# Patient Record
Sex: Male | Born: 1960 | Race: Black or African American | Hispanic: No | Marital: Single | Smoking: Never smoker
Health system: Southern US, Community
[De-identification: ages and names within clinical notes are randomized; demographics above are authoritative.]

## PROBLEM LIST (undated history)

## (undated) ENCOUNTER — Ambulatory Visit (HOSPITAL_COMMUNITY): Admission: EM | Payer: Medicare Other | Source: Home / Self Care

## (undated) DIAGNOSIS — K449 Diaphragmatic hernia without obstruction or gangrene: Secondary | ICD-10-CM

## (undated) DIAGNOSIS — I639 Cerebral infarction, unspecified: Secondary | ICD-10-CM

## (undated) DIAGNOSIS — E785 Hyperlipidemia, unspecified: Secondary | ICD-10-CM

## (undated) DIAGNOSIS — I10 Essential (primary) hypertension: Secondary | ICD-10-CM

## (undated) DIAGNOSIS — F32A Depression, unspecified: Secondary | ICD-10-CM

## (undated) DIAGNOSIS — K219 Gastro-esophageal reflux disease without esophagitis: Secondary | ICD-10-CM

## (undated) DIAGNOSIS — K5792 Diverticulitis of intestine, part unspecified, without perforation or abscess without bleeding: Secondary | ICD-10-CM

## (undated) DIAGNOSIS — C801 Malignant (primary) neoplasm, unspecified: Secondary | ICD-10-CM

## (undated) DIAGNOSIS — F329 Major depressive disorder, single episode, unspecified: Secondary | ICD-10-CM

## (undated) DIAGNOSIS — F319 Bipolar disorder, unspecified: Secondary | ICD-10-CM

## (undated) DIAGNOSIS — Z973 Presence of spectacles and contact lenses: Secondary | ICD-10-CM

## (undated) DIAGNOSIS — D649 Anemia, unspecified: Secondary | ICD-10-CM

## (undated) DIAGNOSIS — F840 Autistic disorder: Secondary | ICD-10-CM

## (undated) DIAGNOSIS — R569 Unspecified convulsions: Secondary | ICD-10-CM

## (undated) DIAGNOSIS — N183 Chronic kidney disease, stage 3 unspecified: Secondary | ICD-10-CM

## (undated) DIAGNOSIS — D49519 Neoplasm of unspecified behavior of unspecified kidney: Secondary | ICD-10-CM

## (undated) DIAGNOSIS — N529 Male erectile dysfunction, unspecified: Secondary | ICD-10-CM

## (undated) DIAGNOSIS — F419 Anxiety disorder, unspecified: Secondary | ICD-10-CM

## (undated) DIAGNOSIS — I509 Heart failure, unspecified: Secondary | ICD-10-CM

## (undated) DIAGNOSIS — I129 Hypertensive chronic kidney disease with stage 1 through stage 4 chronic kidney disease, or unspecified chronic kidney disease: Secondary | ICD-10-CM

## (undated) DIAGNOSIS — M1990 Unspecified osteoarthritis, unspecified site: Secondary | ICD-10-CM

## (undated) DIAGNOSIS — E079 Disorder of thyroid, unspecified: Secondary | ICD-10-CM

## (undated) DIAGNOSIS — T7840XA Allergy, unspecified, initial encounter: Secondary | ICD-10-CM

## (undated) DIAGNOSIS — G4730 Sleep apnea, unspecified: Secondary | ICD-10-CM

## (undated) HISTORY — DX: Unspecified convulsions: R56.9

## (undated) HISTORY — DX: Chronic kidney disease, stage 3 unspecified: N18.30

## (undated) HISTORY — DX: Hyperlipidemia, unspecified: E78.5

## (undated) HISTORY — DX: Unspecified osteoarthritis, unspecified site: M19.90

## (undated) HISTORY — PX: COLONOSCOPY: SHX174

## (undated) HISTORY — DX: Major depressive disorder, single episode, unspecified: F32.9

## (undated) HISTORY — DX: Gastro-esophageal reflux disease without esophagitis: K21.9

## (undated) HISTORY — DX: Cerebral infarction, unspecified: I63.9

## (undated) HISTORY — DX: Sleep apnea, unspecified: G47.30

## (undated) HISTORY — DX: Male erectile dysfunction, unspecified: N52.9

## (undated) HISTORY — DX: Anxiety disorder, unspecified: F41.9

## (undated) HISTORY — DX: Allergy, unspecified, initial encounter: T78.40XA

## (undated) HISTORY — DX: Depression, unspecified: F32.A

## (undated) HISTORY — DX: Presence of spectacles and contact lenses: Z97.3

## (undated) HISTORY — PX: KIDNEY SURGERY: SHX687

## (undated) HISTORY — DX: Diaphragmatic hernia without obstruction or gangrene: K44.9

## (undated) HISTORY — DX: Disorder of thyroid, unspecified: E07.9

## (undated) HISTORY — DX: Hypertensive chronic kidney disease with stage 1 through stage 4 chronic kidney disease, or unspecified chronic kidney disease: I12.9

---

## 2010-08-18 ENCOUNTER — Emergency Department (HOSPITAL_COMMUNITY): Admission: EM | Admit: 2010-08-18 | Discharge: 2010-08-18 | Payer: Self-pay | Admitting: Emergency Medicine

## 2011-02-17 LAB — URINALYSIS, ROUTINE W REFLEX MICROSCOPIC
Bilirubin Urine: NEGATIVE
Leukocytes, UA: NEGATIVE
Nitrite: NEGATIVE
Specific Gravity, Urine: 1.042 — ABNORMAL HIGH (ref 1.005–1.030)
pH: 5.5 (ref 5.0–8.0)

## 2011-02-17 LAB — URINE MICROSCOPIC-ADD ON

## 2011-02-17 LAB — POCT I-STAT, CHEM 8
BUN: 10 mg/dL (ref 6–23)
Creatinine, Ser: 0.9 mg/dL (ref 0.4–1.5)
Potassium: 3.7 mEq/L (ref 3.5–5.1)
Sodium: 143 mEq/L (ref 135–145)

## 2011-02-17 LAB — GLUCOSE, CAPILLARY: Glucose-Capillary: 437 mg/dL — ABNORMAL HIGH (ref 70–99)

## 2011-07-25 ENCOUNTER — Emergency Department (HOSPITAL_COMMUNITY): Payer: Self-pay

## 2011-07-25 ENCOUNTER — Inpatient Hospital Stay (HOSPITAL_COMMUNITY)
Admission: EM | Admit: 2011-07-25 | Discharge: 2011-08-06 | DRG: 391 | Disposition: A | Discharge status: Home / Self Care | Payer: Self-pay | Attending: Surgery | Admitting: Surgery

## 2011-07-25 DIAGNOSIS — N289 Disorder of kidney and ureter, unspecified: Secondary | ICD-10-CM | POA: Diagnosis present

## 2011-07-25 DIAGNOSIS — Z794 Long term (current) use of insulin: Secondary | ICD-10-CM

## 2011-07-25 DIAGNOSIS — K5732 Diverticulitis of large intestine without perforation or abscess without bleeding: Principal | ICD-10-CM | POA: Diagnosis present

## 2011-07-25 DIAGNOSIS — N2889 Other specified disorders of kidney and ureter: Secondary | ICD-10-CM

## 2011-07-25 DIAGNOSIS — R17 Unspecified jaundice: Secondary | ICD-10-CM | POA: Diagnosis present

## 2011-07-25 DIAGNOSIS — E041 Nontoxic single thyroid nodule: Secondary | ICD-10-CM | POA: Diagnosis present

## 2011-07-25 DIAGNOSIS — D649 Anemia, unspecified: Secondary | ICD-10-CM | POA: Diagnosis present

## 2011-07-25 DIAGNOSIS — R197 Diarrhea, unspecified: Secondary | ICD-10-CM | POA: Diagnosis present

## 2011-07-25 DIAGNOSIS — I10 Essential (primary) hypertension: Secondary | ICD-10-CM | POA: Diagnosis present

## 2011-07-25 DIAGNOSIS — R112 Nausea with vomiting, unspecified: Secondary | ICD-10-CM | POA: Diagnosis present

## 2011-07-25 DIAGNOSIS — E1310 Other specified diabetes mellitus with ketoacidosis without coma: Secondary | ICD-10-CM | POA: Diagnosis present

## 2011-07-25 DIAGNOSIS — E876 Hypokalemia: Secondary | ICD-10-CM | POA: Diagnosis not present

## 2011-07-25 HISTORY — DX: Essential (primary) hypertension: I10

## 2011-07-25 LAB — CBC
Hemoglobin: 14.3 g/dL (ref 13.0–17.0)
MCH: 31.3 pg (ref 26.0–34.0)
MCV: 86 fL (ref 78.0–100.0)
RBC: 4.57 MIL/uL (ref 4.22–5.81)

## 2011-07-25 LAB — DIFFERENTIAL
Lymphs Abs: 2.4 10*3/uL (ref 0.7–4.0)
Monocytes Relative: 7 % (ref 3–12)
Neutro Abs: 15.4 10*3/uL — ABNORMAL HIGH (ref 1.7–7.7)
Neutrophils Relative %: 80 % — ABNORMAL HIGH (ref 43–77)

## 2011-07-25 LAB — BASIC METABOLIC PANEL
CO2: 14 mEq/L — ABNORMAL LOW (ref 19–32)
Calcium: 10.4 mg/dL (ref 8.4–10.5)
Creatinine, Ser: 1.06 mg/dL (ref 0.50–1.35)
Glucose, Bld: 456 mg/dL — ABNORMAL HIGH (ref 70–99)
Sodium: 133 mEq/L — ABNORMAL LOW (ref 135–145)

## 2011-07-26 ENCOUNTER — Encounter (HOSPITAL_COMMUNITY): Payer: Self-pay | Admitting: Radiology

## 2011-07-26 DIAGNOSIS — E119 Type 2 diabetes mellitus without complications: Secondary | ICD-10-CM

## 2011-07-26 DIAGNOSIS — K5732 Diverticulitis of large intestine without perforation or abscess without bleeding: Secondary | ICD-10-CM

## 2011-07-26 LAB — GLUCOSE, CAPILLARY
Glucose-Capillary: 387 mg/dL — ABNORMAL HIGH (ref 70–99)
Glucose-Capillary: 316 mg/dL — ABNORMAL HIGH (ref 70–99)
Glucose-Capillary: 217 mg/dL — ABNORMAL HIGH (ref 70–99)
Glucose-Capillary: 196 mg/dL — ABNORMAL HIGH (ref 70–99)
Glucose-Capillary: 194 mg/dL — ABNORMAL HIGH (ref 70–99)
Glucose-Capillary: 160 mg/dL — ABNORMAL HIGH (ref 70–99)
Glucose-Capillary: 135 mg/dL — ABNORMAL HIGH (ref 70–99)

## 2011-07-26 LAB — LIPASE, BLOOD: Lipase: 19 U/L (ref 11–59)

## 2011-07-26 LAB — CBC
MCH: 29.2 pg (ref 26.0–34.0)
MCHC: 34 g/dL (ref 30.0–36.0)
MCV: 85.9 fL (ref 78.0–100.0)
Platelets: 218 10*3/uL (ref 150–400)
RDW: 11.6 % (ref 11.5–15.5)
WBC: 13.1 10*3/uL — ABNORMAL HIGH (ref 4.0–10.5)

## 2011-07-26 LAB — BASIC METABOLIC PANEL
CO2: 25 mEq/L (ref 19–32)
Calcium: 9.5 mg/dL (ref 8.4–10.5)
Chloride: 105 mEq/L (ref 96–112)
Glucose, Bld: 132 mg/dL — ABNORMAL HIGH (ref 70–99)
Sodium: 138 mEq/L (ref 135–145)

## 2011-07-26 LAB — URINE MICROSCOPIC-ADD ON

## 2011-07-26 LAB — COMPREHENSIVE METABOLIC PANEL
BUN: 13 mg/dL (ref 6–23)
CO2: 24 mEq/L (ref 19–32)
Calcium: 9.6 mg/dL (ref 8.4–10.5)
Creatinine, Ser: 0.79 mg/dL (ref 0.50–1.35)
GFR calc Af Amer: >60 mL/min (ref >60)
GFR calc non Af Amer: >60 mL/min (ref >60)
Glucose, Bld: 220 mg/dL — ABNORMAL HIGH (ref 70–99)
Total Bilirubin: 1.6 mg/dL — ABNORMAL HIGH (ref 0.3–1.2)

## 2011-07-26 LAB — URINALYSIS, ROUTINE W REFLEX MICROSCOPIC
Ketones, ur: >80 mg/dL — AB
Ketones, ur: >80 mg/dL — AB
Leukocytes, UA: NEGATIVE
Leukocytes, UA: NEGATIVE
Nitrite: NEGATIVE
Nitrite: NEGATIVE
Protein, ur: 30 mg/dL — AB
Protein, ur: 30 mg/dL — AB

## 2011-07-26 LAB — HEPATIC FUNCTION PANEL
ALT: 9 U/L (ref 0–53)
Albumin: 3.7 g/dL (ref 3.5–5.2)
Alkaline Phosphatase: 122 U/L — ABNORMAL HIGH (ref 39–117)
Total Bilirubin: 2 mg/dL — ABNORMAL HIGH (ref 0.3–1.2)

## 2011-07-26 LAB — DIFFERENTIAL
Eosinophils Absolute: 0.1 10*3/uL (ref 0.0–0.7)
Eosinophils Relative: 1 % (ref 0–5)
Lymphs Abs: 1.2 10*3/uL (ref 0.7–4.0)
Monocytes Absolute: 0.7 10*3/uL (ref 0.1–1.0)

## 2011-07-26 LAB — BILIRUBIN, FRACTIONATED(TOT/DIR/INDIR): Indirect Bilirubin: 1.4 mg/dL — ABNORMAL HIGH (ref 0.3–0.9)

## 2011-07-26 LAB — LACTATE DEHYDROGENASE: LDH: 249 U/L (ref 94–250)

## 2011-07-26 MED ORDER — IOHEXOL 300 MG/ML  SOLN
100.0000 mL | Freq: Once | INTRAMUSCULAR | Status: AC | PRN
  Administered 2011-07-26: 100 mL via INTRAVENOUS

## 2011-07-27 LAB — PHOSPHORUS: Phosphorus: 2.7 mg/dL (ref 2.3–4.6)

## 2011-07-27 LAB — HEMOGLOBIN A1C
Hgb A1c MFr Bld: 14.1 % — ABNORMAL HIGH (ref <5.7)
Mean Plasma Glucose: 358 mg/dL — ABNORMAL HIGH (ref <117)

## 2011-07-27 LAB — GLUCOSE, CAPILLARY
Glucose-Capillary: 186 mg/dL — ABNORMAL HIGH (ref 70–99)
Glucose-Capillary: 202 mg/dL — ABNORMAL HIGH (ref 70–99)
Glucose-Capillary: 207 mg/dL — ABNORMAL HIGH (ref 70–99)
Glucose-Capillary: 159 mg/dL — ABNORMAL HIGH (ref 70–99)

## 2011-07-27 LAB — CBC
MCV: 87.1 fL (ref 78.0–100.0)
Platelets: 201 10*3/uL (ref 150–400)
RBC: 3.94 MIL/uL — ABNORMAL LOW (ref 4.22–5.81)
RDW: 11.6 % (ref 11.5–15.5)
WBC: 15.2 10*3/uL — ABNORMAL HIGH (ref 4.0–10.5)

## 2011-07-27 LAB — BASIC METABOLIC PANEL WITH GFR
BUN: 8 mg/dL (ref 6–23)
CO2: 20 meq/L (ref 19–32)
Calcium: 9.2 mg/dL (ref 8.4–10.5)
Chloride: 105 meq/L (ref 96–112)
Creatinine, Ser: 0.66 mg/dL (ref 0.50–1.35)
GFR calc Af Amer: >60 mL/min
GFR calc non Af Amer: >60 mL/min
Glucose, Bld: 220 mg/dL — ABNORMAL HIGH (ref 70–99)
Potassium: 3.4 meq/L — ABNORMAL LOW (ref 3.5–5.1)
Sodium: 137 meq/L (ref 135–145)

## 2011-07-27 LAB — HAPTOGLOBIN: Haptoglobin: 418 mg/dL — ABNORMAL HIGH (ref 30–200)

## 2011-07-27 LAB — MAGNESIUM: Magnesium: 2.3 mg/dL (ref 1.5–2.5)

## 2011-07-28 LAB — GLUCOSE, CAPILLARY
Glucose-Capillary: 115 mg/dL — ABNORMAL HIGH (ref 70–99)
Glucose-Capillary: 223 mg/dL — ABNORMAL HIGH (ref 70–99)
Glucose-Capillary: 144 mg/dL — ABNORMAL HIGH (ref 70–99)

## 2011-07-28 LAB — CBC
HCT: 32.7 % — ABNORMAL LOW (ref 39.0–52.0)
Hemoglobin: 11.4 g/dL — ABNORMAL LOW (ref 13.0–17.0)
MCV: 86.7 fL (ref 78.0–100.0)
Platelets: 214 10*3/uL (ref 150–400)
RBC: 3.77 MIL/uL — ABNORMAL LOW (ref 4.22–5.81)
WBC: 9.7 10*3/uL (ref 4.0–10.5)

## 2011-07-28 LAB — RETICULOCYTES: Retic Count, Absolute: 30.2 10*3/uL (ref 19.0–186.0)

## 2011-07-28 LAB — HEPATIC FUNCTION PANEL
Alkaline Phosphatase: 100 U/L (ref 39–117)
Indirect Bilirubin: 0.6 mg/dL (ref 0.3–0.9)
Total Protein: 6.5 g/dL (ref 6.0–8.3)

## 2011-07-28 LAB — BASIC METABOLIC PANEL
CO2: 22 mEq/L (ref 19–32)
Calcium: 9.1 mg/dL (ref 8.4–10.5)
Creatinine, Ser: 0.6 mg/dL (ref 0.50–1.35)

## 2011-07-28 LAB — PROTIME-INR: Prothrombin Time: 17 seconds — ABNORMAL HIGH (ref 11.6–15.2)

## 2011-07-29 LAB — BASIC METABOLIC PANEL
Calcium: 8.8 mg/dL (ref 8.4–10.5)
Creatinine, Ser: 0.54 mg/dL (ref 0.50–1.35)
GFR calc Af Amer: >60 mL/min (ref >60)

## 2011-07-29 LAB — GLUCOSE, CAPILLARY
Glucose-Capillary: 102 mg/dL — ABNORMAL HIGH (ref 70–99)
Glucose-Capillary: 125 mg/dL — ABNORMAL HIGH (ref 70–99)
Glucose-Capillary: 132 mg/dL — ABNORMAL HIGH (ref 70–99)
Glucose-Capillary: 156 mg/dL — ABNORMAL HIGH (ref 70–99)

## 2011-07-29 LAB — CBC
MCH: 29.9 pg (ref 26.0–34.0)
MCHC: 34.9 g/dL (ref 30.0–36.0)
MCV: 85.6 fL (ref 78.0–100.0)
Platelets: 218 10*3/uL (ref 150–400)
RDW: 11.5 % (ref 11.5–15.5)
WBC: 8 10*3/uL (ref 4.0–10.5)

## 2011-07-30 LAB — GLUCOSE, CAPILLARY
Glucose-Capillary: 164 mg/dL — ABNORMAL HIGH (ref 70–99)
Glucose-Capillary: 115 mg/dL — ABNORMAL HIGH (ref 70–99)
Glucose-Capillary: 173 mg/dL — ABNORMAL HIGH (ref 70–99)

## 2011-07-30 LAB — BASIC METABOLIC PANEL
CO2: 24 mEq/L (ref 19–32)
Chloride: 103 mEq/L (ref 96–112)
Creatinine, Ser: 0.55 mg/dL (ref 0.50–1.35)
Sodium: 136 mEq/L (ref 135–145)

## 2011-07-31 LAB — GLUCOSE, CAPILLARY
Glucose-Capillary: 142 mg/dL — ABNORMAL HIGH (ref 70–99)
Glucose-Capillary: 172 mg/dL — ABNORMAL HIGH (ref 70–99)
Glucose-Capillary: 191 mg/dL — ABNORMAL HIGH (ref 70–99)
Glucose-Capillary: 209 mg/dL — ABNORMAL HIGH (ref 70–99)

## 2011-08-01 LAB — GLUCOSE, CAPILLARY
Glucose-Capillary: 104 mg/dL — ABNORMAL HIGH (ref 70–99)
Glucose-Capillary: 121 mg/dL — ABNORMAL HIGH (ref 70–99)
Glucose-Capillary: 136 mg/dL — ABNORMAL HIGH (ref 70–99)
Glucose-Capillary: 206 mg/dL — ABNORMAL HIGH (ref 70–99)
Glucose-Capillary: 244 mg/dL — ABNORMAL HIGH (ref 70–99)

## 2011-08-01 LAB — CBC
Hemoglobin: 11.3 g/dL — ABNORMAL LOW (ref 13.0–17.0)
MCH: 30.2 pg (ref 26.0–34.0)
MCHC: 35 g/dL (ref 30.0–36.0)
Platelets: 280 10*3/uL (ref 150–400)

## 2011-08-01 LAB — CULTURE, BLOOD (ROUTINE X 2): Culture  Setup Time: 201208211224

## 2011-08-02 ENCOUNTER — Inpatient Hospital Stay (HOSPITAL_COMMUNITY): Payer: Self-pay

## 2011-08-02 LAB — GLUCOSE, CAPILLARY
Glucose-Capillary: 187 mg/dL — ABNORMAL HIGH (ref 70–99)
Glucose-Capillary: 287 mg/dL — ABNORMAL HIGH (ref 70–99)

## 2011-08-02 MED ORDER — IOHEXOL 300 MG/ML  SOLN
80.0000 mL | Freq: Once | INTRAMUSCULAR | Status: AC | PRN
  Administered 2011-08-02: 80 mL via INTRAVENOUS

## 2011-08-03 ENCOUNTER — Inpatient Hospital Stay (HOSPITAL_COMMUNITY): Payer: Self-pay

## 2011-08-03 LAB — CBC
MCHC: 35.8 g/dL (ref 30.0–36.0)
Platelets: 388 10*3/uL (ref 150–400)
RDW: 11.3 % — ABNORMAL LOW (ref 11.5–15.5)
WBC: 6.4 10*3/uL (ref 4.0–10.5)

## 2011-08-03 LAB — BASIC METABOLIC PANEL
Chloride: 96 mEq/L (ref 96–112)
GFR calc Af Amer: >60 mL/min (ref >60)
GFR calc non Af Amer: >60 mL/min (ref >60)
Potassium: 3.8 mEq/L (ref 3.5–5.1)
Sodium: 134 mEq/L — ABNORMAL LOW (ref 135–145)

## 2011-08-03 LAB — GLUCOSE, CAPILLARY
Glucose-Capillary: 251 mg/dL — ABNORMAL HIGH (ref 70–99)
Glucose-Capillary: 169 mg/dL — ABNORMAL HIGH (ref 70–99)
Glucose-Capillary: 229 mg/dL — ABNORMAL HIGH (ref 70–99)
Glucose-Capillary: 275 mg/dL — ABNORMAL HIGH (ref 70–99)

## 2011-08-03 MED ORDER — GADOBENATE DIMEGLUMINE 529 MG/ML IV SOLN: 20.0000 mL | Freq: Once | INTRAVENOUS | Status: DC

## 2011-08-04 ENCOUNTER — Inpatient Hospital Stay (HOSPITAL_COMMUNITY): Payer: Self-pay

## 2011-08-04 LAB — APTT: aPTT: 38 seconds — ABNORMAL HIGH (ref 24–37)

## 2011-08-04 LAB — GLUCOSE, CAPILLARY
Glucose-Capillary: 218 mg/dL — ABNORMAL HIGH (ref 70–99)
Glucose-Capillary: 174 mg/dL — ABNORMAL HIGH (ref 70–99)
Glucose-Capillary: 220 mg/dL — ABNORMAL HIGH (ref 70–99)

## 2011-08-04 LAB — CREATININE, SERUM
GFR calc Af Amer: >60 mL/min (ref >60)
GFR calc non Af Amer: >60 mL/min (ref >60)

## 2011-08-04 MED ORDER — IOHEXOL 300 MG/ML  SOLN
80.0000 mL | Freq: Once | INTRAMUSCULAR | Status: AC | PRN
  Administered 2011-08-04: 80 mL via INTRAVENOUS

## 2011-08-05 ENCOUNTER — Inpatient Hospital Stay (HOSPITAL_COMMUNITY): Payer: Self-pay

## 2011-08-05 ENCOUNTER — Other Ambulatory Visit (HOSPITAL_COMMUNITY): Payer: Self-pay

## 2011-08-05 LAB — GLUCOSE, CAPILLARY
Glucose-Capillary: 269 mg/dL — ABNORMAL HIGH (ref 70–99)
Glucose-Capillary: 185 mg/dL — ABNORMAL HIGH (ref 70–99)
Glucose-Capillary: 159 mg/dL — ABNORMAL HIGH (ref 70–99)

## 2011-08-05 NOTE — Consult Note (Signed)
NAME:  Brandon [name], Brandon [name] NO.:  [id number]  MEDICAL RECORD NO.:  [id number]  LOCATION:  [id number]                         FACILITY:  [facility]  PHYSICIAN:  Sashay Felling I. Gaynelle [name], Brandon HolmesDATE OF BIRTH:  28-Jun-1961  DATE OF CONSULTATION:  08/03/2011 DATE OF DISCHARGE:                                CONSULTATION   TIME:  2130 hours.  SUBJECTIVE:  Brandon [name] is a 50 year old insulin-dependent diabetic, admitted to the hospital on July 26, 2011, with a ruptured diverticulitis, found to be a microperforation.  He has been managed conservatively, with Cipro, and Flagyl, and IV hydration and support. The patient has a history of a CT scan 1 year ago, as a stone protocol CT, complaining of back pain and urinary frequency.  No stones identified, but the patient was found to have bilateral renal abnormalities, and a pleural-based nodule.  No followup was obtained, and the patient now has CT scan on July 26, 2011, showing a left renal posterior 3.6 x 3.0 heterogeneous mass, which was originally described as "difficult to visualize" in 2011 CT.  On the right side, he has a 2.71 x 2.0 hypodense mass.  During the course, the patient's hospitalization, he underwent followup abdominal CT for his diverticulitis, and chest evaluation shows that the patient has a pleural-based nodule, measuring 10.4 x 4.2 mm on the right side, and was noted to be 9.2 x 4.6 in 2011.  However, repeat examination of the CT abdomen, chest films from 2011, show that the mass is unchanged at 10.4 x 4.2.  In addition, the patient had two 3-mm right-sided pleural-based nodules, which are also unchanged from the 2011 CT.  Followup CT on August 02, 2011, showed that the right renal mass was 2.8 x 2.5 mass in the right mid renal cortex, with arterial enhancement on followup MRI.  On the left side, he has a 13.2 x 2.7 cm medial left lower pole mass with arterial enhancement, and also a cystic component.  SOCIAL  HISTORY:  The patient moved to [place] from [place] in [place].  He is unemployed, has no physician.  He buys his insulin over the counter, and he uses p.r.n. amounts.  The patient lives at home with fiancee.  He has 3 children.  Tobacco is none.  Alcohol is occasional.  REVIEW OF SYSTEMS:  Significant for 50-pound weight loss in 8 months. Review of systems is negative for vision change, headache, loss of consciousness, breathing difficulty, chest pain, cough, gross hematuria, flank pain, skin changes, or neurologic abnormalities.  PAST MEDICAL HISTORY:  Significant for hypertension and diabetes.  MEDICATIONS:  Include Humulin 70/30, 35 units in the morning and 25 in the evening.  ALLERGIES:  None known.  FAMILY HISTORY:  Diabetes, brain aneurysm.  Mother died secondary to aspiration pneumonia.  PHYSICAL EXAMINATION:  GENERAL:  Well-developed African American male, in no acute distress. VITAL SIGNS:  Blood pressure 117/61. NECK:  Supple, nontender.  No nodes. CHEST:  Clear to P and A. ABDOMEN:  Soft, decreased bowel sounds without masses.  There is tenderness to suprapubic palpation. GENITOURINARY:  Normal male external genitalia. EXTREMITIES:  No cyanosis, no edema. PSYCHIATRIC:  Normal psychologic evaluation.  Laboratories show urinalysis  with 0-2 white blood cells, 0-2 red blood cells, and 30 mg of protein.  Laboratories show creatinine 0.74 with GFR greater than 60.  Hemoglobin 13.5.  Platelets 388,000.  ASSESSMENT:  Mr. Gribben is a 50 year old insulin-dependent diabetic, with bilateral renal masses.  The renal masses appeared to be enlarging. The chest nodules appeared to be pleural based, and are unchanged in size and location.  I discussed the case in depth with Radiology, and recommendations would be as follows: 1. Keep the patient in hospital, and proceed with chest CT in a.m.  If     there are no parenchymal nodules, then would proceed to bilateral      renal biopsies percutaneously for diagnosis.  Concern would be for     spreading cancer, or causing bleeding, but diagnosis is important,     because it would be rare to have bilateral synchronous renal     cancers, or spread of cancer from 1 kidney to the other, without     lymph nodes being present.  Therefore, will follow tomorrow with chest CT, and will discuss possible percutaneous biopsy with special procedures.  The patient prefers to stay in-house until biopsies are performed.     Brandon [name], M.D.     SIT/MEDQ  D:  08/03/2011  T:  08/03/2011  Job:  [id number]  Electronically Signed by Brandon [name] M.D. on 08/05/2011 03:12:57 PM

## 2011-08-05 NOTE — H&P (Signed)
NAME:  Brandon [name], Brandon [name] NO.:  [id number]  MEDICAL RECORD NO.:  [id number]  LOCATION:  [facility]                         FACILITY:  [facility]  PHYSICIAN:  Imogene Burn. Georgette Dover, M.D. DATE OF BIRTH:  07-23-1961  DATE OF ADMISSION:  07/25/2011 DATE OF DISCHARGE:                             HISTORY & PHYSICAL   PRIMARY CARE PHYSICIAN:  None.  Previously seen out of town.  CHIEF COMPLAINT:  Abdominal pain.  HISTORY OF PRESENT ILLNESS:  [name] is a 50 year old African American gentleman who began describing his history approximately 4 days ago with some significant fatigue.  He had been out in about at a local event and thought perhaps that he had just done too much activity and was fatigued, however, by the end of the first 48 hours of his onset of fatigue he started developing some fever and chills as well as some lower abdominal discomfort.  He also reports an onset of nausea but was able to tolerate some liquids and though he stated everything that he drank went right through him.  The patient had multiple bouts of diarrhea.  Denies any hematochezia.  He has an underlying history of diabetes and hypertension but not been checking his blood sugars, in fact he has been out of his medicine for some time due to not having an employment at the present time and therefore is unsure what his sugars have been doing recently.  He states that over the recent times, he has also lost about 50 pounds intentionally to aid in his general health and is no longer using any blood pressure medications that he was previously on.  He denies any prior history of intestinal disorders.  He denies any history of chronic constipation.  States his bowel movements are typically regular and soft.  He presented to the emergency department due to this complaint of discomfort and workup in the emergency department found multiple issues including significant uncontrolled diabetes, diabetic ketoacidosis,  and diverticulitis with a micro perforation.  We have been asked to evaluate the patient for his abdominal issues.  The Medical Team has been consulted to manage his diabetes.  PAST MEDICAL HISTORY: 1. Diabetes mellitus. 2. Hypertension. 3. Obesity.  SURGICAL HISTORY:  Negative.  FAMILY HISTORY:  The patient's father passed away from complications of diabetes as well as a brain aneurysm.  His mother passed away I believe from aspiration pneumonia, diabetes and cardiovascular disease is very prominent in his extended family.  SOCIAL HISTORY:  The patient is currently unemployed and was actually planning for a job interview yesterday when the onset of symptoms worsened.  He is engaged and his fiancee is at the bedside with him.  He does have 1 child with his fiancee.  He denies any tobacco or illicit drug use, has an occasional alcoholic beverage.  DRUG ALLERGIES:  No known drug or latex allergies.  MEDICATIONS:  Insulin which he has not been taking.  REVIEW OF SYSTEMS:  Please see history of present illness for pertinent findings, otherwise, complete 12 systems reviewed were negative.  PHYSICAL EXAMINATION:  VITAL SIGNS:  A temperature of 99.2, heart rate of 84, respiratory rate of 20, blood pressure 117/72, oxygen saturation  anywhere from 95-98% on room air. GENERAL:  The patient is a 50 year old obese African American gentleman who is in mild distress. ENT:  Unremarkable. NECK:  Supple without lymphadenopathy.  Trachea is midline.  No thyromegaly or masses. LUNGS:  Clear to auscultation.  No wheezes, rhonchi or rales.  Normal respiratory effort without use of accessory muscles. HEART:  Regular rate and rhythm.  No murmurs, gallops or rubs.  Carotids 2+ and brisk without bruit.  Peripheral pulses intact and symmetrical. ABDOMEN:  No surgical scars.  No obvious hernias.  No organomegaly.  The patient is tender in the left lower quadrant with no peritonitis.  No mass effect  is appreciated. RECTAL:  Deferred. GENITOURINARY:  The patient's testes are bilaterally descended.  No external lesions are seen. EXTREMITIES:  Good active range of motion in all extremities without crepitus or pain.  Muscle strength and tone without atrophy. SKIN:  Otherwise, warm and dry with good turgor.  No rashes, lesions, nodules or jaundice. NEUROLOGIC:  The patient is alert and oriented x3.  Appropriate affect.  DIAGNOSTICS:  CBC today shows a white blood cell count of 19.3, hemoglobin of 14.3, hematocrit of 39.3, platelet count of 248. Metabolic panel shows a sodium of 133, potassium of 4.3, chloride of 92, CO2 of 14, BUN of 13, creatinine of 1.0, glucose of 456.  LFTs show an elevated bilirubin of 2.0, alk phosphatase of 122, AST and ALT of 9, albumin of 3.7.  Lactic acid level normal at 1.0.  Urinalysis essentially negative for pyuria.  IMAGING:  CT scan today shows 2 diverticulitis along the proximal sigmoid colon which contained microperforation into the mesenteric side, small amount of free air noted within the mesentery and trace free fluid noted in the left lower quadrant but no distinct abscess formation. Incidentally, noted a complex multicystic mass noted in both kidneys reportedly increased in size since 2011.  IMPRESSION: 1. Acute sigmoid diverticulitis with micro perforation - contained. 2. Uncontrolled diabetes mellitus. 3. Diabetic ketoacidosis  PLAN:  The patient has already been initiated on IV fluids and insulin drip at present time by the Medical Service.  We will admit the patient for IV antibiotics and bowel rest and management of his diverticulitis. The patient understands that if he does not improve clinically with antibiotics and bowel rest with conservative management that surgical intervention including partial colectomy and colostomy is a distinct possibility.     Ascencion Dike, PA-C   ______________________________ Imogene Burn. Georgette Dover,  M.D.    KB/MEDQ  D:  07/26/2011  T:  07/26/2011  Job:  [id number]  Electronically Signed by Ascencion Dike  on 08/01/2011 12:02:49 PM Electronically Signed by Donnie Mesa M.D. on 08/05/2011 05:05:09 PM

## 2011-08-06 ENCOUNTER — Inpatient Hospital Stay (HOSPITAL_COMMUNITY): Payer: Self-pay

## 2011-08-06 LAB — GLUCOSE, CAPILLARY: Glucose-Capillary: 311 mg/dL — ABNORMAL HIGH (ref 70–99)

## 2011-08-09 LAB — GLUCOSE, CAPILLARY: Glucose-Capillary: 193 mg/dL — ABNORMAL HIGH (ref 70–99)

## 2011-08-10 ENCOUNTER — Ambulatory Visit
Admit: 2011-08-10 | Discharge: 2011-08-10 | Disposition: A | Discharge status: Home / Self Care | Payer: Self-pay | Attending: Urology | Admitting: Urology

## 2011-08-30 ENCOUNTER — Other Ambulatory Visit (HOSPITAL_COMMUNITY): Payer: Self-pay | Admitting: Interventional Radiology

## 2011-08-30 DIAGNOSIS — N2889 Other specified disorders of kidney and ureter: Secondary | ICD-10-CM

## 2011-09-01 NOTE — Discharge Summary (Signed)
  NAME:  ALYSSA, DEUTSCHMAN NO.:  [id number]  MEDICAL RECORD NO.:  [id number]  LOCATION:  [id number]                         FACILITY:  [facility]  PHYSICIAN:  Haywood Lasso, M.D.DATE OF BIRTH:  Nov 09, 1961  DATE OF ADMISSION:  07/25/2011 DATE OF DISCHARGE:  08/06/2011                              DISCHARGE SUMMARY   HISTORY OF PRESENT ILLNESS:  Mr. Linares is a 50 year old gentleman who developed several days of abdominal pain.  He has underlying history of diabetes, hypertension, and obesity.  He presented to the emergency department and workup showed a significantly elevated glucose and abnormal labs consistent with diabetic ketoacidosis.  However, he subsequently also found evidence of sigmoid diverticulitis with evidence of microperforation that is contained.  Decision was made to admit the patient for IV antibiotics and bowel rest with the plan for the Hospitalist Service to manage his diabetes and diabetic ketoacidosis.  [place] COURSE:  The patient was admitted on July 25, 2011.  He made dramatic improvement in the first couple of days of his diabetic ketoacidosis and diabetes management.  He was placed on IV antibiotics and actually waxed and waned for several days with regard to discomfort.  Since he did not completely improve on conservative management, a repeat CT scan was ordered where his diverticulitis was found to be stable, but he was also found to have evidence of a multicystic mass in the kidneys that was seen on the original CT, but now felt to be more suspicious for possible neoplasm.  An MRI was ordered and again found this lesion to be suspicious for renal cell carcinoma.  The Urology Service was consulted and recommended continued workup of this mass on an outpatient basis by means of an interventional radiology biopsy.  He gradually improved from his diverticulitis standpoint.  Subsequently, a CT of the chest was ordered as there  was also apparently found tiny pulmonary nodules.  These were nonspecific. Once the patient was tolerating regular diet and having good bowel function with minimal pain, he was felt to be appropriate for discharge home.  All followup plans were discussed with the patient.  DISCHARGE DIAGNOSES: 1. Sigmoid diverticulitis with microperforation - contained and     improving. 2. 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 3. Incidental finding of lung nodules of uncertain etiology. 4. Diabetes mellitus with an acutely resolved diabetic ketoacidosis.  DISCHARGE MEDICATIONS:  The patient will be given: 1. Cipro 500 mg twice daily. 2. Flagyl 500 mg 3 times daily. 3. Dilaudid tablets 2 mg q.4 h. as needed for pain. 4. The patient will also will resume his insulin 70/30 60 units twice     daily. 5. Lisinopril 20 mg daily.  He is given instructions to follow up with Dr. Georgette Dover as well as the [facility].     Ascencion Dike, PA-C   ______________________________ Haywood Lasso, M.D.    KB/MEDQ  D:  08/24/2011  T:  08/25/2011  Job:  [id number]  Electronically Signed by Ascencion Dike  on 08/31/2011 03:47:31 PM Electronically Signed by Neldon Mc M.D. on 09/01/2011 06:17:16 AM

## 2011-09-05 DIAGNOSIS — D49519 Neoplasm of unspecified behavior of unspecified kidney: Secondary | ICD-10-CM

## 2011-09-05 HISTORY — DX: Neoplasm of unspecified behavior of unspecified kidney: D49.519

## 2011-09-07 NOTE — Consult Note (Signed)
NAME:  Brandon [name], Brandon [name] NO.:  [id number]  MEDICAL RECORD NO.:  [id number]  LOCATION:  [facility]                         FACILITY:  [facility]  PHYSICIAN:  Ria Bush, MD         DATE OF BIRTH:  Mar 31, 1961  DATE OF CONSULTATION:  07/26/2011 DATE OF DISCHARGE:                                CONSULTATION   PRIMARY CARE DOCTOR:  Unavailable at this time.  CHIEF COMPLAINT:  Abdominal pain.  HISTORY OF PRESENT ILLNESS:  The patient is a 50 year old male with a history of diabetes on insulin who presents with left lower quadrant abdominal pain and is found to have a ruptured diverticulitis.  Three days ago, he reports he started feeling more fatigued and through Saturday and Sunday his fatigue had gotten progressively worse and developed in the left lower quadrant pain.  He also noted that the pain in his left lower quadrant would get worse when he pressed on his belly. He started feeling much more dehydrated and endorses that he was getting fevers, chills, and night sweats and nausea.  He was also having diarrhea.  He came to the [facility] Emergency Room where his vitals were 99.2, 129/69, pulse 111, 20, and 97% on room air.  Through his workup, he was found to have a leukocytosis up to 19.3 with neutrophil predominance, but no bandemia.  His chemistry panel appears to be consistent with DKA with an initial glucose of 456 and a bicarb of 14 with an anion gap of about 27, also with greater than 80 ketonuria and 1000 glucosuria.  He also has an indirect bilirubinemia.  Finally, they performed a CT scan of his abdomen, which showed acute diverticulitis along the proximal sigmoid colon with a perforation on the mesentery side of the colon and small amount of free air noted within the mesentery.  They also noted some complex mostly cystic masses in the kidneys as an incidental finding.  Surgery has been contacted and plan to evaluate him for potential surgery and Internal  Medicine consultation is requested to help deal with his hyperglycemia  Through the ED, he has been given 2 liters of IV fluids and has been started on IV insulin at a rate of 4.2 units per hour after subcutaneous insulin did not control his blood sugars.  He has been started on Zosyn antibiotics at present.  His blood sugars have continued to decline after IV insulin and his most recent fingerstick was 271.  REVIEW OF SYSTEMS:  As above, otherwise negative for vision changes, headache, loss of consciousness, HEENT problems, difficulty breathing, chest pain, cough, blood loss, rashes or other skin changes, or focal neurological deficits.  Review of systems is otherwise positive for dry heaving and polyuria.  PAST MEDICAL HISTORY: 1. Diabetes:  Diagnosed 8 years ago and "stress induced" after his     mother died.  He states that he was on metformin, but this caused a     lot of dry mouth and that he had to start insulin within 60-90     days. 2. Hypertension.  This has now been not an issue after he lost 50     pounds intentionally.  HOME MEDICATIONS:  Humulin 70/30 that he takes 35 in the morning and 25 every evening.  ALLERGIES:  No known drug allergies.  FAMILY HISTORY:  His father had diabetes and passed away from a brain aneurysm and also had bilateral BKAs.  His mother passed 8 years ago in 2004 of what they describe as a supranuclear palsy and aspiration pneumonia.  He has a sister who is healthy.  SOCIAL HISTORY:  He lives at home with his fiancee and has three children.  He is currently unemployed and this has made it difficult to get his fingerstick blood sugar readings and his insulin.  He is looking for job.  He is a never smoker and drinks occasional alcohol and denies any drugs.  PHYSICAL EXAMINATION:  VITAL SIGNS:  His blood pressure is 117/61 and has ranged 91-162 through the ED, pulse 89, 97%, most recent temperature is 99.2. GENERAL:  He is lying in the ED  hospital stretcher, but is easily awoken.  His fiancee is at the bedside.  He is pleasant and does not appear in distress.  He appears minimally ill, but not in extremis. HEENT:  His pupils are equal, round, and reactive to light.  His extraocular muscles are intact.  He has muddy sclerae and potential very trace amount of scleral icterus.  His mouth is moist and normal appearing.  There are no oropharyngeal lesions.  I do not note any sublingual icterus. NECK:  Supple.  There is no cervical lymphadenopathy. LUNGS:  Clear to auscultation bilaterally.  No wheezes, crackles, rales, or rhonchi. HEART:  Regular rate and rhythm with no murmurs or gallops appreciated. ABDOMEN:  Soft.  It is not rigid and is not distended.  He has no tenderness to palpation in his bilateral upper quadrants, nor any hepatosplenomegaly that I can appreciate.  He does have facial grimacing with palpation in his right lower quadrant and worse in his left lower quadrant.  He has positive bowel sounds. EXTREMITIES:  Warm, well-perfused without any cyanosis or clubbing.  His radial pulses are palpable.  There is no bilateral lower extremity edema noted. SKIN:  Warm with no diaphoresis and no appreciable rashes. NEUROLOGICAL:  Grossly nonfocal with cranial nerves II through XII are intact.  He is spontaneously moving all extremities.  He is alert, oriented, and totally conversant.  LABORATORY DATA:  Significant for blood capillary glucose most recently of 271.  His UA shows specific gravity that is concentrated at 1.038, he has greater than 1000 glucosuria and greater than 80 ketonuria.  He has trace hemoglobin in his urine and he has moderate bilirubin in his urine.  He has some proteinuria and urobilinogen; however, there are no signs of infection.  He had a lactate of 1.0.  His LFTs show a total bilirubin of 2.0 with an indirect fraction of 1.6.  Rest of his LFTs are unremarkable.  His lipase is normal.  His  chemistry panel shows a sodium of 133 with a potassium of 4.3.  His carbon dioxide is 14, glucose was 456.  BUN and creatinine are 13 and 1.06.  Calcium is 10.4.  His CBC is significant for white count of 19.3 with 80% neutrophils, otherwise fairly unremarkable, hematocrit is at 39.3, and platelet count of 248.  RADIOLOGY:  Shows acute diverticulitis in the proximal sigmoid with perforation on the mesenteric side of the colon and a small amount of free air within the mesentery.  He has a complex multicystic mass within both kidneys, the larger  one on the left measuring 3.6 x 3.0 cm, increased significantly from 2011.  They recommend MRI to exclude malignancy.  He has a stable 8-mm pleural-based nodule in the right lung base.  IMPRESSION:  This is a 50 year old male with a history of diabetes who presents with an acute perforated diverticulitis in his proximal sigmoid colon that is going to be managed surgically.  Internal Medicine is consulted to help with his DKA. 1. Diabetic ketoacidosis.  He has a pure anion gap acidosis with a     normal delta, hyperglycemia, ketonuria, and glucosuria.     Interestingly, he described himself as a type 2 diabetic, however,     his constellation of lab values would argue against this.  While     this is not quite as relevant to his more pressing problems at this     time, it was imparted to him and his fiancee that he may be insulin     dependent if he is now in DKA.  Regardless, we agree with the current management of IV fluids and he is now 2 L positive with normal saline with the plan to switch to D5 half- normal when his blood glucose goes below 250.  I would run D5 half normal at 150 per hour.  When we have evidence that he is out of ketoacidosis (normalized bicarb and UA showing no evidence of persistent ketonuria), I would start him up on subcutaneous insulin (see sliding scale insulin form) for least 1 hour overlapped with IV insulin and  the D5 half-normal running to ensure that the subcutaneous insulin becomes therapeutic.  After that, he can continue on subcutaneous insulin per the regimen.  1. Perforated diverticulitis.  As per Surgery, we do agree with the     plans for Zosyn.  We would also get a blood culture.  1. Indirect hyperbilirubinemia.  This would suggest hemolysis for     unclear reasons.  His hematocrit is at 39.3, but I do not know what     his baseline is, therefore I have asked for an LDH and haptoglobin     and repeat T. bili with fractionation with a.m. labs to assess     whether he is hemolyzing.  The patient will be followed by [facility].  Thank you for this consultation.  We appreciate the opportunity to be involved in the care of Mr. Martus.          ______________________________ Ria Bush, MD     EB/MEDQ  D:  07/26/2011  T:  07/26/2011  Job:  [id number]  Electronically Signed by Ria Bush MD on 09/07/2011 12:05:42 PM

## 2011-09-12 ENCOUNTER — Other Ambulatory Visit: Payer: Self-pay | Admitting: Occupational Medicine

## 2011-09-12 ENCOUNTER — Ambulatory Visit: Payer: Self-pay

## 2011-09-12 DIAGNOSIS — Z021 Encounter for pre-employment examination: Secondary | ICD-10-CM

## 2011-09-28 ENCOUNTER — Other Ambulatory Visit: Payer: Self-pay | Admitting: Interventional Radiology

## 2011-09-28 ENCOUNTER — Other Ambulatory Visit (HOSPITAL_COMMUNITY): Payer: Self-pay

## 2011-09-28 ENCOUNTER — Encounter (HOSPITAL_COMMUNITY): Payer: Self-pay

## 2011-09-28 LAB — CBC
HCT: 42.5 % (ref 39.0–52.0)
Hemoglobin: 14.7 g/dL (ref 13.0–17.0)
MCH: 30.6 pg (ref 26.0–34.0)
MCHC: 34.6 g/dL (ref 30.0–36.0)
MCV: 88.5 fL (ref 78.0–100.0)
RDW: 12 % (ref 11.5–15.5)

## 2011-09-28 LAB — BASIC METABOLIC PANEL
BUN: 13 mg/dL (ref 6–23)
Calcium: 10.2 mg/dL (ref 8.4–10.5)
Creatinine, Ser: 0.79 mg/dL (ref 0.50–1.35)
GFR calc non Af Amer: >90 mL/min (ref >90)
Glucose, Bld: 91 mg/dL (ref 70–99)
Potassium: 4.3 mEq/L (ref 3.5–5.1)

## 2011-09-28 LAB — APTT: aPTT: 36 seconds (ref 24–37)

## 2011-09-30 ENCOUNTER — Other Ambulatory Visit: Payer: Self-pay | Admitting: Interventional Radiology

## 2011-09-30 ENCOUNTER — Ambulatory Visit (HOSPITAL_COMMUNITY): Payer: Self-pay

## 2011-09-30 ENCOUNTER — Observation Stay (HOSPITAL_COMMUNITY)
Admission: RE | Admit: 2011-09-30 | Discharge: 2011-10-01 | Disposition: A | Discharge status: Home / Self Care | Payer: Self-pay | Source: Ambulatory Visit | Attending: Interventional Radiology | Admitting: Interventional Radiology

## 2011-09-30 ENCOUNTER — Ambulatory Visit (HOSPITAL_COMMUNITY)
Admission: RE | Admit: 2011-09-30 | Discharge: 2011-09-30 | Disposition: A | Discharge status: Home / Self Care | Payer: Self-pay | Source: Ambulatory Visit | Attending: Interventional Radiology | Admitting: Interventional Radiology

## 2011-09-30 DIAGNOSIS — Z79899 Other long term (current) drug therapy: Secondary | ICD-10-CM | POA: Insufficient documentation

## 2011-09-30 DIAGNOSIS — E119 Type 2 diabetes mellitus without complications: Secondary | ICD-10-CM | POA: Insufficient documentation

## 2011-09-30 DIAGNOSIS — C649 Malignant neoplasm of unspecified kidney, except renal pelvis: Principal | ICD-10-CM | POA: Insufficient documentation

## 2011-09-30 DIAGNOSIS — Z794 Long term (current) use of insulin: Secondary | ICD-10-CM | POA: Insufficient documentation

## 2011-09-30 DIAGNOSIS — Z01812 Encounter for preprocedural laboratory examination: Secondary | ICD-10-CM | POA: Insufficient documentation

## 2011-09-30 DIAGNOSIS — I10 Essential (primary) hypertension: Secondary | ICD-10-CM | POA: Insufficient documentation

## 2011-09-30 DIAGNOSIS — N2889 Other specified disorders of kidney and ureter: Secondary | ICD-10-CM

## 2011-09-30 LAB — GLUCOSE, CAPILLARY
Glucose-Capillary: 139 mg/dL — ABNORMAL HIGH (ref 70–99)
Glucose-Capillary: 138 mg/dL — ABNORMAL HIGH (ref 70–99)

## 2011-10-01 LAB — CBC
MCH: 29.4 pg (ref 26.0–34.0)
MCHC: 33.2 g/dL (ref 30.0–36.0)
MCV: 88.7 fL (ref 78.0–100.0)
Platelets: 200 10*3/uL (ref 150–400)
RBC: 4.32 MIL/uL (ref 4.22–5.81)

## 2011-10-01 LAB — BASIC METABOLIC PANEL
CO2: 27 mEq/L (ref 19–32)
Calcium: 9 mg/dL (ref 8.4–10.5)
Creatinine, Ser: 0.87 mg/dL (ref 0.50–1.35)
Glucose, Bld: 143 mg/dL — ABNORMAL HIGH (ref 70–99)

## 2011-10-01 LAB — GLUCOSE, CAPILLARY: Glucose-Capillary: 107 mg/dL — ABNORMAL HIGH (ref 70–99)

## 2011-10-02 LAB — CROSSMATCH
ABO/RH(D): B POS
Antibody Screen: NEGATIVE
Unit division: 0

## 2011-10-03 ENCOUNTER — Other Ambulatory Visit (HOSPITAL_COMMUNITY): Payer: Self-pay | Admitting: Interventional Radiology

## 2011-10-03 ENCOUNTER — Other Ambulatory Visit: Payer: Self-pay | Admitting: Interventional Radiology

## 2011-10-03 DIAGNOSIS — N2889 Other specified disorders of kidney and ureter: Secondary | ICD-10-CM

## 2011-11-03 ENCOUNTER — Telehealth: Payer: Self-pay | Admitting: Emergency Medicine

## 2011-11-03 NOTE — Telephone Encounter (Signed)
LMOVM TO REMIND PT TO HAVE LABS DRAWN TODAY OR TOMORROW AT [facility].

## 2011-11-07 ENCOUNTER — Other Ambulatory Visit: Payer: Self-pay | Admitting: Interventional Radiology

## 2011-11-07 LAB — BUN: BUN: 17 mg/dL (ref 6–23)

## 2011-11-08 ENCOUNTER — Ambulatory Visit
Admission: RE | Admit: 2011-11-08 | Discharge: 2011-11-08 | Disposition: A | Discharge status: Home / Self Care | Payer: No Typology Code available for payment source | Source: Ambulatory Visit | Attending: Interventional Radiology | Admitting: Interventional Radiology

## 2011-11-08 ENCOUNTER — Ambulatory Visit (HOSPITAL_COMMUNITY)
Admission: RE | Admit: 2011-11-08 | Discharge: 2011-11-08 | Disposition: A | Discharge status: Home / Self Care | Payer: Self-pay | Source: Ambulatory Visit | Attending: Interventional Radiology | Admitting: Interventional Radiology

## 2011-11-08 DIAGNOSIS — K5730 Diverticulosis of large intestine without perforation or abscess without bleeding: Secondary | ICD-10-CM | POA: Insufficient documentation

## 2011-11-08 DIAGNOSIS — M533 Sacrococcygeal disorders, not elsewhere classified: Secondary | ICD-10-CM | POA: Insufficient documentation

## 2011-11-08 DIAGNOSIS — R109 Unspecified abdominal pain: Secondary | ICD-10-CM | POA: Insufficient documentation

## 2011-11-08 DIAGNOSIS — N2889 Other specified disorders of kidney and ureter: Secondary | ICD-10-CM

## 2011-11-08 DIAGNOSIS — N289 Disorder of kidney and ureter, unspecified: Secondary | ICD-10-CM | POA: Insufficient documentation

## 2011-11-08 MED ORDER — IOHEXOL 300 MG/ML  SOLN
100.0000 mL | Freq: Once | INTRAMUSCULAR | Status: AC | PRN
  Administered 2011-11-08: 100 mL via INTRAVENOUS

## 2011-11-08 NOTE — Progress Notes (Signed)
Pt is 38mo post rt renal ablation. Denies fever, chills,sweats, appetite about the same.  No problems w/ urination or BM.  Some pain from diverticulitis.

## 2011-11-11 ENCOUNTER — Encounter (HOSPITAL_COMMUNITY): Payer: Self-pay | Admitting: Physical Medicine and Rehabilitation

## 2011-11-11 ENCOUNTER — Emergency Department (HOSPITAL_COMMUNITY)
Admission: EM | Admit: 2011-11-11 | Discharge: 2011-11-11 | Disposition: A | Discharge status: Home / Self Care | Payer: Self-pay | Attending: Emergency Medicine | Admitting: Emergency Medicine

## 2011-11-11 DIAGNOSIS — N289 Disorder of kidney and ureter, unspecified: Secondary | ICD-10-CM | POA: Insufficient documentation

## 2011-11-11 DIAGNOSIS — R109 Unspecified abdominal pain: Secondary | ICD-10-CM | POA: Insufficient documentation

## 2011-11-11 DIAGNOSIS — K5792 Diverticulitis of intestine, part unspecified, without perforation or abscess without bleeding: Secondary | ICD-10-CM

## 2011-11-11 DIAGNOSIS — E119 Type 2 diabetes mellitus without complications: Secondary | ICD-10-CM | POA: Insufficient documentation

## 2011-11-11 DIAGNOSIS — Z794 Long term (current) use of insulin: Secondary | ICD-10-CM | POA: Insufficient documentation

## 2011-11-11 DIAGNOSIS — K5732 Diverticulitis of large intestine without perforation or abscess without bleeding: Secondary | ICD-10-CM | POA: Insufficient documentation

## 2011-11-11 DIAGNOSIS — I10 Essential (primary) hypertension: Secondary | ICD-10-CM | POA: Insufficient documentation

## 2011-11-11 LAB — URINALYSIS, ROUTINE W REFLEX MICROSCOPIC
Bilirubin Urine: NEGATIVE
Hgb urine dipstick: NEGATIVE
Nitrite: NEGATIVE
Specific Gravity, Urine: 1.03 (ref 1.005–1.030)
pH: 6.5 (ref 5.0–8.0)

## 2011-11-11 LAB — DIFFERENTIAL
Eosinophils Absolute: 0.1 10*3/uL (ref 0.0–0.7)
Lymphocytes Relative: 62 % — ABNORMAL HIGH (ref 12–46)
Lymphs Abs: 3.4 10*3/uL (ref 0.7–4.0)
Monocytes Relative: 8 % (ref 3–12)
Neutro Abs: 1.5 10*3/uL — ABNORMAL LOW (ref 1.7–7.7)
Neutrophils Relative %: 27 % — ABNORMAL LOW (ref 43–77)

## 2011-11-11 LAB — BASIC METABOLIC PANEL
BUN: 19 mg/dL (ref 6–23)
CO2: 28 mEq/L (ref 19–32)
Chloride: 101 mEq/L (ref 96–112)
Glucose, Bld: 64 mg/dL — ABNORMAL LOW (ref 70–99)
Potassium: 3.4 mEq/L — ABNORMAL LOW (ref 3.5–5.1)
Sodium: 138 mEq/L (ref 135–145)

## 2011-11-11 LAB — CBC
Hemoglobin: 14 g/dL (ref 13.0–17.0)
MCH: 30.2 pg (ref 26.0–34.0)
RBC: 4.63 MIL/uL (ref 4.22–5.81)
WBC: 5.5 10*3/uL (ref 4.0–10.5)

## 2011-11-11 MED ORDER — OXYCODONE-ACETAMINOPHEN 5-325 MG PO TABS
1.0000 | ORAL_TABLET | Freq: Once | ORAL | Status: AC
  Administered 2011-11-11: 1 via ORAL
  Filled 2011-11-11: qty 1

## 2011-11-11 MED ORDER — CIPROFLOXACIN HCL 750 MG PO TABS: 750.0000 mg | ORAL_TABLET | Freq: Two times a day (BID) | ORAL | Status: AC

## 2011-11-11 MED ORDER — METRONIDAZOLE 500 MG PO TABS: 500.0000 mg | ORAL_TABLET | Freq: Four times a day (QID) | ORAL | Status: DC

## 2011-11-11 MED ORDER — OXYCODONE-ACETAMINOPHEN 5-325 MG PO TABS: 1.0000 | ORAL_TABLET | ORAL | Status: AC | PRN

## 2011-11-11 NOTE — ED Notes (Signed)
Pt presents to department for evaluation of L sided abdominal pain. Ongoing x3 days. Pt states "burning" sensation to abdomen. No nausea/vomiting. No fever. No diarrhea. 10/10 pain upon arrival. No urinary problems. Pt is conscious alert and oriented x4.

## 2011-11-11 NOTE — ED Provider Notes (Signed)
History     CSN: [id number] Arrival date & time: 11/11/2011  3:24 PM   First MD Initiated Contact with Patient 11/11/11 1800      Chief Complaint  Patient presents with  . Abdominal Pain    (Consider location/radiation/quality/duration/timing/severity/associated sxs/prior treatment) Patient is a 50 y.o. male presenting with abdominal pain. The history is provided by the patient.  Abdominal Pain The primary symptoms of the illness include abdominal pain. The primary symptoms of the illness do not include shortness of breath, nausea, vomiting or diarrhea. The current episode started more than 2 days ago.  Symptoms associated with the illness do not include back pain.   left-sided abdominal pain for last 3 days. Some burning. No nausea vomiting no diarrhea. He had a recent CAT scan for ablation of right renal mass. He also is a left renal mass will be ablated later. History of diverticulitis he states this feels like that. He states that Dr. Kathlene Cote stated is diverticulosis with some inflammation. He was not started on any antibiotics. He states his primary care doctor cannot get him in for a while. No fevers. He states he is hungry. No vomiting.  No dysuria.  Past Medical History  Diagnosis Date  . Hypertension   . Diabetes mellitus   . Bilateral renal masses     History reviewed. No pertinent past surgical history.  History reviewed. No pertinent family history.  History  Substance Use Topics  . Smoking status: Never Smoker   . Smokeless tobacco: Never Used  . Alcohol Use: Yes      Review of Systems  Constitutional: Negative for activity change and appetite change.  HENT: Negative for neck stiffness.   Eyes: Negative for pain.  Respiratory: Negative for chest tightness and shortness of breath.   Cardiovascular: Negative for chest pain and leg swelling.  Gastrointestinal: Positive for abdominal pain. Negative for nausea, vomiting and diarrhea.  Genitourinary: Negative  for flank pain.  Musculoskeletal: Negative for back pain.  Skin: Negative for rash.  Neurological: Negative for weakness, numbness and headaches.  Psychiatric/Behavioral: Negative for behavioral problems.    Allergies  Review of patient's allergies indicates no known allergies.  Home Medications   Current Outpatient Rx  Name Route Sig Dispense Refill  . INSULIN ISOPHANE & REGULAR (70-30) 100 UNIT/ML [place] SUSP Subcutaneous Inject 40 Units into the skin 2 (two) times daily with a meal.      . TELMISARTAN 40 MG PO TABS Oral Take 40 mg by mouth daily.      [name] CIPROFLOXACIN HCL 750 MG PO TABS Oral Take 1 tablet (750 mg total) by mouth 2 (two) times daily. 28 tablet 0  . METRONIDAZOLE 500 MG PO TABS Oral Take 1 tablet (500 mg total) by mouth 4 (four) times daily. 56 tablet 0  . OXYCODONE-ACETAMINOPHEN 5-325 MG PO TABS Oral Take 1 tablet by mouth every 4 (four) hours as needed for pain. 15 tablet 0    BP 152/92  Pulse 64  Temp(Src) 98.2 F (36.8 C) (Oral)  Resp 18  SpO2 98%  Physical Exam  Nursing note and vitals reviewed. Constitutional: He is oriented to person, place, and time. He appears well-developed and well-nourished.  HENT:  Head: Normocephalic and atraumatic.  Eyes: EOM are normal. Pupils are equal, round, and reactive to light.  Neck: Normal range of motion. Neck supple.  Cardiovascular: Normal rate, regular rhythm and normal heart sounds.   No murmur heard. Pulmonary/Chest: Effort normal and breath sounds normal.  Abdominal:  Soft. Bowel sounds are normal. He exhibits no distension and no mass. There is tenderness. There is no rebound and no guarding.       Mild left lower abdominal pain without rebound or guarding.  Musculoskeletal: Normal range of motion. He exhibits no edema.  Neurological: He is alert and oriented to person, place, and time. No cranial nerve deficit.  Skin: Skin is warm and dry.  Psychiatric: He has a normal mood and affect.    ED Course    Procedures (including critical care time)  Labs Reviewed  URINALYSIS, ROUTINE W REFLEX MICROSCOPIC - Abnormal; Notable for the following:    Ketones, ur 15 (*)    All other components within normal limits  DIFFERENTIAL - Abnormal; Notable for the following:    Neutrophils Relative 27 (*)    Neutro Abs 1.5 (*)    Lymphocytes Relative 62 (*)    All other components within normal limits  CBC  BASIC METABOLIC PANEL   No results found.   1. Diverticulitis       MDM  Abdominal pain with a history of diverticulitis. Recently had ablation of mass in right kidney. CT at that time did not show the whole colon do not include the pelvis. He states it feels like his previous diverticulitis. He still has a basic metabolic pending. He will likely be discharged home. He will be signed out.        Jasper Riling. Alvino Chapel, MD 11/11/11 [phone]

## 2011-11-11 NOTE — ED Provider Notes (Signed)
Brandon [name] S 8:00 PM patient discussed in sign out with Dr. Alvino Chapel, pt awaiting BMP result. If normal patient plans to be discharged with treatment for diverticulitis. Discharge papers instructions including prescriptions have been presented by Dr. Alvino Chapel.  8:30 PM BMP results are back in unremarkable for any significant findings. pt has normal renal function. Plan discharge at this time.   Results for orders placed during the hospital encounter of 11/11/11  URINALYSIS, ROUTINE W REFLEX MICROSCOPIC      Component Value Range   Color, Urine YELLOW  YELLOW    APPearance CLEAR  CLEAR    Specific Gravity, Urine 1.030  1.005 - 1.030    pH 6.5  5.0 - 8.0    Glucose, UA NEGATIVE  NEGATIVE (mg/dL)   Hgb urine dipstick NEGATIVE  NEGATIVE    Bilirubin Urine NEGATIVE  NEGATIVE    Ketones, ur 15 (*) NEGATIVE (mg/dL)   Protein, ur NEGATIVE  NEGATIVE (mg/dL)   Urobilinogen, UA 1.0  0.0 - 1.0 (mg/dL)   Nitrite NEGATIVE  NEGATIVE    Leukocytes, UA NEGATIVE  NEGATIVE   CBC      Component Value Range   WBC 5.5  4.0 - 10.5 (K/uL)   RBC 4.63  4.22 - 5.81 (MIL/uL)   Hemoglobin 14.0  13.0 - 17.0 (g/dL)   HCT 39.1  39.0 - 52.0 (%)   MCV 84.4  78.0 - 100.0 (fL)   MCH 30.2  26.0 - 34.0 (pg)   MCHC 35.8  30.0 - 36.0 (g/dL)   RDW 11.5  11.5 - 15.5 (%)   Platelets 216  150 - 400 (K/uL)  DIFFERENTIAL      Component Value Range   Neutrophils Relative 27 (*) 43 - 77 (%)   Neutro Abs 1.5 (*) 1.7 - 7.7 (K/uL)   Lymphocytes Relative 62 (*) 12 - 46 (%)   Lymphs Abs 3.4  0.7 - 4.0 (K/uL)   Monocytes Relative 8  3 - 12 (%)   Monocytes Absolute 0.5  0.1 - 1.0 (K/uL)   Eosinophils Relative 2  0 - 5 (%)   Eosinophils Absolute 0.1  0.0 - 0.7 (K/uL)   Basophils Relative 0  0 - 1 (%)   Basophils Absolute 0.0  0.0 - 0.1 (K/uL)  BASIC METABOLIC PANEL      Component Value Range   Sodium 138  135 - 145 (mEq/L)   Potassium 3.4 (*) 3.5 - 5.1 (mEq/L)   Chloride 101  96 - 112 (mEq/L)   CO2 28  19 - 32 (mEq/L)     Glucose, Bld 64 (*) 70 - 99 (mg/dL)   BUN 19  6 - 23 (mg/dL)   Creatinine, Ser 0.96  0.50 - 1.35 (mg/dL)   Calcium 9.2  8.4 - 10.5 (mg/dL)   GFR calc non Af Amer >90  >90 (mL/min)   GFR calc Af Amer >90  >90 (mL/min)     Brandon Lee, PA 11/11/11 2032

## 2011-11-14 NOTE — ED Provider Notes (Signed)
Medical screening examination/treatment/procedure(s) were conducted as a shared visit with non-physician practitioner(s) and myself.  I personally evaluated the patient during the encounter  Brandon [name]. Alvino Chapel, MD 11/14/11 [phone]

## 2011-11-15 ENCOUNTER — Other Ambulatory Visit (HOSPITAL_COMMUNITY): Payer: Self-pay | Admitting: Interventional Radiology

## 2011-11-15 DIAGNOSIS — N2889 Other specified disorders of kidney and ureter: Secondary | ICD-10-CM

## 2011-11-22 ENCOUNTER — Encounter (HOSPITAL_COMMUNITY): Payer: Self-pay | Admitting: Pharmacy Technician

## 2011-11-24 ENCOUNTER — Emergency Department (HOSPITAL_COMMUNITY)
Admission: EM | Admit: 2011-11-24 | Discharge: 2011-11-25 | Disposition: A | Discharge status: Home / Self Care | Payer: Self-pay | Attending: Emergency Medicine | Admitting: Emergency Medicine

## 2011-11-24 ENCOUNTER — Encounter (HOSPITAL_COMMUNITY): Payer: Self-pay | Admitting: Emergency Medicine

## 2011-11-24 DIAGNOSIS — J984 Other disorders of lung: Secondary | ICD-10-CM | POA: Insufficient documentation

## 2011-11-24 DIAGNOSIS — E119 Type 2 diabetes mellitus without complications: Secondary | ICD-10-CM | POA: Insufficient documentation

## 2011-11-24 DIAGNOSIS — G8929 Other chronic pain: Secondary | ICD-10-CM

## 2011-11-24 DIAGNOSIS — R110 Nausea: Secondary | ICD-10-CM | POA: Insufficient documentation

## 2011-11-24 DIAGNOSIS — Z794 Long term (current) use of insulin: Secondary | ICD-10-CM | POA: Insufficient documentation

## 2011-11-24 DIAGNOSIS — R911 Solitary pulmonary nodule: Secondary | ICD-10-CM

## 2011-11-24 DIAGNOSIS — I10 Essential (primary) hypertension: Secondary | ICD-10-CM | POA: Insufficient documentation

## 2011-11-24 DIAGNOSIS — Z9889 Other specified postprocedural states: Secondary | ICD-10-CM | POA: Insufficient documentation

## 2011-11-24 DIAGNOSIS — M549 Dorsalgia, unspecified: Secondary | ICD-10-CM | POA: Insufficient documentation

## 2011-11-24 DIAGNOSIS — R10812 Left upper quadrant abdominal tenderness: Secondary | ICD-10-CM | POA: Insufficient documentation

## 2011-11-24 DIAGNOSIS — R0602 Shortness of breath: Secondary | ICD-10-CM | POA: Insufficient documentation

## 2011-11-24 DIAGNOSIS — R1012 Left upper quadrant pain: Secondary | ICD-10-CM | POA: Insufficient documentation

## 2011-11-24 HISTORY — DX: Diverticulitis of intestine, part unspecified, without perforation or abscess without bleeding: K57.92

## 2011-11-24 HISTORY — DX: Neoplasm of unspecified behavior of unspecified kidney: D49.519

## 2011-11-24 LAB — CBC
HCT: 40.8 % (ref 39.0–52.0)
MCHC: 36.8 g/dL — ABNORMAL HIGH (ref 30.0–36.0)
MCV: 83.8 fL (ref 78.0–100.0)
Platelets: 247 10*3/uL (ref 150–400)
RDW: 11.6 % (ref 11.5–15.5)
WBC: 6 10*3/uL (ref 4.0–10.5)

## 2011-11-24 LAB — DIFFERENTIAL
Basophils Absolute: 0 10*3/uL (ref 0.0–0.1)
Basophils Relative: 1 % (ref 0–1)
Eosinophils Relative: 2 % (ref 0–5)
Lymphocytes Relative: 45 % (ref 12–46)
Monocytes Absolute: 0.5 10*3/uL (ref 0.1–1.0)
Neutro Abs: 2.7 10*3/uL (ref 1.7–7.7)

## 2011-11-24 NOTE — ED Notes (Signed)
Triage documentation entered by rn not nt.

## 2011-11-24 NOTE — ED Notes (Signed)
PT. LUQ PAIN AND LEFT BACK ONSET 2 WEEKS AGO , DENIES VOMITTING OR DIARRHEA ,  STATES HISTORY OF DIVERTICULITIS ,  DENIES FEVER OR CHILLS.

## 2011-11-25 ENCOUNTER — Emergency Department (HOSPITAL_COMMUNITY): Payer: Self-pay

## 2011-11-25 LAB — HEPATIC FUNCTION PANEL
ALT: 18 U/L (ref 0–53)
Alkaline Phosphatase: 86 U/L (ref 39–117)
Bilirubin, Direct: 0.1 mg/dL (ref 0.0–0.3)
Indirect Bilirubin: 0.4 mg/dL (ref 0.3–0.9)
Total Bilirubin: 0.5 mg/dL (ref 0.3–1.2)

## 2011-11-25 LAB — COMPREHENSIVE METABOLIC PANEL
ALT: 18 U/L (ref 0–53)
Alkaline Phosphatase: 88 U/L (ref 39–117)
BUN: 13 mg/dL (ref 6–23)
Chloride: 99 mEq/L (ref 96–112)
GFR calc Af Amer: >90 mL/min (ref >90)
Glucose, Bld: 302 mg/dL — ABNORMAL HIGH (ref 70–99)
Potassium: 3.3 mEq/L — ABNORMAL LOW (ref 3.5–5.1)
Sodium: 134 mEq/L — ABNORMAL LOW (ref 135–145)
Total Bilirubin: 0.5 mg/dL (ref 0.3–1.2)
Total Protein: 7.7 g/dL (ref 6.0–8.3)

## 2011-11-25 LAB — URINALYSIS, ROUTINE W REFLEX MICROSCOPIC
Bilirubin Urine: NEGATIVE
Glucose, UA: >1000 mg/dL — AB
Hgb urine dipstick: NEGATIVE
Specific Gravity, Urine: 1.031 — ABNORMAL HIGH (ref 1.005–1.030)
pH: 5.5 (ref 5.0–8.0)

## 2011-11-25 LAB — URINE MICROSCOPIC-ADD ON

## 2011-11-25 LAB — LIPASE, BLOOD: Lipase: 40 U/L (ref 11–59)

## 2011-11-25 MED ORDER — SODIUM CHLORIDE 0.9 % IV BOLUS (SEPSIS)
1000.0000 mL | Freq: Once | INTRAVENOUS | Status: AC
  Administered 2011-11-25: 1000 mL via INTRAVENOUS

## 2011-11-25 MED ORDER — HYDROMORPHONE HCL PF 1 MG/ML IJ SOLN
1.0000 mg | Freq: Once | INTRAMUSCULAR | Status: AC
  Administered 2011-11-25: 1 mg via INTRAVENOUS
  Filled 2011-11-25: qty 1

## 2011-11-25 MED ORDER — OXYCODONE-ACETAMINOPHEN 5-325 MG PO TABS: 1.0000 | ORAL_TABLET | Freq: Four times a day (QID) | ORAL | Status: DC | PRN

## 2011-11-25 MED ORDER — IOHEXOL 350 MG/ML SOLN: 100.0000 mL | Freq: Once | INTRAVENOUS | Status: DC | PRN

## 2011-11-25 MED ORDER — ONDANSETRON HCL 4 MG/2ML IJ SOLN
4.0000 mg | Freq: Once | INTRAMUSCULAR | Status: AC
  Administered 2011-11-25: 4 mg via INTRAVENOUS
  Filled 2011-11-25: qty 2

## 2011-11-25 NOTE — ED Notes (Signed)
The pt returned from c-t.  The iv in his rt forearm had infiltrated and the c-t tech had started another iv in his lt a-c.  He is still hurting 7/10

## 2011-11-25 NOTE — ED Provider Notes (Signed)
History     CSN: [id number]  Arrival date & time 11/24/11  2242   First MD Initiated Contact with Patient 11/25/11 0112      Chief Complaint  Patient presents with  . Abdominal Pain     HPI  Brandon [name] is a 50 y.o. male who presents to the Emergency Department complaining of persistent LUQ abd and side pains for the past 2wks. Hx provided by pt.  Pt has hx of bilateral renal masses with hx of rt kidney ablation and surgery.  Pt reports that he has been seen in ED 1-2 wks ago for similar complaints of left sided pains that are not improved with OTC pain meds at home.  Pt states he had a CT scan performed with no significant changes.  Pt denies any new symptoms.  Pain has remained the same.  Pain is sharp at times and radiates around to back.  Pt denies EtOH use.  Pt reports normal appetite.  Pt also has hx of HTN and DM.     Past Medical History  Diagnosis Date  . Hypertension   . Diabetes mellitus   . Bilateral renal masses   . Diverticulitis   . Kidney tumor     Past Surgical History  Procedure Date  . Kidney surgery     No family history on file.  History  Substance Use Topics  . Smoking status: Never Smoker   . Smokeless tobacco: Never Used  . Alcohol Use: Yes      Review of Systems  Constitutional: Negative for fever and chills.  Respiratory: Positive for shortness of breath.   Cardiovascular: Positive for chest pain.  Gastrointestinal: Positive for nausea and abdominal pain. Negative for vomiting, diarrhea and constipation.  All other systems reviewed and are negative.    Allergies  Review of patient's allergies indicates no known allergies.  Home Medications   Current Outpatient Rx  Name Route Sig Dispense Refill  . INSULIN ISOPHANE & REGULAR (70-30) 100 UNIT/ML [place] SUSP Subcutaneous Inject 40 Units into the skin 2 (two) times daily with a meal.      . TELMISARTAN 40 MG PO TABS Oral Take 40 mg by mouth every morning.       BP 158/98  Pulse  83  Temp(Src) 98.2 F (36.8 C) (Oral)  Resp 20  SpO2 98%  Physical Exam  Nursing note and vitals reviewed. Constitutional: He is oriented to person, place, and time. He appears well-developed and well-nourished. No distress.  HENT:  Head: Normocephalic.  Mouth/Throat: Oropharynx is clear and moist.  Cardiovascular: Normal rate and regular rhythm.   Pulmonary/Chest: Effort normal and breath sounds normal.  Abdominal: Soft. Normal appearance. There is tenderness in the left upper quadrant. There is CVA tenderness. There is no rebound, no guarding, no tenderness at McBurney's point and negative Murphy's sign.  Musculoskeletal: He exhibits no edema and no tenderness.  Neurological: He is alert and oriented to person, place, and time.  Skin: Skin is warm. No rash noted.  Psychiatric: He has a normal mood and affect. His behavior is normal.    ED Course  Procedures (including critical care time)  Labs Reviewed  URINALYSIS, ROUTINE W REFLEX MICROSCOPIC - Abnormal; Notable for the following:    Specific Gravity, Urine 1.031 (*)    Glucose, UA >1000 (*)    All other components within normal limits  COMPREHENSIVE METABOLIC PANEL - Abnormal; Notable for the following:    Sodium 134 (*)    Potassium  3.3 (*)    Glucose, Bld 302 (*)    All other components within normal limits  CBC - Abnormal; Notable for the following:    MCHC 36.8 (*)    All other components within normal limits  DIFFERENTIAL  URINE MICROSCOPIC-ADD ON  HEPATIC FUNCTION PANEL  LIPASE, BLOOD   Results for orders placed during the hospital encounter of 11/24/11  URINALYSIS, ROUTINE W REFLEX MICROSCOPIC      Component Value Range   Color, Urine YELLOW  YELLOW    APPearance CLEAR  CLEAR    Specific Gravity, Urine 1.031 (*) 1.005 - 1.030    pH 5.5  5.0 - 8.0    Glucose, UA >1000 (*) NEGATIVE (mg/dL)   Hgb urine dipstick NEGATIVE  NEGATIVE    Bilirubin Urine NEGATIVE  NEGATIVE    Ketones, ur NEGATIVE  NEGATIVE  (mg/dL)   Protein, ur NEGATIVE  NEGATIVE (mg/dL)   Urobilinogen, UA 1.0  0.0 - 1.0 (mg/dL)   Nitrite NEGATIVE  NEGATIVE    Leukocytes, UA NEGATIVE  NEGATIVE   COMPREHENSIVE METABOLIC PANEL      Component Value Range   Sodium 134 (*) 135 - 145 (mEq/L)   Potassium 3.3 (*) 3.5 - 5.1 (mEq/L)   Chloride 99  96 - 112 (mEq/L)   CO2 24  19 - 32 (mEq/L)   Glucose, Bld 302 (*) 70 - 99 (mg/dL)   BUN 13  6 - 23 (mg/dL)   Creatinine, Ser 0.80  0.50 - 1.35 (mg/dL)   Calcium 9.4  8.4 - 10.5 (mg/dL)   Total Protein 7.7  6.0 - 8.3 (g/dL)   Albumin 3.7  3.5 - 5.2 (g/dL)   AST 17  0 - 37 (U/L)   ALT 18  0 - 53 (U/L)   Alkaline Phosphatase 88  39 - 117 (U/L)   Total Bilirubin 0.5  0.3 - 1.2 (mg/dL)   GFR calc non Af Amer >90  >90 (mL/min)   GFR calc Af Amer >90  >90 (mL/min)  CBC      Component Value Range   WBC 6.0  4.0 - 10.5 (K/uL)   RBC 4.87  4.22 - 5.81 (MIL/uL)   Hemoglobin 15.0  13.0 - 17.0 (g/dL)   HCT 40.8  39.0 - 52.0 (%)   MCV 83.8  78.0 - 100.0 (fL)   MCH 30.8  26.0 - 34.0 (pg)   MCHC 36.8 (*) 30.0 - 36.0 (g/dL)   RDW 11.6  11.5 - 15.5 (%)   Platelets 247  150 - 400 (K/uL)  DIFFERENTIAL      Component Value Range   Neutrophils Relative 44  43 - 77 (%)   Neutro Abs 2.7  1.7 - 7.7 (K/uL)   Lymphocytes Relative 45  12 - 46 (%)   Lymphs Abs 2.7  0.7 - 4.0 (K/uL)   Monocytes Relative 8  3 - 12 (%)   Monocytes Absolute 0.5  0.1 - 1.0 (K/uL)   Eosinophils Relative 2  0 - 5 (%)   Eosinophils Absolute 0.1  0.0 - 0.7 (K/uL)   Basophils Relative 1  0 - 1 (%)   Basophils Absolute 0.0  0.0 - 0.1 (K/uL)  URINE MICROSCOPIC-ADD ON      Component Value Range   Squamous Epithelial / LPF RARE  RARE    WBC, UA 0-2  <3 (WBC/hpf)   RBC / HPF 0-2  <3 (RBC/hpf)  HEPATIC FUNCTION PANEL      Component Value Range  Total Protein 7.8  6.0 - 8.3 (g/dL)   Albumin 3.9  3.5 - 5.2 (g/dL)   AST 19  0 - 37 (U/L)   ALT 18  0 - 53 (U/L)   Alkaline Phosphatase 86  39 - 117 (U/L)   Total Bilirubin 0.5   0.3 - 1.2 (mg/dL)   Bilirubin, Direct 0.1  0.0 - 0.3 (mg/dL)   Indirect Bilirubin 0.4  0.3 - 0.9 (mg/dL)  LIPASE, BLOOD      Component Value Range   Lipase 40  11 - 59 (U/L)     Ct Angio Chest W/cm &/or Wo Cm  11/25/2011  *RADIOLOGY REPORT*  Clinical Data:  Left-sided chest pain  CT ANGIOGRAPHY CHEST WITH CONTRAST  Technique:  Multidetector CT imaging of the chest was performed using the standard protocol during bolus administration of intravenous contrast.  Multiplanar CT image reconstructions including MIPs were obtained to evaluate the vascular anatomy.  Contrast:  100 ml Omnipaque 315  Comparison:  08/04/2011  Findings:  No pulmonary arterial branch filling defect.  Normal heart size. Coronary artery calcification.  Aorta is of normal course and caliber.  No pleural or pericardial effusion.  No intrathoracic lymphadenopathy.  Minimal stranding of the anterior mediastinal fat is nonspecific.  Central airways are patent.  6 mm right upper lobe nodule (series 6 image 85) measured 2 mm previously right middle lobe subpleural nodule measures 7 mm, 5 mm previously.  There are several additional smaller nodules and appear without significant interval change.  No focal areas of consolidation.  No pneumothorax.  Multilevel degenerative changes of the imaged spine. No acute or aggressive appearing osseous lesion.  Limited images through the upper abdomen demonstrate ablation changes of the right kidney.  No acute abnormality.  Review of the MIP images confirms the above findings.  IMPRESSION: No pulmonary embolism.  A few small pulmonary nodules, some of which have increased slightly in size.  Metastatic disease is not excluded.  Original Report Authenticated By: Suanne Marker, M.D.     1. Abdominal pain   2. Chronic pain   3. Lung nodule      MDM  1:40 AM patient seen and evaluated. Patient in no acute distress.   Pt feeling better after pain meds.  Labs and CT unremarkable for acute  findings.  Pt states he is ready to return home.  Pt will follow up with his doctors.     Martie Lee, [place] 11/25/11 [phone]

## 2011-11-25 NOTE — ED Notes (Signed)
Pt very angry he will not tell me why hes here.  He stated Im tired of telling every dwn body why i came.  He wanted his iv in his hand not in his a-c where its needed for a c-t.Brandon [name]  He threw his clothes off and onto his wife sitting next to him.

## 2011-11-25 NOTE — ED Notes (Signed)
More pain med given

## 2011-11-25 NOTE — ED Notes (Signed)
Iv team called to start his ive.  His wife says there is usually a problem with his veins in his a-c

## 2011-11-26 NOTE — ED Provider Notes (Signed)
Medical screening examination/treatment/procedure(s) were performed by non-physician practitioner and as supervising physician I was immediately available for consultation/collaboration.  Carlisle Beers, MD 11/26/11 [phone]

## 2011-11-28 ENCOUNTER — Other Ambulatory Visit (HOSPITAL_COMMUNITY): Payer: Self-pay | Admitting: Physician Assistant

## 2011-12-01 ENCOUNTER — Encounter (HOSPITAL_COMMUNITY): Payer: Self-pay

## 2011-12-01 ENCOUNTER — Encounter (HOSPITAL_COMMUNITY)
Admission: RE | Admit: 2011-12-01 | Discharge: 2011-12-01 | Disposition: A | Discharge status: Home / Self Care | Payer: Self-pay | Source: Ambulatory Visit | Attending: Interventional Radiology | Admitting: Interventional Radiology

## 2011-12-01 LAB — CBC
Hemoglobin: 14.5 g/dL (ref 13.0–17.0)
MCHC: 34.6 g/dL (ref 30.0–36.0)
RBC: 4.91 MIL/uL (ref 4.22–5.81)
WBC: 5.1 10*3/uL (ref 4.0–10.5)

## 2011-12-01 LAB — BASIC METABOLIC PANEL
GFR calc Af Amer: >90 mL/min (ref >90)
GFR calc non Af Amer: >90 mL/min (ref >90)
Potassium: 4 mEq/L (ref 3.5–5.1)
Sodium: 139 mEq/L (ref 135–145)

## 2011-12-01 LAB — TYPE AND SCREEN: Antibody Screen: NEGATIVE

## 2011-12-01 LAB — PROTIME-INR
INR: 1.2 (ref 0.00–1.49)
Prothrombin Time: 15.5 seconds — ABNORMAL HIGH (ref 11.6–15.2)

## 2011-12-01 NOTE — Patient Instructions (Addendum)
[place]  12/01/2011   Your procedure is scheduled on:  12/02/11  Report to [facility] DEPT at 7:15 AM.  Call this number if you have problems the morning of procedure: [phone]   Remember:   Do not eat food:After Midnight.  May have clear liquids:until Midnight .  Clear liquids include soda, tea, black coffee, apple or grape juice, broth.  Take these medicines the morning of surgery with A SIP OF WATER: oxycodone if needed   Do not wear jewelry, make-up or nail polish.  Do not wear lotions, powders, or perfumes. You may wear deodorant.  Do not shave 48 hours prior to surgery.  Do not bring valuables to the hospital.  Contacts, dentures or bridgework may not be worn into surgery.  Leave suitcase in the car. After surgery it may be brought to your room.  For patients admitted to the hospital, checkout time is 11:00 AM the day of discharge.   Patients discharged the day of surgery will not be allowed to drive home.  Name and phone number of your driver:   Special Instructions: CHG Shower Use Special Wash: 1/2 bottle night before surgery and 1/2 bottle morning of surgery.             TAKE 1/2 DOSE OF INSULIN TONIGHT

## 2011-12-02 ENCOUNTER — Observation Stay (HOSPITAL_COMMUNITY)
Admission: RE | Admit: 2011-12-02 | Discharge: 2011-12-03 | Disposition: A | Discharge status: Home / Self Care | Payer: BC Managed Care – PPO | Source: Ambulatory Visit | Attending: Interventional Radiology | Admitting: Interventional Radiology

## 2011-12-02 ENCOUNTER — Encounter (HOSPITAL_COMMUNITY): Payer: Self-pay | Admitting: Anesthesiology

## 2011-12-02 ENCOUNTER — Encounter (HOSPITAL_COMMUNITY)
Admission: RE | Disposition: A | Discharge status: Home / Self Care | Payer: Self-pay | Source: Ambulatory Visit | Attending: Interventional Radiology

## 2011-12-02 ENCOUNTER — Ambulatory Visit (HOSPITAL_COMMUNITY)
Admission: RE | Admit: 2011-12-02 | Discharge: 2011-12-02 | Disposition: A | Discharge status: Home / Self Care | Payer: BC Managed Care – PPO | Source: Ambulatory Visit | Attending: Interventional Radiology | Admitting: Interventional Radiology

## 2011-12-02 ENCOUNTER — Ambulatory Visit (HOSPITAL_COMMUNITY): Payer: BC Managed Care – PPO | Admitting: Anesthesiology

## 2011-12-02 VITALS — BP 153/98 | HR 79 | Temp 98.7°F | Resp 18

## 2011-12-02 DIAGNOSIS — I10 Essential (primary) hypertension: Secondary | ICD-10-CM | POA: Insufficient documentation

## 2011-12-02 DIAGNOSIS — Z794 Long term (current) use of insulin: Secondary | ICD-10-CM | POA: Insufficient documentation

## 2011-12-02 DIAGNOSIS — R109 Unspecified abdominal pain: Secondary | ICD-10-CM | POA: Insufficient documentation

## 2011-12-02 DIAGNOSIS — D4959 Neoplasm of unspecified behavior of other genitourinary organ: Secondary | ICD-10-CM | POA: Insufficient documentation

## 2011-12-02 DIAGNOSIS — C649 Malignant neoplasm of unspecified kidney, except renal pelvis: Principal | ICD-10-CM | POA: Insufficient documentation

## 2011-12-02 DIAGNOSIS — E119 Type 2 diabetes mellitus without complications: Secondary | ICD-10-CM | POA: Insufficient documentation

## 2011-12-02 DIAGNOSIS — N2889 Other specified disorders of kidney and ureter: Secondary | ICD-10-CM

## 2011-12-02 DIAGNOSIS — Z79899 Other long term (current) drug therapy: Secondary | ICD-10-CM | POA: Insufficient documentation

## 2011-12-02 LAB — GLUCOSE, CAPILLARY
Glucose-Capillary: 159 mg/dL — ABNORMAL HIGH (ref 70–99)
Glucose-Capillary: 97 mg/dL (ref 70–99)

## 2011-12-02 SURGERY — RADIO FREQUENCY ABLATION
Anesthesia: General | Site: Pelvis | Laterality: Left | Wound class: Clean

## 2011-12-02 MED ORDER — PROMETHAZINE HCL 25 MG/ML IJ SOLN
6.2500 mg | INTRAMUSCULAR | Status: DC | PRN
  Filled 2011-12-02: qty 1

## 2011-12-02 MED ORDER — MIDAZOLAM HCL 5 MG/5ML IJ SOLN
INTRAMUSCULAR | Status: DC | PRN
  Administered 2011-12-02: 2 mg via INTRAVENOUS

## 2011-12-02 MED ORDER — HYDROMORPHONE HCL PF 1 MG/ML IJ SOLN
INTRAMUSCULAR | Status: AC
  Filled 2011-12-02: qty 1

## 2011-12-02 MED ORDER — ONDANSETRON HCL 4 MG/2ML IJ SOLN
INTRAMUSCULAR | Status: DC | PRN
  Administered 2011-12-02 (×2): 2 mg via INTRAVENOUS

## 2011-12-02 MED ORDER — NALOXONE HCL 0.4 MG/ML IJ SOLN: 0.4000 mg | INTRAMUSCULAR | Status: DC | PRN

## 2011-12-02 MED ORDER — DIPHENHYDRAMINE HCL 50 MG/ML IJ SOLN: 12.5000 mg | Freq: Four times a day (QID) | INTRAMUSCULAR | Status: DC | PRN

## 2011-12-02 MED ORDER — HYDROMORPHONE HCL PF 1 MG/ML IJ SOLN
0.2500 mg | INTRAMUSCULAR | Status: DC | PRN
  Administered 2011-12-02 (×2): 0.5 mg via INTRAVENOUS

## 2011-12-02 MED ORDER — SENNOSIDES-DOCUSATE SODIUM 8.6-50 MG PO TABS
1.0000 | ORAL_TABLET | Freq: Every day | ORAL | Status: DC | PRN
  Filled 2011-12-02: qty 1

## 2011-12-02 MED ORDER — LACTATED RINGERS IV SOLN
INTRAVENOUS | Status: DC
Start: 2011-12-02 — End: 2011-12-02

## 2011-12-02 MED ORDER — CISATRACURIUM BESYLATE 2 MG/ML IV SOLN
INTRAVENOUS | Status: DC | PRN
  Administered 2011-12-02: 5 mg via INTRAVENOUS
  Administered 2011-12-02: 2 mg via INTRAVENOUS
  Administered 2011-12-02 (×3): 1 mg via INTRAVENOUS
  Administered 2011-12-02: 3 mg via INTRAVENOUS
  Administered 2011-12-02: 1 mg via INTRAVENOUS
  Administered 2011-12-02 (×2): 2 mg via INTRAVENOUS
  Administered 2011-12-02: 10 mg via INTRAVENOUS
  Administered 2011-12-02: 2 mg via INTRAVENOUS

## 2011-12-02 MED ORDER — PROMETHAZINE HCL 25 MG/ML IJ SOLN: 12.5000 mg | INTRAMUSCULAR | Status: DC | PRN

## 2011-12-02 MED ORDER — CEFAZOLIN SODIUM-DEXTROSE 2-3 GM-% IV SOLR
2.0000 g | Freq: Once | INTRAVENOUS | Status: AC
  Administered 2011-12-02: 2 g via INTRAVENOUS
  Filled 2011-12-02: qty 50

## 2011-12-02 MED ORDER — PHENYLEPHRINE HCL 10 MG/ML IJ SOLN
INTRAMUSCULAR | Status: DC | PRN
  Administered 2011-12-02: 10 ug via INTRAVENOUS
  Administered 2011-12-02: 5 ug via INTRAVENOUS

## 2011-12-02 MED ORDER — FENTANYL CITRATE 0.05 MG/ML IJ SOLN
INTRAMUSCULAR | Status: DC | PRN
  Administered 2011-12-02: 50 ug via INTRAVENOUS
  Administered 2011-12-02: 100 ug via INTRAVENOUS
  Administered 2011-12-02: 50 ug via INTRAVENOUS
  Administered 2011-12-02 (×2): 25 ug via INTRAVENOUS

## 2011-12-02 MED ORDER — KETOROLAC TROMETHAMINE 30 MG/ML IJ SOLN
30.0000 mg | Freq: Four times a day (QID) | INTRAMUSCULAR | Status: DC
  Administered 2011-12-02 – 2011-12-03 (×3): 30 mg via INTRAVENOUS
  Filled 2011-12-02 (×7): qty 1

## 2011-12-02 MED ORDER — INSULIN ASPART 100 UNIT/ML ~~LOC~~ SOLN
0.0000 [IU] | Freq: Three times a day (TID) | SUBCUTANEOUS | Status: DC
  Administered 2011-12-03: 2 [IU] via SUBCUTANEOUS
  Filled 2011-12-02: qty 3

## 2011-12-02 MED ORDER — DOCUSATE SODIUM 100 MG PO CAPS
100.0000 mg | ORAL_CAPSULE | Freq: Two times a day (BID) | ORAL | Status: DC
  Administered 2011-12-02 (×2): 100 mg via ORAL
  Filled 2011-12-02 (×4): qty 1

## 2011-12-02 MED ORDER — PROPOFOL 10 MG/ML IV EMUL
INTRAVENOUS | Status: DC | PRN
  Administered 2011-12-02: 200 mg via INTRAVENOUS

## 2011-12-02 MED ORDER — HYDROMORPHONE 0.3 MG/ML IV SOLN
INTRAVENOUS | Status: DC
  Administered 2011-12-02: 0.3 mg via INTRAVENOUS
  Administered 2011-12-02: 18:00:00 via INTRAVENOUS
  Administered 2011-12-03: 0.6 mg via INTRAVENOUS
  Administered 2011-12-03: 0.9 mg via INTRAVENOUS
  Filled 2011-12-02: qty 25

## 2011-12-02 MED ORDER — HYDROMORPHONE HCL PF 2 MG/ML IJ SOLN: 1.0000 mg | Freq: Once | INTRAMUSCULAR | Status: DC

## 2011-12-02 MED ORDER — LACTATED RINGERS IV SOLN: INTRAVENOUS | Status: DC

## 2011-12-02 MED ORDER — SUCCINYLCHOLINE CHLORIDE 20 MG/ML IJ SOLN
INTRAMUSCULAR | Status: DC | PRN
  Administered 2011-12-02: 100 mg via INTRAVENOUS

## 2011-12-02 MED ORDER — CEFAZOLIN SODIUM-DEXTROSE 2-3 GM-% IV SOLR
INTRAVENOUS | Status: AC
  Filled 2011-12-02: qty 50

## 2011-12-02 MED ORDER — ONDANSETRON HCL 4 MG/2ML IJ SOLN
4.0000 mg | Freq: Four times a day (QID) | INTRAMUSCULAR | Status: DC
  Administered 2011-12-02 – 2011-12-03 (×2): 4 mg via INTRAVENOUS
  Filled 2011-12-02 (×2): qty 2

## 2011-12-02 MED ORDER — SODIUM CHLORIDE 0.45 % IV SOLN
INTRAVENOUS | Status: DC
  Administered 2011-12-02: 17:00:00 via INTRAVENOUS

## 2011-12-02 MED ORDER — PROMETHAZINE HCL 25 MG/ML IJ SOLN: 6.2500 mg | INTRAMUSCULAR | Status: DC | PRN

## 2011-12-02 MED ORDER — IOHEXOL 300 MG/ML  SOLN
100.0000 mL | Freq: Once | INTRAMUSCULAR | Status: AC | PRN
  Administered 2011-12-02: 100 mL via INTRAVENOUS

## 2011-12-02 MED ORDER — HYDROCODONE-ACETAMINOPHEN 5-325 MG PO TABS
1.0000 | ORAL_TABLET | ORAL | Status: DC | PRN
  Administered 2011-12-02 – 2011-12-03 (×2): 2 via ORAL
  Filled 2011-12-02 (×2): qty 2

## 2011-12-02 MED ORDER — HYDROMORPHONE HCL PF 1 MG/ML IJ SOLN
INTRAMUSCULAR | Status: DC | PRN
  Administered 2011-12-02 (×4): 0.5 mg via INTRAVENOUS

## 2011-12-02 MED ORDER — SODIUM CHLORIDE 0.9 % IJ SOLN: 9.0000 mL | INTRAMUSCULAR | Status: DC | PRN

## 2011-12-02 MED ORDER — DIPHENHYDRAMINE HCL 12.5 MG/5ML PO ELIX: 12.5000 mg | ORAL_SOLUTION | Freq: Four times a day (QID) | ORAL | Status: DC | PRN

## 2011-12-02 MED ORDER — LACTATED RINGERS IV SOLN
INTRAVENOUS | Status: DC
  Administered 2011-12-02: 08:00:00 via INTRAVENOUS

## 2011-12-02 MED ORDER — HYDROMORPHONE HCL PF 1 MG/ML IJ SOLN
INTRAMUSCULAR | Status: AC
  Administered 2011-12-02: 1 mg
  Filled 2011-12-02: qty 1

## 2011-12-02 MED ORDER — LACTATED RINGERS IV SOLN
INTRAVENOUS | Status: DC | PRN
  Administered 2011-12-02 (×2): via INTRAVENOUS

## 2011-12-02 MED ORDER — HYDROMORPHONE HCL PF 2 MG/ML IJ SOLN
0.2500 mg | INTRAMUSCULAR | Status: DC | PRN
  Filled 2011-12-02: qty 1

## 2011-12-02 MED ORDER — ONDANSETRON HCL 4 MG/2ML IJ SOLN: 4.0000 mg | Freq: Four times a day (QID) | INTRAMUSCULAR | Status: DC | PRN

## 2011-12-02 MED ORDER — EPHEDRINE SULFATE 50 MG/ML IJ SOLN
INTRAMUSCULAR | Status: DC | PRN
  Administered 2011-12-02: 2.5 mg via INTRAVENOUS
  Administered 2011-12-02 (×2): 5 mg via INTRAVENOUS
  Administered 2011-12-02: 2.5 mg via INTRAVENOUS
  Administered 2011-12-02: 10 mg via INTRAVENOUS
  Administered 2011-12-02: 5 mg via INTRAVENOUS

## 2011-12-02 MED ORDER — LIDOCAINE HCL (CARDIAC) 20 MG/ML IV SOLN
INTRAVENOUS | Status: DC | PRN
  Administered 2011-12-02: 30 mg via INTRAVENOUS

## 2011-12-02 NOTE — H&P (Signed)
Agree.  Pt seen and examined.  Left renal ablation with biopsy today.  Overnight observation.

## 2011-12-02 NOTE — Anesthesia Preprocedure Evaluation (Addendum)
Anesthesia Evaluation  Patient identified by MRN, date of birth, ID band Patient awake    Reviewed: Allergy & Precautions, H&P , NPO status , Patient's Chart, lab work & pertinent test results  Airway Mallampati: II TM Distance: >3 FB Neck ROM: full    Dental No notable dental hx. (+) Teeth Intact and Dental Advisory Given   Pulmonary neg pulmonary ROS,  clear to auscultation  Pulmonary exam normal       Cardiovascular Exercise Tolerance: Good hypertension, On Medications neg cardio ROS regular Normal    Neuro/Psych Negative Neurological ROS  Negative Psych ROS   GI/Hepatic negative GI ROS, Neg liver ROS,   Endo/Other  Negative Endocrine ROSDiabetes mellitus-, Well Controlled, Type 2, Insulin Dependent  Renal/GU negative Renal ROS  Genitourinary negative   Musculoskeletal   Abdominal   Peds  Hematology negative hematology ROS (+)   Anesthesia Other Findings   Reproductive/Obstetrics negative OB ROS                          Anesthesia Physical Anesthesia Plan  ASA: III  Anesthesia Plan: General   Post-op Pain Management:    Induction: Intravenous  Airway Management Planned: Oral ETT  Additional Equipment:   Intra-op Plan:   Post-operative Plan: Extubation in OR  Informed Consent: I have reviewed the patients History and Physical, chart, labs and discussed the procedure including the risks, benefits and alternatives for the proposed anesthesia with the patient or authorized representative who has indicated his/her understanding and acceptance.   Dental Advisory Given  Plan Discussed with: CRNA and Surgeon  Anesthesia Plan Comments:         Anesthesia Quick Evaluation

## 2011-12-02 NOTE — Transfer of Care (Signed)
Immediate Anesthesia Transfer of Care Note  Patient: Brandon [name]  Procedure(s) Performed:  RADIO FREQUENCY ABLATION - Left Renal Cryo Ablation  Patient Location: PACU  Anesthesia Type: General  Level of Consciousness: awake and alert   Airway & Oxygen Therapy: Patient Spontanous Breathing and Patient connected to face mask  Post-op Assessment: Report given to PACU RN and Post -op Vital signs reviewed and stable  Post vital signs: Reviewed and stable  Complications: No apparent anesthesia complications

## 2011-12-02 NOTE — Progress Notes (Signed)
Day of Surgery  Subjective: Moderate left flank pain.  Objective: Vital signs in last 24 hours: Temp:  [97.4 F (36.3 C)-98.7 F (37.1 C)] 97.7 F (36.5 C) (12/28 1606) Pulse Rate:  [70-91] 86  (12/28 1606) Resp:  [10-20] 18  (12/28 1606) BP: (124-153)/(67-98) 138/85 mmHg (12/28 1606) SpO2:  [97 %-100 %] 99 % (12/28 1606) Weight:  [251 lb 5 oz (113.995 kg)] 251 lb 5 oz (113.995 kg) (12/28 1330)    Intake/Output from previous day:   Intake/Output this shift: Total I/O In: 1700 [I.V.:1700] Out: 305 [Urine:300; Blood:5]  Exam per Dr. Kathlene Cote  Lab Results:   [facility] 12/01/11 1553  WBC 5.1  HGB 14.5  HCT 41.9  PLT 255   BMET  Basename 12/01/11 1553  NA 139  K 4.0  CL 99  CO2 32  GLUCOSE 93  BUN 14  CREATININE 0.92  CALCIUM 10.1   PT/INR  Basename 12/01/11 1553  LABPROT 15.5*  INR 1.20   ABG No results found for this basename: PHART:2,PCO2:2,PO2:2,HCO3:2 in the last 72 hours  Studies/Results: Ct Guide Tissue Ablation  12/02/2011  *RADIOLOGY REPORT*  Clinical Data:  Status post percutaneous cryoablation of biopsy proven right renal cell carcinoma on 09/30/2011.  The patient also has a tumor of the left kidney and now presents for biopsy and ablation.  CT-GUIDED CORE BIOPSY OF LEFT RENAL TUMOR. CT-GUIDED PERCUTANEOUS CRYOABLATION OF LEFT RENAL TUMOR.  Anesthesia:  General  Medications:  2 grams IV Ancef. As antibiotic prophylaxis, Ancef 2 gm was ordered pre-procedure and administered intravenously within one hour of incision.  Contrast:  75 ml Omnipaque-300 IV  Procedure:  The procedure, risks, benefits, and alternatives were explained to the patient.  Questions regarding the procedure were encouraged and answered.  The patient understands and consents to the procedure.  The patient was placed under general anesthesia.  Initial unenhanced CT was performed in a prone position to localize the left renal tumor.  The left translumbar region was prepped with Betadine in  a sterile fashion, and a sterile drape was applied covering the operative field.  A sterile gown and sterile gloves were used for the procedure.  IV contrast was administered to better localize margins of the tumor.  A 17 gauge trocar needle was advanced into the left renal tumor.  Core biopsy was performed with an 18 gauge automated core biopsy device.  A total of 2 core samples were submitted in formalin for pathologic analysis.  Under CT guidance, a total of four Galil Ice Rod Plus percutaneous cryoablation probes were advanced into the left renal tumor.  Probe positioning was confirmed by CT prior to cryoablation.  Cryoablation was performed through the four probes simultaneously. Initial 10 minute cycle of cryoablation was performed.  This was followed by a 8 minute thaw cycle.  A second 10 minute cycle of cryoablation was then performed.  During ablation, periodic CT imaging was performed to monitor ice ball formation and morphology. After active thaw, the cryoablation probes were removed.  Post-procedural CT was performed.  Complications: None  Findings:  Initial unenhanced CT was performed.  The posterior left renal tumor originating from the mid to lower kidney is poorly demarcated by unenhanced CT due to its endophytic nature.  IV contrast was therefore utilized which outlines the tumor well. Maximal tumor diameter is approximately 2.8 x 3.5 cm.  Based on tumor size and shape, it was elected to place four probes into the tumor in roughly a rectangular array.  Prior  to ablation, core biopsy revealed solid tissue.  During cryoablation, CT was performed demonstrating adequate ice ball formation encompassing the tumor.  IMPRESSION:  CT guided percutaneous core biopsy and cryoablation of left renal tumor.  The patient will be observed overnight.  Initial follow-up will be performed in approximately 4 weeks.  Original Report Authenticated By: Azzie Roup, M.D.   Ct Biopsy  12/02/2011  *RADIOLOGY  REPORT*  Clinical Data:  Status post percutaneous cryoablation of biopsy proven right renal cell carcinoma on 09/30/2011.  The patient also has a tumor of the left kidney and now presents for biopsy and ablation.  CT-GUIDED CORE BIOPSY OF LEFT RENAL TUMOR. CT-GUIDED PERCUTANEOUS CRYOABLATION OF LEFT RENAL TUMOR.  Anesthesia:  General  Medications:  2 grams IV Ancef. As antibiotic prophylaxis, Ancef 2 gm was ordered pre-procedure and administered intravenously within one hour of incision.  Contrast:  75 ml Omnipaque-300 IV  Procedure:  The procedure, risks, benefits, and alternatives were explained to the patient.  Questions regarding the procedure were encouraged and answered.  The patient understands and consents to the procedure.  The patient was placed under general anesthesia.  Initial unenhanced CT was performed in a prone position to localize the left renal tumor.  The left translumbar region was prepped with Betadine in a sterile fashion, and a sterile drape was applied covering the operative field.  A sterile gown and sterile gloves were used for the procedure.  IV contrast was administered to better localize margins of the tumor.  A 17 gauge trocar needle was advanced into the left renal tumor.  Core biopsy was performed with an 18 gauge automated core biopsy device.  A total of 2 core samples were submitted in formalin for pathologic analysis.  Under CT guidance, a total of four Galil Ice Rod Plus percutaneous cryoablation probes were advanced into the left renal tumor.  Probe positioning was confirmed by CT prior to cryoablation.  Cryoablation was performed through the four probes simultaneously. Initial 10 minute cycle of cryoablation was performed.  This was followed by a 8 minute thaw cycle.  A second 10 minute cycle of cryoablation was then performed.  During ablation, periodic CT imaging was performed to monitor ice ball formation and morphology. After active thaw, the cryoablation probes were  removed.  Post-procedural CT was performed.  Complications: None  Findings:  Initial unenhanced CT was performed.  The posterior left renal tumor originating from the mid to lower kidney is poorly demarcated by unenhanced CT due to its endophytic nature.  IV contrast was therefore utilized which outlines the tumor well. Maximal tumor diameter is approximately 2.8 x 3.5 cm.  Based on tumor size and shape, it was elected to place four probes into the tumor in roughly a rectangular array.  Prior to ablation, core biopsy revealed solid tissue.  During cryoablation, CT was performed demonstrating adequate ice ball formation encompassing the tumor.  IMPRESSION:  CT guided percutaneous core biopsy and cryoablation of left renal tumor.  The patient will be observed overnight.  Initial follow-up will be performed in approximately 4 weeks.  Original Report Authenticated By: Azzie Roup, M.D.    Anti-infectives: Anti-infectives    None      Assessment/Plan: s/p Procedure(s): RADIO FREQUENCY ABLATION Dilaudid PCA and Toradol. Probable discharge in AM  LOS: 0 days    Brandon [name] 12/02/2011

## 2011-12-02 NOTE — H&P (Signed)
Brandon [name] is an 50 y.o. male.   Chief Complaint: Bilateral renal carcinoma tumors HPI: Very pleasant 50 yo male with bilateral renal carcinoma that was picked up on a CT scan when the patient was being treated for diverticulitis. He was referred to [name] for possible cryo ablation of the lesions. [name] saw the patient in consultation on 08/11/11 and the patient subsequently underwent cryoablation of the right tumor on 09/30/11 which he tolerated well. He now returns for treatment of the left tumor as part of the staged treatment plan.  Past Medical History  Diagnosis Date  . Hypertension   . Diabetes mellitus   . Bilateral renal masses   . Diverticulitis   . Kidney tumor   Lung nodules by CT   Past Surgical History  Procedure Date  . Kidney surgery     No family history on file. Social History:  reports that he has never smoked. He has never used smokeless tobacco. He reports that he does not drink alcohol or use illicit drugs.  Allergies: No Known Allergies  Medications Prior to Admission  Medication Dose Route Frequency Provider Last Rate Last Dose  . ceFAZolin (ANCEF) IVPB 2 g/50 mL premix  2 g Intravenous Once Azzie Roup      . HYDROmorphone (DILAUDID) injection 0.3-0.5 mg  0.3-0.5 mg Intravenous Q5 min PRN Peyton Najjar, MD      . lactated ringers infusion   Intravenous Continuous [place] Breeze, PA 20 mL/hr at 12/02/11 0730    . lactated ringers infusion   Intravenous Continuous Peyton Najjar, MD      . promethazine (PHENERGAN) injection 6.25-12.5 mg  6.25-12.5 mg Intravenous Q15 min PRN Peyton Najjar, MD       Medications Prior to Admission  Medication Sig Dispense Refill  . insulin NPH-insulin regular (NOVOLIN 70/30) (70-30) 100 UNIT/ML injection Inject 40 Units into the skin 2 (two) times daily with a meal.       . oxyCODONE-acetaminophen (PERCOCET) 5-325 MG per tablet Take 1-2 tablets by mouth every 6 (six) hours as needed for pain.  20  tablet  0  . telmisartan (MICARDIS) 40 MG tablet Take 40 mg by mouth every morning.         Results for orders placed during the hospital encounter of 12/01/11 (from the past 48 hour(s))  APTT     Status: Normal   Collection Time   12/01/11  3:53 PM      Component Value Range Comment   aPTT 24  24 - 37 (seconds)   BASIC METABOLIC PANEL     Status: Normal   Collection Time   12/01/11  3:53 PM      Component Value Range Comment   Sodium 139  135 - 145 (mEq/L)    Potassium 4.0  3.5 - 5.1 (mEq/L)    Chloride 99  96 - 112 (mEq/L)    CO2 32  19 - 32 (mEq/L)    Glucose, Bld 93  70 - 99 (mg/dL)    BUN 14  6 - 23 (mg/dL)    Creatinine, Ser 0.92  0.50 - 1.35 (mg/dL)    Calcium 10.1  8.4 - 10.5 (mg/dL)    GFR calc non Af Amer >90  >90 (mL/min)    GFR calc Af Amer >90  >90 (mL/min)   CBC     Status: Normal   Collection Time   12/01/11  3:53 PM      Component  Value Range Comment   WBC 5.1  4.0 - 10.5 (K/uL)    RBC 4.91  4.22 - 5.81 (MIL/uL)    Hemoglobin 14.5  13.0 - 17.0 (g/dL)    HCT 41.9  39.0 - 52.0 (%)    MCV 85.3  78.0 - 100.0 (fL)    MCH 29.5  26.0 - 34.0 (pg)    MCHC 34.6  30.0 - 36.0 (g/dL)    RDW 11.7  11.5 - 15.5 (%)    Platelets 255  150 - 400 (K/uL)   PROTIME-INR     Status: Abnormal   Collection Time   12/01/11  3:53 PM      Component Value Range Comment   Prothrombin Time 15.5 (*) 11.6 - 15.2 (seconds)    INR 1.20  0.00 - 1.49    TYPE AND SCREEN     Status: Normal   Collection Time   12/01/11  3:53 PM      Component Value Range Comment   ABO/RH(D) B POS      Antibody Screen NEG      Sample Expiration 12/04/2011      No results found.  Review of Systems  Constitutional: Negative for fever, chills and weight loss.  Respiratory: Negative for cough, sputum production, shortness of breath and wheezing.   Cardiovascular: Negative for chest pain and leg swelling.  Genitourinary: Negative for hematuria.  Musculoskeletal:       Left flank pain    Endo/Heme/Allergies: Does not bruise/bleed easily.    Blood pressure 153/98, pulse 79, temperature 98.7 F (37.1 C), SpO2 97.00%. Physical Exam  Heent - unremarkable - airway 1 Heart - RRR Lungs - clear Abd - soft NT except left flank discomfort Exts - pulses intact - no edema  Assessment/Plan Left renal tumor cryoablation today by Dr. Kathlene Cote under general anesthesia as the second part of a staged procedure for bilateral renal carcinoma.  Jasslyn Finkel 12/02/2011, 8:05 AM

## 2011-12-02 NOTE — Anesthesia Postprocedure Evaluation (Signed)
  Anesthesia Post-op Note  Patient: Brandon [name]  Procedure(s) Performed:  RADIO FREQUENCY ABLATION - Left Renal Cryo Ablation  Patient Location: PACU  Anesthesia Type: General  Level of Consciousness: awake and alert   Airway and Oxygen Therapy: Patient Spontanous Breathing  Post-op Pain: mild  Post-op Assessment: Post-op Vital signs reviewed, Patient's Cardiovascular Status Stable, Respiratory Function Stable, Patent Airway and No signs of Nausea or vomiting  Post-op Vital Signs: stable  Complications: No apparent anesthesia complications

## 2011-12-02 NOTE — Procedures (Signed)
Procedure:  CT guided core biopsy and percutaneous cryoablation of left renal tumor Anesthesia:  General Contrast:  75 mL Omni 300 IV Findings:  Left renal tumor localized.  Core biopsy x 2 w/ 18 G device via 17 G needle.  Perc cryoablation via 4 separate Galil Ice Rod Plus probes.  Two 10 min freeze cycles separated by 8 min thaw.  Adequate ice ball formation. Plan:  PACU recovery, overnight observation.

## 2011-12-03 LAB — CBC
HCT: 37.4 % — ABNORMAL LOW (ref 39.0–52.0)
Hemoglobin: 12.9 g/dL — ABNORMAL LOW (ref 13.0–17.0)
MCHC: 34.5 g/dL (ref 30.0–36.0)
Platelets: 188 10*3/uL (ref 150–400)
RBC: 4.34 MIL/uL (ref 4.22–5.81)
RDW: 11.8 % (ref 11.5–15.5)

## 2011-12-03 LAB — GLUCOSE, CAPILLARY

## 2011-12-03 LAB — BASIC METABOLIC PANEL
CO2: 29 mEq/L (ref 19–32)
Chloride: 104 mEq/L (ref 96–112)
GFR calc non Af Amer: 79 mL/min — ABNORMAL LOW (ref >90)
Glucose, Bld: 186 mg/dL — ABNORMAL HIGH (ref 70–99)
Potassium: 4 mEq/L (ref 3.5–5.1)
Sodium: 140 mEq/L (ref 135–145)

## 2011-12-03 LAB — DIFFERENTIAL
Basophils Absolute: 0 10*3/uL (ref 0.0–0.1)
Basophils Relative: 0 % (ref 0–1)
Eosinophils Relative: 2 % (ref 0–5)
Lymphocytes Relative: 32 % (ref 12–46)
Monocytes Absolute: 0.7 10*3/uL (ref 0.1–1.0)
Neutro Abs: 3.3 10*3/uL (ref 1.7–7.7)

## 2011-12-03 NOTE — Discharge Summary (Signed)
Physician Discharge Summary  Patient ID: Brandon [name] MRN: [id number] DOB/AGE: September 04, 1961 50 y.o.  Admit date: 12/02/2011 Discharge date: 12/03/2011  Admission Diagnoses:Bilateral Renal Ca  Discharge Diagnoses: Bilateral renal Carcinoma   Discharged Condition: stable  Hospital Course: Pt has hx of Bilateral renal Ca. Right renal Ca Cryoablation performed [id number] without complication; Left renal Ca Cryoablation performed [id number] without complication. Overnight stay has been uneventful. Pt has had no N/V. Some Left flank pain but better this am. Urinating well; clear and yellow Eating and sleeping well. Ambulating without problem.  Consults: None  Significant Diagnostic Studies: Cryoablation L renal Ca  Treatments: Cryoablation L renal Ca  Discharge Exam: Blood pressure 116/74, pulse 77, temperature 98.8 F (37.1 C), temperature source Oral, resp. rate 18, height 6\' 1"  (1.854 m), weight 251 lb 5 oz (113.995 kg), SpO2 100.00%.   PE:  Heart: RRR w/o M Lungs: CTA Abd: +BS; NT; slight tender at Left flank; no swelling;no bleeding; bandaid clean and dry FROM Neuro intact Urine clear and yellow; good output (900cc this am) VSS: afeb; 116/74  Results for orders placed during the hospital encounter of 12/02/11  GLUCOSE, CAPILLARY      Component Value Range   Glucose-Capillary 124 (*) 70 - 99 (mg/dL)  GLUCOSE, CAPILLARY      Component Value Range   Glucose-Capillary 159 (*) 70 - 99 (mg/dL)  BASIC METABOLIC PANEL      Component Value Range   Sodium 140  135 - 145 (mEq/L)   Potassium 4.0  3.5 - 5.1 (mEq/L)   Chloride 104  96 - 112 (mEq/L)   CO2 29  19 - 32 (mEq/L)   Glucose, Bld 186 (*) 70 - 99 (mg/dL)   BUN 19  6 - 23 (mg/dL)   Creatinine, Ser 1.09  0.50 - 1.35 (mg/dL)   Calcium 8.9  8.4 - 10.5 (mg/dL)   GFR calc non Af Amer 79 (*) >90 (mL/min)   GFR calc Af Amer >90  >90 (mL/min)  CBC      Component Value Range   WBC 6.0  4.0 - 10.5 (K/uL)   RBC 4.34  4.22 - 5.81  (MIL/uL)   Hemoglobin 12.9 (*) 13.0 - 17.0 (g/dL)   HCT 37.4 (*) 39.0 - 52.0 (%)   MCV 86.2  78.0 - 100.0 (fL)   MCH 29.7  26.0 - 34.0 (pg)   MCHC 34.5  30.0 - 36.0 (g/dL)   RDW 11.8  11.5 - 15.5 (%)   Platelets 188  150 - 400 (K/uL)  DIFFERENTIAL      Component Value Range   Neutrophils Relative 55  43 - 77 (%)   Neutro Abs 3.3  1.7 - 7.7 (K/uL)   Lymphocytes Relative 32  12 - 46 (%)   Lymphs Abs 1.9  0.7 - 4.0 (K/uL)   Monocytes Relative 11  3 - 12 (%)   Monocytes Absolute 0.7  0.1 - 1.0 (K/uL)   Eosinophils Relative 2  0 - 5 (%)   Eosinophils Absolute 0.1  0.0 - 0.7 (K/uL)   Basophils Relative 0  0 - 1 (%)   Basophils Absolute 0.0  0.0 - 0.1 (K/uL)  GLUCOSE, CAPILLARY      Component Value Range   Glucose-Capillary 97  70 - 99 (mg/dL)     Disposition: pt being discharged to home today. Have discussed findings with Dr Anselm Pancoast. Pt will hear from [facility] for f/u appt and/or imaging Call if need anything Rx for Vicodin #20  given to pt at DC; 1-2 every 4-6 hrs as needed for pain  Discharge Orders    Future Orders Please Complete By Expires   Diet - low sodium heart healthy      Increase activity slowly      Discharge instructions      Comments:   [facility] will call pt with F/U visit and/or imaging   Driving Restrictions      Comments:   No driving x 3 days   Lifting restrictions      Comments:   No lifting over 10 lbs x 2 days   Remove dressing in 24 hours      Scheduling Instructions:   New bandaid every 24 hrs x 3 days   Call MD for:  temperature >100.4      Call MD for:  persistant nausea and vomiting      Call MD for:  severe uncontrolled pain      Call MD for:  redness, tenderness, or signs of infection (pain, swelling, redness, odor or green/yellow discharge around incision site)        Medication List  As of 12/03/2011  8:29 AM   CONTINUE taking these medications         insulin NPH-insulin regular (70-30) 100 UNIT/ML  injection   Commonly known as: NOVOLIN 70/30      telmisartan 40 MG tablet   Commonly known as: MICARDIS         STOP taking these medications         oxyCODONE-acetaminophen 5-325 MG per tablet             Signed: Kaylor Maiers A 12/03/2011, 8:29 AM

## 2011-12-04 NOTE — Discharge Summary (Signed)
Agree 

## 2011-12-05 ENCOUNTER — Other Ambulatory Visit: Payer: Self-pay | Admitting: Interventional Radiology

## 2011-12-05 ENCOUNTER — Other Ambulatory Visit (HOSPITAL_COMMUNITY): Payer: Self-pay | Admitting: Interventional Radiology

## 2011-12-05 DIAGNOSIS — N2889 Other specified disorders of kidney and ureter: Secondary | ICD-10-CM

## 2011-12-07 ENCOUNTER — Other Ambulatory Visit: Payer: Self-pay | Admitting: Neurology

## 2011-12-07 ENCOUNTER — Other Ambulatory Visit (HOSPITAL_COMMUNITY): Payer: Self-pay | Admitting: Interventional Radiology

## 2011-12-07 ENCOUNTER — Telehealth: Payer: Self-pay | Admitting: Emergency Medicine

## 2011-12-07 ENCOUNTER — Ambulatory Visit (HOSPITAL_COMMUNITY)
Admission: RE | Admit: 2011-12-07 | Discharge: 2011-12-07 | Disposition: A | Discharge status: Home / Self Care | Payer: Self-pay | Source: Ambulatory Visit | Attending: Interventional Radiology | Admitting: Interventional Radiology

## 2011-12-07 DIAGNOSIS — R109 Unspecified abdominal pain: Secondary | ICD-10-CM | POA: Insufficient documentation

## 2011-12-07 DIAGNOSIS — C649 Malignant neoplasm of unspecified kidney, except renal pelvis: Secondary | ICD-10-CM

## 2011-12-07 DIAGNOSIS — Z01812 Encounter for preprocedural laboratory examination: Secondary | ICD-10-CM | POA: Insufficient documentation

## 2011-12-07 DIAGNOSIS — R6883 Chills (without fever): Secondary | ICD-10-CM | POA: Insufficient documentation

## 2011-12-07 DIAGNOSIS — D4959 Neoplasm of unspecified behavior of other genitourinary organ: Secondary | ICD-10-CM | POA: Insufficient documentation

## 2011-12-07 LAB — URINALYSIS, ROUTINE W REFLEX MICROSCOPIC
Bilirubin Urine: NEGATIVE
Ketones, ur: NEGATIVE mg/dL
Nitrite: NEGATIVE
Protein, ur: NEGATIVE mg/dL
Urobilinogen, UA: 0.2 mg/dL (ref 0.0–1.0)

## 2011-12-07 LAB — CBC
HCT: 39.2 % (ref 39.0–52.0)
MCV: 83.6 fL (ref 78.0–100.0)
Platelets: 263 10*3/uL (ref 150–400)
RBC: 4.69 MIL/uL (ref 4.22–5.81)
WBC: 5.6 10*3/uL (ref 4.0–10.5)

## 2011-12-07 LAB — BASIC METABOLIC PANEL
CO2: 31 mEq/L (ref 19–32)
Calcium: 9.7 mg/dL (ref 8.4–10.5)
Glucose, Bld: 185 mg/dL — ABNORMAL HIGH (ref 70–99)
Potassium: 3.9 mEq/L (ref 3.5–5.1)
Sodium: 138 mEq/L (ref 135–145)

## 2011-12-07 LAB — DIFFERENTIAL
Eosinophils Relative: 2 % (ref 0–5)
Lymphocytes Relative: 48 % — ABNORMAL HIGH (ref 12–46)
Lymphs Abs: 2.7 10*3/uL (ref 0.7–4.0)

## 2011-12-07 MED ORDER — HYDROMORPHONE HCL 8 MG PO TABS
8.0000 mg | ORAL_TABLET | Freq: Once | ORAL | Status: DC
  Filled 2011-12-07: qty 1

## 2011-12-07 NOTE — Progress Notes (Signed)
  Subjective: This is a pleasant 51 yo male S/P Cryoablation of left renal tumor on Dec 02, 2011. The patient was discharged on Dec 29th in stable condition. The patient contacted [name] today stating he had severe left flank pain, chills, and possible fever. The patient was asked to come to the radiology department today at [facility] for further evaluation.BP  Objective: Vital signs in last 24 hours: BP 145/81  P 75  R 16  T 98.2  Sat 99% RA Heart - RRR Lungs - Clear Abd - Left flank pain to palpation. Wound OK.      Intake/Output from previous day:   Intake/Output this shift:     Lab Results:   Basename 12/07/11 1226  WBC 5.6  HGB 13.8  HCT 39.2  PLT 263   BMET  Basename 12/07/11 1226  NA 138  K 3.9  CL 100  CO2 31  GLUCOSE 185*  BUN 16  CREATININE 0.95  CALCIUM 9.7   PT/INR No results found for this basename: LABPROT:2,INR:2 in the last 72 hours ABG No results found for this basename: PHART:2,PCO2:2,PO2:2,HCO3:2 in the last 72 hours  Studies/Results: No results found.  Anti-infectives: Anti-infectives    None      Assessment/Plan: Dr. Kathlene Cote was contacted regarding patients status, exam, and labs. He recommended symptomatic treatment of pain. The patient received 8 mgs of Dilaudid in radiology and a Rx for Dilaudid 8 mg #30 No Refillsl to be taken 1/2 tab Q6 hours prn pain. He was told to contact us if his condition changed. The patient's fiance was with him and will drive him home. Both expressed understanding of the plan.  LOS: 0 days    Lyllie Cobbins 12/07/2011

## 2011-12-07 NOTE — Telephone Encounter (Signed)
PT DAUGHTER CALLED TO STATE THAT VICODIN IS NOT TOUCHING PAIN, IS AT 10 ON PAIN SCALE.  HE IS HOT TO TOUCH, THEY DONT HAVE A WAY TO TAKE TEMP.  PT HAS BEEN HAVING CHILLS SINCE D/C HOME.  PAIN FROM PROCEDURE SITE AND RADIATES TO ABD REGION.   PAGED [name] AT 9:55AM- DR GY SPOKE W/ DAVE RINEHULS, PA AND HE WILL CONTACT PT AND MAY BE SEEN AT THE HOSPITAL.-10:12AM

## 2011-12-07 NOTE — Progress Notes (Signed)
Agree with above 

## 2011-12-08 LAB — URINE CULTURE
Culture  Setup Time: 201301030111
Culture: NO GROWTH

## 2011-12-17 ENCOUNTER — Encounter (HOSPITAL_COMMUNITY): Payer: Self-pay | Admitting: *Deleted

## 2011-12-17 ENCOUNTER — Emergency Department (HOSPITAL_COMMUNITY)
Admission: EM | Admit: 2011-12-17 | Discharge: 2011-12-17 | Disposition: A | Discharge status: Home / Self Care | Payer: Self-pay | Attending: Emergency Medicine | Admitting: Emergency Medicine

## 2011-12-17 ENCOUNTER — Emergency Department (HOSPITAL_COMMUNITY): Payer: Self-pay

## 2011-12-17 DIAGNOSIS — I10 Essential (primary) hypertension: Secondary | ICD-10-CM | POA: Insufficient documentation

## 2011-12-17 DIAGNOSIS — R109 Unspecified abdominal pain: Secondary | ICD-10-CM | POA: Insufficient documentation

## 2011-12-17 DIAGNOSIS — K5732 Diverticulitis of large intestine without perforation or abscess without bleeding: Secondary | ICD-10-CM | POA: Insufficient documentation

## 2011-12-17 DIAGNOSIS — Z85528 Personal history of other malignant neoplasm of kidney: Secondary | ICD-10-CM | POA: Insufficient documentation

## 2011-12-17 DIAGNOSIS — E119 Type 2 diabetes mellitus without complications: Secondary | ICD-10-CM | POA: Insufficient documentation

## 2011-12-17 DIAGNOSIS — Z794 Long term (current) use of insulin: Secondary | ICD-10-CM | POA: Insufficient documentation

## 2011-12-17 DIAGNOSIS — G8918 Other acute postprocedural pain: Secondary | ICD-10-CM

## 2011-12-17 DIAGNOSIS — K5792 Diverticulitis of intestine, part unspecified, without perforation or abscess without bleeding: Secondary | ICD-10-CM

## 2011-12-17 LAB — COMPREHENSIVE METABOLIC PANEL
ALT: 14 U/L (ref 0–53)
AST: 16 U/L (ref 0–37)
Albumin: 3.8 g/dL (ref 3.5–5.2)
Alkaline Phosphatase: 92 U/L (ref 39–117)
BUN: 13 mg/dL (ref 6–23)
Chloride: 100 mEq/L (ref 96–112)
Potassium: 3.7 mEq/L (ref 3.5–5.1)
Sodium: 137 mEq/L (ref 135–145)
Total Bilirubin: 0.8 mg/dL (ref 0.3–1.2)

## 2011-12-17 LAB — LIPASE, BLOOD: Lipase: 15 U/L (ref 11–59)

## 2011-12-17 LAB — DIFFERENTIAL
Basophils Relative: 0 % (ref 0–1)
Eosinophils Absolute: 0.1 10*3/uL (ref 0.0–0.7)
Neutrophils Relative %: 48 % (ref 43–77)

## 2011-12-17 LAB — CBC
MCH: 29.6 pg (ref 26.0–34.0)
MCHC: 35.3 g/dL (ref 30.0–36.0)
Platelets: 277 10*3/uL (ref 150–400)

## 2011-12-17 LAB — URINALYSIS, ROUTINE W REFLEX MICROSCOPIC
Ketones, ur: NEGATIVE mg/dL
Leukocytes, UA: NEGATIVE
Protein, ur: NEGATIVE mg/dL
Urobilinogen, UA: 1 mg/dL (ref 0.0–1.0)

## 2011-12-17 LAB — URINE CULTURE

## 2011-12-17 MED ORDER — OXYCODONE-ACETAMINOPHEN 5-325 MG PO TABS
2.0000 | ORAL_TABLET | Freq: Once | ORAL | Status: AC
  Administered 2011-12-17: 2 via ORAL
  Filled 2011-12-17: qty 2

## 2011-12-17 MED ORDER — SODIUM CHLORIDE 0.9 % IV SOLN
INTRAVENOUS | Status: DC
  Administered 2011-12-17: 16:00:00 via INTRAVENOUS

## 2011-12-17 MED ORDER — HYDROMORPHONE HCL PF 1 MG/ML IJ SOLN
1.0000 mg | Freq: Once | INTRAMUSCULAR | Status: AC
  Administered 2011-12-17: 1 mg via INTRAVENOUS
  Filled 2011-12-17: qty 1

## 2011-12-17 MED ORDER — IOHEXOL 300 MG/ML  SOLN
100.0000 mL | Freq: Once | INTRAMUSCULAR | Status: AC | PRN
  Administered 2011-12-17: 100 mL via INTRAVENOUS

## 2011-12-17 MED ORDER — METRONIDAZOLE 500 MG PO TABS: 500.0000 mg | ORAL_TABLET | Freq: Two times a day (BID) | ORAL | Status: AC

## 2011-12-17 MED ORDER — OXYCODONE-ACETAMINOPHEN 5-325 MG PO TABS: ORAL_TABLET | ORAL | Status: AC

## 2011-12-17 MED ORDER — CIPROFLOXACIN HCL 500 MG PO TABS
500.0000 mg | ORAL_TABLET | Freq: Once | ORAL | Status: AC
  Administered 2011-12-17: 500 mg via ORAL
  Filled 2011-12-17: qty 1

## 2011-12-17 MED ORDER — METRONIDAZOLE 500 MG PO TABS
500.0000 mg | ORAL_TABLET | Freq: Once | ORAL | Status: AC
  Administered 2011-12-17: 500 mg via ORAL
  Filled 2011-12-17: qty 1

## 2011-12-17 MED ORDER — CIPROFLOXACIN HCL 500 MG PO TABS: 500.0000 mg | ORAL_TABLET | Freq: Two times a day (BID) | ORAL | Status: AC

## 2011-12-17 NOTE — ED Notes (Signed)
Report given inman, rn

## 2011-12-17 NOTE — ED Notes (Signed)
Patient transported to CT 

## 2011-12-17 NOTE — ED Notes (Signed)
Pt alert and oriented x4. Respirations even and unlabored, bilateral symmetrical rise and fall of chest. Skin warm and dry. In no acute distress. Denies needs.   

## 2011-12-17 NOTE — ED Notes (Signed)
CT notified that pt was done with oral contrast.

## 2011-12-17 NOTE — ED Notes (Signed)
Pt returned from CT °

## 2011-12-17 NOTE — ED Notes (Signed)
Pt resting, introduced myself and wrote my name on board.  Pt states that he has pain but states that he just had pain medication.  Will follow up.  VSS

## 2011-12-17 NOTE — ED Notes (Signed)
Per pt report 2 weeks ago had crioablasion in left kidney for kidney tumor, pain has been continuous since then shooting/pounding 10/10. Pain radiates to whole left side. Reports nausea, denies vomiting, dysuria, frequency. Pt reports urinating at least once every 6 hours, urine color clear to dark brown.

## 2011-12-17 NOTE — ED Provider Notes (Signed)
History     CSN: [id number]  Arrival date & time 12/17/11  1354   Chief Complaint  Patient presents with  . Flank Pain    left side    HPI Pt was seen at 1510.  Per pt, c/o gradual onset and persistence of constant acute flair of his chronic left sided flank and low back "pain" for the past several days.  Has been assoc with nausea and radiation into the left side of his abd.  Pt endorses hx of this same pain since s/p left kidney surgery approx 2 weeks ago (12/02/11).  States his pain worsened after he ran out of his pain meds several days ago.     Rads:  Dr. Kathlene Cote Past Medical History  Diagnosis Date  . Hypertension   . Diabetes mellitus   . Bilateral renal masses   . Diverticulitis   . Kidney tumor     Past Surgical History  Procedure Date  . Kidney surgery     History  Substance Use Topics  . Smoking status: Never Smoker   . Smokeless tobacco: Never Used  . Alcohol Use: No     Review of Systems ROS: Statement: All systems negative except as marked or noted in the HPI; Constitutional: Negative for fever and chills. ; ; Eyes: Negative for eye pain, redness and discharge. ; ; ENMT: Negative for ear pain, hoarseness, nasal congestion, sinus pressure and sore throat. ; ; Cardiovascular: Negative for chest pain, palpitations, diaphoresis, dyspnea and peripheral edema. ; ; Respiratory: Negative for cough, wheezing and stridor. ; ; Gastrointestinal: +nausea.  Negative for vomiting, diarrhea, abdominal pain, blood in stool, hematemesis, jaundice and rectal bleeding. . ; ; Genitourinary: +left sided flank/back pain. Negative for dysuria, and hematuria. ; ; Musculoskeletal: Negative for neck pain. Negative for swelling and trauma.; ; Skin: Negative for pruritus, rash, abrasions, blisters, bruising and skin lesion.; ; Neuro: Negative for headache, lightheadedness and neck stiffness. Negative for weakness, altered level of consciousness , altered mental status, extremity weakness,  paresthesias, involuntary movement, seizure and syncope.     Allergies  Review of patient's allergies indicates no known allergies.  Home Medications   Current Outpatient Rx  Name Route Sig Dispense Refill  . HYDROMORPHONE HCL 4 MG PO TABS Oral Take 4 mg by mouth every 4 (four) hours as needed. For pain.    . INSULIN ISOPHANE & REGULAR (70-30) 100 UNIT/ML [place] SUSP Subcutaneous Inject 40 Units into the skin 2 (two) times daily with a meal.     . TELMISARTAN 80 MG PO TABS Oral Take 80 mg by mouth daily.      BP 177/98  Temp(Src) 98.7 F (37.1 C) (Oral)  Resp 16  SpO2 100%  Physical Exam 1515: Physical examination:  Nursing notes reviewed; Vital signs and O2 SAT reviewed;  Constitutional: Well developed, Well nourished, Well hydrated, In no acute distress; Head:  Normocephalic, atraumatic; Eyes: EOMI, PERRL, No scleral icterus; ENMT: Mouth and pharynx normal, Mucous membranes moist; Neck: Supple, Full range of motion, No lymphadenopathy; Cardiovascular: Regular rate and rhythm, No murmur, rub, or gallop; Respiratory: Breath sounds clear & equal bilaterally, No rales, rhonchi, wheezes, or rub, Normal respiratory effort/excursion; Chest: Nontender, Movement normal; Abdomen: Soft, +mild left sided abd tenderness to palp, No rebound or guarding.  Nondistended, Normal bowel sounds; Genitourinary: No CVA tenderness; Spine:  No midline CS, TS, LS tenderness. +TTP left lumbar paraspinal muscles.  No rash, no ecchymosis.; Extremities: Pulses normal, No tenderness, No edema, No calf  edema or asymmetry.; Neuro: AA&Ox3, Major CN grossly intact. Speech clear. No gross focal motor or sensory deficits in extremities.; Skin: Color normal, Warm, Dry, no rash.    ED Course  Procedures    MDM  MDM Reviewed: nursing note, vitals and previous chart Reviewed previous: CT scan Interpretation: CT scan and labs   Results for orders placed during the hospital encounter of 12/17/11  CBC      Component Value  Range   WBC 5.1  4.0 - 10.5 (K/uL)   RBC 4.59  4.22 - 5.81 (MIL/uL)   Hemoglobin 13.6  13.0 - 17.0 (g/dL)   HCT 38.5 (*) 39.0 - 52.0 (%)   MCV 83.9  78.0 - 100.0 (fL)   MCH 29.6  26.0 - 34.0 (pg)   MCHC 35.3  30.0 - 36.0 (g/dL)   RDW 11.5  11.5 - 15.5 (%)   Platelets 277  150 - 400 (K/uL)  DIFFERENTIAL      Component Value Range   Neutrophils Relative 48  43 - 77 (%)   Neutro Abs 2.4  1.7 - 7.7 (K/uL)   Lymphocytes Relative 42  12 - 46 (%)   Lymphs Abs 2.1  0.7 - 4.0 (K/uL)   Monocytes Relative 8  3 - 12 (%)   Monocytes Absolute 0.4  0.1 - 1.0 (K/uL)   Eosinophils Relative 3  0 - 5 (%)   Eosinophils Absolute 0.1  0.0 - 0.7 (K/uL)   Basophils Relative 0  0 - 1 (%)   Basophils Absolute 0.0  0.0 - 0.1 (K/uL)  URINALYSIS, ROUTINE W REFLEX MICROSCOPIC      Component Value Range   Color, Urine YELLOW  YELLOW    APPearance CLEAR  CLEAR    Specific Gravity, Urine 1.013  1.005 - 1.030    pH 7.0  5.0 - 8.0    Glucose, UA NEGATIVE  NEGATIVE (mg/dL)   Hgb urine dipstick MODERATE (*) NEGATIVE    Bilirubin Urine NEGATIVE  NEGATIVE    Ketones, ur NEGATIVE  NEGATIVE (mg/dL)   Protein, ur NEGATIVE  NEGATIVE (mg/dL)   Urobilinogen, UA 1.0  0.0 - 1.0 (mg/dL)   Nitrite NEGATIVE  NEGATIVE    Leukocytes, UA NEGATIVE  NEGATIVE   COMPREHENSIVE METABOLIC PANEL      Component Value Range   Sodium 137  135 - 145 (mEq/L)   Potassium 3.7  3.5 - 5.1 (mEq/L)   Chloride 100  96 - 112 (mEq/L)   CO2 28  19 - 32 (mEq/L)   Glucose, Bld 133 (*) 70 - 99 (mg/dL)   BUN 13  6 - 23 (mg/dL)   Creatinine, Ser 0.91  0.50 - 1.35 (mg/dL)   Calcium 9.4  8.4 - 10.5 (mg/dL)   Total Protein 8.1  6.0 - 8.3 (g/dL)   Albumin 3.8  3.5 - 5.2 (g/dL)   AST 16  0 - 37 (U/L)   ALT 14  0 - 53 (U/L)   Alkaline Phosphatase 92  39 - 117 (U/L)   Total Bilirubin 0.8  0.3 - 1.2 (mg/dL)   GFR calc non Af Amer >90  >90 (mL/min)   GFR calc Af Amer >90  >90 (mL/min)  LIPASE, BLOOD      Component Value Range   Lipase 15  11 - 59  (U/L)  URINE MICROSCOPIC-ADD ON      Component Value Range   RBC / HPF 0-2  <3 (RBC/hpf)    Ct Abdomen Pelvis W Contrast 12/17/2011  *  RADIOLOGY REPORT*  Clinical Data: Pain.  Renal ablation 2 weeks ago.  History of kidney cancer.  CT ABDOMEN AND PELVIS WITH CONTRAST  Technique:  Multidetector CT imaging of the abdomen and pelvis was performed following the standard protocol during bolus administration of intravenous contrast.  Contrast: 117mL OMNIPAQUE IOHEXOL 300 MG/ML IV SOLN  Comparison: Ablation CT of 12/02/2011.  Pre procedure CT abdomen 11/08/2011.  Most recent pelvic CT of 08/02/2011.  Findings: Pleural-based right middle lobe nodule is unchanged on image 2 at 7 mm.  A left lower lobe 4 mm nodule is similar on image 1.  Mild cardiomegaly without pericardial or pleural effusion.  Normal liver, spleen, stomach, pancreas, gallbladder, biliary tract, adrenal glands.  Interpolar right renal low density structure measures 3.2 cm on image 27 of series 2 versus 3.3 cm at the same level on the prior exam (when remeasured).  This is heterogeneously low in density, consistent with treated carcinoma.  At the site of the lower pole left renal ablation is  an area of absent enhancement which measures 3.5 x 4.5 cm on image 32 of series 2.  There is mild surrounding perirenal edema.  No perirenal fluid collection. There is mild ill-defined fluid along the left psoas muscle on image 34.  The left renal vein is patent.  Adjacent bowel loops are unremarkable.  No collecting system complication identified. No retroperitoneal or retrocrural adenopathy.  There is mild edema surrounding the sigmoid colon, including on image 59 of series 2.  This is the site of numerous diverticula. Undescended distal transverse colon. Normal terminal ileum and appendix.  Normal small bowel without abdominal ascites.    No pelvic adenopathy.    Normal urinary bladder and prostate.  No significant free fluid.  Age advanced degenerative change  of the bilateral hips.  Sacroiliac joints are nearly completely fused, presumably degenerative.  IMPRESSION:  1.  Mild uncomplicated sigmoid diverticulitis. 2.  Status post bilateral renal ablations.  Expected recent ablated changes surrounding the lower pole left kidney.  No acute complication identified. 3.  Bilateral lung base nodules remain indeterminate. Recommend attention on follow-up.  Original Report Authenticated By: Areta Haber, M.D.     7:59 PM:   Pt states he does not want to stay in the ED any longer and wants to be discharged now.  Offered admission for his diverticulitis and for pain control, but does not want to stay.  States to myself, ED RN and ED Tech that he "just needs some rx for some pain meds" and "what are you going to give me?"  States he "has enough abx at home," but when further questioned regarding what the abx were, how many pills he had, who rx abx, when they were rx and for what condition, etc; pt told me he "doesn't need any of that, I just need some pain meds."  Encouraged to stay if he was in this great of pain, but emphatically told me did not want to stay and climbed off the stretcher to get his clothes to put them on.  WBC normal, no UTI, afebrile.  Dx testing d/w pt.  Questions answered.  Verb understanding.  Again, does not want to stay, requesting d/c "right now."  Agrees to f/u with is PMD and Surgeon/Rads this week.                [place], DO 12/19/11 1533

## 2011-12-30 ENCOUNTER — Encounter (HOSPITAL_COMMUNITY): Payer: Self-pay

## 2011-12-30 ENCOUNTER — Inpatient Hospital Stay (HOSPITAL_COMMUNITY)
Admission: EM | Admit: 2011-12-30 | Discharge: 2012-01-01 | DRG: 316 | Disposition: A | Discharge status: Home / Self Care | Payer: BC Managed Care – PPO | Attending: Internal Medicine | Admitting: Internal Medicine

## 2011-12-30 ENCOUNTER — Inpatient Hospital Stay (HOSPITAL_COMMUNITY): Payer: BC Managed Care – PPO

## 2011-12-30 ENCOUNTER — Emergency Department (HOSPITAL_COMMUNITY): Payer: BC Managed Care – PPO

## 2011-12-30 DIAGNOSIS — I10 Essential (primary) hypertension: Secondary | ICD-10-CM | POA: Diagnosis present

## 2011-12-30 DIAGNOSIS — N179 Acute kidney failure, unspecified: Principal | ICD-10-CM

## 2011-12-30 DIAGNOSIS — I129 Hypertensive chronic kidney disease with stage 1 through stage 4 chronic kidney disease, or unspecified chronic kidney disease: Secondary | ICD-10-CM

## 2011-12-30 DIAGNOSIS — C649 Malignant neoplasm of unspecified kidney, except renal pelvis: Secondary | ICD-10-CM

## 2011-12-30 DIAGNOSIS — K5792 Diverticulitis of intestine, part unspecified, without perforation or abscess without bleeding: Secondary | ICD-10-CM

## 2011-12-30 DIAGNOSIS — E119 Type 2 diabetes mellitus without complications: Secondary | ICD-10-CM

## 2011-12-30 DIAGNOSIS — R109 Unspecified abdominal pain: Secondary | ICD-10-CM

## 2011-12-30 DIAGNOSIS — R1012 Left upper quadrant pain: Secondary | ICD-10-CM | POA: Diagnosis present

## 2011-12-30 DIAGNOSIS — Z8719 Personal history of other diseases of the digestive system: Secondary | ICD-10-CM

## 2011-12-30 DIAGNOSIS — Z794 Long term (current) use of insulin: Secondary | ICD-10-CM

## 2011-12-30 DIAGNOSIS — N289 Disorder of kidney and ureter, unspecified: Secondary | ICD-10-CM

## 2011-12-30 LAB — URINALYSIS, ROUTINE W REFLEX MICROSCOPIC
Bilirubin Urine: NEGATIVE
Glucose, UA: NEGATIVE mg/dL
Ketones, ur: NEGATIVE mg/dL
Protein, ur: NEGATIVE mg/dL
pH: 6 (ref 5.0–8.0)

## 2011-12-30 LAB — COMPREHENSIVE METABOLIC PANEL
AST: 22 U/L (ref 0–37)
CO2: 24 mEq/L (ref 19–32)
Calcium: 9.8 mg/dL (ref 8.4–10.5)
Chloride: 101 mEq/L (ref 96–112)
Creatinine, Ser: 2.28 mg/dL — ABNORMAL HIGH (ref 0.50–1.35)
GFR calc Af Amer: 37 mL/min — ABNORMAL LOW (ref >90)
GFR calc non Af Amer: 32 mL/min — ABNORMAL LOW (ref >90)
Glucose, Bld: 110 mg/dL — ABNORMAL HIGH (ref 70–99)
Total Bilirubin: 1.2 mg/dL (ref 0.3–1.2)

## 2011-12-30 LAB — CBC
HCT: 42.2 % (ref 39.0–52.0)
Hemoglobin: 15.1 g/dL (ref 13.0–17.0)
MCH: 30.2 pg (ref 26.0–34.0)
MCHC: 35.8 g/dL (ref 30.0–36.0)
MCV: 84.4 fL (ref 78.0–100.0)
RBC: 5 MIL/uL (ref 4.22–5.81)

## 2011-12-30 LAB — GLUCOSE, CAPILLARY: Glucose-Capillary: 98 mg/dL (ref 70–99)

## 2011-12-30 MED ORDER — AMLODIPINE BESYLATE 5 MG PO TABS
5.0000 mg | ORAL_TABLET | Freq: Every day | ORAL | Status: DC
  Administered 2011-12-30 – 2012-01-01 (×3): 5 mg via ORAL
  Filled 2011-12-30 (×3): qty 1

## 2011-12-30 MED ORDER — HYDROMORPHONE HCL PF 1 MG/ML IJ SOLN
0.5000 mg | INTRAMUSCULAR | Status: DC | PRN
  Administered 2011-12-31 (×3): 0.5 mg via INTRAVENOUS
  Filled 2011-12-30 (×5): qty 1

## 2011-12-30 MED ORDER — SODIUM CHLORIDE 0.9 % IV SOLN
999.0000 mL | INTRAVENOUS | Status: DC
  Administered 2011-12-30: 999 mL via INTRAVENOUS
  Administered 2011-12-30: 250 mL via INTRAVENOUS
  Administered 2012-01-01: 999 mL via INTRAVENOUS

## 2011-12-30 MED ORDER — HYDROMORPHONE HCL PF 1 MG/ML IJ SOLN
1.0000 mg | INTRAMUSCULAR | Status: AC | PRN
  Administered 2011-12-30 (×3): 1 mg via INTRAVENOUS
  Filled 2011-12-30 (×3): qty 1

## 2011-12-30 MED ORDER — ONDANSETRON HCL 4 MG/2ML IJ SOLN: 4.0000 mg | Freq: Three times a day (TID) | INTRAMUSCULAR | Status: AC | PRN

## 2011-12-30 MED ORDER — HEPARIN SODIUM (PORCINE) 5000 UNIT/ML IJ SOLN
5000.0000 [IU] | Freq: Three times a day (TID) | INTRAMUSCULAR | Status: DC
  Filled 2011-12-30 (×9): qty 1

## 2011-12-30 MED ORDER — SODIUM CHLORIDE 0.9 % IV SOLN: INTRAVENOUS | Status: AC

## 2011-12-30 MED ORDER — ACETAMINOPHEN 650 MG RE SUPP: 650.0000 mg | Freq: Four times a day (QID) | RECTAL | Status: DC | PRN

## 2011-12-30 MED ORDER — ONDANSETRON HCL 4 MG/2ML IJ SOLN
4.0000 mg | Freq: Once | INTRAMUSCULAR | Status: AC
  Administered 2011-12-30: 4 mg via INTRAVENOUS
  Filled 2011-12-30: qty 2

## 2011-12-30 MED ORDER — SODIUM CHLORIDE 0.9 % IV BOLUS (SEPSIS): 1000.0000 mL | Freq: Once | INTRAVENOUS | Status: DC

## 2011-12-30 MED ORDER — HYDROCODONE-ACETAMINOPHEN 5-325 MG PO TABS
1.0000 | ORAL_TABLET | ORAL | Status: DC | PRN
  Administered 2011-12-31 – 2012-01-01 (×7): 2 via ORAL
  Filled 2011-12-30 (×7): qty 2

## 2011-12-30 MED ORDER — INSULIN ASPART PROT & ASPART (70-30 MIX) 100 UNIT/ML ~~LOC~~ SUSP
40.0000 [IU] | Freq: Two times a day (BID) | SUBCUTANEOUS | Status: DC
Start: 2011-12-30 — End: 2012-01-01
  Administered 2011-12-30 – 2012-01-01 (×4): 40 [IU] via SUBCUTANEOUS
  Filled 2011-12-30 (×2): qty 3

## 2011-12-30 MED ORDER — MORPHINE SULFATE 4 MG/ML IJ SOLN
4.0000 mg | Freq: Once | INTRAMUSCULAR | Status: DC
  Filled 2011-12-30: qty 1

## 2011-12-30 MED ORDER — MORPHINE SULFATE 4 MG/ML IJ SOLN
4.0000 mg | Freq: Once | INTRAMUSCULAR | Status: AC
  Administered 2011-12-30: 4 mg via INTRAVENOUS
  Filled 2011-12-30: qty 1

## 2011-12-30 MED ORDER — INSULIN NPH ISOPHANE & REGULAR (70-30) 100 UNIT/ML ~~LOC~~ SUSP: 40.0000 [IU] | Freq: Two times a day (BID) | SUBCUTANEOUS | Status: DC

## 2011-12-30 MED ORDER — ACETAMINOPHEN 325 MG PO TABS: 650.0000 mg | ORAL_TABLET | Freq: Four times a day (QID) | ORAL | Status: DC | PRN

## 2011-12-30 NOTE — ED Provider Notes (Signed)
History     CSN: [id number]  Arrival date & time 12/30/11  [id number]   First MD Initiated Contact with Patient 12/30/11 [phone]      Chief Complaint  Patient presents with  . Abdominal Pain  . Back Pain    (Consider location/radiation/quality/duration/timing/severity/associated sxs/prior treatment) Patient is a 51 y.o. male presenting with abdominal pain and back pain.  Abdominal Pain The primary symptoms of the illness include abdominal pain and nausea. The primary symptoms of the illness do not include fever, shortness of breath, vomiting, diarrhea or dysuria.  Additional symptoms associated with the illness include back pain. Symptoms associated with the illness do not include hematuria.  Back Pain  Associated symptoms include abdominal pain. Pertinent negatives include no chest pain, no fever, no headaches and no dysuria.   the patient is a 51 year old male, with a history of hypertension, diabetes, diverticulitis, and kidney cancer, who presents to the emergency department complaining of epigastric abdominal pain for several days.  This is accompanied by nausea.  He denies vomiting, diarrhea, fevers, chills, cough, shortness of breath, or chest pain.  He denies hematuria.  He states his pain medications work temporarily, but the pain persists after the medicine wears off.  He denies a history of abdominal surgery.  He does not drink alcohol.  He denies a history of peptic ulcer disease.  Past Medical History  Diagnosis Date  . Hypertension   . Diabetes mellitus   . Bilateral renal masses   . Diverticulitis   . Kidney tumor     Past Surgical History  Procedure Date  . Kidney surgery     Family History  Problem Relation Age of Onset  . Diabetes Father     History  Substance Use Topics  . Smoking status: Never Smoker   . Smokeless tobacco: Never Used  . Alcohol Use: No      Review of Systems  Constitutional: Negative for fever.  Respiratory: Negative for cough and  shortness of breath.   Cardiovascular: Negative for chest pain.  Gastrointestinal: Positive for nausea and abdominal pain. Negative for vomiting and diarrhea.  Genitourinary: Negative for dysuria and hematuria.  Musculoskeletal: Positive for back pain.  Neurological: Negative for headaches.  All other systems reviewed and are negative.    Allergies  Review of patient's allergies indicates no known allergies.  Home Medications   Current Outpatient Rx  Name Route Sig Dispense Refill  . HYDROMORPHONE HCL 4 MG PO TABS Oral Take 4 mg by mouth every 4 (four) hours as needed. For pain.    . INSULIN ISOPHANE & REGULAR (70-30) 100 UNIT/ML [place] SUSP Subcutaneous Inject 40 Units into the skin 2 (two) times daily with a meal.     . TELMISARTAN 80 MG PO TABS Oral Take 80 mg by mouth daily.      BP 150/98  Pulse 102  Temp(Src) 97.4 F (36.3 C) (Oral)  Resp 16  SpO2 100%  Physical Exam  Vitals reviewed. Constitutional: He is oriented to person, place, and time. He appears well-developed and well-nourished. No distress.  HENT:  Head: Normocephalic and atraumatic.  Eyes: EOM are normal. Pupils are equal, round, and reactive to light.  Neck: Normal range of motion. Neck supple.  Cardiovascular: Normal rate, regular rhythm and normal heart sounds.   No murmur heard. Pulmonary/Chest: Effort normal and breath sounds normal. No respiratory distress. He has no wheezes. He has no rales.  Abdominal: Soft. Bowel sounds are normal. He exhibits no distension and no  mass. There is tenderness. There is no rebound and no guarding.       Epigastric abdominal tenderness without peritoneal signs  Musculoskeletal: Normal range of motion. He exhibits no edema and no tenderness.  Neurological: He is alert and oriented to person, place, and time. No cranial nerve deficit.  Skin: Skin is warm and dry. He is not diaphoretic.  Psychiatric: He has a normal mood and affect. His behavior is normal.    ED Course    Procedures (including critical care time) 51 year old male, with a history of diverticulitis, and renal cancer.  She presents to the emergency department with abdominal pain, and nausea for several days.  He does not have an acute abdomen.  We will establish an IV and treat him with analgesics and antiemetics.  I will perform laboratory testing, and a CAT scan of his abdomen for further evaluation.   Labs Reviewed  CBC  URINALYSIS, ROUTINE W REFLEX MICROSCOPIC  COMPREHENSIVE METABOLIC PANEL  LIPASE, BLOOD   No results found.   No diagnosis found.  11:28 AM sxs resolved   12:52 PM Spoke with [facility].   They will admit for new renal insufficiency.   One should note that prior to the CT with contrast, pt did not have known renal insufficiency.  The contrast load did not cause this but certainly did not help.    MDM  Abdominal pain- no acute abdomen.  No signs of diverticulitis.  At this time.  No evidence of urinary tract infection, or ureteral stones. Renal insufficiency - significant decreased fx since 12/17/11 when cr. Was 0.91.  Will admit for ivf and further tx.         Elmer Picker, MD 12/30/11 1254

## 2011-12-30 NOTE — ED Notes (Signed)
Patient reports that he was seen in the ED 2 weeks ago for abdominal pain that radiates to his back. Patient reports that he has had tumors removed from his kidney and has a history of diverticulitis. Patient states that he last took pain medicine 2 days ago, but is now out of medicine. Patient states that the pain is worse.

## 2011-12-30 NOTE — ED Notes (Signed)
Report to [facility] pt awaiting transport

## 2011-12-30 NOTE — H&P (Signed)
PCP:   [facility]   Subspecialists involved  Chief Complaint:  51 yr old male with known h/o diverticulitis status post microperforation, renal cell carcinoma that is post ablation 10/12 and 12/28, moderately controlled diabetes mellitus on insulin with a past history of DKA, hypertension was seen recently at the Emergency room, and treated for a possible diverticulitis flare on 1/12 at the emergency room at [facility].  Was given Cipro and Metronidazole, was given about 2 weeks of it.  Claims he finished the course of medications.   A new medication did not improve his pain and he completed actually course medication that he had at home prior to this and then completed a 14 day course of medication despite some nausea but still had abdominal pain. Patient subsequently went this past Sunday, January 20 to [facility] emergency room had another CT scan because of intractable abdominal pain  Hasn't been able to eat and drink for about 1-2 days.  No diarrhoea, but has had abdominal pain in the lower abdomen in the rib cage and right below this.  Pain has concentrated on where he had recent surgery for removal of tumours of on the kidney (last time 12/28) , mainly over the Left kidney.  The pain is constant and not relieved by hydrocodone oxycodone a few seconds lungs rib cage is when he turns onto his left side he has increasing amounts of pain.  He has had 5-6 CT scans of the abdomen in the past couple of months for this pain but have not been able to figure out where it's coming from  Chem profile showed a BUN to creatinine ratio thirteen over 2.28, with no corresponding increase in his BUN.  His total protein on doing LFTs showed protein of 2.8.  Hemoglobin 15.1 CT abdomen done 12/30/2011 showed no urinary stones and the patient affects in both kidneys not well characterized unsteady without intravenous contrast. Interval improvement of the information sigmoid colon stable appearance of bilateral pulmonary  nodules and lung base  Review of Systems:  No fever, but chills are present.    Still passing urine.  Passing stool.  No blood in stool.  No cp or sob-had some tightness in the chest yesterday though.  Is diabetic and have been taking meds.  Has been checking sugars at home and claims that they have been 90-130's now  Past Medical History: Past Medical History  Diagnosis Date  . Hypertension   . Diabetes mellitus     DKA sept admission  . Diverticulitis     s/p micorperforation Sept 2012-managed conservatively by Gen surgery  . Kidney tumor     Renal cell CA    Past surgical history: Past Surgical History  Procedure Date  . Kidney surgery     last one 12/28, prior one was October 2012-Dr. Kathlene Cote did the surgery.    Medications: Prior to Admission medications   Medication Sig Start Date End Date Taking? Authorizing Provider  insulin NPH-insulin regular (NOVOLIN 70/30) (70-30) 100 UNIT/ML injection Inject 40 Units into the skin 2 (two) times daily with a meal.    Yes Historical Provider, MD  telmisartan (MICARDIS) 80 MG tablet Take 80 mg by mouth daily.   Yes Historical Provider, MD    Allergies:  No Known Allergies  Social History:  reports that he has never smoked. He has never used smokeless tobacco. He reports that he does not drink alcohol or use illicit drugs.  Family History: Family History  Problem Relation Age of Onset  . Diabetes Father     Physical Exam: Filed Vitals:   12/30/11 0740 12/30/11 0742 12/30/11 1301  BP: 150/98  142/95  Pulse: 102  99  Temp: 97.4 F (36.3 C)  98.7 F (37.1 C)  TempSrc: Oral  Oral  Resp: 16  20  SpO2: 100% 100% 100%    HEENT-Alert pleasant AAM, in some pain.  No pallor/ict.  Mild JVd noted at bedside.  Throat clear.  mallampati 3 CHEST-CTA b. No added sound.  No tvr/tvf.  Trachea central CARDS-tachycardic s1 s2 no m/r/g ABD-Tender over the L CVA area.  Mild LLQ pain, no rebound or gaurding.  BS  dimished SKIN-nad NEURO-grossly cn 2-12 intact  Labs on Admission:   [facility] 12/30/11 0838  NA 139  K 3.5  CL 101  CO2 24  GLUCOSE 110*  BUN 13  CREATININE 2.28*  CALCIUM 9.8  MG --  PHOS --    Basename 12/30/11 0838  AST 22  ALT 16  ALKPHOS 90  BILITOT 1.2  PROT 8.6*  ALBUMIN 4.3    Basename 12/30/11 0838  LIPASE 18  AMYLASE --    Basename 12/30/11 0838  WBC 6.9  NEUTROABS --  HGB 15.1  HCT 42.2  MCV 84.4  PLT 230   No results found for this basename: CKTOTAL:3,CKMB:3,CKMBINDEX:3,TROPONINI:3 in the last 72 hours No results found for this basename: TSH,T4TOTAL,FREET3,T3FREE,THYROIDAB in the last 72 hours No results found for this basename: VITAMINB12:2,FOLATE:2,FERRITIN:2,TIBC:2,IRON:2,RETICCTPCT:2 in the last 72 hours  Radiological Exams on Admission: Ct Abdomen Pelvis Wo Contrast  12/30/2011  *RADIOLOGY REPORT*  Clinical Data: Left upper quadrant pain radiates to the back. Nausea.  The patient is status post bilateral renal ablation for neoplasm  CT ABDOMEN AND PELVIS WITHOUT CONTRAST  Technique:  Multidetector CT imaging of the abdomen and pelvis was performed following the standard protocol without intravenous contrast.  Comparison: 12/17/2011  Findings:  Tiny left lower lobe pulmonary nodule is stable.  7 mm right middle lobe pulmonary nodule is also unchanged.  No focal abnormalities seen in the liver or spleen on this study performed without intravenous contrast material.  The stomach, duodenum, pancreas, gallbladder, and adrenal glands are unremarkable.  The kidneys are not well evaluated without intravenous contrast material.  Peripheral lesion in the interpolar region of the right kidney corresponds to the previously characterized ablation defect. This measures about 2.8 x 2.3 cm today compared to 3.2 cm previously.  The ablation defect seen previously in the posterior left kidney is not well seen on the study without intravenous contrast material.  There is  bilateral perinephric edema/stranding.  No hydronephrosis. No renal stones.  No evidence for hydroureter on either side.  No ureteral or bladder stones.  No abdominal aortic aneurysm.  No free fluid or lymphadenopathy in the abdomen.  Imaging through the pelvis shows no free intraperitoneal fluid.  No pelvic sidewall lymphadenopathy.  There is some subtle edema/inflammation associated with the proximal sigmoid colon, the region of diverticulitis characterized on the prior study.  This secondary change has improved in the interval.  The terminal ileum is normal.  The appendix is normal.  Bone windows reveal no worrisome lytic or sclerotic osseous lesions.  IMPRESSION: No urinary stones.  Ablation defects in both kidneys are not well characterized on this study performed without intravenous contrast material.  If there is concern for renal infection, repeat imaging after IV contrast administration is recommended.  Interval improvement and edema/inflammation associate with the sigmoid  colon.  No evidence for perforation or abscess at this time.  Stable appearance of the bilateral pulmonary nodules seen in the lung bases.  Original Report Authenticated By: ERIC A. MANSELL, M.D.   Ct Abdomen Pelvis W Contrast  12/17/2011  *RADIOLOGY REPORT*  Clinical Data: Pain.  Renal ablation 2 weeks ago.  History of kidney cancer.  CT ABDOMEN AND PELVIS WITH CONTRAST  Technique:  Multidetector CT imaging of the abdomen and pelvis was performed following the standard protocol during bolus administration of intravenous contrast.  Contrast: 140mL OMNIPAQUE IOHEXOL 300 MG/ML IV SOLN  Comparison: Ablation CT of 12/02/2011.  Pre procedure CT abdomen 11/08/2011.  Most recent pelvic CT of 08/02/2011.  Findings: Pleural-based right middle lobe nodule is unchanged on image 2 at 7 mm.  A left lower lobe 4 mm nodule is similar on image 1.  Mild cardiomegaly without pericardial or pleural effusion.  Normal liver, spleen, stomach, pancreas,  gallbladder, biliary tract, adrenal glands.  Interpolar right renal low density structure measures 3.2 cm on image 27 of series 2 versus 3.3 cm at the same level on the prior exam (when remeasured).  This is heterogeneously low in density, consistent with treated carcinoma.  At the site of the lower pole left renal ablation is  an area of absent enhancement which measures 3.5 x 4.5 cm on image 32 of series 2.  There is mild surrounding perirenal edema.  No perirenal fluid collection. There is mild ill-defined fluid along the left psoas muscle on image 34.  The left renal vein is patent.  Adjacent bowel loops are unremarkable.  No collecting system complication identified. No retroperitoneal or retrocrural adenopathy.  There is mild edema surrounding the sigmoid colon, including on image 59 of series 2.  This is the site of numerous diverticula. Undescended distal transverse colon. Normal terminal ileum and appendix.  Normal small bowel without abdominal ascites.    No pelvic adenopathy.    Normal urinary bladder and prostate.  No significant free fluid.  Age advanced degenerative change of the bilateral hips.  Sacroiliac joints are nearly completely fused, presumably degenerative.  IMPRESSION:  1.  Mild uncomplicated sigmoid diverticulitis. 2.  Status post bilateral renal ablations.  Expected recent ablated changes surrounding the lower pole left kidney.  No acute complication identified. 3.  Bilateral lung base nodules remain indeterminate. Recommend attention on follow-up.  Original Report Authenticated By: Areta Haber, M.D.   Ct Guide Tissue Ablation  12/02/2011  *RADIOLOGY REPORT*  Clinical Data:  Status post percutaneous cryoablation of biopsy proven right renal cell carcinoma on 09/30/2011.  The patient also has a tumor of the left kidney and now presents for biopsy and ablation.  CT-GUIDED CORE BIOPSY OF LEFT RENAL TUMOR. CT-GUIDED PERCUTANEOUS CRYOABLATION OF LEFT RENAL TUMOR.  Anesthesia:  General   Medications:  2 grams IV Ancef. As antibiotic prophylaxis, Ancef 2 gm was ordered pre-procedure and administered intravenously within one hour of incision.  Contrast:  75 ml Omnipaque-300 IV  Procedure:  The procedure, risks, benefits, and alternatives were explained to the patient.  Questions regarding the procedure were encouraged and answered.  The patient understands and consents to the procedure.  The patient was placed under general anesthesia.  Initial unenhanced CT was performed in a prone position to localize the left renal tumor.  The left translumbar region was prepped with Betadine in a sterile fashion, and a sterile drape was applied covering the operative field.  A sterile gown and sterile gloves were used  for the procedure.  IV contrast was administered to better localize margins of the tumor.  A 17 gauge trocar needle was advanced into the left renal tumor.  Core biopsy was performed with an 18 gauge automated core biopsy device.  A total of 2 core samples were submitted in formalin for pathologic analysis.  Under CT guidance, a total of four Galil Ice Rod Plus percutaneous cryoablation probes were advanced into the left renal tumor.  Probe positioning was confirmed by CT prior to cryoablation.  Cryoablation was performed through the four probes simultaneously. Initial 10 minute cycle of cryoablation was performed.  This was followed by a 8 minute thaw cycle.  A second 10 minute cycle of cryoablation was then performed.  During ablation, periodic CT imaging was performed to monitor ice ball formation and morphology. After active thaw, the cryoablation probes were removed.  Post-procedural CT was performed.  Complications: None  Findings:  Initial unenhanced CT was performed.  The posterior left renal tumor originating from the mid to lower kidney is poorly demarcated by unenhanced CT due to its endophytic nature.  IV contrast was therefore utilized which outlines the tumor well. Maximal tumor  diameter is approximately 2.8 x 3.5 cm.  Based on tumor size and shape, it was elected to place four probes into the tumor in roughly a rectangular array.  Prior to ablation, core biopsy revealed solid tissue.  During cryoablation, CT was performed demonstrating adequate ice ball formation encompassing the tumor.  IMPRESSION:  CT guided percutaneous core biopsy and cryoablation of left renal tumor.  The patient will be observed overnight.  Initial follow-up will be performed in approximately 4 weeks.  Original Report Authenticated By: Azzie Roup, M.D.   Ct Biopsy  12/02/2011  *RADIOLOGY REPORT*  Clinical Data:  Status post percutaneous cryoablation of biopsy proven right renal cell carcinoma on 09/30/2011.  The patient also has a tumor of the left kidney and now presents for biopsy and ablation.  CT-GUIDED CORE BIOPSY OF LEFT RENAL TUMOR. CT-GUIDED PERCUTANEOUS CRYOABLATION OF LEFT RENAL TUMOR.  Anesthesia:  General  Medications:  2 grams IV Ancef. As antibiotic prophylaxis, Ancef 2 gm was ordered pre-procedure and administered intravenously within one hour of incision.  Contrast:  75 ml Omnipaque-300 IV  Procedure:  The procedure, risks, benefits, and alternatives were explained to the patient.  Questions regarding the procedure were encouraged and answered.  The patient understands and consents to the procedure.  The patient was placed under general anesthesia.  Initial unenhanced CT was performed in a prone position to localize the left renal tumor.  The left translumbar region was prepped with Betadine in a sterile fashion, and a sterile drape was applied covering the operative field.  A sterile gown and sterile gloves were used for the procedure.  IV contrast was administered to better localize margins of the tumor.  A 17 gauge trocar needle was advanced into the left renal tumor.  Core biopsy was performed with an 18 gauge automated core biopsy device.  A total of 2 core samples were submitted in  formalin for pathologic analysis.  Under CT guidance, a total of four Galil Ice Rod Plus percutaneous cryoablation probes were advanced into the left renal tumor.  Probe positioning was confirmed by CT prior to cryoablation.  Cryoablation was performed through the four probes simultaneously. Initial 10 minute cycle of cryoablation was performed.  This was followed by a 8 minute thaw cycle.  A second 10 minute cycle of cryoablation was  then performed.  During ablation, periodic CT imaging was performed to monitor ice ball formation and morphology. After active thaw, the cryoablation probes were removed.  Post-procedural CT was performed.  Complications: None  Findings:  Initial unenhanced CT was performed.  The posterior left renal tumor originating from the mid to lower kidney is poorly demarcated by unenhanced CT due to its endophytic nature.  IV contrast was therefore utilized which outlines the tumor well. Maximal tumor diameter is approximately 2.8 x 3.5 cm.  Based on tumor size and shape, it was elected to place four probes into the tumor in roughly a rectangular array.  Prior to ablation, core biopsy revealed solid tissue.  During cryoablation, CT was performed demonstrating adequate ice ball formation encompassing the tumor.  IMPRESSION:  CT guided percutaneous core biopsy and cryoablation of left renal tumor.  The patient will be observed overnight.  Initial follow-up will be performed in approximately 4 weeks.  Original Report Authenticated By: Azzie Roup, M.D.    Assessment/Plan Patient Active Problem List  Diagnoses  . AKI (acute kidney injury)-we will aggressively hydrate him with IV fluids at 150 cc per hour. He does not appear to have dehydration or volume depletion based on his beer and creatinine ration and hence because of his elevated creatinine in isolation I will get a renal ultrasound as he may have some renal parenchymal disorder associated with his history of renal cell carcinoma  going on. Will alert also interventional radiology regarding his care as he was supposed to have an appointment Dr. Kathlene Cote  to coordinate his renal cell carcinoma care-it appears that he has had this pain since 12/07/2011, and presented to be seen for this in the hospital   . Renal cell cancer-see above   . Diverticulitis-history of-has recently completed a prior dose of antibiotics with what appears to Cipro and Flagyl   . DM-with prior h/o DKA-we will continue his home dosage of 44 units 7030 insulin and keep him on regular diet   . HTN (hypertension)-moderately well controlled patient will hold on his own losartan given the fact that he may have some invasion of his renal arteries with this cancer that he has and this may be causing the acute elevation of creatinine.  We'll place him on Norvasc for the time he   . Abdominal pain-see above    About one hour spent admitting this patient   [facility] 12/30/2011, 1:56 PM

## 2011-12-30 NOTE — ED Notes (Signed)
Pt presenting to ed with c/o abdominal pain that's radiating into his back pt states he was seen here x 3 weeks ago for the same and the pain has never gone away. Pt states some nausea no vomiting at this time. Pt is alert and oriented at this time. Pt states pain is 10/10. Pt oob ambulating to restroom without difficulty

## 2011-12-31 LAB — COMPREHENSIVE METABOLIC PANEL
Alkaline Phosphatase: 65 U/L (ref 39–117)
BUN: 16 mg/dL (ref 6–23)
GFR calc Af Amer: 41 mL/min — ABNORMAL LOW (ref >90)
GFR calc non Af Amer: 36 mL/min — ABNORMAL LOW (ref >90)
Glucose, Bld: 124 mg/dL — ABNORMAL HIGH (ref 70–99)
Potassium: 3.7 mEq/L (ref 3.5–5.1)
Total Bilirubin: 0.6 mg/dL (ref 0.3–1.2)
Total Protein: 6.8 g/dL (ref 6.0–8.3)

## 2011-12-31 LAB — CBC
MCH: 29.6 pg (ref 26.0–34.0)
MCHC: 34.8 g/dL (ref 30.0–36.0)
Platelets: 224 10*3/uL (ref 150–400)
RBC: 4.29 MIL/uL (ref 4.22–5.81)
RDW: 12.4 % (ref 11.5–15.5)

## 2011-12-31 LAB — PROTIME-INR: Prothrombin Time: 17.6 seconds — ABNORMAL HIGH (ref 11.6–15.2)

## 2011-12-31 MED ORDER — GABAPENTIN 100 MG PO CAPS
200.0000 mg | ORAL_CAPSULE | Freq: Three times a day (TID) | ORAL | Status: DC
  Administered 2011-12-31 – 2012-01-01 (×3): 200 mg via ORAL
  Filled 2011-12-31 (×5): qty 2

## 2011-12-31 MED ORDER — HYDROMORPHONE HCL PF 1 MG/ML IJ SOLN
1.0000 mg | INTRAMUSCULAR | Status: DC | PRN
  Administered 2011-12-31 – 2012-01-01 (×6): 1 mg via INTRAVENOUS
  Filled 2011-12-31 (×6): qty 1

## 2011-12-31 MED ORDER — HYDROMORPHONE HCL PF 1 MG/ML IJ SOLN
0.5000 mg | Freq: Once | INTRAMUSCULAR | Status: AC
  Administered 2011-12-31: 0.5 mg via INTRAVENOUS

## 2011-12-31 NOTE — Progress Notes (Signed)
Pt asleep in room. Did not awaken with staff walking around in the room.

## 2011-12-31 NOTE — Progress Notes (Signed)
Spoke with Dr. Dillard Essex at length re pts behavior and c/o pain.  New order to increase Dilaudid per pt request to control pain.  States to give first dose now.

## 2011-12-31 NOTE — Progress Notes (Signed)
Subjective: Patient still having episodes of crampy, sometimes burning ? Neuropathic pain extending from left flank to LUQ region; denies nausea,vomiting, hematuria,dysuria, diarrhea, significant dyspnea or lower extremity radiculopathy.  Will occasionally have splinting lower left chest/diaphragmatic discomfort with deep inspiration. Minimal po intake last several days secondary to pain. Patient states this was exacerbated after recent left renal cell ca ablation.  Objective: Vital signs in last 24 hours: Temp:  [97.6 F (36.4 C)-98.8 F (37.1 C)] 97.9 F (36.6 C) (01/26 0544) Pulse Rate:  [80-99] 80  (01/26 0544) Resp:  [16-20] 18  (01/26 0544) BP: (120-177)/(75-113) 120/81 mmHg (01/26 0544) SpO2:  [97 %-100 %] 97 % (01/26 0544) Weight:  [240 lb (108.863 kg)] 240 lb (108.863 kg) (01/25 1651) Last BM Date: 12/30/11  Intake/Output from previous day: 01/25 0701 - 01/26 0700 In: 1500 [I.V.:1500] Out: -  Intake/Output this shift:    Patient awake ,alert; chest -CTA bilat., heart RRR, abd.- soft, positive BS, tender LUQ to left flank region, puncture site left flank clean and dry, no hematoma  Lab Results:   [facility] 12/31/11 0559 12/30/11 0838  WBC 5.2 6.9  HGB 12.7* 15.1  HCT 36.5* 42.2  PLT 224 230   BMET  Basename 12/31/11 0559 12/30/11 0838  NA 142 139  K 3.7 3.5  CL 107 101  CO2 27 24  GLUCOSE 124* 110*  BUN 16 13  CREATININE 2.09* 2.28*  CALCIUM 8.8 9.8   PT/INR  Basename 12/31/11 0559  LABPROT 17.6*  INR 1.42   ABG No results found for this basename: PHART:2,PCO2:2,PO2:2,HCO3:2 in the last 72 hours  Studies/Results: Ct Abdomen Pelvis Wo Contrast  12/30/2011  *RADIOLOGY REPORT*  Clinical Data: Left upper quadrant pain radiates to the back. Nausea.  The patient is status post bilateral renal ablation for neoplasm  CT ABDOMEN AND PELVIS WITHOUT CONTRAST  Technique:  Multidetector CT imaging of the abdomen and pelvis was performed following the standard  protocol without intravenous contrast.  Comparison: 12/17/2011  Findings:  Tiny left lower lobe pulmonary nodule is stable.  7 mm right middle lobe pulmonary nodule is also unchanged.  No focal abnormalities seen in the liver or spleen on this study performed without intravenous contrast material.  The stomach, duodenum, pancreas, gallbladder, and adrenal glands are unremarkable.  The kidneys are not well evaluated without intravenous contrast material.  Peripheral lesion in the interpolar region of the right kidney corresponds to the previously characterized ablation defect. This measures about 2.8 x 2.3 cm today compared to 3.2 cm previously.  The ablation defect seen previously in the posterior left kidney is not well seen on the study without intravenous contrast material.  There is bilateral perinephric edema/stranding.  No hydronephrosis. No renal stones.  No evidence for hydroureter on either side.  No ureteral or bladder stones.  No abdominal aortic aneurysm.  No free fluid or lymphadenopathy in the abdomen.  Imaging through the pelvis shows no free intraperitoneal fluid.  No pelvic sidewall lymphadenopathy.  There is some subtle edema/inflammation associated with the proximal sigmoid colon, the region of diverticulitis characterized on the prior study.  This secondary change has improved in the interval.  The terminal ileum is normal.  The appendix is normal.  Bone windows reveal no worrisome lytic or sclerotic osseous lesions.  IMPRESSION: No urinary stones.  Ablation defects in both kidneys are not well characterized on this study performed without intravenous contrast material.  If there is concern for renal infection, repeat imaging after IV contrast administration  is recommended.  Interval improvement and edema/inflammation associate with the sigmoid colon.  No evidence for perforation or abscess at this time.  Stable appearance of the bilateral pulmonary nodules seen in the lung bases.  Original  Report Authenticated By: ERIC A. MANSELL, M.D.   US Abdomen Complete  12/30/2011  *RADIOLOGY REPORT*  Clinical Data: Left-sided abdominal and back pain.  Status post bilateral renal ablations for renal neoplasm.  RENAL/URINARY TRACT ULTRASOUND COMPLETE  Comparison:  None.  Findings:  Right Kidney:  Measures 12.6 cm in length.  No evidence of hydronephrosis.  A mildly hyperechoic subcapsular mass is seen in the mid pole measuring 3.5 cm in maximum diameter.  Left Kidney:  Measures 11.8 cm length.  No evidence of hydronephrosis.  A mildly hyperechoic solid mass is seen in the lower pole of the left kidney measuring 5 cm in maximum diameter.  Bladder:  Unremarkable in appearance for degree of bladder distention.  IMPRESSION:  1.  Bilateral renal masses measuring 3.5 cm on the right and 5.0 cm on the left, consistent with the patient's known history of bilateral renal neoplasm. 2.  No evidence of hydronephrosis.  Original Report Authenticated By: Marlaine Hind, M.D.    Anti-infectives: Anti-infectives    None      Assessment/Plan: s/p bilateral renal cell ca cryoablations (right- 09/29/12; left - 12/01/12), now with renal insufficiency and ? Neuropathic pain secondary to recent left upper pole renal cell ca cryoablation.Recent imaging studies show no acute complications. Case d/w Dr. Kathlene Cote . Patient may benefit from trial of neurontin.[name] Recommend continued IV hydration and close monitoring of renal function. Creatinine currently improving, but if increase noted would get nephrology consult. Patient is scheduled for OP f/u with Dr. Kathlene Cote in February. Will monitor.   LOS: 1 day    ALLRED,D [facility] 12/31/2011

## 2011-12-31 NOTE — Progress Notes (Signed)
No further outbursts of anger this shift.  Each time I am in the room, very minimal conversation, pt withdrawn and very matter-of-fact tone of voice.  No further requests for pain med at this time.  Pt is ambulating in the room , requesting towels and washcloths to clean up with.  S.O remains at bedside.  No ADN.

## 2011-12-31 NOTE — Progress Notes (Signed)
Entered pt room to re eval pain med, pt resting with eyes closed on bench on right side.  When asked to rate his pain, pt mumbled , when asked to repeat, he sharply stated "THE SAME".  Dr. Dillard Essex paged x2.

## 2011-12-31 NOTE — Progress Notes (Signed)
P.A. Notified of pt's c/o pain this am, but was resting peacefully with eyes closed, face relaxed when in room earlier.

## 2011-12-31 NOTE — Progress Notes (Signed)
Subjective: States still with increased L.sided pain, going around his l. Lower ribcage /diaphragm area Objective: Vital signs in last 24 hours: Temp:  [97.5 F (36.4 C)-98.3 F (36.8 C)] 97.5 F (36.4 C) (01/26 2212) Pulse Rate:  [80-85] 85  (01/26 2212) Resp:  [18] 18  (01/26 2212) BP: (120-156)/(81-102) 156/97 mmHg (01/26 2212) SpO2:  [96 %-100 %] 100 % (01/26 2212) Last BM Date: 12/30/11 Intake/Output from previous day: 01/25 0701 - 01/26 0700 In: 1500 [I.V.:1500] Out: -  Intake/Output this shift:      General Appearance:    Alert, cooperative, no distress, obese  Lungs:     Clear to auscultation bilaterally, respirations unlabored   Heart:    Regular rate and rhythm, S1 and S2 normal, no murmur, rub   or gallop  Abdomen:     Soft , LUQ tenderness, also tender inl.flank area, no rebound , bowel sounds active all four quadrants,    no masses, no organomegaly  Extremities:   Extremities normal, atraumatic, no cyanosis or edema  Neurologic:   CNII-XII intact, normal strength, nonfocal       Weight change:   Intake/Output Summary (Last 24 hours) at 12/31/11 2357 Last data filed at 12/31/11 1900  Gross per 24 hour  Intake   1840 ml  Output      0 ml  Net   1840 ml    Lab Results:   Basename 12/31/11 0559 12/30/11 0838  NA 142 139  K 3.7 3.5  CL 107 101  CO2 27 24  GLUCOSE 124* 110*  BUN 16 13  CREATININE 2.09* 2.28*  CALCIUM 8.8 9.8    Basename 12/31/11 0559 12/30/11 0838  WBC 5.2 6.9  HGB 12.7* 15.1  HCT 36.5* 42.2  PLT 224 230  MCV 85.1 84.4   PT/INR  Basename 12/31/11 0559  LABPROT 17.6*  INR 1.42   ABG No results found for this basename: PHART:2,PCO2:2,PO2:2,HCO3:2 in the last 72 hours  Micro Results: No results found for this or any previous visit (from the past 240 hour(s)). Studies/Results: Ct Abdomen Pelvis Wo Contrast  12/30/2011  *RADIOLOGY REPORT*  Clinical Data: Left upper quadrant pain radiates to the back. Nausea.  The patient  is status post bilateral renal ablation for neoplasm  CT ABDOMEN AND PELVIS WITHOUT CONTRAST  Technique:  Multidetector CT imaging of the abdomen and pelvis was performed following the standard protocol without intravenous contrast.  Comparison: 12/17/2011  Findings:  Tiny left lower lobe pulmonary nodule is stable.  7 mm right middle lobe pulmonary nodule is also unchanged.  No focal abnormalities seen in the liver or spleen on this study performed without intravenous contrast material.  The stomach, duodenum, pancreas, gallbladder, and adrenal glands are unremarkable.  The kidneys are not well evaluated without intravenous contrast material.  Peripheral lesion in the interpolar region of the right kidney corresponds to the previously characterized ablation defect. This measures about 2.8 x 2.3 cm today compared to 3.2 cm previously.  The ablation defect seen previously in the posterior left kidney is not well seen on the study without intravenous contrast material.  There is bilateral perinephric edema/stranding.  No hydronephrosis. No renal stones.  No evidence for hydroureter on either side.  No ureteral or bladder stones.  No abdominal aortic aneurysm.  No free fluid or lymphadenopathy in the abdomen.  Imaging through the pelvis shows no free intraperitoneal fluid.  No pelvic sidewall lymphadenopathy.  There is some subtle edema/inflammation associated with the proximal sigmoid  colon, the region of diverticulitis characterized on the prior study.  This secondary change has improved in the interval.  The terminal ileum is normal.  The appendix is normal.  Bone windows reveal no worrisome lytic or sclerotic osseous lesions.  IMPRESSION: No urinary stones.  Ablation defects in both kidneys are not well characterized on this study performed without intravenous contrast material.  If there is concern for renal infection, repeat imaging after IV contrast administration is recommended.  Interval improvement and  edema/inflammation associate with the sigmoid colon.  No evidence for perforation or abscess at this time.  Stable appearance of the bilateral pulmonary nodules seen in the lung bases.  Original Report Authenticated By: ERIC A. MANSELL, M.D.   US Abdomen Complete  12/30/2011  *RADIOLOGY REPORT*  Clinical Data: Left-sided abdominal and back pain.  Status post bilateral renal ablations for renal neoplasm.  RENAL/URINARY TRACT ULTRASOUND COMPLETE  Comparison:  None.  Findings:  Right Kidney:  Measures 12.6 cm in length.  No evidence of hydronephrosis.  A mildly hyperechoic subcapsular mass is seen in the mid pole measuring 3.5 cm in maximum diameter.  Left Kidney:  Measures 11.8 cm length.  No evidence of hydronephrosis.  A mildly hyperechoic solid mass is seen in the lower pole of the left kidney measuring 5 cm in maximum diameter.  Bladder:  Unremarkable in appearance for degree of bladder distention.  IMPRESSION:  1.  Bilateral renal masses measuring 3.5 cm on the right and 5.0 cm on the left, consistent with the patient's known history of bilateral renal neoplasm. 2.  No evidence of hydronephrosis.  Original Report Authenticated By: Marlaine Hind, M.D.   Medications:  Scheduled Meds:   . sodium chloride   Intravenous STAT  . amLODipine  5 mg Oral Daily  . gabapentin  200 mg Oral TID  . heparin  5,000 Units Subcutaneous Q8H  .  HYDROmorphone (DILAUDID) injection  0.5 mg Intravenous Once  . insulin aspart protamine-insulin aspart  40 Units Subcutaneous BID WC  . morphine  4 mg Intravenous Once  . sodium chloride  1,000 mL Intravenous Once   Continuous Infusions:   . sodium chloride 999 mL (12/31/11 0700)   PRN Meds:.acetaminophen, acetaminophen, HYDROcodone-acetaminophen, HYDROmorphone (DILAUDID) injection, HYDROmorphone, ondansetron (ZOFRAN) IV, DISCONTD: HYDROmorphone Assessment/Plan: Patient Active Hospital Problem List: Abdominal pain-LUQ/L. flank (12/30/2011) -?neuropathic pain, pe  rIR -Started on neurontin, continue narcotics for pain management -renal US with no hydro, CT abd unrevealing  AKI (acute kidney injury) (12/30/2011)   -improving with IVF,holding ARB, continue hydration - follow and recheck Renal cell cancer/s/p bil. Renal cell ca cryoablations (12/30/2011)  -per [name], ?neuropathin pain - started on neurontin, continue analgesics for pain control Diverticulitis-history of (12/30/2011) - s/p completion of abx course   DM-with prior h/o AKI (12/30/2011) - continue 70/30, follow HTN (hypertension) (12/30/2011) -on norvasc, follow, ARB on hold as above     LOS: 1 day   Georgianne Gritz C 12/31/2011, 11:57 PM

## 2011-12-31 NOTE — Progress Notes (Signed)
Pt requested pain medication.  Sitting at window seat, agitated, but cooperative until vicodin and dilaudid administered.  Then pt began yelling and belligerent, cursing at this RN that he was "not going to sit up in here in pain and a doctor needs to come in and see him".  When I left the room, the pt was yelling , cursing- [id number] and S!@t  and G!@[id number].  Able to be heard in the hallway clearly with his door shut and a few doors away.  When reentered room to attempt to calm, pt was standing, came toward staff physically with continued yelling and cursing, waving arms and not listening to anything being said.  He demanded I leave the room.  Before leaving, I explained to S.O. That the language was disturbing the other pts and people in the halls.  She said "He will be like this until he gets his medicine".  Stated he took Dilaudid 8mg  po at home PTA.  Also , stated he had no problem getting off the dilaudid on his prior admission, and he doesn't understand why his dose was decreased during the night.  Dr. Dillard Essex paged.

## 2011-12-31 NOTE — Progress Notes (Signed)
Pt was very dissatisfied that the 1mg  of dilaudid had been discontinued through the night. Pt states that 0.5mg  of dilaudid barely took the edge off and he is still in unbearable pain. Gave 2 vicodin with 0.5mg  of dilaudid to see if that would help. Pt is very agitated and upset, he noted some new swelling on the left side of his abdomen. Abdomen very tender with palpation. Will continue to monitor pt.

## 2012-01-01 LAB — CBC
HCT: 35.7 % — ABNORMAL LOW (ref 39.0–52.0)
Hemoglobin: 12.1 g/dL — ABNORMAL LOW (ref 13.0–17.0)
MCV: 86.2 fL (ref 78.0–100.0)
RBC: 4.14 MIL/uL — ABNORMAL LOW (ref 4.22–5.81)
WBC: 4.7 10*3/uL (ref 4.0–10.5)

## 2012-01-01 LAB — BASIC METABOLIC PANEL
BUN: 15 mg/dL (ref 6–23)
CO2: 25 mEq/L (ref 19–32)
Chloride: 108 mEq/L (ref 96–112)
Creatinine, Ser: 1.38 mg/dL — ABNORMAL HIGH (ref 0.50–1.35)

## 2012-01-01 MED ORDER — AMLODIPINE BESYLATE 5 MG PO TABS: 5.0000 mg | ORAL_TABLET | Freq: Every day | ORAL | Status: DC

## 2012-01-01 MED ORDER — GABAPENTIN 100 MG PO CAPS: 200.0000 mg | ORAL_CAPSULE | Freq: Three times a day (TID) | ORAL | Status: DC

## 2012-01-01 MED ORDER — HYDROMORPHONE HCL 2 MG PO TABS: 2.0000 mg | ORAL_TABLET | ORAL | Status: AC | PRN

## 2012-01-01 NOTE — Progress Notes (Signed)
Pt states it is time for his medication. No ADN- face relaxed, sitting up in bed working on computer.  States pain level is at a 10 and never gets better than a 10. Has been refusing to wear monitor.  Explained policy to leave on tele for 48 hours per order and that is completed at 1700 today.  Became slightly hostile and said the story keeps changing, it was supposed to be 24 hours and it should have been removed yesterday.  Agreed to wear it today. Will ask Dr. Dillard Essex when rounds if able to remove.  ROM without guarding, able to reach over head and across body, twist in the bed without evidence of increased pain noted.  Meds given per order.  Will monitor.

## 2012-01-01 NOTE — Progress Notes (Signed)
Subjective: Patient without acute changes; renal function normalizing; still with intermittent episodes of neuropathic pain LUQ/left flank region usually heralded by preceeding chill per patient. Started on neurontin per primary sevice.  Objective: Vital signs in last 24 hours: Temp:  [97.5 F (36.4 C)-98.4 F (36.9 C)] 98.4 F (36.9 C) (01/27 0531) Pulse Rate:  [81-85] 81  (01/27 0531) Resp:  [18] 18  (01/27 0531) BP: (152-172)/(97-117) 158/98 mmHg (01/27 0536) SpO2:  [96 %-100 %] 99 % (01/27 0531) Weight:  [240 lb 1.3 oz (108.9 kg)] 240 lb 1.3 oz (108.9 kg) (01/27 0531) Last BM Date: 12/30/11  Intake/Output from previous day: 01/26 0701 - 01/27 0700 In: 3594.2 [P.O.:840; I.V.:2754.2] Out: -  Intake/Output this shift:    Patient awake/alert, chest- CTA bilat., heart RRR,  abd.- soft, obese, + BS, mildly tender LUQ/left flank region  Lab Results:   Basename 01/01/12 0530 12/31/11 0559  WBC 4.7 5.2  HGB 12.1* 12.7*  HCT 35.7* 36.5*  PLT 219 224   BMET  Basename 01/01/12 0530 12/31/11 0559  NA 141 142  K 3.9 3.7  CL 108 107  CO2 25 27  GLUCOSE 107* 124*  BUN 15 16  CREATININE 1.38* 2.09*  CALCIUM 8.9 8.8   PT/INR  Basename 12/31/11 0559  LABPROT 17.6*  INR 1.42   ABG No results found for this basename: PHART:2,PCO2:2,PO2:2,HCO3:2 in the last 72 hours  Studies/Results: Ct Abdomen Pelvis Wo Contrast  12/30/2011  *RADIOLOGY REPORT*  Clinical Data: Left upper quadrant pain radiates to the back. Nausea.  The patient is status post bilateral renal ablation for neoplasm  CT ABDOMEN AND PELVIS WITHOUT CONTRAST  Technique:  Multidetector CT imaging of the abdomen and pelvis was performed following the standard protocol without intravenous contrast.  Comparison: 12/17/2011  Findings:  Tiny left lower lobe pulmonary nodule is stable.  7 mm right middle lobe pulmonary nodule is also unchanged.  No focal abnormalities seen in the liver or spleen on this study performed  without intravenous contrast material.  The stomach, duodenum, pancreas, gallbladder, and adrenal glands are unremarkable.  The kidneys are not well evaluated without intravenous contrast material.  Peripheral lesion in the interpolar region of the right kidney corresponds to the previously characterized ablation defect. This measures about 2.8 x 2.3 cm today compared to 3.2 cm previously.  The ablation defect seen previously in the posterior left kidney is not well seen on the study without intravenous contrast material.  There is bilateral perinephric edema/stranding.  No hydronephrosis. No renal stones.  No evidence for hydroureter on either side.  No ureteral or bladder stones.  No abdominal aortic aneurysm.  No free fluid or lymphadenopathy in the abdomen.  Imaging through the pelvis shows no free intraperitoneal fluid.  No pelvic sidewall lymphadenopathy.  There is some subtle edema/inflammation associated with the proximal sigmoid colon, the region of diverticulitis characterized on the prior study.  This secondary change has improved in the interval.  The terminal ileum is normal.  The appendix is normal.  Bone windows reveal no worrisome lytic or sclerotic osseous lesions.  IMPRESSION: No urinary stones.  Ablation defects in both kidneys are not well characterized on this study performed without intravenous contrast material.  If there is concern for renal infection, repeat imaging after IV contrast administration is recommended.  Interval improvement and edema/inflammation associate with the sigmoid colon.  No evidence for perforation or abscess at this time.  Stable appearance of the bilateral pulmonary nodules seen in the lung bases.  Original Report Authenticated By: ERIC A. MANSELL, M.D.   US Abdomen Complete  12/30/2011  *RADIOLOGY REPORT*  Clinical Data: Left-sided abdominal and back pain.  Status post bilateral renal ablations for renal neoplasm.  RENAL/URINARY TRACT ULTRASOUND COMPLETE   Comparison:  None.  Findings:  Right Kidney:  Measures 12.6 cm in length.  No evidence of hydronephrosis.  A mildly hyperechoic subcapsular mass is seen in the mid pole measuring 3.5 cm in maximum diameter.  Left Kidney:  Measures 11.8 cm length.  No evidence of hydronephrosis.  A mildly hyperechoic solid mass is seen in the lower pole of the left kidney measuring 5 cm in maximum diameter.  Bladder:  Unremarkable in appearance for degree of bladder distention.  IMPRESSION:  1.  Bilateral renal masses measuring 3.5 cm on the right and 5.0 cm on the left, consistent with the patient's known history of bilateral renal neoplasm. 2.  No evidence of hydronephrosis.  Original Report Authenticated By: Marlaine Hind, M.D.    Anti-infectives: Anti-infectives    None      Assessment/Plan: s/p bilat renal cell ca cryoablations; now with ? Neuropathic pain LUQ/left flank region secondary to possible diaphragmatic irritation from cryo of left upper pole RCC. Continue with trial of neurontin, encourage adequate po intake, short term po dilaudid prn or consider short term toradol ( better anti-inflammatory control) as long as renal function remains normalized. Patient to f/u with Dr. Kathlene Cote on 01/10/12 at [facility].   LOS: 2 days    Sequan Auxier,D [facility] 01/01/2012

## 2012-01-01 NOTE — Progress Notes (Signed)
D/C instructions read to and by pt.  Verbalizes understanding of all instructions, and follow up.  Reviewed prescriptions with pt using teach back method.  No ADN.

## 2012-01-01 NOTE — Discharge Summary (Signed)
Discharge Note  Name: Brandon [name] MRN: [id number] DOB: [birth year] 51 y.o.  Date of Admission: 12/30/2011  7:33 AM Date of Discharge: 01/01/2012 Attending Physician: Sheila Oats, MD  Discharge Diagnosis: Principal Problem:  *Abdominal pain Active Problems:  AKI (acute kidney injury)  Renal cell cancer  Diverticulitis-history of  DM-with prior h/o AKI  HTN (hypertension)   Discharge Medications: Medication List  As of 01/01/2012  2:23 PM   STOP taking these medications         telmisartan 80 MG tablet         TAKE these medications         amLODipine 5 MG tablet   Commonly known as: NORVASC   Take 1 tablet (5 mg total) by mouth daily.      gabapentin 100 MG capsule   Commonly known as: NEURONTIN   Take 2 capsules (200 mg total) by mouth 3 (three) times daily.      HYDROmorphone 2 MG tablet   Commonly known as: DILAUDID   Take 1-2 tablets (2-4 mg total) by mouth every 4 (four) hours as needed for pain.      insulin NPH-insulin regular (70-30) 100 UNIT/ML injection   Commonly known as: NOVOLIN 70/30   Inject 40 Units into the skin 2 (two) times daily with a meal.            Disposition and follow-up:   Brandon [name] was discharged from [facility] in improved/stable condition.    Follow-up Appointments: Discharge Orders    Future Orders Please Complete By Expires   Diet Carb Modified      Increase activity slowly         Consultations:    Procedures Performed:  Ct Abdomen Pelvis Wo Contrast  12/30/2011  *RADIOLOGY REPORT*  Clinical Data: Left upper quadrant pain radiates to the back. Nausea.  The patient is status post bilateral renal ablation for neoplasm  CT ABDOMEN AND PELVIS WITHOUT CONTRAST  Technique:  Multidetector CT imaging of the abdomen and pelvis was performed following the standard protocol without intravenous contrast.  Comparison: 12/17/2011  Findings:  Tiny left lower lobe pulmonary nodule is stable.  7 mm right  middle lobe pulmonary nodule is also unchanged.  No focal abnormalities seen in the liver or spleen on this study performed without intravenous contrast material.  The stomach, duodenum, pancreas, gallbladder, and adrenal glands are unremarkable.  The kidneys are not well evaluated without intravenous contrast material.  Peripheral lesion in the interpolar region of the right kidney corresponds to the previously characterized ablation defect. This measures about 2.8 x 2.3 cm today compared to 3.2 cm previously.  The ablation defect seen previously in the posterior left kidney is not well seen on the study without intravenous contrast material.  There is bilateral perinephric edema/stranding.  No hydronephrosis. No renal stones.  No evidence for hydroureter on either side.  No ureteral or bladder stones.  No abdominal aortic aneurysm.  No free fluid or lymphadenopathy in the abdomen.  Imaging through the pelvis shows no free intraperitoneal fluid.  No pelvic sidewall lymphadenopathy.  There is some subtle edema/inflammation associated with the proximal sigmoid colon, the region of diverticulitis characterized on the prior study.  This secondary change has improved in the interval.  The terminal ileum is normal.  The appendix is normal.  Bone windows reveal no worrisome lytic or sclerotic osseous lesions.  IMPRESSION: No urinary stones.  Ablation defects in both kidneys are not well characterized  on this study performed without intravenous contrast material.  If there is concern for renal infection, repeat imaging after IV contrast administration is recommended.  Interval improvement and edema/inflammation associate with the sigmoid colon.  No evidence for perforation or abscess at this time.  Stable appearance of the bilateral pulmonary nodules seen in the lung bases.  Original Report Authenticated By: ERIC A. MANSELL, M.D.   US Abdomen Complete  12/30/2011  *RADIOLOGY REPORT*  Clinical Data: Left-sided abdominal  and back pain.  Status post bilateral renal ablations for renal neoplasm.  RENAL/URINARY TRACT ULTRASOUND COMPLETE  Comparison:  None.  Findings:  Right Kidney:  Measures 12.6 cm in length.  No evidence of hydronephrosis.  A mildly hyperechoic subcapsular mass is seen in the mid pole measuring 3.5 cm in maximum diameter.  Left Kidney:  Measures 11.8 cm length.  No evidence of hydronephrosis.  A mildly hyperechoic solid mass is seen in the lower pole of the left kidney measuring 5 cm in maximum diameter.  Bladder:  Unremarkable in appearance for degree of bladder distention.  IMPRESSION:  1.  Bilateral renal masses measuring 3.5 cm on the right and 5.0 cm on the left, consistent with the patient's known history of bilateral renal neoplasm. 2.  No evidence of hydronephrosis.  Original Report Authenticated By: Marlaine Hind, M.D.   Ct Abdomen Pelvis W Contrast  12/17/2011  *RADIOLOGY REPORT*  Clinical Data: Pain.  Renal ablation 2 weeks ago.  History of kidney cancer.  CT ABDOMEN AND PELVIS WITH CONTRAST  Technique:  Multidetector CT imaging of the abdomen and pelvis was performed following the standard protocol during bolus administration of intravenous contrast.  Contrast: 136mL OMNIPAQUE IOHEXOL 300 MG/ML IV SOLN  Comparison: Ablation CT of 12/02/2011.  Pre procedure CT abdomen 11/08/2011.  Most recent pelvic CT of 08/02/2011.  Findings: Pleural-based right middle lobe nodule is unchanged on image 2 at 7 mm.  A left lower lobe 4 mm nodule is similar on image 1.  Mild cardiomegaly without pericardial or pleural effusion.  Normal liver, spleen, stomach, pancreas, gallbladder, biliary tract, adrenal glands.  Interpolar right renal low density structure measures 3.2 cm on image 27 of series 2 versus 3.3 cm at the same level on the prior exam (when remeasured).  This is heterogeneously low in density, consistent with treated carcinoma.  At the site of the lower pole left renal ablation is  an area of absent  enhancement which measures 3.5 x 4.5 cm on image 32 of series 2.  There is mild surrounding perirenal edema.  No perirenal fluid collection. There is mild ill-defined fluid along the left psoas muscle on image 34.  The left renal vein is patent.  Adjacent bowel loops are unremarkable.  No collecting system complication identified. No retroperitoneal or retrocrural adenopathy.  There is mild edema surrounding the sigmoid colon, including on image 59 of series 2.  This is the site of numerous diverticula. Undescended distal transverse colon. Normal terminal ileum and appendix.  Normal small bowel without abdominal ascites.    No pelvic adenopathy.    Normal urinary bladder and prostate.  No significant free fluid.  Age advanced degenerative change of the bilateral hips.  Sacroiliac joints are nearly completely fused, presumably degenerative.  IMPRESSION:  1.  Mild uncomplicated sigmoid diverticulitis. 2.  Status post bilateral renal ablations.  Expected recent ablated changes surrounding the lower pole left kidney.  No acute complication identified. 3.  Bilateral lung base nodules remain indeterminate. Recommend attention on  follow-up.  Original Report Authenticated By: Areta Haber, M.D.     Admission HPI 51 yr old male with known h/o diverticulitis status post microperforation, renal cell carcinoma that is post ablation 10/12 and 12/28, moderately controlled diabetes mellitus on insulin with a past history of DKA, hypertension was seen recently at the Emergency room, and treated for a possible diverticulitis flare on 1/12 at the emergency room at [facility]. Was given Cipro and Metronidazole, was given about 2 weeks of it. Claims he finished the course of medications.  A new medication did not improve his pain and he completed actually course medication that he had at home prior to this and then completed a 14 day course of medication despite some nausea but still had abdominal pain. Patient subsequently went this  past Sunday, January 20 to [facility] emergency room had another CT scan because of intractable abdominal pain  Hasn't been able to eat and drink for about 1-2 days. No diarrhoea, but has had abdominal pain in the lower abdomen in the rib cage and right below this. Pain has concentrated on where he had recent surgery for removal of tumours of on the kidney (last time 12/28) , mainly over the Left kidney. The pain is constant and not relieved by hydrocodone oxycodone a few seconds lungs rib cage is when he turns onto his left side he has increasing amounts of pain. He has had 5-6 CT scans of the abdomen in the past couple of months for this pain but have not been able to figure out where it's coming from  Chem profile showed a BUN to creatinine ratio thirteen over 2.28, with no corresponding increase in his BUN. His total protein on doing LFTs showed protein of 2.8. Hemoglobin 15.1  CT abdomen done 12/30/2011 showed no urinary stones and the patient affects in both kidneys not well characterized unsteady without intravenous contrast. Interval improvement of the information sigmoid colon stable appearance of bilateral pulmonary nodules and lung base  Physical exam General Appearance:  Alert, cooperative, no distress, obese   Lungs:  Clear to auscultation bilaterally, respirations unlabored   Heart:  Regular rate and rhythm, S1 and S2 normal, no murmur, rub  or gallop   Abdomen:  Soft , LUQ tenderness, also tender inl.flank area, no rebound , bowel sounds active all four quadrants,  no masses, no organomegaly   Extremities:  Extremities normal, atraumatic, no cyanosis or edema   Neurologic:  CNII-XII intact, normal strength, nonfocal     Hospital Course by problem list: Principal Problem:  *Abdominal pain Active Problems:  AKI (acute kidney injury)  Renal cell cancer  Diverticulitis-history of  DM-with prior h/o AKI  HTN (hypertension)  Patient Active Hospital Problem List: Abdominal  pain-LUQ/L. flank (12/30/2011) -likely neuropathic septic secondary to a possible diaphragmatic irritation from cryo, per IR Upon admission the patient had a CT scan of his abdomen and pelvis which showed interval improvement in edema/inflammation associated with the cecum colon. No evidence of perforation abscess. Stable appearance of bilateral pulmonary nodules seen in the lung bases. A renal ultrasound was done and it showed bilateral renal masses measuring 3.5 cm on the right and 5 cm on the left consistent with patient's known history of bilateral renal neoplasm. No evidence of hydronephrosis. Patient was placed on him oral narcotics  as well as IV narcotics as needed for breakthrough pain. Interventional radiology was consulted and Dr. Kathlene Cote saw the patient and his impression was that this was likely neuropathic left  upper quadrant/ left flank pain secondary to possible diaphragmatic irritation from cryo- of left upper pole renal cell cancer and the. He recommended a trial of Neurontin. This was started and the patient and states that it made a difference and to his pain. I are also recommended a trial of an anti-inflammatory but given his renal function that is just now improved to 1.38,anti-inflammatories are being held for now especially since he's had a good response to the Neurontin. He is clinically improved, and will be discharged at this time to follow up with Dr. Kathlene Cote as scheduled on 01/10/2012 AKI (acute kidney injury) (12/30/2011)  his renal function improved, and IV fluids and off the ARB.his been instructed to hold the ARB, follow up with his primary care physician.  Renal cell cancer/s/p bil. Renal cell ca cryoablations (12/30/2011) As discussed above.  DM -He was maintained on 70/30 and is to continue upon discharge. HTN (hypertension) (12/30/2011)  His ARB was held in the hospital secondary to the acute renal failure, and he was placed on Norvasc for blood pressure control and his  to continue this upon discharge.       Discharge Vitals:  BP 158/98  Pulse 81  Temp(Src) 98.4 F (36.9 C) (Oral)  Resp 18  Ht 6\' 2"  (1.88 m)  Wt 108.9 kg (240 lb 1.3 oz)  BMI 30.82 kg/m2  SpO2 99%  Discharge Labs:  Results for orders placed during the hospital encounter of 12/30/11 (from the past 24 hour(s))  CBC     Status: Abnormal   Collection Time   01/01/12  5:30 AM      Component Value Range   WBC 4.7  4.0 - 10.5 (K/uL)   RBC 4.14 (*) 4.22 - 5.81 (MIL/uL)   Hemoglobin 12.1 (*) 13.0 - 17.0 (g/dL)   HCT 35.7 (*) 39.0 - 52.0 (%)   MCV 86.2  78.0 - 100.0 (fL)   MCH 29.2  26.0 - 34.0 (pg)   MCHC 33.9  30.0 - 36.0 (g/dL)   RDW 12.2  11.5 - 15.5 (%)   Platelets 219  150 - 400 (K/uL)  BASIC METABOLIC PANEL     Status: Abnormal   Collection Time   01/01/12  5:30 AM      Component Value Range   Sodium 141  135 - 145 (mEq/L)   Potassium 3.9  3.5 - 5.1 (mEq/L)   Chloride 108  96 - 112 (mEq/L)   CO2 25  19 - 32 (mEq/L)   Glucose, Bld 107 (*) 70 - 99 (mg/dL)   BUN 15  6 - 23 (mg/dL)   Creatinine, Ser 1.38 (*) 0.50 - 1.35 (mg/dL)   Calcium 8.9  8.4 - 10.5 (mg/dL)   GFR calc non Af Amer 59 (*) >90 (mL/min)   GFR calc Af Amer 68 (*) >90 (mL/min)    SignedSheila Oats 01/01/2012, 2:23 PM

## 2012-01-01 NOTE — Progress Notes (Signed)
Pt has been stating all shift that  There has been  No difference in the pain medicine.  He states the pain has been at a 9-10 all day without change.  States that the best the pain gets to is a 9.  Dr.Viyuoh in room ,pt now states the pain is at a 6.   Stated to doctor that since he took the second dose of neurontin that the pain has improved and he can tell the swelling has gone down (no swelling visualized by staff).  Currently stating his pain is a 6.

## 2012-01-01 NOTE — Progress Notes (Signed)
Pt and s.o requested to speak with Lennette Bihari PA.  Paged and notified Lennette Bihari- he returned the call to the room. Pt states his questions were answered satisfactorly.

## 2012-01-03 ENCOUNTER — Telehealth: Payer: Self-pay | Admitting: Radiology

## 2012-01-04 NOTE — Telephone Encounter (Signed)
Pt called on 01/04/2012 approx. 0900 stating that he needed to schedule f/u app't w/ [name] for 01/10/2012.  Pt related that he had been seen at [facility] on 12/30/2011 for continued pain.  CT obtained at that time.     Patient was told that appointments available for 01/10/2012 but that [name] would need to be contacted in reference to obtaining a CT with contrast prior to f/u app't (as he had just had a CT on 12/30/2011).  Pt became very angry, yelling and hung up.  Patient's fiancee, [name] called about 12:10, very angry that appointment had not been scheduled. She insisted that patient be seen immediately. An appointment was offered for either 01/04/2012 or 01/05/2012 with one of Dr Margaretmary Dys partners (Dr Darreld Mclean on vacation this week).   Explained to her that we merely wanted to confirm w/ [name] whether pt needed a CT w/ contrast prior to f/u (as patient had CT on 12/30/2011).  Also explained to her that and no one had refused to schedule an appointment.   She stated that she would check w/ patient and call back.  At approximately 2:40 pm, [name] called back to schedule appointment for 01/10/2012.  She stated that patient ONLY wanted to be seen by [name].  Appointment has been scheduled for f/u office visit for 01/10/2012

## 2012-01-09 ENCOUNTER — Encounter (HOSPITAL_COMMUNITY): Payer: Self-pay | Admitting: Emergency Medicine

## 2012-01-09 ENCOUNTER — Emergency Department (HOSPITAL_COMMUNITY)
Admission: EM | Admit: 2012-01-09 | Discharge: 2012-01-09 | Disposition: A | Discharge status: Home / Self Care | Payer: Self-pay | Attending: Emergency Medicine | Admitting: Emergency Medicine

## 2012-01-09 DIAGNOSIS — Z85528 Personal history of other malignant neoplasm of kidney: Secondary | ICD-10-CM | POA: Insufficient documentation

## 2012-01-09 DIAGNOSIS — E119 Type 2 diabetes mellitus without complications: Secondary | ICD-10-CM | POA: Insufficient documentation

## 2012-01-09 DIAGNOSIS — R10819 Abdominal tenderness, unspecified site: Secondary | ICD-10-CM | POA: Insufficient documentation

## 2012-01-09 DIAGNOSIS — Z794 Long term (current) use of insulin: Secondary | ICD-10-CM | POA: Insufficient documentation

## 2012-01-09 DIAGNOSIS — I10 Essential (primary) hypertension: Secondary | ICD-10-CM | POA: Insufficient documentation

## 2012-01-09 DIAGNOSIS — R109 Unspecified abdominal pain: Secondary | ICD-10-CM | POA: Insufficient documentation

## 2012-01-09 MED ORDER — HYDROMORPHONE HCL 4 MG PO TABS: 4.0000 mg | ORAL_TABLET | Freq: Four times a day (QID) | ORAL | Status: DC | PRN

## 2012-01-09 MED ORDER — HYDROMORPHONE HCL PF 2 MG/ML IJ SOLN
2.0000 mg | Freq: Once | INTRAMUSCULAR | Status: AC
  Administered 2012-01-09: 2 mg via INTRAMUSCULAR
  Filled 2012-01-09: qty 1

## 2012-01-09 NOTE — ED Notes (Signed)
Pt states he had surgery last one in December and has been having chronic pain since and his dr is out of town and he is out of his medication and cannot get them refilled til he returns and is having pain

## 2012-01-09 NOTE — ED Provider Notes (Signed)
History     CSN: [id number]  Arrival date & time 01/09/12  1911   First MD Initiated Contact with Patient 01/09/12 2216      Chief Complaint  Patient presents with  . Pain    (Consider location/radiation/quality/duration/timing/severity/associated sxs/prior treatment) Patient is a 51 y.o. male presenting with abdominal pain. The history is provided by the patient (Patient states that he has had abdominal pain ever since his surgery. Patient states his daughter has decided that the pain is from some nerves that were cut during surgery. He is out of his pain medicine he is to see his doctor on Thursday patient has had). No language interpreter was used.  Abdominal Pain The primary symptoms of the illness include abdominal pain. The primary symptoms of the illness do not include fever, fatigue or diarrhea. The current episode started more than 2 days ago. The onset of the illness was gradual. The problem has not changed since onset. The illness is associated with eating. The patient states that she believes she is currently not pregnant. The patient has not had a change in bowel habit. Symptoms associated with the illness do not include chills, anorexia, hematuria, frequency or back pain. Significant associated medical issues do not include PUD.    Past Medical History  Diagnosis Date  . Hypertension   . Diabetes mellitus     DKA sept admission  . Diverticulitis     s/p micorperforation Sept 2012-managed conservatively by Gen surgery  . Kidney tumor     Renal cell CA    Past Surgical History  Procedure Date  . Kidney surgery     last one 12/28, prior one was October 2012-Dr. Kathlene Cote did the surgery.    Family History  Problem Relation Age of Onset  . Diabetes Father     History  Substance Use Topics  . Smoking status: Never Smoker   . Smokeless tobacco: Never Used  . Alcohol Use: No      Review of Systems  Constitutional: Negative for fever, chills and fatigue.    HENT: Negative for congestion, sinus pressure and ear discharge.   Eyes: Negative for discharge.  Respiratory: Negative for cough.   Cardiovascular: Negative for chest pain.  Gastrointestinal: Positive for abdominal pain. Negative for diarrhea and anorexia.  Genitourinary: Negative for frequency and hematuria.  Musculoskeletal: Negative for back pain.  Skin: Negative for rash.  Neurological: Negative for seizures and headaches.  Hematological: Negative.   Psychiatric/Behavioral: Negative for hallucinations.    Allergies  Review of patient's allergies indicates no known allergies.  Home Medications   Current Outpatient Rx  Name Route Sig Dispense Refill  . HYDROMORPHONE HCL 2 MG PO TABS Oral Take 1-2 tablets (2-4 mg total) by mouth every 4 (four) hours as needed for pain. 60 tablet 0  . INSULIN ISOPHANE & REGULAR (70-30) 100 UNIT/ML [place] SUSP Subcutaneous Inject 40 Units into the skin 2 (two) times daily with a meal.     . TELMISARTAN 80 MG PO TABS Oral Take 80 mg by mouth daily.    [name] AMLODIPINE BESYLATE 5 MG PO TABS Oral Take 1 tablet (5 mg total) by mouth daily. 30 tablet 0  . GABAPENTIN 100 MG PO CAPS Oral Take 2 capsules (200 mg total) by mouth 3 (three) times daily. 180 capsule 0  . HYDROMORPHONE HCL 4 MG PO TABS Oral Take 1 tablet (4 mg total) by mouth every 6 (six) hours as needed for pain. 20 tablet 0  BP 168/93  Pulse 97  Temp(Src) 98.7 F (37.1 C) (Oral)  Resp 24  SpO2 100%  Physical Exam  Constitutional: He is oriented to person, place, and time. He appears well-developed.  HENT:  Head: Normocephalic and atraumatic.  Eyes: Conjunctivae and EOM are normal. No scleral icterus.  Neck: Neck supple. No thyromegaly present.  Cardiovascular: Normal rate and regular rhythm.  Exam reveals no gallop and no friction rub.   No murmur heard. Pulmonary/Chest: No stridor. He has no wheezes. He has no rales. He exhibits no tenderness.  Abdominal: He exhibits no distension.  There is tenderness. There is no rebound.  Musculoskeletal: Normal range of motion. He exhibits no edema.  Lymphadenopathy:    He has no cervical adenopathy.  Neurological: He is oriented to person, place, and time. Coordination normal.  Skin: No rash noted. No erythema.  Psychiatric: He has a normal mood and affect. His behavior is normal.    ED Course  Procedures (including critical care time)  Labs Reviewed - No data to display No results found.   1. Abdominal pain       MDM  Exacerbation of chronic abd pain        Maudry Diego, MD 01/09/12 2320

## 2012-01-09 NOTE — ED Notes (Signed)
Pt in the hall R/T no beds at present

## 2012-01-09 NOTE — ED Notes (Signed)
Pt is completely pain free he states

## 2012-01-10 ENCOUNTER — Ambulatory Visit
Admission: RE | Admit: 2012-01-10 | Discharge: 2012-01-10 | Disposition: A | Discharge status: Home / Self Care | Payer: Self-pay | Source: Ambulatory Visit | Attending: Interventional Radiology | Admitting: Interventional Radiology

## 2012-01-10 DIAGNOSIS — N2889 Other specified disorders of kidney and ureter: Secondary | ICD-10-CM

## 2012-01-10 NOTE — Progress Notes (Addendum)
Patient states the only time he has pain relief is if Dilaudid is administered IM or IV along with Vicodin PO, but never by the PO route alone.  Complains of being cold all the time when he usually is hot natured.  Complains of burning and sensitivity on skin from abdomen under rib cage around to lower back bilaterally.  Sometimes complains of stabbing pain.  States stabbing, unbearable pain is sometimes precipitated by a chill.  Patient states he has 'lost a bunch of weight" but doesn't know how much jkl

## 2012-01-18 ENCOUNTER — Telehealth: Payer: Self-pay | Admitting: Radiology

## 2012-01-18 MED ORDER — HYDROCODONE-ACETAMINOPHEN 5-500 MG PO TABS: 1.0000 | ORAL_TABLET | Freq: Four times a day (QID) | ORAL | Status: DC | PRN

## 2012-01-18 MED ORDER — HYDROMORPHONE HCL 4 MG PO TABS: 4.0000 mg | ORAL_TABLET | ORAL | Status: DC | PRN

## 2012-01-18 NOTE — Telephone Encounter (Signed)
Pt's fiancee, [name], called stating that patient is continuing to have pain & that he is out of Rx pain med.  Notified [name] re:  Refills.  Vicodin Rx called to [facility] on [address]

## 2012-01-19 ENCOUNTER — Emergency Department (HOSPITAL_COMMUNITY)
Admission: EM | Admit: 2012-01-19 | Discharge: 2012-01-20 | Disposition: A | Discharge status: Home / Self Care | Payer: Self-pay | Attending: Emergency Medicine | Admitting: Emergency Medicine

## 2012-01-19 DIAGNOSIS — I10 Essential (primary) hypertension: Secondary | ICD-10-CM | POA: Insufficient documentation

## 2012-01-19 DIAGNOSIS — Z85528 Personal history of other malignant neoplasm of kidney: Secondary | ICD-10-CM | POA: Insufficient documentation

## 2012-01-19 DIAGNOSIS — R109 Unspecified abdominal pain: Secondary | ICD-10-CM

## 2012-01-19 DIAGNOSIS — E119 Type 2 diabetes mellitus without complications: Secondary | ICD-10-CM | POA: Insufficient documentation

## 2012-01-19 DIAGNOSIS — M545 Low back pain, unspecified: Secondary | ICD-10-CM | POA: Insufficient documentation

## 2012-01-19 LAB — URINALYSIS, ROUTINE W REFLEX MICROSCOPIC
Bilirubin Urine: NEGATIVE
Glucose, UA: NEGATIVE mg/dL
Hgb urine dipstick: NEGATIVE
Ketones, ur: NEGATIVE mg/dL
Leukocytes, UA: NEGATIVE
Nitrite: NEGATIVE
Protein, ur: NEGATIVE mg/dL
Specific Gravity, Urine: 1.019 (ref 1.005–1.030)
Urobilinogen, UA: 1 mg/dL (ref 0.0–1.0)
pH: 7 (ref 5.0–8.0)

## 2012-01-19 MED ORDER — HYDROMORPHONE HCL PF 2 MG/ML IJ SOLN
2.0000 mg | Freq: Once | INTRAMUSCULAR | Status: AC
  Administered 2012-01-19: 2 mg via INTRAMUSCULAR
  Filled 2012-01-19: qty 1

## 2012-01-19 MED ORDER — DIAZEPAM 5 MG PO TABS
5.0000 mg | ORAL_TABLET | Freq: Once | ORAL | Status: AC
  Administered 2012-01-19: 5 mg via ORAL
  Filled 2012-01-19: qty 1

## 2012-01-19 MED ORDER — ONDANSETRON 8 MG PO TBDP
8.0000 mg | ORAL_TABLET | Freq: Once | ORAL | Status: AC
  Administered 2012-01-19: 8 mg via ORAL
  Filled 2012-01-19: qty 1

## 2012-01-19 NOTE — ED Notes (Addendum)
Pt had tumor removed from left kidney and pt has had chronic pain since this was done.  Is usually on dilaudid po but has been out x 4 days

## 2012-01-19 NOTE — ED Notes (Signed)
MD at bedside. 

## 2012-01-20 LAB — POCT I-STAT, CHEM 8
BUN: 10 mg/dL (ref 6–23)
Calcium, Ion: 1.24 mmol/L (ref 1.12–1.32)
Chloride: 105 mEq/L (ref 96–112)
Potassium: 3.3 mEq/L — ABNORMAL LOW (ref 3.5–5.1)

## 2012-01-20 MED ORDER — HYDROCODONE-ACETAMINOPHEN 5-325 MG PO TABS: 1.0000 | ORAL_TABLET | ORAL | Status: DC | PRN

## 2012-01-20 NOTE — ED Notes (Signed)
MD at bedside. 

## 2012-01-20 NOTE — Discharge Instructions (Signed)
Please read over the instructions below. We have treated you for your left flank pain tonight and you admit you're feeling much better. Your urine and blood work tonight were normal. There is no indication for infection. Take the prescribed medication for pain as directed. Call tomorrow to arrange follow up with Dr. Kathlene Cote as planned. Return for worsening symptoms.  Flank Pain Flank pain refers to pain that is located on the side of the body between the upper abdomen and the back. It can be caused by many things. CAUSES  Some of the more common causes of flank pain include:  Muscle strain.   Muscle spasms.   A disease of your spine (vertebral disk disease).   A lung infection (pneumonia).   Fluid around your lungs (pulmonary edema).   A kidney infection.   Kidney stones.   A very painful skin rash on only one side of your body (shingles).   Gallbladder disease.  DIAGNOSIS  Blood tests, urine tests, and X-rays may help your caregiver determine what is wrong. TREATMENT  The treatment of pain depends on the cause. Your caregiver will determine what treatment will work best for you. [place] care will depend on the cause of your pain.   Some medications may help relieve the pain. Take medication for relief of pain as directed by your caregiver.   Tell your caregiver about any changes in your pain.   Follow up with your caregiver.  SEEK IMMEDIATE MEDICAL CARE IF:   Your pain is not controlled with medication.   The pain increases.   You have abdominal pain.   You have shortness of breath.   You have persistent nausea or vomiting.   You have swelling in your abdomen.   You feel faint or pass out.   You have a temperature by mouth above 102 F (38.9 C), not controlled by medicine.  MAKE SURE YOU:   Understand these instructions.   Will watch your condition.   Will get help right away if you are not doing well or get worse.  Document  Released: 01/12/2006 Document Revised: 08/03/2011 Document Reviewed: 05/08/2010 [facility] Patient Information 2012 [place].

## 2012-01-23 NOTE — ED Provider Notes (Signed)
History     CSN: [id number]  Arrival date & time 01/19/12  1830   First MD Initiated Contact with Patient 01/19/12 2009      No chief complaint on file.   HPI: Patient is a 51 y.o. male presenting with flank pain.  Flank Pain This is a recurrent problem. The current episode started 1 to 4 weeks ago. The problem occurs intermittently. The problem has been unchanged. Associated symptoms include abdominal pain. Pertinent negatives include no chest pain, chills, fever, urinary symptoms or vomiting. The symptoms are aggravated by nothing. He has tried oral narcotics for the symptoms. The treatment provided no relief.  Pt reports persistent (L) flank pain since a (L) kidney needle biopsy in 11/2011. States was told by [name] may be possibe nerve damage. Pt also w/ hx of (L) renal cancerous tumor. States pain now wraps all the way around his lower abd.andlower back. States has been taking Dilaudid and Vicodin for pain but has recently run out of the Dilaudid and Vicodin alone not working.  Past Medical History  Diagnosis Date  . Hypertension   . Diabetes mellitus     DKA sept admission  . Diverticulitis     s/p micorperforation Sept 2012-managed conservatively by Gen surgery  . Kidney tumor     Renal cell CA    Past Surgical History  Procedure Date  . Kidney surgery     last one 12/28, prior one was October 2012-Dr. Kathlene Cote did the surgery.    Family History  Problem Relation Age of Onset  . Diabetes Father     History  Substance Use Topics  . Smoking status: Never Smoker   . Smokeless tobacco: Never Used  . Alcohol Use: No      Review of Systems  Constitutional: Negative.  Negative for fever and chills.  HENT: Negative.   Eyes: Negative.   Respiratory: Negative.   Cardiovascular: Negative.  Negative for chest pain.  Gastrointestinal: Negative.  Positive for abdominal pain. Negative for vomiting.  Genitourinary: Positive for flank pain.  Musculoskeletal:  Negative.   Skin: Negative.   Neurological: Negative.   Hematological: Negative.   Psychiatric/Behavioral: Negative.     Allergies  Neurontin  Home Medications   Current Outpatient Rx  Name Route Sig Dispense Refill  . GABAPENTIN 100 MG PO CAPS Oral Take 100-200 mg by mouth daily.    [name] HYDROCODONE-ACETAMINOPHEN 5-500 MG PO TABS Oral Take 2 tablets by mouth See admin instructions. Take 2 tablets every 4 to 6 hours as needed for pain.    . INSULIN ISOPHANE & REGULAR (70-30) 100 UNIT/ML [place] SUSP Subcutaneous Inject 40 Units into the skin 2 (two) times daily with a meal.     . TELMISARTAN 80 MG PO TABS Oral Take 80 mg by mouth daily.    [name] HYDROCODONE-ACETAMINOPHEN 5-325 MG PO TABS Oral Take 1 tablet by mouth every 4 (four) hours as needed for pain (1 to 2 Q4H PRN pain). 10 tablet 0    BP 156/96  Pulse 79  Temp(Src) 98.6 F (37 C) (Oral)  Resp 16  SpO2 97%  Physical Exam  Constitutional: He appears well-developed and well-nourished.  HENT:  Head: Normocephalic and atraumatic.  Eyes: Conjunctivae are normal.  Neck: Neck supple.  Cardiovascular: Normal rate and regular rhythm.   Pulmonary/Chest: Effort normal and breath sounds normal.  Abdominal: Soft. Bowel sounds are normal. There is no tenderness.  Genitourinary:       No CVA or (L) flank  TTP  Musculoskeletal: Normal range of motion.  Neurological: He is alert.  Skin: Skin is warm and dry.  Psychiatric: He has a normal mood and affect.    ED Course  Procedures  Pt reports pain much improved w/ medication. Will plan for d/c home w/ short course of meds for pain and encourage f/u w/ his dr as planned. Pt agreeable w/ plan. Labs Reviewed  POCT I-STAT, CHEM 8 - Abnormal; Notable for the following:    Potassium 3.3 (*)    Glucose, Bld 129 (*)    Hemoglobin 12.2 (*)    HCT 36.0 (*)    All other components within normal limits  URINALYSIS, ROUTINE W REFLEX MICROSCOPIC  LAB REPORT - SCANNED   No results found.   1.  Flank pain       MDM  HPI/PE and clinical findings c/w 1. Recurrent flank/abd pain        Jeryl Columbia, NP 01/23/12 [phone]

## 2012-01-24 ENCOUNTER — Emergency Department (HOSPITAL_COMMUNITY)
Admission: EM | Admit: 2012-01-24 | Discharge: 2012-01-24 | Disposition: A | Discharge status: Home / Self Care | Payer: Self-pay | Attending: Emergency Medicine | Admitting: Emergency Medicine

## 2012-01-24 ENCOUNTER — Telehealth: Payer: Self-pay | Admitting: Emergency Medicine

## 2012-01-24 ENCOUNTER — Encounter (HOSPITAL_COMMUNITY): Payer: Self-pay | Admitting: *Deleted

## 2012-01-24 DIAGNOSIS — I10 Essential (primary) hypertension: Secondary | ICD-10-CM | POA: Insufficient documentation

## 2012-01-24 DIAGNOSIS — R109 Unspecified abdominal pain: Secondary | ICD-10-CM | POA: Insufficient documentation

## 2012-01-24 DIAGNOSIS — G8929 Other chronic pain: Secondary | ICD-10-CM | POA: Insufficient documentation

## 2012-01-24 DIAGNOSIS — E119 Type 2 diabetes mellitus without complications: Secondary | ICD-10-CM | POA: Insufficient documentation

## 2012-01-24 MED ORDER — HYDROMORPHONE HCL 4 MG PO TABS: 4.0000 mg | ORAL_TABLET | ORAL | Status: AC | PRN

## 2012-01-24 MED ORDER — HYDROCODONE-ACETAMINOPHEN 5-500 MG PO TABS: 1.0000 | ORAL_TABLET | Freq: Four times a day (QID) | ORAL | Status: DC | PRN

## 2012-01-24 NOTE — ED Notes (Signed)
Pt states constant sever pain travels "all over my body in different areas at different times". Pt reports decreased appetite and fluid intake. Pt reports last saw Dr Deniece Portela last Tuesday.Pt states last pain med today at 1pm, dilaudid 4mg .

## 2012-01-24 NOTE — ED Notes (Signed)
Lauris Poag, MD at bedside.

## 2012-01-24 NOTE — Telephone Encounter (Signed)
DAUGHER- LISA CALLED TO SEE IF [name] WILL REFER PT TO PAIN MANAGEMENT.  PT IS IN A LOT OF PAIN AND HAS BEEN DOUBLING UP ON PAIN MEDS AND THEY ARE NOT HELPING.  01-25-12- Pain in abd sides, rib area, burning sensation (nerve pain), went to ER last night for the pain, per [name] daughter.  LMOVM FOR LISA TO MAKE HER AWARE THAT WE ARE REFERRING [place] TO [facility] TO DR MARK PHILLIPS AND THEY WILL CONTACT THEM DIRECTLY TO SET UP APPT.

## 2012-01-24 NOTE — ED Notes (Signed)
Pt states he had surgery in dec 2012 for removal of cancer tumor on his left kidney. Pt states they hit a nerve and now he stays in chronic pain. Pt states he does not think he has needs any scans but wants pain medication.

## 2012-01-24 NOTE — ED Provider Notes (Signed)
History     CSN: [id number]  Arrival date & time 01/24/12  1620   First MD Initiated Contact with Patient 01/24/12 1859      Chief Complaint  Patient presents with  . Flank Pain   patient presents with chronic bilateral flank pain that has been present over the past several months. He is currently on Dilaudid and Vicodin, which was prescribed by his interventional radiologist. He does have a history of renal cancer. His tumor. He states he suffered nerve damage from ablation. He denies any acute problems. He has had no hematuria. No fever. No dizziness or syncope. He just states that the pain medications are "not working." He has apparently been referred to a pain management specialist, however, he states he cannot get into next month. He has no other active issues at this time. Apparently no history of renal cell carcinoma, which she states is in remission. He has had recent CAT scans and ultrasounds. Patient states he has severe, chronic pain and is requesting refills for his prescriptions  (Consider location/radiation/quality/duration/timing/severity/associated sxs/prior treatment) HPI  Past Medical History  Diagnosis Date  . Hypertension   . Diabetes mellitus     DKA sept admission  . Diverticulitis     s/p micorperforation Sept 2012-managed conservatively by Gen surgery  . Kidney tumor     Renal cell CA    Past Surgical History  Procedure Date  . Kidney surgery     last one 12/28, prior one was October 2012-Dr. Kathlene Cote did the surgery.    Family History  Problem Relation Age of Onset  . Diabetes Father     History  Substance Use Topics  . Smoking status: Never Smoker   . Smokeless tobacco: Never Used  . Alcohol Use: No      Review of Systems  All other systems reviewed and are negative.    Allergies  Neurontin  Home Medications   Current Outpatient Rx  Name Route Sig Dispense Refill  . HYDROCODONE-ACETAMINOPHEN 5-500 MG PO TABS Oral Take 2 tablets by  mouth See admin instructions. Take 2 tablets every 4 to 6 hours as needed for pain.    [name] HYDROMORPHONE HCL 4 MG PO TABS Oral Take 4 mg by mouth every 4 (four) hours as needed. pain    . INSULIN ISOPHANE & REGULAR (70-30) 100 UNIT/ML [place] SUSP Subcutaneous Inject 40 Units into the skin 2 (two) times daily with a meal.     . TELMISARTAN 80 MG PO TABS Oral Take 80 mg by mouth daily.    [name] HYDROCODONE-ACETAMINOPHEN 5-500 MG PO TABS Oral Take 1-2 tablets by mouth every 6 (six) hours as needed for pain. 15 tablet 0  . HYDROMORPHONE HCL 4 MG PO TABS Oral Take 1 tablet (4 mg total) by mouth every 4 (four) hours as needed for pain. 30 tablet 0    BP 189/111  Pulse 99  Temp(Src) 98.6 F (37 C) (Oral)  Resp 18  Ht 6\' 2"  (1.88 m)  Wt 235 lb (106.595 kg)  BMI 30.17 kg/m2  SpO2 100%  Physical Exam  Nursing note and vitals reviewed. Constitutional: He is oriented to person, place, and time. He appears well-developed and well-nourished.  HENT:  Head: Normocephalic and atraumatic.  Eyes: Conjunctivae and EOM are normal. Pupils are equal, round, and reactive to light.  Neck: Neck supple.  Cardiovascular: Normal rate and regular rhythm.  Exam reveals no gallop and no friction rub.   No murmur heard. Pulmonary/Chest: Breath sounds  normal. He has no wheezes. He has no rales. He exhibits no tenderness.  Abdominal: Soft. Bowel sounds are normal. He exhibits no distension. There is no tenderness. There is no rebound and no guarding.  Musculoskeletal: Normal range of motion. He exhibits no edema and no tenderness.  Neurological: He is alert and oriented to person, place, and time. No cranial nerve deficit. Coordination normal.  Skin: Skin is warm and dry. No rash noted.  Psychiatric: He has a normal mood and affect.    ED Course  Procedures (including critical care time)  Labs Reviewed - No data to display No results found.   1. Chronic flank pain       MDM  Patient is seen and examined, initial  history and physical is completed. Evaluation initiated    Long discussion with the patient and his family and he was told that we do understand. He does have severe, chronic pain, however, the emergency department was not the proper place to treat chronic pain. I did tell him that I would refill a short dose of his prescriptions at this time. However, he will need to obtain all further narcotic prescriptions from a primary care doctor in a specialist, not the ER   No indication for lab studies or imaging at this time    Collier Salina A. Lauris Poag, MD 01/24/12 [place]

## 2012-01-24 NOTE — ED Provider Notes (Signed)
Medical screening examination/treatment/procedure(s) were performed by non-physician practitioner and as supervising physician I was immediately available for consultation/collaboration.   Maudry Diego, MD 01/24/12 1432

## 2012-01-24 NOTE — Discharge Instructions (Signed)
[place], [place] [address]?  [address]  Edit details  [phone]  Anesthesiologist, Pain Management [place]  [place], [place] [address]  Phone: [phone]    RESOURCE GUIDE  Dental Problems  Patients with Medicaid: [facility] [phone] [address] Phone:  [phone]                                                  Phone:  [phone]  If unable to pay or uninsured, contact:  [facility] or [facility]. to become qualified for the adult dental clinic.  Chronic Pain Problems Contact [facility]  [phone] Patients need to be referred by their primary care doctor.  Insufficient Money for Medicine Contact [employer]:  call "211" or [place] [phone].  No Primary Care Doctor Call [facility]  [phone] Other agencies that provide inexpensive medical care    [place]  [phone]    [facility]  [place]  [phone]    [facility]  [phone]    [facility]  [place]  [place]  [phone] [place]   [phone] (emergency services [phone])  Substance Abuse [facility]  [phone] [facility] [phone] [facility] [place] [phone] [facility] [phone] Residential & Outpatient Substance Abuse Program  [phone]  Abuse/Neglect [place] [phone] [place] [phone] (After Hours)  Emergency [place] [phone]  [place] at the [place] [phone] [place] [phone]  MRSA Hotline #:   331-178-6667    [facility] [place] Dept. [address]      [place] Phone:  [phone]  Phone:  [phone]                 Phone:  [place] Phone:  [place] [phone] (After Hours)        Chronic Pain Chronic pain can be defined as pain that is lasting, off and on, and lasts for 3 to 6 months or longer. Many things cause chronic pain, which can make it difficult to make a discrete diagnosis. There are many treatment options available for chronic pain. However, finding a treatment that works well for you may require trying various approaches until a suitable one is found. CAUSES  In some types of chronic medical conditions, the pain is caused by a normal pain response within the body. A normal pain response helps the body identify illness or injury and prevent further damage from being done. In these cases, the cause of the pain may be identified and treated, even if it may not be cured completely. Examples of chronic conditions which can cause chronic pain include:  Inflammation of the joints (arthritis).   Back pain or neck pain (including bulging or herniated disks).   Migraine headaches.   Cancer.  In some other types of chronic pain syndromes, the pain is caused by an abnormal pain response within the body. An abnormal pain response is present when there is no ongoing cause (or stimulus) for the pain, or when the cause of the pain is arising from the nerves or nervous system itself. Examples of conditions which can cause chronic pain due to an  abnormal pain response include:  Fibromyalgia.   Reflex sympathetic dystrophy (RSD).   Neuropathy (when the nerves themselves are damaged, and may cause pain).  DIAGNOSIS  Your caregiver will help diagnose your condition over time. In many cases, the initial focus will be on excluding conditions that could be causing the pain. Depending on your symptoms, your caregiver may order some tests to diagnose your condition. Some of these tests include:  Blood tests.   Computerized X-ray scans (CT scan).   Computerized magnetic scans (MRI).   X-rays.   Ultrasounds.   Nerve conduction studies.   Consultation with other physicians or specialists.  TREATMENT  There are many treatment options for people suffering from chronic pain. Finding a treatment that works well may take time.   You may be referred to a pain management specialist.   You may be put on medication to help with the pain. Unfortunately, some medications (such as opiate medications) may not be very effective in cases where chronic pain is due to abnormal pain responses. Finding the right medications can take some time.   Adjunctive therapies may be used to provide additional relief and improve a patient's quality of life. These therapies include:   Mindfulness meditation.   Acupuncture.   Biofeedback.   Cognitive-behavioral therapy.   In certain cases, surgical interventions may be attempted.  HOME CARE INSTRUCTIONS   Make sure you understand these instructions prior to discharge.   Ask any questions and share any further concerns you have with your caregiver prior to discharge.   Take all medications as directed by your caregiver.   Keep all follow-up appointments.  SEEK MEDICAL CARE IF:   Your pain gets worse.   You develop a new pain that was not present before.   You cannot tolerate any medications prescribed by your caregiver.   You develop new symptoms since your  last visit with your caregiver.    SEEK IMMEDIATE MEDICAL CARE IF:   You develop muscular weakness.   You have decreased sensation or numbness.   You lose control of bowel or bladder function.   Your pain suddenly gets much worse.   You have an oral temperature above 102 F (38.9 C), not controlled by medication.   You develop shaking chills, confusion, chest pain, or shortness of breath.  Document Released: 08/13/2002 Document Revised: 08/03/2011 Document Reviewed: 11/19/2008 [facility] Patient Information 2012 [place].

## 2012-01-24 NOTE — ED Notes (Signed)
Pt is upset pt states he does not want any scans done. Pt states he has a scan completed last tues. Pt states he is upset. Pt very snappy with staff.

## 2012-01-30 ENCOUNTER — Telehealth: Payer: Self-pay | Admitting: Emergency Medicine

## 2012-01-30 NOTE — Telephone Encounter (Signed)
ERROR

## 2012-01-31 ENCOUNTER — Telehealth: Payer: Self-pay | Admitting: Emergency Medicine

## 2012-01-31 NOTE — Telephone Encounter (Signed)
S/W PT. ABOUT A [facility] THEY WILL ACCEPT HIS ORANGE CARD INS.

## 2012-02-01 ENCOUNTER — Telehealth: Payer: Self-pay | Admitting: Emergency Medicine

## 2012-02-01 NOTE — Telephone Encounter (Signed)
S/W [employer], RN AT [facility] TO SCHEDULE PT TO BE SEEN IN REF. TO PAIN MANAGEMENT.  BOTH PAIN MANAGEMENT FACILITIES WE HAVE CONTACTED EITHER DO NO ACCEPT HIS INS. (ORANGE CARD) OR DO NOT TREAT THE AREA IN QUESTION.  THE PT. WILL BE SEEN 02-01-12 AT 1100AM BY DR Jeanella Cara.   FAXED RECORDS TO [facility], [place] FAX # [phone]

## 2012-02-02 ENCOUNTER — Encounter (HOSPITAL_COMMUNITY): Payer: Self-pay | Admitting: Emergency Medicine

## 2012-02-02 ENCOUNTER — Emergency Department (HOSPITAL_COMMUNITY)
Admission: EM | Admit: 2012-02-02 | Discharge: 2012-02-02 | Disposition: A | Discharge status: Home Health | Payer: Self-pay | Attending: Emergency Medicine | Admitting: Emergency Medicine

## 2012-02-02 ENCOUNTER — Other Ambulatory Visit: Payer: Self-pay

## 2012-02-02 DIAGNOSIS — R1013 Epigastric pain: Secondary | ICD-10-CM | POA: Insufficient documentation

## 2012-02-02 DIAGNOSIS — Z794 Long term (current) use of insulin: Secondary | ICD-10-CM | POA: Insufficient documentation

## 2012-02-02 DIAGNOSIS — E119 Type 2 diabetes mellitus without complications: Secondary | ICD-10-CM | POA: Insufficient documentation

## 2012-02-02 DIAGNOSIS — R079 Chest pain, unspecified: Secondary | ICD-10-CM | POA: Insufficient documentation

## 2012-02-02 DIAGNOSIS — I10 Essential (primary) hypertension: Secondary | ICD-10-CM | POA: Insufficient documentation

## 2012-02-02 LAB — CBC
HCT: 37.1 % — ABNORMAL LOW (ref 39.0–52.0)
MCH: 30.9 pg (ref 26.0–34.0)
MCV: 83.6 fL (ref 78.0–100.0)
RBC: 4.44 MIL/uL (ref 4.22–5.81)
RDW: 11.7 % (ref 11.5–15.5)
WBC: 4.6 10*3/uL (ref 4.0–10.5)

## 2012-02-02 LAB — BASIC METABOLIC PANEL
BUN: 9 mg/dL (ref 6–23)
CO2: 29 mEq/L (ref 19–32)
Calcium: 10 mg/dL (ref 8.4–10.5)
Chloride: 104 mEq/L (ref 96–112)
Creatinine, Ser: 0.92 mg/dL (ref 0.50–1.35)

## 2012-02-02 MED ORDER — HYDROMORPHONE HCL PF 2 MG/ML IJ SOLN
2.0000 mg | Freq: Once | INTRAMUSCULAR | Status: AC
  Administered 2012-02-02: 2 mg via INTRAMUSCULAR
  Filled 2012-02-02: qty 1

## 2012-02-02 NOTE — ED Notes (Signed)
Patient is AOx4 and comfortable with his discharge instructions.  He is being driven home by fiance'.

## 2012-02-02 NOTE — Discharge Instructions (Signed)

## 2012-02-02 NOTE — ED Notes (Addendum)
Pt st's he has been having chest pain x's 2 weeks.  Pain radiates from mid chest to epigastric area.  St's at times pain radiates into RUQ.  Pt st's is out of dilaudid.

## 2012-02-02 NOTE — ED Provider Notes (Signed)
History     CSN: [id number]  Arrival date & time 02/02/12  1945   First MD Initiated Contact with Patient 02/02/12 2306      Chief Complaint  Patient presents with  . Chest Pain     The history is provided by the patient and medical records.   the patient reports several weeks of constant epigastric discomfort and tightness without nausea or vomiting.  He reports she has a history of renal cell carcinoma status post ablation has had an ongoing chronic flank and abdominal pain since the procedure was performed in December of 2012.  He's currently being treated with Dilaudid and Vicodin at home for pain however he reports he was recently discharged from his 55 care and is no longer able to obtain Dilaudid for his pain.  He denies nausea vomiting.  He denies fevers or chills.  He denies shortness of breath or diaphoresis.  Nothing worsens the symptoms.  Nothing improves his symptoms.  Symptoms are constant.  He was seen in the ER by my partner 9 days ago at that time was informed that the ER doctor best place to treat chronic pain however he was given a short course of 4 mg Dilaudid tablets in order hold him over until he can followup with a new primary care Dr.  The patient reports he is now finished with these and he is back having pain again.  Past Medical History  Diagnosis Date  . Hypertension   . Diabetes mellitus     DKA sept admission  . Diverticulitis     s/p micorperforation Sept 2012-managed conservatively by Gen surgery  . Kidney tumor     Renal cell CA    Past Surgical History  Procedure Date  . Kidney surgery     last one 12/28, prior one was October 2012-Dr. Kathlene Cote did the surgery.    Family History  Problem Relation Age of Onset  . Diabetes Father     History  Substance Use Topics  . Smoking status: Never Smoker   . Smokeless tobacco: Never Used  . Alcohol Use: No      Review of Systems  Cardiovascular: Positive for chest pain.  All other  systems reviewed and are negative.    Allergies  Neurontin  Home Medications   Current Outpatient Rx  Name Route Sig Dispense Refill  . INSULIN ISOPHANE & REGULAR (70-30) 100 UNIT/ML [place] SUSP Subcutaneous Inject 40 Units into the skin 2 (two) times daily with a meal.     . TELMISARTAN 80 MG PO TABS Oral Take 80 mg by mouth daily.    [name] HYDROMORPHONE HCL 4 MG PO TABS Oral Take 4 mg by mouth every 4 (four) hours as needed. pain    . HYDROMORPHONE HCL 4 MG PO TABS Oral Take 1 tablet (4 mg total) by mouth every 4 (four) hours as needed for pain. 30 tablet 0    BP 162/91  Pulse 88  Temp(Src) 98.5 F (36.9 C) (Oral)  Resp 17  SpO2 98%  Physical Exam  Nursing note and vitals reviewed. Constitutional: He is oriented to person, place, and time. He appears well-developed and well-nourished.  HENT:  Head: Normocephalic and atraumatic.  Eyes: EOM are normal.  Neck: Normal range of motion.  Cardiovascular: Normal rate, regular rhythm, normal heart sounds and intact distal pulses.   Pulmonary/Chest: Effort normal and breath sounds normal. No respiratory distress.  Abdominal: Soft. He exhibits no distension. There is no tenderness.  Musculoskeletal:  Normal range of motion.  Neurological: He is alert and oriented to person, place, and time.  Skin: Skin is warm and dry.  Psychiatric: He has a normal mood and affect. Judgment normal.    ED Course  Procedures (including critical care time)   Date: 02/02/2012  Rate: 86  Rhythm: normal sinus rhythm  QRS Axis: normal  Intervals: normal  ST/T Wave abnormalities: nonspecific ST and T wave changes  Conduction Disutrbances: none  Narrative Interpretation:   Old EKG Reviewed: No significant changes noted     Labs Reviewed  CBC - Abnormal; Notable for the following:    HCT 37.1 (*)    MCHC 36.9 (*)    All other components within normal limits  BASIC METABOLIC PANEL - Abnormal; Notable for the following:    Potassium 3.4 (*)     Glucose, Bld 118 (*)    All other components within normal limits   No results found.   1. Abdominal pain       MDM  This appears to be more chronic abdominal pain.  Labs normal.  EKG normal.  The patient was recently prescribed Dilaudid by my partner 9 days ago.  He needs a chronic pain specialist to follow his discomfort.  Abdomen is benign on exam.  This is not ACS        Hoy Morn, MD 02/02/12 2329

## 2012-03-14 ENCOUNTER — Ambulatory Visit (INDEPENDENT_AMBULATORY_CARE_PROVIDER_SITE_OTHER): Payer: BC Managed Care – PPO | Admitting: Medical

## 2012-03-14 ENCOUNTER — Encounter: Payer: Self-pay | Admitting: Medical

## 2012-03-14 VITALS — BP 140/90 | HR 68 | Temp 98.1°F | Resp 16 | Ht 71.0 in | Wt 242.0 lb

## 2012-03-14 DIAGNOSIS — R109 Unspecified abdominal pain: Secondary | ICD-10-CM

## 2012-03-14 DIAGNOSIS — I10 Essential (primary) hypertension: Secondary | ICD-10-CM

## 2012-03-14 DIAGNOSIS — M549 Dorsalgia, unspecified: Secondary | ICD-10-CM

## 2012-03-14 DIAGNOSIS — C649 Malignant neoplasm of unspecified kidney, except renal pelvis: Secondary | ICD-10-CM

## 2012-03-14 MED ORDER — HYDROCODONE-ACETAMINOPHEN 5-500 MG PO TABS: 1.0000 | ORAL_TABLET | Freq: Three times a day (TID) | ORAL | Status: AC | PRN

## 2012-03-14 MED ORDER — DICLOFENAC SODIUM 75 MG PO TBEC: 75.0000 mg | DELAYED_RELEASE_TABLET | Freq: Two times a day (BID) | ORAL | Status: AC

## 2012-03-14 NOTE — Progress Notes (Signed)
Subjective:   HPI  Brandon [name] is a 51 y.o. male who presents as a new patient today with his wife.  He has formerly been followed by [facility] for medical care.   He is here to establish care.    His first c/o is chronic pain in his low back.  He was seen in the ED back in October 2012 for diverticulitis, but on CT scan of abdomen, he had renal masses later found to be bilat renal cell carcinoma.  These were ablated, one in October and one in December 2012.  Since the December ablation, he notes later on in the evening after surgery that he had pain in his low back and abdomen.  This pain has persisted without relief, interferes with sleep daily, and he can't get relief.  He was told that the ablation may have done damage to a nerve causing the pain.  He was initially prescribed Gabapentin but says this makes his very bloated in his abdomen, doesn't help the pain, and doesn't want to keep taking it.  He was prescribed Ultram 50mg  and this doesn't help and causes cramping in his abdomen.  He wants something different to help the pain.  He is already using Ibuprofen 800mg  2-3 times daily for the pain.    He was referred to pain clinic initially by [facility], and he was told that the pain specialist would help treat the pain, but he says referral was never done by [facility].  He ended up going back to the interventional radiologist and was referred to different pain clinic.  He went recently for the initial appt but the physician was out that day, and he wasn't seen by a provider although he had been schedule.  Thus, he is asking for something to relieve the pain as currently therapies aren't helping.  He does have an initial appt with oncology in May.  He was being followed primarily by interventional radiology for the renal cell treatment.    He know he also needs help with his diabetes and blood pressure treatment.   Last hgbA1c was over 10% few months ago.    No other c/o.  The  following portions of the patient's history were reviewed and updated as appropriate: allergies, current medications, past family history, past medical history, past social history, past surgical history and problem list.  Past Medical History  Diagnosis Date  . Hypertension   . Diabetes mellitus     DKA sept admission  . Diverticulitis     s/p micorperforation Sept 2012-managed conservatively by Gen surgery  . Kidney tumor 09/2011    Renal cell CA  . Wears glasses   . ED (erectile dysfunction)     No Active Allergies   Review of Systems Gen: no fever, chills, weight changes Skin: no rash Heent: negative Lungs: no sob, wheezing Heart: no chest pain, palpitations, edema GI: +intermittent pain, no NVD GU: negative Neuro: no headache, numbness, tingling Back: low back pain + ROS reviewed and was negative other than noted in HPI or above.    Objective:   Physical Exam  General appearance: alert, no distress, WD/WN, overweight black male Oral cavity: MMM, no lesions, few teeth in decay, rest of teeth in relatively good repair Neck: supple, no lymphadenopathy, no thyromegaly, no masses, non tender Heart: RRR, normal S1, S2, no murmurs Lungs: CTA bilaterally, no wheezes, rhonchi, or rales Abdomen: +bs, soft, tender upper abdomen throughout, non distended, no masses, no hepatomegaly, no splenomegaly Pulses:  2+ symmetric Back: tender right midline and paraspinal lumbar region, pain with ROM, flexion to about 70 degrees, extension limited by pain MSK: LE nontender, hip internal ROM reduced, otherwise LE unremarkable Neuro: normal DTRs and strength of LE   Assessment and Plan :     Encounter Diagnoses  Name Primary?  . Back pain Yes  . Abdominal pain   . Renal cell carcinoma   . Essential hypertension, benign   . Type II or unspecified type diabetes mellitus without mention of complication, uncontrolled    Back and abdomina pain - etiology unclear.  I will review EMR  record for CT and MR imaging.  Advise that although its ok to see the pain clinic, we need to find out exactly what is causing the pain as this isn't a longstanding problem but started after his surgery in 12/12.  Scripts today for Diclofenac and short term Lortab for pain  Renal cell carcinoma - he has appt with oncology for f/u next month  HTN - recheck in 63mo  DM type II - per his report, sounds like he is not at goal.   Will get records from [facility] and recheck in 40mo

## 2012-03-15 ENCOUNTER — Encounter: Payer: Self-pay | Admitting: Medical

## 2012-03-15 DIAGNOSIS — IMO0001 Reserved for inherently not codable concepts without codable children: Secondary | ICD-10-CM | POA: Insufficient documentation

## 2013-01-08 ENCOUNTER — Other Ambulatory Visit (HOSPITAL_COMMUNITY): Payer: Self-pay | Admitting: Interventional Radiology

## 2013-01-08 DIAGNOSIS — C649 Malignant neoplasm of unspecified kidney, except renal pelvis: Secondary | ICD-10-CM

## 2013-02-13 ENCOUNTER — Encounter: Payer: Self-pay | Admitting: Radiology

## 2013-03-28 ENCOUNTER — Telehealth: Payer: Self-pay | Admitting: Emergency Medicine

## 2013-03-28 NOTE — Telephone Encounter (Signed)
CALLED PT TO SET UP F/U APPT W/ [name].  PT WILL CALL us BACK WHEN HE RECEIVED HIS [employer] INS. CARD.

## 2013-04-24 ENCOUNTER — Telehealth: Payer: Self-pay | Admitting: Radiology

## 2013-04-24 NOTE — Telephone Encounter (Signed)
Left messages on both contact numbers requesting patient to call to schedule f/u appointment w/ [name].   Cletus Paris Riki Rusk, Therapist, sports

## 2013-05-09 ENCOUNTER — Other Ambulatory Visit: Payer: Self-pay | Admitting: Emergency Medicine

## 2013-05-09 DIAGNOSIS — C649 Malignant neoplasm of unspecified kidney, except renal pelvis: Secondary | ICD-10-CM

## 2013-05-22 ENCOUNTER — Ambulatory Visit
Admission: RE | Admit: 2013-05-22 | Discharge: 2013-05-22 | Disposition: A | Discharge status: Home / Self Care | Payer: BC Managed Care – PPO | Source: Ambulatory Visit | Attending: Interventional Radiology | Admitting: Interventional Radiology

## 2013-05-22 ENCOUNTER — Encounter (HOSPITAL_COMMUNITY): Payer: Self-pay

## 2013-05-22 ENCOUNTER — Ambulatory Visit (HOSPITAL_COMMUNITY)
Admission: RE | Admit: 2013-05-22 | Discharge: 2013-05-22 | Disposition: A | Discharge status: Home / Self Care | Payer: BC Managed Care – PPO | Source: Ambulatory Visit | Attending: Interventional Radiology | Admitting: Interventional Radiology

## 2013-05-22 VITALS — BP 144/83 | HR 77 | Temp 98.3°F | Resp 17 | Ht 73.5 in | Wt 280.0 lb

## 2013-05-22 DIAGNOSIS — R918 Other nonspecific abnormal finding of lung field: Secondary | ICD-10-CM | POA: Insufficient documentation

## 2013-05-22 DIAGNOSIS — C649 Malignant neoplasm of unspecified kidney, except renal pelvis: Secondary | ICD-10-CM

## 2013-05-22 DIAGNOSIS — IMO0002 Reserved for concepts with insufficient information to code with codable children: Secondary | ICD-10-CM | POA: Insufficient documentation

## 2013-05-22 HISTORY — DX: Malignant (primary) neoplasm, unspecified: C80.1

## 2013-05-22 LAB — CREATININE WITH EST GFR: GFR, Est Non African American: 86 mL/min

## 2013-05-22 MED ORDER — IOHEXOL 300 MG/ML  SOLN
100.0000 mL | Freq: Once | INTRAMUSCULAR | Status: AC | PRN
  Administered 2013-05-22: 100 mL via INTRAVENOUS

## 2013-05-22 NOTE — Progress Notes (Signed)
Patient ID: Brandon [name], male   DOB: 08-28-1961, 52 y.o.   MRN: [id number]  ESTABLISHED PATIENT OFFICE VISIT  Chief Complaint: Status post percutaneous cryoablation of bilateral renal carcinomas.  History: Brandon [name] has status post cryoablation of a right- sided biopsy proven renal cell carcinoma on 09/30/2011 and percutaneous cryoablation of a left-sided renal carcinoma on 12/02/2011. The first procedure was completely uncomplicated. The second procedure was complicated by significant postprocedural pain for which the patient was treated with long-term pain medication and eventually referred to Dr. Mohammed Kindle for pain management. The patient saw Dr. Primus Bravo approximately three times with the last visit about 6 months ago. He was placed on pain patches as well as Lyrica. The patient felt that Lyrica did help significantly. The patient lost his job and insurance approximately 6 months ago and is now out of medicine. He has recently obtained insurance through [employer]. He is planning to followup again with Dr. Primus Bravo. He states that he still does have left lateral abdominal pain radiating into the epigastric region which is constant. He denies any nausea or vomiting. He has had no urinary symptoms.  Review of Systems: No fever or chills. No hematuria or dysuria. No weakness.  Exam: Vital signs: Blood pressure 144/83, pulse 77, respirations 17, temperature 98.3, oxygen saturation 99% on room air. General: No acute distress. Abdomen: Soft and nontender to palpation. No flank tenderness is elicited.  Labs: BUN 18, creatinine 1.01 and estimated GFR greater than 89 ml/minute on 05/21/2013.  Imaging: Follow-up CT of the abdomen was performed with and without contrast earlier today and demonstrates excellent appearance post ablation of bilateral renal carcinomas with further retraction of ablation scar tissue bilaterally and no evidence of abnormal enhancement in either kidney to  suggest recurrent or residual carcinoma. There is no evidence of complication by imaging.  Assessment and Plan: Excellent appearance status post previous percutaneous cryoablation of bilateral renal carcinomas. The patient is now roughly 18 months post-treatment of the two lesions. I have recommended continued follow-up for at least 5 years post- treatment. I have recommended a CT scan in 12 to 18 months. In the meantime, the patient plans to follow up with Dr. Primus Bravo and also would like to obtain employment again.

## 2013-05-22 NOTE — Progress Notes (Signed)
Denies hematuria or any other problems w/ urination.  Continues to c/o Left flank pain.  Recently obtained insurance coverage & plans to return to pain management clinic.  Sheina Mcleish Riki Rusk, RN 05/22/2013 9:10 AM

## 2013-06-27 ENCOUNTER — Encounter: Payer: Self-pay | Admitting: Family Medicine

## 2013-06-27 ENCOUNTER — Ambulatory Visit (INDEPENDENT_AMBULATORY_CARE_PROVIDER_SITE_OTHER): Payer: BC Managed Care – PPO | Admitting: Family Medicine

## 2013-06-27 VITALS — BP 150/104 | HR 72 | Temp 98.4°F | Ht 71.25 in | Wt 286.0 lb

## 2013-06-27 DIAGNOSIS — J029 Acute pharyngitis, unspecified: Secondary | ICD-10-CM

## 2013-06-27 DIAGNOSIS — C649 Malignant neoplasm of unspecified kidney, except renal pelvis: Secondary | ICD-10-CM

## 2013-06-27 DIAGNOSIS — J019 Acute sinusitis, unspecified: Secondary | ICD-10-CM

## 2013-06-27 DIAGNOSIS — G894 Chronic pain syndrome: Secondary | ICD-10-CM

## 2013-06-27 DIAGNOSIS — I10 Essential (primary) hypertension: Secondary | ICD-10-CM

## 2013-06-27 MED ORDER — TELMISARTAN 80 MG PO TABS: 80.0000 mg | ORAL_TABLET | Freq: Every day | ORAL | Status: DC

## 2013-06-27 MED ORDER — PREGABALIN 75 MG PO CAPS: 75.0000 mg | ORAL_CAPSULE | Freq: Two times a day (BID) | ORAL | Status: DC

## 2013-06-27 MED ORDER — AMOXICILLIN 875 MG PO TABS: 875.0000 mg | ORAL_TABLET | Freq: Two times a day (BID) | ORAL | Status: DC

## 2013-06-27 NOTE — Patient Instructions (Addendum)
Guaifenesin as needed to loosen phlegm Avoid decongestants which can raise blood pressure Avoid ibuprofen and other anti-inflammatories due to your kidney issues.  Take the antibiotics twice daily for 10 days. Restart the micardis for your blood pressure  See [name] within 2-3 weeks for all your other chronic issues, and to recheck your blood pressure

## 2013-06-27 NOTE — Progress Notes (Signed)
Chief Complaint  Patient presents with  . Otalgia    left ear only x 1 week. Has noticed some drainage and his throat hurts. Had kidney cancer and had some nerve damage. Thinks h may have taken an OTC sinus medication that may have had ibuprofen in it, his left kidney is swollen and he is a lot of pain.    Started having some L ear plugging 6-7 days ago. He used some peroxide to try and clean it out, but it didn't help.  Then started having some head congestion and sore throat, which is worse at night.  Last night his throat was very raw, scratchy, and looked swollen in the mirror this morning.  Nasal stuffiness (no drainage), postnasal drainage, very thick which causes coughing spells.  Cough drops help some.  Phlegm is greenish yellow.  Ear feels clogged, slight L sided headache  No sick contacts.  He tried alka selzer sinus medication, and another OTC medication that had ibuprofen.  He states that pain doctor told him not to take tylenol, as it seems to have made his nerve pain worse.  No longer seeing pain management doctor due to insurance reasons.  Pain has been under better control with Lyrica, but he needs more, requesting samples.  Having some increased pain L flank since sick, wondering if related to NSAIDs he has taken.  He has chronic pain in L back since kidney surgery, felt to have nerve damage that recently flared up some.  HTN--he has been off meds, due to losing insurance when he lost his job.  He now has obamacare.  He previously took Micardis without side effects or problems.  He hasn't seen his PCP, [name], in over a year  Past Medical History  Diagnosis Date  . Hypertension   . Diabetes mellitus     DKA sept admission  . Diverticulitis     s/p micorperforation Sept 2012-managed conservatively by Gen surgery  . Kidney tumor 09/2011    Renal cell CA  . Wears glasses   . ED (erectile dysfunction)   . Bil Renal Ca dx'd 09/2011 & 11/2011    left and right; cryoablation bil    Past Surgical History  Procedure Laterality Date  . Kidney surgery      ablation of renal cell CA - 12/28, prior one was October 2012-Dr. Kathlene Cote   History   Social History  . Marital Status: Single    Spouse Name: N/A    Number of Children: N/A  . Years of Education: N/A   Occupational History  . Not on file.   Social History Main Topics  . Smoking status: Never Smoker   . Smokeless tobacco: Never Used  . Alcohol Use: No  . Drug Use: No  . Sexually Active: No   Other Topics Concern  . Not on file   Social History Narrative  . No narrative on file    Current outpatient prescriptions:insulin NPH-insulin regular (NOVOLIN 70/30) (70-30) 100 UNIT/ML injection, Inject 40 Units into the skin 2 (two) times daily with a meal. , Disp: , Rfl: ;  pregabalin (LYRICA) 75 MG capsule, Take 75 mg by mouth 2 (two) times daily., Disp: , Rfl:   No Known Allergies  ROS:  Denies fevers, chills, nausea, vomiting, diarrhea, skin rash, chest pain, dizziness, palpitations, skin rash.  See HPI.  +L flank pain.  No urinary complaints.   PHYSICAL EXAM: BP 150/104  Pulse 72  Temp(Src) 98.4 F (36.9 C) (Oral)  Ht  5' 11.25" (1.81 m)  Wt 286 lb (129.729 kg)  BMI 39.6 kg/m2 Well developed, pleasant male in no distress HEENT:  PERRL, conjunctiva clear.  TM's and EAC's--normal on right.  L had slight diffuse light reflex, but no erythema or other abnormality.  Nasal mucosa moderately edematous, +erythema.  White drainage.  No sinus tenderness.  OP with erythema posteriorly Neck: no lymphadenopathy Heart: regular rate and rhythm without murmur Lungs: clear bilaterally Back: no spine tenderness. No muscle spasm.  Mild tenderness over L CVA  ASSESSMENT/PLAN:  Renal cell carcinoma, unspecified laterality  Essential hypertension, benign - Plan: telmisartan (MICARDIS) 80 MG tablet  Acute sinusitis - Plan: amoxicillin (AMOXIL) 875 MG tablet  Acute pharyngitis - Plan: amoxicillin (AMOXIL) 875 MG  tablet  Chronic pain syndrome  Otalgia, left--no evidence of AOM.    HTN--off meds since he lost insurance.  Recent Cr okay.  Restart micardis.  Return in 2-3 weeks to f/u with [name] (who hasn't seen him in over a year)    Guaifenesin as needed to loosen phlegm Avoid decongestants which can raise blood pressure Avoid ibuprofen and other anti-inflammatories due to your kidney issues.

## 2013-07-15 ENCOUNTER — Encounter: Payer: BC Managed Care – PPO | Admitting: Medical

## 2013-07-17 ENCOUNTER — Ambulatory Visit (INDEPENDENT_AMBULATORY_CARE_PROVIDER_SITE_OTHER): Payer: BC Managed Care – PPO | Admitting: Medical

## 2013-07-17 ENCOUNTER — Encounter: Payer: Self-pay | Admitting: Medical

## 2013-07-17 VITALS — BP 132/90 | HR 78 | Temp 97.7°F | Resp 16 | Wt 283.0 lb

## 2013-07-17 DIAGNOSIS — I10 Essential (primary) hypertension: Secondary | ICD-10-CM

## 2013-07-17 DIAGNOSIS — Z9119 Patient's noncompliance with other medical treatment and regimen: Secondary | ICD-10-CM

## 2013-07-17 DIAGNOSIS — E1142 Type 2 diabetes mellitus with diabetic polyneuropathy: Secondary | ICD-10-CM

## 2013-07-17 DIAGNOSIS — L608 Other nail disorders: Secondary | ICD-10-CM

## 2013-07-17 DIAGNOSIS — E669 Obesity, unspecified: Secondary | ICD-10-CM

## 2013-07-17 DIAGNOSIS — L602 Onychogryphosis: Secondary | ICD-10-CM

## 2013-07-17 DIAGNOSIS — E1140 Type 2 diabetes mellitus with diabetic neuropathy, unspecified: Secondary | ICD-10-CM

## 2013-07-17 DIAGNOSIS — G8929 Other chronic pain: Secondary | ICD-10-CM

## 2013-07-17 DIAGNOSIS — E1149 Type 2 diabetes mellitus with other diabetic neurological complication: Secondary | ICD-10-CM

## 2013-07-17 DIAGNOSIS — B353 Tinea pedis: Secondary | ICD-10-CM

## 2013-07-17 LAB — CBC WITH DIFFERENTIAL/PLATELET
Basophils Absolute: 0 10*3/uL (ref 0.0–0.1)
Eosinophils Relative: 3 % (ref 0–5)
Lymphocytes Relative: 35 % (ref 12–46)
Neutro Abs: 3.4 10*3/uL (ref 1.7–7.7)
Neutrophils Relative %: 55 % (ref 43–77)
Platelets: 249 10*3/uL (ref 150–400)
RDW: 13.4 % (ref 11.5–15.5)
WBC: 6.1 10*3/uL (ref 4.0–10.5)

## 2013-07-17 LAB — POCT GLYCOSYLATED HEMOGLOBIN (HGB A1C): Hemoglobin A1C: 12.8

## 2013-07-17 NOTE — Addendum Note (Signed)
Addended by: Christin Bach L on: 07/17/2013 11:07 AM   Modules accepted: Orders

## 2013-07-17 NOTE — Progress Notes (Addendum)
Subjective:    Brandon [name] is a 52 y.o. male who presents for follow-up of Type 2 diabetes mellitus, HTN, routine f/u.  I last saw him 03/2012 as a new patient that day.     Medical team: Pain management - Dr. Cory Munch Surgical Oncology - Dr. Gerre Pebbles, just saw 5-6 weeks  Home blood sugar records.  Checking once daily, getting 198-230 fasting.  Current symptoms/problems include urinating alot and have been improving. Daily foot checks: yes   Any foot concerns: none Last eye exam:  2013, over a year ago.  Can't recall eye doctor's name. Saw diabetes education counselor this past year.   Diagnosed with diabetse 2009.   Medication compliance: Current diet: well balanced, trying to eat healthy, lot of vegetables, cut back on fried foods. Current exercise: walking about every day to every other day Known diabetic complications: nephropathy Cardiovascular risk factors: diabetes mellitus and hypertension  Getting his insulin OTC at [facility].  Using 40 units BID of 70/30.  In the past was on metformin originally, but this dehydrated him a lot.    Here for follow-up of hypertension.  male is exercising and is adherent to a low-salt diet.  Blood pressure is not well controlled at home.  Just started back on Micardis a month ago.  In the past has also been on HCTZ.  Cardiac symptoms: none. Patient denies: chest pain, dyspnea, fatigue and lower extremity edema. Cardiovascular risk factors: diabetes mellitus, hypertension, male gender, obesity (BMI >= 30 kg/m2) and sedentary lifestyle.   No prior hx/o medication for high cholesterol.   ED - has had issues with getting erections since his surgery in 2012 for renal cell carcinoma.   Currently not seeing any quality erections.   Unable to penetrate with intercourse.   energy level is ok, sex drive is ok, but can't maintain erections.   The following portions of the patient's history were reviewed and updated as appropriate: allergies,  current medications, past family history, past medical history, past social history, past surgical history and problem list.  ROS as in subjective above    Objective:     Filed Vitals:   07/17/13 0809  BP: 132/90  Pulse: 78  Temp: 97.7 F (36.5 C)  Resp: 16    General appearance: alert, no distress, WD/WN, obese AA male Neck: supple, no lymphadenopathy, no thyromegaly, no masses Heart: RRR, normal S1, S2, no murmurs Lungs: CTA bilaterally, no wheezes, rhonchi, or rales Abdomen: +bs, soft, non tender, non distended, no masses, no hepatomegaly, no splenomegaly Pulses: 2+ symmetric, upper and lower extremities, normal cap refill Ext: no edema Foot exam: see separate foot exam       Assessment:   Encounter Diagnoses  Name Primary?  . Type II or unspecified type diabetes mellitus without mention of complication, uncontrolled Yes  . Essential hypertension, benign   . Chronic pain   . Obesity, unspecified   . Diabetic neuropathy   . Hypertrophic toenail   . Tinea pedis   . Noncompliance      Plan:    We had a long talk today about his care.  unfortunately I last saw him 03/2012 as a new patient.  This is the first time he has been back in f/u for his chronic issues.  He did lose insurance this past year which contributed to his lack of follow up.  Nevertheless, his BP and diabetes are poorly controlled. He has peripheral neuropathy, ED, and has hx/o renal cell carcinoma.  His HgbA1C was 12.8% today.   We will check fasting labs.  I advised that once we review labs, we will call back with medication changes including modifications of his diabetes and hypertension medications.   discussed diet, exercise, getting eye doctor visit, getting flu shot, being compliant.   F/u pending labs. Handout given.

## 2013-07-17 NOTE — Patient Instructions (Addendum)
Thank you for giving me the opportunity to serve you today.    Your diagnosis today includes: Encounter Diagnoses  Name Primary?  . Type II or unspecified type diabetes mellitus without mention of complication, uncontrolled Yes  . Essential hypertension, benign   . Chronic pain   . Obesity, unspecified   . Diabetic neuropathy   . Hypertrophic toenail   . Tinea pedis   . Noncompliance     Specific recommendations today include:  Don't wait a whole year to come in for diabetes followup.  Your diabetes is poorly controlled, and to help manage your diabetes, you need routine follow up every 3 months until we have better control over your sugars  Check your sugars 2-3 times daily before meals  work on eating a healthy diet, continue walking for exercise, and be compliant with your medications  See eye doctor soon for yearly diabetic screening and vision exam  Don't stop your blood pressure medication.  Pending labs, I will probably add a medication onto your current Micardis for better blood pressure control  We are referring you to podiatry for diabetic foot care  Follow up with your pain management doctor   Pending labs, we can discuss other needed changes  Diabetes medications:    Follow up: in 1 month   I have included other useful information below for your review.   To help you manage your diabetes, I have included some basic information about diabetes, guidelines, and goals.  Type 2 Diabetes  Diabetes is a long-lasting (chronic) disease.  With diabetes, either the pancreas does not make enough of a hormone called insulin, or the body has trouble using the insulin that is made.  Over time, diabetes can damage the eyes, kidneys, and nerves causing retinopathy, nephropathy, and neuropathy.  Diabetes puts you at risk for heart disease and peripheral vascular disease which can lead to heart attack, stroke, foot ulcers, and amputations.    Our goal and hopefully your  goal is to manage your diabetes in such a way to slow the progression of the disease and do all we can to keep you healthy  Home Care:   Eat healthy, exercise regularly, limit alcohol, and don't smoke!  Check your blood sugar (glucose) once a day before breakfast, or as indicated by our discussion today.  Take your medications daily, don't run out of medications.  Learn about low blood sugar (hypoglycemia). Know how to treat it.  Wear a necklace or bracelet that says you have diabetes.  Check your feet every night for cuts, sores, blisters, and redness. Tell your medical provider if you have problems.  Maintain a normal body weight, or normal BMI - height to weight ratio of 20-25.  Ask me about this.  GET HELP RIGHT AWAY IF:  You have trouble keeping your blood sugar in target range.  You have problems with your medicines.  You are sick and not getting better after 24 hours.  You have a sore or wound that is not healing.  You have vision problems or changes.  You have a fever.  Diet: I can provide you with additional information on this. Here are some basics: For your body to process sugar properly and to help maintain a healthy weight, eat 4-5 times daily in small portions instead of 1-2 large meals.  Do's:   whole grains such as whole grain pasta, rice, whole grains breads and whole grain cereals.  Use small quantities such as 1/2 cup per serving  or 2 slices of bread per serving.    Eat 3-5 fruits daily  Eat beans at least once daily  Eat almonds in small quantities at least 3 days per week    If you eat meat, eat small portions (3-4oz) of lean meats such as chicken, fish, and [place].  Eat as much NON corn and NON potato vegetables as you like, particularly raw or steamed  Drink several large glasses of water daily  Cautions:  Limit red meat  Limit corn and potatoes  Limit sweets, cake, pie, candy  Limit beer and alcohol  Avoid fried food, fast food,  large portions  Avoid sugary drinks such as regular soda and sweet tea  Avoid diet soda, diet drinks   Exercise regularly since it has beneficial effects on the heart and blood sugars. Exercising at least three times per week can be as important as medication to a diabetic.  Find some form of exercise that you will enjoy doing regularly.  This can include walking, biking, kayaking, golfing, swimming, dance, aerobics, hiking, etc.  If you have joint problems, many local gyms have equipment to accommodate people with specific needs.    Vaccinations:  Diabetics are at increased risk for infection, and illnesses can take longer to resolve.  Current vaccine recommendations include yearly Influenza (flu) vaccine (recommended in October), Pneumococcal vaccine, Hepatitis B vaccine series, Tdap (tetanus, diptheria and pertussis) vaccine every 10 years, and other age appropriate vaccinations.     Office visits:  We recommend routine medical care to make sure we are addressing prevention and issues as they arise.  Typically this could mean twice yearly or up to quarterly depending upon your unique health situation.  Exams should include a yearly physical, a yearly foot exam, and other examination as appropriate.  You should see an eye doctor yearly to help screen for and prevent blood vessel complications in your eyes.  Labs: Diabetics should have blood work done at least twice yearly to monitor your Hemoglobin A1C (a three-month average of your blood sugars) and your cholesterol.  You should have your urine and blood checked yearly to screen for kidney damage.  This may include creatinine and micro-albumin levels.  Other labs as appropriate.    Blood pressure goals:  Goal blood pressure in diabetics should be 130/80 or less. Monitoring your blood pressure with a home blood pressure cuff of your own is an excellent idea.  If you are prescribed medication for blood pressure, take your medication every day, and  don't run out of medication.  Having high blood pressure can damage your heart, eyes, kidneys, and put you at risk for heart attack and stroke.  Tobacco use:  If you smoke, dip or chew, quitting will reduce your risk of heart attack, stroke, peripheral vascular disease, and many cancers.

## 2013-07-18 ENCOUNTER — Other Ambulatory Visit: Payer: Self-pay | Admitting: Medical

## 2013-07-18 DIAGNOSIS — I10 Essential (primary) hypertension: Secondary | ICD-10-CM

## 2013-07-18 LAB — COMPREHENSIVE METABOLIC PANEL
ALT: 14 U/L (ref 0–53)
Albumin: 4.7 g/dL (ref 3.5–5.2)
CO2: 29 mEq/L (ref 19–32)
Calcium: 9.8 mg/dL (ref 8.4–10.5)
Chloride: 98 mEq/L (ref 96–112)
Sodium: 137 mEq/L (ref 135–145)
Total Protein: 7.6 g/dL (ref 6.0–8.3)

## 2013-07-18 LAB — LIPID PANEL
HDL: 45 mg/dL (ref >39)
LDL Cholesterol: 115 mg/dL — ABNORMAL HIGH (ref 0–99)

## 2013-07-18 LAB — MICROALBUMIN / CREATININE URINE RATIO
Microalb Creat Ratio: 33.1 mg/g — ABNORMAL HIGH (ref 0.0–30.0)
Microalb, Ur: 5.04 mg/dL — ABNORMAL HIGH (ref 0.00–1.89)

## 2013-07-18 MED ORDER — TELMISARTAN 80 MG PO TABS: 80.0000 mg | ORAL_TABLET | Freq: Every day | ORAL | Status: DC

## 2013-07-18 MED ORDER — PREGABALIN 75 MG PO CAPS: 75.0000 mg | ORAL_CAPSULE | Freq: Two times a day (BID) | ORAL | Status: DC

## 2013-07-18 MED ORDER — METFORMIN HCL 500 MG PO TABS: 500.0000 mg | ORAL_TABLET | Freq: Two times a day (BID) | ORAL | Status: DC

## 2013-07-18 MED ORDER — ATORVASTATIN CALCIUM 40 MG PO TABS: 40.0000 mg | ORAL_TABLET | Freq: Every day | ORAL | Status: DC

## 2013-07-18 MED ORDER — INSULIN NPH ISOPHANE & REGULAR (70-30) 100 UNIT/ML ~~LOC~~ SUSP: 45.0000 [IU] | Freq: Two times a day (BID) | SUBCUTANEOUS | Status: DC

## 2013-08-26 ENCOUNTER — Encounter: Payer: Self-pay | Admitting: Medical

## 2013-08-26 ENCOUNTER — Ambulatory Visit (INDEPENDENT_AMBULATORY_CARE_PROVIDER_SITE_OTHER): Payer: BC Managed Care – PPO | Admitting: Medical

## 2013-08-26 VITALS — BP 150/88 | HR 92 | Temp 98.2°F | Resp 16 | Wt 294.0 lb

## 2013-08-26 DIAGNOSIS — E1149 Type 2 diabetes mellitus with other diabetic neurological complication: Secondary | ICD-10-CM

## 2013-08-26 DIAGNOSIS — M25473 Effusion, unspecified ankle: Secondary | ICD-10-CM

## 2013-08-26 DIAGNOSIS — M436 Torticollis: Secondary | ICD-10-CM

## 2013-08-26 DIAGNOSIS — E669 Obesity, unspecified: Secondary | ICD-10-CM

## 2013-08-26 DIAGNOSIS — E785 Hyperlipidemia, unspecified: Secondary | ICD-10-CM

## 2013-08-26 DIAGNOSIS — IMO0001 Reserved for inherently not codable concepts without codable children: Secondary | ICD-10-CM

## 2013-08-26 DIAGNOSIS — E1142 Type 2 diabetes mellitus with diabetic polyneuropathy: Secondary | ICD-10-CM

## 2013-08-26 DIAGNOSIS — I10 Essential (primary) hypertension: Secondary | ICD-10-CM

## 2013-08-26 DIAGNOSIS — E1140 Type 2 diabetes mellitus with diabetic neuropathy, unspecified: Secondary | ICD-10-CM

## 2013-08-26 DIAGNOSIS — N529 Male erectile dysfunction, unspecified: Secondary | ICD-10-CM

## 2013-08-26 DIAGNOSIS — R609 Edema, unspecified: Secondary | ICD-10-CM

## 2013-08-26 DIAGNOSIS — R809 Proteinuria, unspecified: Secondary | ICD-10-CM

## 2013-08-26 MED ORDER — SILDENAFIL CITRATE 50 MG PO TABS: 50.0000 mg | ORAL_TABLET | Freq: Every day | ORAL | Status: DC | PRN

## 2013-08-26 MED ORDER — CYCLOBENZAPRINE HCL 10 MG PO TABS: 10.0000 mg | ORAL_TABLET | Freq: Every evening | ORAL | Status: DC | PRN

## 2013-08-26 MED ORDER — TELMISARTAN-HCTZ 80-12.5 MG PO TABS: 1.0000 | ORAL_TABLET | Freq: Every day | ORAL | Status: DC

## 2013-08-26 NOTE — Progress Notes (Signed)
  Subjective:    Brandon [name] is a 52 y.o. male who presents for 85mo follow-up  Since last visit has started back Metformin, however, has had some nausea and loose stools.  Doing better with this though.  Using Novolin 45units BID.  He has made significant diet changes, cut out soda, cut out potatoes.  Fasting glucose now in the 150-200 range.    He is taking the Lipitor started last visit.    He has concerns about neck stiffness and decreased ROM, this is somewhat chronic intermittent for him.  Had prior xray in [place] showing arthritis of the neck. Wonders about referral to chiropractic.  ED - has had issues with getting erections since his surgery in 2012 for renal cell carcinoma.   Currently not seeing any quality erections.   Unable to penetrate with intercourse.   energy level is ok, sex drive is ok, but can't maintain erections.  Had tried cialis prior without much help.  The following portions of the patient's history were reviewed and updated as appropriate: allergies, current medications, past family history, past medical history, past social history, past surgical history and problem list.  ROS as in subjective above    Objective:     Filed Vitals:   08/26/13 1543  BP: 150/88  Pulse: 92  Temp: 98.2 F (36.8 C)  Resp: 16    General appearance: alert, no distress, WD/WN Neck: nontender, but generalized decreased ROM Msk: arms nontender, normal ROM  Assessment:   Encounter Diagnoses  Name Primary?  . Type II or unspecified type diabetes mellitus without mention of complication, uncontrolled Yes  . Microalbuminuria   . Diabetic neuropathy   . Essential hypertension, benign   . Ankle edema   . Erectile dysfunction   . Hyperlipidemia   . Obesity, unspecified   . Neck stiffness      Plan:   DM type II - improving glucose given recent diet changes and medication changes.  C/t Metformin 500mg  BID, c/t Novolin 70/30, 45 u BID.  Referral to dietician.  Recheck  7mo, fasting. Microalbuminuria on recent labs - c/t Micardis Diabetic neuropathy - c/t Lyrica.  He will see podiatry Thursday and discuss getting diabetic shoes HTN, edema - increase back to Micardis HCT. ED - begin trial of Viagra.  Discussed risks/benefits, proper use.  Hyperlipidemia - c/t Lipitor, recheck 87mo, fasting Obesity - c/t efforts with diet,exercise and weight loss Neck stiffness, decreased ROM - flexeril prn, heat prn, begin exercises as discussed.  Recheck 24mo

## 2013-08-26 NOTE — Patient Instructions (Signed)
Please follow up with your podiatrist regarding diabetic shoes.  Begin Micardis HCT which has the blood pressure and fluid pill combination.  Continue healthy diet changes, get plenty of water daily.  We will refer you to a dietician.  For the neck stiffness, begin Flexeril muscle relaxer at bedtime as needed.  Begin daily stretching to increase your neck range of motion . Recheck if this continues to be a problem.   Begin Viagra 50mg  samples, maximum of one per day for erections.    Sildenafil tablets (Viagra) What is this medicine? SILDENAFIL (sil DEN a fil) is used to treat erection problems in men. This medicine may be used for other purposes; ask your health care provider or pharmacist if you have questions. What should I tell my health care provider before I take this medicine? They need to know if you have any of these conditions: -bleeding disorders -eye or vision problems, including a rare inherited eye disease called retinitis pigmentosa -anatomical deformation of the penis, Peyronie's disease, or history of priapism (painful and prolonged erection) -heart disease, angina, a history of heart attack, irregular heart beats, or other heart problems -high or low blood pressure -history of blood diseases, like sickle cell anemia or leukemia -history of stomach bleeding -kidney disease -liver disease -stroke -an unusual or allergic reaction to sildenafil, other medicines, foods, dyes, or preservatives -pregnant or trying to get pregnant -breast-feeding How should I use this medicine? Take this medicine by mouth with a glass of water. Follow the directions on the prescription label. The dose is usually taken 1 hour before sexual activity. You should not take the dose more than once per day. Do not take your medicine more often than directed. Talk to your pediatrician regarding the use of this medicine in children. This medicine is not used in children for this  condition. Overdosage: If you think you have taken too much of this medicine contact a poison control center or emergency room at once. NOTE: This medicine is only for you. Do not share this medicine with others. What if I miss a dose? This does not apply. Do not take double or extra doses. What may interact with this medicine? Do not take this medicine with any of the following medications: -cisapride -methscopolamine nitrate -nitrates like amyl nitrite, isosorbide dinitrate, isosorbide mononitrate, nitroglycerin -nitroprusside -other medicines for erectile dysfunction like avanafil, tadalafil, vardenafil -other sildenafil products (Revatio)  This medicine may also interact with the following medications: -certain drugs for high blood pressure -certain drugs for the treatment of HIV infection or AIDS -certain drugs used for fungal or yeast infections, like fluconazole, itraconazole, ketoconazole, and voriconazole -cimetidine -erythromycin -rifampin This list may not describe all possible interactions. Give your health care provider a list of all the medicines, herbs, non-prescription drugs, or dietary supplements you use. Also tell them if you smoke, drink alcohol, or use illegal drugs. Some items may interact with your medicine. What should I watch for while using this medicine? If you notice any changes in your vision while taking this drug, call your doctor or health care professional as soon as possible. Stop using this medicine and call your health care provider right away if you have a loss of sight in one or both eyes. Contact you doctor or health care professional right away if the erection lasts longer than 4 hours or if it becomes painful. This may be a sign of serious problem and must be treated right away to prevent permanent damage. If you  experience symptoms of nausea, dizziness, chest pain or arm pain upon initiation of sexual activity after taking this medicine, you should  refrain from further activity and call your doctor or health care professional as soon as possible. Do not drink alcohol to excess (examples, 5 glasses of wine or 5 shots of whiskey) when taking this medicine. When taken in excess, alcohol can increase your chances of getting a headache or getting dizzy, increasing your heart rate or lowering your blood pressure. Using this medicine does not protect you or your partner against HIV infection (the virus that causes AIDS) or other sexually transmitted diseases. What side effects may I notice from receiving this medicine? Side effects that you should report to your doctor or health care professional as soon as possible: -allergic reactions like skin rash, itching or hives, swelling of the face, lips, or tongue -breathing problems -changes in hearing -changes in vision -chest pain -erection lasting more than 4 hours -fast, irregular heartbeat -seizures  Side effects that usually do not require medical attention (report to your doctor or health care professional if they continue or are bothersome): -back pain -dizziness -flushing -headache -indigestion -muscle aches -nausea -stuffy or runny nose This list may not describe all possible side effects. Call your doctor for medical advice about side effects. You may report side effects to FDA at 1-800-FDA-1088. Where should I keep my medicine? Keep out of reach of children. Store at room temperature between 15 and 30 degrees C (59 and 86 degrees F). Throw away any unused medicine after the expiration date. NOTE: This sheet is a summary. It may not cover all possible information. If you have questions about this medicine, talk to your doctor, pharmacist, or health care provider.  2012, [employer]/Gold Standard. (05/17/2011 1:17:06 PM)   Return in 2 months recheck fasting for cholesterol and diabetes.

## 2013-08-27 ENCOUNTER — Other Ambulatory Visit: Payer: Self-pay | Admitting: Family Medicine

## 2013-08-27 ENCOUNTER — Telehealth: Payer: Self-pay | Admitting: Family Medicine

## 2013-08-27 DIAGNOSIS — E669 Obesity, unspecified: Secondary | ICD-10-CM

## 2013-08-27 NOTE — Telephone Encounter (Signed)
Message copied by Colbie Danner L on Tue Aug 27, 2013 10:06 AM ------      Message from: Carlena Hurl      Created: Mon Aug 26, 2013  8:10 PM       Refer to dietician for diabetes, obesity, hyperlipidemia ------

## 2013-08-27 NOTE — Telephone Encounter (Signed)
Referral was sent over to [facility]. They will contact the patient with his appointment. CLS

## 2013-11-04 DIAGNOSIS — R569 Unspecified convulsions: Secondary | ICD-10-CM

## 2013-11-04 DIAGNOSIS — I639 Cerebral infarction, unspecified: Secondary | ICD-10-CM

## 2013-11-04 HISTORY — DX: Cerebral infarction, unspecified: I63.9

## 2013-11-04 HISTORY — DX: Unspecified convulsions: R56.9

## 2013-11-16 ENCOUNTER — Inpatient Hospital Stay (HOSPITAL_COMMUNITY)
Admission: EM | Admit: 2013-11-16 | Discharge: 2013-11-20 | DRG: 064 | Disposition: A | Discharge status: Home / Self Care | Payer: BC Managed Care – PPO | Attending: Internal Medicine | Admitting: Internal Medicine

## 2013-11-16 ENCOUNTER — Encounter (HOSPITAL_COMMUNITY): Payer: Self-pay | Admitting: Emergency Medicine

## 2013-11-16 ENCOUNTER — Emergency Department (HOSPITAL_COMMUNITY): Payer: BC Managed Care – PPO

## 2013-11-16 DIAGNOSIS — E1100 Type 2 diabetes mellitus with hyperosmolarity without nonketotic hyperglycemic-hyperosmolar coma (NKHHC): Secondary | ICD-10-CM | POA: Diagnosis present

## 2013-11-16 DIAGNOSIS — R29810 Facial weakness: Secondary | ICD-10-CM | POA: Diagnosis present

## 2013-11-16 DIAGNOSIS — Z6838 Body mass index (BMI) 38.0-38.9, adult: Secondary | ICD-10-CM

## 2013-11-16 DIAGNOSIS — Z79899 Other long term (current) drug therapy: Secondary | ICD-10-CM

## 2013-11-16 DIAGNOSIS — E119 Type 2 diabetes mellitus without complications: Secondary | ICD-10-CM

## 2013-11-16 DIAGNOSIS — I498 Other specified cardiac arrhythmias: Secondary | ICD-10-CM | POA: Diagnosis present

## 2013-11-16 DIAGNOSIS — Z23 Encounter for immunization: Secondary | ICD-10-CM

## 2013-11-16 DIAGNOSIS — R569 Unspecified convulsions: Secondary | ICD-10-CM | POA: Diagnosis present

## 2013-11-16 DIAGNOSIS — K5792 Diverticulitis of intestine, part unspecified, without perforation or abscess without bleeding: Secondary | ICD-10-CM

## 2013-11-16 DIAGNOSIS — G8190 Hemiplegia, unspecified affecting unspecified side: Secondary | ICD-10-CM | POA: Diagnosis present

## 2013-11-16 DIAGNOSIS — E785 Hyperlipidemia, unspecified: Secondary | ICD-10-CM | POA: Diagnosis present

## 2013-11-16 DIAGNOSIS — Z85528 Personal history of other malignant neoplasm of kidney: Secondary | ICD-10-CM

## 2013-11-16 DIAGNOSIS — N179 Acute kidney failure, unspecified: Secondary | ICD-10-CM

## 2013-11-16 DIAGNOSIS — G936 Cerebral edema: Secondary | ICD-10-CM | POA: Diagnosis present

## 2013-11-16 DIAGNOSIS — I10 Essential (primary) hypertension: Secondary | ICD-10-CM | POA: Diagnosis present

## 2013-11-16 DIAGNOSIS — I639 Cerebral infarction, unspecified: Secondary | ICD-10-CM | POA: Diagnosis present

## 2013-11-16 DIAGNOSIS — E1101 Type 2 diabetes mellitus with hyperosmolarity with coma: Secondary | ICD-10-CM

## 2013-11-16 DIAGNOSIS — Z794 Long term (current) use of insulin: Secondary | ICD-10-CM

## 2013-11-16 DIAGNOSIS — E669 Obesity, unspecified: Secondary | ICD-10-CM | POA: Diagnosis present

## 2013-11-16 DIAGNOSIS — G894 Chronic pain syndrome: Secondary | ICD-10-CM

## 2013-11-16 DIAGNOSIS — I6350 Cerebral infarction due to unspecified occlusion or stenosis of unspecified cerebral artery: Principal | ICD-10-CM

## 2013-11-16 LAB — COMPREHENSIVE METABOLIC PANEL
ALT: 31 U/L (ref 0–53)
Albumin: 4.6 g/dL (ref 3.5–5.2)
Alkaline Phosphatase: 172 U/L — ABNORMAL HIGH (ref 39–117)
Creatinine, Ser: 1.17 mg/dL (ref 0.50–1.35)
GFR calc non Af Amer: 71 mL/min — ABNORMAL LOW (ref >90)
Glucose, Bld: 733 mg/dL (ref 70–99)
Potassium: 4.5 mEq/L (ref 3.5–5.1)
Sodium: 129 mEq/L — ABNORMAL LOW (ref 135–145)
Total Protein: 8.6 g/dL — ABNORMAL HIGH (ref 6.0–8.3)

## 2013-11-16 LAB — GLUCOSE, CAPILLARY
Glucose-Capillary: >600 mg/dL (ref 70–99)
Glucose-Capillary: 531 mg/dL — ABNORMAL HIGH (ref 70–99)

## 2013-11-16 LAB — CBC WITH DIFFERENTIAL/PLATELET
Basophils Relative: 0 % (ref 0–1)
Eosinophils Absolute: 0.1 10*3/uL (ref 0.0–0.7)
Hemoglobin: 16 g/dL (ref 13.0–17.0)
Lymphocytes Relative: 25 % (ref 12–46)
MCH: 31.1 pg (ref 26.0–34.0)
MCHC: 36.9 g/dL — ABNORMAL HIGH (ref 30.0–36.0)
Neutrophils Relative %: 68 % (ref 43–77)
Platelets: 222 10*3/uL (ref 150–400)
WBC: 6.1 10*3/uL (ref 4.0–10.5)

## 2013-11-16 LAB — PROTIME-INR
INR: 1.2 (ref 0.00–1.49)
Prothrombin Time: 14.9 seconds (ref 11.6–15.2)

## 2013-11-16 LAB — APTT: aPTT: 34 seconds (ref 24–37)

## 2013-11-16 MED ORDER — SODIUM CHLORIDE 0.9 % IV SOLN
500.0000 mg | Freq: Two times a day (BID) | INTRAVENOUS | Status: DC
  Administered 2013-11-17 – 2013-11-18 (×3): 500 mg via INTRAVENOUS
  Filled 2013-11-16 (×4): qty 5

## 2013-11-16 MED ORDER — SODIUM CHLORIDE 0.9 % IV BOLUS (SEPSIS)
1000.0000 mL | Freq: Once | INTRAVENOUS | Status: AC
  Administered 2013-11-16: 1000 mL via INTRAVENOUS

## 2013-11-16 MED ORDER — LEVETIRACETAM 500 MG PO TABS
500.0000 mg | ORAL_TABLET | Freq: Two times a day (BID) | ORAL | Status: DC
  Filled 2013-11-16: qty 1

## 2013-11-16 MED ORDER — SODIUM CHLORIDE 0.9 % IV SOLN
INTRAVENOUS | Status: DC
  Administered 2013-11-16: via INTRAVENOUS

## 2013-11-16 MED ORDER — TELMISARTAN-HCTZ 80-12.5 MG PO TABS: 1.0000 | ORAL_TABLET | Freq: Every day | ORAL | Status: DC

## 2013-11-16 MED ORDER — INSULIN ASPART 100 UNIT/ML ~~LOC~~ SOLN
10.0000 [IU] | Freq: Once | SUBCUTANEOUS | Status: AC
  Administered 2013-11-16: 10 [IU] via INTRAVENOUS
  Filled 2013-11-16: qty 1

## 2013-11-16 MED ORDER — ASPIRIN 325 MG PO TABS
325.0000 mg | ORAL_TABLET | Freq: Every day | ORAL | Status: DC
  Administered 2013-11-17 – 2013-11-20 (×4): 325 mg via ORAL
  Filled 2013-11-16 (×4): qty 1

## 2013-11-16 MED ORDER — SODIUM CHLORIDE 0.9 % IV SOLN
INTRAVENOUS | Status: DC
  Administered 2013-11-17: 5.9 [IU]/h via INTRAVENOUS
  Administered 2013-11-17: 2.2 [IU]/h via INTRAVENOUS
  Administered 2013-11-17: 3.7 [IU]/h via INTRAVENOUS
  Administered 2013-11-17: 4.6 [IU]/h via INTRAVENOUS
  Administered 2013-11-17: 2.2 [IU]/h via INTRAVENOUS
  Filled 2013-11-16 (×2): qty 1

## 2013-11-16 MED ORDER — DEXTROSE 50 % IV SOLN: 25.0000 mL | INTRAVENOUS | Status: DC | PRN

## 2013-11-16 MED ORDER — SODIUM CHLORIDE 0.9 % IV SOLN
1000.0000 mg | Freq: Once | INTRAVENOUS | Status: AC
  Administered 2013-11-16: 1000 mg via INTRAVENOUS
  Filled 2013-11-16: qty 10

## 2013-11-16 MED ORDER — ATORVASTATIN CALCIUM 40 MG PO TABS
40.0000 mg | ORAL_TABLET | Freq: Every day | ORAL | Status: DC
  Administered 2013-11-17 – 2013-11-20 (×4): 40 mg via ORAL
  Filled 2013-11-16 (×4): qty 1

## 2013-11-16 MED ORDER — PREGABALIN 75 MG PO CAPS
75.0000 mg | ORAL_CAPSULE | Freq: Two times a day (BID) | ORAL | Status: DC
  Administered 2013-11-17 – 2013-11-18 (×4): 75 mg via ORAL
  Filled 2013-11-16: qty 3
  Filled 2013-11-16 (×2): qty 1
  Filled 2013-11-16 (×2): qty 3

## 2013-11-16 NOTE — ED Notes (Signed)
Neurologist at bedside. 

## 2013-11-16 NOTE — ED Notes (Signed)
Patient having seizure, EDP notified and at bedside, patient suctioned, NRB applied, head protected.  Seizure lasting approximately 1 minute, patient alert during postictal period.  Able to control secretions at this time.  Seizure precautions initiated, EDP to place orders

## 2013-11-16 NOTE — ED Notes (Signed)
[id number]. Notified RN

## 2013-11-16 NOTE — ED Notes (Signed)
EDP at bedside assessing patient, No Code stroke per EDP

## 2013-11-16 NOTE — ED Provider Notes (Signed)
Date: 11/16/2013  Rate: 105  Rhythm: sinus tachycardia  QRS Axis: normal  Intervals: normal  ST/T Wave abnormalities: normal  Conduction Disutrbances:none  Narrative Interpretation:   Old EKG Reviewed: unchanged    Orpah Greek, MD 11/16/13 2125

## 2013-11-16 NOTE — H&P (Signed)
[facility] History and Physical  Oza Ramnath [id number] DOB: 02/25/1961 DOA: 11/16/2013  Referring physician: EDP PCP: Wyatt Haste, MD   Chief Complaint: Seizures, Stroke   HPI: Brandon [name] is a 52 y.o. male who developed headache and difficulty with vision 3 days ago.  Wife noted increased confusion since that time and attributed it to his ongoing headache.  Has also had N/V.  Today wife noted mild facial droop and therefore made him come to ED.  Patient not aware of deficits.  While in ED patient noted to have L sided facial and arm twitching as well, wife reports similar episode last night.  Review of Systems: Systems reviewed.  As above, otherwise negative  Past Medical History  Diagnosis Date  . Hypertension   . Diabetes mellitus     DKA sept admission  . Diverticulitis     s/p micorperforation Sept 2012-managed conservatively by Gen surgery  . Kidney tumor 09/2011    Renal cell CA  . Wears glasses   . ED (erectile dysfunction)   . 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 Kidney surgery      ablation of renal cell CA - 12/28, prior one was October [place] History:  reports that he has never smoked. He has never used smokeless tobacco. He reports that he does not drink alcohol or use illicit drugs.  No Known Allergies  Family History  Problem Relation Age of Onset  . Diabetes Father      Prior to Admission medications   Medication Sig Start Date End Date Taking? Authorizing Provider  atorvastatin (LIPITOR) 40 MG tablet Take 40 mg by mouth daily.   Yes Historical Provider, MD  cyclobenzaprine (FLEXERIL) 10 MG tablet Take 10 mg by mouth at bedtime as needed for muscle spasms.   Yes Historical Provider, MD  insulin NPH-regular (NOVOLIN 70/30) (70-30) 100 UNIT/ML injection Inject 45 Units into the skin 2 (two) times daily with a meal.    Yes Historical Provider, MD  metFORMIN (GLUCOPHAGE) 500 MG tablet Take 500 mg by mouth 2 (two) times daily with a meal.   Yes Historical Provider, MD  naproxen sodium (ANAPROX) 220 MG tablet Take 660-880 mg by mouth 2 (two) times daily as needed (for headache).   Yes Historical Provider, MD  pregabalin (LYRICA) 75 MG capsule Take 75 mg by mouth 2 (two) times daily.   Yes Historical Provider, MD  sildenafil (VIAGRA) 50 MG tablet Take 50 mg by mouth daily as needed for erectile dysfunction.   Yes Historical Provider, MD  telmisartan-hydrochlorothiazide (MICARDIS HCT) 80-12.5 MG per tablet Take 1 tablet by mouth daily.   Yes Historical Provider, MD   Physical Exam: Filed Vitals:   11/16/13 2300  BP: 130/81  Pulse: 97  Temp:   Resp: 17    BP 130/81  Pulse 97  Temp(Src) 98.4 F (36.9 C) (Oral)  Resp 17  SpO2 98%  General Appearance:    Alert, oriented, no distress, appears stated age  Head:    Normocephalic, atraumatic  Eyes:    PERRL, EOMI, sclera non-icteric        Nose:   Nares without drainage or epistaxis. Mucosa, turbinates normal  Throat:   Moist mucous membranes. Oropharynx without erythema or exudate.  Neck:   Supple. No carotid bruits.  No thyromegaly.  No lymphadenopathy.   Back:  No CVA tenderness, no spinal tenderness  Lungs:     Clear to auscultation bilaterally, without wheezes, rhonchi or rales  Chest wall:    No tenderness to palpitation  Heart:    Regular rate and rhythm without murmurs, gallops, rubs  Abdomen:     Soft, non-tender, nondistended, normal bowel sounds, no organomegaly  Genitalia:    deferred  Rectal:    deferred  Extremities:   No clubbing, cyanosis or edema.  Pulses:   2+ and symmetric all extremities  Skin:   Skin color, texture, turgor normal, no rashes or lesions  Lymph nodes:   Cervical, supraclavicular, and axillary nodes normal  Neurologic:   4/5 strength in arm and leg on the L side, L facial droop very minor    Labs on Admission:   Basic Metabolic Panel:  Recent Labs Lab 11/16/13 2100  NA 129*  K 4.5  CL 88*  CO2 27  GLUCOSE 733*  BUN 24*  CREATININE 1.17  CALCIUM 9.3   Liver Function Tests:  Recent Labs Lab 11/16/13 2100  AST 30  ALT 31  ALKPHOS 172*  BILITOT 1.6*  PROT 8.6*  ALBUMIN 4.6   No results found for this basename: LIPASE, AMYLASE,  in the last 168 hours No results found for this basename: AMMONIA,  in the last 168 hours CBC:  Recent Labs Lab 11/16/13 2100  WBC 6.1  NEUTROABS 4.1  HGB 16.0  HCT 43.4  MCV 84.4  PLT 222   Cardiac Enzymes: No results found for this basename: CKTOTAL, CKMB, CKMBINDEX, TROPONINI,  in the last 168 hours  BNP (last 3 results) No results found for this basename: PROBNP,  in the last 8760 hours CBG:  Recent Labs Lab 11/16/13 2056 11/16/13 2100 11/16/13 2235  GLUCAP >600* >600* >600*    Radiological Exams on Admission: Ct Head Wo Contrast  11/16/2013   CLINICAL DATA:  Severe headache. Initially a code stroke but canceled. Stroke risk factors include diabetes and hypertension. History of renal cell cancer.  EXAM: CT HEAD WITHOUT CONTRAST  TECHNIQUE: Contiguous axial images were obtained from the base of the skull through the vertex without contrast.  COMPARISON:  None  FINDINGS: There is a moderately large area of cytotoxic edema affecting much of the right temporal lobe anteriorly and laterally within the distribution of the right middle cerebral artery. This is consistent with an infarction probably 48-72 hr of age. There is no hemorrhagic transformation. There is no midline shift or significant displacement of the uncus.  No definite thrombus is seen in the right MCA or its branches. There is vascular calcification of a moderate degree affecting the carotid siphons.  No other areas of cortical infarction are seen. The ventricles are normal in size and midline. There is no extra-axial fluid or mass lesion. Calvarium is intact. Clear sinuses and  mastoids.  IMPRESSION: Moderately large area of cytotoxic edema affecting the right temporal lobe consistent with an acute right MCA territory infarct without hemorrhage. Given the hypoattenuation and relatively well delineated area of infarction, this insult is likely 63-72 hr old.  Even though the code stroke was canceled, I personally communicated these findings to the ordering provider, who voiced understanding.   Electronically Signed   By: Rolla Flatten M.D.   On: 11/16/2013 21:23    EKG: Independently reviewed.  Assessment/Plan Principal Problem:   Acute ischemic stroke Active Problems:   Focal seizure   Stroke   Type 2 diabetes mellitus with hyperosmolar nonketotic hyperglycemia  1. Acute ischemic stroke - occurred 3 days ago, seen on CT scan, on stroke protocol for work up, see Dr. Cecil Cobbs note 2. Focal seizure - additionally patient had focal seizure while in ED, on keppra, also treating his high BGL which can contribute to this in the setting of temporal stroke. 3. HHS - insulin gtt per protocol, BMP q4h replace potassium PRN, will try to hold off on dextrose in maint IVF, but use if needed.    Code Status: Full  Family Communication: Wife at bedside Disposition Plan: Admit to SDU   Time spent: 70 min  Jaquarious Grey M. [facility] Pager [phone]  If 7AM-7PM, please contact the day team taking care of the patient [email or web address] Password [facility] 11/16/2013, 11:51 PM

## 2013-11-16 NOTE — ED Provider Notes (Signed)
CSN: [id number]     Arrival date & time 11/16/13  2030 History   First MD Initiated Contact with Patient 11/16/13 2046     Chief Complaint  Patient presents with  . Code Stroke   (Consider location/radiation/quality/duration/timing/severity/associated sxs/prior Treatment) HPI Brandon [name] is a 52 y.o. male who presents to the emergency department for concern of a possible stroke.  Patient reports that he has been having R sided headaches over the last week.  Slowly worsening in severity.  Then, today the wife noted that the patient was having some facial droop and drooling on the L side.  She thinks that she saw him normal approximately 10 hours prior but she is unsure.  Patient feels weak in LUE and a change in sensation in the L face and L arm.  No weakness or sensation changes in legs.  No problems speaking.  No other symptoms.  Past Medical History  Diagnosis Date  . Hypertension   . Diabetes mellitus     DKA sept admission  . Diverticulitis     s/p micorperforation Sept 2012-managed conservatively by Gen surgery  . Kidney tumor 09/2011    Renal cell CA  . Wears glasses   . ED (erectile dysfunction)   . 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 Kidney surgery      ablation of renal cell CA - 12/28, prior one was October [place]   Family History  Problem Relation Age of Onset  . Diabetes Father    History  Substance Use Topics  . Smoking status: Never Smoker   . Smokeless tobacco: Never Used  . Alcohol Use: No    Review of Systems  Constitutional: Negative for fever and chills.  HENT: Negative for congestion and sore throat.   Respiratory: Negative for cough.   Gastrointestinal: Negative for nausea, vomiting, abdominal pain, diarrhea and constipation.  Endocrine: Negative for polyuria.  Genitourinary: Negative for dysuria and hematuria.  Musculoskeletal: Negative for  neck pain.  Skin: Negative for rash.  Neurological: Positive for weakness, numbness and headaches. Negative for seizures.  Psychiatric/Behavioral: Negative.   All other systems reviewed and are negative.    Allergies  Review of patient's allergies indicates no known allergies.  Home Medications   Current Outpatient Rx  Name  Route  Sig  Dispense  Refill  . atorvastatin (LIPITOR) 40 MG tablet   Oral   Take 40 mg by mouth daily.         . cyclobenzaprine (FLEXERIL) 10 MG tablet   Oral   Take 10 mg by mouth at bedtime as needed for muscle spasms.         . insulin NPH-regular (NOVOLIN 70/30) (70-30) 100 UNIT/ML injection   Subcutaneous   Inject 45 Units into the skin 2 (two) times daily with a meal.         . metFORMIN (GLUCOPHAGE) 500 MG tablet   Oral   Take 500 mg by mouth 2 (two) times daily with a meal.         . naproxen sodium (ANAPROX) 220 MG tablet   Oral   Take 660-880 mg by mouth 2 (two) times daily as needed (for headache).         . pregabalin (LYRICA) 75 MG capsule   Oral   Take 75 mg by mouth 2 (two) times daily.         [name]  sildenafil (VIAGRA) 50 MG tablet   Oral   Take 50 mg by mouth daily as needed for erectile dysfunction.         [name] telmisartan-hydrochlorothiazide (MICARDIS HCT) 80-12.5 MG per tablet   Oral   Take 1 tablet by mouth daily.          BP 130/81  Pulse 97  Temp(Src) 98.4 F (36.9 C) (Oral)  Resp 17  SpO2 98% Physical Exam  Nursing note and vitals reviewed. Constitutional: He is oriented to person, place, and time. He appears well-developed and well-nourished. No distress.  HENT:  Head: Normocephalic and atraumatic.  Right Ear: External ear normal.  Left Ear: External ear normal.  Mouth/Throat: Oropharynx is clear and moist. No oropharyngeal exudate.  Eyes: Conjunctivae are normal. Pupils are equal, round, and reactive to light. Right eye exhibits no discharge.  Neck: Normal range of motion. Neck supple. No  tracheal deviation present.  Cardiovascular: Normal rate, regular rhythm and intact distal pulses.   Pulmonary/Chest: Effort normal. No respiratory distress. He has no wheezes. He has no rales.  Abdominal: Soft. He exhibits no distension. There is no tenderness. There is no rebound and no guarding.  Musculoskeletal: Normal range of motion.  Neurological: He is alert and oriented to person, place, and time. A cranial nerve deficit (L sided facial droop) and sensory deficit (L side of face and L arm subjective differences) is present. GCS eye subscore is 4. GCS verbal subscore is 5. GCS motor subscore is 6.  LUE drift.  Otherwise normal strength.  Skin: Skin is warm and dry. No rash noted. He is not diaphoretic.  Psychiatric: He has a normal mood and affect.    ED Course  Procedures (including critical care time) Labs Review Labs Reviewed  CBC WITH DIFFERENTIAL - Abnormal; Notable for the following:    MCHC 36.9 (*)    All other components within normal limits  COMPREHENSIVE METABOLIC PANEL - Abnormal; Notable for the following:    Sodium 129 (*)    Chloride 88 (*)    Glucose, Bld 733 (*)    BUN 24 (*)    Total Protein 8.6 (*)    Alkaline Phosphatase 172 (*)    Total Bilirubin 1.6 (*)    GFR calc non Af Amer 71 (*)    GFR calc Af Amer 82 (*)    All other components within normal limits  GLUCOSE, CAPILLARY - Abnormal; Notable for the following:    Glucose-Capillary >600 (*)    All other components within normal limits  GLUCOSE, CAPILLARY - Abnormal; Notable for the following:    Glucose-Capillary >600 (*)    All other components within normal limits  GLUCOSE, CAPILLARY - Abnormal; Notable for the following:    Glucose-Capillary >600 (*)    All other components within normal limits  APTT  PROTIME-INR  BASIC METABOLIC PANEL  BASIC METABOLIC PANEL  BASIC METABOLIC PANEL  BASIC METABOLIC PANEL   Imaging Review Ct Head Wo Contrast  11/16/2013   CLINICAL DATA:  Severe headache.  Initially a code stroke but canceled. Stroke risk factors include diabetes and hypertension. History of renal cell cancer.  EXAM: CT HEAD WITHOUT CONTRAST  TECHNIQUE: Contiguous axial images were obtained from the base of the skull through the vertex without contrast.  COMPARISON:  None  FINDINGS: There is a moderately large area of cytotoxic edema affecting much of the right temporal lobe anteriorly and laterally within the distribution of the right middle cerebral artery. This is  consistent with an infarction probably 48-72 hr of age. There is no hemorrhagic transformation. There is no midline shift or significant displacement of the uncus.  No definite thrombus is seen in the right MCA or its branches. There is vascular calcification of a moderate degree affecting the carotid siphons.  No other areas of cortical infarction are seen. The ventricles are normal in size and midline. There is no extra-axial fluid or mass lesion. Calvarium is intact. Clear sinuses and mastoids.  IMPRESSION: Moderately large area of cytotoxic edema affecting the right temporal lobe consistent with an acute right MCA territory infarct without hemorrhage. Given the hypoattenuation and relatively well delineated area of infarction, this insult is likely 68-72 hr old.  Even though the code stroke was canceled, I personally communicated these findings to the ordering provider, who voiced understanding.   Electronically Signed   By: Rolla Flatten M.D.   On: 11/16/2013 21:23    EKG Interpretation   None       MDM   1. Acute ischemic stroke   2. Focal seizure   3. Stroke   4. Type 2 diabetes mellitus with hyperosmolar nonketotic hyperglycemia    Candido Zukoski is a 52 y.o. male who presents to the emergency department for concern of a stroke.  Unclear exact onset, but patient definitely outside of code stroke window.  CT scan with R sided MCA stroke.  Correlating well with symptoms and exam.  Glucose noted to be >700.  Neurology  consulted and recommending hospitalist admission.  Patient with a few focal seizures in L arm and L face.  Keppra and Ativan given.  Hospitalist consulted for admission.  Patient protecting airway and handling secretions.  All questions answered.  Patient admitted.    Rogelia Mire, MD 11/17/13 [phone]

## 2013-11-16 NOTE — ED Notes (Signed)
Dr. Gardner at bedside 

## 2013-11-16 NOTE — Consult Note (Signed)
Neurology Consultation Reason for Consult: Stroke Referring Physician: Betsey Holiday, C  CC: Stroke  History is obtained from: Patient, wife  HPI: Brandon [name] is a 52 y.o. male with a history of diabetes, hypertension who presents with 3 days of headache and confusion. He reports noticing that he was having trouble seeing 3 days ago and reported headache that started around the same time. Since that time, his wife has noticed confusion, but attributed it to his ongoing headache. He also has had some nausea and vomiting. Today, his wife noticed that he had a mild left facial droop and therefore made him come in to the ED. He was not aware of his deficits, and after discussing with the wife, it is possible that she did not notice him on previous days.  In the ED he was noted to have left-sided facial and arm twitching. His wife reports that he had a similar episode last night.   LKW: 3 days ago tpa given?: no, outside of window    ROS: A 14 point ROS was performed and is negative except as noted in the HPI.  Past Medical History  Diagnosis Date  . Hypertension   . Diabetes mellitus     DKA sept admission  . Diverticulitis     s/p micorperforation Sept 2012-managed conservatively by Gen surgery  . Kidney tumor 09/2011    Renal cell CA  . Wears glasses   . ED (erectile dysfunction)   . 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 Immediate and remote memory are intact. No signs of aphasia. He has prominent signs of neglect. Cranial Nerves: II: Visual Fields are notable for left hemianopia. Pupils are equal, round, and reactive to light.  Discs are difficult to visualize. III,IV, VI: EOMI without ptosis or diploplia.  V: Facial sensation is decreased on left VII: Facial movement is notable for left facial droop VIII: hearing is intact to voice X: Uvula elevates symmetrically XI: Shoulder shrug is symmetric. XII: tongue is midline without atrophy or fasciculations.  Motor: Tone is normal. Bulk is normal. 5/5 strength was present on the right side, on the left he has 4/5 strength in the arm and leg. Sensory: Sensation is decreased on the left, though not clear that the patient is aware of this Deep Tendon Reflexes: 2+ and symmetric in the biceps and patellae.  Cerebellar: FNF  intact on right, mild difficulty on left commensurate with weakness Gait: Not assessed due to multiple medical monitors in ED setting.  I have reviewed labs in epic and the results pertinent to this consultation are: Glucose-733 Mild hyponatremia  I have reviewed the images obtained: CT head-inferior division MCA infarct on the right.  Impression: 52 year old male with nondominant temporal lobe infarct that likely occurred 3 days ago. A CT correlates this timeframe. He'll need to be admitted for stroke workup.  His focal seizures are likely related to a stroke, but could also be due to hyperosmolar state. Given the 2 episodes have occurred, I would favor starting antiepileptic therapy at this time.  Recommendations: 1. HgbA1c, fasting lipid panel 2. MRI, MRA  of the brain without contrast 3. Frequent neuro checks 4. Echocardiogram 5. Carotid dopplers 6. Prophylactic therapy-Antiplatelet med: Aspirin - dose 325mg  7. Risk factor modification 8. Telemetry monitoring 9. PT consult, OT consult,  Speech consult 10. EEG 11. Keppra 500 mg twice a day following 1 g load    Roland Rack, MD [facility] [phone]  If 7pm- 7am, please page neurology on call at [phone].

## 2013-11-16 NOTE — ED Notes (Signed)
EDP at bedside, RRN at bedside, CT called

## 2013-11-16 NOTE — ED Notes (Signed)
Dr. Leonides Schanz called regarding Keppra tablet, order changed, see MAR

## 2013-11-16 NOTE — ED Notes (Signed)
Patient presents with left facial droop, eye tearing, studdering, pounding in his head, left arm drift

## 2013-11-17 ENCOUNTER — Inpatient Hospital Stay (HOSPITAL_COMMUNITY): Payer: BC Managed Care – PPO

## 2013-11-17 LAB — BASIC METABOLIC PANEL
BUN: 18 mg/dL (ref 6–23)
BUN: 19 mg/dL (ref 6–23)
BUN: 22 mg/dL (ref 6–23)
CO2: 28 mEq/L (ref 19–32)
CO2: 26 mEq/L (ref 19–32)
Calcium: 8.9 mg/dL (ref 8.4–10.5)
Calcium: 8.8 mg/dL (ref 8.4–10.5)
Calcium: 8.8 mg/dL (ref 8.4–10.5)
Chloride: 99 mEq/L (ref 96–112)
Chloride: 103 mEq/L (ref 96–112)
Chloride: 104 mEq/L (ref 96–112)
Creatinine, Ser: 0.91 mg/dL (ref 0.50–1.35)
Creatinine, Ser: 0.93 mg/dL (ref 0.50–1.35)
Creatinine, Ser: 0.97 mg/dL (ref 0.50–1.35)
Creatinine, Ser: 1.06 mg/dL (ref 0.50–1.35)
GFR calc Af Amer: >90 mL/min (ref >90)
GFR calc Af Amer: >90 mL/min (ref >90)
GFR calc Af Amer: >90 mL/min (ref >90)
GFR calc non Af Amer: 80 mL/min — ABNORMAL LOW (ref >90)
GFR calc non Af Amer: >90 mL/min (ref >90)
GFR calc non Af Amer: >90 mL/min (ref >90)
GFR calc non Af Amer: >90 mL/min (ref >90)
Glucose, Bld: 138 mg/dL — ABNORMAL HIGH (ref 70–99)
Glucose, Bld: 151 mg/dL — ABNORMAL HIGH (ref 70–99)
Glucose, Bld: 561 mg/dL (ref 70–99)
Potassium: 4.1 mEq/L (ref 3.5–5.1)
Potassium: 4.3 mEq/L (ref 3.5–5.1)
Sodium: 137 mEq/L (ref 135–145)
Sodium: 140 mEq/L (ref 135–145)

## 2013-11-17 LAB — LIPID PANEL
HDL: 26 mg/dL — ABNORMAL LOW (ref >39)
Total CHOL/HDL Ratio: 5.3 RATIO
Triglycerides: 190 mg/dL — ABNORMAL HIGH (ref <150)

## 2013-11-17 LAB — GLUCOSE, CAPILLARY
Glucose-Capillary: 498 mg/dL — ABNORMAL HIGH (ref 70–99)
Glucose-Capillary: 395 mg/dL — ABNORMAL HIGH (ref 70–99)
Glucose-Capillary: 316 mg/dL — ABNORMAL HIGH (ref 70–99)
Glucose-Capillary: 298 mg/dL — ABNORMAL HIGH (ref 70–99)
Glucose-Capillary: 283 mg/dL — ABNORMAL HIGH (ref 70–99)
Glucose-Capillary: 150 mg/dL — ABNORMAL HIGH (ref 70–99)
Glucose-Capillary: 132 mg/dL — ABNORMAL HIGH (ref 70–99)
Glucose-Capillary: 163 mg/dL — ABNORMAL HIGH (ref 70–99)
Glucose-Capillary: 243 mg/dL — ABNORMAL HIGH (ref 70–99)

## 2013-11-17 LAB — HEMOGLOBIN A1C
Hgb A1c MFr Bld: 12.2 % — ABNORMAL HIGH (ref <5.7)
Mean Plasma Glucose: 303 mg/dL — ABNORMAL HIGH (ref <117)

## 2013-11-17 LAB — MRSA PCR SCREENING: MRSA by PCR: NEGATIVE

## 2013-11-17 MED ORDER — INFLUENZA VAC SPLIT QUAD 0.5 ML IM SUSP
0.5000 mL | INTRAMUSCULAR | Status: AC
  Administered 2013-11-18: 0.5 mL via INTRAMUSCULAR
  Filled 2013-11-17: qty 0.5

## 2013-11-17 MED ORDER — ACETAMINOPHEN 325 MG PO TABS
650.0000 mg | ORAL_TABLET | Freq: Four times a day (QID) | ORAL | Status: DC | PRN
  Administered 2013-11-17: 650 mg via ORAL
  Filled 2013-11-17 (×2): qty 2

## 2013-11-17 MED ORDER — HYDROCHLOROTHIAZIDE 12.5 MG PO CAPS
12.5000 mg | ORAL_CAPSULE | Freq: Every day | ORAL | Status: DC
  Administered 2013-11-17 – 2013-11-20 (×4): 12.5 mg via ORAL
  Filled 2013-11-17 (×4): qty 1

## 2013-11-17 MED ORDER — DEXTROSE-NACL 5-0.45 % IV SOLN
INTRAVENOUS | Status: DC
  Administered 2013-11-17: 07:00:00 via INTRAVENOUS

## 2013-11-17 MED ORDER — IRBESARTAN 300 MG PO TABS
300.0000 mg | ORAL_TABLET | Freq: Every day | ORAL | Status: DC
  Administered 2013-11-17 – 2013-11-20 (×4): 300 mg via ORAL
  Filled 2013-11-17 (×4): qty 1

## 2013-11-17 MED ORDER — MORPHINE SULFATE 2 MG/ML IJ SOLN
1.0000 mg | INTRAMUSCULAR | Status: DC | PRN
  Administered 2013-11-17 – 2013-11-18 (×4): 2 mg via INTRAVENOUS
  Filled 2013-11-17 (×4): qty 1

## 2013-11-17 MED ORDER — OXYCODONE HCL 5 MG PO TABS
5.0000 mg | ORAL_TABLET | ORAL | Status: DC | PRN
  Administered 2013-11-18 – 2013-11-19 (×2): 5 mg via ORAL
  Administered 2013-11-20 (×2): 10 mg via ORAL
  Filled 2013-11-17: qty 1
  Filled 2013-11-17 (×2): qty 2
  Filled 2013-11-17 (×2): qty 1

## 2013-11-17 MED ORDER — INSULIN ASPART 100 UNIT/ML ~~LOC~~ SOLN
0.0000 [IU] | SUBCUTANEOUS | Status: DC
  Administered 2013-11-17: 7 [IU] via SUBCUTANEOUS
  Administered 2013-11-17: 11 [IU] via SUBCUTANEOUS
  Administered 2013-11-17 – 2013-11-18 (×4): 4 [IU] via SUBCUTANEOUS

## 2013-11-17 NOTE — Evaluation (Signed)
Physical Therapy Evaluation Patient Details Name: Brandon [name] MRN: [id number] DOB: 1961-09-05 Today's Date: 11/17/2013 Time: [id number] PT Time Calculation (min): 36 min  PT Assessment / Plan / Recommendation History of Present Illness  Pt found to have Acute infarct right temporal parietal lobe with sparing of the basal.  Clinical Impression  Pt's motor deficits appear to have resolved. Pt presents with mild L sided inattention. Pt with cognitive concerns such as impulsivity and poor attn span however spouse reports this to be normal and reports "I think he has adult ADHD or is OCD or something." Per spouse pt is functioning at baseline from cognitive stand point. Acute PT to follow to address stair negotiation and higher level balance.    PT Assessment  Patient needs continued PT services    Follow Up Recommendations  Supervision/Assistance - 24 hour;No PT follow up    Does the patient have the potential to tolerate intense rehabilitation      Barriers to Discharge Decreased caregiver support      Equipment Recommendations  None recommended by PT    Recommendations for Other Services     Frequency Min 3X/week    Precautions / Restrictions Precautions Precautions: None Restrictions Weight Bearing Restrictions: No   Pertinent Vitals/Pain 8/10 headache      Mobility  Bed Mobility Bed Mobility: Supine to Sit Supine to Sit: 3: Mod assist;With rails;HOB flat Details for Bed Mobility Assistance: pt initiatlly tried to exit bed on R hand side requiring max directional cues. pt reaching for assistance but suspect pt could've performed on own Transfers Transfers: Sit to Stand;Stand to Sit Sit to Stand: 4: Min assist;With upper extremity assist;From bed Stand to Sit: 4: Min assist;With upper extremity assist;To bed Details for Transfer Assistance: minA for safely due to pt impulsivity Ambulation/Gait Ambulation/Gait Assistance: 4: Min guard Ambulation Distance (Feet): 150  Feet Assistive device: None Ambulation/Gait Assistance Details: pt with no episodes of LOB however with mild L sided inattention Gait Pattern: Step-through pattern Gait velocity: wfl Modified Rankin (Stroke Patients Only) Pre-Morbid Rankin Score: No symptoms Modified Rankin: No significant disability    Exercises     PT Diagnosis: Difficulty walking  PT Problem List: Decreased balance;Decreased mobility;Decreased safety awareness;Impaired sensation PT Treatment Interventions: Stair training;Gait training;Functional mobility training;Balance training;Neuromuscular re-education;Therapeutic activities;Therapeutic exercise     PT Goals(Current goals can be found in the care plan section) Acute Rehab PT Goals Patient Stated Goal: "I want to eat" PT Goal Formulation: With patient Time For Goal Achievement: 11/23/13 Potential to Achieve Goals: Good Additional Goals Additional Goal #1: Pt to score >19 on DGI to indicate decreased falls risk  Visit Information  Last PT Received On: 11/17/13 Assistance Needed: +1 History of Present Illness: Pt found to have Acute infarct right temporal parietal lobe with sparing of the basal.       Prior [place] expects to be discharged to:: Private residence Living Arrangements: Spouse/significant other Available Help at Discharge: Available PRN/intermittently;Family Type of Home: Apartment Home Access: Stairs to enter Technical brewer of Steps: 24 Entrance Stairs-Rails: Right Home Layout: One level Home Equipment: None Prior Function Level of Independence: Independent Comments: on disability Communication Communication: No difficulties Dominant Hand: Right    Cognition  Cognition Arousal/Alertness: Awake/alert Behavior During Therapy: Restless;Anxious;Impulsive Overall Cognitive Status: History of cognitive impairments - at baseline (pt spouse reports pt to have poor attn and scattered thoughts. Pt  easily distracted. Spouse reports this to be normal.   Extremity/Trunk Assessment Upper Extremity Assessment  Upper Extremity Assessment: Overall WFL for tasks assessed;LUE deficits/detail LUE Sensation: decreased light touch;decreased proprioception Lower Extremity Assessment Lower Extremity Assessment: Overall WFL for tasks assessed;LLE deficits/detail LLE Sensation: decreased light touch;decreased proprioception Cervical / Trunk Assessment Cervical / Trunk Assessment: Normal   Balance Balance Balance Assessed: Yes Static Standing Balance Static Standing - Balance Support: No upper extremity supported;During functional activity (standing to urinate) Static Standing - Level of Assistance: 5: Stand by assistance Static Standing - Comment/# of Minutes: 3 min  End of Session PT - End of Session Equipment Utilized During Treatment: Gait belt Activity Tolerance: Patient tolerated treatment well Patient left: with family/visitor present (sitting EOB with SLP) Nurse Communication: Mobility status  GP     Kingsley Callander 11/17/2013, 3:43 PM  Kittie Plater, PT, DPT Pager #: [phone] Office #: [phone]

## 2013-11-17 NOTE — Progress Notes (Signed)
Stroke Team Rounding Parin Bonam is a 52 y.o. male with a history of diabetes, hypertension who presents with 3 days of headache and confusion. He reports noticing that he was having trouble seeing 3 days ago and reported headache that started around the same time. Since that time, his wife has noticed confusion, but attributed it to his ongoing headache. He also has had some nausea and vomiting. Today, his wife noticed that he had a mild left facial droop and therefore made him come in to the ED. He was not aware of his deficits, and after discussing with the wife, it is possible that she did not notice him on previous days.  In the ED he was noted to have left-sided facial and arm twitching. His wife reports that he had a similar episode last night.  LKW: 3 days ago  tpa given?: no, outside of window   Subjective: Complains of headache and left hip pain. Resting in bed. Fiancee at bedside.    Objective: Vital signs in last 24 hours: Filed Vitals:   11/17/13 0330 11/17/13 0800 11/17/13 0900 11/17/13 1300  BP: 127/74  113/67 133/81  Pulse: 87  53   Temp: 98 F (36.7 C) 98.8 F (37.1 C)  98.7 F (37.1 C)  TempSrc: Tympanic   Oral  Resp: 22  11 18   Height:      Weight:      SpO2: 98%  100% 100%    Temp (24hrs), Avg:98.5 F (36.9 C), Min:98 F (36.7 C), Max:98.8 F (37.1 C)    Intake/Output from previous day: 12/13 0701 - 12/14 0700 In: 497.9 [I.V.:497.9] Out: 775 [Urine:775]  GENERAL EXAM: Patient is in no distress; well developed, nourished and groomed  CARDIOVASCULAR: Regular rate and rhythm, no murmurs, no carotid bruits  NEUROLOGIC: MENTAL STATUS: awake, alert, oriented to person, place and time, normal attention and concentration, language fluent, comprehension intact, naming intact, fund of knowledge appropriate CRANIAL NERVE: pupils equal and reactive to light, DECREASED LEFT VISUAL FIELD (INFERIOR), extraocular muscles intact, no nystagmus, facial sensation  and strength DECR ON LEFT SIDE. hearing intact, palate elevates symmetrically, uvula midline, shoulder shrug symmetric, tongue midline. MILD DYSARTRHIA.  MOTOR: normal bulk and tone, full strength in the BUE, RLE; LLE 4/5.  SENSORY: DECR ON LEFT SIDE COORDINATION: finger-nose-finger, fine finger movements normal ON RIGHT; SLOW ON LEFT.  REFLEXES: deep tendon reflexes present and symmetric GAIT/STATION: IN BED  Lab Results:  Recent Labs  11/16/13 2100  WBC 6.1  HGB 16.0  HCT 43.4  PLT 222    BMET  Recent Labs  11/17/13 0835 11/17/13 1213  NA 140 141  K 3.8 3.6  CL 103 104  CO2 26 27  GLUCOSE 151* 138*  BUN 19 18  CREATININE 0.93 0.91  CALCIUM 8.8 8.8    LIPIDS:    Component Value Date/Time   CHOL 138 11/17/2013 0405   TRIG 190* 11/17/2013 0405   HDL 26* 11/17/2013 0405   CHOLHDL 5.3 11/17/2013 0405   VLDL 38 11/17/2013 0405   LDLCALC 74 11/17/2013 0405    HEMOGLOBIN A1C: Lab Results  Component Value Date   HGBA1C 12.8 07/17/2013     Studies/Results:  I reviewed images myself and agree with interpretation. -VRP  Ct Head Wo Contrast  11/16/2013   CLINICAL DATA:  Severe headache. Initially a code stroke but canceled. Stroke risk factors include diabetes and hypertension. History of renal cell cancer.  EXAM: CT HEAD WITHOUT CONTRAST  TECHNIQUE: Contiguous axial images  were obtained from the base of the skull through the vertex without contrast.  COMPARISON:  None  FINDINGS: There is a moderately large area of cytotoxic edema affecting much of the right temporal lobe anteriorly and laterally within the distribution of the right middle cerebral artery. This is consistent with an infarction probably 48-72 hr of age. There is no hemorrhagic transformation. There is no midline shift or significant displacement of the uncus.  No definite thrombus is seen in the right MCA or its branches. There is vascular calcification of a moderate degree affecting the carotid siphons.   No other areas of cortical infarction are seen. The ventricles are normal in size and midline. There is no extra-axial fluid or mass lesion. Calvarium is intact. Clear sinuses and mastoids.  IMPRESSION: Moderately large area of cytotoxic edema affecting the right temporal lobe consistent with an acute right MCA territory infarct without hemorrhage. Given the hypoattenuation and relatively well delineated area of infarction, this insult is likely 3-72 hr old.  Even though the code stroke was canceled, I personally communicated these findings to the ordering provider, who voiced understanding.   Electronically Signed   By: Rolla Flatten M.D.   On: 11/16/2013 21:23   Mr Brain Wo Contrast  11/17/2013   CLINICAL DATA:  Stroke.  EXAM: MRI HEAD WITHOUT CONTRAST  MRA HEAD WITHOUT CONTRAST  TECHNIQUE: Multiplanar, multiecho pulse sequences of the brain and surrounding structures were obtained without intravenous contrast. Angiographic images of the head were obtained using MRA technique without contrast.  COMPARISON:  CT 11/16/2013  FINDINGS: MRI HEAD FINDINGS  Acute infarct right MCA territory. This involves the inferior and lateral temporal lobe extending into the right parietal lobe. This involves approximately 1/3 of the right MCA territory. There is sparing of the basal ganglia. No acute infarct on the left or in the posterior fossa. There is brain edema in the infarct with mild local mass effect. No midline shift.  Negative for hydrocephalus. Small subcortical white matter hyperintensities bilaterally compatible with minimal chronic microvascular ischemic change. Negative for intracranial hemorrhage or mass lesion.  MRA HEAD FINDINGS  Both vertebral arteries are patent to the basilar. Right posterior inferior cerebellar artery is patent. Left posterior inferior cerebellar artery not visualized. The basilar is patent. Superior cerebellar and posterior cerebral arteries are patent bilaterally. Fetal origin of the  right posterior cerebral artery with hypoplastic right P1 segment. Left posterior communicating artery is patent.  The internal carotid artery is patent bilaterally. Left middle cerebral artery widely patent. Both anterior cerebral arteries are patent.  Right M1 segment is patent. There is an early occluded branch of the right middle cerebral artery corresponding to the area of acute infarct. This shows abrupt occlusion proximally. The parietal branch of the right middle cerebral artery is patent.  IMPRESSION: Acute infarct right temporal parietal lobe with sparing of the basal ganglia.  Occlusion of the anterior branch of the right middle cerebral artery which may be due to an embolus.   Electronically Signed   By: Franchot Gallo M.D.   On: 11/17/2013 12:39   Mr Jodene Nam Head/brain Wo Cm  11/17/2013   CLINICAL DATA:  Stroke.  EXAM: MRI HEAD WITHOUT CONTRAST  MRA HEAD WITHOUT CONTRAST  TECHNIQUE: Multiplanar, multiecho pulse sequences of the brain and surrounding structures were obtained without intravenous contrast. Angiographic images of the head were obtained using MRA technique without contrast.  COMPARISON:  CT 11/16/2013  FINDINGS: MRI HEAD FINDINGS  Acute infarct right MCA territory. This  involves the inferior and lateral temporal lobe extending into the right parietal lobe. This involves approximately 1/3 of the right MCA territory. There is sparing of the basal ganglia. No acute infarct on the left or in the posterior fossa. There is brain edema in the infarct with mild local mass effect. No midline shift.  Negative for hydrocephalus. Small subcortical white matter hyperintensities bilaterally compatible with minimal chronic microvascular ischemic change. Negative for intracranial hemorrhage or mass lesion.  MRA HEAD FINDINGS  Both vertebral arteries are patent to the basilar. Right posterior inferior cerebellar artery is patent. Left posterior inferior cerebellar artery not visualized. The basilar is  patent. Superior cerebellar and posterior cerebral arteries are patent bilaterally. Fetal origin of the right posterior cerebral artery with hypoplastic right P1 segment. Left posterior communicating artery is patent.  The internal carotid artery is patent bilaterally. Left middle cerebral artery widely patent. Both anterior cerebral arteries are patent.  Right M1 segment is patent. There is an early occluded branch of the right middle cerebral artery corresponding to the area of acute infarct. This shows abrupt occlusion proximally. The parietal branch of the right middle cerebral artery is patent.  IMPRESSION: Acute infarct right temporal parietal lobe with sparing of the basal ganglia.  Occlusion of the anterior branch of the right middle cerebral artery which may be due to an embolus.   Electronically Signed   By: Franchot Gallo M.D.   On: 11/17/2013 12:39   11/17/13 Carotid u/s: Preliminary report: Bilateral: 1-39% ICA stenosis. Vertebral artery flow is antegrade.   TTE - result pending  Medications:  . aspirin  325 mg Oral Daily  . atorvastatin  40 mg Oral Daily  . irbesartan  300 mg Oral Daily   And  . hydrochlorothiazide  12.5 mg Oral Daily  . [START ON 11/18/2013] influenza vac split quadrivalent PF  0.5 mL Intramuscular Tomorrow-1000  . insulin aspart  0-20 Units Subcutaneous Q4H  . levETIRAcetam  500 mg Intravenous Q12H  . pregabalin  75 mg Oral BID   . sodium chloride 75 mL/hr at 11/17/13 0130     Assessment: 52 y.o. male with right MCA infarct (parietal and temporal), with resultant left visual, sensory and motor deficits on the left side. Uncontrolled risk factors, especially diabetes (A1c 12.8).   Stroke Risk Factors - diabetes mellitus, hyperlipidemia and hypertension   LOS: 1 day   Plan: - check TTE results - Risk factor modification (goal SBP < 180, goal LDL < 70, goal A1c < 6) - Permissive HTN x 24-48 hours post stroke, then gradually reduce -  Antiplatelet/Anticoagulation therapy: aspirin 325mg   - Maintain euvolemia, euglycemia, euthermia - Telemetry - Frequent neuro checks - PT consult, OT consult, Speech consult - continue levetiracetam for seizure control   Penni Bombard, MD [id number], [id number] PM Certified in Neurology, Neurophysiology and Neuroimaging [facility] - Stroke Team  Please refer to [email or web address] for on-call Stroke MD

## 2013-11-17 NOTE — Evaluation (Signed)
Speech Language Pathology Evaluation Patient Details Name: Brandon [name] MRN: [id number] DOB: [birth year] Today's Date: 11/17/2013 Time: 1545-1600 SLP Time Calculation (min): 15 min  Problem List:  Patient Active Problem List   Diagnosis Date Noted  . Acute ischemic stroke 11/16/2013  . Focal seizure 11/16/2013  . Stroke 11/16/2013  . Type 2 diabetes mellitus with hyperosmolar nonketotic hyperglycemia 11/16/2013  . Chronic pain syndrome 06/27/2013  . Renal cell carcinoma 03/15/2012  . Essential hypertension, benign 03/15/2012  . Type II or unspecified type diabetes mellitus without mention of complication, uncontrolled 03/15/2012  . AKI (acute kidney injury) 12/30/2011  . Renal cell cancer 12/30/2011  . Diverticulitis-history of 12/30/2011  . DM-with prior h/o AKI 12/30/2011  . HTN (hypertension) 12/30/2011  . Abdominal pain 12/30/2011   Past Medical History:  Past Medical History  Diagnosis Date  . Hypertension   . Diabetes mellitus     DKA sept admission  . Diverticulitis     s/p micorperforation Sept 2012-managed conservatively by Gen surgery  . Kidney tumor 09/2011    Renal cell CA  . Wears glasses   . ED (erectile dysfunction)   . 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 Kidney surgery      ablation of renal cell CA - 12/28, prior one was October 2012-Dr. Kathlene Cote   HPI:  Brandon [name] is a 52 y.o. male with a history of diabetes, hypertension who presents with 3 days of headache and confusion. He reports noticing that he was having trouble seeing 3 days ago and reported headache that started around the same time. Since that time, his wife has noticed confusion, but attributed it to his ongoing headache. He also has had some nausea and vomiting. Today, his wife noticed that he had a mild left facial droop and therefore made him come in to the ED. He was  not aware of his deficits, and after discussing with the wife, it is possible that she did not notice him on previous days. 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  Spouse present during evaluation and reports patient was independent with all ADL's and was responsible for cooking, shopping, medication and financial management.  Spouse reports patient "always impulsive with decreased attention span" prior to current episode.     Assessment / Plan / Recommendation Clinical Impression  Cognitive Linguistic Evaluation completed per Stroke Protocol but limited due to lack of cooperation of patient and reported pain.  Moderate cognitive deficits indicated in areas of emergent and anticipatory awareness of current physical deficits , problem solving, and sustained attention. Secondary behavior of impulsivty noted during completion of basic ADL's (observed brushing teeth and observed during transfer to bathroom).  Cognitive deficits present but spouse reports cognition is baseline.  No further Skilled ST warranted to address cognitive deficits in acute care setting as receiving necessary supervision and assist.  Recommend 24 hour supervision at next level of care to ensure safety with completion of complex ADL's.         Follow Up Recommendations  24  Hour supervision with complex ADL's         SLP Goals     SLP Evaluation Prior Functioning  Cognitive/Linguistic Baseline: Baseline deficits Baseline deficit details: Easily distracted, impulsive  Type of Home: Apartment  Lives With: Spouse Available Help at Discharge: Available PRN/intermittently Vocation: On  disability   Cognition  Overall Cognitive Status: Impaired/Different from baseline Arousal/Alertness: Awake/alert Orientation Level: Oriented X4 Attention: Sustained Sustained Attention: Impaired Sustained Attention Impairment: Functional basic;Functional complex Awareness: Impaired Awareness  Impairment: Emergent impairment;Anticipatory impairment Problem Solving: Impaired Problem Solving Impairment: Functional basic;Functional complex Executive Function: Self Monitoring Initiating: Impaired Initiating Impairment: Functional basic Self Monitoring: Impaired Self Monitoring Impairment: Functional basic Safety/Judgment: Impaired    Comprehension  Auditory Comprehension Overall Auditory Comprehension: Appears within functional limits for tasks assessed Visual Recognition/Discrimination Discrimination: Not tested Reading Comprehension Reading Status: Not tested    Expression Expression Primary Mode of Expression: Verbal Verbal Expression Overall Verbal Expression: Appears within functional limits for tasks assessed Written Expression Dominant Hand: Right Written Expression: Not tested   Oral / Motor Oral Motor/Sensory Function Overall Oral Motor/Sensory Function: Appears within functional limits for tasks assessed Motor Speech Overall Motor Speech: Appears within functional limits for tasks assessed   Brandon [name], [place] [facility] 11/17/2013, 4:09 PM

## 2013-11-17 NOTE — Progress Notes (Signed)
VASCULAR LAB PRELIMINARY  PRELIMINARY  PRELIMINARY  PRELIMINARY  Carotid duplex completed.    Preliminary report:  Bilateral:  1-39% ICA stenosis.  Vertebral artery flow is antegrade.     Tullio Chausse, [place], RVS 11/17/2013, 10:24 AM

## 2013-11-17 NOTE — Progress Notes (Addendum)
[facility] Progress Note [place] TEAM 1 - Stepdown/ICU TEAM   Irven Brockmann [id number] DOB: 06/27/1961 DOA: 11/16/2013 PCP: Wyatt Haste, MD  Admit HPI / Brief Narrative: 52 y.o. male who developed headache and difficulty with vision 3 days prior to his admit. His wife noted increased confusion and attributed it to his ongoing headache. He also had N/V. On the day of his admission his wife noted a mild facial droop and made him come to ED.   While in ED patient noted to have L sided facial and arm twitching.  His wife reported similar episodes the night prior to his presentation.   HPI/Subjective: The patient is alert oriented and conversant.  He asked me about obtaining disability from work.  He complains of severe bifrontal headache and tingling and numbness in his left arm and leg.  He denies chest pain or shortness of breath.  Assessment/Plan:  Acute R temporal/parietal  lobe CVA (inferior division R MCA)  Neurology is following - workup has been initiated - n.p.o. until evaluated by speech pathology  Newly diagnosed focal [place] to be due to above - continue antiseizure medication - EEG pending  HTN Blood pressure currently well controlled - avoid overcorrection in the acute setting  Severely uncontrolled DM CBG much improved - stop insulin gtt - follow CBG with sliding scale insulin  Hx of B renal cell CA S/p cyroablation   HLD Already on statin w/ LDL near goal   Code Status: FULL Family Communication:  Spoke with patient and wife at bedside Disposition Plan: SDU - anticipate transfer to neurology floor in a.m.  Consultants: Neurology   Procedures: Carotid dopplers - 12/14 - Bilateral: 1-39% ICA stenosis. Vertebral artery flow is antegrade  Antibiotics: none  DVT prophylaxis: SCDs  Objective: Blood pressure 113/67, pulse 53, temperature 98.8 F (37.1 C), temperature source Tympanic, resp. rate 11, height 6\' 1"  (1.854 m), weight 133  kg (293 lb 3.4 oz), SpO2 100.00%.  Intake/Output Summary (Last 24 hours) at 11/17/13 1109 Last data filed at 11/17/13 0500  Gross per 24 hour  Intake    300 ml  Output    775 ml  Net   -475 ml   Exam: General: No acute respiratory distress Lungs: Clear to auscultation bilaterally without wheezes or crackles Cardiovascular: Regular rate and rhythm without murmur gallop or rub normal S1 and S2 Abdomen: Obese, nontender, nondistended, soft, bowel sounds positive, no rebound, no ascites, no appreciable mass Extremities: No significant cyanosis, clubbing, or edema bilateral lower extremities  Data Reviewed: Basic Metabolic Panel:  Recent Labs Lab 11/16/13 2100 11/16/13 2343 11/17/13 0405 11/17/13 0835  NA 129* 133* 137 140  K 4.5 4.1 4.3 3.8  CL 88* 98 99 103  CO2 27 25 28 26   GLUCOSE 733* 561* 302* 151*  BUN 24* 22 19 19   CREATININE 1.17 1.06 0.97 0.93  CALCIUM 9.3 8.3* 8.9 8.8   Liver Function Tests:  Recent Labs Lab 11/16/13 2100  AST 30  ALT 31  ALKPHOS 172*  BILITOT 1.6*  PROT 8.6*  ALBUMIN 4.6   CBC:  Recent Labs Lab 11/16/13 2100  WBC 6.1  NEUTROABS 4.1  HGB 16.0  HCT 43.4  MCV 84.4  PLT 222   CBG:  Recent Labs Lab 11/17/13 0427 11/17/13 0500 11/17/13 0609 11/17/13 0704 11/17/13 0821  GLUCAP 298* 283* 151* 105* 150*    Recent Results (from the past 240 hour(s))  MRSA PCR SCREENING     Status: None  Collection Time    11/17/13 12:50 AM      Result Value Range Status   MRSA by PCR NEGATIVE  NEGATIVE Final   Comment:            The GeneXpert MRSA Assay (FDA     approved for NASAL specimens     only), is one component of a     comprehensive MRSA colonization     surveillance program. It is not     intended to diagnose MRSA     infection nor to guide or     monitor treatment for     MRSA infections.     Studies:  Recent x-ray studies have been reviewed in detail by the Attending Physician  Scheduled Meds:  Scheduled Meds: .  aspirin  325 mg Oral Daily  . atorvastatin  40 mg Oral Daily  . irbesartan  300 mg Oral Daily   And  . hydrochlorothiazide  12.5 mg Oral Daily  . [START ON 11/18/2013] influenza vac split quadrivalent PF  0.5 mL Intramuscular Tomorrow-1000  . levETIRAcetam  500 mg Intravenous Q12H  . pregabalin  75 mg Oral BID    Time spent on care of this patient: 35 mins   [facility] Office  [phone] Pager - Text Page per Shea Evans as per below:  On-Call/Text Page:      [email or web address]      password [email or web address]  If 7PM-7AM, please contact night-coverage [email or web address] Password [email or web address] 11/17/2013, 11:09 AM   LOS: 1 day

## 2013-11-17 NOTE — ED Notes (Signed)
Patient having seizure like behavior, patient suctioned, put on NRB, head protected.  EDP called and at bedside.  Seizure activity lasted less than 20 seconds, patient alert immediately afterwards.

## 2013-11-17 NOTE — Evaluation (Signed)
Clinical/Bedside Swallow Evaluation Patient Details  Name: Brandon [name] MRN: [id number] Date of Birth: 1961-01-12  Today's Date: 11/17/2013 Time: [id number] SLP Time Calculation (min): 25 min  Past Medical History:  Past Medical History  Diagnosis Date  . Hypertension   . Diabetes mellitus     DKA sept admission  . Diverticulitis     s/p micorperforation Sept 2012-managed conservatively by Gen surgery  . Kidney tumor 09/2011    Renal cell CA  . Wears glasses   . ED (erectile dysfunction)   . 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 Kidney surgery      ablation of renal cell CA - 12/28, prior one was October 2012-Dr. Kathlene Cote   HPI:  Brandon [name] is a 52 y.o. male with a history of diabetes, hypertension who presents with 3 days of headache and confusion. He reports noticing that he was having trouble seeing 3 days ago and reported headache that started around the same time. Since that time, his wife has noticed confusion, but attributed it to his ongoing headache. He also has had some nausea and vomiting. Today, his wife noticed that he had a mild left facial droop and therefore made him come in to the ED. He was not aware of his deficits, and after discussing with the wife, it is possible that she did not notice him on previous days. 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 BSE ordered per Stroke Protocol.       Assessment / Plan / Recommendation Clinical Impression  BSE completed following PT evaluation.  No outward clinical s/s of aspiration noted with PO trials but trials limited due to lack of cooperation of patient.   Recommend to proceed with regular consistency and thin liquids with full supervision initially due to noted decreased safety awareness.  Oropharyngeal swallow appears functional but recommend continued ST  in acute care setting for diet tolerance due to current episode of stroke and seizure activity to ensure safety.      Aspiration Risk  Mild    Diet Recommendation Regular;Thin liquid   Liquid Administration via: Cup;Straw Medication Administration: Whole meds with liquid Supervision: Patient able to self feed;Full supervision/cueing for compensatory strategies Compensations: Slow rate;Small sips/bites Postural Changes and/or Swallow Maneuvers: Seated upright 90 degrees;Upright 30-60 min after meal    Other  Recommendations Oral Care Recommendations: Oral care Q4 per protocol Other Recommendations: Clarify dietary restrictions   Follow Up Recommendations   (TBD)    Frequency and Duration min 1 x/week  1 week       SLP Swallow Goals  See Care Plans for goals    Swallow Study Prior Functional Status  Type of Home: Apartment Available Help at Discharge: Available PRN/intermittently;Family    General Date of Onset: 11/16/13 HPI: Brandon [name] is a 52 y.o. male with a history of diabetes, hypertension who presents with 3 days of headache and confusion. He reports noticing that he was having trouble seeing 3 days ago and reported headache that started around the same time. Since that time, his wife has noticed confusion, but attributed it to his ongoing headache. He also has had some nausea and vomiting. Today, his wife noticed that he had a mild left facial droop and therefore made him come in to the ED. He was not aware of his deficits, and after  discussing with the wife, it is possible that she did not notice him on previous days.   Type of Study: Bedside swallow evaluation Previous Swallow Assessment: N/A Diet Prior to this Study: NPO Temperature Spikes Noted: No Respiratory Status: Room air History of Recent Intubation: No Behavior/Cognition: Alert;Agitated;Impulsive;Requires cueing;Distractible Oral Cavity - Dentition: Adequate natural dentition Self-Feeding Abilities: Able  to feed self Patient Positioning:  (EOB) Baseline Vocal Quality: Clear Volitional Cough: Strong Volitional Swallow: Able to elicit    Oral/Motor/Sensory Function Overall Oral Motor/Sensory Function: Appears within functional limits for tasks assessed   Ice Chips Ice chips: Not tested   Thin Liquid Thin Liquid: Within functional limits Presentation: Cup;Straw    Nectar Thick Nectar Thick Liquid: Not tested   Honey Thick Honey Thick Liquid: Not tested   Puree Puree: Within functional limits Presentation: Self Fed;Spoon   Solid   GO    Solid: Within functional limits Presentation: Self Fed Other Comments: limited due to patient declining further trials       Brandon [name], [place] [facility] 11/17/2013,3:48 PM

## 2013-11-18 ENCOUNTER — Inpatient Hospital Stay (HOSPITAL_COMMUNITY): Payer: BC Managed Care – PPO

## 2013-11-18 DIAGNOSIS — I517 Cardiomegaly: Secondary | ICD-10-CM

## 2013-11-18 LAB — GLUCOSE, CAPILLARY: Glucose-Capillary: >600 mg/dL (ref 70–99)

## 2013-11-18 MED ORDER — DEXAMETHASONE 2 MG PO TABS
2.0000 mg | ORAL_TABLET | Freq: Three times a day (TID) | ORAL | Status: AC
  Administered 2013-11-18 – 2013-11-19 (×3): 2 mg via ORAL
  Filled 2013-11-18 (×6): qty 1

## 2013-11-18 MED ORDER — LEVETIRACETAM 500 MG PO TABS
500.0000 mg | ORAL_TABLET | Freq: Two times a day (BID) | ORAL | Status: DC
  Administered 2013-11-18 – 2013-11-20 (×4): 500 mg via ORAL
  Filled 2013-11-18 (×7): qty 1

## 2013-11-18 MED ORDER — INSULIN ASPART 100 UNIT/ML ~~LOC~~ SOLN
0.0000 [IU] | Freq: Three times a day (TID) | SUBCUTANEOUS | Status: DC
  Administered 2013-11-18: 11 [IU] via SUBCUTANEOUS
  Administered 2013-11-18: 20 [IU] via SUBCUTANEOUS
  Administered 2013-11-19: 15 [IU] via SUBCUTANEOUS
  Administered 2013-11-19 (×2): 7 [IU] via SUBCUTANEOUS
  Administered 2013-11-20: 3 [IU] via SUBCUTANEOUS

## 2013-11-18 MED ORDER — INSULIN ASPART PROT & ASPART (70-30 MIX) 100 UNIT/ML ~~LOC~~ SUSP
45.0000 [IU] | Freq: Two times a day (BID) | SUBCUTANEOUS | Status: DC
  Filled 2013-11-18: qty 10

## 2013-11-18 MED ORDER — INSULIN ASPART 100 UNIT/ML ~~LOC~~ SOLN
5.0000 [IU] | Freq: Once | SUBCUTANEOUS | Status: AC
  Administered 2013-11-18: 5 [IU] via SUBCUTANEOUS

## 2013-11-18 MED ORDER — INSULIN ASPART 100 UNIT/ML ~~LOC~~ SOLN
8.0000 [IU] | Freq: Three times a day (TID) | SUBCUTANEOUS | Status: DC
  Administered 2013-11-19: 8 [IU] via SUBCUTANEOUS

## 2013-11-18 MED ORDER — SODIUM CHLORIDE 0.9 % IV SOLN: INTRAVENOUS | Status: DC

## 2013-11-18 MED ORDER — INSULIN ASPART PROT & ASPART (70-30 MIX) 100 UNIT/ML ~~LOC~~ SUSP
50.0000 [IU] | Freq: Two times a day (BID) | SUBCUTANEOUS | Status: DC
  Administered 2013-11-19: 50 [IU] via SUBCUTANEOUS
  Filled 2013-11-18: qty 10

## 2013-11-18 NOTE — Progress Notes (Signed)
TEE scheduled with Dr. Stanford Breed tomorrow at [place] [facility]

## 2013-11-18 NOTE — Procedures (Signed)
ELECTROENCEPHALOGRAM REPORT   Patient: Brandon [name]       Room #: [id number] EEG No. ID: [id number] Age: 52 y.o.        Sex: male Referring Physician: Thereasa Solo Report Date:  11/18/2013        Interpreting Physician: Anthony Sar  History: Brandon [name] is an 52 y.o. male admitted following 3 days of headache and confusion. Left focal motor facial twitching was reported. MRI showed right MCA territory acute stroke.  Indications for study:  Rule out focal seizure disorder  Technique: This is an 18 channel routine scalp EEG performed at the bedside with bipolar and monopolar montages arranged in accordance to the international 10/20 system of electrode placement.   Description: This EEG recording was performed during sleep, for the most part. Background activity consisted of mixed irregular delta and theta activity diffusely and symmetrically. Normal vertex waves, sleep spindles and arousal responses were recorded during stage II of sleep and were symmetrical. During brief period of wakefulness diffuse low amplitude 20-25 Hz beta activity was recorded. Photic stimulation was not performed. Hyperventilation was not performed. No epileptiform discharges were recorded. There was no abnormal slowing of cerebral activity.  Interpretation: This is a normal EEG recording with no signs of focal seizure disorder demonstrated.   Rush Farmer M.D. [facility] [phone]

## 2013-11-18 NOTE — Evaluation (Signed)
Occupational Therapy Evaluation Patient Details Name: Brandon [name] MRN: [id number] DOB: [birth year] Today's Date: 11/18/2013 Time: [id number] OT Time Calculation (min): 28 min  OT Assessment / Plan / Recommendation History of present illness Pt found to have Acute infarct right temporal parietal lobe with sparing of the basal.   Clinical Impression   Pt admitted with above.  He presents to OT with the below listed deficits.  He demonstrates a dense Lt. Homonomous hemianopsia.  He will benefit from OT to maximize safety and independence with BADLs.  Recommend OPOT and 24 hour supervision at discharge.     OT Assessment  Patient needs continued OT Services    Follow Up Recommendations  Outpatient OT;Supervision/Assistance - 24 hour    Barriers to Discharge      Equipment Recommendations  Tub/shower seat    Recommendations for Other Services    Frequency  Min 3X/week    Precautions / Restrictions Precautions Precautions: None   Pertinent Vitals/Pain     ADL  Eating/Feeding: Set up Where Assessed - Eating/Feeding: Chair Grooming: Wash/dry hands;Wash/dry face;Brushing hair;Minimal assistance Where Assessed - Grooming: Supported standing Upper Body Bathing: Minimal assistance Where Assessed - Upper Body Bathing: Unsupported sitting Lower Body Bathing: Minimal assistance Where Assessed - Lower Body Bathing: Unsupported sit to stand Upper Body Dressing: Minimal assistance Where Assessed - Upper Body Dressing: Unsupported sitting Lower Body Dressing: Moderate assistance Where Assessed - Lower Body Dressing: Unsupported sit to stand Toilet Transfer: Minimal assistance Toilet Transfer Method: Sit to stand;Stand pivot Toilet Transfer Equipment: Comfort height toilet Toileting - Clothing Manipulation and Hygiene: Minimal assistance Where Assessed - Toileting Clothing Manipulation and Hygiene: Standing Transfers/Ambulation Related to ADLs: min guard assist    OT Diagnosis:  Generalized weakness;Disturbance of vision;Cognitive deficits  OT Problem List: Impaired balance (sitting and/or standing);Impaired vision/perception;Decreased coordination;Decreased cognition;Decreased safety awareness OT Treatment Interventions: Self-care/ADL training;DME and/or AE instruction;Therapeutic activities;Visual/perceptual remediation/compensation;Patient/family education;Balance training   OT Goals(Current goals can be found in the care plan section) Acute Rehab OT Goals Patient Stated Goal: To get his vision back  OT Goal Formulation: With patient/family Time For Goal Achievement: 12/02/13 Potential to Achieve Goals: Good ADL Goals Pt Will Perform Grooming: with supervision;standing Pt Will Perform Upper Body Bathing: with supervision;sitting Pt Will Perform Lower Body Bathing: with supervision;sit to/from stand Pt Will Perform Upper Body Dressing: with supervision;sitting Pt Will Perform Lower Body Dressing: with supervision;sit to/from stand Pt Will Transfer to Toilet: with supervision;ambulating;regular height toilet;grab bars Pt Will Perform Toileting - Clothing Manipulation and hygiene: with supervision;sit to/from stand Pt Will Perform Tub/Shower Transfer: with min guard assist;ambulating;shower seat Additional ADL Goal #1: Pt will locate items on Lt. with min cues consistently   Visit Information  Last OT Received On: 11/18/13 Assistance Needed: +1 History of Present Illness: Pt found to have Acute infarct right temporal parietal lobe with sparing of the basal.       Prior Aneta expects to be discharged to:: Private residence Living Arrangements: Spouse/significant other Available Help at Discharge: Available PRN/intermittently;Family Type of Home: Apartment Home Access: Stairs to enter [employer] of Steps: 24 Entrance Stairs-Rails: Right Home Layout: One level Home Equipment: None Additional Comments:  Wife works weekends.   Prior Function Level of Independence: Independent Comments: Pt has not worked since having kidney surgeries, but is not on disability.  He did drive PTA Communication Communication: No difficulties Dominant Hand: Right         Vision/Perception Vision - History Baseline Vision:  No visual deficits Patient Visual Report: Peripheral vision impairment;Central vision impairment Vision - Assessment Eye Alignment: Within Functional Limits Vision Assessment: Vision tested Ocular Range of Motion: Within Functional Limits Tracking/Visual Pursuits: Able to track stimulus in all quads without difficulty Visual Fields: Left homonymous hemianopsia Additional Comments: Unable to accurately asses if pt with macular involvement or if he has macular sparing.  Explained in detail to pt and wife extent of pt's visual deficits and impact on function.  Unable to work on compensation activities with him as EEG arrived. Perception Perception: Impaired Inattention/Neglect: Does not attend to left visual field (consistently )   Cognition  Cognition Arousal/Alertness: Awake/alert Behavior During Therapy: Restless;Anxious;Impulsive Overall Cognitive Status: Impaired/Different from baseline Area of Impairment: Attention General Comments: wife reports that pt with poor attention PTA and that he is at baseline, but unable to determine at this time, if he has deficits with higher level cognitive functions.  Pt self distracts easily requiring mod verbal cues to stay on task.  Pt. very childlike with his behaviors    Extremity/Trunk Assessment Upper Extremity Assessment Upper Extremity Assessment: RUE deficits/detail RUE Coordination: decreased fine motor LUE Sensation: decreased proprioception;decreased light touch Lower Extremity Assessment Lower Extremity Assessment: Defer to PT evaluation Cervical / Trunk Assessment Cervical / Trunk Assessment: Normal     Mobility Bed  Mobility Bed Mobility: Not assessed Transfers Transfers: Sit to Stand;Stand to Sit Sit to Stand: 4: Min assist;With upper extremity assist;From chair/3-in-1 Stand to Sit: 4: Min assist;With upper extremity assist;To chair/3-in-1 Details for Transfer Assistance: min A for safety      Exercise     Balance     End of Session OT - End of Session Activity Tolerance: Patient tolerated treatment well Patient left: in chair;with call bell/phone within reach;with family/visitor present Nurse Communication: Mobility status  GO     Alexus Galka M 11/18/2013, 4:37 PM

## 2013-11-18 NOTE — Progress Notes (Signed)
EEG completed; results pending.    

## 2013-11-18 NOTE — Progress Notes (Signed)
[facility] Progress Note Brandon [name] TEAM 1 - Stepdown/ICU TEAM   Brandon [name] [id number] DOB: 1961/09/15 DOA: 11/16/2013 PCP: Wyatt Haste, MD  Admit HPI / Brief Narrative: 52 y.o. male who developed headache and difficulty with vision 3 days prior to his admit. His wife noted increased confusion and attributed it to his ongoing headache. He also had N/V. On the day of his admission his wife noted a mild facial droop and made him come to ED.   While in ED patient noted to have L sided facial and arm twitching.  His wife reported similar episodes the night prior to his presentation.   HPI/Subjective: Alert but seems depressed. Wanting to know when we feel he can return to work. Still with HA, and reported pain in L arm and leg.    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)  -Neurology is following  - workup has been initiated  -SLP has ok'd diet -cont PT/OT -TEE planned for 12/16 at 10 am  Severe headache -likely due to recent stroke -trial low dose Decadron  Newly diagnosed focal Sz - Felt to be due to above  - continue antiseizure medication as per Neuro  - EEG pending  HTN -Blood pressure currently well controlled  - avoid overcorrection in the acute setting  Severely uncontrolled DM -CBG not yet at goal  - follow CBG with sliding scale insulin - adjust scheduled insulin dosing  - pt was not checking CBG regularly at home  Hx of B renal cell CA -S/p cryoablation   HLD -Already on statin w/ LDL near goal   Code Status: FULL Family Communication:  Spoke with patient and wife at bedside Disposition Plan:  transfer to neurology floor   Consultants: Neurology   Procedures: Carotid dopplers - 12/14 - Bilateral: 1-39% ICA stenosis. Vertebral artery flow is antegrade  Antibiotics: none  DVT prophylaxis: SCDs  Objective: Blood pressure 119/66, pulse 58, temperature 97.4 F (36.3 C), temperature source  Oral, resp. rate 18, height 6\' 1"  (1.854 m), weight 293 lb 3.4 oz (133 kg), SpO2 100.00%.  Intake/Output Summary (Last 24 hours) at 11/18/13 1306 Last data filed at 11/18/13 0600  Gross per 24 hour  Intake   2005 ml  Output    550 ml  Net   1455 ml   Exam: General: No acute respiratory distress Lungs: Clear to auscultation bilaterally without wheezes or crackles Cardiovascular: Regular rate and rhythm without murmur gallop or rub normal S1 and S2 Abdomen: Obese, nontender, nondistended, soft, bowel sounds positive, no rebound, no ascites, no appreciable mass Extremities: No significant cyanosis, clubbing, or edema bilateral lower extremities Neurological: Strength decreased on left side  Data Reviewed: Basic Metabolic Panel:  Recent Labs Lab 11/16/13 2100 11/16/13 2343 11/17/13 0405 11/17/13 0835 11/17/13 1213  NA 129* 133* 137 140 141  K 4.5 4.1 4.3 3.8 3.6  CL 88* 98 99 103 104  CO2 27 25 28 26 27   GLUCOSE 733* 561* 302* 151* 138*  BUN 24* 22 19 19 18   CREATININE 1.17 1.06 0.97 0.93 0.91  CALCIUM 9.3 8.3* 8.9 8.8 8.8   Liver Function Tests:  Recent Labs Lab 11/16/13 2100  AST 30  ALT 31  ALKPHOS 172*  BILITOT 1.6*  PROT 8.6*  ALBUMIN 4.6   CBC:  Recent Labs Lab 11/16/13 2100  WBC 6.1  NEUTROABS 4.1  HGB 16.0  HCT 43.4  MCV 84.4  PLT 222   CBG:  Recent Labs Lab  11/17/13 2005 11/17/13 2338 11/18/13 0350 11/18/13 0741 11/18/13 1113  GLUCAP 243* 298* 171* 174* 285*    Recent Results (from the past 240 hour(s))  MRSA PCR SCREENING     Status: None   Collection Time    11/17/13 12:50 AM      Result Value Range Status   MRSA by PCR NEGATIVE  NEGATIVE Final   Comment:            The GeneXpert MRSA Assay (FDA     approved for NASAL specimens     only), is one component of a     comprehensive MRSA colonization     surveillance program. It is not     intended to diagnose MRSA     infection nor to guide or     monitor treatment for     MRSA  infections.     Studies:  Recent x-ray studies have been reviewed in detail by the Attending Physician  Scheduled Meds:  Scheduled Meds: . aspirin  325 mg Oral Daily  . atorvastatin  40 mg Oral Daily  . irbesartan  300 mg Oral Daily   And  . hydrochlorothiazide  12.5 mg Oral Daily  . insulin aspart  0-20 Units Subcutaneous TID WC  . insulin aspart protamine- aspart  45 Units Subcutaneous BID WC  . levETIRAcetam  500 mg Oral Q12H  . pregabalin  75 mg Oral BID    Time spent on care of this patient: 35 mins   ELLIS,ALLISON L. ANP  [facility] Office  [phone] Pager - Text Page per Shea Evans as per below:  On-Call/Text Page:      [email or web address]      password [email or web address]  If 7PM-7AM, please contact night-coverage [email or web address] Password [email or web address] 11/18/2013, 1:06 PM   LOS: 2 days    I have personally examined this patient and reviewed the entire database. I have reviewed the above note, made any necessary editorial changes, and agree with its content.  Cherene Altes, MD [facility]

## 2013-11-18 NOTE — Progress Notes (Addendum)
Attempted to talk with patient about his diabetes.  Patient would not open his eyes, but answered a few questions.  Sees Dr. Glade Lloyd as PCP; last saw him about a month ago. HgbA1C is 12.2%.  Takes 70/30 mixed insulin 45 units twice a day at home.  Patient states that he checks blood sugars at home, but wife states that he does not.  Was unable to talk further with patient due to physician coming in to talk with patient.  Seemed very sleepy and with eyes closed when speaking quietly.  Will continue to follow while in hospital.   Harvel Ricks RN BSN CDE

## 2013-11-18 NOTE — Progress Notes (Signed)
Patient with frequent episodes nonsustained bradycardia 43-50's, is asleep and asymptomatic. MD made aware. Will continue to monitor.

## 2013-11-18 NOTE — Progress Notes (Signed)
Stroke Team Progress Note  HISTORY Brandon [name] is a 52 y.o. male with a history of diabetes, hypertension who presents with 3 days of headache and confusion. He reports noticing that he was having trouble seeing 3 days ago and reported headache that started around the same time. Since that time, his wife has noticed confusion, but attributed it to his ongoing headache. He also has had some nausea and vomiting. Today (11/16/2013), his wife noticed that he had a mild left facial droop and therefore made him come in to the ED. He was not aware of his deficits, and after discussing with the wife, it is possible that she did not notice him on previous days. In the ED he was noted to have left-sided facial and arm twitching. His wife reports that he had a similar episode last night.  Patient was not a TPA candidate secondary to delay in arrival. He was admitted to the ICU for further evaluation and treatment.  SUBJECTIVE His fiance is at the bedside.  Overall he feels his condition is unchanged. Diabetes Coordinator, RN and NT also at bedside. Pt states he cannot open his eyes due to the bright lights. States he has a long-term migraine - the first one he has ever had.  OBJECTIVE Most recent Vital Signs: Filed Vitals:   11/17/13 2007 11/17/13 2353 11/18/13 0352 11/18/13 0736  BP: 106/47 150/95 109/74 127/84  Pulse: 95 74 58 55  Temp: 98 F (36.7 C) 98 F (36.7 C) 98.5 F (36.9 C) 97.5 F (36.4 C)  TempSrc: Oral Oral Axillary Axillary  Resp: 20  18 14   Height:      Weight:      SpO2:  99% 93% 99%   CBG (last 3)   Recent Labs  11/17/13 2338 11/18/13 0350 11/18/13 0741  GLUCAP 298* 171* 174*    IV Fluid Intake:   . sodium chloride 50 mL/hr at 11/18/13 0853    MEDICATIONS  . aspirin  325 mg Oral Daily  . atorvastatin  40 mg Oral Daily  . irbesartan  300 mg Oral Daily   And  . hydrochlorothiazide  12.5 mg Oral Daily  . influenza vac split quadrivalent PF  0.5 mL Intramuscular  Tomorrow-1000  . insulin aspart  0-20 Units Subcutaneous TID WC  . insulin aspart protamine- aspart  45 Units Subcutaneous BID WC  . levETIRAcetam  500 mg Oral Q12H  . pregabalin  75 mg Oral BID   PRN:  acetaminophen, dextrose, morphine injection, oxyCODONE  Diet:  Carb Control thin liquids Activity:  Ambulated with assistance DVT Prophylaxis:  SCDs   CLINICALLY SIGNIFICANT STUDIES Basic Metabolic Panel:   Recent Labs Lab 11/17/13 0835 11/17/13 1213  NA 140 141  K 3.8 3.6  CL 103 104  CO2 26 27  GLUCOSE 151* 138*  BUN 19 18  CREATININE 0.93 0.91  CALCIUM 8.8 8.8   Liver Function Tests:   Recent Labs Lab 11/16/13 2100  AST 30  ALT 31  ALKPHOS 172*  BILITOT 1.6*  PROT 8.6*  ALBUMIN 4.6   CBC:   Recent Labs Lab 11/16/13 2100  WBC 6.1  NEUTROABS 4.1  HGB 16.0  HCT 43.4  MCV 84.4  PLT 222   Coagulation:   Recent Labs Lab 11/16/13 2100  LABPROT 14.9  INR 1.20   Cardiac Enzymes: No results found for this basename: CKTOTAL, CKMB, CKMBINDEX, TROPONINI,  in the last 168 hours Urinalysis: No results found for this basename: COLORURINE, APPERANCEUR, [place], [place],  [place], HGBUR, BILIRUBINUR, KETONESUR, PROTEINUR, UROBILINOGEN, NITRITE, LEUKOCYTESUR,  in the last 168 hours Lipid Panel    Component Value Date/Time   CHOL 138 11/17/2013 0405   TRIG 190* 11/17/2013 0405   HDL 26* 11/17/2013 0405   CHOLHDL 5.3 11/17/2013 0405   VLDL 38 11/17/2013 0405   LDLCALC 74 11/17/2013 0405   HgbA1C  Lab Results  Component Value Date   HGBA1C 12.2* 11/17/2013    Urine Drug Screen:   No results found for this basename: labopia,  cocainscrnur,  labbenz,  amphetmu,  thcu,  labbarb    Alcohol Level: No results found for this basename: ETH,  in the last 168 hours   CT of the brain  11/16/2013    Moderately large area of cytotoxic edema affecting the right temporal lobe consistent with an acute right MCA territory infarct without hemorrhage. Given the  hypoattenuation and relatively well delineated area of infarction, this insult is likely 59-72 hr old.    MRI of the brain  11/17/2013  Acute infarct right temporal parietal lobe with sparing of the basal ganglia.    MRA of the brain  11/17/2013     Occlusion of the anterior branch of the right middle cerebral artery which may be due to an embolus.     2D Echocardiogram    Carotid Doppler    CXR    EKG  sinus tachycardia.   Therapy Recommendations supervision, no PT needs  GENERAL EXAM:  Patient is in no distress; well developed, nourished and groomed  CARDIOVASCULAR:  Regular rate and rhythm, no murmurs, no carotid bruits   NEUROLOGIC:  MENTAL STATUS: awake, alert, oriented to person, place and time, normal attention and concentration, language fluent, comprehension intact, naming intact, fund of knowledge appropriate  CRANIAL NERVE: pupils equal and reactive to light, DECREASED LEFT VISUAL FIELD (INFERIOR), extraocular muscles intact, no nystagmus, facial sensation and strength DECR ON LEFT SIDE. hearing intact, palate elevates symmetrically, uvula midline, shoulder shrug symmetric, tongue midline. MILD DYSARTRHIA.  MOTOR: normal bulk and tone, full strength in the BUE, RLE; LLE 4/5.  SENSORY: DECR ON LEFT SIDE  COORDINATION: finger-nose-finger, fine finger movements normal ON RIGHT; SLOW ON LEFT.  REFLEXES: deep tendon reflexes present and symmetric  GAIT/STATION: IN BED   ASSESSMENT [name] is a 52 y.o. male presenting with headache and confusion.  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 Infarct felt to be embolic secondary to unknown etiology.  On no antithrombotics prior to admission. Now on aspirin 325 mg orally every day for secondary stroke prevention. Patient with resultant left field cut, left hemisensory deficits and left hemiparesis, focal seizuer. Work up underway.   Focal seizure, new onset, on keppra 500 q 12, EEG pending, seizure  secondary to stroke given location and onset  Non-sustained bradycardia 43-50s while asleep, asx Hypertension  Diabetes, HgbA1c 12.2, goal < 7.0 Hyperlipidemia, LDL 74, on lipitor 40 mg daily PTA, now on lipitor 40, goal LDL < 100 (< 70 for diabetics) Obesity, Body mass index is 38.69 kg/(m^2).  Hx B renal cell cancer  Hospital day # 2  TREATMENT/PLAN  Continue aspirin 325 mg orally every day for secondary stroke prevention.  Continue keppra, at long-term risk for current seizure  F/u EEG, carotid doppler, 2D echo TEE to look for embolic source. Arranged with [facility] for tomorrow.  If positive for PFO (patent foramen ovale), check bilateral lower extremity venous dopplers to rule out DVT as possible  source of stroke. (I made pt NPO after midnight.) Given age, check ANA, ESR and antiphospholipid panel. Due to multiple uncontrolled risk factors and lack of family history to support dx, will not check complete hypercoagulable panel Avoid driving at discharge until vision improves  Burnetta Sabin, MSN, RN, ANVP-BC, ANP-BC, [facility] Pager: [phone] 11/18/2013 10:24 AM  I have personally obtained a history, examined the patient, evaluated imaging results, and formulated the assessment and plan of care. I agree with the above. Antony Contras, MD

## 2013-11-18 NOTE — Progress Notes (Signed)
  Echocardiogram 2D Echocardiogram has been performed.  Basilia Jumbo 11/18/2013, 9:38 AM

## 2013-11-18 NOTE — Clinical Documentation Improvement (Signed)
THIS DOCUMENT IS NOT A PERMANENT PART OF THE MEDICAL RECORD  Please update your documentation with the medical record to reflect your response to this query. If you need help knowing how to do this please call [phone].  11/18/13   Dear Ivin Booty,  In a better effort to capture your patient's severity of illness, reflect appropriate length of stay and utilization of resources, a review of the patient medical record has revealed the following indicators.   Based on your clinical judgment, please clarify and document in a progress note and/or discharge summary the clinical condition associated with the following supporting information: In responding to this query please exercise your independent judgment.  The fact that a query is asked, does not imply that any particular answer is desired or expected.   Hello Ivin Booty!  Abnormal findings (laboratory, x-ray, MRI/CT scans, and other diagnostic results) are not coded and reported unless the physician indicates their clinical significance. The medical record reflects the following clinical findings. If possible, please help by clarifying the diagnostic and/or clinical significance of these abnormal findings. Thank you!  11/16/13 MRI Brain: Findings - There is brain edema in the infarct with mild local mass effect. No midline shift.  11/16/13 CT Head: Impression - Moderately large area of cytotoxic edema affecting the right temporal lobe consistent with an acute right MCA territory infarct without hemorrhage.   Possible Clinical Conditions?  - Brain edema  - Cytotoxic edema  - Other condition (please document in the progress notes and/or discharge summary)    Reviewed: additional documentation in the medical record 11/19/2013  Thank You,  [place] Documentation Specialist: [place]

## 2013-11-18 NOTE — Progress Notes (Signed)
Called for report RN in progression with MD on floor.

## 2013-11-18 NOTE — Progress Notes (Signed)
PT Cancellation Note  Patient Details Name: Brandon [name] MRN: [id number] DOB: 1961/04/03   Cancelled Treatment:    Reason Eval/Treat Not Completed: Other (comment) (Refused this am due to HA and just had Morphine.)   [name],Brandon Birky 11/18/2013, 10:15 AM [facility] [phone] (pager)

## 2013-11-19 ENCOUNTER — Encounter (HOSPITAL_COMMUNITY)
Admission: EM | Disposition: A | Discharge status: Home / Self Care | Payer: Self-pay | Source: Home / Self Care | Attending: Internal Medicine

## 2013-11-19 ENCOUNTER — Encounter (HOSPITAL_COMMUNITY): Payer: Self-pay | Admitting: Cardiology

## 2013-11-19 DIAGNOSIS — I6789 Other cerebrovascular disease: Secondary | ICD-10-CM

## 2013-11-19 DIAGNOSIS — G894 Chronic pain syndrome: Secondary | ICD-10-CM

## 2013-11-19 HISTORY — PX: TEE WITHOUT CARDIOVERSION: SHX5443

## 2013-11-19 LAB — CBC
HCT: 37.7 % — ABNORMAL LOW (ref 39.0–52.0)
Hemoglobin: 13.2 g/dL (ref 13.0–17.0)
MCH: 30.1 pg (ref 26.0–34.0)
MCHC: 35 g/dL (ref 30.0–36.0)
MCV: 86.1 fL (ref 78.0–100.0)
RDW: 11.6 % (ref 11.5–15.5)

## 2013-11-19 LAB — BASIC METABOLIC PANEL
BUN: 16 mg/dL (ref 6–23)
CO2: 24 mEq/L (ref 19–32)
Chloride: 100 mEq/L (ref 96–112)
Glucose, Bld: 258 mg/dL — ABNORMAL HIGH (ref 70–99)
Potassium: 4 mEq/L (ref 3.5–5.1)

## 2013-11-19 LAB — ANA: Anti Nuclear Antibody(ANA): NEGATIVE

## 2013-11-19 LAB — GLUCOSE, CAPILLARY
Glucose-Capillary: 304 mg/dL — ABNORMAL HIGH (ref 70–99)
Glucose-Capillary: 229 mg/dL — ABNORMAL HIGH (ref 70–99)
Glucose-Capillary: 252 mg/dL — ABNORMAL HIGH (ref 70–99)

## 2013-11-19 SURGERY — ECHOCARDIOGRAM, TRANSESOPHAGEAL
Anesthesia: Moderate Sedation

## 2013-11-19 MED ORDER — INSULIN ASPART PROT & ASPART (70-30 MIX) 100 UNIT/ML ~~LOC~~ SUSP
55.0000 [IU] | Freq: Two times a day (BID) | SUBCUTANEOUS | Status: DC
  Administered 2013-11-19 – 2013-11-20 (×2): 55 [IU] via SUBCUTANEOUS
  Filled 2013-11-19: qty 10

## 2013-11-19 MED ORDER — FENTANYL CITRATE 0.05 MG/ML IJ SOLN
INTRAMUSCULAR | Status: AC
  Filled 2013-11-19: qty 2

## 2013-11-19 MED ORDER — MIDAZOLAM HCL 5 MG/ML IJ SOLN
INTRAMUSCULAR | Status: AC
  Filled 2013-11-19: qty 2

## 2013-11-19 MED ORDER — BUTAMBEN-TETRACAINE-BENZOCAINE 2-2-14 % EX AERO
INHALATION_SPRAY | CUTANEOUS | Status: DC | PRN
  Administered 2013-11-19: 2 via TOPICAL

## 2013-11-19 MED ORDER — FENTANYL CITRATE 0.05 MG/ML IJ SOLN
INTRAMUSCULAR | Status: DC | PRN
  Administered 2013-11-19 (×2): 25 ug via INTRAVENOUS

## 2013-11-19 MED ORDER — SODIUM CHLORIDE 0.9 % IV SOLN: INTRAVENOUS | Status: DC

## 2013-11-19 MED ORDER — HYDROMORPHONE HCL PF 1 MG/ML IJ SOLN
0.5000 mg | INTRAMUSCULAR | Status: DC | PRN
  Administered 2013-11-19 – 2013-11-20 (×4): 0.5 mg via INTRAVENOUS
  Filled 2013-11-19 (×4): qty 1

## 2013-11-19 MED ORDER — PREGABALIN 50 MG PO CAPS
100.0000 mg | ORAL_CAPSULE | Freq: Two times a day (BID) | ORAL | Status: DC
  Administered 2013-11-19 – 2013-11-20 (×2): 100 mg via ORAL
  Filled 2013-11-19 (×2): qty 2

## 2013-11-19 MED ORDER — MIDAZOLAM HCL 10 MG/2ML IJ SOLN
INTRAMUSCULAR | Status: DC | PRN
  Administered 2013-11-19 (×2): 2 mg via INTRAVENOUS

## 2013-11-19 NOTE — Interval H&P Note (Signed)
History and Physical Interval Note:  11/19/2013 10:10 AM  Brandon [name]  has presented today for surgery, with the diagnosis of STROKE  The various methods of treatment have been discussed with the patient and family. After consideration of risks, benefits and other options for treatment, the patient has consented to  Procedure(s): TRANSESOPHAGEAL ECHOCARDIOGRAM (TEE) (N/A) as a surgical intervention .  The patient's history has been reviewed, patient examined, no change in status, stable for surgery.  I have reviewed the patient's chart and labs.  Questions were answered to the patient's satisfaction.     Kirk Ruths

## 2013-11-19 NOTE — Progress Notes (Signed)
Nurse gave TEE handout to patient and family and answered the questions. Asked patient to give consent, he refused to sign. I explained to him again and he said ' I know what they are going to do on me, my fiancee will give the consent for me' and he turned his head away from me. Consent signed by his fiancee is on the chart.

## 2013-11-19 NOTE — Progress Notes (Signed)
PT Cancellation Note  Patient Details Name: Brandon [name] MRN: [id number] DOB: [birth year]   Cancelled Treatment:    Reason Eval/Treat Not Completed: Patient at procedure or test/unavailable. Patient going for TEE at this time. Will follow up later today as able.    Jacqualyn Posey 11/19/2013, 9:02 AM

## 2013-11-19 NOTE — Progress Notes (Signed)
[facility] Progress Note    Brandon [name] [id number] DOB: Jul 29, 1961 DOA: 11/16/2013 PCP: Wyatt Haste, MD  Admit HPI / Brief Narrative: 52 y.o. male who developed headache and difficulty with vision 3 days prior to his admit. His wife noted increased confusion and attributed it to his ongoing headache. He also had N/V. On the day of his admission his wife noted a mild facial droop and made him come to ED.   While in ED patient noted to have L sided facial and arm twitching.  His wife reported similar episodes the night prior to his presentation.   HPI/Subjective: Confused after TEE    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)  -Neurology is following - ok to d/c  - workup has been initiated  -SLP has ok'd diet -await recent PT/OT -TEE ok  Severe headache -likely due to recent stroke -trial low dose Decadron  Newly diagnosed focal Sz - Felt to be due to above  - continue antiseizure medication as per Neuro  - EEG pending  HTN -Blood pressure currently well controlled  - avoid overcorrection in the acute setting  Severely uncontrolled DM -CBG not yet at goal  - follow CBG with sliding scale insulin - adjust scheduled insulin dosing  - pt was not checking CBG regularly at home  Hx of B renal cell CA -S/p cryoablation   HLD -Already on statin w/ LDL near goal   Left shoulder and hip pain -family states this has been worked up and is "nerve damage"  Code Status: FULL Family Communication:  Spoke with patient and wife at bedside Disposition Plan:  D/c in AM   Consultants: Neurology   Procedures: Carotid dopplers - 12/14 - Bilateral: 1-39% ICA stenosis. Vertebral artery flow is antegrade  Antibiotics: none  DVT prophylaxis: SCDs  Objective: Blood pressure 116/56, pulse 58, temperature 98.4 F (36.9 C), temperature source Oral, resp. rate 20, height 6\' 1"  (1.854 m), weight 133 kg (293 lb 3.4 oz),  SpO2 100.00%.  Intake/Output Summary (Last 24 hours) at 11/19/13 1210 Last data filed at 11/18/13 1600  Gross per 24 hour  Intake    750 ml  Output      0 ml  Net    750 ml   Exam: General: No acute respiratory distress, poor eye contact, angry Lungs: Clear to auscultation bilaterally without wheezes or crackles Cardiovascular: Regular rate and rhythm without murmur gallop or rub normal S1 and S2 Abdomen: Obese, nontender, nondistended, soft, bowel sounds positive, no rebound, no ascites, no appreciable mass Extremities: No significant cyanosis, clubbing, or edema bilateral lower extremities Neurological: Strength decreased on left side  Data Reviewed: Basic Metabolic Panel:  Recent Labs Lab 11/16/13 2343 11/17/13 0405 11/17/13 0835 11/17/13 1213 11/19/13 0351  NA 133* 137 140 141 134*  K 4.1 4.3 3.8 3.6 4.0  CL 98 99 103 104 100  CO2 25 28 26 27 24   GLUCOSE 561* 302* 151* 138* 258*  BUN 22 19 19 18 16   CREATININE 1.06 0.97 0.93 0.91 0.76  CALCIUM 8.3* 8.9 8.8 8.8 8.9   Liver Function Tests:  Recent Labs Lab 11/16/13 2100  AST 30  ALT 31  ALKPHOS 172*  BILITOT 1.6*  PROT 8.6*  ALBUMIN 4.6   CBC:  Recent Labs Lab 11/16/13 2100 11/19/13 0351  WBC 6.1 6.0  NEUTROABS 4.1  --   HGB 16.0 13.2  HCT 43.4 37.7*  MCV 84.4 86.1  PLT 222 206  CBG:  Recent Labs Lab 11/18/13 1113 11/18/13 1631 11/18/13 2126 11/19/13 0720 11/19/13 1143  GLUCAP 285* 381* 313* 304* 214*    Recent Results (from the past 240 hour(s))  MRSA PCR SCREENING     Status: None   Collection Time    11/17/13 12:50 AM      Result Value Range Status   MRSA by PCR NEGATIVE  NEGATIVE Final   Comment:            The GeneXpert MRSA Assay (FDA     approved for NASAL specimens     only), is one component of a     comprehensive MRSA colonization     surveillance program. It is not     intended to diagnose MRSA     infection nor to guide or     monitor treatment for     MRSA  infections.       Scheduled Meds:  Scheduled Meds: . aspirin  325 mg Oral Daily  . atorvastatin  40 mg Oral Daily  . irbesartan  300 mg Oral Daily   And  . hydrochlorothiazide  12.5 mg Oral Daily  . insulin aspart  0-20 Units Subcutaneous TID WC  . insulin aspart  8 Units Subcutaneous TID WC  . insulin aspart protamine- aspart  50 Units Subcutaneous BID WC  . levETIRAcetam  500 mg Oral Q12H  . pregabalin  75 mg Oral BID    Time spent on care of this patient: 35 mins   Eulogio Bear DO  [facility] [phone]   On-Call/Text Page:      [email or web address]      password [email or web address]  If 7PM-7AM, please contact night-coverage [email or web address] Password [email or web address] 11/19/2013, 12:10 PM   LOS: 3 days

## 2013-11-19 NOTE — Progress Notes (Addendum)
Stroke Team Progress Note  HISTORY Brandon [name] is a 52 y.o. male with a history of diabetes, hypertension who presents with 3 days of headache and confusion. He reports noticing that he was having trouble seeing 3 days ago and reported headache that started around the same time. Since that time, his wife has noticed confusion, but attributed it to his ongoing headache. He also has had some nausea and vomiting. Today (11/16/2013), his wife noticed that he had a mild left facial droop and therefore made him come in to the ED. He was not aware of his deficits, and after discussing with the wife, it is possible that she did not notice him on previous days. In the ED he was noted to have left-sided facial and arm twitching. His wife reports that he had a similar episode last night.  Patient was not a TPA candidate secondary to delay in arrival. He was admitted to the ICU for further evaluation and treatment.  SUBJECTIVE His wife is at the bedside. Patient just back from TEE. Acting a little goofy - likely related to sedation effect.  OBJECTIVE Most recent Vital Signs: Filed Vitals:   11/19/13 1045 11/19/13 1050 11/19/13 1055 11/19/13 1100  BP: 122/59 161/75  116/56  Pulse: 72 62 71 58  Temp:      TempSrc:      Resp: 23 20 14 20   Height:      Weight:      SpO2: 100% 100% 100% 100%   CBG (last 3)   Recent Labs  11/18/13 1631 11/18/13 2126 11/19/13 0720  GLUCAP 381* 313* 304*    IV Fluid Intake:   . sodium chloride 50 mL/hr at 11/18/13 0853  . sodium chloride      MEDICATIONS  . aspirin  325 mg Oral Daily  . atorvastatin  40 mg Oral Daily  . irbesartan  300 mg Oral Daily   And  . hydrochlorothiazide  12.5 mg Oral Daily  . insulin aspart  0-20 Units Subcutaneous TID WC  . insulin aspart  8 Units Subcutaneous TID WC  . insulin aspart protamine- aspart  50 Units Subcutaneous BID WC  . levETIRAcetam  500 mg Oral Q12H  . pregabalin  75 mg Oral BID   PRN:  acetaminophen,  dextrose, morphine injection, oxyCODONE  Diet:  NPO  Activity:  Ambulated with assistance DVT Prophylaxis:  SCDs   CLINICALLY SIGNIFICANT STUDIES Basic Metabolic Panel:   Recent Labs Lab 11/17/13 1213 11/19/13 0351  NA 141 134*  K 3.6 4.0  CL 104 100  CO2 27 24  GLUCOSE 138* 258*  BUN 18 16  CREATININE 0.91 0.76  CALCIUM 8.8 8.9   Liver Function Tests:   Recent Labs Lab 11/16/13 2100  AST 30  ALT 31  ALKPHOS 172*  BILITOT 1.6*  PROT 8.6*  ALBUMIN 4.6   CBC:   Recent Labs Lab 11/16/13 2100 11/19/13 0351  WBC 6.1 6.0  NEUTROABS 4.1  --   HGB 16.0 13.2  HCT 43.4 37.7*  MCV 84.4 86.1  PLT 222 206   Coagulation:   Recent Labs Lab 11/16/13 2100  LABPROT 14.9  INR 1.20   Cardiac Enzymes: No results found for this basename: CKTOTAL, CKMB, CKMBINDEX, TROPONINI,  in the last 168 hours Urinalysis: No results found for this basename: COLORURINE, APPERANCEUR, [place], [place], GLUCOSEU, HGBUR, BILIRUBINUR, KETONESUR, PROTEINUR, UROBILINOGEN, NITRITE, LEUKOCYTESUR,  in the last 168 hours Lipid Panel    Component Value Date/Time   CHOL 138 11/17/2013  0405   TRIG 190* 11/17/2013 0405   HDL 26* 11/17/2013 0405   CHOLHDL 5.3 11/17/2013 0405   VLDL 38 11/17/2013 0405   LDLCALC 74 11/17/2013 0405   HgbA1C  Lab Results  Component Value Date   HGBA1C 12.2* 11/17/2013    Urine Drug Screen:   No results found for this basename: labopia,  cocainscrnur,  labbenz,  amphetmu,  thcu,  labbarb    Alcohol Level: No results found for this basename: ETH,  in the last 168 hours  ANA, ESR normal  CT of the brain  11/16/2013    Moderately large area of cytotoxic edema affecting the right temporal lobe consistent with an acute right MCA territory infarct without hemorrhage. Given the hypoattenuation and relatively well delineated area of infarction, this insult is likely 84-72 hr old.    MRI of the brain  11/17/2013  Acute infarct right temporal parietal lobe with sparing  of the basal ganglia.    MRA of the brain  11/17/2013     Occlusion of the anterior branch of the right middle cerebral artery which may be due to an embolus.     2D Echocardiogram  EF 65-70% with no source of embolus.   Carotid Doppler  No evidence of hemodynamically significant internal carotid artery stenosis. Vertebral artery flow is antegrade.   TEE normal LV function, LVH, mild atherosclerosis descending aorta; negative saline microcavitation study.  EEG normal EEG recording with no signs of focal seizure disorder demonstrated.  EKG  sinus tachycardia.   Therapy Recommendations supervision, no PT , OP OT needs  GENERAL EXAM:  Patient is in no distress; well developed, nourished and groomed  CARDIOVASCULAR:  Regular rate and rhythm, no murmurs, no carotid bruits   NEUROLOGIC:  MENTAL STATUS: awake, alert, oriented to person, but not place and time, diminished attention and concentration, language fluent, speech tangential at times and easy distractibility.  CRANIAL NERVE: pupils equal and reactive to light, DECREASED LEFT VISUAL FIELD (INFERIOR), extraocular muscles intact, no nystagmus, facial sensation and strength DECR ON LEFT SIDE. hearing intact, palate elevates symmetrically, uvula midline, shoulder shrug symmetric, tongue midline. MILD DYSARTRHIA.  MOTOR: normal bulk and tone, full strength in the BUE, RLE; LLE 4/5.  SENSORY: DECR ON LEFT SIDE  COORDINATION: finger-nose-finger, fine finger movements normal ON RIGHT; SLOW ON LEFT.  REFLEXES: deep tendon reflexes present and symmetric  GAIT/STATION: IN BED   ASSESSMENT [name] is a 52 y.o. male presenting with headache and confusion.  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 Infarct felt to be embolic secondary to unknown etiology.  On no antithrombotics prior to admission. Now on aspirin 325 mg orally every day for secondary stroke prevention. Patient with  resultant left field cut, left hemisensory deficits and left hemiparesis, focal seizure. Work up completed.   Focal seizure, new onset, on keppra 500 q 12, EEG pending, seizure secondary to stroke given location and onset  Non-sustained bradycardia 43-50s while asleep, asx Hypertension  Diabetes, HgbA1c 12.2, goal < 7.0 Hyperlipidemia, LDL 74, on lipitor 40 mg daily PTA, now on lipitor 40, goal LDL < 100 (< 70 for diabetics) Obesity, Body mass index is 38.69 kg/(m^2).  Hx B renal cell cancer Patient complains of left side pain  Hospital day # 3  TREATMENT/PLAN  Continue aspirin 325 mg orally every day for secondary stroke prevention.  Continue keppra, at long-term risk for current seizure F/u antiphospholipid panel results Ok to resume diet  once sedation resolved Avoid driving at discharge until vision improves Do not recommend return to work at this time. PT to re-eval as wife concerned with taking him home as she works during the day. Previously, they recommended: OP or HH OT, no PT needs. Supervision recommended by therapy; wife works No further stroke workup indicated. Patient has a 10-15% risk of having another stroke over the next year, the highest risk is within 2 weeks of the most recent stroke/TIA (risk of having a stroke following a stroke or TIA is the same). Ongoing risk factor control by Primary Care Physician Stroke Service will sign off. Please call should any needs arise. Follow up with Dr. Leonie Man, [facility], in 2 months.   Burnetta Sabin, MSN, RN, ANVP-BC, ANP-BC, [facility] Pager: [phone] 11/19/2013 11:36 AM  I have personally obtained a history, examined the patient, evaluated imaging results, and formulated the assessment and plan of care. I agree with the above. Antony Contras, MD

## 2013-11-19 NOTE — Progress Notes (Signed)
Occupational Therapy Note Pt requires reinforcement for safety and visual scanning.  Recommend Neuropsych at discharge.   11/19/13 1736  OT Visit Information  Last OT Received On 11/19/13  Assistance Needed +1  History of Present Illness Pt found to have Acute infarct right temporal parietal lobe with sparing of the basal.  OT Time Calculation  OT Start Time 1609  OT Stop Time 1643  OT Time Calculation (min) 34 min  Precautions  Precautions Fall  ADL  Lower Body Dressing Min guard  Where Assessed - Lower Body Dressing Unsupported sit to stand  ADL Comments Pt instructed in use of reacher for home use and strong recommendation to use at home.  Pt previously indicating that he could not don/doff socks and has struggled for some time with this task; however, when pt asked to perform did it without difficulty.  Pt repetitively asking about driving.  Long discussion with pt and fiancee' re: recommendation for no driving and safety issues surrounding this activity.  Reiterated Lt to Rt scan strategies, and need for someone to walk on his Lt when in community environments until his speed of scanning is adequate for safe community mobility.  Pt resistant to this info, but verbalizes agreement when risk of injury pointed out.   Cognition  Arousal/Alertness Awake/alert  Behavior During Therapy Restless;Anxious;Impulsive  Overall Cognitive Status Impaired/Different from baseline  Area of Impairment Attention  Current Attention Level Selective  General Comments Per wife report pt is at baseline cognitively   OT - End of Session  Activity Tolerance Patient tolerated treatment well  Patient left in bed;with call bell/phone within reach;with family/visitor present  Nurse Communication Mobility status  OT Assessment/Plan  OT Plan Discharge plan remains appropriate  OT Frequency Min 3X/week  Follow Up Recommendations Outpatient OT;Supervision/Assistance - 24 hour  OT Equipment Tub/shower seat  OT  Goal Progression  Progress towards OT goals Progressing toward goals  Acute Rehab OT Goals  OT Goal Formulation With patient/family  Time For Goal Achievement 12/02/13  Potential to Achieve Goals Good  ADL Goals  Pt Will Perform Grooming with supervision;standing  Pt Will Perform Upper Body Bathing with supervision;sitting  Pt Will Perform Lower Body Bathing with supervision;sit to/from stand  Pt Will Perform Upper Body Dressing with supervision;sitting  Pt Will Perform Lower Body Dressing with supervision;sit to/from stand  Pt Will Transfer to Toilet with supervision;ambulating;regular height toilet;grab bars  Pt Will Perform Toileting - Clothing Manipulation and hygiene with supervision;sit to/from stand  Pt Will Perform Tub/Shower Transfer with min guard assist;ambulating;shower seat  Additional ADL Goal #1 Pt will locate items on Lt. with min cues consistently   OT General Charges  $OT Visit 1 Procedure  OT Treatments  $Self Care/Home Management  23-37 mins  [facility], OTR/L [phone]

## 2013-11-19 NOTE — CV Procedure (Signed)
See full TEE report in [facility]; normal LV function, LVH, mild atherosclerosis descending aorta; negative saline microcavitation study. Kirk Ruths

## 2013-11-19 NOTE — Progress Notes (Signed)
  Echocardiogram Echocardiogram Transesophageal has been performed.  Basilia Jumbo 11/19/2013, 10:47 AM

## 2013-11-19 NOTE — ED Provider Notes (Signed)
I saw and evaluated the patient, reviewed the resident's note and I agree with the findings and plan.  EKG Interpretation    Date/Time:  Saturday November 16 2013 20:44:32 EST Ventricular Rate:  105 PR Interval:  192 QRS Duration: 81 QT Interval:  340 QTC Calculation: 449 R Axis:   29 Text Interpretation:  Sinus tachycardia Anteroseptal infarct, old Baseline wander in lead(s) V6 ED PHYSICIAN INTERPRETATION AVAILABLE IN [facility] Confirmed by TEST, RECORD ([id number]) on 11/18/2013 7:08:23 AM            Patient presented with left-sided facial droop with left hand numbness and tingling. Timing is unclear. He is clearly outside the window for thrombolytics, however. Workup does show evidence of acute ischemic right-sided stroke. Patient admitted for further management.  Orpah Greek, MD 11/19/13 [phone]

## 2013-11-19 NOTE — H&P (View-Only) (Signed)
[facility] Progress Note [name]   Brandon [name] [id number] DOB: 05-17-1961 DOA: 11/16/2013 PCP: Brandon Haste, MD  Admit HPI / Brief Narrative: 52 y.o. male who developed headache and difficulty with vision 3 days prior to his admit. His wife noted increased confusion and attributed it to his ongoing headache. He also had N/V. On the day of his admission his wife noted a mild facial droop and made him come to ED.   While in ED patient noted to have L sided facial and arm twitching.  His wife reported similar episodes the night prior to his presentation.   HPI/Subjective: Alert but seems depressed. Wanting to know when we feel he can return to work. Still with HA, and reported pain in L arm and leg.    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)  -Neurology is following  - workup has been initiated  -SLP has ok'd diet -cont PT/OT -TEE planned for 12/16 at 10 am  Severe headache -likely due to recent stroke -trial low dose Decadron  Newly diagnosed focal Sz - Felt to be due to above  - continue antiseizure medication as per Neuro  - EEG pending  HTN -Blood pressure currently well controlled  - avoid overcorrection in the acute setting  Severely uncontrolled DM -CBG not yet at goal  - follow CBG with sliding scale insulin - adjust scheduled insulin dosing  - pt was not checking CBG regularly at home  Hx of B renal cell CA -S/p cryoablation   HLD -Already on statin w/ LDL near goal   Code Status: FULL Family Communication:  Spoke with patient and wife at bedside Disposition Plan:  transfer to neurology floor   Consultants: Neurology   Procedures: Carotid dopplers - 12/14 - Bilateral: 1-39% ICA stenosis. Vertebral artery flow is antegrade  Antibiotics: none  DVT prophylaxis: SCDs  Objective: Blood pressure 119/66, pulse 58, temperature 97.4 F (36.3 C), temperature source  Oral, resp. rate 18, height 6\' 1"  (1.854 m), weight 293 lb 3.4 oz (133 kg), SpO2 100.00%.  Intake/Output Summary (Last 24 hours) at 11/18/13 1306 Last data filed at 11/18/13 0600  Gross per 24 hour  Intake   2005 ml  Output    550 ml  Net   1455 ml   Exam: General: No acute respiratory distress Lungs: Clear to auscultation bilaterally without wheezes or crackles Cardiovascular: Regular rate and rhythm without murmur gallop or rub normal S1 and S2 Abdomen: Obese, nontender, nondistended, soft, bowel sounds positive, no rebound, no ascites, no appreciable mass Extremities: No significant cyanosis, clubbing, or edema bilateral lower extremities Neurological: Strength decreased on left side  Data Reviewed: Basic Metabolic Panel:  Recent Labs Lab 11/16/13 2100 11/16/13 2343 11/17/13 0405 11/17/13 0835 11/17/13 1213  NA 129* 133* 137 140 141  K 4.5 4.1 4.3 3.8 3.6  CL 88* 98 99 103 104  CO2 27 25 28 26 27   GLUCOSE 733* 561* 302* 151* 138*  BUN 24* 22 19 19 18   CREATININE 1.17 1.06 0.97 0.93 0.91  CALCIUM 9.3 8.3* 8.9 8.8 8.8   Liver Function Tests:  Recent Labs Lab 11/16/13 2100  AST 30  ALT 31  ALKPHOS 172*  BILITOT 1.6*  PROT 8.6*  ALBUMIN 4.6   CBC:  Recent Labs Lab 11/16/13 2100  WBC 6.1  NEUTROABS 4.1  HGB 16.0  HCT 43.4  MCV 84.4  PLT 222   CBG:  Recent Labs Lab  11/17/13 2005 11/17/13 2338 11/18/13 0350 11/18/13 0741 11/18/13 1113  GLUCAP 243* 298* 171* 174* 285*    Recent Results (from the past 240 hour(s))  MRSA PCR SCREENING     Status: None   Collection Time    11/17/13 12:50 AM      Result Value Range Status   MRSA by PCR NEGATIVE  NEGATIVE Final   Comment:            The GeneXpert MRSA Assay (FDA     approved for NASAL specimens     only), is one component of a     comprehensive MRSA colonization     surveillance program. It is not     intended to diagnose MRSA     infection nor to guide or     monitor treatment for     MRSA  infections.     Studies:  Recent x-ray studies have been reviewed in detail by the Attending Physician  Scheduled Meds:  Scheduled Meds: . aspirin  325 mg Oral Daily  . atorvastatin  40 mg Oral Daily  . irbesartan  300 mg Oral Daily   And  . hydrochlorothiazide  12.5 mg Oral Daily  . insulin aspart  0-20 Units Subcutaneous TID WC  . insulin aspart protamine- aspart  45 Units Subcutaneous BID WC  . levETIRAcetam  500 mg Oral Q12H  . pregabalin  75 mg Oral BID    Time spent on care of this patient: 35 mins   [name],Brandon L. ANP  [facility] Office  [phone] Pager - Text Page per Brandon [name] as per below:  On-Call/Text Page:      [name]      password [email or web address]  If 7PM-7AM, please contact night-coverage [email or web address] Password [email or web address] 11/18/2013, 1:06 PM   LOS: 2 days    I have personally examined this patient and reviewed the entire database. I have reviewed the above note, made any necessary editorial changes, and agree with its content.  Brandon Altes, MD [facility]

## 2013-11-19 NOTE — Progress Notes (Signed)
PT Cancellation Note  Patient Details Name: Brandon [name] MRN: [id number] DOB: 1961/01/15   Cancelled Treatment:    Reason Eval/Treat Not Completed: Pain limiting ability to participate. Attempted to see patient again this afternoon however patient refusing stating his pain was not under control and he needed to speak with the MD. Patient agreeable to work with therapy first thing in the morning to attempt stairs.    Jacqualyn Posey 11/19/2013, 1:25 PM

## 2013-11-19 NOTE — Progress Notes (Signed)
Speech Language Pathology Treatment: Dysphagia  Patient Details Name: Brandon [name] MRN: [id number] DOB: [birth year] Today's Date: 11/19/2013 Time: [id number] SLP Time Calculation (min): 10 min  Assessment / Plan / Recommendation Clinical Impression  Skilled treatment session focused on addressing dysphagia goals.  SLP facilitated session with skilled observation during consumption of regular textures and thi liquids via straw.  Patient demonstrated efficient mastication and timely swallows with consecutive straws sips with no observed s/s of aspiration.  As a result, no further skilled SLP services are warranted at this time.    Of note, during wrap up education and discharge discussion SLP mentioned that no throat clears were observed, and following patient began exhibiting throat clears.  However, when SLP brushed off response patient stopped.  Taking into consideration other things that occurred across therapy sessions the team may want to consider outpatient follow-up therapy with Neuro Psych.     HPI HPI: Brandon [name] is a 52 y.o. male with a history of diabetes, hypertension who presents with 3 days of headache and confusion. He reports noticing that he was having trouble seeing 3 days ago and reported headache that started around the same time. Since that time, his wife has noticed confusion, but attributed it to his ongoing headache. He also has had some nausea and vomiting. Today, his wife noticed that he had a mild left facial droop and therefore made him come in to the ED. He was not aware of his deficits, and after discussing with the wife, it is possible that she did not notice him on previous days. 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    Pertinent Vitals none  SLP Plan  All goals met;Discharge SLP treatment due to (comment)    Recommendations Diet recommendations: Regular;Thin liquid Liquids provided via: Cup;Straw Medication  Administration: Whole meds with liquid Supervision: Patient able to self feed;Intermittent supervision to cue for compensatory strategies Compensations: Slow rate;Small sips/bites Postural Changes and/or Swallow Maneuvers: Seated upright 90 degrees;Upright 30-60 min after meal              Oral Care Recommendations: Oral care BID Follow up Recommendations: Other (comment) (NeuroPsych-outpatient ) Plan: All goals met;Discharge SLP treatment due to (comment)    GO    Gunnar Fusi, M.A., CCC-SLP [phone]  [place] 11/19/2013, 4:22 PM

## 2013-11-19 NOTE — Progress Notes (Signed)
Occupational Therapy Treatment Patient Details Name: Matas Laurenzi MRN: [id number] DOB: 09-14-1961 Today's Date: 11/19/2013 Time: [id number] OT Time Calculation (min): 59 min  OT Assessment / Plan / Recommendation  History of present illness Pt found to have Acute infarct right temporal parietal lobe with sparing of the basal.   OT comments  Pt requiring min guard to mod A for functional mobility.  He is able to functionally scan a familiar environment with an organized array of items, but requires mod A in an unstructured environment.  Pt and fiancee' provided with info re: scanning and safety.  At end of session pt demonstrated increased rigidity and shaking of Lt. UE requiring mod A to move to EOB, but movement difficulties immediately disappeared once pt moved into seated position.   Celesta Gentile' reports pt has attention seeking behaviors and she is concerned that he has psychiatric/"mental" issues and would like him evaluated for such.  Recommendation made for neuropsyche testing.   Based on performance today, pt is at risk for falling, but unsure if pt wasn't exaggerating symptoms.   Follow Up Recommendations  Outpatient OT;Supervision/Assistance - 24 hour    Barriers to Discharge       Equipment Recommendations  Tub/shower seat    Recommendations for Other Services    Frequency Min 3X/week   Progress towards OT Goals Progress towards OT goals: Progressing toward goals  Plan Discharge plan remains appropriate    Precautions / Restrictions Precautions Precautions: Fall   Pertinent Vitals/Pain     ADL  Grooming: Teeth care;Wash/dry hands;Wash/dry face;Min guard Where Assessed - Grooming: Unsupported standing Transfers/Ambulation Related to ADLs: min A ADL Comments: Pt ambulated to BR with short shuffling gait and hips slightly flexed.  Pt ambualted into and out of bathroom and around room with this gait pattern and no LOB.  Pt's fiancee' reports pt walks like this when he is sick,  and pt reports he is walking like this because he can't see. When exiting room, pt utilized tactile input to locate door handle.  Discussed scanning strategies with pt and worked on scanning Lt to [place] when ambulating in hallway.  Therapist walked on his Lt side for increased safety.  As pt continued, he was noted to lean forward and to Lt. requiring min A at times.  Discussed need to have family walk to his Lt when out in community.  Upon returning to the room, pt insisted on attempting to retrieve object from floor, but was unable to perform task safely and therapist had to intervene for him to discontinue attempts and prevent fall.  Pt then began with tremors/shaking of Lt UE with increased rigidity of trunk and LEs and difficulty advancing LEs to move to EOB.  Therapist assisted pt (mod A) to move to EOB.  Pt then required physical assistance to sit.  As soon as he sat, he began talking jovially moving UEs and LEs without difficulty.   As therapist was leaving room, his fiancee' slipped me a note stating that pt has attention seeking behaviors, that it is often difficult to determine what is real, and that she feels he has mental disabilities and needs to be evaluated for such.  Note was shown to RN, who copied note to place in shadow chart, and phone call made to Burnetta Sabin, NP re: content of note.   At this time recommend neurophych evaluation.     OT Diagnosis:    OT Problem List:   OT Treatment Interventions:     OT  Goals(current goals can now be found in the care plan section) Acute Rehab OT Goals Patient Stated Goal: To get his vision back  OT Goal Formulation: With patient/family Time For Goal Achievement: 12/02/13 Potential to Achieve Goals: Good ADL Goals Pt Will Perform Grooming: with supervision;standing Pt Will Perform Upper Body Bathing: with supervision;sitting Pt Will Perform Lower Body Bathing: with supervision;sit to/from stand Pt Will Perform Upper Body Dressing: with  supervision;sitting Pt Will Perform Lower Body Dressing: with supervision;sit to/from stand Pt Will Transfer to Toilet: with supervision;ambulating;regular height toilet;grab bars Pt Will Perform Toileting - Clothing Manipulation and hygiene: with supervision;sit to/from stand Pt Will Perform Tub/Shower Transfer: with min guard assist;ambulating;shower seat Additional ADL Goal #1: Pt will locate items on Lt. with min cues consistently   Visit Information  Last OT Received On: 11/19/13 Assistance Needed: +1 History of Present Illness: Pt found to have Acute infarct right temporal parietal lobe with sparing of the basal.    Subjective Data      Prior Functioning       Cognition  Cognition Arousal/Alertness: Awake/alert Behavior During Therapy: Restless;Anxious;Impulsive Overall Cognitive Status: Impaired/Different from baseline Area of Impairment: Attention Current Attention Level: Selective General Comments: Per wife report pt is at baseline cognitively     Mobility  Bed Mobility Bed Mobility: Not assessed Transfers Transfers: Sit to Stand;Stand to Sit Sit to Stand: 4: Min guard;With upper extremity assist;4: Min assist Stand to Sit: 4: Min assist;With upper extremity assist;To bed Details for Transfer Assistance: Pt min guard assist initially - somewhat unsteady.  When returning to sit EOB, pt appeared to not be able to move, and required physical assist to lower onto bed.  Once seated, pt threw up his arms an started swinging his legs EOB without difficulty     Exercises      Balance Balance Balance Assessed: Yes Dynamic Standing Balance Dynamic Standing - Balance Support: No upper extremity supported;Right upper extremity supported Dynamic Standing - Level of Assistance: 5: Stand by assistance;3: Mod assist Dynamic Standing - Balance Activities: Other (comment) (grooming at sink; attempting to retrieve item from floor) Dynamic Standing - Comments: Pt with no LOB while  performing grooming activity.  Pt. insistant on attempting to retrieve item from floor.  With rt. UE support, pt reached forward, but was unable to properly position self and counter balance his weight correctly.  Therapist had to provide physical intervention for pt to stop attempting the activity as pt was insistant that he could reach to the floor.  Firm conversation with pt and fiancee' re: fall risk, and that he was not safe to attempt such activity.  Discussed use of reacher for home, but pt somewhat resistant to the recommendation.    End of Session OT - End of Session Activity Tolerance: Patient tolerated treatment well Patient left: in chair;with call bell/phone within reach;with family/visitor present Nurse Communication: Mobility status;Other (comment) (see ADL comments)  GO     Jestina Stephani, Ellard Artis M 11/19/2013, 5:30 PM

## 2013-11-20 ENCOUNTER — Encounter (HOSPITAL_COMMUNITY): Payer: Self-pay | Admitting: Cardiology

## 2013-11-20 DIAGNOSIS — I10 Essential (primary) hypertension: Secondary | ICD-10-CM

## 2013-11-20 DIAGNOSIS — E119 Type 2 diabetes mellitus without complications: Secondary | ICD-10-CM

## 2013-11-20 LAB — ANTIPHOSPHOLIPID SYNDROME EVAL, BLD
Anticardiolipin IgG: 17 GPL U/mL (ref <23)
Anticardiolipin IgM: 7 MPL U/mL — ABNORMAL LOW (ref <11)
DRVVT: 34.4 secs (ref <42.9)
Lupus Anticoagulant: NOT DETECTED
PTT Lupus Anticoagulant: 36.2 secs (ref 28.0–43.0)
Phosphatydalserine, IgA: 6 U/mL (ref <20)
Phosphatydalserine, IgG: 6 U/mL (ref <16)

## 2013-11-20 LAB — GLUCOSE, CAPILLARY: Glucose-Capillary: 150 mg/dL — ABNORMAL HIGH (ref 70–99)

## 2013-11-20 MED ORDER — OXYCODONE HCL 5 MG PO TABS: 5.0000 mg | ORAL_TABLET | ORAL | Status: DC | PRN

## 2013-11-20 MED ORDER — LEVETIRACETAM 500 MG PO TABS: 500.0000 mg | ORAL_TABLET | Freq: Two times a day (BID) | ORAL | Status: DC

## 2013-11-20 MED ORDER — INSULIN ASPART PROT & ASPART (70-30 MIX) 100 UNIT/ML ~~LOC~~ SUSP: 55.0000 [IU] | Freq: Two times a day (BID) | SUBCUTANEOUS | Status: DC

## 2013-11-20 MED ORDER — PREGABALIN 100 MG PO CAPS: 100.0000 mg | ORAL_CAPSULE | Freq: Two times a day (BID) | ORAL | Status: DC

## 2013-11-20 MED ORDER — ASPIRIN 325 MG PO TABS: 325.0000 mg | ORAL_TABLET | Freq: Every day | ORAL | Status: DC

## 2013-11-20 NOTE — Progress Notes (Signed)
Talked to patient about DCP; patient is agreeable to go to Outpatient OT for rehab/ clinical information faxed; Apt made with Neuro [facility] as MD requested - for 12/19/2013 at 1:30pm; Aneta Mins [id number]

## 2013-11-20 NOTE — Progress Notes (Signed)
OT Cancellation Note  Patient Details Name: Brandon [name] MRN: [id number] DOB: May 27, 1961   Cancelled Treatment:    Reason Eval/Treat Not Completed: Patient declined, no reason specified  Walker Kehr [id number] 11/20/2013, 1:51 PM

## 2013-11-20 NOTE — Discharge Summary (Signed)
Physician Discharge Summary  Brandon [name] [id number] DOB: Dec 04, 1961 DOA: 11/16/2013  PCP: Wyatt Haste, MD  Admit date: 11/16/2013 Discharge date: 11/20/2013  Time spent: 35 minutes  Recommendations for Outpatient Follow-up:  1. Outpatient OT 2. Neuro-psych consult 3. Outpatient diabetes meds titration- strict trial of DIABETIC DIET   Discharge Diagnoses:  Principal Problem:   Acute ischemic stroke Active Problems:   Focal seizure   Stroke   Type 2 diabetes mellitus with hyperosmolar nonketotic hyperglycemia   Discharge Condition: improved  Diet recommendation: cardiac/diabetic  Filed Weights   11/17/13 0045  Weight: 133 kg (293 lb 3.4 oz)    History of present illness:  Brandon [name] is a 52 y.o. male who developed headache and difficulty with vision 3 days ago. Wife noted increased confusion since that time and attributed it to his ongoing headache. Has also had N/V. Today wife noted mild facial droop and therefore made him come to ED. Patient not aware of deficits.  While in ED patient noted to have L sided facial and arm twitching as well, wife reports similar episode last night.      Hospital Course:  Acute R temporal/parietal lobe CVA (inferior division R MCA)  -Neurology is following - ok to d/c  - workup has been initiated  -SLP has ok'd diet  -PT signed off- 24 hour supervision; OT- outpatient OT -TEE ok    headache  -resolved  Newly diagnosed focal Sz  - Felt to be due to above  - continue antiseizure medication as per Neuro   HTN  -Blood pressure currently well controlled  - avoid overcorrection in the acute setting   Severely uncontrolled DM  -CBG not yet at goal  - check CBG at home and bring to PCP for adjustments  Hx of B renal cell CA  -S/p cryoablation   HLD  -Already on statin w/ LDL near goal   Left shoulder and hip pain  -family states this has been worked up and is "nerve damage" -titrate up lyrica  ?psych  issues- emotionally labile- girlfriend ? Bipolar -referral to outpatient neuro psych for evaluation   Procedures:  TEE  EEG  Consultations:  Neuro  cards  Discharge Exam: Filed Vitals:   11/20/13 1156  BP: 154/93  Pulse: 62  Temp: 98 F (36.7 C)  Resp: 20    General: A+Ox3, NAD Cardiovascular: rrr Respiratory: clear anterior  Discharge Instructions      Discharge Orders   Future Orders Complete By Expires   Diet - low sodium heart healthy  As directed    Diet Carb Modified  As directed    Discharge instructions  As directed    Comments:     No driving 24 hour supervision by family/friends Check BS at home and bring to PCP for adjustments   Increase activity slowly  As directed        Medication List    STOP taking these medications       cyclobenzaprine 10 MG tablet  Commonly known as:  FLEXERIL     insulin NPH-regular (70-30) 100 UNIT/ML injection  Commonly known as:  NOVOLIN 70/30  Replaced by:  insulin aspart protamine- aspart (70-30) 100 UNIT/ML injection     naproxen sodium 220 MG tablet  Commonly known as:  ANAPROX     sildenafil 50 MG tablet  Commonly known as:  VIAGRA      TAKE these medications       aspirin 325 MG tablet  Take 1 tablet (  325 mg total) by mouth daily.     atorvastatin 40 MG tablet  Commonly known as:  LIPITOR  Take 40 mg by mouth daily.     insulin aspart protamine- aspart (70-30) 100 UNIT/ML injection  Commonly known as:  NOVOLOG MIX 70/30  Inject 0.55 mLs (55 Units total) into the skin 2 (two) times daily with a meal.     levETIRAcetam 500 MG tablet  Commonly known as:  KEPPRA  Take 1 tablet (500 mg total) by mouth every 12 (twelve) hours.     metFORMIN 500 MG tablet  Commonly known as:  GLUCOPHAGE  Take 500 mg by mouth 2 (two) times daily with a meal.     oxyCODONE 5 MG immediate release tablet  Commonly known as:  Oxy IR/ROXICODONE  Take 1-2 tablets (5-10 mg total) by mouth every 4 (four) hours as  needed for moderate pain.     pregabalin 100 MG capsule  Commonly known as:  LYRICA  Take 1 capsule (100 mg total) by mouth 2 (two) times daily.     telmisartan-hydrochlorothiazide 80-12.5 MG per tablet  Commonly known as:  MICARDIS HCT  Take 1 tablet by mouth daily.       No Known Allergies Follow-up Information   Follow up with Forbes Cellar, MD. Schedule an appointment as soon as possible for a visit in 2 months. (stroke clinic)    Specialties:  Neurology, Radiology   Contact information:   [address] [address] [phone]       Follow up with Wyatt Haste, MD In 1 week.   Specialty:  Family Medicine   Contact information:   [address] [place] [address] [phone]       Follow up On 12/19/2013. (at 1:30pm; please try to keep your apt or call to rechedule)    Contact information:   Neuro [facility] [address] [place], [place] [address]  [phone]       The results of significant diagnostics from this hospitalization (including imaging, microbiology, ancillary and laboratory) are listed below for reference.    Significant Diagnostic Studies: Ct Head Wo Contrast  11/16/2013   CLINICAL DATA:  Severe headache. Initially a code stroke but canceled. Stroke risk factors include diabetes and hypertension. History of renal cell cancer.  EXAM: CT HEAD WITHOUT CONTRAST  TECHNIQUE: Contiguous axial images were obtained from the base of the skull through the vertex without contrast.  COMPARISON:  None  FINDINGS: There is a moderately large area of cytotoxic edema affecting much of the right temporal lobe anteriorly and laterally within the distribution of the right middle cerebral artery. This is consistent with an infarction probably 48-72 hr of age. There is no hemorrhagic transformation. There is no midline shift or significant displacement of the uncus.  No definite thrombus is seen in the right MCA or  its branches. There is vascular calcification of a moderate degree affecting the carotid siphons.  No other areas of cortical infarction are seen. The ventricles are normal in size and midline. There is no extra-axial fluid or mass lesion. Calvarium is intact. Clear sinuses and mastoids.  IMPRESSION: Moderately large area of cytotoxic edema affecting the right temporal lobe consistent with an acute right MCA territory infarct without hemorrhage. Given the hypoattenuation and relatively well delineated area of infarction, this insult is likely 26-72 hr old.  Even though the code stroke was canceled, I personally communicated these findings to the ordering provider, who  voiced understanding.   Electronically Signed   By: Rolla Flatten M.D.   On: 11/16/2013 21:23   Mr Brain Wo Contrast  11/17/2013   CLINICAL DATA:  Stroke.  EXAM: MRI HEAD WITHOUT CONTRAST  MRA HEAD WITHOUT CONTRAST  TECHNIQUE: Multiplanar, multiecho pulse sequences of the brain and surrounding structures were obtained without intravenous contrast. Angiographic images of the head were obtained using MRA technique without contrast.  COMPARISON:  CT 11/16/2013  FINDINGS: MRI HEAD FINDINGS  Acute infarct right MCA territory. This involves the inferior and lateral temporal lobe extending into the right parietal lobe. This involves approximately 1/3 of the right MCA territory. There is sparing of the basal ganglia. No acute infarct on the left or in the posterior fossa. There is brain edema in the infarct with mild local mass effect. No midline shift.  Negative for hydrocephalus. Small subcortical white matter hyperintensities bilaterally compatible with minimal chronic microvascular ischemic change. Negative for intracranial hemorrhage or mass lesion.  MRA HEAD FINDINGS  Both vertebral arteries are patent to the basilar. Right posterior inferior cerebellar artery is patent. Left posterior inferior cerebellar artery not visualized. The basilar is patent.  Superior cerebellar and posterior cerebral arteries are patent bilaterally. Fetal origin of the right posterior cerebral artery with hypoplastic right P1 segment. Left posterior communicating artery is patent.  The internal carotid artery is patent bilaterally. Left middle cerebral artery widely patent. Both anterior cerebral arteries are patent.  Right M1 segment is patent. There is an early occluded branch of the right middle cerebral artery corresponding to the area of acute infarct. This shows abrupt occlusion proximally. The parietal branch of the right middle cerebral artery is patent.  IMPRESSION: Acute infarct right temporal parietal lobe with sparing of the basal ganglia.  Occlusion of the anterior branch of the right middle cerebral artery which may be due to an embolus.   Electronically Signed   By: Franchot Gallo M.D.   On: 11/17/2013 12:39   Mr Jodene Nam Head/brain Wo Cm  11/17/2013   CLINICAL DATA:  Stroke.  EXAM: MRI HEAD WITHOUT CONTRAST  MRA HEAD WITHOUT CONTRAST  TECHNIQUE: Multiplanar, multiecho pulse sequences of the brain and surrounding structures were obtained without intravenous contrast. Angiographic images of the head were obtained using MRA technique without contrast.  COMPARISON:  CT 11/16/2013  FINDINGS: MRI HEAD FINDINGS  Acute infarct right MCA territory. This involves the inferior and lateral temporal lobe extending into the right parietal lobe. This involves approximately 1/3 of the right MCA territory. There is sparing of the basal ganglia. No acute infarct on the left or in the posterior fossa. There is brain edema in the infarct with mild local mass effect. No midline shift.  Negative for hydrocephalus. Small subcortical white matter hyperintensities bilaterally compatible with minimal chronic microvascular ischemic change. Negative for intracranial hemorrhage or mass lesion.  MRA HEAD FINDINGS  Both vertebral arteries are patent to the basilar. Right posterior inferior cerebellar  artery is patent. Left posterior inferior cerebellar artery not visualized. The basilar is patent. Superior cerebellar and posterior cerebral arteries are patent bilaterally. Fetal origin of the right posterior cerebral artery with hypoplastic right P1 segment. Left posterior communicating artery is patent.  The internal carotid artery is patent bilaterally. Left middle cerebral artery widely patent. Both anterior cerebral arteries are patent.  Right M1 segment is patent. There is an early occluded branch of the right middle cerebral artery corresponding to the area of acute infarct. This shows abrupt occlusion proximally. The  parietal branch of the right middle cerebral artery is patent.  IMPRESSION: Acute infarct right temporal parietal lobe with sparing of the basal ganglia.  Occlusion of the anterior branch of the right middle cerebral artery which may be due to an embolus.   Electronically Signed   By: Franchot Gallo M.D.   On: 11/17/2013 12:39    Microbiology: Recent Results (from the past 240 hour(s))  MRSA PCR SCREENING     Status: None   Collection Time    11/17/13 12:50 AM      Result Value Range Status   MRSA by PCR NEGATIVE  NEGATIVE Final   Comment:            The GeneXpert MRSA Assay (FDA     approved for NASAL specimens     only), is one component of a     comprehensive MRSA colonization     surveillance program. It is not     intended to diagnose MRSA     infection nor to guide or     monitor treatment for     MRSA infections.     Labs: Basic Metabolic Panel:  Recent Labs Lab 11/16/13 2343 11/17/13 0405 11/17/13 0835 11/17/13 1213 11/19/13 0351  NA 133* 137 140 141 134*  K 4.1 4.3 3.8 3.6 4.0  CL 98 99 103 104 100  CO2 25 28 26 27 24   GLUCOSE 561* 302* 151* 138* 258*  BUN 22 19 19 18 16   CREATININE 1.06 0.97 0.93 0.91 0.76  CALCIUM 8.3* 8.9 8.8 8.8 8.9   Liver Function Tests:  Recent Labs Lab 11/16/13 2100  AST 30  ALT 31  ALKPHOS 172*  BILITOT 1.6*   PROT 8.6*  ALBUMIN 4.6   No results found for this basename: LIPASE, AMYLASE,  in the last 168 hours No results found for this basename: AMMONIA,  in the last 168 hours CBC:  Recent Labs Lab 11/16/13 2100 11/19/13 0351  WBC 6.1 6.0  NEUTROABS 4.1  --   HGB 16.0 13.2  HCT 43.4 37.7*  MCV 84.4 86.1  PLT 222 206   Cardiac Enzymes: No results found for this basename: CKTOTAL, CKMB, CKMBINDEX, TROPONINI,  in the last 168 hours BNP: BNP (last 3 results) No results found for this basename: PROBNP,  in the last 8760 hours CBG:  Recent Labs Lab 11/19/13 0720 11/19/13 1143 11/19/13 1608 11/19/13 2123 11/20/13 0643  GLUCAP 304* 214* 229* 252* 150*       Signed:  Brynlie Daza  [facility] 11/20/2013, 1:08 PM

## 2013-11-20 NOTE — Progress Notes (Signed)
Discharge instructions given. Pt verbalized understanding

## 2013-11-20 NOTE — Progress Notes (Signed)
Physical Therapy Treatment Patient Details Name: Brandon [name] MRN: [id number] DOB: 1961-09-29 Today's Date: 11/20/2013 Time: [id number] PT Time Calculation (min): 18 min  PT Assessment / Plan / Recommendation  History of Present Illness Pt found to have Acute infarct right temporal parietal lobe with sparing of the basal.   PT Comments   Patient agreeable though irritated. Able to comoplete stair training.   Follow Up Recommendations  Supervision/Assistance - 24 hour;No PT follow up     Does the patient have the potential to tolerate intense rehabilitation     Barriers to Discharge        Equipment Recommendations  None recommended by PT    Recommendations for Other Services    Frequency Min 3X/week   Progress towards PT Goals Progress towards PT goals: Progressing toward goals  Plan      Precautions / Restrictions Precautions Precautions: Fall   Pertinent Vitals/Pain Shoulder pain. premedicated     Mobility  Bed Mobility Supine to Sit: 6: Modified independent (Device/Increase time) Transfers Sit to Stand: 5: Supervision Stand to Sit: 5: Supervision Ambulation/Gait Ambulation/Gait Assistance: 5: Supervision Ambulation Distance (Feet): 400 Feet Assistive device: None Gait Pattern: Step-through pattern Stairs: Yes Stairs Assistance: 5: Supervision Stair Management Technique: Two rails Number of Stairs: 10    Exercises     PT Diagnosis:    PT Problem List:   PT Treatment Interventions:     PT Goals (current goals can now be found in the care plan section)    Visit Information  Last PT Received On: 11/20/13 Assistance Needed: +1 History of Present Illness: Pt found to have Acute infarct right temporal parietal lobe with sparing of the basal.    Subjective Data      Cognition  Cognition Arousal/Alertness: Awake/alert Behavior During Therapy: Restless;Anxious;Impulsive Overall Cognitive Status: Impaired/Different from baseline Area of Impairment:  Attention General Comments: Per wife report pt is at baseline cognitively     Balance     End of Session PT - End of Session Equipment Utilized During Treatment: Gait belt Activity Tolerance: Patient tolerated treatment well Nurse Communication: Mobility status   GP     Jacqualyn Posey 11/20/2013, 10:40 AM  11/20/2013 Jacqualyn Posey PTA [phone] pager [phone] office

## 2013-12-02 ENCOUNTER — Encounter: Payer: Self-pay | Admitting: Medical

## 2013-12-02 ENCOUNTER — Ambulatory Visit (INDEPENDENT_AMBULATORY_CARE_PROVIDER_SITE_OTHER): Payer: BC Managed Care – PPO | Admitting: Medical

## 2013-12-02 VITALS — BP 128/90 | HR 82 | Temp 98.1°F | Resp 16 | Wt 285.0 lb

## 2013-12-02 DIAGNOSIS — I6350 Cerebral infarction due to unspecified occlusion or stenosis of unspecified cerebral artery: Secondary | ICD-10-CM

## 2013-12-02 DIAGNOSIS — I10 Essential (primary) hypertension: Secondary | ICD-10-CM

## 2013-12-02 DIAGNOSIS — M79602 Pain in left arm: Secondary | ICD-10-CM

## 2013-12-02 DIAGNOSIS — I639 Cerebral infarction, unspecified: Secondary | ICD-10-CM

## 2013-12-02 DIAGNOSIS — E1165 Type 2 diabetes mellitus with hyperglycemia: Secondary | ICD-10-CM

## 2013-12-02 DIAGNOSIS — E785 Hyperlipidemia, unspecified: Secondary | ICD-10-CM

## 2013-12-02 DIAGNOSIS — M79609 Pain in unspecified limb: Secondary | ICD-10-CM

## 2013-12-02 DIAGNOSIS — M79605 Pain in left leg: Secondary | ICD-10-CM

## 2013-12-02 DIAGNOSIS — R569 Unspecified convulsions: Secondary | ICD-10-CM

## 2013-12-02 MED ORDER — HYDROCODONE-ACETAMINOPHEN 7.5-325 MG PO TABS: 1.0000 | ORAL_TABLET | Freq: Two times a day (BID) | ORAL | Status: DC

## 2013-12-02 NOTE — Progress Notes (Signed)
Subjective:  Brandon [name] is a 52 y.o. male who presents for hospital f/u, accompanied by wife.   Was recently admitted for stroke, seizure and uncontrolled diabetes.    He is complaining about quite moderate pain in left shoulder and left hip.  Denies fall or injury. Doesn't recall pains prior to the stroke, these pains have all started since his stroke.  Both he and wife report that his speech is slower than it was prior to the stroke, has trouble coming up with words and thoughts at times, easily frustrated that he is less mobile and less clear with his thought processes at this time.  Since the stroke he has some weakness and some numb feeling of his left arm and leg, has lost vision to some extent in both eyes.  He has not started outpatient therapy yet. He has not worked in close to a years due to complications or problems related to prior kidney cancer.  But the stroke has set him back even further.   Using Novolog 70/30, 55 units BID.  Metformin 500mg  BID.  Having loose stool though.  Checking glucose 158, mid 100s, 174 the highest, pre meal.  Checking glucose BID.    No other c/o.  The following portions of the patient's history were reviewed and updated as appropriate: allergies, current medications, past family history, past medical history, past social history, past surgical history and problem list.  ROS Otherwise as in subjective above  Past Medical History  Diagnosis Date  . Hypertension   . Diabetes mellitus     DKA sept admission  . Diverticulitis     s/p micorperforation Sept 2012-managed conservatively by Gen surgery  . Kidney tumor 09/2011    Renal cell CA  . Wears glasses   . ED (erectile dysfunction)   . 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 Acute ischemic stroke 11/2013  . Focal seizure 11/2013    due to ischemic stroke    Objective: Physical Exam  BP 128/90   Pulse 82  Temp(Src) 98.1 F (36.7 C) (Oral)  Resp 16  Wt 285 lb (129.275 kg)   General appearance: alert, no distress, WD/WN Neck: supple, no lymphadenopathy, no thyromegaly, no masses, no bruits Heart: RRR, normal S1, S2, no murmurs Lungs: CTA bilaterally, no wheezes, rhonchi, or rales Pulses: 2+ radial pulses, 2+ pedal pulses, normal cap refill Ext: no edema Neuro: left lower face with slight droop, pupils 34mm bilat, PERRLA, has trouble following EOM, dull sensation throughout left arm and leg, weaker throughout left side 4/5, speech is slow and has trouble getting words at times, but other times answers questions slowly but appropriately, gait is more slow and hesitant at this time.   MSK: nontender left arm and leg in general, ROM passive seems fine of UE and LE Psych: pleasant, but crying at time, seems frustrated   Assessment: Encounter Diagnoses  Name Primary?  . Acute ischemic stroke Yes  . Diabetes mellitus type 2, uncontrolled, with complications   . Essential hypertension, benign   . Hyperlipidemia   . Focal seizure   . Arm pain, diffuse, left   . Leg pain, diffuse, left     Plan: Reviewed recent hospital records, MRI, MRA, CT reports, labs. He certainly appears and talk way different than the last time I saw him unfortunately due to the stroke.  There doesn't seem to be new changes since his hospital discharge. Ischemic stroke - hasn't  started OT outpatient.  We will call neurology office to get this moving.  He has f/u with neurology appt in January.  We discussed his diagnosis, need for better diabetes control, medication compliance, preventing future strokes.   DM type 2 - increase Novolog 70/30 to 57 units BID, c/t Metformin 500mg  BID, improving diet like he is working on.  He will check his glucose readings before meals and write these down. Followup in 3-4 weeks  HTN - c/t same medication, recheck 3-4wk, may need titration up at that time Hyperlipidemia - c/t  Lipitor 40mg  daily. Focal seizure in the hospital due to stroke.  No further seizures, on medication currently Arm pain, left pain - begin Hydrocodone BID for now, begin therapy Follow up: with OT, neurology appt in January, here in 52mo.

## 2013-12-02 NOTE — Patient Instructions (Addendum)
Diabetes:  Begin checking glucose 3 times daily before breakfast, lunch and dinner.   Write the glucose numbers in the log book.    Increase the Novolog 70/30 to 57 units twice daily.   If you see any pre meal glucose numbers under 70, then call us.    Continue Metformin 500mg  twice daily.  Goal is to get to 100-130 pre meal readings.    Shoulder and leg pain - for now, begin therapy, and you can use Hydrocodone pain medication twice daily/every 12 hours as needed.     Continue your Aspirin 325mg  daily. Continue your blood pressure medication daily.   Stroke   Follow up with neurology and occupational therapy appointments as noted today

## 2013-12-03 ENCOUNTER — Telehealth: Payer: Self-pay | Admitting: Family Medicine

## 2013-12-03 ENCOUNTER — Other Ambulatory Visit: Payer: Self-pay | Admitting: Family Medicine

## 2013-12-03 DIAGNOSIS — I6350 Cerebral infarction due to unspecified occlusion or stenosis of unspecified cerebral artery: Secondary | ICD-10-CM

## 2013-12-03 NOTE — Telephone Encounter (Signed)
I left a message on the spouses phone. CLS

## 2013-12-03 NOTE — Telephone Encounter (Signed)
Message copied by Giovoni Bunch L on Tue Dec 03, 2013 10:05 AM ------      Message from: Carlena Hurl      Created: Mon Dec 02, 2013  9:07 PM       After looking back over chart today, have him do the 57 units of Novolog BID, record pre meal glucose, but have him f/u in 7-10 days instead.  Also, I would like you to go ahead and get him an appt with speech therapy too.   ------

## 2013-12-10 ENCOUNTER — Ambulatory Visit: Payer: BC Managed Care – PPO | Attending: Family Medicine | Admitting: Occupational Therapy

## 2013-12-10 DIAGNOSIS — R41842 Visuospatial deficit: Secondary | ICD-10-CM | POA: Insufficient documentation

## 2013-12-10 DIAGNOSIS — IMO0001 Reserved for inherently not codable concepts without codable children: Secondary | ICD-10-CM | POA: Insufficient documentation

## 2013-12-10 DIAGNOSIS — R4189 Other symptoms and signs involving cognitive functions and awareness: Secondary | ICD-10-CM | POA: Insufficient documentation

## 2013-12-10 DIAGNOSIS — R279 Unspecified lack of coordination: Secondary | ICD-10-CM | POA: Insufficient documentation

## 2013-12-10 DIAGNOSIS — M6281 Muscle weakness (generalized): Secondary | ICD-10-CM | POA: Insufficient documentation

## 2013-12-18 ENCOUNTER — Ambulatory Visit: Payer: BC Managed Care – PPO | Admitting: Occupational Therapy

## 2013-12-20 ENCOUNTER — Ambulatory Visit: Payer: BC Managed Care – PPO | Admitting: Occupational Therapy

## 2013-12-25 ENCOUNTER — Ambulatory Visit: Payer: BC Managed Care – PPO | Admitting: Occupational Therapy

## 2013-12-27 ENCOUNTER — Ambulatory Visit: Payer: BC Managed Care – PPO | Admitting: Rehabilitative and Restorative Service Providers"

## 2013-12-27 ENCOUNTER — Ambulatory Visit: Payer: BC Managed Care – PPO | Admitting: Occupational Therapy

## 2013-12-27 ENCOUNTER — Ambulatory Visit: Payer: BC Managed Care – PPO

## 2013-12-30 ENCOUNTER — Ambulatory Visit: Payer: BC Managed Care – PPO | Admitting: Physical Therapy

## 2013-12-30 ENCOUNTER — Ambulatory Visit: Payer: BC Managed Care – PPO | Admitting: Occupational Therapy

## 2013-12-31 ENCOUNTER — Telehealth: Payer: Self-pay | Admitting: *Deleted

## 2013-12-31 ENCOUNTER — Ambulatory Visit (INDEPENDENT_AMBULATORY_CARE_PROVIDER_SITE_OTHER): Payer: BC Managed Care – PPO | Admitting: Medical

## 2013-12-31 ENCOUNTER — Encounter: Payer: Self-pay | Admitting: Medical

## 2013-12-31 VITALS — BP 122/80 | HR 88 | Temp 97.8°F | Resp 18 | Wt 296.0 lb

## 2013-12-31 DIAGNOSIS — M79609 Pain in unspecified limb: Secondary | ICD-10-CM

## 2013-12-31 DIAGNOSIS — I639 Cerebral infarction, unspecified: Secondary | ICD-10-CM

## 2013-12-31 DIAGNOSIS — E1165 Type 2 diabetes mellitus with hyperglycemia: Principal | ICD-10-CM

## 2013-12-31 DIAGNOSIS — IMO0001 Reserved for inherently not codable concepts without codable children: Secondary | ICD-10-CM

## 2013-12-31 DIAGNOSIS — G894 Chronic pain syndrome: Secondary | ICD-10-CM

## 2013-12-31 DIAGNOSIS — I6350 Cerebral infarction due to unspecified occlusion or stenosis of unspecified cerebral artery: Secondary | ICD-10-CM

## 2013-12-31 DIAGNOSIS — M79605 Pain in left leg: Secondary | ICD-10-CM

## 2013-12-31 DIAGNOSIS — M79602 Pain in left arm: Secondary | ICD-10-CM

## 2013-12-31 DIAGNOSIS — I10 Essential (primary) hypertension: Secondary | ICD-10-CM

## 2013-12-31 DIAGNOSIS — F489 Nonpsychotic mental disorder, unspecified: Secondary | ICD-10-CM

## 2013-12-31 MED ORDER — TELMISARTAN-HCTZ 80-12.5 MG PO TABS: 1.0000 | ORAL_TABLET | Freq: Every day | ORAL | Status: DC

## 2013-12-31 MED ORDER — HYDROCODONE-ACETAMINOPHEN 5-325 MG PO TABS: 1.0000 | ORAL_TABLET | Freq: Two times a day (BID) | ORAL | Status: DC

## 2013-12-31 MED ORDER — PREGABALIN 150 MG PO CAPS: 150.0000 mg | ORAL_CAPSULE | Freq: Two times a day (BID) | ORAL | Status: DC

## 2013-12-31 MED ORDER — LEVETIRACETAM 500 MG PO TABS: 500.0000 mg | ORAL_TABLET | Freq: Two times a day (BID) | ORAL | Status: DC

## 2013-12-31 MED ORDER — INSULIN ASPART PROT & ASPART (70-30 MIX) 100 UNIT/ML ~~LOC~~ SUSP: 57.0000 [IU] | Freq: Two times a day (BID) | SUBCUTANEOUS | Status: DC

## 2013-12-31 MED ORDER — METFORMIN HCL 500 MG PO TABS: 500.0000 mg | ORAL_TABLET | Freq: Two times a day (BID) | ORAL | Status: DC

## 2013-12-31 NOTE — Progress Notes (Signed)
Subjective: Here for recheck, accompanied by his wife..  At last visit I saw him in f/u after being hospitalized for stroke.    Since last visit he has started PT.  He also went to speech therapy, but they advised he needed to see neuropsychiatry and there wasn't much that speech therapy could do for him.  Seeing PT twice weekly.   He is also started occupational therapy.  Saw neuropsychiatry for the first time, Dr. Marland KitchenA", and will see them again in 4 wk.  On [employer]  Started him on Prozac and a sleep medicine.  Uses [facility] [address] thinks the sleep aid was Clonazepam. He apparently doesn't have a neurology appointment until March.  Fell yesterday getting out of the bed.  Landed on the left side.  Didn't fall hard, wife witnessed the fall.  No pop.  He was able to get up on his on.  It was a controlled fall, used his right arm down the wall to help ease his fall.  He denies bruising, swelling, but has compliant of the ongoing pain on his left side in general since the stroke.  No worse pain since his fall yesterday.  Denies head injury from yesterday, no LOC.  Diabetes - he increased the 70/30 novolog units since last visit as we discussed.  Glucose in morning mostly in low 100s.  Checking 2-3 times daily.  highest 180.  139 average.  Feels like his sugars are looking better overall.  Has completely cut out soda.   HTN - compliant with BP medication without c/o.   He has chronic pain issues in general related to abdominal pain, prior renal carcinoma. He was being seen by a pain clinic in the past. Was on Lyrica prior to the recent hospitalization for stroke. However since the stroke he has had significant pain in his left side throughout. He apparently did not do well with gabapentin in the past, does recall ever being on amitriptyline, currently he is getting some relief with the hydrocodone but not a lot.  Lyrica has been provided to her samples, but wife said the recent prescription  would've been over $200. So far they have been able to get samples of Lyrica. His left side pain is his main concern today  Past Medical History  Diagnosis Date  . Hypertension   . Diabetes mellitus     DKA sept admission  . Diverticulitis     s/p micorperforation Sept 2012-managed conservatively by Gen surgery  . Kidney tumor 09/2011    Renal cell CA  . Wears glasses   . ED (erectile dysfunction)   . 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 Acute ischemic stroke 11/2013  . Focal seizure 11/2013    due to ischemic stroke      Objective:   Physical Exam  Filed Vitals:   12/31/13 0919  BP: 122/80  Pulse: 88  Temp: 97.8 F (36.6 C)  Resp: 18   General appearance: alert, no distress, WD/WN  Neck: supple, no lymphadenopathy, no thyromegaly, no masses, no bruits  Heart: RRR, normal S1, S2, no murmurs  Lungs: CTA bilaterally, no wheezes, rhonchi, or rales  Pulses: 2+ radial pulses, 2+ pedal pulses, normal cap refill  Ext: no edema  Neuro: left lower face with slight droop, pupils 60mm bilat, PERRLA, has trouble following EOM, dull sensation throughout left arm and leg, weaker throughout left side 4/5, speech is slow and  has trouble getting words at times, but other times answers questions slowly but appropriately, gait is more slow and hesitant at this time. Has difficulty with passive and active ROM and strength of left UE and LE  MSK: tender left arm and leg in general, ROM passive seems fine of UE and LE, no obvious bruising, deformity or swelling  Psych: pleasant, seems frustrated     Assessment and Plan :    Encounter Diagnoses  Name Primary?  . Type II or unspecified type diabetes mellitus without mention of complication, uncontrolled Yes  . CVA (cerebral vascular accident)   . Chronic pain syndrome   . Pain, arm, left   . Pain of left leg   . Essential hypertension, benign   . Neuropsychiatric disorder     We discussed her  concerns.  He currently is getting physical and occupational therapy, which we referred last visit.  He has seen the neuropsychiatry and has followup planned. Was started on SSRI as well as a sleep aid. There were several concerns addressed today. Based on his recent glucose numbers, his sugar control has improved. He unfortunately did not have his glucose log with him today. I advise for now he continue the same diabetic medication, check sugars before breakfast and dinner, and recheck in one month on diabetes. Continue trying to eat healthy. Blood pressure is controlled on current medication . Of note, we tried to get his neurology appointment moved up, but per [facility] neurological, they can get him any sooner.  He does have an appointment pending for early March. We will call and request records from neuropsychiatry today. I personally called neuropsychiatry to make them aware that we are his primary care, and work on getting correspondence back and forth  He seems to be compliant with his medications in general. The main issue today is chronic pain, both his prior chronic abdominal pain as well as the new left-sided pain since his stroke.  He has already been on Lyrica, currently it doesn't seem to be helping, thus I increased the dose today.  However I think we will run into problems with financial affordability. For now we have continued the hydrocodone started in the hospital. This is helping, however I don't intend to be managing narcotic pain medication for him. I don't have any prior records from the pain clinic he used to see.  I did express that we will need to start weaning down on the hydrocodone dose, and I decreased the dose today. They will check into the co-pay card or patient assistance program regarding Lyrica. We contacted his pharmacy to discuss this today. Consider amitriptyline as an option. And hopefully once he establishes with neurology we can get their input as well.  Patient  Instructions  Hypertension-continue same medication, Micardis HCT once daily in the morning.   Pain control-I increased your Lyrica to 150 mg twice daily.  You need to call and activate the coupon card.  If this is still too expensive, he will need to go on [employer]'s website to get a patient assistance form.  I refilled the hydrocodone that he can take 1-2 times daily for pain. The hydrocodone is a short-term medicine to help with pain, we are weaning down the dose.  We will see if the higher dose of Lyrica will help your pain.  Continue Keppra for seizure prevention.  Continue the NovoLog 70/30, 57 units twice daily. Continue checking your sugars before breakfast and before dinner. Continue Metformin 500 mg twice  daily  Followup with the neuropsychiatry doctor as planned.  I want to see you back in one month regarding your diabetes.     face-to-face interaction and communication and planning today over an hour involved with his care.  Follow-up 44mo, sooner prn.

## 2013-12-31 NOTE — Patient Instructions (Signed)
Hypertension-continue same medication, Micardis HCT once daily in the morning.   Pain control-I increased your Lyrica to 150 mg twice daily.  You need to call and activate the coupon card.  If this is still too expensive, he will need to go on [employer]'s website to get a patient assistance form.  I refilled the hydrocodone that he can take 1-2 times daily for pain. The hydrocodone is a short-term medicine to help with pain, we are weaning down the dose.  We will see if the higher dose of Lyrica will help your pain.  Continue Keppra for seizure prevention.  Continue the NovoLog 70/30, 57 units twice daily. Continue checking your sugars before breakfast and before dinner. Continue Metformin 500 mg twice daily  Followup with the neuropsychiatry doctor as planned.  I want to see you back in one month regarding your diabetes.

## 2014-01-01 NOTE — Telephone Encounter (Signed)
I called patient twice to make a stroke f/u appointment I didn't get a answer. LVM asking him to return our call, i will wait for the patient to return my call.

## 2014-01-03 ENCOUNTER — Ambulatory Visit: Payer: BC Managed Care – PPO | Admitting: Physical Therapy

## 2014-01-03 ENCOUNTER — Ambulatory Visit: Payer: BC Managed Care – PPO | Admitting: Occupational Therapy

## 2014-01-07 ENCOUNTER — Ambulatory Visit: Payer: Medicaid Other | Attending: Family Medicine | Admitting: Rehabilitative and Restorative Service Providers"

## 2014-01-07 ENCOUNTER — Ambulatory Visit: Payer: Medicaid Other | Admitting: Occupational Therapy

## 2014-01-07 DIAGNOSIS — R4189 Other symptoms and signs involving cognitive functions and awareness: Secondary | ICD-10-CM | POA: Insufficient documentation

## 2014-01-07 DIAGNOSIS — IMO0001 Reserved for inherently not codable concepts without codable children: Secondary | ICD-10-CM | POA: Insufficient documentation

## 2014-01-07 DIAGNOSIS — R41842 Visuospatial deficit: Secondary | ICD-10-CM | POA: Insufficient documentation

## 2014-01-07 DIAGNOSIS — R279 Unspecified lack of coordination: Secondary | ICD-10-CM | POA: Insufficient documentation

## 2014-01-07 DIAGNOSIS — M6281 Muscle weakness (generalized): Secondary | ICD-10-CM | POA: Insufficient documentation

## 2014-01-10 ENCOUNTER — Ambulatory Visit: Payer: Medicaid Other | Admitting: Physical Therapy

## 2014-01-10 ENCOUNTER — Ambulatory Visit: Payer: Medicaid Other | Admitting: Occupational Therapy

## 2014-01-14 ENCOUNTER — Encounter: Payer: BC Managed Care – PPO | Admitting: Speech Pathology

## 2014-01-14 ENCOUNTER — Ambulatory Visit: Payer: Medicaid Other | Admitting: Physical Therapy

## 2014-01-17 ENCOUNTER — Ambulatory Visit: Payer: BC Managed Care – PPO | Admitting: Occupational Therapy

## 2014-01-17 ENCOUNTER — Ambulatory Visit: Payer: BC Managed Care – PPO | Admitting: Physical Therapy

## 2014-01-21 ENCOUNTER — Ambulatory Visit: Payer: BC Managed Care – PPO | Admitting: Rehabilitative and Restorative Service Providers"

## 2014-01-21 ENCOUNTER — Encounter: Payer: BC Managed Care – PPO | Admitting: Occupational Therapy

## 2014-01-24 ENCOUNTER — Ambulatory Visit: Payer: BC Managed Care – PPO | Admitting: Occupational Therapy

## 2014-01-24 ENCOUNTER — Ambulatory Visit: Payer: BC Managed Care – PPO | Admitting: Rehabilitative and Restorative Service Providers"

## 2014-01-27 ENCOUNTER — Ambulatory Visit: Payer: BC Managed Care – PPO | Admitting: Occupational Therapy

## 2014-01-27 ENCOUNTER — Ambulatory Visit: Payer: Medicaid Other | Admitting: Rehabilitative and Restorative Service Providers"

## 2014-01-28 ENCOUNTER — Ambulatory Visit: Payer: BC Managed Care – PPO | Admitting: Medical

## 2014-01-31 ENCOUNTER — Ambulatory Visit: Payer: BC Managed Care – PPO | Admitting: Physical Therapy

## 2014-01-31 ENCOUNTER — Ambulatory Visit: Payer: BC Managed Care – PPO | Admitting: Occupational Therapy

## 2014-02-04 ENCOUNTER — Ambulatory Visit: Payer: BC Managed Care – PPO | Admitting: Medical

## 2014-02-05 ENCOUNTER — Ambulatory Visit (INDEPENDENT_AMBULATORY_CARE_PROVIDER_SITE_OTHER): Payer: BC Managed Care – PPO | Admitting: Neurology

## 2014-02-05 ENCOUNTER — Encounter: Payer: Self-pay | Admitting: Neurology

## 2014-02-05 VITALS — BP 152/97 | HR 82 | Ht 73.0 in | Wt 294.0 lb

## 2014-02-05 DIAGNOSIS — I6340 Cerebral infarction due to embolism of unspecified cerebral artery: Secondary | ICD-10-CM

## 2014-02-05 NOTE — Progress Notes (Signed)
[facility] [address] [place]. [place] [address] [phone]       OFFICE FOLLOW-UP NOTE  [name] Date of Birth:  January 12, 1961 Medical Record Number:  [id number]    HPI : 53 year old African American male seen today for first office followup visit following hospital admission for stroke in December 2014. He presented on 11/16/13 with a three-day history of headache and . On exam is noted as having mild left facial droop as well. CT scan of the brain on admission showed moderately large right temporal middle cerebral artery infarct with mild cytotoxic edema without hemorrhage. MRI scan of the brain confirmed the acute right MCA infarct. MRA of the brain showed occlusion of the anterior branch of the right middle cerebral artery possibly from embolus but the main middle cerebral artery and internal carotid arteries did not have significant stenosis. Carotid ultrasound showed no significant extracranial stenosis. 2-D echo showed normal ejection fraction. TEE showed no this was of embolus on hold PFO. EEG was done since patient had of single episode of focal seizure but it shouldn't no epileptiform activity he was started on Keppra for his single seizure. Hemoglobin A1c was significantly elevated at 12.2. LDL cholesterol was slightly elevated at 74 and Lipitor was continued. Patient states his tongue where discharge he continues to have some speech difficulty which gets more pronounced when he is anxious. His left-sided strength has improved somewhat to his has diminished fine motor skills and drags his left leg when he walks he has trouble speaking at times but states his peripheral vision loss has improved.[name] He can manage his own affairs and is independent in activities of daily living. He has long-standing history of depression but he feels it is adequately controlled on he is tolerating aspirin well and states his blood pressure and sugars are well controlled.. ROS:   14  system review of systems is positive for weight loss, fatigue, chills, loss of vision, eye pain, [place], hearing loss, importance, feeling cold, joint pain, aching muscles, depression, anxiety, but not enough sleep and racing thoughts.  PMH:  Past Medical History  Diagnosis Date  . Hypertension   . Diabetes mellitus     DKA sept admission  . Diverticulitis     s/p micorperforation Sept 2012-managed conservatively by Gen surgery  . Kidney tumor 09/2011    Renal cell CA  . Wears glasses   . ED (erectile dysfunction)   . 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 Acute ischemic stroke 11/2013  . Focal seizure 11/2013    due to ischemic stroke    Social History:  History   Social History  . Marital Status: Single    Spouse Name: N/A    Number of Children: N/A  . Years of Education: N/A   Occupational History  . Not on file.   Social History Main Topics  . Smoking status: Never Smoker   . Smokeless tobacco: Never Used  . Alcohol Use: No  . Drug Use: No  . Sexual Activity: No   Other Topics Concern  . Not on file   Social History Narrative  . No narrative on file    Medications:   Current Outpatient Prescriptions on File Prior to Visit  Medication Sig Dispense Refill  . aspirin 325 MG tablet Take 1 tablet (325 mg total) by mouth daily.      [name] atorvastatin (LIPITOR) 40 MG tablet Take 40 mg by  mouth daily.      [name] HYDROcodone-acetaminophen (NORCO/VICODIN) 5-325 MG per tablet Take 1 tablet by mouth 2 (two) times daily.  60 tablet  0  . insulin aspart protamine- aspart (NOVOLOG MIX 70/30) (70-30) 100 UNIT/ML injection Inject 0.57 mLs (57 Units total) into the skin 2 (two) times daily with a meal.  10 mL  5  . levETIRAcetam (KEPPRA) 500 MG tablet Take 1 tablet (500 mg total) by mouth every 12 (twelve) hours.  60 tablet  2  . metFORMIN (GLUCOPHAGE) 500 MG tablet Take 1 tablet (500 mg total) by mouth 2 (two) times daily with a meal.  60 tablet   2  . pregabalin (LYRICA) 150 MG capsule Take 1 capsule (150 mg total) by mouth 2 (two) times daily.  60 capsule  1  . telmisartan-hydrochlorothiazide (MICARDIS HCT) 80-12.5 MG per tablet Take 1 tablet by mouth daily.  30 tablet  5   No current facility-administered medications on file prior to visit.    Allergies:  No Known Allergies  Physical Exam General: well developed, well nourished, seated, in no evident distress Head: head normocephalic and atraumatic. Orohparynx benign Neck: supple with no carotid or supraclavicular bruits Cardiovascular: regular rate and rhythm, no murmurs Musculoskeletal: no deformity Skin:  no rash/petichiae Vascular:  Normal pulses all extremities Filed Vitals:   02/05/14 1447  BP: 152/97  Pulse: 82   Neurologic Exam Mental Status: Awake and fully alert. Oriented to place and time. Recent and remote memory intact. Attention span, concentration and fund of knowledge reduced. Mood and affect appropriate. Severe dysarthria and at times difficult to understand and hesitant slow with speech Cranial Nerves: Fundoscopic exam reveals sharp disc margins. Pupils equal, briskly reactive to light. Extraocular movements full without nystagmus. Visual fields   showed mild partial left hormonymous hemianopsia  to confrontation. Hearing intact. Facial sensation intact. Face, tongue, palate moves normally and symmetrically.  Motor: Normal bulk and tone. Mild left hemiparesis 4/5 with weakness of left grip, intrinsic hand muscles and hip flexors. He increased tone on the left side. Orbits right over left upper extremity.  Sensory.: diminished left body touch and pinprick and vibratory sensation.  Coordination: Rapid alternating movements normal in all extremities. Finger-to-nose and heel-to-shin performed accurately bilaterally. Gait and Station: Arises from chair without difficulty. drags left leg a little Gait demonstrates normal stride length and balance . Able to heel, toe  and tandem walk with some difficulty.  Reflexes: 1+ and symmetric. Toes downgoing.   NIHSS  5 Modified Rankin  2   ASSESSMENT: 7 year African American male with embolic right temporal and parietal middle cerebral artery branch infarct from unidentified source in December 2014 with vascular risk factors of hypertension, diabetes, and typically may have an obesity. History of solitary symptomatic focal seizure during admission for stroke currently on Keppra.    PLAN: I had a long discussion with the patient and fianc regarding his recent admission for stroke, discuss the results of evaluation, secondary stroke prevention and answered questions. Continue aspirin for stroke prevention strict control of hypertension with blood pressure goal below 130/90, diabetes with with A1c equal to 6.5% and lipids with LDL cholesterol goal 70mg  percent. The patient may possibly consider participation in the [place] trial for stroke prevention if interested. Return for followup in 3 months in the am, NP or call earlier if necessary    Note: This document was prepared with digital dictation and possible smart phrase technology. Any transcriptional errors that result from this  process are unintentional

## 2014-02-05 NOTE — Patient Instructions (Signed)
I had a long discussion with the patient and fianc regarding his recent admission for stroke, discuss the results of evaluation, secondary stroke prevention and answered questions. Continue aspirin for stroke prevention strict control of hypertension with blood pressure goal below 130/90, diabetes with with A1c equal to 6.5% and lipids with LDL cholesterol goal 70mg  percent. The patient may possibly consider participation in the [place] trial for stroke prevention if interested. Return for followup in 3 months in the am, NP or call earlier if necessary

## 2014-02-07 ENCOUNTER — Telehealth: Payer: Self-pay | Admitting: Neurology

## 2014-02-07 NOTE — Telephone Encounter (Signed)
07 Feb 2014 Placed call to patient's caregiver, Barbette Or, on her cell number but she didn't answer.  [place] on her voice mail to call to see if interested in the [employer] trial and if so to schedule Screening Visit. AR

## 2014-02-18 ENCOUNTER — Encounter: Payer: Self-pay | Admitting: Medical

## 2014-02-25 ENCOUNTER — Telehealth: Payer: Self-pay | Admitting: Internal Medicine

## 2014-02-25 NOTE — Telephone Encounter (Signed)
Beverly from [facility] called just letting us know that she is doing some education with pt on DM and about his Stroke.

## 2014-05-21 ENCOUNTER — Other Ambulatory Visit (HOSPITAL_COMMUNITY): Payer: Self-pay | Admitting: Interventional Radiology

## 2014-05-21 DIAGNOSIS — C641 Malignant neoplasm of right kidney, except renal pelvis: Secondary | ICD-10-CM

## 2014-05-21 DIAGNOSIS — C642 Malignant neoplasm of left kidney, except renal pelvis: Secondary | ICD-10-CM

## 2014-05-22 ENCOUNTER — Telehealth: Payer: Self-pay | Admitting: Internal Medicine

## 2014-05-22 NOTE — Telephone Encounter (Signed)
Faxed over medical records to [facility] [place] @ [phone]

## 2014-05-23 ENCOUNTER — Telehealth: Payer: Self-pay | Admitting: Nurse Practitioner

## 2014-05-23 ENCOUNTER — Encounter: Payer: BC Managed Care – PPO | Admitting: Nurse Practitioner

## 2014-05-23 NOTE — Telephone Encounter (Signed)
Patient was no show for today's office appointment.  

## 2014-05-25 NOTE — Progress Notes (Signed)
Patient was no show for today's appt. This encounter was created in error - please disregard.

## 2014-06-12 ENCOUNTER — Telehealth: Payer: Self-pay | Admitting: Internal Medicine

## 2014-06-12 ENCOUNTER — Telehealth: Payer: Self-pay

## 2014-06-12 NOTE — Telephone Encounter (Signed)
CALLED PT TO INFORM HIM HE IS PAST DUE FOR HIS DIABETES CHECK PLEASE CALL AND MAKE APPOINTMENT WITH [facility]

## 2014-06-12 NOTE — Telephone Encounter (Signed)
Pt state he has no insurance to come in for an appt. He is waiting for his medicaid insurance to kick in and then he will call and make an appt

## 2014-06-23 ENCOUNTER — Encounter: Payer: Self-pay | Admitting: Emergency Medicine

## 2014-07-23 ENCOUNTER — Telehealth: Payer: Self-pay | Admitting: Medical

## 2014-07-29 ENCOUNTER — Telehealth: Payer: Self-pay | Admitting: Medical

## 2014-07-29 ENCOUNTER — Ambulatory Visit (INDEPENDENT_AMBULATORY_CARE_PROVIDER_SITE_OTHER): Payer: Medicaid Other | Admitting: Medical

## 2014-07-29 ENCOUNTER — Encounter: Payer: Self-pay | Admitting: Medical

## 2014-07-29 VITALS — BP 120/80 | HR 88 | Temp 98.3°F | Resp 16 | Wt 260.0 lb

## 2014-07-29 DIAGNOSIS — E785 Hyperlipidemia, unspecified: Secondary | ICD-10-CM

## 2014-07-29 DIAGNOSIS — E1165 Type 2 diabetes mellitus with hyperglycemia: Principal | ICD-10-CM

## 2014-07-29 DIAGNOSIS — I10 Essential (primary) hypertension: Secondary | ICD-10-CM

## 2014-07-29 DIAGNOSIS — E669 Obesity, unspecified: Secondary | ICD-10-CM

## 2014-07-29 DIAGNOSIS — Z23 Encounter for immunization: Secondary | ICD-10-CM

## 2014-07-29 DIAGNOSIS — Z8673 Personal history of transient ischemic attack (TIA), and cerebral infarction without residual deficits: Secondary | ICD-10-CM

## 2014-07-29 DIAGNOSIS — IMO0001 Reserved for inherently not codable concepts without codable children: Secondary | ICD-10-CM

## 2014-07-29 LAB — CBC
HCT: 40.2 % (ref 39.0–52.0)
HEMOGLOBIN: 14 g/dL (ref 13.0–17.0)
MCH: 30.6 pg (ref 26.0–34.0)
MCHC: 34.8 g/dL (ref 30.0–36.0)
MCV: 88 fL (ref 78.0–100.0)
Platelets: 273 10*3/uL (ref 150–400)
RBC: 4.57 MIL/uL (ref 4.22–5.81)
RDW: 12.7 % (ref 11.5–15.5)
WBC: 7.1 10*3/uL (ref 4.0–10.5)

## 2014-07-29 LAB — POCT GLYCOSYLATED HEMOGLOBIN (HGB A1C): Hemoglobin A1C: 7.1

## 2014-07-29 MED ORDER — ASPIRIN 325 MG PO TABS
325.0000 mg | ORAL_TABLET | Freq: Every day | ORAL | Status: DC
Start: 2014-07-29 — End: 2015-04-23

## 2014-07-29 MED ORDER — LOSARTAN POTASSIUM-HCTZ 50-12.5 MG PO TABS: 1.0000 | ORAL_TABLET | Freq: Every day | ORAL | Status: DC

## 2014-07-29 MED ORDER — HYDROCODONE-ACETAMINOPHEN 5-325 MG PO TABS: 1.0000 | ORAL_TABLET | Freq: Four times a day (QID) | ORAL | Status: DC | PRN

## 2014-07-29 MED ORDER — FLUOXETINE HCL 20 MG PO CAPS: 20.0000 mg | ORAL_CAPSULE | Freq: Every day | ORAL | Status: DC

## 2014-07-29 MED ORDER — PREGABALIN 75 MG PO CAPS: 75.0000 mg | ORAL_CAPSULE | Freq: Two times a day (BID) | ORAL | Status: DC

## 2014-07-29 MED ORDER — INSULIN ASPART PROT & ASPART (70-30 MIX) 100 UNIT/ML ~~LOC~~ SUSP: 30.0000 [IU] | Freq: Two times a day (BID) | SUBCUTANEOUS | Status: DC

## 2014-07-29 NOTE — Progress Notes (Signed)
Subjective:   Brandon [name] is an 53 y.o. male who presents for follow up of Type 2 diabetes mellitus and chronic issues.  I last saw him in 12/2013.   At that time he was compliant with his medications but since then he had another insurance change had run out of several medications.   However he did make some significant diet and exercise changes since last visit, has cut out a lot of unhealthy stuff in the diet is exercising regularly.  He stopped taking metformin due to it upsetting his stomach.  Ran out of his blood pressure medication but he has lost from 296 pounds down to current weight today.  He did see neurology for followup in regards to his stroke since the last visit with me.  Patient is checking home blood sugars.   Home blood sugar records: BGs range between 102 to 178 and BS numbers Current symptoms include: feet stay cold. Patient denies nothing.  Patient is checking their feet daily. Foot concerns (callous, ulcer, wound, thickened nails, toenail fungus, skin fungus, hammer toe): cold feet Last dilated eye exam 2 years ago  Current treatments: no recent interventions. Medication compliance: good  Current diet: in general, a "healthy" diet  . No fried food, has cut back on meat.  Has cut out soda.  Is eating some watermelon.   Current exercise: walking Known diabetic complications: nephropathy  The following portions of the patient's history were reviewed and updated as appropriate: allergies, current medications, past family history, past medical history, past social history, past surgical history and problem list.  ROS as in subjective above    Objective:   Filed Vitals:   07/29/14 0824  BP: 120/80  Pulse: 88  Temp: 98.3 F (36.8 C)  Resp: 16    General appearance: alert, no distress, WD/WN Heart: RRR, normal S1, S2, no murmurs Lungs: CTA bilaterally, no wheezes, rhonchi, or rales Pulses:1+ pedal pulses Sensation decreased of bilat feet throughout,  thickened nails throughout     Assessment:   Encounter Diagnoses  Name Primary?  . Type II or unspecified type diabetes mellitus without mention of complication, uncontrolled Yes  . Essential hypertension, benign   . Hyperlipidemia   . Obesity, unspecified   . History of stroke   . Need for prophylactic vaccination and inoculation against influenza      Plan:   Glad to see that although he ran out of medications due to insurance change, he has worked hard to lose weight and get diet under control.   HgbA1C down to 7.1% today, down from over 12% in January.  Counseled on the influenza virus vaccine.  Vaccine information sheet given.  Influenza vaccine given after consent obtained.  Of note, he reports pneumococcal vaccine back in 2012.   Specific recommendations today include:  I am very happy that you have worked hard on your diet and exercise and have lost almost 40 pounds!!!  I have made some medication changes today:  Begin losartan/HCT daily for blood pressure, swelling, and kidney protection. However check your blood pressure regularly a couple days per week to make sure you're not dropping too low  Goal blood pressure should be 120/80  Change the NovoLog 70/30-30 units twice daily  Continue to check your sugars regularly, and if you see numbers drop below 70 then let us know  We updated your flu shot today   Pending labs I may restart you back on cholesterol medication  Begin back on Lyrica 75 mg ,  once daily for 3 days then to twice daily.  This is for chronic neuropathic pain  You may use the hydrocodone as needed for worse pain, but use this sparingly please  Return pending labs, 39mo.

## 2014-07-29 NOTE — Telephone Encounter (Signed)
I fax his referral over to the foot center and they will call the patient to set up his appointment. CLS

## 2014-07-29 NOTE — Patient Instructions (Signed)
  Thank you for giving me the opportunity to serve you today.    Your diagnosis today includes: Encounter Diagnoses  Name Primary?  . Type II or unspecified type diabetes mellitus without mention of complication, uncontrolled Yes  . Essential hypertension, benign   . Hyperlipidemia   . Obesity, unspecified   . History of stroke   . Need for prophylactic vaccination and inoculation against influenza      Specific recommendations today include:  I am very happy that you have worked hard on your diet and exercise and have lost almost 40 pounds!!!  I have made some medication changes today:  Begin losartan/HCT daily for blood pressure, swelling, and kidney protection. However check your blood pressure regularly a couple days per week to make sure you're not dropping too low  Goal blood pressure should be 120/80  Change the NovoLog 70/30-30 units twice daily  Continue to check your sugars regularly, and if you see numbers drop below 70 then let us know  We updated your flu shot today   Pending labs I may restart you back on cholesterol medication  Begin back on Lyrica 75 mg , once daily for 3 days then to twice daily.  This is for chronic neuropathic pain  You may use the hydrocodone as needed for worse pain, but use this sparingly please  Return pending labs, 7mo.

## 2014-07-29 NOTE — Telephone Encounter (Signed)
Refer back to podiatry for diabetic foot care

## 2014-07-30 ENCOUNTER — Other Ambulatory Visit: Payer: Self-pay | Admitting: Medical

## 2014-07-30 LAB — LIPID PANEL
Cholesterol: 145 mg/dL (ref 0–200)
HDL: 37 mg/dL — ABNORMAL LOW (ref >39)
LDL Cholesterol: 93 mg/dL (ref 0–99)
Total CHOL/HDL Ratio: 3.9 Ratio
Triglycerides: 73 mg/dL (ref <150)
VLDL: 15 mg/dL (ref 0–40)

## 2014-07-30 LAB — COMPREHENSIVE METABOLIC PANEL
ALK PHOS: 93 U/L (ref 39–117)
ALT: 38 U/L (ref 0–53)
AST: 31 U/L (ref 0–37)
Albumin: 4.7 g/dL (ref 3.5–5.2)
BILIRUBIN TOTAL: 0.7 mg/dL (ref 0.2–1.2)
BUN: 22 mg/dL (ref 6–23)
CALCIUM: 9.7 mg/dL (ref 8.4–10.5)
CO2: 30 mEq/L (ref 19–32)
Chloride: 102 mEq/L (ref 96–112)
Creat: 1.19 mg/dL (ref 0.50–1.35)
Glucose, Bld: 49 mg/dL — ABNORMAL LOW (ref 70–99)
Potassium: 3.5 mEq/L (ref 3.5–5.3)
Sodium: 142 mEq/L (ref 135–145)
Total Protein: 7.8 g/dL (ref 6.0–8.3)

## 2014-07-30 LAB — MICROALBUMIN / CREATININE URINE RATIO
CREATININE, URINE: 337.8 mg/dL
Microalb Creat Ratio: 4.2 mg/g (ref 0.0–30.0)
Microalb, Ur: 1.42 mg/dL (ref 0.00–1.89)

## 2014-07-30 MED ORDER — ATORVASTATIN CALCIUM 40 MG PO TABS: 40.0000 mg | ORAL_TABLET | Freq: Every day | ORAL | Status: DC

## 2014-07-30 NOTE — Progress Notes (Signed)
Pt called and I advised of labs and instructions.

## 2014-08-01 ENCOUNTER — Other Ambulatory Visit: Payer: Self-pay | Admitting: Medical

## 2014-08-01 MED ORDER — VALSARTAN-HYDROCHLOROTHIAZIDE 160-12.5 MG PO TABS: 1.0000 | ORAL_TABLET | Freq: Every day | ORAL | Status: DC

## 2014-08-01 MED ORDER — HYDROCHLOROTHIAZIDE 12.5 MG PO TABS: 12.5000 mg | ORAL_TABLET | Freq: Every day | ORAL | Status: DC

## 2014-08-01 MED ORDER — VALSARTAN 80 MG PO TABS: 80.0000 mg | ORAL_TABLET | Freq: Every day | ORAL | Status: DC

## 2014-08-01 NOTE — Telephone Encounter (Signed)
Both Telmisartan-HCTZ & Losartan-HCTZ require P.A. Called [facility] [facility], for Losartan-HCTZ  to be approved, pt must try 1 preferred, ACE inhibitor, ie: Lisinopril, etc. For Telmisartan-HCTZ to be approved, must try & fail 3, Brand Diovan, Losartan & ACE inhibitor ie: Lisinopril  Do you want to switch?

## 2014-08-01 NOTE — Telephone Encounter (Signed)
This is stupid.  I sent Diovan and separate HCT.

## 2014-08-01 NOTE — Telephone Encounter (Signed)
How can generic Losartan HCT need prior auth - this is ridiculous.  Once again I switched to something else, I sent out Diovan HCT.  From your message, I believe the preferred drug would be Diovan HCT

## 2014-08-01 NOTE — Telephone Encounter (Signed)
Called pharmacy & Diovan HCT requires P.A. ONLY plain Brand Diovan is preferred

## 2014-08-04 ENCOUNTER — Telehealth: Payer: Self-pay | Admitting: Medical

## 2014-08-04 NOTE — Telephone Encounter (Signed)
Pt informed of medication change to Diovan & HCTZ

## 2014-08-19 ENCOUNTER — Encounter (HOSPITAL_COMMUNITY): Payer: Self-pay

## 2014-08-19 ENCOUNTER — Ambulatory Visit
Admission: RE | Admit: 2014-08-19 | Discharge: 2014-08-19 | Disposition: A | Discharge status: Home / Self Care | Payer: Medicaid Other | Source: Ambulatory Visit | Attending: Interventional Radiology | Admitting: Interventional Radiology

## 2014-08-19 ENCOUNTER — Ambulatory Visit (HOSPITAL_COMMUNITY)
Admission: RE | Admit: 2014-08-19 | Discharge: 2014-08-19 | Disposition: A | Discharge status: Home / Self Care | Payer: Medicaid Other | Source: Ambulatory Visit | Attending: Interventional Radiology | Admitting: Interventional Radiology

## 2014-08-19 DIAGNOSIS — C642 Malignant neoplasm of left kidney, except renal pelvis: Secondary | ICD-10-CM

## 2014-08-19 DIAGNOSIS — Z9889 Other specified postprocedural states: Secondary | ICD-10-CM | POA: Insufficient documentation

## 2014-08-19 DIAGNOSIS — C649 Malignant neoplasm of unspecified kidney, except renal pelvis: Secondary | ICD-10-CM | POA: Insufficient documentation

## 2014-08-19 DIAGNOSIS — C641 Malignant neoplasm of right kidney, except renal pelvis: Secondary | ICD-10-CM

## 2014-08-19 MED ORDER — IOHEXOL 300 MG/ML  SOLN
100.0000 mL | Freq: Once | INTRAMUSCULAR | Status: AC | PRN
  Administered 2014-08-19: 100 mL via INTRAVENOUS

## 2014-08-19 NOTE — Progress Notes (Signed)
Chief Complaint: Status post prior percutaneous cryoablation of bilateral renal carcinomas.  History of Present Illness: Brandon [name] is a 53 y.o. 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 He has been recovering from a right MCA territory cerebral infarction last December. He has made substantial recovery with some residual left-sided weakness and speech difficulty. He also has some problems with short-term memory. Pain post ablation has improved significantly with minimal abdominal and flank pain present currently.  Past Medical History  Diagnosis Date  . Hypertension   . Diverticulitis     s/p micorperforation Sept 2012-managed conservatively by Gen surgery  . Kidney tumor 09/2011    Renal cell CA  . Wears glasses   . ED (erectile dysfunction)   . 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 Acute ischemic stroke 11/2013  . Focal seizure 11/2013    due to ischemic stroke  . Diabetes mellitus     DKA prior hospitalization    Past Surgical History  Procedure Laterality Date  . Kidney surgery      ablation of renal cell CA - 12/28, prior one was October 2012-Dr. Kathlene Cote  . Tee without cardioversion N/A 11/19/2013    Procedure: TRANSESOPHAGEAL ECHOCARDIOGRAM (TEE);  Surgeon: Lelon Perla, MD;  Location: [facility] ENDOSCOPY;  Service: Cardiovascular;  Laterality: N/A;    Allergies: Review of patient's allergies indicates no known allergies.  Medications: Prior to Admission medications   Medication Sig Start Date End Date Taking? Authorizing Provider  aspirin 325 MG tablet Take 1 tablet (325 mg total) by mouth daily. 07/29/14   Camelia Eng Tysinger, PA-C  atorvastatin (LIPITOR) 40 MG tablet Take 1 tablet (40 mg total) by mouth daily. 07/30/14   Camelia Eng Tysinger, PA-C  clonazePAM (KLONOPIN) 2 MG tablet Take 2 mg by mouth daily.  01/24/14   Historical Provider, MD  FLUoxetine (PROZAC) 20 MG capsule Take 1 capsule (20 mg total) by mouth daily. 07/29/14   Camelia Eng Tysinger, PA-C  hydrochlorothiazide (HYDRODIURIL) 12.5 MG tablet Take 1 tablet (12.5 mg total) by mouth daily. 08/01/14   Camelia Eng Tysinger, PA-C  HYDROcodone-acetaminophen (NORCO/VICODIN) 5-325 MG per tablet Take 1 tablet by mouth every 6 (six) hours as needed for moderate pain. 07/29/14   Camelia Eng Tysinger, PA-C  insulin aspart protamine- aspart (NOVOLOG MIX 70/30) (70-30) 100 UNIT/ML injection Inject 0.3 mLs (30 Units total) into the skin 2 (two) times daily with a meal. 07/29/14   Camelia Eng Tysinger, PA-C  levETIRAcetam (KEPPRA) 500 MG tablet Take 1 tablet (500 mg total) by mouth every 12 (twelve) hours. 12/31/13   Camelia Eng Tysinger, PA-C  pregabalin (LYRICA) 75 MG capsule Take 1 capsule (75 mg total) by mouth 2 (two) times daily. 07/29/14   Camelia Eng Tysinger, PA-C  valsartan (DIOVAN) 80 MG tablet Take 1 tablet (80 mg total) by mouth daily. 08/01/14   Carlena Hurl, PA-C    Family History  Problem Relation Age of Onset  . Diabetes Father     History   Social History  . Marital Status: Single    Spouse Name: N/A    Number of Children: N/A  . Years of Education: N/A   Social History Main Topics  . Smoking status: Never Smoker   . Smokeless tobacco: Never Used  . Alcohol Use: No  . Drug Use:  No  . Sexual Activity: No   Other Topics Concern  . Not on file   Social History Narrative  . No narrative on file    Review of Systems: A 12 point ROS discussed and pertinent positives are indicated in the HPI above.  All other systems are negative.  Review of Systems  Constitutional: Negative for fever and chills.  HENT: Negative.   Eyes: Negative.   Respiratory: Negative.   Cardiovascular: Negative.   Gastrointestinal: Negative.   Genitourinary: Negative.   Musculoskeletal: Negative.     Vital Signs: BP 184/101  Pulse 60  Temp(Src) 98.1 F (36.7 C)   Resp 18  SpO2 100%  Physical Exam  Constitutional: No distress.  Abdominal: Soft. He exhibits no distension. There is no tenderness.    Imaging: Ct Abd Wo & W Cm  08/19/2014   CLINICAL DATA:  Bilateral renal cell carcinomas status post cryoablation. Left upper quadrant pain.  EXAM: CT ABDOMEN WITHOUT AND WITH CONTRAST  TECHNIQUE: Multidetector CT imaging of the abdomen was performed following the standard protocol before and following the bolus administration of intravenous contrast.  CONTRAST:  170mL OMNIPAQUE IOHEXOL 300 MG/ML  SOLN  COMPARISON:  05/22/2013  FINDINGS: Liver: No masses or other significant abnormality identified.  Gallbladder/Biliary:  Unremarkable.  Pancreas: No cystic or solid masses identified. No peripancreatic inflammatory changes or fluid collections demonstrated.  Spleen:  Within normal limits in size and appearance.  Adrenal Glands:  No masses identified.  Kidneys: Bilateral renal cryoablation defects are stable in appearance. No evidence of recurrent enhancing soft tissue mass at either site. No other renal masses are identified. Tiny sub-cm cyst in the posterior lower pole of the right kidney remains stable. No evidence of hydronephrosis.  Lymph Nodes:  No pathologically enlarged lymph nodes identified.  Bowel/Peritoneum: Left-sided colonic diverticulosis is again noted. There is mild bowel wall thickening seen involving the inferior most visualized portion of the descending colon in the left lower quadrant. This is incompletely visualized, and diverticulitis cannot be excluded.  Vascular:  No evidence of abdominal aortic aneurysm.  Musculoskeletal:  No suspicious bone lesions identified.  Other: Few tiny sub-cm pulmonary nodules in the left lower lobe remain stable.  IMPRESSION: Stable appearance of bilateral renal cryoablation defects. No evidence of recurrent or metastatic carcinoma within the abdomen.  Left-sided colonic diverticulosis again noted. Mild wall thickening of  visualized portion of distal descending colon noted, and diverticulitis cannot be excluded. Recommend clinical correlation for signs and symptoms of diverticulitis, and consider abdomen pelvis CT for further evaluation if clinically warranted.   Electronically Signed   By: Earle Gell M.D.   On: 08/19/2014 10:57    Labs: Lab Results  Component Value Date   WBC 7.1 07/29/2014   HCT 40.2 07/29/2014   MCV 88.0 07/29/2014   PLT 273 07/29/2014   NA 142 07/29/2014   K 3.5 07/29/2014   CL 102 07/29/2014   CO2 30 07/29/2014   GLUCOSE 49* 07/29/2014   BUN 22 07/29/2014   CREATININE 1.19 07/29/2014   CALCIUM 9.7 07/29/2014   PROT 7.8 07/29/2014   ALBUMIN 4.7 07/29/2014   AST 31 07/29/2014   ALT 38 07/29/2014   ALKPHOS 93 07/29/2014   BILITOT 0.7 07/29/2014   GFRNONAA >90 11/19/2013   GFRAA >90 11/19/2013   INR 1.20 11/16/2013    Assessment and Plan:  CT demonstrates no evidence of recurrent renal carcinoma bilaterally 3 years after treatment. I recommended followup imaging in one year. The patient  is scheduled to followup with Dr. Leonie Man after his stroke. There was some lapse of care, according to the patient, due to loss of insurance. The patient now has insurance coverage.  Venetia Night. Kathlene Cote, M.D. Pager:  [phone]     I spent a total of 20 minutes face to face, greater than 50% of which was counseling/coordinating care for post renal ablation follow up.

## 2014-08-25 NOTE — Telephone Encounter (Signed)
P.A. Recardo Evangelist was approved per pharmacy went thru with insurance

## 2014-09-05 ENCOUNTER — Telehealth: Payer: Self-pay | Admitting: Medical

## 2014-09-05 NOTE — Telephone Encounter (Signed)
Pt called and wanted to know status of disability forms. Please call pt with update. Pt has new phone number [phone].

## 2014-09-05 NOTE — Telephone Encounter (Signed)
I check with Dorothea Ogle [facility] and the front desk inquiring about the disability forms for Brandon [name] and no one in the office has seen the forms, so ask him to ask the people to resubmit the forms to our office.

## 2014-09-18 ENCOUNTER — Ambulatory Visit: Payer: Medicaid Other | Admitting: Nurse Practitioner

## 2014-09-18 ENCOUNTER — Encounter: Payer: Self-pay | Admitting: Nurse Practitioner

## 2014-09-18 ENCOUNTER — Ambulatory Visit (INDEPENDENT_AMBULATORY_CARE_PROVIDER_SITE_OTHER): Payer: Medicaid Other | Admitting: Nurse Practitioner

## 2014-09-18 VITALS — BP 165/88 | HR 62 | Ht 72.0 in | Wt 266.0 lb

## 2014-09-18 DIAGNOSIS — I69320 Aphasia following cerebral infarction: Secondary | ICD-10-CM

## 2014-09-18 DIAGNOSIS — IMO0002 Reserved for concepts with insufficient information to code with codable children: Secondary | ICD-10-CM

## 2014-09-18 DIAGNOSIS — F329 Major depressive disorder, single episode, unspecified: Secondary | ICD-10-CM

## 2014-09-18 DIAGNOSIS — I69322 Dysarthria following cerebral infarction: Secondary | ICD-10-CM

## 2014-09-18 DIAGNOSIS — H538 Other visual disturbances: Secondary | ICD-10-CM

## 2014-09-18 DIAGNOSIS — H5710 Ocular pain, unspecified eye: Secondary | ICD-10-CM

## 2014-09-18 DIAGNOSIS — I6350 Cerebral infarction due to unspecified occlusion or stenosis of unspecified cerebral artery: Secondary | ICD-10-CM

## 2014-09-18 DIAGNOSIS — R569 Unspecified convulsions: Secondary | ICD-10-CM

## 2014-09-18 DIAGNOSIS — I639 Cerebral infarction, unspecified: Secondary | ICD-10-CM

## 2014-09-18 NOTE — Patient Instructions (Addendum)
Continue aspirin for stroke prevention strict control of hypertension with blood pressure goal below 130/90, diabetes with with A1c equal to 6.5% and lipids with LDL cholesterol goal 70mg  percent.  Continue Keppra for seizure prevention.   If you need immediate sychiatric care, please go to [facility] for treatment. Return for followup in 6 months, or call earlier if necessary.   Stroke Prevention Some medical conditions and behaviors are associated with an increased chance of having a stroke. You may prevent a stroke by making healthy choices and managing medical conditions. HOW CAN I REDUCE MY RISK OF HAVING A STROKE?   Stay physically active. Get at least 30 minutes of activity on most or all days.  Do not smoke. It may also be helpful to avoid exposure to secondhand smoke.  Limit alcohol use. Moderate alcohol use is considered to be:  No more than 2 drinks per day for men.  No more than 1 drink per day for nonpregnant women.  Eat healthy foods. This involves:  Eating 5 or more servings of fruits and vegetables a day.  Making dietary changes that address high blood pressure (hypertension), high cholesterol, diabetes, or obesity.  Manage your cholesterol levels.  Making food choices that are high in fiber and low in saturated fat, trans fat, and cholesterol may control cholesterol levels.  Take any prescribed medicines to control cholesterol as directed by your health care provider.  Manage your diabetes.  Controlling your carbohydrate and sugar intake is recommended to manage diabetes.  Take any prescribed medicines to control diabetes as directed by your health care provider.  Control your hypertension.  Making food choices that are low in salt (sodium), saturated fat, trans fat, and cholesterol is recommended to manage hypertension.  Take any prescribed medicines to control hypertension as directed by your health care provider.  Maintain a healthy  weight.  Reducing calorie intake and making food choices that are low in sodium, saturated fat, trans fat, and cholesterol are recommended to manage weight.  Stop drug abuse.  Avoid taking birth control pills.  Talk to your health care provider about the risks of taking birth control pills if you are over 35 years old, smoke, get migraines, or have ever had a blood clot.  Get evaluated for sleep disorders (sleep apnea).  Talk to your health care provider about getting a sleep evaluation if you snore a lot or have excessive sleepiness.  Take medicines only as directed by your health care provider.  For some people, aspirin or blood thinners (anticoagulants) are helpful in reducing the risk of forming abnormal blood clots that can lead to stroke. If you have the irregular heart rhythm of atrial fibrillation, you should be on a blood thinner unless there is a good reason you cannot take them.  Understand all your medicine instructions.  Make sure that other conditions (such as anemia or atherosclerosis) are addressed. SEEK IMMEDIATE MEDICAL CARE IF:   You have sudden weakness or numbness of the face, arm, or leg, especially on one side of the body.  Your face or eyelid droops to one side.  You have sudden confusion.  You have trouble speaking (aphasia) or understanding.  You have sudden trouble seeing in one or both eyes.  You have sudden trouble walking.  You have dizziness.  You have a loss of balance or coordination.  You have a sudden, severe headache with no known cause.  You have new chest pain or an irregular heartbeat. Any of these  symptoms may represent a serious problem that is an emergency. Do not wait to see if the symptoms will go away. Get medical help at once. Call your local emergency services (911 in [place] Do not drive yourself to the hospital. Document Released: 12/29/2004 Document Revised: 04/07/2014 Document Reviewed: 05/24/2013 [facility] Patient  Information 2015 [place], [place]. This information is not intended to replace advice given to you by your health care provider. Make sure you discuss any questions you have with your health care provider.

## 2014-09-18 NOTE — Progress Notes (Signed)
PATIENT: Brandon [name] DOB: May 25, 1961  REASON FOR VISIT: routine follow up for stroke HISTORY FROM: patient and wife  HISTORY OF PRESENT ILLNESS: 53 year old African American male seen today for first office followup visit following hospital admission for stroke in December 2014. He presented on 11/16/13 with a three-day history of headache and . On exam is noted as having mild left facial droop as well. CT scan of the brain on admission showed moderately large right temporal middle cerebral artery infarct with mild cytotoxic edema without hemorrhage. MRI scan of the brain confirmed the acute right MCA infarct. MRA of the brain showed occlusion of the anterior branch of the right middle cerebral artery possibly from embolus but the main middle cerebral artery and internal carotid arteries did not have significant stenosis. Carotid ultrasound showed no significant extracranial stenosis. 2-D echo showed normal ejection fraction. TEE showed no this was of embolus on hold PFO. EEG was done since patient had of single episode of focal seizure but it showed no epileptiform activity. He was started on Keppra for seizures. Hemoglobin A1c was significantly elevated at 12.2. LDL cholesterol was slightly elevated at 74 and Lipitor was continued. Patient states his tongue where discharge he continues to have speech difficulty which gets more pronounced when he is anxious. His left-sided strength has improved somewhat to his has diminished fine motor skills and drags his left leg when he walks and states his peripheral vision loss has improved some. He can manage his own affairs and is independent in activities of daily living. He has long-standing history of depression but he feels it is adequately controlled on he is tolerating aspirin well and states his blood pressure and sugars are better controlled.  Update 09/18/14 (LL): Mr. Mickel comes back for 2nd stroke office visit. He is having significantly more  difficulty since last visit with his speech, depression, headaches, and sleep. He states that he is not able to work and his disability has not yet been approved. He states his vision is increasingly blurred and he has trouble focusing. He is taking much better control of his diet and diabetes and has lost weight, almost 30 lbs. His most recent Hgb A1c is 7.1, down from 12.2. He is tolerating aspirin well with no signs of significant bleeding or bruising. He thinks his blood pressure is controlled, although it is elevated in the office today at 165/88.  He has not been able to see his psychiatrist due to owing a balance, but hopes that now that he has Medicaid that he will be able to work out a payment plan. He endorses suicidal thoughts at times and feelings of worthlessness now that he cannot work and function as he did before, but no plan to complete suicide.   REVIEW OF SYSTEMS: Full 14 system review of systems performed and notable only for: chills, blurred vision, eye pain, eye itching, light sensitivity, hearing loss, shortness of breath, insomnia, snoring, neck stiffness, joint pain, aching muscles, memory loss, headache, speech difficulty, facial drooping, agitation, depression, anxiety, suicidal thoughts, hyperactivity  ALLERGIES: No Known Allergies  HOME MEDICATIONS: Outpatient Prescriptions Prior to Visit  Medication Sig Dispense Refill  . aspirin 325 MG tablet Take 1 tablet (325 mg total) by mouth daily.  30 tablet  11  . atorvastatin (LIPITOR) 40 MG tablet Take 1 tablet (40 mg total) by mouth daily.  90 tablet  1  . FLUoxetine (PROZAC) 20 MG capsule Take 1 capsule (20 mg total) by mouth daily.  30 capsule  5  . hydrochlorothiazide (HYDRODIURIL) 12.5 MG tablet Take 1 tablet (12.5 mg total) by mouth daily.  90 tablet  1  . HYDROcodone-acetaminophen (NORCO/VICODIN) 5-325 MG per tablet Take 1 tablet by mouth every 6 (six) hours as needed for moderate pain.  45 tablet  0  . insulin aspart  protamine- aspart (NOVOLOG MIX 70/30) (70-30) 100 UNIT/ML injection Inject 0.3 mLs (30 Units total) into the skin 2 (two) times daily with a meal.  10 mL  5  . levETIRAcetam (KEPPRA) 500 MG tablet Take 1 tablet (500 mg total) by mouth every 12 (twelve) hours.  60 tablet  2  . pregabalin (LYRICA) 75 MG capsule Take 1 capsule (75 mg total) by mouth 2 (two) times daily.  60 capsule  2  . clonazePAM (KLONOPIN) 2 MG tablet Take 2 mg by mouth daily.      . valsartan (DIOVAN) 80 MG tablet Take 1 tablet (80 mg total) by mouth daily.  90 tablet  1   No facility-administered medications prior to visit.    PHYSICAL EXAM Filed Vitals:   09/18/14 0830  BP: 165/88  Pulse: 62  Height: 6' (1.829 m)  Weight: 266 lb (120.657 kg)   Body mass index is 36.07 kg/(m^2).  Physical Exam  General: well developed, well nourished, seated, AA male Head: head normocephalic and atraumatic. Orohparynx benign  Neck: supple with no carotid or supraclavicular bruits  Cardiovascular: regular rate and rhythm, no murmurs   Neurologic Exam  Mental Status: Awake and fully alert. Oriented to place and time. Recent and remote memory intact. Attention span, concentration and fund of knowledge reduced.  Cognitive slowing. Mood and affect are depressed. Tearful at times. Severe dysarthria, difficult to understand. Hesitant apraxic speech.  Cranial Nerves: Fundoscopic exam not done. Pupils equal, briskly reactive to light. Saccadic breakdown of smooth pursuit.. Visual fields showed mild partial left hormonymous hemianopsia to confrontation. Hearing is diminished on the left. Facial sensation decreased on the left. Face, tongue, palate movements are decreased on the left.  Motor: Left hemiparesis 3/5 with proximal weakness of left upper extremity left grip, intrinsic hand muscles and hip flexors. Has increased tone on the left side. Orbits right over left upper extremity.  Sensory: diminished left body touch and pinprick and  vibratory sensation.  Coordination: Rapid alternating movements are diminished in the left extremities. Finger-to-nose and heel-to-shin show apraxia and dysmetria on the left.  Gait and Station: Arises from chair with mild difficulty. Drags left leg. Gait demonstrates normal stride length and impaired balance. Unable to heel, toe and tandem walk without difficulty.  Reflexes: 1+ on right, 3+ on left.. Toes downgoing.  NIHSS 5  Modified Rankin 3  DIAGNOSTIC DATA (LABS, IMAGING, TESTING) - I reviewed patient records, labs, notes, testing and imaging myself where available.  Lab Results  Component Value Date   WBC 7.1 07/29/2014   HGB 14.0 07/29/2014   HCT 40.2 07/29/2014   MCV 88.0 07/29/2014   PLT 273 07/29/2014      Component Value Date/Time   NA 142 07/29/2014 0910   K 3.5 07/29/2014 0910   CL 102 07/29/2014 0910   CO2 30 07/29/2014 0910   GLUCOSE 49* 07/29/2014 0910   BUN 22 07/29/2014 0910   CREATININE 1.19 07/29/2014 0910   CREATININE 0.76 11/19/2013 0351   CALCIUM 9.7 07/29/2014 0910   PROT 7.8 07/29/2014 0910   ALBUMIN 4.7 07/29/2014 0910   AST 31 07/29/2014 0910   ALT 38 07/29/2014 0910  ALKPHOS 93 07/29/2014 0910   BILITOT 0.7 07/29/2014 0910   GFRNONAA >90 11/19/2013 0351   GFRNONAA 86 05/21/2013 1045   GFRAA >90 11/19/2013 0351   GFRAA >89 05/21/2013 1045   Lab Results  Component Value Date   CHOL 145 07/29/2014   HDL 37* 07/29/2014   LDLCALC 93 07/29/2014   TRIG 73 07/29/2014   CHOLHDL 3.9 07/29/2014   Lab Results  Component Value Date   HGBA1C 7.1 07/29/2014    ASSESSMENT: 71 year African American male with embolic right temporal and parietal middle cerebral artery branch infarct from unidentified source in December 2014 with vascular risk factors of hypertension, diabetes, and obesity.  History of symptomatic focal seizures during admission for stroke currently on Keppra. Residual deficits of severe dysarthria, speech apraxia, cognitive slowing and left sided weakness. He  will be permanently disabled from work.  PLAN:  I had a long discussion with the patient and wife regarding his stroke, discuss his strengths and abilities, secondary stroke prevention and answered questions. Continue aspirin for stroke prevention strict control of hypertension with blood pressure goal below 130/90, diabetes with with A1c equal to 6.5% and lipids with LDL cholesterol goal 70mg  percent.  Continue Levitiracetam for seizure prevention. Recommended that he continue to work on the exercises that were taught from Physical, Speech and Occupational Therapies and continue to seek treatment for his depression. Advised that if there is an immediate need for Psychiatric treatment , wife should call 911 or take him to [facility] ER.  Referral to Dr. Gevena Cotton, Neurophthalmology for vision evaluation post-stroke. Advised to ask for hearing test from his PCP, and referral if deficits are found. Return for followup in 6 months, or call earlier if necessary.   Most of my 45 minute visit today was spent in counseling and coordination of care, and reviewing test results.  Orders Placed This Encounter  Procedures  . Ambulatory referral to Ophthalmology   Rudi Rummage Jonell Krontz, MSN, FNP-BC, A/GNP-C 09/19/2014, 4:57 PM [facility] [address], [place], [place] [address] [phone]  Note: This document was prepared with digital dictation and possible smart phrase technology. Any transcriptional errors that result from this process are unintentional.

## 2014-09-19 ENCOUNTER — Telehealth: Payer: Self-pay | Admitting: Medical

## 2014-09-21 ENCOUNTER — Encounter: Payer: Self-pay | Admitting: Nurse Practitioner

## 2014-09-21 DIAGNOSIS — F339 Major depressive disorder, recurrent, unspecified: Secondary | ICD-10-CM | POA: Insufficient documentation

## 2014-09-22 NOTE — Telephone Encounter (Signed)
pls handle referral.   Have him come in for f/u.  Recent neurology visit notes concerns about depression and decreased mood as well as need for hearing eval.  Make him an appt.

## 2014-09-22 NOTE — Telephone Encounter (Signed)
I called and left a message on the voicemail at Dr. Sunday Corn office [phone] [address] [place], [place] [address] [facility]

## 2014-09-23 ENCOUNTER — Telehealth: Payer: Self-pay | Admitting: Medical

## 2014-09-23 ENCOUNTER — Other Ambulatory Visit: Payer: Self-pay | Admitting: Family Medicine

## 2014-09-23 ENCOUNTER — Other Ambulatory Visit: Payer: Self-pay | Admitting: Medical

## 2014-09-23 DIAGNOSIS — E119 Type 2 diabetes mellitus without complications: Secondary | ICD-10-CM

## 2014-09-23 DIAGNOSIS — Z0100 Encounter for examination of eyes and vision without abnormal findings: Principal | ICD-10-CM

## 2014-09-23 MED ORDER — HYDROCODONE-ACETAMINOPHEN 5-325 MG PO TABS: 1.0000 | ORAL_TABLET | Freq: Four times a day (QID) | ORAL | Status: DC | PRN

## 2014-09-23 NOTE — Telephone Encounter (Signed)
Medication ready.

## 2014-09-23 NOTE — Telephone Encounter (Signed)
Pt needs refill on hydrocodone. Call [phone] when ready.

## 2014-09-23 NOTE — Telephone Encounter (Signed)
I left another message at Dr. Sunday Corn office about setting up an appointment

## 2014-09-23 NOTE — Telephone Encounter (Signed)
Advised pt of same. 

## 2014-09-23 NOTE — Telephone Encounter (Signed)
Patient has an appointment here on 09/24/14 @ 945 am Patient has an appointment at [facility] on 10/21/14 @ 215pm

## 2014-09-24 ENCOUNTER — Encounter: Payer: Self-pay | Admitting: Medical

## 2014-09-24 ENCOUNTER — Ambulatory Visit (INDEPENDENT_AMBULATORY_CARE_PROVIDER_SITE_OTHER): Payer: Medicaid Other | Admitting: Medical

## 2014-09-24 ENCOUNTER — Telehealth: Payer: Self-pay | Admitting: Medical

## 2014-09-24 VITALS — BP 130/88 | HR 60 | Temp 98.0°F | Resp 16 | Wt 252.0 lb

## 2014-09-24 DIAGNOSIS — H9193 Unspecified hearing loss, bilateral: Secondary | ICD-10-CM

## 2014-09-24 DIAGNOSIS — H6123 Impacted cerumen, bilateral: Secondary | ICD-10-CM

## 2014-09-24 DIAGNOSIS — F32A Depression, unspecified: Secondary | ICD-10-CM

## 2014-09-24 DIAGNOSIS — F329 Major depressive disorder, single episode, unspecified: Secondary | ICD-10-CM

## 2014-09-24 MED ORDER — FLUOXETINE HCL 40 MG PO CAPS: 40.0000 mg | ORAL_CAPSULE | Freq: Every day | ORAL | Status: DC

## 2014-09-24 NOTE — Progress Notes (Signed)
I agree with the above plan 

## 2014-09-24 NOTE — Telephone Encounter (Signed)
1) refer or get info to wife and him regarding [facility] here locally 2) call [facility] to see if they have contact info for a stroke support group which he is really interrested in  Let me know what you come up with

## 2014-09-24 NOTE — Progress Notes (Signed)
Subjective: Here at my request as f/u from neurology visit where there was request for [place] to perform hearing screen and to address his depression and suicidal thoughts.   Since the stroke, very frustrated, problems with word processing in his mind, speech changes, limitations in cognition.  Has feelings of worthlessness.   Has children here locally.  Wife is his caregiver and biggest support.  He would liek to be involved in a support group.  No other new issues.  Objective: Filed Vitals:   09/24/14 0958  BP: 130/88  Pulse: 60  Temp: 98 F (36.7 C)  Resp: 16    General appearance: alert, no distress, WD/WN Psych: stuttering at times, slowed speech, takes some time to get a word out or gets frustrated coming up with words HEENT: normocephalic, sclerae anicteric, bilat canals with impacted cerumen, nares patent, no discharge or erythema, pharynx normal Oral cavity: MMM, no lesions Neck: supple, no lymphadenopathy, no thyromegaly, no masses    Assessment: Encounter Diagnoses  Name Primary?  . Depression Yes  . Decreased hearing of both ears   . Impacted cerumen of both ears    Plan: Depression - increase to Prozac 40mg  daily.  Will check into support group, counseling, adult enrichment center  Decreased hearing - after removing, no change in hearing screen.  In 2 wk if not really seeing any change in hearing, referral to audiology/ENT.  Discussed findings.  Discussed risk/benefits of procedure and patient agrees to procedure. Successfully used warm water lavage to remove impacted cerumen from bilat ear canal. Patient tolerated procedure well. Advised they avoid using any cotton swabs or other devices to clean the ear canals.  Use basic hygiene as discussed.  Follow up prn.

## 2014-09-25 NOTE — Telephone Encounter (Signed)
Left message at [facility] to call back with info regarding stroke support group.

## 2014-09-25 NOTE — Telephone Encounter (Signed)
LM to [place] for [place], and I have contacted [facility], hoping they will call back with support group information.

## 2014-09-29 ENCOUNTER — Telehealth: Payer: Self-pay | Admitting: Nurse Practitioner

## 2014-09-29 NOTE — Telephone Encounter (Signed)
Arnell Asal from the [place] will come by the office tomorrow to bring me some information to mail to the patient. [phone] [place],  [phone]

## 2014-09-29 NOTE — Telephone Encounter (Signed)
I left a message with the nurse inquiring/ requesting information about a stroke support group information from there office. They said they would give me a call back with in 24 hours. [phone]

## 2014-09-29 NOTE — Telephone Encounter (Signed)
Andria Frames with [facility] @ [phone], calling requesting information for Stroke Support group.  Please call and advise.

## 2014-09-29 NOTE — Telephone Encounter (Signed)
I also called and left another message at the [place] [phone] for there office to call me back.

## 2014-09-30 NOTE — Telephone Encounter (Signed)
I mailed Brandon [name] a packet of information about the adult day progam.

## 2014-10-03 NOTE — Telephone Encounter (Signed)
Called and left Vm message with Chandra,more clarification on requested information for stroke support group.

## 2014-10-04 ENCOUNTER — Telehealth: Payer: Self-pay | Admitting: Internal Medicine

## 2014-10-04 NOTE — Telephone Encounter (Signed)
Faxed over medical records to [facility] @ [phone] on 09/09/14

## 2014-10-13 NOTE — Telephone Encounter (Signed)
Brandon [name] said that physician at [facility] would like to know if there is a stroke support group or any  information to give to patient about stroke support

## 2014-10-13 NOTE — Telephone Encounter (Signed)
Stroke Warriors: Achieving Wellness After Stroke Date/Time: Second Thursday of every month, except December. Location: [place] or [place] at [phone] for more information.  Stroke [place]  Date/time: Third Sunday of each month, 3 p.m. (except June, July and August) Location: The [place]. [facility], Inpatient Rehabilitation Dayroom For more information, contact Gillian Shields at [phone]  [place] support and education to stroke survivors and their families. When: The fourth Tuesday of each month from noon until 1:30 p.m., March through December. Info: Reservations are appreciated but not required. For reservations or more information, call [phone].

## 2014-10-14 NOTE — Telephone Encounter (Signed)
Called Andria Frames and left VM that I will fax over the stroke support information  Per Ms Lam's note

## 2014-10-21 ENCOUNTER — Encounter: Payer: Self-pay | Admitting: Neurology

## 2014-10-27 ENCOUNTER — Other Ambulatory Visit: Payer: Self-pay | Admitting: Medical

## 2014-10-27 ENCOUNTER — Telehealth: Payer: Self-pay | Admitting: Medical

## 2014-10-27 MED ORDER — HYDROCODONE-ACETAMINOPHEN 5-325 MG PO TABS: 1.0000 | ORAL_TABLET | Freq: Four times a day (QID) | ORAL | Status: DC | PRN

## 2014-10-27 NOTE — Telephone Encounter (Signed)
Pt informed

## 2014-10-27 NOTE — Telephone Encounter (Signed)
Pt needs a refill on hydrocodone. Call when ready @ [phone] and he will get someone to bring him or pick it up. Needs it by tomorrow

## 2014-10-27 NOTE — Telephone Encounter (Signed)
rx ready 

## 2014-12-12 ENCOUNTER — Institutional Professional Consult (permissible substitution): Payer: Medicaid Other | Admitting: Medical

## 2014-12-13 ENCOUNTER — Emergency Department (HOSPITAL_COMMUNITY): Payer: Medicaid Other

## 2014-12-13 ENCOUNTER — Emergency Department (HOSPITAL_COMMUNITY)
Admission: EM | Admit: 2014-12-13 | Discharge: 2014-12-15 | Disposition: A | Discharge status: Other Acute Inpatient Hospital | Payer: Medicaid Other | Attending: Emergency Medicine | Admitting: Emergency Medicine

## 2014-12-13 ENCOUNTER — Encounter (HOSPITAL_COMMUNITY): Payer: Self-pay | Admitting: *Deleted

## 2014-12-13 DIAGNOSIS — G40909 Epilepsy, unspecified, not intractable, without status epilepticus: Secondary | ICD-10-CM | POA: Insufficient documentation

## 2014-12-13 DIAGNOSIS — R0789 Other chest pain: Secondary | ICD-10-CM | POA: Insufficient documentation

## 2014-12-13 DIAGNOSIS — R531 Weakness: Secondary | ICD-10-CM | POA: Diagnosis not present

## 2014-12-13 DIAGNOSIS — E119 Type 2 diabetes mellitus without complications: Secondary | ICD-10-CM | POA: Insufficient documentation

## 2014-12-13 DIAGNOSIS — R52 Pain, unspecified: Secondary | ICD-10-CM

## 2014-12-13 DIAGNOSIS — R110 Nausea: Secondary | ICD-10-CM | POA: Insufficient documentation

## 2014-12-13 DIAGNOSIS — M545 Low back pain: Secondary | ICD-10-CM | POA: Diagnosis present

## 2014-12-13 DIAGNOSIS — Z7982 Long term (current) use of aspirin: Secondary | ICD-10-CM | POA: Insufficient documentation

## 2014-12-13 DIAGNOSIS — Z794 Long term (current) use of insulin: Secondary | ICD-10-CM | POA: Diagnosis not present

## 2014-12-13 DIAGNOSIS — Z8673 Personal history of transient ischemic attack (TIA), and cerebral infarction without residual deficits: Secondary | ICD-10-CM | POA: Insufficient documentation

## 2014-12-13 DIAGNOSIS — R5383 Other fatigue: Secondary | ICD-10-CM | POA: Insufficient documentation

## 2014-12-13 DIAGNOSIS — R4182 Altered mental status, unspecified: Secondary | ICD-10-CM | POA: Insufficient documentation

## 2014-12-13 DIAGNOSIS — Z8719 Personal history of other diseases of the digestive system: Secondary | ICD-10-CM | POA: Diagnosis not present

## 2014-12-13 DIAGNOSIS — Z87448 Personal history of other diseases of urinary system: Secondary | ICD-10-CM | POA: Diagnosis not present

## 2014-12-13 DIAGNOSIS — I10 Essential (primary) hypertension: Secondary | ICD-10-CM | POA: Insufficient documentation

## 2014-12-13 DIAGNOSIS — Z79899 Other long term (current) drug therapy: Secondary | ICD-10-CM | POA: Insufficient documentation

## 2014-12-13 DIAGNOSIS — D3000 Benign neoplasm of unspecified kidney: Secondary | ICD-10-CM | POA: Diagnosis not present

## 2014-12-13 DIAGNOSIS — R471 Dysarthria and anarthria: Secondary | ICD-10-CM | POA: Insufficient documentation

## 2014-12-13 DIAGNOSIS — R32 Unspecified urinary incontinence: Secondary | ICD-10-CM

## 2014-12-13 DIAGNOSIS — R109 Unspecified abdominal pain: Secondary | ICD-10-CM | POA: Insufficient documentation

## 2014-12-13 DIAGNOSIS — W19XXXA Unspecified fall, initial encounter: Secondary | ICD-10-CM

## 2014-12-13 DIAGNOSIS — R0781 Pleurodynia: Secondary | ICD-10-CM

## 2014-12-13 LAB — CBC WITH DIFFERENTIAL/PLATELET
Basophils Absolute: 0 10*3/uL (ref 0.0–0.1)
Basophils Relative: 1 % (ref 0–1)
EOS PCT: 1 % (ref 0–5)
Eosinophils Absolute: 0.1 10*3/uL (ref 0.0–0.7)
HEMATOCRIT: 33.2 % — AB (ref 39.0–52.0)
Hemoglobin: 11.4 g/dL — ABNORMAL LOW (ref 13.0–17.0)
Lymphocytes Relative: 37 % (ref 12–46)
Lymphs Abs: 2.1 10*3/uL (ref 0.7–4.0)
MCH: 29.8 pg (ref 26.0–34.0)
MCHC: 34.3 g/dL (ref 30.0–36.0)
MCV: 86.9 fL (ref 78.0–100.0)
Monocytes Absolute: 0.5 10*3/uL (ref 0.1–1.0)
Monocytes Relative: 9 % (ref 3–12)
Neutro Abs: 3 10*3/uL (ref 1.7–7.7)
Neutrophils Relative %: 52 % (ref 43–77)
Platelets: 234 10*3/uL (ref 150–400)
RBC: 3.82 MIL/uL — AB (ref 4.22–5.81)
RDW: 11.9 % (ref 11.5–15.5)
WBC: 5.6 10*3/uL (ref 4.0–10.5)

## 2014-12-13 LAB — COMPREHENSIVE METABOLIC PANEL
ALBUMIN: 3.9 g/dL (ref 3.5–5.2)
ALK PHOS: 92 U/L (ref 39–117)
ALT: 26 U/L (ref 0–53)
AST: 43 U/L — ABNORMAL HIGH (ref 0–37)
Anion gap: 6 (ref 5–15)
BUN: 13 mg/dL (ref 6–23)
CALCIUM: 9.2 mg/dL (ref 8.4–10.5)
CO2: 25 mmol/L (ref 19–32)
Chloride: 106 mEq/L (ref 96–112)
Creatinine, Ser: 0.95 mg/dL (ref 0.50–1.35)
GFR calc Af Amer: >90 mL/min (ref >90)
GFR calc non Af Amer: >90 mL/min (ref >90)
Glucose, Bld: 78 mg/dL (ref 70–99)
POTASSIUM: 5 mmol/L (ref 3.5–5.1)
SODIUM: 137 mmol/L (ref 135–145)
Total Bilirubin: 1.4 mg/dL — ABNORMAL HIGH (ref 0.3–1.2)
Total Protein: 6.6 g/dL (ref 6.0–8.3)

## 2014-12-13 LAB — URINALYSIS, ROUTINE W REFLEX MICROSCOPIC
BILIRUBIN URINE: NEGATIVE
GLUCOSE, UA: 250 mg/dL — AB
Hgb urine dipstick: NEGATIVE
KETONES UR: NEGATIVE mg/dL
Leukocytes, UA: NEGATIVE
NITRITE: NEGATIVE
PH: 5.5 (ref 5.0–8.0)
Protein, ur: NEGATIVE mg/dL
Specific Gravity, Urine: 1.024 (ref 1.005–1.030)
Urobilinogen, UA: 1 mg/dL (ref 0.0–1.0)

## 2014-12-13 LAB — POC OCCULT BLOOD, ED: FECAL OCCULT BLD: NEGATIVE

## 2014-12-13 LAB — LIPASE, BLOOD: Lipase: 30 U/L (ref 11–59)

## 2014-12-13 LAB — D-DIMER, QUANTITATIVE (NOT AT ARMC): D DIMER QUANT: 1.07 ug{FEU}/mL — AB (ref 0.00–0.48)

## 2014-12-13 LAB — CBG MONITORING, ED
GLUCOSE-CAPILLARY: 72 mg/dL (ref 70–99)
GLUCOSE-CAPILLARY: 210 mg/dL — AB (ref 70–99)

## 2014-12-13 LAB — TROPONIN I

## 2014-12-13 MED ORDER — ASPIRIN 325 MG PO TABS
325.0000 mg | ORAL_TABLET | Freq: Every day | ORAL | Status: DC
  Administered 2014-12-14: 325 mg via ORAL
  Filled 2014-12-13 (×2): qty 1

## 2014-12-13 MED ORDER — MORPHINE SULFATE 4 MG/ML IJ SOLN
4.0000 mg | Freq: Once | INTRAMUSCULAR | Status: AC
  Administered 2014-12-13: 4 mg via INTRAVENOUS
  Filled 2014-12-13: qty 1

## 2014-12-13 MED ORDER — PREGABALIN 75 MG PO CAPS
75.0000 mg | ORAL_CAPSULE | Freq: Two times a day (BID) | ORAL | Status: DC
  Administered 2014-12-13 – 2014-12-14 (×4): 75 mg via ORAL
  Filled 2014-12-13 (×4): qty 1

## 2014-12-13 MED ORDER — LEVETIRACETAM 500 MG PO TABS
500.0000 mg | ORAL_TABLET | Freq: Two times a day (BID) | ORAL | Status: DC
  Administered 2014-12-13 – 2014-12-14 (×3): 500 mg via ORAL
  Filled 2014-12-13 (×5): qty 1

## 2014-12-13 MED ORDER — FLUOXETINE HCL 20 MG PO CAPS
40.0000 mg | ORAL_CAPSULE | Freq: Every day | ORAL | Status: DC
  Administered 2014-12-13 – 2014-12-14 (×2): 40 mg via ORAL
  Filled 2014-12-13 (×3): qty 2

## 2014-12-13 MED ORDER — GADOBENATE DIMEGLUMINE 529 MG/ML IV SOLN
20.0000 mL | Freq: Once | INTRAVENOUS | Status: AC | PRN
  Administered 2014-12-13: 20 mL via INTRAVENOUS

## 2014-12-13 MED ORDER — HYDROCODONE-ACETAMINOPHEN 5-325 MG PO TABS: 2.0000 | ORAL_TABLET | ORAL | Status: DC | PRN

## 2014-12-13 MED ORDER — SODIUM CHLORIDE 0.9 % IV BOLUS (SEPSIS)
1000.0000 mL | Freq: Once | INTRAVENOUS | Status: AC
  Administered 2014-12-13: 1000 mL via INTRAVENOUS

## 2014-12-13 MED ORDER — HYDROCHLOROTHIAZIDE 25 MG PO TABS
12.5000 mg | ORAL_TABLET | Freq: Every day | ORAL | Status: DC
  Administered 2014-12-14: 12.5 mg via ORAL
  Filled 2014-12-13 (×2): qty 1

## 2014-12-13 MED ORDER — INSULIN ASPART 100 UNIT/ML ~~LOC~~ SOLN
0.0000 [IU] | Freq: Three times a day (TID) | SUBCUTANEOUS | Status: DC
Start: 2014-12-13 — End: 2014-12-15
  Administered 2014-12-14: 1 [IU] via SUBCUTANEOUS
  Administered 2014-12-14: 3 [IU] via SUBCUTANEOUS
  Filled 2014-12-13 (×2): qty 1

## 2014-12-13 MED ORDER — IOHEXOL 350 MG/ML SOLN
100.0000 mL | Freq: Once | INTRAVENOUS | Status: AC | PRN
  Administered 2014-12-13: 100 mL via INTRAVENOUS

## 2014-12-13 MED ORDER — HYDROMORPHONE HCL 1 MG/ML IJ SOLN
1.0000 mg | Freq: Once | INTRAMUSCULAR | Status: AC
  Administered 2014-12-13: 1 mg via INTRAVENOUS
  Filled 2014-12-13: qty 1

## 2014-12-13 MED ORDER — HYDROCODONE-ACETAMINOPHEN 5-325 MG PO TABS
1.0000 | ORAL_TABLET | Freq: Four times a day (QID) | ORAL | Status: DC | PRN
  Administered 2014-12-14 (×2): 1 via ORAL
  Filled 2014-12-13 (×2): qty 1

## 2014-12-13 MED ORDER — INSULIN ASPART PROT & ASPART (70-30 MIX) 100 UNIT/ML ~~LOC~~ SUSP
30.0000 [IU] | Freq: Two times a day (BID) | SUBCUTANEOUS | Status: DC
  Administered 2014-12-14 (×2): 30 [IU] via SUBCUTANEOUS
  Filled 2014-12-13: qty 10

## 2014-12-13 MED ORDER — ONDANSETRON HCL 4 MG/2ML IJ SOLN
4.0000 mg | Freq: Once | INTRAMUSCULAR | Status: AC
  Administered 2014-12-13: 4 mg via INTRAVENOUS
  Filled 2014-12-13: qty 2

## 2014-12-13 MED ORDER — ATORVASTATIN CALCIUM 40 MG PO TABS
40.0000 mg | ORAL_TABLET | Freq: Every day | ORAL | Status: DC
  Administered 2014-12-13 – 2014-12-14 (×2): 40 mg via ORAL
  Filled 2014-12-13 (×3): qty 1

## 2014-12-13 MED ORDER — ONDANSETRON HCL 4 MG PO TABS: 4.0000 mg | ORAL_TABLET | Freq: Three times a day (TID) | ORAL | Status: DC | PRN

## 2014-12-13 MED ORDER — IOHEXOL 300 MG/ML  SOLN: 25.0000 mL | Freq: Once | INTRAMUSCULAR | Status: AC | PRN

## 2014-12-13 NOTE — ED Notes (Addendum)
Brandon [name], staffing office aware of need for sitter.

## 2014-12-13 NOTE — ED Notes (Signed)
Patient still doing TTS assessment.

## 2014-12-13 NOTE — ED Notes (Signed)
Pt reports having hx of renal cancer and had surgery two years ago. Has nerve pain since the surgery but it has gotten worse recently. Pain is to bilateral ribs and back, radiates around to abd. Reports nausea and recently incontinent of bowel.

## 2014-12-13 NOTE — ED Notes (Signed)
[name] AND PT'S Brandon [name], Brandon [name] - [phone] Brandon [name] ISSUES AND IF D/C'D TO HOME PLEASE.

## 2014-12-13 NOTE — ED Notes (Signed)
Jane, Staffing, advised will be able to provide sitter at 1900 and will try to provide one prior. Evern Core, Charge RN, aware.

## 2014-12-13 NOTE — Discharge Instructions (Signed)
Back Pain, Adult Follow up with your doctor and Dr. Arnoldo Morale from neurosurgery. Return to the ED if you develop new or worsening symptoms. Low back pain is very common. About 1 in 5 people have back pain.The cause of low back pain is rarely dangerous. The pain often gets better over time.About half of people with a sudden onset of back pain feel better in just 2 weeks. About 8 in 10 people feel better by 6 weeks.  CAUSES Some common causes of back pain include:  Strain of the muscles or ligaments supporting the spine.  Wear and tear (degeneration) of the spinal discs.  Arthritis.  Direct injury to the back. DIAGNOSIS Most of the time, the direct cause of low back pain is not known.However, back pain can be treated effectively even when the exact cause of the pain is unknown.Answering your caregiver's questions about your overall health and symptoms is one of the most accurate ways to make sure the cause of your pain is not dangerous. If your caregiver needs more information, he or she may order lab work or imaging tests (X-rays or MRIs).However, even if imaging tests show changes in your back, this usually does not require surgery. HOME CARE INSTRUCTIONS For many people, back pain returns.Since low back pain is rarely dangerous, it is often a condition that people can learn to [facility] their own.   Remain active. It is stressful on the back to sit or stand in one place. Do not sit, drive, or stand in one place for more than 30 minutes at a time. Take short walks on level surfaces as soon as pain allows.Try to increase the length of time you walk each day.  Do not stay in bed.Resting more than 1 or 2 days can delay your recovery.  Do not avoid exercise or work.Your body is made to move.It is not dangerous to be active, even though your back may hurt.Your back will likely heal faster if you return to being active before your pain is gone.  Pay attention to your body when you bend  and lift. Many people have less discomfortwhen lifting if they bend their knees, keep the load close to their bodies,and avoid twisting. Often, the most comfortable positions are those that put less stress on your recovering back.  Find a comfortable position to sleep. Use a firm mattress and lie on your side with your knees slightly bent. If you lie on your back, put a pillow under your knees.  Only take over-the-counter or prescription medicines as directed by your caregiver. Over-the-counter medicines to reduce pain and inflammation are often the most helpful.Your caregiver may prescribe muscle relaxant drugs.These medicines help dull your pain so you can more quickly return to your normal activities and healthy exercise.  Put ice on the injured area.  Put ice in a plastic bag.  Place a towel between your skin and the bag.  Leave the ice on for 15-20 minutes, 03-04 times a day for the first 2 to 3 days. After that, ice and heat may be alternated to reduce pain and spasms.  Ask your caregiver about trying back exercises and gentle massage. This may be of some benefit.  Avoid feeling anxious or stressed.Stress increases muscle tension and can worsen back pain.It is important to recognize when you are anxious or stressed and learn ways to manage it.Exercise is a great option. SEEK MEDICAL CARE IF:  You have pain that is not relieved with rest or medicine.  You have  pain that does not improve in 1 week.  You have new symptoms.  You are generally not feeling well. SEEK IMMEDIATE MEDICAL CARE IF:   You have pain that radiates from your back into your legs.  You develop new bowel or bladder control problems.  You have unusual weakness or numbness in your arms or legs.  You develop nausea or vomiting.  You develop abdominal pain.  You feel faint. Document Released: 11/21/2005 Document Revised: 05/22/2012 Document Reviewed: 03/25/2014 [facility] Patient Information 2015  [place], [place]. This information is not intended to replace advice given to you by your health care provider. Make sure you discuss any questions you have with your health care provider.

## 2014-12-13 NOTE — ED Notes (Signed)
Finished with TTS assessment; changing into wine scrubs now; security called for wanding.

## 2014-12-13 NOTE — ED Notes (Signed)
Patient in MRI at this time. 

## 2014-12-13 NOTE — ED Notes (Signed)
Patient transported to CT 

## 2014-12-13 NOTE — ED Notes (Signed)
Ordered pt diet tray - CHO mod

## 2014-12-13 NOTE — ED Notes (Signed)
Patient returned from MRI.

## 2014-12-13 NOTE — ED Notes (Signed)
Patient transported to X-ray 

## 2014-12-13 NOTE — ED Notes (Signed)
Still doing the TTS assessment.

## 2014-12-13 NOTE — ED Notes (Signed)
Patient transported to x-ray at this time.   

## 2014-12-13 NOTE — BH Assessment (Addendum)
Tele Assessment Note   Brandon [name] is an 54 y.o. male who came in due to chronic pain issues related to surgeries to remove cancer from his kidneys. He also had a stroke December 2014 which has created left side weakness. He states that he is currently feelings depressed but does not endorse SI at the moment. He states that he has had SI thoughts in the past and sometimes feels like he "should have just died" when he had cancer but does not present with a plan. He states that he wants to get better and needs help. Pt presented with pressured and rapid speech with flights of ideas. His fiance was contacted for collateral information and she states that he has been exhibiting bizarre behavior in the past week and she is concerned. She states that she found him last night after midnight yelling in the parking lot saying "I'm tired of these people investigating me" he had a pair of clippers in his hand (sharp metal toenail clippers that look like scissors). The fiance stated that if someone was around he might have tried to hurt them. She states he seemed confused at the time but has been saying that there are people "watching him" specifically the police and fed and they "don't like him" for the past week. She states that he whispers in the house "shhhh they're hear you". He has never acted like this before but she does state that he has a history of highs and lows where he crashes after being very hyperactive and states this has been going on since she's known him.  After evaluation it is recommended pt be placed inpatient by Catalina Pizza, NP   Axis I: 296.54 Bipolar 1 disorder, current episode depressed with psychotic features,  294.8 Other specified mental disorder dut to another medical condition Axis II: Deferred Axis III:  Past Medical History  Diagnosis Date  . Hypertension   . Diverticulitis     s/p micorperforation Sept 2012-managed conservatively by Gen surgery  . Kidney tumor 09/2011     Renal cell CA  . Wears glasses   . ED (erectile dysfunction)   . 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 Acute ischemic stroke 11/2013  . Focal seizure 11/2013    due to ischemic stroke  . Diabetes mellitus     DKA prior hospitalization   Axis IV: economic problems, problems related to social environment, problems with access to health care services and problems with primary support group Axis V: 21-30 behavior considerably influenced by delusions or hallucinations OR serious impairment in judgment, communication OR inability to function in almost all areas  Past Medical History:  Past Medical History  Diagnosis Date  . Hypertension   . Diverticulitis     s/p micorperforation Sept 2012-managed conservatively by Gen surgery  . Kidney tumor 09/2011    Renal cell CA  . Wears glasses   . ED (erectile dysfunction)   . 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 Acute ischemic stroke 11/2013  . Focal seizure 11/2013    due to ischemic stroke  . Diabetes mellitus     DKA prior hospitalization    Past Surgical History  Procedure Laterality Date  . Kidney surgery      ablation of renal cell CA - 12/28, prior one was October 2012-Dr. Kathlene Cote  . Tee without cardioversion N/A 11/19/2013  Procedure: TRANSESOPHAGEAL ECHOCARDIOGRAM (TEE);  Surgeon: Lelon Perla, MD;  [facility] ENDOSCOPY;  Service: Cardiovascular;  Laterality: N/A;    Family History:  Family History  Problem Relation Age of Onset  . Diabetes Father     Social History:  reports that he has never smoked. He has never used smokeless tobacco. He reports that he does not drink alcohol or use illicit drugs.  Additional Social History:  Alcohol / Drug Use History of alcohol / drug use?: Yes Substance #1 Name of Substance 1: Occasional Alcohol use denies drug use   CIWA: CIWA-Ar BP: 133/75 mmHg Pulse Rate: 60 COWS:    PATIENT STRENGTHS:  (choose at least two) Communication skills Motivation for treatment/growth  Allergies: No Known Allergies  Home Medications:  (Not in a hospital admission)  OB/GYN Status:  No LMP for male patient.  General Assessment Data Location of Assessment: [facility] ED ACT Assessment: Yes Is this a Tele or Face-to-Face Assessment?: Tele Assessment Is this an Initial Assessment or a Re-assessment for this encounter?: Initial Assessment Living Arrangements: Spouse/significant other Can pt return to current living arrangement?: Yes Admission Status: Voluntary Is patient capable of signing voluntary admission?: Yes Transfer from: Home Referral Source: Self/Family/Friend     [place] Living Arrangements: Spouse/significant other Name of Psychiatrist:  (None) Name of Therapist: None  Education Status Is patient currently in school?: No  Risk to self with the past 6 months Suicidal Ideation: No (No SI today, but has had recently) Suicidal Intent: No Is patient at risk for suicide?: No Suicidal Plan?: No Access to Means: No What has been your use of drugs/alcohol within the last 12 months?:  (Denies drug and alcohol use) Previous Attempts/Gestures: No How many times?: 0 Other Self Harm Risks: Chronic pain  Triggers for Past Attempts: None known Intentional Self Injurious Behavior: None Family Suicide History: Unknown Recent stressful life event(s):  (chronic pain, hx of cancer, erectile disfunction) Persecutory voices/beliefs?: No Depression: Yes Depression Symptoms: Despondent, Loss of interest in usual pleasures, Feeling worthless/self pity, Feeling angry/irritable Substance abuse history and/or treatment for substance abuse?: No Suicide prevention information given to non-admitted patients: Not applicable  Risk to Others within the past 6 months Homicidal Ideation: No Thoughts of Harm to Others: No Current Homicidal Intent: No Current Homicidal Plan: No Access to  Homicidal Means: No Identified Victim:  (no) History of harm to others?: No Assessment of Violence: None Noted Violent Behavior Description:  (No) Does patient have access to weapons?: No Criminal Charges Pending?: No Does patient have a court date: No  Psychosis Hallucinations: None noted Delusions: Persecutory (thinks the feds and police are "out to get him")  Mental Status Report Appear/Hygiene: Bizarre, Disheveled Eye Contact: Fair Motor Activity: Restlessness Speech: Rapid, Pressured Level of Consciousness: Alert, Restless Mood: Labile Affect: Depressed Anxiety Level: Moderate Thought Processes: Tangential, Flight of Ideas Judgement: Partial Orientation: Person, Place, Time Obsessive Compulsive Thoughts/Behaviors: Moderate  Cognitive Functioning Concentration: Decreased Memory: Recent Impaired, Remote Impaired IQ: Average Insight: Poor Impulse Control: Poor Appetite: Poor Weight Loss:  (40lbs) Weight Gain:  (0) Sleep: Decreased Total Hours of Sleep:  (4) Vegetative Symptoms: Staying in bed  ADLScreening [facility] Assessment Services) Patient's cognitive ability adequate to safely complete daily activities?: Yes Patient able to express need for assistance with ADLs?: Yes Independently performs ADLs?: Yes (appropriate for developmental age)  Prior Inpatient Therapy Prior Inpatient Therapy: No  Prior Outpatient Therapy Prior Outpatient Therapy: No  ADL Screening (condition at time of admission) Patient's  cognitive ability adequate to safely complete daily activities?: Yes Is the patient deaf or have difficulty hearing?: No Does the patient have difficulty seeing, even when wearing glasses/contacts?: No Does the patient have difficulty concentrating, remembering, or making decisions?: Yes Patient able to express need for assistance with ADLs?: Yes Does the patient have difficulty dressing or bathing?: Yes Independently performs ADLs?: Yes (appropriate for  developmental age) Communication: Independent Dressing (OT): Independent Grooming: Independent Feeding: Independent Bathing: Independent Toileting: Independent In/Out Bed: Independent Walks in Home: Independent Does the patient have difficulty walking or climbing stairs?: Yes Weakness of Legs: Left Weakness of Arms/Hands: Left  Home Assistive Devices/Equipment Home Assistive Devices/Equipment: None (has chronic pain difficulty getting around but denies using device)    Abuse/Neglect Assessment (Assessment to be complete while patient is alone) Physical Abuse: Denies Verbal Abuse: Denies Sexual Abuse: Denies Exploitation of patient/patient's resources: Denies Self-Neglect: Denies Values / Beliefs Cultural Requests During Hospitalization: None Spiritual Requests During Hospitalization: None Consults Spiritual Care Consult Needed: No Social Work Consult Needed: No Regulatory affairs officer (For Healthcare) Does patient have an advance directive?: No Would patient like information on creating an advanced directive?: No - patient declined information    Additional Information 1:1 In Past 12 Months?: No CIRT Risk: No Elopement Risk: No Does patient have medical clearance?: Yes     Disposition:  Disposition Initial Assessment Completed for this Encounter: Yes Disposition of Patient: Inpatient treatment program Type of inpatient treatment program: Adult  Monserrate Blaschke 12/13/2014 5:41 PM

## 2014-12-13 NOTE — ED Notes (Signed)
Patient returned from X-ray 

## 2014-12-13 NOTE — ED Notes (Signed)
Pt resting with eyes closed. No needs expressed at this time

## 2014-12-13 NOTE — ED Notes (Signed)
PER KRISTEN, [facility], AFTER SHE SPOKE W/PT'S SPOUSE, IS NOW RECOMMENDING FOR PT STAY - ADVISED SHE WILL CONTACT EDP. DR Wyvonnia Dusky THEN ADVISED RN PT IS NOT GOING HOME AT THIS TIME.

## 2014-12-13 NOTE — ED Notes (Signed)
Remains in radiology.

## 2014-12-13 NOTE — BH Assessment (Addendum)
Brandon [name], [facility], confirms adult unit is currently at capacity. Contacted the following facilities for placement:  BED AVAILABLE, FAXED CLINICAL INFORMATION: [facility], per [facility], per [facility], per [employer]  AT CAPACITY: [facility], per [facility], per [facility], per [facility], per [facility], per [facility], per [facility], per [facility], per [facility], per [facility], per [facility] per [facility], per [facility], per [facility], per [facility]  PT DECLINED: [place], [place], [facility] Triage Specialist [phone]

## 2014-12-13 NOTE — ED Provider Notes (Signed)
CSN: [id number]     Arrival date & time 12/13/14  [id number] History   First MD Initiated Contact with Patient 12/13/14 0932     Chief Complaint  Patient presents with  . Pain     (Consider location/radiation/quality/duration/timing/severity/associated sxs/prior Treatment) HPI Comments: Patient complains of pain across his back, bilateral ribs, abdomen and worsening over the past several days. He has a history of renal cell carcinoma status post resection 2 years ago and states this pain is similar to "nerve pain" that he's had after the surgery. He had seen pain management doctors initially but no longer sees him since he had a stroke. He reports this feels like "nerve pain" because he had nerve damage from the surgery. The pain is across his mid to low back radiating around his abdomen and rib cage. He denies any chest pain, shortness of breath, cough or fever. Denies any pain radiating down his legs. States the past 3 days he is woken up and having incontinence of bladder and bowel in his sleep. He has no trouble with incontinence during the day. No previous history of back problems. He is not taking any pain medications at home. Denies any difficult to walking or falls.  The history is provided by a relative.    Past Medical History  Diagnosis Date  . Hypertension   . Diverticulitis     s/p micorperforation Sept 2012-managed conservatively by Gen surgery  . Kidney tumor 09/2011    Renal cell CA  . Wears glasses   . ED (erectile dysfunction)   . 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 Acute ischemic stroke 11/2013  . Focal seizure 11/2013    due to ischemic stroke  . Diabetes mellitus     DKA prior hospitalization   Past Surgical History  Procedure Laterality Date  . Kidney surgery      ablation of renal cell CA - 12/28, prior one was October 2012-Dr. Kathlene Cote  . Tee without cardioversion N/A 11/19/2013    Procedure: TRANSESOPHAGEAL ECHOCARDIOGRAM  (TEE);  Surgeon: Lelon Perla, MD;  Location: [facility] ENDOSCOPY;  Service: Cardiovascular;  Laterality: N/A;   Family History  Problem Relation Age of Onset  . Diabetes Father    History  Substance Use Topics  . Smoking status: Never Smoker   . Smokeless tobacco: Never Used  . Alcohol Use: No    Review of Systems  Constitutional: Positive for fatigue. Negative for fever and activity change.  HENT: Negative for congestion.   Eyes: Negative for visual disturbance.  Respiratory: Negative for cough, chest tightness and shortness of breath.   Cardiovascular: Negative for chest pain and palpitations.  Gastrointestinal: Positive for abdominal pain. Negative for nausea and vomiting.  Genitourinary: Negative for dysuria and testicular pain.  Musculoskeletal: Positive for myalgias, back pain and arthralgias.  Skin: Negative for rash.  Neurological: Positive for weakness. Negative for dizziness, light-headedness, numbness and headaches.  A complete 10 system review of systems was obtained and all systems are negative except as noted in the HPI and PMH.      Allergies  Review of patient's allergies indicates no known allergies.  Home Medications   Prior to Admission medications   Medication Sig Start Date End Date Taking? Authorizing Provider  aspirin 325 MG tablet Take 1 tablet (325 mg total) by mouth daily. 07/29/14  Yes Camelia Eng Tysinger, PA-C  atorvastatin (LIPITOR) 40 MG tablet Take 1 tablet (40 mg total)  by mouth daily. 07/30/14  Yes Camelia Eng Tysinger, PA-C  FLUoxetine (PROZAC) 40 MG capsule Take 1 capsule (40 mg total) by mouth daily. 09/24/14  Yes Camelia Eng Tysinger, PA-C  hydrochlorothiazide (HYDRODIURIL) 12.5 MG tablet Take 1 tablet (12.5 mg total) by mouth daily. 08/01/14  Yes Camelia Eng Tysinger, PA-C  HYDROcodone-acetaminophen (NORCO/VICODIN) 5-325 MG per tablet Take 1 tablet by mouth every 6 (six) hours as needed for moderate pain. 10/27/14  Yes Camelia Eng Tysinger, PA-C  insulin aspart  protamine- aspart (NOVOLOG MIX 70/30) (70-30) 100 UNIT/ML injection Inject 0.3 mLs (30 Units total) into the skin 2 (two) times daily with a meal. 07/29/14  Yes Camelia Eng Tysinger, PA-C  levETIRAcetam (KEPPRA) 500 MG tablet Take 1 tablet (500 mg total) by mouth every 12 (twelve) hours. 12/31/13  Yes Camelia Eng Tysinger, PA-C  pregabalin (LYRICA) 75 MG capsule Take 1 capsule (75 mg total) by mouth 2 (two) times daily. 07/29/14  Yes David S Tysinger, PA-C   BP 128/76 mmHg  Pulse 51  Temp(Src) 98.3 F (36.8 C) (Oral)  Resp 13  SpO2 96% Physical Exam  Constitutional: He is oriented to person, place, and time. He appears well-developed and well-nourished. No distress.  Slight dysarthria and L facial droop  HENT:  Head: Normocephalic and atraumatic.  Mouth/Throat: Oropharynx is clear and moist. No oropharyngeal exudate.  Eyes: Conjunctivae and EOM are normal. Pupils are equal, round, and reactive to light.  Neck: Normal range of motion. Neck supple.  No meningismus.  Cardiovascular: Normal rate, regular rhythm, normal heart sounds and intact distal pulses.   No murmur heard. Pulmonary/Chest: Effort normal and breath sounds normal. No respiratory distress. He exhibits tenderness.  TTP lateral ribs.  Abdominal: Soft. There is no tenderness. There is no rebound and no guarding.  Genitourinary:  Normal rectal tone  Musculoskeletal: Normal range of motion. He exhibits tenderness. He exhibits no edema.  Paraspinal thoracic and lumbar tenderness. No midline tenderness 4/5 strength in bilateral lower extremities. Ankle plantar and dorsiflexion intact. Great toe extension intact bilaterally. +2 DP and PT pulses.   Neurological: He is alert and oriented to person, place, and time. A cranial nerve deficit is present. He exhibits normal muscle tone. Coordination abnormal.  L facial droop.  Dysarthria. L sided weakness 4/5, 5/5 strength on R. Ataxic gait.  All baseline findings per patient and fiancee.   Skin: Skin is warm.  Psychiatric: He has a normal mood and affect. His behavior is normal.  Nursing note and vitals reviewed.   ED Course  Procedures (including critical care time) Labs Review Labs Reviewed  CBC WITH DIFFERENTIAL - Abnormal; Notable for the following:    RBC 3.82 (*)    Hemoglobin 11.4 (*)    HCT 33.2 (*)    All other components within normal limits  COMPREHENSIVE METABOLIC PANEL - Abnormal; Notable for the following:    AST 43 (*)    Total Bilirubin 1.4 (*)    All other components within normal limits  URINALYSIS, ROUTINE W REFLEX MICROSCOPIC - Abnormal; Notable for the following:    Glucose, UA 250 (*)    All other components within normal limits  D-DIMER, QUANTITATIVE - Abnormal; Notable for the following:    D-Dimer, Quant 1.07 (*)    All other components within normal limits  LIPASE, BLOOD  TROPONIN I  POC OCCULT BLOOD, ED    Imaging Review Dg Chest 2 View  12/13/2014   CLINICAL DATA:  Bilateral flank pain, radiating from anterior  to posterior. Initial encounter.  EXAM: CHEST  2 VIEW  COMPARISON:  Chest radiograph performed 09/12/2011, and CTA of the chest performed 11/25/2011  FINDINGS: The lungs are well-aerated. Mild retrocardiac opacity may reflect atelectasis or mild pneumonia. There is no evidence of pleural effusion or pneumothorax.  The heart is normal in size; the mediastinal contour is within normal limits. No acute osseous abnormalities are seen.  IMPRESSION: Mild retrocardiac opacity may reflect atelectasis or mild pneumonia.   Electronically Signed   By: Garald Balding M.D.   On: 12/13/2014 11:12   Ct Head Wo Contrast  12/13/2014   CLINICAL DATA:  altered mental status, diabetes  EXAM: CT HEAD WITHOUT CONTRAST  TECHNIQUE: Contiguous axial images were obtained from the base of the skull through the vertex without intravenous contrast.  COMPARISON:  11/17/2013 and earlier studies  FINDINGS: Atherosclerotic and physiologic intracranial calcifications.  Progressive encephalomalacia in the right temporal lobe infarct. Progressive ex vacuo dilatation of the temporal horn right lateral ventricle. Negative for acute intracranial hemorrhage, midline shift, mass, or mass effect. Acute infarct may be inapparent on non contrast CT. Scattered calcifications in the tentorium. Bone windows reveal no acute abnormality.  IMPRESSION: 1. Negative for bleed or other acute intracranial process. 2. Progressive encephalomalacia in the old right temporal infarct.   Electronically Signed   By: Arne Cleveland M.D.   On: 12/13/2014 13:08   Ct Angio Chest Pe W/cm &/or Wo Cm  12/13/2014   CLINICAL DATA:  Left rib pain, shortness of breath, nausea and vomiting for the last 4 days.Hx: DM  EXAM: CT ANGIOGRAPHY CHEST WITH CONTRAST  TECHNIQUE: Multidetector CT imaging of the chest was performed using the standard protocol during bolus administration of intravenous contrast. Multiplanar CT image reconstructions and MIPs were obtained to evaluate the vascular anatomy.  CONTRAST:  16mL OMNIPAQUE IOHEXOL 350 MG/ML SOLN  COMPARISON:  11/25/2011  FINDINGS: 19 mm right thyroid nodule.  No pneumothorax.  The SVC is patent. RV/LV ratio slightly greater than 1. Dilated central pulmonary arteries. Satisfactory opacification of pulmonary arteries noted, and there is no evidence of pulmonary emboli. Patent superior and inferior pulmonary veins bilaterally. Patchy coronary calcifications. Adequate contrast opacification of the thoracic aorta with no evidence of dissection, aneurysm, or stenosis. There is bovine variant brachiocephalic arch anatomy without proximal stenosis.  No pleural or pericardial effusion. No hilar or mediastinal adenopathy. Dependent atelectasis in both lower lobes. 5 mm anterior left upper lobe nodule image 36/406 and smaller nodules in the lingula on image 59 and left lower lobe on images 69 and 71 are stable since studies back dating back to 08/04/2011. No new pulmonary nodule.  Bridging osteophytes across multiple contiguous levels in the lower cervical, mid and lower thoracic spine. Sternum intact. No new lytic or sclerotic osseous lesions. Visualized portions of upper abdomen unremarkable.  Review of the MIP images confirms the above findings.  IMPRESSION: 1. Negative for acute PE or thoracic aortic dissection. 2. Dilated central pulmonary arteries suggesting pulmonary hypertension. 3. No acute  osseous lesion. 4. Stable small bilateral pulmonary nodules since 2012, presumably benign.   Electronically Signed   By: Arne Cleveland M.D.   On: 12/13/2014 13:24   Ct Abdomen Pelvis W Contrast  12/13/2014   CLINICAL DATA:  Diffuse abdominal pain for the last 4 days. Nausea and vomiting Hx: Diverticulitis, Renal Cancer, DM  EXAM: CT ABDOMEN AND PELVIS WITH CONTRAST  TECHNIQUE: Multidetector CT imaging of the abdomen and pelvis was performed using the standard protocol  following bolus administration of intravenous contrast.  CONTRAST:  164mL OMNIPAQUE IOHEXOL 350 MG/ML SOLN  COMPARISON:  08/19/2014  FINDINGS: Minimal dependent atelectasis in the visualized lung bases. Two tiny subpleural nodules in the lateral left lower lobe are stable. Patchy coronary calcifications. Unremarkable liver, gallbladder, spleen, adrenal glands, pancreas, portal vein.  24 mm cryoablation defect in the lower pole left kidney stable. 22 mm cryoablation defect in the upper pole right kidney, stable. No new renal lesion. No hydronephrosis. No adenopathy.  Stomach, small bowel, and colon are nondilated. Normal appendix. Scattered distal descending and sigmoid diverticula without significant adjacent inflammatory/edematous change. Urinary bladder physiologically distended. No ascites. No free air. Bridging osteophytes across multiple segments of the visualized lower thoracic and upper lumbar spine. Exuberant anterior osteophytes in the lower lumbar spine.  IMPRESSION: 1. No acute abdominal process. 2. Stable bilateral  renal cryoablation defects. No new mass or adenopathy. 3. Stable descending and sigmoid diverticulosis.   Electronically Signed   By: Arne Cleveland M.D.   On: 12/13/2014 13:15     EKG Interpretation   Date/Time:  Saturday December 13 2014 10:02:04 EST Ventricular Rate:  58 PR Interval:  204 QRS Duration: 90 QT Interval:  398 QTC Calculation: 391 R Axis:   65 Text Interpretation:  Sinus rhythm Borderline prolonged PR interval Low  voltage, precordial leads Probable anteroseptal infarct, old T wave  flattening laterally Confirmed by Wyvonnia Dusky  MD, Cailyn Houdek [phone]) on 12/13/2014  10:06:03 AM      MDM   Final diagnoses:  Pain  Rib pain  Incontinence  Altered mental status   patient with history of renal cancer remotely presenting with ongoing "nerve pain" in his back and bilateral ribs associated with nausea and bowel and bladder incontinence with sleeping. Ongoing L sided deficits from previous stroke, unchanged.  UA negative. Hemoglobin 11.4 from 14. Hemoccult negative. Normal rectal tone. Labs otherwise unremarkable. Troponin negative. D-dimer positive.  CT negative for PE. There is no intra-abdominal process on CT scan. No new masses or suspicious lesions.  MRI results reviewed with Dr.Jenkins of neurosurgery. No evidence of cord compression or cauda equina. He agrees there is no pathology to explain patient's incontinence. He feels conservative therapy with pain medication is stable. Patient is able to ambulate.  Patient's fianc confided that she is concerned about patient's behavior over the past week. He's been having bizarre paranoid thoughts, anger outbursts, was standing in the parking lot yesterday with a nail clipper  threatening to use it as a weapon.  TTS consulted and patient meets criteria for inpatient treatment.  Holding orders placed.    Ezequiel Essex, MD 12/13/14 1902

## 2014-12-13 NOTE — ED Notes (Signed)
LEFT MESSAGE FOR Brandon [name] - [phone] AS REQUESTED BY DR Wyvonnia Dusky ASKING HIM TO CALL BACK AND RETURN TO ED.

## 2014-12-13 NOTE — ED Notes (Signed)
TTS assessment occuring at this time.

## 2014-12-14 LAB — CBG MONITORING, ED
GLUCOSE-CAPILLARY: 172 mg/dL — AB (ref 70–99)
GLUCOSE-CAPILLARY: 82 mg/dL (ref 70–99)
Glucose-Capillary: 202 mg/dL — ABNORMAL HIGH (ref 70–99)
Glucose-Capillary: 52 mg/dL — ABNORMAL LOW (ref 70–99)
Glucose-Capillary: 146 mg/dL — ABNORMAL HIGH (ref 70–99)

## 2014-12-14 NOTE — ED Notes (Signed)
Pt requesting a sandwich, states he feel like his sugar is getting low. Sandwich and drink provided

## 2014-12-14 NOTE — ED Notes (Signed)
Family at bedside. 

## 2014-12-14 NOTE — ED Notes (Signed)
Pt. Given oj x 2, peanut butter and crackers.

## 2014-12-14 NOTE — BHH Counselor (Signed)
  Accepted by [facility] [phone] Accepting Dr. Karie Georges pending IVC paperwork.    Bedelia Person, M.S., LPCA, [facility] Licensed Professional Counselor Associate  Triage Specialist  [facility]  Therapeutic Triage Services Phone: [phone] Fax: [phone]

## 2014-12-14 NOTE — ED Notes (Signed)
Meal tray ordered 

## 2014-12-14 NOTE — Progress Notes (Signed)
CSW continued placement efforts for patient:  Referral Sent For Placement Consideration: [facility]- Museum/gallery conservator.  Brandon [name] (still to be reviewed)  At [facility]   No Answer: [facility] Posey Boyer, [place] Disposition Social Worker [phone]

## 2014-12-14 NOTE — BH Assessment (Signed)
Received call from [facility] stating Pt has been declined due to medical acuity.  Orpah Greek Rosana Hoes, [facility] Triage Specialist [phone]

## 2014-12-14 NOTE — ED Notes (Signed)
CBG 172; reports feeling better after eating; Pt resting at this time

## 2014-12-15 ENCOUNTER — Telehealth: Payer: Self-pay | Admitting: Medical

## 2014-12-15 ENCOUNTER — Institutional Professional Consult (permissible substitution): Payer: Medicaid Other | Admitting: Medical

## 2014-12-15 LAB — CBG MONITORING, ED
Glucose-Capillary: 35 mg/dL — CL (ref 70–99)
Glucose-Capillary: 69 mg/dL — ABNORMAL LOW (ref 70–99)

## 2014-12-15 NOTE — Progress Notes (Signed)
Per Amy at [facility], [facility] has been accepted by Dr. Kenton Kingfisher into room 920.  Number to call report: [phone], then select option 3. CSW notified ED nurse, [place].   Peri Maris, [place] 12/15/2014 7:47 AM

## 2014-12-15 NOTE — ED Notes (Signed)
Pt. Was given orange juice, peanut butter and graham crackers

## 2014-12-15 NOTE — ED Notes (Signed)
Patient went with [facility] Department without further incident.

## 2014-12-15 NOTE — BHH Counselor (Signed)
Writer called and spoke w/ pt's RN [place]. [place] reports pt was never served findings and custody order by [employer] last night. She says she spoke w/ [place] Magistrate's office who have the faxed IVC paperwork. [place] says she also called [employer] Dept re: pt transport. Waiting for [employer] to served papers so served IVC can be faxed to [place].  Arnold Long, [place] Assessment Counselor

## 2014-12-15 NOTE — Telephone Encounter (Signed)
Pt's wife called to cancel his appt today since he is in the hospital and she wanted to ensure that [name] is following what is going on with the patient while he is in the hospital through hospital notes. She states the hospital is doing a psychiatric eval on the patient.

## 2014-12-15 NOTE — ED Notes (Signed)
On assuming care of patient, this RN contacted [facility] and [place] office to assure that patient had been served IVC paperwork prior to being transferred to [place] made aware. Pt resting in bed.

## 2014-12-23 ENCOUNTER — Encounter: Payer: Self-pay | Admitting: Medical

## 2014-12-23 ENCOUNTER — Ambulatory Visit (INDEPENDENT_AMBULATORY_CARE_PROVIDER_SITE_OTHER): Payer: Medicaid Other | Admitting: Medical

## 2014-12-23 VITALS — BP 118/80 | HR 72 | Temp 97.7°F | Resp 14 | Wt 164.0 lb

## 2014-12-23 DIAGNOSIS — F23 Brief psychotic disorder: Secondary | ICD-10-CM

## 2014-12-23 DIAGNOSIS — I10 Essential (primary) hypertension: Secondary | ICD-10-CM

## 2014-12-23 DIAGNOSIS — Z8673 Personal history of transient ischemic attack (TIA), and cerebral infarction without residual deficits: Secondary | ICD-10-CM

## 2014-12-23 DIAGNOSIS — IMO0002 Reserved for concepts with insufficient information to code with codable children: Secondary | ICD-10-CM

## 2014-12-23 DIAGNOSIS — G8929 Other chronic pain: Secondary | ICD-10-CM

## 2014-12-23 DIAGNOSIS — E1165 Type 2 diabetes mellitus with hyperglycemia: Secondary | ICD-10-CM

## 2014-12-23 DIAGNOSIS — F329 Major depressive disorder, single episode, unspecified: Secondary | ICD-10-CM

## 2014-12-23 DIAGNOSIS — F32A Depression, unspecified: Secondary | ICD-10-CM

## 2014-12-23 LAB — POCT GLYCOSYLATED HEMOGLOBIN (HGB A1C): HEMOGLOBIN A1C: 8.7

## 2014-12-23 MED ORDER — HYDROCODONE-ACETAMINOPHEN 5-325 MG PO TABS: 1.0000 | ORAL_TABLET | Freq: Four times a day (QID) | ORAL | Status: DC | PRN

## 2014-12-23 NOTE — Progress Notes (Signed)
Subjective: Here for hospital f/u.  Here accompanied by his girlfriend [name] byrd.  He went to the [facility] ED recently, and after psychiatry eval, was transferred to [facility] in [place] x 3 days for inpatient stay due to not acting himself, was found out in the street yelling, hadn't slept for days, and threatening to cut somebody with fingernail clippers.   He says he was quickly sent to step down area and feels like he never really needed to be there.  He says he felt depressed and frustrated due to chronic pain, being out of the Hydrocodone and that is what led to him expressing his pain out in the parking lot.   He denies losing his mind.  He says he had no medication change in the inpatient stay.  Was advised to f/u with [facility] Psychiatry in [place] to establish care.   He has no paperwork from the inpatient stay.   No medications added at that visit.    He has seen Dr. Gerald Stabs? In the past for pain management, but after the stroke, they wouldn't see him anymore.   Is taking Lyrica per my orders but that helps only marginally for the chronic pain.    Has seen neuropsychiatry in the past, but the visits seemed unusual as the doctor didn't understand why he was referred there and there were some payment and insurance issue there too.    diabetes - still using 30 units BID novolog 70/30 but lately getting low readings including several in the 50s.  regarding prior medications, didn't tolerate metformin due to loose stools.  He is not sure which BP medication he is currently taking.   Not sleeping well.   Past Medical History  Diagnosis Date  . Hypertension   . Diverticulitis     s/p micorperforation Sept 2012-managed conservatively by Gen surgery  . Kidney tumor 09/2011    Renal cell CA  . Wears glasses   . ED (erectile dysfunction)   . 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 Acute ischemic stroke 11/2013  . Focal seizure 11/2013     due to ischemic stroke  . Diabetes mellitus     DKA prior hospitalization   ROS as in subjective  Objective BP 118/80 mmHg  Pulse 72  Temp(Src) 97.7 F (36.5 C) (Oral)  Resp 14  Wt 164 lb (74.39 kg)  Gen: wd, wn, nad Psych: pleasant, actually speech is clear today than last few visits, seems to be answering questions appropriately   Assessment: Encounter Diagnoses  Name Primary?  . Depression Yes  . Chronic pain   . History of stroke   . Brief psychotic disorder   . Essential hypertension   . Diabetes type 2, uncontrolled    Plan: Depression - c/t Prozac 40mg  daily, we will call back after speaking with neurology to get some guidance.  He has seen neuropsychiatry prior, but there some miscommunications issues and insurance issues.  So he hasn't been back.  Will request records from recent psychiatry inpatient stay  Chronic pain - referral back to neurology  Hx/o stroke, seizures - c/t on Keppra, advised he needs f/u with neurology.    Brief psychotic episode - based on [place] records, he has what appeared to be psychotic episode, but I don't have psychiatry records from his recent inpatient stay.  He attributes the event to depression and being in so much pain, out of pain medications.  He will need to establish with psychiatry at [facility]  HTN - chart today shows HCTZ, but should be on Losartan HCT. Restart Losartan HCT, stop plain HCTZ.  DM type 2 - recent hypoglycemia.  Cut down to 20 units Novolog 70/30 BID, recheck in 3-4 weeks.  Consider other options at next visit.  Goal to avoid hypoglycemia  F/u pending call back.

## 2014-12-24 ENCOUNTER — Other Ambulatory Visit: Payer: Self-pay | Admitting: Medical

## 2014-12-24 ENCOUNTER — Telehealth: Payer: Self-pay | Admitting: Medical

## 2014-12-24 MED ORDER — VALSARTAN-HYDROCHLOROTHIAZIDE 160-12.5 MG PO TABS: 1.0000 | ORAL_TABLET | Freq: Every day | ORAL | Status: DC

## 2014-12-24 NOTE — Telephone Encounter (Signed)
After reviewing chart, here are my recommendations: 1 - Depression - c/t Prozac 40mg  daily, we have requested records from recent psychiatric hospitalization, and f/u with [facility] Psychiatry [place].  Make sure girlfriend has him appt scheduled 2 - Chronic pain, s/p stroke, make f/u appt with neurology for recheck on chronic pain s/p stroke, stroke and medication f/u 3 - he should in fact be on Losartan HCT, and I resent this.  So stop plain Hydrochlorothiazide for BP 4 - Cut down to 20 units Novolog 70/30 BID, recheck in 3-4 weeks. 5 - if he doesn't have a case manager involved, I think it would be wise to make referral to case management

## 2014-12-25 ENCOUNTER — Telehealth: Payer: Self-pay | Admitting: Medical

## 2014-12-25 ENCOUNTER — Other Ambulatory Visit: Payer: Self-pay | Admitting: Medical

## 2014-12-25 MED ORDER — FLUOXETINE HCL 40 MG PO CAPS: 40.0000 mg | ORAL_CAPSULE | Freq: Every day | ORAL | Status: DC

## 2014-12-25 MED ORDER — ATORVASTATIN CALCIUM 40 MG PO TABS: 40.0000 mg | ORAL_TABLET | Freq: Every day | ORAL | Status: DC

## 2014-12-25 MED ORDER — PREGABALIN 75 MG PO CAPS: 75.0000 mg | ORAL_CAPSULE | Freq: Two times a day (BID) | ORAL | Status: DC

## 2014-12-25 MED ORDER — INSULIN ASPART PROT & ASPART (70-30 MIX) 100 UNIT/ML ~~LOC~~ SUSP: SUBCUTANEOUS | Status: DC

## 2014-12-25 NOTE — Telephone Encounter (Signed)
Patient needs you to send more Prozac to [facility]. He only has a couple left and he has been having to take 2 a day, which he said you okayed. Could you increase his dose? He is planning on going to [facility] and says it is a walk in clinic so he does not have an appointment. He has a case Freight forwarder for disability. He is wondering why we are making an appointment with neurologist now but says he will go if we make it. He is clear on BP med change and Novolg change.

## 2014-12-25 NOTE — Progress Notes (Signed)
Faxed info to [facility] for them to schedule appointment for patient.

## 2014-12-26 ENCOUNTER — Other Ambulatory Visit: Payer: Self-pay | Admitting: Medical

## 2014-12-26 MED ORDER — FLUOXETINE HCL 40 MG PO CAPS: 40.0000 mg | ORAL_CAPSULE | Freq: Every day | ORAL | Status: DC

## 2014-12-26 NOTE — Telephone Encounter (Signed)
Per the reply, I am not talking about disability person, but a medical case manager that helps coordinate his care and f/u.   Please make case manager referral.  Neurology referral given the stroke history, medications regarding stroke, chronic pain related to stroke.

## 2015-01-06 NOTE — Telephone Encounter (Signed)
Patient already has an appointment with Dr. Leonie Man on 03/19/15 at [facility].

## 2015-01-09 NOTE — Telephone Encounter (Signed)
Refaxed prior authorization for 2nd request

## 2015-01-12 NOTE — Telephone Encounter (Signed)
I not sure where else we can refer this patient to for case worker.

## 2015-01-12 NOTE — Telephone Encounter (Signed)
See if [place] or [facility] [place] has any ideas

## 2015-01-12 NOTE — Telephone Encounter (Signed)
See if Beverlee Nims has any thoughts

## 2015-01-12 NOTE — Telephone Encounter (Signed)
I ask them both and they had no recommendations either

## 2015-01-13 ENCOUNTER — Other Ambulatory Visit: Payer: Self-pay | Admitting: Medical

## 2015-01-13 ENCOUNTER — Telehealth: Payer: Self-pay | Admitting: Medical

## 2015-01-13 ENCOUNTER — Telehealth: Payer: Self-pay | Admitting: Neurology

## 2015-01-13 MED ORDER — HYDROCODONE-ACETAMINOPHEN 5-325 MG PO TABS: 1.0000 | ORAL_TABLET | Freq: Four times a day (QID) | ORAL | Status: DC | PRN

## 2015-01-13 NOTE — Telephone Encounter (Signed)
Call and ask what other BP meds he has used prior?  insurance is denying current BP medication

## 2015-01-13 NOTE — Telephone Encounter (Signed)
Andria Frames from [place] is calling about mutual patient Brandon [name].  Patient sees PA Dorothea Ogle @[phone]. She states he is completely out of Rx hydrocodone 5-325 mg. Could doctor Leonie Man prescribe a refill for him. Please call PA. Thanks!

## 2015-01-13 NOTE — Telephone Encounter (Signed)
I called over to [facility] to see if the patient's appointment could be moved up and the lady Hassan Rowan check there system and there was not anymore earlier appointments. I left a message for the nurse of Dr. Leonie Man office to see if they could refill his pain medication until his next appointment.

## 2015-01-13 NOTE — Telephone Encounter (Signed)
I informed the patient's girlfriend of the message from [place] PA in references to the refills on the patient.

## 2015-01-13 NOTE — Telephone Encounter (Signed)
Beverlee Nims, do you have any recommendations on getting this patient set up with some kind of social worker. [facility] can't take him because he has medicaid and they don't except medicaid.

## 2015-01-13 NOTE — Telephone Encounter (Signed)
I would recommend [facility],  please start with [facility] and Education Social Worker [phone] or cell [phone]

## 2015-01-13 NOTE — Telephone Encounter (Signed)
This was part of the reason why I referred back to neurology was to help eval and decide treatment for his pain long term related to prior stroke.

## 2015-01-13 NOTE — Telephone Encounter (Signed)
Rx was given

## 2015-01-13 NOTE — Telephone Encounter (Signed)
Ins denied P.A. Pt must try Ace inhibitor first Lisinopril, Ramipril, Enalapril, Captopril then if fails must use brand Diovan HCT.  Do you want to switch?

## 2015-01-13 NOTE — Telephone Encounter (Signed)
I informed the patient's girlfriend that Dorothea Ogle PA will refill the pain medication for another 30 days but they will have to check with Dr. Leonie Man for future pain medication refills.

## 2015-01-13 NOTE — Telephone Encounter (Signed)
Pt's girlfriend called and pt needs refill on pain meds. Please call [phone] when ready.

## 2015-01-14 NOTE — Telephone Encounter (Signed)
Called and left Andria Frames a message, to return the call to me, ext number was also given.  PA Dorothea Ogle refilled medication on 01/13/15.

## 2015-01-14 NOTE — Telephone Encounter (Signed)
LMOM FOR THE GIRLFRIEND TO CALL BACK

## 2015-01-14 NOTE — Telephone Encounter (Signed)
Brandon [name] returned my call states that he is having pain on the side that he had the stroke on. PA Dorothea Ogle did refill Hydrocodone medication but she feels that patient will be run out quickly. Patient has an appointment with Dr Leonie Man in April will try to move this appointment up.

## 2015-01-14 NOTE — Telephone Encounter (Signed)
Outreach and education social worker [phone] and I left message for some one to call back about this patient

## 2015-01-15 ENCOUNTER — Other Ambulatory Visit: Payer: Self-pay | Admitting: Medical

## 2015-01-15 MED ORDER — TELMISARTAN-HCTZ 80-12.5 MG PO TABS: 1.0000 | ORAL_TABLET | Freq: Every day | ORAL | Status: DC

## 2015-01-15 NOTE — Telephone Encounter (Signed)
I sent micardis HCT as alternate if they won't cover valsartan HCT

## 2015-01-15 NOTE — Telephone Encounter (Signed)
Ok I agree with plan

## 2015-01-15 NOTE — Telephone Encounter (Signed)
Has used Micardis HCT in the past.

## 2015-01-19 ENCOUNTER — Other Ambulatory Visit: Payer: Self-pay | Admitting: Medical

## 2015-01-19 MED ORDER — CAPTOPRIL-HYDROCHLOROTHIAZIDE 25-15 MG PO TABS: 1.0000 | ORAL_TABLET | Freq: Every day | ORAL | Status: DC

## 2015-01-19 NOTE — Telephone Encounter (Signed)
Recv'd fax from pharmacy Micardis (telmisartan-HCTZ) also requires P.A..   See previous note, Pt must try Ace inhibitor first Lisinopril, Ramipril, Enalapril, or Captopril.  Can you switch to one of these medications?

## 2015-01-19 NOTE — Telephone Encounter (Signed)
[facility] went thru ins for a $3 co pay but out of stock til tomorrow.  Left message for pt

## 2015-01-19 NOTE — Telephone Encounter (Signed)
LMOM FOR THE OUTREACH AND SOCIAL WORK TO CALL BACK.

## 2015-01-19 NOTE — Telephone Encounter (Signed)
Lets try captopril HCT.  meds sent, let him know.  This is in place of Valsartan HCT or Diovan HCT

## 2015-01-27 NOTE — Telephone Encounter (Signed)
Brandon [name], I have tried to call this person that Beverlee Nims recommended but no one will return my phone call. I'm not sure what else to do or whom else to call

## 2015-01-27 NOTE — Telephone Encounter (Signed)
If he doesn't qualify for [facility] case worker, then make a home health referral for evaluation of home environment for needs.

## 2015-01-28 NOTE — Telephone Encounter (Signed)
I fax over a referral to [facility] fax # [phone]

## 2015-01-29 NOTE — Telephone Encounter (Signed)
Evelene Croon @ cell [phone] called regarding this patient. Please call her regarding specific issues with patient. She said she will refer him to one of her case manager who works with Medicaid. She is with [facility].

## 2015-01-30 NOTE — Telephone Encounter (Signed)
I spoke Catalina Lunger at [employer] care and she ask me to fax over the patients information and she will get the patient connected with a case worker who handles medicaid patients I fax over the patient information to fax # [phone]

## 2015-03-20 ENCOUNTER — Ambulatory Visit: Payer: Medicaid Other | Admitting: Nurse Practitioner

## 2015-03-20 ENCOUNTER — Encounter: Payer: Self-pay | Admitting: Neurology

## 2015-03-20 ENCOUNTER — Ambulatory Visit (INDEPENDENT_AMBULATORY_CARE_PROVIDER_SITE_OTHER): Payer: Medicaid Other | Admitting: Neurology

## 2015-03-20 VITALS — BP 127/83 | HR 59 | Resp 16 | Ht 72.0 in | Wt 265.0 lb

## 2015-03-20 DIAGNOSIS — G441 Vascular headache, not elsewhere classified: Secondary | ICD-10-CM | POA: Diagnosis not present

## 2015-03-20 DIAGNOSIS — R200 Anesthesia of skin: Secondary | ICD-10-CM | POA: Diagnosis not present

## 2015-03-20 MED ORDER — SUMATRIPTAN SUCCINATE 100 MG PO TABS: 100.0000 mg | ORAL_TABLET | Freq: Once | ORAL | Status: DC | PRN

## 2015-03-20 MED ORDER — TOPIRAMATE 25 MG PO TABS: 25.0000 mg | ORAL_TABLET | Freq: Two times a day (BID) | ORAL | Status: DC

## 2015-03-20 NOTE — Progress Notes (Signed)
[facility] [address] [place]. [place] [address] [phone]       OFFICE CONSULT NOTE  [name] Date of Birth:  1961-08-19 Medical Record Number:  [id number]   Referring MD:  Chana Bode PA-C Reason for Referral:  Headache and numbness  HPI: 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 He has residual mild left face and hand weakness from that. But he has history of significant underlying psychiatric problems including depression as well as suicidal ideation and was hospitalized in [place] for a few days and currently is followed as an outpatient in [place]. He isn't accompanied today by his fiance who stated that he has been having the last year and a half left hemicranial headaches as well as some intermittent paresthesias affecting both hands and left leg. He describes the headache as being moderate to severe throbbing in nature not accompanied by nausea light or sound sensitivity. The headache is 9/10 in severity. He has been reluctant to take over-the-counter pain pills because he of history of kidney cancer and not wanting to hurt his kidney. He has been on long-term narcotics due to chronic back pain but he has run out of the medicine recently but when he was taking at it did help his headaches. He denies any double vision, vertigo, focal extremity weakness and numbness accompanying his headaches. He denies any prior history of migraine headaches or family history of the same. He does complain of chronic neck pain as well as restriction of neck movements. He denies true radicular pain. He describes intermittent numbness involving his arms as well as left high which has no relationship with his headaches. He has not tried any medication for these. He has had mild speech difficulties, facial weakness as well as left hand weakness from his prior stroke  which is unchanged. He has not had any recent brain or spine imaging studies done. He remains on aspirin for stroke prevention as well as Lipitor and is tolerating both medications well. He was last seen in the office on 09/18/14 for his stroke and appears stable from that standpoint. He states his blood pressure and sugar both have been under good control  ROS:   14 system review of systems is positive for headache, memory loss, speech difficulty, tremor, insomnia, snoring, itching, rash, hyperactive, suicidal thoughts, hallucinations, anxiety nervousness, depression, confusion, behavioral problem and agitation  PMH:  Past Medical History  Diagnosis Date  . Hypertension   . Diverticulitis     s/p micorperforation Sept 2012-managed conservatively by Gen surgery  . Kidney tumor 09/2011    Renal cell CA  . Wears glasses   . ED (erectile dysfunction)   . 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 Acute ischemic stroke 11/2013  . Focal seizure 11/2013    due to ischemic stroke  . Diabetes mellitus     DKA prior hospitalization  . Depression     BH Adm in [place] Depression    Social History:  History   Social History  . Marital Status: Single    Spouse Name: N/A  . Number of Children: 2  . Years of Education: college   Occupational History  .     Social History Main Topics  . Smoking status: Never Smoker   . Smokeless tobacco: Never Used  . Alcohol Use: No  .  Drug Use: No  . Sexual Activity: No   Other Topics Concern  . Not on file   Social History Narrative   Patient lives at home with his family.   Education. Two years of college.   Not working.   Right handed.   Caffeine one soda daily.    Medications:   Current Outpatient Prescriptions on File Prior to Visit  Medication Sig Dispense Refill  . aspirin 325 MG tablet Take 1 tablet (325 mg total) by mouth daily. 30 tablet 11  . atorvastatin (LIPITOR) 40 MG tablet Take 1 tablet  (40 mg total) by mouth daily. 90 tablet 1  . captopril-hydrochlorothiazide (CAPOZIDE) 25-15 MG per tablet Take 1 tablet by mouth daily. 30 tablet 2  . FLUoxetine (PROZAC) 40 MG capsule Take 1 capsule (40 mg total) by mouth daily. 30 capsule 0  . insulin aspart protamine- aspart (NOVOLOG MIX 70/30) (70-30) 100 UNIT/ML injection 20 units BID 10 mL 2  . levETIRAcetam (KEPPRA) 500 MG tablet Take 1 tablet (500 mg total) by mouth every 12 (twelve) hours. (Patient taking differently: Take 500 mg by mouth daily. ) 60 tablet 2  . HYDROcodone-acetaminophen (NORCO/VICODIN) 5-325 MG per tablet Take 1 tablet by mouth every 6 (six) hours as needed for moderate pain. (Patient not taking: Reported on 03/20/2015) 30 tablet 0  . pregabalin (LYRICA) 75 MG capsule Take 1 capsule (75 mg total) by mouth 2 (two) times daily. (Patient not taking: Reported on 03/20/2015) 60 capsule 2   No current facility-administered medications on file prior to visit.    Allergies:  No Known Allergies  Physical Exam General: Mildly obese middle-aged African-American male, seated, in no evident distress Head: head normocephalic and atraumatic.   Neck: supple with no carotid or supraclavicular bruits Cardiovascular: regular rate and rhythm, no murmurs Musculoskeletal: no deformity Skin:  no rash/petichiae Vascular:  Normal pulses all extremities  Neurologic Exam Mental Status: Awake and fully alert. Oriented to place and time. Recent and remote memory intact. Attention span, concentration and fund of knowledge appropriate. Mood and affect appropriate. Speech is hesitant and at times pressured and slurred but can be understood. Cranial Nerves: Fundoscopic exam reveals sharp disc margins. Pupils equal, briskly reactive to light. Extraocular movements full without nystagmus. Visual fields full to confrontation. Hearing intact. Facial sensation intact. Mild left lower facial asymmetry and weakness., tongue, palate moves normally and  symmetrically.  Motor: Normal bulk and tone. Normal strength in all tested extremity muscles except mild left grip weakness with poor effort and diminished fine finger movements on the left. Orbits right over left upper extremity.. Intermittent mild action tremor of her left upper extremity which is fine tone is increased in the left leg with mild 4/5 weakness proximally and mild left foot drop Sensory.: Diminished to touch , pinprick , position and vibratory sensation on the left hemibody including forehead, splitting the midline. Splits the forehead for vibratory sensation.  Coordination: Rapid alternating movements normal in all extremities. Finger-to-nose and heel-to-shin performed accurately bilaterally. Gait and Station: Arises from chair without difficulty. Stance is normal. Gait demonstrates normal stride length and balance . Able to heel, toe and tandem walk without difficulty.  Reflexes: 2+ and asymmetric and brisker on the left. Toes downgoing.   NIHSS  3 Modified Rankin 2   ASSESSMENT: 25 year African-American male with remote history of right middle cerebral artery infarct 2 years ago of cryptogenic etiology with new complaints of left temporal headaches as well as bilateral upper extremity and  left lower extremity paresthesias of unclear etiology. Need to rule out structural brain lesions as well as compressive cervical spine disease    PLAN: I had a long discussion with the patient and his fiance about his new complaints of headache and numbness. I personally reviewed the available imaging studies and prior notes and electronic medical records. His headaches sound like migraine headaches and will treat with Imitrex for symptomatic relief and Topamax for migraine prophylaxis. Numbness in his hands and leg may be post stroke residue though compressive cervical cord problem from degenerative disease is another possibility which needs to be looked for. Check MRI scan of cervical spine,  brain. Continue aspirin for stroke prevention with strict control of hypertension with blood pressure goal below 130/90 and lipids with LDL cholesterol goal below 70 mg percent. Continue follow-up with his primary physician for his chronic back pain and psychiatric issues. Return for follow-up in 2 months or call earlier if necessary.   Note: This document was prepared with digital dictation and possible smart phrase technology. Any transcriptional errors that result from this process are unintentional.

## 2015-03-20 NOTE — Patient Instructions (Signed)
I had a long discussion with the patient and his fiance about his new complaints of headache and numbness. I personally reviewed the available imaging studies and prior notes and electronic medical records. His headaches sound like migraine headaches and will treat with Imitrex for symptomatic relief and Topamax for migraine prophylaxis. Numbness in his hands and leg may be post stroke residue though compressive cervical cord problem from degenerative disease is another possibility which needs to be looked for. Check MRI scan of cervical spine, brain. Continue aspirin for stroke prevention with strict control of hypertension with blood pressure goal below 130/90 and lipids with LDL cholesterol goal below 70 mg percent. Continue follow-up with his primary physician for his chronic back pain and psychiatric issues. Return for follow-up in 2 months or call earlier if necessary.

## 2015-04-21 DIAGNOSIS — Z0289 Encounter for other administrative examinations: Secondary | ICD-10-CM

## 2015-04-23 ENCOUNTER — Ambulatory Visit (INDEPENDENT_AMBULATORY_CARE_PROVIDER_SITE_OTHER): Payer: Medicaid Other | Admitting: Medical

## 2015-04-23 ENCOUNTER — Other Ambulatory Visit: Payer: Self-pay | Admitting: Medical

## 2015-04-23 ENCOUNTER — Encounter: Payer: Self-pay | Admitting: Medical

## 2015-04-23 ENCOUNTER — Telehealth: Payer: Self-pay | Admitting: Medical

## 2015-04-23 VITALS — BP 130/78 | HR 80 | Temp 98.0°F | Resp 16 | Wt 261.0 lb

## 2015-04-23 DIAGNOSIS — M549 Dorsalgia, unspecified: Secondary | ICD-10-CM | POA: Diagnosis not present

## 2015-04-23 DIAGNOSIS — I10 Essential (primary) hypertension: Secondary | ICD-10-CM

## 2015-04-23 DIAGNOSIS — R479 Unspecified speech disturbances: Secondary | ICD-10-CM

## 2015-04-23 DIAGNOSIS — Z8673 Personal history of transient ischemic attack (TIA), and cerebral infarction without residual deficits: Secondary | ICD-10-CM

## 2015-04-23 DIAGNOSIS — G8929 Other chronic pain: Secondary | ICD-10-CM | POA: Diagnosis not present

## 2015-04-23 DIAGNOSIS — E785 Hyperlipidemia, unspecified: Secondary | ICD-10-CM | POA: Diagnosis not present

## 2015-04-23 DIAGNOSIS — Z85528 Personal history of other malignant neoplasm of kidney: Secondary | ICD-10-CM

## 2015-04-23 DIAGNOSIS — E1165 Type 2 diabetes mellitus with hyperglycemia: Secondary | ICD-10-CM | POA: Diagnosis not present

## 2015-04-23 DIAGNOSIS — R937 Abnormal findings on diagnostic imaging of other parts of musculoskeletal system: Secondary | ICD-10-CM | POA: Diagnosis not present

## 2015-04-23 DIAGNOSIS — IMO0002 Reserved for concepts with insufficient information to code with codable children: Secondary | ICD-10-CM

## 2015-04-23 DIAGNOSIS — R4702 Dysphasia: Secondary | ICD-10-CM

## 2015-04-23 DIAGNOSIS — F32A Depression, unspecified: Secondary | ICD-10-CM

## 2015-04-23 DIAGNOSIS — F329 Major depressive disorder, single episode, unspecified: Secondary | ICD-10-CM

## 2015-04-23 LAB — COMPREHENSIVE METABOLIC PANEL
ALT: 23 U/L (ref 0–53)
AST: 21 U/L (ref 0–37)
Albumin: 4.5 g/dL (ref 3.5–5.2)
Alkaline Phosphatase: 105 U/L (ref 39–117)
BUN: 17 mg/dL (ref 6–23)
CALCIUM: 9.9 mg/dL (ref 8.4–10.5)
CO2: 28 meq/L (ref 19–32)
Chloride: 100 mEq/L (ref 96–112)
Creat: 1.05 mg/dL (ref 0.50–1.35)
Glucose, Bld: 75 mg/dL (ref 70–99)
POTASSIUM: 3.5 meq/L (ref 3.5–5.3)
Sodium: 138 mEq/L (ref 135–145)
Total Bilirubin: 0.7 mg/dL (ref 0.2–1.2)
Total Protein: 7.8 g/dL (ref 6.0–8.3)

## 2015-04-23 LAB — CBC
HEMATOCRIT: 39.6 % (ref 39.0–52.0)
Hemoglobin: 13.7 g/dL (ref 13.0–17.0)
MCH: 29.8 pg (ref 26.0–34.0)
MCHC: 34.6 g/dL (ref 30.0–36.0)
MCV: 86.1 fL (ref 78.0–100.0)
MPV: 11.4 fL (ref 8.6–12.4)
PLATELETS: 231 10*3/uL (ref 150–400)
RBC: 4.6 MIL/uL (ref 4.22–5.81)
RDW: 12.5 % (ref 11.5–15.5)
WBC: 4.6 10*3/uL (ref 4.0–10.5)

## 2015-04-23 LAB — POCT URINALYSIS DIPSTICK
Bilirubin, UA: NEGATIVE
Ketones, UA: NEGATIVE
Leukocytes, UA: NEGATIVE
Nitrite, UA: NEGATIVE
Protein, UA: NEGATIVE
Spec Grav, UA: 1.02
Urobilinogen, UA: NEGATIVE
pH, UA: 5.5

## 2015-04-23 LAB — LIPID PANEL
Cholesterol: 107 mg/dL (ref 0–200)
HDL: 38 mg/dL — ABNORMAL LOW (ref >=40)
LDL CALC: 58 mg/dL (ref 0–99)
Total CHOL/HDL Ratio: 2.8 Ratio
Triglycerides: 54 mg/dL (ref <150)
VLDL: 11 mg/dL (ref 0–40)

## 2015-04-23 LAB — POCT GLYCOSYLATED HEMOGLOBIN (HGB A1C): Hemoglobin A1C: 12.2

## 2015-04-23 MED ORDER — INSULIN ASPART PROT & ASPART (70-30 MIX) 100 UNIT/ML ~~LOC~~ SUSP: SUBCUTANEOUS | Status: DC

## 2015-04-23 MED ORDER — CAPTOPRIL-HYDROCHLOROTHIAZIDE 25-15 MG PO TABS: 1.0000 | ORAL_TABLET | Freq: Every day | ORAL | Status: DC

## 2015-04-23 MED ORDER — ATORVASTATIN CALCIUM 40 MG PO TABS: 40.0000 mg | ORAL_TABLET | Freq: Every day | ORAL | Status: DC

## 2015-04-23 MED ORDER — DULOXETINE HCL 30 MG PO CPEP: 30.0000 mg | ORAL_CAPSULE | Freq: Every day | ORAL | Status: DC

## 2015-04-23 MED ORDER — ASPIRIN 325 MG PO TABS: 325.0000 mg | ORAL_TABLET | Freq: Every day | ORAL | Status: DC

## 2015-04-23 NOTE — Telephone Encounter (Signed)
1. Refer to endocrinology for uncontrolled diabets.  In the meantime, have him increase to Novolog 70/30, 30 units BID . Have him check glucose and log the numbers before meals TID  2. Begin Cymbalta 30mg  for depression and pain  Counseling services  [facility] Juliette Mangle, psychotherapist [place], [place] [address]  Or   [facility] [address], [place], [place] [address] [phone]  See other message about referral for back pain.  See if we can hunt down prior records on renal cell cancer.  Pull paper chart

## 2015-04-23 NOTE — Progress Notes (Signed)
Subjective: Here for routine f/u and med check  Medical Team:  Sees radiologist yearly for surveillance of prior renal cell cancer  Dr. Antony Contras, neurology  TYSINGER, DAVID [facility], PA-C here for primary care  Here for diabetes f/u.   Needs glucometer as he has been using girlfriend.   Seeing 130s.  Highest is 173.  Lowest has been was 84.  Using Novology mix 25 BID.    Hypertension - Taking Captopril HCT for BP.  Checking BPs at home and they look fine.  Hyperlipidemia - Taking Lipitor 40mg  daily along with 325mg  aspirin daily.  Depressed Mood - not seeing any specialist currently.    Hx/o renal cancer - Sees Dr. Andria Frames with [facility] regularly for surveillance.    Hx/o stroke - saw neurology recently, put on Topamax.  Has chronic low back and abdominal pain, stays in pain around abdomen and low back.   Neurologist told him to f/u here.    Past Medical History  Diagnosis Date  . Hypertension   . Diverticulitis     s/p micorperforation Sept 2012-managed conservatively by Gen surgery  . Kidney tumor 09/2011    Renal cell CA  . Wears glasses   . ED (erectile dysfunction)   . 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 Acute ischemic stroke 11/2013  . Focal seizure 11/2013    due to ischemic stroke  . Diabetes mellitus     DKA prior hospitalization  . Depression     BH Adm in [place] Depression    Objective: BP 130/78 mmHg  Pulse 80  Temp(Src) 98 F (36.7 C) (Oral)  Resp 16  Wt 261 lb (118.389 kg)  General appearence: alert, no distress, WD/WN Neck: supple, no lymphadenopathy, no thyromegaly, no masses Heart: RRR, normal S1, S2, no murmurs Lungs: CTA bilaterally, no wheezes, rhonchi, or rales Pulses: 2+ symmetric, upper and lower extremities, normal cap refill Psych: crying at times, speech stuttered, slow    Assessment: Encounter Diagnoses  Name Primary?  . Diabetes type 2, uncontrolled Yes  .  Essential hypertension   . Chronic back pain   . Abnormal MRI, thoracic spine   . Abnormal MRI, lumbar spine   . History of stroke   . Speech defect   . History of renal carcinoma   . Depression   . Hyperlipidemia    Plan: Diabetes type 2 - labs today, HgbA1C 12.2% today, increase to 30 units BID novolog 70/30 and referral to endocrinology  [facility]N - c/t Captopril HCT  Chronic back pain, abnormal T and L spine MRI - reviewed MRIs, will review records and call back with specialist recommendations  Hx/o stroke, Speech defect -reviewed recent neurology consult notes from April 2016  Hx/o renal cell carcinoma - will request prior records  Depression - begin Cymbalta 30mg  daily for depression and pain,  Referral to counseling.  Hyperlipidemia - c/t Lipitor, ASA daily  Brandon [name] was seen today for diabetes.  Diagnoses and all orders for this visit:  Diabetes type 2, uncontrolled Orders: -     Microalbumin / creatinine urine ratio -     HM DIABETES EYE EXAM -     Comprehensive metabolic panel -     CBC -     Lipid panel -     Urinalysis Dipstick -     HgB A1c  Essential hypertension Orders: -     Microalbumin / creatinine urine ratio -  HM DIABETES EYE EXAM -     Comprehensive metabolic panel -     CBC -     Lipid panel -     Urinalysis Dipstick -     HgB A1c  Chronic back pain  Abnormal MRI, thoracic spine  Abnormal MRI, lumbar spine  History of stroke  Speech defect  History of renal carcinoma  Depression  Hyperlipidemia  Other orders -     insulin aspart protamine- aspart (NOVOLOG MIX 70/30) (70-30) 100 UNIT/ML injection; 30 units BID -     captopril-hydrochlorothiazide (CAPOZIDE) 25-15 MG per tablet; Take 1 tablet by mouth daily. -     atorvastatin (LIPITOR) 40 MG tablet; Take 1 tablet (40 mg total) by mouth daily. -     aspirin 325 MG tablet; Take 1 tablet (325 mg total) by mouth daily. -     DULoxetine (CYMBALTA) 30 MG capsule; Take 1 capsule (30 mg  total) by mouth daily.  F/u pending call back

## 2015-04-24 LAB — MICROALBUMIN / CREATININE URINE RATIO
Creatinine, Urine: 81.5 mg/dL
MICROALB UR: 0.4 mg/dL (ref <2.0)
MICROALB/CREAT RATIO: 4.9 mg/g (ref 0.0–30.0)

## 2015-04-24 NOTE — Telephone Encounter (Signed)
In addition to below, refer to counseling  F/u here in 46mo

## 2015-04-26 ENCOUNTER — Other Ambulatory Visit: Payer: Self-pay | Admitting: Medical

## 2015-04-27 NOTE — Telephone Encounter (Signed)
Patients significant other is aware of the message from [facility] PA and she is also aware of the referral. Referral was fax over to [facility] and they will contact the patient for his appointment. They do except Medicaid insurance

## 2015-05-15 ENCOUNTER — Encounter: Payer: Self-pay | Admitting: Family Medicine

## 2015-05-15 ENCOUNTER — Other Ambulatory Visit: Payer: Self-pay | Admitting: Family Medicine

## 2015-05-15 ENCOUNTER — Telehealth: Payer: Self-pay

## 2015-05-15 MED ORDER — LEVETIRACETAM 500 MG PO TABS: 500.0000 mg | ORAL_TABLET | Freq: Two times a day (BID) | ORAL | Status: DC

## 2015-05-15 MED ORDER — LORAZEPAM 0.5 MG PO TABS: 0.5000 mg | ORAL_TABLET | Freq: Four times a day (QID) | ORAL | Status: DC | PRN

## 2015-05-15 MED ORDER — PREGABALIN 100 MG PO CAPS: 100.0000 mg | ORAL_CAPSULE | Freq: Three times a day (TID) | ORAL | Status: DC

## 2015-05-15 MED ORDER — LIDOCAINE 5 % EX PTCH: 1.0000 | MEDICATED_PATCH | CUTANEOUS | Status: DC

## 2015-05-15 MED ORDER — HYDROMORPHONE HCL 2 MG PO TABS: 2.0000 mg | ORAL_TABLET | Freq: Four times a day (QID) | ORAL | Status: DC | PRN

## 2015-05-15 MED ORDER — DULOXETINE HCL 30 MG PO CPEP: 30.0000 mg | ORAL_CAPSULE | Freq: Every day | ORAL | Status: DC

## 2015-05-15 MED ORDER — INSULIN ASPART PROT & ASPART (70-30 MIX) 100 UNIT/ML ~~LOC~~ SUSP: SUBCUTANEOUS | Status: DC

## 2015-05-15 MED ORDER — METHOCARBAMOL 500 MG PO TABS: 500.0000 mg | ORAL_TABLET | Freq: Three times a day (TID) | ORAL | Status: DC | PRN

## 2015-05-15 NOTE — Telephone Encounter (Signed)
Dr.Lalonde the pharmacist called and stated patient was on novolog 70/30 mix and humulin 70/30 30 units at 8 am and 30 units at 5 pm that is the RX that they need please advise

## 2015-05-15 NOTE — Progress Notes (Signed)
He was recently [place]. The record is in [place]. The discharge summary was reviewed. I rewrote the prescriptions that were given to him. He is scheduled for a follow-up visit here in the next several days. He will also need follow-up with neurosurgery concerning his cervical fractures.

## 2015-05-18 ENCOUNTER — Ambulatory Visit (INDEPENDENT_AMBULATORY_CARE_PROVIDER_SITE_OTHER): Payer: Medicaid Other | Admitting: Medical

## 2015-05-18 ENCOUNTER — Encounter: Payer: Self-pay | Admitting: Medical

## 2015-05-18 ENCOUNTER — Telehealth: Payer: Self-pay | Admitting: Medical

## 2015-05-18 VITALS — BP 92/60 | HR 74 | Resp 15 | Wt 253.0 lb

## 2015-05-18 DIAGNOSIS — E1165 Type 2 diabetes mellitus with hyperglycemia: Secondary | ICD-10-CM

## 2015-05-18 DIAGNOSIS — IMO0002 Reserved for concepts with insufficient information to code with codable children: Secondary | ICD-10-CM

## 2015-05-18 DIAGNOSIS — Z8673 Personal history of transient ischemic attack (TIA), and cerebral infarction without residual deficits: Secondary | ICD-10-CM

## 2015-05-18 DIAGNOSIS — M79632 Pain in left forearm: Secondary | ICD-10-CM

## 2015-05-18 DIAGNOSIS — Z85528 Personal history of other malignant neoplasm of kidney: Secondary | ICD-10-CM | POA: Diagnosis not present

## 2015-05-18 DIAGNOSIS — M542 Cervicalgia: Secondary | ICD-10-CM | POA: Diagnosis not present

## 2015-05-18 DIAGNOSIS — S129XXA Fracture of neck, unspecified, initial encounter: Secondary | ICD-10-CM

## 2015-05-18 DIAGNOSIS — F329 Major depressive disorder, single episode, unspecified: Secondary | ICD-10-CM | POA: Diagnosis not present

## 2015-05-18 DIAGNOSIS — F32A Depression, unspecified: Secondary | ICD-10-CM

## 2015-05-18 MED ORDER — DULOXETINE HCL 60 MG PO CPEP: 60.0000 mg | ORAL_CAPSULE | Freq: Every day | ORAL | Status: DC

## 2015-05-18 MED ORDER — INSULIN ASPART PROT & ASPART (70-30 MIX) 100 UNIT/ML ~~LOC~~ SUSP: SUBCUTANEOUS | Status: DC
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
## 2015-05-18 NOTE — Telephone Encounter (Signed)
Refer to endocrinology for uncontrolled diabetes  Refer to psychiatry for consult on depression

## 2015-05-18 NOTE — Telephone Encounter (Signed)
Refer also to neurosurgery for cervical vertebral fracture

## 2015-05-19 NOTE — Telephone Encounter (Signed)
I fax over his information to [facility] and I am awaiting his appointment. There office will call the patient to set up the appointment

## 2015-05-19 NOTE — Telephone Encounter (Signed)
I put orders in EPIC for [facility] endocrinology and they will contact the patient by phone for his appointment.

## 2015-05-20 NOTE — Telephone Encounter (Signed)
Patient has an appointment with [facility] on 06/11/15.  I called [facility] and they do accept Medicaid,. I registered the patient but they ask me to have the patient call an make the appointment. I spoke with [name] his significant other an gave her all the information to call and make the patients appointment. [facility] [address] [place], [place]

## 2015-05-21 ENCOUNTER — Telehealth: Payer: Self-pay | Admitting: Medical

## 2015-05-21 NOTE — Telephone Encounter (Signed)
Please find out who monitors his surveillance for history of renal cancer?  I need the last visit notes as we don't have them  Please copy and return FMLA form.  Of note, wife needs to complete the section II about the care she will provide for her husband.

## 2015-05-22 ENCOUNTER — Telehealth: Payer: Self-pay | Admitting: Family Medicine

## 2015-05-22 NOTE — Telephone Encounter (Signed)
I spoke with [name] and she said he was being followed by Dr. Kathlene Cote. This is a radiologists. She said the last thing he had done was 08/2014 notes are in EPIC. Per Lisa's request FMLA forms will be mailed to her.

## 2015-05-22 NOTE — Telephone Encounter (Signed)
pls print a note if you see it as I dont

## 2015-05-22 NOTE — Telephone Encounter (Signed)
Per [name] due to P.A. Lyrica, will continue pt on Cymbalta & P.A. Lidocaine not needed

## 2015-05-22 NOTE — Telephone Encounter (Signed)
I spoke with the people at [facility] radiology where Dr. Kathlene Cote is a radiologists at and they said that Dr. Kathlene Cote just takes yearly Ct Abdomen/pelvis w and wo contrast on this patient to keep watch on the renal cancer but he doesn;'t see a office doctor for this.Dorothea Ogle PA-C is aware of this message verbally

## 2015-06-04 DIAGNOSIS — Z0289 Encounter for other administrative examinations: Secondary | ICD-10-CM

## 2015-06-11 ENCOUNTER — Ambulatory Visit: Payer: Medicaid Other | Admitting: Neurology

## 2015-06-11 LAB — HM DIABETES EYE EXAM

## 2015-06-16 ENCOUNTER — Encounter: Payer: Self-pay | Admitting: Internal Medicine

## 2015-07-07 ENCOUNTER — Other Ambulatory Visit: Payer: Self-pay | Admitting: Medical

## 2015-07-13 ENCOUNTER — Encounter (INDEPENDENT_AMBULATORY_CARE_PROVIDER_SITE_OTHER): Payer: Medicaid Other | Admitting: Neurology

## 2015-07-13 ENCOUNTER — Ambulatory Visit
Admission: RE | Admit: 2015-07-13 | Discharge: 2015-07-13 | Disposition: A | Discharge status: Home / Self Care | Payer: Medicaid Other | Source: Ambulatory Visit | Attending: Neurology | Admitting: Neurology

## 2015-07-13 ENCOUNTER — Other Ambulatory Visit: Payer: Medicaid Other

## 2015-07-13 ENCOUNTER — Inpatient Hospital Stay: Admission: RE | Admit: 2015-07-13 | Payer: Medicaid Other | Source: Ambulatory Visit

## 2015-07-13 DIAGNOSIS — R200 Anesthesia of skin: Secondary | ICD-10-CM | POA: Diagnosis not present

## 2015-07-13 DIAGNOSIS — G441 Vascular headache, not elsewhere classified: Secondary | ICD-10-CM

## 2015-07-13 MED ORDER — GADOBENATE DIMEGLUMINE 529 MG/ML IV SOLN
20.0000 mL | Freq: Once | INTRAVENOUS | Status: AC | PRN
  Administered 2015-07-13: 20 mL via INTRAVENOUS

## 2015-07-14 ENCOUNTER — Telehealth: Payer: Self-pay

## 2015-07-14 ENCOUNTER — Institutional Professional Consult (permissible substitution): Payer: Medicaid Other | Admitting: Medical

## 2015-07-14 NOTE — Telephone Encounter (Signed)
I called patient and left message that I looked back in his prior records from 2014 which did confirm that his his stroke was on the right side of the brain  indeed. I asked the patient to call me back if he still had questions

## 2015-07-15 NOTE — Telephone Encounter (Signed)
I called the patient and left a message on his voicemail yesterday explaining the results

## 2015-07-17 ENCOUNTER — Encounter: Payer: Self-pay | Admitting: Medical

## 2015-07-20 DIAGNOSIS — Z0271 Encounter for disability determination: Secondary | ICD-10-CM

## 2015-07-22 ENCOUNTER — Encounter: Payer: Self-pay | Admitting: Neurology

## 2015-07-22 ENCOUNTER — Ambulatory Visit (INDEPENDENT_AMBULATORY_CARE_PROVIDER_SITE_OTHER): Payer: Medicaid Other | Admitting: Neurology

## 2015-07-22 VITALS — BP 131/88 | HR 80 | Ht 72.0 in | Wt 269.2 lb

## 2015-07-22 DIAGNOSIS — R569 Unspecified convulsions: Secondary | ICD-10-CM | POA: Diagnosis not present

## 2015-07-22 NOTE — Patient Instructions (Signed)
I had a long d/w patient about his remote stroke, risk for recurrent stroke/TIAs, personally independently reviewed imaging studies and stroke evaluation results and answered questions.Continue aspirin 325 mg orally every day  for secondary stroke prevention and maintain strict control of hypertension with blood pressure goal below 130/90, diabetes with hemoglobin A1c goal below 6.5% and lipids with LDL cholesterol goal below 100 mg/dL. I also advised the patient to eat a healthy diet with plenty of whole grains, cereals, fruits and vegetables, exercise regularly and maintain ideal body weight. He was counseled to be compliant with his Keppra and to take it twice daily and not once daily as he was doing for his seizures. Greater than 50% of time during this 25 minute visit was spent on counseling and coordination of care. Followup in the future with Ward Givens, NP in 6 months or call earlier if necessary
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
## 2015-07-24 NOTE — Telephone Encounter (Signed)
Patient was seen in the office on July 22 2015. See progress notes.

## 2015-07-29 ENCOUNTER — Telehealth: Payer: Self-pay | Admitting: Medical

## 2015-07-29 NOTE — Telephone Encounter (Signed)
Please set up home health eval for him.   He has hx/o stroke, ataxia, problems with ADLs.

## 2015-07-29 NOTE — Telephone Encounter (Signed)
[facility]axed over [facility] to [facility] @ [phone] to get an evaluation for pt needs

## 2015-07-30 NOTE — Telephone Encounter (Signed)
Brandon [name] from Home health care called stating that due to staffing that they are not doing any [name] visits at this time. I have sent another referral to [facility] @ [phone]. Phone # [phone]

## 2015-08-04 ENCOUNTER — Ambulatory Visit: Payer: Medicaid Other | Admitting: Neurology

## 2015-08-06 ENCOUNTER — Telehealth: Payer: Self-pay

## 2015-08-07 ENCOUNTER — Ambulatory Visit: Payer: Self-pay | Admitting: Medical

## 2015-08-12 NOTE — Telephone Encounter (Signed)
error 

## 2015-09-11 ENCOUNTER — Other Ambulatory Visit: Payer: Self-pay | Admitting: Medical

## 2015-09-11 NOTE — Telephone Encounter (Signed)
Is this okay to refill? 

## 2015-09-29 ENCOUNTER — Encounter: Payer: Self-pay | Admitting: Radiology

## 2015-09-29 ENCOUNTER — Other Ambulatory Visit: Payer: Self-pay | Admitting: Radiology

## 2015-09-29 ENCOUNTER — Other Ambulatory Visit (HOSPITAL_COMMUNITY): Payer: Self-pay | Admitting: Interventional Radiology

## 2015-09-29 DIAGNOSIS — C641 Malignant neoplasm of right kidney, except renal pelvis: Secondary | ICD-10-CM

## 2015-09-29 DIAGNOSIS — N2889 Other specified disorders of kidney and ureter: Secondary | ICD-10-CM

## 2015-09-29 DIAGNOSIS — C642 Malignant neoplasm of left kidney, except renal pelvis: Secondary | ICD-10-CM

## 2015-10-19 ENCOUNTER — Ambulatory Visit (INDEPENDENT_AMBULATORY_CARE_PROVIDER_SITE_OTHER): Payer: Medicaid Other | Admitting: Medical

## 2015-10-19 ENCOUNTER — Encounter: Payer: Self-pay | Admitting: Medical

## 2015-10-19 VITALS — BP 140/84 | HR 92 | Wt 274.0 lb

## 2015-10-19 DIAGNOSIS — E118 Type 2 diabetes mellitus with unspecified complications: Secondary | ICD-10-CM

## 2015-10-19 DIAGNOSIS — Z794 Long term (current) use of insulin: Secondary | ICD-10-CM

## 2015-10-19 DIAGNOSIS — I639 Cerebral infarction, unspecified: Secondary | ICD-10-CM

## 2015-10-19 DIAGNOSIS — N452 Orchitis: Secondary | ICD-10-CM | POA: Diagnosis not present

## 2015-10-19 DIAGNOSIS — Z8673 Personal history of transient ischemic attack (TIA), and cerebral infarction without residual deficits: Secondary | ICD-10-CM | POA: Diagnosis not present

## 2015-10-19 DIAGNOSIS — I10 Essential (primary) hypertension: Secondary | ICD-10-CM | POA: Diagnosis not present

## 2015-10-19 DIAGNOSIS — T148 Other injury of unspecified body region: Secondary | ICD-10-CM

## 2015-10-19 DIAGNOSIS — F0631 Mood disorder due to known physiological condition with depressive features: Secondary | ICD-10-CM

## 2015-10-19 DIAGNOSIS — E785 Hyperlipidemia, unspecified: Secondary | ICD-10-CM

## 2015-10-19 DIAGNOSIS — I69322 Dysarthria following cerebral infarction: Secondary | ICD-10-CM

## 2015-10-19 DIAGNOSIS — Z23 Encounter for immunization: Secondary | ICD-10-CM | POA: Diagnosis not present

## 2015-10-19 DIAGNOSIS — IMO0002 Reserved for concepts with insufficient information to code with codable children: Secondary | ICD-10-CM

## 2015-10-19 DIAGNOSIS — T148XXA Other injury of unspecified body region, initial encounter: Secondary | ICD-10-CM

## 2015-10-19 DIAGNOSIS — G894 Chronic pain syndrome: Secondary | ICD-10-CM

## 2015-10-19 LAB — POCT URINALYSIS DIPSTICK
BILIRUBIN UA: NEGATIVE
GLUCOSE UA: 2
Ketones, UA: NEGATIVE
Leukocytes, UA: NEGATIVE
Nitrite, UA: NEGATIVE
SPEC GRAV UA: 1.025
UROBILINOGEN UA: 0.2
pH, UA: 6

## 2015-10-19 LAB — COMPREHENSIVE METABOLIC PANEL
ALBUMIN: 4.1 g/dL (ref 3.6–5.1)
ALT: 26 U/L (ref 9–46)
AST: 22 U/L (ref 10–35)
Alkaline Phosphatase: 108 U/L (ref 40–115)
BUN: 16 mg/dL (ref 7–25)
CALCIUM: 9.3 mg/dL (ref 8.6–10.3)
CHLORIDE: 98 mmol/L (ref 98–110)
CO2: 27 mmol/L (ref 20–31)
Creat: 0.96 mg/dL (ref 0.70–1.33)
Glucose, Bld: 292 mg/dL — ABNORMAL HIGH (ref 65–99)
Potassium: 3.6 mmol/L (ref 3.5–5.3)
Sodium: 136 mmol/L (ref 135–146)
Total Bilirubin: 1.1 mg/dL (ref 0.2–1.2)
Total Protein: 7.2 g/dL (ref 6.1–8.1)

## 2015-10-19 LAB — LIPID PANEL
Cholesterol: 118 mg/dL — ABNORMAL LOW (ref 125–200)
HDL: 43 mg/dL (ref >=40)
LDL Cholesterol: 53 mg/dL (ref <130)
TRIGLYCERIDES: 112 mg/dL (ref <150)
Total CHOL/HDL Ratio: 2.7 Ratio (ref <=5.0)
VLDL: 22 mg/dL (ref <30)

## 2015-10-19 MED ORDER — CAPTOPRIL-HYDROCHLOROTHIAZIDE 25-15 MG PO TABS: 1.0000 | ORAL_TABLET | Freq: Every day | ORAL | Status: DC

## 2015-10-19 MED ORDER — CIPROFLOXACIN HCL 500 MG PO TABS: 500.0000 mg | ORAL_TABLET | Freq: Two times a day (BID) | ORAL | Status: DC

## 2015-10-19 MED ORDER — INSULIN NPH ISOPHANE & REGULAR (70-30) 100 UNIT/ML ~~LOC~~ SUSP: SUBCUTANEOUS | Status: DC

## 2015-10-19 MED ORDER — DULOXETINE HCL 60 MG PO CPEP: 60.0000 mg | ORAL_CAPSULE | Freq: Two times a day (BID) | ORAL | Status: DC

## 2015-10-19 MED ORDER — METHOCARBAMOL 500 MG PO TABS: 500.0000 mg | ORAL_TABLET | Freq: Three times a day (TID) | ORAL | Status: DC | PRN

## 2015-10-19 MED ORDER — TRAMADOL HCL 50 MG PO TABS: 50.0000 mg | ORAL_TABLET | Freq: Three times a day (TID) | ORAL | Status: DC | PRN
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
## 2015-10-19 NOTE — Addendum Note (Signed)
Addended by: Billie Lade on: 10/19/2015 03:44 PM   Modules accepted: Orders

## 2015-10-20 ENCOUNTER — Other Ambulatory Visit: Payer: Self-pay | Admitting: Medical

## 2015-10-20 ENCOUNTER — Telehealth: Payer: Self-pay

## 2015-10-20 DIAGNOSIS — T148XXA Other injury of unspecified body region, initial encounter: Secondary | ICD-10-CM

## 2015-10-20 LAB — HEMOGLOBIN A1C
Hgb A1c MFr Bld: 10.3 % — ABNORMAL HIGH (ref <5.7)
Mean Plasma Glucose: 249 mg/dL — ABNORMAL HIGH (ref <117)

## 2015-10-20 LAB — CBC
HCT: 38 % — ABNORMAL LOW (ref 39.0–52.0)
Hemoglobin: 13.3 g/dL (ref 13.0–17.0)
MCH: 30.5 pg (ref 26.0–34.0)
MCHC: 35 g/dL (ref 30.0–36.0)
MCV: 87.2 fL (ref 78.0–100.0)
MPV: 11.3 fL (ref 8.6–12.4)
PLATELETS: 229 10*3/uL (ref 150–400)
RBC: 4.36 MIL/uL (ref 4.22–5.81)
RDW: 12.9 % (ref 11.5–15.5)
WBC: 6 10*3/uL (ref 4.0–10.5)

## 2015-10-20 MED ORDER — INSULIN NPH ISOPHANE & REGULAR (70-30) 100 UNIT/ML ~~LOC~~ SUSP: SUBCUTANEOUS | Status: DC

## 2015-10-20 MED ORDER — CANAGLIFLOZIN-METFORMIN HCL 50-1000 MG PO TABS: 1.0000 | ORAL_TABLET | Freq: Two times a day (BID) | ORAL | Status: DC

## 2015-10-20 NOTE — Telephone Encounter (Signed)
Referred to [facility]

## 2015-10-21 ENCOUNTER — Telehealth: Payer: Self-pay | Admitting: Medical

## 2015-10-21 LAB — URINE CULTURE
COLONY COUNT: NO GROWTH
ORGANISM ID, BACTERIA: NO GROWTH

## 2015-10-21 NOTE — Telephone Encounter (Signed)
Recv'd fax from [facility] regarding Invokamet, states insurance coverage is terminated, Left message for pt

## 2015-10-22 ENCOUNTER — Encounter: Payer: Self-pay | Admitting: Family Medicine

## 2015-10-22 ENCOUNTER — Telehealth: Payer: Self-pay | Admitting: Family Medicine

## 2015-10-22 NOTE — Telephone Encounter (Signed)
Pt called requesting letter stating he has been under our care since he had stroke and cancer.  He also wants the letter to state we have referred him for neck pain.  Letter done.

## 2015-11-09 ENCOUNTER — Telehealth: Payer: Self-pay | Admitting: *Deleted
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
## 2015-11-11 ENCOUNTER — Other Ambulatory Visit: Payer: Medicaid Other

## 2015-11-11 ENCOUNTER — Other Ambulatory Visit (HOSPITAL_COMMUNITY): Payer: Medicaid Other

## 2015-11-23 ENCOUNTER — Other Ambulatory Visit: Payer: Medicaid Other

## 2015-12-01 ENCOUNTER — Telehealth: Payer: Self-pay

## 2015-12-01 NOTE — Telephone Encounter (Signed)
Pt has appt with [facility] on 12/02/15 at 8:30 am. Left vm for pt about appt incase he was not aware

## 2015-12-24 ENCOUNTER — Other Ambulatory Visit: Payer: Medicaid Other

## 2015-12-24 ENCOUNTER — Ambulatory Visit (HOSPITAL_COMMUNITY): Admission: RE | Admit: 2015-12-24 | Payer: Medicaid Other | Source: Ambulatory Visit

## 2015-12-28 ENCOUNTER — Ambulatory Visit (HOSPITAL_COMMUNITY): Admission: RE | Admit: 2015-12-28 | Payer: Medicaid Other | Source: Ambulatory Visit

## 2015-12-31 ENCOUNTER — Telehealth: Payer: Self-pay | Admitting: Medical

## 2015-12-31 NOTE — Telephone Encounter (Signed)
Received medical records request signed by the pt. Records to be sent to [facility] [place], [place] Fax number [phone]. Records sent

## 2016-01-01 ENCOUNTER — Encounter (HOSPITAL_COMMUNITY): Payer: Self-pay

## 2016-01-01 ENCOUNTER — Ambulatory Visit (HOSPITAL_COMMUNITY)
Admission: RE | Admit: 2016-01-01 | Discharge: 2016-01-01 | Disposition: A | Discharge status: Home / Self Care | Payer: Medicaid Other | Source: Ambulatory Visit | Attending: Interventional Radiology | Admitting: Interventional Radiology

## 2016-01-01 DIAGNOSIS — N2889 Other specified disorders of kidney and ureter: Secondary | ICD-10-CM | POA: Insufficient documentation

## 2016-01-01 DIAGNOSIS — Z0279 Encounter for issue of other medical certificate: Secondary | ICD-10-CM

## 2016-01-01 DIAGNOSIS — R911 Solitary pulmonary nodule: Secondary | ICD-10-CM | POA: Diagnosis not present

## 2016-01-01 LAB — POCT I-STAT CREATININE: CREATININE: 0.9 mg/dL (ref 0.61–1.24)

## 2016-01-01 MED ORDER — IOHEXOL 350 MG/ML SOLN
100.0000 mL | Freq: Once | INTRAVENOUS | Status: AC | PRN
  Administered 2016-01-01: 100 mL via INTRAVENOUS

## 2016-01-05 ENCOUNTER — Telehealth: Payer: Self-pay | Admitting: Medical

## 2016-01-05 ENCOUNTER — Ambulatory Visit (INDEPENDENT_AMBULATORY_CARE_PROVIDER_SITE_OTHER): Payer: Medicaid Other | Admitting: Medical

## 2016-01-05 ENCOUNTER — Encounter: Payer: Self-pay | Admitting: Medical

## 2016-01-05 VITALS — BP 168/110 | HR 81 | Wt 280.0 lb

## 2016-01-05 DIAGNOSIS — I639 Cerebral infarction, unspecified: Secondary | ICD-10-CM

## 2016-01-05 DIAGNOSIS — I69322 Dysarthria following cerebral infarction: Secondary | ICD-10-CM | POA: Diagnosis not present

## 2016-01-05 DIAGNOSIS — F0631 Mood disorder due to known physiological condition with depressive features: Secondary | ICD-10-CM | POA: Diagnosis not present

## 2016-01-05 DIAGNOSIS — Z794 Long term (current) use of insulin: Secondary | ICD-10-CM

## 2016-01-05 DIAGNOSIS — Z8669 Personal history of other diseases of the nervous system and sense organs: Secondary | ICD-10-CM

## 2016-01-05 DIAGNOSIS — E118 Type 2 diabetes mellitus with unspecified complications: Secondary | ICD-10-CM | POA: Diagnosis not present

## 2016-01-05 DIAGNOSIS — Z87898 Personal history of other specified conditions: Secondary | ICD-10-CM

## 2016-01-05 DIAGNOSIS — G894 Chronic pain syndrome: Secondary | ICD-10-CM

## 2016-01-05 DIAGNOSIS — IMO0002 Reserved for concepts with insufficient information to code with codable children: Secondary | ICD-10-CM

## 2016-01-05 DIAGNOSIS — I10 Essential (primary) hypertension: Secondary | ICD-10-CM

## 2016-01-05 DIAGNOSIS — Z9119 Patient's noncompliance with other medical treatment and regimen: Secondary | ICD-10-CM

## 2016-01-05 DIAGNOSIS — Z91199 Patient's noncompliance with other medical treatment and regimen due to unspecified reason: Secondary | ICD-10-CM

## 2016-01-05 DIAGNOSIS — E1110 Type 2 diabetes mellitus with ketoacidosis without coma: Secondary | ICD-10-CM | POA: Insufficient documentation

## 2016-01-05 DIAGNOSIS — Z8673 Personal history of transient ischemic attack (TIA), and cerebral infarction without residual deficits: Secondary | ICD-10-CM | POA: Diagnosis not present

## 2016-01-05 MED ORDER — CAPTOPRIL-HYDROCHLOROTHIAZIDE 50-15 MG PO TABS: 1.0000 | ORAL_TABLET | Freq: Every day | ORAL | Status: DC

## 2016-01-05 MED ORDER — LEVETIRACETAM 500 MG PO TABS: 500.0000 mg | ORAL_TABLET | Freq: Two times a day (BID) | ORAL | Status: DC

## 2016-01-05 MED ORDER — DULOXETINE HCL 60 MG PO CPEP: 60.0000 mg | ORAL_CAPSULE | Freq: Two times a day (BID) | ORAL | Status: DC

## 2016-01-05 MED ORDER — ATORVASTATIN CALCIUM 40 MG PO TABS: 40.0000 mg | ORAL_TABLET | Freq: Every day | ORAL | Status: DC

## 2016-01-05 MED ORDER — CANAGLIFLOZIN-METFORMIN HCL 50-1000 MG PO TABS: 1.0000 | ORAL_TABLET | Freq: Two times a day (BID) | ORAL | Status: DC

## 2016-01-05 MED ORDER — INSULIN ASPART PROT & ASPART (70-30 MIX) 100 UNIT/ML PEN: 45.0000 [IU] | PEN_INJECTOR | Freq: Two times a day (BID) | SUBCUTANEOUS | Status: DC
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
## 2016-01-05 NOTE — Telephone Encounter (Signed)
Call him and verify with him and wife too recommendations today.   although we called last time, he said he never recalled [place] telling him to start Invokamet from last visit although we documented otherwise  Please call about the following:  Change to Novolog 70/30 pen insulin, 45 units BID, increased from 40 units BID BEGIN Invokamet tablet BID for diabetes I increased his Capozide BP medication dose, but this is once daily C/t aspirin and Lipitor daily C/t Cymbalta RESTART Keppra BID for seizure prevention  Establish with psychiatry ASAP!  F/u here 71mo fasting.

## 2016-01-05 NOTE — Telephone Encounter (Signed)
LMTCB but pt does not have a wife and his "significant other" is not on his HIPAA

## 2016-01-05 NOTE — Telephone Encounter (Signed)
Ok, then just go over the details with him,have him write them down

## 2016-01-05 NOTE — Telephone Encounter (Signed)
Received from attorney Grayling Congress Zwerling to have records previously sent notarized for custodian of records certification. I faxed to [phone] and original mailed to [address] [place] [address].

## 2016-01-05 NOTE — Patient Instructions (Signed)
You can call to schedule your appointment with the psychiatrist. A few offices are listed below for you to call.   [facility]  [place], [place], [place] [address]  Phone: [phone]  [place] [address] [place], [place] [address]  Phone: [place], [place], [place], [place], [place] [address] Phone: [phone]

## 2016-01-06 NOTE — Telephone Encounter (Signed)
He has a print out with two of those options on it and will call back if he has anymore problems

## 2016-01-06 NOTE — Telephone Encounter (Signed)
LMTCB

## 2016-01-06 NOTE — Telephone Encounter (Signed)
Pt is aware and stated [facility] and they do not accept medicaid will call the others later on. Pt also stated his invokamet was not ready yet he would pick it up tomorrow

## 2016-01-06 NOTE — Telephone Encounter (Signed)
[facility] down town should be able to take him  [facility] is an option  [facility] another option

## 2016-01-08 ENCOUNTER — Encounter (HOSPITAL_COMMUNITY): Payer: Self-pay | Admitting: Emergency Medicine

## 2016-01-08 ENCOUNTER — Emergency Department (HOSPITAL_COMMUNITY): Payer: Medicaid Other

## 2016-01-08 ENCOUNTER — Emergency Department (HOSPITAL_COMMUNITY)
Admission: EM | Admit: 2016-01-08 | Discharge: 2016-01-08 | Disposition: A | Discharge status: Home / Self Care | Payer: Medicaid Other | Attending: Emergency Medicine | Admitting: Emergency Medicine

## 2016-01-08 DIAGNOSIS — Z79899 Other long term (current) drug therapy: Secondary | ICD-10-CM | POA: Insufficient documentation

## 2016-01-08 DIAGNOSIS — R531 Weakness: Secondary | ICD-10-CM | POA: Diagnosis present

## 2016-01-08 DIAGNOSIS — F419 Anxiety disorder, unspecified: Secondary | ICD-10-CM | POA: Insufficient documentation

## 2016-01-08 DIAGNOSIS — Z794 Long term (current) use of insulin: Secondary | ICD-10-CM | POA: Diagnosis not present

## 2016-01-08 DIAGNOSIS — M6289 Other specified disorders of muscle: Secondary | ICD-10-CM

## 2016-01-08 DIAGNOSIS — Z8673 Personal history of transient ischemic attack (TIA), and cerebral infarction without residual deficits: Secondary | ICD-10-CM | POA: Insufficient documentation

## 2016-01-08 DIAGNOSIS — Z7982 Long term (current) use of aspirin: Secondary | ICD-10-CM | POA: Insufficient documentation

## 2016-01-08 DIAGNOSIS — R4701 Aphasia: Secondary | ICD-10-CM | POA: Insufficient documentation

## 2016-01-08 DIAGNOSIS — I639 Cerebral infarction, unspecified: Secondary | ICD-10-CM

## 2016-01-08 DIAGNOSIS — I10 Essential (primary) hypertension: Secondary | ICD-10-CM | POA: Diagnosis not present

## 2016-01-08 DIAGNOSIS — G43109 Migraine with aura, not intractable, without status migrainosus: Secondary | ICD-10-CM | POA: Diagnosis not present

## 2016-01-08 LAB — I-STAT TROPONIN, ED: TROPONIN I, POC: 0.01 ng/mL (ref 0.00–0.08)

## 2016-01-08 LAB — URINALYSIS, ROUTINE W REFLEX MICROSCOPIC
BILIRUBIN URINE: NEGATIVE
Glucose, UA: NEGATIVE mg/dL
Ketones, ur: NEGATIVE mg/dL
LEUKOCYTES UA: NEGATIVE
NITRITE: NEGATIVE
Protein, ur: NEGATIVE mg/dL
SPECIFIC GRAVITY, URINE: 1.022 (ref 1.005–1.030)
pH: 5.5 (ref 5.0–8.0)

## 2016-01-08 LAB — ETHANOL

## 2016-01-08 LAB — CBC
HCT: 39.5 % (ref 39.0–52.0)
Hemoglobin: 13.9 g/dL (ref 13.0–17.0)
MCH: 30.7 pg (ref 26.0–34.0)
MCHC: 35.2 g/dL (ref 30.0–36.0)
MCV: 87.2 fL (ref 78.0–100.0)
PLATELETS: 223 10*3/uL (ref 150–400)
RBC: 4.53 MIL/uL (ref 4.22–5.81)
RDW: 11.7 % (ref 11.5–15.5)
WBC: 5.7 10*3/uL (ref 4.0–10.5)

## 2016-01-08 LAB — DIFFERENTIAL
BASOS ABS: 0 10*3/uL (ref 0.0–0.1)
BASOS PCT: 0 %
EOS ABS: 0.1 10*3/uL (ref 0.0–0.7)
Eosinophils Relative: 1 %
Lymphocytes Relative: 42 %
Lymphs Abs: 2.4 10*3/uL (ref 0.7–4.0)
MONO ABS: 0.4 10*3/uL (ref 0.1–1.0)
Monocytes Relative: 7 %
NEUTROS ABS: 2.8 10*3/uL (ref 1.7–7.7)
Neutrophils Relative %: 50 %

## 2016-01-08 LAB — I-STAT CHEM 8, ED
BUN: 20 mg/dL (ref 6–20)
Calcium, Ion: 1.22 mmol/L (ref 1.12–1.23)
Chloride: 103 mmol/L (ref 101–111)
Creatinine, Ser: 0.9 mg/dL (ref 0.61–1.24)
Glucose, Bld: 57 mg/dL — ABNORMAL LOW (ref 65–99)
HCT: 43 % (ref 39.0–52.0)
Hemoglobin: 14.6 g/dL (ref 13.0–17.0)
Potassium: 3.7 mmol/L (ref 3.5–5.1)
Sodium: 143 mmol/L (ref 135–145)
TCO2: 26 mmol/L (ref 0–100)

## 2016-01-08 LAB — COMPREHENSIVE METABOLIC PANEL
ALT: 22 U/L (ref 17–63)
ANION GAP: 11 (ref 5–15)
AST: 23 U/L (ref 15–41)
Albumin: 4 g/dL (ref 3.5–5.0)
Alkaline Phosphatase: 117 U/L (ref 38–126)
BUN: 18 mg/dL (ref 6–20)
CALCIUM: 9.6 mg/dL (ref 8.9–10.3)
CO2: 26 mmol/L (ref 22–32)
CREATININE: 0.99 mg/dL (ref 0.61–1.24)
Chloride: 105 mmol/L (ref 101–111)
Glucose, Bld: 59 mg/dL — ABNORMAL LOW (ref 65–99)
Potassium: 3.6 mmol/L (ref 3.5–5.1)
SODIUM: 142 mmol/L (ref 135–145)
TOTAL PROTEIN: 7.4 g/dL (ref 6.5–8.1)
Total Bilirubin: 0.5 mg/dL (ref 0.3–1.2)

## 2016-01-08 LAB — URINE MICROSCOPIC-ADD ON

## 2016-01-08 LAB — APTT: APTT: 32 s (ref 24–37)

## 2016-01-08 LAB — RAPID URINE DRUG SCREEN, HOSP PERFORMED
AMPHETAMINES: NOT DETECTED
Barbiturates: NOT DETECTED
Benzodiazepines: NOT DETECTED
Cocaine: NOT DETECTED
OPIATES: NOT DETECTED
Tetrahydrocannabinol: NOT DETECTED

## 2016-01-08 LAB — CBG MONITORING, ED: Glucose-Capillary: 79 mg/dL (ref 65–99)

## 2016-01-08 LAB — PROTIME-INR
INR: 1.17 (ref 0.00–1.49)
Prothrombin Time: 15.1 s (ref 11.6–15.2)

## 2016-01-08 LAB — I-STAT CREATININE, ED: CREATININE: 0.9 mg/dL (ref 0.61–1.24)

## 2016-01-08 MED ORDER — IOHEXOL 350 MG/ML SOLN
50.0000 mL | Freq: Once | INTRAVENOUS | Status: AC | PRN
  Administered 2016-01-08: 50 mL via INTRAVENOUS

## 2016-01-08 MED ORDER — METOCLOPRAMIDE HCL 5 MG/ML IJ SOLN
10.0000 mg | Freq: Once | INTRAMUSCULAR | Status: AC
  Administered 2016-01-08: 10 mg via INTRAVENOUS
  Filled 2016-01-08: qty 2

## 2016-01-08 MED ORDER — DIPHENHYDRAMINE HCL 50 MG/ML IJ SOLN
12.5000 mg | Freq: Once | INTRAMUSCULAR | Status: AC
  Administered 2016-01-08: 12.5 mg via INTRAVENOUS
  Filled 2016-01-08: qty 1

## 2016-01-08 NOTE — ED Provider Notes (Signed)
Handoff received from Dr. Darl Householder at 1700 with plan for f/u MR and reassess after migraine cocktail.   Results:  BP 134/86 mmHg  Pulse 79  Temp(Src) 98.1 F (36.7 C)  Resp 10  SpO2 100%  Results for orders placed or performed during the hospital encounter of 01/08/16  Ethanol  Result Value Ref Range   Alcohol, Ethyl (B) <5 <5 mg/dL  Protime-INR  Result Value Ref Range   Prothrombin Time 15.1 11.6 - 15.2 seconds   INR 1.17 0.00 - 1.49  APTT  Result Value Ref Range   aPTT 32 24 - 37 seconds  CBC  Result Value Ref Range   WBC 5.7 4.0 - 10.5 K/uL   RBC 4.53 4.22 - 5.81 MIL/uL   Hemoglobin 13.9 13.0 - 17.0 g/dL   HCT 39.5 39.0 - 52.0 %   MCV 87.2 78.0 - 100.0 fL   MCH 30.7 26.0 - 34.0 pg   MCHC 35.2 30.0 - 36.0 g/dL   RDW 11.7 11.5 - 15.5 %   Platelets 223 150 - 400 K/uL  Differential  Result Value Ref Range   Neutrophils Relative % 50 %   Neutro Abs 2.8 1.7 - 7.7 K/uL   Lymphocytes Relative 42 %   Lymphs Abs 2.4 0.7 - 4.0 K/uL   Monocytes Relative 7 %   Monocytes Absolute 0.4 0.1 - 1.0 K/uL   Eosinophils Relative 1 %   Eosinophils Absolute 0.1 0.0 - 0.7 K/uL   Basophils Relative 0 %   Basophils Absolute 0.0 0.0 - 0.1 K/uL  Comprehensive metabolic panel  Result Value Ref Range   Sodium 142 135 - 145 mmol/L   Potassium 3.6 3.5 - 5.1 mmol/L   Chloride 105 101 - 111 mmol/L   CO2 26 22 - 32 mmol/L   Glucose, Bld 59 (L) 65 - 99 mg/dL   BUN 18 6 - 20 mg/dL   Creatinine, Ser 0.99 0.61 - 1.24 mg/dL   Calcium 9.6 8.9 - 10.3 mg/dL   Total Protein 7.4 6.5 - 8.1 g/dL   Albumin 4.0 3.5 - 5.0 g/dL   AST 23 15 - 41 U/L   ALT 22 17 - 63 U/L   Alkaline Phosphatase 117 38 - 126 U/L   Total Bilirubin 0.5 0.3 - 1.2 mg/dL   GFR calc non Af Amer >60 >60 mL/min   GFR calc Af Amer >60 >60 mL/min   Anion gap 11 5 - 15  Urine rapid drug screen (hosp performed)not at [facility]  Result Value Ref Range   Opiates NONE DETECTED NONE DETECTED   Cocaine NONE DETECTED NONE DETECTED    Benzodiazepines NONE DETECTED NONE DETECTED   Amphetamines NONE DETECTED NONE DETECTED   Tetrahydrocannabinol NONE DETECTED NONE DETECTED   Barbiturates NONE DETECTED NONE DETECTED  Urinalysis, Routine w reflex microscopic (not at [facility])  Result Value Ref Range   Color, Urine YELLOW YELLOW   APPearance CLEAR CLEAR   Specific Gravity, Urine 1.022 1.005 - 1.030   pH 5.5 5.0 - 8.0   Glucose, UA NEGATIVE NEGATIVE mg/dL   Hgb urine dipstick SMALL (A) NEGATIVE   Bilirubin Urine NEGATIVE NEGATIVE   Ketones, ur NEGATIVE NEGATIVE mg/dL   Protein, ur NEGATIVE NEGATIVE mg/dL   Nitrite NEGATIVE NEGATIVE   Leukocytes, UA NEGATIVE NEGATIVE  Urine microscopic-add on  Result Value Ref Range   Squamous Epithelial / LPF 0-5 (A) NONE SEEN   WBC, UA 0-5 0 - 5 WBC/hpf   RBC / HPF 0-5 0 -  5 RBC/hpf   Bacteria, UA RARE (A) NONE SEEN  I-Stat Chem 8, ED  (not at [facility], [facility])  Result Value Ref Range   Sodium 143 135 - 145 mmol/L   Potassium 3.7 3.5 - 5.1 mmol/L   Chloride 103 101 - 111 mmol/L   BUN 20 6 - 20 mg/dL   Creatinine, Ser 0.90 0.61 - 1.24 mg/dL   Glucose, Bld 57 (L) 65 - 99 mg/dL   Calcium, Ion 1.22 1.12 - 1.23 mmol/L   TCO2 26 0 - 100 mmol/L   Hemoglobin 14.6 13.0 - 17.0 g/dL   HCT 43.0 39.0 - 52.0 %  I-stat troponin, ED (not at [facility], [facility])  Result Value Ref Range   Troponin i, poc 0.01 0.00 - 0.08 ng/mL   Comment 3          I-stat Creatinine, ED  Result Value Ref Range   Creatinine, Ser 0.90 0.61 - 1.24 mg/dL  CBG monitoring, ED  Result Value Ref Range   Glucose-Capillary 79 65 - 99 mg/dL    Ct Head Wo Contrast  01/08/2016  CLINICAL DATA:  Code stroke EXAM: CT HEAD WITHOUT CONTRAST TECHNIQUE: Contiguous axial images were obtained from the base of the skull through the vertex without contrast. COMPARISON:  12/13/2014 FINDINGS: Encephalomalacia in the right parietal and temporal lobes with evidence of cortical hyperdense calcification from a remote right MCA territory infarct. No definite  acute intracranial hemorrhage, mass lesion, new infarction, midline shift, herniation, hydrocephalus, or extra-axial fluid collection. Normal gray-white matter differentiation. Cisterns are patent. No cerebellar abnormality. Orbits are symmetric. No acute osseous finding. Mastoids and sinuses remain clear. IMPRESSION: Remote right parietal temporal infarct with encephalomalacia. No acute finding or interval change by noncontrast CT. Electronically Signed   By: Jerilynn Mages.  Shick M.D.   On: 01/08/2016 12:41   Mr Brain Wo Contrast  01/08/2016  CLINICAL DATA:  55 year old male with worsening left side weakness for 1 week with headaches and a new tremor. Previous right brain stroke. Initial encounter. EXAM: MRI HEAD WITHOUT CONTRAST TECHNIQUE: Multiplanar, multiecho pulse sequences of the brain and surrounding structures were obtained without intravenous contrast. COMPARISON:  Head CT without contrast 1230 hours today. Brain MRI 07/13/2015, 11/17/2013. FINDINGS: Major intracranial vascular flow voids are stable and within normal limits. No restricted diffusion to suggest acute infarction. No midline shift, mass effect, evidence of mass lesion, ventriculomegaly, extra-axial collection or acute intracranial hemorrhage. Cervicomedullary junction and pituitary are within normal limits. Negative visualized cervical spine. Chronic encephalomalacia posterior right MCA territory with laminar necrosis. The right temporal lobe and parietal lobe primarily are affected. Ex vacuo enlargement of the right atrium and temporal horn. Scattered mostly subcortical small cerebral white matter foci of nonspecific T2 and FLAIR hyperintensity elsewhere appear stable since 2014. No other cortical encephalomalacia. Deep gray matter nuclei and cerebellum within normal limits. There is Wallerian degeneration evident in the right pons. Visible internal auditory structures appear normal. Chronic right peach wrists apex air cell fluid likely  postinflammatory. Mastoids are clear. Paranasal sinuses are clear. Stable and negative orbit and scalp soft tissues. Normal bone marrow signal. IMPRESSION: 1.  No acute intracranial abnormality. 2. 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 Comparatively mild nonspecific cerebral white matter signal changes elsewhere are stable since 2014. Electronically Signed   By: Genevie Ann M.D.   On: 01/08/2016 16:43    Radiology and laboratory examinations were reviewed by me and used in medical decision making if performed.   Diagnoses  that have been ruled out:  None  Diagnoses that are still under consideration:  None  Final diagnoses:  Complicated migraine    MDM:  MR negative, Pt feeling better after migraine cocktail. Informed of results and discussed f/u. Plan to follow up with PCP as needed and return precautions discussed for worsening or new concerning symptoms.    Leo Grosser, MD 01/09/16 [phone]

## 2016-01-08 NOTE — ED Notes (Signed)
Pt here from home with unknown lsn. Pt presents with slurred speech and stuttering with weakness on left side and right facial droop. Pt reports ha.

## 2016-01-08 NOTE — Consult Note (Signed)
Neurology Consultation Reason for Consult: Difficulty speaking Referring Physician: Darl Householder, D  CC: Difficulty speaking  History is obtained from: Patient  HPI: Brandon [name] is a 55 y.o. male who was last in his normal state of health prior to falling back asleep around 7:30 AM. He then presented to the emergency room and a code stroke was called. He was complaining of left-sided weakness, and difficulty speaking. On evaluation, his NIH stroke scale was 6. He was also of the IV TPA window and he did not meet criteria for IA therapy. He is complaining of a severe left-sided headache as well as phonophobia.   LKW: 7:30 AM tpa given?: no, outside of window    ROS: A 14 point ROS was performed and is negative except as noted in the HPI.  Past Medical History  Diagnosis Date  . Hypertension   . Diverticulitis     s/p micorperforation Sept 2012-managed conservatively by Gen surgery  . Kidney tumor 09/2011    Renal cell CA  . Wears glasses   . ED (erectile dysfunction)   . 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 Acute ischemic stroke ([place]) 11/2013  . Focal seizure ([place]) 11/2013    due to ischemic stroke  . Diabetes mellitus     DKA prior hospitalization  . Depression     BH Adm in [place] Depression     Family History  Problem Relation Age of Onset  . Diabetes Father      Social History:  reports that he has never smoked. He has never used smokeless tobacco. He reports that he does not drink alcohol or use illicit drugs.   Exam: Current vital signs: BP 124/80 mmHg  Pulse 65  Temp(Src) 98.1 F (36.7 C)  Resp 18  SpO2 99% Vital signs in last 24 hours: Temp:  [98.1 F (36.7 C)] 98.1 F (36.7 C) (02/03 1314) Pulse Rate:  [56-79] 65 (02/03 1715) Resp:  [10-21] 18 (02/03 1715) BP: (124-173)/(80-117) 124/80 mmHg (02/03 1715) SpO2:  [98 %-100 %] 99 % (02/03 1715)   Physical Exam  Constitutional: Appears well-developed and  well-nourished.  Psych: Affect appropriate to situation Eyes: No scleral injection HENT: No OP obstrucion Head: Normocephalic.  Cardiovascular: Normal rate and regular rhythm.  Respiratory: Effort normal and breath sounds normal to anterior ascultation GI: Soft.  No distension. There is no tenderness.  Skin: WDI  Neuro: Mental Status: Patient is awake, alert, oriented to person, place, month, year, and situation. He does not have any clear aphasia, but does have severe dysarthria. Character of the dysarthria is very unusual, and is coached in ways of making the sounds (e.g. he would not put the tongue at the top of his mouth for T-sound) he then became able to perform this. Cranial Nerves: II: Visual Fields are full. Pupils are equal, round, and reactive to light.   III,IV, VI: EOMI without ptosis or diploplia.  V: Facial sensation is symmetric to temperature VII: Facial movement is symmetric, but he does not move his mouth in a normal fashion VIII: hearing is intact to voice X: Uvula elevates symmetrically XI: Shoulder shrug is symmetric. XII: tongue is midline without atrophy or fasciculations.  Motor: Tone is normal. Bulk is normal. 5/5 strength was present on the right side, on the left he had give way weakness with inconsistent exam. He is markedly tremulous bilaterally Sensory: Sensation is diminished on the left Deep Tendon Reflexes: 2+  and symmetric in the biceps and patellae.  Cerebellar: No clear ataxia   I have reviewed labs in epic and the results pertinent to this consultation are: CMP-unremarkable  I have reviewed the images obtained: CT head-old infarct  Impression: 55 year old male with unusual exam and headache. Possibilities include complex migraine, though psychogenic manifestations are difficult to exclude given the abnormal character of his deficits. At this time, I would favor getting imaging, and treating as Migraine if imaging is  negative.  Recommendations: 1) MRI brain 2) if negative, then I would pursue treatment as complicated migraine.   Roland Rack, MD [facility] [phone]  If 7pm- 7am, please page neurology on call as listed in [place].

## 2016-01-08 NOTE — ED Provider Notes (Signed)
CSN: [id number]     Arrival date & time 01/08/16  1207 History   First MD Initiated Contact with Patient 01/08/16 1209     Chief Complaint  Patient presents with  . Aphasia  . Extremity Weakness    @EDPCLEARED @ (Consider location/radiation/quality/duration/timing/severity/associated sxs/prior Treatment) The history is provided by the patient and the spouse.  Brandon [name] is a 55 y.o. male hx of HTN, diverticulitis, stroke in 2014 with baseline aphasia, here presenting with slurred speech, left-sided weakness. Patient woke up around 6:30 AM. His wife saw him at that time and he appeared normal. He has baseline slurred speech and baseline left-sided weakness that is unchanged. He has been more anxious and stressed out since then. He called ambulance he had trouble speaking. EMS arrived and noticed that he has slurred speech as well as left-sided weakness. They called his son, who states that his speech is not baseline.   Level V caveat- condition of patient.   Past Medical History  Diagnosis Date  . Hypertension   . Diverticulitis     s/p micorperforation Sept 2012-managed conservatively by Gen surgery  . Kidney tumor 09/2011    Renal cell CA  . Wears glasses   . ED (erectile dysfunction)   . 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 Acute ischemic stroke ([place]) 11/2013  . Focal seizure ([place]) 11/2013    due to ischemic stroke  . Diabetes mellitus     DKA prior hospitalization  . Depression     BH Adm in [facility] Depression   Past Surgical History  Procedure Laterality Date  . Kidney surgery      ablation of renal cell CA - 12/28, prior one was October 2012-Dr. Kathlene Cote  . Tee without cardioversion N/A 11/19/2013    Procedure: TRANSESOPHAGEAL ECHOCARDIOGRAM (TEE);  Surgeon: Lelon Perla, MD;  Location: [facility] ENDOSCOPY;  Service: Cardiovascular;  Laterality: N/A;   Family History  Problem Relation Age of Onset  . Diabetes Father     Social History  Substance Use Topics  . Smoking status: Never Smoker   . Smokeless tobacco: Never Used  . Alcohol Use: No    Review of Systems  Musculoskeletal: Positive for extremity weakness.  Neurological: Positive for speech difficulty.  All other systems reviewed and are negative.     Allergies  Review of patient's allergies indicates no known allergies.  Home Medications   Prior to Admission medications   Medication Sig Start Date End Date Taking? Authorizing Provider  aspirin 325 MG tablet Take 1 tablet (325 mg total) by mouth daily. 04/23/15  Yes Camelia Eng Tysinger, PA-C  atorvastatin (LIPITOR) 40 MG tablet Take 1 tablet (40 mg total) by mouth daily. 01/05/16  Yes Camelia Eng Tysinger, PA-C  captopril-hydrochlorothiazide (CAPOZIDE) 50-15 MG tablet Take 1 tablet by mouth daily. 01/05/16  Yes Camelia Eng Tysinger, PA-C  DULoxetine (CYMBALTA) 60 MG capsule Take 1 capsule (60 mg total) by mouth 2 (two) times daily. 01/05/16  Yes Camelia Eng Tysinger, PA-C  insulin aspart protamine - aspart (NOVOLOG MIX 70/30 FLEXPEN) (70-30) 100 UNIT/ML FlexPen Inject 0.45 mLs (45 Units total) into the skin 2 (two) times daily with a meal. 01/05/16  Yes Camelia Eng Tysinger, PA-C  levETIRAcetam (KEPPRA) 500 MG tablet Take 1 tablet (500 mg total) by mouth 2 (two) times daily. 01/05/16  Yes Camelia Eng Tysinger, PA-C  Canagliflozin-Metformin HCl (INVOKAMET) 50-1000 MG TABS Take 1 tablet by mouth 2 (two)  times daily. 01/05/16   Camelia Eng Tysinger, PA-C   BP 167/104 mmHg  Pulse 79  Temp(Src) 98.1 F (36.7 C)  Resp 19  SpO2 100% Physical Exam  Constitutional:  Chronically ill   HENT:  Head: Normocephalic.  Mouth/Throat: Oropharynx is clear and moist.  Eyes: Conjunctivae are normal. Pupils are equal, round, and reactive to light.  Neck: Normal range of motion. Neck supple.  Cardiovascular: Normal rate, regular rhythm and normal heart sounds.   Pulmonary/Chest: Effort normal and breath sounds normal. No respiratory  distress. He has no wheezes. He has no rales.  Abdominal: Soft. Bowel sounds are normal. He exhibits no distension. There is no tenderness. There is no rebound.  Musculoskeletal: Normal range of motion. He exhibits no edema or tenderness.  Neurological:  Slurred speech. R facial droop. L arm and leg strength 4/5.   Skin: Skin is warm and dry.  Psychiatric:  Anxious   Nursing note and vitals reviewed.   ED Course  Procedures (including critical care time) Labs Review Labs Reviewed  COMPREHENSIVE METABOLIC PANEL - Abnormal; Notable for the following:    Glucose, Bld 59 (*)    All other components within normal limits  URINALYSIS, ROUTINE W REFLEX MICROSCOPIC (NOT AT [place]) - Abnormal; Notable for the following:    Hgb urine dipstick SMALL (*)    All other components within normal limits  URINE MICROSCOPIC-ADD ON - Abnormal; Notable for the following:    Squamous Epithelial / LPF 0-5 (*)    Bacteria, UA RARE (*)    All other components within normal limits  I-STAT CHEM 8, ED - Abnormal; Notable for the following:    Glucose, Bld 57 (*)    All other components within normal limits  ETHANOL  PROTIME-INR  APTT  CBC  DIFFERENTIAL  URINE RAPID DRUG SCREEN, HOSP PERFORMED  I-STAT TROPOININ, ED  I-STAT CREATININE, ED  CBG MONITORING, ED    Imaging Review Ct Head Wo Contrast  01/08/2016  CLINICAL DATA:  Code stroke EXAM: CT HEAD WITHOUT CONTRAST TECHNIQUE: Contiguous axial images were obtained from the base of the skull through the vertex without contrast. COMPARISON:  12/13/2014 FINDINGS: Encephalomalacia in the right parietal and temporal lobes with evidence of cortical hyperdense calcification from a remote right MCA territory infarct. No definite acute intracranial hemorrhage, mass lesion, new infarction, midline shift, herniation, hydrocephalus, or extra-axial fluid collection. Normal gray-white matter differentiation. Cisterns are patent. No cerebellar abnormality. Orbits are  symmetric. No acute osseous finding. Mastoids and sinuses remain clear. IMPRESSION: Remote right parietal temporal infarct with encephalomalacia. No acute finding or interval change by noncontrast CT. Electronically Signed   By: Jerilynn Mages.  Shick M.D.   On: 01/08/2016 12:41   I have personally reviewed and evaluated these images and lab results as part of my medical decision-making.   EKG Interpretation   Date/Time:  Friday January 08 2016 12:17:43 EST Ventricular Rate:  80 PR Interval:  204 QRS Duration: 101 QT Interval:  376 QTC Calculation: 434 R Axis:   46 Text Interpretation:  Sinus rhythm Borderline prolonged PR interval  Anteroseptal infarct, old Borderline T abnormalities, inferior leads No  significant change since last tracing Confirmed by Farran Amsden  MD, Gwyndolyn Guilford ([id number])  on 01/08/2016 12:23:36 PM      MDM   Final diagnoses:  Complicated migraine   Brandon [name] is a 55 y.o. male here with slurred speech, l sided weakness. He is just outside of the 4 hr window (last normal 6:30 am) but  within the extended window. Has significant deficits. Code stroke activated. Dr. Leonel Ramsay to see patient.   1 pm Patient's CT unremarkable. He recommend MRI brain. Deficits improving. Thinks maybe psych. If MRI neg, doesn't need further stroke workup.   4:35 PM MRI pending. Likely complex migraine vs stroke. If MRI neg, Dr. Leonel Ramsay wants to give migraine cocktail and discharge. Signed out to Dr. Laneta Simmers in the ED to follow up MRI and reassess.    Wandra Arthurs, MD 01/08/16 [phone]

## 2016-01-08 NOTE — Code Documentation (Signed)
55yo male arriving to [facility] via [facility] at 1207.  Patient with h/o stroke.  Patient went to sleep at 0730 this morning and woke up with left sided weakness.  Code stroke called on arrival.  Patient to CT.  Stroke team to the bedside.  NIHSS 7, see documentation for details and code stroke times.  Patient with bilateral leg weakness and slow, stuttering speech.  Patient also reports decreased sensation on the left side.  Patient is outside the window for treatment with tPA.  Patient with inconsistent exam.  Patient to go to MRI.  Bedside handoff with ED RN Melissa.

## 2016-01-08 NOTE — Discharge Instructions (Signed)
Take your medicines as prescribed.   See your doctor and psychiatrist.   Take tylenol, motrin for headaches.   Return to ER if you have worse slurred speech, weakness.

## 2016-02-18 ENCOUNTER — Ambulatory Visit
Admission: RE | Admit: 2016-02-18 | Discharge: 2016-02-18 | Disposition: A | Discharge status: Home / Self Care | Payer: Medicaid Other | Source: Ambulatory Visit | Attending: Interventional Radiology | Admitting: Interventional Radiology

## 2016-02-18 DIAGNOSIS — N2889 Other specified disorders of kidney and ureter: Secondary | ICD-10-CM

## 2016-02-18 NOTE — Progress Notes (Signed)
Pt reports that he occasionally discomfort of Left flank which he feels is related to Cryoablation of Left Renal Mass.  States that this feels "a little like a pinching sensation or a minor twinge".  Resolves without pain meds.  Patient also states that he has been experiencing problems completely emptying his bladder.  He also describes occasional symptoms of stress incontinence.  He is concerned that he may need to wear Depends.  He says that it has been "a long time since he had an appointment with Dr. Carolan Clines.  Rapheal Masso Riki Rusk, RN 02/18/2016 1:31 PM

## 2016-02-18 NOTE — Progress Notes (Signed)
Chief Complaint: Status post percutaneous cryoablation of bilateral renal carcinomas.  History of Present Illness: Brandon [name] is a 55 y.o. 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 Since prior clinic evaluation in September, 2015, the patient did have an interval CT of the abdomen and pelvis through the emergency department at [facility] on 12/13/2014. This was felt adequate in determining stability of bilateral ablation defects and further abdominal imaging was not performed last year. He returns for follow-up and is now just over 4 years post ablation of bilateral renal carcinomas.  He complains of some occasional minimal discomfort in the left flank region which resolves on its own and is dramatically improved compared to the initial postprocedural pain he experienced on that side after ablation. He has some problems currently completely emptying his bladder and describes occasional stress incontinence.  Past Medical History  Diagnosis Date  . Hypertension   . Diverticulitis     s/p micorperforation Sept 2012-managed conservatively by Gen surgery  . Kidney tumor 09/2011    Renal cell CA  . Wears glasses   . ED (erectile dysfunction)   . 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 Acute ischemic stroke ([place]) 11/2013  . Focal seizure ([place]) 11/2013    due to ischemic stroke  . Diabetes mellitus     DKA prior hospitalization  . Depression     BH Adm in [facility] Depression    Past Surgical History  Procedure Laterality Date  . Kidney surgery      ablation of renal cell CA - 12/28, prior one was October 2012-Dr. Kathlene Cote  . Tee without cardioversion N/A 11/19/2013    Procedure: TRANSESOPHAGEAL ECHOCARDIOGRAM (TEE);  Surgeon: Lelon Perla, MD;  Location: [facility] ENDOSCOPY;  Service: Cardiovascular;  Laterality: N/A;     Allergies: Review of patient's allergies indicates no known allergies.  Medications: Prior to Admission medications   Medication Sig Start Date End Date Taking? Authorizing Provider  aspirin 325 MG tablet Take 1 tablet (325 mg total) by mouth daily. 04/23/15  Yes Camelia Eng Tysinger, PA-C  atorvastatin (LIPITOR) 40 MG tablet Take 1 tablet (40 mg total) by mouth daily. 01/05/16  Yes Camelia Eng Tysinger, PA-C  captopril-hydrochlorothiazide (CAPOZIDE) 50-15 MG tablet Take 1 tablet by mouth daily. 01/05/16  Yes Camelia Eng Tysinger, PA-C  DULoxetine (CYMBALTA) 60 MG capsule Take 1 capsule (60 mg total) by mouth 2 (two) times daily. 01/05/16  Yes Camelia Eng Tysinger, PA-C  insulin aspart protamine - aspart (NOVOLOG MIX 70/30 FLEXPEN) (70-30) 100 UNIT/ML FlexPen Inject 0.45 mLs (45 Units total) into the skin 2 (two) times daily with a meal. Patient taking differently: Inject 40 Units into the skin 2 (two) times daily with a meal.  01/05/16  Yes Camelia Eng Tysinger, PA-C  levETIRAcetam (KEPPRA) 500 MG tablet Take 1 tablet (500 mg total) by mouth 2 (two) times daily. 01/05/16  Yes Camelia Eng Tysinger, PA-C  Canagliflozin-Metformin HCl (INVOKAMET) 50-1000 MG TABS Take 1 tablet by mouth 2 (two) times daily. Patient not taking: Reported on 02/18/2016 01/05/16   Carlena Hurl, PA-C     Family History  Problem Relation Age of Onset  . Diabetes Father     Social History   Social History  . Marital Status: Single    Spouse Name: N/A  . Number  of Children: 2  . Years of Education: college   Occupational History  .     Social History Main Topics  . Smoking status: Never Smoker   . Smokeless tobacco: Never Used  . Alcohol Use: No  . Drug Use: No  . Sexual Activity: No   Other Topics Concern  . Not on file   Social History Narrative   Patient lives at home with his family.   Education. Two years of college.   Not working.   Right handed.   Caffeine one soda daily.    Review of Systems: A 12 point ROS  discussed and pertinent positives are indicated in the HPI above.  All other systems are negative.  Review of Systems  Constitutional: Negative.   Respiratory: Negative.   Cardiovascular: Negative.   Gastrointestinal: Negative.   Genitourinary: Negative.   Musculoskeletal: Negative.   Neurological:       Stable left sided weakness and speech difficulty following prior right MCA territory stroke.    Vital Signs: BP 144/87 mmHg  Pulse 70  Temp(Src) 97.6 F (36.4 C) (Oral)  Resp 14  Ht 6' 1.5" (1.867 m)  Wt 255 lb (115.667 kg)  BMI 33.18 kg/m2  SpO2 100%  Physical Exam  Constitutional: He is oriented to person, place, and time. He appears well-developed and well-nourished. No distress.  Abdominal: Soft. He exhibits no distension and no mass. There is no tenderness. There is no rebound and no guarding.  Neurological: He is alert and oriented to person, place, and time.  Skin: He is not diaphoretic.    Labs:  CBC:  Recent Labs  04/23/15 0001 10/19/15 0001 01/08/16 1230 01/08/16 1233  WBC 4.6 6.0  --  5.7  HGB 13.7 13.3 14.6 13.9  HCT 39.6 38.0* 43.0 39.5  PLT 231 229  --  223    COAGS:  Recent Labs  01/08/16 1233  INR 1.17  APTT 32    BMP:  Recent Labs  04/23/15 0001 10/19/15 0001 01/01/16 1054 01/08/16 1230 01/08/16 1231 01/08/16 1233  NA 138 136  --  143  --  142  K 3.5 3.6  --  3.7  --  3.6  CL 100 98  --  103  --  105  CO2 28 27  --   --   --  26  GLUCOSE 75 292*  --  57*  --  59*  BUN 17 16  --  20  --  18  CALCIUM 9.9 9.3  --   --   --  9.6  CREATININE 1.05 0.96 0.90 0.90 0.90 0.99  GFRNONAA  --   --   --   --   --  >60  GFRAA  --   --   --   --   --  >60    LIVER FUNCTION TESTS:  Recent Labs  04/23/15 0001 10/19/15 0001 01/08/16 1233  BILITOT 0.7 1.1 0.5  AST '21 22 23  ' ALT '23 26 22  ' ALKPHOS 105 108 117  PROT 7.8 7.2 7.4  ALBUMIN 4.5 4.1 4.0    Assessment and Plan:  I met with Brandon [name] and reviewed a follow-up CT of  the abdomen performed on 01/01/2016. This demonstrates stable ablation defects of both kidneys with no evidence of recurrent enhancing tumor. No new renal masses are identified. Renal function is stable and normal.  I recommended that we image the ablation sites on a routine basis one more time next year.  This will provide 5 year stability post ablation of bilateral renal carcinomas. If stable next year, routine imaging of the kidneys would not be necessary unless there were any new symptoms. The patient is agreeable.  I recommended that Brandon [name] go back to see Dr. Gaynelle Arabian given his more recent urinary complaints. We will call [facility] to get him an appointment.  Electronically SignedAletta Edouard T 02/18/2016, 4:48 PM   I spent a total of 15 Minutes in face to face in clinical consultation, greater than 50% of which was counseling/coordinating care post cryoablation of bilateral renal carcinomas.

## 2016-02-23 ENCOUNTER — Telehealth: Payer: Self-pay | Admitting: Family Medicine
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
## 2016-02-24 NOTE — Telephone Encounter (Signed)
Beverlee Nims   I think it is interesting that they have a 45 min video of the entire event, which would be interesting to see.    I would be willing to write a letter or written deposition on his behalf.  I am not in a position to travel up there.     Let me know what Risk Management says.  [name]

## 2016-02-29 ENCOUNTER — Emergency Department (HOSPITAL_COMMUNITY)
Admission: EM | Admit: 2016-02-29 | Discharge: 2016-03-01 | Disposition: A | Discharge status: Other Acute Inpatient Hospital | Payer: Medicaid Other | Attending: Emergency Medicine | Admitting: Emergency Medicine

## 2016-02-29 ENCOUNTER — Encounter (HOSPITAL_COMMUNITY): Payer: Self-pay | Admitting: Emergency Medicine

## 2016-02-29 DIAGNOSIS — R45851 Suicidal ideations: Secondary | ICD-10-CM

## 2016-02-29 DIAGNOSIS — I10 Essential (primary) hypertension: Secondary | ICD-10-CM | POA: Insufficient documentation

## 2016-02-29 DIAGNOSIS — Z8673 Personal history of transient ischemic attack (TIA), and cerebral infarction without residual deficits: Secondary | ICD-10-CM | POA: Insufficient documentation

## 2016-02-29 DIAGNOSIS — Z85528 Personal history of other malignant neoplasm of kidney: Secondary | ICD-10-CM | POA: Insufficient documentation

## 2016-02-29 DIAGNOSIS — F209 Schizophrenia, unspecified: Secondary | ICD-10-CM

## 2016-02-29 DIAGNOSIS — F329 Major depressive disorder, single episode, unspecified: Secondary | ICD-10-CM | POA: Insufficient documentation

## 2016-02-29 DIAGNOSIS — R358 Other polyuria: Secondary | ICD-10-CM | POA: Insufficient documentation

## 2016-02-29 DIAGNOSIS — Z8719 Personal history of other diseases of the digestive system: Secondary | ICD-10-CM | POA: Insufficient documentation

## 2016-02-29 DIAGNOSIS — R631 Polydipsia: Secondary | ICD-10-CM | POA: Insufficient documentation

## 2016-02-29 DIAGNOSIS — Z9114 Patient's other noncompliance with medication regimen: Secondary | ICD-10-CM | POA: Insufficient documentation

## 2016-02-29 DIAGNOSIS — F29 Unspecified psychosis not due to a substance or known physiological condition: Secondary | ICD-10-CM | POA: Insufficient documentation

## 2016-02-29 DIAGNOSIS — Z91148 Patient's other noncompliance with medication regimen for other reason: Secondary | ICD-10-CM

## 2016-02-29 DIAGNOSIS — E119 Type 2 diabetes mellitus without complications: Secondary | ICD-10-CM | POA: Insufficient documentation

## 2016-02-29 DIAGNOSIS — Z87438 Personal history of other diseases of male genital organs: Secondary | ICD-10-CM | POA: Insufficient documentation

## 2016-02-29 LAB — CBC
HCT: 41.2 % (ref 39.0–52.0)
Hemoglobin: 14.8 g/dL (ref 13.0–17.0)
MCH: 30.7 pg (ref 26.0–34.0)
MCHC: 35.9 g/dL (ref 30.0–36.0)
MCV: 85.5 fL (ref 78.0–100.0)
PLATELETS: 245 10*3/uL (ref 150–400)
RBC: 4.82 MIL/uL (ref 4.22–5.81)
RDW: 11.6 % (ref 11.5–15.5)
WBC: 6.4 10*3/uL (ref 4.0–10.5)

## 2016-02-29 LAB — RAPID URINE DRUG SCREEN, HOSP PERFORMED
Amphetamines: NOT DETECTED
Barbiturates: NOT DETECTED
Benzodiazepines: NOT DETECTED
COCAINE: NOT DETECTED
OPIATES: NOT DETECTED
Tetrahydrocannabinol: NOT DETECTED

## 2016-02-29 LAB — COMPREHENSIVE METABOLIC PANEL
ALT: 21 U/L (ref 17–63)
AST: 17 U/L (ref 15–41)
Albumin: 4.7 g/dL (ref 3.5–5.0)
Alkaline Phosphatase: 147 U/L — ABNORMAL HIGH (ref 38–126)
Anion gap: 13 (ref 5–15)
BUN: 13 mg/dL (ref 6–20)
CHLORIDE: 95 mmol/L — AB (ref 101–111)
CO2: 27 mmol/L (ref 22–32)
CREATININE: 1.4 mg/dL — AB (ref 0.61–1.24)
Calcium: 10.3 mg/dL (ref 8.9–10.3)
GFR calc non Af Amer: 56 mL/min — ABNORMAL LOW (ref >60)
Glucose, Bld: 579 mg/dL (ref 65–99)
POTASSIUM: 5.3 mmol/L — AB (ref 3.5–5.1)
SODIUM: 135 mmol/L (ref 135–145)
Total Bilirubin: 1.6 mg/dL — ABNORMAL HIGH (ref 0.3–1.2)
Total Protein: 8.7 g/dL — ABNORMAL HIGH (ref 6.5–8.1)

## 2016-02-29 LAB — ETHANOL: Alcohol, Ethyl (B): <5 mg/dL (ref <5)

## 2016-02-29 LAB — CBG MONITORING, ED
GLUCOSE-CAPILLARY: 458 mg/dL — AB (ref 65–99)
GLUCOSE-CAPILLARY: 353 mg/dL — AB (ref 65–99)

## 2016-02-29 LAB — ACETAMINOPHEN LEVEL

## 2016-02-29 LAB — SALICYLATE LEVEL

## 2016-02-29 MED ORDER — CAPTOPRIL-HYDROCHLOROTHIAZIDE 50-15 MG PO TABS: 1.0000 | ORAL_TABLET | Freq: Every day | ORAL | Status: DC

## 2016-02-29 MED ORDER — CAPTOPRIL 50 MG PO TABS
50.0000 mg | ORAL_TABLET | Freq: Every day | ORAL | Status: DC
  Administered 2016-02-29 – 2016-03-01 (×2): 50 mg via ORAL
  Filled 2016-02-29 (×2): qty 1

## 2016-02-29 MED ORDER — NICOTINE 21 MG/24HR TD PT24: 21.0000 mg | MEDICATED_PATCH | Freq: Every day | TRANSDERMAL | Status: DC | PRN

## 2016-02-29 MED ORDER — INSULIN ASPART PROT & ASPART (70-30 MIX) 100 UNIT/ML ~~LOC~~ SUSP
45.0000 [IU] | Freq: Two times a day (BID) | SUBCUTANEOUS | Status: DC
  Administered 2016-02-29 – 2016-03-01 (×2): 45 [IU] via SUBCUTANEOUS
  Filled 2016-02-29: qty 10

## 2016-02-29 MED ORDER — LORAZEPAM 1 MG PO TABS: 1.0000 mg | ORAL_TABLET | Freq: Three times a day (TID) | ORAL | Status: DC | PRN

## 2016-02-29 MED ORDER — ATORVASTATIN CALCIUM 40 MG PO TABS
40.0000 mg | ORAL_TABLET | Freq: Every day | ORAL | Status: DC
  Administered 2016-02-29 – 2016-03-01 (×2): 40 mg via ORAL
  Filled 2016-02-29 (×2): qty 1

## 2016-02-29 MED ORDER — ONDANSETRON HCL 4 MG PO TABS
4.0000 mg | ORAL_TABLET | Freq: Three times a day (TID) | ORAL | Status: DC | PRN
Start: 2016-02-29 — End: 2016-03-01

## 2016-02-29 MED ORDER — LEVETIRACETAM 500 MG PO TABS
500.0000 mg | ORAL_TABLET | Freq: Two times a day (BID) | ORAL | Status: DC
  Administered 2016-02-29 – 2016-03-01 (×2): 500 mg via ORAL
  Filled 2016-02-29 (×2): qty 1

## 2016-02-29 MED ORDER — CANAGLIFLOZIN 100 MG PO TABS
100.0000 mg | ORAL_TABLET | Freq: Every day | ORAL | Status: DC
  Administered 2016-03-01: 100 mg via ORAL
  Filled 2016-02-29: qty 1

## 2016-02-29 MED ORDER — METFORMIN HCL 500 MG PO TABS
1000.0000 mg | ORAL_TABLET | Freq: Two times a day (BID) | ORAL | Status: DC
  Filled 2016-02-29: qty 2

## 2016-02-29 MED ORDER — ASPIRIN 325 MG PO TABS
325.0000 mg | ORAL_TABLET | Freq: Every day | ORAL | Status: DC
  Administered 2016-02-29 – 2016-03-01 (×2): 325 mg via ORAL
  Filled 2016-02-29 (×2): qty 1

## 2016-02-29 MED ORDER — SODIUM CHLORIDE 0.9 % IV BOLUS (SEPSIS)
1000.0000 mL | Freq: Once | INTRAVENOUS | Status: AC
  Administered 2016-02-29: 1000 mL via INTRAVENOUS

## 2016-02-29 MED ORDER — DULOXETINE HCL 60 MG PO CPEP
60.0000 mg | ORAL_CAPSULE | Freq: Every day | ORAL | Status: DC
  Administered 2016-03-01: 60 mg via ORAL
  Filled 2016-02-29: qty 1

## 2016-02-29 MED ORDER — CANAGLIFLOZIN-METFORMIN HCL 50-1000 MG PO TABS: 1.0000 | ORAL_TABLET | Freq: Two times a day (BID) | ORAL | Status: DC

## 2016-02-29 MED ORDER — ALUM & MAG HYDROXIDE-SIMETH 200-200-20 MG/5ML PO SUSP: 30.0000 mL | ORAL | Status: DC | PRN

## 2016-02-29 MED ORDER — HYDROCHLOROTHIAZIDE 10 MG/ML ORAL SUSPENSION
15.0000 mg | Freq: Every day | ORAL | Status: DC
  Administered 2016-02-29 – 2016-03-01 (×2): 15 mg via ORAL
  Filled 2016-02-29 (×2): qty 1.88

## 2016-02-29 MED ORDER — INSULIN ASPART PROT & ASPART (70-30 MIX) 100 UNIT/ML PEN
45.0000 [IU] | PEN_INJECTOR | Freq: Two times a day (BID) | SUBCUTANEOUS | Status: DC
  Filled 2016-02-29: qty 3

## 2016-02-29 MED ORDER — ACETAMINOPHEN 325 MG PO TABS: 650.0000 mg | ORAL_TABLET | ORAL | Status: DC | PRN

## 2016-02-29 MED ORDER — INSULIN ASPART 100 UNIT/ML ~~LOC~~ SOLN
10.0000 [IU] | Freq: Once | SUBCUTANEOUS | Status: AC
  Administered 2016-02-29: 10 [IU] via SUBCUTANEOUS
  Filled 2016-02-29: qty 1
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
## 2016-02-29 NOTE — ED Notes (Signed)
TTS machine at bedside. 

## 2016-02-29 NOTE — ED Provider Notes (Signed)
CSN: [id number]     Arrival date & time 02/29/16  1253 History   First MD Initiated Contact with Patient 02/29/16 1633     Chief Complaint  Patient presents with  . Medical Clearance  . Suicidal     (Consider location/radiation/quality/duration/timing/severity/associated sxs/prior Treatment) HPI Comments: Patient with history of stroke, diabetes, on oral medications and insulin -- presents under IVC for worsening suicidal ideation, psychosis (auditory hallucinations). He states that 'I cannot do anything right and sometimes I feel like I would be better off not being here'. Patient has not been on any medications for 1 month stating they cost too much. States polydipsia and polyuria. No abdominal pain, vomiting. The onset of this condition was acute. The course is constant. Aggravating factors: none. Alleviating factors: none.    The history is provided by the patient and medical records.    Past Medical History  Diagnosis Date  . Hypertension   . Diverticulitis     s/p micorperforation Sept 2012-managed conservatively by Gen surgery  . Kidney tumor 09/2011    Renal cell CA  . Wears glasses   . ED (erectile dysfunction)   . 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 Acute ischemic stroke ([place]) 11/2013  . Focal seizure ([place]) 11/2013    due to ischemic stroke  . Diabetes mellitus     DKA prior hospitalization  . Depression     BH Adm in [facility] Depression   Past Surgical History  Procedure Laterality Date  . Kidney surgery      ablation of renal cell CA - 12/28, prior one was October 2012-Dr. Kathlene Cote  . Tee without cardioversion N/A 11/19/2013    Procedure: TRANSESOPHAGEAL ECHOCARDIOGRAM (TEE);  Surgeon: Lelon Perla, MD;  Location: [facility] ENDOSCOPY;  Service: Cardiovascular;  Laterality: N/A;   Family History  Problem Relation Age of Onset  . Diabetes Father    Social History  Substance Use Topics  . Smoking status: Never Smoker   .  Smokeless tobacco: Never Used  . Alcohol Use: No    Review of Systems  Constitutional: Negative for fever.  HENT: Negative for rhinorrhea and sore throat.   Eyes: Negative for redness.  Respiratory: Negative for cough.   Cardiovascular: Negative for chest pain.  Gastrointestinal: Negative for nausea, vomiting, abdominal pain and diarrhea.  Endocrine: Positive for polydipsia and polyuria.  Genitourinary: Negative for dysuria.  Musculoskeletal: Negative for myalgias.  Skin: Negative for rash.  Neurological: Negative for headaches.  Psychiatric/Behavioral: Positive for suicidal ideas and behavioral problems.      Allergies  Review of patient's allergies indicates no known allergies.  Home Medications   Prior to Admission medications   Medication Sig Start Date End Date Taking? Authorizing Provider  aspirin 325 MG tablet Take 1 tablet (325 mg total) by mouth daily. Patient not taking: Reported on 02/29/2016 04/23/15   Camelia Eng Tysinger, PA-C  atorvastatin (LIPITOR) 40 MG tablet Take 1 tablet (40 mg total) by mouth daily. Patient not taking: Reported on 02/29/2016 01/05/16   Camelia Eng Tysinger, PA-C  Canagliflozin-Metformin HCl (INVOKAMET) 50-1000 MG TABS Take 1 tablet by mouth 2 (two) times daily. Patient not taking: Reported on 02/18/2016 01/05/16   Camelia Eng Tysinger, PA-C  captopril-hydrochlorothiazide (CAPOZIDE) 50-15 MG tablet Take 1 tablet by mouth daily. Patient not taking: Reported on 02/29/2016 01/05/16   Camelia Eng Tysinger, PA-C  DULoxetine (CYMBALTA) 60 MG capsule Take 1 capsule (60 mg total) by  mouth 2 (two) times daily. Patient not taking: Reported on 02/29/2016 01/05/16   Camelia Eng Tysinger, PA-C  feeding supplement (BOOST / RESOURCE BREEZE) LIQD Take 1 Container by mouth daily. Reported on 02/29/2016    Historical Provider, MD  insulin aspart protamine - aspart (NOVOLOG MIX 70/30 FLEXPEN) (70-30) 100 UNIT/ML FlexPen Inject 0.45 mLs (45 Units total) into the skin 2 (two) times daily with  a meal. Patient not taking: Reported on 02/29/2016 01/05/16   Camelia Eng Tysinger, PA-C  levETIRAcetam (KEPPRA) 500 MG tablet Take 1 tablet (500 mg total) by mouth 2 (two) times daily. Patient not taking: Reported on 02/29/2016 01/05/16   Camelia Eng Tysinger, PA-C   BP 157/94 mmHg  Pulse 87  Temp(Src) 98 F (36.7 C) (Oral)  Resp 14  SpO2 100%   Physical Exam  Constitutional: He appears well-developed and well-nourished.  HENT:  Head: Normocephalic and atraumatic.  Dry oral mucosa.  Eyes: Conjunctivae are normal. Right eye exhibits no discharge. Left eye exhibits no discharge.  Neck: Normal range of motion. Neck supple.  Cardiovascular: Normal rate, regular rhythm and normal heart sounds.   Pulmonary/Chest: Effort normal and breath sounds normal.  Abdominal: Soft. There is no tenderness.  Neurological: He is alert.  Skin: Skin is warm and dry.  Psychiatric: His affect is labile. He exhibits a depressed mood. He expresses suicidal ideation. He expresses no homicidal ideation.  Nursing note and vitals reviewed.   ED Course  Procedures (including critical care time) Labs Review Labs Reviewed  COMPREHENSIVE METABOLIC PANEL - Abnormal; Notable for the following:    Potassium 5.3 (*)    Chloride 95 (*)    Glucose, Bld 579 (*)    Creatinine, Ser 1.40 (*)    Total Protein 8.7 (*)    Alkaline Phosphatase 147 (*)    Total Bilirubin 1.6 (*)    GFR calc non Af Amer 56 (*)    All other components within normal limits  ACETAMINOPHEN LEVEL - Abnormal; Notable for the following:    Acetaminophen (Tylenol), Serum <10 (*)    All other components within normal limits  CBG MONITORING, ED - Abnormal; Notable for the following:    Glucose-Capillary 458 (*)    All other components within normal limits  CBG MONITORING, ED - Abnormal; Notable for the following:    Glucose-Capillary 353 (*)    All other components within normal limits  CBG MONITORING, ED - Abnormal; Notable for the following:     Glucose-Capillary 293 (*)    All other components within normal limits  ETHANOL  SALICYLATE LEVEL  CBC  URINE RAPID DRUG SCREEN, HOSP PERFORMED    Imaging Review No results found. I have personally reviewed and evaluated these images and lab results as part of my medical decision-making.   EKG Interpretation None       4:37 PM Patient seen and examined. Work-up initiated. Medications ordered. Will need TTS consult. IVC paperwork is not presently available.   Vital signs reviewed and are as follows: BP 157/94 mmHg  Pulse 87  Temp(Src) 98 F (36.7 C) (Oral)  Resp 14  SpO2 100%  7:04 PM Blood sugar improved to 300's. Will restart his insulin/oral antihyperglycemic regimen. He is otherwise medically cleared at this time.   8:33 PM TTS consult has been performed. Awaiting placement decision.   1:33 AM Patient meets inpatient criteria -- currently awaiting placement. No beds currently at [facility].   MDM   Final diagnoses:  Suicidal ideation  Schizophrenia, unspecified  type [facility])  Noncompliance with medication regimen   Pending placement. Hyperglycemia treated and improved -- no DKA.    [name], PA-C 03/01/16 0133  Daleen Bo, MD 03/01/16 [phone]

## 2016-02-29 NOTE — ED Notes (Signed)
Alerted MP of critical lab

## 2016-02-29 NOTE — ED Notes (Signed)
Pt was wand and staffing was called

## 2016-02-29 NOTE — ED Notes (Signed)
Pt was
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
## 2016-02-29 NOTE — ED Notes (Signed)
Patient with [employer]. Staffing office called for sitter. Staffing office states that no sitter at this time, but would look for sitter.

## 2016-02-29 NOTE — Progress Notes (Addendum)
Patriciaann Clan, PA-C, reccommended that patient needs psychiatric inpatient treatment. No beds at [facility] so patient has been referred out.  Patient has been referred to: [facility] per Michele Mcalpine, accepting referrals  [facility] - per Tamela Oddi, send referral. [facility] - accepting referrals, per intake. [facility] - accepting referrals.  Left voicemail for [facility] inquiring about bed status.  At capacity: [facility] - per Jimmy Picket - per Antony Madura, male bed only. [place] [facility] [place] - per Fraser Din, call back tomorrow, d/c's in the am. [facility] - per Russella Dar - per [facility]  CSW will continue to seek placement.  Verlon Setting, [place] Disposition staff 02/29/2016 10:35 PM

## 2016-02-29 NOTE — ED Notes (Signed)
This RN contacted staffing and they will try to send a sitter at 11pm.

## 2016-03-01 ENCOUNTER — Inpatient Hospital Stay (HOSPITAL_COMMUNITY)
Admission: AD | Admit: 2016-03-01 | Discharge: 2016-03-05 | DRG: 885 | Disposition: A | Discharge status: Home / Self Care | Payer: Medicaid Other | Attending: Psychiatry | Admitting: Psychiatry

## 2016-03-01 ENCOUNTER — Encounter (HOSPITAL_COMMUNITY): Payer: Self-pay

## 2016-03-01 DIAGNOSIS — Z8673 Personal history of transient ischemic attack (TIA), and cerebral infarction without residual deficits: Secondary | ICD-10-CM | POA: Diagnosis not present

## 2016-03-01 DIAGNOSIS — N179 Acute kidney failure, unspecified: Secondary | ICD-10-CM | POA: Diagnosis present

## 2016-03-01 DIAGNOSIS — Z85528 Personal history of other malignant neoplasm of kidney: Secondary | ICD-10-CM

## 2016-03-01 DIAGNOSIS — F329 Major depressive disorder, single episode, unspecified: Secondary | ICD-10-CM | POA: Diagnosis present

## 2016-03-01 DIAGNOSIS — R739 Hyperglycemia, unspecified: Secondary | ICD-10-CM

## 2016-03-01 DIAGNOSIS — E1165 Type 2 diabetes mellitus with hyperglycemia: Secondary | ICD-10-CM | POA: Diagnosis present

## 2016-03-01 DIAGNOSIS — F332 Major depressive disorder, recurrent severe without psychotic features: Principal | ICD-10-CM | POA: Diagnosis present

## 2016-03-01 DIAGNOSIS — R45851 Suicidal ideations: Secondary | ICD-10-CM | POA: Diagnosis present

## 2016-03-01 DIAGNOSIS — I10 Essential (primary) hypertension: Secondary | ICD-10-CM | POA: Diagnosis present

## 2016-03-01 DIAGNOSIS — IMO0001 Reserved for inherently not codable concepts without codable children: Secondary | ICD-10-CM | POA: Diagnosis present

## 2016-03-01 LAB — GLUCOSE, CAPILLARY: Glucose-Capillary: 177 mg/dL — ABNORMAL HIGH (ref 65–99)

## 2016-03-01 LAB — CBG MONITORING, ED
GLUCOSE-CAPILLARY: 293 mg/dL — AB (ref 65–99)
Glucose-Capillary: 167 mg/dL — ABNORMAL HIGH (ref 65–99)
Glucose-Capillary: 315 mg/dL — ABNORMAL HIGH (ref 65–99)

## 2016-03-01 MED ORDER — INSULIN ASPART 100 UNIT/ML ~~LOC~~ SOLN
0.0000 [IU] | Freq: Three times a day (TID) | SUBCUTANEOUS | Status: DC
  Administered 2016-03-02: 3 [IU] via SUBCUTANEOUS
  Administered 2016-03-02: 15 [IU] via SUBCUTANEOUS
  Administered 2016-03-03: 11 [IU] via SUBCUTANEOUS
  Administered 2016-03-03: 3 [IU] via SUBCUTANEOUS
  Administered 2016-03-03 – 2016-03-04 (×2): 8 [IU] via SUBCUTANEOUS
  Administered 2016-03-04 (×2): 15 [IU] via SUBCUTANEOUS

## 2016-03-01 MED ORDER — INSULIN ASPART 100 UNIT/ML ~~LOC~~ SOLN
4.0000 [IU] | Freq: Three times a day (TID) | SUBCUTANEOUS | Status: DC
  Administered 2016-03-02 – 2016-03-04 (×7): 4 [IU] via SUBCUTANEOUS

## 2016-03-01 MED ORDER — ACETAMINOPHEN 325 MG PO TABS
650.0000 mg | ORAL_TABLET | Freq: Four times a day (QID) | ORAL | Status: DC | PRN
Start: 2016-03-01 — End: 2016-03-05

## 2016-03-01 MED ORDER — ALUM & MAG HYDROXIDE-SIMETH 200-200-20 MG/5ML PO SUSP: 30.0000 mL | ORAL | Status: DC | PRN

## 2016-03-01 MED ORDER — INSULIN ASPART 100 UNIT/ML ~~LOC~~ SOLN
0.0000 [IU] | Freq: Every day | SUBCUTANEOUS | Status: DC
  Administered 2016-03-02 – 2016-03-03 (×2): 4 [IU] via SUBCUTANEOUS
  Administered 2016-03-04: 2 [IU] via SUBCUTANEOUS

## 2016-03-01 MED ORDER — MAGNESIUM HYDROXIDE 400 MG/5ML PO SUSP: 30.0000 mL | Freq: Every day | ORAL | Status: DC | PRN

## 2016-03-01 MED ORDER — TRAZODONE HCL 50 MG PO TABS
50.0000 mg | ORAL_TABLET | Freq: Every evening | ORAL | Status: DC | PRN
  Administered 2016-03-01 – 2016-03-03 (×3): 50 mg via ORAL
  Filled 2016-03-01 (×3): qty 1

## 2016-03-01 MED ORDER — ENSURE ENLIVE PO LIQD
237.0000 mL | Freq: Two times a day (BID) | ORAL | Status: DC
  Administered 2016-03-01: 237 mL via ORAL

## 2016-03-01 NOTE — ED Provider Notes (Signed)
Patient is evaluated for EMTALA. Patient is alert and nontoxic. He reports he developed a headache within the past several hours. It has throbbing quality and is in his forehead and the back of the head. No associated symptoms. Patient is alert and oriented. His movements are coordinated and symmetric. No focal neurologic deficit. Lungs are regular and clear. Patient's vital signs are stable. He has no hypertension or tachycardia. No fever. At this time findings are most consistent with tension headache. Patient will be given Tylenol. Patient is cleared for transport for ongoing psychiatric management.  Charlesetta Shanks, MD 03/01/16 [phone]

## 2016-03-01 NOTE — ED Notes (Signed)
Meal ordered

## 2016-03-01 NOTE — Progress Notes (Signed)
Adult Psychoeducational Group Note  Date:  03/01/2016 Time:  8:25 PM  Group Topic/Focus:  Wrap-Up Group:   The focus of this group is to help patients review their daily goal of treatment and discuss progress on daily workbooks.  Participation Level:  Did Not Attend  Pt was in bed during wrap-up group.    Brandon [name] 03/01/2016, 9:45 PM

## 2016-03-01 NOTE — ED Notes (Signed)
Pt given breakfast tray
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
## 2016-03-01 NOTE — Tx Team (Signed)
Initial Interdisciplinary Treatment Plan   PATIENT STRESSORS: Financial difficulties Health problems Housing Concerns   PATIENT STRENGTHS: Ability for insight Communication skills Motivation for treatment/growth Supportive family/friends   PROBLEM LIST: Problem List/Patient Goals Date to be addressed Date deferred Reason deferred Estimated date of resolution  "I am here to get help, I am really glad to be here" 03/01/2016       "I hear these people, I won't say they aren't real because we have conversations" 03/01/2016     "Mrs. Felton Clinton and my son may have to leave because of her work" 03/01/2016     "I want to give back something one day, I would like to work, I just can't" 03/01/2016                                    DISCHARGE CRITERIA:  Adequate post-discharge living arrangements Improved stabilization in mood, thinking, and/or behavior Motivation to continue treatment in a less acute level of care Safe-care adequate arrangements made Verbal commitment to aftercare and medication compliance  PRELIMINARY DISCHARGE PLAN: Attend aftercare/continuing care group Placement in alternative living arrangements  PATIENT/FAMIILY INVOLVEMENT: This treatment plan has been presented to and reviewed with the patient, Brandon [name] . The patient and family have been given the opportunity to ask questions and make suggestions.  Elenore Rota 03/01/2016, 4:35 PM

## 2016-03-01 NOTE — ED Notes (Signed)
Patient was given a ginger ale, coffee, and snack, and A regular diet ordered for lunch.

## 2016-03-02 ENCOUNTER — Encounter (HOSPITAL_COMMUNITY): Payer: Self-pay | Admitting: Psychiatry

## 2016-03-02 LAB — GLUCOSE, CAPILLARY
GLUCOSE-CAPILLARY: 99 mg/dL (ref 65–99)
GLUCOSE-CAPILLARY: 191 mg/dL — AB (ref 65–99)
GLUCOSE-CAPILLARY: 416 mg/dL — AB (ref 65–99)
GLUCOSE-CAPILLARY: 311 mg/dL — AB (ref 65–99)

## 2016-03-02 LAB — BASIC METABOLIC PANEL
ANION GAP: 9 (ref 5–15)
BUN: 19 mg/dL (ref 6–20)
CO2: 29 mmol/L (ref 22–32)
Calcium: 9.5 mg/dL (ref 8.9–10.3)
Chloride: 96 mmol/L — ABNORMAL LOW (ref 101–111)
Creatinine, Ser: 1.59 mg/dL — ABNORMAL HIGH (ref 0.61–1.24)
GFR, EST AFRICAN AMERICAN: 56 mL/min — AB (ref >60)
GFR, EST NON AFRICAN AMERICAN: 48 mL/min — AB (ref >60)
Glucose, Bld: 484 mg/dL — ABNORMAL HIGH (ref 65–99)
POTASSIUM: 4 mmol/L (ref 3.5–5.1)
SODIUM: 134 mmol/L — AB (ref 135–145)

## 2016-03-02 LAB — TSH: TSH: 1.339 u[IU]/mL (ref 0.350–4.500)

## 2016-03-02 MED ORDER — DULOXETINE HCL 60 MG PO CPEP
60.0000 mg | ORAL_CAPSULE | Freq: Every day | ORAL | Status: DC
  Administered 2016-03-02 – 2016-03-05 (×4): 60 mg via ORAL
  Filled 2016-03-02 (×5): qty 1

## 2016-03-02 MED ORDER — LEVETIRACETAM 500 MG PO TABS
500.0000 mg | ORAL_TABLET | Freq: Two times a day (BID) | ORAL | Status: DC
  Administered 2016-03-02 – 2016-03-05 (×7): 500 mg via ORAL
  Filled 2016-03-02 (×9): qty 1
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
## 2016-03-02 NOTE — Progress Notes (Signed)
Patient ID: Brandon [name], male   DOB: 12/20/1960, 55 y.o.   MRN: [id number] Patient's CBG was 416, MD Cobos notified. No new orders at this time.

## 2016-03-02 NOTE — Progress Notes (Signed)
Recreation Therapy Notes  Date: 03.29.2017 Time: 9:30am Location: [address] Group Room   Group Topic: Stress Management  Goal Area(s) Addresses:  Patient will actively participate in stress management techniques presented during session.   Behavioral Response: Did not attend.  Siniya Lichty L Brecken Walth, LRT/CTRS        Ayrton Mcvay L 03/02/2016 9:54 AM 

## 2016-03-02 NOTE — [facility] Group Notes (Signed)
[place] LCSW Group Therapy 03/02/2016  1:15 PM   Type of Therapy: Group Therapy  Participation Level: Did Not Attend. Patient invited to participate but declined.   Tilden Fossa, MSW, [employer] Clinical Social Worker [facility] [phone]
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
## 2016-03-02 NOTE — Progress Notes (Signed)
NUTRITION ASSESSMENT  Pt identified as at risk on the Malnutrition Screen Tool  INTERVENTION: 1. Educated patient on the importance of nutrition and encouraged intake of food and beverages. 2. Discussed weight goals. 3. Supplements: Continue Ensure Enlive BID   NUTRITION DIAGNOSIS: Unintentional weight loss related to sub-optimal intake as evidenced by pt report.   Goal: Pt to meet >/= 90% of their estimated nutrition needs.  Monitor:  PO intake  Assessment:  Pt admitted for SI and auditory hallucinations. Pt did not attend group last night. Per review, pt has lost 30 lbs (11% body weight) in the past 2 months which is significant for time frame. Ensure Jeanne Ivan has already been ordered BID; continue the same. If pt does not like this supplement please switch to Boost Breeze BID.  55 y.o. male  Height: Ht Readings from Last 1 Encounters:  03/01/16 6' 1.5" (1.867 m)    Weight: Wt Readings from Last 1 Encounters:  03/01/16 250 lb (113.399 kg)    Weight Hx: Wt Readings from Last 10 Encounters:  03/01/16 250 lb (113.399 kg)  02/18/16 255 lb (115.667 kg)  01/05/16 280 lb (127.007 kg)  10/19/15 274 lb (124.286 kg)  07/22/15 269 lb 3.2 oz (122.108 kg)  05/18/15 253 lb (114.76 kg)  04/23/15 261 lb (118.389 kg)  03/20/15 265 lb (120.203 kg)  12/23/14 164 lb (74.39 kg)  09/24/14 252 lb (114.306 kg)    BMI:  Body mass index is 32.53 kg/(m^2). Pt meets criteria for obesity based on current BMI.  Estimated Nutritional Needs: Kcal: 25-30 kcal/kg Protein: > 1 gram protein/kg Fluid: 1 ml/kcal  Diet Order: Diet Carb Modified Fluid consistency:: Thin; Room service appropriate?: Yes Pt is also offered choice of unit snacks mid-morning and mid-afternoon.  Pt is eating as desired.   Lab results and medications reviewed.     Brandon [name], RD, LDN Inpatient Clinical Dietitian Pager # [phone] After hours/weekend pager # [phone]
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
## 2016-03-02 NOTE — Progress Notes (Signed)
Patient ID: Brandon [name], male   DOB: 1960/12/17, 55 y.o.   MRN: [id number]  DAR: Pt. Denies SI/HI and A/V Hallucinations. He reports his sleep is fair, energy level is poor, energy level is hyper, and concentration is "fucking great." He rates depression, anxiety, and hopelessness 0/10. Patient does not report any pain or discomfort at this time. Support and encouragement provided to the patient. Scheduled medications administered to patient per physician's orders. Patient is receptive and cooperative. He is seen in the milieu and is interacting with his peers. He does slur his words and does stutters at times but is able to express his questions or concerns. Q15 minute checks are maintained for safety.

## 2016-03-02 NOTE — Tx Team (Signed)
Interdisciplinary Treatment Plan Update (Adult) Date: 03/02/2016    Time Reviewed: 9:30 AM  Progress in Treatment: Attending groups: Continuing to assess, patient new to milieu Participating in groups: Continuing to assess, patient new to milieu Taking medication as prescribed: Yes Tolerating medication: Yes Family/Significant other contact made: No, CSW assessing for appropriate contacts Patient understands diagnosis: Yes Discussing patient identified problems/goals with staff: Yes Medical problems stabilized or resolved: Yes Denies suicidal/homicidal ideation: Yes Issues/concerns per patient self-inventory: Yes Other:  New problem(s) identified: N/A  Discharge Plan or Barriers: CSW continuing to assess, patient new to milieu. Patient has no income, limited social supports, and is worried about being homeless at discharge.   Reason for Continuation of Hospitalization:  Depression Anxiety Medication Stabilization   Comments: N/A  Estimated length of stay: 3-5 days    Patient is a 55 year old male with a diagnosis of Major Depressive Disorder. Pt presented to the hospital with suicidal ideations and auditory and visual hallucinations. Pt reports primary trigger(s) for admission were financial, housing, and social. Patient will benefit from crisis stabilization, medication evaluation, group therapy and psycho education in addition to case management for discharge planning. At discharge, it is recommended that Pt remain compliant with established discharge plan and continued treatment.   Review of initial/current patient goals per problem list:  1. Goal(s): Patient will participate in aftercare plan   Met: No   Target date: 3-5 days post admission date   As evidenced by: Patient will participate within aftercare plan AEB aftercare provider and housing plan at discharge being identified.  3/29: Goal not met: CSW assessing for appropriate referrals for pt and will have  follow up secured prior to d/c.    2. Goal (s): Patient will exhibit decreased depressive symptoms and suicidal ideations.   Met: No   Target date: 3-5 days post admission date   As evidenced by: Patient will utilize self rating of depression at 3 or below and demonstrate decreased signs of depression or be deemed stable for discharge by MD.  3/29: Goal not met: Pt presents with flat affect and depressed mood.  Pt admitted with depression rating of 10.  Pt to show decreased sign of depression and a rating of 3 or less before d/c.     5. Goal(s): Patient will demonstrate decreased signs of psychosis  * Met: No  * Target date: 3-5 days post admission date  * As evidenced by: Patient will demonstrate decreased frequency of AVH or return to baseline function   3/29: Goal not met: Pt to take medication as prescribed to decrease psychosis to baseline.     Attendees: Patient:    Family:    Physician: Dr. Parke Poisson; Dr. Sabra Heck 03/02/2016 9:30 AM  Nursing: Eulogio Bear, Elesa Massed, Harland German, RN 03/02/2016 9:30 AM  Clinical Social Worker: Tilden Fossa, LCSW 03/02/2016 9:30 AM  Other: Peri Maris, LCSWA; [place], LCSW  03/02/2016 9:30 AM  Other:  03/02/2016 9:30 AM  Other:  03/02/2016 9:30 AM  Other: May Malachi Carl NP 03/02/2016 9:30 AM  Other:    Other:           Scribe for Treatment Team:  Tilden Fossa, [place]
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
## 2016-03-02 NOTE — Progress Notes (Signed)
Patient ID: Brandon [name], male   DOB: 07-Jul-1961, 55 y.o.   MRN: [id number]  Patient has now signed voluntary paperwork. MD Cobos aware. Patient would also like MD to call his "son's mother" at [phone] to discuss his care.

## 2016-03-02 NOTE — Plan of Care (Signed)
Problem: Alteration in thought process Goal: STG-Patient is able to discuss thoughts with staff Outcome: Progressing Brandon [name] is able to come to staff and discuss issues but does need prompting at times

## 2016-03-03 DIAGNOSIS — R739 Hyperglycemia, unspecified: Secondary | ICD-10-CM

## 2016-03-03 LAB — GLUCOSE, CAPILLARY
GLUCOSE-CAPILLARY: 314 mg/dL — AB (ref 65–99)
GLUCOSE-CAPILLARY: 254 mg/dL — AB (ref 65–99)
Glucose-Capillary: 154 mg/dL — ABNORMAL HIGH (ref 65–99)
Glucose-Capillary: 302 mg/dL — ABNORMAL HIGH (ref 65–99)

## 2016-03-03 MED ORDER — INSULIN DETEMIR 100 UNIT/ML ~~LOC~~ SOLN: 10.0000 [IU] | Freq: Two times a day (BID) | SUBCUTANEOUS | Status: DC

## 2016-03-03 MED ORDER — INSULIN DETEMIR 100 UNIT/ML ~~LOC~~ SOLN
15.0000 [IU] | Freq: Two times a day (BID) | SUBCUTANEOUS | Status: DC
  Administered 2016-03-03: 15 [IU] via SUBCUTANEOUS

## 2016-03-03 NOTE — Progress Notes (Signed)
[facility] MD Progress Note  03/03/2016 4:31 PM TOWNES FUHS  MRN:  [id number] Subjective:   Patient states that he is feeling " a little better today". At this time denies medication side effects. Objective : I have discussed case with treatment team and have met with patient . Patient remains depressed, but states he is feeling better, and reports feeling " safe" on the unit. He is very polite, pleasant and repeatedly states he thinks that being inpatient and feeling cared for by staff  is helping him feel better . He reports decreased anxiety. Reports frequent day time memories of traumatic event, but does not endorse nightmares and states he slept well last night  Denies any suicidal ideations at this time, contracts for safety on the unit .  Patient more visible on unit today, and as above, states he is feeling more comfortable and safer on unit today. No disruptive or agitated behaviors. Labs remarkable for Creatinine elevation to 1.59   Principal Problem: MDD (major depressive disorder), recurrent episode, severe ([place]) Diagnosis:   Patient Active Problem List   Diagnosis Date Noted  . MDD (major depressive disorder), recurrent episode, severe ([place]) [F33.2] 03/01/2016  . History of seizure [Z86.69] 01/05/2016  . History of stroke [Z86.73] 01/05/2016  . Essential hypertension [I10] 01/05/2016  . Type 2 diabetes mellitus with complication, with long-term current use of insulin ([place]) [E11.8, Z79.4] 01/05/2016  . Numbness [R20.0] 03/20/2015  . Other vascular headache [G44.1] 03/20/2015  . Depression due to stroke [facility]) [I63.9, F06.31] 09/21/2014  . 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 Acute ischemic stroke ([place]) [I63.9] 11/16/2013  . Chronic pain syndrome [G89.4] 06/27/2013  . Renal cell carcinoma ([place]) [C64.9] 03/15/2012  . Essential hypertension, benign [I10] 03/15/2012  . Type II or unspecified type diabetes mellitus without mention of complication, uncontrolled [E11.65]  03/15/2012  . Renal cell cancer ([place]) [C64.9] 12/30/2011  . Diverticulitis-history of [K57.92] 12/30/2011  . Abdominal pain [789.0] 12/30/2011   Total Time spent with patient: 20 minutes     Past Medical History:  Past Medical History  Diagnosis Date  . Hypertension   . Diverticulitis     s/p micorperforation Sept 2012-managed conservatively by Gen surgery  . Kidney tumor 09/2011    Renal cell CA  . Wears glasses   . ED (erectile dysfunction)   . 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 Acute ischemic stroke ([place]) 11/2013  . Focal seizure ([place]) 11/2013    due to ischemic stroke  . Diabetes mellitus     DKA prior hospitalization  . Depression     BH Adm in [facility] Depression    Past Surgical History  Procedure Laterality Date  . Kidney surgery      ablation of renal cell CA - 12/28, prior one was October 2012-Dr. Kathlene Cote  . Tee without cardioversion N/A 11/19/2013    Procedure: TRANSESOPHAGEAL ECHOCARDIOGRAM (TEE);  Surgeon: Lelon Perla, MD;  Location: [facility] ENDOSCOPY;  Service: Cardiovascular;  Laterality: N/A;   Family History:  Family History  Problem Relation Age of Onset  . Diabetes Father     Social History:  History  Alcohol Use No     History  Drug Use No    Social History   Social History  . Marital Status: Single    Spouse Name: N/A  . Number of Children: 2  . Years of Education: college   Occupational History  .  Social History Main Topics  . Smoking status: Never Smoker   . Smokeless tobacco: Never Used  . Alcohol Use: No  . Drug Use: No  . Sexual Activity: No   Other Topics Concern  . None   Social History Narrative   Patient lives at home with his family.   Education. Two years of college.   Not working.   Right handed.   Caffeine one soda daily.   Additional Social History:   Sleep: Good  Appetite:  Good  Current Medications: Current Facility-Administered Medications   Medication Dose Route Frequency Provider Last Rate Last Dose  . acetaminophen (TYLENOL) tablet 650 mg  650 mg Oral Q6H PRN Thermon Leyland, NP      . alum & mag hydroxide-simeth (MAALOX/MYLANTA) 200-200-20 MG/5ML suspension 30 mL  30 mL Oral Q4H PRN Thermon Leyland, NP      . DULoxetine (CYMBALTA) DR capsule 60 mg  60 mg Oral Daily Craige Cotta, MD   60 mg at 03/03/16 0839  . insulin aspart (novoLOG) injection 0-15 Units  0-15 Units Subcutaneous TID WC Thermon Leyland, NP   8 Units at 03/03/16 1202  . insulin aspart (novoLOG) injection 0-5 Units  0-5 Units Subcutaneous QHS Thermon Leyland, NP   4 Units at 03/02/16 2109  . insulin aspart (novoLOG) injection 4 Units  4 Units Subcutaneous TID WC Thermon Leyland, NP   4 Units at 03/03/16 [phone]  . levETIRAcetam (KEPPRA) tablet 500 mg  500 mg Oral BID Craige Cotta, MD   500 mg at 03/03/16 0839  . magnesium hydroxide (MILK OF MAGNESIA) suspension 30 mL  30 mL Oral Daily PRN Thermon Leyland, NP      . traZODone (DESYREL) tablet 50 mg  50 mg Oral QHS PRN Thermon Leyland, NP   50 mg at 03/02/16 2139    Lab Results:  Results for orders placed or performed during the hospital encounter of 03/01/16 (from the past 48 hour(s))  Glucose, capillary     Status: Abnormal   Collection Time: 03/01/16  8:55 PM  Result Value Ref Range   Glucose-Capillary 177 (H) 65 - 99 mg/dL  Glucose, capillary     Status: None   Collection Time: 03/02/16  6:16 AM  Result Value Ref Range   Glucose-Capillary 99 65 - 99 mg/dL  Glucose, capillary     Status: Abnormal   Collection Time: 03/02/16 11:43 AM  Result Value Ref Range   Glucose-Capillary 191 (H) 65 - 99 mg/dL  Glucose, capillary     Status: Abnormal   Collection Time: 03/02/16  4:56 PM  Result Value Ref Range   Glucose-Capillary 416 (H) 65 - 99 mg/dL  Basic metabolic panel     Status: Abnormal   Collection Time: 03/02/16  6:15 PM  Result Value Ref Range   Sodium 134 (L) 135 - 145 mmol/L   Potassium 4.0 3.5 - 5.1 mmol/L    Chloride 96 (L) 101 - 111 mmol/L   CO2 29 22 - 32 mmol/L   Glucose, Bld 484 (H) 65 - 99 mg/dL   BUN 19 6 - 20 mg/dL   Creatinine, Ser 7.19 (H) 0.61 - 1.24 mg/dL   Calcium 9.5 8.9 - 59.7 mg/dL   GFR calc non Af Amer 48 (L) >60 mL/min   GFR calc Af Amer 56 (L) >60 mL/min    Comment: (NOTE) The eGFR has been calculated using the CKD EPI equation. This calculation has not  been validated in all clinical situations. eGFR's persistently <60 mL/min signify possible Chronic Kidney Disease.    Anion gap 9 5 - 15    Comment: Performed at [facility]  TSH     Status: None   Collection Time: 03/02/16  6:15 PM  Result Value Ref Range   TSH 1.339 0.350 - 4.500 uIU/mL    Comment: Performed at [facility]  Glucose, capillary     Status: Abnormal   Collection Time: 03/02/16  8:23 PM  Result Value Ref Range   Glucose-Capillary 311 (H) 65 - 99 mg/dL  Glucose, capillary     Status: Abnormal   Collection Time: 03/03/16  6:21 AM  Result Value Ref Range   Glucose-Capillary 314 (H) 65 - 99 mg/dL  Glucose, capillary     Status: Abnormal   Collection Time: 03/03/16 11:44 AM  Result Value Ref Range   Glucose-Capillary 254 (H) 65 - 99 mg/dL    Blood Alcohol level:  Lab Results  Component Value Date   ETH <5 02/29/2016   ETH <5 01/08/2016    Physical Findings: AIMS: Facial and Oral Movements Muscles of Facial Expression: None, normal Lips and Perioral Area: None, normal Jaw: None, normal Tongue: None, normal,Extremity Movements Upper (arms, wrists, hands, fingers): None, normal Lower (legs, knees, ankles, toes): None, normal, Trunk Movements Neck, shoulders, hips: None, normal, Overall Severity Severity of abnormal movements (highest score from questions above): None, normal Incapacitation due to abnormal movements: None, normal Patient's awareness of abnormal movements (rate only patient's report): No Awareness, Dental Status Current problems with  teeth and/or dentures?: No Does patient usually wear dentures?: No  CIWA:    COWS:     Musculoskeletal: Strength & Muscle Tone: neurologic sequelae of CVA  Gait & Station: walks slowly  Patient leans: N/A  Psychiatric Specialty Exam: ROS denies headache, denies chest pain, denies shortness of breath, no vomiting   Blood pressure 107/74, pulse 95, temperature 98.6 F (37 C), temperature source Oral, resp. rate 16, height 6' 1.5" (1.867 m), weight 250 lb (113.399 kg), SpO2 99 %.Body mass index is 32.53 kg/(m^2).  General Appearance: Fairly Groomed  Engineer, water::  Good  Speech:  Normal Rate- clearer today, less dysarthric, at times stutters briefly   Volume:  Normal  Mood:  less depressed than on admission  Affect:  constricted, anxious, but to a lesser degree than on admission  Thought Process:  Linear  Orientation:  Other:  fully alert and attentive   Thought Content:  denies hallucinations , no delusions   Suicidal Thoughts:  No at this time denies any suicidal ideations, and denies any self injurious ideations   Homicidal Thoughts:  No denies any homicidal or violent ideations   Memory:  recent and remote grossly intact   Judgement:  Fair  Insight:  Fair  Psychomotor Activity:  Normal, patient has occasional facial spasms, tremors, particularly when anxious , states that this is a consequence of his CVA, head trauma, and states it is not new   Concentration:  Good  Recall:  Good  Fund of Knowledge:Good  Language: Good  Akathisia:  Negative  Handed:  Right  AIMS (if indicated):     Assets:  Desire for Improvement Resilience  ADL's:  Intact  Cognition: WNL  Sleep:  Number of Hours: 6.5  Assessment - patient remains depressed, but  presenting with partially improved mood and range of affect, and less anxiety. Today denies suicidal ideations. At this time tolerating  Cymbalta trial well . Creatinine serum level has increased , currently 1.59 Treatment Plan Summary: Daily  contact with patient to assess and evaluate symptoms and progress in treatment, Medication management, Plan inpatient admission and medications as below  Continue to encourage group, milieu participation to work on coping skills and symptom reduction Continue Cymbalta 60 mgrs QDAY  for depression, anxiety, PTSD  Continue Trazodone 50 mgrs QHS PRN for insomnia as needed Continue Keppra 500 mgrs BID for history of seizures  Requested Hospitalist Consult regarding DM management and Creatinine level increase  Juwann Sherk, Felicita Gage, MD 03/03/2016, 4:31 PM

## 2016-03-03 NOTE — Progress Notes (Signed)
Patient ID: Brandon [name], male   DOB: March 03, 1961, 55 y.o.   MRN: [id number]  Pt currently presents with a flat affect and depressed behavior. Per self inventory, pt rates depression at a 7, hopelessness 0 and anxiety 2-3. Pt's daily goal is to "I got this" and they intend to do so by "I no what to do." Pt reports fair sleep, a fair appetite and low energy. Pt reports being upset by another pt this morning in the breakfast line.   Pt provided with medications per providers orders. Pt's labs and vitals were monitored throughout the day. Pt supported emotionally and encouraged to express concerns and questions. Pt educated on medications. Pt encouraged to interact with other pts on the unit. Pt given insulin sliding scale today, pt refuses to attend lunch or eat. Pt verbally agrees to notify writer if symptoms of hypoglycemia occur, carb snack by bed in case.   Pt's safety ensured with 15 minute and environmental checks. Pt currently denies SI/HI and A/V hallucinations. Pt verbally agrees to seek staff if SI/HI or A/VH occurs and to consult with staff before acting on these thoughts. Will continue POC.

## 2016-03-03 NOTE — NC FL2 (Signed)
[place] LEVEL OF CARE SCREENING TOOL     IDENTIFICATION  Patient Name: Brandon [name] Birthdate: 06-05-1961 Sex: male Admission Date (Current Location): 03/01/2016  [place] and [place] Number:  Kathleen Argue [id number] [place] and Address:   [facility] [address] [place] [address])      Provider Number: [id number]  Attending Physician Name and Address:  Jenne Campus, MD  Relative Name and Phone Number:       Current Level of Care: Hospital Recommended Level of Care: [place], [place] Prior Approval Number:    Date Approved/Denied:   PASRR Number:    Discharge Plan:      Current Diagnoses: Patient Active Problem List   Diagnosis Date Noted  . MDD (major depressive disorder), recurrent episode, severe ([place]) 03/01/2016  . History of seizure 01/05/2016  . History of stroke 01/05/2016  . Essential hypertension 01/05/2016  . Type 2 diabetes mellitus with complication, with long-term current use of insulin ([place]) 01/05/2016  . Numbness 03/20/2015  . Other vascular headache 03/20/2015  . Depression due to stroke ([place]) 09/21/2014  . 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 Acute ischemic stroke ([place]) 11/16/2013  . Chronic pain syndrome 06/27/2013  . Renal cell carcinoma ([place]) 03/15/2012  . Essential hypertension, benign 03/15/2012  . Type II or unspecified type diabetes mellitus without mention of complication, uncontrolled 03/15/2012  . Renal cell cancer ([place]) 12/30/2011  . Diverticulitis-history of 12/30/2011  . Abdominal pain 12/30/2011    Orientation RESPIRATION BLADDER Height & Weight     Self, Time, Situation, Place  Normal Continent Weight: 250 lb (113.399 kg) Height:  6' 1.5" (186.7 cm)  BEHAVIORAL SYMPTOMS/MOOD NEUROLOGICAL BOWEL NUTRITION STATUS    Convulsions/Seizures Continent Diet  AMBULATORY STATUS COMMUNICATION OF NEEDS Skin   Independent Verbally Other (Comment) (cracked skin on foot)                       Personal Care Assistance Level of Assistance              Functional Limitations Info  Speech     Speech Info: Impaired    SPECIAL CARE FACTORS FREQUENCY                       Contractures Contractures Info: Not present    Additional Factors Info  Psychotropic, Insulin Sliding Scale, Allergies   Allergies Info: No known allergies  Psychotropic Info: Cymbalta, Trazodone Insulin Sliding Scale Info: insulin aspart (novoLOG) injection        Current Medications (03/03/2016):  This is the current hospital active medication list Current Facility-Administered Medications  Medication Dose Route Frequency Provider Last Rate Last Dose  . acetaminophen (TYLENOL) tablet 650 mg  650 mg Oral Q6H PRN Niel Hummer, NP      . alum & mag hydroxide-simeth (MAALOX/MYLANTA) 200-200-20 MG/5ML suspension 30 mL  30 mL Oral Q4H PRN Niel Hummer, NP      . DULoxetine (CYMBALTA) DR capsule 60 mg  60 mg Oral Daily Jenne Campus, MD   60 mg at 03/03/16 0839  . insulin aspart (novoLOG) injection 0-15 Units  0-15 Units Subcutaneous TID WC Niel Hummer, NP   8 Units at 03/03/16 1202  . insulin aspart (novoLOG) injection 0-5 Units  0-5 Units Subcutaneous QHS Niel Hummer, NP   4 Units at 03/02/16 2109  . insulin aspart (novoLOG) injection 4 Units  4 Units Subcutaneous TID  WC Niel Hummer, NP   4 Units at 03/03/16 [phone]  . levETIRAcetam (KEPPRA) tablet 500 mg  500 mg Oral BID Jenne Campus, MD   500 mg at 03/03/16 0839  . magnesium hydroxide (MILK OF MAGNESIA) suspension 30 mL  30 mL Oral Daily PRN Niel Hummer, NP      . traZODone (DESYREL) tablet 50 mg  50 mg Oral QHS PRN Niel Hummer, NP   50 mg at 03/02/16 2139     Discharge Medications: Please see discharge summary for a list of discharge medications.  Relevant Imaging Results:  Relevant Lab Results:   Additional Information    Ed Mandich, Casimiro Needle, LCSW

## 2016-03-03 NOTE — BHH Group Notes (Signed)
[place] LCSW Group Therapy 03/03/2016  1:15 PM   Type of Therapy: Group Therapy  Participation Level: Did Not Attend. Patient invited to participate but declined.   Tilden Fossa, MSW, [place] Clinical Social Worker [facility] [phone]

## 2016-03-03 NOTE — Progress Notes (Addendum)
Patient ID: Brandon [name], male   DOB: 1961/02/12, 56 y.o.   MRN: [id number]   Adult Psychoeducational Group Note  Date:  03/03/2016 Time:  09:00am  Group Topic/Focus:  Making Healthy Choices:   The focus of this group is to help patients identify negative/unhealthy choices they were using prior to admission and identify positive/healthier coping strategies to replace them upon discharge.  Participation Level:  Did Not Attend  Participation Quality: n/a  Affect: n/a  Cognitive: n/a  Insight: n/a  Engagement in Group: n/a  Modes of Intervention:  Discussion, Education and Support  Additional Comments:    Elenore Rota 03/03/2016, 9:47 AM

## 2016-03-03 NOTE — Progress Notes (Signed)
Pt reports he is having a good evening.  He is observed in the dayroom getting coffee and interacting with his peers.  He denies SI/HI/AVH.  His speech is slurred and difficulty at times to understand d/t effects of a stroke in 2016.  He is pleasant and cooperative with Probation officer and appreciative of staff help.  Writer discussed his available medication for the evening and pt voices understanding.  Writer discussed his CBG levels and reminded pt of healthy food choices.  Support and encouragement offered.  Discharge plans are in process.  Safety maintained with q15 minute checks.

## 2016-03-03 NOTE — Consult Note (Signed)
[facility] Medical Consultation  Brandon [name] [id number] DOB: 27-Dec-1960 DOA: 03/01/2016 PCP: [name], PA-C   Requesting physician: Dr. Parke Poisson Date of consultation: 3.30.2017 Reason for consultation: [place].  Impression/Recommendations Principal Problem:   MDD (major depressive disorder), recurrent episode, severe ([place]) Active Problems:   AKI (acute kidney injury) ([place])   Hyperglycemia His baseline creatinine before coming into the hospital last month was less than 1. Now 1.5 on admission. His acute kidney injury might be due to his high blood glucose, which is causing the state of prerenal azotemia. We'll start him on long-acting insulin, I have encouraged him to drink water. We'll check CBGs before meals and at bedtime and continue sliding scale insulin. We'll recheck a basic metabolic panel daily. He is not on any nephrotoxic drugs, has no rashes or joint pain.   Chief Complaint: AKI  HPI:  55 year old male with past medical history of diabetes mellitus, history of stroke and seizure disorder came into behavioral health for major depressive disorder, on admission he had a mild increase in creatinine so we were consulted as his creatinine did not improve. He relates he has been having polyuria and polydipsia, no rashes or joint pain. No new medications.   Review of Systems:  Constitutional:  No weight loss, night sweats, Fevers, chills, fatigue.  HEENT:  No headaches, Difficulty swallowing,Tooth/dental problems,Sore throat,  No sneezing, itching, ear ache, nasal congestion, post nasal drip,  Cardio-vascular:  No chest pain, Orthopnea, PND, swelling in lower extremities, anasarca, dizziness, palpitations  GI:  No heartburn, indigestion, abdominal pain, nausea, vomiting, diarrhea, change in bowel habits, loss of appetite  Resp:  No shortness of breath with exertion or at rest. No excess mucus, no productive cough, No non-productive cough, No  coughing up of blood.No change in color of mucus.No wheezing.No chest wall deformity  Skin:  no rash or lesions.  GU:  no dysuria, change in color of urine, no urgency or frequency. No flank pain.  Musculoskeletal:  No joint pain or swelling. No decreased range of motion. No back pain.  Psych:  No change in mood or affect. No depression or anxiety. No memory loss.   Past Medical History  Diagnosis Date  . Hypertension   . Diverticulitis     s/p micorperforation Sept 2012-managed conservatively by Gen surgery  . Kidney tumor 09/2011    Renal cell CA  . Wears glasses   . ED (erectile dysfunction)   . 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 Acute ischemic stroke ([place]) 11/2013  . Focal seizure ([place]) 11/2013    due to ischemic stroke  . Diabetes mellitus     DKA prior hospitalization  . Depression     BH Adm in [facility] Depression   Past Surgical History  Procedure Laterality Date  . Kidney surgery      ablation of renal cell CA - 12/28, prior one was October 2012-Dr. Kathlene Cote  . Tee without cardioversion N/A 11/19/2013    Procedure: TRANSESOPHAGEAL ECHOCARDIOGRAM (TEE);  Surgeon: Lelon Perla, MD;  Location: [facility] ENDOSCOPY;  Service: Cardiovascular;  Laterality: N/A;   Social History:  reports that he has never smoked. He has never used smokeless tobacco. He reports that he does not drink alcohol or use illicit drugs.  No Known Allergies Family History  Problem Relation Age of Onset  . Diabetes Father     Prior to Admission medications   Medication Sig Start Date  End Date Taking? Authorizing Provider  aspirin 325 MG tablet Take 1 tablet (325 mg total) by mouth daily. Patient not taking: Reported on 02/29/2016 04/23/15   Camelia Eng Tysinger, PA-C  atorvastatin (LIPITOR) 40 MG tablet Take 1 tablet (40 mg total) by mouth daily. Patient not taking: Reported on 02/29/2016 01/05/16   Camelia Eng Tysinger, PA-C  Canagliflozin-Metformin HCl (INVOKAMET)  50-1000 MG TABS Take 1 tablet by mouth 2 (two) times daily. Patient not taking: Reported on 02/18/2016 01/05/16   Camelia Eng Tysinger, PA-C  DULoxetine (CYMBALTA) 60 MG capsule Take 1 capsule (60 mg total) by mouth 2 (two) times daily. Patient not taking: Reported on 02/29/2016 01/05/16   Camelia Eng Tysinger, PA-C  levETIRAcetam (KEPPRA) 500 MG tablet Take 1 tablet (500 mg total) by mouth 2 (two) times daily. Patient not taking: Reported on 02/29/2016 01/05/16   Carlena Hurl, PA-C   Physical Exam: Blood pressure 107/74, pulse 95, temperature 98.6 F (37 C), temperature source Oral, resp. rate 16, height 6' 1.5" (1.867 m), weight 113.399 kg (250 lb), SpO2 99 %. Filed Vitals:   03/02/16 0611 03/02/16 0612  BP: 125/78 107/74  Pulse: 78 95  Temp: 98.6 F (37 C)   Resp: 16      General:  He is awake alert oriented 3  Eyes: Anicteric no pallor  ENT: Moist mucous membranes  Neck: Trachea is midline  Cardiovascular: Regular rate and rhythm appreciated murmurs  Respiratory: Good air movement and clear to auscultation  Abdomen: Bowel sounds nontender nondistended and soft  Skin: Darkening of his skin in the facial area laterally to the eyes bilaterally  Musculoskeletal: Intact  Psychiatric: Appropriate  Neurologic: Nonfocal  Labs on Admission:  Basic Metabolic Panel:  Recent Labs Lab 02/29/16 1320 03/02/16 1815  NA 135 134*  K 5.3* 4.0  CL 95* 96*  CO2 27 29  GLUCOSE 579* 484*  BUN 13 19  CREATININE 1.40* 1.59*  CALCIUM 10.3 9.5   Liver Function Tests:  Recent Labs Lab 02/29/16 1320  AST 17  ALT 21  ALKPHOS 147*  BILITOT 1.6*  PROT 8.7*  ALBUMIN 4.7   No results for input(s): LIPASE, AMYLASE in the last 168 hours. No results for input(s): AMMONIA in the last 168 hours. CBC:  Recent Labs Lab 02/29/16 1320  WBC 6.4  HGB 14.8  HCT 41.2  MCV 85.5  PLT 245   Cardiac Enzymes: No results for input(s): CKTOTAL, CKMB, CKMBINDEX, TROPONINI in the last 168  hours. BNP: Invalid input(s): POCBNP CBG:  Recent Labs Lab 03/02/16 1143 03/02/16 1656 03/02/16 2023 03/03/16 0621 03/03/16 1144  GLUCAP 191* 416* 311* 314* 254*    Radiological Exams on Admission: No results found.  EKG: Independently reviewed. none  Time spent: 85 min  Charlynne Cousins [facility] Pager [phone]  If 7PM-7AM, please contact night-coverage [email or web address] Password [facility] 03/03/2016, 4:56 PM

## 2016-03-04 DIAGNOSIS — E1165 Type 2 diabetes mellitus with hyperglycemia: Secondary | ICD-10-CM

## 2016-03-04 DIAGNOSIS — F332 Major depressive disorder, recurrent severe without psychotic features: Principal | ICD-10-CM

## 2016-03-04 DIAGNOSIS — N179 Acute kidney failure, unspecified: Secondary | ICD-10-CM

## 2016-03-04 LAB — BASIC METABOLIC PANEL
ANION GAP: 8 (ref 5–15)
BUN: 17 mg/dL (ref 6–20)
CALCIUM: 9.4 mg/dL (ref 8.9–10.3)
CO2: 29 mmol/L (ref 22–32)
Chloride: 101 mmol/L (ref 101–111)
Creatinine, Ser: 1.07 mg/dL (ref 0.61–1.24)
Glucose, Bld: 302 mg/dL — ABNORMAL HIGH (ref 65–99)
POTASSIUM: 4.6 mmol/L (ref 3.5–5.1)
Sodium: 138 mmol/L (ref 135–145)

## 2016-03-04 LAB — GLUCOSE, CAPILLARY
GLUCOSE-CAPILLARY: 383 mg/dL — AB (ref 65–99)
GLUCOSE-CAPILLARY: 241 mg/dL — AB (ref 65–99)
Glucose-Capillary: 271 mg/dL — ABNORMAL HIGH (ref 65–99)
Glucose-Capillary: 485 mg/dL — ABNORMAL HIGH (ref 65–99)

## 2016-03-04 MED ORDER — INSULIN DETEMIR 100 UNIT/ML ~~LOC~~ SOLN: 17.0000 [IU] | Freq: Two times a day (BID) | SUBCUTANEOUS | Status: DC

## 2016-03-04 MED ORDER — INSULIN DETEMIR 100 UNIT/ML ~~LOC~~ SOLN
22.0000 [IU] | Freq: Two times a day (BID) | SUBCUTANEOUS | Status: AC
  Administered 2016-03-04 (×2): 22 [IU] via SUBCUTANEOUS

## 2016-03-04 MED ORDER — INSULIN ASPART PROT & ASPART (70-30 MIX) 100 UNIT/ML ~~LOC~~ SUSP: 30.0000 [IU] | Freq: Two times a day (BID) | SUBCUTANEOUS | Status: DC
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
## 2016-03-04 NOTE — Progress Notes (Signed)
Patient ID: SEGIO CARTER, male   DOB: 06/18/1961, 55 y.o.   MRN: [id number]   Pt currently presents with labile affect and behavior. Per self inventory, pt rates depression, hopelessness and anxiety at a 0. Pt's daily goal is to "stay positive, not talk to myself and keep preaching out for help" and they intend to do so by "keep driving no matter what, keep going." Pt reports "thankful all of them, there so awesome, thank you all." Pt reports good sleep, a good appetite, high energy and good concentration.   Pt provided with medications per providers orders. Pt's labs and vitals were monitored throughout the day. Pt supported emotionally and encouraged to express concerns and questions. Pt educated on medications and carb modified diet. Continuing to monitor pts CBG levels, high level noted this pm. MD and NP notified, pt reports outside doctor to "change the long acting" tomorrow.   Pt's safety ensured with 15 minute and environmental checks. Pt currently denies SI/HI and A/V hallucinations. Pt verbally agrees to seek staff if SI/HI or A/VH occurs and to consult with staff before acting on these thoughts. Pt reports in reference to his discharge that "meaning working hard to make it, I thank, got 2 days left going to work hard, ok." Will continue POC.

## 2016-03-04 NOTE — Progress Notes (Signed)
Recreation Therapy Notes  Date: 03.31.2017 Time: 9:30am Location: [address] Group Room   Group Topic: Stress Management  Goal Area(s) Addresses:  Patient will actively participate in stress management techniques presented during session.   Behavioral Response: Did not attend.   Laureen Ochs Christropher Gintz, LRT/CTRS        Sheniece Ruggles L 03/04/2016 2:05 PM

## 2016-03-04 NOTE — Progress Notes (Signed)
Adult Psychoeducational Group Note  Date:  03/04/2016 Time:  8:53 PM  Group Topic/Focus:  Wrap-Up Group:   The focus of this group is to help patients review their daily goal of treatment and discuss progress on daily workbooks.  Participation Level:  Did Not Attend  Participation Quality:  Did not attend  Affect:  Did not attend  Cognitive:  Did not attend  Insight: None  Engagement in Group:  Did not attend  Modes of Intervention:  Did not attend  Additional Comments:  Patient did not attend wrap up group tonight.   Millicent Blazejewski L Laryn Venning 03/04/2016, 8:53 PM

## 2016-03-04 NOTE — Progress Notes (Signed)
Patient ID: Brandon [name], male   DOB: [birth year], 55 y.o.   MRN: [id number] D: Client visible on unit, seen on the phone, interacting with peers and staff. Client reports "I giving words of encouragement" Client stutters, makes gestures with hands as he speaks, voice sometimes loud, but non threatening. A: Writer encouraged client to continue being positive, report concerns to staff. Staff will monitor q76min for safety. R: client is safe on the unit.

## 2016-03-04 NOTE — BHH Group Notes (Signed)
[facility] LCSW Group Therapy 03/04/2016 1:15pm  Type of Therapy: Group Therapy- Feelings Around Relapse and Recovery  Participation Level: Active   Participation Quality:  Appropriate  Affect:  Appropriate  Cognitive: Alert and Oriented   Insight:  Developing   Engagement in Therapy: Developing/Improving and Engaged   Modes of Intervention: Clarification, Confrontation, Discussion, Education, Exploration, Limit-setting, Orientation, Problem-solving, Rapport Building, Art therapist, Socialization and Support  Summary of Progress/Problems: The topic for today was feelings about relapse. The group discussed what relapse prevention is to them and identified triggers that they are on the path to relapse. Members also processed their feeling towards relapse and were able to relate to common experiences. Group also discussed coping skills that can be used for relapse prevention.  Pt participated actively; could be monopolizing at times, however was easily redirected. He emphasized the importance of having a support group that has a positive and encouraging attitude in relapse prevention. He also encouraged peers and related to their stories.   Therapeutic Modalities:   Cognitive Behavioral Therapy Solution-Focused Therapy Assertiveness Training Relapse Prevention Therapy    Norman Clay [id number] 03/04/2016 5:16 PM

## 2016-03-04 NOTE — BHH Group Notes (Signed)
[facility] LCSW Aftercare Discharge Planning Group Note  03/04/2016 8:45 AM  Participation Quality: Alert, Appropriate and Oriented  Mood/Affect: Appropriate  Depression Rating: 0  Anxiety Rating: 2-3  Thoughts of Suicide: Pt denies SI/HI  Will you contract for safety? Yes  Current AVH: Pt denies  Plan for Discharge/Comments: Pt attended discharge planning group and actively participated in group. CSW discussed suicide prevention education with the group and encouraged them to discuss discharge planning and any relevant barriers. Pt focused on "the state giving his money." He describes May as a hard month for him and that he is anxious about how he will handle that time.  Transportation Means: Pt reports access to transportation  Supports: No supports mentioned at this time  Peri Maris, [place] 03/04/2016 9:23 AM

## 2016-03-04 NOTE — Progress Notes (Signed)
Patient Demographics  Brandon [name], is a 55 y.o. male, DOB - November 02, 1961, [id number]  Admit date - 03/01/2016   Admitting Physician Jenne Campus, MD  Outpatient Primary MD for the patient is TYSINGER, DAVID SHANE, PA-C  LOS - 3   No chief complaint on file.      Subjective:   Brandon [name] today has, No headache, No chest pain, No abdominal pain - No Nausea, No Cough - SOB.   Assessment & Plan    Principal Problem:   Uncontrolled diabetes mellitus type 2 without complications ([place]) Active Problems:   MDD (major depressive disorder), recurrent episode, severe ([place])   AKI (acute kidney injury) ([place])   Hyperglycemia   Severe episode of recurrent major depressive disorder, without psychotic features ([place])  Acute renal failure - most likely prerenal azotemia in the Setting of decreased by mouth uncontrolled diabetes mellitus, currently resolved after patient encouraged to increase his fluid intake, creatinine is 1.07 this a.m..  Diabetes mellitus, type II, uncontrolled - Patient home medication list indicates he was on Invokanamet patient denies that, reports he was on Novolin 70/3040 units subcutaneous twice a day, reports he doesn't use Lantus anymore as it is not covered by his insurance. - CBG uncontrolled, increase his Levemir dose today, I will transition him to Novolin 70/30 tomorrow a.m., to continue with insulin sliding-scale coverage so would know which dose to discharge and on when he is ready. - Check hemoglobin A1c  Recurrent major depressive disorder without psychotic feature - Managed by psychiatry    We will continue to follow tomorrow  Medications  Scheduled Meds: . DULoxetine  60 mg Oral Daily  . insulin aspart  0-15 Units Subcutaneous TID WC  . insulin aspart  0-5 Units Subcutaneous QHS  . insulin aspart  4 Units Subcutaneous TID WC  . insulin detemir  22  Units Subcutaneous BID  . levETIRAcetam  500 mg Oral BID   Continuous Infusions:  PRN Meds:.acetaminophen, alum & mag hydroxide-simeth, magnesium hydroxide, traZODone    Lab Results  Component Value Date   PLT 245 02/29/2016    Antibiotics    Anti-infectives    None          Objective:   Filed Vitals:   03/02/16 0611 03/02/16 0612 03/04/16 0700 03/04/16 0901  BP: 125/78 107/74 152/94 138/87  Pulse: 78 95 68 87  Temp: 98.6 F (37 C)  98.7 F (37.1 C)   TempSrc: Oral  Oral   Resp: 16  18   Height:      Weight:      SpO2:        Wt Readings from Last 3 Encounters:  03/01/16 113.399 kg (250 lb)  02/18/16 115.667 kg (255 lb)  01/05/16 127.007 kg (280 lb)    No intake or output data in the 24 hours ending 03/04/16 1529   Physical Exam  Awake Alert, Oriented X 3, No new F.N deficits, Normal affect [place].AT,PERRAL Supple Neck,No JVD, No cervical lymphadenopathy appriciated.  Symmetrical Chest wall movement, Good air movement bilaterally, CTAB RRR,No Gallops,Rubs or new Murmurs, No Parasternal Heave +ve B.Sounds, Abd Soft, No tenderness, No organomegaly appriciated, No rebound - guarding or rigidity. No Cyanosis, Clubbing or edema, No new Rash or  bruise     Data Review   Micro Results No results found for this or any previous visit (from the past 240 hour(s)).  Radiology Reports No results found.   CBC  Recent Labs Lab 02/29/16 1320  WBC 6.4  HGB 14.8  HCT 41.2  PLT 245  MCV 85.5  MCH 30.7  MCHC 35.9  RDW 11.6    Chemistries   Recent Labs Lab 02/29/16 1320 03/02/16 1815 03/04/16 0630  NA 135 134* 138  K 5.3* 4.0 4.6  CL 95* 96* 101  CO2 27 29 29   GLUCOSE 579* 484* 302*  BUN 13 19 17   CREATININE 1.40* 1.59* 1.07  CALCIUM 10.3 9.5 9.4  AST 17  --   --   ALT 21  --   --   ALKPHOS 147*  --   --   BILITOT 1.6*  --   --     ------------------------------------------------------------------------------------------------------------------ estimated creatinine clearance is 107.4 mL/min (by C-G formula based on Cr of 1.07). ------------------------------------------------------------------------------------------------------------------ No results for input(s): HGBA1C in the last 72 hours. ------------------------------------------------------------------------------------------------------------------ No results for input(s): CHOL, HDL, LDLCALC, TRIG, CHOLHDL, LDLDIRECT in the last 72 hours. ------------------------------------------------------------------------------------------------------------------  Recent Labs  03/02/16 1815  TSH 1.339   ------------------------------------------------------------------------------------------------------------------ No results for input(s): VITAMINB12, FOLATE, FERRITIN, TIBC, IRON, RETICCTPCT in the last 72 hours.  Coagulation profile No results for input(s): INR, PROTIME in the last 168 hours.  No results for input(s): DDIMER in the last 72 hours.  Cardiac Enzymes No results for input(s): CKMB, TROPONINI, MYOGLOBIN in the last 168 hours.  Invalid input(s): CK ------------------------------------------------------------------------------------------------------------------ Invalid input(s): POCBNP     Time Spent in minutes   30 minutes   Brandon [name] M.D on 03/04/2016 at 3:29 PM  Between 7am to 7pm - Pager - [phone]  After 7pm go to [email or web address] - password [facility]  [facility]   Office  [phone]

## 2016-03-05 LAB — GLUCOSE, CAPILLARY: GLUCOSE-CAPILLARY: 74 mg/dL (ref 65–99)

## 2016-03-05 MED ORDER — DULOXETINE HCL 60 MG PO CPEP: 60.0000 mg | ORAL_CAPSULE | Freq: Every day | ORAL | Status: DC

## 2016-03-05 MED ORDER — LEVETIRACETAM 500 MG PO TABS: 500.0000 mg | ORAL_TABLET | Freq: Two times a day (BID) | ORAL | Status: DC

## 2016-03-05 MED ORDER — TRAZODONE HCL 50 MG PO TABS: 50.0000 mg | ORAL_TABLET | Freq: Every evening | ORAL | Status: DC | PRN

## 2016-03-05 MED ORDER — INSULIN ASPART PROT & ASPART (70-30 MIX) 100 UNIT/ML ~~LOC~~ SUSP
28.0000 [IU] | Freq: Two times a day (BID) | SUBCUTANEOUS | Status: DC
  Administered 2016-03-05: 28 [IU] via SUBCUTANEOUS

## 2016-03-05 NOTE — Progress Notes (Signed)
  [facility] Adult Case Management Discharge Plan :  Will you be returning to the same living situation after discharge:  Yes,  patient refuses to share where he will live At discharge, do you have transportation home?: Yes,  to be picked up Do you have the ability to pay for your medications: No.  Release of information consent forms completed and in the chart;  Patient's signature needed at discharge.  Patient to Follow up at: Follow-up Information    Follow up with Patient refuses hospital's offer to set up aftercare, states he will do this himself..      Next level of care provider has access to [place] and Suicide Prevention discussed: No.  Patient refuses to participate in Suicide Prevention Education and refuses to sign consent for CSW to talk to anyone in his life.  Have you used any form of tobacco in the last 30 days? (Cigarettes, Smokeless Tobacco, Cigars, and/or Pipes): No  Has patient been referred to the Quitline?: Patient refused referral  He did not say he is a nonsmoker, simply refused referral.  Patient has been referred for addiction treatment: Pt. refused referral  Lysle Dingwall 03/05/2016, 12:28 PM

## 2016-03-05 NOTE — BHH Suicide Risk Assessment (Signed)
[place] INPATIENT:  Family/Significant Other Suicide Prevention Education  Suicide Prevention Education:  Patient Refusal for Family/Significant Other Suicide Prevention Education: The patient Brandon [name] has refused to provide written consent for family/significant other to be provided Family/Significant Other Suicide Prevention Education during admission and/or prior to discharge.  Patient refuses to go over the Suicide Prevention Brochure with CSW also.   Physician notified.  Lysle Dingwall 03/05/2016, 12:27 PM
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
## 2016-03-05 NOTE — BHH Suicide Risk Assessment (Signed)
[facility] Discharge Suicide Risk Assessment   Principal Problem: Severe episode of recurrent major depressive disorder, without psychotic features [facility]) Discharge Diagnoses:  Patient Active Problem List   Diagnosis Date Noted  . AKI (acute kidney injury) ([place]) [N17.9] 03/03/2016  . Severe episode of recurrent major depressive disorder, without psychotic features ([place]) [F33.2]   . History of seizure [Z86.69] 01/05/2016  . History of stroke [Z86.73] 01/05/2016  . Essential hypertension [I10] 01/05/2016  . Type 2 diabetes mellitus with complication, with long-term current use of insulin ([place]) [E11.8, Z79.4] 01/05/2016  . Numbness [R20.0] 03/20/2015  . Other vascular headache [G44.1] 03/20/2015  . 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 Acute ischemic stroke ([place]) [I63.9] 11/16/2013  . Chronic pain syndrome [G89.4] 06/27/2013  . Renal cell carcinoma ([place]) [C64.9] 03/15/2012  . Essential hypertension, benign [I10] 03/15/2012  . Uncontrolled diabetes mellitus type 2 without complications ([place]) [id number] 03/15/2012  . Renal cell cancer ([place]) [C64.9] 12/30/2011  . Diverticulitis-history of [K57.92] 12/30/2011  . Abdominal pain [789.0] 12/30/2011    Total Time spent with patient: 30 minutes  Musculoskeletal: Strength & Muscle Tone: within normal limits Gait & Station: normal Patient leans: N/A  Psychiatric Specialty Exam: Review of Systems  Psychiatric/Behavioral: Negative for depression and hallucinations.  All other systems reviewed and are negative.   Blood pressure 138/87, pulse 87, temperature 98.7 F (37.1 C), temperature source Oral, resp. rate 18, height 6' 1.5" (1.867 m), weight 113.399 kg (250 lb), SpO2 99 %.Body mass index is 32.53 kg/(m^2).  General Appearance: Casual  Eye Contact::  Fair  Speech:  Normal Rate409  Volume:  Normal  Mood:  Angry but states he is going to let it go  Affect:  Congruent   Thought Process:  Goal Directed  Orientation:  Full (Time, Place, and Person)  Thought Content:  WDL  Suicidal Thoughts:  No  Homicidal Thoughts:  No  Memory:  Immediate;   Fair Recent;   Fair Remote;   Fair  Judgement:  Fair  Insight:  Fair  Psychomotor Activity:  Normal  Concentration:  Fair  Recall:  [employer] of Knowledge:Fair  Language: Fair  Akathisia:  No  Handed:  Right  AIMS (if indicated):     Assets:  Desire for Improvement  Sleep:  Number of Hours: 6.25  Cognition: WNL  ADL's:  Intact   Mental Status Per Nursing Assessment::   On Admission:     Demographic Factors:  Male  Loss Factors: NA  Historical Factors: Impulsivity  Risk Reduction Factors:   Positive social support  Continued Clinical Symptoms:  Previous Psychiatric Diagnoses and Treatments Medical Diagnoses and Treatments/Surgeries  Cognitive Features That Contribute To Risk:  Thought constriction (tunnel vision)    Suicide Risk:  Minimal: No identifiable suicidal ideation.  Patients presenting with no risk factors but with morbid ruminations; may be classified as minimal risk based on the severity of the depressive symptoms    Plan Of Care/Follow-up recommendations: Patient requested discharge. NP Ludger Nutting and Chrys Racer RN discussed patient with Probation officer and his request to be discharged today .Writer discussed plan with patient , pending  ALF as well as continued care. Pt however states he has enough support outside of here and this facility is making him feel worse and wants to be out of here to feel better and does not want anything to do with it. Pt denies SI/HI/AH/VH - reports he is angry at someone here , but is  going to let it go. Activity:  no restrictions Diet:  carb modified Tests:  as per out patient recommendations Other:  follow up with after care  Caroll Cunnington, MD 03/05/2016, 10:36 AM

## 2016-03-05 NOTE — BHH Group Notes (Signed)
[place] LCSW Group Therapy  03/05/2016 / 10 AM  Type of Therapy:  Group Therapy  Participation Level:  Did Not Attend; refused although encouraged to attend  Summary of Progress/Problems: The main focus of today's process group was for the patient to identify ways in which they have in the past sabotaged their own recovery. Motivational Interviewing was utilized to ask the group members what they get out of their self sabotaging behavior(s), and what reasons they may have for wanting to change. The Stages of Change were explained using a handout, and patients identified where they currently are with regard to stages of change.   Sheilah Pigeon, LCSW

## 2016-03-05 NOTE — Progress Notes (Signed)
Patient refused CBG stating, "I'm being discharged."

## 2016-03-05 NOTE — Progress Notes (Signed)
Discharge note:  Patient discharged per MD order.  Patient oppositional and angry.  Refused to sign his discharge instructions. He was demanding to be discharged today.  Refused to take his prescriptions.  Patient refused to set up any aftercare with his Education officer, museum.  When discharge was ready, patient grabbed his bags off his bed and was acting aggressively.  He refused to answer any questions.  He did deny SI/HI/AVH this morning at medication pass.  Patient refused CBG and did not eat lunch.  Called security for assistance.  Patient received all belongings out of his locker.  He refused to sign for his belongings.  Patient was escorted to lobby with staff and security.  Patient is homeless.

## 2016-03-05 NOTE — Progress Notes (Signed)
D: Patient irritable.  He feels we are "not doing anything for me here."  He is not attending groups, nor going to the cafeteria at times.  He is agitated stating, "just discharge me then."  Patient met with provider and will be discharged this afternoon.  He denies SI/HI/AVH. Patient is homeless; will refer to social work for resources in the community. A: Continue to monitor medication management and MD orders.  Safety checks completed every 15 minutes per protocol.  Offer support and encouragement as needed. R: Patient has minimal interaction with staff and peers.  He is resistant to treatment.  Has poor insight.

## 2016-03-31 ENCOUNTER — Telehealth: Payer: Self-pay | Admitting: Medical

## 2016-03-31 NOTE — Telephone Encounter (Signed)
Pt called requesting that Brandon [name] write new scripts for all his meds because he said he has been without meds for a while and he has been having issues with Medicaid saying that he has another insurance. Pt said he went Social Security office to get Medicaid issue corrected and now need all new scripts in order to get meds now. Called pharmacy since pt appear to have active scripts & pharmacy confirmed that pt does have active scripts and not need for new scripts so they ran med through their system and they said it is still denying by Medicaid as if pt has another insurance that is primary. Pharmacy said it takes 7 -10 days for that issue to correct after pt's go to [employer]   We will need to explain this to the patient. Also pt was begging for help so that he could get his diabetes med, Cymbalta, and seizure med. Do we have any samples that we can offer?

## 2016-04-01 NOTE — Telephone Encounter (Signed)
So I am still a little confused, what do you need me to do for now?  If he has active refills, and if I don't need to send refills, I have no way to speed up his Insurance issue.    If we have samples of any of his medications, he is welcome to this, but let me know what he needs. Some of his medications are generic and we wouldn't sample.

## 2016-04-04 NOTE — Telephone Encounter (Signed)
Called pt to advise that we did not have any samples to give him and that he should check with the pharmacy and social services about the Medicaid issue so he can get his meds since there are active scripts at the pharmacy.

## 2016-04-21 ENCOUNTER — Emergency Department (HOSPITAL_COMMUNITY): Payer: Medicaid Other

## 2016-04-21 ENCOUNTER — Encounter (HOSPITAL_COMMUNITY): Payer: Self-pay | Admitting: Emergency Medicine

## 2016-04-21 ENCOUNTER — Inpatient Hospital Stay (HOSPITAL_COMMUNITY)
Admission: EM | Admit: 2016-04-21 | Discharge: 2016-04-24 | DRG: 638 | Disposition: A | Discharge status: Home / Self Care | Payer: Medicaid Other | Attending: Internal Medicine | Admitting: Internal Medicine

## 2016-04-21 ENCOUNTER — Ambulatory Visit (INDEPENDENT_AMBULATORY_CARE_PROVIDER_SITE_OTHER): Payer: Medicaid Other | Admitting: Medical

## 2016-04-21 ENCOUNTER — Encounter: Payer: Self-pay | Admitting: Medical

## 2016-04-21 VITALS — BP 130/90 | HR 84 | Temp 97.9°F | Wt 258.0 lb

## 2016-04-21 DIAGNOSIS — S91302A Unspecified open wound, left foot, initial encounter: Secondary | ICD-10-CM | POA: Diagnosis not present

## 2016-04-21 DIAGNOSIS — E1165 Type 2 diabetes mellitus with hyperglycemia: Secondary | ICD-10-CM

## 2016-04-21 DIAGNOSIS — E118 Type 2 diabetes mellitus with unspecified complications: Secondary | ICD-10-CM

## 2016-04-21 DIAGNOSIS — R509 Fever, unspecified: Secondary | ICD-10-CM

## 2016-04-21 DIAGNOSIS — E11621 Type 2 diabetes mellitus with foot ulcer: Secondary | ICD-10-CM | POA: Diagnosis present

## 2016-04-21 DIAGNOSIS — F0631 Mood disorder due to known physiological condition with depressive features: Secondary | ICD-10-CM

## 2016-04-21 DIAGNOSIS — L97529 Non-pressure chronic ulcer of other part of left foot with unspecified severity: Secondary | ICD-10-CM | POA: Diagnosis present

## 2016-04-21 DIAGNOSIS — Z8673 Personal history of transient ischemic attack (TIA), and cerebral infarction without residual deficits: Secondary | ICD-10-CM

## 2016-04-21 DIAGNOSIS — E08621 Diabetes mellitus due to underlying condition with foot ulcer: Secondary | ICD-10-CM | POA: Diagnosis not present

## 2016-04-21 DIAGNOSIS — F332 Major depressive disorder, recurrent severe without psychotic features: Secondary | ICD-10-CM | POA: Diagnosis present

## 2016-04-21 DIAGNOSIS — Z794 Long term (current) use of insulin: Secondary | ICD-10-CM

## 2016-04-21 DIAGNOSIS — E1159 Type 2 diabetes mellitus with other circulatory complications: Secondary | ICD-10-CM

## 2016-04-21 DIAGNOSIS — L0390 Cellulitis, unspecified: Secondary | ICD-10-CM | POA: Diagnosis present

## 2016-04-21 DIAGNOSIS — I639 Cerebral infarction, unspecified: Secondary | ICD-10-CM | POA: Diagnosis not present

## 2016-04-21 DIAGNOSIS — E1110 Type 2 diabetes mellitus with ketoacidosis without coma: Secondary | ICD-10-CM

## 2016-04-21 DIAGNOSIS — L02612 Cutaneous abscess of left foot: Secondary | ICD-10-CM | POA: Diagnosis present

## 2016-04-21 DIAGNOSIS — M7989 Other specified soft tissue disorders: Secondary | ICD-10-CM

## 2016-04-21 DIAGNOSIS — R200 Anesthesia of skin: Secondary | ICD-10-CM | POA: Diagnosis not present

## 2016-04-21 DIAGNOSIS — F329 Major depressive disorder, single episode, unspecified: Secondary | ICD-10-CM

## 2016-04-21 DIAGNOSIS — Z79899 Other long term (current) drug therapy: Secondary | ICD-10-CM

## 2016-04-21 DIAGNOSIS — IMO0002 Reserved for concepts with insufficient information to code with codable children: Secondary | ICD-10-CM

## 2016-04-21 DIAGNOSIS — D649 Anemia, unspecified: Secondary | ICD-10-CM | POA: Diagnosis present

## 2016-04-21 DIAGNOSIS — Z833 Family history of diabetes mellitus: Secondary | ICD-10-CM | POA: Diagnosis not present

## 2016-04-21 DIAGNOSIS — E1169 Type 2 diabetes mellitus with other specified complication: Secondary | ICD-10-CM | POA: Diagnosis not present

## 2016-04-21 DIAGNOSIS — F32A Depression, unspecified: Secondary | ICD-10-CM

## 2016-04-21 DIAGNOSIS — Z85528 Personal history of other malignant neoplasm of kidney: Secondary | ICD-10-CM

## 2016-04-21 DIAGNOSIS — L089 Local infection of the skin and subcutaneous tissue, unspecified: Secondary | ICD-10-CM

## 2016-04-21 DIAGNOSIS — E11628 Type 2 diabetes mellitus with other skin complications: Secondary | ICD-10-CM

## 2016-04-21 DIAGNOSIS — I10 Essential (primary) hypertension: Secondary | ICD-10-CM | POA: Diagnosis present

## 2016-04-21 DIAGNOSIS — Z7982 Long term (current) use of aspirin: Secondary | ICD-10-CM | POA: Diagnosis not present

## 2016-04-21 LAB — CBC
HEMATOCRIT: 36.6 % — AB (ref 39.0–52.0)
HEMOGLOBIN: 12.3 g/dL — AB (ref 13.0–17.0)
MCH: 30 pg (ref 26.0–34.0)
MCHC: 33.6 g/dL (ref 30.0–36.0)
MCV: 89.3 fL (ref 78.0–100.0)
Platelets: 198 10*3/uL (ref 150–400)
RBC: 4.1 MIL/uL — AB (ref 4.22–5.81)
RDW: 12.2 % (ref 11.5–15.5)
WBC: 6.3 10*3/uL (ref 4.0–10.5)

## 2016-04-21 LAB — BASIC METABOLIC PANEL
ANION GAP: 7 (ref 5–15)
BUN: 9 mg/dL (ref 6–20)
CALCIUM: 9.2 mg/dL (ref 8.9–10.3)
CO2: 29 mmol/L (ref 22–32)
Chloride: 102 mmol/L (ref 101–111)
Creatinine, Ser: 0.78 mg/dL (ref 0.61–1.24)
GFR calc non Af Amer: >60 mL/min (ref >60)
GLUCOSE: 377 mg/dL — AB (ref 65–99)
POTASSIUM: 4.3 mmol/L (ref 3.5–5.1)
SODIUM: 138 mmol/L (ref 135–145)

## 2016-04-21 LAB — URINALYSIS, ROUTINE W REFLEX MICROSCOPIC
BILIRUBIN URINE: NEGATIVE
Glucose, UA: >1000 mg/dL — AB
Ketones, ur: NEGATIVE mg/dL
LEUKOCYTES UA: NEGATIVE
NITRITE: NEGATIVE
PH: 6.5 (ref 5.0–8.0)
Protein, ur: NEGATIVE mg/dL
SPECIFIC GRAVITY, URINE: 1.031 — AB (ref 1.005–1.030)

## 2016-04-21 LAB — CBG MONITORING, ED
GLUCOSE-CAPILLARY: 339 mg/dL — AB (ref 65–99)
GLUCOSE-CAPILLARY: 391 mg/dL — AB (ref 65–99)

## 2016-04-21 LAB — URINE MICROSCOPIC-ADD ON

## 2016-04-21 MED ORDER — INSULIN ASPART 100 UNIT/ML ~~LOC~~ SOLN
0.0000 [IU] | Freq: Three times a day (TID) | SUBCUTANEOUS | Status: DC
  Administered 2016-04-22: 11 [IU] via SUBCUTANEOUS
  Administered 2016-04-22: 2 [IU] via SUBCUTANEOUS
  Administered 2016-04-22: 5 [IU] via SUBCUTANEOUS
  Administered 2016-04-23: 3 [IU] via SUBCUTANEOUS
  Administered 2016-04-23: 2 [IU] via SUBCUTANEOUS
  Administered 2016-04-23 – 2016-04-24 (×2): 5 [IU] via SUBCUTANEOUS
  Filled 2016-04-21: qty 1

## 2016-04-21 MED ORDER — VANCOMYCIN HCL IN DEXTROSE 1-5 GM/200ML-% IV SOLN
1000.0000 mg | Freq: Once | INTRAVENOUS | Status: AC
  Administered 2016-04-21: 1000 mg via INTRAVENOUS
  Filled 2016-04-21: qty 200

## 2016-04-21 MED ORDER — ONDANSETRON HCL 4 MG/2ML IJ SOLN
4.0000 mg | Freq: Four times a day (QID) | INTRAMUSCULAR | Status: DC | PRN
Start: 2016-04-21 — End: 2016-04-24
  Administered 2016-04-22 – 2016-04-23 (×2): 4 mg via INTRAVENOUS
  Filled 2016-04-21 (×2): qty 2

## 2016-04-21 MED ORDER — ONDANSETRON HCL 4 MG/2ML IJ SOLN
4.0000 mg | Freq: Once | INTRAMUSCULAR | Status: AC
  Administered 2016-04-21: 4 mg via INTRAVENOUS
  Filled 2016-04-21: qty 2

## 2016-04-21 MED ORDER — VANCOMYCIN HCL IN DEXTROSE 1-5 GM/200ML-% IV SOLN
1000.0000 mg | Freq: Two times a day (BID) | INTRAVENOUS | Status: DC
  Administered 2016-04-22 – 2016-04-23 (×3): 1000 mg via INTRAVENOUS
  Filled 2016-04-21 (×4): qty 200

## 2016-04-21 MED ORDER — PIPERACILLIN-TAZOBACTAM 3.375 G IVPB
3.3750 g | Freq: Three times a day (TID) | INTRAVENOUS | Status: DC
  Administered 2016-04-22 – 2016-04-23 (×5): 3.375 g via INTRAVENOUS
  Filled 2016-04-21 (×6): qty 50

## 2016-04-21 MED ORDER — SODIUM CHLORIDE 0.9 % IV SOLN
INTRAVENOUS | Status: DC
  Administered 2016-04-22 (×2): via INTRAVENOUS

## 2016-04-21 MED ORDER — HYDROMORPHONE HCL 1 MG/ML IJ SOLN
1.0000 mg | Freq: Once | INTRAMUSCULAR | Status: AC
  Administered 2016-04-21: 1 mg via INTRAVENOUS
  Filled 2016-04-21: qty 1

## 2016-04-21 MED ORDER — PIPERACILLIN-TAZOBACTAM 3.375 G IVPB 30 MIN
3.3750 g | Freq: Once | INTRAVENOUS | Status: AC
  Administered 2016-04-21: 3.375 g via INTRAVENOUS
  Filled 2016-04-21: qty 50

## 2016-04-21 MED ORDER — SODIUM CHLORIDE 0.9 % IV BOLUS (SEPSIS)
1000.0000 mL | Freq: Once | INTRAVENOUS | Status: AC
  Administered 2016-04-21: 1000 mL via INTRAVENOUS

## 2016-04-21 MED ORDER — KETOROLAC TROMETHAMINE 30 MG/ML IJ SOLN
30.0000 mg | Freq: Four times a day (QID) | INTRAMUSCULAR | Status: DC | PRN
  Administered 2016-04-22 – 2016-04-23 (×2): 30 mg via INTRAVENOUS
  Filled 2016-04-21 (×2): qty 1

## 2016-04-21 MED ORDER — PANTOPRAZOLE SODIUM 40 MG PO TBEC
40.0000 mg | DELAYED_RELEASE_TABLET | Freq: Every day | ORAL | Status: DC
  Administered 2016-04-22 – 2016-04-24 (×4): 40 mg via ORAL
  Filled 2016-04-21 (×4): qty 1

## 2016-04-21 MED ORDER — HYDROMORPHONE HCL 1 MG/ML IJ SOLN
1.0000 mg | INTRAMUSCULAR | Status: DC | PRN
  Administered 2016-04-22: 1 mg via INTRAVENOUS
  Filled 2016-04-21: qty 1

## 2016-04-21 MED ORDER — ENOXAPARIN SODIUM 40 MG/0.4ML ~~LOC~~ SOLN: 40.0000 mg | SUBCUTANEOUS | Status: DC

## 2016-04-21 MED ORDER — VANCOMYCIN HCL IN DEXTROSE 1-5 GM/200ML-% IV SOLN
1000.0000 mg | Freq: Once | INTRAVENOUS | Status: AC
  Administered 2016-04-21: 1000 mg via INTRAVENOUS

## 2016-04-21 NOTE — Progress Notes (Signed)
Subjective: Chief Complaint  Patient presents with  . sore on foot    lt foot up past his ankle. started taking some lyrica he found. said that it throbs and is really sore. lost his meter and strips. has not been able to check recently. has been out of medication because of insurance problem. someone gave him lantus that he is using   Here with girlfriend/spouse.  Mr. Deignan is an uncontrolled insulin dependent diabetic.  He had a recent psychiatric hospitalization due to PTSD and depression issues.   Since last visit there was an insurance issue where he couldn't get insulin or Invokamet.  Thus, he was out of medication for a period of time, weeks/month or more.   In the last few weeks just using Novolog 70/30, 40 units BID.  He never was able to get the Invokamet apparently.     Today he is here for left foot wound and leg and foot swelling.   He notes spot/sore on bottom of left foot x several days, but leg and foot has been sore and swelling the last week or so.  Swelling and soreness in leg is getting more tender in left leg.  Foot hurts too.   Had fever few nights ago, chills.     He notes that he lost his glucometer.  Past Medical History  Diagnosis Date  . Hypertension   . Diverticulitis     s/p micorperforation Sept 2012-managed conservatively by Gen surgery  . Kidney tumor 09/2011    Renal cell CA  . Wears glasses   . ED (erectile dysfunction)   . 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 Acute ischemic stroke ([place]) 11/2013  . Focal seizure ([place]) 11/2013    due to ischemic stroke  . Diabetes mellitus     DKA prior hospitalization  . Depression     BH Adm in [place] Depression   ROS as in subjective   Objective: BP 130/90 mmHg  Pulse 84  Wt 258 lb (117.028 kg)  Gen: wn/wd, nad Left leg and foot below knee warm to touch Tender throughout left foot and leg Foot swollen in general, left lower leg mildly swollen compared to  right Left volar food beneath great toe at MTP with 1.4 cm ulcer, but there is underlying darker coloration suggestive of blood and possible pus, quite tender, can't bear weight fully on the foot Right leg and foot unremarkable except thickened brownish toenails throughout suggestive of onychomycosis    Assessment: Encounter Diagnoses  Name Primary?  [name] Uncontrolled type 2 diabetes mellitus with other circulatory complication, with long-term current use of insulin ([place]) Yes  . Wound, open, foot with complication, left, initial encounter   . Foot ulcer, left, with unspecified severity ([place])   . Leg swelling   . Fever, unspecified   . 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 Numbness   . Depression       Plan: Concern for foot wound pain and swelling, possible osteomyelitis.  Patient also been examined by Dr. Redmond School supervising physician.  Given findings, will send to the ED to rule out osteomyelitis.   At minimum he will likely need IV antibiotics.     Of notes, given his psychiatric history, problems with insurance getting medication, uncontrolled diabetes for years now, will refer to endocrinology.  His diabetes has been hard to control for a variety of reasons.   Gave new script for glucometer.  Gave script for cane at his request.  His spouse will take him to the ED.

## 2016-04-21 NOTE — Progress Notes (Signed)
[facility] consulted for medication assistance and home health services.  Patient listed as having Medicaid insurance.  Patient's prescriptions are three dollars or less under Medicaid.  Per chart review patient has not been able to obtain Invokamet or his insulin due to cost.  Per chart review patient is on Lantus insulin.  Cost of Invokamet is over five hundred dollars without insurance, however this medication may not be on the Medicaid preferred list and may be the reason why patient is unable to obtain this medication.  Patient may be able to have home health RN and SW if needed on discharge.  Patient does not have qualifying diagnosis for Medicaid to approve PT or OT.  CM consult placed.  No further EDCM needs at this time.

## 2016-04-21 NOTE — Progress Notes (Signed)
Pharmacy Antibiotic Follow-up Note  Brandon [name] is a 55 y.o. year-old male admitted on 04/21/2016.  The patient is currently on day 1 of Vancomycin & Zosyn for diabetic foot infection.  Assessment/Plan: Vancomycin 2gm load, then 1gm  IV every 12 hours.  Goal trough 15-20 mcg/mL. Zosyn 3.375g IV q8h (4 hour infusion).  Temp (24hrs), Avg:97.9 F (36.6 C), Min:97.7 F (36.5 C), Max:98.2 F (36.8 C)   Recent Labs Lab 04/21/16 1341  WBC 6.3    Recent Labs Lab 04/21/16 1341  CREATININE 0.78   Estimated Creatinine Clearance: 144.2 mL/min (by C-G formula based on Cr of 0.78).    No Known Allergies  Antimicrobials this admission: 5/18 Zosyn >>  5/18 Vancomycin >>   Levels/dose changes this admission:  Microbiology results: None ordered  Thank you for allowing pharmacy to be a part of this patient's care.  Minda Ditto PharmD Pager [phone] 04/21/2016, 10:07 PM

## 2016-04-21 NOTE — ED Notes (Signed)
Pt sent by PCP for sore on the bottom of his foot. L foot appears swollen. Also reports throbbing pain going up his L calf from foot. Hx of DM.

## 2016-04-21 NOTE — ED Notes (Signed)
PA at bedside.

## 2016-04-21 NOTE — ED Provider Notes (Signed)
CSN: [id number]     Arrival date & time 04/21/16  1205 History   First MD Initiated Contact with Patient 04/21/16 1747     Chief Complaint  Patient presents with  . Foot Ulcer     (Consider location/radiation/quality/duration/timing/severity/associated sxs/prior Treatment) HPI   The patient is a 55 year old male with poorly controlled insulin-dependent diabetes, hypertension, depression, history of renal cell carcinoma in remission, presents to emergency Department with left foot swelling, redness and pain which has severely worsened over the past 3 days. Is currently radiating up his foot and leg with pain extending up to the back of his thigh. Patient is rated as severe, described as throbbing and sharp.  She denies fevers but states he has been having hot and cold chills for several days and has been nauseous as well.  The patient reports that he went to his PCP today with the same complaints and was sent to the ER for evaluation. He denies chest pain, shortness of breath, headache.   No other acute sx.  Past Medical History  Diagnosis Date  . Hypertension   . Diverticulitis     s/p micorperforation Sept 2012-managed conservatively by Gen surgery  . Kidney tumor 09/2011    Renal cell CA  . Wears glasses   . ED (erectile dysfunction)   . 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 Acute ischemic stroke ([place]) 11/2013  . Focal seizure ([place]) 11/2013    due to ischemic stroke  . Diabetes mellitus     DKA prior hospitalization  . Depression     BH Adm in [facility] Depression   Past Surgical History  Procedure Laterality Date  . Kidney surgery      ablation of renal cell CA - 12/28, prior one was October 2012-Dr. Kathlene Cote  . Tee without cardioversion N/A 11/19/2013    Procedure: TRANSESOPHAGEAL ECHOCARDIOGRAM (TEE);  Surgeon: Lelon Perla, MD;  Location: [facility] ENDOSCOPY;  Service: Cardiovascular;  Laterality: N/A;   Family History  Problem  Relation Age of Onset  . Diabetes Father    Social History  Substance Use Topics  . Smoking status: Never Smoker   . Smokeless tobacco: Never Used  . Alcohol Use: No    Review of Systems  All other systems reviewed and are negative.     Allergies  Review of patient's allergies indicates no known allergies.  Home Medications   Prior to Admission medications   Medication Sig Start Date End Date Taking? Authorizing Provider  DULoxetine (CYMBALTA) 60 MG capsule Take 1 capsule (60 mg total) by mouth daily. 03/05/16  Yes Derrill Center, NP  insulin glargine (LANTUS) 100 UNIT/ML injection Inject 40 Units into the skin 2 (two) times daily.   Yes Historical Provider, MD  levETIRAcetam (KEPPRA) 500 MG tablet Take 1 tablet (500 mg total) by mouth 2 (two) times daily. 03/05/16  Yes Derrill Center, NP  pregabalin (LYRICA) 50 MG capsule Take 100 mg by mouth once.   Yes Historical Provider, MD  aspirin 325 MG tablet Take 1 tablet (325 mg total) by mouth daily. Patient not taking: Reported on 04/21/2016 04/23/15   Camelia Eng Tysinger, PA-C  atorvastatin (LIPITOR) 40 MG tablet Take 1 tablet (40 mg total) by mouth daily. Patient not taking: Reported on 02/29/2016 01/05/16   Camelia Eng Tysinger, PA-C  Canagliflozin-Metformin HCl (INVOKAMET) 50-1000 MG TABS Take 1 tablet by mouth 2 (two) times daily. Patient not taking: Reported on  02/18/2016 01/05/16   Camelia Eng Tysinger, PA-C  levETIRAcetam (KEPPRA) 500 MG tablet Take 1 tablet (500 mg total) by mouth 2 (two) times daily. Patient not taking: Reported on 02/29/2016 01/05/16   Camelia Eng Tysinger, PA-C  traZODone (DESYREL) 50 MG tablet Take 1 tablet (50 mg total) by mouth at bedtime as needed for sleep. Patient not taking: Reported on 04/21/2016 03/05/16   Derrill Center, NP   BP 150/92 mmHg  Pulse 91  Temp(Src) 97.7 F (36.5 C) (Oral)  Resp 29  SpO2 98% Physical Exam  Constitutional: He is oriented to person, place, and time. He appears well-developed and  well-nourished. No distress.  Chronically ill-appearing male, appears older than stated age, appears very uncomfortable but nontoxic, NAD  HENT:  Head: Normocephalic and atraumatic.  Nose: Nose normal.  MMM  Eyes: Conjunctivae and EOM are normal. Pupils are equal, round, and reactive to light. Right eye exhibits no discharge. Left eye exhibits no discharge. No scleral icterus.  Neck: Normal range of motion. No JVD present. No tracheal deviation present. No thyromegaly present.  Cardiovascular: Normal rate, regular rhythm and normal heart sounds.  Exam reveals no gallop and no friction rub.   No murmur heard. Left lower extremity edema with erythema, nonpitting 1+ lower extremity pulses  Pulmonary/Chest: Effort normal and breath sounds normal. No respiratory distress. He has no wheezes.  Abdominal: Soft. Bowel sounds are normal. He exhibits no distension and no mass. There is no tenderness. There is no rebound and no guarding.  Musculoskeletal: Normal range of motion. He exhibits edema and tenderness.       Left ankle: He exhibits swelling. He exhibits normal range of motion, no ecchymosis, no deformity and no laceration.       Left foot: There is tenderness, bony tenderness and swelling. There is normal range of motion, no crepitus, no deformity and no laceration. Decreased capillary refill: unable to assess with thickened toenails.       Feet:  Lymphadenopathy:    He has no cervical adenopathy.  Neurological: He is alert and oriented to person, place, and time. He has normal reflexes. No cranial nerve deficit. He exhibits normal muscle tone. Coordination normal.  Skin: Skin is warm and dry. No rash noted. He is not diaphoretic. No erythema. No pallor.  Psychiatric: He has a normal mood and affect. His behavior is normal. Judgment and thought content normal.  Nursing note and vitals reviewed.          ED Course  Procedures (including critical care time)  INCISION AND  DRAINAGE Performed by: Delsa Grana Consent: Verbal consent obtained. Risks and benefits: risks, benefits and alternatives were discussed Time out performed prior to procedure Type: abscess Body area: left plantar foot over 1st MTP area Anesthesia: none Incision was made with a scalpel. Local anesthetic: none Anesthetic total: 0 mL Complexity: complex Blunt dissection to break up loculations Drainage: purulent Drainage amount: copious Packing material:n/a Patient tolerance: Patient tolerated the procedure well with no immediate complications.   Labs Review Labs Reviewed  BASIC METABOLIC PANEL - Abnormal; Notable for the following:    Glucose, Bld 377 (*)    All other components within normal limits  CBC - Abnormal; Notable for the following:    RBC 4.10 (*)    Hemoglobin 12.3 (*)    HCT 36.6 (*)    All other components within normal limits  URINALYSIS, ROUTINE W REFLEX MICROSCOPIC (NOT AT [facility]) - Abnormal; Notable for the following:  Specific Gravity, Urine 1.031 (*)    Glucose, UA >1000 (*)    Hgb urine dipstick TRACE (*)    All other components within normal limits  URINE MICROSCOPIC-ADD ON - Abnormal; Notable for the following:    Squamous Epithelial / LPF 0-5 (*)    Bacteria, UA RARE (*)    All other components within normal limits  CBG MONITORING, ED - Abnormal; Notable for the following:    Glucose-Capillary 339 (*)    All other components within normal limits  CBG MONITORING, ED - Abnormal; Notable for the following:    Glucose-Capillary 391 (*)    All other components within normal limits    Imaging Review Dg Foot Complete Left  04/21/2016  CLINICAL DATA:  55 year old male with left foot pain and swelling EXAM: LEFT FOOT - COMPLETE 3+ VIEW COMPARISON:  None. FINDINGS: There is a focal area of cortical irregularity and step-off at the base of the fifth metatarsal. This may be related to an old healed fracture. An acute fracture is not excluded. Correlation with  clinical exam and evaluation for point tenderness over the lateral aspect of the foot recommended. No other acute fracture identified. The bones are osteopenic there is bone spurring at the calcaneonavicular joint. There are degenerative changes of the head of the first metatarsal with multiple small cyst formation. There is mild soft tissue swelling over the medial aspect of the first MTP joint. No radiopaque foreign object or gas identified. There is diffuse soft tissue swelling of the mid and forefoot. IMPRESSION: Age indeterminate fracture of the base of the fifth metatarsal. Clinical correlation is recommended. Diffuse soft tissue swelling of the foot.  No soft tissue gas. Degenerative changes of the head of the first metatarsal as well as calcaneonavicular joint. Electronically Signed   By: Anner Crete M.D.   On: 04/21/2016 18:52   I have personally reviewed and evaluated these images and lab results as part of my medical decision-making.   EKG Interpretation None      MDM   Pt with poorly controlled diabetes, presents with left foot infection. Infection appears spread to foot with edema and erythema, pt has pain radiating up to his knee.  Patient is afebrile, presents hypertensive. Does complain of nausea, hot and cold chills. Basic labs and imaging obtained.  Labs pertinent for hyperglycemia, anemia, no leukocytosis.  Feel patient is high risk given his poorly controlled diabetes and given the extent of cellulitis. X-rays pertinent for diffuse soft tissue swelling, no visualized gas or osteomyelitis.  Fluctuant area of foot ulcer/abscess opened with aseptic technique.  Large amount of purulent drainage. Patient tolerated.  Case was discussed with attending EDP, Dr. Johnney Killian, who agrees pt will need admission for cellulitis and significant risk for worsening infection.  Patient was given IV antibiotics, thank and Zosyn, in the ER. He was given multiple doses of Dilaudid. He remained  hypertensive, currently 166/110, he does not believe he is currently on antihypertensives.  [facility] hospitalist was consulted for admission, I spoke with Dr. Olevia Bowens who will admit to [facility] for further workup and treatment of diabetic foot infection.     Final diagnoses:  Diabetic foot infection ([place])      Delsa Grana, PA-C 04/21/16 2151  Charlesetta Shanks, MD 04/21/16 2322

## 2016-04-21 NOTE — H&P (Signed)
History and Physical    Brandon [name] [id number] DOB: 05-May-1961 DOA: 04/21/2016  PCP: [name], PA-C   Patient coming from: Home (Referred by PCP to ED).  Chief Complaint: Left foot sore and swollen.  HPI: Brandon [name] is a 55 y.o. male with medical history significant of up retention, diverticulosis, renal cell CA, stroke, diabetes mellitus, depression comes to the emergency department due to above complaint.  Per patient, a few weeks ago he noticed a small lesion on his foot. He is states that last week, he manipulated it causing temporary bleeding. He leading candidate significant symptoms until a few days later, when he started noticing increased erythema, edema, calor and tenderness of the area associated with subjective fever, chills and night sweats. He is states that he has had 2-3 episodes of emesis for the past 3 days with 2-3 episodes of mild loose stools. He also complains of blurred vision, polyuria, polydipsia. He went to see his primary care provider today, who suggested for him to come to the emergency department. He denies chest pain, palpitations, dizziness, diaphoresis, PND orthopnea.    ED Course: The patient received analgesics, antiemetics, IV fluids and IV antibiotics. He underwent I&D of the lesions. He is states that he feels better.  Review of Systems: As per HPI otherwise 10 point review of systems negative.  Past Medical History  Diagnosis Date  . Hypertension   . Diverticulitis     s/p micorperforation Sept 2012-managed conservatively by Gen surgery  . Kidney tumor 09/2011    Renal cell CA  . Wears glasses   . ED (erectile dysfunction)   . 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 Acute ischemic stroke ([place]) 11/2013  . Focal seizure ([place]) 11/2013    due to ischemic stroke  . Diabetes mellitus     DKA prior hospitalization  . Depression     BH Adm in [facility] Depression    Past Surgical  History  Procedure Laterality Date  . Kidney surgery      ablation of renal cell CA - 12/28, prior one was October 2012-Dr. Kathlene Cote  . Tee without cardioversion N/A 11/19/2013    Procedure: TRANSESOPHAGEAL ECHOCARDIOGRAM (TEE);  Surgeon: Lelon Perla, MD;  Location: [facility] ENDOSCOPY;  Service: Cardiovascular;  Laterality: N/A;     reports that he has never smoked. He has never used smokeless tobacco. He reports that he does not drink alcohol or use illicit drugs.  No Known Allergies  Family History  Problem Relation Age of Onset  . Diabetes Father     Prior to Admission medications   Medication Sig Start Date End Date Taking? Authorizing Provider  DULoxetine (CYMBALTA) 60 MG capsule Take 1 capsule (60 mg total) by mouth daily. 03/05/16  Yes Derrill Center, NP  insulin glargine (LANTUS) 100 UNIT/ML injection Inject 40 Units into the skin 2 (two) times daily.   Yes Historical Provider, MD  levETIRAcetam (KEPPRA) 500 MG tablet Take 1 tablet (500 mg total) by mouth 2 (two) times daily. 03/05/16  Yes Derrill Center, NP  pregabalin (LYRICA) 50 MG capsule Take 100 mg by mouth once.   Yes Historical Provider, MD  aspirin 325 MG tablet Take 1 tablet (325 mg total) by mouth daily. Patient not taking: Reported on 04/21/2016 04/23/15   Camelia Eng Tysinger, PA-C  atorvastatin (LIPITOR) 40 MG tablet Take 1 tablet (40 mg total) by mouth daily. Patient not  taking: Reported on 02/29/2016 01/05/16   Camelia Eng Tysinger, PA-C  Canagliflozin-Metformin HCl (INVOKAMET) 50-1000 MG TABS Take 1 tablet by mouth 2 (two) times daily. Patient not taking: Reported on 02/18/2016 01/05/16   Camelia Eng Tysinger, PA-C  levETIRAcetam (KEPPRA) 500 MG tablet Take 1 tablet (500 mg total) by mouth 2 (two) times daily. Patient not taking: Reported on 02/29/2016 01/05/16   Camelia Eng Tysinger, PA-C  traZODone (DESYREL) 50 MG tablet Take 1 tablet (50 mg total) by mouth at bedtime as needed for sleep. Patient not taking: Reported on 04/21/2016 03/05/16    Derrill Center, NP    Physical Exam: Filed Vitals:   04/21/16 1905 04/21/16 1930 04/21/16 2000 04/21/16 2147  BP: 164/120 157/101 166/110 150/92  Pulse: 78 70 85 91  Temp:      TempSrc:      Resp: 16   29  SpO2: 100% 99% 98% 98%      Constitutional: NAD, calm, comfortable Filed Vitals:   04/21/16 1905 04/21/16 1930 04/21/16 2000 04/21/16 2147  BP: 164/120 157/101 166/110 150/92  Pulse: 78 70 85 91  Temp:      TempSrc:      Resp: 16   29  SpO2: 100% 99% 98% 98%   Eyes: PERRL, lids and conjunctivae normal ENMT: Mucous membranes are dry. Posterior pharynx clear of any exudate or lesions. Dentition in poor state of repair.  Neck: normal, supple, no masses, no thyromegaly Respiratory: clear to auscultation bilaterally, no wheezing, no crackles. Normal respiratory effort. No accessory muscle use.  Cardiovascular: Regular rate and rhythm, no murmurs / rubs / gallops. No extremity edema. 2+ pedal pulses. No carotid bruits.  Abdomen: no tenderness, no masses palpated. No hepatosplenomegaly. Bowel sounds positive.  Musculoskeletal:  left foot tender ulcer with abscess in left plantar area with surrounding edema and erythema. no clubbing / cyanosis. No joint deformity upper and lower extremities. Good ROM, no contractures. Normal muscle tone.  Skin:  Neurologic:  Positive dysarthria. CN 2-12 grossly intact. Sensation intact, DTR normal. Strength 5/5 on Right, 4.5/5 on Left.  Psychiatric: Normal judgment and insight. Alert and oriented x  4. Normal mood.        Labs on Admission: I have personally reviewed following labs and imaging studies  CBC:  Recent Labs Lab 04/21/16 1341  WBC 6.3  HGB 12.3*  HCT 36.6*  MCV 89.3  PLT 99991111   Basic Metabolic Panel:  Recent Labs Lab 04/21/16 1341  NA 138  K 4.3  CL 102  CO2 29  GLUCOSE 377*  BUN 9  CREATININE 0.78  CALCIUM 9.2   GFR: Estimated Creatinine Clearance: 144.2 mL/min (by C-G formula based on Cr of  0.78).  CBG:  Recent Labs Lab 04/21/16 1229 04/21/16 1513  GLUCAP 339* 391*   Urine analysis:    Component Value Date/Time   COLORURINE YELLOW 04/21/2016 [place] 04/21/2016 1603   LABSPEC 1.031* 04/21/2016 1603   PHURINE 6.5 04/21/2016 1603   GLUCOSEU >1000* 04/21/2016 1603   HGBUR TRACE* 04/21/2016 1603   BILIRUBINUR NEGATIVE 04/21/2016 1603   BILIRUBINUR n 10/19/2015 1423   [place] 04/21/2016 1603   PROTEINUR NEGATIVE 04/21/2016 1603   PROTEINUR + 10/19/2015 1423   UROBILINOGEN 0.2 10/19/2015 1423   UROBILINOGEN 1.0 12/13/2014 1029   NITRITE NEGATIVE 04/21/2016 1603   NITRITE n 10/19/2015 1423   LEUKOCYTESUR NEGATIVE 04/21/2016 1603     Radiological Exams on Admission: Dg Foot Complete Left  04/21/2016  CLINICAL DATA:  55 year old male with left foot pain and swelling EXAM: LEFT FOOT - COMPLETE 3+ VIEW COMPARISON:  None. FINDINGS: There is a focal area of cortical irregularity and step-off at the base of the fifth metatarsal. This may be related to an old healed fracture. An acute fracture is not excluded. Correlation with clinical exam and evaluation for point tenderness over the lateral aspect of the foot recommended. No other acute fracture identified. The bones are osteopenic there is bone spurring at the calcaneonavicular joint. There are degenerative changes of the head of the first metatarsal with multiple small cyst formation. There is mild soft tissue swelling over the medial aspect of the first MTP joint. No radiopaque foreign object or gas identified. There is diffuse soft tissue swelling of the mid and forefoot. IMPRESSION: Age indeterminate fracture of the base of the fifth metatarsal. Clinical correlation is recommended. Diffuse soft tissue swelling of the foot.  No soft tissue gas. Degenerative changes of the head of the first metatarsal as well as calcaneonavicular joint. Electronically Signed   By: Anner Crete M.D.   On:  04/21/2016 18:52     Assessment/Plan Principal Problem:   Diabetic ulcer of left foot associated with secondary diabetes mellitus ([place]) Admit to MedSurg/inpatient. Continue IV fluids. Continue analgesics as needed. Continue antiemetics as needed. Continue IV antibiotics. Follow-up wound culture and sensitivity. Vascular studies of left lower extremity arterial circulation in a.m. Check MRI of the foot to rule out osteomyelitis.  Active Problems:   Type 2 diabetes mellitus with complication, with long-term current use of insulin (HCC) Carbohydrate modified diet. Continue Lantus 40 units SQ twice a day. CBG monitoring with regular insulin sliding scale while in the hospital.     Essential hypertension, benign Per patient, his antihypertensives were held by his doctor. He has been having consistently elevated numbers, but this may be due to pain. Start low-dose ACE inhibitor. Monitor blood pressure periodically.    History of stroke Continue aspirin 325 mg by mouth daily. Continue risk factors modifications.      Severe episode of recurrent major depressive disorder, without psychotic features (HCC) Continue Cymbalta 60 mg by mouth daily. Continue trazodone at bedtime as needed. Follow-up with psychiatry as needed.   DVT prophylaxis: Lovenox SQ. Code Status: Full code. Family Communication: His spouse/SO [name] was present in the room Disposition Plan: Admit for workup and IV antibiotic therapy for a few days. Consults called:  Admission status: MedSurg/inpatient   Reubin Milan MD [facility] Pager [phone].  If 7PM-7AM, please contact night-coverage [email or web address] Password [email or web address]  04/21/2016, 10:02 PM

## 2016-04-22 ENCOUNTER — Inpatient Hospital Stay (HOSPITAL_COMMUNITY): Payer: Medicaid Other

## 2016-04-22 DIAGNOSIS — L089 Local infection of the skin and subcutaneous tissue, unspecified: Secondary | ICD-10-CM

## 2016-04-22 DIAGNOSIS — L97529 Non-pressure chronic ulcer of other part of left foot with unspecified severity: Secondary | ICD-10-CM

## 2016-04-22 DIAGNOSIS — E118 Type 2 diabetes mellitus with unspecified complications: Secondary | ICD-10-CM

## 2016-04-22 DIAGNOSIS — E08621 Diabetes mellitus due to underlying condition with foot ulcer: Secondary | ICD-10-CM

## 2016-04-22 DIAGNOSIS — F332 Major depressive disorder, recurrent severe without psychotic features: Secondary | ICD-10-CM

## 2016-04-22 DIAGNOSIS — E1169 Type 2 diabetes mellitus with other specified complication: Secondary | ICD-10-CM

## 2016-04-22 DIAGNOSIS — Z794 Long term (current) use of insulin: Secondary | ICD-10-CM

## 2016-04-22 DIAGNOSIS — I10 Essential (primary) hypertension: Secondary | ICD-10-CM

## 2016-04-22 LAB — GLUCOSE, CAPILLARY
GLUCOSE-CAPILLARY: 186 mg/dL — AB (ref 65–99)
Glucose-Capillary: 312 mg/dL — ABNORMAL HIGH (ref 65–99)
Glucose-Capillary: 268 mg/dL — ABNORMAL HIGH (ref 65–99)
Glucose-Capillary: 135 mg/dL — ABNORMAL HIGH (ref 65–99)

## 2016-04-22 LAB — CBC WITH DIFFERENTIAL/PLATELET
BASOS ABS: 0 10*3/uL (ref 0.0–0.1)
BASOS PCT: 0 %
Eosinophils Absolute: 0.1 10*3/uL (ref 0.0–0.7)
Eosinophils Relative: 2 %
HEMATOCRIT: 35.6 % — AB (ref 39.0–52.0)
HEMOGLOBIN: 12.6 g/dL — AB (ref 13.0–17.0)
LYMPHS PCT: 24 %
Lymphs Abs: 1.5 10*3/uL (ref 0.7–4.0)
MCH: 30.2 pg (ref 26.0–34.0)
MCHC: 35.4 g/dL (ref 30.0–36.0)
MCV: 85.4 fL (ref 78.0–100.0)
MONOS PCT: 6 %
Monocytes Absolute: 0.4 10*3/uL (ref 0.1–1.0)
NEUTROS ABS: 4.3 10*3/uL (ref 1.7–7.7)
NEUTROS PCT: 68 %
Platelets: 189 10*3/uL (ref 150–400)
RBC: 4.17 MIL/uL — ABNORMAL LOW (ref 4.22–5.81)
RDW: 11.9 % (ref 11.5–15.5)
WBC: 6.3 10*3/uL (ref 4.0–10.5)

## 2016-04-22 LAB — COMPREHENSIVE METABOLIC PANEL
ALBUMIN: 3.8 g/dL (ref 3.5–5.0)
ALK PHOS: 99 U/L (ref 38–126)
ALT: 12 U/L — ABNORMAL LOW (ref 17–63)
AST: 15 U/L (ref 15–41)
Anion gap: 8 (ref 5–15)
BILIRUBIN TOTAL: 1.3 mg/dL — AB (ref 0.3–1.2)
BUN: 7 mg/dL (ref 6–20)
CALCIUM: 9.1 mg/dL (ref 8.9–10.3)
CO2: 26 mmol/L (ref 22–32)
CREATININE: 0.78 mg/dL (ref 0.61–1.24)
Chloride: 100 mmol/L — ABNORMAL LOW (ref 101–111)
GFR calc Af Amer: >60 mL/min (ref >60)
GLUCOSE: 310 mg/dL — AB (ref 65–99)
Potassium: 3.5 mmol/L (ref 3.5–5.1)
Sodium: 134 mmol/L — ABNORMAL LOW (ref 135–145)
TOTAL PROTEIN: 7.6 g/dL (ref 6.5–8.1)

## 2016-04-22 LAB — MAGNESIUM: Magnesium: 2 mg/dL (ref 1.7–2.4)

## 2016-04-22 LAB — SEDIMENTATION RATE: SED RATE: 45 mm/h — AB (ref 0–16)

## 2016-04-22 LAB — C-REACTIVE PROTEIN: CRP: 6.7 mg/dL — AB (ref <1.0)

## 2016-04-22 MED ORDER — LEVETIRACETAM 500 MG PO TABS
500.0000 mg | ORAL_TABLET | Freq: Two times a day (BID) | ORAL | Status: DC
  Administered 2016-04-22 – 2016-04-24 (×4): 500 mg via ORAL
  Filled 2016-04-22 (×7): qty 1

## 2016-04-22 MED ORDER — POTASSIUM CHLORIDE CRYS ER 20 MEQ PO TBCR
40.0000 meq | EXTENDED_RELEASE_TABLET | Freq: Once | ORAL | Status: AC
  Administered 2016-04-22: 40 meq via ORAL
  Filled 2016-04-22: qty 2

## 2016-04-22 MED ORDER — ATORVASTATIN CALCIUM 20 MG PO TABS
40.0000 mg | ORAL_TABLET | Freq: Every day | ORAL | Status: DC
  Administered 2016-04-22 – 2016-04-23 (×2): 40 mg via ORAL
  Filled 2016-04-22 (×2): qty 2

## 2016-04-22 MED ORDER — MUPIROCIN 2 % EX OINT
TOPICAL_OINTMENT | CUTANEOUS | Status: AC
  Administered 2016-04-22: 20:00:00
  Filled 2016-04-22: qty 22

## 2016-04-22 MED ORDER — MUPIROCIN CALCIUM 2 % EX CREA
TOPICAL_CREAM | Freq: Every day | CUTANEOUS | Status: DC
  Administered 2016-04-23 – 2016-04-24 (×2): via TOPICAL
  Filled 2016-04-22: qty 15

## 2016-04-22 MED ORDER — PREGABALIN 100 MG PO CAPS
100.0000 mg | ORAL_CAPSULE | Freq: Every day | ORAL | Status: DC
Start: 2016-04-22 — End: 2016-04-24
  Administered 2016-04-22 – 2016-04-24 (×3): 100 mg via ORAL
  Filled 2016-04-22 (×3): qty 1

## 2016-04-22 MED ORDER — DULOXETINE HCL 30 MG PO CPEP
60.0000 mg | ORAL_CAPSULE | Freq: Every day | ORAL | Status: DC
  Administered 2016-04-22 – 2016-04-24 (×3): 60 mg via ORAL
  Filled 2016-04-22 (×3): qty 2

## 2016-04-22 MED ORDER — GLUCERNA SHAKE PO LIQD
237.0000 mL | Freq: Three times a day (TID) | ORAL | Status: DC
  Administered 2016-04-22 – 2016-04-24 (×5): 237 mL via ORAL
  Filled 2016-04-22 (×9): qty 237

## 2016-04-22 MED ORDER — ENSURE ENLIVE PO LIQD
237.0000 mL | Freq: Two times a day (BID) | ORAL | Status: DC
  Administered 2016-04-22: 237 mL via ORAL

## 2016-04-22 MED ORDER — HYDRALAZINE HCL 20 MG/ML IJ SOLN
10.0000 mg | Freq: Four times a day (QID) | INTRAMUSCULAR | Status: DC | PRN
  Administered 2016-04-22: 10 mg via INTRAVENOUS
  Filled 2016-04-22: qty 1

## 2016-04-22 MED ORDER — PRO-STAT SUGAR FREE PO LIQD
30.0000 mL | Freq: Two times a day (BID) | ORAL | Status: DC
  Administered 2016-04-23 – 2016-04-24 (×3): 30 mL via ORAL
  Filled 2016-04-22 (×6): qty 30

## 2016-04-22 MED ORDER — INSULIN GLARGINE 100 UNIT/ML ~~LOC~~ SOLN
40.0000 [IU] | Freq: Every day | SUBCUTANEOUS | Status: DC
  Administered 2016-04-22: 40 [IU] via SUBCUTANEOUS
  Filled 2016-04-22 (×2): qty 0.4

## 2016-04-22 MED ORDER — ENOXAPARIN SODIUM 60 MG/0.6ML ~~LOC~~ SOLN
60.0000 mg | Freq: Every day | SUBCUTANEOUS | Status: DC
  Administered 2016-04-22 – 2016-04-24 (×3): 60 mg via SUBCUTANEOUS
  Filled 2016-04-22 (×4): qty 0.6

## 2016-04-22 MED ORDER — GADOBENATE DIMEGLUMINE 529 MG/ML IV SOLN
20.0000 mL | Freq: Once | INTRAVENOUS | Status: AC | PRN
  Administered 2016-04-22: 20 mL via INTRAVENOUS

## 2016-04-22 MED ORDER — ASPIRIN 325 MG PO TABS
325.0000 mg | ORAL_TABLET | Freq: Every day | ORAL | Status: DC
  Administered 2016-04-22 – 2016-04-24 (×3): 325 mg via ORAL
  Filled 2016-04-22 (×3): qty 1

## 2016-04-22 NOTE — Progress Notes (Signed)
PROGRESS NOTE    Brandon [name]  [id number] DOB: [birth year] DOA: 04/21/2016 PCP: [name], PA-C    Assessment & Plan:   Principal Problem:   Diabetic ulcer of left foot associated with secondary diabetes mellitus ([place]) Active Problems:   Essential hypertension, benign   History of stroke   Type 2 diabetes mellitus with complication, with long-term current use of insulin (HCC)   Severe episode of recurrent major depressive disorder, without psychotic features ([place])  #1 left foot diabetic ulcer with secondary diabetes mellitus Plain films of the left foot consistent with a soft tissue swelling. Sedimentation rate elevated. Patient with some clinical improvement. MRI of the left foot pending. If MRI is positive for osteomyelitis will need to consult with orthopedics. Continue empiric IV antibiotics. Pain management. Vascular studies of lower extremity arterial circulation pending. Follow.  #2 type 2 diabetes Hemoglobin A1c pending. Patient stated had run out of his 70/30 a few weeks ago. CBG was 312 this morning. Resume home regimen of Lantus 40 units daily. Sliding scale insulin. Diabetic coordinator.  #3 hypertension Start Norvasc.  #4 history of CVA Stable. Continue aspirin for secondary stroke prevention. Risk factor modification.  #5 history of major depressive disorder Continue Cymbalta and trazodone. Outpatient follow-up with psychiatry.   DVT prophylaxis: Lovenox Code Status: Full Family Communication: Updated patient. No family at bedside. Disposition Plan: Home when medically stable hopefully in 1-2 days.   Consultants:   None  Procedures:   Plain films of the left foot 04/21/2016  MRI of the left foot pending  Antimicrobials:   IV vancomycin 04/21/2016  IV Zosyn 04/21/2016   Subjective: Patient states his left foot ulcer is less painful today and feels better. Patient stated had been out of his insulin for several weeks and somewhat  he purchased him some Lantus which he just started.  Objective: Filed Vitals:   04/21/16 2147 04/21/16 2230 04/21/16 2355 04/22/16 0539  BP: 150/92 142/108 175/101 157/96  Pulse: 91 78 79 82  Temp:   97.7 F (36.5 C) 97.9 F (36.6 C)  TempSrc:   Oral Oral  Resp: 29  18 18   Height:   6\' 3"  (1.905 m)   SpO2: 98% 100% 100% 100%    Intake/Output Summary (Last 24 hours) at 04/22/16 1159 Last data filed at 04/22/16 0816  Gross per 24 hour  Intake    240 ml  Output   2000 ml  Net  -1760 ml   Filed Weights    Examination:  General exam: Appears calm and comfortable  Respiratory system: Clear to auscultation. Respiratory effort normal. Cardiovascular system: S1 & S2 heard, RRR. No JVD, murmurs, rubs, gallops or clicks. No pedal edema. Gastrointestinal system: Abdomen is nondistended, soft and nontender. No organomegaly or masses felt. Normal bowel sounds heard. Central nervous system: Alert and oriented. No focal neurological deficits. Extremities: Left foot planter region with a ulcer with less erythema and less edema. Less tender to palpation. Skin: No rashes, lesions or ulcers Psychiatry: Judgement and insight appear normal. Mood & affect appropriate.     Data Reviewed: I have personally reviewed following labs and imaging studies  CBC:  Recent Labs Lab 04/21/16 1341 04/22/16 0355  WBC 6.3 6.3  NEUTROABS  --  4.3  HGB 12.3* 12.6*  HCT 36.6* 35.6*  MCV 89.3 85.4  PLT 198 99991111   Basic Metabolic Panel:  Recent Labs Lab 04/21/16 1341 04/22/16 0355  NA 138 134*  K 4.3 3.5  CL 102  100*  CO2 29 26  GLUCOSE 377* 310*  BUN 9 7  CREATININE 0.78 0.78  CALCIUM 9.2 9.1  MG  --  2.0   GFR: Estimated Creatinine Clearance: 147.3 mL/min (by C-G formula based on Cr of 0.78). Liver Function Tests:  Recent Labs Lab 04/22/16 0355  AST 15  ALT 12*  ALKPHOS 99  BILITOT 1.3*  PROT 7.6  ALBUMIN 3.8   No results for input(s): LIPASE, AMYLASE in the last 168  hours. No results for input(s): AMMONIA in the last 168 hours. Coagulation Profile: No results for input(s): INR, PROTIME in the last 168 hours. Cardiac Enzymes: No results for input(s): CKTOTAL, CKMB, CKMBINDEX, TROPONINI in the last 168 hours. BNP (last 3 results) No results for input(s): PROBNP in the last 8760 hours. HbA1C: No results for input(s): HGBA1C in the last 72 hours. CBG:  Recent Labs Lab 04/21/16 1229 04/21/16 1513 04/22/16 0815  GLUCAP 339* 391* 312*   Lipid Profile: No results for input(s): CHOL, HDL, LDLCALC, TRIG, CHOLHDL, LDLDIRECT in the last 72 hours. Thyroid Function Tests: No results for input(s): TSH, T4TOTAL, FREET4, T3FREE, THYROIDAB in the last 72 hours. Anemia Panel: No results for input(s): VITAMINB12, FOLATE, FERRITIN, TIBC, IRON, RETICCTPCT in the last 72 hours. Sepsis Labs: No results for input(s): PROCALCITON, LATICACIDVEN in the last 168 hours.  No results found for this or any previous visit (from the past 240 hour(s)).       Radiology Studies: Dg Foot Complete Left  04/21/2016  CLINICAL DATA:  55 year old male with left foot pain and swelling EXAM: LEFT FOOT - COMPLETE 3+ VIEW COMPARISON:  None. FINDINGS: There is a focal area of cortical irregularity and step-off at the base of the fifth metatarsal. This may be related to an old healed fracture. An acute fracture is not excluded. Correlation with clinical exam and evaluation for point tenderness over the lateral aspect of the foot recommended. No other acute fracture identified. The bones are osteopenic there is bone spurring at the calcaneonavicular joint. There are degenerative changes of the head of the first metatarsal with multiple small cyst formation. There is mild soft tissue swelling over the medial aspect of the first MTP joint. No radiopaque foreign object or gas identified. There is diffuse soft tissue swelling of the mid and forefoot. IMPRESSION: Age indeterminate fracture of the  base of the fifth metatarsal. Clinical correlation is recommended. Diffuse soft tissue swelling of the foot.  No soft tissue gas. Degenerative changes of the head of the first metatarsal as well as calcaneonavicular joint. Electronically Signed   By: Anner Crete M.D.   On: 04/21/2016 18:52        Scheduled Meds: . aspirin  325 mg Oral Daily  . atorvastatin  40 mg Oral q1800  . DULoxetine  60 mg Oral Daily  . enoxaparin (LOVENOX) injection  60 mg Subcutaneous Daily  . feeding supplement (ENSURE ENLIVE)  237 mL Oral BID BM  . insulin aspart  0-15 Units Subcutaneous TID WC  . insulin glargine  40 Units Subcutaneous Daily  . levETIRAcetam  500 mg Oral BID  . pantoprazole  40 mg Oral Daily  . piperacillin-tazobactam (ZOSYN)  IV  3.375 g Intravenous Q8H  . pregabalin  100 mg Oral Daily  . vancomycin  1,000 mg Intravenous Q12H   Continuous Infusions: . sodium chloride 50 mL/hr at 04/22/16 0112     LOS: 1 day    Time spent: 29 minutes    Ersel Wadleigh, MD [facility] Pager [phone]  If 7PM-7AM, please contact night-coverage [email or web address] Password [email or web address] 04/22/2016, 11:59 AM

## 2016-04-22 NOTE — Consult Note (Signed)
WOC wound consult note Reason for Consult:Neuropathic ulcer to left plantar foot, near first metatarsal.  Lanced in ED and is much improved.  Wound type:Infectious, neuropathic ulcer Pressure Ulcer POA: Yes Measurement:2 cm x 2 cm indurated lesion, lanced in the ED with calloused periwound, extending 1 cm .  Wound [id number], not visible.  Drainage (amount, consistency, odor) No purulence noted today. Pink tinged liquid on dressing. No odor.  Periwound:Calloused Dressing procedure/placement/frequency:Cleanse left plantar foot wound with NS and pat gently dry.  Apply Bactroban to wound.  Cover with dry dressing daily.   Will not follow at this time.  Please re-consult if needed.  Domenic Moras RN BSN [place] Pager [phone]

## 2016-04-22 NOTE — Progress Notes (Signed)
Rx Brief Lovenox note  Wt=117 kg. CrCl=144 ml/min and BMI=33  Rx adjusted Lovenox to 60mg  daily in pt with BMI>30  Thanks Dorrene German 04/22/2016 12:06 AM

## 2016-04-22 NOTE — Progress Notes (Signed)
VASCULAR LAB PRELIMINARY  ARTERIAL  ABI completed:    RIGHT    LEFT    PRESSURE WAVEFORM  PRESSURE WAVEFORM  BRACHIAL 164 Triphasic  BRACHIAL 161 Triphasic   DP 203 Triphasic  DP 200 Triphasic   AT   AT    PT 191 Triphasic  PT 185 Triphasic   PER   PER    GREAT TOE 186 wnl GREAT TOE 169 wnl    RIGHT LEFT  ABI 1.24 1.22  TBI 1.13 1.03     Amen Staszak, RVT 04/22/2016, 2:40 PM

## 2016-04-22 NOTE — Progress Notes (Signed)
Inpatient Diabetes Program Recommendations  AACE/ADA: New Consensus Statement on Inpatient Glycemic Control (2015)  Target Ranges:  Prepandial:   less than 140 mg/dL      Peak postprandial:   less than 180 mg/dL (1-2 hours)      Critically ill patients:  140 - 180 mg/dL   Review of Glycemic Control  Diabetes history: DM2 Outpatient Diabetes medications: No insulin x 3 weeks - previously on 70/30 40 units bid Current orders for Inpatient glycemic control: Lantus 40 units QD, Novolog moderate tidwc  Results for MANDELL, PANGBORN (MRN [id number]) as of 04/22/2016 09:59  Ref. Range 04/22/2016 03:55  Sodium Latest Ref Range: 135-145 mmol/L 134 (L)  Potassium Latest Ref Range: 3.5-5.1 mmol/L 3.5  Chloride Latest Ref Range: 101-111 mmol/L 100 (L)  CO2 Latest Ref Range: 22-32 mmol/L 26  BUN Latest Ref Range: 6-20 mg/dL 7  Creatinine Latest Ref Range: 0.61-1.24 mg/dL 0.78  Calcium Latest Ref Range: 8.9-10.3 mg/dL 9.1  EGFR (Non-African Amer.) Latest Ref Range: >60 mL/min >60  EGFR (African American) Latest Ref Range: >60 mL/min >60  Glucose Latest Ref Range: 65-99 mg/dL 310 (H)  Anion gap Latest Ref Range: 5-15  8  Uncontrolled blood sugars - needs tighter control for healing.  Inpatient Diabetes Program Recommendations:    Increase Lantus to 45 units if FBS on 5/20 is > 180 mg/dL Add HS correction Add Novolog 4 units tidwc for meal coverage insulin   Spoke with pt about his diabetes control. States he was previously on 70/30 40 units bid. Insurance (Medicaid) was dropped and he was unable to get insulin. Friend gave him some Lantus in the past week. Has prescription for meter and supplies. Prefers to be on 70/30 insulin rather than Lantus + 3 shots/day of rapid-acting insulin. Case manager to talk with pt. Recommend transitioning to 70/30 prior to discharge.  Will also need prescription for Novolin R for correction.  Will follow. Thank you. Lorenda Peck, RD, LDN, CDE Inpatient  Diabetes Coordinator [phone]

## 2016-04-22 NOTE — Progress Notes (Signed)
Initial Nutrition Assessment  DOCUMENTATION CODES:   Obesity unspecified  INTERVENTION:  -Glucerna Shake po TID, each supplement provides 220 kcal and 10 grams of protein -Pro-Stat 1mL BID, each supplement provides 100 calories and 15g protein  NUTRITION DIAGNOSIS:   Increased nutrient needs related to wound healing as evidenced by estimated needs.  GOAL:   Patient will meet greater than or equal to 90% of their needs  MONITOR:   PO intake, I & O's, Labs, Supplement acceptance, Weight trends  REASON FOR ASSESSMENT:   Consult Wound healing  ASSESSMENT:   The patient is a 55 year old male with poorly controlled insulin-dependent diabetes, hypertension, depression, history of renal cell carcinoma in remission, presents to emergency Department with left foot swelling, redness and pain which has severely worsened over the past 3 days. Is currently radiating up his foot and leg with pain extending up to the back of his thigh  Brandon [name] presents with a diabetic foot wound r/t poorly controlled IDDM.   Most recent A1C -> 10.3 -> 10/2015 Of course controlling diabetes would be best option to heal/prevent wounds. Pt does offer some issues with access to medication. Patient has also been previously admitted to [facility] for psychiatric concerns. Demonstrated a 30#/11% severe wt loss over 2 months at the time.  Weight is stable now over the past two months. No concerns for appetite or PO intake.  Pt liked Ensure in the past -> will provide Glucerna r/t CBGs [id number]). Pro-Stat BID will also provide adequate protein to help maintain a net positive nitrogen balance and encourage wound healing.  Labs and medications reviewed.  Nutrition-Focused physical exam completed. Findings are no fat depletion, no muscle depletion, and no edema.   Diet Order:  Diet heart healthy/carb modified Room service appropriate?: Yes; Fluid consistency:: Thin  Skin:  Wound (see comment) (Diabetic foot ulcer  to L Foot)  Last BM:  04/21/2016  Height:   Ht Readings from Last 1 Encounters:  04/21/16 6\' 3"  (1.905 m)    Weight:   Wt Readings from Last 1 Encounters:  04/21/16 258 lb (117.028 kg)    Ideal Body Weight:  89.09 kg  BMI:  There is no weight on file to calculate BMI.  Estimated Nutritional Needs:   Kcal:  2000-2400 calories  Protein:  95-105 grams  Fluid:  >/= 2L  EDUCATION NEEDS:   No education needs identified at this time  Brandon [name]. Brandon Farrugia, MS, RD LDN Inpatient Clinical Dietitian Pager [phone]

## 2016-04-22 NOTE — Progress Notes (Signed)
CSW consulted to assist with home meds. CSW is unable to assist with this request. Please review RNCM notes from 04/21/16. CSW signing off.  Werner Lean LCSW [phone]

## 2016-04-23 DIAGNOSIS — Z8673 Personal history of transient ischemic attack (TIA), and cerebral infarction without residual deficits: Secondary | ICD-10-CM

## 2016-04-23 LAB — COMPREHENSIVE METABOLIC PANEL
ALBUMIN: 4 g/dL (ref 3.5–5.0)
ALT: 14 U/L — AB (ref 17–63)
ANION GAP: 9 (ref 5–15)
AST: 20 U/L (ref 15–41)
Alkaline Phosphatase: 94 U/L (ref 38–126)
BUN: 11 mg/dL (ref 6–20)
CHLORIDE: 104 mmol/L (ref 101–111)
CO2: 26 mmol/L (ref 22–32)
CREATININE: 1.14 mg/dL (ref 0.61–1.24)
Calcium: 9.5 mg/dL (ref 8.9–10.3)
GFR calc non Af Amer: >60 mL/min (ref >60)
Glucose, Bld: 160 mg/dL — ABNORMAL HIGH (ref 65–99)
Potassium: 4.7 mmol/L (ref 3.5–5.1)
SODIUM: 139 mmol/L (ref 135–145)
Total Bilirubin: 1.5 mg/dL — ABNORMAL HIGH (ref 0.3–1.2)
Total Protein: 8 g/dL (ref 6.5–8.1)

## 2016-04-23 LAB — GLUCOSE, CAPILLARY
GLUCOSE-CAPILLARY: 144 mg/dL — AB (ref 65–99)
GLUCOSE-CAPILLARY: 236 mg/dL — AB (ref 65–99)
Glucose-Capillary: 207 mg/dL — ABNORMAL HIGH (ref 65–99)
Glucose-Capillary: 200 mg/dL — ABNORMAL HIGH (ref 65–99)

## 2016-04-23 LAB — CBC WITH DIFFERENTIAL/PLATELET
BASOS ABS: 0 10*3/uL (ref 0.0–0.1)
Basophils Relative: 0 %
EOS PCT: 2 %
Eosinophils Absolute: 0.1 10*3/uL (ref 0.0–0.7)
HCT: 40.2 % (ref 39.0–52.0)
Hemoglobin: 13.6 g/dL (ref 13.0–17.0)
LYMPHS ABS: 1.7 10*3/uL (ref 0.7–4.0)
LYMPHS PCT: 34 %
MCH: 30.1 pg (ref 26.0–34.0)
MCHC: 33.8 g/dL (ref 30.0–36.0)
MCV: 88.9 fL (ref 78.0–100.0)
MONO ABS: 0.4 10*3/uL (ref 0.1–1.0)
MONOS PCT: 8 %
NEUTROS ABS: 2.9 10*3/uL (ref 1.7–7.7)
Neutrophils Relative %: 56 %
PLATELETS: 218 10*3/uL (ref 150–400)
RBC: 4.52 MIL/uL (ref 4.22–5.81)
RDW: 12.3 % (ref 11.5–15.5)
WBC: 5.1 10*3/uL (ref 4.0–10.5)

## 2016-04-23 LAB — HEMOGLOBIN A1C
Hgb A1c MFr Bld: 15.6 % — ABNORMAL HIGH (ref 4.8–5.6)
MEAN PLASMA GLUCOSE: 401 mg/dL

## 2016-04-23 MED ORDER — INSULIN GLARGINE 100 UNIT/ML ~~LOC~~ SOLN
45.0000 [IU] | Freq: Every day | SUBCUTANEOUS | Status: DC
  Administered 2016-04-23 – 2016-04-24 (×2): 45 [IU] via SUBCUTANEOUS
  Filled 2016-04-23 (×2): qty 0.45

## 2016-04-23 MED ORDER — INSULIN ASPART 100 UNIT/ML ~~LOC~~ SOLN
4.0000 [IU] | Freq: Three times a day (TID) | SUBCUTANEOUS | Status: DC
Start: 2016-04-23 — End: 2016-04-24
  Administered 2016-04-23 – 2016-04-24 (×2): 4 [IU] via SUBCUTANEOUS

## 2016-04-23 MED ORDER — DOXYCYCLINE HYCLATE 100 MG PO TABS
100.0000 mg | ORAL_TABLET | Freq: Two times a day (BID) | ORAL | Status: DC
Start: 2016-04-23 — End: 2016-04-24
  Administered 2016-04-23 – 2016-04-24 (×2): 100 mg via ORAL
  Filled 2016-04-23 (×3): qty 1

## 2016-04-23 MED ORDER — AMOXICILLIN-POT CLAVULANATE 875-125 MG PO TABS
1.0000 | ORAL_TABLET | Freq: Two times a day (BID) | ORAL | Status: DC
  Administered 2016-04-23 – 2016-04-24 (×2): 1 via ORAL
  Filled 2016-04-23 (×3): qty 1

## 2016-04-23 MED ORDER — OXYCODONE-ACETAMINOPHEN 5-325 MG PO TABS
1.0000 | ORAL_TABLET | ORAL | Status: DC | PRN
  Administered 2016-04-23: 1 via ORAL
  Administered 2016-04-23 – 2016-04-24 (×3): 2 via ORAL
  Filled 2016-04-23 (×3): qty 2
  Filled 2016-04-23: qty 1

## 2016-04-23 NOTE — Progress Notes (Addendum)
Pharmacy Antibiotic Follow-up Note  SOK DOLTON is a 55 y.o. year-old male admitted on 04/21/2016.  The patient is currently on day 1 of Vancomycin & Zosyn for diabetic foot infection. Purulent drainage in ED noted. No osteomyelitis evident on MRI.  Also, no abscess/fluid collection seen.   Today, 04/23/2016 Day #2 antibiotics  MRI neg for osteomyelitis, abscess  Renal: bump in SCr  No culture data  No fevers  No leukocytosis  Assessment/Plan:  Vancomycin 1gm IV q12h  Check vancomycin trough tonight if to continue  ? Need to continue   Zosyn 3.375gm IV q8h  No risk factors for pseudomonas  Temp (24hrs), Avg:97.9 F (36.6 C), Min:97.6 F (36.4 C), Max:98.2 F (36.8 C)   Recent Labs Lab 04/21/16 1341 04/22/16 0355 04/23/16 0442  WBC 6.3 6.3 5.1     Recent Labs Lab 04/21/16 1341 04/22/16 0355 04/23/16 0442  CREATININE 0.78 0.78 1.14   Estimated Creatinine Clearance: 103.3 mL/min (by C-G formula based on Cr of 1.14).    No Known Allergies  Antimicrobials this admission: 5/18 Zosyn >>  5/18 Vancomycin >>   Levels/dose changes this admission: 5/20 2130 VT = mcg/ml on 1gm q12h (prior to 4th dose)  Microbiology results: None ordered  Thank you for allowing pharmacy to be a part of this patient's care.  Doreene Eland, PharmD, BCPS.   Pager: [phone] 04/23/2016 12:46 PM

## 2016-04-23 NOTE — Progress Notes (Signed)
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 Sedimentation rate elevated. Patient with some clinical improvement. MRI of the left foot negative for osteomyelitis. Change IV vancomycin and IV Zosyn to oral Augmentin and doxycycline and treat for total of 7-10 days. Pain management. Follow.  #2 poorly controlled type 2 diabetes Hemoglobin A1c 15.6. Patient stated had run out of his 70/30 a few weeks ago. Increase Lantus to 45 units daily. Sliding scale insulin. Add NovoLog 4 units 3 times daily with meals for meal coverage.   #3 hypertension Improved. Continue Norvasc.   #4 history of CVA Stable. Continue aspirin for secondary stroke prevention. Risk factor modification.  #5 history of major depressive disorder Continue Cymbalta and trazodone. Outpatient follow-up with psychiatry.   DVT prophylaxis: Lovenox Code Status: Full Family Communication: Updated patient. No family at bedside. Disposition Plan: Home when medically stable hopefully in 1-2 days.   Consultants:   None  Procedures:   Plain films of the left foot 04/21/2016  MRI of the left foot 04/22/2016  ABI/TBI 04/22/2016  Antimicrobials:   IV vancomycin 04/21/2016>>>> 04/23/2016  IV Zosyn 04/21/2016>>>>> 04/23/2016  Oral Augmentin 04/23/2016  Oral doxycycline 04/23/2016   Subjective: Patient states his left foot ulcer pain improved. No  chest pain. No shortness of breath.   Objective: Filed Vitals:   04/22/16 2145 04/22/16 2323 04/23/16 0512 04/23/16 1410  BP: 120/85  142/98 129/79  Pulse: 57  70 77  Temp: 97.6 F (36.4 C)  98.2 F (36.8 C) 98.4 F (36.9 C)  TempSrc: Oral  Oral Oral  Resp: 16  18 16   Height:  6\' 3"  (1.905 m)    Weight:  117.028 kg (258 lb)    SpO2: 99%  100% 100%    Intake/Output Summary (Last 24 hours) at 04/23/16 1458 Last data filed at 04/23/16 1411  Gross per 24 hour  Intake   1560 ml  Output      0 ml  Net   1560 ml   Filed Weights   04/22/16 2323  Weight: 117.028 kg (258 lb)    Examination:  General exam: Appears calm and comfortable  Respiratory system: Clear to auscultation. Respiratory effort normal. Cardiovascular system: S1 & S2 heard, RRR. No JVD, murmurs, rubs, gallops or clicks. No pedal edema. Gastrointestinal system: Abdomen is nondistended, soft and nontender. No organomegaly or masses felt. Normal bowel sounds heard. Central nervous system: Alert and oriented. No focal neurological deficits. Extremities: Left foot planter region with a ulcer with less erythema and less edema. Less tender to palpation. Skin: No rashes, lesions or ulcers Psychiatry: Judgement and insight appear normal. Mood & affect appropriate.     Data Reviewed: I have personally reviewed following labs and imaging studies  CBC:  Recent Labs Lab 04/21/16 1341 04/22/16 0355 04/23/16 0442  WBC 6.3 6.3 5.1  NEUTROABS  --  4.3 2.9  HGB 12.3* 12.6* 13.6  HCT 36.6* 35.6* 40.2  MCV 89.3  85.4 88.9  PLT 198 189 [id number]   Basic Metabolic Panel:  Recent Labs Lab 04/21/16 1341 04/22/16 0355 04/23/16 0442  NA 138 134* 139  K 4.3 3.5 4.7  CL 102 100* 104  CO2 29 26 26   GLUCOSE 377* 310* 160*  BUN 9 7 11   CREATININE 0.78 0.78 1.14  CALCIUM 9.2 9.1 9.5  MG  --  2.0  --    GFR: Estimated Creatinine Clearance: 103.3 mL/min (by C-G formula based on Cr of 1.14). Liver Function Tests:  Recent  Labs Lab 04/22/16 0355 04/23/16 0442  AST 15 20  ALT 12* 14*  ALKPHOS 99 94  BILITOT 1.3* 1.5*  PROT 7.6 8.0  ALBUMIN 3.8 4.0   No results for input(s): LIPASE, AMYLASE in the last 168 hours. No results for input(s): AMMONIA in the last 168 hours. Coagulation Profile: No results for input(s): INR, PROTIME in the last 168 hours. Cardiac Enzymes: No results for input(s): CKTOTAL, CKMB, CKMBINDEX, TROPONINI in the last 168 hours. BNP (last 3 results) No results for input(s): PROBNP in the last 8760 hours. HbA1C:  Recent Labs  04/22/16 0355  HGBA1C 15.6*   CBG:  Recent Labs Lab 04/22/16 1229 04/22/16 1631 04/22/16 2151 04/23/16 0723 04/23/16 1143  GLUCAP 268* 135* 186* 207* 144*   Lipid Profile: No results for input(s): CHOL, HDL, LDLCALC, TRIG, CHOLHDL, LDLDIRECT in the last 72 hours. Thyroid Function Tests: No results for input(s): TSH, T4TOTAL, FREET4, T3FREE, THYROIDAB in the last 72 hours. Anemia Panel: No results for input(s): VITAMINB12, FOLATE, FERRITIN, TIBC, IRON, RETICCTPCT in the last 72 hours. Sepsis Labs: No results for input(s): PROCALCITON, LATICACIDVEN in the last 168 hours.  No results found for this or any previous visit (from the past 240 hour(s)).       Radiology Studies: Mr Foot Left W Wo Contrast  04/22/2016  CLINICAL DATA:  The patient is a 55 year old male with poorly controlled insulin-dependent diabetes, hypertension, depression, history of renal cell carcinoma in remission, presents to Emergency Department with left foot swelling, redness and pa.*comment was truncated*^73mL MULTIHANCE GADOBENATE DIMEGLUMINE 529 MG/ML IV SOLN EXAM: MRI OF THE LEFT FOREFOOT WITHOUT AND WITH CONTRAST TECHNIQUE: Multiplanar, multisequence MR imaging was performed both before and after administration of intravenous contrast. CONTRAST:  30mL MULTIHANCE GADOBENATE DIMEGLUMINE 529 MG/ML IV SOLN COMPARISON:  None. FINDINGS: Bones/Joint/Cartilage No marrow signal  abnormality. No fracture or dislocation. No joint effusion. Hallux valgus of the right foot. Otherwise normal alignment. Mild osteoarthritis of the first MTP joint. Collaterals Collateral ligaments are intact. Tendons Intact flexor and extensor compartment tendons. Muscles Normal. Soft tissue No fluid collection or hematoma. No soft tissue mass. Generalized soft tissue edema along the dorsal aspect of the foot which may be reactive versus secondary to mild cellulitis. IMPRESSION: 1. No evidence of osteomyelitis of the left forefoot. 2. Generalized soft tissue edema along the dorsal aspect of the foot which may be reactive versus secondary to mild cellulitis. Electronically Signed   By: Kathreen Devoid   On: 04/22/2016 16:28   Dg Foot Complete Left  04/21/2016  CLINICAL DATA:  55 year old male with left foot pain and swelling EXAM: LEFT FOOT - COMPLETE 3+ VIEW COMPARISON:  None. FINDINGS: There is a focal area of cortical irregularity and step-off at the base of the fifth metatarsal. This may be related to an old healed fracture. An acute fracture is not excluded. Correlation with clinical exam and evaluation for point tenderness over the lateral aspect of the foot recommended.  No other acute fracture identified. The bones are osteopenic there is bone spurring at the calcaneonavicular joint. There are degenerative changes of the head of the first metatarsal with multiple small cyst formation. There is mild soft tissue swelling over the medial aspect of the first MTP joint. No radiopaque foreign object or gas identified. There is diffuse soft tissue swelling of the mid and forefoot. IMPRESSION: Age indeterminate fracture of the base of the fifth metatarsal. Clinical correlation is recommended. Diffuse soft tissue swelling of the foot.  No soft tissue gas. Degenerative changes of the head of the first metatarsal as well as calcaneonavicular joint. Electronically Signed   By: Anner Crete M.D.   On: 04/21/2016 18:52         Scheduled Meds: . aspirin  325 mg Oral Daily  . atorvastatin  40 mg Oral q1800  . DULoxetine  60 mg Oral Daily  . enoxaparin (LOVENOX) injection  60 mg Subcutaneous Daily  . feeding supplement (GLUCERNA SHAKE)  237 mL Oral TID BM  . feeding supplement (PRO-STAT SUGAR FREE 64)  30 mL Oral BID  . insulin aspart  0-15 Units Subcutaneous TID WC  . insulin aspart  4 Units Subcutaneous TID WC  . insulin glargine  45 Units Subcutaneous Daily  . levETIRAcetam  500 mg Oral BID  . mupirocin cream   Topical Daily  . pantoprazole  40 mg Oral Daily  . piperacillin-tazobactam (ZOSYN)  IV  3.375 g Intravenous Q8H  . pregabalin  100 mg Oral Daily  . vancomycin  1,000 mg Intravenous Q12H   Continuous Infusions:     LOS: 2 days    Time spent: 32 minutes    THOMPSON,DANIEL, MD [facility] Pager [phone]  If 7PM-7AM, please contact night-coverage [email or web address] Password [email or web address] 04/23/2016, 2:58 PM

## 2016-04-24 LAB — GLUCOSE, CAPILLARY
GLUCOSE-CAPILLARY: 106 mg/dL — AB (ref 65–99)
Glucose-Capillary: 215 mg/dL — ABNORMAL HIGH (ref 65–99)

## 2016-04-24 LAB — CBC WITH DIFFERENTIAL/PLATELET
BASOS ABS: 0 10*3/uL (ref 0.0–0.1)
Basophils Relative: 1 %
Eosinophils Absolute: 0.1 10*3/uL (ref 0.0–0.7)
Eosinophils Relative: 3 %
HEMATOCRIT: 33.6 % — AB (ref 39.0–52.0)
Hemoglobin: 11.8 g/dL — ABNORMAL LOW (ref 13.0–17.0)
LYMPHS PCT: 54 %
Lymphs Abs: 2.4 10*3/uL (ref 0.7–4.0)
MCH: 30.3 pg (ref 26.0–34.0)
MCHC: 35.1 g/dL (ref 30.0–36.0)
MCV: 86.2 fL (ref 78.0–100.0)
Monocytes Absolute: 0.4 10*3/uL (ref 0.1–1.0)
Monocytes Relative: 8 %
NEUTROS ABS: 1.5 10*3/uL — AB (ref 1.7–7.7)
Neutrophils Relative %: 34 %
Platelets: 209 10*3/uL (ref 150–400)
RBC: 3.9 MIL/uL — AB (ref 4.22–5.81)
RDW: 12.1 % (ref 11.5–15.5)
WBC: 4.3 10*3/uL (ref 4.0–10.5)

## 2016-04-24 LAB — COMPREHENSIVE METABOLIC PANEL
ALK PHOS: 74 U/L (ref 38–126)
ALT: 12 U/L — AB (ref 17–63)
AST: 12 U/L — AB (ref 15–41)
Albumin: 3.4 g/dL — ABNORMAL LOW (ref 3.5–5.0)
Anion gap: 8 (ref 5–15)
BILIRUBIN TOTAL: 1 mg/dL (ref 0.3–1.2)
BUN: 18 mg/dL (ref 6–20)
CO2: 27 mmol/L (ref 22–32)
CREATININE: 1.44 mg/dL — AB (ref 0.61–1.24)
Calcium: 8.8 mg/dL — ABNORMAL LOW (ref 8.9–10.3)
Chloride: 104 mmol/L (ref 101–111)
GFR calc Af Amer: >60 mL/min (ref >60)
GFR, EST NON AFRICAN AMERICAN: 54 mL/min — AB (ref >60)
Glucose, Bld: 75 mg/dL (ref 65–99)
Potassium: 3.6 mmol/L (ref 3.5–5.1)
Sodium: 139 mmol/L (ref 135–145)
TOTAL PROTEIN: 7 g/dL (ref 6.5–8.1)

## 2016-04-24 MED ORDER — "INSULIN SYRINGE 31G X 5/16"" 0.3 ML MISC": 4.0000 [IU] | Freq: Three times a day (TID) | Status: DC

## 2016-04-24 MED ORDER — PRO-STAT SUGAR FREE PO LIQD: 30.0000 mL | Freq: Two times a day (BID) | ORAL | Status: DC

## 2016-04-24 MED ORDER — ATORVASTATIN CALCIUM 40 MG PO TABS: 40.0000 mg | ORAL_TABLET | Freq: Every day | ORAL | Status: DC

## 2016-04-24 MED ORDER — INSULIN GLARGINE 100 UNIT/ML ~~LOC~~ SOLN: 45.0000 [IU] | Freq: Every day | SUBCUTANEOUS | Status: DC

## 2016-04-24 MED ORDER — MUPIROCIN CALCIUM 2 % EX CREA: TOPICAL_CREAM | Freq: Every day | CUTANEOUS | Status: DC

## 2016-04-24 MED ORDER — BLOOD GLUCOSE METER KIT: PACK | Status: DC

## 2016-04-24 MED ORDER — GLUCERNA SHAKE PO LIQD: 237.0000 mL | Freq: Three times a day (TID) | ORAL | Status: DC

## 2016-04-24 MED ORDER — DULOXETINE HCL 60 MG PO CPEP: 60.0000 mg | ORAL_CAPSULE | Freq: Every day | ORAL | Status: DC

## 2016-04-24 MED ORDER — OXYCODONE-ACETAMINOPHEN 5-325 MG PO TABS: 1.0000 | ORAL_TABLET | ORAL | Status: DC | PRN

## 2016-04-24 MED ORDER — INSULIN ASPART 100 UNIT/ML ~~LOC~~ SOLN: 4.0000 [IU] | Freq: Three times a day (TID) | SUBCUTANEOUS | Status: DC

## 2016-04-24 MED ORDER — "INSULIN SYRINGE 31G X 5/16"" 0.5 ML MISC": 45.0000 [IU] | Freq: Every day | Status: DC

## 2016-04-24 MED ORDER — LEVETIRACETAM 500 MG PO TABS: 500.0000 mg | ORAL_TABLET | Freq: Two times a day (BID) | ORAL | Status: DC

## 2016-04-24 MED ORDER — DOXYCYCLINE HYCLATE 100 MG PO TABS: 100.0000 mg | ORAL_TABLET | Freq: Two times a day (BID) | ORAL | Status: DC

## 2016-04-24 MED ORDER — PREGABALIN 100 MG PO CAPS: 100.0000 mg | ORAL_CAPSULE | Freq: Every day | ORAL | Status: DC

## 2016-04-24 MED ORDER — AMOXICILLIN-POT CLAVULANATE 875-125 MG PO TABS: 1.0000 | ORAL_TABLET | Freq: Two times a day (BID) | ORAL | Status: AC

## 2016-04-24 NOTE — Progress Notes (Signed)
Assessment unchanged. Pt and fiance verbalized understanding of dc instructions through teach back regarding follow up care and when to call the doctor. Dressing change done to lt foot and fiance verbalized understanding of how to dress post dc. Dressing supplies provided. Multiple scripts provided by MD and thoroughly reviewed with pt and fiance. Discharged via wc to front entrance accompanied by fiance and NT.

## 2016-04-24 NOTE — Discharge Summary (Signed)
Physician Discharge Summary  Brandon [name] GMW:[id number] DOB: 04-27-1961 DOA: 04/21/2016  PCP: [name], PA-C  Admit date: 04/21/2016 Discharge date: 04/24/2016  Time spent: 65 minutes  Recommendations for Outpatient Follow-up:  1. Follow-up with [name], PA-C in 1-2 weeks. On follow-up patient's diabetes will need to be reassessed as well as patient's diabetic foot ulcer.   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  Filed Weights   04/22/16 2323  Weight: 117.028 kg (258 lb)    History of present illness:  Per Dr Gunnar Fusi is a 55 y.o. male with medical history significant of up retention, diverticulosis, renal cell CA, stroke, diabetes mellitus, depression comes to the emergency department due to sore left foot and swelling.   Per patient, a few weeks ago he noticed a small lesion on his foot. He stated that last week, he manipulated it causing temporary bleeding. He did not have significant symptoms until a few days later, when he started noticing increased erythema, edema, calor and tenderness of the area associated with subjective fever, chills and night sweats. He stated that he has had 2-3 episodes of emesis for the past 3 days with 2-3 episodes of mild loose stools. He also complained of blurred vision, polyuria, polydipsia. He went to see his primary care provider today, who suggested for him to come to the emergency department. He denied chest pain, palpitations, dizziness, diaphoresis, PND orthopnea.    ED Course: The  patient received analgesics, antiemetics, IV fluids and IV antibiotics. He underwent I&D of the lesions. He stated that he felt better.  Hospital Course:  #1 left foot diabetic ulcer with secondary diabetes mellitus Plain films of the left foot consistent with a soft tissue swelling. Sedimentation rate elevated. Patient was admitted and placed empirically on IV vancomycin and IV Zosyn. Patient was monitored. On presentation to the ED it was noted per physician the patient had a focal abscess on the plantar aspect of his foot. Patient subsequently underwent an opening of the foot abscess with large amount of purulent drainage is noted. ED note. Wound cultures were not obtained. Patient was admitted improved clinically on a daily basis. MRI of the left foot negative for osteomyelitis. Patient was subsequently transitioned from IV vancomycin and IV Zosyn to oral Augmentin and oral doxycycline. Patient be discharged home on 5 more days of oral antibiotics to complete a one-week course of antibiotic treatment. Patient wanted to follow-up with PCP as outpatient.   #2 poorly controlled type 2 diabetes Hemoglobin A1c 15.6. Patient stated had run out of his 70/30 a few weeks ago. Patient stated somebody at purchase Lantus for him. Patient was maintained on Lantus during the hospitalization and dose adjusted to 45 units daily. Patient was also maintained on a sliding scale insulin as well as meal coverage NovoLog. Patient's CBGs improved during the hospitalization. Outpatient follow-up.  #3 hypertension Improved. Blood pressure remained stable outpatient follow-up.  #4 history of CVA Stable. Continued on aspirin for secondary stroke prevention. Risk factor modification.  #5 history of major depressive disorder Continued on home regimen of Cymbalta and trazodone. Outpatient  follow-up with psychiatry.  Procedures:  Plain films of the left foot 04/21/2016  MRI of the left foot 04/22/2016  ABI/TBI  04/22/2016  Consultations:  None  Discharge Exam: Filed Vitals:   04/23/16 2120 04/24/16 0550  BP: 115/77 122/61  Pulse: 58 63  Temp: 97.3 F (36.3 C) 97.5 F (36.4 C)  Resp: 18 18    General: NAD Cardiovascular: RRR Respiratory: CTAB  Discharge Instructions   Discharge Instructions    Diet Carb Modified    Complete by:  As directed      Discharge instructions    Complete by:  As directed   Follow up with TYSINGER, DAVID SHANE, PA-C in 1-2 weeks     Increase activity slowly    Complete by:  As directed           Current Discharge Medication List    START taking these medications   Details  Amino Acids-Protein Hydrolys (FEEDING SUPPLEMENT, PRO-STAT SUGAR FREE 64,) LIQD Take 30 mLs by mouth 2 (two) times daily. Qty: 900 mL, Refills: 0    amoxicillin-clavulanate (AUGMENTIN) 875-125 MG tablet Take 1 tablet by mouth every 12 (twelve) hours. Take for 5 days then stop. Qty: 10 tablet, Refills: 0    blood glucose meter kit and supplies Dispense based on patient and insurance preference. Use up to four times daily as directed. (FOR ICD-9 250.00, 250.01). Qty: 1 each, Refills: 0    doxycycline (VIBRA-TABS) 100 MG tablet Take 1 tablet (100 mg total) by mouth every 12 (twelve) hours. Take for 5 days then stop. Qty: 10 tablet, Refills: 0    feeding supplement, GLUCERNA SHAKE, (GLUCERNA SHAKE) LIQD Take 237 mLs by mouth 3 (three) times daily between meals. Refills: 0    insulin aspart (NOVOLOG) 100 UNIT/ML injection Inject 4 Units into the skin 3 (three) times daily with meals. Qty: 10 mL, Refills: 0    Insulin Syringe-Needle U-100 (INSULIN SYRINGE .3CC/31GX5/16") 31G X 5/16" 0.3 ML MISC 4 Units by Does not apply route 3 (three) times daily. Qty: 100 each, Refills: 0    Insulin Syringe-Needle U-100 (INSULIN SYRINGE .5CC/31GX5/16") 31G X 5/16" 0.5 ML MISC 45 Units by Does not apply route daily. Qty: 100 each, Refills: 0    mupirocin cream (BACTROBAN) 2 % Apply  topically daily. Qty: 15 g, Refills: 0    oxyCODONE-acetaminophen (PERCOCET/ROXICET) 5-325 MG tablet Take 1-2 tablets by mouth every 4 (four) hours as needed for moderate pain or severe pain. Qty: 20 tablet, Refills: 0      CONTINUE these medications which have CHANGED   Details  atorvastatin (LIPITOR) 40 MG tablet Take 1 tablet (40 mg total) by mouth daily. Qty: 30 tablet, Refills: 0    DULoxetine (CYMBALTA) 60 MG capsule Take 1 capsule (60 mg total) by mouth daily. Qty: 30 capsule, Refills: 0    insulin glargine (LANTUS) 100 UNIT/ML injection Inject 0.45 mLs (45 Units total) into the skin daily. Qty: 10 mL, Refills: 0    levETIRAcetam (KEPPRA) 500 MG tablet Take 1 tablet (500 mg total) by mouth 2 (two) times daily. Qty: 60 tablet, Refills: 0    pregabalin (LYRICA) 100 MG capsule Take 1 capsule (100 mg total) by mouth daily. Qty: 30 capsule, Refills: 0      CONTINUE these medications which have NOT CHANGED   Details  aspirin 325 MG tablet Take 1 tablet (325 mg total) by mouth daily. Qty: 30 tablet, Refills: 11      STOP taking these medications  Canagliflozin-Metformin HCl (INVOKAMET) 50-1000 MG TABS      traZODone (DESYREL) 50 MG tablet        No Known Allergies Follow-up Information    Follow up with [name], PA-C. Schedule an appointment as soon as possible for a visit in 1 week.   Specialty:  Family Medicine   Why:  f/u in 1-2 weeks.   Contact information:   [address] [place] [address] [phone]        The results of significant diagnostics from this hospitalization (including imaging, microbiology, ancillary and laboratory) are listed below for reference.    Significant Diagnostic Studies: Mr Foot Left W Wo Contrast  04/22/2016  CLINICAL DATA:  The patient is a 56 year old male with poorly controlled insulin-dependent diabetes, hypertension, depression, history of renal cell carcinoma in remission, presents to Emergency  Department with left foot swelling, redness and pa.*comment was truncated*^8m MULTIHANCE GADOBENATE DIMEGLUMINE 529 MG/ML IV SOLN EXAM: MRI OF THE LEFT FOREFOOT WITHOUT AND WITH CONTRAST TECHNIQUE: Multiplanar, multisequence MR imaging was performed both before and after administration of intravenous contrast. CONTRAST:  239mMULTIHANCE GADOBENATE DIMEGLUMINE 529 MG/ML IV SOLN COMPARISON:  None. FINDINGS: Bones/Joint/Cartilage No marrow signal abnormality. No fracture or dislocation. No joint effusion. Hallux valgus of the right foot. Otherwise normal alignment. Mild osteoarthritis of the first MTP joint. Collaterals Collateral ligaments are intact. Tendons Intact flexor and extensor compartment tendons. Muscles Normal. Soft tissue No fluid collection or hematoma. No soft tissue mass. Generalized soft tissue edema along the dorsal aspect of the foot which may be reactive versus secondary to mild cellulitis. IMPRESSION: 1. No evidence of osteomyelitis of the left forefoot. 2. Generalized soft tissue edema along the dorsal aspect of the foot which may be reactive versus secondary to mild cellulitis. Electronically Signed   By: HeKathreen Devoid On: 04/22/2016 16:28   Dg Foot Complete Left  04/21/2016  CLINICAL DATA:  [age]ear old male with left foot pain and swelling EXAM: LEFT FOOT - COMPLETE 3+ VIEW COMPARISON:  None. FINDINGS: There is a focal area of cortical irregularity and step-off at the base of the fifth metatarsal. This may be related to an old healed fracture. An acute fracture is not excluded. Correlation with clinical exam and evaluation for point tenderness over the lateral aspect of the foot recommended. No other acute fracture identified. The bones are osteopenic there is bone spurring at the calcaneonavicular joint. There are degenerative changes of the head of the first metatarsal with multiple small cyst formation. There is mild soft tissue swelling over the medial aspect of the first MTP joint. No  radiopaque foreign object or gas identified. There is diffuse soft tissue swelling of the mid and forefoot. IMPRESSION: Age indeterminate fracture of the base of the fifth metatarsal. Clinical correlation is recommended. Diffuse soft tissue swelling of the foot.  No soft tissue gas. Degenerative changes of the head of the first metatarsal as well as calcaneonavicular joint. Electronically Signed   By: ArAnner Crete.D.   On: 04/21/2016 18:52    Microbiology: No results found for this or any previous visit (from the past 240 hour(s)).   Labs: Basic Metabolic Panel:  Recent Labs Lab 04/21/16 1341 04/22/16 0355 04/23/16 0442 04/24/16 0431  NA 138 134* 139 139  K 4.3 3.5 4.7 3.6  CL 102 100* 104 104  CO2 '29 26 26 27  ' GLUCOSE 377* 310* 160* 75  BUN '9 7 11 18  ' CREATININE 0.78 0.78 1.14 1.44*  CALCIUM 9.2 9.1 9.5 8.8*  MG  --  2.0  --   --    Liver Function Tests:  Recent Labs Lab 04/22/16 0355 04/23/16 0442 04/24/16 0431  AST 15 20 12*  ALT 12* 14* 12*  ALKPHOS 99 94 74  BILITOT 1.3* 1.5* 1.0  PROT 7.6 8.0 7.0  ALBUMIN 3.8 4.0 3.4*   No results for input(s): LIPASE, AMYLASE in the last 168 hours. No results for input(s): AMMONIA in the last 168 hours. CBC:  Recent Labs Lab 04/21/16 1341 04/22/16 0355 04/23/16 0442 04/24/16 0431  WBC 6.3 6.3 5.1 4.3  NEUTROABS  --  4.3 2.9 1.5*  HGB 12.3* 12.6* 13.6 11.8*  HCT 36.6* 35.6* 40.2 33.6*  MCV 89.3 85.4 88.9 86.2  PLT 198 189 218 209   Cardiac Enzymes: No results for input(s): CKTOTAL, CKMB, CKMBINDEX, TROPONINI in the last 168 hours. BNP: BNP (last 3 results) No results for input(s): BNP in the last 8760 hours.  ProBNP (last 3 results) No results for input(s): PROBNP in the last 8760 hours.  CBG:  Recent Labs Lab 04/23/16 1143 04/23/16 1711 04/23/16 2002 04/24/16 0800 04/24/16 1224  GLUCAP 144* 200* 236* 106* 215*       Signed:  Jessicca Stitzer MD.  [facility] 04/24/2016, 1:13  PM

## 2016-04-24 NOTE — Evaluation (Signed)
Physical Therapy Evaluation Patient Details Name: Brandon [name] MRN: [id number] DOB: 10-20-1961 Today's Date: 04/24/2016   History of Present Illness  Brandon [name] is a 55 y.o. male adm with L foot swelling, diabetic ulcer; medical history significant of up retention, diverticulosis, renal cell CA, stroke, diabetes mellitus  Clinical Impression  Pt admitted with above diagnosis. Pt currently with functional limitations due to the deficits listed below (see PT Problem List).   Pt will benefit from skilled PT to increase their independence and safety with mobility to allow discharge to the venue listed below.  Pt very pleasant and motivated to work with PT; will continue to follow for gait and balance training     Follow Up Recommendations No PT follow up    Equipment Recommendations  Kasandra Knudsen (if insurance will pay for it per pt)    Recommendations for Other Services       Precautions / Restrictions Precautions Precautions: Fall Restrictions Weight Bearing Restrictions: No      Mobility  Bed Mobility               General bed mobility comments: NT--on EOB  Transfers Overall transfer level: Needs assistance Equipment used: None Transfers: Sit to/from Stand Sit to Stand: Supervision         General transfer comment: cues for safety and hand placement  Ambulation/Gait Ambulation/Gait assistance: Min guard;Min assist Ambulation Distance (Feet): 140 Feet Assistive device: Straight cane Gait Pattern/deviations: Step-through pattern;Drifts right/left;Decreased weight shift to left     General Gait Details: cues for sequencing with cane, pt with gait hesitation, delayed balance reactions, LOB x3 during gait with min/guard to recover  Stairs            Wheelchair Mobility    Modified Rankin (Stroke Patients Only)       Balance Overall balance assessment: History of Falls;Needs assistance   Sitting balance-Leahy Scale: Good       Standing  balance-Leahy Scale: Fair               High level balance activites: Turns;Direction changes;Head turns High Level Balance Comments: LOB x 3 during gait with min/guard to recover, delayed reactions             Pertinent Vitals/Pain Pain Assessment: 0-10 Pain Score: 2  Pain Location: L foot with incr activity Pain Descriptors / Indicators: Aching Pain Intervention(s): Limited activity within patient's tolerance;Monitored during session    Home Living Family/patient expects to be discharged to:: Private residence   Available Help at Discharge: Friend(s);Available PRN/intermittently Type of Home: Apartment Home Access: Stairs to enter Entrance Stairs-Rails: Right Entrance Stairs-Number of Steps: 2 flights Home Layout: One level Home Equipment: None      Prior Function Level of Independence: Independent               Hand Dominance        Extremity/Trunk Assessment   Upper Extremity Assessment: Overall WFL for tasks assessed           Lower Extremity Assessment: LLE deficits/detail   LLE Deficits / Details: AAROM pf/df grossly WFL; knee and hip grossly 3+ to 4/5;  (pt reports residual weakness from CVA)     Communication   Communication: No difficulties  Cognition Arousal/Alertness: Awake/alert Behavior During Therapy: WFL for tasks assessed/performed Overall Cognitive Status: Within Functional Limits for tasks assessed                      General Comments  Exercises        Assessment/Plan    PT Assessment Patient needs continued PT services  PT Diagnosis Difficulty walking   PT Problem List Decreased balance;Decreased knowledge of use of DME;Decreased coordination  PT Treatment Interventions DME instruction;Gait training;Functional mobility training;Therapeutic activities;Therapeutic exercise;Stair training;Patient/family education;Balance training   PT Goals (Current goals can be found in the Care Plan section) Acute  Rehab PT Goals Patient Stated Goal: get stronger PT Goal Formulation: With patient Time For Goal Achievement: 05/01/16 Potential to Achieve Goals: Good    Frequency Min 3X/week   Barriers to discharge        Co-evaluation               End of Session Equipment Utilized During Treatment: Gait belt Activity Tolerance: Patient tolerated treatment well Patient left: with call bell/phone within reach;with family/visitor present (EOB as he was on PT arrival) Nurse Communication: Mobility status         Time: [id number] PT Time Calculation (min) (ACUTE ONLY): 20 min   Charges:   PT Evaluation $PT Eval Low Complexity: 1 Procedure     PT G Codes:        Khalia Gong 04-30-16, 10:31 AM

## 2016-04-24 NOTE — Care Management (Signed)
CM spoke with patient's nurse, Nevin Bloodgood. Merry Proud at [facility] will deliver a cane to patient's room. CM spoke with patient and informed him a can will be delivered to his room. Presenter, broadcasting BSN CCM

## 2016-04-25 ENCOUNTER — Telehealth: Payer: Self-pay | Admitting: Medical

## 2016-04-25 NOTE — Telephone Encounter (Signed)
Left message for Brandon [name] to call back. Need to speak to her concerning Brandon [name]'s recent hospitalization.

## 2016-04-25 NOTE — Telephone Encounter (Signed)
Tried to call pt again

## 2016-04-26 NOTE — Telephone Encounter (Signed)
Left another message for pt to call. Pt needs a hospital follow up appt.

## 2016-05-04 ENCOUNTER — Telehealth: Payer: Self-pay | Admitting: Medical

## 2016-05-04 NOTE — Telephone Encounter (Signed)
[facility] Endocrin[facility] called and state that they are not currentely taking any new medicaid patients at this time.so they are not able to take him.

## 2016-05-04 NOTE — Telephone Encounter (Signed)
Sent to [facility]

## 2016-05-09 ENCOUNTER — Encounter: Payer: Self-pay | Admitting: Medical

## 2016-05-09 ENCOUNTER — Ambulatory Visit (INDEPENDENT_AMBULATORY_CARE_PROVIDER_SITE_OTHER): Payer: Medicaid Other | Admitting: Medical

## 2016-05-09 VITALS — BP 140/92 | HR 83 | Wt 269.0 lb

## 2016-05-09 DIAGNOSIS — Z794 Long term (current) use of insulin: Secondary | ICD-10-CM | POA: Diagnosis not present

## 2016-05-09 DIAGNOSIS — I639 Cerebral infarction, unspecified: Secondary | ICD-10-CM

## 2016-05-09 DIAGNOSIS — IMO0001 Reserved for inherently not codable concepts without codable children: Secondary | ICD-10-CM

## 2016-05-09 DIAGNOSIS — L97529 Non-pressure chronic ulcer of other part of left foot with unspecified severity: Secondary | ICD-10-CM | POA: Diagnosis not present

## 2016-05-09 DIAGNOSIS — E08621 Diabetes mellitus due to underlying condition with foot ulcer: Secondary | ICD-10-CM

## 2016-05-09 DIAGNOSIS — F0631 Mood disorder due to known physiological condition with depressive features: Secondary | ICD-10-CM

## 2016-05-09 DIAGNOSIS — E1165 Type 2 diabetes mellitus with hyperglycemia: Secondary | ICD-10-CM | POA: Diagnosis not present

## 2016-05-09 DIAGNOSIS — G894 Chronic pain syndrome: Secondary | ICD-10-CM

## 2016-05-09 DIAGNOSIS — I69322 Dysarthria following cerebral infarction: Secondary | ICD-10-CM | POA: Diagnosis not present

## 2016-05-09 DIAGNOSIS — Z8673 Personal history of transient ischemic attack (TIA), and cerebral infarction without residual deficits: Secondary | ICD-10-CM

## 2016-05-09 DIAGNOSIS — R200 Anesthesia of skin: Secondary | ICD-10-CM

## 2016-05-09 DIAGNOSIS — IMO0002 Reserved for concepts with insufficient information to code with codable children: Secondary | ICD-10-CM

## 2016-05-09 NOTE — Patient Instructions (Signed)
Recommendations:  Continue Lantus 45 units at bedtime   Use the following sliding scale for meal time insulin  Stop Novolog 70/30 mix insulin for now  We are referring you to pain management  Begin using cane and post op shoe  Make an appointment with a psychiatrist  Follow up here in 1 week   Correction Insulin/Sliding Scale Your caregiver has decided you need insulin at home. You have been given a correctional scale (sliding scale) in case you need extra insulin when your blood sugar is too high (hyperglycemia). The following instructions will assist you in how to use that correctional scale.  WHAT IS A CORRECTIONAL SCALE (SLIDING SCALE)?  When you check your blood sugar, sometimes it will be higher than your caregiver wants it to be. You may need an extra dose of insulin to bring your blood sugar to your desired level (also known as your goal, target level, or normal level.) The correctional scale is prescribed by your caregiver based on your specific needs.   ______________________________________________________________________  INSULIN SLIDING SCALE   Use the chart below to determine the amount of your Novolog Insulin that you will use to control your meal time blood sugar.  If your glucose before meal is less than 60, drink 4 oz of orange juice or if able, eat a piece of candy and do not use the meal time dose of insulin  If your glucose before meal is 60 -100, don't use the meal time insulin for this meal If your glucose before meal is 101-150, use  10  units of Insulin  If your glucose before meal is 151-200, use  13  units of Insulin  If your glucose before meal is 201-250, use  16  units of Insulin If your glucose before meal is 251-300, use  19  units of Insulin If your glucose before meal is 301-350, use  22  units of Insulin If your glucose before meal is 351-400, use  25  units of Insulin If your glucose before meal is 451-500, use  28  units of Insulin If  your glucose before meal is >500, use 30 units of Insulin and call doctor immediately  ________________________________________________________________________    WHY IS IT IMPORTANT TO KEEP YOUR BLOOD SUGAR LEVELS AT YOUR DESIRED LEVEL?  It helps to prevent long-term complications of diabetes, such as eye disease, kidney failure, and other serious complications. WHAT TYPE OF INSULIN WILL YOU USE?  To help bring down blood sugars that are too high, your caregiver has prescribed a short-acting or a rapid-acting insulin. An example of a short-acting insulin would be Regular.  WHAT DO I NEED TO DO?   Check your blood sugar with your home blood glucose meter as recommended by your caregiver.  Using your correctional scale, find the range your blood sugar lies in.  Look for the units of insulin that matches the blood sugar range. Give yourself the dose of correctional insulin your caregiver has prescribed. Always make sure you are using the right type of insulin.  Prior to the injection make sure you have food available that you can eat in the next 15 to 30 minutes.  If your correctional insulin is rapid acting, start eating your meal within 15 minutes after you have given yourself the insulin injection. If you wait longer than 15 minutes to eat, your blood sugar might get too low.  If your correctional insulin is short acting (Regular), start eating your meal within 30 minutes after  you have given yourself the insulin injection. If you wait longer than 30 minutes to eat, your blood sugar might get too low. Symptoms of low blood sugar (hypoglycemia) may include feeling shaky or weak, sweating a lot, not thinking straight, difficulty seeing, agitation, or crankiness. Check your blood sugar immediately and treat your results as directed by your caregiver.  Keep a log of your blood sugar results with the time you took the test and the amount of insulin that you injected. This information will  help your caregiver manage your medications.  Note on your log anything that may affect your blood sugars such as:  Changes in normal exercise or activity.  Changes in your normal schedule, such as staying up late, going on vacation, changing your diet, or holidays.  New medications. This includes all medications. Some medications, even those that do not require a prescription, may cause high blood sugars.  Illness or stress.  Changes in when you actually took your medication.  Changes in your meals, such as skipping a meal, a late meal, or dining out.  Eating things that may affect blood glucose, such as snacks, larger meal portions than normal, or drinks with sugar.  Ask your caregiver any questions you have.    Psychiatry and [place] [phone] [facility], [place], [place], [place] [address]  [facility] Address: [address], [place], [place] [address] Phone: [phone]   Dr. Launa Flight [phone]   Dr. Sheralyn Boatman, psychiatry [phone]  [email or web address] [address] [place], [place], [place] [address]   [facility] [phone] office [email or web address] [address] [place], [place] [address]   Dr. Chucky May, psychiatry [phone] [place], [place], [place] [address]

## 2016-05-09 NOTE — Progress Notes (Signed)
Subjective: Chief Complaint  Patient presents with  . Follow-up    looking better. staying off foot when he can.    Here for f/u.  Accompanied by wife/significant other.  I saw him 04/21/16 for diabetic foot wound and ulcer.  I sent him to the ED that day.  He was discharged on 04/24/16, was on antibiotic for 5 additional days after d/c after being on IV antibiotics.  Had I&D in the ED on 04/21/16.   He does feel improved, no foot swelling or redness now.  Still has some localized pain on bottom of left foot but improved.  Has chunk of skin hanging out on foot he has questions about.  Using mupirocin ointment topical. He has finished the antibiotic  Using lyrica nad oxycodone for pain  He has questions about psychiatry vs psychology consult  Diabetes - he had run out of Novolog 70/30 prior to 04/21/16.  In the hospital his sugars were managed with Lantus 45 u and sliding scale Novolog which he has at home currently.  He notes using 43 u Lantus over the last 5 days and using Novolog 40 units once to twice daily but he doesn't seem to clear on this.   He reports not having 70/30 at this time . Not checking glucose but he has script to go get glucometer and testing supplies and cane today from pharmacy.   He does note having some low sugar readings readings with the insulin changes.  Past Medical History  Diagnosis Date  . Hypertension   . Diverticulitis     s/p micorperforation Sept 2012-managed conservatively by Gen surgery  . Kidney tumor 09/2011    Renal cell CA  . Wears glasses   . ED (erectile dysfunction)   . 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 Acute ischemic stroke ([place]) 11/2013  . Focal seizure ([place]) 11/2013    due to ischemic stroke  . Diabetes mellitus     DKA prior hospitalization  . Depression     BH Adm in [place] Depression   ROS as in subjective    Objective: BP 140/92 mmHg  Pulse 83  Wt 269 lb (122.018 kg)  Gen: wd, wn,  nad Left volar foot with a 1.5 cm round ulcer with pink granulation tissue under MTP, there is a 4cm fleshy dead tissue handing off the volar area under MTP close to the other ulcer Tender in same area as the ulcer otherwise much less swelling of foot compared to last visit.   No warmth, erythema. pulses 1+ left foot   Assessment: Encounter Diagnoses  Name Primary?  . 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 Uncontrolled type 2 diabetes mellitus without complication, with long-term current use of insulin ([place])   . Numbness   . Chronic pain syndrome   . Depression due to stroke ([place])   . Dysarthria, post-stroke   . History of stroke       Plan: Diabetic foot ulcer - reviewed hospital d/c summary.   Begin post op shoe on left, script given.  Use the mupirocin ointment TID.  I clipped off the dead skin on the volar left foot with sterile scissors after cleaning with betadine.  Discussed wound care, and f/u 1 wk  Diabetes -  Continue Lantus 45 units at bedtime   Gave a new sliding scale for meal time insulin Novolog.  This was changed  from Novolog mix with this hospitalization.  Stop Novolog 70/30 mix insulin for now  He does have upcoming endocrinology appt that we set up  He will go and get glucometer and supplies today to start monitoring sugars  Chronic pain - in the past given issues with uncontrolled pain, hospitalizations partly related to pain, he will c/t Lyrica, Oxycodone prn, and referral to pain clinic  Depression - similar to pain, given chronic history, psychiatric hospitalizations, advised he establish with outpatient psychiatry despite the fact that he feels ok for now.   Gave list of resources to establish with psychiatry and counseling  F/u  1 wk.

## 2016-05-16 ENCOUNTER — Encounter: Payer: Self-pay | Admitting: Medical

## 2016-05-16 ENCOUNTER — Ambulatory Visit (INDEPENDENT_AMBULATORY_CARE_PROVIDER_SITE_OTHER): Payer: Medicaid Other | Admitting: Medical

## 2016-05-16 VITALS — BP 106/80 | HR 73 | Wt 266.0 lb

## 2016-05-16 DIAGNOSIS — I10 Essential (primary) hypertension: Secondary | ICD-10-CM

## 2016-05-16 DIAGNOSIS — F332 Major depressive disorder, recurrent severe without psychotic features: Secondary | ICD-10-CM

## 2016-05-16 DIAGNOSIS — G894 Chronic pain syndrome: Secondary | ICD-10-CM

## 2016-05-16 DIAGNOSIS — E08621 Diabetes mellitus due to underlying condition with foot ulcer: Secondary | ICD-10-CM | POA: Diagnosis not present

## 2016-05-16 DIAGNOSIS — L97529 Non-pressure chronic ulcer of other part of left foot with unspecified severity: Secondary | ICD-10-CM | POA: Diagnosis not present

## 2016-05-16 DIAGNOSIS — L609 Nail disorder, unspecified: Secondary | ICD-10-CM | POA: Diagnosis not present

## 2016-05-16 DIAGNOSIS — E118 Type 2 diabetes mellitus with unspecified complications: Secondary | ICD-10-CM

## 2016-05-16 DIAGNOSIS — Z794 Long term (current) use of insulin: Secondary | ICD-10-CM | POA: Diagnosis not present

## 2016-05-16 DIAGNOSIS — L608 Other nail disorders: Secondary | ICD-10-CM

## 2016-05-16 MED ORDER — INSULIN ASPART 100 UNIT/ML ~~LOC~~ SOLN: 15.0000 [IU] | Freq: Three times a day (TID) | SUBCUTANEOUS | Status: DC

## 2016-05-16 MED ORDER — DULOXETINE HCL 60 MG PO CPEP: 60.0000 mg | ORAL_CAPSULE | Freq: Every day | ORAL | Status: DC

## 2016-05-16 MED ORDER — INSULIN GLARGINE 100 UNIT/ML ~~LOC~~ SOLN: 45.0000 [IU] | Freq: Every day | SUBCUTANEOUS | Status: DC

## 2016-05-16 MED ORDER — GLUCERNA HUNGER SMART SHAKE PO LIQD: 1.0000 | Freq: Two times a day (BID) | ORAL | Status: DC

## 2016-05-16 MED ORDER — LISINOPRIL 10 MG PO TABS: 10.0000 mg | ORAL_TABLET | Freq: Every day | ORAL | Status: DC

## 2016-05-16 MED ORDER — PREGABALIN 100 MG PO CAPS: 100.0000 mg | ORAL_CAPSULE | Freq: Every day | ORAL | Status: DC

## 2016-05-16 NOTE — Addendum Note (Signed)
Addended by: Billie Lade on: 05/16/2016 12:04 PM   Modules accepted: Orders

## 2016-05-16 NOTE — Progress Notes (Signed)
Subjective: Chief Complaint  Patient presents with  . follow up    looking better, and pt wants to know if he can be put on a different fluid pill because of his kidneys.    Here for f/u.  I saw him 05/09/16.    He has been using the mupirocin for the foot ulcer.  Improving.  Wasn't able to get the post op shoe.   Using glucometer, glucose running 90s most of the time.   Using Lantus 45 u QHS, using sliding scale Novolog.  Numbers looking a lot better.  Since last visit hasn't made appt with psychiatrist yet.  [facility]'t [facility] insurance.  hasn't heard back from [place] about pain clinic.   Using the lyrica.    No other new c/o.  Past Medical History  Diagnosis Date  . Hypertension   . Diverticulitis     s/p micorperforation Sept 2012-managed conservatively by Gen surgery  . Kidney tumor 09/2011    Renal cell CA  . Wears glasses   . ED (erectile dysfunction)   . 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 Acute ischemic stroke ([place]) 11/2013  . Focal seizure ([place]) 11/2013    due to ischemic stroke  . Diabetes mellitus     DKA prior hospitalization  . Depression     BH Adm in [place] Depression   ROS as in subjective   Objective: BP 106/80 mmHg  Pulse 73  Wt 266 lb (120.657 kg)  BP Readings from Last 3 Encounters:  05/16/16 106/80  05/09/16 140/92  04/24/16 122/61   Wt Readings from Last 3 Encounters:  05/16/16 266 lb (120.657 kg)  05/09/16 269 lb (122.018 kg)  04/22/16 258 lb (117.028 kg)   Gen: wd, wn, nad Left volar foot with a 1 cm round ulcer with pink granulation tissue under MTP, there is a 1cm fleshy dead tissue handing off the volar area under MTP close to the other ulcer Minimal tenderness in same area as the ulcer otherwise much less swelling of foot compared to last visit. No warmth, erythema. pulses 1+ left foot Thickened toenails throughout    Assessment: Encounter Diagnoses  Name Primary?  . Diabetic  ulcer of left foot associated with secondary diabetes mellitus ([place]) Yes  . Severe episode of recurrent major depressive disorder, without psychotic features ([place])   . Type 2 diabetes mellitus with complication, with long-term current use of insulin ([place])   . Essential hypertension   . Chronic pain syndrome   . Toenail deformity      Plan: Diabetic foot ulcer -c.t using mupirocin ointment TID, get donut pad OTC to take pressure off the wound, referral to podiatry  Diabetes - c/t lantus 45 u QHS, c/t sliding scale Novolog meal time insulin.  He is pending endocrinology appt  chronic pain - c/t Lyrica, oxycodone prn, and we have made referral to pain clinic.      Depression - increase Cymbalta to 60mg  BID.  Advised he make an appt with physiatry this week.    hypertension - restart Lisinopril for renal protection and hypertension.  Used sterile razor to trim off remaining dead skin chunk from overlying the ulcer area from last visit  F/u in 6 wk diabetes check.  Arnulfo was seen today for follow up.  Diagnoses and all orders for this visit:  Diabetic ulcer of left foot associated with secondary diabetes mellitus ([place]) -  Ambulatory referral to Podiatry  Severe episode of recurrent major depressive disorder, without psychotic features ([place])  Type 2 diabetes mellitus with complication, with long-term current use of insulin ([facility])  Essential hypertension  Chronic pain syndrome  Toenail deformity  Other orders -     DULoxetine (CYMBALTA) 60 MG capsule; Take 1 capsule (60 mg total) by mouth daily. -     lisinopril (PRINIVIL,ZESTRIL) 10 MG tablet; Take 1 tablet (10 mg total) by mouth daily. -     Nutritional Supplements (GLUCERNA HUNGER SMART SHAKE) LIQD; Take 1 Bottle by mouth 2 (two) times daily. As a meal replacement -     insulin glargine (LANTUS) 100 UNIT/ML injection; Inject 0.45 mLs (45 Units total) into the skin daily. -     insulin aspart (NOVOLOG) 100 UNIT/ML  injection; Inject 15 Units into the skin 3 (three) times daily with meals. -     pregabalin (LYRICA) 100 MG capsule; Take 1 capsule (100 mg total) by mouth daily.

## 2016-05-16 NOTE — Patient Instructions (Signed)
Call to make appt with psychiatry Dr. Casimiro Needle Address: [address], [place], [place] [address] Phone: [phone]

## 2016-05-23 ENCOUNTER — Telehealth: Payer: Self-pay

## 2016-05-23 MED ORDER — GLUCERNA HUNGER SMART SHAKE PO LIQD: 1.0000 | Freq: Two times a day (BID) | ORAL | Status: DC

## 2016-05-23 NOTE — Telephone Encounter (Signed)
Pt called stating the pharmacy had none of his medications sent over on the 12th. Spoke to pharmacy they had it all except nutritional shakes and lyrica. Lyrica looks to have been printed and given to pt. Called pt and he is aware and will call if he can not find rx or has any more trouble

## 2016-05-24 ENCOUNTER — Other Ambulatory Visit: Payer: Self-pay | Admitting: Medical

## 2016-05-24 ENCOUNTER — Telehealth: Payer: Self-pay | Admitting: Medical

## 2016-05-24 MED ORDER — PEN NEEDLES 32G X 4 MM MISC: 1.0000 | Freq: Every day | Status: DC

## 2016-05-24 MED ORDER — INSULIN GLARGINE 100 UNIT/ML SOLOSTAR PEN: 45.0000 [IU] | PEN_INJECTOR | Freq: Every day | SUBCUTANEOUS | Status: DC

## 2016-05-24 MED ORDER — INSULIN ASPART 100 UNIT/ML FLEXPEN: 15.0000 [IU] | PEN_INJECTOR | Freq: Three times a day (TID) | SUBCUTANEOUS | Status: DC

## 2016-05-24 NOTE — Telephone Encounter (Signed)
pls look into this.  We already referred to endocrinology so this should already be set.  Regarding therapist, just establish care.  i don't have a specific recommendation.  If he has 2-3 offices he has in mind, let me know and I can maybe help him narrow it down

## 2016-05-24 NOTE — Telephone Encounter (Signed)
meds send as flex pens but call in needles for both pens

## 2016-05-24 NOTE — Telephone Encounter (Signed)
Pt called stating that he spoke to Diabetic office and they want to know which diabetic doctor that Brandon [name] wants pt to see also pt wants to know which therapist Brandon [name] would prefer that he makes an appt with so he can go ahead an make that appt as well for mental well being.

## 2016-05-24 NOTE — Telephone Encounter (Signed)
Sent electronically 

## 2016-05-24 NOTE — Telephone Encounter (Signed)
Pt's friend, [name], called stating that pt went to pick up his two insulin scripts and they were vials but pt prefers pens. He would like for his two insulin scripts to be changed to pens.

## 2016-05-25 ENCOUNTER — Telehealth: Payer: Self-pay | Admitting: Medical

## 2016-05-25 NOTE — Telephone Encounter (Signed)
Point him towards [facility] for counseling, or [facility]

## 2016-05-25 NOTE — Telephone Encounter (Signed)
Appt is 06/30/16 with Endocrinology, looking like it is [facility] per pt giving phone number [phone] for where his appt is. Asked for list of therapists again because pt lost it

## 2016-05-25 NOTE — Telephone Encounter (Signed)
Called [facility] [employer] [employer]. Lyrica t# [phone], was approved til 05/20/17 PA# [id number], left message for pt

## 2016-05-25 NOTE — Telephone Encounter (Signed)
Pt is aware of both offices and their phone numbers

## 2016-05-25 NOTE — Telephone Encounter (Signed)
Pt was calling because he wanted to know which Endocrinologist at the practice he should see. Told pt it was up to him and he will call me back with time and date of appt with Endocrinology

## 2016-05-31 ENCOUNTER — Telehealth: Payer: Self-pay | Admitting: Family Medicine

## 2016-05-31 NOTE — Telephone Encounter (Signed)
error 

## 2016-06-02 ENCOUNTER — Telehealth: Payer: Self-pay

## 2016-06-02 NOTE — Telephone Encounter (Signed)
Pt called to let us know his Endocrinology 01/20/17 Dr Buddy Duty and is on waiting list, pain center will call in July to make appt, and [facility] he is calling today to make appt. Has not called a therapist yet, said that [facility] called and asked if he would be ok doing a followup with them and he said okay.

## 2016-06-04 ENCOUNTER — Telehealth: Payer: Self-pay | Admitting: Medical

## 2016-06-06 NOTE — Telephone Encounter (Signed)
Called Medicaid t# [phone] & P.A. Was approved til 06/01/17 PA# [id number], left message for pt

## 2016-06-08 ENCOUNTER — Telehealth: Payer: Self-pay

## 2016-06-08 NOTE — Telephone Encounter (Signed)
Pt stated that he will call them and now has endo appt with [facility] on 06/13/16 @ 1pm

## 2016-06-08 NOTE — Telephone Encounter (Signed)
[facility] [place]? [facility] if not other options.

## 2016-06-08 NOTE — Telephone Encounter (Signed)
Rcieved a fax from [facility] that they have been unable to contact pt, I called pt and he was just confused. Pt is calling back and if they can get him in sooner than Dr.Kerr's office then he will cancel appt with Dr. Buddy Duty. Pt is also going to call [facility] and set up that appt as well.   Brandon [name]: pt stated that he called the phychiatrists you asked him to and none take medicaid. Any other options for him because he wants to stay away from [facility]

## 2016-07-25 ENCOUNTER — Emergency Department (HOSPITAL_COMMUNITY)
Admission: EM | Admit: 2016-07-25 | Discharge: 2016-07-26 | Disposition: A | Discharge status: Home / Self Care | Payer: Medicaid Other | Source: Home / Self Care | Attending: Emergency Medicine | Admitting: Emergency Medicine

## 2016-07-25 ENCOUNTER — Encounter (HOSPITAL_COMMUNITY): Payer: Self-pay | Admitting: Emergency Medicine

## 2016-07-25 ENCOUNTER — Emergency Department (HOSPITAL_COMMUNITY): Payer: Medicaid Other

## 2016-07-25 DIAGNOSIS — Z79899 Other long term (current) drug therapy: Secondary | ICD-10-CM

## 2016-07-25 DIAGNOSIS — Z8673 Personal history of transient ischemic attack (TIA), and cerebral infarction without residual deficits: Secondary | ICD-10-CM | POA: Diagnosis not present

## 2016-07-25 DIAGNOSIS — E11649 Type 2 diabetes mellitus with hypoglycemia without coma: Secondary | ICD-10-CM | POA: Diagnosis not present

## 2016-07-25 DIAGNOSIS — Z8553 Personal history of malignant neoplasm of renal pelvis: Secondary | ICD-10-CM | POA: Diagnosis not present

## 2016-07-25 DIAGNOSIS — Z7982 Long term (current) use of aspirin: Secondary | ICD-10-CM

## 2016-07-25 DIAGNOSIS — I10 Essential (primary) hypertension: Secondary | ICD-10-CM

## 2016-07-25 DIAGNOSIS — Z85828 Personal history of other malignant neoplasm of skin: Secondary | ICD-10-CM

## 2016-07-25 DIAGNOSIS — E11621 Type 2 diabetes mellitus with foot ulcer: Secondary | ICD-10-CM | POA: Diagnosis not present

## 2016-07-25 DIAGNOSIS — N433 Hydrocele, unspecified: Secondary | ICD-10-CM | POA: Insufficient documentation

## 2016-07-25 DIAGNOSIS — E119 Type 2 diabetes mellitus without complications: Secondary | ICD-10-CM

## 2016-07-25 DIAGNOSIS — M7989 Other specified soft tissue disorders: Secondary | ICD-10-CM | POA: Diagnosis not present

## 2016-07-25 DIAGNOSIS — T68XXXA Hypothermia, initial encounter: Secondary | ICD-10-CM | POA: Diagnosis not present

## 2016-07-25 DIAGNOSIS — Z794 Long term (current) use of insulin: Secondary | ICD-10-CM | POA: Insufficient documentation

## 2016-07-25 DIAGNOSIS — L97529 Non-pressure chronic ulcer of other part of left foot with unspecified severity: Secondary | ICD-10-CM | POA: Diagnosis not present

## 2016-07-25 DIAGNOSIS — E162 Hypoglycemia, unspecified: Secondary | ICD-10-CM | POA: Diagnosis present

## 2016-07-25 DIAGNOSIS — X31XXXA Exposure to excessive natural cold, initial encounter: Secondary | ICD-10-CM | POA: Diagnosis not present

## 2016-07-25 DIAGNOSIS — N50812 Left testicular pain: Secondary | ICD-10-CM

## 2016-07-25 LAB — BASIC METABOLIC PANEL
Anion gap: 5 (ref 5–15)
BUN: 31 mg/dL — ABNORMAL HIGH (ref 6–20)
CO2: 26 mmol/L (ref 22–32)
Calcium: 9.4 mg/dL (ref 8.9–10.3)
Chloride: 110 mmol/L (ref 101–111)
Creatinine, Ser: 1.23 mg/dL (ref 0.61–1.24)
GFR calc Af Amer: >60 mL/min (ref >60)
GFR calc non Af Amer: >60 mL/min (ref >60)
Glucose, Bld: 89 mg/dL (ref 65–99)
Potassium: 4.1 mmol/L (ref 3.5–5.1)
Sodium: 141 mmol/L (ref 135–145)

## 2016-07-25 LAB — CBC WITH DIFFERENTIAL/PLATELET
Basophils Absolute: 0 10*3/uL (ref 0.0–0.1)
Basophils Relative: 0 %
Eosinophils Absolute: 0.1 10*3/uL (ref 0.0–0.7)
Eosinophils Relative: 2 %
HCT: 36.2 % — ABNORMAL LOW (ref 39.0–52.0)
Hemoglobin: 12.3 g/dL — ABNORMAL LOW (ref 13.0–17.0)
Lymphocytes Relative: 55 %
Lymphs Abs: 2.3 10*3/uL (ref 0.7–4.0)
MCH: 29.9 pg (ref 26.0–34.0)
MCHC: 34 g/dL (ref 30.0–36.0)
MCV: 88.1 fL (ref 78.0–100.0)
Monocytes Absolute: 0.4 10*3/uL (ref 0.1–1.0)
Monocytes Relative: 10 %
Neutro Abs: 1.4 10*3/uL — ABNORMAL LOW (ref 1.7–7.7)
Neutrophils Relative %: 33 %
Platelets: 223 10*3/uL (ref 150–400)
RBC: 4.11 MIL/uL — ABNORMAL LOW (ref 4.22–5.81)
RDW: 11.7 % (ref 11.5–15.5)
WBC: 4.2 10*3/uL (ref 4.0–10.5)

## 2016-07-25 LAB — TROPONIN I: Troponin I: <0.03 ng/mL (ref <0.03)

## 2016-07-25 LAB — D-DIMER, QUANTITATIVE (NOT AT ARMC): D-Dimer, Quant: 0.32 ug/mL-FEU (ref 0.00–0.50)

## 2016-07-25 MED ORDER — MORPHINE SULFATE (PF) 4 MG/ML IV SOLN
4.0000 mg | Freq: Once | INTRAVENOUS | Status: AC
  Administered 2016-07-25: 4 mg via INTRAVENOUS
  Filled 2016-07-25: qty 1

## 2016-07-25 MED ORDER — OXYCODONE-ACETAMINOPHEN 5-325 MG PO TABS
2.0000 | ORAL_TABLET | Freq: Once | ORAL | Status: AC
  Administered 2016-07-25: 2 via ORAL
  Filled 2016-07-25: qty 2

## 2016-07-25 MED ORDER — ENOXAPARIN SODIUM 120 MG/0.8ML ~~LOC~~ SOLN
120.0000 mg | Freq: Once | SUBCUTANEOUS | Status: AC
  Administered 2016-07-25: 120 mg via SUBCUTANEOUS
  Filled 2016-07-25: qty 0.8

## 2016-07-25 MED ORDER — OXYCODONE-ACETAMINOPHEN 5-325 MG PO TABS: 1.0000 | ORAL_TABLET | Freq: Four times a day (QID) | ORAL | 0 refills | Status: DC | PRN

## 2016-07-25 NOTE — ED Provider Notes (Signed)
2315 - Patient care assumed from Armstead Peaks, PA-C at shift change with scrotal US pending. US findings reviewed with evidence of small b/l hydroceles. Patient advised to use briefs and a jock strap for scrotal elevation. Will refer to Urology. Patient also instructed to return for vascular ultrasound for DVT study. He has been tx with Lovenox. D dimer is reassuring. No indication for further emergent work up. Patient discharged in satisfactory condition with no unaddressed concerns.  Vitals:   07/25/16 2200 07/25/16 2230 07/25/16 2300 07/25/16 2330  BP: 155/76 159/96 150/95 151/97  Pulse: 73 66 (!) 59 65  Resp: 15 18 15 17   Temp:      TempSrc:      SpO2: 100% 100% 100% 99%      Antonietta Breach, PA-C 07/25/16 2356    Courteney Lyn Mackuen, MD 07/26/16 1615

## 2016-07-25 NOTE — ED Provider Notes (Signed)
[facility] DEPT Provider Note   CSN: [id number] Arrival date & time: 07/25/16  1518     History   Chief Complaint Chief Complaint  Patient presents with  . Leg Pain    HPI Brandon [name] is a 55 y.o. male with history of stroke, diabetes, renal cell cancer, schizophrenia who presents with a one-week history of left leg in the left testicle pain. Patient has medial left leg pain, worse around the knee. The pain is worse with movement. His knee gave out on him last night. Patient took a trip to [place] 2 weeks ago and rode in the car 10 hours one way. Patient reports his symptoms began following this trip. Patient reports that he has had increased shortness of breath and wheezing since the trip as well. Patient was scratched by a dog around 1-2 weeks ago to his left shin. The wound has been well healing since. Patient has not taken any medications for his pain. He states that he is not allowed to take any over-the-counter pain medications including ibuprofen or Tylenol because of his history of renal cell cancer. Denies recent trauma. Patient denies any chest pain, abdominal pain, nausea, vomiting, dysuria. Patient and girlfriend report on the hallucinations, however patient denies any SI or HI. Patient has been taking his antipsychotics as prescribed and his primary care provider is aware of this.  HPI  Past Medical History:  Diagnosis Date  . Acute ischemic stroke ([place]) 11/2013  . 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 Depression    BH Adm in [place] Depression  . Diabetes mellitus    DKA prior hospitalization  . Diverticulitis    s/p micorperforation Sept 2012-managed conservatively by Gen surgery  . ED (erectile dysfunction)   . Focal seizure ([place]) 11/2013   due to ischemic stroke  . Hypertension   . Kidney tumor 09/2011   Renal cell CA  . Wears glasses     Patient Active Problem List   Diagnosis Date Noted  . Diabetic ulcer  of left foot associated with secondary diabetes mellitus ([place]) 04/21/2016  . AKI (acute kidney injury) ([place]) 03/03/2016  . Severe episode of recurrent major depressive disorder, without psychotic features ([place])   . History of seizure 01/05/2016  . History of stroke 01/05/2016  . Essential hypertension 01/05/2016  . Type 2 diabetes mellitus with complication, with long-term current use of insulin ([place]) 01/05/2016  . Numbness 03/20/2015  . Other vascular headache 03/20/2015  . 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 Acute ischemic stroke ([place]) 11/16/2013  . Chronic pain syndrome 06/27/2013  . Renal cell carcinoma ([place]) 03/15/2012  . Essential hypertension, benign 03/15/2012  . Uncontrolled diabetes mellitus type 2 without complications ([place]) 81/27/5170  . Renal cell cancer ([place]) 12/30/2011  . Diverticulitis-history of 12/30/2011  . Abdominal pain 12/30/2011    Past Surgical History:  Procedure Laterality Date  . KIDNEY SURGERY     ablation of renal cell CA - 12/28, prior one was October 2012-Dr. Kathlene Cote  . TEE WITHOUT CARDIOVERSION N/A 11/19/2013   Procedure: TRANSESOPHAGEAL ECHOCARDIOGRAM (TEE);  Surgeon: Lelon Perla, MD;  Location: [facility] ENDOSCOPY;  Service: Cardiovascular;  Laterality: N/A;       Home Medications    Prior to Admission medications   Medication Sig Start Date End Date Taking? Authorizing Provider  Amino Acids-Protein Hydrolys (FEEDING SUPPLEMENT, PRO-STAT SUGAR FREE 64,) LIQD Take 30 mLs by  mouth 2 (two) times daily. 04/24/16  Yes Eugenie Filler, MD  aspirin 325 MG tablet Take 325 mg by mouth daily.   Yes Historical Provider, MD  atorvastatin (LIPITOR) 40 MG tablet Take 1 tablet (40 mg total) by mouth daily. 04/24/16  Yes Eugenie Filler, MD  DULoxetine (CYMBALTA) 60 MG capsule Take 1 capsule (60 mg total) by mouth daily. 05/16/16  Yes Camelia Eng Tysinger, PA-C  feeding supplement, GLUCERNA SHAKE, (GLUCERNA SHAKE)  LIQD Take 237 mLs by mouth 3 (three) times daily between meals. 04/24/16  Yes Eugenie Filler, MD  insulin aspart (NOVOLOG FLEXPEN) 100 UNIT/ML FlexPen Inject 15 Units into the skin 3 (three) times daily with meals. Patient taking differently: Inject 15 Units into the skin 3 (three) times daily with meals. Per sliding scale 05/24/16  Yes Camelia Eng Tysinger, PA-C  Insulin Glargine (LANTUS) 100 UNIT/ML Solostar Pen Inject 45 Units into the skin daily at 10 pm. Patient taking differently: Inject 45 Units into the skin 2 (two) times daily.  05/24/16  Yes Camelia Eng Tysinger, PA-C  levETIRAcetam (KEPPRA) 500 MG tablet Take 1 tablet (500 mg total) by mouth 2 (two) times daily. Patient taking differently: Take 500 mg by mouth daily.  04/24/16  Yes Eugenie Filler, MD  lisinopril (PRINIVIL,ZESTRIL) 10 MG tablet Take 1 tablet (10 mg total) by mouth daily. 05/16/16  Yes Camelia Eng Tysinger, PA-C  mupirocin cream (BACTROBAN) 2 % Apply topically daily. 04/24/16  Yes Eugenie Filler, MD  pregabalin (LYRICA) 100 MG capsule Take 1 capsule (100 mg total) by mouth daily. 05/16/16  Yes Camelia Eng Tysinger, PA-C  blood glucose meter kit and supplies Dispense based on patient and insurance preference. Use up to four times daily as directed. (FOR ICD-9 250.00, 250.01). 04/24/16   Eugenie Filler, MD  Insulin Pen Needle (PEN NEEDLES) 32G X 4 MM MISC 1 each by Does not apply route daily. Use for insulin pens 05/24/16   Camelia Eng Tysinger, PA-C  Insulin Syringe-Needle U-100 (INSULIN SYRINGE .3CC/31GX5/16") 31G X 5/16" 0.3 ML MISC 4 Units by Does not apply route 3 (three) times daily. 04/24/16   Eugenie Filler, MD  Nutritional Supplements (GLUCERNA HUNGER SMART SHAKE) LIQD Take 1 Bottle by mouth 2 (two) times daily. As a meal replacement Patient not taking: Reported on 07/25/2016 05/23/16   Camelia Eng Tysinger, PA-C  oxyCODONE-acetaminophen (PERCOCET/ROXICET) 5-325 MG tablet Take 1-2 tablets by mouth every 4 (four) hours as needed for  moderate pain or severe pain. Patient not taking: Reported on 05/09/2016 04/24/16   Eugenie Filler, MD    Family History Family History  Problem Relation Age of Onset  . Diabetes Father     Social History Social History  Substance Use Topics  . Smoking status: Never Smoker  . Smokeless tobacco: Never Used  . Alcohol use No     Allergies   Review of patient's allergies indicates no known allergies.   Review of Systems Review of Systems  Constitutional: Negative for chills and fever.  HENT: Negative for facial swelling and sore throat.   Respiratory: Positive for shortness of breath.   Cardiovascular: Positive for leg swelling. Negative for chest pain.  Gastrointestinal: Negative for abdominal pain, nausea and vomiting.  Genitourinary: Positive for testicular pain (left). Negative for dysuria.  Musculoskeletal: Negative for back pain.  Skin: Negative for rash and wound.  Neurological: Negative for headaches.  Psychiatric/Behavioral: The patient is not nervous/anxious.      Physical Exam Updated  Vital Signs BP (!) 150/107   Pulse 65   Temp 98.7 F (37.1 C) (Oral)   Resp 16   SpO2 100%   Physical Exam  Constitutional: He appears well-developed and well-nourished. No distress.  HENT:  Head: Normocephalic and atraumatic.  Mouth/Throat: Oropharynx is clear and moist. No oropharyngeal exudate.  Eyes: Conjunctivae are normal. Pupils are equal, round, and reactive to light. Right eye exhibits no discharge. Left eye exhibits no discharge. No scleral icterus.  Neck: Normal range of motion. Neck supple. No thyromegaly present.  Cardiovascular: Normal rate, regular rhythm, normal heart sounds and intact distal pulses.  Exam reveals no gallop and no friction rub.   No murmur heard. Pulmonary/Chest: Effort normal and breath sounds normal. No stridor. No respiratory distress. He has no wheezes. He has no rales.  Abdominal: Soft. Bowel sounds are normal. He exhibits no  distension. There is tenderness in the left lower quadrant. There is no rebound and no guarding.    Genitourinary: Cremasteric reflex is present. Right testis shows no mass, no swelling and no tenderness. Left testis shows tenderness. Left testis shows no mass and no swelling.  Musculoskeletal: He exhibits no edema.  Full range of motion left knee joint; diffuse warmth to entire left lower extremity; medial tenderness from groin to knee; left calf tenderness to palpation; 5/5 strength to bilateral lower extremities; DP pulses intact  Lymphadenopathy:    He has no cervical adenopathy.  Neurological: He is alert. Coordination normal.  Skin: Skin is warm and dry. No rash noted. He is not diaphoretic. No pallor.  Well-healing 3 cm laceration to left anterior shin; no drainage, localized warmth, or erythema  Psychiatric: He has a normal mood and affect.  Nursing note and vitals reviewed.    ED Treatments / Results  Labs (all labs ordered are listed, but only abnormal results are displayed) Labs Reviewed  BASIC METABOLIC PANEL - Abnormal; Notable for the following:       Result Value   BUN 31 (*)    All other components within normal limits  CBC WITH DIFFERENTIAL/PLATELET - Abnormal; Notable for the following:    RBC 4.11 (*)    Hemoglobin 12.3 (*)    HCT 36.2 (*)    Neutro Abs 1.4 (*)    All other components within normal limits  D-DIMER, QUANTITATIVE (NOT AT [facility])  TROPONIN I    EKG  EKG Interpretation  Date/Time:  Monday July 25 2016 19:29:14 EDT Ventricular Rate:  63 PR Interval:    QRS Duration: 97 QT Interval:  390 QTC Calculation: 400 R Axis:   26 Text Interpretation:  Sinus rhythm Borderline prolonged PR interval Anterior infarct, old No significant change was found Confirmed by Florina Ou  MD, Jenny Reichmann ([id number]) on 07/25/2016 7:32:18 PM       Radiology Dg Chest 2 View  Result Date: 07/25/2016 CLINICAL DATA:  Shortness of breath, cough with yellow sputum, wheezing for 1  week. Leg swelling. EXAM: CHEST  2 VIEW COMPARISON:  None. FINDINGS: Heart size and mediastinal contours are within normal limits. Lungs are clear. Lung volumes are normal. No evidence of pneumonia. No pleural effusion or pneumothorax seen. Ankylosis noted throughout the thoracic spine. No acute or suspicious osseous finding. IMPRESSION: 1. No evidence of acute cardiopulmonary abnormality. No evidence of pneumonia or pulmonary edema. 2. 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 Electronically Signed   By: Franki Cabot M.D.   On: 07/25/2016 19:31    Procedures Procedures (including critical care  time)  Medications Ordered in ED Medications  enoxaparin (LOVENOX) injection 120 mg (not administered)  morphine 4 MG/ML injection 4 mg (4 mg Intravenous Given 07/25/16 2009)     Initial Impression / Assessment and Plan / ED Course  I have reviewed the triage vital signs and the nursing notes.  Pertinent labs & imaging results that were available during my care of the patient were reviewed by me and considered in my medical decision making (see chart for details).  Clinical Course    CBC shows stable anemia, hemoglobin 12.3. BMP shows BUN 31. D-Dimer 0.32. Troponin <0.03. CXR shows no acute cardio pulmonary abnormality. EKG shows NSR, borderline prolonged PR interval. Ultrasound of scrotum pending. Venous ultrasound unavailable at this time. Will have patient follow up tomorrow morning for venous ultrasound of the left lower extremity. Lovenox 120 mg given in ED. Denies recent trauma indicating imaging. At shift change, patient care transferred to [facility], PA-C for continued evaluation, follow up of  Scrotal US and determination of disposition. Discharge pending following scrotal ultrasound with ambulatory referral to venous ultrasound lower extremity at [facility] tomorrow morning to rule out DVT. Patient has an appointment with his primary care provider at 3 PM tomorrow. Patient  understands and agrees with plan. I discussed patient with Dr. Florina Ou who guided patient's management and agrees with plan.    Final Clinical Impressions(s) / ED Diagnoses   Final diagnoses:  None    New Prescriptions New Prescriptions   No medications on file     Frederica Kuster, Hershal Coria 07/25/16 2117    Frederica Kuster, PA-C 07/25/16 2133    Shanon Rosser, MD 07/25/16 2330

## 2016-07-25 NOTE — ED Triage Notes (Addendum)
Patient presents for left leg and testicle pain x1 week. Reports dog jumped on leg, small wound noted to left shin, no signs of infection on wound, denies urinary symptoms. Knee is reddened and swollen.

## 2016-07-25 NOTE — Discharge Instructions (Signed)
Return at [facility] for an ultrasound of your leg. Follow up with your primary care doctor. You may see a urologist for evaluation of your hydroceles. You would likely benefit from using a jock strap and briefs instead of boxers for scrotal elevation.

## 2016-07-26 ENCOUNTER — Emergency Department (HOSPITAL_COMMUNITY)
Admission: EM | Admit: 2016-07-26 | Discharge: 2016-07-26 | Disposition: A | Discharge status: Home / Self Care | Payer: Medicaid Other | Attending: Emergency Medicine | Admitting: Emergency Medicine

## 2016-07-26 ENCOUNTER — Emergency Department (HOSPITAL_BASED_OUTPATIENT_CLINIC_OR_DEPARTMENT_OTHER): Payer: Medicaid Other

## 2016-07-26 ENCOUNTER — Ambulatory Visit: Payer: Medicaid Other | Admitting: Family Medicine

## 2016-07-26 ENCOUNTER — Encounter (HOSPITAL_COMMUNITY): Payer: Self-pay | Admitting: Emergency Medicine

## 2016-07-26 ENCOUNTER — Ambulatory Visit (HOSPITAL_COMMUNITY): Admission: RE | Admit: 2016-07-26 | Payer: Medicaid Other | Source: Ambulatory Visit

## 2016-07-26 DIAGNOSIS — Z8553 Personal history of malignant neoplasm of renal pelvis: Secondary | ICD-10-CM | POA: Insufficient documentation

## 2016-07-26 DIAGNOSIS — E162 Hypoglycemia, unspecified: Secondary | ICD-10-CM

## 2016-07-26 DIAGNOSIS — I10 Essential (primary) hypertension: Secondary | ICD-10-CM | POA: Insufficient documentation

## 2016-07-26 DIAGNOSIS — M79609 Pain in unspecified limb: Secondary | ICD-10-CM

## 2016-07-26 DIAGNOSIS — M7989 Other specified soft tissue disorders: Secondary | ICD-10-CM

## 2016-07-26 DIAGNOSIS — Z794 Long term (current) use of insulin: Secondary | ICD-10-CM | POA: Insufficient documentation

## 2016-07-26 DIAGNOSIS — L97529 Non-pressure chronic ulcer of other part of left foot with unspecified severity: Secondary | ICD-10-CM | POA: Insufficient documentation

## 2016-07-26 DIAGNOSIS — Z8673 Personal history of transient ischemic attack (TIA), and cerebral infarction without residual deficits: Secondary | ICD-10-CM | POA: Insufficient documentation

## 2016-07-26 DIAGNOSIS — X31XXXA Exposure to excessive natural cold, initial encounter: Secondary | ICD-10-CM | POA: Insufficient documentation

## 2016-07-26 DIAGNOSIS — Z7982 Long term (current) use of aspirin: Secondary | ICD-10-CM | POA: Insufficient documentation

## 2016-07-26 DIAGNOSIS — E11649 Type 2 diabetes mellitus with hypoglycemia without coma: Secondary | ICD-10-CM | POA: Insufficient documentation

## 2016-07-26 DIAGNOSIS — T68XXXA Hypothermia, initial encounter: Secondary | ICD-10-CM

## 2016-07-26 DIAGNOSIS — E11621 Type 2 diabetes mellitus with foot ulcer: Secondary | ICD-10-CM | POA: Insufficient documentation

## 2016-07-26 DIAGNOSIS — M79605 Pain in left leg: Secondary | ICD-10-CM

## 2016-07-26 LAB — CBC WITH DIFFERENTIAL/PLATELET
BASOS ABS: 0 10*3/uL (ref 0.0–0.1)
BASOS PCT: 1 %
Eosinophils Absolute: 0 10*3/uL (ref 0.0–0.7)
Eosinophils Relative: 1 %
HEMATOCRIT: 39.5 % (ref 39.0–52.0)
HEMOGLOBIN: 13.1 g/dL (ref 13.0–17.0)
LYMPHS PCT: 23 %
Lymphs Abs: 0.8 10*3/uL (ref 0.7–4.0)
MCH: 30.1 pg (ref 26.0–34.0)
MCHC: 33.2 g/dL (ref 30.0–36.0)
MCV: 90.8 fL (ref 78.0–100.0)
MONO ABS: 0.2 10*3/uL (ref 0.1–1.0)
MONOS PCT: 6 %
NEUTROS ABS: 2.5 10*3/uL (ref 1.7–7.7)
NEUTROS PCT: 69 %
Platelets: 201 10*3/uL (ref 150–400)
RBC: 4.35 MIL/uL (ref 4.22–5.81)
RDW: 11.6 % (ref 11.5–15.5)
WBC: 3.6 10*3/uL — ABNORMAL LOW (ref 4.0–10.5)

## 2016-07-26 LAB — BASIC METABOLIC PANEL
ANION GAP: 7 (ref 5–15)
BUN: 27 mg/dL — ABNORMAL HIGH (ref 6–20)
CO2: 24 mmol/L (ref 22–32)
Calcium: 9.3 mg/dL (ref 8.9–10.3)
Chloride: 109 mmol/L (ref 101–111)
Creatinine, Ser: 1.28 mg/dL — ABNORMAL HIGH (ref 0.61–1.24)
GLUCOSE: 102 mg/dL — AB (ref 65–99)
POTASSIUM: 3.4 mmol/L — AB (ref 3.5–5.1)
Sodium: 140 mmol/L (ref 135–145)

## 2016-07-26 LAB — CBG MONITORING, ED
GLUCOSE-CAPILLARY: 188 mg/dL — AB (ref 65–99)
GLUCOSE-CAPILLARY: 147 mg/dL — AB (ref 65–99)
Glucose-Capillary: 58 mg/dL — ABNORMAL LOW (ref 65–99)
Glucose-Capillary: 49 mg/dL — ABNORMAL LOW (ref 65–99)

## 2016-07-26 MED ORDER — GLUCAGON HCL RDNA (DIAGNOSTIC) 1 MG IJ SOLR
INTRAMUSCULAR | Status: AC
  Administered 2016-07-26: 09:00:00
  Filled 2016-07-26: qty 1

## 2016-07-26 MED ORDER — DEXTROSE 50 % IV SOLN
INTRAVENOUS | Status: AC
  Administered 2016-07-26: 09:00:00
  Filled 2016-07-26: qty 50

## 2016-07-26 MED ORDER — DEXTROSE 50 % IV SOLN: 1.0000 | Freq: Once | INTRAVENOUS | Status: DC

## 2016-07-26 MED ORDER — HYDROCODONE-ACETAMINOPHEN 5-325 MG PO TABS
1.0000 | ORAL_TABLET | Freq: Once | ORAL | Status: AC
  Administered 2016-07-26: 1 via ORAL
  Filled 2016-07-26: qty 1

## 2016-07-26 MED ORDER — HYDROCODONE-ACETAMINOPHEN 5-325 MG PO TABS: 1.0000 | ORAL_TABLET | ORAL | 0 refills | Status: DC | PRN

## 2016-07-26 NOTE — ED Notes (Signed)
Dr . Johnney Killian aware

## 2016-07-26 NOTE — ED Notes (Signed)
Lunch tray ordered 

## 2016-07-26 NOTE — ED Notes (Signed)
Spoke with Sharyn Lull at [facility], to have his doppler done at the bedside.  They are able to do bedside.

## 2016-07-26 NOTE — ED Notes (Signed)
Patient told wife he took his insulin last pm before going to bed however wasn't able to eat this am. Presently patient is alert wife at bedside.

## 2016-07-26 NOTE — ED Triage Notes (Signed)
Pt in from home via [facility] after CBG of 12 and unresponsiveness. Per EMS, pt stated he was scheduled for a procedure to r/o blood clots today, did not eat p MN and woke up lethargic. When EMS arrived, pt was unresponsive, foaming at mouth. Was given 1mg  Glucagon IM. CBG on arrival 17. Alert, anxious and tearful. Pt has hx of DM, CVA in 19' with L sided deficits.

## 2016-07-26 NOTE — ED Notes (Signed)
Pt. Getting oob and getting dressed , gait steady

## 2016-07-26 NOTE — ED Provider Notes (Addendum)
[facility] DEPT Provider Note   CSN: [id number] Arrival date & time: 07/26/16  8315     History   Chief Complaint Chief Complaint  Patient presents with  . Hypoglycemia  . Loss of Consciousness    HPI Brandon [name] is a 55 y.o. male.  HPI A she was found unresponsive in the morning. He had gone to bed and his wife went to bed earlier than he did. He was clammy and diaphoretic and she was unable to get a reading on the glucometer. She believes that he took his insulin in the evening even though he was told not to eat anything after after his discharge from [place] yesterday evening. He was scheduled to get lower extremity ultrasounds this morning at [facility]. Patient had been having problems with pain and swelling of the left lower extremity. Past Medical History:  Diagnosis Date  . Acute ischemic stroke ([place]) 11/2013  . 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 Depression    BH Adm in [place] Depression  . Diabetes mellitus    DKA prior hospitalization  . Diverticulitis    s/p micorperforation Sept 2012-managed conservatively by Gen surgery  . ED (erectile dysfunction)   . Focal seizure ([place]) 11/2013   due to ischemic stroke  . Hypertension   . Kidney tumor 09/2011   Renal cell CA  . Wears glasses     Patient Active Problem List   Diagnosis Date Noted  . Diabetic ulcer of left foot associated with secondary diabetes mellitus ([place]) 04/21/2016  . AKI (acute kidney injury) ([place]) 03/03/2016  . Severe episode of recurrent major depressive disorder, without psychotic features ([place])   . History of seizure 01/05/2016  . History of stroke 01/05/2016  . Essential hypertension 01/05/2016  . Type 2 diabetes mellitus with complication, with long-term current use of insulin ([place]) 01/05/2016  . Numbness 03/20/2015  . Other vascular headache 03/20/2015  . 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  Acute ischemic stroke ([place]) 11/16/2013  . Chronic pain syndrome 06/27/2013  . Renal cell carcinoma ([place]) 03/15/2012  . Essential hypertension, benign 03/15/2012  . Uncontrolled diabetes mellitus type 2 without complications ([place]) 17/61/6073  . Renal cell cancer ([place]) 12/30/2011  . Diverticulitis-history of 12/30/2011  . Abdominal pain 12/30/2011    Past Surgical History:  Procedure Laterality Date  . KIDNEY SURGERY     ablation of renal cell CA - 12/28, prior one was October 2012-Dr. Kathlene Cote  . TEE WITHOUT CARDIOVERSION N/A 11/19/2013   Procedure: TRANSESOPHAGEAL ECHOCARDIOGRAM (TEE);  Surgeon: Lelon Perla, MD;  Location: [facility] ENDOSCOPY;  Service: Cardiovascular;  Laterality: N/A;       Home Medications    Prior to Admission medications   Medication Sig Start Date End Date Taking? Authorizing Provider  Amino Acids-Protein Hydrolys (FEEDING SUPPLEMENT, PRO-STAT SUGAR FREE 64,) LIQD Take 30 mLs by mouth 2 (two) times daily. 04/24/16   Eugenie Filler, MD  aspirin 325 MG tablet Take 325 mg by mouth daily.    Historical Provider, MD  atorvastatin (LIPITOR) 40 MG tablet Take 1 tablet (40 mg total) by mouth daily. 04/24/16   Eugenie Filler, MD  blood glucose meter kit and supplies Dispense based on patient and insurance preference. Use up to four times daily as directed. (FOR ICD-9 250.00, 250.01). 04/24/16   Eugenie Filler, MD  DULoxetine (CYMBALTA) 60 MG capsule Take 1 capsule (60  mg total) by mouth daily. 05/16/16   David S Tysinger, PA-C  feeding supplement, GLUCERNA SHAKE, (GLUCERNA SHAKE) LIQD Take 237 mLs by mouth 3 (three) times daily between meals. 04/24/16   Daniel V Thompson, MD  HYDROcodone-acetaminophen (NORCO/VICODIN) 5-325 MG tablet Take 1-2 tablets by mouth every 4 (four) hours as needed for moderate pain or severe pain. 07/26/16    , MD  insulin aspart (NOVOLOG FLEXPEN) 100 UNIT/ML FlexPen Inject 15 Units into the skin 3 (three) times daily with  meals. Patient taking differently: Inject 15 Units into the skin 3 (three) times daily with meals. Per sliding scale 05/24/16   David S Tysinger, PA-C  Insulin Glargine (LANTUS) 100 UNIT/ML Solostar Pen Inject 45 Units into the skin daily at 10 pm. Patient taking differently: Inject 45 Units into the skin 2 (two) times daily.  05/24/16   David S Tysinger, PA-C  Insulin Pen Needle (PEN NEEDLES) 32G X 4 MM MISC 1 each by Does not apply route daily. Use for insulin pens 05/24/16   David S Tysinger, PA-C  Insulin Syringe-Needle U-100 (INSULIN SYRINGE .3CC/31GX5/16") 31G X 5/16" 0.3 ML MISC 4 Units by Does not apply route 3 (three) times daily. 04/24/16   Daniel V Thompson, MD  levETIRAcetam (KEPPRA) 500 MG tablet Take 1 tablet (500 mg total) by mouth 2 (two) times daily. Patient taking differently: Take 500 mg by mouth daily.  04/24/16   Daniel V Thompson, MD  lisinopril (PRINIVIL,ZESTRIL) 10 MG tablet Take 1 tablet (10 mg total) by mouth daily. 05/16/16   David S Tysinger, PA-C  mupirocin cream (BACTROBAN) 2 % Apply topically daily. 04/24/16   Daniel V Thompson, MD  Nutritional Supplements (GLUCERNA HUNGER SMART SHAKE) LIQD Take 1 Bottle by mouth 2 (two) times daily. As a meal replacement Patient not taking: Reported on 07/25/2016 05/23/16   David S Tysinger, PA-C  oxyCODONE-acetaminophen (PERCOCET/ROXICET) 5-325 MG tablet Take 1-2 tablets by mouth every 6 (six) hours as needed for severe pain. 07/25/16   Kelly Humes, PA-C  pregabalin (LYRICA) 100 MG capsule Take 1 capsule (100 mg total) by mouth daily. 05/16/16   David S Tysinger, PA-C    Family History Family History  Problem Relation Age of Onset  . Diabetes Father     Social History Social History  Substance Use Topics  . Smoking status: Never Smoker  . Smokeless tobacco: Never Used  . Alcohol use No     Allergies   Review of patient's allergies indicates no known allergies.   Review of Systems Review of Systems 10 Systems reviewed and are  negative for acute change except as noted in the HPI.   Physical Exam Updated Vital Signs BP 145/92   Pulse 79   Temp 98.7 F (37.1 C) (Oral)   Resp 22   Ht 6' (1.829 m)   Wt 270 lb (122.5 kg) Comment: was just weighted at MD office yest.   SpO2 100%   BMI 36.62 kg/m   Physical Exam  Constitutional:  On arrival the patient is clammy and diaphoretic. Mental status is slightly confused but responding with simple responses.  HENT:  Head: Normocephalic.  Mouth/Throat: Oropharynx is clear and moist.  Eyes: EOM are normal. Pupils are equal, round, and reactive to light.  Cardiovascular: Normal rate, regular rhythm, normal heart sounds and intact distal pulses.   Pulmonary/Chest: Effort normal and breath sounds normal.  Abdominal: Soft. He exhibits no distension. There is no tenderness.  Musculoskeletal:  Left lower extremity has 2+ edema   around the ankle and lower leg. No erythema. Right lower extremity has 1+ edema. No erythema.  Neurological:  On arrival the patient is confused and somnolent. Mental status improved to be awake and answering questions. Patient has a baseline speech impediment. Content however was appropriate. He can follow commands to perform grip strength bilaterally and lower extremity motions.  Skin:  Skin is cool and diaphoretic  Psychiatric:  Patient is anxious.     ED Treatments / Results  Labs (all labs ordered are listed, but only abnormal results are displayed) Labs Reviewed  BASIC METABOLIC PANEL - Abnormal; Notable for the following:       Result Value   Potassium 3.4 (*)    Glucose, Bld 102 (*)    BUN 27 (*)    Creatinine, Ser 1.28 (*)    All other components within normal limits  CBC WITH DIFFERENTIAL/PLATELET - Abnormal; Notable for the following:    WBC 3.6 (*)    All other components within normal limits  CBG MONITORING, ED - Abnormal; Notable for the following:    Glucose-Capillary 58 (*)    All other components within normal limits    CBG MONITORING, ED - Abnormal; Notable for the following:    Glucose-Capillary 49 (*)    All other components within normal limits  CBG MONITORING, ED - Abnormal; Notable for the following:    Glucose-Capillary 188 (*)    All other components within normal limits  CBG MONITORING, ED - Abnormal; Notable for the following:    Glucose-Capillary 147 (*)    All other components within normal limits    EKG  EKG Interpretation None       Radiology Dg Chest 2 View  Result Date: 07/25/2016 CLINICAL DATA:  Shortness of breath, cough with yellow sputum, wheezing for 1 week. Leg swelling. EXAM: CHEST  2 VIEW COMPARISON:  None. FINDINGS: Heart size and mediastinal contours are within normal limits. Lungs are clear. Lung volumes are normal. No evidence of pneumonia. No pleural effusion or pneumothorax seen. Ankylosis noted throughout the thoracic spine. No acute or suspicious osseous finding. IMPRESSION: 1. No evidence of acute cardiopulmonary abnormality. No evidence of pneumonia or pulmonary edema. 2. 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 Electronically Signed   By: Stan  Maynard M.D.   On: 07/25/2016 19:31   Us Scrotum  Result Date: 07/25/2016 CLINICAL DATA:  Left testicular/scrotal pain.  Symptoms for 1 week. EXAM: SCROTAL ULTRASOUND DOPPLER ULTRASOUND OF THE TESTICLES TECHNIQUE: Complete ultrasound examination of the testicles, epididymis, and other scrotal structures was performed. Color and spectral Doppler ultrasound were also utilized to evaluate blood flow to the testicles. COMPARISON:  None. FINDINGS: Right testicle Measurements: 4.5 x 2.7 x 3.0 cm. No mass or microlithiasis visualized. Normal blood flow. Left testicle Measurements: 4.4 x 2.0 x 2.8 cm. No mass or microlithiasis visualized. Normal blood flow. Right epididymis:  Normal in size and appearance. Left epididymis:  Normal in size and appearance. Hydrocele:  Small bilateral. Varicocele:  None visualized. Pulsed  Doppler interrogation of both testes demonstrates normal low resistance arterial and venous waveforms bilaterally. IMPRESSION: Small bilateral hydroceles. Otherwise normal sonographic appearance of the scrotum and testes. Electronically Signed   By: Melanie  Ehinger M.D.   On: 07/25/2016 22:58   Us Art/ven Flow Abd Pelv Doppler  Result Date: 07/25/2016 CLINICAL DATA:  Left testicular/scrotal pain.  Symptoms for 1 week. EXAM: SCROTAL ULTRASOUND DOPPLER ULTRASOUND OF THE TESTICLES TECHNIQUE: Complete ultrasound examination of the testicles, epididymis, and   other scrotal structures was performed. Color and spectral Doppler ultrasound were also utilized to evaluate blood flow to the testicles. COMPARISON:  None. FINDINGS: Right testicle Measurements: 4.5 x 2.7 x 3.0 cm. No mass or microlithiasis visualized. Normal blood flow. Left testicle Measurements: 4.4 x 2.0 x 2.8 cm. No mass or microlithiasis visualized. Normal blood flow. Right epididymis:  Normal in size and appearance. Left epididymis:  Normal in size and appearance. Hydrocele:  Small bilateral. Varicocele:  None visualized. Pulsed Doppler interrogation of both testes demonstrates normal low resistance arterial and venous waveforms bilaterally. IMPRESSION: Small bilateral hydroceles. Otherwise normal sonographic appearance of the scrotum and testes. Electronically Signed   By: Melanie  Ehinger M.D.   On: 07/25/2016 22:58    Procedures Procedures (including critical care time)  CRITICAL CARE Performed by: ,    Total critical care time: 45 minutes  Critical care time was exclusive of separately billable procedures and treating other patients.  Critical care was necessary to treat or prevent imminent or life-threatening deterioration.  Critical care was time spent personally by me on the following activities: development of treatment plan with patient and/or surrogate as well as nursing, discussions with consultants, evaluation of  patient's response to treatment, examination of patient, obtaining history from patient or surrogate, ordering and performing treatments and interventions, ordering and review of laboratory studies, ordering and review of radiographic studies, pulse oximetry and re-evaluation of patient's condition. Angiocath insertion Performed by: ,   Consent: Verbal consent obtained. Risks and benefits: risks, benefits and alternatives were discussed Time out: Immediately prior to procedure a "time out" was called to verify the correct patient, procedure, equipment, support staff and site/side marked as required.  Preparation: Patient was prepped and draped in the usual sterile fashion.  Vein Location: Left antecubital  Ultrasound Guided  Gauge: 22  Normal blood return and flush without difficulty Patient tolerance: Patient tolerated the procedure well with no immediate complications.   Medications Ordered in ED Medications  dextrose 50 % solution 50 mL (not administered)  HYDROcodone-acetaminophen (NORCO/VICODIN) 5-325 MG per tablet 1 tablet (not administered)  glucagon (human recombinant) (GLUCAGEN) 1 MG injection (  Given 07/26/16 0846)  dextrose 50 % solution (  Given 07/26/16 0859)     Initial Impression / Assessment and Plan / ED Course  I have reviewed the triage vital signs and the nursing notes.  Pertinent labs & imaging results that were available during my care of the patient were reviewed by me and considered in my medical decision making (see chart for details).  Clinical Course     Final Clinical Impressions(s) / ED Diagnoses   Final diagnoses:  Hypoglycemia  Hypothermia, initial encounter  Pain and swelling of lower extremity, left  Patient was profoundly hypoglycemic. Glucagon was administered on route by EMS due to inability to establish peripheral IV. By the time he arrives emergency department he was becoming more arousable. A however remained in the 50s.  Ultrasound-guided peripheral IV established and D50 administered. Patient was also very hypo-thermic. Over the course of observation and treatment in the emergency department symptoms have resolved. Are no signs of sepsis or other complications. This appears due to the patient taking insulin yesterday without eating. Ultrasounds were performed as ordered from yesterday and are negative for DVT. Patient is given Vicodin for lower extremity pain. There is no sign of cellulitis. He is to follow-up with his family physician for ongoing evaluation.  New Prescriptions New Prescriptions   HYDROCODONE-ACETAMINOPHEN (NORCO/VICODIN) 5-325 MG TABLET      Take 1-2 tablets by mouth every 4 (four) hours as needed for moderate pain or severe pain.     Charlesetta Shanks, MD 07/26/16 1644    Charlesetta Shanks, MD 07/26/16 [phone]

## 2016-07-26 NOTE — Progress Notes (Signed)
RT responded to ED Trauma C for possible unresponsive pt. Pt in from EMS on non-rebreather with spo2 100%. Pt in no distress, no increased WOB, RR within normal limit. Pt taken off NRB per ED MD and placed on 5L [place]. RT will wean as tolerated and continue to monitor.

## 2016-07-26 NOTE — ED Notes (Signed)
Reported to Dr. Johnney Killian that pt. Is having leg pain after having his venous doppler done

## 2016-07-26 NOTE — ED Notes (Signed)
Pt. Has the warming blanket on.

## 2016-07-26 NOTE — ED Notes (Signed)
Report given to [facility], patient remains alert oriented  Wife at bedside . Bear hugger remains on patient.

## 2016-07-26 NOTE — Progress Notes (Signed)
Preliminary results by tech - Venous Duplex Lower Ext. Completed. Negative for deep and superficial vein thrombosis in both legs.  Aqua Denslow, BS, RDMS, RVT  

## 2016-07-26 NOTE — ED Notes (Signed)
Pt. And his fiancee is sleeping at the bedside.

## 2016-07-26 NOTE — ED Notes (Signed)
Vascular lab called aware patient isn't stable enough to go to vascular lab now.

## 2016-07-26 NOTE — ED Notes (Signed)
Patient remains  Alert oriented wife at bedside. Feeding patient breakfast. Bld sugar. [place]

## 2016-07-26 NOTE — Progress Notes (Signed)
Pt remains on room air in no distress. Rt continue to monitor.

## 2016-08-01 ENCOUNTER — Encounter: Payer: Self-pay | Admitting: Medical

## 2016-08-01 ENCOUNTER — Ambulatory Visit (INDEPENDENT_AMBULATORY_CARE_PROVIDER_SITE_OTHER): Payer: Medicaid Other | Admitting: Medical

## 2016-08-01 VITALS — BP 124/84 | HR 68 | Wt 272.0 lb

## 2016-08-01 DIAGNOSIS — E162 Hypoglycemia, unspecified: Secondary | ICD-10-CM | POA: Diagnosis not present

## 2016-08-01 DIAGNOSIS — Z8673 Personal history of transient ischemic attack (TIA), and cerebral infarction without residual deficits: Secondary | ICD-10-CM

## 2016-08-01 DIAGNOSIS — S8992XD Unspecified injury of left lower leg, subsequent encounter: Secondary | ICD-10-CM

## 2016-08-01 DIAGNOSIS — M6289 Other specified disorders of muscle: Secondary | ICD-10-CM | POA: Diagnosis not present

## 2016-08-01 DIAGNOSIS — R531 Weakness: Secondary | ICD-10-CM

## 2016-08-01 DIAGNOSIS — M25562 Pain in left knee: Secondary | ICD-10-CM

## 2016-08-01 NOTE — Progress Notes (Signed)
Subjective: Chief Complaint  Patient presents with  . Leg Pain    left leg pain, started a few weeks ago and is getting worse. hard to walk and to move around. can not extend his leg. was walking down stairs and his knee "gave" and he fell down 4 steps.    Here for left leg pain.  Started a few days ago, hard to walk on the leg, knee gives way, can't extend the leg.   [place] coming down stairs about 2 weeks ago at home.  Fell down exterior steps, concrete.  [place] off 2nd step after knee gave way.  Ended up falling leftward onto buttocks.  Felt like knee bent backwards.   Used rail to pull himself up.  had pain and swelling that began a day or so later.   Saw endocrinology a week ago, and they sent him to the ED due to knee swelling and leg swelling, worried about DVT.    Currently has pain in left knee medially. Can't extend, has difficulty getting out of chair.  Knee still swells, still lots of pain.     He recently was taken by EMS to the ED with blood sugar under 20.  His spouse/partner thinks he took insulin that evening without eating.   He was treated by EMS and in the ED and subsequently released.   He is seeing endocrinology now, has f/u soon.  He denies low readings since the recent ED visit, but has had 2 low readings in last week or 2 since cutting out soda and sweets.   Past Medical History:  Diagnosis Date  . Acute ischemic stroke ([place]) 11/2013  . 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 Depression    BH Adm in [place] Depression  . Diabetes mellitus    DKA prior hospitalization  . Diverticulitis    s/p micorperforation Sept 2012-managed conservatively by Gen surgery  . ED (erectile dysfunction)   . Focal seizure ([place]) 11/2013   due to ischemic stroke  . Hypertension   . Kidney tumor 09/2011   Renal cell CA  . Wears glasses    ROS as in subjective   Objective: BP 124/84   Pulse 68   Wt 272 lb (123.4 kg)   BMI 36.89 kg/m   Gen: wd,  wn, nad No erythema or bruising No obvious knee swelling Tender medial left knee joint line, tender along left vastus medialis, there seems to be some mild laxity of left knee with anterior drawer, can't fully extend left knee.   Exam is somewhat limited due to pain.  Otherwise non tender.  bilat external hip ROM reduced.  Ankles non tender Pulses 2+ LE Left knee and leg strength reduced compared to right but still 4-5 /5.     Assessment: Encounter Diagnoses  Name Primary?  . Knee pain, acute, left Yes  . Knee injury, left, subsequent encounter   . Left-sided weakness   . History of stroke   . Hypoglycemia      Plan: Possibly has strain vs tear, but no obvious swelling today.  Begin ASO brace (script given), ice, rest, elevation, support with pillow during sleep.  F/u in 7-10 days.   If not improving, will refer to ortho if worse exam suggesting tear.    In general has left sided weakness, s/p stroke, chronic weakness.  Consider PT  Discuss his recent hypoglycemia, advised risks of hypoglycemia, avoidance of hypoglycemia. advised careful  monitoring and let endocrinology or [place] know in the next few days if running under 80 glucose.  Advised he not use his normal dose of insulin if fasting.   He has f/u planned with endocrinology.

## 2016-08-15 ENCOUNTER — Encounter: Payer: Self-pay | Admitting: Medical

## 2016-08-15 ENCOUNTER — Ambulatory Visit (INDEPENDENT_AMBULATORY_CARE_PROVIDER_SITE_OTHER): Payer: Medicaid Other | Admitting: Medical

## 2016-08-15 VITALS — BP 140/80 | HR 98 | Resp 16 | Ht 72.0 in | Wt 270.0 lb

## 2016-08-15 DIAGNOSIS — E118 Type 2 diabetes mellitus with unspecified complications: Secondary | ICD-10-CM | POA: Diagnosis not present

## 2016-08-15 DIAGNOSIS — Z8673 Personal history of transient ischemic attack (TIA), and cerebral infarction without residual deficits: Secondary | ICD-10-CM | POA: Diagnosis not present

## 2016-08-15 DIAGNOSIS — G894 Chronic pain syndrome: Secondary | ICD-10-CM | POA: Diagnosis not present

## 2016-08-15 DIAGNOSIS — M2392 Unspecified internal derangement of left knee: Secondary | ICD-10-CM | POA: Diagnosis not present

## 2016-08-15 DIAGNOSIS — M25562 Pain in left knee: Secondary | ICD-10-CM | POA: Diagnosis not present

## 2016-08-15 DIAGNOSIS — M6289 Other specified disorders of muscle: Secondary | ICD-10-CM

## 2016-08-15 DIAGNOSIS — Z23 Encounter for immunization: Secondary | ICD-10-CM

## 2016-08-15 DIAGNOSIS — Z1211 Encounter for screening for malignant neoplasm of colon: Secondary | ICD-10-CM | POA: Diagnosis not present

## 2016-08-15 DIAGNOSIS — W19XXXD Unspecified fall, subsequent encounter: Secondary | ICD-10-CM | POA: Diagnosis not present

## 2016-08-15 DIAGNOSIS — M238X2 Other internal derangements of left knee: Secondary | ICD-10-CM

## 2016-08-15 DIAGNOSIS — Z794 Long term (current) use of insulin: Secondary | ICD-10-CM | POA: Diagnosis not present

## 2016-08-15 DIAGNOSIS — Z7189 Other specified counseling: Secondary | ICD-10-CM | POA: Diagnosis not present

## 2016-08-15 DIAGNOSIS — R531 Weakness: Secondary | ICD-10-CM

## 2016-08-15 DIAGNOSIS — Z7185 Encounter for immunization safety counseling: Secondary | ICD-10-CM

## 2016-08-15 MED ORDER — PREGABALIN 75 MG PO CAPS: 75.0000 mg | ORAL_CAPSULE | Freq: Two times a day (BID) | ORAL | 0 refills | Status: DC

## 2016-08-15 MED ORDER — DULOXETINE HCL 60 MG PO CPEP: 60.0000 mg | ORAL_CAPSULE | Freq: Two times a day (BID) | ORAL | 2 refills | Status: DC

## 2016-08-15 MED ORDER — HYDROCODONE-ACETAMINOPHEN 5-325 MG PO TABS: 1.0000 | ORAL_TABLET | ORAL | 0 refills | Status: DC | PRN

## 2016-08-15 NOTE — Patient Instructions (Signed)

## 2016-08-15 NOTE — Progress Notes (Signed)
Subjective: Chief Complaint  Patient presents with  . Follow-up    fell 3 weeks ago- and seen in E.D. for swelling and pain. Discuss decrease keppra- 500mg  gives him a headache. refill vicodin. refill lyrica   Here for f/u.  At last visit 08/01/16 he had injured his left knee.  Since last visit he didn't get ASO brace as he couldn't find his insurance card.  So he hasn't used ASO brace, is using some ice though.  Still having a lot of pain in left knee.  Since last visit he fell again.  Since last visit he couldn't get his left foot up to step and fall.  Uses cane.  Has both single prong and 4 prong cane that he uses, but sometimes forgets to use the cane.    Sees endocrinology again in the next month.  Since last visit hasn't seen any more low readings.   Getting 119-133 sugars in the morning.     History per last visit: Here for left leg pain.  Started a few days ago, hard to walk on the leg, knee gives way, can't extend the leg.   [place] coming down stairs about 2 weeks ago at home.  Fell down exterior steps, concrete.  [place] off 2nd step after knee gave way.  Ended up falling leftward onto buttocks.  Felt like knee bent backwards.   Used rail to pull himself up.  had pain and swelling that began a day or so later.   Saw endocrinology a week ago, and they sent him to the ED due to knee swelling and leg swelling, worried about DVT.    Currently has pain in left knee medially. Can't extend, has difficulty getting out of chair.  Knee still swells, still lots of pain.     He recently was taken by EMS to the ED with blood sugar under 20.  His spouse/partner thinks he took insulin that evening without eating.   He was treated by EMS and in the ED and subsequently released.   He is seeing endocrinology now, has f/u soon.  He denies low readings since the recent ED visit, but has had 2 low readings in last week or 2 since cutting out soda and sweets.   Past Medical History:  Diagnosis Date  . Acute  ischemic stroke ([place]) 11/2013  . 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 Depression    BH Adm in [place] Depression  . Diabetes mellitus    DKA prior hospitalization  . Diverticulitis    s/p micorperforation Sept 2012-managed conservatively by Gen surgery  . ED (erectile dysfunction)   . Focal seizure ([place]) 11/2013   due to ischemic stroke  . Hypertension   . Kidney tumor 09/2011   Renal cell CA  . Wears glasses    Past Surgical History:  Procedure Laterality Date  . KIDNEY SURGERY     ablation of renal cell CA - 12/28, prior one was October 2012-Dr. Kathlene Cote  . TEE WITHOUT CARDIOVERSION N/A 11/19/2013   Procedure: TRANSESOPHAGEAL ECHOCARDIOGRAM (TEE);  Surgeon: Lelon Perla, MD;  Location: [facility] ENDOSCOPY;  Service: Cardiovascular;  Laterality: N/A;   ROS as in subjective   Objective: BP 140/80   Pulse 98   Resp 16   Ht 6' (1.829 m)   Wt 270 lb (122.5 kg)   SpO2 97%   BMI 36.62 kg/m   Gen: wd, wn, nad No erythema or bruising No  obvious knee swelling Tender medial left knee joint line, tender along left vastus medialis, there seems to be some mild laxity of left knee with anterior drawer, can't fully extend left knee.   Exam is somewhat limited due to pain.  Otherwise non tender.  bilat external hip ROM reduced.  Ankles non tender Pulses 2+ LE Left knee and leg strength reduced compared to right but still 4-5 /5.    Assessment: Encounter Diagnoses  Name Primary?  . Left knee pain Yes  . Knee joint laxity, left   . Fall, subsequent encounter   . Left-sided weakness   . Type 2 diabetes mellitus with complication, with long-term current use of insulin ([place])   . Chronic pain syndrome   . History of stroke   . Special screening for malignant neoplasms, colon   . Vaccine counseling   . Need for Tdap vaccination   . Need for prophylactic vaccination and inoculation against influenza   . Need for prophylactic vaccination against  Streptococcus pneumoniae (pneumococcus)      Plan: Left knee pain, laxity, injury/fall - refer to orthopedist  Diabetes - sugars running more consistent, c/t insulin, f/u with endocrinology as planned soon  chronic pain, hx/o stroke - changed Lyrica from 100mg  daily to 75mg  BID, c/t Cymbalta  Referral to GI for first colonoscopy  C/t cane and avoid falls.  Discussed fall prevention  Counseled on vaccines, recommended updated Tdap, pneumococcal vaccine, flu vaccine.  Counseled on the Tdap (tetanus, diptheria, and acellular pertussis) vaccine.  Vaccine information sheet given. Tdap vaccine given after consent obtained.  Counseled on the influenza virus vaccine.  Vaccine information sheet given.  Influenza vaccine given after consent obtained.  Counseled on the pneumococcal vaccine.  Vaccine information sheet given.  Pneumococcal vaccine PPSV23 given after consent obtained.   F/u pending referrals

## 2016-08-15 NOTE — Addendum Note (Signed)
Addended by: Arley Phenix L on: 08/15/2016 03:04 PM   Modules accepted: Orders

## 2016-08-16 ENCOUNTER — Encounter: Payer: Self-pay | Admitting: Gastroenterology

## 2016-08-19 ENCOUNTER — Encounter: Payer: Self-pay | Admitting: Internal Medicine

## 2016-08-20 ENCOUNTER — Telehealth: Payer: Self-pay | Admitting: Medical

## 2016-08-20 NOTE — Telephone Encounter (Signed)
Received another P.A. Recardo Evangelist

## 2016-08-29 ENCOUNTER — Ambulatory Visit (AMBULATORY_SURGERY_CENTER): Payer: Self-pay

## 2016-08-29 VITALS — Ht 72.0 in | Wt 270.0 lb

## 2016-08-29 DIAGNOSIS — Z1211 Encounter for screening for malignant neoplasm of colon: Secondary | ICD-10-CM

## 2016-08-29 MED ORDER — SUPREP BOWEL PREP KIT 17.5-3.13-1.6 GM/177ML PO SOLN: 1.0000 | Freq: Once | ORAL | 0 refills | Status: AC

## 2016-08-29 NOTE — Progress Notes (Signed)
No allergies to eggs or soy No past problems with anesthesia No diet meds No home oxygen  Last seizure about one year ago Kidney cancer 2004

## 2016-08-29 NOTE — Telephone Encounter (Signed)
Called pharmacy & went thru for $3

## 2016-08-29 NOTE — Telephone Encounter (Signed)
Called [facility] [phone] due to dosage change, P.A. Approved PA# [id number] for year til 08/24/2017, called pt & informed

## 2016-09-12 ENCOUNTER — Encounter: Payer: Self-pay | Admitting: Gastroenterology

## 2016-09-12 ENCOUNTER — Ambulatory Visit (AMBULATORY_SURGERY_CENTER): Payer: Medicaid Other | Admitting: Gastroenterology

## 2016-09-12 ENCOUNTER — Telehealth: Payer: Self-pay

## 2016-09-12 VITALS — BP 158/100 | HR 62 | Temp 97.1°F | Resp 9 | Ht 72.0 in | Wt 270.0 lb

## 2016-09-12 DIAGNOSIS — Z1211 Encounter for screening for malignant neoplasm of colon: Secondary | ICD-10-CM | POA: Diagnosis not present

## 2016-09-12 LAB — GLUCOSE, CAPILLARY
GLUCOSE-CAPILLARY: 341 mg/dL — AB (ref 65–99)
GLUCOSE-CAPILLARY: 303 mg/dL — AB (ref 65–99)

## 2016-09-12 MED ORDER — SODIUM CHLORIDE 0.9 % IV SOLN: 500.0000 mL | INTRAVENOUS | Status: DC

## 2016-09-12 NOTE — Progress Notes (Signed)
Report given to PACU RN, vss 

## 2016-09-12 NOTE — Telephone Encounter (Signed)
Thank you, will wait on their call.

## 2016-09-12 NOTE — Telephone Encounter (Signed)
Brandon [name],  We did not reschedule a colonoscopy in the recovery room.  The patient and his wife are going to discuss whether to do a colonoscopy or cologuard when they get home.  They will call back with their decision.  Thanks, [employer]

## 2016-09-12 NOTE — Progress Notes (Signed)
Pt. Cbg=341 and he stated that he feels great when asked what his normal range is he stated that he has gotten as high as 500. No s/s of hyperglycemia noted at this time made doctor and crna aware that pt. Had drank and of elevated cbg,and how high he runs at time. Procedure will be done later in am and will continue to moinitor cbg will not treat at this.

## 2016-09-12 NOTE — Patient Instructions (Signed)
Discharge instructions given. Handouts on diverticulosis and hemorrhoids. Resume previous medications. Family will call back after discussing whether to repeat colonoscopy. YOU HAD AN ENDOSCOPIC PROCEDURE TODAY AT [facility]:   Refer to the procedure report that was given to you for any specific questions about what was found during the examination.  If the procedure report does not answer your questions, please call your gastroenterologist to clarify.  If you requested that your care partner not be given the details of your procedure findings, then the procedure report has been included in a sealed envelope for you to review at your convenience later.  YOU SHOULD EXPECT: Some feelings of bloating in the abdomen. Passage of more gas than usual.  Walking can help get rid of the air that was put into your GI tract during the procedure and reduce the bloating. If you had a lower endoscopy (such as a colonoscopy or flexible sigmoidoscopy) you may notice spotting of blood in your stool or on the toilet paper. If you underwent a bowel prep for your procedure, you may not have a normal bowel movement for a few days.  Please Note:  You might notice some irritation and congestion in your nose or some drainage.  This is from the oxygen used during your procedure.  There is no need for concern and it should clear up in a day or so.  SYMPTOMS TO REPORT IMMEDIATELY:   Following lower endoscopy (colonoscopy or flexible sigmoidoscopy):  Excessive amounts of blood in the stool  Significant tenderness or worsening of abdominal pains  Swelling of the abdomen that is new, acute  Fever of 100F or higher   For urgent or emergent issues, a gastroenterologist can be reached at any hour by calling [phone].   DIET:  We do recommend a small meal at first, but then you may proceed to your regular diet.  Drink plenty of fluids but you should avoid alcoholic beverages for 24 hours.  ACTIVITY:   You should plan to take it easy for the rest of today and you should NOT DRIVE or use heavy machinery until tomorrow (because of the sedation medicines used during the test).    FOLLOW UP: Our staff will call the number listed on your records the next business day following your procedure to check on you and address any questions or concerns that you may have regarding the information given to you following your procedure. If we do not reach you, we will leave a message.  However, if you are feeling well and you are not experiencing any problems, there is no need to return our call.  We will assume that you have returned to your regular daily activities without incident.  If any biopsies were taken you will be contacted by phone or by letter within the next 1-3 weeks.  Please call us at [phone] if you have not heard about the biopsies in 3 weeks.    SIGNATURES/CONFIDENTIALITY: You and/or your care partner have signed paperwork which will be entered into your electronic medical record.  These signatures attest to the fact that that the information above on your After Visit Summary has been reviewed and is understood.  Full responsibility of the confidentiality of this discharge information lies with you and/or your care-partner.

## 2016-09-12 NOTE — Op Note (Signed)
[facility] Patient Name: Brandon [name] Procedure Date: 09/12/2016 9:51 AM MRN: [id number] Endoscopist: Remo Lipps P. Havery Moros , MD Age: 55 Referring MD:  Date of Birth: 1961-08-25 Gender: Male Account #: [id number] Procedure:                Colonoscopy Indications:              Screening for malignant neoplasm in the colon Medicines:                Monitored Anesthesia Care Procedure:                Pre-Anesthesia Assessment:                           - Prior to the procedure, a History and Physical                            was performed, and patient medications and                            allergies were reviewed. The patient's tolerance of                            previous anesthesia was also reviewed. The risks                            and benefits of the procedure and the sedation                            options and risks were discussed with the patient.                            All questions were answered, and informed consent                            was obtained. Prior Anticoagulants: The patient has                            taken no previous anticoagulant or antiplatelet                            agents. ASA Grade Assessment: III - A patient with                            severe systemic disease. After reviewing the risks                            and benefits, the patient was deemed in                            satisfactory condition to undergo the procedure.                           After obtaining informed consent, the colonoscope  was passed under direct vision. Throughout the                            procedure, the patient's blood pressure, pulse, and                            oxygen saturations were monitored continuously. The                            Model CF-HQ190L [id number]) scope was introduced                            through the anus and advanced to the the cecum,   identified by the ileocecal valve. The colonoscopy                            was performed without difficulty. The patient                            tolerated the procedure well. The quality of the                            bowel preparation was inadequate. The ileocecal                            valve and the rectum were photographed. Scope In: 10:04:59 AM Scope Out: 10:17:05 AM Scope Withdrawal Time: 0 hours 6 minutes 11 seconds  Total Procedure Duration: 0 hours 12 minutes 6 seconds  Findings:                 The perianal and digital rectal examinations were                            normal.                           A large amount of semi-solid stool was found in the                            entire colon, interfering with visualization. Time                            was taken to attempt to lavage the colon and clear                            the solid portions however it continually clogged                            the endoscope. The cecum, right colon, dependant                            portions, and left colon could not be well  visualized for an adequate screening exam.                           Many medium-mouthed diverticula were found in the                            transverse colon and left colon.                           Non-bleeding internal hemorrhoids were found during                            retroflexion.                           the mucosa that was visualized, no polyps were                            appreciated. Complications:            No immediate complications. Estimated blood loss:                            None. Estimated Blood Loss:     Estimated blood loss: none. Impression:               - Preparation of the colon was inadequate for                            screening purposes.                           - Stool in the entire examined colon.                           - Diverticulosis in the transverse colon and  in the                            left colon.                           - Non-bleeding internal hemorrhoids. Recommendation:           - Patient has a contact number available for                            emergencies. The signs and symptoms of potential                            delayed complications were discussed with the                            patient. Return to normal activities tomorrow.                            Written discharge instructions were provided to the  patient.                           - Resume previous diet.                           - Continue present medications.                           - Repeat colonoscopy for screening purposes using 2                            day prep, or consider other screening options such                            as FIT or Cologuard testing Remo Lipps P. Brandon Ricco, MD 09/12/2016 10:23:05 AM This report has been signed electronically.

## 2016-09-12 NOTE — Progress Notes (Signed)
Note sent to Delphina Cahill RN that we did not reschedule a colonoscopy in the recovery room.  The patient and his wife are going to discuss whether to do a colonoscopy or cologuard when they get home.  They will call back with their decision.

## 2016-09-13 ENCOUNTER — Telehealth: Payer: Self-pay

## 2016-09-13 NOTE — Telephone Encounter (Signed)
  Follow up Call-  Call back number 09/12/2016  Post procedure Call Back phone  # [phone]  Permission to leave phone message Yes  Some recent data might be hidden    Patient was called for follow up after his procedure on 09/12/2016. No answer at the number given for follow up phone call. A message was left on the answering machine.

## 2016-09-13 NOTE — Telephone Encounter (Signed)
  Follow up Call-  Call back number 09/12/2016  Post procedure Call Back phone  # [phone]  Permission to leave phone message Yes  Some recent data might be hidden    Patient was called for follow up after his procedure on 09/12/2016. No answer at the number given for follow up phone call. A message was left on the answering machine. This was the second attempt to contact the patient.

## 2016-09-14 ENCOUNTER — Other Ambulatory Visit: Payer: Self-pay

## 2016-10-11 ENCOUNTER — Telehealth: Payer: Self-pay | Admitting: Family Medicine

## 2016-10-11 ENCOUNTER — Encounter: Payer: Self-pay | Admitting: Family Medicine

## 2016-10-11 NOTE — Telephone Encounter (Signed)
Our patient called and asked for a letter from [place] along with copy of medical records as his attorney has a new paralegal and states she cannot find any info from [place].  Pt will pick up on Friday.

## 2016-10-17 ENCOUNTER — Emergency Department (HOSPITAL_COMMUNITY)
Admission: EM | Admit: 2016-10-17 | Discharge: 2016-10-18 | Disposition: A | Discharge status: Home / Self Care | Payer: Medicaid Other | Attending: Emergency Medicine | Admitting: Emergency Medicine

## 2016-10-17 ENCOUNTER — Emergency Department (HOSPITAL_COMMUNITY): Payer: Medicaid Other

## 2016-10-17 ENCOUNTER — Encounter (HOSPITAL_COMMUNITY): Payer: Self-pay | Admitting: Emergency Medicine

## 2016-10-17 DIAGNOSIS — R51 Headache: Secondary | ICD-10-CM | POA: Insufficient documentation

## 2016-10-17 DIAGNOSIS — R4701 Aphasia: Secondary | ICD-10-CM | POA: Diagnosis not present

## 2016-10-17 DIAGNOSIS — Z79899 Other long term (current) drug therapy: Secondary | ICD-10-CM | POA: Insufficient documentation

## 2016-10-17 DIAGNOSIS — E119 Type 2 diabetes mellitus without complications: Secondary | ICD-10-CM | POA: Diagnosis not present

## 2016-10-17 DIAGNOSIS — Z7982 Long term (current) use of aspirin: Secondary | ICD-10-CM | POA: Insufficient documentation

## 2016-10-17 DIAGNOSIS — Z85528 Personal history of other malignant neoplasm of kidney: Secondary | ICD-10-CM | POA: Insufficient documentation

## 2016-10-17 DIAGNOSIS — I10 Essential (primary) hypertension: Secondary | ICD-10-CM | POA: Insufficient documentation

## 2016-10-17 DIAGNOSIS — Z794 Long term (current) use of insulin: Secondary | ICD-10-CM | POA: Insufficient documentation

## 2016-10-17 DIAGNOSIS — R519 Headache, unspecified: Secondary | ICD-10-CM

## 2016-10-17 LAB — COMPREHENSIVE METABOLIC PANEL
ALBUMIN: 3.9 g/dL (ref 3.5–5.0)
ALK PHOS: 90 U/L (ref 38–126)
ALT: 20 U/L (ref 17–63)
ANION GAP: 9 (ref 5–15)
AST: 25 U/L (ref 15–41)
BILIRUBIN TOTAL: 0.6 mg/dL (ref 0.3–1.2)
BUN: 17 mg/dL (ref 6–20)
CALCIUM: 9.1 mg/dL (ref 8.9–10.3)
CO2: 26 mmol/L (ref 22–32)
Chloride: 104 mmol/L (ref 101–111)
Creatinine, Ser: 1.17 mg/dL (ref 0.61–1.24)
GFR calc Af Amer: >60 mL/min (ref >60)
GFR calc non Af Amer: >60 mL/min (ref >60)
GLUCOSE: 239 mg/dL — AB (ref 65–99)
Potassium: 3.7 mmol/L (ref 3.5–5.1)
Sodium: 139 mmol/L (ref 135–145)
TOTAL PROTEIN: 6.8 g/dL (ref 6.5–8.1)

## 2016-10-17 LAB — CBC
HCT: 37.6 % — ABNORMAL LOW (ref 39.0–52.0)
HEMOGLOBIN: 13.2 g/dL (ref 13.0–17.0)
MCH: 30.6 pg (ref 26.0–34.0)
MCHC: 35.1 g/dL (ref 30.0–36.0)
MCV: 87 fL (ref 78.0–100.0)
PLATELETS: 230 10*3/uL (ref 150–400)
RBC: 4.32 MIL/uL (ref 4.22–5.81)
RDW: 11.8 % (ref 11.5–15.5)
WBC: 5.1 10*3/uL (ref 4.0–10.5)

## 2016-10-17 LAB — LIPASE, BLOOD: Lipase: 44 U/L (ref 11–51)

## 2016-10-17 MED ORDER — HYDROCODONE-ACETAMINOPHEN 5-325 MG PO TABS: 2.0000 | ORAL_TABLET | ORAL | 0 refills | Status: DC | PRN

## 2016-10-17 NOTE — ED Provider Notes (Signed)
[facility]-EMERGENCY DEPT Provider Note   CSN: [id number] Arrival date & time: 10/17/16  1620     History   Chief Complaint Chief Complaint  Patient presents with  . Abdominal Pain  . Headache    HPI Brandon [name] is a 55 y.o. male with past medical history of stroke, bilateral renal cancer, hypertension, and diabetes presents with 2 day history of worsening speech, facial drooping, and left sided headache originating in the back of his head and radiating to his face. He characterizes his headache as throbbing, with blurry vision and double vision on the left side. Patient also complains of mid back tightness that radiates to his abdomen bilaterally. He states he feels back tightness on deep inspiration only. Patient admits to shortness of breath, nausea, decreased urination, photophobia, and decreased appetite. Patient denies chest pain and vomiting.     Past Medical History:  Diagnosis Date  . Acute ischemic stroke (HCC) 11/2013  . 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 Depression    BH Adm in [place] Depression  . Diabetes mellitus    DKA prior hospitalization  . Diverticulitis    s/p micorperforation Sept 2012-managed conservatively by Gen surgery  . ED (erectile dysfunction)   . Focal seizure (HCC) 11/2013   due to ischemic stroke  . Hypertension   . Kidney tumor 09/2011   Renal cell CA  . Wears glasses     Patient Active Problem List   Diagnosis Date Noted  . Knee joint laxity 08/15/2016  . Fall 08/15/2016  . Special screening for malignant neoplasms, colon 08/15/2016  . Vaccine counseling 08/15/2016  . Left-sided weakness 08/01/2016  . Knee injury 08/01/2016  . Left knee pain 08/01/2016  . Diabetic ulcer of left foot associated with secondary diabetes mellitus (HCC) 04/21/2016  . AKI (acute kidney injury) (HCC) 03/03/2016  . Severe episode of recurrent major depressive disorder, without psychotic features (HCC)   .  History of seizure 01/05/2016  . History of stroke 01/05/2016  . Essential hypertension 01/05/2016  . Type 2 diabetes mellitus with complication, with long-term current use of insulin (HCC) 01/05/2016  . Numbness 03/20/2015  . Other vascular headache 03/20/2015  . 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 Acute ischemic stroke (HCC) 11/16/2013  . Chronic pain syndrome 06/27/2013  . Renal cell carcinoma (HCC) 03/15/2012  . Essential hypertension, benign 03/15/2012  . Uncontrolled diabetes mellitus type 2 without complications (HCC) 03/15/2012  . Renal cell cancer (HCC) 12/30/2011  . Diverticulitis-history of 12/30/2011  . Abdominal pain 12/30/2011    Past Surgical History:  Procedure Laterality Date  . KIDNEY SURGERY     ablation of renal cell CA - 12/28, prior one was October 2012-Dr. Yamagata  . TEE WITHOUT CARDIOVERSION N/A 11/19/2013   Procedure: TRANSESOPHAGEAL ECHOCARDIOGRAM (TEE);  Surgeon: Brian S Crenshaw, MD;  Location: [facility];  Service: Cardiovascular;  Laterality: N/A;       Home Medications    Prior to Admission medications   Medication Sig Start Date End Date Taking? Authorizing Provider  aspirin 325 MG tablet Take 325 mg by mouth daily.   Yes Historical Provider, MD  atorvastatin (LIPITOR) 40 MG tablet Take 1 tablet (40 mg total) by mouth daily. 04/24/16  Yes Daniel V Thompson, MD  DULoxetine (CYMBALTA) 60 MG capsule Take 1 capsule (60 mg total) by mouth 2 (two) times daily. 08/15/16  Yes David S Tysinger, PA-C    Insulin Glargine (LANTUS) 100 UNIT/ML Solostar Pen Inject 45 Units into the skin daily at 10 pm. Patient taking differently: Inject 40-45 Units into the skin 2 (two) times daily.  05/24/16  Yes Camelia Eng Tysinger, PA-C  levETIRAcetam (KEPPRA) 500 MG tablet Take 1 tablet (500 mg total) by mouth 2 (two) times daily. Patient taking differently: Take 250 mg by mouth daily.  04/24/16  Yes Eugenie Filler, MD    lisinopril (PRINIVIL,ZESTRIL) 10 MG tablet Take 1 tablet (10 mg total) by mouth daily. 05/16/16  Yes Camelia Eng Tysinger, PA-C  pregabalin (LYRICA) 75 MG capsule Take 1 capsule (75 mg total) by mouth 2 (two) times daily. 08/15/16  Yes Camelia Eng Tysinger, PA-C  Amino Acids-Protein Hydrolys (FEEDING SUPPLEMENT, PRO-STAT SUGAR FREE 64,) LIQD Take 30 mLs by mouth 2 (two) times daily. Patient not taking: Reported on 08/29/2016 04/24/16   Eugenie Filler, MD  blood glucose meter kit and supplies Dispense based on patient and insurance preference. Use up to four times daily as directed. (FOR ICD-9 250.00, 250.01). 04/24/16   Eugenie Filler, MD  feeding supplement, GLUCERNA SHAKE, (GLUCERNA SHAKE) LIQD Take 237 mLs by mouth 3 (three) times daily between meals. Patient not taking: Reported on 10/17/2016 04/24/16   Eugenie Filler, MD  HYDROcodone-acetaminophen (NORCO/VICODIN) 5-325 MG tablet Take 2 tablets by mouth every 4 (four) hours as needed. 10/17/16   Keanon Bevins Manuel [place], [place]  insulin aspart (NOVOLOG FLEXPEN) 100 UNIT/ML FlexPen Inject 15 Units into the skin 3 (three) times daily with meals. Patient not taking: Reported on 10/17/2016 05/24/16   Camelia Eng Tysinger, PA-C  Insulin Pen Needle (PEN NEEDLES) 32G X 4 MM MISC 1 each by Does not apply route daily. Use for insulin pens 05/24/16   Camelia Eng Tysinger, PA-C  Nutritional Supplements (GLUCERNA HUNGER SMART SHAKE) LIQD Take 1 Bottle by mouth 2 (two) times daily. As a meal replacement Patient not taking: Reported on 10/17/2016 05/23/16   Carlena Hurl, PA-C    Family History Family History  Problem Relation Age of Onset  . Diabetes Father   . Colon cancer Neg Hx     Social History Social History  Substance Use Topics  . Smoking status: Never Smoker  . Smokeless tobacco: Never Used  . Alcohol use Yes     Comment: one weekly     Allergies   Patient has no known allergies.   Review of Systems Review of Systems  Constitutional: Positive  for appetite change.  Eyes: Negative for photophobia.  Cardiovascular: Negative for chest pain.  Genitourinary: Positive for decreased urine volume.  Neurological: Positive for facial asymmetry and speech difficulty.  All other systems reviewed and are negative.    Physical Exam Updated Vital Signs BP (!) 158/103 (BP Location: Right Arm)   Pulse 75   Temp 97.7 F (36.5 C) (Oral)   Resp 22   Ht 6' 0.5" (1.842 m)   Wt 117.9 kg   SpO2 100%   BMI 34.78 kg/m   Physical Exam  Constitutional: He is oriented to person, place, and time.  HENT:  Head:    Eyes: Conjunctivae and EOM are normal. Pupils are equal, round, and reactive to light.  Neck: Normal range of motion. Neck supple. No tracheal deviation present.  Cardiovascular: Normal rate and normal heart sounds.   No murmur heard. Pulmonary/Chest: Effort normal and breath sounds normal.  Abdominal: Soft. Bowel sounds are normal. He exhibits no distension and no mass. There is no tenderness.  There is no rebound and no guarding.  Musculoskeletal: Normal range of motion.  Neurological: He is alert and oriented to person, place, and time. He has normal strength. He displays no atrophy. A cranial nerve deficit (Prior stroke in 2014) and sensory deficit (Prior stroke in 2014) is present. He displays a negative Romberg sign. He displays no seizure activity. Coordination (Prior stroke in 2014) and gait (Prior stroke in 2014) abnormal. GCS eye subscore is 4. GCS verbal subscore is 5. GCS motor subscore is 6. He displays no Babinski's sign on the right side. He displays no Babinski's sign on the left side.  Skin: Skin is warm. Capillary refill takes less than 2 seconds.  Psychiatric: He has a normal mood and affect.  Nursing note and vitals reviewed.    ED Treatments / Results  Labs (all labs ordered are listed, but only abnormal results are displayed) Labs Reviewed  COMPREHENSIVE METABOLIC PANEL - Abnormal; Notable for the following:         Result Value   Glucose, Bld 239 (*)    All other components within normal limits  CBC - Abnormal; Notable for the following:    HCT 37.6 (*)    All other components within normal limits  LIPASE, BLOOD    EKG  EKG Interpretation None       Radiology Dg Chest 2 View  Result Date: 10/17/2016 CLINICAL DATA:  Acute onset of left-sided headaches and slurred speech. Initial encounter. EXAM: CHEST  2 VIEW COMPARISON:  Chest radiograph performed 07/25/2016 FINDINGS: The lungs are well-aerated. Mild bibasilar atelectasis is noted. There is no evidence of pleural effusion or pneumothorax. The heart is normal in size; the mediastinal contour is within normal limits. No acute osseous abnormalities are seen. IMPRESSION: Mild bibasilar atelectasis noted.  Lungs otherwise clear. Electronically Signed   By: Garald Balding M.D.   On: 10/17/2016 20:23   Mr Brain Wo Contrast  Result Date: 10/17/2016 CLINICAL DATA:  Initial evaluation for acute speech difficulty, facial asymmetry EXAM: MRI HEAD WITHOUT CONTRAST TECHNIQUE: Multiplanar, multiecho pulse sequences of the brain and surrounding structures were obtained without intravenous contrast. COMPARISON:  Comparison a prior MRI from 01/08/2016. FINDINGS: Brain: Cerebral volume within normal limits for patient age. Scattered subcentimeter T2/FLAIR hyperintensities noted within the periventricular and deep white matter both cerebral hemispheres, most like related to chronic microvascular ischemic disease, mild for age. Extensive encephalomalacia of within the right temporoparietal region compatible with remote right MCA territory infarct. Associated laminar necrosis noted. Associated wallerian degeneration noted as well. No abnormal foci of restricted diffusion to suggest acute or subacute ischemia. Gray-white matter differentiation otherwise maintained. No evidence for acute intracranial hemorrhage. No mass lesion, midline shift, or mass effect. Ex vacuo  dilatation of the right lateral ventricle related to the chronic right MCA territory infarct. No hydrocephalus. No extra-axial fluid collection. Major dural sinuses are grossly patent. Pituitary gland and suprasellar region within normal limits. Vascular: The major intracranial vascular flow voids are maintained. Skull and upper cervical spine: Craniocervical junction normal. Visualized upper cervical spine within normal limits. Bone marrow signal intensity normal. No scalp soft tissue abnormality. Sinuses/Orbits: Globes and orbital soft tissues within normal limits. Paranasal sinuses and mastoid air cells are clear. Inner ear structures grossly normal. The IMPRESSION: 1. No acute intracranial process identified. 2. 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 3. Mild chronic microvascular ischemic changes. Electronically Signed   By: Jeannine Boga M.D.   On: 10/17/2016 21:47  Procedures Procedures (including critical care time)  Medications Ordered in ED Medications - No data to display   Initial Impression / Assessment and Plan / ED Course  I have reviewed the triage vital signs and the nursing notes.  Pertinent labs & imaging results that were available during my care of the patient were reviewed by me and considered in my medical decision making (see chart for details).  Clinical Course   Patient is a 55-year-old male with past medical history of stroke, bilateral renal cancer, hypertension, and diabetes presents with 2 day history of worsening speech, facial drooping, and left sided headache originating in the back of his head and radiating to his face.  Patient also complains of mid back tightness that radiates to his abdomen bilaterally. He states he feels this tightness on deep inspiration only.  CMP, lipase, and CBC are otherwise normal. Brain MRI was negative for acute stroke at this time. Patient's left-sided weakness, left-sided facial droop, and slurred  speech most likely from his previous stroke in 2014. Suspicion for acute stroke is low at this time. Chest x-ray shows mild atelectasis with otherwise normal findings, no pleural effusion, no pneumothorax. This may be causing his back tightness on deep inspiration. Patient admits to not taking his blood pressure medication today and may be the cause of his current high blood pressure. Dr. Lockwood also saw patient and involved with patient care. Patient informed of results and discussed follow-up with his primary care physician and to schedule an appointment with a neurologist. Return precautions discussed for new or worsening symptoms.   Final Clinical Impressions(s) / ED Diagnoses   Final diagnoses:  Nonintractable headache, unspecified chronicity pattern, unspecified headache type  Aphasia    New Prescriptions Discharge Medication List as of 10/18/2016 12:02 AM        Manuel , PA 10/18/16 0050    Shawndale Lockwood, MD 10/21/16 1753  

## 2016-10-17 NOTE — ED Notes (Signed)
Patient transported to MRI 

## 2016-10-17 NOTE — ED Triage Notes (Signed)
Pt sts abd pain in kidney area "when I breath out of my mouth" and pain in left side of head x 3 days; pt sts hx of stroke in past

## 2016-10-17 NOTE — ED Notes (Signed)
Patient transported to X-ray 

## 2016-10-17 NOTE — ED Notes (Signed)
Patient transported back from X-ray 

## 2016-10-21 ENCOUNTER — Ambulatory Visit (INDEPENDENT_AMBULATORY_CARE_PROVIDER_SITE_OTHER): Payer: Medicaid Other | Admitting: Medical

## 2016-10-21 VITALS — BP 150/108 | HR 79 | Wt 281.8 lb

## 2016-10-21 DIAGNOSIS — R109 Unspecified abdominal pain: Secondary | ICD-10-CM

## 2016-10-21 DIAGNOSIS — N50819 Testicular pain, unspecified: Secondary | ICD-10-CM | POA: Diagnosis not present

## 2016-10-21 DIAGNOSIS — M546 Pain in thoracic spine: Secondary | ICD-10-CM | POA: Diagnosis not present

## 2016-10-21 DIAGNOSIS — Z794 Long term (current) use of insulin: Secondary | ICD-10-CM

## 2016-10-21 DIAGNOSIS — I10 Essential (primary) hypertension: Secondary | ICD-10-CM | POA: Diagnosis not present

## 2016-10-21 DIAGNOSIS — E118 Type 2 diabetes mellitus with unspecified complications: Secondary | ICD-10-CM

## 2016-10-21 DIAGNOSIS — R339 Retention of urine, unspecified: Secondary | ICD-10-CM

## 2016-10-21 DIAGNOSIS — Z598 Other problems related to housing and economic circumstances: Secondary | ICD-10-CM

## 2016-10-21 DIAGNOSIS — Z599 Problem related to housing and economic circumstances, unspecified: Secondary | ICD-10-CM

## 2016-10-21 DIAGNOSIS — Z8673 Personal history of transient ischemic attack (TIA), and cerebral infarction without residual deficits: Secondary | ICD-10-CM | POA: Diagnosis not present

## 2016-10-21 LAB — POCT URINALYSIS DIPSTICK
BILIRUBIN UA: NEGATIVE
KETONES UA: NEGATIVE
Leukocytes, UA: NEGATIVE
Nitrite, UA: NEGATIVE
Protein, UA: NEGATIVE
Urobilinogen, UA: NEGATIVE
pH, UA: 6

## 2016-10-21 LAB — PSA: PSA: 0.3 ng/mL (ref <=4.0)

## 2016-10-21 MED ORDER — CIPROFLOXACIN HCL 500 MG PO TABS: 500.0000 mg | ORAL_TABLET | Freq: Two times a day (BID) | ORAL | 0 refills | Status: DC

## 2016-10-21 NOTE — Progress Notes (Signed)
Subjective: Chief Complaint  Patient presents with  . ed follow up    ed follow up ,   Here for f/u from hospital.  Martin Majestic to the emergency dept 10/17/16.  He has a hx/o stroke, bilat renal cancer, HTN, diabetes, who was seen for back tightness, headaches left side, worsening speech, facial drooping, abdominal pain.   ED physician felt that his symptoms of facial droop and speech change was related to prior stroke.   He wasn't being compliant with his BP medications.  Was advised to f/u with PCP and neurology.    He notes he went for abdominal pain, headache, he thought he was having a stroke and heart attack.   Currently has no chest pain or abdominal pain.  He does note only urinating 2 times daily the last several days, left testicular tenderness  Saw endocrinology 3-4 weeks ago.  He note recent sugars have been looking good.    Of note, his sister passed away this morning from cancer, was on Chemotherapy.  Had breast cancer.  She also had hx/o remote gun shot wound in breast.  Baley's mother had breast cancer and aunt and grandmother all had breast cancer.  He is still pending disability determination through [employer], contact person Benjamine Mola   Past Medical History:  Diagnosis Date  . Acute ischemic stroke ([place]) 11/2013  . 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 Depression    BH Adm in [place] Depression  . Diabetes mellitus    DKA prior hospitalization  . Diverticulitis    s/p micorperforation Sept 2012-managed conservatively by Gen surgery  . ED (erectile dysfunction)   . Focal seizure ([place]) 11/2013   due to ischemic stroke  . Hypertension   . Kidney tumor 09/2011   Renal cell CA  . Wears glasses    Current Outpatient Prescriptions on File Prior to Visit  Medication Sig Dispense Refill  . aspirin 325 MG tablet Take 325 mg by mouth daily.    [name] atorvastatin (LIPITOR) 40 MG tablet Take 1 tablet (40 mg total) by mouth daily. 30  tablet 0  . blood glucose meter kit and supplies Dispense based on patient and insurance preference. Use up to four times daily as directed. (FOR ICD-9 250.00, 250.01). 1 each 0  . DULoxetine (CYMBALTA) 60 MG capsule Take 1 capsule (60 mg total) by mouth 2 (two) times daily. 60 capsule 2  . HYDROcodone-acetaminophen (NORCO/VICODIN) 5-325 MG tablet Take 2 tablets by mouth every 4 (four) hours as needed. 12 tablet 0  . Insulin Glargine (LANTUS) 100 UNIT/ML Solostar Pen Inject 45 Units into the skin daily at 10 pm. (Patient taking differently: Inject 40-45 Units into the skin 2 (two) times daily. ) 15 mL 3  . Insulin Pen Needle (PEN NEEDLES) 32G X 4 MM MISC 1 each by Does not apply route daily. Use for insulin pens 200 each 2  . levETIRAcetam (KEPPRA) 500 MG tablet Take 1 tablet (500 mg total) by mouth 2 (two) times daily. (Patient taking differently: Take 250 mg by mouth daily. ) 60 tablet 0  . lisinopril (PRINIVIL,ZESTRIL) 10 MG tablet Take 1 tablet (10 mg total) by mouth daily. 90 tablet 3  . pregabalin (LYRICA) 75 MG capsule Take 1 capsule (75 mg total) by mouth 2 (two) times daily. 60 capsule 0  . insulin aspart (NOVOLOG FLEXPEN) 100 UNIT/ML FlexPen Inject 15 Units into the skin 3 (three) times daily with meals. (  Patient not taking: Reported on 10/21/2016) 15 mL 3  . Nutritional Supplements (GLUCERNA HUNGER SMART SHAKE) LIQD Take 1 Bottle by mouth 2 (two) times daily. As a meal replacement 60 Bottle 2   Current Facility-Administered Medications on File Prior to Visit  Medication Dose Route Frequency Provider Last Rate Last Dose  . 0.9 %  sodium chloride infusion  500 mL Intravenous Continuous Manus Gunning, MD       Past Surgical History:  Procedure Laterality Date  . KIDNEY SURGERY     ablation of renal cell CA - 12/28, prior one was October 2012-Dr. Kathlene Cote  . TEE WITHOUT CARDIOVERSION N/A 11/19/2013   Procedure: TRANSESOPHAGEAL ECHOCARDIOGRAM (TEE);  Surgeon: Lelon Perla, MD;   Location: [facility] ENDOSCOPY;  Service: Cardiovascular;  Laterality: N/A;   Family History  Problem Relation Age of Onset  . Diabetes Father   . Colon cancer Neg Hx      ROS as in subjective   Objective: BP (!) 150/108   Pulse 79   Wt 281 lb 12.8 oz (127.8 kg)   SpO2 97%   BMI 37.69 kg/m   Wt Readings from Last 3 Encounters:  10/21/16 281 lb 12.8 oz (127.8 kg)  10/17/16 260 lb (117.9 kg)  09/12/16 270 lb (122.5 kg)   BP Readings from Last 3 Encounters:  10/21/16 (!) 150/108  10/17/16 (!) 158/103  09/12/16 (!) 158/100    General appearance: alert, no distress, WD/WN HEENT: normocephalic, sclerae anicteric, PERRLA, EOMi, nares patent, no discharge or erythema, pharynx normal Oral cavity: MMM Neck: supple, no lymphadenopathy, no thyromegaly, no masses Heart: RRR, normal S1, S2, no murmurs Lungs: CTA bilaterally, no wheezes, rhonchi, or rales Mild chest wall tenderness right posterior mid back Abdomen: +bs, soft, non tender, non distended, no masses, no hepatomegaly, no splenomegaly Musculoskeletal: arms and leg non tender, no swelling, no obvious deformity Extremities: no edema, no cyanosis, no clubbing Pulses: 2+ symmetric, upper and lower extremities, normal cap refill 4-5/5 strength left arm and leg compared to right, stuttering, otherwise unremarkable exam Psychiatric: normal affect, behavior normal, pleasant  GU: mild left testicular tenders, no swelling, no penile tenderness, no hernia.    Declines DRE   MRI brain 10/17/16 reviewed IMPRESSION: 1. No acute intracranial process identified. 2. 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 3. Mild chronic microvascular ischemic changes.  CXR 10/17/16: IMPRESSION: Mild bibasilar atelectasis noted.  Lungs otherwise clear.  Recent CMET 10/17/16 showing normal other than 239 glucose, CBC unremarkable, lipase normal.    Assessment: Encounter Diagnoses  Name Primary?  . Essential  hypertension, benign Yes  . Type 2 diabetes mellitus with complication, with long-term current use of insulin ([place])   . Urinary retention   . Testicular discomfort   . Acute right-sided thoracic back pain   . Abdominal pain, unspecified abdominal location   . Financial difficulty   . History of stroke      Plan: HTN - discussed not running out of medication.  He is still c/o financial difficulties, amazingly he is still not approved for disability, this is in process.  C/t same medication, f/u 2wk  Diabetes - he apparently was not able to get bydureon due to insurance denial.   We gave him Tanzeum 30 we had to at least get him by in the meantime.  Gave 5 sample pens to last 5 weeks.  We actually did tried to do one injection today in the office but the pen malfunctioned, plunger wouldn't depress  and not of the medication liquid deposited in the chamber for injection.   Thus, he will begin first infection tonight.  Reviewed 09/26/16 consult with [facility] endocrinology in [facility].    Was put on Bydureon, was advised to decreased Lantus to 35 BID.  Was advised f/u 10 weeks.    Begin Cipro for possible urinary tract infection vs prostatitis.    Harless was seen today for ed follow up.  Diagnoses and all orders for this visit:  Essential hypertension, benign  Type 2 diabetes mellitus with complication, with long-term current use of insulin ([facility])  Urinary retention -     POCT urinalysis dipstick -     PSA  Testicular discomfort -     POCT urinalysis dipstick -     PSA  Acute right-sided thoracic back pain  Abdominal pain, unspecified abdominal location -     POCT urinalysis dipstick  Financial difficulty  History of stroke  Other orders -     ciprofloxacin (CIPRO) 500 MG tablet; Take 1 tablet (500 mg total) by mouth 2 (two) times daily.

## 2016-10-21 NOTE — Patient Instructions (Addendum)
Recommendations  Since Bydureon wasn't covers by insurance, we started you on Tanzeum weekly injection today  Tanzeum is very similar to Bydureon  I gave you enough samples to last the next 5 weeks of Tanzeum  Don't run out of your blood pressure medications!!! (lisinopril 10mg  daily)  I also gave you some Lyrica samples today  Continue all of your medications as usual  Begin Cipro antibiotic for possible genitourinary infection  We will call about lab results   Lets see you back in 2 weeks

## 2016-10-23 LAB — URINE CULTURE: Organism ID, Bacteria: NO GROWTH

## 2016-11-04 ENCOUNTER — Encounter: Payer: Self-pay | Admitting: Medical

## 2016-11-04 ENCOUNTER — Telehealth: Payer: Self-pay | Admitting: Medical

## 2016-11-04 ENCOUNTER — Ambulatory Visit: Payer: Medicaid Other | Admitting: Medical

## 2016-11-04 ENCOUNTER — Ambulatory Visit (INDEPENDENT_AMBULATORY_CARE_PROVIDER_SITE_OTHER): Payer: Medicaid Other | Admitting: Medical

## 2016-11-04 ENCOUNTER — Other Ambulatory Visit: Payer: Self-pay

## 2016-11-04 VITALS — BP 138/90 | HR 62 | Wt 276.2 lb

## 2016-11-04 DIAGNOSIS — I6930 Unspecified sequelae of cerebral infarction: Secondary | ICD-10-CM | POA: Diagnosis not present

## 2016-11-04 DIAGNOSIS — K5900 Constipation, unspecified: Secondary | ICD-10-CM | POA: Diagnosis not present

## 2016-11-04 DIAGNOSIS — E1310 Other specified diabetes mellitus with ketoacidosis without coma: Secondary | ICD-10-CM | POA: Diagnosis not present

## 2016-11-04 DIAGNOSIS — Z8673 Personal history of transient ischemic attack (TIA), and cerebral infarction without residual deficits: Secondary | ICD-10-CM | POA: Diagnosis not present

## 2016-11-04 DIAGNOSIS — E119 Type 2 diabetes mellitus without complications: Secondary | ICD-10-CM

## 2016-11-04 DIAGNOSIS — G894 Chronic pain syndrome: Secondary | ICD-10-CM

## 2016-11-04 DIAGNOSIS — R269 Unspecified abnormalities of gait and mobility: Secondary | ICD-10-CM

## 2016-11-04 DIAGNOSIS — IMO0001 Reserved for inherently not codable concepts without codable children: Secondary | ICD-10-CM

## 2016-11-04 DIAGNOSIS — R519 Headache, unspecified: Secondary | ICD-10-CM

## 2016-11-04 DIAGNOSIS — Z794 Long term (current) use of insulin: Secondary | ICD-10-CM

## 2016-11-04 DIAGNOSIS — R109 Unspecified abdominal pain: Secondary | ICD-10-CM | POA: Diagnosis not present

## 2016-11-04 DIAGNOSIS — G441 Vascular headache, not elsewhere classified: Secondary | ICD-10-CM

## 2016-11-04 DIAGNOSIS — R531 Weakness: Secondary | ICD-10-CM

## 2016-11-04 DIAGNOSIS — I10 Essential (primary) hypertension: Secondary | ICD-10-CM

## 2016-11-04 DIAGNOSIS — R3129 Other microscopic hematuria: Secondary | ICD-10-CM

## 2016-11-04 DIAGNOSIS — R51 Headache: Secondary | ICD-10-CM | POA: Diagnosis not present

## 2016-11-04 LAB — POCT URINALYSIS DIPSTICK
Bilirubin, UA: NEGATIVE
Blood, UA: NEGATIVE
Ketones, UA: NEGATIVE
LEUKOCYTES UA: NEGATIVE
NITRITE UA: NEGATIVE
Spec Grav, UA: >=1.03
UROBILINOGEN UA: NEGATIVE
pH, UA: 5.5

## 2016-11-04 MED ORDER — PREGABALIN 100 MG PO CAPS: 100.0000 mg | ORAL_CAPSULE | Freq: Two times a day (BID) | ORAL | 2 refills | Status: DC

## 2016-11-04 MED ORDER — LISINOPRIL 10 MG PO TABS: 10.0000 mg | ORAL_TABLET | Freq: Every day | ORAL | 3 refills | Status: DC

## 2016-11-04 MED ORDER — DULOXETINE HCL 60 MG PO CPEP: 60.0000 mg | ORAL_CAPSULE | Freq: Two times a day (BID) | ORAL | 3 refills | Status: DC

## 2016-11-04 MED ORDER — INSULIN GLARGINE 100 UNIT/ML SOLOSTAR PEN: 45.0000 [IU] | PEN_INJECTOR | Freq: Every day | SUBCUTANEOUS | 3 refills | Status: DC

## 2016-11-04 MED ORDER — POLYETHYLENE GLYCOL 3350 17 GM/SCOOP PO POWD: ORAL | 2 refills | Status: DC

## 2016-11-04 MED ORDER — LISINOPRIL 20 MG PO TABS: 20.0000 mg | ORAL_TABLET | Freq: Every day | ORAL | 3 refills | Status: DC

## 2016-11-04 MED ORDER — ATORVASTATIN CALCIUM 40 MG PO TABS
40.0000 mg | ORAL_TABLET | Freq: Every day | ORAL | 3 refills | Status: DC
Start: 2016-11-04 — End: 2018-02-12

## 2016-11-04 MED ORDER — LEVETIRACETAM 500 MG PO TABS: ORAL_TABLET | ORAL | 3 refills | Status: DC

## 2016-11-04 NOTE — Telephone Encounter (Signed)
Please refer back to Dr. Leonie Man at [facility] for stroke, chronic pain and headache f/u

## 2016-11-04 NOTE — Telephone Encounter (Signed)
Order entered for referral. They will call pt to schedule appt. Brandon [name]

## 2016-11-04 NOTE — Progress Notes (Signed)
Subjective: Chief Complaint  Patient presents with  . follow up    follow up dm    Here with girlfriend who provides his transportation.     I saw him 2 weeks ago at which time his endocrinologists tried to prescribe bydureon but this wasn't approved.  He still does the insulin, but we gave tanzeium samples last visit since bydureon wasn't approved awaiting prior auth.  Every one of the 4 samples of Tanzeium malfunctions apparently.   His endocrinologist is aware of his predicament and he has f/u in 12/2016 with endocrinology.    He is compliant with BP medication  He is compliance with aspirin and statin  He notes his pain is not controlled despite lyrica increased to 63m BID in 08/15/16.  Still taking cymbalta BID  He is having some bowel issues, no BM in 3 days.   He saw GI recently , had colonoscopy but this will have to be repeated.   He needs all his medications change to [facility] so they will deliver them.  Working with home health for [facility]  Needs new dentist recommendations  Here for f/u on microscopic hematuria noted last time.  Past Medical History:  Diagnosis Date  . [place]  . 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 [place]  . Diabetes [place]    DKA prior hospitalization  . Diverticulitis    s/p micorperforation Sept 2012-managed conservatively by Gen surgery  . ED (erectile dysfunction)   . [place]   due to ischemic stroke  . Hypertension   . Kidney tumor 09/2011   Renal cell CA  . Wears glasses    Current Outpatient Prescriptions on File Prior to Visit  Medication Sig Dispense Refill  . aspirin 325 MG tablet Take 325 mg by mouth daily.    . blood glucose meter kit and supplies Dispense based on patient and insurance preference. Use up to four times daily as directed. (FOR ICD-9 250.00, 250.01). 1 each 0  .  Insulin Pen Needle (PEN NEEDLES) 32G X 4 MM MISC 1 each by Does not apply route daily. Use for insulin pens 200 each 2  . pregabalin (LYRICA) 75 MG capsule Take 1 capsule (75 mg total) by mouth 2 (two) times daily. 60 capsule 0  . HYDROcodone-acetaminophen (NORCO/VICODIN) 5-325 MG tablet Take 2 tablets by mouth every 4 (four) hours as needed. (Patient not taking: Reported on 11/04/2016) 12 tablet 0  . insulin aspart (NOVOLOG FLEXPEN) 100 UNIT/ML FlexPen Inject 15 Units into the skin 3 (three) times daily with meals. (Patient not taking: Reported on 11/04/2016) 15 mL 3  . Nutritional Supplements (GLUCERNA HUNGER SMART SHAKE) LIQD Take 1 Bottle by mouth 2 (two) times daily. As a meal replacement (Patient not taking: Reported on 11/04/2016) 60 Bottle 2   Current Facility-Administered Medications on File Prior to Visit  Medication Dose Route Frequency Provider Last Rate Last Dose  . 0.9 %  sodium chloride infusion  500 mL Intravenous Continuous SManus Gunning MD       ROS as in subjective   Objective: BP 138/90   Pulse 62   Wt 276 lb 3.2 oz (125.3 kg)   SpO2 98%   BMI 36.94 kg/m   BP Readings from Last 3 Encounters:  11/04/16 138/90  10/21/16 (!) 150/108  10/17/16 (!) 158/103   Wt Readings from  Last 3 Encounters:  11/04/16 276 lb 3.2 oz (125.3 kg)  10/21/16 281 lb 12.8 oz (127.8 kg)  10/17/16 260 lb (117.9 kg)   General appearance: alert, no distress, WD/WN HEENT: normocephalic, sclerae anicteric, PERRLA, EOMi, nares patent, no discharge or erythema, pharynx normal Oral cavity: MMM Neck: supple, no lymphadenopathy, no thyromegaly, no masses Heart: RRR, normal S1, S2, no murmurs Lungs: CTA bilaterally, no wheezes, rhonchi, or rales Abdomen: +bs, soft, non tender, non distended, no masses, no hepatomegaly, no splenomegaly Musculoskeletal: arms and leg non tender, no swelling, no obvious deformity Extremities: no edema, no cyanosis, no clubbing Pulses: 2+ symmetric, upper and  lower extremities, normal cap refill 4-5/5 strength left arm and leg compared to right, stuttering, otherwise unremarkable exam Psychiatric: normal affect, behavior normal, pleasant    Assessment: Encounter Diagnoses  Name Primary?  . Other vascular headache Yes  . Frequent headaches   . Essential hypertension   . Abdominal pain, unspecified abdominal location   . Constipation, unspecified constipation type   . History of stroke   . IDDM (insulin dependent diabetes mellitus) ([place])   . Chronic pain syndrome   . Late effect of stroke   . Uncontrolled type 2 diabetes mellitus with ketoacidosis without coma, unspecified long term insulin use status ([place])   . Microscopic hematuria   . Gait disturbance   . Left-sided weakness     Plan: Headaches- refer back to neurology for f/u on this and hx/o stroke, hx/o head injury HTN - increase to Lisinopril 65m daily, c/t statin, c/t aspirin daily abdominal pain, constipation - had poor prep on recent colonoscopy and has some recent constipation.   F/u with GI.  Begin miralax 1 cap full daily IDDM - f/u in January with endocrinology as planned, c/t insulin as per endocrinology instructions Chronic pain - increase Lyrica to 1027mBID, c/t cymbalta 6029m capsul BID Microscopic hematuria - resolved Gait disturbance, chronic weakness - f/u  With neurology.  Wrote letter on his behalf that he can't attend court out of state given his physical limitations.  [name] seen today for follow up.  Diagnoses and all orders for this visit:  Other vascular headache  Frequent headaches  Essential hypertension  Abdominal pain, unspecified abdominal location  Constipation, unspecified constipation type  History of stroke  IDDM (insulin dependent diabetes mellitus) ([facility])  Chronic pain syndrome  Late effect of stroke  Uncontrolled type 2 diabetes mellitus with ketoacidosis without coma, unspecified long term insulin use status ([facility]) -      Urinalysis Dipstick  Microscopic hematuria  Gait disturbance  Left-sided weakness  Other orders -     atorvastatin (LIPITOR) 40 MG tablet; Take 1 tablet (40 mg total) by mouth daily. -     DULoxetine (CYMBALTA) 60 MG capsule; Take 1 capsule (60 mg total) by mouth 2 (two) times daily. -     Discontinue: lisinopril (PRINIVIL,ZESTRIL) 10 MG tablet; Take 1 tablet (10 mg total) by mouth daily. -     levETIRAcetam (KEPPRA) 500 MG tablet; 1/2 tablet po BID -     pregabalin (LYRICA) 100 MG capsule; Take 1 capsule (100 mg total) by mouth 2 (two) times daily. -     polyethylene glycol powder (GLYCOLAX/MIRALAX) powder; 1 cap full daily -     lisinopril (PRINIVIL,ZESTRIL) 20 MG tablet; Take 1 tablet (20 mg total) by mouth daily.

## 2016-11-17 ENCOUNTER — Encounter: Payer: Self-pay | Admitting: Gastroenterology

## 2016-11-17 ENCOUNTER — Telehealth: Payer: Self-pay

## 2016-11-17 DIAGNOSIS — M25562 Pain in left knee: Principal | ICD-10-CM

## 2016-11-17 DIAGNOSIS — G894 Chronic pain syndrome: Secondary | ICD-10-CM

## 2016-11-17 DIAGNOSIS — G8929 Other chronic pain: Secondary | ICD-10-CM

## 2016-11-17 NOTE — Telephone Encounter (Signed)
Brandon [name]- Dr. Primus Bravo office called back- they did see pt in the past- Is it ok for [place] to initiate this referral if Dr. Primus Bravo gives the okay for pt to return under his care.

## 2016-11-17 NOTE — Telephone Encounter (Signed)
Pt stopped by the office today with concerns of 2 referrals. States he needed a referral to Dr. Leonie Man- this has been placed prior by [name], and called [facility] to schedule this for pt on 12/23/2015 @ 10am.   He states that he saw a nerve specialist for pain management, Dr. Mohammed Kindle and he would like to go back to him for his pain. Awaiting callback from his office to get more information on his pain management and referral process.  Office # [phone]. [place]. [name]

## 2016-11-18 NOTE — Telephone Encounter (Signed)
That is fine, refer to pain management

## 2016-11-21 NOTE — Telephone Encounter (Signed)
Attempted call to Dr. Primus Bravo office. Mailbox is full. Will attempt referral later.

## 2016-11-24 NOTE — Telephone Encounter (Signed)
LM for Dr. Ethel Rana office to callback. Brandon [name]

## 2016-11-30 NOTE — Telephone Encounter (Signed)
LM for Dr. Ethel Rana office to [facility]. Brandon [name]

## 2016-12-01 NOTE — Telephone Encounter (Signed)
Dr Primus Bravo office called and will not see this patient.

## 2016-12-02 NOTE — Telephone Encounter (Signed)
Refer to pain management specialist?

## 2016-12-02 NOTE — Telephone Encounter (Signed)
Send to Dr. Mirna Mires, pain management

## 2016-12-06 NOTE — Telephone Encounter (Signed)
Information faxed to Dr. Mirna Mires office. Brandon [name]

## 2016-12-07 NOTE — Telephone Encounter (Signed)
Pt aware we are awaiting CB from Dr. Earnie Larsson office

## 2016-12-15 ENCOUNTER — Ambulatory Visit: Payer: Medicaid Other | Admitting: Medical

## 2016-12-22 ENCOUNTER — Emergency Department (HOSPITAL_COMMUNITY)
Admission: EM | Admit: 2016-12-22 | Discharge: 2016-12-22 | Disposition: A | Discharge status: Home / Self Care | Payer: Medicaid Other | Attending: Emergency Medicine | Admitting: Emergency Medicine

## 2016-12-22 ENCOUNTER — Institutional Professional Consult (permissible substitution): Payer: Medicaid Other | Admitting: Neurology

## 2016-12-22 ENCOUNTER — Encounter (HOSPITAL_COMMUNITY): Payer: Self-pay

## 2016-12-22 DIAGNOSIS — E119 Type 2 diabetes mellitus without complications: Secondary | ICD-10-CM | POA: Insufficient documentation

## 2016-12-22 DIAGNOSIS — Z794 Long term (current) use of insulin: Secondary | ICD-10-CM | POA: Diagnosis not present

## 2016-12-22 DIAGNOSIS — R519 Headache, unspecified: Secondary | ICD-10-CM

## 2016-12-22 DIAGNOSIS — Z79899 Other long term (current) drug therapy: Secondary | ICD-10-CM | POA: Diagnosis not present

## 2016-12-22 DIAGNOSIS — R51 Headache: Secondary | ICD-10-CM | POA: Insufficient documentation

## 2016-12-22 DIAGNOSIS — Z8673 Personal history of transient ischemic attack (TIA), and cerebral infarction without residual deficits: Secondary | ICD-10-CM | POA: Diagnosis not present

## 2016-12-22 DIAGNOSIS — I10 Essential (primary) hypertension: Secondary | ICD-10-CM | POA: Diagnosis not present

## 2016-12-22 DIAGNOSIS — Z85528 Personal history of other malignant neoplasm of kidney: Secondary | ICD-10-CM | POA: Insufficient documentation

## 2016-12-22 DIAGNOSIS — Z7982 Long term (current) use of aspirin: Secondary | ICD-10-CM | POA: Insufficient documentation

## 2016-12-22 MED ORDER — KETOROLAC TROMETHAMINE 15 MG/ML IJ SOLN
15.0000 mg | Freq: Once | INTRAMUSCULAR | Status: AC
  Administered 2016-12-22: 15 mg via INTRAVENOUS
  Filled 2016-12-22: qty 1

## 2016-12-22 MED ORDER — PROCHLORPERAZINE EDISYLATE 5 MG/ML IJ SOLN
10.0000 mg | Freq: Once | INTRAMUSCULAR | Status: AC
  Administered 2016-12-22: 10 mg via INTRAVENOUS
  Filled 2016-12-22: qty 2

## 2016-12-22 NOTE — Discharge Instructions (Signed)
Follow up with neurology as planned.

## 2016-12-22 NOTE — ED Notes (Signed)
ED Provider at bedside. 

## 2016-12-22 NOTE — ED Triage Notes (Signed)
Pt reports he has had migraine X2 weeks. He reports intermittent nausea as well. Pt reports hx of stroke. Pt denies any new extremity weakness.

## 2016-12-22 NOTE — ED Notes (Signed)
Spoke to EDP in regards to patient's symptoms. Pt is not currently exhibiting any acute stroke symptoms only headache. No new orders at this time.

## 2016-12-22 NOTE — ED Provider Notes (Signed)
[facility] DEPT Provider Note   CSN: [id number] Arrival date & time: 12/22/16  1436     History   Chief Complaint Chief Complaint  Patient presents with  . Migraine    HPI Brandon [name] is a 56 y.o. male.  HPI patient presents with headache over the last 2 weeks. It is throbbing on the back of his head. It is like previous headaches that he has had. States it also feels like his previous stroke. Occasional nausea. No photophobia. Has some chronic left-sided weakness that is unchanged. No vision changes. States he was due to see neurology today for the headaches but they had to cancel because of the weather. No trauma to his head but had reportedly had some falls where he did not hit his head. Past Medical History:  Diagnosis Date  . Acute ischemic stroke ([place]) 11/2013  . 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 Depression    BH Adm in [place] Depression  . Diabetes mellitus    DKA prior hospitalization  . Diverticulitis    s/p micorperforation Sept 2012-managed conservatively by Gen surgery  . ED (erectile dysfunction)   . Focal seizure ([place]) 11/2013   due to ischemic stroke  . Hypertension   . Kidney tumor 09/2011   Renal cell CA  . Wears glasses     Patient Active Problem List   Diagnosis Date Noted  . Late effect of stroke 11/04/2016  . Constipation 11/04/2016  . Microscopic hematuria 11/04/2016  . Gait disturbance 11/04/2016  . Financial difficulty 10/21/2016  . Acute right-sided thoracic back pain 10/21/2016  . Testicular discomfort 10/21/2016  . Urinary retention 10/21/2016  . Knee joint laxity 08/15/2016  . Fall 08/15/2016  . Special screening for malignant neoplasms, colon 08/15/2016  . Vaccine counseling 08/15/2016  . Left-sided weakness 08/01/2016  . Knee injury 08/01/2016  . Left knee pain 08/01/2016  . Diabetic ulcer of left foot associated with secondary diabetes mellitus ([place]) 04/21/2016  . AKI (acute  kidney injury) ([place]) 03/03/2016  . Severe episode of recurrent major depressive disorder, without psychotic features ([place])   . History of seizure 01/05/2016  . History of stroke 01/05/2016  . Essential hypertension 01/05/2016  . Uncontrolled type 2 diabetes mellitus with ketoacidosis without coma ([place]) 01/05/2016  . Numbness 03/20/2015  . Other vascular headache 03/20/2015  . 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 Acute ischemic stroke ([place]) 11/16/2013  . Chronic pain syndrome 06/27/2013  . Renal cell carcinoma ([place]) 03/15/2012  . Essential hypertension, benign 03/15/2012  . Uncontrolled diabetes mellitus type 2 without complications ([place]) 60/73/7106  . Renal cell cancer ([place]) 12/30/2011  . Diverticulitis-history of 12/30/2011  . Abdominal pain 12/30/2011    Past Surgical History:  Procedure Laterality Date  . KIDNEY SURGERY     ablation of renal cell CA - 12/28, prior one was October 2012-Dr. Kathlene Cote  . TEE WITHOUT CARDIOVERSION N/A 11/19/2013   Procedure: TRANSESOPHAGEAL ECHOCARDIOGRAM (TEE);  Surgeon: Lelon Perla, MD;  Location: [facility] ENDOSCOPY;  Service: Cardiovascular;  Laterality: N/A;       Home Medications    Prior to Admission medications   Medication Sig Start Date End Date Taking? Authorizing Provider  aspirin 325 MG tablet Take 325 mg by mouth every other day.    Yes Historical Provider, MD  atorvastatin (LIPITOR) 40 MG tablet Take 1 tablet (40 mg total) by mouth daily. 11/04/16  Yes  Camelia Eng Tysinger, PA-C  blood glucose meter kit and supplies Dispense based on patient and insurance preference. Use up to four times daily as directed. (FOR ICD-9 250.00, 250.01). 04/24/16  Yes Eugenie Filler, MD  DULoxetine (CYMBALTA) 60 MG capsule Take 1 capsule (60 mg total) by mouth 2 (two) times daily. 11/04/16  Yes Camelia Eng Tysinger, PA-C  Insulin Glargine (LANTUS) 100 UNIT/ML Solostar Pen Inject 45 Units into the skin daily at 10  pm. Patient taking differently: Inject 45 Units into the skin 2 (two) times daily.  11/04/16  Yes Camelia Eng Tysinger, PA-C  Insulin Pen Needle (PEN NEEDLES) 32G X 4 MM MISC 1 each by Does not apply route daily. Use for insulin pens 05/24/16  Yes Camelia Eng Tysinger, PA-C  levETIRAcetam (KEPPRA) 500 MG tablet 1/2 tablet po BID Patient taking differently: Take 250 mg by mouth daily.  11/04/16  Yes Camelia Eng Tysinger, PA-C  lisinopril (PRINIVIL,ZESTRIL) 20 MG tablet Take 1 tablet (20 mg total) by mouth daily. 11/04/16  Yes Camelia Eng Tysinger, PA-C  polyethylene glycol powder (GLYCOLAX/MIRALAX) powder 1 cap full daily Patient taking differently: Take 17 g by mouth daily.  11/04/16  Yes Camelia Eng Tysinger, PA-C  pregabalin (LYRICA) 100 MG capsule Take 1 capsule (100 mg total) by mouth 2 (two) times daily. Patient taking differently: Take 100 mg by mouth See admin instructions. One to two times a day 11/04/16  Yes Camelia Eng Tysinger, PA-C  HYDROcodone-acetaminophen (NORCO/VICODIN) 5-325 MG tablet Take 2 tablets by mouth every 4 (four) hours as needed. Patient not taking: Reported on 11/04/2016 10/17/16   [facility] [place], [place]  insulin aspart (NOVOLOG FLEXPEN) 100 UNIT/ML FlexPen Inject 15 Units into the skin 3 (three) times daily with meals. Patient not taking: Reported on 12/22/2016 05/24/16   Camelia Eng Tysinger, PA-C  Nutritional Supplements (GLUCERNA HUNGER SMART SHAKE) LIQD Take 1 Bottle by mouth 2 (two) times daily. As a meal replacement Patient not taking: Reported on 11/04/2016 05/23/16   Camelia Eng Tysinger, PA-C  pregabalin (LYRICA) 75 MG capsule Take 1 capsule (75 mg total) by mouth 2 (two) times daily. Patient not taking: Reported on 12/22/2016 08/15/16   Carlena Hurl, PA-C    Family History Family History  Problem Relation Age of Onset  . Diabetes Father   . Colon cancer Neg Hx     Social History Social History  Substance Use Topics  . Smoking status: Never Smoker  . Smokeless tobacco: Never Used  .  Alcohol use Yes     Comment: one weekly     Allergies   Morphine   Review of Systems Review of Systems  Constitutional: Negative for appetite change.  HENT: Negative for congestion.   Eyes: Negative for redness.  Respiratory: Negative for shortness of breath.   Genitourinary: Negative for flank pain.  Musculoskeletal: Negative for back pain.  Neurological: Positive for weakness and headaches.  Hematological: Negative for adenopathy.  Psychiatric/Behavioral: Negative for confusion.     Physical Exam Updated Vital Signs BP 159/100 (BP Location: Right Arm)   Pulse 78   Temp 97.9 F (36.6 C) (Oral)   Resp 16   SpO2 99%   Physical Exam  Constitutional: He is oriented to person, place, and time. He appears well-developed and well-nourished.  HENT:  Head: Atraumatic.  Eyes: Pupils are equal, round, and reactive to light.  Neck: Neck supple.  Cardiovascular: Normal rate.   Pulmonary/Chest: Effort normal.  Abdominal: Soft.  Neurological: He is alert and  oriented to person, place, and time.  Patient has a stutter and some chronic left-sided weakness.  Skin: Skin is warm.     ED Treatments / Results  Labs (all labs ordered are listed, but only abnormal results are displayed) Labs Reviewed - No data to display  EKG  EKG Interpretation None       Radiology No results found.  Procedures Procedures (including critical care time)  Medications Ordered in ED Medications  prochlorperazine (COMPAZINE) injection 10 mg (10 mg Intravenous Given 12/22/16 1856)  ketorolac (TORADOL) 15 MG/ML injection 15 mg (15 mg Intravenous Given 12/22/16 1856)     Initial Impression / Assessment and Plan / ED Course  I have reviewed the triage vital signs and the nursing notes.  Pertinent labs & imaging results that were available during my care of the patient were reviewed by me and considered in my medical decision making (see chart for details).     Patient with acute on  chronic headache. Become a with hypertension that resolved as headache was treated. Feels much better. Will discharge home. Has follow-up with neurology on Monday. baseline l neuro exam.  Final Clinical Impressions(s) / ED Diagnoses   Final diagnoses:  Nonintractable headache, unspecified chronicity pattern, unspecified headache type    New Prescriptions New Prescriptions   No medications on file     Davonna Belling, MD 12/22/16 2049

## 2016-12-26 NOTE — Telephone Encounter (Signed)
Office closed today- they will reopen 12/29/2015 [facility]

## 2016-12-29 ENCOUNTER — Other Ambulatory Visit: Payer: Self-pay | Admitting: Medical

## 2016-12-29 ENCOUNTER — Encounter: Payer: Self-pay | Admitting: Medical

## 2016-12-29 ENCOUNTER — Ambulatory Visit (INDEPENDENT_AMBULATORY_CARE_PROVIDER_SITE_OTHER): Payer: Medicaid Other | Admitting: Medical

## 2016-12-29 VITALS — BP 122/90 | HR 80 | Resp 18 | Ht 72.0 in | Wt 281.0 lb

## 2016-12-29 DIAGNOSIS — G479 Sleep disorder, unspecified: Secondary | ICD-10-CM | POA: Diagnosis not present

## 2016-12-29 DIAGNOSIS — G894 Chronic pain syndrome: Secondary | ICD-10-CM | POA: Diagnosis not present

## 2016-12-29 DIAGNOSIS — I639 Cerebral infarction, unspecified: Secondary | ICD-10-CM

## 2016-12-29 DIAGNOSIS — I10 Essential (primary) hypertension: Secondary | ICD-10-CM

## 2016-12-29 DIAGNOSIS — G441 Vascular headache, not elsewhere classified: Secondary | ICD-10-CM

## 2016-12-29 DIAGNOSIS — IMO0002 Reserved for concepts with insufficient information to code with codable children: Secondary | ICD-10-CM

## 2016-12-29 DIAGNOSIS — G4489 Other headache syndrome: Secondary | ICD-10-CM

## 2016-12-29 DIAGNOSIS — Z8673 Personal history of transient ischemic attack (TIA), and cerebral infarction without residual deficits: Secondary | ICD-10-CM

## 2016-12-29 DIAGNOSIS — H539 Unspecified visual disturbance: Secondary | ICD-10-CM

## 2016-12-29 DIAGNOSIS — R251 Tremor, unspecified: Secondary | ICD-10-CM | POA: Diagnosis not present

## 2016-12-29 DIAGNOSIS — R29898 Other symptoms and signs involving the musculoskeletal system: Secondary | ICD-10-CM

## 2016-12-29 DIAGNOSIS — R269 Unspecified abnormalities of gait and mobility: Secondary | ICD-10-CM

## 2016-12-29 DIAGNOSIS — F39 Unspecified mood [affective] disorder: Secondary | ICD-10-CM

## 2016-12-29 DIAGNOSIS — R4586 Emotional lability: Secondary | ICD-10-CM

## 2016-12-29 DIAGNOSIS — R531 Weakness: Secondary | ICD-10-CM | POA: Diagnosis not present

## 2016-12-29 DIAGNOSIS — F0631 Mood disorder due to known physiological condition with depressive features: Secondary | ICD-10-CM

## 2016-12-29 LAB — LIPID PANEL
CHOLESTEROL: 156 mg/dL (ref <200)
HDL: 39 mg/dL — ABNORMAL LOW (ref >40)
LDL Cholesterol: 92 mg/dL (ref <100)
TRIGLYCERIDES: 123 mg/dL (ref <150)
Total CHOL/HDL Ratio: 4 Ratio (ref <5.0)
VLDL: 25 mg/dL (ref <30)

## 2016-12-29 LAB — TSH: TSH: 0.41 m[IU]/L (ref 0.40–4.50)

## 2016-12-29 NOTE — Progress Notes (Signed)
Subjective: Chief Complaint  Patient presents with  . Advice Only    to discuss going on bp meds and hospital follow up.    Medical Team: Endocrinology - [place], [facility] - Dr. Thomasene Ripple, DAVID [name], PA-C here for PCP Sees eye doctor, dentist, podiatry,  Dr. Havery Moros, GI Waiting on appt from pain clinic, Dr. Mirna Mires  Here with spouse. Here for emergency dept f/u and other issues.  He reports head been hurting for a few weeks, recurrent headaches.  Always seems to be in pain.   Went to the hospital last week for headache, posterior throbbing pain, like when he had stroke prior.  Was given injection of Compazine and Toradol.  Getting headache every 1-2 days, stay there for hours.   Mostly left side and posterior.  sometimes temple hurts on left.  Having some vision changes. Both eyes worse.  Last eye doctor over a year, has one scheduled 02/03/17.  Feeling numb in right hand , bilat feet.   Limited neck ROM.   Neck pain.  Was suppose to see neurology 12/22/16, but appt was canceled due to snow/weather issues.  Neurology hasn't contacted him back  Regarding pain, depression: Taking Cymbalta 53m BID. Taking Keppa 5021m 1/2 tablet BID.  The brown keppra made him feel funny, but the white keppra capsules worked better.   Taking Lyrica 10030mID  BP - Taking Lisinopril 82m51mily  Diabetes - Sugars running ok.   Had recent high of 163.     Past Medical History:  Diagnosis Date  . Acute ischemic stroke (HCC)[place]/2014  . 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 [place]  . Diabetes mellitus    DKA prior hospitalization  . Diverticulitis    s/p micorperforation Sept 2012-managed conservatively by Gen surgery  . ED (erectile dysfunction)   . Focal seizure (HCC)[place]/2014   due to ischemic stroke  . Hypertension   . Kidney tumor 09/2011   Renal cell CA  . Wears glasses    Current Outpatient  Prescriptions on File Prior to Visit  Medication Sig Dispense Refill  . aspirin 325 MG tablet Take 325 mg by mouth every other day.     . atMarland Kitchenrvastatin (LIPITOR) 40 MG tablet Take 1 tablet (40 mg total) by mouth daily. 90 tablet 3  . blood glucose meter kit and supplies Dispense based on patient and insurance preference. Use up to four times daily as directed. (FOR ICD-9 250.00, 250.01). 1 each 0  . DULoxetine (CYMBALTA) 60 MG capsule Take 1 capsule (60 mg total) by mouth 2 (two) times daily. 180 capsule 3  . Insulin Glargine (LANTUS) 100 UNIT/ML Solostar Pen Inject 45 Units into the skin daily at 10 pm. (Patient taking differently: Inject 45 Units into the skin 2 (two) times daily. ) 15 mL 3  . Insulin Pen Needle (PEN NEEDLES) 32G X 4 MM MISC 1 each by Does not apply route daily. Use for insulin pens 200 each 2  . levETIRAcetam (KEPPRA) 500 MG tablet 1/2 tablet po BID (Patient taking differently: Take 250 mg by mouth daily. ) 60 tablet 3  . lisinopril (PRINIVIL,ZESTRIL) 20 MG tablet Take 1 tablet (20 mg total) by mouth daily. 90 tablet 3  . polyethylene glycol powder (GLYCOLAX/MIRALAX) powder 1 cap full daily (Patient taking differently: Take 17 g by mouth daily. ) 3350 g 2  . pregabalin (LYRICA) 100  MG capsule Take 1 capsule (100 mg total) by mouth 2 (two) times daily. (Patient taking differently: Take 100 mg by mouth See admin instructions. One to two times a day) 60 capsule 2  . insulin aspart (NOVOLOG FLEXPEN) 100 UNIT/ML FlexPen Inject 15 Units into the skin 3 (three) times daily with meals. (Patient not taking: Reported on 12/22/2016) 15 mL 3  . Nutritional Supplements (GLUCERNA HUNGER SMART SHAKE) LIQD Take 1 Bottle by mouth 2 (two) times daily. As a meal replacement (Patient not taking: Reported on 11/04/2016) 60 Bottle 2   Current Facility-Administered Medications on File Prior to Visit  Medication Dose Route Frequency Provider Last Rate Last Dose  . 0.9 %  sodium chloride infusion  500 mL  Intravenous Continuous Manus Gunning, MD       Past Surgical History:  Procedure Laterality Date  . KIDNEY SURGERY     ablation of renal cell CA - 12/28, prior one was October 2012-Dr. Kathlene Cote  . TEE WITHOUT CARDIOVERSION N/A 11/19/2013   Procedure: TRANSESOPHAGEAL ECHOCARDIOGRAM (TEE);  Surgeon: Lelon Perla, MD;  Location: [facility] ENDOSCOPY;  Service: Cardiovascular;  Laterality: N/A;    ROS as in subjective   Objective: BP 122/90 (BP Location: Right Arm, Patient Position: Sitting, Cuff Size: Normal)   Pulse 80   Resp 18   Ht 6' (1.829 m)   Wt 281 lb (127.5 kg)   BMI 38.11 kg/m   BP Readings from Last 3 Encounters:  12/29/16 122/90  12/22/16 154/96  11/04/16 138/90   Wt Readings from Last 3 Encounters:  12/29/16 281 lb (127.5 kg)  11/04/16 276 lb 3.2 oz (125.3 kg)  10/21/16 281 lb 12.8 oz (127.8 kg)   General appearence: alert, no distress, WD/WN, pleasant Neck: quite limited ROM in all directions, somewhat tender in general to neck, no lymphadenopathy, no thyromegaly, no masses Heart: RRR, normal S1, S2, no murmurs Lungs: CTA bilaterally, no wheezes, rhonchi, or rales Pulses: 2+ symmetric, upper and lower extremities, normal cap refill Neuro: stuttering some, slow to form words at times, CN 2-12 intact otherwise, unable to do heel to toe, over points some with finger to nose, left arm generally weaker than right, but there seems to be 4-5/5 strength of UE and LE, noticeable different of left side, no appreciated DTRs    Assessment: Encounter Diagnoses  Name Primary?  . Other vascular headache Yes  . Essential hypertension   . 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 Left-sided weakness   . History of stroke   . Gait disturbance   . Headache syndrome   . Tremor   . Vision changes   . Mood disturbance ([place])   . Chronic pain syndrome   . Sleep disturbance   . Decreased ROM of neck     Plan: Headaches, hx/o stroke, neck pain and limited neck ROM - will  get him back in with neurology.  Reviewed the recent ED visit notes. Decreased neck ROM - consider xray, PT Depression - advised he establish with [facility], c/t cymbalta Still working on pain clinic referral BP - c/t same medication Advised he interview other attorneys as his current attorney for disability doesn't seem to be helping him   Brandon [name] was seen today for advice only.  Diagnoses and all orders for this visit:  Other vascular headache -     Lipid panel -     Sedimentation rate -     TSH -     Vitamin B12  Essential hypertension -     Lipid panel -     Sedimentation rate -     TSH -     Vitamin B12  Depression due to stroke (HCC) -     Lipid panel -     Sedimentation rate -     TSH -     Vitamin B12  Left-sided weakness -     Lipid panel -     Sedimentation rate -     TSH -     Vitamin B12  History of stroke -     Lipid panel -     Sedimentation rate -     TSH -     Vitamin B12  Gait disturbance -     Lipid panel -     Sedimentation rate -     TSH -     Vitamin B12  Headache syndrome -     Lipid panel -     Sedimentation rate -     TSH -     Vitamin B12  Tremor -     Lipid panel -     Sedimentation rate -     TSH -     Vitamin B12  Vision changes -     Lipid panel -     Sedimentation rate -     TSH -     Vitamin B12  Mood disturbance (HCC) -     Lipid panel -     Sedimentation rate -     TSH -     Vitamin B12  Chronic pain syndrome -     Lipid panel -     Sedimentation rate -     TSH -     Vitamin B12  Sleep disturbance  Decreased ROM of neck

## 2016-12-29 NOTE — Telephone Encounter (Signed)
Office closed. Will call 12/30/2016

## 2016-12-30 LAB — SEDIMENTATION RATE: Sed Rate: 18 mm/hr (ref 0–20)

## 2016-12-30 LAB — VITAMIN B12: VITAMIN B 12: 365 pg/mL (ref 200–1100)

## 2016-12-30 NOTE — Telephone Encounter (Signed)
LM for Brandon [name] to [facility]

## 2017-01-04 ENCOUNTER — Other Ambulatory Visit: Payer: Self-pay | Admitting: Medical

## 2017-01-04 LAB — T4, FREE: Free T4: 1.1 ng/dL (ref 0.8–1.8)

## 2017-01-05 NOTE — Telephone Encounter (Signed)
Pt needs to call Dr. Earnie Larsson office back. Brandon [name]

## 2017-01-09 ENCOUNTER — Telehealth: Payer: Self-pay | Admitting: Medical

## 2017-01-09 ENCOUNTER — Ambulatory Visit (AMBULATORY_SURGERY_CENTER): Payer: Self-pay | Admitting: *Deleted

## 2017-01-09 VITALS — Ht 72.0 in | Wt 280.0 lb

## 2017-01-09 DIAGNOSIS — Z1211 Encounter for screening for malignant neoplasm of colon: Secondary | ICD-10-CM

## 2017-01-09 NOTE — Telephone Encounter (Signed)
Pt has been scheduled with [facility] pain [facility]ults on April the 9th at 1.30  They called to let us know of the referral

## 2017-01-09 NOTE — Telephone Encounter (Signed)
Make sure patient is aware

## 2017-01-09 NOTE — Progress Notes (Signed)
No egg or soy allergy  No anesthesia or intubation problems per pt  No diet medications taken  Registered in EMMI  No home oxygen used or hx of sleep apnea  2 day Miralax/Miralax prep instructions given

## 2017-01-10 ENCOUNTER — Telehealth: Payer: Self-pay | Admitting: Medical

## 2017-01-10 NOTE — Telephone Encounter (Signed)
Pt called requesting referral to psychologist for his anxiety attacks. His court case is being reviewed and starting and pt is having increased anxiety attacks.

## 2017-01-10 NOTE — Telephone Encounter (Signed)
We gave contact info last visit for him to call, preferably [facility] is where I recommend

## 2017-01-11 NOTE — Telephone Encounter (Signed)
[facility] to have them give  The patient a call to set up an appt. Sent office to them .

## 2017-01-12 ENCOUNTER — Telehealth: Payer: Self-pay | Admitting: Medical

## 2017-01-12 NOTE — Telephone Encounter (Signed)
Pt states that the office that we referred him to for physical therapy and [facility] where we referred him will not see him because he has Medicaid.

## 2017-01-13 ENCOUNTER — Other Ambulatory Visit: Payer: Self-pay

## 2017-01-13 DIAGNOSIS — R29898 Other symptoms and signs involving the musculoskeletal system: Secondary | ICD-10-CM

## 2017-01-13 DIAGNOSIS — Z8673 Personal history of transient ischemic attack (TIA), and cerebral infarction without residual deficits: Secondary | ICD-10-CM

## 2017-01-13 NOTE — Telephone Encounter (Signed)
Called and l/m with [facility] to see if  They will  Take patient insurance, will sent p/t  Referral to [facility].

## 2017-01-17 ENCOUNTER — Other Ambulatory Visit: Payer: Self-pay

## 2017-01-17 DIAGNOSIS — F332 Major depressive disorder, recurrent severe without psychotic features: Secondary | ICD-10-CM

## 2017-01-17 NOTE — Telephone Encounter (Signed)
Sent referral to behavioral

## 2017-01-23 ENCOUNTER — Ambulatory Visit (AMBULATORY_SURGERY_CENTER): Payer: Medicaid Other | Admitting: Gastroenterology

## 2017-01-23 ENCOUNTER — Encounter: Payer: Self-pay | Admitting: Gastroenterology

## 2017-01-23 VITALS — BP 133/83 | HR 77 | Temp 96.6°F | Resp 20 | Ht 72.0 in | Wt 280.0 lb

## 2017-01-23 DIAGNOSIS — Z1212 Encounter for screening for malignant neoplasm of rectum: Secondary | ICD-10-CM | POA: Diagnosis not present

## 2017-01-23 DIAGNOSIS — Z1211 Encounter for screening for malignant neoplasm of colon: Secondary | ICD-10-CM

## 2017-01-23 MED ORDER — SODIUM CHLORIDE 0.9 % IV SOLN: 500.0000 mL | INTRAVENOUS | Status: DC

## 2017-01-23 MED ORDER — INSULIN REGULAR HUMAN 100 UNIT/ML IJ SOLN
10.0000 [IU] | Freq: Once | INTRAMUSCULAR | Status: AC
  Administered 2017-01-23: 10 [IU] via SUBCUTANEOUS

## 2017-01-23 NOTE — Patient Instructions (Signed)
YOU HAD AN ENDOSCOPIC PROCEDURE TODAY AT [facility]:   Refer to the procedure report that was given to you for any specific questions about what was found during the examination.  If the procedure report does not answer your questions, please call your gastroenterologist to clarify.  If you requested that your care partner not be given the details of your procedure findings, then the procedure report has been included in a sealed envelope for you to review at your convenience later.  YOU SHOULD EXPECT: Some feelings of bloating in the abdomen. Passage of more gas than usual.  Walking can help get rid of the air that was put into your GI tract during the procedure and reduce the bloating. If you had a lower endoscopy (such as a colonoscopy or flexible sigmoidoscopy) you may notice spotting of blood in your stool or on the toilet paper. If you underwent a bowel prep for your procedure, you may not have a normal bowel movement for a few days.  Please Note:  You might notice some irritation and congestion in your nose or some drainage.  This is from the oxygen used during your procedure.  There is no need for concern and it should clear up in a day or so.  SYMPTOMS TO REPORT IMMEDIATELY:   Following lower endoscopy (colonoscopy or flexible sigmoidoscopy):  Excessive amounts of blood in the stool  Significant tenderness or worsening of abdominal pains  Swelling of the abdomen that is new, acute  Fever of 100F or higher   For urgent or emergent issues, a gastroenterologist can be reached at any hour by calling [phone].   DIET:  We do recommend a small meal at first, but then you may proceed to your regular diet.  Drink plenty of fluids but you should avoid alcoholic beverages for 24 hours.  ACTIVITY:  You should plan to take it easy for the rest of today and you should NOT DRIVE or use heavy machinery until tomorrow (because of the sedation medicines used during the test).     FOLLOW UP: Our staff will call the number listed on your records the next business day following your procedure to check on you and address any questions or concerns that you may have regarding the information given to you following your procedure. If we do not reach you, we will leave a message.  However, if you are feeling well and you are not experiencing any problems, there is no need to return our call.  We will assume that you have returned to your regular daily activities without incident.  If any biopsies were taken you will be contacted by phone or by letter within the next 1-3 weeks.  Please call us at [phone] if you have not heard about the biopsies in 3 weeks.    SIGNATURES/CONFIDENTIALITY: You and/or your care partner have signed paperwork which will be entered into your electronic medical record.  These signatures attest to the fact that that the information above on your After Visit Summary has been reviewed and is understood.  Full responsibility of the confidentiality of this discharge information lies with you and/or your care-partner.  3RD FLOOR STAFF WILL CALL YOU TO SET UP THE COLOGARD TEST.

## 2017-01-23 NOTE — Progress Notes (Signed)
Pt's blood sugar was 531 on admission.  Pt c/o "I don't feel well, I feel dehyrated". Pt said he takes Lantus 40 units BID.  He took Lantus yesterday am 40 units. I reported to Dr. Havery Moros, he gave a vocal order for pt to have Novolin R 10 units now sq.  Recheck glucose in 30 minutes.maw  Repeat and read order back to MD by MAW, RN. Corky Sing  I also notified Barrie Folk, CRNA. maw

## 2017-01-23 NOTE — Op Note (Signed)
[place] Patient Name: Brandon [name] Procedure Date: 01/23/2017 11:14 AM MRN: [id number] Endoscopist: Remo Lipps P. Osha Errico MD, MD Age: 56 Referring MD:  Date of Birth: 05-25-1961 Gender: Male Account #: [id number] Procedure:                Colonoscopy Indications:              Screening for colorectal malignant neoplasm, last                            exam in October limited by poor prep. 2 day prep                            recommended versus stool testing. Medicines:                Monitored Anesthesia Care Procedure:                Pre-Anesthesia Assessment:                           - Prior to the procedure, a History and Physical                            was performed, and patient medications and                            allergies were reviewed. The patient's tolerance of                            previous anesthesia was also reviewed. The risks                            and benefits of the procedure and the sedation                            options and risks were discussed with the patient.                            All questions were answered, and informed consent                            was obtained. Prior Anticoagulants: The patient has                            taken aspirin, last dose was 1 day prior to                            procedure. ASA Grade Assessment: III - A patient                            with severe systemic disease. After reviewing the                            risks and benefits, the patient was deemed in  satisfactory condition to undergo the procedure.                           After obtaining informed consent, the colonoscope                            was passed under direct vision. Throughout the                            procedure, the patient's blood pressure, pulse, and                            oxygen saturations were monitored continuously. The                            Model CF-HQ190L  [id number]) scope was introduced                            through the anus with the intention of advancing to                            the cecum. The scope was advanced to the sigmoid                            colon before the procedure was aborted. Medications                            were given. The colonoscopy was performed without                            difficulty. The patient tolerated the procedure                            well. The quality of the bowel preparation was                            poor. The rectum was photographed. Scope In: Scope Out: Findings:                 The perianal and digital rectal examinations were                            normal.                           A large amount of solid stool was found in the                            rectum, in the recto-sigmoid colon and in the                            sigmoid colon, precluding visualization. The scope  could not be advanced safely and the procedure was                            aborted. Complications:            No immediate complications. Estimated blood loss:                            None. Estimated Blood Loss:     Estimated blood loss: none. Impression:               - Preparation of the colon was poor.                           - Stool in the rectum, in the recto-sigmoid colon                            and in the sigmoid colon, procedure aborted. Recommendation:           - Patient has a contact number available for                            emergencies. The signs and symptoms of potential                            delayed complications were discussed with the                            patient. Return to normal activities tomorrow.                            Written discharge instructions were provided to the                            patient.                           - Resume previous diet.                           - Continue present medications.                            - Options for future colon cancer screening include                            FIT or Cologuard testing (if no symptoms, as he has                            no anemia or FH of CRC) versus 2 day prep for                            colonoscopy (does not appear patient had 2 day prep                            for  this exam). Will discuss options with patient. Remo Lipps P. Quandra Fedorchak MD, MD 01/23/2017 11:32:50 AM This report has been signed electronically.

## 2017-01-23 NOTE — Progress Notes (Signed)
Pt's states no medical or surgical changes since previsit or office visit. Maw Pt stated last seizure was 2016.  He is taking KEPPRA.  MAW  Per Dr. Havery Moros, administer 1 liter of NS 0.9% prior to procedure.  IV wide open drip. maw  Pt has no history of heart failure.  Lucina Mellow, RN reported pt was on a two day prep.  He drank his prep on Saturday night,  reported he drank all of the miralax Sunday night.  Last results were clear green colored liquid.  maw

## 2017-01-23 NOTE — Progress Notes (Signed)
09:19 Insulin - Novolon R 10 units administered sq in right arm.  Salome Arnt, RN watched me draw up insulin and checked dosage before administered.  Will recheck glucose in 30 minutes.  maw

## 2017-01-23 NOTE — Progress Notes (Signed)
Rechecked glucose 518 by Eddie Candle, RN   Reported to Dr. Havery Moros & Barrie Folk, CRNA orders given @09 :53 saline continue wide open flow.  Will recheck pt's glucose before taken back to procedure room.the patient said his headache was gone.  "I feel better with the fluids going in".  maw

## 2017-01-23 NOTE — Progress Notes (Signed)
Checked patient's CBS and it was 508 at 11:15 am after a liter of fluids per Dr. Doyne Keel request.

## 2017-01-24 ENCOUNTER — Telehealth: Payer: Self-pay | Admitting: *Deleted

## 2017-01-24 ENCOUNTER — Telehealth: Payer: Self-pay

## 2017-01-24 NOTE — Telephone Encounter (Signed)
  Follow up Call-  Call back number 01/23/2017 09/12/2016  Post procedure Call Back phone  # [phone] cell [phone]  Permission to leave phone message Yes Yes  Some recent data might be hidden    No voicemail set up to lave a message

## 2017-01-24 NOTE — Telephone Encounter (Signed)
Unable to leave message person is not excepting calls at this time.

## 2017-01-26 ENCOUNTER — Telehealth: Payer: Self-pay

## 2017-01-26 NOTE — Telephone Encounter (Signed)
Faxed Cologuard order to [employer] at [phone]. Patient aware that he will be receiving this in the mail. Got a verbal okay from patient to sign form.

## 2017-01-27 ENCOUNTER — Encounter: Payer: Self-pay | Admitting: Internal Medicine

## 2017-02-02 ENCOUNTER — Encounter: Payer: Self-pay | Admitting: Neurology

## 2017-02-02 ENCOUNTER — Ambulatory Visit (INDEPENDENT_AMBULATORY_CARE_PROVIDER_SITE_OTHER): Payer: Medicaid Other | Admitting: Neurology

## 2017-02-02 VITALS — BP 139/88 | HR 83 | Wt 269.0 lb

## 2017-02-02 DIAGNOSIS — R51 Headache: Secondary | ICD-10-CM | POA: Diagnosis not present

## 2017-02-02 DIAGNOSIS — G4733 Obstructive sleep apnea (adult) (pediatric): Secondary | ICD-10-CM | POA: Diagnosis not present

## 2017-02-02 DIAGNOSIS — R519 Headache, unspecified: Secondary | ICD-10-CM

## 2017-02-02 MED ORDER — TOPIRAMATE 50 MG PO TABS
50.0000 mg | ORAL_TABLET | Freq: Two times a day (BID) | ORAL | 2 refills | Status: DC
Start: 2017-02-02 — End: 2017-08-17

## 2017-02-02 NOTE — Patient Instructions (Addendum)
I had a long discussion with the patient and his wife regarding his almost daily headaches which appear to be mixed tension headaches with vascular features. I recommend trial of Topamax 50 mg daily for 1 week increase if tolerated to twice daily. I have discussed possible side effects with the patient and advised him to call me. Discontinue Cymbalta as patient is also taking Lyrica for neuropathic pain in Topamax made help instead. Continue Keppra in the current dose for seizures. Continue aspirin for stroke prevention and maintain strict control of lipids with LDL cholesterol goal below 70 mg percent, diabetes with hemoglobin A1c goal below 6.5 and blood pressure goal below 130/90. Check polysomnogram for sleep apnea. Return for follow-up in 3 months with Meghan my nurse practitioner call earlier if necessary
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
## 2017-02-08 ENCOUNTER — Ambulatory Visit (INDEPENDENT_AMBULATORY_CARE_PROVIDER_SITE_OTHER): Payer: Medicaid Other | Admitting: Neurology

## 2017-02-08 ENCOUNTER — Encounter: Payer: Self-pay | Admitting: Neurology

## 2017-02-08 VITALS — BP 150/78 | HR 88 | Resp 20 | Ht 72.0 in | Wt 248.0 lb

## 2017-02-08 DIAGNOSIS — R351 Nocturia: Secondary | ICD-10-CM | POA: Diagnosis not present

## 2017-02-08 DIAGNOSIS — R0683 Snoring: Secondary | ICD-10-CM

## 2017-02-08 DIAGNOSIS — Z9189 Other specified personal risk factors, not elsewhere classified: Secondary | ICD-10-CM | POA: Diagnosis not present

## 2017-02-08 DIAGNOSIS — R51 Headache: Secondary | ICD-10-CM | POA: Diagnosis not present

## 2017-02-08 DIAGNOSIS — Z789 Other specified health status: Secondary | ICD-10-CM

## 2017-02-08 DIAGNOSIS — R519 Headache, unspecified: Secondary | ICD-10-CM

## 2017-02-08 DIAGNOSIS — Z8673 Personal history of transient ischemic attack (TIA), and cerebral infarction without residual deficits: Secondary | ICD-10-CM

## 2017-02-08 DIAGNOSIS — E669 Obesity, unspecified: Secondary | ICD-10-CM

## 2017-02-08 NOTE — Patient Instructions (Signed)

## 2017-02-08 NOTE — Progress Notes (Signed)
Subjective:    Patient ID: Brandon [name] is a 56 y.o. male.  HPI     Star Age, MD, PhD [facility] [address], [address] P.O. [place], [place] [address]  Dear Mamie Nick,   I saw your patient, Brandon [name], upon your kind request in my clinic today for initial consultation of his sleep disorder, in particular, concern for underlying obstructive sleep apnea. The patient is unaccompanied today. As you know, Brandon [name] is a 56 year old right-handed gentleman with an underlying medical history of ischemic stroke in December 2014 of the right MCA territory with mild residual left-sided weakness, anxiety, depression, diabetes, diverticulitis, ED, history of focal seizure, hyperlipidemia, hypertension, renal cell cancer, thyroid disease, and obesity, who reports snoring and excessive daytime somnolence as well as witnessed apneic pauses while asleep per family he lives with. I reviewed your office note from 02/02/2017. His Epworth sleepiness score is 6 out of 24, fatigue score is 45 out of 63. He reports difficulty sleeping, sometimes he goes days without adequate sleep, he does not always keep a schedule though. He tries to be in bed between 10 and 11 and is often awake by 4:05 AM. He has nocturia several times per average night, averaging about 5 times per night, has occasional morning headaches, reports drinking 2 cups of coffee per week on average but 5 sodas per day on average. He has 2 grown children. He drinks alcohol occasionally, about twice per month. Used to smoke marijuana when in high school. He has woken up with a sense of gasping for air. He has lost weight.   His Past Medical History Is Significant For: Past Medical History:  Diagnosis Date  . Acute ischemic stroke ([place]) 11/2013  . Anxiety   . Arthritis   . 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 Depression    BH Adm in [place] Depression  . Diabetes  mellitus    DKA prior hospitalization  . Diverticulitis    s/p micorperforation Sept 2012-managed conservatively by Gen surgery  . ED (erectile dysfunction)   . Focal seizure ([place]) 11/2013   due to ischemic stroke  . Hyperlipidemia   . Hypertension   . Kidney tumor 09/2011   Renal cell CA  . Seizures ([place])    none since 2016, taking Keppra - maw  . Thyroid disease    "weak thyroid" per MD  . Wears glasses     His Past Surgical History Is Significant For: Past Surgical History:  Procedure Laterality Date  . COLONOSCOPY    . KIDNEY SURGERY     ablation of renal cell CA - 12/28, prior one was October 2012-Dr. Kathlene Cote  . TEE WITHOUT CARDIOVERSION N/A 11/19/2013   Procedure: TRANSESOPHAGEAL ECHOCARDIOGRAM (TEE);  Surgeon: Lelon Perla, MD;  Location: [facility] ENDOSCOPY;  Service: Cardiovascular;  Laterality: N/A;    His Family History Is Significant For: Family History  Problem Relation Age of Onset  . Diabetes Father   . Prostate cancer Other   . Stomach cancer Other   . Colon cancer Neg Hx   . Esophageal cancer Neg Hx   . Rectal cancer Neg Hx     His Social History Is Significant For: Social History   Social History  . Marital status: Single    Spouse name: N/A  . Number of children: 2  . Years of education: college   Occupational History  .  Unemployed   Social  History Main Topics  . Smoking status: Never Smoker  . Smokeless tobacco: Never Used  . Alcohol use 1.2 oz/week    2 Cans of beer per week     Comment: one weekly  . Drug use: No  . Sexual activity: No   Other Topics Concern  . None   Social History Narrative   Patient lives at home with his family.   Education. Two years of college.   Not working.   Right handed.   Caffeine one soda daily. Drinks coffee     His Allergies Are:  Allergies  Allergen Reactions  . Morphine Itching  :   His Current Medications Are:  Outpatient Encounter Prescriptions as of 02/08/2017  Medication Sig  .  aspirin 325 MG tablet Take 325 mg by mouth every other day.   [name] aspirin 325 MG tablet Take by mouth.  [name] atorvastatin (LIPITOR) 40 MG tablet Take 1 tablet (40 mg total) by mouth daily.  . blood glucose meter kit and supplies Dispense based on patient and insurance preference. Use up to four times daily as directed. (FOR ICD-9 250.00, 250.01).  . Insulin Glargine (LANTUS) 100 UNIT/ML Solostar Pen Inject 45 Units into the skin daily at 10 pm. (Patient taking differently: Inject 45 Units into the skin 2 (two) times daily. )  . Insulin Pen Needle (PEN NEEDLES) 32G X 4 MM MISC 1 each by Does not apply route daily. Use for insulin pens  . levETIRAcetam (KEPPRA) 500 MG tablet 1/2 tablet po BID (Patient taking differently: Take 250 mg by mouth daily. )  . lisinopril (PRINIVIL,ZESTRIL) 20 MG tablet Take 1 tablet (20 mg total) by mouth daily.  . polyethylene glycol powder (GLYCOLAX/MIRALAX) powder 1 cap full daily (Patient taking differently: Take 17 g by mouth daily. )  . pregabalin (LYRICA) 100 MG capsule Take 1 capsule (100 mg total) by mouth 2 (two) times daily. (Patient taking differently: Take 100 mg by mouth See admin instructions. One to two times a day)  . topiramate (TOPAMAX) 50 MG tablet Take 1 tablet (50 mg total) by mouth 2 (two) times daily.   No facility-administered encounter medications on file as of 02/08/2017.   :  Review of Systems:  Out of a complete 14 point review of systems, all are reviewed and negative with the exception of these symptoms as listed below: Review of Systems  Neurological:       Patient has trouble falling and staying asleep, snores, wakes up choking or short of breath, wakes up feeling tired, daytime fatigue, denies taking naps.  Irregular sleep pattern, states he may not sleep for a long period of time but other times will sleep for 3 days.   Epworth Sleepiness Scale 0= would never doze 1= slight chance of dozing 2= moderate chance of dozing 3= high chance of  dozing  Sitting and reading:0 Watching TV:1 Sitting inactive in a public place (ex. Theater or meeting):2 As a passenger in a car for an hour without a break:3 Lying down to rest in the afternoon:0 Sitting and talking to someone:0 Sitting quietly after lunch (no alcohol):0 In a car, while stopped in traffic:0 Total:6  Objective:  Neurologic Exam  Physical Exam Physical Examination:   Vitals:   02/08/17 1605  BP: (!) 150/78  Pulse: 88  Resp: 20    General Examination: The patient is a very pleasant 56 y.o. male in no acute distress. He appears well-developed and well-nourished and adequately groomed.   HEENT: Normocephalic,  atraumatic, pupils are equal, round and reactive to light and accommodation. Extraocular tracking is good without limitation to gaze excursion or nystagmus noted. Normal smooth pursuit is noted. Hearing is grossly intact. Face is symmetric with normal facial animation and normal facial sensation. Speech is clear but has intermittent stutter, tendency to exchange Ts for Ks. There is no lip, neck/head, jaw or voice tremor. Neck is supple with full range of passive and active motion. There are no carotid bruits on auscultation. Oropharynx exam reveals: moderate mouth dryness, adequate dental hygiene and marked airway crowding, due to large tongue, tonsils 2+, large uvula. Mallampati is class III. Tongue protrudes centrally and palate elevates symmetrically. Neck size is 17 3/8 inches. He has a Mild overbite.   Chest: Clear to auscultation without wheezing, rhonchi or crackles noted.  Heart: S1+S2+0, regular and normal without murmurs, rubs or gallops noted.   Abdomen: Soft, non-tender and non-distended with normal bowel sounds appreciated on auscultation.  Extremities: There is trace pitting edema in the distal lower extremities bilaterally. Pedal pulses are intact.  Skin: Warm and dry without trophic changes noted.  Musculoskeletal: exam reveals no obvious  joint deformities, tenderness or joint swelling or erythema.   Neurologically:  Mental status: The patient is awake, alert and oriented in all 4 spheres. His immediate and remote memory, attention, language skills and fund of knowledge are appropriate. There is no evidence of aphasia, agnosia, apraxia or anomia. Speech is clear with normal prosody and enunciation. Thought process is linear. Mood is normal and affect is normal.  Cranial nerves II - XII are as described above under HEENT exam. In addition: shoulder shrug is normal with equal shoulder height noted. Motor exam: Normal bulk, strength and tone is noted. There is no drift, tremor or rebound. Romberg is negative. Reflexes are 1+ throughout. Fine motor skills and coordination: intact on R, deliberate on L.  Cerebellar testing: No dysmetria or intention tremor on finger to nose testing. Heel to shin is unremarkable on R and difficult on the L, appears embellished.   Sensory exam: intact to light touch in the upper and lower extremities.  Gait, station and balance: He stands with difficulty. No veering to one side is noted. No leaning to one side is noted. Posture is age-appropriate and stance is narrow based. Gait shows widebased and awkward sliding gait, no limp per se, cannot do tandem walk and makes flapping movements with LUE while attempting tandem, later noted to have mild limp.   Assessment and Plan:   In summary, Brandon [name] is a very pleasant 57 y.o.-year old male with an underlying medical history of ischemic stroke in December 2014 of the right MCA territory with mild residual left-sided weakness, anxiety, depression, diabetes, diverticulitis, ED, history of focal seizure, hyperlipidemia, hypertension, renal cell cancer, thyroid disease, and obesity, whose history and physical exam concerning for obstructive sleep apnea (OSA). I had a long chat with the patient about my findings and the diagnosis of OSA, its prognosis and  treatment options. We talked about medical treatments, surgical interventions and non-pharmacological approaches. I explained in particular the risks and ramifications of untreated moderate to severe OSA, especially with respect to developing cardiovascular disease down the Road, including congestive heart failure, difficult to treat hypertension, cardiac arrhythmias, or stroke. Even type 2 diabetes has, in part, been linked to untreated OSA. Symptoms of untreated OSA include daytime sleepiness, memory problems, mood irritability and mood disorder such as depression and anxiety, lack of energy, as well as  recurrent headaches, especially morning headaches. We talked about trying to maintain a healthy lifestyle in general, as well as the importance of weight control. I encouraged the patient to eat healthy, exercise daily and keep well hydrated, to keep a scheduled bedtime and wake time routine, to not skip any meals and eat healthy snacks in between meals. I advised the patient not to drive when feeling sleepy. I recommended the following at this time: sleep study with potential positive airway pressure titration. (We will score hypopneas at 4%).   I explained the sleep test procedure to the patient and also outlined possible surgical and non-surgical treatment options of OSA, including the use of a custom-made dental device (which would require a referral to a specialist dentist or oral surgeon), upper airway surgical options, such as pillar implants, radiofrequency surgery, tongue base surgery, and UPPP (which would involve a referral to an ENT surgeon). Rarely, jaw surgery such as mandibular advancement may be considered.  I also explained the CPAP treatment option to the patient, who indicated that he would be willing to try CPAP if the need arises. I explained the importance of being compliant with PAP treatment, not only for insurance purposes but primarily to improve His symptoms, and for the patient's  long term health benefit, including to reduce His cardiovascular risks. I answered all hi questions today and the patient was in agreement. I would like to see him back after the sleep study is completed and encouraged him to call with any interim questions, concerns, problems or updates.   Thank you very much for allowing me to participate in the care of this nice patient. If I can be of any further assistance to you please do not hesitate to talk to me.  Sincerely,   Star Age, MD, PhD

## 2017-02-13 ENCOUNTER — Ambulatory Visit: Payer: Medicaid Other | Admitting: Medical

## 2017-02-15 ENCOUNTER — Telehealth: Payer: Self-pay

## 2017-02-15 NOTE — Telephone Encounter (Signed)
Send/call them out and order in Epic

## 2017-02-15 NOTE — Telephone Encounter (Signed)
Pt needs refills of Miralax and Lyrica to [facility] in [place].

## 2017-02-16 ENCOUNTER — Other Ambulatory Visit: Payer: Self-pay

## 2017-02-16 MED ORDER — POLYETHYLENE GLYCOL 3350 17 GM/SCOOP PO POWD: ORAL | 2 refills | Status: DC

## 2017-02-16 NOTE — Telephone Encounter (Signed)
Sent to [facility]

## 2017-02-20 ENCOUNTER — Telehealth: Payer: Self-pay | Admitting: Family Medicine

## 2017-02-20 DIAGNOSIS — Z0279 Encounter for issue of other medical certificate: Secondary | ICD-10-CM

## 2017-02-20 NOTE — Telephone Encounter (Signed)
[facility] pharm req refill for Lyrica and has questions for Polyethylene Glycol

## 2017-02-20 NOTE — Telephone Encounter (Signed)
Sent refills to pharmacy

## 2017-02-22 ENCOUNTER — Other Ambulatory Visit (HOSPITAL_COMMUNITY): Payer: Self-pay | Admitting: Interventional Radiology

## 2017-02-22 DIAGNOSIS — N2889 Other specified disorders of kidney and ureter: Secondary | ICD-10-CM

## 2017-02-23 ENCOUNTER — Ambulatory Visit (INDEPENDENT_AMBULATORY_CARE_PROVIDER_SITE_OTHER): Payer: Medicaid Other | Admitting: Neurology

## 2017-02-23 ENCOUNTER — Other Ambulatory Visit: Payer: Self-pay

## 2017-02-23 ENCOUNTER — Encounter: Payer: Self-pay | Admitting: Gastroenterology

## 2017-02-23 DIAGNOSIS — G4733 Obstructive sleep apnea (adult) (pediatric): Secondary | ICD-10-CM | POA: Diagnosis not present

## 2017-02-23 DIAGNOSIS — G4761 Periodic limb movement disorder: Secondary | ICD-10-CM

## 2017-02-23 DIAGNOSIS — G4720 Circadian rhythm sleep disorder, unspecified type: Secondary | ICD-10-CM

## 2017-02-23 LAB — COLOGUARD: Cologuard: NEGATIVE

## 2017-02-27 NOTE — Progress Notes (Signed)
Patient referred by Dr. Leonie Man, seen by me on 02/08/17, diagnostic PSG on 02/23/17.    Please call and notify the patient that the recent sleep study did confirm the diagnosis of significant obstructive sleep apnea and that I recommend treatment for this in the form of CPAP. This will require a repeat sleep study for proper titration and mask fitting. Please explain to patient and arrange for a CPAP titration study. I have placed an order in the chart. Thanks, and please route to [facility] for scheduling next sleep study.  Star Age, MD, PhD [facility])

## 2017-02-27 NOTE — Addendum Note (Signed)
Addended by: Star Age on: 02/27/2017 08:43 AM   Modules accepted: Orders

## 2017-02-27 NOTE — Procedures (Signed)
PATIENT'S NAME:  Brandon [name], Brandon [name] DOB:      1961/03/15      MR#:    [id number]     DATE OF RECORDING: 02/23/2017 REFERRING M.D.:  Chana Bode, PA Study Performed:   Baseline Polysomnogram HISTORY: 56 year old man with a history of R MCE ischemic stroke in December 2014, anxiety, depression, diabetes, diverticulitis, ED, history of focal seizure, hyperlipidemia, hypertension, renal cell cancer, thyroid disease, and obesity, who reports snoring and excessive daytime somnolence as well as witnessed apneic pauses while asleep per family he lives with. The patient endorsed the Epworth Sleepiness Scale at 6 points.   The patient's weight 248 pounds with a height of 72 (inches), resulting in a BMI of 33.4 kg/m2. The patient's neck circumference measured 17.4 inches.  CURRENT MEDICATIONS: Aspirin, Lipitor, Lantus, Keppra, Zestril, Lyrica, Topomax, Miralax   PROCEDURE:  This is a multichannel digital polysomnogram utilizing the Somnostar 11.2 system.  Electrodes and sensors were applied and monitored per AASM Specifications.   EEG, EOG, Chin and Limb EMG, were sampled at 200 Hz.  ECG, Snore and Nasal Pressure, Thermal Airflow, Respiratory Effort, CPAP Flow and Pressure, Oximetry was sampled at 50 Hz. Digital video and audio were recorded.      BASELINE STUDY  Lights Out was at 00:05 and Lights On at 06:34.  Total recording time (TRT) was 389.5 minutes, with a total sleep time (TST) of  364.5 minutes.   The patient's sleep latency was 17 minutes.  REM was absent. The sleep efficiency was 93.6 %.     SLEEP ARCHITECTURE: WASO (Wake after sleep onset) was 23.5 minutes with mild sleep fragmentation noted.  There were 11.5 minutes in Stage N1, 351.5 minutes Stage N2, 1.5 minutes Stage N3 and 0 minutes in Stage REM.  The percentage of Stage N1 was 3.2%, Stage N2 was 96.4%, which was markedly increased, Stage N3 was near absent at .4% and Stage R (REM sleep) was absent.   The arousals were noted as: 74 were  spontaneous, 2 were associated with PLMs, 99 were associated with respiratory events. Audio and video analysis did not show any abnormal or unusual movements, behaviors, phonations or vocalizations.   The patient took no bathroom breaks. Moderate to, at times, loud snoring was noted.  The EKG was in keeping with normal sinus rhythm (NSR).   RESPIRATORY ANALYSIS:  There were a total of 178 respiratory events:  102 obstructive apneas, 0 central apneas and 1 mixed apneas with a total of 103 apneas and an apnea index (AI) of 17. /hour. There were 75 hypopneas with a hypopnea index of 12.3 /hour. The patient also had 0 respiratory event related arousals (RERAs).      The total APNEA/HYPOPNEA INDEX (AHI) was 29.3/hour and the total RESPIRATORY DISTURBANCE INDEX was 29.3 /hour.  0 events occurred in REM sleep and 151 events in NREM. The REM AHI was n/a, versus a non-REM AHI of 29.3. The patient spent 364.5 minutes of total sleep time in the supine position and 0 minutes in non-supine.. The supine AHI was 29.3 versus a non-supine AHI of n/a.  OXYGEN SATURATION & C02:  The Wake baseline 02 saturation was 95%, with the lowest being 79%. Time spent below 89% saturation equaled 4 minutes.  PERIODIC LIMB MOVEMENTS: The patient had a total of 100 Periodic Limb Movements.  The Periodic Limb Movement (PLM) index was 16.5 and the PLM Arousal index was .3/hour. Post-study, the patient indicated that sleep was the same as usual.   IMPRESSION:  1. Obstructive Sleep Apnea(OSA) 2. Periodic Limb Movement Disorder (PLMD) 3. Dysfunctions associated with sleep stages or arousal from sleep  RECOMMENDATIONS: 1. This overnight polysomnogram demonstrates overall moderate/near-severe obstructive sleep apnea. Please note, the absence of REM sleep during this study likely underestimates the AHI and O2 nadir. Given the underlying medical history, especially stroke at a young age, treatment with positive airway pressure (in the form  of CPAP) is recommended and is achieved during a full night CPAP titration study for proper treatment settings and mask fitting. Generally speaking, other treatment options for OSA may include: avoidance of the supine sleep position, weight loss, an oral appliance (aka dental device, custom made by a specialized dentist usually), or upper airway or jaw surgery (not usually first line treatments).  2. Please note that untreated obstructive sleep apnea carries additional perioperative morbidity. Patients with significant obstructive sleep apnea should receive perioperative PAP therapy and the surgeons and particularly the anesthesiologist should be informed of the diagnosis and the severity of the sleep disordered breathing. 3. Mild PLMs (periodic limb movements of sleep) were noted during study without significant arousals; clinical correlation is recommended.  4. This study shows sleep fragmentation and abnormal sleep stage percentages; these are nonspecific findings and per se do not signify an intrinsic sleep disorder or a cause for the patient's sleep-related symptoms. Causes include (but are not limited to) the first night effect of the sleep study, circadian rhythm disturbances, medication effect or an underlying mood disorder or medical problem.  5. The patient should be cautioned not to drive, work at heights, or operate dangerous or heavy equipment when tired or sleepy. Review and reiteration of good sleep hygiene measures should be pursued with any patient. 6. The patient will be seen in follow-up by Dr. Rexene Alberts at [facility] for discussion of the test results and further management strategies. The referring provider will be notified of the test results.  I certify that I have reviewed the entire raw data recording prior to the issuance of this report in accordance with the Standards of Accreditation of the American Academy of Sleep Medicine (AASM)  Star Age, MD, PhD Diplomat, American Board of  Psychiatry and Neurology (Neurology and Sleep Medicine)

## 2017-02-28 ENCOUNTER — Encounter: Payer: Self-pay | Admitting: Radiology

## 2017-02-28 ENCOUNTER — Other Ambulatory Visit: Payer: Self-pay | Admitting: Radiology

## 2017-02-28 DIAGNOSIS — N2889 Other specified disorders of kidney and ureter: Secondary | ICD-10-CM

## 2017-03-02 ENCOUNTER — Telehealth: Payer: Self-pay

## 2017-03-02 ENCOUNTER — Telehealth: Payer: Self-pay | Admitting: Neurology

## 2017-03-02 NOTE — Telephone Encounter (Signed)
Rn call patient back about having headaches for 3 days. Pt stated he is taking the topamax one tablet twice a day. He states the medication is helping and the blood pressure has been good. Pt stated he can feel the headache coming on now. RN stated a message will be sent to Dr. Leonie Man.

## 2017-03-02 NOTE — Telephone Encounter (Signed)
Rn call patient to tell him about increasing his topamax. Rn stated per Dr. Leonie Man note below to take 50 mg in the am and 100 mg at night. Pt verbalized understanding.

## 2017-03-02 NOTE — Telephone Encounter (Signed)
-----   Message from Star Age, MD sent at 02/27/2017  8:43 AM EDT ----- Patient referred by Dr. Leonie Man, seen by me on 02/08/17, diagnostic PSG on 02/23/17.    Please call and notify the patient that the recent sleep study did confirm the diagnosis of significant obstructive sleep apnea and that I recommend treatment for this in the form of CPAP. This will require a repeat sleep study for proper titration and mask fitting. Please explain to patient and arrange for a CPAP titration study. I have placed an order in the chart. Thanks, and please route to [facility] for scheduling next sleep study.  Star Age, MD, PhD [facility])

## 2017-03-02 NOTE — Telephone Encounter (Signed)
Increase topamax to 50 mg in am and 100 mg at night

## 2017-03-02 NOTE — Telephone Encounter (Signed)
I spoke to patient and he is aware of results and recommendations. He is willing to proceed with titration study. I will send copy of report to PCP.  

## 2017-03-02 NOTE — Telephone Encounter (Signed)
Pt called says the HA's have gotten worse over the past 3 days. All of the head hurts but more so on the left side. He has checked BP it was 130/88.

## 2017-03-09 ENCOUNTER — Ambulatory Visit: Payer: Medicaid Other | Admitting: Physical Therapy

## 2017-03-13 ENCOUNTER — Ambulatory Visit: Payer: Medicaid Other | Attending: Medical

## 2017-03-13 ENCOUNTER — Encounter: Payer: Self-pay | Admitting: Physical Therapy

## 2017-03-13 DIAGNOSIS — M542 Cervicalgia: Secondary | ICD-10-CM | POA: Diagnosis present

## 2017-03-13 DIAGNOSIS — M436 Torticollis: Secondary | ICD-10-CM | POA: Diagnosis present

## 2017-03-13 DIAGNOSIS — R252 Cramp and spasm: Secondary | ICD-10-CM | POA: Diagnosis present

## 2017-03-13 NOTE — Therapy (Signed)
[place], [place], [address] Phone: [phone]   Fax:  [phone]  Physical Therapy Evaluation/ Discharge  Patient Details  Name: Brandon [name] MRN: [id number] Date of Birth: 08/12/1961 Referring Provider: Chana Bode PA  Encounter Date: 03/13/2017      PT End of Session - 03/13/17 1448    Visit Number 1   Number of Visits 1   Authorization Type Medicaid   PT Start Time 7616   PT Stop Time 1355   PT Time Calculation (min) 40 min   Activity Tolerance Patient tolerated treatment well   Behavior During Therapy [facility] for tasks assessed/performed      Past Medical History:  Diagnosis Date  . Acute ischemic stroke ([place]) 11/2013  . Anxiety   . Arthritis   . 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 Depression    BH Adm in [place] Depression  . Diabetes mellitus    DKA prior hospitalization  . Diverticulitis    s/p micorperforation Sept 2012-managed conservatively by Gen surgery  . ED (erectile dysfunction)   . Focal seizure ([place]) 11/2013   due to ischemic stroke  . Hyperlipidemia   . Hypertension   . Kidney tumor 09/2011   Renal cell CA  . Seizures ([place])    none since 2016, taking Keppra - maw  . Thyroid disease    "weak thyroid" per MD  . Wears glasses     Past Surgical History:  Procedure Laterality Date  . COLONOSCOPY    . KIDNEY SURGERY     ablation of renal cell CA - 12/28, prior one was October 2012-Dr. Kathlene Cote  . TEE WITHOUT CARDIOVERSION N/A 11/19/2013   Procedure: TRANSESOPHAGEAL ECHOCARDIOGRAM (TEE);  Surgeon: Lelon Perla, MD;  Location: [facility] ENDOSCOPY;  Service: Cardiovascular;  Laterality: N/A;    There were no vitals filed for this visit.       Subjective Assessment - 03/13/17 1330    Subjective Chronic neck pain from neck fracture  with C4/5 fracture.     2 years ago.   Prior to this he had neck pain  with CVA.  Pain worse  since incident.    How long can you sit comfortably? NA   How long can you stand comfortably? no limited by neck   How long can you walk comfortably? not limited  by neck   Patient Stated Goals Decr pain   Currently in Pain? Yes   Pain Score 6    Pain Location Neck  lower cervical worse   Pain Orientation Right;Left;Posterior   Pain Descriptors / Indicators Aching   Pain Type Chronic pain   Pain Onset More than a month ago   Pain Frequency Constant   Aggravating Factors  acvtivity   Multiple Pain Sites --  He reports other areas of pain but not addressed            [facility] PT Assessment - 03/13/17 0001      Assessment   Medical Diagnosis Cervical stiffness   Referring Provider Chana Bode PA   Onset Date/Surgical Date --  5-10 years ago   Next MD Visit 03/16/17   Prior Therapy no     Precautions   Precautions None     Restrictions   Weight Bearing Restrictions No     Balance Screen   Has the patient fallen in the past 6 months Yes   How many times?  2  Lt knee gave way   Has the patient had a decrease in activity level because of a fear of falling?  Yes  Lt knee pain and CVA   Is the patient reluctant to leave their home because of a fear of falling?  No     Prior Function   Level of Independence Independent     Cognition   Overall Cognitive Status Within Functional Limits for tasks assessed     Posture/Postural Control   Posture Comments forward head, increased throacic flexion     ROM / Strength   AROM / PROM / Strength AROM;Strength     AROM   Overall AROM Comments thoracic rotation R 10 degrees LT 5 .   He is unable or reluctant to move any direction   AROM Assessment Site Cervical   Cervical Flexion 5   Cervical Extension 5   Cervical - Right Side Bend 5   Cervical - Left Side Bend 0   Cervical - Right Rotation 5   Cervical - Left Rotation 5     Strength   Overall Strength Comments UE WNL but with some LT flank pain     Palpation    Palpation comment tender paraspinals and traps     Ambulation/Gait   Gait Comments wide BOS decr stride length                           PT Education - 03/13/17 1447    Education provided Yes   Education Details Posture and ROM exercises   Person(s) Educated Patient   Methods Explanation;Demonstration;Tactile cues;Verbal cues;Handout   Comprehension Returned demonstration;Verbalized understanding                    Plan - 03/13/17 1449    Clinical Impression Statement Brandon [name] presents for  low comlexity eval with neck pain and stiffness with spasm and abnormal posture affecting function.    PT Frequency One time visit   PT Treatment/Interventions Patient/family education;Therapeutic exercise   PT Next Visit Plan no follow up due to pt deciding not to come self pay as [place] not pay for treatment with his diagnosis   PT Home Exercise Plan Psoture and ROM       Patient will benefit from skilled therapeutic intervention in order to improve the following deficits and impairments:  Decreased activity tolerance, Decreased range of motion, Decreased strength, Postural dysfunction, Pain, Increased muscle spasms  Visit Diagnosis: Cervicalgia - Plan: PT plan of care cert/re-cert  Stiffness of neck - Plan: PT plan of care cert/re-cert  Cramp and spasm - Plan: PT plan of care cert/re-cert     Problem List Patient Active Problem List   Diagnosis Date Noted  . Decreased ROM of neck 12/30/2016  . Sleep disturbance 12/29/2016  . Mood disturbance ([place]) 12/29/2016  . Vision changes 12/29/2016  . Tremor 12/29/2016  . Late effect of stroke 11/04/2016  . Constipation 11/04/2016  . Microscopic hematuria 11/04/2016  . Gait disturbance 11/04/2016  . Financial difficulty 10/21/2016  . Acute right-sided thoracic back pain 10/21/2016  . Testicular discomfort 10/21/2016  . Urinary retention 10/21/2016  . Knee joint laxity 08/15/2016  . Fall  08/15/2016  . Special screening for malignant neoplasms, colon 08/15/2016  . Vaccine counseling 08/15/2016  . Left-sided weakness 08/01/2016  . Knee injury 08/01/2016  . Left knee pain 08/01/2016  . Diabetic ulcer of left foot associated with secondary diabetes  mellitus ([place]) 04/21/2016  . AKI (acute kidney injury) ([place]) 03/03/2016  . Severe episode of recurrent major depressive disorder, without psychotic features ([place])   . History of seizure 01/05/2016  . History of stroke 01/05/2016  . Essential hypertension 01/05/2016  . Numbness 03/20/2015  . Headache syndrome 03/20/2015  . 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 Acute ischemic stroke ([place]) 11/16/2013  . Chronic pain syndrome 06/27/2013  . Renal cell carcinoma ([place]) 03/15/2012  . Essential hypertension, benign 03/15/2012  . Renal cell cancer ([place]) 12/30/2011  . Diverticulitis-history of 12/30/2011  . Abdominal pain 12/30/2011    Darrel Hoover  PT 03/13/2017, 2:53 PM  [facility] [address] [place], [place], [address] Phone: [phone]   Fax:  [phone]  Name: Brandon [name] MRN: [id number] Date of Birth: [birth year] PHYSICAL THERAPY DISCHARGE SUMMARY  Visits from Start of Care: Eval only  Current functional level related to goals / functional outcomes: See above   Remaining deficits: See above   Education / Equipment: HEP Plan: Patient agrees to discharge.  Patient goals were not met. Patient is being discharged due to financial reasons.  ?????

## 2017-03-13 NOTE — Patient Instructions (Signed)
Issued from cabinet cervical ROM and cross postural syndrome and thoracic rotation stretches RT/LT 3-5 reps multiple times per day with looking to use eye movements to facilitate movement

## 2017-03-16 ENCOUNTER — Encounter: Payer: Self-pay | Admitting: Medical

## 2017-03-16 ENCOUNTER — Telehealth: Payer: Self-pay | Admitting: Medical

## 2017-03-16 ENCOUNTER — Ambulatory Visit (INDEPENDENT_AMBULATORY_CARE_PROVIDER_SITE_OTHER): Payer: Medicaid Other | Admitting: Medical

## 2017-03-16 VITALS — BP 130/92 | HR 78 | Wt 281.4 lb

## 2017-03-16 DIAGNOSIS — G8929 Other chronic pain: Secondary | ICD-10-CM

## 2017-03-16 DIAGNOSIS — R112 Nausea with vomiting, unspecified: Secondary | ICD-10-CM | POA: Diagnosis not present

## 2017-03-16 DIAGNOSIS — F339 Major depressive disorder, recurrent, unspecified: Secondary | ICD-10-CM

## 2017-03-16 DIAGNOSIS — E119 Type 2 diabetes mellitus without complications: Secondary | ICD-10-CM | POA: Diagnosis not present

## 2017-03-16 DIAGNOSIS — M25562 Pain in left knee: Principal | ICD-10-CM

## 2017-03-16 DIAGNOSIS — Z794 Long term (current) use of insulin: Secondary | ICD-10-CM | POA: Diagnosis not present

## 2017-03-16 DIAGNOSIS — IMO0001 Reserved for inherently not codable concepts without codable children: Secondary | ICD-10-CM

## 2017-03-16 DIAGNOSIS — R519 Headache, unspecified: Secondary | ICD-10-CM

## 2017-03-16 DIAGNOSIS — R51 Headache: Secondary | ICD-10-CM

## 2017-03-16 MED ORDER — ONDANSETRON HCL 4 MG PO TABS: 4.0000 mg | ORAL_TABLET | Freq: Three times a day (TID) | ORAL | 0 refills | Status: DC | PRN

## 2017-03-16 NOTE — Progress Notes (Signed)
Subjective: Chief Complaint  Patient presents with  . headaches , vomitting    headaches, vomitting, knee pain swelling    Here alone today.   He reports vomiting. He notes intermittent for about a month.  Not having appetite.   Mainly seems to be here about mood.  Been more down and frustrated of late.  He notes when he had the nervous breakdown after having the incident up in [place], after seeing [facility], been a down hill battle since.  Says he needs pain medication but doesn't ask for it.  Knows it doesn't help all the mental pains.  He reports knee pain.  His girlfriend doesn't really want to be with him.  He can't survive without her given his disability case hasn't come through.   Has trouble getting psychiatry help as nobody takes medicaid.  He liked [facility] prior but they don't take his insurance.  Last time he talked to [place] and Stage manager, is working with Benjamine Mola.  Has disability hearing coming up soon.   Since last visit saw Dr. Leonie Man neurology, put on headache medication Topamax, had sleep study.      He is seeing endocrinology, last few days having sugars below 130.   Had recent adjustments in insulin.    Goes back to see Dr. Kendrick Ranch on May 1 regarding hx/o cancer f/u.  Wants referral back to ortho.  Been having ongoing problems with pain and swelling in left knee.  Using a reinforced knee brace.   No other aggravating or relieving factors. No other complaint.   Past Medical History:  Diagnosis Date  . Acute ischemic stroke ([place]) 11/2013  . Anxiety   . Arthritis   . 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 Depression    BH Adm in [place] Depression  . Diabetes mellitus    DKA prior hospitalization  . Diverticulitis    s/p micorperforation Sept 2012-managed conservatively by Gen surgery  . ED (erectile dysfunction)   . Focal seizure ([place]) 11/2013   due to ischemic stroke  . Hyperlipidemia   . Hypertension   . Kidney  tumor 09/2011   Renal cell CA  . Seizures ([place])    none since 2016, taking Keppra - maw  . Thyroid disease    "weak thyroid" per MD  . Wears glasses    Current Outpatient Prescriptions on File Prior to Visit  Medication Sig Dispense Refill  . aspirin 325 MG tablet Take by mouth.    [name] atorvastatin (LIPITOR) 40 MG tablet Take 1 tablet (40 mg total) by mouth daily. 90 tablet 3  . blood glucose meter kit and supplies Dispense based on patient and insurance preference. Use up to four times daily as directed. (FOR ICD-9 250.00, 250.01). 1 each 0  . Insulin Glargine (LANTUS) 100 UNIT/ML Solostar Pen Inject 45 Units into the skin daily at 10 pm. (Patient taking differently: Inject 45 Units into the skin 2 (two) times daily. ) 15 mL 3  . Insulin Pen Needle (PEN NEEDLES) 32G X 4 MM MISC 1 each by Does not apply route daily. Use for insulin pens 200 each 2  . levETIRAcetam (KEPPRA) 500 MG tablet 1/2 tablet po BID (Patient taking differently: Take 250 mg by mouth daily. ) 60 tablet 3  . lisinopril (PRINIVIL,ZESTRIL) 20 MG tablet Take 1 tablet (20 mg total) by mouth daily. 90 tablet 3  . polyethylene glycol powder (GLYCOLAX/MIRALAX) powder 1 cap full daily 3350  g 2  . topiramate (TOPAMAX) 50 MG tablet Take 1 tablet (50 mg total) by mouth 2 (two) times daily. 60 tablet 2   No current facility-administered medications on file prior to visit.    ROS as in subjective   Objective: BP (!) 130/92   Pulse 78   Wt 281 lb 6.4 oz (127.6 kg)   SpO2 97%   BMI 38.16 kg/m   Wt Readings from Last 3 Encounters:  03/16/17 281 lb 6.4 oz (127.6 kg)  02/08/17 248 lb (112.5 kg)  02/02/17 269 lb (122 kg)   General appearance: alert, no distress, WD/WN, seems down today Oral cavity: MMM, no lesions Neck: supple, no lymphadenopathy, no thyromegaly, no masses Heart: RRR, normal S1, S2, no murmurs Lungs: CTA bilaterally, no wheezes, rhonchi, or rales Abdomen: +bs, soft, non tender, non distended, no masses, no  hepatomegaly, no splenomegaly Pulses: 1+ symmetric, upper and lower extremities, normal cap refill Psych: down, sad, answers questions approprietly though, poor eye contact      Assessment: Encounter Diagnoses  Name Primary?  . Nausea and vomiting, intractability of vomiting not specified, unspecified vomiting type Yes  . Chronic pain of left knee   . Depression, recurrent ([place])   . Other chronic pain   . Insulin dependent diabetes mellitus ([place])   . Chronic nonintractable headache, unspecified headache type   . Morbid obesity ([place])      Plan: Nausea - likely multifactorial including depressed mood, recent tighter sugar control.    Can use Zofran, but if symptoms persist, can recheck labs or consider GI consult  Chronic left knee pain and swelling - refer back to ortho  Depression - neurology recently stopped Cymbalta.   As per last visit, advised he establish with counseling and psychiatry.    chronic pain - we have already made referral at prior visit.  Stop Lyrica given his weight gain and not seeing benefit with the medication.   C/t Topamax per neurology  Diabetes - followed by endocrinology  Obesity - need to work on diet, weight loss, exercise

## 2017-03-16 NOTE — Patient Instructions (Signed)
Counseling [facility] [address], [place], [place] [address] [phone]   [place] Clarene Reamer, therapist [phone] [address] [place], [place] [address]   Vic Ripper, therapist [phone] [address], [place], [place] [address]

## 2017-03-16 NOTE — Telephone Encounter (Signed)
Refer him back to ortho about left knee pain and swelling.   Not sure who he saw prior  Please help check to see when/where we referred to pain management?

## 2017-03-17 ENCOUNTER — Other Ambulatory Visit: Payer: Self-pay

## 2017-03-17 DIAGNOSIS — M238X2 Other internal derangements of left knee: Secondary | ICD-10-CM

## 2017-03-17 DIAGNOSIS — S8992XD Unspecified injury of left lower leg, subsequent encounter: Secondary | ICD-10-CM

## 2017-03-17 NOTE — Telephone Encounter (Signed)
Sent referral to othro. , Dr.Plummer is his pain dr.

## 2017-03-20 NOTE — Telephone Encounter (Signed)
Has he been seeing Dr. Mirna Mires?  I have no office notes, and he didn't mention he had seen anybody when we discussed his pain.

## 2017-03-20 NOTE — Telephone Encounter (Signed)
Make sure he is aware and writes this down

## 2017-03-20 NOTE — Telephone Encounter (Signed)
Pt has not been seen at their office yet, his appt  Is on 04/03/2017

## 2017-03-20 NOTE — Telephone Encounter (Signed)
Pt is aware of this.  °

## 2017-03-22 ENCOUNTER — Ambulatory Visit (INDEPENDENT_AMBULATORY_CARE_PROVIDER_SITE_OTHER): Payer: Medicaid Other | Admitting: Neurology

## 2017-03-22 DIAGNOSIS — G4733 Obstructive sleep apnea (adult) (pediatric): Secondary | ICD-10-CM | POA: Diagnosis not present

## 2017-03-23 ENCOUNTER — Telehealth: Payer: Self-pay

## 2017-03-23 NOTE — Procedures (Signed)
PATIENT'S NAME:  Brandon [name], Brandon [name] DOB:      03-22-1961      MR#:    [id number]     DATE OF RECORDING: 03/22/2017 REFERRING M.D.:  Chana Bode, PA Study Performed:   CPAP  Titration HISTORY: 56 year old man with a history of R MCE ischemic stroke in December 2014, anxiety, depression, diabetes, diverticulitis, ED, history of focal seizure, hyperlipidemia, hypertension, renal cell cancer, thyroid disease, and obesity, who presents for CPAP titration. He had a baseline sleep study on 02/08/17 which showed a total AHI of 29.3/hour, REM sleep was absent. Baseline 02 saturation was 95%, with the lowest being 79%.   The patient's weight 248 pounds with a height of 72 (inches), resulting in a BMI of 33.4 kg/m2. The patient's neck circumference measured 17.4 inches.  CURRENT MEDICATIONS: Aspirin, Lipitor, Lantus, Keppra, Zestril, Lyrica, Topomax, Miralax   PROCEDURE:  This is a multichannel digital polysomnogram utilizing the SomnoStar 11.2 system.  Electrodes and sensors were applied and monitored per AASM Specifications.   EEG, EOG, Chin and Limb EMG, were sampled at 200 Hz.  ECG, Snore and Nasal Pressure, Thermal Airflow, Respiratory Effort, CPAP Flow and Pressure, Oximetry was sampled at 50 Hz. Digital video and audio were recorded.      The patient was fitted with a ResMed Airtouch F20 large FFM. CPAP was initiated at 7 cmH20 with heated humidity per AASM standards and pressure was advanced to 9cmH20 because of hypopneas, apneas and desaturations.  At a PAP pressure of 9 cmH20, there was a reduction of the AHI to 0 with non-supine REM sleep achieved and O2 nadir of 94%.     Lights Out was at 22:51 and Lights On at 05:03. Total recording time (TRT) was 372.5 minutes, with a total sleep time (TST) of 325.5 minutes. The patient's sleep latency was 1 minutes. REM latency was 46.5 minutes, which is mildly reduced.  The sleep efficiency was 87.4 %.    SLEEP ARCHITECTURE: WASO (Wake after sleep onset)  was 46  minutes.  There were 14 minutes in Stage N1, 197 minutes Stage N2, 39.5 minutes Stage N3 and 75 minutes in Stage REM.  The percentage of Stage N1 was 4.3%, Stage N2 was 60.5%, which is mildly increased, Stage N3 was 12.1% and Stage R (REM sleep) was 23.%, which is normal.   The arousals were noted as: 49 were spontaneous, 4 were associated with PLMs, 7 were associated with respiratory events.  Audio and video analysis did not show any abnormal or unusual movements, behaviors, phonations or vocalizations.  The patient took 1 bathroom break. The EKG was in keeping with normal sinus rhythm (NSR).  RESPIRATORY ANALYSIS:  There was a total of 10 respiratory events: 4 obstructive apneas, 0 central apneas and 0 mixed apneas with a total of 4 apneas and an apnea index (AI) of .7 /hour. There were 6 hypopneas with a hypopnea index of 1.1/hour. The patient also had 0 respiratory event related arousals (RERAs).      The total APNEA/HYPOPNEA INDEX  (AHI) was 1.8 /hour and the total RESPIRATORY DISTURBANCE INDEX was 1.8 .hour  0 events occurred in REM sleep and 10 events in NREM. The REM AHI was 0 /hour versus a non-REM AHI of 2.4 /hour.  The patient spent 0 minutes of total sleep time in the supine position and 326 minutes in non-supine. The supine AHI was 0.0, versus a non-supine AHI of 1.8.  OXYGEN SATURATION & C02:  The baseline 02 saturation was 97%,  with the lowest being 89%. Time spent below 89% saturation equaled 0 minutes.  PERIODIC LIMB MOVEMENTS: The patient had a total of 48 Periodic Limb Movements. The Periodic Limb Movement (PLM) index was 8.8 and the PLM Arousal index was .7 /hour.  Post-study, the patient indicated that sleep was better than usual (felt "great").   DIAGNOSIS 1. Obstructive Sleep Apnea   PLANS/RECOMMENDATIONS:  1. This study demonstrates resolution of the patient's obstructive sleep apnea with CPAP therapy. I will, therefore, start the patient on home CPAP treatment at a  pressure of 9 cm via large FFM with heated humidity. The patient should be reminded to be fully compliant with PAP therapy to improve sleep related symptoms and decrease long term cardiovascular risks. The patient should be reminded, that it may take up to 3 months to get fully used to using PAP with all planned sleep. The earlier full compliance is achieved, the better long term compliance tends to be. Please note that untreated obstructive sleep apnea carries additional perioperative morbidity. Patients with significant obstructive sleep apnea should receive perioperative PAP therapy and the surgeons and particularly the anesthesiologist should be informed of the diagnosis and the severity of the sleep disordered breathing. 2. The patient should be cautioned not to drive, work at heights, or operate dangerous or heavy equipment when tired or sleepy. Review and reiteration of good sleep hygiene measures should be pursued with any patient. 3. The patient will be seen in follow-up by Dr. Rexene Alberts at [facility] for discussion of the test results and further management strategies. The referring provider will be notified of the test results.   I certify that I have reviewed the entire raw data recording prior to the issuance of this report in accordance with the Standards of Accreditation of the American Academy of Sleep Medicine (AASM)      Star Age, MD, PhD Diplomat, American Board of Psychiatry and Neurology (Neurology and Sleep Medicine)

## 2017-03-23 NOTE — Progress Notes (Signed)
Patient referred by Dr. Leonie Man, seen by me on 02/08/17, diagnostic PSG on 02/23/17, CPAP study on 03/22/17.  Please call and inform patient that I have entered an order for treatment with positive airway pressure (PAP) treatment of obstructive sleep apnea (OSA). He did well during the latest sleep study with CPAP. We will, therefore, arrange for a machine for home use through a DME (durable medical equipment) company of His choice; and I will see the patient back in follow-up in about 10 weeks. Please also explain to the patient that I will be looking out for compliance data, which can be downloaded from the machine (stored on an SD card, that is inserted in the machine) or via remote access through a modem, that is built into the machine. At the time of the followup appointment we will discuss sleep study results and how it is going with PAP treatment at home. Please advise patient to bring His machine at the time of the first FU visit, even though this is cumbersome. Bringing the machine for every visit after that will likely not be needed, but often helps for the first visit to troubleshoot if needed. Please re-enforce the importance of compliance with treatment and the need for [place] to monitor compliance data - often an insurance requirement and actually good feedback for the patient as far as how they are doing.  Also remind patient, that any interim PAP machine or mask issues should be first addressed with the DME company, as they can often help better with technical and mask fit issues. Please ask if patient has a preference regarding DME company.  Please also make sure, the patient has a follow-up appointment with me in about 10 weeks from the setup date, thanks.  Once you have spoken to the patient - and faxed/routed report to PCP and referring MD (if other than PCP), you can close this encounter, thanks,   Star Age, MD, PhD [facility] ([place])

## 2017-03-23 NOTE — Telephone Encounter (Signed)
-----   Message from Star Age, MD sent at 03/23/2017  7:59 AM EDT ----- Patient referred by Dr. Leonie Man, seen by me on 02/08/17, diagnostic PSG on 02/23/17, CPAP study on 03/22/17.  Please call and inform patient that I have entered an order for treatment with positive airway pressure (PAP) treatment of obstructive sleep apnea (OSA). He did well during the latest sleep study with CPAP. We will, therefore, arrange for a machine for home use through a DME (durable medical equipment) company of His choice; and I will see the patient back in follow-up in about 10 weeks. Please also explain to the patient that I will be looking out for compliance data, which can be downloaded from the machine (stored on an SD card, that is inserted in the machine) or via remote access through a modem, that is built into the machine. At the time of the followup appointment we will discuss sleep study results and how it is going with PAP treatment at home. Please advise patient to bring His machine at the time of the first FU visit, even though this is cumbersome. Bringing the machine for every visit after that will likely not be needed, but often helps for the first visit to troubleshoot if needed. Please re-enforce the importance of compliance with treatment and the need for [place] to monitor compliance data - often an insurance requirement and actually good feedback for the patient as far as how they are doing.  Also remind patient, that any interim PAP machine or mask issues should be first addressed with the DME company, as they can often help better with technical and mask fit issues. Please ask if patient has a preference regarding DME company.  Please also make sure, the patient has a follow-up appointment with me in about 10 weeks from the setup date, thanks.  Once you have spoken to the patient - and faxed/routed report to PCP and referring MD (if other than PCP), you can close this encounter, thanks,   Star Age, MD,  PhD [facility] ([place])

## 2017-03-23 NOTE — Addendum Note (Signed)
Addended by: Star Age on: 03/23/2017 07:59 AM   Modules accepted: Orders

## 2017-03-23 NOTE — Telephone Encounter (Signed)
I spoke to patient and he is aware of results and recommendations. He is willing to start treatment. I will send orders to [facility]. I will send report to PCP.

## 2017-03-28 ENCOUNTER — Encounter: Payer: Self-pay | Admitting: Internal Medicine

## 2017-03-31 ENCOUNTER — Ambulatory Visit (INDEPENDENT_AMBULATORY_CARE_PROVIDER_SITE_OTHER): Payer: Medicaid Other

## 2017-03-31 ENCOUNTER — Ambulatory Visit (INDEPENDENT_AMBULATORY_CARE_PROVIDER_SITE_OTHER): Payer: Medicaid Other | Admitting: Orthopaedic Surgery

## 2017-03-31 DIAGNOSIS — G8929 Other chronic pain: Secondary | ICD-10-CM

## 2017-03-31 DIAGNOSIS — M25562 Pain in left knee: Secondary | ICD-10-CM

## 2017-03-31 MED ORDER — METHYLPREDNISOLONE ACETATE 40 MG/ML IJ SUSP
80.0000 mg | INTRAMUSCULAR | Status: AC | PRN
  Administered 2017-03-31: 80 mg

## 2017-03-31 MED ORDER — LIDOCAINE HCL 1 % IJ SOLN
5.0000 mL | INTRAMUSCULAR | Status: AC | PRN
  Administered 2017-03-31: 5 mL

## 2017-03-31 MED ORDER — BUPIVACAINE HCL 0.5 % IJ SOLN
3.0000 mL | INTRAMUSCULAR | Status: AC | PRN
  Administered 2017-03-31: 3 mL via INTRA_ARTICULAR

## 2017-03-31 NOTE — Progress Notes (Signed)
Office Visit Note   Patient: Brandon [name]           Date of Birth: 07-17-1961           MRN: [id number] Visit Date: 03/31/2017              Requested by: Brandon Hurl, PA-C [address] [place],  [id number] PCP: Brandon Oxford, PA-C   Assessment & Plan: Visit Diagnoses:  1. Chronic pain of left knee   Moderate osteoarthritis left knee,Mild osteoarthritis both hips. I believe based on his subjective complaints and exam that the problem is most likely related to his left knee  Plan: Cortisone injection medial compartment left knee, monitor over the next several weeks.  Follow-Up Instructions: No Follow-up on file.   Orders:  Orders Placed This Encounter  Procedures  . XR KNEE 3 VIEW LEFT  . XR Pelvis 1-2 Views   No orders of the defined types were placed in this encounter.     Procedures: Large Joint Inj Date/Time: 03/31/2017 10:18 AM Performed by: Brandon [name] Authorized by: Brandon [name]   Consent Given by:  Patient Timeout: prior to procedure the correct patient, procedure, and site was verified   Indications:  Pain and joint swelling Location:  Knee Site:  L knee Prep: patient was prepped and draped in usual sterile fashion   Needle Size:  25 G Needle Length:  1.5 inches Approach:  Anteromedial Ultrasound Guidance: No   Fluoroscopic Guidance: No   Arthrogram: No   Medications:  5 mL lidocaine 1 %; 80 mg methylPREDNISolone acetate 40 MG/ML; 3 mL bupivacaine 0.5 % Aspiration Attempted: No   Patient tolerance:  Patient tolerated the procedure well with no immediate complications     Clinical Data: No additional findings.   Subjective: Chief Complaint  Patient presents with  . Left Knee - Pain, Edema, Weakness    Brandon [name] is a 37 y o that presents with left knee painx 6 months. He has multiple issues that contribute to his falling. He had a stroke in  2013 and affected his left side and speech, weakness.  Mr.  [name] is 56 years old, we by family member and is here for evaluation of left knee pain. He's been experiencing pain for approximately 6 months that was insidious in onset without history of injury or trauma. He's had several falls which may be related to his knee but also due to the fact that his left lower extremity is weak as a result of a stroke in 2013. He is diabetic. He does locate the left knee pain along the medial compartment. He is experiencing achiness soreness and sensation of his knee giving way. He also was had some thigh and groin pain.  HPI  Review of Systems   Objective: Vital Signs: There were no vitals taken for this visit.  Physical Exam  Ortho Exam left knee without effusion. Moderate discomfort diffusely along the medial compartment. Some patellar crepitation. No pain laterally. Slight increased varus with weightbearing. Somewhat of a spastic  gait probably related to his prior stroke and left lower extremity weakness . Mild pain with internal/external rotation of his left hip. Not on the right.  Imaging: Xr Knee 3 View Left  Result Date: 03/31/2017 Films of the left knee retained and 3 projection standing. There is evidence of tricompartmental degenerative changes tickly and the medial compartment and patellofemoral joint there are peripheral osteophytes and subchondral sclerosis. The medial joint line is  narrowed but still open. There is approximately 1 of varus. No ectopic calcification. All consistent with moderate osteoarthritis left knee    PMFS History: Patient Active Problem List   Diagnosis Date Noted  . Other chronic pain 03/16/2017  . Nausea and vomiting 03/16/2017  . Chronic nonintractable headache 03/16/2017  . Morbid obesity ([place]) 03/16/2017  . Decreased ROM of neck 12/30/2016  . Sleep disturbance 12/29/2016  . Mood disturbance ([place]) 12/29/2016  . Vision changes 12/29/2016  . Tremor 12/29/2016  . Late effect of stroke 11/04/2016  .  Constipation 11/04/2016  . Microscopic hematuria 11/04/2016  . Gait disturbance 11/04/2016  . Financial difficulty 10/21/2016  . Acute right-sided thoracic back pain 10/21/2016  . Testicular discomfort 10/21/2016  . Urinary retention 10/21/2016  . Knee joint laxity 08/15/2016  . Fall 08/15/2016  . Special screening for malignant neoplasms, colon 08/15/2016  . Vaccine counseling 08/15/2016  . Left-sided weakness 08/01/2016  . Knee injury 08/01/2016  . Left knee pain 08/01/2016  . Diabetic ulcer of left foot associated with secondary diabetes mellitus ([place]) 04/21/2016  . AKI (acute kidney injury) ([place]) 03/03/2016  . Severe episode of recurrent major depressive disorder, without psychotic features ([place])   . History of seizure 01/05/2016  . History of stroke 01/05/2016  . Essential hypertension 01/05/2016  . Numbness 03/20/2015  . Headache syndrome 03/20/2015  . Depression, recurrent ([place]) 09/21/2014  . 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 Acute ischemic stroke ([place]) 11/16/2013  . Chronic pain syndrome 06/27/2013  . Renal cell carcinoma ([place]) 03/15/2012  . Essential hypertension, benign 03/15/2012  . Renal cell cancer ([place]) 12/30/2011  . Diverticulitis-history of 12/30/2011  . Insulin dependent diabetes mellitus ([place]) 12/30/2011  . Abdominal pain 12/30/2011   Past Medical History:  Diagnosis Date  . Acute ischemic stroke ([place]) 11/2013  . Anxiety   . Arthritis   . 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 Depression    BH Adm in [place] Depression  . Diabetes mellitus    DKA prior hospitalization  . Diverticulitis    s/p micorperforation Sept 2012-managed conservatively by Gen surgery  . ED (erectile dysfunction)   . Focal seizure ([place]) 11/2013   due to ischemic stroke  . Hyperlipidemia   . Hypertension   . Kidney tumor 09/2011   Renal cell CA  . Seizures ([place])    none since 2016, taking Keppra - maw  . Thyroid disease    "weak  thyroid" per MD  . Wears glasses     Family History  Problem Relation Age of Onset  . Diabetes Father   . Prostate cancer Other   . Stomach cancer Other   . Colon cancer Neg Hx   . Esophageal cancer Neg Hx   . Rectal cancer Neg Hx     Past Surgical History:  Procedure Laterality Date  . COLONOSCOPY    . KIDNEY SURGERY     ablation of renal cell CA - 12/28, prior one was October 2012-Dr. Kathlene Cote  . TEE WITHOUT CARDIOVERSION N/A 11/19/2013   Procedure: TRANSESOPHAGEAL ECHOCARDIOGRAM (TEE);  Surgeon: Lelon Perla, MD;  Location: [facility] ENDOSCOPY;  Service: Cardiovascular;  Laterality: N/A;   Social History   Occupational History  .  Unemployed   Social History Main Topics  . Smoking status: Never Smoker  . Smokeless tobacco: Never Used  . Alcohol use 1.2 oz/week    2 Cans of beer per week     Comment: one  weekly  . Drug use: No  . Sexual activity: No     Brandon Balding, MD   Note - This record has been created using [employer].  Chart creation errors have been sought, but may not always  have been located. Such creation errors do not reflect on  the standard of medical care.

## 2017-04-04 ENCOUNTER — Ambulatory Visit
Admission: RE | Admit: 2017-04-04 | Discharge: 2017-04-04 | Disposition: A | Discharge status: Home / Self Care | Payer: Medicaid Other | Source: Ambulatory Visit | Attending: Interventional Radiology | Admitting: Interventional Radiology

## 2017-04-04 ENCOUNTER — Encounter (HOSPITAL_COMMUNITY): Payer: Self-pay

## 2017-04-04 ENCOUNTER — Ambulatory Visit (HOSPITAL_COMMUNITY)
Admission: RE | Admit: 2017-04-04 | Discharge: 2017-04-04 | Disposition: A | Discharge status: Home / Self Care | Payer: Medicaid Other | Source: Ambulatory Visit | Attending: Interventional Radiology | Admitting: Interventional Radiology

## 2017-04-04 ENCOUNTER — Telehealth: Payer: Self-pay

## 2017-04-04 DIAGNOSIS — Z09 Encounter for follow-up examination after completed treatment for conditions other than malignant neoplasm: Secondary | ICD-10-CM | POA: Diagnosis not present

## 2017-04-04 DIAGNOSIS — K449 Diaphragmatic hernia without obstruction or gangrene: Secondary | ICD-10-CM | POA: Diagnosis not present

## 2017-04-04 DIAGNOSIS — Z9889 Other specified postprocedural states: Secondary | ICD-10-CM | POA: Insufficient documentation

## 2017-04-04 DIAGNOSIS — Z87448 Personal history of other diseases of urinary system: Secondary | ICD-10-CM | POA: Insufficient documentation

## 2017-04-04 DIAGNOSIS — N2889 Other specified disorders of kidney and ureter: Secondary | ICD-10-CM | POA: Diagnosis present

## 2017-04-04 DIAGNOSIS — K5730 Diverticulosis of large intestine without perforation or abscess without bleeding: Secondary | ICD-10-CM | POA: Insufficient documentation

## 2017-04-04 HISTORY — PX: IR RADIOLOGIST EVAL & MGMT: IMG5224

## 2017-04-04 LAB — POCT I-STAT CREATININE: Creatinine, Ser: 1.3 mg/dL — ABNORMAL HIGH (ref 0.61–1.24)

## 2017-04-04 MED ORDER — IOPAMIDOL (ISOVUE-300) INJECTION 61%
INTRAVENOUS | Status: AC
  Filled 2017-04-04: qty 100

## 2017-04-04 MED ORDER — IOPAMIDOL (ISOVUE-300) INJECTION 61%
100.0000 mL | Freq: Once | INTRAVENOUS | Status: AC | PRN
  Administered 2017-04-04: 100 mL via INTRAVENOUS

## 2017-04-04 NOTE — Telephone Encounter (Signed)
Fax request for refill of Lantus rcvd in office. Please send to [facility]. Brandon [name]

## 2017-04-04 NOTE — Progress Notes (Signed)
Referring Physician(s): Dr Arlyn Leak  Chief Complaint: The patient is seen in follow up today s/p  Status post percutaneous cryoablation of bilateral renal carcinomas. Right: 10/26/20112 and Left: 12/02/2011  History of present illness:  Has followed with [name] since 2012 procedures. Doing well. Denies pain; fever; chills. Has had no further issues after rather severe prolonged pain post left cryoablation 11/2011. Now only complaint is persistent and worsening LLQ abd pain accompanied with vomiting post prandially. Vomits within 15 minutes of eating almost anything. Light; mild diet does help some. Definite wt loss.  CT today: IMPRESSION: 1. Completely stable appearance of bilateral cryoablation defects in the kidneys. No new or progressive findings in either kidney to suggest local recurrence. No retroperitoneal lymphadenopathy. 2. Scattered diverticulosis in the colon with subtle pericolonic edema/inflammation surrounding an ill-defined diverticulum of the mid descending segment. Imaging features suspicious for diverticulitis. 3. Tiny hiatal hernia.   Past Medical History:  Diagnosis Date  . Acute ischemic stroke ([place]) 11/2013  . Anxiety   . Arthritis   . 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 Depression    BH Adm in [place] Depression  . Diabetes mellitus    DKA prior hospitalization  . Diverticulitis    s/p micorperforation Sept 2012-managed conservatively by Gen surgery  . ED (erectile dysfunction)   . Focal seizure ([place]) 11/2013   due to ischemic stroke  . Hyperlipidemia   . Hypertension   . Kidney tumor 09/2011   Renal cell CA  . Seizures ([place])    none since 2016, taking Keppra - maw  . Thyroid disease    "weak thyroid" per MD  . Wears glasses     Past Surgical History:  Procedure Laterality Date  . COLONOSCOPY    . KIDNEY SURGERY     ablation of renal cell CA - 12/28, prior one was October 2012-Dr.  Kathlene Cote  . TEE WITHOUT CARDIOVERSION N/A 11/19/2013   Procedure: TRANSESOPHAGEAL ECHOCARDIOGRAM (TEE);  Surgeon: Lelon Perla, MD;  Location: [facility] ENDOSCOPY;  Service: Cardiovascular;  Laterality: N/A;    Allergies: Morphine  Medications: Prior to Admission medications   Medication Sig Start Date End Date Taking? Authorizing Provider  aspirin 325 MG tablet Take by mouth.   Yes Historical Provider, MD  atorvastatin (LIPITOR) 40 MG tablet Take 1 tablet (40 mg total) by mouth daily. 11/04/16  Yes Camelia Eng Tysinger, PA-C  blood glucose meter kit and supplies Dispense based on patient and insurance preference. Use up to four times daily as directed. (FOR ICD-9 250.00, 250.01). 04/24/16  Yes Eugenie Filler, MD  Insulin Glargine (LANTUS) 100 UNIT/ML Solostar Pen Inject 45 Units into the skin daily at 10 pm. Patient taking differently: Inject 45 Units into the skin 2 (two) times daily.  11/04/16  Yes Camelia Eng Tysinger, PA-C  Insulin Pen Needle (PEN NEEDLES) 32G X 4 MM MISC 1 each by Does not apply route daily. Use for insulin pens 05/24/16  Yes Camelia Eng Tysinger, PA-C  levETIRAcetam (KEPPRA) 500 MG tablet 1/2 tablet po BID Patient taking differently: Take 250 mg by mouth daily.  11/04/16  Yes Camelia Eng Tysinger, PA-C  lisinopril (PRINIVIL,ZESTRIL) 20 MG tablet Take 1 tablet (20 mg total) by mouth daily. 11/04/16  Yes Camelia Eng Tysinger, PA-C  ondansetron (ZOFRAN) 4 MG tablet Take 1 tablet (4 mg total) by mouth every 8 (eight) hours as needed for nausea or vomiting. 03/16/17  Yes Camelia Eng  Tysinger, PA-C  topiramate (TOPAMAX) 50 MG tablet Take 1 tablet (50 mg total) by mouth 2 (two) times daily. 02/02/17  Yes Garvin Fila, MD  Exenatide ER 2 MG PEN Inject 2 mg into the skin. 02/27/17   Historical Provider, MD  polyethylene glycol powder (GLYCOLAX/MIRALAX) powder 1 cap full daily Patient not taking: Reported on 04/04/2017 02/16/17   Carlena Hurl, PA-C     Family History  Problem Relation Age of Onset  .  Diabetes Father   . Prostate cancer Other   . Stomach cancer Other   . Colon cancer Neg Hx   . Esophageal cancer Neg Hx   . Rectal cancer Neg Hx     Social History   Social History  . Marital status: Single    Spouse name: N/A  . Number of children: 2  . Years of education: college   Occupational History  .  Unemployed   Social History Main Topics  . Smoking status: Never Smoker  . Smokeless tobacco: Never Used  . Alcohol use 1.2 oz/week    2 Cans of beer per week     Comment: one weekly  . Drug use: No  . Sexual activity: No   Other Topics Concern  . Not on file   Social History Narrative   Patient lives at home with his family.   Education. Two years of college.   Not working.   Right handed.   Caffeine one soda daily. Drinks coffee      Vital Signs: BP (!) 142/86 (BP Location: Left Arm, Patient Position: Sitting, Cuff Size: Large)   Pulse 80   Temp 98 F (36.7 C) (Oral)   Resp 14   Ht 6' 0.25" (1.835 m)   Wt 275 lb (124.7 kg)   SpO2 99%   BMI 37.04 kg/m   Physical Exam  Constitutional: He is oriented to person, place, and time.  Cardiovascular: Normal rate and regular rhythm.   Pulmonary/Chest: Effort normal and breath sounds normal.  Abdominal: Soft. Bowel sounds are normal. He exhibits distension. There is tenderness.  Musculoskeletal: Normal range of motion.  Neurological: He is alert and oriented to person, place, and time.  Skin: Skin is warm and dry.  Psychiatric: He has a normal mood and affect. His behavior is normal.  Nursing note and vitals reviewed.   Imaging: Ct Abdomen W Wo Contrast  Result Date: 04/04/2017 CLINICAL DATA:  Bilateral renal cell carcinoma status post cryoablation. EXAM: CT ABDOMEN WITHOUT AND WITH CONTRAST TECHNIQUE: Multidetector CT imaging of the abdomen was performed following the standard protocol before and following the bolus administration of intravenous contrast. CONTRAST:  127m ISOVUE-300 IOPAMIDOL (ISOVUE-300)  INJECTION 61% COMPARISON:  01/01/2016 FINDINGS: Lower chest: Unremarkable. Hepatobiliary: No focal abnormality within the liver parenchyma. There is no evidence for gallstones, gallbladder wall thickening, or pericholecystic fluid. No intrahepatic or extrahepatic biliary dilation. Pancreas: No focal mass lesion. No dilatation of the main duct. No intraparenchymal cyst. No peripancreatic edema. Spleen: No splenomegaly. No focal mass lesion. Adrenals/Urinary Tract: No adrenal nodule or mass. Right renal cryoablation defect is completely stable in the interval. No unexpected enhancement in the treatment bed. Stable appearance of the tiny underlying caliceal diverticulum due to scarring from the ablation. Previously measured at 21 mm coronal long axis dimension, the ablation defect measures 19 mm in the same craniocaudal dimension on coronal imaging today (image 88 series 14). 9 mm subcapsular lower pole right renal lesion has subtly heterogeneous attenuation today, but has  been present on exams back to 08/02/2011 and is unchanged in size over that interval, most suggestive of a benign etiology. Posterior ablation defect in the left kidney near the junction of the interpolar region with the lower pole also appears completely stable since the prior study. This was also measured on coronal imaging previously at 21 mm and measuring today in the same direction on coronal imaging, the lesion measures 21 mm. Left kidney otherwise unremarkable. Stomach/Bowel: Tiny hiatal hernia. Stomach otherwise unremarkable. Duodenum is normally positioned as is the ligament of Treitz. No small bowel wall thickening. No small bowel dilatation. Visualized colonic segments of the abdomen show scattered diverticuli. There is some subtle pericolonic edema/ inflammation associated with the descending colon at about the level of the iliac crest. These changes are associated with an ill defined diverticulum well seen on image 78 of series 4.  Vascular/Lymphatic: There is abdominal aortic atherosclerosis without aneurysm. There is no gastrohepatic or hepatoduodenal ligament lymphadenopathy. No intraperitoneal or retroperitoneal lymphadenopathy. Other: No intraperitoneal free fluid. Musculoskeletal: Bone windows reveal no worrisome lytic or sclerotic osseous lesions. IMPRESSION: 1. Completely stable appearance of bilateral cryoablation defects in the kidneys. No new or progressive findings in either kidney to suggest local recurrence. No retroperitoneal lymphadenopathy. 2. Scattered diverticulosis in the colon with subtle pericolonic edema/inflammation surrounding an ill-defined diverticulum of the mid descending segment. Imaging features suspicious for diverticulitis. 3. Tiny hiatal hernia. I discussed the results of this study with Dr. Kathlene Cote at the time of study interpretation. Electronically Signed   By: Misty Stanley M.D.   On: 04/04/2017 09:16    Labs:  CBC:  Recent Labs  04/24/16 0431 07/25/16 1944 07/26/16 0909 10/17/16 1650  WBC 4.3 4.2 3.6* 5.1  HGB 11.8* 12.3* 13.1 13.2  HCT 33.6* 36.2* 39.5 37.6*  PLT 209 223 201 230    COAGS: No results for input(s): INR, APTT in the last 8760 hours.  BMP:  Recent Labs  04/24/16 0431 07/25/16 1944 07/26/16 0909 10/17/16 1650 04/04/17 0739  NA 139 141 140 139  --   K 3.6 4.1 3.4* 3.7  --   CL 104 110 109 104  --   CO2 '27 26 24 26  ' --   GLUCOSE 75 89 102* 239*  --   BUN 18 31* 27* 17  --   CALCIUM 8.8* 9.4 9.3 9.1  --   CREATININE 1.44* 1.23 1.28* 1.17 1.30*  GFRNONAA 54* >60 >60 >60  --   GFRAA >60 >60 >60 >60  --     LIVER FUNCTION TESTS:  Recent Labs  04/22/16 0355 04/23/16 0442 04/24/16 0431 10/17/16 1650  BILITOT 1.3* 1.5* 1.0 0.6  AST 15 20 12* 25  ALT 12* 14* 12* 20  ALKPHOS 99 94 74 90  PROT 7.6 8.0 7.0 6.8  ALBUMIN 3.8 4.0 3.4* 3.9    Assessment:  6 year follow up appt after B renal masses cryoablation; renal cell +clear cell type cancer per  biopsies CT today shows no recurrence or new lesions per [name]. --although mild inflammation at descending colon; could represent diverticulitis. No need for further follow up per [name] for renal cancer st this point. Rec: follow up with Dr Havery Moros who has seen this pt before for diverticulitis. Scheduler has called for appt even today.  SignedMonia Sabal A 04/04/2017, 2:34 PM   Please refer to Dr. Kathlene Cote attestation of this note for management and plan.

## 2017-04-05 MED ORDER — INSULIN GLARGINE 100 UNIT/ML SOLOSTAR PEN: 45.0000 [IU] | PEN_INJECTOR | Freq: Every day | SUBCUTANEOUS | 1 refills | Status: DC

## 2017-04-05 NOTE — Telephone Encounter (Signed)
Pt called requesting [facility] to [facility]

## 2017-04-05 NOTE — Telephone Encounter (Signed)
[facility]

## 2017-04-12 ENCOUNTER — Other Ambulatory Visit: Payer: Self-pay

## 2017-04-12 ENCOUNTER — Telehealth: Payer: Self-pay

## 2017-04-12 MED ORDER — METRONIDAZOLE 500 MG PO TABS: 500.0000 mg | ORAL_TABLET | Freq: Three times a day (TID) | ORAL | 0 refills | Status: AC

## 2017-04-12 MED ORDER — CIPROFLOXACIN HCL 500 MG PO TABS: 500.0000 mg | ORAL_TABLET | Freq: Two times a day (BID) | ORAL | 0 refills | Status: AC

## 2017-04-12 NOTE — Telephone Encounter (Signed)
Thanks Almyra Free,  To clarify for the chart, I received a message from Dr. Kathlene Cote about this patient's CT scan while I was out of town and just saw it today. Suspected diverticulitis, will be given antibiotics. He will need full colonoscopy given this finding with 2 day prep given his inability to adequately prep on his last 2 attempts. First colonoscopy showed diverticulosis which could correlate with CT findings but prep not good.

## 2017-04-12 NOTE — Telephone Encounter (Signed)
Spoke to patient he is still having abdominal pain. I have sent in the two antibiotics to his requested [facility]. He will be seeing Alonza Bogus, PA-C tomorrow, confirmed his appointment.

## 2017-04-13 ENCOUNTER — Encounter: Payer: Self-pay | Admitting: Gastroenterology

## 2017-04-13 ENCOUNTER — Ambulatory Visit (INDEPENDENT_AMBULATORY_CARE_PROVIDER_SITE_OTHER): Payer: Medicaid Other | Admitting: Gastroenterology

## 2017-04-13 VITALS — BP 98/66 | HR 72 | Ht 71.0 in | Wt 272.1 lb

## 2017-04-13 DIAGNOSIS — IMO0001 Reserved for inherently not codable concepts without codable children: Secondary | ICD-10-CM

## 2017-04-13 DIAGNOSIS — R933 Abnormal findings on diagnostic imaging of other parts of digestive tract: Secondary | ICD-10-CM

## 2017-04-13 DIAGNOSIS — E119 Type 2 diabetes mellitus without complications: Secondary | ICD-10-CM

## 2017-04-13 DIAGNOSIS — Z794 Long term (current) use of insulin: Secondary | ICD-10-CM

## 2017-04-13 DIAGNOSIS — K5732 Diverticulitis of large intestine without perforation or abscess without bleeding: Secondary | ICD-10-CM | POA: Diagnosis not present

## 2017-04-13 DIAGNOSIS — R1032 Left lower quadrant pain: Secondary | ICD-10-CM | POA: Diagnosis not present

## 2017-04-13 MED ORDER — NA SULFATE-K SULFATE-MG SULF 17.5-3.13-1.6 GM/177ML PO SOLN: 1.0000 | ORAL | 0 refills | Status: DC

## 2017-04-13 NOTE — Patient Instructions (Addendum)

## 2017-04-18 ENCOUNTER — Encounter: Payer: Self-pay | Admitting: Gastroenterology

## 2017-04-18 NOTE — Progress Notes (Signed)
Agree with assessment and plan as outlined.  

## 2017-04-18 NOTE — Progress Notes (Signed)
04/18/2017 Brandon [name] [id number] 01-08-1961   HISTORY OF PRESENT ILLNESS:  This is a pleasant 56 year old male who is known to Dr. Havery Moros for 2 attempted colonoscopies, one in 2017, and one already this year.  Both of those had to be aborted due to poor prep and large amount of retained stool in the colon. The patient returns here today at the request of Dr. Kathlene Cote. Patient follows with Dr. Kathlene Cote since 2012 when he had bilateral cryoablation for renal cell carcinomas. Patient was complaining of left lower quadrant abdominal pain and vomiting. CT scan of the abdomen and pelvis with contrast showed the following:  IMPRESSION: 1. Completely stable appearance of bilateral cryoablation defects in the kidneys. No new or progressive findings in either kidney to suggest local recurrence. No retroperitoneal lymphadenopathy. 2. Scattered diverticulosis in the colon with subtle pericolonic edema/inflammation surrounding an ill-defined diverticulum of the mid descending segment. Imaging features suspicious for diverticulitis. 3. Tiny hiatal hernia.  He has a history of diverticulitis with microperforation in 2012, which is when his renal cell carcinomas were incidentally found on imaging. That microperforation was managed conservatively.  He has prescriptions for Cipro 500 mg twice a day and metronidazole 500 mg 3 times daily, both for 10 days.  He has not picked those up yet, but will get them today and start taking them.   Past Medical History:  Diagnosis Date  . Acute ischemic stroke ([place]) 11/2013  . Anxiety   . Arthritis   . 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 Depression    BH Adm in [place] Depression  . Diabetes mellitus    DKA prior hospitalization  . Diverticulitis    s/p micorperforation Sept 2012-managed conservatively by Gen surgery  . ED (erectile dysfunction)   . Focal seizure ([place]) 11/2013   due to ischemic stroke  .  Hiatal hernia   . Hyperlipidemia   . Hypertension   . Kidney tumor 09/2011   Renal cell CA  . Seizures ([place])    none since 2016, taking Keppra - maw  . Thyroid disease    "weak thyroid" per MD  . Wears glasses    Past Surgical History:  Procedure Laterality Date  . COLONOSCOPY    . KIDNEY SURGERY     ablation of renal cell CA - 12/28, prior one was October 2012-Dr. Kathlene Cote  . TEE WITHOUT CARDIOVERSION N/A 11/19/2013   Procedure: TRANSESOPHAGEAL ECHOCARDIOGRAM (TEE);  Surgeon: Lelon Perla, MD;  Location: [facility] ENDOSCOPY;  Service: Cardiovascular;  Laterality: N/A;    reports that he has never smoked. He has never used smokeless tobacco. He reports that he drinks about 1.2 oz of alcohol per week . He reports that he does not use drugs. family history includes Diabetes in his father; Prostate cancer in his other; Stomach cancer in his other. Allergies  Allergen Reactions  . Morphine Itching      Outpatient Encounter Prescriptions as of 04/13/2017  Medication Sig  . aspirin 325 MG tablet Take by mouth.  [name] atorvastatin (LIPITOR) 40 MG tablet Take 1 tablet (40 mg total) by mouth daily.  . blood glucose meter kit and supplies Dispense based on patient and insurance preference. Use up to four times daily as directed. (FOR ICD-9 250.00, 250.01).  . ciprofloxacin (CIPRO) 500 MG tablet Take 1 tablet (500 mg total) by mouth 2 (two) times daily.  . Exenatide ER 2 MG PEN Inject 2 mg  into the skin.  . Insulin Glargine (LANTUS) 100 UNIT/ML Solostar Pen Inject 45 Units into the skin daily at 10 pm.  . Insulin Pen Needle (PEN NEEDLES) 32G X 4 MM MISC 1 each by Does not apply route daily. Use for insulin pens  . levETIRAcetam (KEPPRA) 500 MG tablet 1/2 tablet po BID (Patient taking differently: Take 250 mg by mouth daily. )  . lisinopril (PRINIVIL,ZESTRIL) 20 MG tablet Take 1 tablet (20 mg total) by mouth daily.  . metroNIDAZOLE (FLAGYL) 500 MG tablet Take 1 tablet (500 mg total) by mouth 3  (three) times daily.  . ondansetron (ZOFRAN) 4 MG tablet Take 1 tablet (4 mg total) by mouth every 8 (eight) hours as needed for nausea or vomiting.  . polyethylene glycol powder (GLYCOLAX/MIRALAX) powder 1 cap full daily  . topiramate (TOPAMAX) 50 MG tablet Take 1 tablet (50 mg total) by mouth 2 (two) times daily.  . Na Sulfate-K Sulfate-Mg Sulf 17.5-3.13-1.6 GM/180ML SOLN Take 1 kit by mouth as directed.  . [DISCONTINUED] Na Sulfate-K Sulfate-Mg Sulf 17.5-3.13-1.6 GM/180ML SOLN Take 1 kit by mouth as directed.   No facility-administered encounter medications on file as of 04/13/2017.      REVIEW OF SYSTEMS  : All other systems reviewed and negative except where noted in the History of Present Illness.   PHYSICAL EXAM: BP 98/66 (BP Location: Left Arm, Patient Position: Sitting, Cuff Size: Large)   Pulse 72   Ht '5\' 11"'  (1.803 m)   Wt 272 lb 2 oz (123.4 kg)   BMI 37.95 kg/m  General: Well developed black male in no acute distress; non-toxic appearing. Head: Normocephalic and atraumatic Eyes:  Sclerae anicteric, conjunctiva pink. Ears: Normal auditory acuity  Lungs: Clear throughout to auscultation Heart: Regular rate and rhythm Abdomen: Soft, non-distended.  BS present.  LLQ TTP. Rectal:  Will be done at the time of colonoscopy. Musculoskeletal: Symmetrical with no gross deformities  Skin: No lesions on visible extremities Extremities: No edema  Neurological: Alert oriented x 4, grossly non-focal Psychological:  Alert and cooperative. Normal mood and affect  ASSESSMENT AND PLAN: -56 year old male with history of diverticulitis with microperforation in 2012. Now with complaints of left lower quadrant abdominal pain and vomiting with CT scan suggesting mid descending colonic diverticulitis. Prescriptions for Cipro and Flagyl for 10 days have already been sent to his pharmacy, but he has not yet picked those up. He will get those and begin taking the medication. He knows to go to the  emergency department if symptoms worsen. If symptoms persist despite treatment with these antibiotics then he may need repeat CT imaging.  Otherwise, he is going to be scheduled for another colonoscopy with Dr. Havery Moros with a 2 day bowel prep in about 6 weeks. -IDDM:  Insulin will be adjusted prior to endoscopic procedure per protocol. Will resume normal dosing after procedure.  *The risks, benefits, and alternatives to colonoscopy were discussed with the patient and he consents to proceed.    CC:  Tysinger, Camelia Eng, PA-C

## 2017-04-20 ENCOUNTER — Encounter (INDEPENDENT_AMBULATORY_CARE_PROVIDER_SITE_OTHER): Payer: Self-pay | Admitting: Orthopaedic Surgery

## 2017-04-20 ENCOUNTER — Ambulatory Visit (INDEPENDENT_AMBULATORY_CARE_PROVIDER_SITE_OTHER): Payer: Medicaid Other | Admitting: Orthopaedic Surgery

## 2017-04-20 VITALS — BP 126/77 | HR 70 | Resp 14 | Ht 72.0 in | Wt 258.0 lb

## 2017-04-20 DIAGNOSIS — G8929 Other chronic pain: Secondary | ICD-10-CM | POA: Diagnosis not present

## 2017-04-20 DIAGNOSIS — M25562 Pain in left knee: Secondary | ICD-10-CM

## 2017-04-20 DIAGNOSIS — M25552 Pain in left hip: Secondary | ICD-10-CM | POA: Diagnosis not present

## 2017-04-20 DIAGNOSIS — M25551 Pain in right hip: Secondary | ICD-10-CM

## 2017-04-20 NOTE — Progress Notes (Signed)
Office Visit Note   Patient: Brandon [name]           Date of Birth: 1961-01-20           MRN: [id number] Visit Date: 04/20/2017              Requested by: Brandon [name] [address] [place], [place] [address] PCP: Brandon [name]   Assessment & Plan: Visit Diagnoses:  1. Pain in left hip   2. Chronic pain of left knee   3. Pain of both hip joints   Osteoarthritis left knee and both hips. 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   Orders:  Orders Placed This Encounter  Procedures  . Ambulatory referral to Physical Medicine Rehab   No orders of the defined types were placed in this encounter.     Procedures: No procedures performed   Clinical Data: No additional findings.   Subjective: Chief Complaint  Patient presents with  . Left Knee - Pain, Numbness, Edema     Brandon [name] is a 56 y o that presents with L knee pain x 6 months. He has multiple issues that contribute to his falling. He had a stroke in  2013 and affected his L eft side and speech, weakness. The cortisone shot on 4/27 helped for a few days and d  Seen several weeks ago for evaluation of left lower extremity pain. This problem seems to be a combination of arthritis of his left hip and left knee. At that time he was more symptomatic in the left knee so this was injected with cortisone. He notes that it did not "run up my blood sugars". He thinks it has made a difference. He presently seems to be having more problem with left groin pain. He's been wearing the knee brace  HPI  Review of Systems   Objective: Vital Signs: BP 126/77   Pulse 70   Resp 14   Ht 6' (1.829 m)   Wt 258 lb (117 kg)   BMI 34.99 kg/m   Physical Exam  Ortho Exam left knee without effusion. No particular medial lateral joint pain. No instability. Full  extension over 105 of flexion. No calf pain. No swelling distally. He does have some pain with internal and external rotation of his left hip on the extremes reflexes were depressed but symmetrical in both lower extremities.  Specialty Comments:  No specialty comments available.  Imaging: No results found.   PMFS History: Patient Active Problem List   Diagnosis Date Noted  . LLQ abdominal pain 04/18/2017  . Abnormal CT scan, colon 04/18/2017  . Diverticulitis of colon 04/18/2017  . Other chronic pain 03/16/2017  . Nausea and vomiting 03/16/2017  . Chronic nonintractable headache 03/16/2017  . Morbid obesity ([place]) 03/16/2017  . Decreased ROM of neck 12/30/2016  . Sleep disturbance 12/29/2016  . Mood disturbance ([place]) 12/29/2016  . Vision changes 12/29/2016  . Tremor 12/29/2016  . Late effect of stroke 11/04/2016  . Constipation 11/04/2016  . Microscopic hematuria 11/04/2016  . Gait disturbance 11/04/2016  . Financial difficulty 10/21/2016  . Acute right-sided thoracic back pain 10/21/2016  . Testicular discomfort 10/21/2016  . Urinary retention 10/21/2016  . Knee joint laxity 08/15/2016  . Fall 08/15/2016  . Special screening for malignant neoplasms, colon 08/15/2016  . Vaccine counseling 08/15/2016  .  Left-sided weakness 08/01/2016  . Knee injury 08/01/2016  . Left knee pain 08/01/2016  . Diabetic ulcer of left foot associated with secondary diabetes mellitus ([place]) 04/21/2016  . AKI (acute kidney injury) ([place]) 03/03/2016  . Severe episode of recurrent major depressive disorder, without psychotic features ([place])   . History of seizure 01/05/2016  . History of stroke 01/05/2016  . Essential hypertension 01/05/2016  . Numbness 03/20/2015  . Headache syndrome 03/20/2015  . Depression, recurrent ([place]) 09/21/2014  . 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 Acute ischemic stroke ([place]) 11/16/2013  . Chronic pain syndrome 06/27/2013  . Renal cell carcinoma ([place]) 03/15/2012  .  Essential hypertension, benign 03/15/2012  . Renal cell cancer ([place]) 12/30/2011  . Diverticulitis-history of 12/30/2011  . IDDM (insulin dependent diabetes mellitus) ([place]) 12/30/2011  . Abdominal pain 12/30/2011   Past Medical History:  Diagnosis Date  . Acute ischemic stroke ([place]) 11/2013  . Anxiety   . Arthritis   . 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 Depression    BH Adm in [place] Depression  . Diabetes mellitus    DKA prior hospitalization  . Diverticulitis    s/p micorperforation Sept 2012-managed conservatively by Gen surgery  . ED (erectile dysfunction)   . Focal seizure ([place]) 11/2013   due to ischemic stroke  . Hiatal hernia   . Hyperlipidemia   . Hypertension   . Kidney tumor 09/2011   Renal cell CA  . Seizures ([place])    none since 2016, taking Keppra - maw  . Thyroid disease    "weak thyroid" per MD  . Wears glasses     Family History  Problem Relation Age of Onset  . Diabetes Father   . Prostate cancer Other   . Stomach cancer Other   . Colon cancer Neg Hx   . Esophageal cancer Neg Hx   . Rectal cancer Neg Hx     Past Surgical History:  Procedure Laterality Date  . COLONOSCOPY    . KIDNEY SURGERY     ablation of renal cell CA - 12/28, prior one was October 2012-Dr. Kathlene Cote  . TEE WITHOUT CARDIOVERSION N/A 11/19/2013   Procedure: TRANSESOPHAGEAL ECHOCARDIOGRAM (TEE);  Surgeon: Lelon Perla, MD;  Location: [facility] ENDOSCOPY;  Service: Cardiovascular;  Laterality: N/A;   Social History   Occupational History  .  Unemployed   Social History Main Topics  . Smoking status: Never Smoker  . Smokeless tobacco: Never Used  . Alcohol use 1.2 oz/week    2 Cans of beer per week     Comment: one weekly  . Drug use: No  . Sexual activity: No     Brandon Balding, MD   Note - This record has been created using [employer].  Chart creation errors have been sought, but may not always  have been located. Such  creation errors do not reflect on  the standard of medical care.

## 2017-04-24 ENCOUNTER — Ambulatory Visit (INDEPENDENT_AMBULATORY_CARE_PROVIDER_SITE_OTHER): Payer: Medicaid Other | Admitting: Orthopaedic Surgery

## 2017-05-04 ENCOUNTER — Encounter: Payer: Self-pay | Admitting: Neurology

## 2017-05-05 ENCOUNTER — Telehealth: Payer: Self-pay | Admitting: Medical

## 2017-05-05 NOTE — Telephone Encounter (Signed)
Pt requesting referral to specialist for male impotence

## 2017-05-07 NOTE — Telephone Encounter (Signed)
I'm not sure we've talked about this recently so have him come in.   He may not need to see a specialist initially as we can probably help

## 2017-05-09 ENCOUNTER — Inpatient Hospital Stay (HOSPITAL_COMMUNITY)
Admission: EM | Admit: 2017-05-09 | Discharge: 2017-05-15 | DRG: 392 | Disposition: A | Discharge status: Home / Self Care | Payer: Medicaid Other | Attending: Internal Medicine | Admitting: Internal Medicine

## 2017-05-09 ENCOUNTER — Encounter (INDEPENDENT_AMBULATORY_CARE_PROVIDER_SITE_OTHER): Payer: Self-pay

## 2017-05-09 ENCOUNTER — Encounter (HOSPITAL_COMMUNITY): Payer: Self-pay | Admitting: Emergency Medicine

## 2017-05-09 DIAGNOSIS — Z794 Long term (current) use of insulin: Secondary | ICD-10-CM

## 2017-05-09 DIAGNOSIS — I6930 Unspecified sequelae of cerebral infarction: Secondary | ICD-10-CM

## 2017-05-09 DIAGNOSIS — Z7982 Long term (current) use of aspirin: Secondary | ICD-10-CM

## 2017-05-09 DIAGNOSIS — IMO0002 Reserved for concepts with insufficient information to code with codable children: Secondary | ICD-10-CM

## 2017-05-09 DIAGNOSIS — Z8673 Personal history of transient ischemic attack (TIA), and cerebral infarction without residual deficits: Secondary | ICD-10-CM

## 2017-05-09 DIAGNOSIS — K5732 Diverticulitis of large intestine without perforation or abscess without bleeding: Secondary | ICD-10-CM

## 2017-05-09 DIAGNOSIS — N179 Acute kidney failure, unspecified: Secondary | ICD-10-CM | POA: Diagnosis present

## 2017-05-09 DIAGNOSIS — Z6838 Body mass index (BMI) 38.0-38.9, adult: Secondary | ICD-10-CM

## 2017-05-09 DIAGNOSIS — E1169 Type 2 diabetes mellitus with other specified complication: Secondary | ICD-10-CM | POA: Diagnosis present

## 2017-05-09 DIAGNOSIS — E785 Hyperlipidemia, unspecified: Secondary | ICD-10-CM | POA: Diagnosis present

## 2017-05-09 DIAGNOSIS — E11649 Type 2 diabetes mellitus with hypoglycemia without coma: Secondary | ICD-10-CM | POA: Diagnosis present

## 2017-05-09 DIAGNOSIS — F329 Major depressive disorder, single episode, unspecified: Secondary | ICD-10-CM | POA: Diagnosis present

## 2017-05-09 DIAGNOSIS — N183 Chronic kidney disease, stage 3 unspecified: Secondary | ICD-10-CM | POA: Diagnosis present

## 2017-05-09 DIAGNOSIS — Z885 Allergy status to narcotic agent status: Secondary | ICD-10-CM

## 2017-05-09 DIAGNOSIS — E1121 Type 2 diabetes mellitus with diabetic nephropathy: Secondary | ICD-10-CM | POA: Diagnosis present

## 2017-05-09 DIAGNOSIS — Z87898 Personal history of other specified conditions: Secondary | ICD-10-CM

## 2017-05-09 DIAGNOSIS — E1122 Type 2 diabetes mellitus with diabetic chronic kidney disease: Secondary | ICD-10-CM | POA: Diagnosis present

## 2017-05-09 DIAGNOSIS — E1165 Type 2 diabetes mellitus with hyperglycemia: Secondary | ICD-10-CM

## 2017-05-09 DIAGNOSIS — I10 Essential (primary) hypertension: Secondary | ICD-10-CM | POA: Diagnosis present

## 2017-05-09 DIAGNOSIS — I152 Hypertension secondary to endocrine disorders: Secondary | ICD-10-CM | POA: Diagnosis present

## 2017-05-09 DIAGNOSIS — Z79899 Other long term (current) drug therapy: Secondary | ICD-10-CM

## 2017-05-09 DIAGNOSIS — I129 Hypertensive chronic kidney disease with stage 1 through stage 4 chronic kidney disease, or unspecified chronic kidney disease: Secondary | ICD-10-CM | POA: Diagnosis present

## 2017-05-09 DIAGNOSIS — E860 Dehydration: Secondary | ICD-10-CM | POA: Diagnosis present

## 2017-05-09 DIAGNOSIS — T464X5A Adverse effect of angiotensin-converting-enzyme inhibitors, initial encounter: Secondary | ICD-10-CM | POA: Diagnosis present

## 2017-05-09 DIAGNOSIS — E876 Hypokalemia: Secondary | ICD-10-CM | POA: Diagnosis present

## 2017-05-09 DIAGNOSIS — R569 Unspecified convulsions: Secondary | ICD-10-CM | POA: Diagnosis present

## 2017-05-09 DIAGNOSIS — K5792 Diverticulitis of intestine, part unspecified, without perforation or abscess without bleeding: Secondary | ICD-10-CM | POA: Diagnosis present

## 2017-05-09 DIAGNOSIS — R109 Unspecified abdominal pain: Secondary | ICD-10-CM | POA: Diagnosis present

## 2017-05-09 DIAGNOSIS — E1159 Type 2 diabetes mellitus with other circulatory complications: Secondary | ICD-10-CM | POA: Diagnosis present

## 2017-05-09 NOTE — ED Triage Notes (Signed)
Pt states he was diagnosed with diverticulitis and was told her had a "hole in my colon"  Pt stats he has been on antibiotics but in the last 48 hrs the pain has gotten much worse

## 2017-05-10 ENCOUNTER — Emergency Department (HOSPITAL_COMMUNITY): Payer: Medicaid Other

## 2017-05-10 ENCOUNTER — Encounter (HOSPITAL_COMMUNITY): Payer: Self-pay

## 2017-05-10 ENCOUNTER — Telehealth: Payer: Self-pay | Admitting: Neurology

## 2017-05-10 DIAGNOSIS — N179 Acute kidney failure, unspecified: Secondary | ICD-10-CM

## 2017-05-10 DIAGNOSIS — E876 Hypokalemia: Secondary | ICD-10-CM | POA: Diagnosis present

## 2017-05-10 DIAGNOSIS — I129 Hypertensive chronic kidney disease with stage 1 through stage 4 chronic kidney disease, or unspecified chronic kidney disease: Secondary | ICD-10-CM | POA: Diagnosis present

## 2017-05-10 DIAGNOSIS — N183 Chronic kidney disease, stage 3 (moderate): Secondary | ICD-10-CM

## 2017-05-10 DIAGNOSIS — Z87898 Personal history of other specified conditions: Secondary | ICD-10-CM

## 2017-05-10 DIAGNOSIS — K5732 Diverticulitis of large intestine without perforation or abscess without bleeding: Secondary | ICD-10-CM | POA: Diagnosis present

## 2017-05-10 DIAGNOSIS — Z794 Long term (current) use of insulin: Secondary | ICD-10-CM | POA: Diagnosis not present

## 2017-05-10 DIAGNOSIS — R1032 Left lower quadrant pain: Secondary | ICD-10-CM

## 2017-05-10 DIAGNOSIS — Z8673 Personal history of transient ischemic attack (TIA), and cerebral infarction without residual deficits: Secondary | ICD-10-CM | POA: Diagnosis not present

## 2017-05-10 DIAGNOSIS — E1169 Type 2 diabetes mellitus with other specified complication: Secondary | ICD-10-CM | POA: Diagnosis not present

## 2017-05-10 DIAGNOSIS — Z885 Allergy status to narcotic agent status: Secondary | ICD-10-CM | POA: Diagnosis not present

## 2017-05-10 DIAGNOSIS — E0821 Diabetes mellitus due to underlying condition with diabetic nephropathy: Secondary | ICD-10-CM

## 2017-05-10 DIAGNOSIS — E1165 Type 2 diabetes mellitus with hyperglycemia: Secondary | ICD-10-CM

## 2017-05-10 DIAGNOSIS — K5792 Diverticulitis of intestine, part unspecified, without perforation or abscess without bleeding: Secondary | ICD-10-CM

## 2017-05-10 DIAGNOSIS — E1159 Type 2 diabetes mellitus with other circulatory complications: Secondary | ICD-10-CM | POA: Diagnosis present

## 2017-05-10 DIAGNOSIS — Z6838 Body mass index (BMI) 38.0-38.9, adult: Secondary | ICD-10-CM | POA: Diagnosis not present

## 2017-05-10 DIAGNOSIS — IMO0002 Reserved for concepts with insufficient information to code with codable children: Secondary | ICD-10-CM

## 2017-05-10 DIAGNOSIS — I10 Essential (primary) hypertension: Secondary | ICD-10-CM | POA: Diagnosis not present

## 2017-05-10 DIAGNOSIS — E785 Hyperlipidemia, unspecified: Secondary | ICD-10-CM

## 2017-05-10 DIAGNOSIS — R569 Unspecified convulsions: Secondary | ICD-10-CM | POA: Diagnosis present

## 2017-05-10 DIAGNOSIS — Z79899 Other long term (current) drug therapy: Secondary | ICD-10-CM | POA: Diagnosis not present

## 2017-05-10 DIAGNOSIS — T464X5A Adverse effect of angiotensin-converting-enzyme inhibitors, initial encounter: Secondary | ICD-10-CM | POA: Diagnosis present

## 2017-05-10 DIAGNOSIS — E1122 Type 2 diabetes mellitus with diabetic chronic kidney disease: Secondary | ICD-10-CM | POA: Diagnosis present

## 2017-05-10 DIAGNOSIS — I6930 Unspecified sequelae of cerebral infarction: Secondary | ICD-10-CM

## 2017-05-10 DIAGNOSIS — Z7982 Long term (current) use of aspirin: Secondary | ICD-10-CM | POA: Diagnosis not present

## 2017-05-10 DIAGNOSIS — F329 Major depressive disorder, single episode, unspecified: Secondary | ICD-10-CM | POA: Diagnosis present

## 2017-05-10 DIAGNOSIS — I152 Hypertension secondary to endocrine disorders: Secondary | ICD-10-CM | POA: Diagnosis present

## 2017-05-10 DIAGNOSIS — E860 Dehydration: Secondary | ICD-10-CM | POA: Diagnosis present

## 2017-05-10 DIAGNOSIS — E1121 Type 2 diabetes mellitus with diabetic nephropathy: Secondary | ICD-10-CM | POA: Diagnosis present

## 2017-05-10 DIAGNOSIS — E11649 Type 2 diabetes mellitus with hypoglycemia without coma: Secondary | ICD-10-CM | POA: Diagnosis present

## 2017-05-10 LAB — COMPREHENSIVE METABOLIC PANEL
ALBUMIN: 4.2 g/dL (ref 3.5–5.0)
ALK PHOS: 97 U/L (ref 38–126)
ALT: 21 U/L (ref 17–63)
AST: 19 U/L (ref 15–41)
Anion gap: 11 (ref 5–15)
BUN: 23 mg/dL — ABNORMAL HIGH (ref 6–20)
CHLORIDE: 102 mmol/L (ref 101–111)
CO2: 23 mmol/L (ref 22–32)
Calcium: 9.2 mg/dL (ref 8.9–10.3)
Creatinine, Ser: 1.67 mg/dL — ABNORMAL HIGH (ref 0.61–1.24)
GFR calc Af Amer: 52 mL/min — ABNORMAL LOW (ref >60)
GFR calc non Af Amer: 45 mL/min — ABNORMAL LOW (ref >60)
GLUCOSE: 333 mg/dL — AB (ref 65–99)
POTASSIUM: 3.7 mmol/L (ref 3.5–5.1)
SODIUM: 136 mmol/L (ref 135–145)
Total Bilirubin: 1.6 mg/dL — ABNORMAL HIGH (ref 0.3–1.2)
Total Protein: 7.3 g/dL (ref 6.5–8.1)

## 2017-05-10 LAB — GLUCOSE, CAPILLARY
GLUCOSE-CAPILLARY: 122 mg/dL — AB (ref 65–99)
GLUCOSE-CAPILLARY: 48 mg/dL — AB (ref 65–99)
GLUCOSE-CAPILLARY: 55 mg/dL — AB (ref 65–99)
GLUCOSE-CAPILLARY: 73 mg/dL (ref 65–99)
GLUCOSE-CAPILLARY: 79 mg/dL (ref 65–99)
GLUCOSE-CAPILLARY: 91 mg/dL (ref 65–99)
Glucose-Capillary: 68 mg/dL (ref 65–99)

## 2017-05-10 LAB — CBC
HEMATOCRIT: 33.9 % — AB (ref 39.0–52.0)
HEMOGLOBIN: 12 g/dL — AB (ref 13.0–17.0)
MCH: 29.6 pg (ref 26.0–34.0)
MCHC: 35.4 g/dL (ref 30.0–36.0)
MCV: 83.7 fL (ref 78.0–100.0)
Platelets: 200 10*3/uL (ref 150–400)
RBC: 4.05 MIL/uL — ABNORMAL LOW (ref 4.22–5.81)
RDW: 11.7 % (ref 11.5–15.5)
WBC: 6 10*3/uL (ref 4.0–10.5)

## 2017-05-10 LAB — URINALYSIS, ROUTINE W REFLEX MICROSCOPIC
BACTERIA UA: NONE SEEN
BILIRUBIN URINE: NEGATIVE
Glucose, UA: 150 mg/dL — AB
KETONES UR: NEGATIVE mg/dL
Nitrite: NEGATIVE
Protein, ur: NEGATIVE mg/dL
Specific Gravity, Urine: 1.012 (ref 1.005–1.030)
Squamous Epithelial / LPF: NONE SEEN
pH: 5 (ref 5.0–8.0)

## 2017-05-10 LAB — LIPASE, BLOOD: Lipase: 25 U/L (ref 11–51)

## 2017-05-10 MED ORDER — AMOXICILLIN-POT CLAVULANATE 875-125 MG PO TABS
1.0000 | ORAL_TABLET | Freq: Once | ORAL | Status: AC
  Administered 2017-05-10: 1 via ORAL
  Filled 2017-05-10: qty 1

## 2017-05-10 MED ORDER — SODIUM CHLORIDE 0.9 % IV BOLUS (SEPSIS)
1000.0000 mL | Freq: Once | INTRAVENOUS | Status: AC
  Administered 2017-05-10: 1000 mL via INTRAVENOUS

## 2017-05-10 MED ORDER — DEXTROSE-NACL 5-0.45 % IV SOLN
INTRAVENOUS | Status: DC
  Administered 2017-05-10 – 2017-05-12 (×2): via INTRAVENOUS
  Administered 2017-05-12: 1000 mL via INTRAVENOUS

## 2017-05-10 MED ORDER — ONDANSETRON HCL 4 MG/2ML IJ SOLN: 4.0000 mg | Freq: Four times a day (QID) | INTRAMUSCULAR | Status: DC | PRN

## 2017-05-10 MED ORDER — HYDROCODONE-ACETAMINOPHEN 5-325 MG PO TABS
1.0000 | ORAL_TABLET | Freq: Once | ORAL | Status: AC
  Administered 2017-05-10: 1 via ORAL
  Filled 2017-05-10: qty 1

## 2017-05-10 MED ORDER — SODIUM CHLORIDE 0.9 % IV SOLN: 3.0000 g | Freq: Once | INTRAVENOUS | Status: DC

## 2017-05-10 MED ORDER — SODIUM CHLORIDE 0.9 % IV SOLN
INTRAVENOUS | Status: DC
  Administered 2017-05-10: 08:00:00 via INTRAVENOUS

## 2017-05-10 MED ORDER — ATORVASTATIN CALCIUM 40 MG PO TABS
40.0000 mg | ORAL_TABLET | Freq: Every day | ORAL | Status: DC
  Administered 2017-05-10 – 2017-05-15 (×6): 40 mg via ORAL
  Filled 2017-05-10 (×6): qty 1

## 2017-05-10 MED ORDER — INSULIN ASPART 100 UNIT/ML ~~LOC~~ SOLN: 0.0000 [IU] | Freq: Every day | SUBCUTANEOUS | Status: DC

## 2017-05-10 MED ORDER — ACETAMINOPHEN 325 MG PO TABS: 650.0000 mg | ORAL_TABLET | Freq: Four times a day (QID) | ORAL | Status: DC | PRN

## 2017-05-10 MED ORDER — FENTANYL CITRATE (PF) 100 MCG/2ML IJ SOLN: 50.0000 ug | Freq: Once | INTRAMUSCULAR | Status: DC

## 2017-05-10 MED ORDER — SODIUM CHLORIDE 0.9 % IV SOLN
250.0000 mg | INTRAVENOUS | Status: DC
  Administered 2017-05-10: 250 mg via INTRAVENOUS
  Filled 2017-05-10: qty 2.5

## 2017-05-10 MED ORDER — ASPIRIN 325 MG PO TABS
325.0000 mg | ORAL_TABLET | Freq: Every day | ORAL | Status: DC
  Administered 2017-05-10 – 2017-05-15 (×6): 325 mg via ORAL
  Filled 2017-05-10 (×6): qty 1

## 2017-05-10 MED ORDER — FENTANYL CITRATE (PF) 100 MCG/2ML IJ SOLN
50.0000 ug | INTRAMUSCULAR | Status: DC | PRN
  Administered 2017-05-10 – 2017-05-12 (×10): 50 ug via INTRAVENOUS
  Filled 2017-05-10 (×10): qty 2

## 2017-05-10 MED ORDER — IOPAMIDOL (ISOVUE-300) INJECTION 61%
100.0000 mL | Freq: Once | INTRAVENOUS | Status: AC | PRN
  Administered 2017-05-10: 80 mL via INTRAVENOUS

## 2017-05-10 MED ORDER — TOPIRAMATE 25 MG PO TABS
50.0000 mg | ORAL_TABLET | Freq: Two times a day (BID) | ORAL | Status: DC
  Administered 2017-05-10 – 2017-05-15 (×11): 50 mg via ORAL
  Filled 2017-05-10 (×11): qty 2

## 2017-05-10 MED ORDER — ACETAMINOPHEN 650 MG RE SUPP
650.0000 mg | Freq: Four times a day (QID) | RECTAL | Status: DC | PRN
Start: 2017-05-10 — End: 2017-05-15

## 2017-05-10 MED ORDER — INSULIN ASPART 100 UNIT/ML ~~LOC~~ SOLN
0.0000 [IU] | Freq: Three times a day (TID) | SUBCUTANEOUS | Status: DC
  Administered 2017-05-13: 5 [IU] via SUBCUTANEOUS
  Administered 2017-05-13 (×2): 3 [IU] via SUBCUTANEOUS
  Administered 2017-05-14: 5 [IU] via SUBCUTANEOUS
  Administered 2017-05-14: 3 [IU] via SUBCUTANEOUS
  Administered 2017-05-14: 8 [IU] via SUBCUTANEOUS
  Administered 2017-05-15: 3 [IU] via SUBCUTANEOUS

## 2017-05-10 MED ORDER — LEVETIRACETAM 250 MG PO TABS
250.0000 mg | ORAL_TABLET | Freq: Every day | ORAL | Status: DC
  Administered 2017-05-11 – 2017-05-15 (×5): 250 mg via ORAL
  Filled 2017-05-10 (×5): qty 1

## 2017-05-10 MED ORDER — FENTANYL CITRATE (PF) 100 MCG/2ML IJ SOLN
50.0000 ug | Freq: Once | INTRAMUSCULAR | Status: AC
  Administered 2017-05-10: 50 ug via INTRAVENOUS
  Filled 2017-05-10: qty 2

## 2017-05-10 MED ORDER — SODIUM CHLORIDE 0.9 % IV SOLN
3.0000 g | Freq: Four times a day (QID) | INTRAVENOUS | Status: DC
  Administered 2017-05-10 – 2017-05-15 (×20): 3 g via INTRAVENOUS
  Filled 2017-05-10 (×23): qty 3

## 2017-05-10 MED ORDER — IOPAMIDOL (ISOVUE-300) INJECTION 61%
INTRAVENOUS | Status: AC
  Administered 2017-05-10: 80 mL via INTRAVENOUS
  Filled 2017-05-10: qty 100

## 2017-05-10 MED ORDER — ONDANSETRON HCL 4 MG/2ML IJ SOLN
4.0000 mg | Freq: Once | INTRAMUSCULAR | Status: AC
  Administered 2017-05-10: 4 mg via INTRAVENOUS
  Filled 2017-05-10: qty 2

## 2017-05-10 MED ORDER — ONDANSETRON HCL 4 MG PO TABS
4.0000 mg | ORAL_TABLET | Freq: Four times a day (QID) | ORAL | Status: DC | PRN
Start: 2017-05-10 — End: 2017-05-15

## 2017-05-10 MED ORDER — INSULIN GLARGINE 100 UNIT/ML ~~LOC~~ SOLN
25.0000 [IU] | Freq: Every day | SUBCUTANEOUS | Status: DC
  Administered 2017-05-10: 25 [IU] via SUBCUTANEOUS
  Filled 2017-05-10 (×2): qty 0.25

## 2017-05-10 NOTE — ED Provider Notes (Signed)
[facility] DEPT Provider Note   CSN: [id number] Arrival date & time: 05/09/17  2214  By signing my name below, I, Ny'Kea Lewis, attest that this documentation has been prepared under the direction and in the presence of Ripley Fraise, MD. Electronically Signed: Lise Auer, ED Scribe. 05/10/17. 1:12 AM.  History   Chief Complaint Chief Complaint  Patient presents with  . Abdominal Pain   HPI Brandon [name] is a 56 y.o. male.  The history is provided by the patient and a relative. No language interpreter was used.  Abdominal Pain   This is a recurrent problem. The current episode started more than 2 days ago. The problem occurs constantly. The problem has been gradually worsening. The pain is located in the LLQ and LUQ. The pain is at a severity of 10/10. The pain is severe. Pertinent negatives include diarrhea and dysuria. Past workup includes CT scan.   Past Medical History:  Diagnosis Date  . Acute ischemic stroke ([place]) 11/2013  . Anxiety   . Arthritis   . 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 Depression    BH Adm in [place] Depression  . Diabetes mellitus    DKA prior hospitalization  . Diverticulitis    s/p micorperforation Sept 2012-managed conservatively by Gen surgery  . ED (erectile dysfunction)   . Focal seizure ([place]) 11/2013   due to ischemic stroke  . Hiatal hernia   . Hyperlipidemia   . Hypertension   . Kidney tumor 09/2011   Renal cell CA  . Seizures ([place])    none since 2016, taking Keppra - maw  . Thyroid disease    "weak thyroid" per MD  . Wears glasses     Patient Active Problem List   Diagnosis Date Noted  . LLQ abdominal pain 04/18/2017  . Abnormal CT scan, colon 04/18/2017  . Diverticulitis of colon 04/18/2017  . Other chronic pain 03/16/2017  . Nausea and vomiting 03/16/2017  . Chronic nonintractable headache 03/16/2017  . Morbid obesity ([place]) 03/16/2017  . Decreased ROM of neck 12/30/2016  .  Sleep disturbance 12/29/2016  . Mood disturbance ([place]) 12/29/2016  . Vision changes 12/29/2016  . Tremor 12/29/2016  . Late effect of stroke 11/04/2016  . Constipation 11/04/2016  . Microscopic hematuria 11/04/2016  . Gait disturbance 11/04/2016  . Financial difficulty 10/21/2016  . Acute right-sided thoracic back pain 10/21/2016  . Testicular discomfort 10/21/2016  . Urinary retention 10/21/2016  . Knee joint laxity 08/15/2016  . Fall 08/15/2016  . Special screening for malignant neoplasms, colon 08/15/2016  . Vaccine counseling 08/15/2016  . Left-sided weakness 08/01/2016  . Knee injury 08/01/2016  . Left knee pain 08/01/2016  . Diabetic ulcer of left foot associated with secondary diabetes mellitus ([place]) 04/21/2016  . AKI (acute kidney injury) ([place]) 03/03/2016  . Severe episode of recurrent major depressive disorder, without psychotic features ([place])   . History of seizure 01/05/2016  . History of stroke 01/05/2016  . Essential hypertension 01/05/2016  . Numbness 03/20/2015  . Headache syndrome 03/20/2015  . Depression, recurrent ([place]) 09/21/2014  . 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 Acute ischemic stroke ([place]) 11/16/2013  . Chronic pain syndrome 06/27/2013  . Renal cell carcinoma ([place]) 03/15/2012  . Essential hypertension, benign 03/15/2012  . Renal cell cancer ([place]) 12/30/2011  . Diverticulitis-history of 12/30/2011  . IDDM (insulin dependent diabetes mellitus) ([place]) 12/30/2011  . Abdominal pain 12/30/2011    Past Surgical History:  Procedure  Laterality Date  . COLONOSCOPY    . KIDNEY SURGERY     ablation of renal cell CA - 12/28, prior one was October 2012-Dr. Kathlene Cote  . TEE WITHOUT CARDIOVERSION N/A 11/19/2013   Procedure: TRANSESOPHAGEAL ECHOCARDIOGRAM (TEE);  Surgeon: Lelon Perla, MD;  Location: [facility] ENDOSCOPY;  Service: Cardiovascular;  Laterality: N/A;       Home Medications    Prior to Admission medications   Medication Sig Start Date End Date  Taking? Authorizing Provider  aspirin 325 MG tablet Take 325 mg by mouth daily.    Yes [provider]  atorvastatin (LIPITOR) 40 MG tablet Take 1 tablet (40 mg total) by mouth daily. 11/04/16  Yes Tysinger, Camelia Eng, PA-C  Insulin Glargine (LANTUS) 100 UNIT/ML Solostar Pen Inject 45 Units into the skin daily at 10 pm. 04/05/17  Yes Tysinger, Camelia Eng, PA-C  levETIRAcetam (KEPPRA) 500 MG tablet 1/2 tablet po BID Patient taking differently: Take 250 mg by mouth daily.  11/04/16  Yes Tysinger, Camelia Eng, PA-C  lisinopril (PRINIVIL,ZESTRIL) 20 MG tablet Take 1 tablet (20 mg total) by mouth daily. 11/04/16  Yes Tysinger, Camelia Eng, PA-C  ondansetron (ZOFRAN) 4 MG tablet Take 1 tablet (4 mg total) by mouth every 8 (eight) hours as needed for nausea or vomiting. 03/16/17  Yes Tysinger, Camelia Eng, PA-C  polyethylene glycol powder (GLYCOLAX/MIRALAX) powder 1 cap full daily 02/16/17  Yes Tysinger, Camelia Eng, PA-C  topiramate (TOPAMAX) 50 MG tablet Take 1 tablet (50 mg total) by mouth 2 (two) times daily. 02/02/17  Yes Garvin Fila, MD  blood glucose meter kit and supplies Dispense based on patient and insurance preference. Use up to four times daily as directed. (FOR ICD-9 250.00, 250.01). 04/24/16   Eugenie Filler, MD  Exenatide ER 2 MG PEN Inject 2 mg into the skin. 02/27/17   [provider]  Insulin Pen Needle (PEN NEEDLES) 32G X 4 MM MISC 1 each by Does not apply route daily. Use for insulin pens 05/24/16   Tysinger, Camelia Eng, PA-C  Na Sulfate-K Sulfate-Mg Sulf 17.5-3.13-1.6 GM/180ML SOLN Take 1 kit by mouth as directed. Patient not taking: Reported on 05/10/2017 04/13/17   Zehr, Laban Emperor, PA-C    Family History Family History  Problem Relation Age of Onset  . Diabetes Father   . Prostate cancer Other   . Stomach cancer Other   . Colon cancer Neg Hx   . Esophageal cancer Neg Hx   . Rectal cancer Neg Hx     Social History Social History  Substance Use Topics  . Smoking status: Never Smoker    . Smokeless tobacco: Never Used  . Alcohol use 1.2 oz/week    2 Cans of beer per week     Comment: one weekly     Allergies   Morphine   Review of Systems Review of Systems  Constitutional: Positive for appetite change and chills.  Cardiovascular: Negative for chest pain.  Gastrointestinal: Positive for abdominal pain. Negative for diarrhea.  Genitourinary: Negative for dysuria.  Musculoskeletal: Positive for back pain.  All other systems reviewed and are negative.   Physical Exam Updated Vital Signs BP 101/84 (BP Location: Right Arm)   Pulse 92   Temp 98.4 F (36.9 C) (Oral)   Resp 18   SpO2 99%   Physical Exam CONSTITUTIONAL: Well developed/well nourished HEAD: Normocephalic/atraumatic EYES: EOMI/PERRL ENMT: Mucous membranes moist NECK: supple no meningeal signs SPINE/BACK:entire spine nontender CV: S1/S2 noted LUNGS: Lungs are clear  to auscultation bilaterally, no apparent distress ABDOMEN: soft, moderate LLQ tenderness, no rebound or guarding, decreased bowel sounds throughout.  GU:no cva tenderness, no hernia, or testicular tenderness (Chaperone present.) NEURO: Pt is awake/alert/appropriate, moves all extremitiesx4.  No facial droop.   EXTREMITIES: pulses normal/equal, full ROM SKIN: warm, color normal PSYCH: no abnormalities of mood noted, alert and oriented to situation  ED Treatments / Results  DIAGNOSTIC STUDIES: Oxygen Saturation is 99% on RA, normal by my interpretation.   COORDINATION OF CARE: 1:09 AM-Discussed next steps with pt. Pt verbalized understanding and is agreeable with the plan.   Labs (all labs ordered are listed, but only abnormal results are displayed) Labs Reviewed  COMPREHENSIVE METABOLIC PANEL - Abnormal; Notable for the following:       Result Value   Glucose, Bld 333 (*)    BUN 23 (*)    Creatinine, Ser 1.67 (*)    Total Bilirubin 1.6 (*)    GFR calc non Af Amer 45 (*)    GFR calc Af Amer 52 (*)    All other  components within normal limits  CBC - Abnormal; Notable for the following:    RBC 4.05 (*)    Hemoglobin 12.0 (*)    HCT 33.9 (*)    All other components within normal limits  URINALYSIS, ROUTINE W REFLEX MICROSCOPIC - Abnormal; Notable for the following:    Glucose, UA 150 (*)    Hgb urine dipstick SMALL (*)    Leukocytes, UA TRACE (*)    All other components within normal limits  LIPASE, BLOOD    EKG  EKG Interpretation None       Radiology Ct Abdomen Pelvis W Contrast  Result Date: 05/10/2017 CLINICAL DATA:  Left lower quadrant pain. EXAM: CT ABDOMEN AND PELVIS WITH CONTRAST TECHNIQUE: Multidetector CT imaging of the abdomen and pelvis was performed using the standard protocol following bolus administration of intravenous contrast. CONTRAST:  80 cc Isovue-300 IV COMPARISON:  Renal protocol CT 04/04/2017 FINDINGS: Lower chest: No consolidation or pleural fluid. Hepatobiliary: No focal liver abnormality is seen. No gallstones, gallbladder wall thickening, or biliary dilatation. Pancreas: No ductal dilatation or inflammation. Spleen: Normal in size without focal abnormality. Adrenals/Urinary Tract: No adrenal nodule. Unchanged size and appearance of bilateral cryoablation defects. Small 9 mm subcapsular hypodense lesion in the lower right kidney is unchanged. No hydronephrosis or perinephric edema. There is symmetric renal excretion on delayed phase imaging. Urinary bladder is minimally distended, question bladder wall thickening. Stomach/Bowel: Mild pericolonic soft tissue edema at the junction of the descending and proximal sigmoid colon in the region of multiple colonic diverticula, suspect mild diverticulitis. This is more distal than the region of previous inflammation in the mid descending colon, that prior inflammation has resolved. Multiple additional noninflamed diverticula in the distal colon. Normal appendix. Small hiatal hernia. No small bowel dilatation. Vascular/Lymphatic:  Aortic atherosclerosis without aneurysm. No retroperitoneal adenopathy. Reproductive: Prostate is unremarkable. Other: Minimal fat within both inguinal canals. No free air, free fluid, or intra-abdominal fluid collection. Musculoskeletal: Stable degenerative change throughout the spine and both hips. Partial ankylosis of both sacroiliac joints. IMPRESSION: 1. Mild acute diverticulitis at the junction of the descending and sigmoid colon. No perforation or abscess. 2. Bladder wall thickening may be secondary to nondistention or cystitis. 3. Unchanged appearance of bilateral renal cryoablation defects. 4. Aortic atherosclerosis. Electronically Signed   By: Jeb Levering M.D.   On: 05/10/2017 04:05    Procedures Procedures (including critical care time)  Medications  Ordered in ED Medications  Ampicillin-Sulbactam (UNASYN) 3 g in sodium chloride 0.9 % 100 mL IVPB (not administered)  fentaNYL (SUBLIMAZE) injection 50 mcg (not administered)  fentaNYL (SUBLIMAZE) injection 50 mcg (50 mcg Intravenous Given 05/10/17 0143)  ondansetron (ZOFRAN) injection 4 mg (4 mg Intravenous Given 05/10/17 0142)  sodium chloride 0.9 % bolus 1,000 mL (0 mLs Intravenous Stopped 05/10/17 0455)  fentaNYL (SUBLIMAZE) injection 50 mcg (50 mcg Intravenous Given 05/10/17 0448)  iopamidol (ISOVUE-300) 61 % injection 100 mL (80 mLs Intravenous Contrast Given 05/10/17 0333)  amoxicillin-clavulanate (AUGMENTIN) 875-125 MG per tablet 1 tablet (1 tablet Oral Given 05/10/17 0448)  HYDROcodone-acetaminophen (NORCO/VICODIN) 5-325 MG per tablet 1 tablet (1 tablet Oral Given 05/10/17 0448)     Initial Impression / Assessment and Plan / ED Course  I have reviewed the triage vital signs and the nursing notes.  Pertinent labs results that were available during my care of the patient were reviewed by me and considered in my medical decision making (see chart for details).     Pt found to have recurrent diverticulitis He recently completed course  of cipro/flagyl and it has returned He reports continued LLQ pain despite multiple rounds of meds Will admit for pain control and continue IV antibiotics IV unasyn ordered   Final Clinical Impressions(s) / ED Diagnoses   Final diagnoses:  Sigmoid diverticulitis    New Prescriptions New Prescriptions   No medications on file  I personally performed the services described in this documentation, which was scribed in my presence. The recorded information has been reviewed and is accurate.        Ripley Fraise, MD 05/10/17 [phone]

## 2017-05-10 NOTE — Progress Notes (Signed)
Hypoglycemic Event  CBG: 68  Treatment: 15 GM carbohydrate snack  Symptoms: None  Follow-up CBG: Time: 2218 CBG Result: 55 Possible Reasons for Event: Inadequate meal intake  Comments/MD notified:Schorr NP- changed IVF to D5 0.45% NS    Duanne Limerick Layne

## 2017-05-10 NOTE — Consult Note (Signed)
Referring Provider: Dr. Charlies Silvers Primary Care Physician:  Carlena Hurl, PA-C Primary Gastroenterologist:  Dr. Havery Moros  Reason for Consultation:  Diverticulitis  HPI: Brandon [name] is a 56 y.o. 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 He presented to ED with left sided abdominal pain.  Had office visit with [place] on 04/13/2017 for LLQ abdominal pain, was given Cipro and flagyl for 10 days after having a CT scan suggesting mid descending colonic diverticulitis. He was supposed to have colonoscopy in in 6 weeks from that visit, which is currently scheduled for 6/21 with Dr. Havery Moros.  He is known to Dr. Havery Moros for 2 attempted colonoscopies, one in 2017, and one already this year.  Both of those had to be aborted due to poor prep and large amount of retained stool in the colon.   ED Course: In ED, pt was hemodynamically stable. Blood work showed Cr on 1.6 (baseline Cr 1.28). Blood glucose was 333. CT abdomen was concerning for acute sigmoid diverticulitis. He was started on IV Unasyn.  He tells me that he only completed the Cipro and Flagyl about one week ago, but he was supposed to start it on 5/10 with only a 10 day course so should have been completed around 5/20.  ? If he obtained and started it right away or according to instructions (he says that he did but it does not really add up).  Anyway, he says that the pain never really went away with that treatment.  It did get better, but then progressively worsened again.  Much worse over the past 2-3 days, which is why he came back to the ED.  Some nausea, but no vomiting.  No fevers or chills but just "can't get warm".  Decreased appetite.   Past Medical History:  Diagnosis Date  . Acute ischemic stroke ([place]) 11/2013  . Anxiety    . Arthritis   . 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 Depression    BH Adm in [place] Depression  . Diabetes mellitus    DKA prior hospitalization  . Diverticulitis    s/p micorperforation Sept 2012-managed conservatively by Gen surgery  . ED (erectile dysfunction)   . Focal seizure ([place]) 11/2013   due to ischemic stroke  . Hiatal hernia   . Hyperlipidemia   . Hypertension   . Kidney tumor 09/2011   Renal cell CA  . Seizures ([place])    none since 2016, taking Keppra - maw  . Thyroid disease    "weak thyroid" per MD  . Wears glasses     Past Surgical History:  Procedure Laterality Date  . COLONOSCOPY    . KIDNEY SURGERY     ablation of renal cell CA - 12/28, prior one was October 2012-Dr. Kathlene Cote  . TEE WITHOUT CARDIOVERSION N/A 11/19/2013   Procedure: TRANSESOPHAGEAL ECHOCARDIOGRAM (TEE);  Surgeon: Lelon Perla, MD;  Location: [facility] ENDOSCOPY;  Service: Cardiovascular;  Laterality: N/A;    Prior to Admission medications   Medication Sig Start Date End Date Taking? Authorizing Provider  aspirin 325 MG tablet Take 325 mg by mouth daily.    Yes [provider]  atorvastatin (LIPITOR) 40 MG tablet Take 1 tablet (40 mg total) by mouth daily. 11/04/16  Yes Tysinger, Camelia Eng, PA-C  Insulin Glargine (LANTUS) 100 UNIT/ML Solostar Pen Inject 45 Units into the skin daily at 10 pm. 04/05/17  Yes Tysinger, Camelia Eng, PA-C  levETIRAcetam (KEPPRA) 500 MG tablet 1/2 tablet po BID Patient taking differently: Take 250 mg by mouth daily.  11/04/16  Yes Tysinger, Camelia Eng, PA-C  lisinopril (PRINIVIL,ZESTRIL) 20 MG tablet Take 1 tablet (20 mg total) by mouth daily. 11/04/16  Yes Tysinger, Camelia Eng, PA-C  ondansetron (ZOFRAN) 4 MG tablet Take 1 tablet (4 mg total) by mouth every 8 (eight) hours as needed for nausea or vomiting. 03/16/17  Yes Tysinger, Camelia Eng, PA-C  polyethylene glycol powder (GLYCOLAX/MIRALAX) powder 1 cap full daily 02/16/17  Yes Tysinger,  Camelia Eng, PA-C  topiramate (TOPAMAX) 50 MG tablet Take 1 tablet (50 mg total) by mouth 2 (two) times daily. 02/02/17  Yes Garvin Fila, MD  blood glucose meter kit and supplies Dispense based on patient and insurance preference. Use up to four times daily as directed. (FOR ICD-9 250.00, 250.01). 04/24/16   Eugenie Filler, MD  Exenatide ER 2 MG PEN Inject 2 mg into the skin. 02/27/17   [provider]  Insulin Pen Needle (PEN NEEDLES) 32G X 4 MM MISC 1 each by Does not apply route daily. Use for insulin pens 05/24/16   Tysinger, Camelia Eng, PA-C  Na Sulfate-K Sulfate-Mg Sulf 17.5-3.13-1.6 GM/180ML SOLN Take 1 kit by mouth as directed. Patient not taking: Reported on 05/10/2017 04/13/17   Loralie Champagne, PA-C    Current Facility-Administered Medications  Medication Dose Route Frequency Provider Last Rate Last Dose  . 0.9 %  sodium chloride infusion   Intravenous Continuous Robbie Lis, MD 75 mL/hr at 05/10/17 0756    . acetaminophen (TYLENOL) tablet 650 mg  650 mg Oral Q6H PRN Robbie Lis, MD       Or  . acetaminophen (TYLENOL) suppository 650 mg  650 mg Rectal Q6H PRN Robbie Lis, MD      . Ampicillin-Sulbactam (UNASYN) 3 g in sodium chloride 0.9 % 100 mL IVPB  3 g Intravenous Q6H Green, Terri L, RPH      . aspirin tablet 325 mg  325 mg Oral Daily Robbie Lis, MD      . atorvastatin (LIPITOR) tablet 40 mg  40 mg Oral Daily Robbie Lis, MD      . fentaNYL (SUBLIMAZE) injection 50 mcg  50 mcg Intravenous Q3H PRN Robbie Lis, MD      . insulin aspart (novoLOG) injection 0-15 Units  0-15 Units Subcutaneous TID WC Robbie Lis, MD      . insulin aspart (novoLOG) injection 0-5 Units  0-5 Units Subcutaneous QHS Robbie Lis, MD      . insulin glargine (LANTUS) injection 25 Units  25 Units Subcutaneous Daily Robbie Lis, MD      . levETIRAcetam (KEPPRA) 250 mg in sodium chloride 0.9 % 100 mL IVPB  250 mg Intravenous Q24H Robbie Lis, MD      . ondansetron [facility])  tablet 4 mg  4 mg Oral Q6H PRN Robbie Lis, MD       Or  . ondansetron [facility]) injection 4 mg  4 mg Intravenous Q6H PRN Robbie Lis, MD      . topiramate (TOPAMAX) tablet 50 mg  50 mg Oral BID Robbie Lis, MD        Allergies as of 05/09/2017 - Review Complete 05/09/2017  Allergen Reaction Noted  .  Morphine Itching 12/13/2016    Family History  Problem Relation Age of Onset  . Diabetes Father   . Prostate cancer Other   . Stomach cancer Other   . Colon cancer Neg Hx   . Esophageal cancer Neg Hx   . Rectal cancer Neg Hx     Social History   Social History  . Marital status: Single    Spouse name: N/A  . Number of children: 2  . Years of education: college   Occupational History  .  Unemployed   Social History Main Topics  . Smoking status: Never Smoker  . Smokeless tobacco: Never Used  . Alcohol use 1.2 oz/week    2 Cans of beer per week     Comment: one weekly  . Drug use: No  . Sexual activity: No   Other Topics Concern  . Not on file   Social History Narrative   Patient lives at home with his family.   Education. Two years of college.   Not working.   Right handed.   Caffeine one soda daily. Drinks coffee     Review of Systems: ROS is O/W negative except as mentioned in HPI.  Physical Exam: Vital signs in last 24 hours: Temp:  [97.7 F (36.5 C)-98.4 F (36.9 C)] 97.7 F (36.5 C) (06/06 0737) Pulse Rate:  [66-92] 69 (06/06 0737) Resp:  [18-20] 18 (06/06 0737) BP: (101-127)/(65-85) 118/80 (06/06 0737) SpO2:  [97 %-100 %] 100 % (06/06 0737) Weight:  [263 lb 7.2 oz (119.5 kg)] 263 lb 7.2 oz (119.5 kg) (06/06 0737)   General:  Alert, Well-developed, well-nourished, in NAD Head:  Normocephalic and atraumatic. Eyes:  Sclera clear, no icterus.  Conjunctiva pink. Ears:  Normal auditory acuity. Mouth:  No deformity or lesions.   Lungs:  Clear throughout to auscultation.  No wheezes, crackles, or rhonchi.  No increased WOB. Heart:  Regular  rate and rhythm; no murmurs, clicks, rubs, or gallops. Abdomen:  Soft, non-distended.  BS present.  TTP on the left.   Rectal:  Deferred  Msk:  Symmetrical without gross deformities. Pulses:  Normal pulses noted. Extremities:  Without clubbing or edema. Neurologic:  Alert and oriented x 4;  grossly normal neurologically. Skin:  Intact without significant lesions or rashes.  Lab Results:  Recent Labs  05/09/17 2347  WBC 6.0  HGB 12.0*  HCT 33.9*  PLT 200   BMET  Recent Labs  05/09/17 2347  NA 136  K 3.7  CL 102  CO2 23  GLUCOSE 333*  BUN 23*  CREATININE 1.67*  CALCIUM 9.2   LFT  Recent Labs  05/09/17 2347  PROT 7.3  ALBUMIN 4.2  AST 19  ALT 21  ALKPHOS 97  BILITOT 1.6*    Studies/Results: Ct Abdomen Pelvis W Contrast  Result Date: 05/10/2017 CLINICAL DATA:  Left lower quadrant pain. EXAM: CT ABDOMEN AND PELVIS WITH CONTRAST TECHNIQUE: Multidetector CT imaging of the abdomen and pelvis was performed using the standard protocol following bolus administration of intravenous contrast. CONTRAST:  80 cc Isovue-300 IV COMPARISON:  Renal protocol CT 04/04/2017 FINDINGS: Lower chest: No consolidation or pleural fluid. Hepatobiliary: No focal liver abnormality is seen. No gallstones, gallbladder wall thickening, or biliary dilatation. Pancreas: No ductal dilatation or inflammation. Spleen: Normal in size without focal abnormality. Adrenals/Urinary Tract: No adrenal nodule. Unchanged size and appearance of bilateral cryoablation defects. Small 9 mm subcapsular hypodense lesion in the lower right kidney is unchanged. No hydronephrosis or perinephric edema. There  is symmetric renal excretion on delayed phase imaging. Urinary bladder is minimally distended, question bladder wall thickening. Stomach/Bowel: Mild pericolonic soft tissue edema at the junction of the descending and proximal sigmoid colon in the region of multiple colonic diverticula, suspect mild diverticulitis. This is  more distal than the region of previous inflammation in the mid descending colon, that prior inflammation has resolved. Multiple additional noninflamed diverticula in the distal colon. Normal appendix. Small hiatal hernia. No small bowel dilatation. Vascular/Lymphatic: Aortic atherosclerosis without aneurysm. No retroperitoneal adenopathy. Reproductive: Prostate is unremarkable. Other: Minimal fat within both inguinal canals. No free air, free fluid, or intra-abdominal fluid collection. Musculoskeletal: Stable degenerative change throughout the spine and both hips. Partial ankylosis of both sacroiliac joints. IMPRESSION: 1. Mild acute diverticulitis at the junction of the descending and sigmoid colon. No perforation or abscess. 2. Bladder wall thickening may be secondary to nondistention or cystitis. 3. Unchanged appearance of bilateral renal cryoablation defects. 4. Aortic atherosclerosis. Electronically Signed   By: Jeb Levering M.D.   On: 05/10/2017 04:05   IMPRESSION:  -56 year old male with history of diverticulitis with microperforation in 2012.  Recently treated with cipro and flagyl after having a CT scan suggesting mid descending colonic diverticulitis in May.  Now admitted with CT scan showing mild acute diverticulitis at the junction of the descending and sigmoid colon.  PLAN: -Agree with IV antibiotics, Unasyn for now.  ? To be discharged on Cipro and Flagyl vs Augmentin to complete a 14 day course. -Clear liquid diet and advance as tolerated. -He is scheduled for colonoscopy on 6/21 with Dr. Havery Moros, and that is going to be rescheduled as it will not be safe to perform it at the current scheduled time.  He has actually been rescheduled to 7/12 at 10:00 AM and we instructions will be mailed to him.   ZEHR, JESSICA D.  05/10/2017, 9:08 AM  Pager number [phone]    Attending physician's note   I have taken a history, examined the patient and reviewed the chart. I agree with the  Advanced Practitioner's note, impression and recommendations. 23 yr M admitted with recurrent diverticulitis after being recently treated for mild diverticulitis last month with PO Cipro and flagyl. He is s/p CVA, CKD, Renal Ca s/p ablation. He has chronic left sided abdominal pain after the renal ablation. On IV Unasyn Slowly advance diet as tolerated Can consider switching to PO Augmentin if symptoms improving Follow up in office with Dr Havery Moros in 1-2 weeks and plan for colonoscopy in 4-6 weeks   K Denzil Magnuson, MD [phone] Mon-Fri 8a-5p [phone] after 5p, weekends, holidays

## 2017-05-10 NOTE — Progress Notes (Signed)
Pharmacy Antibiotic Follow-up Note  Brandon [name] is a 56 y.o. year-old male admitted on 05/09/2017.  The patient is currently on day 1 of Unasyn for diverticulitis.  Assessment/Plan: Unasyn 3gm q6hr  Temp (24hrs), Avg:98.2 F (36.8 C), Min:97.9 F (36.6 C), Max:98.4 F (36.9 C)   Recent Labs Lab 05/09/17 2347  WBC 6.0    Recent Labs Lab 05/09/17 2347  CREATININE 1.67*   CrCl cannot be calculated (Unknown ideal weight.).    Allergies  Allergen Reactions  . Morphine Itching    Antimicrobials this admission: 6/6 Unasyn >>   Microbiology results: None ordered  Thank you for allowing pharmacy to be a part of this patient's care.  Minda Ditto PharmD Pager [phone] 05/10/2017, 8:25 AM

## 2017-05-10 NOTE — ED Notes (Signed)
Pt verbalized understanding of NPO status.

## 2017-05-10 NOTE — Telephone Encounter (Signed)
I reviewed patient's CPAP compliance data from 04/05/2017 through 05/04/2017 which is a total of 30 days, during which time he used his CPAP only 11 days with percent used days greater than 4 hours at 3% only, indicating poor compliance/noncompliance. AHI is elevated, leak is high. Please ask patient to get in touch with his DME company regarding mask refit and to also discuss other ways to be more compliant with CPAP therapy. Per insurance requirements he has to be at least 70% compliant with in the first 90 days of treatment onset. Please remind patient of this. Of note, it looks like he presented to the emergency room recently for diverticulitis concern.

## 2017-05-10 NOTE — H&P (Addendum)
History and Physical    DEMONTRE PADIN FWY:[id number] DOB: [birth year] DOA: 05/09/2017  Referring MD/NP/PA: Dr. Christy Gentles   PCP: Glade Lloyd Camelia Eng, PA-C   Outpatient Specialists: GI, PA Zehr Janett Billow and Dr. Havery Moros  Patient coming from: home  Chief Complaint: left side abdominal pain  HPI: Brandon [name] is a 56 y.o. male with medical history significant for hypertension, insulin dependent DM, dyslipidemia, CKD stage 3 with baseline Cr 1.28 in 07/2016. 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 He presented to ED with worsening left upper and lower quadrant abdominal pain for past few days prior to this admission, intermittent, non radiating, 10/10 in intensity at worst. His appetite diminished due to the abdominal pain. He also has associated nausea but no vomiting. No fevers, no diarrhea. Patient reported chills. Pain was better with fentanyl given in ED but it has not completely subsided. No blood in stool. No reports of chest pain, shortness of breath or palpitations.   Visit to GI office 04/13/2017 - for LLQ abdominal pain, was given Cipro and flagyl for 10 days. He was supposed to have colonoscopy in in 6 weeks from that visit.  ED Course: In ED, pt was hemodynamically stable. Blood work showed Cr on 1.6 (baseline Cr 1.28). Blood glucose was 333. CT abdomen was concerning for acute sigmoid diverticulitis. He was started on IV Unasyn.  Review of Systems:  Constitutional: Negative for fever, chills, diaphoresis, activity change, appetite change and fatigue.  HENT: Negative for ear pain, nosebleeds, congestion, facial swelling, rhinorrhea, neck pain, neck stiffness and ear discharge.   Eyes: Negative for pain, discharge, redness, itching and visual disturbance.  Respiratory: Negative for cough, choking, chest tightness, shortness of breath, wheezing and stridor.   Cardiovascular:  Negative for chest pain, palpitations and leg swelling.  Gastrointestinal: Negative for abdominal distention. Positive for abdominal pain in left upper and lower quadrant Genitourinary: Negative for dysuria, urgency, frequency, hematuria, flank pain, decreased urine volume, difficulty urinating and dyspareunia.  Musculoskeletal: Negative for back pain, joint swelling, arthralgias and gait problem.  Neurological: Negative for dizziness, tremors, seizures, syncope, facial asymmetry, speech difficulty, weakness, light-headedness, numbness and headaches.  Hematological: Negative for adenopathy. Does not bruise/bleed easily.  Psychiatric/Behavioral: Negative for hallucinations, behavioral problems, confusion, dysphoric mood, decreased concentration and agitation.   Past Medical History:  Diagnosis Date  . Acute ischemic stroke ([place]) 11/2013  . Anxiety   . Arthritis   . 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 Depression    BH Adm in [place] Depression  . Diabetes mellitus    DKA prior hospitalization  . Diverticulitis    s/p micorperforation Sept 2012-managed conservatively by Gen surgery  . ED (erectile dysfunction)   . Focal seizure ([place]) 11/2013   due to ischemic stroke  . Hiatal hernia   . Hyperlipidemia   . Hypertension   . Kidney tumor 09/2011   Renal cell CA  . Seizures ([place])    none since 2016, taking Keppra - maw  . Thyroid disease    "weak thyroid" per MD  . Wears glasses     Past Surgical History:  Procedure Laterality Date  . COLONOSCOPY    . KIDNEY SURGERY     ablation of renal cell CA - 12/28, prior one was October 2012-Dr. Kathlene Cote  . TEE WITHOUT CARDIOVERSION N/A 11/19/2013   Procedure: TRANSESOPHAGEAL ECHOCARDIOGRAM (TEE);  Surgeon: Lelon Perla, MD;  Location: [facility] ENDOSCOPY;  Service: Cardiovascular;  Laterality: N/A;    Social history:  reports that he has never smoked. He has never used smokeless tobacco. He reports  that he drinks about 1.2 oz of alcohol per week . He reports that he does not use drugs.  Ambulation: ambulates without assistance at baseline.  Allergies  Allergen Reactions  . Morphine Itching    Family History  Problem Relation Age of Onset  . Diabetes Father   . Prostate cancer Other   . Stomach cancer Other   . Colon cancer Neg Hx   . Esophageal cancer Neg Hx   . Rectal cancer Neg Hx     Prior to Admission medications   Medication Sig Start Date End Date Taking? Authorizing Provider  aspirin 325 MG tablet Take 325 mg by mouth daily.    Yes [provider]  atorvastatin (LIPITOR) 40 MG tablet Take 1 tablet (40 mg total) by mouth daily. 11/04/16  Yes Tysinger, Camelia Eng, PA-C  Insulin Glargine (LANTUS) 100 UNIT/ML Solostar Pen Inject 45 Units into the skin daily at 10 pm. 04/05/17  Yes Tysinger, Camelia Eng, PA-C  levETIRAcetam (KEPPRA) 500 MG tablet 1/2 tablet po BID Patient taking differently: Take 250 mg by mouth daily.  11/04/16  Yes Tysinger, Camelia Eng, PA-C  lisinopril (PRINIVIL,ZESTRIL) 20 MG tablet Take 1 tablet (20 mg total) by mouth daily. 11/04/16  Yes Tysinger, Camelia Eng, PA-C  ondansetron (ZOFRAN) 4 MG tablet Take 1 tablet (4 mg total) by mouth every 8 (eight) hours as needed for nausea or vomiting. 03/16/17  Yes Tysinger, Camelia Eng, PA-C  polyethylene glycol powder (GLYCOLAX/MIRALAX) powder 1 cap full daily 02/16/17  Yes Tysinger, Camelia Eng, PA-C  topiramate (TOPAMAX) 50 MG tablet Take 1 tablet (50 mg total) by mouth 2 (two) times daily. 02/02/17  Yes Garvin Fila, MD  blood glucose meter kit and supplies Dispense based on patient and insurance preference. Use up to four times daily as directed. (FOR ICD-9 250.00, 250.01). 04/24/16   Eugenie Filler, MD  Exenatide ER 2 MG PEN Inject 2 mg into the skin. 02/27/17   [provider]  Insulin Pen Needle (PEN NEEDLES) 32G X 4 MM MISC 1 each by Does not apply route daily. Use for insulin pens 05/24/16   Tysinger, Camelia Eng, PA-C   Na Sulfate-K Sulfate-Mg Sulf 17.5-3.13-1.6 GM/180ML SOLN Take 1 kit by mouth as directed. Patient not taking: Reported on 05/10/2017 04/13/17   Loralie Champagne, PA-C    Physical Exam: Vitals:   05/09/17 2314 05/10/17 0137 05/10/17 0359 05/10/17 0626  BP: 101/84 106/65 127/85 125/83  Pulse: 92 66 66 68  Resp: _0 Temp: 98.4 F (36.9 C) 97.9 F (36.6 C)    TempSrc: Oral Oral    SpO2: 99% 97% 100% 98%    Constitutional: NAD, calm, comfortable Vitals:   05/09/17 2314 05/10/17 0137 05/10/17 0359 05/10/17 0626  BP: 101/84 106/65 127/85 125/83  Pulse: 92 66 66 68  Resp: _1 Temp: 98.4 F (36.9 C) 97.9 F (36.6 C)    TempSrc: Oral Oral    SpO2: 99% 97% 100% 98%   Eyes: PERRL, lids and conjunctivae normal ENMT: Mucous membranes are moist. Posterior pharynx clear of any exudate or lesions.Normal dentition.  Neck: normal, supple, no masses, no thyromegaly Respiratory: clear to auscultation bilaterally, no wheezing, no crackles. Normal respiratory effort. No accessory muscle use.  Cardiovascular: Regular rate and rhythm, no murmurs / rubs / gallops. No extremity edema. 2+ pedal pulses. No carotid bruits.  Abdomen: tender in left upper and lower abdominal quadrant, (+) BS, no rebound tenderness  Musculoskeletal: no clubbing or cyanosis. No joint deformity upper and lower extremities. Good ROM, no contractures. Normal muscle tone.  Skin: no rashes, lesions, ulcers. No induration Neurologic: CN 2-12 grossly intact. Sensation intact, DTR normal. Left leg weakness  Psychiatric: Normal judgment and insight. Alert and oriented x 3. Normal mood.   Labs on Admission: I have personally reviewed following labs and imaging studies  CBC:  Recent Labs Lab 05/09/17 2347  WBC 6.0  HGB 12.0*  HCT 33.9*  MCV 83.7  PLT 027   Basic Metabolic Panel:  Recent Labs Lab 05/09/17 2347  NA 136  K 3.7  CL 102  CO2 23  GLUCOSE 333*  BUN 23*  CREATININE 1.67*  CALCIUM 9.2    GFR: CrCl cannot be calculated (Unknown ideal weight.). Liver Function Tests:  Recent Labs Lab 05/09/17 2347  AST 19  ALT 21  ALKPHOS 97  BILITOT 1.6*  PROT 7.3  ALBUMIN 4.2    Recent Labs Lab 05/09/17 2347  LIPASE 25   No results for input(s): AMMONIA in the last 168 hours. Coagulation Profile: No results for input(s): INR, PROTIME in the last 168 hours. Cardiac Enzymes: No results for input(s): CKTOTAL, CKMB, CKMBINDEX, TROPONINI in the last 168 hours. BNP (last 3 results) No results for input(s): PROBNP in the last 8760 hours. HbA1C: No results for input(s): HGBA1C in the last 72 hours. CBG: No results for input(s): GLUCAP in the last 168 hours. Lipid Profile: No results for input(s): CHOL, HDL, LDLCALC, TRIG, CHOLHDL, LDLDIRECT in the last 72 hours. Thyroid Function Tests: No results for input(s): TSH, T4TOTAL, FREET4, T3FREE, THYROIDAB in the last 72 hours. Anemia Panel: No results for input(s): VITAMINB12, FOLATE, FERRITIN, TIBC, IRON, RETICCTPCT in the last 72 hours. Urine analysis:    Component Value Date/Time   COLORURINE YELLOW 05/10/2017 0022   APPEARANCEUR CLEAR 05/10/2017 0022   LABSPEC 1.012 05/10/2017 0022   PHURINE 5.0 05/10/2017 0022   GLUCOSEU 150 (A) 05/10/2017 0022   HGBUR SMALL (A) 05/10/2017 0022   BILIRUBINUR NEGATIVE 05/10/2017 0022   BILIRUBINUR neg 11/04/2016 1619   KETONESUR NEGATIVE 05/10/2017 0022   PROTEINUR NEGATIVE 05/10/2017 0022   UROBILINOGEN negative 11/04/2016 1619   UROBILINOGEN 1.0 12/13/2014 1029   NITRITE NEGATIVE 05/10/2017 0022   LEUKOCYTESUR TRACE (A) 05/10/2017 0022   Sepsis Labs: _0 (procalcitonin:4,lacticidven:4) )No results found for this or any previous visit (from the past 240 hour(s)).   Radiological Exams on Admission: Ct Abdomen Pelvis W Contrast Result Date: 05/10/2017 1. Mild acute diverticulitis at the junction of the descending and sigmoid colon. No perforation or abscess. 2. Bladder wall  thickening may be secondary to nondistention or cystitis. 3. Unchanged appearance of bilateral renal cryoablation defects. 4. Aortic atherosclerosis.    EKG: sinus rhythm, 60 bmp  Assessment/Plan  Principal Problem:   Acute diverticulitis of intestine / Abdominal pain - Pt presented with abdominal pain in left upper and lower abdominal quadrant - Acute mild diverticulitis seen on CT abdomen in the descending and sigmoid colon area - He has history of diverticulosis and was seen in GI office 5/10 for abdominal pain and was given Cipro and flagyl for 10 days - His pain is persistent and warrant further evaluation - He was supposed to have colonoscopy in 6 weeks from 5/10  so will follow up on GI recommendations during this hospital stay - Fot now continue Unasyn, started in ED - Continue NPO and hydration with IV fluids - Continue pain management with fentanyl as pt has allergies to morphine   Active Problems:   Acute renal failure superimposed on stage 3 chronic kidney disease (HCC) - Baseline creatinine is 1.28 on 07/26/2016 - Creatinine on this admission is 1.6, elevated likely due to Lisinopril which was placed on hold - Follow up BMP in am    History of seizure - Resume Keppra 250 mg daily IV - Resumed Topamax    Diabetes mellitus with diabetic nephropathy, with long-term current use of insulin (HCC) - Check A1c - Since NPO use SSI for now and use about half on his Lantus regimen (he is on 45 units so will use 25 units daily)    Dyslipidemia associated with type 2 diabetes mellitus (HCC) - Resumed Lipitor    Essential hypertension - Lisinopril on hold due to CRI - BP stable without antihypertensives     History of cerebrovascular accident (CVA) with residual deficit - Resumed Lipitor and aspirin     Morbid obesity due to excess calories - Counseled on diet and nutrition     DVT prophylaxis: SCD's bilaterally  Code Status: full code Family Communication: no family at  the bedside  Disposition Plan: admission to medical floor  Consults called: [facility] Admission status: inpatient, pt essentially failed outpt treatment as he was on cipro and flagyl for abdominal pain earlier in may and his abd pain is persistent. He is on IV Unasyn and GI was consulted.   Leisa Lenz MD [facility] Pager [phone]  If 7PM-7AM, please contact night-coverage [email or web address] Password [email or web address]  05/10/2017, 7:19 AM

## 2017-05-11 ENCOUNTER — Ambulatory Visit: Payer: Medicaid Other | Admitting: Nurse Practitioner

## 2017-05-11 ENCOUNTER — Telehealth: Payer: Self-pay

## 2017-05-11 ENCOUNTER — Telehealth: Payer: Self-pay | Admitting: Nurse Practitioner

## 2017-05-11 LAB — BASIC METABOLIC PANEL
Anion gap: 9 (ref 5–15)
BUN: 16 mg/dL (ref 6–20)
CALCIUM: 9 mg/dL (ref 8.9–10.3)
CO2: 26 mmol/L (ref 22–32)
CREATININE: 0.98 mg/dL (ref 0.61–1.24)
Chloride: 106 mmol/L (ref 101–111)
Glucose, Bld: 57 mg/dL — ABNORMAL LOW (ref 65–99)
Potassium: 4 mmol/L (ref 3.5–5.1)
SODIUM: 141 mmol/L (ref 135–145)

## 2017-05-11 LAB — GLUCOSE, CAPILLARY
GLUCOSE-CAPILLARY: 63 mg/dL — AB (ref 65–99)
GLUCOSE-CAPILLARY: 81 mg/dL (ref 65–99)
GLUCOSE-CAPILLARY: 77 mg/dL (ref 65–99)
GLUCOSE-CAPILLARY: 64 mg/dL — AB (ref 65–99)
Glucose-Capillary: 71 mg/dL (ref 65–99)
Glucose-Capillary: 78 mg/dL (ref 65–99)
Glucose-Capillary: 99 mg/dL (ref 65–99)

## 2017-05-11 LAB — CBC
HCT: 36 % — ABNORMAL LOW (ref 39.0–52.0)
Hemoglobin: 12.6 g/dL — ABNORMAL LOW (ref 13.0–17.0)
MCH: 29.7 pg (ref 26.0–34.0)
MCHC: 35 g/dL (ref 30.0–36.0)
MCV: 84.9 fL (ref 78.0–100.0)
PLATELETS: 175 10*3/uL (ref 150–400)
RBC: 4.24 MIL/uL (ref 4.22–5.81)
RDW: 11.8 % (ref 11.5–15.5)
WBC: 5.2 10*3/uL (ref 4.0–10.5)

## 2017-05-11 LAB — HEMOGLOBIN A1C
Hgb A1c MFr Bld: 10.6 % — ABNORMAL HIGH (ref 4.8–5.6)
MEAN PLASMA GLUCOSE: 258 mg/dL

## 2017-05-11 LAB — HIV ANTIBODY (ROUTINE TESTING W REFLEX): HIV SCREEN 4TH GENERATION: NONREACTIVE

## 2017-05-11 NOTE — Telephone Encounter (Signed)
[facility] just called and informed me that Brandon [name] has been admitted in patient to [facility] room 134. They also asked me who his doctor is here and I informed her that he last saw Dr. Rexene Alberts.

## 2017-05-11 NOTE — Telephone Encounter (Signed)
Pt no-showed his appt this a.m.

## 2017-05-11 NOTE — Telephone Encounter (Signed)
Wife called to report that he's currently admitted at [facility].

## 2017-05-11 NOTE — Progress Notes (Signed)
Hypoglycemic Event  CBG: 55  Treatment: 15 GM carbohydrate snack  Symptoms: None  Follow-up CBG: Time:2230 CBG Result:122  Possible Reasons for Event: Inadequate meal intake  Comments/MD notified: [place] NP notified with 1st episode    Duanne Limerick [facility]

## 2017-05-11 NOTE — Progress Notes (Signed)
Patient ID: Brandon [name], male   DOB: 1961/07/23, 56 y.o.   MRN: [id number]    PROGRESS NOTE    Brandon [name]  UXL:[id number] DOB: [birth year] DOA: 05/09/2017  PCP: Carlena Hurl, PA-C   Brief Narrative:  Brandon [name] is 56 yo male with known DM type II, CKD stage III, seizures, depression, presented with left upper and lower quadrants pain several days in duration. Brandon [name] has visited GI doctor 04/13/2017 and was given Cipro and Flagyl due to LLQ pain, was supposed to have colonoscopy in 6 weeks from that point. In ED, imaging studies notable for acute sigmoid diverticulitis.   Assessment & Plan:    Principal Problem:   Acute diverticulitis of intestine / Abdominal pain - Brandon [name] presented with abdominal pain in left upper and lower abdominal quadrant - Acute mild diverticulitis seen on CT abdomen in the descending and sigmoid colon area - Brandon [name] reports feeling better, keep on Unasyn day #2 for now - GI team has seen in consultation and recommends slowly advancing diet, change to PO Augmentin one Brandon [name] able tolerate PO  Active Problems:   Acute renal failure superimposed on stage 3 chronic kidney disease (HCC) - Baseline creatinine is 1.28 on 07/26/2016 - Creatinine on this admission is 1.6, likely combination on dehydration , poor oral intake, Lisinopril  - BMP in AM    History of seizure - keep on Keppra and Topomax     Diabetes mellitus with diabetic nephropathy, with long-term current use of insulin ([place]) - with hypoglycemic events due to poor oral intake - hold lantus - keep on SSI only for now     Dyslipidemia associated with type 2 diabetes mellitus ([place]) - continue Lipitor     Essential hypertension - reasonable control for now    History of cerebrovascular accident (CVA) with residual deficit - Resumed Lipitor and aspirin     Morbid obesity due to excess calories - Counseled on diet and nutrition   DVT prophylaxis: SCD's Code Status: Full  Family Communication: Patient  at bedside  Disposition Plan: to be determined   Consultants:   GI  Procedures:   None  Antimicrobials:   Unasyn 6/6 -->   Subjective: Brandon [name] reports feeling better but still with intermittent pain.   Objective: Vitals:   05/10/17 2100 05/11/17 0443 05/11/17 0703 05/11/17 1326  BP: 125/67 119/77  114/76  Pulse: (!) 59 65  67  Resp: 18 17  16   Temp: 98 F (36.7 C) 97.7 F (36.5 C)  97.8 F (36.6 C)  TempSrc: Oral Oral  Oral  SpO2: 100% 100%  100%  Weight:   124.1 kg (273 lb 9.5 oz)   Height:        Intake/Output Summary (Last 24 hours) at 05/11/17 2048 Last data filed at 05/11/17 1822  Gross per 24 hour  Intake             1540 ml  Output              401 ml  Net             1139 ml   Filed Weights   05/10/17 0737 05/11/17 0703  Weight: 119.5 kg (263 lb 7.2 oz) 124.1 kg (273 lb 9.5 oz)    Examination:  General exam: Appears calm and comfortable  Respiratory system: Clear to auscultation. Respiratory effort normal. Cardiovascular system: S1 & S2 heard, RRR. No JVD, murmurs, rubs, gallops or clicks. No pedal edema. Gastrointestinal system: Abdomen is  nondistended, soft, TTP in lower abd quadrants  Central nervous system: Alert and oriented. No focal neurological deficits. Extremities: Symmetric 5 x 5 power. Skin: No rashes, lesions or ulcers Psychiatry: Judgement and insight appear normal. Mood & affect appropriate.    Data Reviewed: I have personally reviewed following labs and imaging studies  CBC:  Recent Labs Lab 05/09/17 2347 05/11/17 0407  WBC 6.0 5.2  HGB 12.0* 12.6*  HCT 33.9* 36.0*  MCV 83.7 84.9  PLT 200 185   Basic Metabolic Panel:  Recent Labs Lab 05/09/17 2347 05/11/17 0407  NA 136 141  K 3.7 4.0  CL 102 106  CO2 23 26  GLUCOSE 333* 57*  BUN 23* 16  CREATININE 1.67* 0.98  CALCIUM 9.2 9.0   Liver Function Tests:  Recent Labs Lab 05/09/17 2347  AST 19  ALT 21  ALKPHOS 97  BILITOT 1.6*  PROT 7.3  ALBUMIN 4.2     Recent Labs Lab 05/09/17 2347  LIPASE 25   HbA1C:  Recent Labs  05/10/17 0805  HGBA1C 10.6*   CBG:  Recent Labs Lab 05/10/17 2330 05/11/17 0732 05/11/17 0751 05/11/17 1127 05/11/17 1656  GLUCAP 122* 63* 71 81 77   Urine analysis:    Component Value Date/Time   COLORURINE YELLOW 05/10/2017 0022   APPEARANCEUR CLEAR 05/10/2017 0022   LABSPEC 1.012 05/10/2017 0022   PHURINE 5.0 05/10/2017 0022   GLUCOSEU 150 (A) 05/10/2017 0022   HGBUR SMALL (A) 05/10/2017 0022   BILIRUBINUR NEGATIVE 05/10/2017 0022   BILIRUBINUR neg 11/04/2016 1619   KETONESUR NEGATIVE 05/10/2017 0022   PROTEINUR NEGATIVE 05/10/2017 0022   UROBILINOGEN negative 11/04/2016 1619   UROBILINOGEN 1.0 12/13/2014 1029   NITRITE NEGATIVE 05/10/2017 0022   LEUKOCYTESUR TRACE (A) 05/10/2017 0022   Radiology Studies: Ct Abdomen Pelvis W Contrast  Result Date: 05/10/2017 CLINICAL DATA:  Left lower quadrant pain. EXAM: CT ABDOMEN AND PELVIS WITH CONTRAST TECHNIQUE: Multidetector CT imaging of the abdomen and pelvis was performed using the standard protocol following bolus administration of intravenous contrast. CONTRAST:  80 cc Isovue-300 IV COMPARISON:  Renal protocol CT 04/04/2017 FINDINGS: Lower chest: No consolidation or pleural fluid. Hepatobiliary: No focal liver abnormality is seen. No gallstones, gallbladder wall thickening, or biliary dilatation. Pancreas: No ductal dilatation or inflammation. Spleen: Normal in size without focal abnormality. Adrenals/Urinary Tract: No adrenal nodule. Unchanged size and appearance of bilateral cryoablation defects. Small 9 mm subcapsular hypodense lesion in the lower right kidney is unchanged. No hydronephrosis or perinephric edema. There is symmetric renal excretion on delayed phase imaging. Urinary bladder is minimally distended, question bladder wall thickening. Stomach/Bowel: Mild pericolonic soft tissue edema at the junction of the descending and proximal sigmoid colon  in the region of multiple colonic diverticula, suspect mild diverticulitis. This is more distal than the region of previous inflammation in the mid descending colon, that prior inflammation has resolved. Multiple additional noninflamed diverticula in the distal colon. Normal appendix. Small hiatal hernia. No small bowel dilatation. Vascular/Lymphatic: Aortic atherosclerosis without aneurysm. No retroperitoneal adenopathy. Reproductive: Prostate is unremarkable. Other: Minimal fat within both inguinal canals. No free air, free fluid, or intra-abdominal fluid collection. Musculoskeletal: Stable degenerative change throughout the spine and both hips. Partial ankylosis of both sacroiliac joints. IMPRESSION: 1. Mild acute diverticulitis at the junction of the descending and sigmoid colon. No perforation or abscess. 2. Bladder wall thickening may be secondary to nondistention or cystitis. 3. Unchanged appearance of bilateral renal cryoablation defects. 4. Aortic atherosclerosis. Electronically Signed  By: Jeb Levering M.D.   On: 05/10/2017 04:05      Scheduled Meds: . aspirin  325 mg Oral Daily  . atorvastatin  40 mg Oral Daily  . insulin aspart  0-15 Units Subcutaneous TID WC  . insulin aspart  0-5 Units Subcutaneous QHS  . insulin glargine  25 Units Subcutaneous Daily  . levETIRAcetam  250 mg Oral Daily  . topiramate  50 mg Oral BID   Continuous Infusions: . ampicillin-sulbactam (UNASYN) IV Stopped (05/11/17 1910)  . dextrose 5 % and 0.45% NaCl 75 mL/hr at 05/10/17 2150     LOS: 1 day   Time spent: 35 minutes   Faye Ramsay, MD [facility] Pager [phone]  If 7PM-7AM, please contact night-coverage [email or web address] Password [facility] 05/11/2017, 8:48 PM

## 2017-05-11 NOTE — Progress Notes (Signed)
Hypoglycemic Event  CBG: 64  Treatment: juice  Symptoms: none  Follow-up CBG: Time:2149 CBG Result:78  Possible Reasons for Event: poor po intake due to disease process  Comments/MD notified:[place]anndy   Child psychotherapist, Dorothyann Peng

## 2017-05-12 ENCOUNTER — Encounter: Payer: Self-pay | Admitting: Nurse Practitioner

## 2017-05-12 LAB — CBC
HEMATOCRIT: 33.8 % — AB (ref 39.0–52.0)
HEMOGLOBIN: 11.8 g/dL — AB (ref 13.0–17.0)
MCH: 29.7 pg (ref 26.0–34.0)
MCHC: 34.9 g/dL (ref 30.0–36.0)
MCV: 85.1 fL (ref 78.0–100.0)
PLATELETS: 166 10*3/uL (ref 150–400)
RBC: 3.97 MIL/uL — AB (ref 4.22–5.81)
RDW: 11.8 % (ref 11.5–15.5)
WBC: 4.8 10*3/uL (ref 4.0–10.5)

## 2017-05-12 LAB — BASIC METABOLIC PANEL
ANION GAP: 6 (ref 5–15)
BUN: 11 mg/dL (ref 6–20)
CHLORIDE: 109 mmol/L (ref 101–111)
CO2: 26 mmol/L (ref 22–32)
Calcium: 8.9 mg/dL (ref 8.9–10.3)
Creatinine, Ser: 0.84 mg/dL (ref 0.61–1.24)
GFR calc Af Amer: >60 mL/min (ref >60)
GFR calc non Af Amer: >60 mL/min (ref >60)
GLUCOSE: 116 mg/dL — AB (ref 65–99)
POTASSIUM: 3.3 mmol/L — AB (ref 3.5–5.1)
Sodium: 141 mmol/L (ref 135–145)

## 2017-05-12 LAB — GLUCOSE, CAPILLARY
GLUCOSE-CAPILLARY: 126 mg/dL — AB (ref 65–99)
GLUCOSE-CAPILLARY: 123 mg/dL — AB (ref 65–99)
GLUCOSE-CAPILLARY: 190 mg/dL — AB (ref 65–99)
Glucose-Capillary: 102 mg/dL — ABNORMAL HIGH (ref 65–99)
Glucose-Capillary: 107 mg/dL — ABNORMAL HIGH (ref 65–99)

## 2017-05-12 MED ORDER — POTASSIUM CHLORIDE CRYS ER 20 MEQ PO TBCR
40.0000 meq | EXTENDED_RELEASE_TABLET | Freq: Once | ORAL | Status: AC
  Administered 2017-05-12: 40 meq via ORAL
  Filled 2017-05-12: qty 2

## 2017-05-12 MED ORDER — HYDROMORPHONE HCL 1 MG/ML IJ SOLN
1.0000 mg | INTRAMUSCULAR | Status: DC | PRN
  Administered 2017-05-12 – 2017-05-15 (×13): 1 mg via INTRAVENOUS
  Filled 2017-05-12 (×13): qty 1

## 2017-05-12 MED ORDER — OXYCODONE HCL 5 MG PO TABS: 5.0000 mg | ORAL_TABLET | ORAL | Status: DC | PRN

## 2017-05-12 NOTE — Progress Notes (Signed)
Pharmacy Antibiotic Note  Brandon [name] is a 56 y.o. male admitted on 05/09/2017 with diverticulitis.  Pharmacy has been consulted for Unasyn dosing.  Today is day 3 unasyn. Patient remains afebrile with WBC within normal limits. Renal function has improved. Nausea continues, plan to change to augmentin once patient tolerating orals ger GI.   Plan: Continue Unasyn 3gm IV every 6 hours Monitor renal function  F/U ability to tolerate diet/orals  Height: 6' (182.9 cm) Weight: 273 lb 9.5 oz (124.1 kg) IBW/kg (Calculated) : 77.6  Temp (24hrs), Avg:97.9 F (36.6 C), Min:97.8 F (36.6 C), Max:98 F (36.7 C)   Recent Labs Lab 05/09/17 2347 05/11/17 0407 05/12/17 0410  WBC 6.0 5.2 4.8  CREATININE 1.67* 0.98 0.84    Estimated Creatinine Clearance: 136.8 mL/min (by C-G formula based on SCr of 0.84 mg/dL).    Allergies  Allergen Reactions  . Morphine Itching    Antimicrobials this admission: 6/6 Unasyn >>   Thank you for allowing pharmacy to be a part of this patient's care.  Demetrius Charity, PharmD Acute Care Pharmacy Resident  Pager: [phone] 05/12/2017

## 2017-05-12 NOTE — Progress Notes (Signed)
Patient ID: Brandon [name], male   DOB: 05-06-1961, 56 y.o.   MRN: [id number]    PROGRESS NOTE  Brandon [name]  [id number]:[id number] DOB: [birth year] DOA: 05/09/2017  PCP: Brandon Hurl, PA-C   Brief Narrative:  Pt is 56 yo male with known DM type II, CKD stage III, seizures, depression, presented with left upper and lower quadrants pain several days in duration. Pt has visited GI doctor 04/13/2017 and was given Cipro and Flagyl due to LLQ pain, was supposed to have colonoscopy in 6 weeks from that point. In ED, imaging studies notable for acute sigmoid diverticulitis.   Assessment & Plan:    Principal Problem:   Acute diverticulitis of intestine / Abdominal pain - Patient presented with abdominal pain in the left upper and left lower abdominal quadrant - CT abdomen notable for acute diverticulitis in the descending and sigmoid colon - Patient reports persistent pain in the same area, somewhat better compared to admission - Currently on Unasyn, day #3 as was recommended per gastroenterology team - GI team has recommending slowly advancing diet but patient believes he would feel better if diet is soft rather than liquid - I have advised patient we can try advancing to soft diet but if he starts vomiting, we'll have to back down to liquid diet - Once patient able to keep by mouth intake we can change to oral antibiotics - Patient wanted to go home today but I have advised him to remain hospitalized until his symptoms improve - We'll continue IV fluids until oral intake improved  Active Problems:   Acute renal failure superimposed on stage 3 chronic kidney disease (Brandon [name]) - Baseline creatinine is 1.28 on 07/26/2016 - Creatinine on this admission is 1.6, likely combination on dehydration , poor oral intake, Lisinopril  - Cr is now WNL    History of seizure - continue Keppra and topomax    Diabetes mellitus with diabetic nephropathy, with long-term current use of insulin (Brandon [name]) - With  persistent hypoglycemic events due to poor oral intake - Advance his diet to see if that will help provided patient can tolerate - Lantus has been discontinued, continue only sliding scale insulin    Dyslipidemia associated with type 2 diabetes mellitus (Brandon [name]) - Keep on Lipitor    Hypokalemia - Supplement and repeat BMP in the morning    Essential hypertension - Blood pressure stable    History of cerebrovascular accident (CVA) with residual deficit - keep on Lipitor and aspirin    Obesity due to excess calories - Body mass index is 37.11 kg/m.  DVT prophylaxis: SCD's Code Status: Full  Family Communication: Patient updated at bedside Disposition Plan: Will likely go home once medically stable  Consultants:   GI  Procedures:   None  Antimicrobials:   Unasyn 6/6 -->  Subjective: Patient reports recurrent nausea and poor oral intake, persistent pain in left upper and lower abdominal quadrant, 3/10 in severity.  Objective: Vitals:   05/11/17 0703 05/11/17 1326 05/11/17 2239 05/12/17 0604  BP:  114/76 121/76 (!) 137/100  Pulse:  67 61 68  Resp:  16 14 14   Temp:  97.8 F (36.6 C) 98 F (36.7 C) 98 F (36.7 C)  TempSrc:  Oral Oral Oral  SpO2:  100% 100% 100%  Weight: 124.1 kg (273 lb 9.5 oz)     Height:        Intake/Output Summary (Last 24 hours) at 05/12/17 1032 Last data filed at 05/12/17 0604  Gross per  24 hour  Intake             1540 ml  Output              400 ml  Net             1140 ml   Filed Weights   05/10/17 0737 05/11/17 0703  Weight: 119.5 kg (263 lb 7.2 oz) 124.1 kg (273 lb 9.5 oz)    Examination:  Constitutional: Appears to be in mild distress due to pain HENT: Normocephalic. External right and left ear normal. Oropharynx is clear and moist.  Eyes: Conjunctivae and EOM are normal. PERRLA, no scleral icterus.  Neck: Normal ROM. Neck supple. No JVD. No tracheal deviation. No thyromegaly.  CVS: RRR, S1/S2 +, no murmurs, no gallops,  no carotid bruit.  Pulmonary: Effort and breath sounds normal, no stridor, rhonchi, wheezes, rales.  Abdominal: Soft. BS +,  no distension, tender in left upper and left lower abdominal area  Musculoskeletal: Normal range of motion. No edema and no tenderness.   Data Reviewed: I have personally reviewed following labs and imaging studies  CBC:  Recent Labs Lab 05/09/17 2347 05/11/17 0407 05/12/17 0410  WBC 6.0 5.2 4.8  HGB 12.0* 12.6* 11.8*  HCT 33.9* 36.0* 33.8*  MCV 83.7 84.9 85.1  PLT 200 175 170   Basic Metabolic Panel:  Recent Labs Lab 05/09/17 2347 05/11/17 0407 05/12/17 0410  NA 136 141 141  K 3.7 4.0 3.3*  CL 102 106 109  CO2 23 26 26   GLUCOSE 333* 57* 116*  BUN 23* 16 11  CREATININE 1.67* 0.98 0.84  CALCIUM 9.2 9.0 8.9   Liver Function Tests:  Recent Labs Lab 05/09/17 2347  AST 19  ALT 21  ALKPHOS 97  BILITOT 1.6*  PROT 7.3  ALBUMIN 4.2    Recent Labs Lab 05/09/17 2347  LIPASE 25   HbA1C:  Recent Labs  05/10/17 0805  HGBA1C 10.6*   CBG:  Recent Labs Lab 05/11/17 2106 05/11/17 2149 05/11/17 2303 05/12/17 0603 05/12/17 0759  GLUCAP 64* 78 99 102* 107*   Urine analysis:    Component Value Date/Time   COLORURINE YELLOW 05/10/2017 0022   APPEARANCEUR CLEAR 05/10/2017 0022   LABSPEC 1.012 05/10/2017 0022   PHURINE 5.0 05/10/2017 0022   GLUCOSEU 150 (A) 05/10/2017 0022   HGBUR SMALL (A) 05/10/2017 0022   BILIRUBINUR NEGATIVE 05/10/2017 0022   BILIRUBINUR neg 11/04/2016 1619   KETONESUR NEGATIVE 05/10/2017 0022   PROTEINUR NEGATIVE 05/10/2017 0022   UROBILINOGEN negative 11/04/2016 1619   UROBILINOGEN 1.0 12/13/2014 1029   NITRITE NEGATIVE 05/10/2017 0022   LEUKOCYTESUR TRACE (A) 05/10/2017 0022   Radiology Studies: No results found.    Scheduled Meds: . aspirin  325 mg Oral Daily  . atorvastatin  40 mg Oral Daily  . insulin aspart  0-15 Units Subcutaneous TID WC  . insulin aspart  0-5 Units Subcutaneous QHS  .  levETIRAcetam  250 mg Oral Daily  . potassium chloride  40 mEq Oral Once  . topiramate  50 mg Oral BID   Continuous Infusions: . ampicillin-sulbactam (UNASYN) IV Stopped (05/12/17 1007)  . dextrose 5 % and 0.45% NaCl 1,000 mL (05/12/17 0307)     LOS: 2 days   Time spent: 35 minutes   Faye Ramsay, MD [facility] Pager [phone]  If 7PM-7AM, please contact night-coverage [email or web address] Password [facility] 05/12/2017, 10:32 AM

## 2017-05-13 LAB — CBC
HEMATOCRIT: 34.5 % — AB (ref 39.0–52.0)
HEMOGLOBIN: 12.1 g/dL — AB (ref 13.0–17.0)
MCH: 30 pg (ref 26.0–34.0)
MCHC: 35.1 g/dL (ref 30.0–36.0)
MCV: 85.4 fL (ref 78.0–100.0)
Platelets: 166 10*3/uL (ref 150–400)
RBC: 4.04 MIL/uL — ABNORMAL LOW (ref 4.22–5.81)
RDW: 11.9 % (ref 11.5–15.5)
WBC: 5 10*3/uL (ref 4.0–10.5)

## 2017-05-13 LAB — GLUCOSE, CAPILLARY
GLUCOSE-CAPILLARY: 167 mg/dL — AB (ref 65–99)
GLUCOSE-CAPILLARY: 235 mg/dL — AB (ref 65–99)
Glucose-Capillary: 220 mg/dL — ABNORMAL HIGH (ref 65–99)
Glucose-Capillary: 196 mg/dL — ABNORMAL HIGH (ref 65–99)

## 2017-05-13 LAB — COMPREHENSIVE METABOLIC PANEL
ALBUMIN: 3.6 g/dL (ref 3.5–5.0)
ALK PHOS: 95 U/L (ref 38–126)
ALT: 18 U/L (ref 17–63)
AST: 17 U/L (ref 15–41)
Anion gap: 5 (ref 5–15)
BILIRUBIN TOTAL: 1.3 mg/dL — AB (ref 0.3–1.2)
BUN: 13 mg/dL (ref 6–20)
CALCIUM: 9 mg/dL (ref 8.9–10.3)
CO2: 26 mmol/L (ref 22–32)
Chloride: 107 mmol/L (ref 101–111)
Creatinine, Ser: 0.94 mg/dL (ref 0.61–1.24)
GFR calc Af Amer: >60 mL/min (ref >60)
GLUCOSE: 281 mg/dL — AB (ref 65–99)
Potassium: 3.8 mmol/L (ref 3.5–5.1)
Sodium: 138 mmol/L (ref 135–145)
TOTAL PROTEIN: 6.7 g/dL (ref 6.5–8.1)

## 2017-05-13 MED ORDER — SODIUM CHLORIDE 0.9 % IV SOLN
INTRAVENOUS | Status: DC
  Administered 2017-05-13 – 2017-05-14 (×2): via INTRAVENOUS

## 2017-05-13 MED ORDER — INSULIN GLARGINE 100 UNIT/ML ~~LOC~~ SOLN
15.0000 [IU] | Freq: Every day | SUBCUTANEOUS | Status: DC
  Administered 2017-05-13: 15 [IU] via SUBCUTANEOUS
  Filled 2017-05-13 (×2): qty 0.15

## 2017-05-13 NOTE — Progress Notes (Addendum)
Patient ID: Brandon [name], male   DOB: 02-14-1961, 56 y.o.   MRN: [id number]    PROGRESS NOTE  Brandon [name]  JXB:[id number] DOB: 1961-10-11 DOA: 05/09/2017  PCP: Carlena Hurl, PA-C   Brief Narrative:  Pt is 56 yo male with known DM type II, CKD stage III, seizures, depression, presented with left upper and lower quadrants pain several days in duration. Pt has visited GI doctor 04/13/2017 and was given Cipro and Flagyl due to LLQ pain, was supposed to have colonoscopy in 6 weeks from that point. In ED, imaging studies notable for acute sigmoid diverticulitis.   Assessment & Plan:    Principal Problem:   Acute diverticulitis of intestine / Abdominal pain - Patient presented with abdominal pain in the left upper and left lower abdominal quadrant - CT abdomen notable for acute diverticulitis in the descending and sigmoid colon - pt reports feeling better this AM - continue Unasyn day $4 - diet advanced to soft and pt so far tolerating well - will see how pt does today, may be able to stop IVF - no fevers and WBL is now WNL  Active Problems:   Acute renal failure superimposed on stage 3 chronic kidney disease ([facility]) - Baseline creatinine is 1.28 on 07/26/2016 - Creatinine on this admission is 1.6, likely combination on dehydration , poor oral intake, Lisinopril  - Cr is WNL this AM    History of seizure - continue Keppra and topomax    Diabetes mellitus with diabetic nephropathy, with long-term current use of insulin ([facility]) - With persistent hypoglycemic events due to poor oral intake - add home regimen Lantus as pt eating better now, will start with 15 U and slowly adjust as clinically indicated  - he is on 45 U at home     Dyslipidemia associated with type 2 diabetes mellitus ([place]) - Keep on Lipitor    Hypokalemia - supplemented and WNL this AM     Essential hypertension - stable this AM     History of cerebrovascular accident (CVA) with residual deficit -  continue Lipitor     Obesity due to excess calories - Body mass index is 38.87 kg/m.  DVT prophylaxis: SCD's Code Status: Full  Family Communication: Patient updated at bedside Disposition Plan: Will likely go home in AM  Consultants:   GI  Procedures:   None  Antimicrobials:   Unasyn 6/6 -->  Subjective: Pt reports feeling better, less nausea.   Objective: Vitals:   05/12/17 0604 05/12/17 1430 05/12/17 2037 05/13/17 0606  BP: (!) 137/100 (!) 130/93 131/80 134/90  Pulse: 68 78 81 71  Resp: 14 18 14 15   Temp: 98 F (36.7 C) 97.8 F (36.6 C) 97.6 F (36.4 C) 97.1 F (36.2 C)  TempSrc: Oral Oral Oral Oral  SpO2: 100% 100% 100% 100%  Weight:    130 kg (286 lb 9.6 oz)  Height:        Intake/Output Summary (Last 24 hours) at 05/13/17 1251 Last data filed at 05/13/17 0746  Gross per 24 hour  Intake             2280 ml  Output                0 ml  Net             2280 ml   Filed Weights   05/10/17 0737 05/11/17 0703 05/13/17 0606  Weight: 119.5 kg (263 lb 7.2 oz) 124.1 kg (273  lb 9.5 oz) 130 kg (286 lb 9.6 oz)    Physical Exam  Constitutional: Appears well-developed and well-nourished. No distress.  CVS: RRR, S1/S2 +, no murmurs, no gallops, no carotid bruit.  Pulmonary: Effort and breath sounds normal, no stridor, rhonchi, wheezes, rales.  Abdominal: Soft. BS +,  no distension, tenderness, rebound or guarding.  Musculoskeletal: Normal range of motion. No edema and no tenderness.  Lymphadenopathy: No lymphadenopathy noted, cervical, inguinal.  Data Reviewed: I have personally reviewed following labs and imaging studies  CBC:  Recent Labs Lab 05/09/17 2347 05/11/17 0407 05/12/17 0410 05/13/17 0357  WBC 6.0 5.2 4.8 5.0  HGB 12.0* 12.6* 11.8* 12.1*  HCT 33.9* 36.0* 33.8* 34.5*  MCV 83.7 84.9 85.1 85.4  PLT 200 175 166 518   Basic Metabolic Panel:  Recent Labs Lab 05/09/17 2347 05/11/17 0407 05/12/17 0410 05/13/17 0357  NA 136 141 141 138    K 3.7 4.0 3.3* 3.8  CL 102 106 109 107  CO2 23 26 26 26   GLUCOSE 333* 57* 116* 281*  BUN 23* 16 11 13   CREATININE 1.67* 0.98 0.84 0.94  CALCIUM 9.2 9.0 8.9 9.0   Liver Function Tests:  Recent Labs Lab 05/09/17 2347 05/13/17 0357  AST 19 17  ALT 21 18  ALKPHOS 97 95  BILITOT 1.6* 1.3*  PROT 7.3 6.7  ALBUMIN 4.2 3.6    Recent Labs Lab 05/09/17 2347  LIPASE 25   CBG:  Recent Labs Lab 05/12/17 1145 05/12/17 1720 05/12/17 2134 05/13/17 0757 05/13/17 1156  GLUCAP 126* 123* 190* 220* 196*   Urine analysis:    Component Value Date/Time   COLORURINE YELLOW 05/10/2017 0022   APPEARANCEUR CLEAR 05/10/2017 0022   LABSPEC 1.012 05/10/2017 0022   PHURINE 5.0 05/10/2017 0022   GLUCOSEU 150 (A) 05/10/2017 0022   HGBUR SMALL (A) 05/10/2017 0022   BILIRUBINUR NEGATIVE 05/10/2017 0022   BILIRUBINUR neg 11/04/2016 1619   KETONESUR NEGATIVE 05/10/2017 0022   PROTEINUR NEGATIVE 05/10/2017 0022   UROBILINOGEN negative 11/04/2016 1619   UROBILINOGEN 1.0 12/13/2014 1029   NITRITE NEGATIVE 05/10/2017 0022   LEUKOCYTESUR TRACE (A) 05/10/2017 0022   Radiology Studies: No results found.  Scheduled Meds: . aspirin  325 mg Oral Daily  . atorvastatin  40 mg Oral Daily  . insulin aspart  0-15 Units Subcutaneous TID WC  . levETIRAcetam  250 mg Oral Daily  . topiramate  50 mg Oral BID   Continuous Infusions: . ampicillin-sulbactam (UNASYN) IV 3 g (05/13/17 1215)  . dextrose 5 % and 0.45% NaCl 100 mL/hr at 05/12/17 1534     LOS: 3 days   Time spent: 25 minutes   Faye Ramsay, MD [facility] Pager [phone]  If 7PM-7AM, please contact night-coverage [email or web address] Password [email or web address] 05/13/2017, 12:51 PM

## 2017-05-14 LAB — BASIC METABOLIC PANEL
Anion gap: 6 (ref 5–15)
BUN: 13 mg/dL (ref 6–20)
CALCIUM: 9.3 mg/dL (ref 8.9–10.3)
CO2: 26 mmol/L (ref 22–32)
CREATININE: 0.94 mg/dL (ref 0.61–1.24)
Chloride: 109 mmol/L (ref 101–111)
GFR calc Af Amer: >60 mL/min (ref >60)
GFR calc non Af Amer: >60 mL/min (ref >60)
GLUCOSE: 222 mg/dL — AB (ref 65–99)
Potassium: 4.3 mmol/L (ref 3.5–5.1)
Sodium: 141 mmol/L (ref 135–145)

## 2017-05-14 LAB — CBC
HCT: 33.2 % — ABNORMAL LOW (ref 39.0–52.0)
Hemoglobin: 11.4 g/dL — ABNORMAL LOW (ref 13.0–17.0)
MCH: 29.5 pg (ref 26.0–34.0)
MCHC: 34.3 g/dL (ref 30.0–36.0)
MCV: 86 fL (ref 78.0–100.0)
Platelets: 191 10*3/uL (ref 150–400)
RBC: 3.86 MIL/uL — ABNORMAL LOW (ref 4.22–5.81)
RDW: 12 % (ref 11.5–15.5)
WBC: 4.3 10*3/uL (ref 4.0–10.5)

## 2017-05-14 LAB — GLUCOSE, CAPILLARY
GLUCOSE-CAPILLARY: 213 mg/dL — AB (ref 65–99)
GLUCOSE-CAPILLARY: 188 mg/dL — AB (ref 65–99)
Glucose-Capillary: 175 mg/dL — ABNORMAL HIGH (ref 65–99)
Glucose-Capillary: 281 mg/dL — ABNORMAL HIGH (ref 65–99)

## 2017-05-14 MED ORDER — INSULIN GLARGINE 100 UNIT/ML ~~LOC~~ SOLN
25.0000 [IU] | Freq: Every day | SUBCUTANEOUS | Status: DC
  Administered 2017-05-14: 25 [IU] via SUBCUTANEOUS
  Filled 2017-05-14 (×2): qty 0.25

## 2017-05-14 NOTE — Progress Notes (Signed)
Patient ID: Brandon [name], male   DOB: Mar 22, 1961, 56 y.o.   MRN: [id number]    PROGRESS NOTE  Brandon [name]  [id number]:[id number] DOB: Oct 06, 1961 DOA: 05/09/2017  PCP: Carlena Hurl, PA-C   Brief Narrative:  Pt is 56 yo male with known DM type II, CKD stage III, seizures, depression, presented with left upper and lower quadrants pain several days in duration. Pt has visited GI doctor 04/13/2017 and was given Cipro and Flagyl due to LLQ pain, was supposed to have colonoscopy in 6 weeks from that point. In ED, imaging studies notable for acute sigmoid diverticulitis.   Assessment & Plan:    Principal Problem:   Acute diverticulitis of intestine / Abdominal pain - Patient presented with abdominal pain in the left upper and left lower abdominal quadrant - CT abdomen notable for acute diverticulitis in the descending and sigmoid colon - pt reports feeling better but had more pain this AM and nausea but no vomiting  - continue Unasyn day #5 - diet soft  - stop IVF today to see how pt does  - no fevers and WBL is now WNL  Active Problems:   Acute renal failure superimposed on stage 3 chronic kidney disease (HCC) - Baseline creatinine is 1.28 on 07/26/2016 - Creatinine on this admission is 1.6, likely combination on dehydration , poor oral intake, Lisinopril  - Cr remain WNL    History of seizure - continue Keppra and topomax    Diabetes mellitus with diabetic nephropathy, with long-term current use of insulin ([facility]CC) - With persistent hypoglycemic events due to poor oral intake - will increase the dose of Lantus from 15 --> 25 U due to better oral intake and CBG's in 230's    Dyslipidemia associated with type 2 diabetes mellitus ([place]) - Keep on Lipitor    Hypokalemia - WNL this AM    Essential hypertension - reasonable inpatient control     History of cerebrovascular accident (CVA) with residual deficit - continue Lipitor     Obesity due to excess calories - Body mass  index is 38.87 kg/m.  DVT prophylaxis: SCD's Code Status: Full  Family Communication: pt at bedside  Disposition Plan: home in AM  Consultants:   GI  Procedures:   None  Antimicrobials:   Unasyn 6/6 -->  Subjective: Pt reports more nausea but no vomiting.   Objective: Vitals:   05/13/17 0606 05/13/17 1405 05/13/17 2121 05/14/17 0635  BP: 134/90 120/86 (!) 153/90 135/87  Pulse: 71 68 77 73  Resp: 15 16 16 18   Temp: 97.1 F (36.2 C) 97.3 F (36.3 C) 98.1 F (36.7 C) 97.8 F (36.6 C)  TempSrc: Oral Oral Oral Oral  SpO2: 100% 100% 100% 100%  Weight: 130 kg (286 lb 9.6 oz)     Height:        Intake/Output Summary (Last 24 hours) at 05/14/17 1246 Last data filed at 05/14/17 0624  Gross per 24 hour  Intake          1976.25 ml  Output              500 ml  Net          1476.25 ml   Filed Weights   05/10/17 0737 05/11/17 0703 05/13/17 0606  Weight: 119.5 kg (263 lb 7.2 oz) 124.1 kg (273 lb 9.5 oz) 130 kg (286 lb 9.6 oz)    Physical Exam  Constitutional: Appears well-developed and well-nourished. No distress.  CVS: RRR, S1/S2 +,  no murmurs, no gallops, no carotid bruit.  Pulmonary: Effort and breath sounds normal, no stridor, rhonchi, wheezes, rales.  Abdominal: Soft. BS +,  no distension, tenderness is mold and in epigastric area, no rebound or guarding.  Musculoskeletal: Normal range of motion. No edema and no tenderness.   Data Reviewed: I have personally reviewed following labs and imaging studies  CBC:  Recent Labs Lab 05/09/17 2347 05/11/17 0407 05/12/17 0410 05/13/17 0357 05/14/17 0357  WBC 6.0 5.2 4.8 5.0 4.3  HGB 12.0* 12.6* 11.8* 12.1* 11.4*  HCT 33.9* 36.0* 33.8* 34.5* 33.2*  MCV 83.7 84.9 85.1 85.4 86.0  PLT 200 175 166 166 841   Basic Metabolic Panel:  Recent Labs Lab 05/09/17 2347 05/11/17 0407 05/12/17 0410 05/13/17 0357 05/14/17 0357  NA 136 141 141 138 141  K 3.7 4.0 3.3* 3.8 4.3  CL 102 106 109 107 109  CO2 23 26 26 26 26    GLUCOSE 333* 57* 116* 281* 222*  BUN 23* 16 11 13 13   CREATININE 1.67* 0.98 0.84 0.94 0.94  CALCIUM 9.2 9.0 8.9 9.0 9.3   Liver Function Tests:  Recent Labs Lab 05/09/17 2347 05/13/17 0357  AST 19 17  ALT 21 18  ALKPHOS 97 95  BILITOT 1.6* 1.3*  PROT 7.3 6.7  ALBUMIN 4.2 3.6    Recent Labs Lab 05/09/17 2347  LIPASE 25   CBG:  Recent Labs Lab 05/13/17 1156 05/13/17 1719 05/13/17 2204 05/14/17 0748 05/14/17 1206  GLUCAP 196* 167* 235* 175* 281*   Urine analysis:    Component Value Date/Time   COLORURINE YELLOW 05/10/2017 0022   APPEARANCEUR CLEAR 05/10/2017 0022   LABSPEC 1.012 05/10/2017 0022   PHURINE 5.0 05/10/2017 0022   GLUCOSEU 150 (A) 05/10/2017 0022   HGBUR SMALL (A) 05/10/2017 0022   BILIRUBINUR NEGATIVE 05/10/2017 0022   BILIRUBINUR neg 11/04/2016 1619   KETONESUR NEGATIVE 05/10/2017 0022   PROTEINUR NEGATIVE 05/10/2017 0022   UROBILINOGEN negative 11/04/2016 1619   UROBILINOGEN 1.0 12/13/2014 1029   NITRITE NEGATIVE 05/10/2017 0022   LEUKOCYTESUR TRACE (A) 05/10/2017 0022   Radiology Studies: No results found.  Scheduled Meds: . aspirin  325 mg Oral Daily  . atorvastatin  40 mg Oral Daily  . insulin aspart  0-15 Units Subcutaneous TID WC  . insulin glargine  15 Units Subcutaneous Q2200  . levETIRAcetam  250 mg Oral Daily  . topiramate  50 mg Oral BID   Continuous Infusions: . sodium chloride 75 mL/hr at 05/14/17 1119  . ampicillin-sulbactam (UNASYN) IV 3 g (05/14/17 1218)     LOS: 4 days   Time spent: 25 minutes  Faye Ramsay, MD [facility] Pager [phone]  If 7PM-7AM, please contact night-coverage [email or web address] Password [email or web address] 05/14/2017, 12:46 PM

## 2017-05-15 LAB — GLUCOSE, CAPILLARY
GLUCOSE-CAPILLARY: 153 mg/dL — AB (ref 65–99)
Glucose-Capillary: 83 mg/dL (ref 65–99)

## 2017-05-15 MED ORDER — AMOXICILLIN-POT CLAVULANATE 875-125 MG PO TABS: 1.0000 | ORAL_TABLET | Freq: Two times a day (BID) | ORAL | 0 refills | Status: AC

## 2017-05-15 MED ORDER — HYDROMORPHONE HCL 2 MG PO TABS: 2.0000 mg | ORAL_TABLET | Freq: Two times a day (BID) | ORAL | 0 refills | Status: DC | PRN

## 2017-05-15 NOTE — Telephone Encounter (Signed)
I called pt to discuss. No answer, left a message asking him to call me back. 

## 2017-05-15 NOTE — Discharge Summary (Addendum)
 Physician Discharge Summary  Brandon [name] MRN:[id number] DOB: 08/19/1961 DOA: 05/09/2017  PCP: Tysinger, David S, PA-C  Admit date: 05/09/2017 Discharge date: 05/15/2017  Recommendations for Outpatient Follow-up:  1. Pt will need to follow up with PCP in 1-2 weeks post discharge 2. Pt also advised to follow up with GI doctor, please see below  3. Please obtain BMP to evaluate electrolytes and kidney function 4. Please also check CBC to evaluate Hg and Hct levels 5. Please continue Augmentin for 10 more days post discharge   Discharge Diagnoses:  Principal Problem:   Acute diverticulitis of intestine Active Problems:   Abdominal pain   History of seizure   Acute renal failure superimposed on stage 3 chronic kidney disease (HCC)   Diabetes mellitus with diabetic nephropathy, with long-term current use of insulin (HCC)   Dyslipidemia associated with type 2 diabetes mellitus (HCC)   Essential hypertension   History of cerebrovascular accident (CVA) with residual deficit  Discharge Condition: Stable  Diet recommendation: Heart healthy diet discussed in details   Brief Narrative:  Pt is 56 yo male with known DM type II, CKD stage III, seizures, depression, presented with left upper and lower quadrants pain several days in duration. Pt has visited GI doctor 04/13/2017 and was given Cipro and Flagyl due to LLQ pain, was supposed to have colonoscopy in 6 weeks from that point. In ED, imaging studies notable for acute sigmoid diverticulitis.   Assessment & Plan:    Principal Problem: Acute diverticulitis of intestine /Abdominal pain - Patient presented with abdominal pain in the left upper and left lower abdominal quadrant - CT abdomen notable for acute diverticulitis in the descending and sigmoid colon - pt reports feeling better and wants to go home, tolerating diet well - has been on Unasyn inpatient and will change to oral Augmentin as was recommended by GI doctor   Active  Problems: Acute renal failure superimposed on stage 3 chronic kidney disease (HCC) - Baseline creatinine is 1.28 on 07/26/2016 - Creatinine on this admission is 1.6, likely combination on dehydration , poor oral intake, Lisinopril  - resolved with IVF  History of seizure - continue Keppra and topomax  Diabetes mellitus with diabetic nephropathy, with long-term current use of insulin (HCC) - resume home medical regimen   Dyslipidemia associated with type 2 diabetes mellitus (HCC) - continue Lipitor     Hypokalemia - supplemented and WNL  Essential hypertension - reasonably stable - resume home medical regimen   History of cerebrovascular accident (CVA) with residual deficit - continue Lipitor   Obesity due to excess calories - Body mass index is 38.87 kg/m.  DVT prophylaxis: SCD's Code Status: Full  Family Communication: pt at bedside  Disposition Plan: home   Consultants:   GI  Procedures:   None  Antimicrobials:   Unasyn 6/6 --> 6/11  Augmentin 6/11 -->  Procedures/Studies: Ct Abdomen Pelvis W Contrast  Result Date: 05/10/2017 CLINICAL DATA:  Left lower quadrant pain. EXAM: CT ABDOMEN AND PELVIS WITH CONTRAST TECHNIQUE: Multidetector CT imaging of the abdomen and pelvis was performed using the standard protocol following bolus administration of intravenous contrast. CONTRAST:  80 cc Isovue-300 IV COMPARISON:  Renal protocol CT 04/04/2017 FINDINGS: Lower chest: No consolidation or pleural fluid. Hepatobiliary: No focal liver abnormality is seen. No gallstones, gallbladder wall thickening, or biliary dilatation. Pancreas: No ductal dilatation or inflammation. Spleen: Normal in size without focal abnormality. Adrenals/Urinary Tract: No adrenal nodule. Unchanged size and appearance of bilateral cryoablation defects.   Small 9 mm subcapsular hypodense lesion in the lower right kidney is unchanged. No hydronephrosis or perinephric edema. There is  symmetric renal excretion on delayed phase imaging. Urinary bladder is minimally distended, question bladder wall thickening. Stomach/Bowel: Mild pericolonic soft tissue edema at the junction of the descending and proximal sigmoid colon in the region of multiple colonic diverticula, suspect mild diverticulitis. This is more distal than the region of previous inflammation in the mid descending colon, that prior inflammation has resolved. Multiple additional noninflamed diverticula in the distal colon. Normal appendix. Small hiatal hernia. No small bowel dilatation. Vascular/Lymphatic: Aortic atherosclerosis without aneurysm. No retroperitoneal adenopathy. Reproductive: Prostate is unremarkable. Other: Minimal fat within both inguinal canals. No free air, free fluid, or intra-abdominal fluid collection. Musculoskeletal: Stable degenerative change throughout the spine and both hips. Partial ankylosis of both sacroiliac joints. IMPRESSION: 1. Mild acute diverticulitis at the junction of the descending and sigmoid colon. No perforation or abscess. 2. Bladder wall thickening may be secondary to nondistention or cystitis. 3. Unchanged appearance of bilateral renal cryoablation defects. 4. Aortic atherosclerosis. Electronically Signed   By: Melanie  Ehinger M.D.   On: 05/10/2017 04:05   Discharge Exam: Vitals:   05/14/17 1941 05/15/17 0503  BP: (!) 147/83 (!) 158/90  Pulse: 76 68  Resp: 18 16  Temp: 98.1 F (36.7 C) 98.3 F (36.8 C)   Vitals:   05/14/17 0635 05/14/17 1515 05/14/17 1941 05/15/17 0503  BP: 135/87 (!) 126/94 (!) 147/83 (!) 158/90  Pulse: 73 73 76 68  Resp: 18 16 18 16  Temp: 97.8 F (36.6 C) 97.9 F (36.6 C) 98.1 F (36.7 C) 98.3 F (36.8 C)  TempSrc: Oral Oral Oral Oral  SpO2: 100% 100% 98% 100%  Weight:      Height:        General: Pt is alert, follows commands appropriately, not in acute distress Cardiovascular: Regular rate and rhythm, S1/S2 +, no murmurs, no rubs, no  gallops Respiratory: Clear to auscultation bilaterally, no wheezing, no crackles, no rhonchi Abdominal: Soft, non tender, non distended, bowel sounds +, no guarding  Discharge Instructions  Discharge Instructions    Diet - low sodium heart healthy    Complete by:  As directed    Increase activity slowly    Complete by:  As directed      Allergies as of 05/15/2017      Reactions   Morphine Itching      Medication List    TAKE these medications   amoxicillin-clavulanate 875-125 MG tablet Commonly known as:  AUGMENTIN Take 1 tablet by mouth 2 (two) times daily.   aspirin 325 MG tablet Take 325 mg by mouth daily.   atorvastatin 40 MG tablet Commonly known as:  LIPITOR Take 1 tablet (40 mg total) by mouth daily.   blood glucose meter kit and supplies Dispense based on patient and insurance preference. Use up to four times daily as directed. (FOR ICD-9 250.00, 250.01).   Exenatide ER 2 MG Pen Inject 2 mg into the skin.   HYDROmorphone 2 MG tablet Commonly known as:  DILAUDID Take 1 tablet (2 mg total) by mouth every 12 (twelve) hours as needed for severe pain.   Insulin Glargine 100 UNIT/ML Solostar Pen Commonly known as:  LANTUS Inject 45 Units into the skin daily at 10 pm.   levETIRAcetam 500 MG tablet Commonly known as:  KEPPRA 1/2 tablet po BID What changed:  how much to take  how to take this  when   to take this  additional instructions   lisinopril 20 MG tablet Commonly known as:  PRINIVIL,ZESTRIL Take 1 tablet (20 mg total) by mouth daily.   Na Sulfate-K Sulfate-Mg Sulf 17.5-3.13-1.6 GM/180ML Soln Take 1 kit by mouth as directed.   ondansetron 4 MG tablet Commonly known as:  ZOFRAN Take 1 tablet (4 mg total) by mouth every 8 (eight) hours as needed for nausea or vomiting.   Pen Needles 32G X 4 MM Misc 1 each by Does not apply route daily. Use for insulin pens   polyethylene glycol powder powder Commonly known as:  GLYCOLAX/MIRALAX 1 cap full  daily   topiramate 50 MG tablet Commonly known as:  TOPAMAX Take 1 tablet (50 mg total) by mouth 2 (two) times daily.      Follow-up Information    Tysinger, Camelia Eng, PA-C Follow up.   Specialty:  Family Medicine Contact information: [place] [address] [phone]        Theodis Blaze, MD. Call.   Specialty:  Internal Medicine Contact information: [address] [place] [address] [phone]        Manus Gunning, MD. Schedule an appointment as soon as possible for a visit.   Specialty:  Gastroenterology Contact information: [address] [place] [address] [phone]          The results of significant diagnostics from this hospitalization (including imaging, microbiology, ancillary and laboratory) are listed below for reference.    Microbiology: No results found for this or any previous visit (from the past 240 hour(s)).   Labs: Basic Metabolic Panel:  Recent Labs Lab 05/09/17 2347 05/11/17 0407 05/12/17 0410 05/13/17 0357 05/14/17 0357  NA 136 141 141 138 141  K 3.7 4.0 3.3* 3.8 4.3  CL 102 106 109 107 109  CO2 _0 GLUCOSE 333* 57* 116* 281* 222*  BUN 23* _1 CREATININE 1.67* 0.98 0.84 0.94 0.94  CALCIUM 9.2 9.0 8.9 9.0 9.3   Liver Function Tests:  Recent Labs Lab 05/09/17 2347 05/13/17 0357  AST 19 17  ALT 21 18  ALKPHOS 97 95  BILITOT 1.6* 1.3*  PROT 7.3 6.7  ALBUMIN 4.2 3.6    Recent Labs Lab 05/09/17 2347  LIPASE 25   CBC:  Recent Labs Lab 05/09/17 2347 05/11/17 0407 05/12/17 0410 05/13/17 0357 05/14/17 0357  WBC 6.0 5.2 4.8 5.0 4.3  HGB 12.0* 12.6* 11.8* 12.1* 11.4*  HCT 33.9* 36.0* 33.8* 34.5* 33.2*  MCV 83.7 84.9 85.1 85.4 86.0  PLT 200 175 166 166 191   CBG:  Recent Labs Lab 05/14/17 0748 05/14/17 1206 05/14/17 1732 05/14/17 2023 05/15/17 0725  GLUCAP 175* 281* 213* 188* 153*    SIGNED: Time coordinating  discharge: 30 minutes  Faye Ramsay, MD  [facility] 05/15/2017, 11:45 AM Pager [phone]  If 7PM-7AM, please contact night-coverage [email or web address] Password [email or web address]

## 2017-05-15 NOTE — Discharge Instructions (Signed)

## 2017-05-16 ENCOUNTER — Telehealth: Payer: Self-pay

## 2017-05-16 ENCOUNTER — Telehealth: Payer: Self-pay | Admitting: Medical

## 2017-05-16 NOTE — Telephone Encounter (Signed)
Left message for pt to call. Pt needed a hospital follow up already had a visit scheduled for 06/14. That appt was adjusted to be TOC/hospital follow up.

## 2017-05-16 NOTE — Telephone Encounter (Signed)
Spoke with pt- he reports that he is doing ok. Pt is not able to come to appt d/t transportation issues.

## 2017-05-18 ENCOUNTER — Inpatient Hospital Stay: Payer: Medicaid Other | Admitting: Medical

## 2017-05-19 ENCOUNTER — Encounter: Payer: Self-pay | Admitting: Interventional Radiology

## 2017-05-22 NOTE — Telephone Encounter (Signed)
I called pt to discuss cpap compliance. Pt didn't answer. LVM for pt to call me back.

## 2017-05-23 ENCOUNTER — Ambulatory Visit (INDEPENDENT_AMBULATORY_CARE_PROVIDER_SITE_OTHER): Payer: Medicaid Other | Admitting: Medical

## 2017-05-23 ENCOUNTER — Encounter: Payer: Self-pay | Admitting: Medical

## 2017-05-23 VITALS — BP 114/74 | HR 80 | Temp 98.2°F | Wt 257.8 lb

## 2017-05-23 DIAGNOSIS — R110 Nausea: Secondary | ICD-10-CM | POA: Diagnosis not present

## 2017-05-23 DIAGNOSIS — R112 Nausea with vomiting, unspecified: Secondary | ICD-10-CM | POA: Insufficient documentation

## 2017-05-23 DIAGNOSIS — K5792 Diverticulitis of intestine, part unspecified, without perforation or abscess without bleeding: Secondary | ICD-10-CM

## 2017-05-23 DIAGNOSIS — N179 Acute kidney failure, unspecified: Secondary | ICD-10-CM | POA: Insufficient documentation

## 2017-05-23 DIAGNOSIS — R109 Unspecified abdominal pain: Secondary | ICD-10-CM

## 2017-05-23 LAB — POCT URINALYSIS DIP (PROADVANTAGE DEVICE)
BILIRUBIN UA: NEGATIVE
BILIRUBIN UA: NEGATIVE mg/dL
Blood, UA: NEGATIVE
GLUCOSE UA: NEGATIVE mg/dL
Nitrite, UA: NEGATIVE
SPECIFIC GRAVITY, URINE: 1.03
Urobilinogen, Ur: NEGATIVE
pH, UA: 6 (ref 5.0–8.0)

## 2017-05-23 MED ORDER — ONDANSETRON HCL 4 MG PO TABS: 4.0000 mg | ORAL_TABLET | Freq: Three times a day (TID) | ORAL | 0 refills | Status: DC | PRN

## 2017-05-23 NOTE — Telephone Encounter (Signed)
I called pt again to discuss his cpap compliance. This is our third attempt at contacting the pt to discuss this. I will mail the pt a letter asking him to call me back.

## 2017-05-23 NOTE — Telephone Encounter (Signed)
Pt returned my call. I asked him to please use his cpap at least every night for four hours. I asked him to get in touch with [facility] for a high mask leak. Pt says he has been recently hospitalized but promises to do better with his cpap. I reminded pt of his appt with Dr. Rexene Alberts on 07/03/17 at 8:30am

## 2017-05-23 NOTE — Progress Notes (Signed)
Subjective: Chief Complaint  Patient presents with  . Hospitalization Follow-up    hospital follow up, diverticlitis, and abcess infection  x 5 days    Here for hospitalization f/u.  Was admitted : 05/09/17 to 05/15/17  Diagnosed with diverticulitis in several areas of colon per his report.   Was given IV fluids and IV antibiotics, pain control with Dilaudid which was helping.  Since last visit here has been seeing pain clinic, but ended up losing some of his pain medication.  He did not call pain clinic back for more.  He does have some pain medication left from hospitalization.      Since discharge, his nausea has improved some, but not eating much at all, and not much liquids either.  Scared to eat.  No diarrhea, no blood in stool.   No other aggravating or relieving factors. No other complaint.    Past Medical History:  Diagnosis Date  . Acute ischemic stroke ([place]) 11/2013  . Anxiety   . Arthritis   . 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 Depression    BH Adm in [place] Depression  . Diabetes mellitus    DKA prior hospitalization  . Diverticulitis    s/p micorperforation Sept 2012-managed conservatively by Gen surgery  . ED (erectile dysfunction)   . Focal seizure ([place]) 11/2013   due to ischemic stroke  . Hiatal hernia   . Hyperlipidemia   . Hypertension   . Kidney tumor 09/2011   Renal cell CA  . Seizures ([place])    none since 2016, taking Keppra - maw  . Thyroid disease    "weak thyroid" per MD  . Wears glasses    Current Outpatient Prescriptions on File Prior to Visit  Medication Sig Dispense Refill  . amoxicillin-clavulanate (AUGMENTIN) 875-125 MG tablet Take 1 tablet by mouth 2 (two) times daily. 20 tablet 0  . aspirin 325 MG tablet Take 325 mg by mouth daily.     [name] atorvastatin (LIPITOR) 40 MG tablet Take 1 tablet (40 mg total) by mouth daily. 90 tablet 3  . blood glucose meter kit and supplies Dispense based on patient and  insurance preference. Use up to four times daily as directed. (FOR ICD-9 250.00, 250.01). 1 each 0  . Insulin Glargine (LANTUS) 100 UNIT/ML Solostar Pen Inject 45 Units into the skin daily at 10 pm. 15 mL 1  . Insulin Pen Needle (PEN NEEDLES) 32G X 4 MM MISC 1 each by Does not apply route daily. Use for insulin pens 200 each 2  . levETIRAcetam (KEPPRA) 500 MG tablet 1/2 tablet po BID (Patient taking differently: Take 250 mg by mouth daily. ) 60 tablet 3  . lisinopril (PRINIVIL,ZESTRIL) 20 MG tablet Take 1 tablet (20 mg total) by mouth daily. 90 tablet 3  . Na Sulfate-K Sulfate-Mg Sulf 17.5-3.13-1.6 GM/180ML SOLN Take 1 kit by mouth as directed. 354 mL 0  . topiramate (TOPAMAX) 50 MG tablet Take 1 tablet (50 mg total) by mouth 2 (two) times daily. 60 tablet 2  . Exenatide ER 2 MG PEN Inject 2 mg into the skin.    . polyethylene glycol powder (GLYCOLAX/MIRALAX) powder 1 cap full daily (Patient not taking: Reported on 05/23/2017) 3350 g 2   No current facility-administered medications on file prior to visit.     ROS as in subjective   Objective: BP 114/74   Pulse 80   Temp 98.2 F (36.8 C)  Wt 257 lb 12.8 oz (116.9 kg)   SpO2 99%   BMI 34.96 kg/m   Wt Readings from Last 3 Encounters:  05/23/17 257 lb 12.8 oz (116.9 kg)  05/13/17 286 lb 9.6 oz (130 kg)  04/20/17 258 lb (117 kg)   General appearance: alert, no distress, WD/WN,  Oral cavity: MMM, no lesions Neck: supple, no lymphadenopathy, no thyromegaly, no masses Heart: RRR, normal S1, S2, no murmurs Lungs: CTA bilaterally, no wheezes, rhonchi, or rales Abdomen: +bs, soft, mild left upper quadrant tenderness and mild left lower quadrant tenderness, otherwise non tender, non distended, no masses, no hepatomegaly, no splenomegaly Pulses: 2+ symmetric, upper and lower extremities, normal cap refill Ext: no edema    Assessment: Encounter Diagnoses  Name Primary?  . Diverticulitis Yes  . Nausea   . Abdominal pain, unspecified  abdominal location   . Acute renal failure, unspecified acute renal failure type [facility])       Plan: Reviewed hospitalization records, imaging, labs, medications reviewed.   Advised he try and advanced clear liquids now, try and advance small bites of solid BRAT foods such as rice, applesauce, soup, toast etc, but just small few bites every now and then.  If pain worsens or if unable to keep anything down, call back.  Low threshold for recheck.   Labs today.   C/t same medications.  Finish Augmentin antibiotic.   F/u pending labs.    [place] was seen today for hospitalization follow-up.  Diagnoses and all orders for this visit:  Diverticulitis -     Basic metabolic panel -     CBC  Nausea -     Basic metabolic panel -     CBC  Abdominal pain, unspecified abdominal location -     Basic metabolic panel -     CBC  Acute renal failure, unspecified acute renal failure type (HCC) -     Basic metabolic panel -     CBC  Other orders -     ondansetron (ZOFRAN) 4 MG tablet; Take 1 tablet (4 mg total) by mouth every 8 (eight) hours as needed for nausea or vomiting.

## 2017-05-24 ENCOUNTER — Other Ambulatory Visit: Payer: Medicaid Other

## 2017-05-25 ENCOUNTER — Encounter: Payer: Self-pay | Admitting: Gastroenterology

## 2017-05-25 ENCOUNTER — Other Ambulatory Visit: Payer: Medicaid Other

## 2017-05-25 LAB — CBC
HEMATOCRIT: 38.6 % (ref 38.5–50.0)
HEMOGLOBIN: 13 g/dL — AB (ref 13.2–17.1)
MCH: 29.4 pg (ref 27.0–33.0)
MCHC: 33.7 g/dL (ref 32.0–36.0)
MCV: 87.3 fL (ref 80.0–100.0)
MPV: 11.6 fL (ref 7.5–12.5)
Platelets: 244 10*3/uL (ref 140–400)
RBC: 4.42 MIL/uL (ref 4.20–5.80)
RDW: 13.2 % (ref 11.0–15.0)
WBC: 4.5 10*3/uL (ref 4.0–10.5)

## 2017-05-26 LAB — BASIC METABOLIC PANEL
BUN: 17 mg/dL (ref 7–25)
CHLORIDE: 106 mmol/L (ref 98–110)
CO2: 20 mmol/L (ref 20–31)
Calcium: 9.5 mg/dL (ref 8.6–10.3)
Creat: 1.69 mg/dL — ABNORMAL HIGH (ref 0.70–1.33)
GLUCOSE: 118 mg/dL — AB (ref 65–99)
POTASSIUM: 4 mmol/L (ref 3.5–5.3)
SODIUM: 141 mmol/L (ref 135–146)

## 2017-05-30 ENCOUNTER — Other Ambulatory Visit: Payer: Self-pay | Admitting: Medical

## 2017-05-30 DIAGNOSIS — R7989 Other specified abnormal findings of blood chemistry: Secondary | ICD-10-CM

## 2017-06-01 ENCOUNTER — Telehealth: Payer: Self-pay

## 2017-06-01 NOTE — Telephone Encounter (Signed)
Pt called back about his lab results. Gave him the results, pt is coming in on Friday  To have the bmet rechecked. Told him to hold off on lisinopril until  After we check the bmet . He also said that he is about 65% better the he was,he is able to keep some food down.

## 2017-06-02 ENCOUNTER — Other Ambulatory Visit: Payer: Medicaid Other

## 2017-06-02 DIAGNOSIS — R7989 Other specified abnormal findings of blood chemistry: Secondary | ICD-10-CM

## 2017-06-02 LAB — BASIC METABOLIC PANEL
BUN: 21 mg/dL (ref 7–25)
CALCIUM: 9.4 mg/dL (ref 8.6–10.3)
CHLORIDE: 109 mmol/L (ref 98–110)
CO2: 20 mmol/L (ref 20–31)
CREATININE: 1.23 mg/dL (ref 0.70–1.33)
Glucose, Bld: 111 mg/dL — ABNORMAL HIGH (ref 65–99)
Potassium: 3.9 mmol/L (ref 3.5–5.3)
SODIUM: 142 mmol/L (ref 135–146)

## 2017-06-06 ENCOUNTER — Telehealth: Payer: Self-pay | Admitting: Neurology

## 2017-06-06 ENCOUNTER — Telehealth: Payer: Self-pay | Admitting: Medical

## 2017-06-06 NOTE — Telephone Encounter (Signed)
Pt called and stated that he would like his lisinopril changed back to micardis. Pt states he would like to fluid pill that is in that. Pt uses [facility] [address] and can be reached at [phone].

## 2017-06-08 ENCOUNTER — Telehealth: Payer: Self-pay | Admitting: Medical

## 2017-06-08 NOTE — Telephone Encounter (Signed)
Hope with [facility] called to report that pt declined their assessement and related services. Hope did want t report the medication descrepancies that she notes: pt never filled Exenatide script, Atorvastatin not filled since May, Levetiracetam not filled since March, Topiramate not filled since April, new Lancets not filled since March so concern about reusing lancets. Hope can be reached at [phone]

## 2017-06-09 NOTE — Telephone Encounter (Signed)
Get him in for f/u and discussion

## 2017-06-11 ENCOUNTER — Other Ambulatory Visit: Payer: Self-pay | Admitting: Medical

## 2017-06-15 ENCOUNTER — Ambulatory Visit (AMBULATORY_SURGERY_CENTER): Payer: Medicaid Other | Admitting: Gastroenterology

## 2017-06-15 ENCOUNTER — Encounter: Payer: Self-pay | Admitting: Gastroenterology

## 2017-06-15 ENCOUNTER — Telehealth: Payer: Self-pay

## 2017-06-15 VITALS — BP 145/97 | HR 67 | Temp 98.0°F | Resp 16 | Ht 72.0 in | Wt 257.0 lb

## 2017-06-15 DIAGNOSIS — R1032 Left lower quadrant pain: Secondary | ICD-10-CM | POA: Diagnosis not present

## 2017-06-15 DIAGNOSIS — R933 Abnormal findings on diagnostic imaging of other parts of digestive tract: Secondary | ICD-10-CM

## 2017-06-15 DIAGNOSIS — K5732 Diverticulitis of large intestine without perforation or abscess without bleeding: Secondary | ICD-10-CM

## 2017-06-15 MED ORDER — SODIUM CHLORIDE 0.9 % IV SOLN: 500.0000 mL | INTRAVENOUS | Status: DC

## 2017-06-15 MED ORDER — DICYCLOMINE HCL 10 MG PO CAPS: 10.0000 mg | ORAL_CAPSULE | Freq: Three times a day (TID) | ORAL | 3 refills | Status: DC

## 2017-06-15 NOTE — Op Note (Signed)
[facility] Patient Name: Brandon [name] Procedure Date: 06/15/2017 10:21 AM MRN: [id number] Endoscopist: Remo Lipps P. Nayra Coury MD, MD Age: 56 Referring MD:  Date of Birth: 1961/06/27 Gender: Male Account #: [id number] Procedure:                Colonoscopy Indications:              Abnormal CT of the GI tract - recent episodes of                            diverticulitis of the sigmoid colon, prior                            colonoscopy limited by poor preps Medicines:                Monitored Anesthesia Care Procedure:                Pre-Anesthesia Assessment:                           - Prior to the procedure, a History and Physical                            was performed, and patient medications and                            allergies were reviewed. The patient's tolerance of                            previous anesthesia was also reviewed. The risks                            and benefits of the procedure and the sedation                            options and risks were discussed with the patient.                            All questions were answered, and informed consent                            was obtained. Prior Anticoagulants: The patient has                            taken aspirin, last dose was 1 day prior to                            procedure. ASA Grade Assessment: III - A patient                            with severe systemic disease. After reviewing the                            risks and benefits, the patient was deemed in  satisfactory condition to undergo the procedure.                           After obtaining informed consent, the colonoscope                            was passed under direct vision. Throughout the                            procedure, the patient's blood pressure, pulse, and                            oxygen saturations were monitored continuously. The                            Model CF-HQ190L  [id number]) scope was introduced                            through the anus and advanced to the the cecum,                            identified by appendiceal orifice and ileocecal                            valve. The colonoscopy was performed without                            difficulty. The patient tolerated the procedure                            well. The quality of the bowel preparation was                            good. The ileocecal valve, appendiceal orifice, and                            rectum were photographed. Scope In: 10:23:26 AM Scope Out: 10:38:25 AM Scope Withdrawal Time: 0 hours 12 minutes 0 seconds  Total Procedure Duration: 0 hours 14 minutes 59 seconds  Findings:                 The perianal and digital rectal examinations were                            normal.                           Many medium-mouthed diverticula were found in the                            left colon. No polyps noted in this area.                           Internal hemorrhoids were found during retroflexion.  The exam was otherwise without abnormality. No                            polyps Complications:            No immediate complications. Estimated blood loss:                            None. Estimated Blood Loss:     Estimated blood loss: none. Impression:               - Diverticulosis in the left colon.                           - Internal hemorrhoids.                           - The examination was otherwise normal. No polyps                           - No specimens collected. Recommendation:           - Patient has a contact number available for                            emergencies. The signs and symptoms of potential                            delayed complications were discussed with the                            patient. Return to normal activities tomorrow.                            Written discharge instructions were provided to the                             patient.                           - Resume previous diet.                           - Continue present medications.                           - Daily fiber supplement for diverticulosis                           - Repeat colonoscopy in 10 years for screening                            purposes.                           - Call if any recurrence of symptoms for  diverticulitis. If you have another recurrence we                            will consider a surgical evaluation Remo Lipps P. Johonna Binette MD, MD 06/15/2017 10:45:15 AM This report has been signed electronically.

## 2017-06-15 NOTE — Patient Instructions (Addendum)
YOU HAD AN ENDOSCOPIC PROCEDURE TODAY AT [facility]:   Refer to the procedure report that was given to you for any specific questions about what was found during the examination.  If the procedure report does not answer your questions, please call your gastroenterologist to clarify.  If you requested that your care partner not be given the details of your procedure findings, then the procedure report has been included in a sealed envelope for you to review at your convenience later.  YOU SHOULD EXPECT: Some feelings of bloating in the abdomen. Passage of more gas than usual.  Walking can help get rid of the air that was put into your GI tract during the procedure and reduce the bloating. If you had a lower endoscopy (such as a colonoscopy or flexible sigmoidoscopy) you may notice spotting of blood in your stool or on the toilet paper. If you underwent a bowel prep for your procedure, you may not have a normal bowel movement for a few days.  Please Note:  You might notice some irritation and congestion in your nose or some drainage.  This is from the oxygen used during your procedure.  There is no need for concern and it should clear up in a day or so.  SYMPTOMS TO REPORT IMMEDIATELY:   Following lower endoscopy (colonoscopy or flexible sigmoidoscopy):  Excessive amounts of blood in the stool  Significant tenderness or worsening of abdominal pains  Swelling of the abdomen that is new, acute  Fever of 100F or higher  For urgent or emergent issues, a gastroenterologist can be reached at any hour by calling [phone].  Please read all handouts given to you by your recovery nurse. Daily fiber supplement and High Fiber diet handout please see.   DIET:  We do recommend a small meal at first, but then you may proceed to your regular diet.  Drink plenty of fluids but you should avoid alcoholic beverages for 24 hours.  ACTIVITY:  You should plan to take it easy for the rest of  today and you should NOT DRIVE or use heavy machinery until tomorrow (because of the sedation medicines used during the test).    FOLLOW UP: Our staff will call the number listed on your records the next business day following your procedure to check on you and address any questions or concerns that you may have regarding the information given to you following your procedure. If we do not reach you, we will leave a message.  However, if you are feeling well and you are not experiencing any problems, there is no need to return our call.  We will assume that you have returned to your regular daily activities without incident.  If any biopsies were taken you will be contacted by phone or by letter within the next 1-3 weeks.  Please call us at [phone] if you have not heard about the biopsies in 3 weeks.    SIGNATURES/CONFIDENTIALITY: You and/or your care partner have signed paperwork which will be entered into your electronic medical record.  These signatures attest to the fact that that the information above on your After Visit Summary has been reviewed and is understood.  Full responsibility of the confidentiality of this discharge information lies with you and/or your care-partner.  Thank you for letting us take care of your healthcare needs today.

## 2017-06-15 NOTE — Progress Notes (Signed)
Report to PACU, RN, vss, BBS= Clear.  

## 2017-06-15 NOTE — Telephone Encounter (Signed)
Pt dropped off list of medications. Said that you had asked for them. Brandon [name]

## 2017-06-16 ENCOUNTER — Institutional Professional Consult (permissible substitution): Payer: Medicaid Other | Admitting: Medical

## 2017-06-16 ENCOUNTER — Telehealth: Payer: Self-pay | Admitting: *Deleted

## 2017-06-16 NOTE — Telephone Encounter (Signed)
Thanks Tracy!

## 2017-06-16 NOTE — Telephone Encounter (Signed)
Medication listed updated with list that pt dropped off in the office. This list is placed in scan pile to be placed in EPIC. Brandon [name]

## 2017-06-16 NOTE — Telephone Encounter (Signed)
  Follow up Call-  Call back number 06/15/2017 01/23/2017 09/12/2016  Post procedure Call Back phone  # [phone] cell [phone]  Permission to leave phone message Yes Yes Yes  Some recent data might be hidden     Patient questions:  Do you have a fever, pain , or abdominal swelling? No. Pain Score  0 *  Have you tolerated food without any problems? No.  Have you been able to return to your normal activities? Yes.    Do you have any questions about your discharge instructions: Diet   No. Medications  No. Follow up visit  No.  Do you have questions or concerns about your Care? No.  Actions: * If pain score is 4 or above: No action needed, pain <4.  Pt states he tried to eat some watermelon yesterday but did not keep it down. Advised him to try some toast or scrambled eggs today and if he had any difficulty then to give [place] a call back.

## 2017-06-26 ENCOUNTER — Encounter: Payer: Self-pay | Admitting: Neurology

## 2017-07-03 ENCOUNTER — Ambulatory Visit (INDEPENDENT_AMBULATORY_CARE_PROVIDER_SITE_OTHER): Payer: Medicaid Other | Admitting: Neurology

## 2017-07-03 ENCOUNTER — Telehealth: Payer: Self-pay | Admitting: Medical

## 2017-07-03 ENCOUNTER — Encounter: Payer: Self-pay | Admitting: Gastroenterology

## 2017-07-03 ENCOUNTER — Encounter: Payer: Self-pay | Admitting: Neurology

## 2017-07-03 VITALS — BP 170/98 | HR 71 | Ht 72.25 in | Wt 267.0 lb

## 2017-07-03 DIAGNOSIS — G4733 Obstructive sleep apnea (adult) (pediatric): Secondary | ICD-10-CM | POA: Diagnosis not present

## 2017-07-03 DIAGNOSIS — Z8673 Personal history of transient ischemic attack (TIA), and cerebral infarction without residual deficits: Secondary | ICD-10-CM

## 2017-07-03 DIAGNOSIS — J3489 Other specified disorders of nose and nasal sinuses: Secondary | ICD-10-CM

## 2017-07-03 DIAGNOSIS — I6930 Unspecified sequelae of cerebral infarction: Secondary | ICD-10-CM

## 2017-07-03 DIAGNOSIS — Z789 Other specified health status: Secondary | ICD-10-CM | POA: Diagnosis not present

## 2017-07-03 NOTE — Telephone Encounter (Signed)
Please look into this

## 2017-07-03 NOTE — Progress Notes (Signed)
Subjective:    Patient ID: Brandon [name] is a 56 y.o. male.  HPI     Interim history:   Brandon [name] is a 56 year old right-handed gentleman with an underlying medical history of ischemic stroke in December 2014 of the right MCA territory with mild residual left-sided weakness, anxiety, depression, diabetes, diverticulitis, ED, history of focal seizure, hyperlipidemia, hypertension, renal cell cancer, thyroid disease, and obesity, who presents for follow-up consultation of his obstructive sleep apnea, after recent sleep study testing. The patient is unaccompanied today. I first met him on 02/08/2017 at the request of Dr. Leonie Man, at which time the patient reported snoring, excessive daytime somnolence as well as witnessed apneas. Brandon [name] was advised to come back for sleep study testing. Brandon [name] had a baseline sleep study, followed by a CPAP titration study. His baseline sleep study from 02/23/2017 showed a sleep efficiency of 93.6 minutes, sleep latency 17 minutes, REM sleep was absent. Brandon [name] had near absence of slow-wave sleep, total AHI was 29.3 per hour, average oxygen saturation 95%, nadir in non-REM sleep was 79%. Brandon [name] had very mild PLMS with no significant arousals. Based on his medical history and sleep-related complaints as well as test results Brandon [name] was invited for a full night CPAP titration study, which Brandon [name] had on 03/22/2017, sleep efficiency was 87.4%, sleep latency 1 minute, REM latency reduced at 46.5 minutes, REM percentage was 23%, slow-wave sleep was 12.1%. Brandon [name] was titrated from 7 cm to 9 cm via full facemask. AHI on the final pressure was 0 per hour with nonsupine REM sleep achieved an O2 nadir of 94%. Brandon [name] had no significant PLMS during the study and indicated that Brandon [name] felt better after using CPAP. We called him in the interim on 05/10/2017 due to noncompliance with his CPAP. Brandon [name] reported that Brandon [name] was recently in the hospital. Brandon [name] was also advised to get in touch with his DME company to get a better mask or a  effect.  Today, 07/03/2017 (all dictated new, as well as above notes, some dictation done in note pad or Word, outside of chart, may appear as copied):  I reviewed his CPAP compliance data from 05/28/2017 through 06/26/2017 which is a total of 30 days, during which time Brandon [name] had a usage of one day with an average of 1 hour and 15 minutes only, essentially noncompliant, in the past 90 days Brandon [name] used his CPAP 22 days with 2% days greater than 4 hours was essentially noncompliant, AHI at 18.8, leak high, pressure at 9 cm. Brandon [name] reports that Brandon [name] has a lot of nasal discharge, clear, but Brandon [name] feels unable to use his CPAP. Of note, Brandon [name] was hospitalized in June secondary to sigmoid diverticulitis.  The patient's allergies, current medications, family history, past medical history, past social history, past surgical history and problem list were reviewed and updated as appropriate.   Previously (copied from previous notes for reference):   02/08/2017: (Brandon [name]) reports snoring and excessive daytime somnolence as well as witnessed apneic pauses while asleep per family Brandon [name] lives with. I reviewed your office note from 02/02/2017. His Epworth sleepiness score is 6 out of 24, fatigue score is 45 out of 63. Brandon [name] reports difficulty sleeping, sometimes Brandon [name] goes days without adequate sleep, Brandon [name] does not always keep a schedule though. Brandon [name] tries to be in bed between 10 and 11 and is often awake by 4:05 AM. Brandon [name] has nocturia several times per average night, averaging about 5 times per night, has occasional morning headaches, reports drinking 2 cups of  coffee per week on average but 5 sodas per day on average. Brandon [name] has 2 grown children. Brandon [name] drinks alcohol occasionally, about twice per month. Used to smoke marijuana when in high school. Brandon [name] has woken up with a sense of gasping for air. Brandon [name] has lost weight.    Her Past Medical History Is Significant For: Past Medical History:  Diagnosis Date  . Acute ischemic stroke ([place]) 11/2013  . Allergy   . Anxiety    . Arthritis   . 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 Depression    BH Adm in [place] Depression  . Diabetes mellitus    DKA prior hospitalization  . Diverticulitis    s/p micorperforation Sept 2012-managed conservatively by Gen surgery  . ED (erectile dysfunction)   . Focal seizure ([place]) 11/2013   due to ischemic stroke  . GERD (gastroesophageal reflux disease)   . Hiatal hernia   . Hyperlipidemia   . Hypertension   . Kidney tumor 09/2011   Renal cell CA  . Seizures ([place])    none since 2016, taking Keppra - maw  . Sleep apnea   . Thyroid disease    "weak thyroid" per MD  . Wears glasses     His Past Surgical History Is Significant For: Past Surgical History:  Procedure Laterality Date  . COLONOSCOPY    . IR RADIOLOGIST EVAL & MGMT  04/04/2017  . KIDNEY SURGERY     ablation of renal cell CA - 12/28, prior one was October 2012-Dr. Kathlene Cote  . TEE WITHOUT CARDIOVERSION N/A 11/19/2013   Procedure: TRANSESOPHAGEAL ECHOCARDIOGRAM (TEE);  Surgeon: Lelon Perla, MD;  Location: [facility] END[facility];  Service: Cardiovascular;  Laterality: N/A;    His Family History Is Significant For: Family History  Problem Relation Age of Onset  . Diabetes Father   . Prostate cancer Other   . Stomach cancer Other   . Colon cancer Neg Hx   . Esophageal cancer Neg Hx   . Rectal cancer Neg Hx     His Social History Is Significant For: Social History   Social History  . Marital status: Single    Spouse name: N/A  . Number of children: 2  . Years of education: college   Occupational History  .  Unemployed   Social History Main Topics  . Smoking status: Never Smoker  . Smokeless tobacco: Never Used  . Alcohol use Yes     Comment: rarely  . Drug use: No  . Sexual activity: No   Other Topics Concern  . None   Social History Narrative   Patient lives at home with his family.   Education. Two years of college.   Not working.   Right handed.    Caffeine one soda daily. Drinks coffee     His Allergies Are:  Allergies  Allergen Reactions  . Morphine Itching  :   His Current Medications Are:  Outpatient Encounter Prescriptions as of 07/03/2017  Medication Sig  . aspirin 325 MG tablet Take 325 mg by mouth daily.   [name] atorvastatin (LIPITOR) 40 MG tablet Take 1 tablet (40 mg total) by mouth daily.  . blood glucose meter kit and supplies Dispense based on patient and insurance preference. Use up to four times daily as directed. (FOR ICD-9 250.00, 250.01).  . insulin glargine (LANTUS) 100 UNIT/ML injection Inject 40 Units into the skin 2 (two) times daily.  . Insulin Pen Needle (PEN NEEDLES)  32G X 4 MM MISC 1 each by Does not apply route daily. Use for insulin pens  . levETIRAcetam (KEPPRA) 500 MG tablet 1/2 tablet po BID (Patient taking differently: Take 150 mg by mouth daily. )  . lisinopril (PRINIVIL,ZESTRIL) 10 MG tablet Take 10 mg by mouth daily.  [name] topiramate (TOPAMAX) 50 MG tablet Take 1 tablet (50 mg total) by mouth 2 (two) times daily.  . [DISCONTINUED] LANTUS SOLOSTAR 100 UNIT/ML Solostar Pen INJECT 45 UNITS INTO THE SKIN DAILY AT 10 PM (Patient taking differently: Inject 40 Units into the skin daily at 10 pm. )   Facility-Administered Encounter Medications as of 07/03/2017  Medication  . 0.9 %  sodium chloride infusion  :  Review of Systems:  Out of a complete 14 point review of systems, all are reviewed and negative with the exception of these symptoms as listed below:  Review of Systems  Neurological:       Pt presents today to discuss his cpap. Pt says that Brandon [name] has a lot of nasal discharge that precludes him from using his cpap.    Objective:  Neurological Exam  Physical Exam Physical Examination:   Vitals:   07/03/17 0838  BP: (!) 170/98  Pulse: 71   General Examination: The patient is a very pleasant 56 y.o. male in no acute distress. Brandon [name] appears well-developed and well-nourished and well groomed.    HEENT: Normocephalic, atraumatic, pupils are equal, round and reactive to light and accommodation. Extraocular tracking is good without limitation to gaze excursion or nystagmus noted. Normal smooth pursuit is noted. Hearing is grossly intact. Face is symmetric with normal facial animation and normal facial sensation. Speech is clear but has intermittent stutter, tendency to exchange Ts for Ks, less than last time. There is no lip, neck/head, jaw or voice tremor. Neck is supple with full range of passive and active motion. There are no carotid bruits on auscultation. Oropharynx exam reveals: moderate mouth dryness, adequate dental hygiene and marked airway crowding. Mallampati is class III. Tongue protrudes centrally and palate elevates symmetrically. Brandon [name] has a Mild overbite. Nasal inspection shows moderate nasal crowding and inf turb hypertrophy.   Chest: Clear to auscultation without wheezing, rhonchi or crackles noted.  Heart: S1+S2+0, regular and normal without murmurs, rubs or gallops noted.   Abdomen: Soft, non-tender and non-distended with normal bowel sounds appreciated on auscultation.  Extremities: There is trace to 1+ pitting edema in the distal lower extremities bilaterally.   Skin: Warm and dry without trophic changes noted.  Musculoskeletal: exam reveals no obvious joint deformities, tenderness or joint swelling or erythema.   Neurologically:  Mental status: The patient is awake, alert and oriented in all 4 spheres. His immediate and remote memory, attention, language skills and fund of knowledge are appropriate. There is no evidence of aphasia, agnosia, apraxia or anomia. Speech is clear with normal prosody and enunciation. Thought process is linear. Mood is normal and affect is normal.  Cranial nerves II - XII are as described above under HEENT exam. In addition: shoulder shrug is normal with equal shoulder height noted. Motor exam: Normal bulk, strength and tone is noted.  There is no drift, tremor or rebound. Romberg is negative. Reflexes are 1+ throughout. Fine motor skills and coordination: intact on R, deliberate on L.  Cerebellar testing: No significant dysmetria or intention tremor.    Sensory exam: intact to light touch in the upper and lower extremities.  Gait, station and balance: Brandon [name] stands with difficulty and pushes  himself up. No veering to one side is noted. No leaning to one side is noted. Posture is age-appropriate and stance is narrow based. Gait shows widebased and awkward sliding gait, no limp per se, cannot do tandem walk and makes flapping movements with LUE while attempting tandem walk, similar to last time.    Assessment and Plan:   In summary, TILLMON KISLING is a very pleasant 56 year old male with an underlying medical history of ischemic stroke in December 2014 of the right MCA territory with mild residual left-sided weakness, anxiety, depression, diabetes, diverticulitis with recent exacerbation and hospitalization, ED, history of focal seizure, hyperlipidemia, hypertension, renal cell cancer, thyroid disease, and obesity, who returns for follow up consultation of his severe OSA, after sleep study testing in March and April 2018. We talked about his test results. Brandon [name] is unable to use CPAP and reports nasal clear drainage, congestion. Brandon [name] is advised to get in touch with his DME company for a mask refit and perhaps consider a different type interface. Brandon [name] is also advised to seek consultation with ENT. Brandon [name] is agreeable to this. I made a referral in that regard. I also advised him that since Brandon [name] has not been compliant with his CPAP his insurance may or may not continue payment for his CPAP machine. Physical exam is stable or slightly better than last time. Brandon [name] is advised to keep his appointment in September with Cecille Rubin for recheck and also compliance download and to see what suggestions ENT might have. I encouraged him to be fully compliant with CPAP  therapy and reminded him about the severity of his OSA and how it may tie in with heart disease and stroke risk. I answered all his questions today and Brandon [name] was in agreement. I spent 25 minutes in total face-to-face time with the patient, more than 50% of which was spent in counseling and coordination of care, reviewing test results, reviewing medication and discussing or reviewing the diagnosis of OSA, its prognosis and treatment options. Pertinent laboratory and imaging test results that were available during this visit with the patient were reviewed by me and considered in my medical decision making (see chart for details).

## 2017-07-03 NOTE — Patient Instructions (Addendum)
Please get in touch with your DME company about your mask issues and high leak.  Please use your CPAP regularly. While your insurance requires that you use CPAP at least 4 hours each night on 70% of the nights, I recommend, that you not skip any nights and use it throughout the night if you can. Getting used to CPAP and staying with the treatment long term does take time and patience and discipline. Untreated obstructive sleep apnea when it is moderate to severe can have an adverse impact on cardiovascular health and raise her risk for heart disease, arrhythmias, hypertension, congestive heart failure, stroke and diabetes. Untreated obstructive sleep apnea causes sleep disruption, nonrestorative sleep, and sleep deprivation. This can have an impact on your day to day functioning and cause daytime sleepiness and impairment of cognitive function, memory loss, mood disturbance, and problems focussing. Using CPAP regularly can improve these symptoms. As discussed, I will refer you to ENT for your nasal congestion and drainage.  Please follow up with Brandon [name] in September for a recheck.

## 2017-07-03 NOTE — Telephone Encounter (Signed)
Pt called and would like a referral for a therapist at [place], pt can be reached at [phone]

## 2017-07-07 NOTE — Telephone Encounter (Signed)
Called and l/m for pt call us back.  

## 2017-07-11 NOTE — Telephone Encounter (Signed)
Called and l/m for pt call us back.  

## 2017-08-15 ENCOUNTER — Ambulatory Visit: Payer: Medicaid Other | Admitting: Nurse Practitioner

## 2017-08-17 ENCOUNTER — Other Ambulatory Visit: Payer: Self-pay | Admitting: Neurology

## 2017-08-17 DIAGNOSIS — R51 Headache: Principal | ICD-10-CM

## 2017-08-17 DIAGNOSIS — R519 Headache, unspecified: Secondary | ICD-10-CM

## 2017-08-23 ENCOUNTER — Other Ambulatory Visit: Payer: Self-pay | Admitting: Medical

## 2017-08-30 ENCOUNTER — Encounter: Payer: Medicaid Other | Admitting: Medical

## 2017-09-08 NOTE — Telephone Encounter (Signed)
ERROR
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
## 2017-09-27 ENCOUNTER — Ambulatory Visit: Payer: Medicaid Other | Admitting: Nurse Practitioner

## 2017-10-03 ENCOUNTER — Institutional Professional Consult (permissible substitution): Payer: Medicaid Other | Admitting: Medical

## 2017-10-08 ENCOUNTER — Other Ambulatory Visit: Payer: Self-pay | Admitting: Medical

## 2017-10-10 ENCOUNTER — Encounter: Payer: Self-pay | Admitting: Medical

## 2017-10-10 ENCOUNTER — Ambulatory Visit: Payer: Medicare Other | Admitting: Medical

## 2017-10-10 VITALS — BP 130/90 | HR 99 | Temp 99.5°F | Wt 271.8 lb

## 2017-10-10 DIAGNOSIS — J988 Other specified respiratory disorders: Secondary | ICD-10-CM

## 2017-10-10 DIAGNOSIS — E0821 Diabetes mellitus due to underlying condition with diabetic nephropathy: Secondary | ICD-10-CM

## 2017-10-10 DIAGNOSIS — E1169 Type 2 diabetes mellitus with other specified complication: Secondary | ICD-10-CM | POA: Diagnosis not present

## 2017-10-10 DIAGNOSIS — Z794 Long term (current) use of insulin: Secondary | ICD-10-CM

## 2017-10-10 DIAGNOSIS — R05 Cough: Secondary | ICD-10-CM | POA: Diagnosis not present

## 2017-10-10 DIAGNOSIS — F339 Major depressive disorder, recurrent, unspecified: Secondary | ICD-10-CM | POA: Diagnosis not present

## 2017-10-10 DIAGNOSIS — R059 Cough, unspecified: Secondary | ICD-10-CM

## 2017-10-10 LAB — POCT GLYCOSYLATED HEMOGLOBIN (HGB A1C): Hemoglobin A1C: 11.1

## 2017-10-10 MED ORDER — SEMAGLUTIDE(0.25 OR 0.5MG/DOS) 2 MG/1.5ML ~~LOC~~ SOPN: 0.2500 mg | PEN_INJECTOR | SUBCUTANEOUS | 1 refills | Status: DC

## 2017-10-10 MED ORDER — PROMETHAZINE-DM 6.25-15 MG/5ML PO SYRP: 5.0000 mL | ORAL_SOLUTION | Freq: Four times a day (QID) | ORAL | 0 refills | Status: DC | PRN

## 2017-10-10 MED ORDER — INSULIN GLARGINE 100 UNIT/ML ~~LOC~~ SOLN: 50.0000 [IU] | Freq: Every day | SUBCUTANEOUS | 2 refills | Status: DC

## 2017-10-10 MED ORDER — AMOXICILLIN 500 MG PO TABS: ORAL_TABLET | ORAL | 0 refills | Status: DC

## 2017-10-10 NOTE — Progress Notes (Addendum)
Subjective: Chief Complaint  Patient presents with  . having congestion, sore throat    coughing , congestion, sore throat, chills   Here for 3 day hx/o cough, congestion, sore throat, chills.   Been around several sick family members recently.   Feels real congested in head and chest, lots of cough.   Drinking orange juice and hot tea.  Has felt feverish although he hasn't check it.   Not taking cough medication.  No wheezing, no vomiting, but has felt nauseated.  Taking Lantus 45u daily, but occasionally takes it BID.   Checking sugars at home.  Getting mid 200s regularly.  Hasn't seen endocrinology since 02/2017 as he owes them money.     Still dealing with depressed mood.  Tried a new therapist but they don't take his insurance.  He had seen [facility] a few times prior.  Past Medical History:  Diagnosis Date  . Acute ischemic stroke ([place]) 11/2013  . Allergy   . Anxiety   . Arthritis   . 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 Depression    BH Adm in [place] Depression  . Diabetes mellitus    DKA prior hospitalization  . Diverticulitis    s/p micorperforation Sept 2012-managed conservatively by Gen surgery  . ED (erectile dysfunction)   . Focal seizure ([employer]) 11/2013   due to ischemic stroke  . GERD (gastroesophageal reflux disease)   . Hiatal hernia   . Hyperlipidemia   . Hypertension   . Kidney tumor 09/2011   Renal cell CA  . Seizures ([place])    none since 2016, taking Keppra - maw  . Sleep apnea   . Thyroid disease    "weak thyroid" per MD  . Wears glasses    Current Outpatient Medications on File Prior to Visit  Medication Sig Dispense Refill  . aspirin 325 MG tablet Take 325 mg by mouth daily.     [name] atorvastatin (LIPITOR) 40 MG tablet Take 1 tablet (40 mg total) by mouth daily. 90 tablet 3  . blood glucose meter kit and supplies Dispense based on patient and insurance preference. Use up to four times daily as directed. (FOR ICD-9  250.00, 250.01). 1 each 0  . Insulin Pen Needle (PEN NEEDLES) 32G X 4 MM MISC 1 each by Does not apply route daily. Use for insulin pens 200 each 2  . LANTUS SOLOSTAR 100 UNIT/ML Solostar Pen INJECT 45 UNITS INTO THE SKIN DAILY AT 10 PM 15 pen 0  . levETIRAcetam (KEPPRA) 500 MG tablet 1/2 tablet po BID (Patient taking differently: Take 150 mg by mouth daily. ) 60 tablet 3  . lisinopril (PRINIVIL,ZESTRIL) 10 MG tablet TAKE 1 TABLET (10 MG TOTAL) BY MOUTH DAILY. 30 tablet 0   Current Facility-Administered Medications on File Prior to Visit  Medication Dose Route Frequency Provider Last Rate Last Dose  . 0.9 %  sodium chloride infusion  500 mL Intravenous Continuous Armbruster, Carlota Raspberry, MD       ROS as in subjective   Objective: BP 130/90   Pulse 99   Temp 99.5 F (37.5 C)   Wt 271 lb 12.8 oz (123.3 kg)   SpO2 96%   BMI 36.61 kg/m   General appearance: Alert, WD/WN, no distress, ill appearing                             Skin:  warm, no rash, no diaphoresis                           Head: no sinus tenderness                            Eyes: conjunctiva normal, corneas clear, PERRLA                            Ears: pearly TMs, external ear canals normal                          Nose: septum midline, turbinates swollen, with erythema and clear discharge             Mouth/throat: MMM, tongue normal, mild pharyngeal erythema                           Neck: supple, no adenopathy, no thyromegaly, non tender                          Heart: RRR, normal S1, S2, no murmurs                         Lungs: +bronchial breath sounds, +scattered rhonchi, no wheezes, no rales                Extremities: no edema, non tender        Assessment: Encounter Diagnoses  Name Primary?  . Cough Yes  . Respiratory tract infection   . Type 2 diabetes mellitus with other specified complication, with long-term current use of insulin ([place])   . Diabetes mellitus due to underlying condition with diabetic  nephropathy, with long-term current use of insulin ([place])   . Depression, recurrent ([place])      Plan: Cough, respiratory tract infection - begin promethazine, amoxicillin  Diabetes - not compliant.   HgbA1C 11%, increase Lantus to 50u QHS and add Ozempic.   C/t glucose monitoring.  Work on [employer], exercise.   Depression - advised he re-establish with [facility]  Parley was seen today for having congestion, sore throat.  Diagnoses and all orders for this visit:  Cough  Respiratory tract infection  Type 2 diabetes mellitus with other specified complication, with long-term current use of insulin ([place])  Diabetes mellitus due to underlying condition with diabetic nephropathy, with long-term current use of insulin ([facility]) -     HgB A1c  Depression, recurrent ([facility])  Other orders -     promethazine-dextromethorphan (PROMETHAZINE-DM) 6.25-15 MG/5ML syrup; Take 5 mLs 4 (four) times daily as needed by mouth for cough. -     amoxicillin (AMOXIL) 500 MG tablet; 2 tablets po BID x 10 days -     Semaglutide (OZEMPIC) 0.25 or 0.5 MG/DOSE SOPN; Inject 0.25 mg once a week into the skin. -     insulin glargine (LANTUS) 100 UNIT/ML injection; Inject 0.5 mLs (50 Units total) daily into the skin.   F/u 28mo sooner prn

## 2017-10-19 ENCOUNTER — Telehealth: Payer: Self-pay | Admitting: Medical

## 2017-10-19 NOTE — Telephone Encounter (Signed)
pls refill 

## 2017-10-19 NOTE — Telephone Encounter (Signed)
Requesting refill on Promethazine DM cough syrup. He still has the same cough that disturbs his talking.

## 2017-10-20 NOTE — Telephone Encounter (Signed)
Called in to [facility] 

## 2017-10-21 ENCOUNTER — Telehealth: Payer: Self-pay | Admitting: Medical

## 2017-10-21 NOTE — Telephone Encounter (Signed)
Called [facility] [facility]ZEMP[facility] t# [phone] and it was denied due to pt has Medicare D coverage and must be Medicaid only covers medications that are not covered by Medicare part D. Called pharmacy and had them reprocess with Medicare part D and went thru for $3.  Left message for pt

## 2017-11-18 ENCOUNTER — Other Ambulatory Visit: Payer: Self-pay | Admitting: Medical

## 2017-11-22 ENCOUNTER — Telehealth: Payer: Self-pay | Admitting: Medical

## 2017-11-22 NOTE — Telephone Encounter (Signed)
Pt came in and stated that he saw Dr. Patrice Paradise at [facility] today. Dr. Patrice Paradise would like pt to have a knee drop lock brace. Pt states Dr. Patrice Paradise stated he would need pcp to write. Pt can be reached at [phone].

## 2017-11-23 NOTE — Telephone Encounter (Signed)
He has an orthopedist he saw for knee issues in May and is due f/u there.  So have him make f/u with orthopedist on this issue.

## 2017-11-24 NOTE — Telephone Encounter (Signed)
Pt informed

## 2017-11-28 NOTE — Progress Notes (Signed)
[facility]  PATIENT: Brandon [name] DOB: August 10, 1961   REASON FOR VISIT: Follow-up for seizure disorder, history of stroke, obstructive sleep apnea currently not using CPAP HISTORY FROM: Patient and significant other    HISTORY OF PRESENT ILLNESS:Brandon [name] is a 56 year old right-handed gentleman with an underlying medical history of ischemic stroke in December 2014 of the right MCA territory with mild residual left-sided weakness, anxiety, depression, diabetes, diverticulitis, ED, history of focal seizure, hyperlipidemia, hypertension, renal cell cancer, thyroid disease, and obesity, who presents for follow-up consultation of his obstructive sleep apnea, after recent sleep study testing. The patient is unaccompanied today. I first met him on 02/08/2017 at the request of Dr. Leonie Man, at which time the patient reported snoring, excessive daytime somnolence as well as witnessed apneas. He was advised to come back for sleep study testing. He had a baseline sleep study, followed by a CPAP titration study. His baseline sleep study from 02/23/2017 showed a sleep efficiency of 93.6 minutes, sleep latency 17 minutes, REM sleep was absent. He had near absence of slow-wave sleep, total AHI was 29.3 per hour, average oxygen saturation 95%, nadir in non-REM sleep was 79%. He had very mild PLMS with no significant arousals. Based on his medical history and sleep-related complaints as well as test results he was invited for a full night CPAP titration study, which he had on 03/22/2017, sleep efficiency was 87.4%, sleep latency 1 minute, REM latency reduced at 46.5 minutes, REM percentage was 23%, slow-wave sleep was 12.1%. He was titrated from 7 cm to 9 cm via full facemask. AHI on the final pressure was 0 per hour with nonsupine REM sleep achieved an O2 nadir of 94%. He had no significant PLMS during the study and indicated that he felt better after using CPAP. We called him in the interim on  05/10/2017 due to noncompliance with his CPAP. He reported that he was recently in the hospital. He was also advised to get in touch with his DME company to get a better mask or a effect.  Today, 07/03/2017 (all dictated new, as well as above notes, some dictation done in note pad or Word, outside of chart, may appear as copied): I reviewed his CPAP compliance data from 05/28/2017 through 06/26/2017 which is a total of 30 days, during which time he had a usage of one day with an average of 1 hour and 15 minutes only, essentially noncompliant, in the past 90 days he used his CPAP 22 days with 2% days greater than 4 hours was essentially noncompliant, AHI at 18.8, leak high, pressure at 9 cm. He reports that he has a lot of nasal discharge, clear, but he feels unable to use his CPAP. Of note, he was hospitalized in June secondary to sigmoid diverticulitis. UPDATE 12/26/2018[name], 56 year old male returns for follow-up history of stroke in 2014.  He also has history of seizure disorder and is on Keppra 500 mg half tablet twice a day. and reports recent tongue biting however he did not call the office.    He is currently not using his CPAP because his mask does not fit.  He claims his blood sugars range from 112-400.  He remains on Lantus.  He is on aspirin and Lipitor for secondary stroke prevention.  He has not had side effects of bruising or bleeding or myalgias.  He denies any recent falls.  He has a brace to his left knee.  He returns for reevaluation  REVIEW OF SYSTEMS:  Full 14 system review of systems performed and notable only for those listed, all others are neg:  Constitutional: Fatigue Cardiovascular: neg Ear/Nose/Throat: Hearing loss Skin: neg Eyes: neg Respiratory: neg Gastroitestinal: neg  Hematology/Lymphatic: neg  Endocrine: neg Musculoskeletal: Walking difficulty Allergy/Immunology: neg Neurological: Seizure disorder, headache Psychiatric: Depression anxiety Sleep :  Obstructive sleep apnea currently not using CPAP   ALLERGIES: Allergies  Allergen Reactions  . Morphine Itching    HOME MEDICATIONS: Outpatient Medications Prior to Visit  Medication Sig Dispense Refill  . aspirin 325 MG tablet Take 325 mg by mouth daily.     [name] atorvastatin (LIPITOR) 40 MG tablet Take 1 tablet (40 mg total) by mouth daily. 90 tablet 3  . blood glucose meter kit and supplies Dispense based on patient and insurance preference. Use up to four times daily as directed. (FOR ICD-9 250.00, 250.01). 1 each 0  . insulin glargine (LANTUS) 100 UNIT/ML injection Inject 0.5 mLs (50 Units total) daily into the skin. 10 mL 2  . Insulin Pen Needle (PEN NEEDLES) 32G X 4 MM MISC 1 each by Does not apply route daily. Use for insulin pens 200 each 2  . LANTUS SOLOSTAR 100 UNIT/ML Solostar Pen INJECT 45 UNITS INTO THE SKIN DAILY AT 10 PM 15 pen 0  . levETIRAcetam (KEPPRA) 500 MG tablet 1/2 tablet po BID (Patient taking differently: Take 150 mg by mouth daily. ) 60 tablet 3  . lisinopril (PRINIVIL,ZESTRIL) 10 MG tablet TAKE 1 TABLET BY MOUTH EVERY DAY 60 tablet 1  . Semaglutide (OZEMPIC) 0.25 or 0.5 MG/DOSE SOPN Inject 0.25 mg once a week into the skin. 1.5 mL 1  . amoxicillin (AMOXIL) 500 MG tablet 2 tablets po BID x 10 days 40 tablet 0  . promethazine-dextromethorphan (PROMETHAZINE-DM) 6.25-15 MG/5ML syrup Take 5 mLs 4 (four) times daily as needed by mouth for cough. 120 mL 0   Facility-Administered Medications Prior to Visit  Medication Dose Route Frequency Provider Last Rate Last Dose  . 0.9 %  sodium chloride infusion  500 mL Intravenous Continuous Armbruster, Carlota Raspberry, MD        PAST MEDICAL HISTORY: Past Medical History:  Diagnosis Date  . Acute ischemic stroke ([place]) 11/2013  . Allergy   . Anxiety   . Arthritis   . 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 Depression    BH Adm in [place] Depression  . Diabetes mellitus    DKA prior  hospitalization  . Diverticulitis    s/p micorperforation Sept 2012-managed conservatively by Gen surgery  . ED (erectile dysfunction)   . Focal seizure ([place]) 11/2013   due to ischemic stroke  . GERD (gastroesophageal reflux disease)   . Hiatal hernia   . Hyperlipidemia   . Hypertension   . Kidney tumor 09/2011   Renal cell CA  . Seizures ([place])    none since 2016, taking Keppra - maw  . Sleep apnea   . Thyroid disease    "weak thyroid" per MD  . Wears glasses     PAST SURGICAL HISTORY: Past Surgical History:  Procedure Laterality Date  . COLONOSCOPY    . IR RADIOLOGIST EVAL & MGMT  04/04/2017  . KIDNEY SURGERY     ablation of renal cell CA - 12/28, prior one was October 2012-Dr. Kathlene Cote  . TEE WITHOUT CARDIOVERSION N/A 11/19/2013   Procedure: TRANSESOPHAGEAL ECHOCARDIOGRAM (TEE);  Surgeon: Lelon Perla, MD;  Location: [place];  Service: Cardiovascular;  Laterality: N/A;    FAMILY HISTORY: Family History  Problem Relation Age of Onset  . Diabetes Father   . Prostate cancer Other   . Stomach cancer Other   . Colon cancer Neg Hx   . Esophageal cancer Neg Hx   . Rectal cancer Neg Hx     SOCIAL HISTORY: Social History   Socioeconomic History  . Marital status: Single    Spouse name: Not on file  . Number of children: 2  . Years of education: college  . Highest education level: Not on file  Social Needs  . Financial resource strain: Not on file  . Food insecurity - worry: Not on file  . Food insecurity - inability: Not on file  . Transportation needs - medical: Not on file  . Transportation needs - non-medical: Not on file  Occupational History    Employer: UNEMPLOYED  Tobacco Use  . Smoking status: Never Smoker  . Smokeless tobacco: Never Used  Substance and Sexual Activity  . Alcohol use: Yes    Comment: rarely  . Drug use: No  . Sexual activity: No  Other Topics Concern  . Not on file  Social History Narrative   Patient lives at home with his  family.   Education. Two years of college.   Not working.   Right handed.   Caffeine one soda daily. Drinks coffee      PHYSICAL EXAM  Vitals:   11/29/17 0901  BP: (!) 152/98  Pulse: 81  Weight: 265 lb 9.6 oz (120.5 kg)  Height: 6' 0.25" (1.835 m)   Body mass index is 35.77 kg/m.  Generalized: Well developed, in no acute distress , well groomed Head: normocephalic and atraumatic,. Oropharynx benign  Neck: Supple, no carotid bruits  Cardiac: Regular rate rhythm, no murmur  Musculoskeletal: No deformity  Skin trace pitting edema in both feet  Neurological examination   Mentation: Alert oriented to time, place, history taking. Attention span and concentration appropriate. Recent and remote memory intact.  Follows all commands speech is clear but has intermittent stutter    Cranial nerve II-XII: .Pupils were equal round reactive to light extraocular movements were full, visual field were full on confrontational test. Facial sensation and strength were normal. hearing was intact to finger rubbing bilaterally. Uvula tongue midline. head turning and shoulder shrug were normal and symmetric.Tongue protrusion into cheek strength was normal. Motor: normal bulk and tone, full strength in the BUE, BLE, fine finger movements normal, no pronator drift. No focal weakness Sensory: normal and symmetric to light touch, in the upper and lower extremities Coordination: finger-nose-finger, heel-to-shin bilaterally, no dysmetria, no tremor Reflexes: 1+ upper lower and symmetric, plantar responses were flexor bilaterally. Gait and Station: Rising up from seated position with pushoff ,wide-based gait, no assistive device no difficulty with turns.  Unable to tandem DIAGNOSTIC DATA (LABS, IMAGING, TESTING) - I reviewed patient records, labs, notes, testing and imaging myself where available.  Lab Results  Component Value Date   WBC 4.5 05/23/2017   HGB 13.0 (L) 05/23/2017   HCT 38.6 05/23/2017    MCV 87.3 05/23/2017   PLT 244 05/23/2017      Component Value Date/Time   NA 142 06/02/2017 0719   K 3.9 06/02/2017 0719   CL 109 06/02/2017 0719   CO2 20 06/02/2017 0719   GLUCOSE 111 (H) 06/02/2017 0719   BUN 21 06/02/2017 0719   CREATININE 1.23 06/02/2017 0719   CALCIUM 9.4 06/02/2017 0719   PROT 6.7  05/13/2017 0357   ALBUMIN 3.6 05/13/2017 0357   AST 17 05/13/2017 0357   ALT 18 05/13/2017 0357   ALKPHOS 95 05/13/2017 0357   BILITOT 1.3 (H) 05/13/2017 0357   GFRNONAA >60 05/14/2017 0357   GFRNONAA 86 05/21/2013 1045   GFRAA >60 05/14/2017 0357   GFRAA >89 05/21/2013 1045   Lab Results  Component Value Date   CHOL 156 12/29/2016   HDL 39 (L) 12/29/2016   LDLCALC 92 12/29/2016   TRIG 123 12/29/2016   CHOLHDL 4.0 12/29/2016   Lab Results  Component Value Date   HGBA1C 11.1% 10/10/2017   Lab Results  Component Value Date   VITAMINB12 365 12/29/2016   Lab Results  Component Value Date   TSH 0.41 12/29/2016      ASSESSMENT AND PLAN 83year African-American male with remote history of right middle cerebral artery infarct  of cryptogenic etiology and partial seizures. Chronic headaches likely mixed tension headaches with vascular component.  Obstructive sleep apnea currently not compliant with CPAP. The patient is a current patient of Dr. Leonie Man and Rexene Alberts   who are  out of the office today . This note is sent to the work in doctor.    PLAN: Keppra level today Continue Keppra at current dose for now will refill Continue aspirin and Lipitor for secondary stroke prevention, LDL cholesterol goal below 70 mg percent, diabetes with hemoglobin A1c goal below 6.5 and blood pressure goal below 130/90.  Patient currently not using CPAP, stop by the sleep lab on the way out to get a mask fitting Keep blood sugars in good control Follow-up in 3 months for CPAP compliance I spent 25 minutes in total face to face time with the patient more than 50% of which was spent counseling and  coordination of care, reviewing test results reviewing medications and discussing and reviewing the diagnosis of stroke and management of risk factors, seizure disorder and importance of being compliant with CPAP Dennie Bible, [facility], [facility], APRN  [facility] [address] [place], [place] [address] [phone]

## 2017-11-29 ENCOUNTER — Encounter: Payer: Self-pay | Admitting: Nurse Practitioner

## 2017-11-29 ENCOUNTER — Ambulatory Visit (INDEPENDENT_AMBULATORY_CARE_PROVIDER_SITE_OTHER): Payer: Medicare Other | Admitting: Nurse Practitioner

## 2017-11-29 VITALS — BP 152/98 | HR 81 | Ht 72.25 in | Wt 265.6 lb

## 2017-11-29 DIAGNOSIS — E785 Hyperlipidemia, unspecified: Secondary | ICD-10-CM

## 2017-11-29 DIAGNOSIS — I10 Essential (primary) hypertension: Secondary | ICD-10-CM | POA: Diagnosis not present

## 2017-11-29 DIAGNOSIS — Z8673 Personal history of transient ischemic attack (TIA), and cerebral infarction without residual deficits: Secondary | ICD-10-CM | POA: Insufficient documentation

## 2017-11-29 DIAGNOSIS — G4733 Obstructive sleep apnea (adult) (pediatric): Secondary | ICD-10-CM | POA: Diagnosis not present

## 2017-11-29 DIAGNOSIS — Z87898 Personal history of other specified conditions: Secondary | ICD-10-CM | POA: Diagnosis not present

## 2017-11-29 DIAGNOSIS — E1169 Type 2 diabetes mellitus with other specified complication: Secondary | ICD-10-CM | POA: Diagnosis not present

## 2017-11-29 MED ORDER — LEVETIRACETAM 500 MG PO TABS: ORAL_TABLET | ORAL | 1 refills | Status: DC

## 2017-11-29 NOTE — Patient Instructions (Signed)
Keppra level today Continue Keppra at current dose for now Continue aspirin and Lipitor for secondary stroke prevention Patient currently not using CPAP, stop by the sleep lab on the way out to get a mask fitting Keep blood sugars in good control Follow-up in 3 months for CPAP compliance

## 2017-11-29 NOTE — Progress Notes (Signed)
I have read the note, and I agree with the clinical assessment and plan.  Isolde Skaff K Kentley Cedillo   

## 2017-11-30 LAB — LEVETIRACETAM LEVEL: Levetiracetam Lvl: NOT DETECTED ug/mL (ref 10.0–40.0)

## 2017-12-01 ENCOUNTER — Telehealth: Payer: Self-pay | Admitting: *Deleted

## 2017-12-01 DIAGNOSIS — R569 Unspecified convulsions: Secondary | ICD-10-CM

## 2017-12-01 NOTE — Telephone Encounter (Signed)
Spoke to pt and relayed that no keppra in system (blood level).  He is to start taking 1/2 po bid (250mg  po bid), he states he is taking now since 11-29-17, as he was out of medication when he was in the office.  I relayed that CM/NP wants to check level again in 2 wks to make sure in normal level range.   Do not drive until after level rechecked, as was not taking medication and is high risk for having seizures. He verbalized understanding and will come in 2 wks for repeat lab and will bring med with him and have lab drawn prior to taking medication

## 2017-12-01 NOTE — Telephone Encounter (Signed)
-----   Message from Brandon Bible, NP sent at 11/30/2017  4:54 PM EST ----- No Keppra detected in blood.  Please take medication as prescribed and have level repeated in 2 weeks.  You are at risk for seizures.  Do not drive

## 2018-02-01 ENCOUNTER — Emergency Department (HOSPITAL_COMMUNITY): Payer: Medicare Other

## 2018-02-01 ENCOUNTER — Inpatient Hospital Stay (HOSPITAL_COMMUNITY)
Admission: EM | Admit: 2018-02-01 | Discharge: 2018-02-06 | DRG: 682 | Disposition: A | Discharge status: Home / Self Care | Payer: Medicare Other | Attending: Family Medicine | Admitting: Family Medicine

## 2018-02-01 ENCOUNTER — Other Ambulatory Visit: Payer: Self-pay

## 2018-02-01 ENCOUNTER — Encounter (HOSPITAL_COMMUNITY): Payer: Self-pay | Admitting: Emergency Medicine

## 2018-02-01 DIAGNOSIS — G40909 Epilepsy, unspecified, not intractable, without status epilepticus: Secondary | ICD-10-CM | POA: Diagnosis present

## 2018-02-01 DIAGNOSIS — N179 Acute kidney failure, unspecified: Secondary | ICD-10-CM | POA: Diagnosis not present

## 2018-02-01 DIAGNOSIS — E1165 Type 2 diabetes mellitus with hyperglycemia: Secondary | ICD-10-CM | POA: Diagnosis present

## 2018-02-01 DIAGNOSIS — Z87898 Personal history of other specified conditions: Secondary | ICD-10-CM

## 2018-02-01 DIAGNOSIS — I69359 Hemiplegia and hemiparesis following cerebral infarction affecting unspecified side: Secondary | ICD-10-CM

## 2018-02-01 DIAGNOSIS — F39 Unspecified mood [affective] disorder: Secondary | ICD-10-CM | POA: Diagnosis present

## 2018-02-01 DIAGNOSIS — I152 Hypertension secondary to endocrine disorders: Secondary | ICD-10-CM | POA: Diagnosis present

## 2018-02-01 DIAGNOSIS — E43 Unspecified severe protein-calorie malnutrition: Secondary | ICD-10-CM

## 2018-02-01 DIAGNOSIS — E1122 Type 2 diabetes mellitus with diabetic chronic kidney disease: Secondary | ICD-10-CM | POA: Diagnosis not present

## 2018-02-01 DIAGNOSIS — R112 Nausea with vomiting, unspecified: Secondary | ICD-10-CM | POA: Diagnosis present

## 2018-02-01 DIAGNOSIS — Z794 Long term (current) use of insulin: Secondary | ICD-10-CM

## 2018-02-01 DIAGNOSIS — Z79899 Other long term (current) drug therapy: Secondary | ICD-10-CM

## 2018-02-01 DIAGNOSIS — Z23 Encounter for immunization: Secondary | ICD-10-CM

## 2018-02-01 DIAGNOSIS — N183 Chronic kidney disease, stage 3 unspecified: Secondary | ICD-10-CM | POA: Diagnosis present

## 2018-02-01 DIAGNOSIS — R1032 Left lower quadrant pain: Secondary | ICD-10-CM | POA: Diagnosis not present

## 2018-02-01 DIAGNOSIS — I129 Hypertensive chronic kidney disease with stage 1 through stage 4 chronic kidney disease, or unspecified chronic kidney disease: Secondary | ICD-10-CM | POA: Diagnosis present

## 2018-02-01 DIAGNOSIS — Z885 Allergy status to narcotic agent status: Secondary | ICD-10-CM

## 2018-02-01 DIAGNOSIS — R1010 Upper abdominal pain, unspecified: Secondary | ICD-10-CM | POA: Diagnosis not present

## 2018-02-01 DIAGNOSIS — K3184 Gastroparesis: Secondary | ICD-10-CM | POA: Diagnosis present

## 2018-02-01 DIAGNOSIS — E1143 Type 2 diabetes mellitus with diabetic autonomic (poly)neuropathy: Secondary | ICD-10-CM | POA: Diagnosis present

## 2018-02-01 DIAGNOSIS — I10 Essential (primary) hypertension: Secondary | ICD-10-CM | POA: Diagnosis present

## 2018-02-01 DIAGNOSIS — R05 Cough: Secondary | ICD-10-CM | POA: Diagnosis not present

## 2018-02-01 DIAGNOSIS — R109 Unspecified abdominal pain: Secondary | ICD-10-CM

## 2018-02-01 DIAGNOSIS — R0789 Other chest pain: Secondary | ICD-10-CM | POA: Diagnosis not present

## 2018-02-01 DIAGNOSIS — Z85528 Personal history of other malignant neoplasm of kidney: Secondary | ICD-10-CM

## 2018-02-01 DIAGNOSIS — IMO0002 Reserved for concepts with insufficient information to code with codable children: Secondary | ICD-10-CM

## 2018-02-01 DIAGNOSIS — E1159 Type 2 diabetes mellitus with other circulatory complications: Secondary | ICD-10-CM | POA: Diagnosis present

## 2018-02-01 DIAGNOSIS — Z6832 Body mass index (BMI) 32.0-32.9, adult: Secondary | ICD-10-CM

## 2018-02-01 DIAGNOSIS — R1011 Right upper quadrant pain: Secondary | ICD-10-CM | POA: Diagnosis not present

## 2018-02-01 DIAGNOSIS — Z7982 Long term (current) use of aspirin: Secondary | ICD-10-CM

## 2018-02-01 DIAGNOSIS — R1111 Vomiting without nausea: Secondary | ICD-10-CM | POA: Diagnosis not present

## 2018-02-01 LAB — COMPREHENSIVE METABOLIC PANEL
ALK PHOS: 123 U/L (ref 38–126)
ALT: 19 U/L (ref 17–63)
ANION GAP: 16 — AB (ref 5–15)
AST: 17 U/L (ref 15–41)
Albumin: 4.6 g/dL (ref 3.5–5.0)
BILIRUBIN TOTAL: 2.3 mg/dL — AB (ref 0.3–1.2)
BUN: 60 mg/dL — AB (ref 6–20)
CALCIUM: 9.3 mg/dL (ref 8.9–10.3)
CO2: 27 mmol/L (ref 22–32)
Chloride: 97 mmol/L — ABNORMAL LOW (ref 101–111)
Creatinine, Ser: 6.05 mg/dL — ABNORMAL HIGH (ref 0.61–1.24)
GFR calc Af Amer: 11 mL/min — ABNORMAL LOW (ref >60)
GFR, EST NON AFRICAN AMERICAN: 9 mL/min — AB (ref >60)
GLUCOSE: 287 mg/dL — AB (ref 65–99)
Potassium: 4.5 mmol/L (ref 3.5–5.1)
Sodium: 140 mmol/L (ref 135–145)
TOTAL PROTEIN: 8.4 g/dL — AB (ref 6.5–8.1)

## 2018-02-01 LAB — CBC
HCT: 40.4 % (ref 39.0–52.0)
Hemoglobin: 14.4 g/dL (ref 13.0–17.0)
MCH: 30.6 pg (ref 26.0–34.0)
MCHC: 35.6 g/dL (ref 30.0–36.0)
MCV: 86 fL (ref 78.0–100.0)
Platelets: 275 10*3/uL (ref 150–400)
RBC: 4.7 MIL/uL (ref 4.22–5.81)
RDW: 11.8 % (ref 11.5–15.5)
WBC: 6.1 10*3/uL (ref 4.0–10.5)

## 2018-02-01 LAB — LIPASE, BLOOD: Lipase: 38 U/L (ref 11–51)

## 2018-02-01 LAB — TROPONIN I

## 2018-02-01 MED ORDER — FENTANYL CITRATE (PF) 100 MCG/2ML IJ SOLN
50.0000 ug | Freq: Once | INTRAMUSCULAR | Status: AC
  Administered 2018-02-01: 50 ug via INTRAVENOUS
  Filled 2018-02-01: qty 2

## 2018-02-01 MED ORDER — ONDANSETRON HCL 4 MG/2ML IJ SOLN
4.0000 mg | Freq: Once | INTRAMUSCULAR | Status: AC
  Administered 2018-02-01: 4 mg via INTRAVENOUS
  Filled 2018-02-01: qty 2

## 2018-02-01 MED ORDER — SODIUM CHLORIDE 0.9 % IV BOLUS (SEPSIS)
1000.0000 mL | Freq: Once | INTRAVENOUS | Status: AC
  Administered 2018-02-01: 1000 mL via INTRAVENOUS

## 2018-02-01 NOTE — ED Provider Notes (Signed)
Medical screening examination/treatment/procedure(s) were conducted as a shared visit with non-physician practitioner(s) and myself.  I personally evaluated the patient during the encounter.   EKG Interpretation  Date/Time:  Thursday February 01 2018 22:23:02 EST Ventricular Rate:  94 PR Interval:    QRS Duration: 88 QT Interval:  346 QTC Calculation: 433 R Axis:   60 Text Interpretation:  Sinus rhythm Prolonged PR interval Anteroseptal infarct, old Borderline repolarization abnormality Borderline ST elevation, lateral leads Baseline wander in lead(s) I III aVL V3 V5 V6 No significant change since last tracing Confirmed by Deno Etienne [id number]) on 02/01/2018 10:33:07 PM        See the written copy of this report in the patient's paper medical record.  These results did not interface directly into the electronic medical record and are summarized here.  Procedure note: Ultrasound Guided Peripheral IV Ultrasound guided peripheral 1.88 inch angiocath IV placement performed by me. Indications: Nursing unable to place IV. Details: The antecubital fossa and upper arm were evaluated with a multifrequency linear probe. Patent brachial veins were noted. 1 attempt was made to cannulate a vein under realtime US guidance with successful cannulation of the vein and catheter placement. There is return of non-pulsatile dark red blood. The patient tolerated the procedure well without complications. Images archived electronically.  CPT codes: [phone] and [phone]  57 yo M with a chief complaint of weakness epigastric pain fatigue going on for the past couple weeks.  Patient has been having difficulty eating and drinking.  Denies fevers or chills.  My exam with very mild RUQ pain.  Negative murphys. Felt that his food tasted funny.  Has had a history of chronic right upper quadrant pain after having right renal cancer.  Labs here are concerning for acute renal failure.  Will give IV fluids.  Admit.   Deno Etienne,  DO 02/02/18 1519

## 2018-02-01 NOTE — ED Notes (Signed)
Pt has a urinal 

## 2018-02-01 NOTE — ED Triage Notes (Signed)
Pt complaint of abdominal pain with n/v and decreased urine output for a week. Denies diarrhea.

## 2018-02-01 NOTE — ED Provider Notes (Signed)
[facility] DEPT Provider Note   CSN: [id number] Arrival date & time: 02/01/18  1527     History   Chief Complaint Chief Complaint  Patient presents with  . Abdominal Pain    HPI Brandon [name] is a 57 y.o. male.  HPI   Patient is a 57 year old male with a history of CVA, bilateral renal cancer and subsequent cryoablation's in 2012, type 2 diabetes mellitus, diverticulitis, seizures, and anxiety presenting for nausea, emesis, upper abdominal pain, and decreased urine output.  Patient reports that he has had nausea and difficulty maintaining p.o. intake for 2 weeks.  Patient denies diarrhea.  Patient reports that for the past 2 days he has had upper abdominal pain for about 2 days in a bandlike pattern across his upper abdomen.  Patient reports it is worse after he vomits, however it is present all the time and worsening today.  Patient reports that his last weight he would consider to be a normal urination was 2 days ago.  Patient does not feel that he has difficulty urinating, just that he has not produced any urine.  Patient denies fevers, chills, or recent sick contacts.  Patient also reporting that he has a pressure-like sensation that has been worsening over the past day in his chest.  Patient also reporting increased "reflux" where he feels burning in the epigastrium.  Patient also reporting increased shortness of breath with exertion.  Patient also reporting approximately 30 pound weight loss within the past month.  Past Medical History:  Diagnosis Date  . Acute ischemic stroke ([place]) 11/2013  . Allergy   . Anxiety   . Arthritis   . 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 Depression    BH Adm in [place] Depression  . Diabetes mellitus    DKA prior hospitalization  . Diverticulitis    s/p micorperforation Sept 2012-managed conservatively by Gen surgery  . ED (erectile dysfunction)   . Focal seizure ([place])  11/2013   due to ischemic stroke  . GERD (gastroesophageal reflux disease)   . Hiatal hernia   . Hyperlipidemia   . Hypertension   . Kidney tumor 09/2011   Renal cell CA  . Seizures ([place])    none since 2016, taking Keppra - maw  . Sleep apnea   . Thyroid disease    "weak thyroid" per MD  . Wears glasses     Patient Active Problem List   Diagnosis Date Noted  . History of stroke 11/29/2017  . Obstructive sleep apnea 11/29/2017  . Acute renal failure ([place]) 05/23/2017  . Nausea 05/23/2017  . Acute renal failure superimposed on stage 3 chronic kidney disease ([place]) 05/10/2017  . Diabetes mellitus ([place]) 05/10/2017  . Dyslipidemia associated with type 2 diabetes mellitus ([place]) 05/10/2017  . Essential hypertension 05/10/2017  . Diverticulitis 05/10/2017  . History of cerebrovascular accident (CVA) with residual deficit 05/10/2017  . Diabetic ulcer of left foot associated with secondary diabetes mellitus ([place]) 04/21/2016  . History of seizure 01/05/2016  . Depression, recurrent ([place]) 09/21/2014  . Acute ischemic stroke ([place]) 11/16/2013  . Renal cell cancer ([place]) 12/30/2011  . Abdominal pain 12/30/2011    Past Surgical History:  Procedure Laterality Date  . COLONOSCOPY    . IR RADIOLOGIST EVAL & MGMT  04/04/2017  . KIDNEY SURGERY     ablation of renal cell CA - 12/28, prior one was October 2012-Dr. Kathlene Cote  . TEE WITHOUT  CARDIOVERSION N/A 11/19/2013   Procedure: TRANSESOPHAGEAL ECHOCARDIOGRAM (TEE);  Surgeon: Lelon Perla, MD;  Location: [facility] ENDOSCOPY;  Service: Cardiovascular;  Laterality: N/A;       Home Medications    Prior to Admission medications   Medication Sig Start Date End Date Taking? Authorizing Provider  aspirin 325 MG tablet Take 325 mg by mouth daily.    Yes [provider]  atorvastatin (LIPITOR) 40 MG tablet Take 1 tablet (40 mg total) by mouth daily. 11/04/16  Yes Tysinger, Camelia Eng, PA-C  LANTUS SOLOSTAR 100 UNIT/ML Solostar Pen INJECT 45 UNITS  INTO THE SKIN DAILY AT 10 PM 08/24/17  Yes Tysinger, Camelia Eng, PA-C  levETIRAcetam (KEPPRA) 500 MG tablet 1/2 tablet po BID Patient taking differently: Take 1,000 mg by mouth daily.  11/29/17  Yes Dennie Bible, NP  lisinopril (PRINIVIL,ZESTRIL) 10 MG tablet TAKE 1 TABLET BY MOUTH EVERY DAY 11/20/17  Yes Tysinger, Camelia Eng, PA-C  Semaglutide (OZEMPIC) 0.25 or 0.5 MG/DOSE SOPN Inject 0.25 mg once a week into the skin. 10/10/17  Yes Tysinger, Camelia Eng, PA-C  blood glucose meter kit and supplies Dispense based on patient and insurance preference. Use up to four times daily as directed. (FOR ICD-9 250.00, 250.01). 04/24/16   Eugenie Filler, MD  insulin glargine (LANTUS) 100 UNIT/ML injection Inject 0.5 mLs (50 Units total) daily into the skin. Patient not taking: Reported on 02/01/2018 10/10/17   Tysinger, Camelia Eng, PA-C  Insulin Pen Needle (PEN NEEDLES) 32G X 4 MM MISC 1 each by Does not apply route daily. Use for insulin pens 05/24/16   Tysinger, Camelia Eng, PA-C    Family History Family History  Problem Relation Age of Onset  . Diabetes Father   . Prostate cancer Other   . Stomach cancer Other   . Colon cancer Neg Hx   . Esophageal cancer Neg Hx   . Rectal cancer Neg Hx     Social History Social History   Tobacco Use  . Smoking status: Never Smoker  . Smokeless tobacco: Never Used  Substance Use Topics  . Alcohol use: Yes    Comment: rarely  . Drug use: No     Allergies   Morphine   Review of Systems Review of Systems  Constitutional: Negative for chills and fever.  HENT: Negative for congestion, rhinorrhea and trouble swallowing.   Respiratory: Positive for chest tightness and shortness of breath.   Cardiovascular: Positive for chest pain.  Gastrointestinal: Positive for abdominal pain, nausea and vomiting. Negative for abdominal distention.  Genitourinary: Positive for decreased urine volume. Negative for difficulty urinating, dysuria, flank pain and hematuria.    Musculoskeletal: Negative for back pain.  Skin: Negative for rash.  Neurological: Negative for headaches.     Physical Exam Updated Vital Signs BP (!) 135/115 (BP Location: Right Arm)   Pulse 71   Temp 98.6 F (37 C) (Oral)   Resp 18   SpO2 99%   Physical Exam  Constitutional: He appears well-developed and well-nourished. No distress.  Generally ill-appearing  HENT:  Head: Normocephalic and atraumatic.  Mouth/Throat: Oropharynx is clear and moist.  Eyes: Conjunctivae and EOM are normal. Pupils are equal, round, and reactive to light.  Neck: Normal range of motion. Neck supple.  Cardiovascular: Normal rate, regular rhythm, S1 normal and S2 normal.  No murmur heard. Pulmonary/Chest: Effort normal and breath sounds normal. He has no wheezes. He has no rales.  Abdominal: Soft. He exhibits no distension. There is tenderness in  the right upper quadrant and left upper quadrant. There is no guarding.  Musculoskeletal: Normal range of motion. He exhibits no edema or deformity.  Lymphadenopathy:    He has no cervical adenopathy.  Neurological: He is alert.  Cranial nerves grossly intact. Patient moves extremities symmetrically and with good coordination.  Skin: Skin is warm and dry. No rash noted. No erythema.  Psychiatric: He has a normal mood and affect. His behavior is normal. Judgment and thought content normal.  Nursing note and vitals reviewed.    ED Treatments / Results  Labs (all labs ordered are listed, but only abnormal results are displayed) Labs Reviewed  COMPREHENSIVE METABOLIC PANEL - Abnormal; Notable for the following components:      Result Value   Chloride 97 (*)    Glucose, Bld 287 (*)    BUN 60 (*)    Creatinine, Ser 6.05 (*)    Total Protein 8.4 (*)    Total Bilirubin 2.3 (*)    GFR calc non Af Amer 9 (*)    GFR calc Af Amer 11 (*)    Anion gap 16 (*)    All other components within normal limits  LIPASE, BLOOD  CBC  URINALYSIS, ROUTINE W REFLEX  MICROSCOPIC  TROPONIN I    EKG  EKG Interpretation  Date/Time:  Thursday February 01 2018 22:23:02 EST Ventricular Rate:  94 PR Interval:    QRS Duration: 88 QT Interval:  346 QTC Calculation: 433 R Axis:   60 Text Interpretation:  Sinus rhythm Prolonged PR interval Anteroseptal infarct, old Borderline repolarization abnormality Borderline ST elevation, lateral leads Baseline wander in lead(s) I III aVL V3 V5 V6 No significant change since last tracing Confirmed by Deno Etienne [phone]) on 02/01/2018 10:33:07 PM       Radiology No results found.  Procedures Procedures (including critical care time)  Medications Ordered in ED Medications  fentaNYL (SUBLIMAZE) injection 50 mcg (50 mcg Intravenous Given 02/01/18 2306)  ondansetron (ZOFRAN) injection 4 mg (4 mg Intravenous Given 02/01/18 2306)  sodium chloride 0.9 % bolus 1,000 mL (1,000 mLs Intravenous New Bag/Given 02/01/18 2310)     Initial Impression / Assessment and Plan / ED Course  I have reviewed the triage vital signs and the nursing notes.  Pertinent labs & imaging results that were available during my care of the patient were reviewed by me and considered in my medical decision making (see chart for details).     Patient is nontoxic-appearing, afebrile, and in no acute distress.  Bladder scan demonstrated approximately 30 cc in the bladder, therefore do not feel that this is an outlet obstruction causing acute renal failure.  Patient exhibiting acute renal failure as evidenced by CMP.  BUN 60 and creatinine 6.05.  No prior history of dialysis or renal failure.  Patient does have a history of bilateral renal cell carcinoma.  Unclear etiology of abdominal pain, at this time, differential diagnosis includes cholecystitis, colitis, gastritis, peptic ulcer disease.  Lipase is 38, therefore doubt pancreatitis.  Will obtain CT renal stone study.  Additionally, do have concerns for ischemic cardiac symptoms, therefore will evaluate  with troponin.  EKG, reviewed by me, demonstrates no evidence of changes from prior.  Analgesia and anti-emetics provided.  Will seek admission for patient for acute renal failure.  CT scan of abdomen and pelvis renal stone study pending.  Spoke with Dr. Tamala Julian of [facility] hospitalist's, who will admit the patient.   This is a shared visit with Dr. Deno Etienne.  Patient was independently evaluated by this attending physician. Attending physician consulted in evaluation and admission management.   Final Clinical Impressions(s) / ED Diagnoses   Final diagnoses:  Acute renal failure, unspecified acute renal failure type [facility])  Upper abdominal pain    ED Discharge Orders    None       Albesa Seen, PA-C 02/02/18 [place], [place], DO 02/02/18 1518

## 2018-02-01 NOTE — ED Notes (Signed)
Pt is hard stick, tried 2x asked Mortimer Fries, RN to come and look

## 2018-02-02 ENCOUNTER — Other Ambulatory Visit: Payer: Self-pay

## 2018-02-02 ENCOUNTER — Ambulatory Visit: Payer: Self-pay | Admitting: Medical

## 2018-02-02 DIAGNOSIS — Z85528 Personal history of other malignant neoplasm of kidney: Secondary | ICD-10-CM | POA: Diagnosis not present

## 2018-02-02 DIAGNOSIS — F39 Unspecified mood [affective] disorder: Secondary | ICD-10-CM | POA: Diagnosis present

## 2018-02-02 DIAGNOSIS — Z794 Long term (current) use of insulin: Secondary | ICD-10-CM | POA: Diagnosis not present

## 2018-02-02 DIAGNOSIS — Z87898 Personal history of other specified conditions: Secondary | ICD-10-CM

## 2018-02-02 DIAGNOSIS — E1165 Type 2 diabetes mellitus with hyperglycemia: Secondary | ICD-10-CM | POA: Diagnosis not present

## 2018-02-02 DIAGNOSIS — N179 Acute kidney failure, unspecified: Secondary | ICD-10-CM | POA: Diagnosis present

## 2018-02-02 DIAGNOSIS — I129 Hypertensive chronic kidney disease with stage 1 through stage 4 chronic kidney disease, or unspecified chronic kidney disease: Secondary | ICD-10-CM | POA: Diagnosis not present

## 2018-02-02 DIAGNOSIS — R112 Nausea with vomiting, unspecified: Secondary | ICD-10-CM

## 2018-02-02 DIAGNOSIS — Z23 Encounter for immunization: Secondary | ICD-10-CM | POA: Diagnosis not present

## 2018-02-02 DIAGNOSIS — N183 Chronic kidney disease, stage 3 (moderate): Secondary | ICD-10-CM | POA: Diagnosis not present

## 2018-02-02 DIAGNOSIS — Z79899 Other long term (current) drug therapy: Secondary | ICD-10-CM | POA: Diagnosis not present

## 2018-02-02 DIAGNOSIS — E088 Diabetes mellitus due to underlying condition with unspecified complications: Secondary | ICD-10-CM

## 2018-02-02 DIAGNOSIS — K3184 Gastroparesis: Secondary | ICD-10-CM | POA: Diagnosis not present

## 2018-02-02 DIAGNOSIS — K5730 Diverticulosis of large intestine without perforation or abscess without bleeding: Secondary | ICD-10-CM | POA: Diagnosis not present

## 2018-02-02 DIAGNOSIS — K828 Other specified diseases of gallbladder: Secondary | ICD-10-CM | POA: Diagnosis not present

## 2018-02-02 DIAGNOSIS — Z885 Allergy status to narcotic agent status: Secondary | ICD-10-CM | POA: Diagnosis not present

## 2018-02-02 DIAGNOSIS — G40909 Epilepsy, unspecified, not intractable, without status epilepticus: Secondary | ICD-10-CM | POA: Diagnosis not present

## 2018-02-02 DIAGNOSIS — E1122 Type 2 diabetes mellitus with diabetic chronic kidney disease: Secondary | ICD-10-CM | POA: Diagnosis not present

## 2018-02-02 DIAGNOSIS — R1032 Left lower quadrant pain: Secondary | ICD-10-CM | POA: Diagnosis not present

## 2018-02-02 DIAGNOSIS — R634 Abnormal weight loss: Secondary | ICD-10-CM | POA: Diagnosis not present

## 2018-02-02 DIAGNOSIS — E43 Unspecified severe protein-calorie malnutrition: Secondary | ICD-10-CM | POA: Diagnosis not present

## 2018-02-02 DIAGNOSIS — Z7982 Long term (current) use of aspirin: Secondary | ICD-10-CM | POA: Diagnosis not present

## 2018-02-02 DIAGNOSIS — Z6832 Body mass index (BMI) 32.0-32.9, adult: Secondary | ICD-10-CM | POA: Diagnosis not present

## 2018-02-02 DIAGNOSIS — I69359 Hemiplegia and hemiparesis following cerebral infarction affecting unspecified side: Secondary | ICD-10-CM | POA: Diagnosis not present

## 2018-02-02 DIAGNOSIS — R1010 Upper abdominal pain, unspecified: Secondary | ICD-10-CM | POA: Diagnosis not present

## 2018-02-02 DIAGNOSIS — R1011 Right upper quadrant pain: Secondary | ICD-10-CM | POA: Diagnosis not present

## 2018-02-02 DIAGNOSIS — E1143 Type 2 diabetes mellitus with diabetic autonomic (poly)neuropathy: Secondary | ICD-10-CM | POA: Diagnosis not present

## 2018-02-02 DIAGNOSIS — I10 Essential (primary) hypertension: Secondary | ICD-10-CM

## 2018-02-02 LAB — GLUCOSE, CAPILLARY
GLUCOSE-CAPILLARY: 233 mg/dL — AB (ref 65–99)
Glucose-Capillary: 194 mg/dL — ABNORMAL HIGH (ref 65–99)
Glucose-Capillary: 164 mg/dL — ABNORMAL HIGH (ref 65–99)
Glucose-Capillary: 157 mg/dL — ABNORMAL HIGH (ref 65–99)
Glucose-Capillary: 194 mg/dL — ABNORMAL HIGH (ref 65–99)

## 2018-02-02 LAB — HEMOGLOBIN A1C
Hgb A1c MFr Bld: 11.4 % — ABNORMAL HIGH (ref 4.8–5.6)
Mean Plasma Glucose: 280.48 mg/dL

## 2018-02-02 LAB — CBC
HCT: 37.9 % — ABNORMAL LOW (ref 39.0–52.0)
Hemoglobin: 13.2 g/dL (ref 13.0–17.0)
MCH: 30.1 pg (ref 26.0–34.0)
MCHC: 34.8 g/dL (ref 30.0–36.0)
MCV: 86.5 fL (ref 78.0–100.0)
PLATELETS: 216 10*3/uL (ref 150–400)
RBC: 4.38 MIL/uL (ref 4.22–5.81)
RDW: 11.9 % (ref 11.5–15.5)
WBC: 5.9 10*3/uL (ref 4.0–10.5)

## 2018-02-02 LAB — BASIC METABOLIC PANEL
Anion gap: 16 — ABNORMAL HIGH (ref 5–15)
BUN: 61 mg/dL — ABNORMAL HIGH (ref 6–20)
CALCIUM: 8.4 mg/dL — AB (ref 8.9–10.3)
CO2: 24 mmol/L (ref 22–32)
CREATININE: 5.14 mg/dL — AB (ref 0.61–1.24)
Chloride: 101 mmol/L (ref 101–111)
GFR calc Af Amer: 13 mL/min — ABNORMAL LOW (ref >60)
GFR, EST NON AFRICAN AMERICAN: 12 mL/min — AB (ref >60)
Glucose, Bld: 239 mg/dL — ABNORMAL HIGH (ref 65–99)
Potassium: 3.6 mmol/L (ref 3.5–5.1)
SODIUM: 141 mmol/L (ref 135–145)

## 2018-02-02 LAB — URINALYSIS, ROUTINE W REFLEX MICROSCOPIC
BILIRUBIN URINE: NEGATIVE
Bacteria, UA: NONE SEEN
GLUCOSE, UA: 50 mg/dL — AB
HGB URINE DIPSTICK: NEGATIVE
KETONES UR: NEGATIVE mg/dL
NITRITE: NEGATIVE
PH: 5 (ref 5.0–8.0)
Protein, ur: 100 mg/dL — AB
Specific Gravity, Urine: 1.018 (ref 1.005–1.030)

## 2018-02-02 LAB — CREATININE, URINE, RANDOM: CREATININE, URINE: 490.53 mg/dL

## 2018-02-02 LAB — SODIUM, URINE, RANDOM: SODIUM UR: 16 mmol/L

## 2018-02-02 LAB — SEDIMENTATION RATE: SED RATE: 28 mm/h — AB (ref 0–16)

## 2018-02-02 MED ORDER — HYDRALAZINE HCL 20 MG/ML IJ SOLN: 10.0000 mg | INTRAMUSCULAR | Status: DC | PRN

## 2018-02-02 MED ORDER — ACETAMINOPHEN 325 MG PO TABS
650.0000 mg | ORAL_TABLET | Freq: Four times a day (QID) | ORAL | Status: DC | PRN
  Administered 2018-02-02 – 2018-02-05 (×2): 650 mg via ORAL
  Filled 2018-02-02 (×2): qty 2

## 2018-02-02 MED ORDER — SODIUM CHLORIDE 0.9 % IV SOLN
INTRAVENOUS | Status: DC
  Administered 2018-02-02: 03:00:00 via INTRAVENOUS

## 2018-02-02 MED ORDER — HEPARIN SODIUM (PORCINE) 5000 UNIT/ML IJ SOLN
5000.0000 [IU] | Freq: Three times a day (TID) | INTRAMUSCULAR | Status: DC
  Administered 2018-02-02 – 2018-02-06 (×12): 5000 [IU] via SUBCUTANEOUS
  Filled 2018-02-02 (×12): qty 1

## 2018-02-02 MED ORDER — LEVETIRACETAM 500 MG PO TABS
1000.0000 mg | ORAL_TABLET | Freq: Every day | ORAL | Status: DC
  Administered 2018-02-02 – 2018-02-06 (×5): 1000 mg via ORAL
  Filled 2018-02-02 (×7): qty 2

## 2018-02-02 MED ORDER — ONDANSETRON HCL 4 MG PO TABS: 4.0000 mg | ORAL_TABLET | Freq: Four times a day (QID) | ORAL | Status: DC | PRN

## 2018-02-02 MED ORDER — HYDROCODONE-ACETAMINOPHEN 5-325 MG PO TABS
1.0000 | ORAL_TABLET | ORAL | Status: AC | PRN
  Administered 2018-02-02 – 2018-02-04 (×3): 1 via ORAL
  Filled 2018-02-02 (×3): qty 1

## 2018-02-02 MED ORDER — ORAL CARE MOUTH RINSE
15.0000 mL | Freq: Two times a day (BID) | OROMUCOSAL | Status: DC
  Administered 2018-02-02 – 2018-02-06 (×6): 15 mL via OROMUCOSAL

## 2018-02-02 MED ORDER — SODIUM CHLORIDE 0.9 % IV BOLUS (SEPSIS)
1000.0000 mL | Freq: Once | INTRAVENOUS | Status: AC
  Administered 2018-02-02: 1000 mL via INTRAVENOUS

## 2018-02-02 MED ORDER — INSULIN ASPART 100 UNIT/ML ~~LOC~~ SOLN
0.0000 [IU] | Freq: Three times a day (TID) | SUBCUTANEOUS | Status: DC
  Administered 2018-02-02 (×3): 3 [IU] via SUBCUTANEOUS
  Administered 2018-02-03: 5 [IU] via SUBCUTANEOUS
  Administered 2018-02-03 – 2018-02-04 (×2): 3 [IU] via SUBCUTANEOUS
  Administered 2018-02-04: 5 [IU] via SUBCUTANEOUS
  Administered 2018-02-06 (×2): 3 [IU] via SUBCUTANEOUS

## 2018-02-02 MED ORDER — ONDANSETRON HCL 4 MG/2ML IJ SOLN
4.0000 mg | Freq: Four times a day (QID) | INTRAMUSCULAR | Status: DC | PRN
  Administered 2018-02-03: 4 mg via INTRAVENOUS
  Filled 2018-02-02: qty 2

## 2018-02-02 MED ORDER — ENSURE ENLIVE PO LIQD
237.0000 mL | Freq: Two times a day (BID) | ORAL | Status: DC
  Administered 2018-02-02: 237 mL via ORAL

## 2018-02-02 MED ORDER — GLUCERNA SHAKE PO LIQD
237.0000 mL | Freq: Three times a day (TID) | ORAL | Status: DC
  Administered 2018-02-02 – 2018-02-06 (×10): 237 mL via ORAL
  Filled 2018-02-02 (×14): qty 237

## 2018-02-02 MED ORDER — INFLUENZA VAC SPLIT QUAD 0.5 ML IM SUSY
0.5000 mL | PREFILLED_SYRINGE | INTRAMUSCULAR | Status: AC
  Administered 2018-02-05: 0.5 mL via INTRAMUSCULAR
  Filled 2018-02-02: qty 0.5

## 2018-02-02 MED ORDER — INSULIN GLARGINE 100 UNIT/ML ~~LOC~~ SOLN
15.0000 [IU] | Freq: Two times a day (BID) | SUBCUTANEOUS | Status: DC
  Administered 2018-02-02 – 2018-02-06 (×7): 15 [IU] via SUBCUTANEOUS
  Filled 2018-02-02 (×10): qty 0.15

## 2018-02-02 MED ORDER — INSULIN GLARGINE 100 UNIT/ML ~~LOC~~ SOLN
15.0000 [IU] | Freq: Two times a day (BID) | SUBCUTANEOUS | Status: DC
  Filled 2018-02-02: qty 0.15

## 2018-02-02 MED ORDER — INSULIN GLARGINE 100 UNIT/ML ~~LOC~~ SOLN
45.0000 [IU] | Freq: Every day | SUBCUTANEOUS | Status: DC
  Administered 2018-02-02: 45 [IU] via SUBCUTANEOUS
  Filled 2018-02-02: qty 0.45

## 2018-02-02 MED ORDER — SODIUM CHLORIDE 0.9 % IV SOLN
INTRAVENOUS | Status: AC
  Administered 2018-02-02 – 2018-02-03 (×2): via INTRAVENOUS

## 2018-02-02 MED ORDER — INSULIN ASPART 100 UNIT/ML ~~LOC~~ SOLN
0.0000 [IU] | Freq: Every day | SUBCUTANEOUS | Status: DC
  Administered 2018-02-05: 2 [IU] via SUBCUTANEOUS

## 2018-02-02 MED ORDER — SODIUM CHLORIDE 0.9 % IV BOLUS (SEPSIS): 1000.0000 mL | Freq: Once | INTRAVENOUS | Status: DC

## 2018-02-02 MED ORDER — ATORVASTATIN CALCIUM 40 MG PO TABS
40.0000 mg | ORAL_TABLET | Freq: Every day | ORAL | Status: DC
  Administered 2018-02-02 – 2018-02-06 (×4): 40 mg via ORAL
  Filled 2018-02-02 (×5): qty 1

## 2018-02-02 MED ORDER — ACETAMINOPHEN 650 MG RE SUPP: 650.0000 mg | Freq: Four times a day (QID) | RECTAL | Status: DC | PRN

## 2018-02-02 MED ORDER — ALBUTEROL SULFATE (2.5 MG/3ML) 0.083% IN NEBU
2.5000 mg | INHALATION_SOLUTION | RESPIRATORY_TRACT | Status: DC | PRN
Start: 2018-02-02 — End: 2018-02-06

## 2018-02-02 NOTE — ED Notes (Signed)
ED TO INPATIENT HANDOFF REPORT  Name/Age/Gender Brandon [name] 57 y.o. male  Code Status    Code Status Orders  (From admission, onward)        Start     Ordered   02/02/18 0131  Full code  Continuous     02/02/18 0133    Code Status History    Date Active Date Inactive Code Status Order ID Comments User Context   05/10/2017 07:53 05/15/2017 18:39 Full Code [id number]  Robbie Lis, MD Inpatient   04/22/2016 00:00 04/24/2016 17:32 Full Code [id number]  Reubin Milan, MD Inpatient   03/01/2016 17:05 03/05/2016 16:08 Full Code [id number]  Niel Hummer, NP Inpatient   02/29/2016 17:02 03/01/2016 17:05 Full Code [id number]  [name], PA-C ED   12/13/2014 15:33 12/15/2014 12:20 Full Code [id number]  Ezequiel Essex, MD ED   11/16/2013 23:50 11/20/2013 20:19 Full Code [id number]  Etta Quill, DO ED   12/30/2011 17:02 01/01/2012 18:26 Full Code [id number]  Dark, Laurel Dimmer, RN Inpatient      Home/SNF/Other Home  Chief Complaint emesis / trouble urinating   Level of Care/Admitting Diagnosis ED Disposition    ED Disposition Condition [place]: [facility] [[id number]]  Level of Care: Med-Surg [16]  Diagnosis: ARF (acute renal failure) [facility]) [[id number]]  Admitting Physician: Norval Morton [[id number]]  Attending Physician: Norval Morton [[id number]]  PT Class (Do Not Modify): Observation [104]  PT Acc Code (Do Not Modify): Observation [10022]       Medical History Past Medical History:  Diagnosis Date  . Acute ischemic stroke ([place]) 11/2013  . Allergy   . Anxiety   . Arthritis   . 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 Depression    BH Adm in [place] Depression  . Diabetes mellitus    DKA prior hospitalization  . Diverticulitis    s/p micorperforation Sept 2012-managed conservatively by Gen surgery  . ED (erectile dysfunction)   . Focal seizure ([place]) 11/2013   due to ischemic stroke  .  GERD (gastroesophageal reflux disease)   . Hiatal hernia   . Hyperlipidemia   . Hypertension   . Kidney tumor 09/2011   Renal cell CA  . Seizures ([place])    none since 2016, taking Keppra - maw  . Sleep apnea   . Thyroid disease    "weak thyroid" per MD  . Wears glasses     Allergies Allergies  Allergen Reactions  . Morphine Itching    IV Location/Drains/Wounds Patient Lines/Drains/Airways Status   Active Line/Drains/Airways    Name:   Placement date:   Placement time:   Site:   Days:   Peripheral IV 02/01/18 Left Antecubital   02/01/18    2250    Antecubital   1   Wound / Incision (Open or Dehisced) 04/22/16 Diabetic ulcer Foot Lower;Left bottom of L foot   04/22/16    0145    Foot   651          Labs/Imaging Results for orders placed or performed during the hospital encounter of 02/01/18 (from the past 48 hour(s))  Lipase, blood     Status: None   Collection Time: 02/01/18  3:24 PM  Result Value Ref Range   Lipase 38 11 - 51 U/L    Comment: Performed at [facility], [place] [address] [place], [place] [address]  Comprehensive  metabolic panel     Status: Abnormal   Collection Time: 02/01/18  3:24 PM  Result Value Ref Range   Sodium 140 135 - 145 mmol/L   Potassium 4.5 3.5 - 5.1 mmol/L   Chloride 97 (L) 101 - 111 mmol/L   CO2 27 22 - 32 mmol/L   Glucose, Bld 287 (H) 65 - 99 mg/dL   BUN 60 (H) 6 - 20 mg/dL   Creatinine, Ser 6.05 (H) 0.61 - 1.24 mg/dL   Calcium 9.3 8.9 - 10.3 mg/dL   Total Protein 8.4 (H) 6.5 - 8.1 g/dL   Albumin 4.6 3.5 - 5.0 g/dL   AST 17 15 - 41 U/L   ALT 19 17 - 63 U/L   Alkaline Phosphatase 123 38 - 126 U/L   Total Bilirubin 2.3 (H) 0.3 - 1.2 mg/dL   GFR calc non Af Amer 9 (L) >60 mL/min   GFR calc Af Amer 11 (L) >60 mL/min    Comment: (NOTE) The eGFR has been calculated using the CKD EPI equation. This calculation has not been validated in all clinical situations. eGFR's persistently <60 mL/min signify possible Chronic  Kidney Disease.    Anion gap 16 (H) 5 - 15    Comment: Performed at [facility], [place] [address] [place], [place] [address]  CBC     Status: None   Collection Time: 02/01/18  3:24 PM  Result Value Ref Range   WBC 6.1 4.0 - 10.5 K/uL   RBC 4.70 4.22 - 5.81 MIL/uL   Hemoglobin 14.4 13.0 - 17.0 g/dL   HCT 40.4 39.0 - 52.0 %   MCV 86.0 78.0 - 100.0 fL   MCH 30.6 26.0 - 34.0 pg   MCHC 35.6 30.0 - 36.0 g/dL   RDW 11.8 11.5 - 15.5 %   Platelets 275 150 - 400 K/uL    Comment: Performed at [facility], [place] [address], [place], [place] [address]  Troponin I     Status: None   Collection Time: 02/01/18 10:59 PM  Result Value Ref Range   Troponin I <0.03 <0.03 ng/mL    Comment: Performed at [facility], [place] [address] [place], [place] [address]  Urinalysis, Routine w reflex microscopic     Status: Abnormal   Collection Time: 02/02/18 12:40 AM  Result Value Ref Range   Color, Urine YELLOW YELLOW   APPearance CLEAR CLEAR   Specific Gravity, Urine 1.018 1.005 - 1.030   pH 5.0 5.0 - 8.0   Glucose, UA 50 (A) NEGATIVE mg/dL   Hgb urine dipstick NEGATIVE NEGATIVE   Bilirubin Urine NEGATIVE NEGATIVE   Ketones, ur NEGATIVE NEGATIVE mg/dL   Protein, ur 100 (A) NEGATIVE mg/dL   Nitrite NEGATIVE NEGATIVE   Leukocytes, UA TRACE (A) NEGATIVE   RBC / HPF 0-5 0 - 5 RBC/hpf   WBC, UA 0-5 0 - 5 WBC/hpf   Bacteria, UA NONE SEEN NONE SEEN   Squamous Epithelial / LPF 0-5 (A) NONE SEEN   Mucus PRESENT    Hyaline Casts, UA PRESENT     Comment: Performed at [facility], [place] [address] [place],  [address]   Dg Chest 2 View  Result Date: 02/01/2018 CLINICAL DATA:  Cough EXAM: CHEST  2 VIEW COMPARISON:  10/17/2016 FINDINGS: Heart and mediastinal contours are within normal limits. No focal opacities or effusions. No acute bony abnormality. IMPRESSION: No active cardiopulmonary disease. Electronically Signed   By: Lennette Bihari  [place] M.D.   On: 02/01/2018 23:39   Ct Renal Stone Study  Result Date: 02/01/2018 CLINICAL DATA:  Abdominal pain with nausea vomiting and decreased urine output history of renal cell cancer EXAM: CT ABDOMEN AND PELVIS WITHOUT CONTRAST TECHNIQUE: Multidetector CT imaging of the abdomen and pelvis was performed following the standard protocol without IV contrast. COMPARISON:  05/10/2017, 04/04/2017, 01/01/2016, 12/13/2014 FINDINGS: Lower chest: Lung bases demonstrate no acute consolidation or pleural effusion. Normal heart size. Hepatobiliary: No focal liver abnormality is seen. No gallstones, gallbladder wall thickening, or biliary dilatation. Pancreas: Unremarkable. No pancreatic ductal dilatation or surrounding inflammatory changes. Spleen: Normal in size without focal abnormality. Adrenals/Urinary Tract: Adrenal glands are within normal limits. Bilateral cryoablation defects are unchanged. Right sub capsular renal lesion difficult to appreciate without contrast. No hydronephrosis. No ureteral stone. Slightly thick-walled bladder but underdistended. Stomach/Bowel: Stomach is within normal limits. Appendix appears normal. No evidence of bowel wall thickening, distention, or inflammatory changes. Sigmoid colon diverticular disease without acute inflammation Vascular/Lymphatic: Nonaneurysmal aorta. Scattered atherosclerosis. No significantly enlarged lymph nodes Reproductive: Prostate is unremarkable. Other: Tiny fat in the inguinal regions. Negative for free air or free fluid. Small fat in the umbilicus Musculoskeletal: Diffuse degenerative changes. Mild retrolisthesis of L5 on S1. No acute or suspicious finding. IMPRESSION: 1. Negative for hydronephrosis, nephrolithiasis or ureteral stone 2. Stable appearance of bilateral cryoablation defects allowing for absence of contrast. Known small subcapsular lesion on the right is difficult to see without contrast. 3. Sigmoid colon diverticular disease without acute  inflammation Electronically Signed   By: Donavan Foil M.D.   On: 02/01/2018 23:39    Pending Labs Unresulted Labs (From admission, onward)   Start     Ordered   02/02/18 0500  CBC  Tomorrow morning,   R     02/02/18 0133   02/02/18 4665  Basic metabolic panel  Tomorrow morning,   R     02/02/18 0133   02/02/18 0130  Creatinine, urine, random  Once,   R     02/02/18 0133   02/02/18 0130  Sodium, urine, random  Once,   R     02/02/18 0133      Vitals/Pain Today's Vitals   02/02/18 0011 02/02/18 0011 02/02/18 0015 02/02/18 0128  BP:  132/90 (!) 145/93 134/86  Pulse:  92 89 90  Resp:  11 (!) 9 16  Temp:      TempSrc:      SpO2:  98% 100% 100%  PainSc: 3        Isolation Precautions No active isolations  Medications Medications  atorvastatin (LIPITOR) tablet 40 mg (not administered)  Insulin Glargine (LANTUS) Solostar Pen 45 Units (not administered)  levETIRAcetam (KEPPRA) tablet 1,000 mg (not administered)  heparin injection 5,000 Units (not administered)  ondansetron (ZOFRAN) tablet 4 mg (not administered)    Or  ondansetron (ZOFRAN) injection 4 mg (not administered)  acetaminophen (TYLENOL) tablet 650 mg (not administered)    Or  acetaminophen (TYLENOL) suppository 650 mg (not administered)  0.9 %  sodium chloride infusion (not administered)  albuterol (PROVENTIL) (2.5 MG/3ML) 0.083% nebulizer solution 2.5 mg (not administered)  fentaNYL (SUBLIMAZE) injection 50 mcg (50 mcg Intravenous Given 02/01/18 2306)  ondansetron (ZOFRAN) injection 4 mg (4 mg Intravenous Given 02/01/18 2306)  sodium chloride 0.9 % bolus 1,000 mL (0 mLs Intravenous Stopped 02/02/18 0013)  sodium chloride 0.9 % bolus 1,000 mL (1,000 mLs Intravenous New Bag/Given 02/02/18 0126)    Mobility walks

## 2018-02-02 NOTE — Care Management Obs Status (Signed)
[place] NOTIFICATION   Patient Details  Name: Brandon [name] MRN: [id number] Date of Birth: 1961/02/26   Medicare Observation Status Notification Given:  Yes    MahabirJuliann Pulse, RN 02/02/2018, 3:01 PM

## 2018-02-02 NOTE — H&P (Signed)
History and Physical    Brandon [name] UDJ:[id number] DOB: 1961-04-02 DOA: 02/01/2018  Referring MD/NP/PA: Langston Masker PA-C PCP: Carlena Hurl, PA-C  Patient coming from: Home  Chief Complaint: Weakness  I have personally briefly reviewed patient's old medical records in [place]   HPI: Brandon [name] is a 57 y.o. male with medical history significant of DM type II, CVA, B/l renal cancer status post cryoablation in 2012, diverticulitis, seizures, and mood disorder; who presents with complaints of a several days of nausea, vomiting, and epigastric abdominal pain.  Initially, patient felt that he had gotten food poisoning or had some other viral illness.  Reported associated symptoms of chills, subjective fever, some shortness of breath, productive cough, decreased urine output, and estimated weight loss of 20-30 pounds in the last 2 weeks.  Tried keeping himself hydrated but anything that he ate or drank he vomited up.  Emesis was noted to be nonbloody and he denies having any diarrhea.  He also complained of some chest discomfort but he is unsure if symptoms came from the vomiting and dry heaves.  ED Course: Upon admission to the emergency department patient was noted to be afebrile, pulse 71-107, respirations 9-18, blood pressure 121/94-140 5/93, and O2 saturations maintained.  Labs revealed normal CBC, CO2 27, BUN 60, creatinine 6.05, glucose 287, and anion gap 16.  Urinalysis did not show any significant signs of infection or give concern for rhabdomyolysis.  Chest x-ray showed no acute abnormality. CT  abdomen pelvis was negative for any acute abnormality or obstruction.  Review of Systems  Constitutional: Positive for chills, fever, malaise/fatigue and weight loss.  HENT: Negative for ear discharge and ear pain.   Eyes: Negative for pain and discharge.  Respiratory: Positive for cough and shortness of breath. Negative for wheezing.   Cardiovascular: Positive for chest  pain. Negative for leg swelling.  Gastrointestinal: Positive for abdominal pain, nausea and vomiting.  Genitourinary: Negative for dysuria and hematuria.       Positive for decreased urine output  Musculoskeletal: Negative for falls.  Skin: Negative for itching.  Neurological: Positive for dizziness and weakness. Negative for focal weakness.  Psychiatric/Behavioral: Negative for memory loss and substance abuse.    Past Medical History:  Diagnosis Date  . Acute ischemic stroke ([place]) 11/2013  . Allergy   . Anxiety   . Arthritis   . 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 Depression    BH Adm in [place] Depression  . Diabetes mellitus    DKA prior hospitalization  . Diverticulitis    s/p micorperforation Sept 2012-managed conservatively by Gen surgery  . ED (erectile dysfunction)   . Focal seizure ([place]) 11/2013   due to ischemic stroke  . GERD (gastroesophageal reflux disease)   . Hiatal hernia   . Hyperlipidemia   . Hypertension   . Kidney tumor 09/2011   Renal cell CA  . Seizures ([place])    none since 2016, taking Keppra - maw  . Sleep apnea   . Thyroid disease    "weak thyroid" per MD  . Wears glasses     Past Surgical History:  Procedure Laterality Date  . COLONOSCOPY    . IR RADIOLOGIST EVAL & MGMT  04/04/2017  . KIDNEY SURGERY     ablation of renal cell CA - 12/28, prior one was October 2012-Dr. Kathlene Cote  . TEE WITHOUT CARDIOVERSION N/A 11/19/2013   Procedure: TRANSESOPHAGEAL ECHOCARDIOGRAM (TEE);  Surgeon: Lelon Perla, MD;  [facility] ENDOSCOPY;  Service: Cardiovascular;  Laterality: N/A;     reports that  has never smoked. he has never used smokeless tobacco. He reports that he drinks alcohol. He reports that he does not use drugs.  Allergies  Allergen Reactions  . Morphine Itching    Family History  Problem Relation Age of Onset  . Diabetes Father   . Prostate cancer Other   . Stomach cancer Other   . Colon  cancer Neg Hx   . Esophageal cancer Neg Hx   . Rectal cancer Neg Hx     Prior to Admission medications   Medication Sig Start Date End Date Taking? Authorizing Provider  aspirin 325 MG tablet Take 325 mg by mouth daily.    Yes [provider]  atorvastatin (LIPITOR) 40 MG tablet Take 1 tablet (40 mg total) by mouth daily. 11/04/16  Yes Tysinger, Camelia Eng, PA-C  LANTUS SOLOSTAR 100 UNIT/ML Solostar Pen INJECT 45 UNITS INTO THE SKIN DAILY AT 10 PM 08/24/17  Yes Tysinger, Camelia Eng, PA-C  levETIRAcetam (KEPPRA) 500 MG tablet 1/2 tablet po BID Patient taking differently: Take 1,000 mg by mouth daily.  11/29/17  Yes Dennie Bible, NP  lisinopril (PRINIVIL,ZESTRIL) 10 MG tablet TAKE 1 TABLET BY MOUTH EVERY DAY 11/20/17  Yes Tysinger, Camelia Eng, PA-C  Semaglutide (OZEMPIC) 0.25 or 0.5 MG/DOSE SOPN Inject 0.25 mg once a week into the skin. 10/10/17  Yes Tysinger, Camelia Eng, PA-C  blood glucose meter kit and supplies Dispense based on patient and insurance preference. Use up to four times daily as directed. (FOR ICD-9 250.00, 250.01). 04/24/16   Eugenie Filler, MD  insulin glargine (LANTUS) 100 UNIT/ML injection Inject 0.5 mLs (50 Units total) daily into the skin. Patient not taking: Reported on 02/01/2018 10/10/17   Tysinger, Camelia Eng, PA-C  Insulin Pen Needle (PEN NEEDLES) 32G X 4 MM MISC 1 each by Does not apply route daily. Use for insulin pens 05/24/16   Tysinger, Camelia Eng, PA-C    Physical Exam:  Constitutional: Older male who appears to be in no acute distress at this time Vitals:   02/01/18 2146 02/01/18 2245 02/02/18 0011 02/02/18 0015  BP: (!) 135/115 (!) 121/94 132/90 (!) 145/93  Pulse: 71  92 89  Resp: '18 14 11 ' (!) 9  Temp:      TempSrc:      SpO2: 99%  98% 100%   Eyes: PERRL, lids and conjunctivae normal ENMT: Mucous membranes are moist. Posterior pharynx clear of any exudate or lesions.Normal dentition.  Neck: normal, supple, no masses, no thyromegaly Respiratory: clear  to auscultation bilaterally, no wheezing, no crackles. Normal respiratory effort. No accessory muscle use.  Cardiovascular: Regular rate and rhythm, no murmurs / rubs / gallops. No extremity edema. 2+ pedal pulses. No carotid bruits.  Abdomen: Mild epigastric tenderness to palpation, no masses palpated. No hepatosplenomegaly. Bowel sounds positive.  Musculoskeletal: no clubbing / cyanosis. No joint deformity upper and lower extremities. Good ROM, no contractures. Normal muscle tone.  Skin: no rashes, lesions, ulcers. No induration Neurologic: CN 2-12 grossly intact. Sensation intact, DTR normal. Strength 5/5 in all 4.  Psychiatric: Normal judgment and insight. Alert and oriented x 3. Normal mood.     Labs on Admission: I have personally reviewed following labs and imaging studies  CBC: Recent Labs  Lab 02/01/18 1524  WBC 6.1  HGB 14.4  HCT 40.4  MCV 86.0  PLT 275  Basic Metabolic Panel: Recent Labs  Lab 02/01/18 1524  NA 140  K 4.5  CL 97*  CO2 27  GLUCOSE 287*  BUN 60*  CREATININE 6.05*  CALCIUM 9.3   GFR: CrCl cannot be calculated (Unknown ideal weight.). Liver Function Tests: Recent Labs  Lab 02/01/18 1524  AST 17  ALT 19  ALKPHOS 123  BILITOT 2.3*  PROT 8.4*  ALBUMIN 4.6   Recent Labs  Lab 02/01/18 1524  LIPASE 38   No results for input(s): AMMONIA in the last 168 hours. Coagulation Profile: No results for input(s): INR, PROTIME in the last 168 hours. Cardiac Enzymes: Recent Labs  Lab 02/01/18 2259  TROPONINI <0.03   BNP (last 3 results) No results for input(s): PROBNP in the last 8760 hours. HbA1C: No results for input(s): HGBA1C in the last 72 hours. CBG: No results for input(s): GLUCAP in the last 168 hours. Lipid Profile: No results for input(s): CHOL, HDL, LDLCALC, TRIG, CHOLHDL, LDLDIRECT in the last 72 hours. Thyroid Function Tests: No results for input(s): TSH, T4TOTAL, FREET4, T3FREE, THYROIDAB in the last 72 hours. Anemia  Panel: No results for input(s): VITAMINB12, FOLATE, FERRITIN, TIBC, IRON, RETICCTPCT in the last 72 hours. Urine analysis:    Component Value Date/Time   COLORURINE YELLOW 05/10/2017 0022   APPEARANCEUR CLEAR 05/10/2017 0022   LABSPEC 1.030 05/23/2017 1538   PHURINE 5.0 05/10/2017 0022   GLUCOSEU 150 (A) 05/10/2017 0022   HGBUR SMALL (A) 05/10/2017 0022   BILIRUBINUR negative 05/23/2017 1538   BILIRUBINUR neg 11/04/2016 1619   KETONESUR negative 05/23/2017 1538   KETONESUR NEGATIVE 05/10/2017 0022   PROTEINUR trace (A) 05/23/2017 1538   PROTEINUR NEGATIVE 05/10/2017 0022   UROBILINOGEN negative 11/04/2016 1619   UROBILINOGEN 1.0 12/13/2014 1029   NITRITE Negative 05/23/2017 1538   NITRITE NEGATIVE 05/10/2017 0022   LEUKOCYTESUR Small (1+) (A) 05/23/2017 1538   Sepsis Labs: No results found for this or any previous visit (from the past 240 hour(s)).   Radiological Exams on Admission: Dg Chest 2 View  Result Date: 02/01/2018 CLINICAL DATA:  Cough EXAM: CHEST  2 VIEW COMPARISON:  10/17/2016 FINDINGS: Heart and mediastinal contours are within normal limits. No focal opacities or effusions. No acute bony abnormality. IMPRESSION: No active cardiopulmonary disease. Electronically Signed   By: Rolm Baptise M.D.   On: 02/01/2018 23:39   Ct Renal Stone Study  Result Date: 02/01/2018 CLINICAL DATA:  Abdominal pain with nausea vomiting and decreased urine output history of renal cell cancer EXAM: CT ABDOMEN AND PELVIS WITHOUT CONTRAST TECHNIQUE: Multidetector CT imaging of the abdomen and pelvis was performed following the standard protocol without IV contrast. COMPARISON:  05/10/2017, 04/04/2017, 01/01/2016, 12/13/2014 FINDINGS: Lower chest: Lung bases demonstrate no acute consolidation or pleural effusion. Normal heart size. Hepatobiliary: No focal liver abnormality is seen. No gallstones, gallbladder wall thickening, or biliary dilatation. Pancreas: Unremarkable. No pancreatic ductal  dilatation or surrounding inflammatory changes. Spleen: Normal in size without focal abnormality. Adrenals/Urinary Tract: Adrenal glands are within normal limits. Bilateral cryoablation defects are unchanged. Right sub capsular renal lesion difficult to appreciate without contrast. No hydronephrosis. No ureteral stone. Slightly thick-walled bladder but underdistended. Stomach/Bowel: Stomach is within normal limits. Appendix appears normal. No evidence of bowel wall thickening, distention, or inflammatory changes. Sigmoid colon diverticular disease without acute inflammation Vascular/Lymphatic: Nonaneurysmal aorta. Scattered atherosclerosis. No significantly enlarged lymph nodes Reproductive: Prostate is unremarkable. Other: Tiny fat in the inguinal regions. Negative for free air or free fluid. Small fat in the  umbilicus Musculoskeletal: Diffuse degenerative changes. Mild retrolisthesis of L5 on S1. No acute or suspicious finding. IMPRESSION: 1. Negative for hydronephrosis, nephrolithiasis or ureteral stone 2. Stable appearance of bilateral cryoablation defects allowing for absence of contrast. Known small subcapsular lesion on the right is difficult to see without contrast. 3. Sigmoid colon diverticular disease without acute inflammation Electronically Signed   By: Donavan Foil M.D.   On: 02/01/2018 23:39    EKG: Independently reviewed.  Sinus rhythm at 94 bpm  Assessment/Plan Acute renal failure on chronic kidney disease stage III: Patient's baseline creatinine previously had been noted to be 1.23 on 05/2017, but presents with a creatinine of 6.05 and BUN 16.  Elevated BUN to creatinine ratio and history of nausea or vomiting gives concern for prerenal cause of symptoms. - Admit to a MedSurg bed - Strict intake and output - Check urine creatinine and urine sodium - Bolus additional 1 L of normal saline IV fluids, then placed in a rate of 125 mL/h - Hold nephrotoxic agents  Nausea and vomiting:  Acute. - Continue supportive care with IV fluids and antiemetics - May warrant gastric emptying study  Diabetes mellitus type 2: Patient presents with elevated glucose of 287 an with elevated anion gap of 16 no signs of acidosis, and no ketones on urinalysis.  Last known hemoglobin A1c noted to be 11.1 on 10/10/2017.  Patient is on semaglutide. - Hypoglycemic protocol  - Check hemoglobin A1c - Continue home Lantus dose - CBGs q. before meals and at bedtime with moderate sliding scale insulin - Adjust insulin regimen as needed  Essential hypertension: Stable - hold lisinopril due to kidney injury - Hydralazine prn elevated blood pressure  Elevated total bilirubin - Recheck in a.m.  Weight loss: Acute.  Last 2 weeks patient reports losing 20-30 pounds.  Question possibility of uncontrolled diabetes versus malignancy.   - Check ESR  History of renal cell carcinoma status post cryoablation  History of stroke with residual weakness: Stable  Mood disorder/seizure disorder - Continue Keppra  DVT prophylaxis: heparin Code Status: Full Family Communication: No family present at bedside Disposition Plan: Likely discharge home once medically stable Consults called: none  Admission status: Inpatient  Norval Morton MD [facility] Pager [phone]   If 7PM-7AM, please contact night-coverage [email or web address] Password [email or web address]  02/02/2018, 1:17 AM

## 2018-02-02 NOTE — Progress Notes (Signed)
[facility] PROGRESS NOTE    Progress Note  SABIAN KUBA  ZOX:[id number] DOB: 1961/08/22 DOA: 02/01/2018 PCP: Carlena Hurl, PA-C     Brief Narrative:   Brandon [name] is an 57 y.o. male past medical history of diabetes mellitus type 2, bilateral renal cell cancer status post cryoablation in 2012 diverticulitis who presented to the ED with several days of nausea vomiting and epigastric pain, with associated chills fever and a productive cough, he relates he lost about 20 pounds in the last 2 weeks  Assessment/Plan:   Acute renal failure superimposed on stage 3 chronic kidney disease ([place]): Baseline creatinine 1.2 in 2018, on admission 6.0, urinary sodium and urinary creatinine are pending, likely prerenal in etiology in the setting of ACE inhibitor use and aspirin 325. We will give another liter of normal saline, continue normal saline infusion at 125 for 24 hours. Continue strict I's and O's Daily weight check a basic metabolic panel in the morning. Creatinine has started to trend down with repeated labs.  Nausea and vomiting: Continue supportive care.  Diabetes mellitus type 2: A1c is pending we will start him on long-acting at a lower dose and will split 50 percent in the morning and 50% in the evening, insulin plus sliding scale.  History of seizure None, continue Keppra  Essential hypertension Stable hold lisinopril, blood pressure in the 100s.  Elevated total bilirubin: Negative due to nausea and vomiting abdominal exam is benign, they are minimally elevated.  DVT prophylaxis: lovenox Family Communication:none Disposition Plan/Barrier to D/C: home in 1-2 day Code Status:     Code Status Orders  (From admission, onward)        Start     Ordered   02/02/18 0131  Full code  Continuous     02/02/18 0133    Code Status History    Date Active Date Inactive Code Status Order ID Comments User Context   05/10/2017 07:53 05/15/2017 18:39 Full Code  [id number]  Robbie Lis, MD Inpatient   04/22/2016 00:00 04/24/2016 17:32 Full Code [id number]  Reubin Milan, MD Inpatient   03/01/2016 17:05 03/05/2016 16:08 Full Code [id number]  Niel Hummer, NP Inpatient   02/29/2016 17:02 03/01/2016 17:05 Full Code [id number]  [name], PA-C ED   12/13/2014 15:33 12/15/2014 12:20 Full Code [id number]  Ezequiel Essex, MD ED   11/16/2013 23:50 11/20/2013 20:19 Full Code [id number]  Etta Quill, DO ED   12/30/2011 17:02 01/01/2012 18:26 Full Code [id number]  Dark, Laurel Dimmer, RN Inpatient        IV Access:    Peripheral IV   Procedures and diagnostic studies:   Dg Chest 2 View  Result Date: 02/01/2018 CLINICAL DATA:  Cough EXAM: CHEST  2 VIEW COMPARISON:  10/17/2016 FINDINGS: Heart and mediastinal contours are within normal limits. No focal opacities or effusions. No acute bony abnormality. IMPRESSION: No active cardiopulmonary disease. Electronically Signed   By: Rolm Baptise M.D.   On: 02/01/2018 23:39   Ct Renal Stone Study  Result Date: 02/01/2018 CLINICAL DATA:  Abdominal pain with nausea vomiting and decreased urine output history of renal cell cancer EXAM: CT ABDOMEN AND PELVIS WITHOUT CONTRAST TECHNIQUE: Multidetector CT imaging of the abdomen and pelvis was performed following the standard protocol without IV contrast. COMPARISON:  05/10/2017, 04/04/2017, 01/01/2016, 12/13/2014 FINDINGS: Lower chest: Lung bases demonstrate no acute consolidation or pleural effusion. Normal heart size. Hepatobiliary: No focal liver abnormality is seen. No gallstones, gallbladder wall thickening, or biliary  dilatation. Pancreas: Unremarkable. No pancreatic ductal dilatation or surrounding inflammatory changes. Spleen: Normal in size without focal abnormality. Adrenals/Urinary Tract: Adrenal glands are within normal limits. Bilateral cryoablation defects are unchanged. Right sub capsular renal lesion difficult to appreciate without contrast. No hydronephrosis.  No ureteral stone. Slightly thick-walled bladder but underdistended. Stomach/Bowel: Stomach is within normal limits. Appendix appears normal. No evidence of bowel wall thickening, distention, or inflammatory changes. Sigmoid colon diverticular disease without acute inflammation Vascular/Lymphatic: Nonaneurysmal aorta. Scattered atherosclerosis. No significantly enlarged lymph nodes Reproductive: Prostate is unremarkable. Other: Tiny fat in the inguinal regions. Negative for free air or free fluid. Small fat in the umbilicus Musculoskeletal: Diffuse degenerative changes. Mild retrolisthesis of L5 on S1. No acute or suspicious finding. IMPRESSION: 1. Negative for hydronephrosis, nephrolithiasis or ureteral stone 2. Stable appearance of bilateral cryoablation defects allowing for absence of contrast. Known small subcapsular lesion on the right is difficult to see without contrast. 3. Sigmoid colon diverticular disease without acute inflammation Electronically Signed   By: Donavan Foil M.D.   On: 02/01/2018 23:39     Medical Consultants:    None.  Anti-Infectives:   None  Subjective:    Teodoro Kil he relates he feels much better would like to try a clear liquid diet.  Objective:    Vitals:   02/02/18 0100 02/02/18 0128 02/02/18 0250 02/02/18 0620  BP: 134/86 134/86 131/88 92/62  Pulse:  90 88 83  Resp: 19 16  16   Temp:   97.7 F (36.5 C) 97.8 F (36.6 C)  TempSrc:   Oral Oral  SpO2:  100% 100% 100%  Weight:   110.3 kg (243 lb 2.7 oz)   Height:   6\' 1"  (1.854 m)     Intake/Output Summary (Last 24 hours) at 02/02/2018 0828 Last data filed at 02/02/2018 0542 Gross per 24 hour  Intake 1039.58 ml  Output 200 ml  Net 839.58 ml   Filed Weights   02/02/18 0250  Weight: 110.3 kg (243 lb 2.7 oz)    Exam: General exam: In no acute distress. Respiratory system: Good air movement and clear to auscultation. Cardiovascular system: S1 & S2 heard, RRR.  Gastrointestinal system:  Abdomen soft nontender nondistended. Central nervous system: Alert and oriented. No focal neurological deficits. Extremities: No pedal edema. Skin: No rashes, lesions or ulcers Psychiatry: Judgement and insight appear normal. Mood & affect appropriate.    Data Reviewed:    Labs: Basic Metabolic Panel: Recent Labs  Lab 02/01/18 1524 02/02/18 0347  NA 140 141  K 4.5 3.6  CL 97* 101  CO2 27 24  GLUCOSE 287* 239*  BUN 60* 61*  CREATININE 6.05* 5.14*  CALCIUM 9.3 8.4*   GFR Estimated Creatinine Clearance: 21.4 mL/min (A) (by C-G formula based on SCr of 5.14 mg/dL (H)). Liver Function Tests: Recent Labs  Lab 02/01/18 1524  AST 17  ALT 19  ALKPHOS 123  BILITOT 2.3*  PROT 8.4*  ALBUMIN 4.6   Recent Labs  Lab 02/01/18 1524  LIPASE 38   No results for input(s): AMMONIA in the last 168 hours. Coagulation profile No results for input(s): INR, PROTIME in the last 168 hours.  CBC: Recent Labs  Lab 02/01/18 1524 02/02/18 0347  WBC 6.1 5.9  HGB 14.4 13.2  HCT 40.4 37.9*  MCV 86.0 86.5  PLT 275 216   Cardiac Enzymes: Recent Labs  Lab 02/01/18 2259  TROPONINI <0.03   BNP (last 3 results) No results for input(s): PROBNP in the  last 8760 hours. CBG: Recent Labs  Lab 02/02/18 0302  GLUCAP 233*   D-Dimer: No results for input(s): DDIMER in the last 72 hours. Hgb A1c: Recent Labs    02/02/18 0347  HGBA1C 11.4*   Lipid Profile: No results for input(s): CHOL, HDL, LDLCALC, TRIG, CHOLHDL, LDLDIRECT in the last 72 hours. Thyroid function studies: No results for input(s): TSH, T4TOTAL, T3FREE, THYROIDAB in the last 72 hours.  Invalid input(s): FREET3 Anemia work up: No results for input(s): VITAMINB12, FOLATE, FERRITIN, TIBC, IRON, RETICCTPCT in the last 72 hours. Sepsis Labs: Recent Labs  Lab 02/01/18 1524 02/02/18 0347  WBC 6.1 5.9   Microbiology No results found for this or any previous visit (from the past 240 hour(s)).   Medications:   .  atorvastatin  40 mg Oral Daily  . feeding supplement (ENSURE ENLIVE)  237 mL Oral BID BM  . heparin  5,000 Units Subcutaneous Q8H  . [START ON 02/03/2018] Influenza vac split quadrivalent PF  0.5 mL Intramuscular Tomorrow-1000  . insulin aspart  0-15 Units Subcutaneous TID WC  . insulin aspart  0-5 Units Subcutaneous QHS  . insulin glargine  45 Units Subcutaneous Q2200  . levETIRAcetam  1,000 mg Oral Daily  . mouth rinse  15 mL Mouth Rinse BID   Continuous Infusions: . sodium chloride 125 mL/hr at 02/02/18 0241  . sodium chloride        LOS: 0 days   Charlynne Cousins  [facility] Pager [phone]  *Please refer to [email or web address], password [email or web address] to get updated schedule on who will round on this patient, as hospitalists switch teams weekly. If 7PM-7AM, please contact night-coverage at [email or web address], password [email or web address] for any overnight needs.  02/02/2018, 8:28 AM

## 2018-02-02 NOTE — Progress Notes (Signed)
Initial Nutrition Assessment  DOCUMENTATION CODES:   Severe malnutrition in context of acute illness/injury, Obesity unspecified  INTERVENTION:   - Ordered full liquid meal for pt per pt's request (apple juice and hot chocolate) - Glucerna Shake po TID, each supplement provides 220 kcal and 10 grams of protein - D/C Ensure Enlive due to high CBG's - Encourage PO intake  NUTRITION DIAGNOSIS:   Severe Malnutrition related to acute illness(acute renal failure) as evidenced by percent weight loss, energy intake < or equal to 50% for > or equal to 5 days(8.5% weight loss in less than 3 months).  GOAL:   Patient will meet greater than or equal to 90% of their needs  MONITOR:   PO intake, Supplement acceptance, Diet advancement, Labs  REASON FOR ASSESSMENT:   Malnutrition Screening Tool   ASSESSMENT:   57 year old male with PMH significant for CVA, GERD, HLD, HTN, bilateral renal cancer s/p cryoablation in 2012, DM type 2, diverticulitis, seizures, and anxiety and depression. Pt presented for nausea, emesis, upper abdominal pain, and decreased urine output for approximately 1-2 weeks.  Spoke with pt at bedside who reports having a poor appetite for 10 days PTA. Pt has been regurgitating "everything I ate" for more than 1 week PTA. Pt lives with Ms. Gwenlyn Found who grocery shops with him and cooks for him. Pt usually walks with a cane.  Pt states that he is currently feeling slightly nauseous but would like to try something to eat from [facility]. RD ordered apple juice and hot chocolate per pt request. RD let RN know about meal order.  Pt has not been able to eat much over the past 10 days. Pt states he has only had coffee and watermelon during this time.  Pt confirms he has lost weight recently. Pt states his UBW is between 265-278 lbs. Pt is unsure when he last weighed this. Per weight history in chart, pt has experienced an 8.5% weight loss in less than 3 months which is significant for  timeframe. Pt weighed 120.5 kg on 11/29/17 and 110.3 kg upon admission.  Medications reviewed and include: 40 mg Lipitor daily, sliding scale Novolog, 15 units Lantus BID  Labs reviewed: BUN 61 (H), creatinine 5.14 (H), hemoglobin A1C 11.4 (H) CBG's: 194, 233  NUTRITION - FOCUSED PHYSICAL EXAM:    Most Recent Value  Orbital Region  No depletion  Upper Arm Region  No depletion  Thoracic and Lumbar Region  No depletion  Buccal Region  No depletion  Temple Region  No depletion  Clavicle Bone Region  No depletion  Clavicle and Acromion Bone Region  Mild depletion  Scapular Bone Region  No depletion  Dorsal Hand  No depletion  Patellar Region  Mild depletion  Anterior Thigh Region  Mild depletion  Posterior Calf Region  Mild depletion  Edema (RD Assessment)  None  Hair  Reviewed  Eyes  Reviewed  Mouth  Reviewed  Skin  Reviewed  Nails  Reviewed       Diet Order:  Diet full liquid Room service appropriate? Yes; Fluid consistency: Thin  EDUCATION NEEDS:   No education needs have been identified at this time  Skin:  Skin Assessment: Reviewed RN Assessment  Last BM:  01/31/18  Height:   Ht Readings from Last 1 Encounters:  02/02/18 6\' 1"  (1.854 m)    Weight:   Wt Readings from Last 1 Encounters:  02/02/18 243 lb 2.7 oz (110.3 kg)    Ideal Body Weight:  83.6 kg  BMI:  Body mass index is 32.08 kg/m.  Estimated Nutritional Needs:   Kcal:  2400-2600 kcal/day  Protein:  130-145 grams/day  Fluid:  > 2.4 L/day    Gaynell Face, MS, RD, LDN Pager: [phone] Weekend/After Hours: [phone]

## 2018-02-02 NOTE — ED Notes (Signed)
Pt has not had to urinate, this nurse has repeatedly asked for urine.

## 2018-02-02 NOTE — Progress Notes (Signed)
Inpatient Diabetes Program Recommendations  AACE/ADA: New Consensus Statement on Inpatient Glycemic Control (2015)  Target Ranges:  Prepandial:   less than 140 mg/dL      Peak postprandial:   less than 180 mg/dL (1-2 hours)      Critically ill patients:  140 - 180 mg/dL   Lab Results  Component Value Date   GLUCAP 164 (H) 02/02/2018   HGBA1C 11.4 (H) 02/02/2018    Review of Glycemic Control Results for Brandon [name], Brandon [name] (MRN [id number]) as of 02/02/2018 13:03  Ref. Range 02/02/2018 03:02 02/02/2018 08:31 02/02/2018 12:29  Glucose-Capillary Latest Ref Range: 65 - 99 mg/dL 233 (H) 194 (H) 164 (H)   Diabetes history: DM2 Outpatient Diabetes medications: Lantus 45 units + Ozempic .25 q week Current orders for Inpatient glycemic control: Lantus 15 units bid + Novolog correction moderate tid + hs  Inpatient Diabetes Program Recommendations:   Spoke with pt about A1C 11.4  (which indicates an average blood glucose 280 over the past 2-3 months) results with them and explained what an A1C is, basic pathophysiology of DM Type 2, basic home care, basic diabetes diet nutrition principles, importance of checking CBGs and maintaining good CBG control to prevent long-term and short-term complications. Reviewed signs and symptoms of hyperglycemia and hypoglycemia and how to treat hypoglycemia at home. Also reviewed blood sugar goals at home.  RNs to provide ongoing basic DM education at bedside with this patient. Patient verbalizes understanding and shares that he limits sweets @ home and willing to limit amount of fruit juices as well.  Thank you, Nani Gasser. Kyran Whittier, RN, MSN, CDE  Diabetes Coordinator Inpatient Glycemic Control Team Team Pager [phone] (8am-5pm) 02/02/2018 1:17 PM

## 2018-02-03 ENCOUNTER — Encounter (HOSPITAL_COMMUNITY): Payer: Self-pay

## 2018-02-03 DIAGNOSIS — E43 Unspecified severe protein-calorie malnutrition: Secondary | ICD-10-CM

## 2018-02-03 LAB — COMPREHENSIVE METABOLIC PANEL
ALBUMIN: 3.7 g/dL (ref 3.5–5.0)
ALK PHOS: 97 U/L (ref 38–126)
ALT: 17 U/L (ref 17–63)
AST: 19 U/L (ref 15–41)
Anion gap: 9 (ref 5–15)
BUN: 42 mg/dL — AB (ref 6–20)
CALCIUM: 8.5 mg/dL — AB (ref 8.9–10.3)
CHLORIDE: 107 mmol/L (ref 101–111)
CO2: 22 mmol/L (ref 22–32)
CREATININE: 2.25 mg/dL — AB (ref 0.61–1.24)
GFR calc Af Amer: 36 mL/min — ABNORMAL LOW (ref >60)
GFR calc non Af Amer: 31 mL/min — ABNORMAL LOW (ref >60)
GLUCOSE: 217 mg/dL — AB (ref 65–99)
Potassium: 4.1 mmol/L (ref 3.5–5.1)
Sodium: 138 mmol/L (ref 135–145)
Total Bilirubin: 1.5 mg/dL — ABNORMAL HIGH (ref 0.3–1.2)
Total Protein: 6.6 g/dL (ref 6.5–8.1)

## 2018-02-03 LAB — GLUCOSE, CAPILLARY
GLUCOSE-CAPILLARY: 97 mg/dL (ref 65–99)
GLUCOSE-CAPILLARY: 184 mg/dL — AB (ref 65–99)
Glucose-Capillary: 176 mg/dL — ABNORMAL HIGH (ref 65–99)
Glucose-Capillary: 205 mg/dL — ABNORMAL HIGH (ref 65–99)

## 2018-02-03 LAB — CBC
HEMATOCRIT: 34.2 % — AB (ref 39.0–52.0)
Hemoglobin: 12.1 g/dL — ABNORMAL LOW (ref 13.0–17.0)
MCH: 30 pg (ref 26.0–34.0)
MCHC: 35.4 g/dL (ref 30.0–36.0)
MCV: 84.7 fL (ref 78.0–100.0)
PLATELETS: 204 10*3/uL (ref 150–400)
RBC: 4.04 MIL/uL — ABNORMAL LOW (ref 4.22–5.81)
RDW: 11.6 % (ref 11.5–15.5)
WBC: 3.5 10*3/uL — ABNORMAL LOW (ref 4.0–10.5)

## 2018-02-03 MED ORDER — SODIUM CHLORIDE 0.9 % IV SOLN
INTRAVENOUS | Status: AC
  Administered 2018-02-03 – 2018-02-04 (×3): via INTRAVENOUS

## 2018-02-03 MED ORDER — METOCLOPRAMIDE HCL 5 MG/ML IJ SOLN
5.0000 mg | Freq: Three times a day (TID) | INTRAMUSCULAR | Status: DC
  Administered 2018-02-03: 10 mg via INTRAVENOUS
  Administered 2018-02-03 – 2018-02-04 (×3): 5 mg via INTRAVENOUS
  Filled 2018-02-03 (×4): qty 2

## 2018-02-03 NOTE — Progress Notes (Signed)
PROGRESS NOTE    Brandon [name]  YIF:[id number] DOB: Sep 14, 1961 DOA: 02/01/2018 PCP: Brandon Hurl, PA-C   Brief Narrative: Brandon [name] is an 57 y.o. male past medical history of diabetes mellitus type 2, bilateral renal cell cancer status post cryoablation in 2012 diverticulitis who presented to the ED with several days of nausea vomiting and epigastric pain, with associated chills fever and Brandon [name] productive cough, he relates he lost about 20 pounds in the last 2 weeks  Assessment & Plan:   Principal Problem:   Acute renal failure superimposed on stage 3 chronic kidney disease (Brandon [name]) Active Problems:   History of seizure   Diabetes mellitus (Brandon [name])   Essential hypertension   Nausea and vomiting   ARF (acute renal failure) (Brandon [name])   Protein-calorie malnutrition, severe   Acute renal failure on chronic kidney disease stage III:  Patient's baseline creatinine previously had been noted to be 1.23 on 05/2017, but presents with Brandon [name] creatinine of 6.05 and BUN 16.  Improved to 2.25 today with IVF. - Admit to Brandon [name] MedSurg bed - Strict intake and output - Continue to monitor  Nausea and vomiting: Acute.  With history of poorly controlled DM, suspect diabetic gastroparesis may be diagnosis.  Benign abdomen on exam.   - Start reglan - EKG for QT - Will eventually need gastric emptying study, but this could be pursued outpatient if symptoms improve with reglan - Continue supportive care with IV fluids and antiemetics  Diabetes mellitus type 2: Last known hemoglobin A1c noted to be 11.1 on 10/10/2017.  Patient is on semaglutide. - Check hemoglobin A1c (11.4) - Continue home Lantus dose (reduced dose 15 u BID) - SSI  Essential hypertension: Stable - hold lisinopril due to kidney injury  Elevated total bilirubin - mild, follow   Weight loss: Acute.  Last 2 weeks patient reports losing 20-30 pounds.  Suspect diabetic gastroparesis or uncontrolled DM, but needs routine cancer  screening. - Check ESR, mildly elevated  History of renal cell carcinoma status post cryoablation  History of stroke with residual weakness: Stable  Mood disorder/seizure disorder - Continue Keppra  DVT prophylaxis: heparin Code Status: full code Family Communication: room mate at bedside Disposition Plan: pending improvement in N/V  Consultants:   none  Procedures:   none  Antimicrobials:   none    Subjective: Persistent N/V this morning.  Present for 2 weeks.  Every thime he eats.    Objective: Vitals:   02/02/18 0800 02/02/18 1840 02/02/18 2202 02/03/18 0609  BP: 139/87 140/86 (!) 147/107 118/85  Pulse:  70 81 78  Resp:  _0 Temp:  98.4 F (36.9 C) (!) 97.5 F (36.4 C) 97.9 F (36.6 C)  TempSrc:  Oral Oral Oral  SpO2:  100% 100% 100%  Weight:      Height:        Intake/Output Summary (Last 24 hours) at 02/03/2018 0949 Last data filed at 02/03/2018 8676 Gross per 24 hour  Intake 350 ml  Output 300 ml  Net 50 ml   Filed Weights   02/02/18 0250  Weight: 110.3 kg (243 lb 2.7 oz)    Examination:  General exam: Appears calm and comfortable  Respiratory system: Clear to auscultation. Respiratory effort normal. Cardiovascular system: S1 & S2 heard, RRR. No JVD, murmurs, rubs, gallops or clicks. No pedal edema. Gastrointestinal system: Abdomen is nondistended, soft and nontender. No organomegaly or masses felt. Normal bowel sounds heard. Central nervous system: Alert and oriented. No  focal neurological deficits. Skin: No rashes, lesions or ulcers Psychiatry: Judgement and insight appear normal. Mood & affect appropriate.     Data Reviewed: I have personally reviewed following labs and imaging studies  CBC: Recent Labs  Lab 02/01/18 1524 02/02/18 0347  WBC 6.1 5.9  HGB 14.4 13.2  HCT 40.4 37.9*  MCV 86.0 86.5  PLT 275 491   Basic Metabolic Panel: Recent Labs  Lab 02/01/18 1524 02/02/18 0347  NA 140 141  K 4.5 3.6  CL 97* 101   CO2 27 24  GLUCOSE 287* 239*  BUN 60* 61*  CREATININE 6.05* 5.14*  CALCIUM 9.3 8.4*   GFR: Estimated Creatinine Clearance: 21.4 mL/min (Brandon [name]) (by C-G formula based on SCr of 5.14 mg/dL (H)). Liver Function Tests: Recent Labs  Lab 02/01/18 1524  AST 17  ALT 19  ALKPHOS 123  BILITOT 2.3*  PROT 8.4*  ALBUMIN 4.6   Recent Labs  Lab 02/01/18 1524  LIPASE 38   No results for input(s): AMMONIA in the last 168 hours. Coagulation Profile: No results for input(s): INR, PROTIME in the last 168 hours. Cardiac Enzymes: Recent Labs  Lab 02/01/18 2259  TROPONINI <0.03   BNP (last 3 results) No results for input(s): PROBNP in the last 8760 hours. HbA1C: Recent Labs    02/02/18 0347  HGBA1C 11.4*   CBG: Recent Labs  Lab 02/02/18 0831 02/02/18 1229 02/02/18 1717 02/02/18 2158 02/03/18 0728  GLUCAP 194* 164* 157* 194* 97   Lipid Profile: No results for input(s): CHOL, HDL, LDLCALC, TRIG, CHOLHDL, LDLDIRECT in the last 72 hours. Thyroid Function Tests: No results for input(s): TSH, T4TOTAL, FREET4, T3FREE, THYROIDAB in the last 72 hours. Anemia Panel: No results for input(s): VITAMINB12, FOLATE, FERRITIN, TIBC, IRON, RETICCTPCT in the last 72 hours. Sepsis Labs: No results for input(s): PROCALCITON, LATICACIDVEN in the last 168 hours.  No results found for this or any previous visit (from the past 240 hour(s)).       Radiology Studies: Dg Chest 2 View  Result Date: 02/01/2018 CLINICAL DATA:  Cough EXAM: CHEST  2 VIEW COMPARISON:  10/17/2016 FINDINGS: Heart and mediastinal contours are within normal limits. No focal opacities or effusions. No acute bony abnormality. IMPRESSION: No active cardiopulmonary disease. Electronically Signed   By: Rolm Baptise M.D.   On: 02/01/2018 23:39   Ct Renal Stone Study  Result Date: 02/01/2018 CLINICAL DATA:  Abdominal pain with nausea vomiting and decreased urine output history of renal cell cancer EXAM: CT ABDOMEN AND PELVIS  WITHOUT CONTRAST TECHNIQUE: Multidetector CT imaging of the abdomen and pelvis was performed following the standard protocol without IV contrast. COMPARISON:  05/10/2017, 04/04/2017, 01/01/2016, 12/13/2014 FINDINGS: Lower chest: Lung bases demonstrate no acute consolidation or pleural effusion. Normal heart size. Hepatobiliary: No focal liver abnormality is seen. No gallstones, gallbladder wall thickening, or biliary dilatation. Pancreas: Unremarkable. No pancreatic ductal dilatation or surrounding inflammatory changes. Spleen: Normal in size without focal abnormality. Adrenals/Urinary Tract: Adrenal glands are within normal limits. Bilateral cryoablation defects are unchanged. Right sub capsular renal lesion difficult to appreciate without contrast. No hydronephrosis. No ureteral stone. Slightly thick-walled bladder but underdistended. Stomach/Bowel: Stomach is within normal limits. Appendix appears normal. No evidence of bowel wall thickening, distention, or inflammatory changes. Sigmoid colon diverticular disease without acute inflammation Vascular/Lymphatic: Nonaneurysmal aorta. Scattered atherosclerosis. No significantly enlarged lymph nodes Reproductive: Prostate is unremarkable. Other: Tiny fat in the inguinal regions. Negative for free air or free fluid. Small fat in the umbilicus Musculoskeletal: Diffuse  degenerative changes. Mild retrolisthesis of L5 on S1. No acute or suspicious finding. IMPRESSION: 1. Negative for hydronephrosis, nephrolithiasis or ureteral stone 2. Stable appearance of bilateral cryoablation defects allowing for absence of contrast. Known small subcapsular lesion on the right is difficult to see without contrast. 3. Sigmoid colon diverticular disease without acute inflammation Electronically Signed   By: Donavan Foil M.D.   On: 02/01/2018 23:39        Scheduled Meds: . atorvastatin  40 mg Oral Daily  . feeding supplement (GLUCERNA SHAKE)  237 mL Oral TID BM  . heparin  5,000  Units Subcutaneous Q8H  . Influenza vac split quadrivalent PF  0.5 mL Intramuscular Tomorrow-1000  . insulin aspart  0-15 Units Subcutaneous TID WC  . insulin aspart  0-5 Units Subcutaneous QHS  . insulin glargine  15 Units Subcutaneous BID  . levETIRAcetam  1,000 mg Oral Daily  . mouth rinse  15 mL Mouth Rinse BID  . metoCLOPramide (REGLAN) injection  5 mg Intravenous Q6H   Continuous Infusions:   LOS: 1 day    Time spent: over 30 min    Fayrene Helper, MD [facility] Pager [phone]  If 7PM-7AM, please contact night-coverage [email or web address] Password [facility] 02/03/2018, 9:49 AM

## 2018-02-04 LAB — BASIC METABOLIC PANEL
ANION GAP: 6 (ref 5–15)
BUN: 33 mg/dL — ABNORMAL HIGH (ref 6–20)
CALCIUM: 8.4 mg/dL — AB (ref 8.9–10.3)
CO2: 26 mmol/L (ref 22–32)
Chloride: 109 mmol/L (ref 101–111)
Creatinine, Ser: 1.93 mg/dL — ABNORMAL HIGH (ref 0.61–1.24)
GFR calc non Af Amer: 38 mL/min — ABNORMAL LOW (ref >60)
GFR, EST AFRICAN AMERICAN: 44 mL/min — AB (ref >60)
GLUCOSE: 87 mg/dL (ref 65–99)
Potassium: 3.7 mmol/L (ref 3.5–5.1)
Sodium: 141 mmol/L (ref 135–145)

## 2018-02-04 LAB — CBC
HEMATOCRIT: 31 % — AB (ref 39.0–52.0)
HEMOGLOBIN: 10.7 g/dL — AB (ref 13.0–17.0)
MCH: 29.4 pg (ref 26.0–34.0)
MCHC: 34.5 g/dL (ref 30.0–36.0)
MCV: 85.2 fL (ref 78.0–100.0)
Platelets: 190 10*3/uL (ref 150–400)
RBC: 3.64 MIL/uL — ABNORMAL LOW (ref 4.22–5.81)
RDW: 11.5 % (ref 11.5–15.5)
WBC: 3.9 10*3/uL — AB (ref 4.0–10.5)

## 2018-02-04 LAB — HEPATIC FUNCTION PANEL
ALBUMIN: 3.2 g/dL — AB (ref 3.5–5.0)
ALK PHOS: 83 U/L (ref 38–126)
ALT: 15 U/L — ABNORMAL LOW (ref 17–63)
AST: 15 U/L (ref 15–41)
BILIRUBIN TOTAL: 1.1 mg/dL (ref 0.3–1.2)
Bilirubin, Direct: 0.2 mg/dL (ref 0.1–0.5)
Indirect Bilirubin: 0.9 mg/dL (ref 0.3–0.9)
Total Protein: 6.1 g/dL — ABNORMAL LOW (ref 6.5–8.1)

## 2018-02-04 LAB — GLUCOSE, CAPILLARY
GLUCOSE-CAPILLARY: 171 mg/dL — AB (ref 65–99)
Glucose-Capillary: 105 mg/dL — ABNORMAL HIGH (ref 65–99)
Glucose-Capillary: 196 mg/dL — ABNORMAL HIGH (ref 65–99)
Glucose-Capillary: 206 mg/dL — ABNORMAL HIGH (ref 65–99)

## 2018-02-04 MED ORDER — POLYETHYLENE GLYCOL 3350 17 G PO PACK
17.0000 g | PACK | Freq: Every day | ORAL | Status: DC
  Administered 2018-02-04 – 2018-02-06 (×2): 17 g via ORAL
  Filled 2018-02-04 (×4): qty 1

## 2018-02-04 MED ORDER — AMLODIPINE BESYLATE 5 MG PO TABS
5.0000 mg | ORAL_TABLET | Freq: Every day | ORAL | Status: DC
  Administered 2018-02-04 – 2018-02-06 (×3): 5 mg via ORAL
  Filled 2018-02-04 (×4): qty 1

## 2018-02-04 MED ORDER — METOCLOPRAMIDE HCL 5 MG PO TABS
5.0000 mg | ORAL_TABLET | Freq: Three times a day (TID) | ORAL | Status: DC
  Administered 2018-02-04 – 2018-02-06 (×8): 5 mg via ORAL
  Filled 2018-02-04 (×8): qty 1

## 2018-02-04 NOTE — Progress Notes (Signed)
PROGRESS NOTE    Brandon [name]  JOI:[id number] DOB: 1961/07/08 DOA: 02/01/2018 PCP: Carlena Hurl, PA-C   Brief Narrative: Brandon [name] is an 57 y.o. male past medical history of diabetes mellitus type 2, bilateral renal cell cancer status post cryoablation in 2012 diverticulitis who presented to the ED with several days of nausea vomiting and epigastric pain, with associated chills fever and a productive cough, he relates he lost about 20 pounds in the last 2 weeks  Assessment & Plan:   Principal Problem:   Acute renal failure superimposed on stage 3 chronic kidney disease ([place]) Active Problems:   History of seizure   Diabetes mellitus ([place])   Essential hypertension   Nausea and vomiting   ARF (acute renal failure) ([place])   Protein-calorie malnutrition, severe   Acute renal failure on chronic kidney disease stage III:  Patient's baseline creatinine previously had been noted to be 1.23 on 05/2017, but presents with a creatinine of 6.05 and BUN 16.  Improved to 1.93 today with IVF. - Admit to a MedSurg bed - Strict intake and output - Continue to monitor  Nausea and vomiting: Acute.  With history of poorly controlled DM, suspect diabetic gastroparesis may be diagnosis.  He has persistent nausea and some abdominal discomfort with eating.  Points to RUQ and has RUQ tenderness on exam. - RUQ [place] - continue reglan, PO - repeat EKG for QT - Will eventually need gastric emptying study, but this could be pursued outpatient if symptoms improve with reglan - Continue supportive care with IV fluids and antiemetics  Diabetes mellitus type 2: Last known hemoglobin A1c noted to be 11.1 on 10/10/2017.  Patient is on semaglutide. - Check hemoglobin A1c (11.4) - Continue home Lantus dose (reduced dose 15 u BID) - SSI  Essential hypertension: Stable - hold lisinopril due to kidney injury  Elevated total bilirubin - improved  Weight loss: Acute.  Last 2 weeks patient reports  losing 20-30 pounds.  Suspect diabetic gastroparesis or uncontrolled DM, but needs routine cancer screening. - Check ESR, mildly elevated  History of renal cell carcinoma status post cryoablation  History of stroke with residual weakness: Stable  Mood disorder/seizure disorder - Continue Keppra  DVT prophylaxis: heparin Code Status: full code Family Communication: room mate at bedside Disposition Plan: pending improvement in N/V  Consultants:   none  Procedures:   none  Antimicrobials:   none    Subjective: Persistent nausea, pain with eating.  RUQ location.   Objective: Vitals:   02/03/18 1308 02/03/18 2203 02/04/18 0532 02/04/18 1430  BP: (!) 136/98 135/90 (!) 158/90 (!) 169/105  Pulse: 79 75 77 86  Resp: '16 18 18 18  ' Temp: 98 F (36.7 C) 98.6 F (37 C) 98 F (36.7 C) (!) 97.4 F (36.3 C)  TempSrc: Oral Oral Oral Oral  SpO2: 96% 98% 100% 99%  Weight:      Height:        Intake/Output Summary (Last 24 hours) at 02/04/2018 1647 Last data filed at 02/04/2018 1432 Gross per 24 hour  Intake 617 ml  Output 300 ml  Net 317 ml   Filed Weights   02/02/18 0250  Weight: 110.3 kg (243 lb 2.7 oz)    Examination:  General: No acute distress. Cardiovascular: Heart sounds show a regular rate, and rhythm. No gallops or rubs. No murmurs. No JVD. Lungs: Clear to auscultation bilaterally with good air movement. No rales, rhonchi or wheezes. Abdomen: Soft, TTP in RUQ, nondistended  with normal active bowel sounds. No masses. No hepatosplenomegaly. Neurological: Alert and oriented 3. Moves all extremities 4. Cranial nerves II through XII grossly intact. Skin: Warm and dry. No rashes or lesions. Extremities: No clubbing or cyanosis. No edema. Psychiatric: Mood and affect are normal. Insight and judgment are appropriate.     Data Reviewed: I have personally reviewed following labs and imaging studies  CBC: Recent Labs  Lab 02/01/18 1524 02/02/18 0347  02/03/18 1011 02/04/18 0352  WBC 6.1 5.9 3.5* 3.9*  HGB 14.4 13.2 12.1* 10.7*  HCT 40.4 37.9* 34.2* 31.0*  MCV 86.0 86.5 84.7 85.2  PLT 275 216 204 361   Basic Metabolic Panel: Recent Labs  Lab 02/01/18 1524 02/02/18 0347 02/03/18 1011 02/04/18 0352  NA 140 141 138 141  K 4.5 3.6 4.1 3.7  CL 97* 101 107 109  CO2 '27 24 22 26  ' GLUCOSE 287* 239* 217* 87  BUN 60* 61* 42* 33*  CREATININE 6.05* 5.14* 2.25* 1.93*  CALCIUM 9.3 8.4* 8.5* 8.4*   GFR: Estimated Creatinine Clearance: 57 mL/min (A) (by C-G formula based on SCr of 1.93 mg/dL (H)). Liver Function Tests: Recent Labs  Lab 02/01/18 1524 02/03/18 1011 02/04/18 0352  AST '17 19 15  ' ALT 19 17 15*  ALKPHOS 123 97 83  BILITOT 2.3* 1.5* 1.1  PROT 8.4* 6.6 6.1*  ALBUMIN 4.6 3.7 3.2*   Recent Labs  Lab 02/01/18 1524  LIPASE 38   No results for input(s): AMMONIA in the last 168 hours. Coagulation Profile: No results for input(s): INR, PROTIME in the last 168 hours. Cardiac Enzymes: Recent Labs  Lab 02/01/18 2259  TROPONINI <0.03   BNP (last 3 results) No results for input(s): PROBNP in the last 8760 hours. HbA1C: Recent Labs    02/02/18 0347  HGBA1C 11.4*   CBG: Recent Labs  Lab 02/03/18 1136 02/03/18 1652 02/03/18 2200 02/04/18 0813 02/04/18 1223  GLUCAP 176* 205* 184* 105* 196*   Lipid Profile: No results for input(s): CHOL, HDL, LDLCALC, TRIG, CHOLHDL, LDLDIRECT in the last 72 hours. Thyroid Function Tests: No results for input(s): TSH, T4TOTAL, FREET4, T3FREE, THYROIDAB in the last 72 hours. Anemia Panel: No results for input(s): VITAMINB12, FOLATE, FERRITIN, TIBC, IRON, RETICCTPCT in the last 72 hours. Sepsis Labs: No results for input(s): PROCALCITON, LATICACIDVEN in the last 168 hours.  No results found for this or any previous visit (from the past 240 hour(s)).       Radiology Studies: No results found.      Scheduled Meds: . amLODipine  5 mg Oral Daily  . atorvastatin  40  mg Oral Daily  . feeding supplement (GLUCERNA SHAKE)  237 mL Oral TID BM  . heparin  5,000 Units Subcutaneous Q8H  . Influenza vac split quadrivalent PF  0.5 mL Intramuscular Tomorrow-1000  . insulin aspart  0-15 Units Subcutaneous TID WC  . insulin aspart  0-5 Units Subcutaneous QHS  . insulin glargine  15 Units Subcutaneous BID  . levETIRAcetam  1,000 mg Oral Daily  . mouth rinse  15 mL Mouth Rinse BID  . metoCLOPramide  5 mg Oral TID AC & HS  . polyethylene glycol  17 g Oral Daily   Continuous Infusions:   LOS: 2 days    Time spent: over 30 min    Fayrene Helper, MD [facility] Pager [phone]  If 7PM-7AM, please contact night-coverage [email or web address] Password [facility] 02/04/2018, 4:47 PM

## 2018-02-05 ENCOUNTER — Inpatient Hospital Stay (HOSPITAL_COMMUNITY): Payer: Medicare Other

## 2018-02-05 LAB — COMPREHENSIVE METABOLIC PANEL
ALK PHOS: 84 U/L (ref 38–126)
ALT: 18 U/L (ref 17–63)
AST: 20 U/L (ref 15–41)
Albumin: 3.1 g/dL — ABNORMAL LOW (ref 3.5–5.0)
Anion gap: 6 (ref 5–15)
BUN: 19 mg/dL (ref 6–20)
CALCIUM: 8.6 mg/dL — AB (ref 8.9–10.3)
CHLORIDE: 112 mmol/L — AB (ref 101–111)
CO2: 24 mmol/L (ref 22–32)
CREATININE: 1.37 mg/dL — AB (ref 0.61–1.24)
GFR, EST NON AFRICAN AMERICAN: 57 mL/min — AB (ref >60)
Glucose, Bld: 75 mg/dL (ref 65–99)
Potassium: 3.9 mmol/L (ref 3.5–5.1)
Sodium: 142 mmol/L (ref 135–145)
Total Bilirubin: 0.8 mg/dL (ref 0.3–1.2)
Total Protein: 6 g/dL — ABNORMAL LOW (ref 6.5–8.1)

## 2018-02-05 LAB — GLUCOSE, CAPILLARY
GLUCOSE-CAPILLARY: 104 mg/dL — AB (ref 65–99)
GLUCOSE-CAPILLARY: 113 mg/dL — AB (ref 65–99)
GLUCOSE-CAPILLARY: 246 mg/dL — AB (ref 65–99)
GLUCOSE-CAPILLARY: 80 mg/dL (ref 65–99)
GLUCOSE-CAPILLARY: 80 mg/dL (ref 65–99)

## 2018-02-05 LAB — CBC
HCT: 31.2 % — ABNORMAL LOW (ref 39.0–52.0)
Hemoglobin: 10.9 g/dL — ABNORMAL LOW (ref 13.0–17.0)
MCH: 30.3 pg (ref 26.0–34.0)
MCHC: 34.9 g/dL (ref 30.0–36.0)
MCV: 86.7 fL (ref 78.0–100.0)
PLATELETS: 190 10*3/uL (ref 150–400)
RBC: 3.6 MIL/uL — AB (ref 4.22–5.81)
RDW: 11.7 % (ref 11.5–15.5)
WBC: 4.3 10*3/uL (ref 4.0–10.5)

## 2018-02-05 MED ORDER — IOPAMIDOL (ISOVUE-300) INJECTION 61%
INTRAVENOUS | Status: AC
  Administered 2018-02-05: 15 mL via ORAL
  Filled 2018-02-05: qty 30

## 2018-02-05 MED ORDER — IOPAMIDOL (ISOVUE-300) INJECTION 61%
INTRAVENOUS | Status: AC
  Administered 2018-02-05: 100 mL via INTRAMUSCULAR
  Filled 2018-02-05: qty 100

## 2018-02-05 MED ORDER — SODIUM CHLORIDE 0.9 % IV SOLN
INTRAVENOUS | Status: AC
  Administered 2018-02-05 – 2018-02-06 (×2): via INTRAVENOUS

## 2018-02-05 MED ORDER — SODIUM CHLORIDE 0.9 % IJ SOLN
INTRAMUSCULAR | Status: AC
  Filled 2018-02-05: qty 50

## 2018-02-05 MED ORDER — IOPAMIDOL (ISOVUE-300) INJECTION 61%
15.0000 mL | Freq: Once | INTRAVENOUS | Status: DC | PRN
  Administered 2018-02-05: 15 mL via ORAL
  Filled 2018-02-05: qty 30

## 2018-02-05 NOTE — Progress Notes (Signed)
PROGRESS NOTE    Brandon [name]  BSJ:[id number] DOB: [birth year] DOA: 02/01/2018 PCP: Carlena Hurl, PA-C   Brief Narrative: Brandon [name] is an 57 y.o. male past medical history of diabetes mellitus type 2, bilateral renal cell cancer status post cryoablation in 2012 diverticulitis who presented to the ED with several days of nausea vomiting and epigastric pain, with associated chills fever and Chad Donoghue productive cough, he relates he lost about 20 pounds in the last 2 weeks  Assessment & Plan:   Principal Problem:   Acute renal failure superimposed on stage 3 chronic kidney disease ([place]) Active Problems:   History of seizure   Diabetes mellitus ([place])   Essential hypertension   Nausea and vomiting   ARF (acute renal failure) ([place])   Protein-calorie malnutrition, severe   Acute renal failure on chronic kidney disease stage III:  Patient's baseline creatinine previously had been noted to be 1.23 on 05/2017, but presented with Arika Mainer creatinine of 6.05 and BUN 16.  Improving with IVF - Admit to [facility] bed - Strict intake and output - Continue to monitor  Nausea and vomiting  Abdominal Pain: Acute.  With history of poorly controlled DM, suspect diabetic gastroparesis may be diagnosis.  He has persistent nausea and some abdominal discomfort with eating.  Abdominal pain moving, notes to LLQ today and he's TTP on exam.  Notes hx of diverticulitis.  Denies N/V yesterday, had 2 episodes this morning.  Continue reglan for suspected gastroparesis, imaging as below. - RUQ [place] - limited by bowel gas, but no evidence of cholecystitis - CT abdomen/pelvis due to LLQ abdominal pain - continue reglan, PO - repeat EKG for QT - Will eventually need gastric emptying study, but this could be pursued outpatient if symptoms improve with reglan - Continue supportive care with IV fluids and antiemetics  Diabetes mellitus type 2: Last known hemoglobin A1c noted to be 11.1 on 10/10/2017.  Patient is on  semaglutide. - Check hemoglobin A1c (11.4) - Continue home Lantus dose (reduced dose 15 u BID) - SSI  Essential hypertension: elevated BP.  Start amlodipine.  - hold lisinopril due to kidney injury  Elevated total bilirubin - improved  Weight loss: Acute.  Last 2 weeks patient reports losing 20-30 pounds.  Suspect diabetic gastroparesis or uncontrolled DM, but needs routine cancer screening. - Check ESR, mildly elevated  History of renal cell carcinoma status post cryoablation  History of stroke with residual weakness: Stable  Mood disorder/seizure disorder - Continue Keppra  DVT prophylaxis: heparin Code Status: full code Family Communication: room mate at bedside Disposition Plan: pending improvement in N/V  Consultants:   none  Procedures:   none  Antimicrobials:   none    Subjective: Notes N/V improved with reglan.  Notes no vomiting yesterday, but 2 episodes this morning. LLQ pain.  Poorly described, but notes stabbing, off and on for maybe 2 weeks?  Describes history of ?diverticulitis  Objective: Vitals:   02/03/18 1308 02/03/18 2203 02/04/18 0532 02/04/18 1430  BP: (!) 136/98 135/90 (!) 158/90 (!) 169/105  Pulse: 79 75 77 86  Resp: '16 18 18 18  ' Temp: 98 F (36.7 C) 98.6 F (37 C) 98 F (36.7 C) (!) 97.4 F (36.3 C)  TempSrc: Oral Oral Oral Oral  SpO2: 96% 98% 100% 99%  Weight:      Height:        Intake/Output Summary (Last 24 hours) at 02/05/2018 1044 Last data filed at 02/04/2018 2100 Gross per 24  hour  Intake 260 ml  Output -  Net 260 ml   Filed Weights   02/02/18 0250  Weight: 110.3 kg (243 lb 2.7 oz)    Examination:  General: No acute distress. Cardiovascular: Heart sounds show Oz Gammel regular rate, and rhythm. No gallops or rubs. No murmurs. No JVD. Lungs: Clear to auscultation bilaterally with good air movement. No rales, rhonchi or wheezes. Abdomen: Soft, TTP in LLQ, nondistended Neurological: Alert and oriented 3. Moves all  extremities 4 . Cranial nerves II through XII grossly intact. Skin: Warm and dry. No rashes or lesions. Extremities: No clubbing or cyanosis. No edema.  Psychiatric: Mood and affect are normal. Insight and judgment are appropriate.     Data Reviewed: I have personally reviewed following labs and imaging studies  CBC: Recent Labs  Lab 02/01/18 1524 02/02/18 0347 02/03/18 1011 02/04/18 0352 02/05/18 0354  WBC 6.1 5.9 3.5* 3.9* 4.3  HGB 14.4 13.2 12.1* 10.7* 10.9*  HCT 40.4 37.9* 34.2* 31.0* 31.2*  MCV 86.0 86.5 84.7 85.2 86.7  PLT 275 216 204 190 244   Basic Metabolic Panel: Recent Labs  Lab 02/01/18 1524 02/02/18 0347 02/03/18 1011 02/04/18 0352 02/05/18 0354  NA 140 141 138 141 142  K 4.5 3.6 4.1 3.7 3.9  CL 97* 101 107 109 112*  CO2 '27 24 22 26 24  ' GLUCOSE 287* 239* 217* 87 75  BUN 60* 61* 42* 33* 19  CREATININE 6.05* 5.14* 2.25* 1.93* 1.37*  CALCIUM 9.3 8.4* 8.5* 8.4* 8.6*   GFR: Estimated Creatinine Clearance: 80.3 mL/min (Hughie Melroy) (by C-G formula based on SCr of 1.37 mg/dL (H)). Liver Function Tests: Recent Labs  Lab 02/01/18 1524 02/03/18 1011 02/04/18 0352 02/05/18 0354  AST '17 19 15 20  ' ALT 19 17 15* 18  ALKPHOS 123 97 83 84  BILITOT 2.3* 1.5* 1.1 0.8  PROT 8.4* 6.6 6.1* 6.0*  ALBUMIN 4.6 3.7 3.2* 3.1*   Recent Labs  Lab 02/01/18 1524  LIPASE 38   No results for input(s): AMMONIA in the last 168 hours. Coagulation Profile: No results for input(s): INR, PROTIME in the last 168 hours. Cardiac Enzymes: Recent Labs  Lab 02/01/18 2259  TROPONINI <0.03   BNP (last 3 results) No results for input(s): PROBNP in the last 8760 hours. HbA1C: No results for input(s): HGBA1C in the last 72 hours. CBG: Recent Labs  Lab 02/04/18 1223 02/04/18 1711 02/04/18 2038 02/05/18 0016 02/05/18 0741  GLUCAP 196* 206* 171* 104* 80   Lipid Profile: No results for input(s): CHOL, HDL, LDLCALC, TRIG, CHOLHDL, LDLDIRECT in the last 72 hours. Thyroid Function  Tests: No results for input(s): TSH, T4TOTAL, FREET4, T3FREE, THYROIDAB in the last 72 hours. Anemia Panel: No results for input(s): VITAMINB12, FOLATE, FERRITIN, TIBC, IRON, RETICCTPCT in the last 72 hours. Sepsis Labs: No results for input(s): PROCALCITON, LATICACIDVEN in the last 168 hours.  No results found for this or any previous visit (from the past 240 hour(s)).       Radiology Studies: US Abdomen Limited Ruq  Result Date: 02/05/2018 CLINICAL DATA:  57 year old male with abdominal pain and nausea for several days. Initial encounter. EXAM: ULTRASOUND ABDOMEN LIMITED RIGHT UPPER QUADRANT COMPARISON:  02/01/2018 CT. FINDINGS: Gallbladder: Gallbladder sludge. No gallstones, gallbladder wall thickening or pericholecystic fluid noted. Patient was not tender over this region during scanning per ultrasound technologist. Common bile duct: Diameter: 4.8 mm Liver: Evaluation left lobe liver limited by bowel gas. Otherwise no hepatic abnormality. Portal vein is patent on color  Doppler imaging with normal direction of blood flow towards the liver. IMPRESSION: Gallbladder sludge.  Gallbladder otherwise unremarkable. Limited evaluation of left lobe of liver by bowel gas. Otherwise no hepatic abnormality noted. Electronically Signed   By: Genia Del M.D.   On: 02/05/2018 10:32        Scheduled Meds: . amLODipine  5 mg Oral Daily  . atorvastatin  40 mg Oral Daily  . feeding supplement (GLUCERNA SHAKE)  237 mL Oral TID BM  . heparin  5,000 Units Subcutaneous Q8H  . Influenza vac split quadrivalent PF  0.5 mL Intramuscular Tomorrow-1000  . insulin aspart  0-15 Units Subcutaneous TID WC  . insulin aspart  0-5 Units Subcutaneous QHS  . insulin glargine  15 Units Subcutaneous BID  . levETIRAcetam  1,000 mg Oral Daily  . mouth rinse  15 mL Mouth Rinse BID  . metoCLOPramide  5 mg Oral TID AC & HS  . polyethylene glycol  17 g Oral Daily   Continuous Infusions: . sodium chloride       LOS: 3  days    Time spent: over 30 min    Fayrene Helper, MD [facility] Pager [phone]  If 7PM-7AM, please contact night-coverage [email or web address] Password [facility] 02/05/2018, 10:44 AM

## 2018-02-05 NOTE — Care Management Important Message (Signed)
Important Message  Patient Details  Name: ALFARD COCHRANE MRN: [id number] Date of Birth: 04-24-1961   Medicare Important Message Given:  Yes    Kerin Salen 02/05/2018, 12:09 [place] Message  Patient Details  Name: ALGER KERSTEIN MRN: [id number] Date of Birth: 08/16/1961   Medicare Important Message Given:  Yes    Kerin Salen 02/05/2018, 12:09 PM

## 2018-02-05 NOTE — Progress Notes (Signed)
Pt has had the  oral contrast in his room for over one hour. He has drank about 1/3 of one bottle. He has refused to drink anymore, refused to have the CT without contrast and demands to go home. Dr Florene Glen has been notified

## 2018-02-05 NOTE — Progress Notes (Signed)
Received consult for homelessness issues and Pt requested to speak with CSW per RN. Attempted meeting with pt but pt off floor for CT. Will attempt assessment again tomorrow.  Sharren Bridge, MSW, LCSW Clinical Social Work 02/05/2018 [phone]

## 2018-02-06 LAB — CBC
HCT: 32.7 % — ABNORMAL LOW (ref 39.0–52.0)
Hemoglobin: 11.3 g/dL — ABNORMAL LOW (ref 13.0–17.0)
MCH: 30.1 pg (ref 26.0–34.0)
MCHC: 34.6 g/dL (ref 30.0–36.0)
MCV: 87 fL (ref 78.0–100.0)
PLATELETS: 187 10*3/uL (ref 150–400)
RBC: 3.76 MIL/uL — ABNORMAL LOW (ref 4.22–5.81)
RDW: 11.8 % (ref 11.5–15.5)
WBC: 4 10*3/uL (ref 4.0–10.5)

## 2018-02-06 LAB — COMPREHENSIVE METABOLIC PANEL
ALT: 27 U/L (ref 17–63)
AST: 32 U/L (ref 15–41)
Albumin: 3.5 g/dL (ref 3.5–5.0)
Alkaline Phosphatase: 94 U/L (ref 38–126)
Anion gap: 7 (ref 5–15)
BUN: 14 mg/dL (ref 6–20)
CHLORIDE: 111 mmol/L (ref 101–111)
CO2: 24 mmol/L (ref 22–32)
Calcium: 8.8 mg/dL — ABNORMAL LOW (ref 8.9–10.3)
Creatinine, Ser: 1.29 mg/dL — ABNORMAL HIGH (ref 0.61–1.24)
Glucose, Bld: 103 mg/dL — ABNORMAL HIGH (ref 65–99)
POTASSIUM: 3.9 mmol/L (ref 3.5–5.1)
SODIUM: 142 mmol/L (ref 135–145)
Total Bilirubin: 0.8 mg/dL (ref 0.3–1.2)
Total Protein: 6.4 g/dL — ABNORMAL LOW (ref 6.5–8.1)

## 2018-02-06 LAB — GLUCOSE, CAPILLARY
GLUCOSE-CAPILLARY: 152 mg/dL — AB (ref 65–99)
Glucose-Capillary: 181 mg/dL — ABNORMAL HIGH (ref 65–99)

## 2018-02-06 LAB — MAGNESIUM: MAGNESIUM: 1.5 mg/dL — AB (ref 1.7–2.4)

## 2018-02-06 MED ORDER — AMLODIPINE BESYLATE 5 MG PO TABS: 5.0000 mg | ORAL_TABLET | Freq: Every day | ORAL | 0 refills | Status: DC

## 2018-02-06 MED ORDER — MAGNESIUM SULFATE 2 GM/50ML IV SOLN
2.0000 g | Freq: Once | INTRAVENOUS | Status: AC
  Administered 2018-02-06: 2 g via INTRAVENOUS
  Filled 2018-02-06: qty 50

## 2018-02-06 MED ORDER — METOCLOPRAMIDE HCL 5 MG PO TABS: 5.0000 mg | ORAL_TABLET | Freq: Three times a day (TID) | ORAL | 0 refills | Status: DC

## 2018-02-06 MED ORDER — POLYETHYLENE GLYCOL 3350 17 G PO PACK: 17.0000 g | PACK | Freq: Every day | ORAL | 0 refills | Status: DC

## 2018-02-06 NOTE — Clinical Social Work Note (Signed)
Clinical Social Work Assessment  Patient Details  Name: Brandon [name] MRN: [id number] Date of Birth: Jul 21, 1961  Date of referral:  02/06/18               Reason for consult:  Housing Concerns/Homelessness                Permission sought to share information with:    Permission granted to share information::     Name::        Agency::     Relationship::     Contact Information:     Housing/Transportation Living arrangements for the past 2 months:  (condo) Source of Information:  Patient, Medical Team Patient Interpreter Needed:  None Criminal Activity/Legal Involvement Pertinent to Current Situation/Hospitalization:  No - Comment as needed Significant Relationships:  Siblings Lives with:  Friends Do you feel safe going back to the place where you live?  Yes Need for family participation in patient care:  No (Comment)  Care giving concerns:  Pt admitted from home- resides in a condo with his friend/girlfriend.  Denies caregiving needs   Facilities manager / plan:  CSW consulted to address housing issues.  Met with pt at bedside- he was alert/oriented x 4- welcoming of CSW involvement, however, he denies having housing issues. Reports he has lived in [place] for several years but his family lives in [place] and "he was wondering if you had any resources for finding an apartment in [place]." CSW explained constraints of hospital resources- pt understanding. States, "My name is not on the lease at the [place], just my friend's is, so I eventually want to have my own place where I could feel more stable." Pt states he has considered putting in application with the [employer] as he is on a fixed income and knows affordable housing may be limited. CSW encouraged this and provided contact information for both [place].  Plan: pt to return home to [place] [facility] [place]. States he has friend who will be able to transport him home.  Employment status:  Disabled  (Comment on whether or not currently receiving Disability)(receives disability) Insurance information:  Medicare, Medicaid [place] PT Recommendations:  Home with [place] / Referral to community resources:  (housing authority)  Patient/Family's Response to care:  Pt appreciative of care. Notes, "I know there are a lot of patients in the hospital right now with the flu season so I really appreciate everyone's time"  Patient/Family's Understanding of and Emotional Response to Diagnosis, Current Treatment, and Prognosis:  Pt shows good understanding of his treatment and plan. States he is somewhat confused about being assessed for housing issues as he feels "I've never had housing issues, I just want to find a new apartment." Pt speaks positively about his tentative plan to relocate to [place]- "I've lost my parents and a cousin- I'm realizing I need to spend more time with my family there because time with them is precious."  Emotional Assessment Appearance:  Appears stated age Attitude/Demeanor/Rapport:  Engaged Affect (typically observed):  Accepting, Adaptable, Calm Orientation:  Oriented to Self, Oriented to Place, Oriented to  Time, Oriented to Situation Alcohol / Substance use:  Not Applicable Psych involvement (Current and /or in the community):  Outpatient Provider  Discharge Needs  Concerns to be addressed:  No discharge needs identified Readmission within the last 30 days:  No Current discharge risk:  None Barriers to Discharge:  No Barriers Identified   Nila Nephew, LCSW 02/06/2018,  12:53 PM  [phone]

## 2018-02-06 NOTE — Evaluation (Signed)
Physical Therapy Evaluation Patient Details Name: Brandon [name] MRN: [id number] DOB: [birth year] Today's Date: 02/06/2018   History of Present Illness  Brandon [name] is an 57 y.o. male past medical history of diabetes mellitus type 2, bilateral renal cell cancer status post cryoablation in 2012 diverticulitis who presented to the ED with several days of nausea vomiting and epigastric pain, with associated chills fever and a productive cough, he relates he lost about 20 pounds in the last 2 weeks.    Clinical Impression  Patient is a 57 year old male with a complicated medical history admitted to this venue for reasons listed above. Patient presents with deficits list below (in PT Problem List). Patient would benefit from skilled physical therapy to address these deficits and return patient to prior level of function.     Follow Up Recommendations Home health PT;Supervision - Intermittent    Equipment Recommendations  None recommended by PT    Recommendations for Other Services       Precautions / Restrictions Precautions Precautions: Fall Restrictions Weight Bearing Restrictions: No      Mobility  Bed Mobility               General bed mobility comments: patient sitting in chair when arrived  Transfers Overall transfer level: Modified independent               General transfer comment: patient relies on upper extremity strength for sit to/from stand transfers  Ambulation/Gait Ambulation/Gait assistance: Min guard Ambulation Distance (Feet): 150 Feet Assistive device: (IV pole) Gait Pattern/deviations: Step-through pattern;Decreased step length - right;Decreased step length - left;Decreased stride length Gait velocity: decreased   General Gait Details: patient wore knee brace during ambulation; as patient fatigued he became more apprehensive and unsure of left knee giving out on him  Stairs            Wheelchair Mobility    Modified Rankin  (Stroke Patients Only)       Balance Overall balance assessment: Needs assistance Sitting-balance support: No upper extremity supported Sitting balance-Leahy Scale: Good     Standing balance support: Single extremity supported(on IV pole) Standing balance-Leahy Scale: Fair                               Pertinent Vitals/Pain Pain Assessment: No/denies pain    Home Living Family/patient expects to be discharged to:: Private residence Living Arrangements: Non-relatives/Friends Available Help at Discharge: Friend(s) Type of Home: Apartment Home Access: Stairs to enter Entrance Stairs-Rails: Can reach both Entrance Stairs-Number of Steps: 2 flights of about 6 stairs Home Layout: One level Home Equipment: Grab bars - tub/shower;Cane - single point      Prior Function Level of Independence: Independent with assistive device(s)               Hand Dominance        Extremity/Trunk Assessment   Upper Extremity Assessment Upper Extremity Assessment: Overall WFL for tasks assessed;LUE deficits/detail;Generalized weakness LUE Deficits / Details: patient did c/o LUE feeling heavy/fatigued after shoulder flexion x10    Lower Extremity Assessment Lower Extremity Assessment: Overall WFL for tasks assessed;Generalized weakness;LLE deficits/detail LLE Deficits / Details: patient reports LLE will buckle on him when ambulating at times.       Communication   Communication: No difficulties  Cognition Arousal/Alertness: Awake/alert Behavior During Therapy: WFL for tasks assessed/performed Overall Cognitive Status: Within Functional Limits for tasks assessed  General Comments      Exercises General Exercises - Upper Extremity Shoulder Flexion: Seated;AROM;Strengthening;Both;10 reps General Exercises - Lower Extremity Long Arc Quad: Seated;AROM;Strengthening;Both;10 reps Hip Flexion/Marching:  Seated;AROM;Strengthening;Both;10 reps Toe Raises: Seated;AROM;Strengthening;Both;10 reps Heel Raises: Seated;AROM;Strengthening;Both;10 reps   Assessment/Plan    PT Assessment Patient needs continued PT services  PT Problem List Decreased strength;Decreased mobility;Decreased coordination;Decreased activity tolerance;Decreased balance       PT Treatment Interventions DME instruction;Gait training;Stair training;Functional mobility training;Neuromuscular re-education;Balance training;Therapeutic exercise;Patient/family education;Therapeutic activities(patient may benefit from rolling walker instruction/education)    PT Goals (Current goals can be found in the Care Plan section)  Acute Rehab PT Goals Patient Stated Goal: get back to Ms Bird's house. PT Goal Formulation: With patient Time For Goal Achievement: 02/13/18 Potential to Achieve Goals: Good    Frequency Min 3X/week   Barriers to discharge        Co-evaluation               AM-PAC PT "6 Clicks" Daily Activity  Outcome Measure Difficulty turning over in bed (including adjusting bedclothes, sheets and blankets)?: A Little Difficulty moving from lying on back to sitting on the side of the bed? : A Little Difficulty sitting down on and standing up from a chair with arms (e.g., wheelchair, bedside commode, etc,.)?: A Little Help needed moving to and from a bed to chair (including a wheelchair)?: A Little Help needed walking in hospital room?: A Little Help needed climbing 3-5 steps with a railing? : A Little 6 Click Score: 18    End of Session Equipment Utilized During Treatment: Gait belt;Other (comment)(personal knee brace) Activity Tolerance: Patient tolerated treatment well;Patient limited by fatigue Patient left: in chair;with call bell/phone within reach Nurse Communication: Mobility status PT Visit Diagnosis: Unsteadiness on feet (R26.81);History of falling (Z91.81);Muscle weakness (generalized)  (M62.81);Other abnormalities of gait and mobility (R26.89)    Time: 9562-1308 PT Time Calculation (min) (ACUTE ONLY): 28 min   Charges:   PT Evaluation $PT Eval Moderate Complexity: 1 Mod PT Treatments $Gait Training: 8-22 mins   PT G Codes:        Christ Fullenwider D. Hartnett-Rands, MS, PT [facility] [place] #[id number] 02/06/2018, 12:45 PM

## 2018-02-06 NOTE — Discharge Instructions (Signed)
[facility] Stay Proper nutrition can help your body recover from illness and injury.   Foods and beverages high in protein, vitamins, and minerals help rebuild muscle loss, promote healing, & reduce fall risk.   In addition to eating healthy foods, Meerab Maselli nutrition shake is an easy, delicious way to get the nutrition you need during and after your hospital stay  It is recommended that you continue to drink 2 bottles per day of:       Glucerna for at least 1 month (30 days) after your hospital stay   Tips for adding Alwin Lanigan nutrition shake into your routine: As allowed, drink one with vitamins or medications instead of water or juice Enjoy one as Humza Tallerico tasty mid-morning or afternoon snack Drink cold or make Joesphine Schemm milkshake out of it Drink one instead of milk with cereal or snacks Use as Santos Hardwick coffee creamer   Available at the following grocery stores and pharmacies:           * [place] [phone]            For COUPONS visit: www.ensure.com/join or [email or web address]-[email or web address]   Suggested Substitutions Ensure Plus = Boost Plus = Carnation Breakfast Essentials = Boost Compact Ensure Active Clear = Boost Breeze Glucerna Shake = Boost Glucose Control = Carnation Breakfast Essentials SUGAR FREE   HOUSING ASSISTANCE:  [place] http://www.gha-.org/ [employer]- [phone]  [email or web address]

## 2018-02-06 NOTE — Care Management Note (Signed)
Case Management Note  Patient Details  Name: Brandon [name] MRN: [id number] Date of Birth: May 22, 1961  Subjective/Objective:   57 yo admitted with Acute renal failure. Hx of stage 3 chronic kidney disease.                 Action/Plan: From home. Home Health orders received and choice offered for services. [facility] chosen and [facility] rep contacted for referral.  Expected Discharge Date:  02/06/18               Expected Discharge Plan:  [place]  In-House Referral:     Discharge planning Services  CM Consult  Post Acute Care Choice:  Home Health Choice offered to:  Patient  DME Arranged:    DME Agency:     HH Arranged:  RN, PT Van Agency:  [place]  Status of Service:  Completed, signed off  If discussed at [place] of Stay Meetings, dates discussed:    Additional CommentsLynnell Catalan, RN 02/06/2018, 1:03 PM  [phone]

## 2018-02-06 NOTE — Discharge Summary (Signed)
Physician Discharge Summary  [name] [name] DOB: 1961/09/02 DOA: 02/01/2018  PCP: Carlena Hurl, PA-C  Admit date: 02/01/2018 Discharge date: 02/06/2018  Time spent: over 30 minutes  Recommendations for Outpatient Follow-up:  1. Follow up outpatient CBC/CMP (attention to creatinine, restarted aspirin at discharge for hx of stroke) 2. Follow up response to reglan, would consider repeating EKG.  Discharged with 7 days worth, consider long term plan.  Suspect gastroparesis, consider outpatient gastric emptying study.   3. For weight loss, suspect 2/2 N/V and poor PO intake with suspected gastroparesis, but other causes like malignancy also on differential.  Ensure up to date with routine cancer screening and surveillance if needed for hx of renal cell cancer.  If weight loss improves with reglan, likely 2/2 suspected gastroparesis.  4. Follow BP, discontinued lisinopril and started amlodipine   Discharge Diagnoses:  Principal Problem:   Acute renal failure superimposed on stage 3 chronic kidney disease ([place]) Active Problems:   History of seizure   Diabetes mellitus ([place])   Essential hypertension   Nausea and vomiting   ARF (acute renal failure) ([place])   Protein-calorie malnutrition, severe   Discharge Condition: stable  Diet recommendation: heart healthy  Filed Weights   02/02/18 0250  Weight: 110.3 kg (243 lb 2.7 oz)    History of present illness:  Brandon [name] an 57 y.o.malepast medical history of diabetes mellitus type 2, bilateral renal cell cancer status post cryoablation in 2012 diverticulitis who presented to the ED with several days of nausea vomiting and epigastric pain, with associated chills fever and Liel Rudden productive cough, he relates he lost about 20 pounds in the last 2 weeks  He was found to have acute kidney injury and N/V.  His AKI improved with holding lisinopril and aspirin as well as giving IVF.  He had nausea and vomiting that was  persistent until reglan was started.  At that time his symptoms began to improve.  He had LLQ pain on the day prior to discharge and he had Trestin Vences CT scan that was negative for diverticulitis.  He was pain free on the day of discharge and tolerating PO.   Hospital Course:  Acute renal failureon chronic kidney disease stage III: Patient's baseline creatinine previously had been noted to be 1.23on 05/2017, but presented with Lazarus [place]bury creatinine of 6.05 and BUN 16.  Improving with IVF to 1.29 on the day of discharge.  Discontinued lisinopril at discharge.  Restart ASA.   Nausea and vomiting  Abdominal Pain:Acute.  With history of poorly controlled DM, suspect diabetic gastroparesis may be diagnosis.  He had persistent n/v during hospitalization which improved once reglan was started.  He had RUQ pain on 3/3, but [place] without cholecystitis.  He had some LLQ pain on 3/4, but Dekisha Mesmer CT scan was negative for any acute findings.  On the day of discharge he was pain free and tolerating PO without N/V with the reglan.  Follow up with PCP as outpatient.  Consider gastric emptying study.  - continue reglan, PO - repeat EKG for QT (repeat normal), would repeat again as outpatient if planning to continue - Will eventually need gastric emptying study, but this could be pursued outpatient  Diabetes mellitus type 2:Last known hemoglobin A1c noted to be 11.1 on 10/10/2017.Patient is on semaglutide. -Check hemoglobin A1c (11.4) -resume lantus at d/c (denies hypoglycemia at home) -SSI - will need better BG control   Essential hypertension:elevated BP.  Start amlodipine.  -stop lisinopril due to AKI  Elevated total bilirubin -improved  Weight loss:Acute. Last 2 weeks patient reports losing 20-30 pounds. Suspect diabetic gastroparesis or uncontrolled DM, but needs routine cancer screening and surveillance if needed for hx of renal cell. -Check ESR, mildly elevated  History of renal cell carcinoma status post  cryoablation  History of stroke with residual weakness:Stable  Mood disorder/seizure disorder -Continue Keppra  Procedures:  none  Consultations:  none  Discharge Exam: Vitals:   02/06/18 0233 02/06/18 0500  BP: (!) 162/99 (!) 139/96  Pulse: 72   Resp:    Temp:  97.8 F (36.6 C)  SpO2:  100%   Feels well.  Ready to go home.  No pain today.  N/V is improved.    General: No acute distress. Cardiovascular: Heart sounds show Aujanae Mccullum regular rate, and rhythm. No gallops or rubs. No murmurs. No JVD. Lungs: Clear to auscultation bilaterally with good air movement. No rales, rhonchi or wheezes. Abdomen: Soft, nontender, nondistended with normal active bowel sounds. No masses. No hepatosplenomegaly. Neurological: Alert and oriented 3. Moves all extremities 4. Cranial nerves II through XII grossly intact. Skin: Warm and dry. No rashes or lesions. Extremities: No clubbing or cyanosis. No edema.  Psychiatric: Mood and affect are normal. Insight and judgment are appropriate.   Discharge Instructions   Discharge Instructions    Call MD for:  difficulty breathing, headache or visual disturbances   Complete by:  As directed    Call MD for:  extreme fatigue   Complete by:  As directed    Call MD for:  persistant dizziness or light-headedness   Complete by:  As directed    Call MD for:  persistant nausea and vomiting   Complete by:  As directed    Call MD for:  redness, tenderness, or signs of infection (pain, swelling, redness, odor or green/yellow discharge around incision site)   Complete by:  As directed    Call MD for:  severe uncontrolled pain   Complete by:  As directed    Call MD for:  temperature >100.4   Complete by:  As directed    Diet - low sodium heart healthy   Complete by:  As directed    Discharge instructions   Complete by:  As directed    You were seen for nausea, vomiting, and weight loss.  I think you probably have gastroparesis, but this cannot truly  be diagnosed without Sharetta Ricchio gastric emptying study.  Your kidney function has improved.  Stop your lisinopril.  Start your aspirin back.  Repeat your labs again with your PCP in [place] few days to make sure your kidney function still looks ok.  Do NOT take medicines like ibuprofen or advil for pain as these can worsen your kidney function.  Please continue the reglan 3 times daily before meals.  Eat small meals, this should help with the nausea and vomiting.    Regarding your weight loss, please follow up with Dr. Glade Lloyd to make sure you're up to date with routine cancer screening and surveillance.  I think this may be because of your nausea and vomiting, but we should make sure nothing else is going on as well.  Return if you have new, recurrent, or worsening symptoms.   Please follow up with your PCP.  Ask your PCP to request records from this hospitalization so they know what was done and what the next steps are.   Increase activity slowly   Complete by:  As directed  Allergies as of 02/06/2018      Reactions   Morphine Itching      Medication List    STOP taking these medications   lisinopril 10 MG tablet Commonly known as:  PRINIVIL,ZESTRIL     TAKE these medications   amLODipine 5 MG tablet Commonly known as:  NORVASC Take 1 tablet (5 mg total) by mouth daily. Start taking on:  02/07/2018   aspirin 325 MG tablet Take 325 mg by mouth daily.   atorvastatin 40 MG tablet Commonly known as:  LIPITOR Take 1 tablet (40 mg total) by mouth daily.   blood glucose meter kit and supplies Dispense based on patient and insurance preference. Use up to four times daily as directed. (FOR ICD-9 250.00, 250.01).   LANTUS SOLOSTAR 100 UNIT/ML Solostar Pen Generic drug:  Insulin Glargine INJECT 45 UNITS INTO THE SKIN DAILY AT 10 PM What changed:  Another medication with the same name was removed. Continue taking this medication, and follow the directions you see here.   levETIRAcetam 500 MG  tablet Commonly known as:  KEPPRA 1/2 tablet po BID What changed:    how much to take  how to take this  when to take this  additional instructions   metoCLOPramide 5 MG tablet Commonly known as:  REGLAN Take 1 tablet (5 mg total) by mouth 3 (three) times daily before meals for 7 days. (follow up with PCP for long term plan for this med)   Pen Needles 32G X 4 MM Misc 1 each by Does not apply route daily. Use for insulin pens   polyethylene glycol packet Commonly known as:  MIRALAX / GLYCOLAX Take 17 g by mouth daily. Start taking on:  02/07/2018   Semaglutide 0.25 or 0.5 MG/DOSE Sopn Commonly known as:  OZEMPIC Inject 0.25 mg once Nafeesa Dils week into the skin.      Allergies  Allergen Reactions  . Morphine Itching   Follow-up Information    Carlena Hurl, PA-C Follow up.   Specialty:  Family Medicine Contact information: [address] [place] [address] [phone]            The results of significant diagnostics from this hospitalization (including imaging, microbiology, ancillary and laboratory) are listed below for reference.    Significant Diagnostic Studies: Dg Chest 2 View  Result Date: 02/01/2018 CLINICAL DATA:  Cough EXAM: CHEST  2 VIEW COMPARISON:  10/17/2016 FINDINGS: Heart and mediastinal contours are within normal limits. No focal opacities or effusions. No acute bony abnormality. IMPRESSION: No active cardiopulmonary disease. Electronically Signed   By: Rolm Baptise M.D.   On: 02/01/2018 23:39   Ct Abdomen Pelvis W Contrast  Result Date: 02/05/2018 CLINICAL DATA:  Hx of diabetes mellitus type 2, bilateral renal cell cancer status post cryoablation in 2012 diverticulitis who presented to the ED with several days of nausea vomiting and epigastric pain, with associated chills fever and Deshauna Cayson productive cough, he relates he lost about 20 pounds in the last 2 weeks EXAM: CT ABDOMEN AND PELVIS WITH CONTRAST TECHNIQUE: Multidetector CT imaging of the  abdomen and pelvis was performed using the standard protocol following bolus administration of intravenous contrast. CONTRAST:  147m ISOVUE-300 IOPAMIDOL (ISOVUE-300) INJECTION 61% COMPARISON:  02/01/2018 and 05/10/2017. FINDINGS: Lower chest: Small pulmonary nodules, largest pleural-based in the lateral right middle lobe measuring 6 mm. These are stable from the CT dated 05/10/2017 and from Darcella Shiffman chest CT dated 12/13/2014. No acute findings. Hepatobiliary: No focal liver abnormality is seen.  No gallstones, gallbladder wall thickening, or biliary dilatation. Pancreas: Unremarkable. No pancreatic ductal dilatation or surrounding inflammatory changes. Spleen: Normal in size without focal abnormality. Adrenals/Urinary Tract: No adrenal masses. Kidneys normal size, orientation and position with symmetric enhancement and excretion. There small cortical mixed attenuation masses with associated calcifications, 1 in the lateral midpole the right kidney, the other arising from the posteromedial midpole of the left kidney, both stable. Subcentimeter low-density lesion arises from the lower pole the right kidney consistent with Zaahir Pickney cyst. No other renal masses or lesions. No intrarenal stones. No hydronephrosis. Normal ureters. Normal bladder. Stomach/Bowel: Normal stomach and small bowel. There are multiple colonic diverticula. No diverticulitis. No other colon abnormality. Normal appendix visualized. Vascular/Lymphatic: No adenopathy. Mild aortic atherosclerosis. No aneurysm. Reproductive: Unremarkable. Other: No abdominal wall hernia or abnormality. No abdominopelvic ascites. Musculoskeletal: No fracture or acute finding. No osteoblastic or osteolytic lesions. IMPRESSION: 1. No acute findings within the abdomen or pelvis. 2. There are colonic diverticula without evidence of diverticulitis. 3. Small bilateral heterogeneous renal masses with associated calcifications are consistent with renal carcinoma status post cryoablation.  These are stable from the most recent prior CTs. 4. Small lung base nodules that are stable from prior CT scans dating back to over 2 years consistent with benign nodules. No follow-up indicated. 5. Aortic atherosclerosis. Electronically Signed   By: Lajean Manes M.D.   On: 02/05/2018 16:57   Ct Renal Stone Study  Result Date: 02/01/2018 CLINICAL DATA:  Abdominal pain with nausea vomiting and decreased urine output history of renal cell cancer EXAM: CT ABDOMEN AND PELVIS WITHOUT CONTRAST TECHNIQUE: Multidetector CT imaging of the abdomen and pelvis was performed following the standard protocol without IV contrast. COMPARISON:  05/10/2017, 04/04/2017, 01/01/2016, 12/13/2014 FINDINGS: Lower chest: Lung bases demonstrate no acute consolidation or pleural effusion. Normal heart size. Hepatobiliary: No focal liver abnormality is seen. No gallstones, gallbladder wall thickening, or biliary dilatation. Pancreas: Unremarkable. No pancreatic ductal dilatation or surrounding inflammatory changes. Spleen: Normal in size without focal abnormality. Adrenals/Urinary Tract: Adrenal glands are within normal limits. Bilateral cryoablation defects are unchanged. Right sub capsular renal lesion difficult to appreciate without contrast. No hydronephrosis. No ureteral stone. Slightly thick-walled bladder but underdistended. Stomach/Bowel: Stomach is within normal limits. Appendix appears normal. No evidence of bowel wall thickening, distention, or inflammatory changes. Sigmoid colon diverticular disease without acute inflammation Vascular/Lymphatic: Nonaneurysmal aorta. Scattered atherosclerosis. No significantly enlarged lymph nodes Reproductive: Prostate is unremarkable. Other: Tiny fat in the inguinal regions. Negative for free air or free fluid. Small fat in the umbilicus Musculoskeletal: Diffuse degenerative changes. Mild retrolisthesis of L5 on S1. No acute or suspicious finding. IMPRESSION: 1. Negative for hydronephrosis,  nephrolithiasis or ureteral stone 2. Stable appearance of bilateral cryoablation defects allowing for absence of contrast. Known small subcapsular lesion on the right is difficult to see without contrast. 3. Sigmoid colon diverticular disease without acute inflammation Electronically Signed   By: Donavan Foil M.D.   On: 02/01/2018 23:39   US Abdomen Limited Ruq  Result Date: 02/05/2018 CLINICAL DATA:  57 year old male with abdominal pain and nausea for several days. Initial encounter. EXAM: ULTRASOUND ABDOMEN LIMITED RIGHT UPPER QUADRANT COMPARISON:  02/01/2018 CT. FINDINGS: Gallbladder: Gallbladder sludge. No gallstones, gallbladder wall thickening or pericholecystic fluid noted. Patient was not tender over this region during scanning per ultrasound technologist. Common bile duct: Diameter: 4.8 mm Liver: Evaluation left lobe liver limited by bowel gas. Otherwise no hepatic abnormality. Portal vein is patent on color Doppler imaging with normal direction  of blood flow towards the liver. IMPRESSION: Gallbladder sludge.  Gallbladder otherwise unremarkable. Limited evaluation of left lobe of liver by bowel gas. Otherwise no hepatic abnormality noted. Electronically Signed   By: Genia Del M.D.   On: 02/05/2018 10:32    Microbiology: No results found for this or any previous visit (from the past 240 hour(s)).   Labs: Basic Metabolic Panel: Recent Labs  Lab 02/02/18 0347 02/03/18 1011 02/04/18 0352 02/05/18 0354 02/06/18 0409  NA 141 138 141 142 142  K 3.6 4.1 3.7 3.9 3.9  CL 101 107 109 112* 111  CO2 _0 GLUCOSE 239* 217* 87 75 103*  BUN 61* 42* 33* 19 14  CREATININE 5.14* 2.25* 1.93* 1.37* 1.29*  CALCIUM 8.4* 8.5* 8.4* 8.6* 8.8*  MG  --   --   --   --  1.5*   Liver Function Tests: Recent Labs  Lab 02/01/18 1524 02/03/18 1011 02/04/18 0352 02/05/18 0354 02/06/18 0409  AST _1 32  ALT 19 17 15* 18 27  ALKPHOS 123 97 83 84 94  BILITOT 2.3* 1.5* 1.1 0.8 0.8   PROT 8.4* 6.6 6.1* 6.0* 6.4*  ALBUMIN 4.6 3.7 3.2* 3.1* 3.5   Recent Labs  Lab 02/01/18 1524  LIPASE 38   No results for input(s): AMMONIA in the last 168 hours. CBC: Recent Labs  Lab 02/02/18 0347 02/03/18 1011 02/04/18 0352 02/05/18 0354 02/06/18 0409  WBC 5.9 3.5* 3.9* 4.3 4.0  HGB 13.2 12.1* 10.7* 10.9* 11.3*  HCT 37.9* 34.2* 31.0* 31.2* 32.7*  MCV 86.5 84.7 85.2 86.7 87.0  PLT 216 204 190 190 187   Cardiac Enzymes: Recent Labs  Lab 02/01/18 2259  TROPONINI <0.03   BNP: BNP (last 3 results) No results for input(s): BNP in the last 8760 hours.  ProBNP (last 3 results) No results for input(s): PROBNP in the last 8760 hours.  CBG: Recent Labs  Lab 02/05/18 1224 02/05/18 1713 02/05/18 2159 02/06/18 0751 02/06/18 1200  GLUCAP 113* 80 246* 181* 152*       Signed:  Fayrene Helper MD.  [facility] 02/06/2018, 11:07 PM

## 2018-02-07 ENCOUNTER — Telehealth: Payer: Self-pay | Admitting: Medical

## 2018-02-07 NOTE — Telephone Encounter (Signed)
Pt states he is light headed but feeling pretty good. Current and discharge medications were reconciled. Lisinopril was D/C and pt was started on amlodipine, Reglan and miralax. Lantus was charge to pm. All other medications stayed the same. Pt schedule a TOC appt and was informed to bring all medications with him. After hour protocol was discussed and pt verified understanding.

## 2018-02-08 DIAGNOSIS — H169 Unspecified keratitis: Secondary | ICD-10-CM | POA: Diagnosis not present

## 2018-02-08 DIAGNOSIS — H538 Other visual disturbances: Secondary | ICD-10-CM | POA: Diagnosis not present

## 2018-02-08 DIAGNOSIS — H53469 Homonymous bilateral field defects, unspecified side: Secondary | ICD-10-CM | POA: Diagnosis not present

## 2018-02-08 DIAGNOSIS — H2511 Age-related nuclear cataract, right eye: Secondary | ICD-10-CM | POA: Diagnosis not present

## 2018-02-08 DIAGNOSIS — Z0100 Encounter for examination of eyes and vision without abnormal findings: Secondary | ICD-10-CM | POA: Diagnosis not present

## 2018-02-12 ENCOUNTER — Telehealth: Payer: Self-pay | Admitting: Medical

## 2018-02-12 ENCOUNTER — Other Ambulatory Visit: Payer: Self-pay

## 2018-02-12 MED ORDER — ATORVASTATIN CALCIUM 40 MG PO TABS: 40.0000 mg | ORAL_TABLET | Freq: Every day | ORAL | 0 refills | Status: DC

## 2018-02-12 NOTE — Telephone Encounter (Signed)
done

## 2018-02-12 NOTE — Telephone Encounter (Signed)
Pt coming in for [facility] f/u appt on 02/16/18. Requesting refill in Atorvastatin 40 mg to [place] at [facility] [place] since he took his last pill this morning.

## 2018-02-16 ENCOUNTER — Inpatient Hospital Stay: Payer: Medicare Other | Admitting: Medical

## 2018-02-20 NOTE — Progress Notes (Addendum)
[facility]  PATIENT: Brandon [name] DOB: 1961/02/11   REASON FOR VISIT: Follow-up for seizure disorder, history of stroke, obstructive sleep apnea currently not using CPAP HISTORY FROM: Patient and significant other    HISTORY OF PRESENT ILLNESS:Brandon [name] is a 57 year old right-handed gentleman with an underlying medical history of ischemic stroke in December 2014 of the right MCA territory with mild residual left-sided weakness, anxiety, depression, diabetes, diverticulitis, ED, history of focal seizure, hyperlipidemia, hypertension, renal cell cancer, thyroid disease, and obesity, who presents for follow-up consultation of his obstructive sleep apnea, after recent sleep study testing. The patient is unaccompanied today. I first met him on 02/08/2017 at the request of Dr. Leonie Man, at which time the patient reported snoring, excessive daytime somnolence as well as witnessed apneas. He was advised to come back for sleep study testing. He had a baseline sleep study, followed by a CPAP titration study. His baseline sleep study from 02/23/2017 showed a sleep efficiency of 93.6 minutes, sleep latency 17 minutes, REM sleep was absent. He had near absence of slow-wave sleep, total AHI was 29.3 per hour, average oxygen saturation 95%, nadir in non-REM sleep was 79%. He had very mild PLMS with no significant arousals. Based on his medical history and sleep-related complaints as well as test results he was invited for a full night CPAP titration study, which he had on 03/22/2017, sleep efficiency was 87.4%, sleep latency 1 minute, REM latency reduced at 46.5 minutes, REM percentage was 23%, slow-wave sleep was 12.1%. He was titrated from 7 cm to 9 cm via full facemask. AHI on the final pressure was 0 per hour with nonsupine REM sleep achieved an O2 nadir of 94%. He had no significant PLMS during the study and indicated that he felt better after using CPAP. We called him in the interim on  05/10/2017 due to noncompliance with his CPAP. He reported that he was recently in the hospital. He was also advised to get in touch with his DME company to get a better mask or a effect.  Today, 07/03/2017 (all dictated new, as well as above notes, some dictation done in note pad or Word, outside of chart, may appear as copied): I reviewed his CPAP compliance data from 05/28/2017 through 06/26/2017 which is a total of 30 days, during which time he had a usage of one day with an average of 1 hour and 15 minutes only, essentially noncompliant, in the past 90 days he used his CPAP 22 days with 2% days greater than 4 hours was essentially noncompliant, AHI at 18.8, leak high, pressure at 9 cm. He reports that he has a lot of nasal discharge, clear, but he feels unable to use his CPAP. Of note, he was hospitalized in June secondary to sigmoid diverticulitis. UPDATE 12/26/2018[name], 57 year old male returns for follow-up history of stroke in 2014.  He also has history of seizure disorder and is on Keppra 500 mg half tablet twice a day. and reports recent tongue biting however he did not call the office.    He is currently not using his CPAP because his mask does not fit.  He claims his blood sugars range from 112-400.  He remains on Lantus.  He is on aspirin and Lipitor for secondary stroke prevention.  He has not had side effects of bruising or bleeding or myalgias.  He denies any recent falls.  He has a brace to his left knee.  He returns for reevaluation UPDATE3/28/19 [name],  57 year old male returns for follow-up with a history of stroke in 2014.  He is currently on aspirin for secondary stroke prevention without recurrent stroke or TIA symptoms.  He has minimal bruising and no bleeding he also has a history of seizure disorder and is on Keppra without side effects.  When last seen his Keppra level was checked and he has been noncompliant with his medication.  He did not get repeat level.   Patient also has a history of obstructive sleep apnea but turns his CPAP machine back and because his mask did not fit correctly.  Patient was made aware today of all the problems that untreated sleep apnea can cause and the reason for using CPAP.  He was made aware that he would need to have another sleep study in order to reestablish his CPAP.The  pt is motivated to be compliant with cpap.  He has diabetes which he reports is in fair control.  He had hospital admission the end of February for acute renal failure.  Records reviewed.  He returns for reevaluation  REVIEW OF SYSTEMS: Full 14 system review of systems performed and notable only for those listed, all others are neg:  Constitutional: Fatigue Cardiovascular: neg Ear/Nose/Throat: Hearing loss Skin: neg Eyes: neg Respiratory: neg Gastroitestinal: neg  Hematology/Lymphatic: neg  Endocrine: neg Musculoskeletal: Walking difficulty Allergy/Immunology: neg Neurological: Seizure disorder, headache, memory loss Psychiatric: Depression anxiety Sleep : Obstructive sleep apnea currently not using CPAP   ALLERGIES: Allergies  Allergen Reactions  . Morphine Itching    HOME MEDICATIONS: Outpatient Medications Prior to Visit  Medication Sig Dispense Refill  . amLODipine (NORVASC) 5 MG tablet Take 1 tablet (5 mg total) by mouth daily. 30 tablet 0  . aspirin 325 MG tablet Take 325 mg by mouth daily.     [name] atorvastatin (LIPITOR) 40 MG tablet Take 1 tablet (40 mg total) by mouth daily. 90 tablet 0  . blood glucose meter kit and supplies Dispense based on patient and insurance preference. Use up to four times daily as directed. (FOR ICD-9 250.00, 250.01). 1 each 0  . Insulin Pen Needle (PEN NEEDLES) 32G X 4 MM MISC 1 each by Does not apply route daily. Use for insulin pens 200 each 2  . LANTUS SOLOSTAR 100 UNIT/ML Solostar Pen INJECT 45 UNITS INTO THE SKIN DAILY AT 10 PM 15 pen 0  . levETIRAcetam (KEPPRA) 500 MG tablet 1/2 tablet po BID  (Patient taking differently: Take 1,000 mg by mouth daily. ) 90 tablet 1  . Semaglutide (OZEMPIC) 0.25 or 0.5 MG/DOSE SOPN Inject 0.25 mg once a week into the skin. 1.5 mL 1  . metoCLOPramide (REGLAN) 5 MG tablet Take 1 tablet (5 mg total) by mouth 3 (three) times daily before meals for 7 days. (follow up with PCP for long term plan for this med) 21 tablet 0  . polyethylene glycol (MIRALAX / GLYCOLAX) packet Take 17 g by mouth daily. (Patient not taking: Reported on 03/01/2018) 14 each 0   No facility-administered medications prior to visit.     PAST MEDICAL HISTORY: Past Medical History:  Diagnosis Date  . Acute ischemic stroke ([place]) 11/2013  . Allergy   . Anxiety   . Arthritis   . 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 Depression    BH Adm in [place] Depression  . Diabetes mellitus    DKA prior hospitalization  . Diverticulitis    s/p micorperforation Sept 2012-managed  conservatively by Gen surgery  . ED (erectile dysfunction)   . Focal seizure ([place]) 11/2013   due to ischemic stroke  . GERD (gastroesophageal reflux disease)   . Hiatal hernia   . Hyperlipidemia   . Hypertension   . Kidney tumor 09/2011   Renal cell CA  . Seizures ([place])    none since 2016, taking Keppra - maw  . Sleep apnea   . Thyroid disease    "weak thyroid" per MD  . Wears glasses     PAST SURGICAL HISTORY: Past Surgical History:  Procedure Laterality Date  . COLONOSCOPY    . IR RADIOLOGIST EVAL & MGMT  04/04/2017  . KIDNEY SURGERY     ablation of renal cell CA - 12/28, prior one was October 2012-Dr. Kathlene Cote  . TEE WITHOUT CARDIOVERSION N/A 11/19/2013   Procedure: TRANSESOPHAGEAL ECHOCARDIOGRAM (TEE);  Surgeon: Lelon Perla, MD;  Location[facility] ENDOSCOPY;  Service: Cardiovascular;  Laterality: N/A;    FAMILY HISTORY: Family History  Problem Relation Age of Onset  . Diabetes Father   . Prostate cancer Other   . Stomach cancer Other   . Colon cancer Neg  Hx   . Esophageal cancer Neg Hx   . Rectal cancer Neg Hx     SOCIAL HISTORY: Social History   Socioeconomic History  . Marital status: Single    Spouse name: Not on file  . Number of children: 2  . Years of education: college  . Highest education level: Not on file  Occupational History    Employer: UNEMPLOYED  Social Needs  . Financial resource strain: Not on file  . Food insecurity:    Worry: Not on file    Inability: Not on file  . Transportation needs:    Medical: Not on file    Non-medical: Not on file  Tobacco Use  . Smoking status: Never Smoker  . Smokeless tobacco: Never Used  Substance and Sexual Activity  . Alcohol use: Yes    Comment: rarely  . Drug use: No  . Sexual activity: Never  Lifestyle  . Physical activity:    Days per week: Not on file    Minutes per session: Not on file  . Stress: Not on file  Relationships  . Social connections:    Talks on phone: Not on file    Gets together: Not on file    Attends religious service: Not on file    Active member of club or organization: Not on file    Attends meetings of clubs or organizations: Not on file    Relationship status: Not on file  . Intimate partner violence:    Fear of current or ex partner: Not on file    Emotionally abused: Not on file    Physically abused: Not on file    Forced sexual activity: Not on file  Other Topics Concern  . Not on file  Social History Narrative   Patient lives at home with his family.   Education. Two years of college.   Not working.   Right handed.   Caffeine one soda daily. Drinks coffee      PHYSICAL EXAM  Vitals:   03/01/18 0904  BP: (!) 154/94  Pulse: 86  Weight: 250 lb 9.6 oz (113.7 kg)   Body mass index is 33.06 kg/m.  Generalized: Well developed, in no acute distress , well groomed Head: normocephalic and atraumatic,. Oropharynx benign  Neck: Supple, no carotid bruits  Cardiac: Regular rate rhythm,  no murmur  Musculoskeletal: No deformity   Skin trace pitting edema in both feet  Neurological examination   Mentation: Alert oriented to time, place, history taking. Attention span and concentration appropriate. Recent and remote memory intact.  Follows all commands speech is clear but has intermittent stutter    Cranial nerve II-XII: .Pupils were equal round reactive to light extraocular movements were full, visual field were full on confrontational test. Facial sensation and strength were normal. hearing was intact to finger rubbing bilaterally. Uvula tongue midline. head turning and shoulder shrug were normal and symmetric.Tongue protrusion into cheek strength was normal. Motor: normal bulk and tone, full strength in the BUE, BLE, fine finger movements normal, no pronator drift. No focal weakness Sensory: normal and symmetric to light touch, in the upper and lower extremities Coordination: finger-nose-finger, heel-to-shin bilaterally, no dysmetria, no tremor Reflexes: 1+ upper lower and symmetric, plantar responses were flexor bilaterally. Gait and Station: Rising up from seated position with pushoff ,wide-based gait, no assistive device no difficulty with turns.  Unable to tandem DIAGNOSTIC DATA (LABS, IMAGING, TESTING) - I reviewed patient records, labs, notes, testing and imaging myself where available.  Lab Results  Component Value Date   WBC 4.0 02/06/2018   HGB 11.3 (L) 02/06/2018   HCT 32.7 (L) 02/06/2018   MCV 87.0 02/06/2018   PLT 187 02/06/2018      Component Value Date/Time   NA 142 02/06/2018 0409   K 3.9 02/06/2018 0409   CL 111 02/06/2018 0409   CO2 24 02/06/2018 0409   GLUCOSE 103 (H) 02/06/2018 0409   BUN 14 02/06/2018 0409   CREATININE 1.29 (H) 02/06/2018 0409   CREATININE 1.23 06/02/2017 0719   CALCIUM 8.8 (L) 02/06/2018 0409   PROT 6.4 (L) 02/06/2018 0409   ALBUMIN 3.5 02/06/2018 0409   AST 32 02/06/2018 0409   ALT 27 02/06/2018 0409   ALKPHOS 94 02/06/2018 0409   BILITOT 0.8 02/06/2018 0409    GFRNONAA >60 02/06/2018 0409   GFRNONAA 86 05/21/2013 1045   GFRAA >60 02/06/2018 0409   GFRAA >89 05/21/2013 1045   Lab Results  Component Value Date   CHOL 156 12/29/2016   HDL 39 (L) 12/29/2016   LDLCALC 92 12/29/2016   TRIG 123 12/29/2016   CHOLHDL 4.0 12/29/2016   Lab Results  Component Value Date   HGBA1C 11.4 (H) 02/02/2018   Lab Results  Component Value Date   VITAMINB12 365 12/29/2016   Lab Results  Component Value Date   TSH 0.41 12/29/2016      ASSESSMENT AND PLAN 63year African-American male with remote history of right middle cerebral artery infarct  of cryptogenic etiology and partial seizures. Chronic headaches likely mixed tension headaches with vascular component.  Obstructive sleep apnea currently not compliant with CPAP however after reviewing the ramifications of untreated sleep apnea the  pt is motivated to be compliant with cpap and have sleep study repeated.[name]     PLAN: Keppra level today Continue Keppra at current dose for now will refill once labs back Continue aspirin and Lipitor for secondary stroke prevention, LDL cholesterol goal below 70 mg percent, diabetes with hemoglobin A1c goal below 6.5 and blood pressure goal below 130/90.  Patient currently not using CPAP, wished to start back will reorder sleep study patient is  motivated to be compliant Keep blood sugars in good control I explained in particular the risks and ramifications of untreated moderate to severe OSA, especially with respect to cardiovascular disease  including congestive  heart failure, difficult to treat hypertension, cardiac arrhythmias, or stroke. Even type 2 diabetes has, in part, been linked to untreated OSA. Symptoms of untreated OSA include daytime sleepiness, memory problems, mood irritability and mood disorder such as depression and anxiety, lack of energy, as well as recurrent headaches. Follow-up after sleep study for CPAP compliance I spent 25 minutes in total face to  face time with the patient more than 50% of which was spent counseling and coordination of care, reviewing test results reviewing medications and discussing and reviewing the diagnosis of stroke and management of risk factors, seizure disorder and importance of being compliant with CPAP Dennie Bible, [facility], [facility], APRN  [facility] [address] [place], [place] [address] [phone]  I reviewed the above note and documentation by the Nurse Practitioner and agree with the history, physical exam, assessment and plan as outlined above. I was immediately available for face-to-face consultation. Star Age, MD, PhD [facility])

## 2018-02-26 ENCOUNTER — Other Ambulatory Visit: Payer: Self-pay

## 2018-02-26 ENCOUNTER — Telehealth: Payer: Self-pay | Admitting: Medical

## 2018-02-26 NOTE — Telephone Encounter (Addendum)
New Message   *STAT* If patient is at the pharmacy, call can be transferred to refill team.   1. Which medications need to be refilled? (please list name of each medication and dose if known)  Lantus Solostar 100 unit/ML Inject 50 units into the skin once daily Lisinopril 10 mg tablet once daily  2. Which pharmacy/location (including street and city if local pharmacy) is medication to be sent to? [phone], [address] [place], [place]  3. Do they need a 30 day or 90 day supply?  90 day supply

## 2018-02-26 NOTE — Telephone Encounter (Signed)
Please advise if okay to submit? Has not been seen regarding diabetes. Thank you!

## 2018-02-26 NOTE — Telephone Encounter (Signed)
No, send request back or to endo.  He sees endocrinology for diabetes, so they fill this

## 2018-03-01 ENCOUNTER — Encounter: Payer: Self-pay | Admitting: Nurse Practitioner

## 2018-03-01 ENCOUNTER — Ambulatory Visit (INDEPENDENT_AMBULATORY_CARE_PROVIDER_SITE_OTHER): Payer: Medicare Other | Admitting: Nurse Practitioner

## 2018-03-01 VITALS — BP 154/94 | HR 86 | Wt 250.6 lb

## 2018-03-01 DIAGNOSIS — I10 Essential (primary) hypertension: Secondary | ICD-10-CM | POA: Diagnosis not present

## 2018-03-01 DIAGNOSIS — E785 Hyperlipidemia, unspecified: Secondary | ICD-10-CM | POA: Diagnosis not present

## 2018-03-01 DIAGNOSIS — E1169 Type 2 diabetes mellitus with other specified complication: Secondary | ICD-10-CM | POA: Diagnosis not present

## 2018-03-01 DIAGNOSIS — G4733 Obstructive sleep apnea (adult) (pediatric): Secondary | ICD-10-CM

## 2018-03-01 DIAGNOSIS — Z8673 Personal history of transient ischemic attack (TIA), and cerebral infarction without residual deficits: Secondary | ICD-10-CM | POA: Diagnosis not present

## 2018-03-01 DIAGNOSIS — G40909 Epilepsy, unspecified, not intractable, without status epilepticus: Secondary | ICD-10-CM

## 2018-03-01 NOTE — Patient Instructions (Signed)
Keppra level today Continue Keppra at current dose for now will refill Continue aspirin and Lipitor for secondary stroke prevention, LDL cholesterol goal below 70 mg percent, diabetes with hemoglobin A1c goal below 6.5 and blood pressure goal below 130/90.  Patient currently not using CPAP, wished to start back will reorder sleep study Keep blood sugars in good control I explained in particular the risks and ramifications of untreated moderate to severe OSA, especially with respect to cardiovascular disease  including congestive heart failure, difficult to treat hypertension, cardiac arrhythmias, or stroke. Even type 2 diabetes has, in part, been linked to untreated OSA. Symptoms of untreated OSA include daytime sleepiness, memory problems, mood irritability and mood disorder such as depression and anxiety, lack of energy, as well as recurrent headaches. Follow-up after sleep study for CPAP compliance

## 2018-03-05 ENCOUNTER — Other Ambulatory Visit: Payer: Self-pay | Admitting: Medical

## 2018-03-05 ENCOUNTER — Telehealth: Payer: Self-pay

## 2018-03-05 ENCOUNTER — Other Ambulatory Visit: Payer: Self-pay | Admitting: Nurse Practitioner

## 2018-03-05 LAB — LEVETIRACETAM LEVEL: LEVETIRACETAM: 15.2 ug/mL (ref 10.0–40.0)

## 2018-03-05 MED ORDER — LEVETIRACETAM 500 MG PO TABS: 1000.0000 mg | ORAL_TABLET | Freq: Every day | ORAL | 11 refills | Status: DC

## 2018-03-05 NOTE — Telephone Encounter (Signed)
-----   Message from Dennie Bible, NP sent at 03/05/2018 11:45 AM EDT ----- Good level of Keppra continue same dose will refill.  Please call the patient

## 2018-03-05 NOTE — Telephone Encounter (Signed)
I spoke with Barbette Or, ok per dpr. She is aware of results and voiced understanding and appreciation.

## 2018-03-12 ENCOUNTER — Telehealth: Payer: Self-pay

## 2018-03-12 NOTE — Telephone Encounter (Signed)
error 

## 2018-03-12 NOTE — Telephone Encounter (Signed)
We have attempted to call the patient two times to schedule sleep study.  Patient has been unavailable at the phone numbers we have on file and has not returned our calls.  At this point we will send a letter asking patient to please contact the sleep lab to schedule their sleep study.  If patient calls back we will schedule them for their sleep study. 

## 2018-03-15 ENCOUNTER — Inpatient Hospital Stay: Payer: Medicare Other | Admitting: Medical

## 2018-03-15 ENCOUNTER — Encounter: Payer: Self-pay | Admitting: Medical

## 2018-03-15 ENCOUNTER — Ambulatory Visit (INDEPENDENT_AMBULATORY_CARE_PROVIDER_SITE_OTHER): Payer: Medicare Other | Admitting: Medical

## 2018-03-15 VITALS — BP 124/84 | HR 78 | Ht 72.0 in | Wt 261.4 lb

## 2018-03-15 DIAGNOSIS — Z794 Long term (current) use of insulin: Secondary | ICD-10-CM

## 2018-03-15 DIAGNOSIS — R112 Nausea with vomiting, unspecified: Secondary | ICD-10-CM

## 2018-03-15 DIAGNOSIS — E1365 Other specified diabetes mellitus with hyperglycemia: Secondary | ICD-10-CM

## 2018-03-15 DIAGNOSIS — R635 Abnormal weight gain: Secondary | ICD-10-CM | POA: Insufficient documentation

## 2018-03-15 DIAGNOSIS — N183 Chronic kidney disease, stage 3 (moderate): Secondary | ICD-10-CM

## 2018-03-15 DIAGNOSIS — N179 Acute kidney failure, unspecified: Secondary | ICD-10-CM | POA: Diagnosis not present

## 2018-03-15 DIAGNOSIS — E119 Type 2 diabetes mellitus without complications: Secondary | ICD-10-CM

## 2018-03-15 DIAGNOSIS — R609 Edema, unspecified: Secondary | ICD-10-CM | POA: Insufficient documentation

## 2018-03-15 DIAGNOSIS — IMO0001 Reserved for inherently not codable concepts without codable children: Secondary | ICD-10-CM

## 2018-03-15 DIAGNOSIS — I10 Essential (primary) hypertension: Secondary | ICD-10-CM

## 2018-03-15 MED ORDER — METOCLOPRAMIDE HCL 5 MG PO TABS: 5.0000 mg | ORAL_TABLET | Freq: Three times a day (TID) | ORAL | 0 refills | Status: DC

## 2018-03-15 MED ORDER — POTASSIUM CHLORIDE ER 10 MEQ PO TBCR: 10.0000 meq | EXTENDED_RELEASE_TABLET | Freq: Every day | ORAL | 1 refills | Status: DC

## 2018-03-15 MED ORDER — FUROSEMIDE 20 MG PO TABS
20.0000 mg | ORAL_TABLET | Freq: Every day | ORAL | 1 refills | Status: DC
Start: 2018-03-15 — End: 2018-04-24

## 2018-03-15 MED ORDER — AMLODIPINE BESYLATE 5 MG PO TABS: 5.0000 mg | ORAL_TABLET | Freq: Every day | ORAL | 0 refills | Status: DC

## 2018-03-15 MED ORDER — INSULIN GLARGINE 100 UNIT/ML SOLOSTAR PEN: 45.0000 [IU] | PEN_INJECTOR | Freq: Every day | SUBCUTANEOUS | 2 refills | Status: DC

## 2018-03-15 MED ORDER — ASPIRIN 325 MG PO TABS: 325.0000 mg | ORAL_TABLET | Freq: Every day | ORAL | 3 refills | Status: DC

## 2018-03-15 MED ORDER — POLYETHYLENE GLYCOL 3350 17 G PO PACK: 17.0000 g | PACK | Freq: Every day | ORAL | 0 refills | Status: DC

## 2018-03-15 MED ORDER — PEN NEEDLES 32G X 4 MM MISC: 1.0000 | Freq: Every day | 2 refills | Status: DC

## 2018-03-15 MED ORDER — SEMAGLUTIDE(0.25 OR 0.5MG/DOS) 2 MG/1.5ML ~~LOC~~ SOPN: 0.2500 mg | PEN_INJECTOR | SUBCUTANEOUS | 2 refills | Status: DC

## 2018-03-15 NOTE — Patient Instructions (Addendum)
Recommendations: Diabetes  We are referring you to an endocrinologist/diabetes specialist here in [place]  In the meantime continue Ozempic weekly injection  Continue Lantus 40 units in the evening.  You can increase 2 units per week of the Lantus until morning sugars are under 130  High blood pressure and swelling  Continue Amlodipine 5mg  daily  Begin Lasix 20mg  daily fluid pill in the morning  Begin K-dur potassium daily in the morning with the Lasix  Measure your weight daily  If you gain 3 or more pounds in a day, then call or come in  Nausea, vomiting  Restart the Reglan and Miralax given in the hospital  I refilled this  We are referring you back to Gastroenterology, Dr. Havery Moros for evaluation of possible gastroparesis  We will call with lab results  I have seen you in the past for mood swings, depression.   I have referred you in the past to psychiatry and counseling.  I am not sure why you aren't seeing a mental health provider, but I have listed contact information below.   RESOURCES in [place], [place]  If you are experiencing a mental health crisis or an emergency, please call 911 or go to the nearest emergency department.  [facility]   [phone] [facility]  [phone] [facility]   [phone]  Suicide Hotline 1-800-Suicide (701)007-4648)  National Suicide Prevention Lifeline 816-674-9208  575-078-8056)  Domestic Violence, Rape/Crisis - [place] 952-761-2280  The [employer] Violence Hotline 1-800-799-SAFE 507-507-8756)  To report Child or Elder Abuse, please call: [facility], [place] Police Department  [phone] [facility] Department  [place] [phone]  Teen Crisis line 4163197239 or [phone]     Psychiatry and Counseling services  [facility] [place], [place], [place] [address] [phone]  Lina Sayre, therapist Dr. Lynder Parents, psychiatrist Dr. Milana Huntsman, child psychiatrist   Dr. Launa Flight [address], [place], [place] [address] [phone]   Dr. Chucky May, psychiatry [address] [place], [place] [address] [phone]   [place], [place], [place] [address] [phone]   [facility] [place], [place], [place] [address] [phone]    Counseling Services (NON- psychiatrist offices)  [facility] [address], [place], [place] [address] [phone]   [facility] [phone] [place], [place], [place] [address]   [facility] [phone]  [email or web address] [address] [place], [place], [place] [address]   Merrianne M. Clarene Reamer, therapist [phone] [address] [place], [place] [address]   [facility] [phone] [address] [place], [place] [address]   Vic Ripper, therapist [phone] [address], [place], [place] [address]   [facility] [address] [place], [place],  [address]

## 2018-03-15 NOTE — Progress Notes (Signed)
Subjective: Chief Complaint  Patient presents with  . Hospitalization Follow-up    continued swelling, gastro issues   Here for hospitalization f/u.  Here with girlfriend  Admit date: 02/01/2018 Discharge date: 02/06/2018  Discharge Diagnoses:  Principal Problem:   Acute renal failure superimposed on stage 3 chronic kidney disease ([place]) Active Problems:   History of seizure   Diabetes mellitus ([place])   Essential hypertension   Nausea and vomiting   ARF (acute renal failure) (HCC)   Protein-calorie malnutrition, severe  Was seen for nausea, vomiting, suspected gastroparesis and acute renal failure.    Since discharge, the nausea and vomiting has improved on the Reglan and Miralax, but he ran out of those 2 medications.  He was also started on Amlodipine.  He was d/c off Lisinopril with this hospitalization.   Last endocrinology f/u was a year ago in [place]. Not currently seeing diabetes doctor.  He is taking Ozempic, Lantus 40u QHS.  No prior cardiac evaluation with cardiology  Of note, privately outside of the room his girlfriend showed me a video of him going from calm to irritable and verbally yelling on the couch to demonstrate his mood swings.   She is concerned about his mood.  Past Medical History:  Diagnosis Date  . Acute ischemic stroke (Brandon [name]) 11/2013  . Allergy   . Anxiety   . Arthritis   . 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 Depression    BH Adm in [place] Depression  . Diabetes mellitus    DKA prior hospitalization  . Diverticulitis    s/p micorperforation Sept 2012-managed conservatively by Gen surgery  . ED (erectile dysfunction)   . Focal seizure (Brandon [name]) 11/2013   due to ischemic stroke  . GERD (gastroesophageal reflux disease)   . Hiatal hernia   . Hyperlipidemia   . Hypertension   . Kidney tumor 09/2011   Renal cell CA  . Seizures (Brandon [name])    none since 2016, taking Keppra - maw  . Sleep apnea   . Thyroid  disease    "weak thyroid" per MD  . Wears glasses    Current Outpatient Medications on File Prior to Visit  Medication Sig Dispense Refill  . atorvastatin (LIPITOR) 40 MG tablet Take 1 tablet (40 mg total) by mouth daily. 90 tablet 0  . blood glucose meter kit and supplies Dispense based on patient and insurance preference. Use up to four times daily as directed. (FOR ICD-9 250.00, 250.01). 1 each 0  . levETIRAcetam (KEPPRA) 500 MG tablet Take 2 tablets (1,000 mg total) by mouth daily. 60 tablet 11   No current facility-administered medications on file prior to visit.    ROS as in subjective   Objective: BP 124/84 (BP Location: Right Arm, Patient Position: Sitting, Cuff Size: Normal)   Pulse 78   Ht 6' (1.829 m)   Wt 261 lb 6.4 oz (118.6 kg)   SpO2 98%   BMI 35.45 kg/m   BP Readings from Last 3 Encounters:  03/15/18 124/84  03/01/18 (!) 154/94  02/06/18 (!) 139/96   Wt Readings from Last 3 Encounters:  03/15/18 261 lb 6.4 oz (118.6 kg)  03/01/18 250 lb 9.6 oz (113.7 kg)  02/02/18 243 lb 2.7 oz (110.3 kg)   General appearance: alert, no distress, WD/WN,  [name]: supple, no lymphadenopathy, no thyromegaly, no masses, no JVD Heart: RRR, normal S1, S2, no murmurs Lungs: CTA bilaterally, no wheezes, rhonchi, or rales  Pulses: 2+ symmetric, upper and lower extremities, normal cap refill Edema: 1+ bilat lower ext and ankle edema    Assessment: Encounter Diagnoses  Name Primary?  . Acute renal failure, unspecified acute renal failure type ([place]) Yes  . Insulin dependent diabetes mellitus (Brandon [name])   . Essential hypertension   . Nausea and vomiting, intractability of vomiting not specified, unspecified vomiting type   . Acute renal failure superimposed on stage 3 chronic kidney disease, unspecified acute renal failure type ([place])   . Uncontrolled other specified diabetes mellitus with hyperglycemia (Brandon [name])   . Weight gain   . Edema, unspecified type      Plan: Reviewed his  recent hospitalization records, discharge summary, labs.   Recommendations: Diabetes  We are referring you to an endocrinologist/diabetes specialist here in [place]  In the meantime continue Ozempic weekly injection  Continue Lantus 40 units in the evening.  You can increase 2 units per week of the Lantus until morning sugars are under 130  High blood pressure and swelling  Continue Amlodipine 53m daily  Begin Lasix 217mdaily fluid pill in the morning  Begin K-dur potassium daily in the morning with the Lasix  Measure your weight daily  If you gain 3 or more pounds in a day, then call or come in  Nausea, vomiting  Restart the Reglan and Miralax given in the hospital  I refilled this  We are referring you back to Gastroenterology, Dr. ArHavery Morosor evaluation of possible gastroparesis  We will call with lab results  I have seen you in the past for mood swings, depression.   I have referred you in the past to psychiatry and counseling on several occasions.  I am not sure why you aren't seeing a mental health provider, or not sure why you haven't continued with treatment recommendations per psychiatry, but you will need to establish care with mental health provider.      RoTakodaas seen today for hospitalization follow-up.  Diagnoses and all orders for this visit:  Acute renal failure, unspecified acute renal failure type ([place]-     Comprehensive metabolic panel -     CBC  Insulin dependent diabetes mellitus (Brandon [name]-     Ambulatory referral to Endocrinology  Essential hypertension  Nausea and vomiting, intractability of vomiting not specified, unspecified vomiting type -     Ambulatory referral to Gastroenterology  Acute renal failure superimposed on stage 3 chronic kidney disease, unspecified acute renal failure type (Brandon [name] Uncontrolled other specified diabetes mellitus with hyperglycemia (Brandon [name]-     Ambulatory referral to Endocrinology  Weight gain -      Comprehensive metabolic panel -     CBC  Edema, unspecified type -     Comprehensive metabolic panel -     CBC  Other orders -     furosemide (LASIX) 20 MG tablet; Take 1 tablet (20 mg total) by mouth daily. -     potassium chloride (K-DUR) 10 MEQ tablet; Take 1 tablet (10 mEq total) by mouth daily. -     amLODipine (NORVASC) 5 MG tablet; Take 1 tablet (5 mg total) by mouth daily. -     aspirin 325 MG tablet; Take 1 tablet (325 mg total) by mouth daily. -     Insulin Glargine (LANTUS SOLOSTAR) 100 UNIT/ML Solostar Pen; Inject 45 Units into the skin daily at 10 pm. -     Insulin Pen Needle (PEN NEEDLES) 32G X 4 MM MISC; 1 each by  Does not apply route daily. Use for insulin pens -     metoCLOPramide (REGLAN) 5 MG tablet; Take 1 tablet (5 mg total) by mouth 3 (three) times daily before meals for 7 days. (follow up with PCP for long term plan for this med) -     polyethylene glycol (MIRALAX / GLYCOLAX) packet; Take 17 g by mouth daily. -     Semaglutide (OZEMPIC) 0.25 or 0.5 MG/DOSE SOPN; Inject 0.25 mg into the skin once a week.

## 2018-03-16 LAB — CBC
Hematocrit: 32.4 % — ABNORMAL LOW (ref 37.5–51.0)
Hemoglobin: 10.9 g/dL — ABNORMAL LOW (ref 13.0–17.7)
MCH: 29.7 pg (ref 26.6–33.0)
MCHC: 33.6 g/dL (ref 31.5–35.7)
MCV: 88 fL (ref 79–97)
Platelets: 241 10*3/uL (ref 150–379)
RBC: 3.67 x10E6/uL — ABNORMAL LOW (ref 4.14–5.80)
RDW: 13.4 % (ref 12.3–15.4)
WBC: 4.9 10*3/uL (ref 3.4–10.8)

## 2018-03-16 LAB — COMPREHENSIVE METABOLIC PANEL
ALBUMIN: 4.2 g/dL (ref 3.5–5.5)
ALK PHOS: 112 IU/L (ref 39–117)
ALT: 13 IU/L (ref 0–44)
AST: 15 IU/L (ref 0–40)
Albumin/Globulin Ratio: 1.4 (ref 1.2–2.2)
BUN / CREAT RATIO: 20 (ref 9–20)
BUN: 28 mg/dL — ABNORMAL HIGH (ref 6–24)
Bilirubin Total: 0.7 mg/dL (ref 0.0–1.2)
CO2: 23 mmol/L (ref 20–29)
CREATININE: 1.38 mg/dL — AB (ref 0.76–1.27)
Calcium: 9.3 mg/dL (ref 8.7–10.2)
Chloride: 105 mmol/L (ref 96–106)
GFR, EST AFRICAN AMERICAN: 67 mL/min/{1.73_m2} (ref >59)
GFR, EST NON AFRICAN AMERICAN: 58 mL/min/{1.73_m2} — AB (ref >59)
GLOBULIN, TOTAL: 2.9 g/dL (ref 1.5–4.5)
GLUCOSE: 139 mg/dL — AB (ref 65–99)
Potassium: 4.4 mmol/L (ref 3.5–5.2)
SODIUM: 144 mmol/L (ref 134–144)
TOTAL PROTEIN: 7.1 g/dL (ref 6.0–8.5)

## 2018-03-22 ENCOUNTER — Encounter: Payer: Self-pay | Admitting: Internal Medicine

## 2018-04-06 ENCOUNTER — Other Ambulatory Visit: Payer: Self-pay | Admitting: Medical

## 2018-04-12 ENCOUNTER — Ambulatory Visit: Payer: Self-pay | Admitting: Medical

## 2018-04-16 ENCOUNTER — Encounter: Payer: Self-pay | Admitting: Endocrinology

## 2018-04-19 ENCOUNTER — Ambulatory Visit: Payer: Medicare Other | Admitting: Medical

## 2018-04-24 ENCOUNTER — Ambulatory Visit (INDEPENDENT_AMBULATORY_CARE_PROVIDER_SITE_OTHER): Payer: Medicare Other | Admitting: Medical

## 2018-04-24 ENCOUNTER — Encounter: Payer: Self-pay | Admitting: Medical

## 2018-04-24 ENCOUNTER — Telehealth: Payer: Self-pay | Admitting: Medical

## 2018-04-24 VITALS — BP 138/92 | HR 79 | Temp 97.9°F | Ht 72.0 in | Wt 269.8 lb

## 2018-04-24 DIAGNOSIS — Z8673 Personal history of transient ischemic attack (TIA), and cerebral infarction without residual deficits: Secondary | ICD-10-CM | POA: Diagnosis not present

## 2018-04-24 DIAGNOSIS — E1169 Type 2 diabetes mellitus with other specified complication: Secondary | ICD-10-CM

## 2018-04-24 DIAGNOSIS — E0865 Diabetes mellitus due to underlying condition with hyperglycemia: Secondary | ICD-10-CM

## 2018-04-24 DIAGNOSIS — E785 Hyperlipidemia, unspecified: Secondary | ICD-10-CM

## 2018-04-24 DIAGNOSIS — I10 Essential (primary) hypertension: Secondary | ICD-10-CM | POA: Diagnosis not present

## 2018-04-24 DIAGNOSIS — G4733 Obstructive sleep apnea (adult) (pediatric): Secondary | ICD-10-CM

## 2018-04-24 DIAGNOSIS — Z85528 Personal history of other malignant neoplasm of kidney: Secondary | ICD-10-CM

## 2018-04-24 DIAGNOSIS — N529 Male erectile dysfunction, unspecified: Secondary | ICD-10-CM | POA: Diagnosis not present

## 2018-04-24 DIAGNOSIS — R609 Edema, unspecified: Secondary | ICD-10-CM | POA: Diagnosis not present

## 2018-04-24 DIAGNOSIS — D649 Anemia, unspecified: Secondary | ICD-10-CM | POA: Diagnosis not present

## 2018-04-24 DIAGNOSIS — F339 Major depressive disorder, recurrent, unspecified: Secondary | ICD-10-CM | POA: Diagnosis not present

## 2018-04-24 MED ORDER — ATORVASTATIN CALCIUM 40 MG PO TABS: 40.0000 mg | ORAL_TABLET | Freq: Every day | ORAL | 3 refills | Status: DC

## 2018-04-24 MED ORDER — AMLODIPINE BESYLATE 10 MG PO TABS: 10.0000 mg | ORAL_TABLET | Freq: Every day | ORAL | 3 refills | Status: DC

## 2018-04-24 MED ORDER — SEMAGLUTIDE(0.25 OR 0.5MG/DOS) 2 MG/1.5ML ~~LOC~~ SOPN: 0.2500 mg | PEN_INJECTOR | SUBCUTANEOUS | 2 refills | Status: DC

## 2018-04-24 MED ORDER — POTASSIUM CHLORIDE ER 10 MEQ PO TBCR: 10.0000 meq | EXTENDED_RELEASE_TABLET | Freq: Every day | ORAL | 1 refills | Status: DC

## 2018-04-24 MED ORDER — FUROSEMIDE 20 MG PO TABS: 20.0000 mg | ORAL_TABLET | Freq: Every day | ORAL | 1 refills | Status: DC

## 2018-04-24 NOTE — Telephone Encounter (Signed)
Refer to cardiology for blood pressure concerns, updated echocardiogram and concern for ED medication

## 2018-04-24 NOTE — Progress Notes (Signed)
Subjective: Chief Complaint  Patient presents with  . Follow-up   Here with his girlfriend for f/u  He sees GI Dr. Havery Moros this week for f/u on gastroparesis and nausea.  Diabetes  He has seen recently improved glucose numbers.   He is compliant Ozempic weekly, Lantus up to 45u now, trying to eat healthy.   Cut his left great toe with toenail clippers.  Was seeing podiatry in [facility] but the doctor moved and they haven't had a chance to get back in there.    High blood pressure and swelling - compliant with Amlodipine 32m daily, he started Lasix 245mdaily from last visit, started potassium.  Regarding depression and recommendation last visit and several prior visits to get appt with psychiatry, he said he called [facility] and they were "full" and told to call back at a later time for appt.  He requests viagra or similar to help with longstanding ED   Past Medical History:  Diagnosis Date  . Acute ischemic stroke (HCI[place]12/2014  . Allergy   . Anxiety   . Arthritis   . 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 [place]    [place]  . Diabetes mellitus    DKA prior hospitalization  . Diverticulitis    s/p micorperforation Sept 2012-managed conservatively by Gen surgery  . ED (erectile dysfunction)   . Focal seizure (HCC[place]12/2014   due to ischemic stroke  . GERD (gastroesophageal reflux disease)   . Hiatal hernia   . Hyperlipidemia   . Hypertension   . Kidney tumor 09/2011   Renal cell CA  . Seizures ([place]   none since 2016, taking Keppra - maw  . Sleep apnea   . Thyroid disease    "weak thyroid" per MD  . Wears glasses    Current Outpatient Medications on File Prior to Visit  Medication Sig Dispense Refill  . aspirin 325 MG tablet Take 1 tablet (325 mg total) by mouth daily. 90 tablet 3  . blood glucose meter kit and supplies Dispense based on patient and insurance preference. Use up to four times daily  as directed. (FOR ICD-9 250.00, 250.01). 1 each 0  . Insulin Glargine (LANTUS SOLOSTAR) 100 UNIT/ML Solostar Pen Inject 45 Units into the skin daily at 10 pm. 15 pen 2  . Insulin Pen Needle (PEN NEEDLES) 32G X 4 MM MISC 1 each by Does not apply route daily. Use for insulin pens 200 each 2  . levETIRAcetam (KEPPRA) 500 MG tablet Take 2 tablets (1,000 mg total) by mouth daily. 60 tablet 11  . polyethylene glycol (MIRALAX / GLYCOLAX) packet Take 17 g by mouth daily. 30 each 0  . insulin aspart protamine- aspart (NOVOLOG MIX 70/30) (70-30) 100 UNIT/ML injection Inject into the skin.    . [place] KitchenetoCLOPramide (REGLAN) 5 MG tablet Take 1 tablet (5 mg total) by mouth 3 (three) times daily before meals for 7 days. (follow up with PCP for long term plan for this med) 45 tablet 0   No current facility-administered medications on file prior to visit.    ROS as in subjective  Objective: BP (!) 138/92   Pulse 79   Temp 97.9 F (36.6 C) (Oral)   Ht 6' (1.829 m)   Wt 269 lb 12.8 oz (122.4 kg)   SpO2 97%   BMI 36.59 kg/m    Gen: wd, wn, nad Skin: left distal with 2  small linear 7-68m superficial wounds, but no surrounding erythema, fluctuance, pus or warmth There are several thickened toenails throughout 1+ pedal pluses    Assessment: Encounter Diagnoses  Name Primary?  . Essential hypertension Yes  . Dyslipidemia associated with type 2 diabetes mellitus ([place]   . Edema, unspecified type   . Anemia, unspecified type   . Obstructive sleep apnea   . Diabetes mellitus due to underlying condition, uncontrolled, with hyperglycemia ([place]   . History of stroke   . Depression, recurrent ([place]   . History of renal cell cancer   . Erectile dysfunction, unspecified erectile dysfunction type      Plan: Labs as below  Referral to cardiology regarding uncontrolled BP, ED concerns given his multiple risk factors, and lack of good prior control of diabetes and BP.    C/t current medications but  increase to Amlodipine 147mdaily  He was on ACE/ARB prior but given acute renal failure in 01/2018, the ACE/ARB was discontinued.   Toenail hypertrophy and small break in the skin of right great toe from toenail clippers, advised good foot hygiene, topical neosporin OTC, and plan to see podiatry.    Referrals needed include cardiology, nephrology, podiatry.  However, his compliance and follow up can be less than ideal from time to time.  Thus we will address one issue of referral at a time to get better compliance and follow for his benefit.     Refer to cardiology.   Once he has this set up we can pursue referral to podiatry next.   [facility] since [facility] take walk ins.     ED - deferred treatment until after cardiology eval   Brandon [name] seen today for follow-up.  Diagnoses and all orders for this visit:  Essential hypertension -     Renal Function Panel -     Ambulatory referral to Cardiology  Dyslipidemia associated with type 2 diabetes mellitus (HCEva-     Ambulatory referral to Cardiology  Edema, unspecified type -     Renal Function Panel  Anemia, unspecified type -     Iron and TIBC -     CBC  Obstructive sleep apnea  Diabetes mellitus due to underlying condition, uncontrolled, with hyperglycemia (HCC)  History of stroke  Depression, recurrent (HCC)  History of renal cell cancer  Erectile dysfunction, unspecified erectile dysfunction type  Other orders -     atorvastatin (LIPITOR) 40 MG tablet; Take 1 tablet (40 mg total) by mouth daily. -     furosemide (LASIX) 20 MG tablet; Take 1 tablet (20 mg total) by mouth daily. -     potassium chloride (K-DUR) 10 MEQ tablet; Take 1 tablet (10 mEq total) by mouth daily. -     Semaglutide (OZEMPIC) 0.25 or 0.5 MG/DOSE SOPN; Inject 0.25 mg into the skin once a week. -     amLODipine (NORVASC) 10 MG tablet; Take 1 tablet (10 mg total) by mouth daily.

## 2018-04-25 LAB — CBC
HEMATOCRIT: 36.3 % — AB (ref 37.5–51.0)
HEMOGLOBIN: 12.3 g/dL — AB (ref 13.0–17.7)
MCH: 30 pg (ref 26.6–33.0)
MCHC: 33.9 g/dL (ref 31.5–35.7)
MCV: 89 fL (ref 79–97)
Platelets: 262 10*3/uL (ref 150–450)
RBC: 4.1 x10E6/uL — ABNORMAL LOW (ref 4.14–5.80)
RDW: 14 % (ref 12.3–15.4)
WBC: 5.5 10*3/uL (ref 3.4–10.8)

## 2018-04-25 LAB — IRON AND TIBC
IRON SATURATION: 32 % (ref 15–55)
Iron: 87 ug/dL (ref 38–169)
TIBC: 275 ug/dL (ref 250–450)
UIBC: 188 ug/dL (ref 111–343)

## 2018-04-25 LAB — RENAL FUNCTION PANEL
Albumin: 4.3 g/dL (ref 3.5–5.5)
BUN / CREAT RATIO: 19 (ref 9–20)
BUN: 27 mg/dL — AB (ref 6–24)
CO2: 24 mmol/L (ref 20–29)
CREATININE: 1.39 mg/dL — AB (ref 0.76–1.27)
Calcium: 9.9 mg/dL (ref 8.7–10.2)
Chloride: 103 mmol/L (ref 96–106)
GFR, EST AFRICAN AMERICAN: 65 mL/min/{1.73_m2} (ref >59)
GFR, EST NON AFRICAN AMERICAN: 57 mL/min/{1.73_m2} — AB (ref >59)
Glucose: 65 mg/dL (ref 65–99)
Phosphorus: 4 mg/dL (ref 2.5–4.5)
Potassium: 4.3 mmol/L (ref 3.5–5.2)
Sodium: 144 mmol/L (ref 134–144)

## 2018-04-25 NOTE — Telephone Encounter (Signed)
Pt has been referred to Cardiology

## 2018-04-26 ENCOUNTER — Ambulatory Visit (INDEPENDENT_AMBULATORY_CARE_PROVIDER_SITE_OTHER): Payer: Medicare Other | Admitting: Gastroenterology

## 2018-04-26 ENCOUNTER — Encounter: Payer: Self-pay | Admitting: Gastroenterology

## 2018-04-26 VITALS — BP 128/82 | HR 85 | Ht 72.25 in | Wt 265.0 lb

## 2018-04-26 DIAGNOSIS — K5900 Constipation, unspecified: Secondary | ICD-10-CM | POA: Diagnosis not present

## 2018-04-26 DIAGNOSIS — R1032 Left lower quadrant pain: Secondary | ICD-10-CM | POA: Diagnosis not present

## 2018-04-26 DIAGNOSIS — R110 Nausea: Secondary | ICD-10-CM

## 2018-04-26 MED ORDER — LINACLOTIDE 145 MCG PO CAPS: 145.0000 ug | ORAL_CAPSULE | Freq: Every day | ORAL | 0 refills | Status: DC

## 2018-04-26 MED ORDER — ONDANSETRON HCL 4 MG PO TABS: 4.0000 mg | ORAL_TABLET | Freq: Three times a day (TID) | ORAL | 1 refills | Status: DC | PRN

## 2018-04-26 MED ORDER — GABAPENTIN 300 MG PO CAPS: 300.0000 mg | ORAL_CAPSULE | Freq: Every day | ORAL | 1 refills | Status: DC

## 2018-04-26 NOTE — Patient Instructions (Addendum)
If you are age 55 or older, your body mass index should be between 23-30. Your Body mass index is 35.69 kg/m. If this is out of the aforementioned range listed, please consider follow up with your Primary Care Provider.  If you are age 55 or younger, your body mass index should be between 19-25. Your Body mass index is 35.69 kg/m. If this is out of the aformentioned range listed, please consider follow up with your Primary Care Provider.   We have sent the following medications to your pharmacy for you to pick up at your convenience: Zofran 4 mg: Take every 6 to 8 hours as needed  Gabapentin 300 mg: Take 1 tablet every night at bedtime for 2 weeks, Then take 1 tablet twice a day as needed  We are giving you samples of Linzess 145 mcg today. Take once a day.  If this is helpful let us know and we can send in a prescription for you.    Please call us in 2 weeks if your symptoms have not improved.   Thank you for entrusting me with your care and for choosing [facility], Dr. Thomson Cellar

## 2018-04-26 NOTE — Progress Notes (Signed)
HPI :  57 year old male with a history of renal cell carcinoma status post ablative therapy by interventional radiology, history of multiple colonoscopies due to poor preparation, history of diverticulitis in 2018, here for a follow-up visit.  He's been having left lower quadrant pain for the past few months. In March she was admitted with intolerance of by mouth associated with worsening pain. He has CT scan at that time which did not show any evidence of diverticulitis. Since that time he reports having persistent pain in his left lower quadrant that radiates to his left flank he states were he thinks kidney is. He rates it 5 and 10 all the time, sometimes it can get up to 7 out of 10. He has had some worsening of pain after eating at times. He is not having anyvomiting but does have occasional nausea. He denies any reflux symptoms. He is not using any NSAIDs. No dysphagia.  Is otherwise having worsening constipation. He was using MiraLAX every day which she states hasn't helped at all. He is using castor oil every few days which thinks helps make her have a bowel movement. He is not sure if having a bowel movement will help relieve this pain. He is tender in the left lower quadrant. His last colonoscopy after multiple preps was normal in July 2018 without polyps, only remarkable finding was diverticulosis of the left colon. He has had an ultrasound of his right upper quadrant in March which showed some sludge but no stones. He denies any right upper quadrant pain at this time.  Colonoscopy 06/15/2017 - diverticulosis of left colon, internal hemorrhoids, no polyps, good prep  CT abdomen / pelvis 02/05/2018 - diverticulosis, renal carcinomas s/p ablation -- stable  RUQ [place] - 02/05/2018 - sludge noted, no stones  Past Medical History:  Diagnosis Date  . Acute ischemic stroke ([place]) 11/2013  . Allergy   . Anxiety   . Arthritis   . Bil Renal Ca dx'd 09/2011 & 11/2011   left and right; cryoablation bil   . Depression    BH Adm in [place] Depression  . Diabetes mellitus    DKA prior hospitalization  . Diverticulitis    s/p micorperforation Sept 2012-managed conservatively by Gen surgery  . ED (erectile dysfunction)   . Focal seizure ([place]) 11/2013   due to ischemic stroke  . GERD (gastroesophageal reflux disease)   . Hiatal hernia   . Hyperlipidemia   . Hypertension   . Kidney tumor 09/2011   Renal cell CA  . Seizures ([place])    none since 2016, taking Keppra - maw  . Sleep apnea   . Thyroid disease    "weak thyroid" per MD  . Wears glasses      Past Surgical History:  Procedure Laterality Date  . COLONOSCOPY    . IR RADIOLOGIST EVAL & MGMT  04/04/2017  . KIDNEY SURGERY     ablation of renal cell CA - 12/28, prior one was October 2012-Dr. Kathlene Cote  . TEE WITHOUT CARDIOVERSION N/A 11/19/2013   Procedure: TRANSESOPHAGEAL ECHOCARDIOGRAM (TEE);  Surgeon: Lelon Perla, MD;  Location: [facility] ENDOSCOPY;  Service: Cardiovascular;  Laterality: N/A;   Family History  Problem Relation Age of Onset  . Diabetes Father   . Prostate cancer Other   . Stomach cancer Other   . Colon cancer Neg Hx   . Esophageal cancer Neg Hx   . Rectal cancer Neg Hx    Social History   Tobacco Use  .  Smoking status: Never Smoker  . Smokeless tobacco: Never Used  Substance Use Topics  . Alcohol use: Yes    Comment: rarely  . Drug use: No   Current Outpatient Medications  Medication Sig Dispense Refill  . amLODipine (NORVASC) 10 MG tablet Take 1 tablet (10 mg total) by mouth daily. 90 tablet 3  . aspirin 325 MG tablet Take 1 tablet (325 mg total) by mouth daily. 90 tablet 3  . atorvastatin (LIPITOR) 40 MG tablet Take 1 tablet (40 mg total) by mouth daily. 90 tablet 3  . blood glucose meter kit and supplies Dispense based on patient and insurance preference. Use up to four times daily as directed. (FOR ICD-9 250.00, 250.01). 1 each 0  . furosemide (LASIX) 20 MG tablet Take 1 tablet (20 mg total) by  mouth daily. 30 tablet 1  . insulin aspart protamine- aspart (NOVOLOG MIX 70/30) (70-30) 100 UNIT/ML injection Inject into the skin.    . Insulin Glargine (LANTUS SOLOSTAR) 100 UNIT/ML Solostar Pen Inject 45 Units into the skin daily at 10 pm. 15 pen 2  . Insulin Pen Needle (PEN NEEDLES) 32G X 4 MM MISC 1 each by Does not apply route daily. Use for insulin pens 200 each 2  . levETIRAcetam (KEPPRA) 500 MG tablet Take 2 tablets (1,000 mg total) by mouth daily. 60 tablet 11  . polyethylene glycol (MIRALAX / GLYCOLAX) packet Take 17 g by mouth daily. 30 each 0  . potassium chloride (K-DUR) 10 MEQ tablet Take 1 tablet (10 mEq total) by mouth daily. 30 tablet 1  . Semaglutide (OZEMPIC) 0.25 or 0.5 MG/DOSE SOPN Inject 0.25 mg into the skin once a week. 1.5 mL 2   No current facility-administered medications for this visit.    Allergies  Allergen Reactions  . Morphine Itching     Review of Systems: All systems reviewed and negative except where noted in HPI.   Lab Results  Component Value Date   WBC 5.5 04/24/2018   HGB 12.3 (L) 04/24/2018   HCT 36.3 (L) 04/24/2018   MCV 89 04/24/2018   PLT 262 04/24/2018    Lab Results  Component Value Date   CREATININE 1.39 (H) 04/24/2018   BUN 27 (H) 04/24/2018   NA 144 04/24/2018   K 4.3 04/24/2018   CL 103 04/24/2018   CO2 24 04/24/2018    Lab Results  Component Value Date   ALT 13 03/15/2018   AST 15 03/15/2018   ALKPHOS 112 03/15/2018   BILITOT 0.7 03/15/2018     Physical Exam: BP 128/82   Pulse 85   Ht 6' 0.25" (1.835 m)   Wt 265 lb (120.2 kg)   BMI 35.69 kg/m  Constitutional: Pleasant,well-developed, male in no acute distress. HEENT: Normocephalic and atraumatic. Conjunctivae are normal. No scleral icterus. Neck supple.  Cardiovascular: Normal rate, regular rhythm.  Pulmonary/chest: Effort normal and breath sounds normal. No wheezing, rales or rhonchi. Abdominal: Soft, nondistended, tednerness in the LLQ into the Left  flank, positive carnett sign. There are no masses palpable. No hepatomegaly. Extremities: no edema Lymphadenopathy: No cervical adenopathy noted. Neurological: Alert and oriented to person place and time. Skin: Skin is warm and dry. No rashes noted. Psychiatric: Normal mood and affect. Behavior is normal.   ASSESSMENT AND PLAN: 57 year old male with a history of renal cell carcinoma status post ablative therapy with interventional radiology, presenting with persistent left lower quadrant pain and worsening constipation.  Well he has a prior history of diverticulitis he  has not had diverticulitis on imaging the setting of this pain. His pain is constant and I can reproduce it fairly easily on exam. I suspect he may have neuropathic or musculoskeletal pain, unclear if related to prior ablative therapy to his renal cell carcinoma, this radiates to his left flank. Constipation could also be contributing however he does not endorse much pain relief after bowel movement. We'll try him on gabapentin 300 mg daily at bedtime and increase to twice daily after 2 weeks if this helps. Otherwise he'll stop MiraLAX and I'll give him a trial of Linzess 149mg / daily to treat constipation. I will also give him some Zofran to use as needed for his nausea see if this helps. May consider trial of Bentyl but his exam is more consistent with musculoskeletal/neuropathic at this time. I reassured him prior CT scan findings do not show any clear cause for his pain. I asked him to contact me in a few weeks to see if he is doing, if no better we'll consider other options.  SCarolina Cellar MD [facility]

## 2018-05-09 ENCOUNTER — Encounter: Payer: Self-pay | Admitting: Internal Medicine

## 2018-05-16 ENCOUNTER — Telehealth: Payer: Self-pay

## 2018-05-16 ENCOUNTER — Other Ambulatory Visit: Payer: Self-pay | Admitting: Medical

## 2018-05-16 NOTE — Telephone Encounter (Signed)
Called pt to check to see if he was out of Potassium and furosemide. Have a refill request but looks like med was sent in 04-24-18 and a refill was attached. No answer LVM KH

## 2018-05-16 NOTE — Telephone Encounter (Signed)
Waiting on call back from pt . [place]

## 2018-05-16 NOTE — Telephone Encounter (Signed)
Pt called back stating that he di not contact [facility] on both of the meds in question however he does need a refill on Lasix in a couple of days. Advised pt that [facility] should be at [facility]. The [facility]ill [facility] was intiated from [facility].

## 2018-05-18 ENCOUNTER — Other Ambulatory Visit: Payer: Self-pay

## 2018-05-18 MED ORDER — POTASSIUM CHLORIDE ER 10 MEQ PO TBCR: 10.0000 meq | EXTENDED_RELEASE_TABLET | Freq: Every day | ORAL | 0 refills | Status: DC

## 2018-05-18 MED ORDER — FUROSEMIDE 20 MG PO TABS: 20.0000 mg | ORAL_TABLET | Freq: Every day | ORAL | 0 refills | Status: DC

## 2018-05-18 NOTE — Telephone Encounter (Signed)
Called pt to let him know his med was sent in 30 of med until appt. No answer lVM kh

## 2018-05-23 ENCOUNTER — Telehealth: Payer: Self-pay

## 2018-05-23 NOTE — Telephone Encounter (Signed)
Prior auth submitted 04-27-18. PA Case: [id number], Status: Approved, Coverage Starts on: 04/27/2018 12:00:00 AM, Coverage Ends on: 04/26/2118 12:00:00 AM.

## 2018-05-24 ENCOUNTER — Ambulatory Visit: Payer: Medicare Other | Admitting: Medical

## 2018-05-30 ENCOUNTER — Ambulatory Visit: Payer: Medicare Other | Admitting: Cardiovascular Disease

## 2018-06-05 ENCOUNTER — Telehealth: Payer: Self-pay | Admitting: Medical

## 2018-06-05 ENCOUNTER — Other Ambulatory Visit: Payer: Self-pay | Admitting: Medical

## 2018-06-05 NOTE — Telephone Encounter (Signed)
Left message on voicemail for patient to call back. 

## 2018-06-05 NOTE — Telephone Encounter (Signed)
Patient called back and said he did not have an appointment here on the 05-24-18 and he had no idea that he had to come back for any med check for Diabetes medications.  I asked patient to schedule appointment with me and he said he would call back.

## 2018-06-05 NOTE — Telephone Encounter (Signed)
Please call him back.     After last visit there were several things we discussed and recommended.  1- we referred to cardiology.  I don't know where he is in terms of follow up.  Has he seen the cardiologist?  If not, when does he have appt scheduled? 2- last visit we increased Amlodipine to 10mg  daily.  Is he taking this? 3- last visit I asked him to establish care at [facility].   Is he seeing them, if not, whey not? 4- finally he is due back now for recheck on diabetes since he quit going to endocrinology.  So please schedule f/u.

## 2018-06-05 NOTE — Telephone Encounter (Signed)
Is this ok to refill?  I called patient and left message for him to call back to make an appointment for med check.

## 2018-06-06 NOTE — Telephone Encounter (Signed)
Left message on voicemail for patient to call back. 

## 2018-06-15 ENCOUNTER — Other Ambulatory Visit: Payer: Self-pay | Admitting: Medical

## 2018-08-24 ENCOUNTER — Telehealth: Payer: Self-pay | Admitting: Medical

## 2018-08-24 NOTE — Telephone Encounter (Signed)
   He is in another area at this time and does note no how long he will be there, may be permanent. Wants to know if you can refer him to primary care physician in [facility]   Please call

## 2018-08-24 NOTE — Telephone Encounter (Signed)
I don't know anybody specific there, but if he has an office in mind, and if needs referral we can do this

## 2018-08-27 NOTE — Telephone Encounter (Signed)
Returned pt call, number no longer in service

## 2018-08-28 ENCOUNTER — Other Ambulatory Visit: Payer: Self-pay | Admitting: Medical

## 2018-08-28 NOTE — Telephone Encounter (Signed)
Is this ok to refill?  

## 2018-09-05 ENCOUNTER — Other Ambulatory Visit: Payer: Self-pay | Admitting: Medical

## 2018-09-05 NOTE — Telephone Encounter (Signed)
Is this ok to refill?  

## 2018-09-11 ENCOUNTER — Other Ambulatory Visit: Payer: Self-pay | Admitting: Medical

## 2018-09-11 NOTE — Telephone Encounter (Signed)
Is this ok to refill?  

## 2018-09-22 DIAGNOSIS — S12590A Other displaced fracture of sixth cervical vertebra, initial encounter for closed fracture: Secondary | ICD-10-CM | POA: Diagnosis not present

## 2018-09-22 DIAGNOSIS — Z794 Long term (current) use of insulin: Secondary | ICD-10-CM | POA: Diagnosis not present

## 2018-09-22 DIAGNOSIS — S12501K Unspecified nondisplaced fracture of sixth cervical vertebra, subsequent encounter for fracture with nonunion: Secondary | ICD-10-CM | POA: Diagnosis not present

## 2018-09-22 DIAGNOSIS — R0602 Shortness of breath: Secondary | ICD-10-CM | POA: Diagnosis not present

## 2018-09-22 DIAGNOSIS — R4182 Altered mental status, unspecified: Secondary | ICD-10-CM | POA: Diagnosis not present

## 2018-09-22 DIAGNOSIS — I10 Essential (primary) hypertension: Secondary | ICD-10-CM | POA: Diagnosis not present

## 2018-09-22 DIAGNOSIS — N179 Acute kidney failure, unspecified: Secondary | ICD-10-CM | POA: Diagnosis not present

## 2018-09-22 DIAGNOSIS — Z743 Need for continuous supervision: Secondary | ICD-10-CM | POA: Diagnosis not present

## 2018-09-22 DIAGNOSIS — R0689 Other abnormalities of breathing: Secondary | ICD-10-CM | POA: Diagnosis not present

## 2018-09-22 DIAGNOSIS — G9389 Other specified disorders of brain: Secondary | ICD-10-CM | POA: Diagnosis not present

## 2018-09-22 DIAGNOSIS — Z7982 Long term (current) use of aspirin: Secondary | ICD-10-CM | POA: Diagnosis not present

## 2018-09-22 DIAGNOSIS — R569 Unspecified convulsions: Secondary | ICD-10-CM | POA: Diagnosis not present

## 2018-09-22 DIAGNOSIS — M459 Ankylosing spondylitis of unspecified sites in spine: Secondary | ICD-10-CM | POA: Diagnosis present

## 2018-09-22 DIAGNOSIS — M8448XA Pathological fracture, other site, initial encounter for fracture: Secondary | ICD-10-CM | POA: Diagnosis not present

## 2018-09-22 DIAGNOSIS — G40909 Epilepsy, unspecified, not intractable, without status epilepticus: Secondary | ICD-10-CM | POA: Diagnosis present

## 2018-09-22 DIAGNOSIS — T383X1A Poisoning by insulin and oral hypoglycemic [antidiabetic] drugs, accidental (unintentional), initial encounter: Secondary | ICD-10-CM | POA: Diagnosis not present

## 2018-09-22 DIAGNOSIS — E162 Hypoglycemia, unspecified: Secondary | ICD-10-CM | POA: Diagnosis not present

## 2018-09-22 DIAGNOSIS — E876 Hypokalemia: Secondary | ICD-10-CM | POA: Diagnosis present

## 2018-09-22 DIAGNOSIS — E1165 Type 2 diabetes mellitus with hyperglycemia: Secondary | ICD-10-CM | POA: Diagnosis not present

## 2018-09-22 DIAGNOSIS — M542 Cervicalgia: Secondary | ICD-10-CM | POA: Diagnosis not present

## 2018-09-22 DIAGNOSIS — E11649 Type 2 diabetes mellitus with hypoglycemia without coma: Secondary | ICD-10-CM | POA: Diagnosis not present

## 2018-09-22 DIAGNOSIS — I69354 Hemiplegia and hemiparesis following cerebral infarction affecting left non-dominant side: Secondary | ICD-10-CM | POA: Diagnosis not present

## 2018-09-22 DIAGNOSIS — R0989 Other specified symptoms and signs involving the circulatory and respiratory systems: Secondary | ICD-10-CM | POA: Diagnosis not present

## 2018-09-22 DIAGNOSIS — T40601A Poisoning by unspecified narcotics, accidental (unintentional), initial encounter: Secondary | ICD-10-CM | POA: Diagnosis not present

## 2019-01-09 ENCOUNTER — Encounter (HOSPITAL_COMMUNITY): Payer: Self-pay

## 2019-01-09 ENCOUNTER — Emergency Department (HOSPITAL_COMMUNITY)
Admission: EM | Admit: 2019-01-09 | Discharge: 2019-01-10 | Disposition: A | Discharge status: Home / Self Care | Payer: Medicare Other | Attending: Emergency Medicine | Admitting: Emergency Medicine

## 2019-01-09 ENCOUNTER — Other Ambulatory Visit: Payer: Self-pay

## 2019-01-09 DIAGNOSIS — T383X5A Adverse effect of insulin and oral hypoglycemic [antidiabetic] drugs, initial encounter: Secondary | ICD-10-CM | POA: Diagnosis not present

## 2019-01-09 DIAGNOSIS — E11649 Type 2 diabetes mellitus with hypoglycemia without coma: Secondary | ICD-10-CM | POA: Insufficient documentation

## 2019-01-09 DIAGNOSIS — E160 Drug-induced hypoglycemia without coma: Secondary | ICD-10-CM | POA: Diagnosis not present

## 2019-01-09 DIAGNOSIS — Z79899 Other long term (current) drug therapy: Secondary | ICD-10-CM | POA: Diagnosis not present

## 2019-01-09 DIAGNOSIS — R4020 Unspecified coma: Secondary | ICD-10-CM | POA: Diagnosis not present

## 2019-01-09 DIAGNOSIS — Z7982 Long term (current) use of aspirin: Secondary | ICD-10-CM | POA: Diagnosis not present

## 2019-01-09 DIAGNOSIS — Z8673 Personal history of transient ischemic attack (TIA), and cerebral infarction without residual deficits: Secondary | ICD-10-CM | POA: Insufficient documentation

## 2019-01-09 DIAGNOSIS — E161 Other hypoglycemia: Secondary | ICD-10-CM | POA: Diagnosis not present

## 2019-01-09 DIAGNOSIS — R404 Transient alteration of awareness: Secondary | ICD-10-CM | POA: Diagnosis not present

## 2019-01-09 DIAGNOSIS — R569 Unspecified convulsions: Secondary | ICD-10-CM | POA: Insufficient documentation

## 2019-01-09 DIAGNOSIS — I10 Essential (primary) hypertension: Secondary | ICD-10-CM | POA: Diagnosis not present

## 2019-01-09 DIAGNOSIS — E162 Hypoglycemia, unspecified: Secondary | ICD-10-CM | POA: Diagnosis not present

## 2019-01-09 DIAGNOSIS — R410 Disorientation, unspecified: Secondary | ICD-10-CM | POA: Diagnosis not present

## 2019-01-09 LAB — CBG MONITORING, ED: Glucose-Capillary: 103 mg/dL — ABNORMAL HIGH (ref 70–99)

## 2019-01-09 NOTE — ED Provider Notes (Signed)
[facility] DEPT Provider Note: Georgena Spurling, MD, FACEP  CSN: [id number] MRN: [id number] ARRIVAL: 01/09/19 at [place]: WA20/WA20   CHIEF COMPLAINT  Hypoglycemia   HISTORY OF PRESENT ILLNESS  01/09/19 11:43 PM Brandon [name] is a 58 y.o. male with a history of a stroke which causes him to be confused at times.  He reportedly administered too much insulin earlier this evening.  EMS found him unresponsive with a CBG of 28.  They administered 25 g of D10 which brought a CBG up to 111 but it then dropped to 60 though the patient remained alert.  They administered another 20 g of D10 which maintain a sugar of 86.  On arrival his sugar was noted to be 103.  He was also given 4 mg of Zofran prior to arrival because he was vomiting after awakening from his unresponsiveness.  Vital signs were normal.  The patient's only acute complaint is a feeling of numbness and discomfort in his left leg, the side affected by his previous stroke.   Past Medical History:  Diagnosis Date  . Acute ischemic stroke ([place]) 11/2013  . Allergy   . Anxiety   . Arthritis   . 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 Depression    BH Adm in [place] Depression  . Diabetes mellitus    DKA prior hospitalization  . Diverticulitis    s/p micorperforation Sept 2012-managed conservatively by Gen surgery  . ED (erectile dysfunction)   . Focal seizure ([place]) 11/2013   due to ischemic stroke  . GERD (gastroesophageal reflux disease)   . Hiatal hernia   . Hyperlipidemia   . Hypertension   . Kidney tumor 09/2011   Renal cell CA  . Seizures ([place])    none since 2016, taking Keppra - maw  . Sleep apnea   . Thyroid disease    "weak thyroid" per MD  . Wears glasses     Past Surgical History:  Procedure Laterality Date  . COLONOSCOPY    . IR RADIOLOGIST EVAL & MGMT  04/04/2017  . KIDNEY SURGERY     ablation of renal cell CA - 12/28, prior one was October 2012-Dr. Kathlene Cote  .  TEE WITHOUT CARDIOVERSION N/A 11/19/2013   Procedure: TRANSESOPHAGEAL ECHOCARDIOGRAM (TEE);  Surgeon: Lelon Perla, MD;  Location: [facility] ENDOSCOPY;  Service: Cardiovascular;  Laterality: N/A;    Family History  Problem Relation Age of Onset  . Diabetes Father   . Prostate cancer Other   . Stomach cancer Other   . Colon cancer Neg Hx   . Esophageal cancer Neg Hx   . Rectal cancer Neg Hx     Social History   Tobacco Use  . Smoking status: Never Smoker  . Smokeless tobacco: Never Used  Substance Use Topics  . Alcohol use: Not Currently    Comment: rarely  . Drug use: No    Prior to Admission medications   Medication Sig Start Date End Date Taking? Authorizing Provider  amLODipine (NORVASC) 10 MG tablet Take 1 tablet (10 mg total) by mouth daily. 04/24/18  Yes Tysinger, Camelia Eng, PA-C  aspirin 325 MG tablet Take 1 tablet (325 mg total) by mouth daily. 03/15/18  Yes Tysinger, Camelia Eng, PA-C  atorvastatin (LIPITOR) 40 MG tablet Take 1 tablet (40 mg total) by mouth daily. 04/24/18  Yes Tysinger, Camelia Eng, PA-C  furosemide (LASIX) 20 MG tablet TAKE 1 TABLET BY MOUTH EVERY DAY  Patient taking differently: Take 20 mg by mouth daily.  09/11/18  Yes Tysinger, Camelia Eng, PA-C  gabapentin (NEURONTIN) 300 MG capsule Take 1 capsule (300 mg total) by mouth at bedtime. For 2 weeks. Then take 300 mg by mouth twice a day thereafter as needed Patient taking differently: Take 300 mg by mouth daily as needed (pain).  04/26/18  Yes Armbruster, Carlota Raspberry, MD  insulin aspart protamine- aspart (NOVOLOG MIX 70/30) (70-30) 100 UNIT/ML injection Inject 0-15 Units into the skin daily. Sliding scale 12/17/14  Yes [provider]  LANTUS SOLOSTAR 100 UNIT/ML Solostar Pen INJECT 55 UNITS INTO THE SKIN DAILY AT 10 PM. Patient taking differently: Inject 55 Units into the skin daily.  09/06/18  Yes Tysinger, Camelia Eng, PA-C  levETIRAcetam (KEPPRA) 500 MG tablet Take 2 tablets (1,000 mg total) by mouth daily. 03/05/18  Yes  Dennie Bible, NP  linaclotide [facility]) 145 MCG CAPS capsule Take 1 capsule (145 mcg total) by mouth daily before breakfast. 04/26/18  Yes Armbruster, Carlota Raspberry, MD  QUEtiapine (SEROQUEL) 25 MG tablet Take 25 mg by mouth daily. 01/09/19  Yes [provider]  Semaglutide (OZEMPIC) 0.25 or 0.5 MG/DOSE SOPN Inject 0.25 mg into the skin once a week. 04/24/18  Yes Tysinger, Camelia Eng, PA-C  sertraline (ZOLOFT) 100 MG tablet Take 100 mg by mouth daily. 01/02/19  Yes [provider]  blood glucose meter kit and supplies Dispense based on patient and insurance preference. Use up to four times daily as directed. (FOR ICD-9 250.00, 250.01). 04/24/16   Eugenie Filler, MD  Insulin Pen Needle (PEN NEEDLES) 32G X 4 MM MISC 1 each by Does not apply route daily. Use for insulin pens 03/15/18   Tysinger, Camelia Eng, PA-C  ondansetron (ZOFRAN) 4 MG tablet Take 1 tablet (4 mg total) by mouth every 8 (eight) hours as needed for nausea or vomiting. Patient not taking: Reported on 01/10/2019 04/26/18   Yetta Flock, MD  polyethylene glycol [facility] / GLYCOLAX) packet Take 17 g by mouth daily. Patient not taking: Reported on 01/10/2019 03/15/18   Tysinger, Camelia Eng, PA-C    Allergies Morphine   REVIEW OF SYSTEMS  Negative except as noted here or in the History of Present Illness.   PHYSICAL EXAMINATION  Initial Vital Signs Blood pressure 117/88, pulse 64, temperature (!) 97.4 F (36.3 C), temperature source Oral, resp. rate 15, height 6' (1.829 m), weight 108.9 kg, SpO2 99 %.  Examination General: Well-developed, well-nourished male in no acute distress; appearance consistent with age of record HENT: normocephalic; atraumatic Eyes: pupils equal, round and reactive to light; extraocular muscles intact Neck: supple Heart: regular rate and rhythm Lungs: clear to auscultation bilaterally Abdomen: soft; nondistended; nontender; bowel sounds present Extremities: No deformity; pulses normal;  decreased muscle tone on the left Neurologic: Awake, alert and oriented x 2; mild dysarthria; left hemiparesis; no facial droop Skin: Warm and dry Psychiatric: Normal mood and affect   RESULTS  Summary of this visit's results, reviewed by myself:   EKG Interpretation  Date/Time:    Ventricular Rate:    PR Interval:    QRS Duration:   QT Interval:    QTC Calculation:   R Axis:     Text Interpretation:        Laboratory Studies: Results for orders placed or performed during the hospital encounter of 01/09/19 (from the past 24 hour(s))  CBG monitoring, ED     Status: Abnormal   Collection Time: 01/09/19 11:32  PM  Result Value Ref Range   Glucose-Capillary 103 (H) 70 - 99 mg/dL   Comment 1 Notify RN    Comment 2 Document in Chart    Imaging Studies: No results found.  ED COURSE and MDM  Nursing notes and initial vitals signs, including pulse oximetry, reviewed.  Vitals:   01/10/19 0100 01/10/19 0130 01/10/19 0200 01/10/19 0248  BP: 129/77 113/74 114/79 107/74  Pulse: 63 60 (!) 58 64  Resp: '18 17 13 15  ' Temp:      TempSrc:      SpO2: 99% 99% 98% 100%  Weight:      Height:       2:51 AM Blood sugar now 140.  Patient given meal.  Significant other is comfortable taking him home at this time.  He was advised to eat regular meals and to be careful when injecting his insulin.  PROCEDURES    ED DIAGNOSES     ICD-10-CM   1. Hypoglycemia due to insulin E16.0    T38.3X5A        Pietra Zuluaga, Jenny Reichmann, MD 01/10/19 [phone]

## 2019-01-09 NOTE — ED Notes (Signed)
Bed: WA20 Expected date:  Expected time:  Means of arrival:  Comments: EMS hypoglycemic

## 2019-01-09 NOTE — ED Triage Notes (Signed)
Per EMS, patient coming from home with complaints of hypoglycemia. Patient was unresponsive with a CBG of 28. EMS administered 25 grams of D10 which brought CBG up to 111. CBG dropped to 60 rapidly but patient remained alert. EMS administered another 20 grams of D10 which maintained CBG of 84.   EMS also administered 4 mg of Zofran because patient was vomiting after awakening.   Patient has a history of TBI and sometimes he administers too much insulin on accident.    BP: 128/68 HR: 86 SPO2: 100% RA  RR: 16

## 2019-01-10 DIAGNOSIS — E11649 Type 2 diabetes mellitus with hypoglycemia without coma: Secondary | ICD-10-CM | POA: Diagnosis not present

## 2019-01-10 LAB — CBG MONITORING, ED
GLUCOSE-CAPILLARY: 74 mg/dL (ref 70–99)
GLUCOSE-CAPILLARY: 140 mg/dL — AB (ref 70–99)

## 2019-01-10 NOTE — ED Notes (Signed)
Patient CBG was 74 (data not transferring). Provided patient with orange juice and graham crackers.

## 2019-01-10 NOTE — ED Notes (Signed)
CBG 140  

## 2019-01-26 ENCOUNTER — Other Ambulatory Visit: Payer: Self-pay | Admitting: Medical

## 2019-01-28 ENCOUNTER — Other Ambulatory Visit: Payer: Self-pay | Admitting: Medical

## 2019-03-03 ENCOUNTER — Other Ambulatory Visit: Payer: Self-pay | Admitting: Medical

## 2019-03-04 ENCOUNTER — Telehealth: Payer: Self-pay | Admitting: Neurology

## 2019-03-04 NOTE — Telephone Encounter (Signed)
error 

## 2019-05-10 ENCOUNTER — Telehealth: Payer: Self-pay | Admitting: Medical

## 2019-05-10 DIAGNOSIS — R05 Cough: Secondary | ICD-10-CM

## 2019-05-10 DIAGNOSIS — Z20822 Contact with and (suspected) exposure to covid-19: Secondary | ICD-10-CM

## 2019-05-10 DIAGNOSIS — Z20828 Contact with and (suspected) exposure to other viral communicable diseases: Secondary | ICD-10-CM

## 2019-05-10 DIAGNOSIS — R059 Cough, unspecified: Secondary | ICD-10-CM

## 2019-05-10 NOTE — Telephone Encounter (Signed)
I spoke to his partner today, Brandon [name] who is also a patient.  She is positive coronavirus and they have been in the same house together quarantining to gather but not leaving the house.  He does start having symptoms today with cough.  So he likely has coronavirus as well.  I advised good hydration with clear fluids, he will use Tessalon Perles for cough that they have at home, but advised of worsening symptoms in the next several days such as high fever, uncontrollable cough, weakness, uncontrollable nausea [name] vomiting, shortness of breath [name] other worsening symptoms to consider being evaluated at the hospital triage tent.  He is high risk given his underlying medical conditions.  They were instructed to call back if questions

## 2019-06-10 ENCOUNTER — Other Ambulatory Visit: Payer: Self-pay

## 2019-06-10 ENCOUNTER — Telehealth: Payer: Self-pay | Admitting: Medical

## 2019-06-10 ENCOUNTER — Ambulatory Visit (INDEPENDENT_AMBULATORY_CARE_PROVIDER_SITE_OTHER): Payer: Medicare Other | Admitting: Medical

## 2019-06-10 DIAGNOSIS — F339 Major depressive disorder, recurrent, unspecified: Secondary | ICD-10-CM | POA: Diagnosis not present

## 2019-06-10 DIAGNOSIS — I10 Essential (primary) hypertension: Secondary | ICD-10-CM

## 2019-06-10 DIAGNOSIS — E0865 Diabetes mellitus due to underlying condition with hyperglycemia: Secondary | ICD-10-CM

## 2019-06-10 DIAGNOSIS — N183 Chronic kidney disease, stage 3 (moderate): Secondary | ICD-10-CM

## 2019-06-10 DIAGNOSIS — R609 Edema, unspecified: Secondary | ICD-10-CM

## 2019-06-10 DIAGNOSIS — N179 Acute kidney failure, unspecified: Secondary | ICD-10-CM | POA: Diagnosis not present

## 2019-06-10 DIAGNOSIS — I6930 Unspecified sequelae of cerebral infarction: Secondary | ICD-10-CM

## 2019-06-10 DIAGNOSIS — Z9119 Patient's noncompliance with other medical treatment and regimen: Secondary | ICD-10-CM | POA: Diagnosis not present

## 2019-06-10 DIAGNOSIS — Z87898 Personal history of other specified conditions: Secondary | ICD-10-CM

## 2019-06-10 DIAGNOSIS — R05 Cough: Secondary | ICD-10-CM

## 2019-06-10 DIAGNOSIS — Z8673 Personal history of transient ischemic attack (TIA), and cerebral infarction without residual deficits: Secondary | ICD-10-CM | POA: Diagnosis not present

## 2019-06-10 DIAGNOSIS — R059 Cough, unspecified: Secondary | ICD-10-CM

## 2019-06-10 DIAGNOSIS — Z91199 Patient's noncompliance with other medical treatment and regimen due to unspecified reason: Secondary | ICD-10-CM

## 2019-06-10 DIAGNOSIS — D649 Anemia, unspecified: Secondary | ICD-10-CM

## 2019-06-10 NOTE — Telephone Encounter (Signed)
Please call him back Tuesday morning.  I need him to come in tomorrow for labs and have him go to [facility] imaging for chest x-ray.  He will also need vital signs and pulse oximetry when he comes in for labs tomorrow.  Please screen again for fever, but as long as he does not have fever he can come in for labs.  (FYI-I discussed case with Dr. Redmond School, and I also called infectious disease, Dr. Prince Rome.   He was presumed to have COVID-19 about a month ago but is not considered to be contagious at this point as he self quarantine for almost 3 weeks and currently his symptoms are not felt to be COVID related.  He has not had fever in over 2 weeks).   Please go to [facility] for your chest  xray.   Their hours are 8am - 4:30 pm Monday - Friday.  Take your insurance card with you.  [facility] [phone]  [facility] [address], [place], [place] [address]  [address] [place], [place] [address]

## 2019-06-10 NOTE — Telephone Encounter (Signed)
Call in Accu AVVIVA strips and lancets #100 of each, 11 refills.

## 2019-06-10 NOTE — Progress Notes (Signed)
Subjective:     Patient ID: Brandon [name], male   DOB: [birth year], 58 y.o.   MRN: [id number]  This visit type was conducted due to national recommendations for restrictions regarding the COVID-19 Pandemic (e.g. social distancing) in an effort to limit this patient's exposure and mitigate transmission in our community.  Due to their co-morbid illnesses, this patient is at least at moderate risk for complications without adequate follow up.  This format is felt to be most appropriate for this patient at this time.    Documentation for virtual audio and video telecommunications through Zoom encounter:  The patient was located at home. The provider was located in the office. The patient did consent to this visit and is aware of possible charges through their insurance for this visit.  The other persons participating in this telemedicine service were none. Time spent on call was 25 minutes and in review of previous records >25 minutes total.  This virtual service is not related to other E/M service within previous 7 days.   HPI Chief Complaint  Patient presents with  . Follow-up    had covid, still have problem with mucus, fluid build up    Virtual consult for follow-up from illness as well as general follow-up.  He notes having COVID symptoms about a month ago.  His girlfriend who is also my patient tested positive for COVID-19 back around May 12, 2019.  They live in the same household and they were both sick about the same time.  He had the same symptoms including fever, cough, shortness of breath, body aches and chills, he also had decreased sense of taste and smell.  They both had symptoms lasting for at least 2 weeks.  He quarantined at home for about 3 weeks.  His symptoms mostly resolved but he still has some lingering cough occasionally and he has some mucus that seems to give him some problems.  He however does not have any more fever, body aches, chills, change in taste or smell.   Although symptoms have gone.  Overall is mostly back to normal except the mucus and some residual cough.  He is not sure if he needs to take anything for this.  He is concerned because his mom died of aspiration pneumonia so he is worried about this.  He also is concerned whether or not he needs to be on a stronger fluid pill.    He is taking  Furosemide daily but still feels swelling in the legs.  He notes only urinating about 3 times per day.    Has cut back on soda   Diabetes He has not seen his diabetic doctor in a long time.  He has not been using his NovoLog Mix mealtime insulin.  He is using the Lantus 45 units twice daily.  Using the weekly Ozempic.  He has not been checking his blood sugars until last few days.  His meter is the Brandon [name].  Needs strips and lancets.  His blood sugar was 297 this morning fasting, the last few days 190-240.  Hyperlipidemia-he is taking atorvastatin 40 mg at bedtime, but not taking aspirin  Hypertension-he is taking amlodipine 10 mg daily  He is taking quetiapine 25 mg daily at 6 PM, sertraline 100 mg 1/2 tablet daily in the morning, Keppra 500 mg 1/2 tablet twice a day for mood and sleep.  He is taking furosemide 10 mg in the morning for swelling  No other aggravating or relieving factors. No  other complaint.   Review of Systems As in subjective    Objective:   Physical Exam Due to coronavirus pandemic stay at home measures, patient visit was virtual and they were not examined in person.   Gen: wd, wn, nad      Assessment:     Encounter Diagnoses  Name Primary?  . Cough Yes  . Essential hypertension   . Diabetes mellitus due to underlying condition, uncontrolled, with hyperglycemia (Brandon [name])   . History of stroke   . History of seizure   . History of cerebrovascular accident (CVA) with residual deficit   . Edema, unspecified type   . Depression, recurrent (Brandon [name])   . Anemia, unspecified type   . Acute renal failure superimposed on  stage 3 chronic kidney disease, unspecified acute renal failure type (Brandon [name])   . Noncompliance        Plan:     We discussed his current symptoms for cough.  He did have presumably COVID-19 about a month ago as his girlfriend was positive and we did a virtual consult with her at that time.  They live in the same household.  They had symptoms at the same time.  Since he has a residual cough but also has what sounds like edema and decreased urine output, we need to screen for CHF and kidney failure.  He is a noncompliant uncontrolled diabetic.  I called infectious disease and spoke to Dr. Prince [name].  He felt that if he has not had fever in the last week and did appropriate quarantine 3 weeks ago as we discussed and he should be safe to come in for labs and chest x-ray.  Thus we will have him come in for vital signs, labs, will send him for chest x-ray.  In general he needs to do follow-up with his endocrinologist.  We will try to get a baseline update on his labs when we bring him in.  He needs to get back on mealtime insulin.  He is also not seeing his psychiatrist as he also should be doing.    F/u for labs, CXR.  Brandon [name] was seen today for follow-up.  Diagnoses and all orders for this visit:  Cough -     DG Chest 2 View; Future -     CBC with Differential/Platelet; Future -     Pulse oximetry (single); Future  Essential hypertension -     Comprehensive metabolic panel; Future -     Brain natriuretic peptide; Future -     CBC with Differential/Platelet; Future -     Pulse oximetry (single); Future  Diabetes mellitus due to underlying condition, uncontrolled, with hyperglycemia (Brandon [name]) -     Hemoglobin A1c; Future  History of stroke  History of seizure  History of cerebrovascular accident (CVA) with residual deficit  Edema, unspecified type -     Brain natriuretic peptide; Future -     CBC with Differential/Platelet; Future  Depression, recurrent (Brandon [name])  Anemia, unspecified type -      CBC with Differential/Platelet; Future  Acute renal failure superimposed on stage 3 chronic kidney disease, unspecified acute renal failure type (Brandon [name]) -     Comprehensive metabolic panel; Future -     Brain natriuretic peptide; Future  Noncompliance

## 2019-06-11 ENCOUNTER — Other Ambulatory Visit: Payer: Self-pay

## 2019-06-11 DIAGNOSIS — E0865 Diabetes mellitus due to underlying condition with hyperglycemia: Secondary | ICD-10-CM

## 2019-06-11 MED ORDER — ACCU-CHEK SOFT TOUCH LANCETS MISC: 12 refills | Status: DC

## 2019-06-11 MED ORDER — ACCU-CHEK ACTIVE VI STRP: ORAL_STRIP | 12 refills | Status: DC

## 2019-06-11 NOTE — Telephone Encounter (Signed)
Done  Lancets and test strips sent to pharmacy.

## 2019-06-11 NOTE — Telephone Encounter (Signed)
Ok, needs to be the next few days if possible.

## 2019-06-11 NOTE — Telephone Encounter (Signed)
I called and patient is not able to come today.  He doesn't have a ride.  They will call back to schedule lab appointment when he can get a ride.

## 2019-06-19 ENCOUNTER — Other Ambulatory Visit: Payer: Self-pay | Admitting: Medical

## 2019-06-20 NOTE — Telephone Encounter (Signed)
Is this refill appropriate?  

## 2019-07-02 ENCOUNTER — Other Ambulatory Visit: Payer: Self-pay

## 2019-07-02 ENCOUNTER — Ambulatory Visit (INDEPENDENT_AMBULATORY_CARE_PROVIDER_SITE_OTHER): Payer: Medicare Other | Admitting: Medical

## 2019-07-02 DIAGNOSIS — N529 Male erectile dysfunction, unspecified: Secondary | ICD-10-CM | POA: Diagnosis not present

## 2019-07-02 DIAGNOSIS — I6930 Unspecified sequelae of cerebral infarction: Secondary | ICD-10-CM

## 2019-07-02 DIAGNOSIS — I10 Essential (primary) hypertension: Secondary | ICD-10-CM

## 2019-07-02 DIAGNOSIS — D649 Anemia, unspecified: Secondary | ICD-10-CM | POA: Diagnosis not present

## 2019-07-02 DIAGNOSIS — F339 Major depressive disorder, recurrent, unspecified: Secondary | ICD-10-CM

## 2019-07-02 DIAGNOSIS — R609 Edema, unspecified: Secondary | ICD-10-CM

## 2019-07-02 DIAGNOSIS — E1169 Type 2 diabetes mellitus with other specified complication: Secondary | ICD-10-CM

## 2019-07-02 DIAGNOSIS — G4733 Obstructive sleep apnea (adult) (pediatric): Secondary | ICD-10-CM | POA: Diagnosis not present

## 2019-07-02 DIAGNOSIS — Z9119 Patient's noncompliance with other medical treatment and regimen: Secondary | ICD-10-CM | POA: Diagnosis not present

## 2019-07-02 DIAGNOSIS — E785 Hyperlipidemia, unspecified: Secondary | ICD-10-CM

## 2019-07-02 DIAGNOSIS — Z91199 Patient's noncompliance with other medical treatment and regimen due to unspecified reason: Secondary | ICD-10-CM

## 2019-07-02 DIAGNOSIS — Z85528 Personal history of other malignant neoplasm of kidney: Secondary | ICD-10-CM | POA: Diagnosis not present

## 2019-07-02 DIAGNOSIS — E43 Unspecified severe protein-calorie malnutrition: Secondary | ICD-10-CM | POA: Diagnosis not present

## 2019-07-02 DIAGNOSIS — E1165 Type 2 diabetes mellitus with hyperglycemia: Secondary | ICD-10-CM

## 2019-07-02 DIAGNOSIS — E0865 Diabetes mellitus due to underlying condition with hyperglycemia: Secondary | ICD-10-CM | POA: Diagnosis not present

## 2019-07-02 NOTE — Progress Notes (Signed)
Subjective:     Patient ID: Brandon [name], male   DOB: 29-Jul-1961, 58 y.o.   MRN: [id number]  This visit type was conducted due to national recommendations for restrictions regarding the COVID-19 Pandemic (e.g. social distancing) in an effort to limit this patient's exposure and mitigate transmission in our community.  Due to their co-morbid illnesses, this patient is at least at moderate risk for complications without adequate follow up.  This format is felt to be most appropriate for this patient at this time.    Documentation for virtual audio and video telecommunications through Zoom encounter:  The patient was located at home. The provider was located in the office. The patient did consent to this visit and is aware of possible charges through their insurance for this visit.  The other persons participating in this telemedicine service were none. Time spent on call was 15 minutes and in review of previous records >15 minutes total.  This virtual service is not related to other E/M service within previous 7 days.   HPI Chief Complaint  Patient presents with  . follow up    follow up   Virtual consult for follow-up.  I did a virtual consult with him earlier this month.  He never came in for labs as we discussed.  In recent weeks he has had swelling in his legs worsening.  He denies eating more salt.  He did run out of Lasix.  He denies chest pain or shortness of breath.  He denies any weight gain however he is not checking his weight.  According to emergency department visit back in February he was 250 pounds.  His significant other weighed him today while we are on the fall and his weight today is 266 pounds so there has been some weight gain.  He does seem to think he has swelling in his hands and feet.  Recent blood sugars are reportedly ranging from 103-129.  However he ran out of test strips.  He reports compliance with Lantus 55 units, ozempic weekly, novolog 3-5 units in  the mornings once daily.  Hyperlipidemia-he is taking atorvastatin 40 mg at bedtime, but not taking aspirin  Hypertension-he is taking amlodipine 10 mg daily  He is taking quetiapine 25 mg daily at 6 PM, sertraline 100 mg 1/2 tablet daily in the morning, Keppra 500 mg 1/2 tablet twice a day for mood and sleep.  No other aggravating or relieving factors. No other complaint.   Review of Systems As in subjective    Objective:   Physical Exam Due to coronavirus pandemic stay at home measures, patient visit was virtual and they were not examined in person.   Gen: wd, wn, nad      Assessment:     Encounter Diagnoses  Name Primary?  . Essential hypertension Yes  . Obstructive sleep apnea   . Diabetes mellitus due to underlying condition, uncontrolled, with hyperglycemia ([place])   . Dyslipidemia associated with type 2 diabetes mellitus ([place])   . Depression, recurrent ([place])   . Erectile dysfunction, unspecified erectile dysfunction type   . History of cerebrovascular accident (CVA) with residual deficit   . History of renal cell cancer   . Noncompliance   . Edema, unspecified type        Plan:     Basically other than increased leg edema there is been no change from his last virtual visit in early July.  He never came in for labs as we discussed.  So I  will once again have him come in tomorrow for labs.  Continue current medications.  Avoid added salt.  I advised the need to continue checking his blood sugars.  He wanted a refill on Viagra today.  He has not used Viagra in 3 years.  He has not had any updated cardiac eval in several years other than an EKG.  I do not feel entirely comfortable refilling this right now given his risk factors for heart disease.  I did review a recent EKG from his last emergency department visit within the past year.  Pending labs we can reconsider refills, but he may need updated cardiology eval first.  I will let him know once I see his lab  results.  In general he needs to do follow-up with his endocrinologist.    He is also not seeing his psychiatrist as he also should be doing.    F/u for labs tomorrow  Brandon [name] was seen today for follow up.  Diagnoses and all orders for this visit:  Essential hypertension  Obstructive sleep apnea  Diabetes mellitus due to underlying condition, uncontrolled, with hyperglycemia ([place])  Dyslipidemia associated with type 2 diabetes mellitus ([facility])  Depression, recurrent ([facility])  Erectile dysfunction, unspecified erectile dysfunction type  History of cerebrovascular accident (CVA) with residual deficit  History of renal cell cancer  Noncompliance  Edema, unspecified type

## 2019-07-03 ENCOUNTER — Other Ambulatory Visit: Payer: Self-pay

## 2019-07-03 ENCOUNTER — Other Ambulatory Visit: Payer: Medicare Other

## 2019-07-03 DIAGNOSIS — R609 Edema, unspecified: Secondary | ICD-10-CM | POA: Diagnosis not present

## 2019-07-03 DIAGNOSIS — N179 Acute kidney failure, unspecified: Secondary | ICD-10-CM

## 2019-07-03 DIAGNOSIS — R0602 Shortness of breath: Secondary | ICD-10-CM | POA: Diagnosis not present

## 2019-07-03 DIAGNOSIS — R059 Cough, unspecified: Secondary | ICD-10-CM

## 2019-07-03 DIAGNOSIS — E0865 Diabetes mellitus due to underlying condition with hyperglycemia: Secondary | ICD-10-CM | POA: Diagnosis not present

## 2019-07-03 DIAGNOSIS — R05 Cough: Secondary | ICD-10-CM

## 2019-07-03 DIAGNOSIS — I10 Essential (primary) hypertension: Secondary | ICD-10-CM | POA: Diagnosis not present

## 2019-07-03 DIAGNOSIS — R899 Unspecified abnormal finding in specimens from other organs, systems and tissues: Secondary | ICD-10-CM | POA: Diagnosis not present

## 2019-07-03 DIAGNOSIS — D649 Anemia, unspecified: Secondary | ICD-10-CM | POA: Diagnosis not present

## 2019-07-03 DIAGNOSIS — N183 Chronic kidney disease, stage 3 (moderate): Secondary | ICD-10-CM | POA: Diagnosis not present

## 2019-07-03 DIAGNOSIS — R531 Weakness: Secondary | ICD-10-CM | POA: Diagnosis not present

## 2019-07-03 DIAGNOSIS — E43 Unspecified severe protein-calorie malnutrition: Secondary | ICD-10-CM | POA: Diagnosis not present

## 2019-07-03 DIAGNOSIS — I6930 Unspecified sequelae of cerebral infarction: Secondary | ICD-10-CM | POA: Diagnosis not present

## 2019-07-03 NOTE — Progress Notes (Unsigned)
Wt Readings from Last 3 Encounters:  07/03/19 268 lb 9.6 oz (121.8 kg)  01/09/19 240 lb (108.9 kg)  04/26/18 265 lb (120.2 kg)   BP Readings from Last 3 Encounters:  07/03/19 126/88  01/10/19 (!) 149/100  04/26/18 128/82

## 2019-07-04 LAB — CBC WITH DIFFERENTIAL/PLATELET
Basophils Absolute: 0 10*3/uL (ref 0.0–0.2)
Basos: 1 %
EOS (ABSOLUTE): 0.1 10*3/uL (ref 0.0–0.4)
Eos: 3 %
Hematocrit: 33 % — ABNORMAL LOW (ref 37.5–51.0)
Hemoglobin: 10.7 g/dL — ABNORMAL LOW (ref 13.0–17.7)
Immature Grans (Abs): 0 10*3/uL (ref 0.0–0.1)
Immature Granulocytes: 0 %
Lymphocytes Absolute: 1.7 10*3/uL (ref 0.7–3.1)
Lymphs: 33 %
MCH: 29.5 pg (ref 26.6–33.0)
MCHC: 32.4 g/dL (ref 31.5–35.7)
MCV: 91 fL (ref 79–97)
Monocytes Absolute: 0.4 10*3/uL (ref 0.1–0.9)
Monocytes: 8 %
Neutrophils Absolute: 2.9 10*3/uL (ref 1.4–7.0)
Neutrophils: 55 %
Platelets: 203 10*3/uL (ref 150–450)
RBC: 3.63 x10E6/uL — ABNORMAL LOW (ref 4.14–5.80)
RDW: 12.1 % (ref 11.6–15.4)
WBC: 5.2 10*3/uL (ref 3.4–10.8)

## 2019-07-04 LAB — HEMOGLOBIN A1C
Est. average glucose Bld gHb Est-mCnc: 200 mg/dL
Hgb A1c MFr Bld: 8.6 % — ABNORMAL HIGH (ref 4.8–5.6)

## 2019-07-04 LAB — COMPREHENSIVE METABOLIC PANEL
ALT: 13 IU/L (ref 0–44)
AST: 13 IU/L (ref 0–40)
Albumin/Globulin Ratio: 1.5 (ref 1.2–2.2)
Albumin: 4.2 g/dL (ref 3.8–4.9)
Alkaline Phosphatase: 130 IU/L — ABNORMAL HIGH (ref 39–117)
BUN/Creatinine Ratio: 12 (ref 9–20)
BUN: 20 mg/dL (ref 6–24)
Bilirubin Total: 0.4 mg/dL (ref 0.0–1.2)
CO2: 24 mmol/L (ref 20–29)
Calcium: 9.2 mg/dL (ref 8.7–10.2)
Chloride: 98 mmol/L (ref 96–106)
Creatinine, Ser: 1.67 mg/dL — ABNORMAL HIGH (ref 0.76–1.27)
GFR calc Af Amer: 52 mL/min/{1.73_m2} — ABNORMAL LOW (ref >59)
GFR calc non Af Amer: 45 mL/min/{1.73_m2} — ABNORMAL LOW (ref >59)
Globulin, Total: 2.8 g/dL (ref 1.5–4.5)
Glucose: 355 mg/dL — ABNORMAL HIGH (ref 65–99)
Potassium: 4.7 mmol/L (ref 3.5–5.2)
Sodium: 136 mmol/L (ref 134–144)
Total Protein: 7 g/dL (ref 6.0–8.5)

## 2019-07-04 LAB — BRAIN NATRIURETIC PEPTIDE: BNP: 81.9 pg/mL (ref 0.0–100.0)

## 2019-07-05 ENCOUNTER — Other Ambulatory Visit: Payer: Self-pay | Admitting: Medical

## 2019-07-05 MED ORDER — INSULIN ASPART PROT & ASPART (70-30 MIX) 100 UNIT/ML ~~LOC~~ SUSP: 0.0000 [IU] | Freq: Three times a day (TID) | SUBCUTANEOUS | 3 refills | Status: DC

## 2019-07-05 MED ORDER — AMLODIPINE BESYLATE 5 MG PO TABS: 5.0000 mg | ORAL_TABLET | Freq: Every day | ORAL | 3 refills | Status: DC

## 2019-07-05 MED ORDER — LOSARTAN POTASSIUM-HCTZ 50-12.5 MG PO TABS: 1.0000 | ORAL_TABLET | Freq: Every day | ORAL | 0 refills | Status: DC

## 2019-07-05 MED ORDER — ASPIRIN EC 81 MG PO TBEC: 81.0000 mg | DELAYED_RELEASE_TABLET | Freq: Every day | ORAL | 3 refills | Status: DC

## 2019-07-05 MED ORDER — PEN NEEDLES 32G X 4 MM MISC: 1.0000 | Freq: Every day | 2 refills | Status: DC

## 2019-07-05 MED ORDER — SERTRALINE HCL 100 MG PO TABS: 100.0000 mg | ORAL_TABLET | Freq: Every day | ORAL | 1 refills | Status: DC

## 2019-07-05 MED ORDER — LANTUS SOLOSTAR 100 UNIT/ML ~~LOC~~ SOPN: 60.0000 [IU] | PEN_INJECTOR | Freq: Every day | SUBCUTANEOUS | 3 refills | Status: DC

## 2019-07-05 MED ORDER — OZEMPIC (1 MG/DOSE) 2 MG/1.5ML ~~LOC~~ SOPN: 2.0000 mg | PEN_INJECTOR | SUBCUTANEOUS | 5 refills | Status: DC

## 2019-07-05 MED ORDER — QUETIAPINE FUMARATE 25 MG PO TABS: 25.0000 mg | ORAL_TABLET | Freq: Every day | ORAL | 1 refills | Status: DC

## 2019-07-05 MED ORDER — ATORVASTATIN CALCIUM 40 MG PO TABS: 40.0000 mg | ORAL_TABLET | Freq: Every day | ORAL | 3 refills | Status: DC

## 2019-07-07 LAB — B12 AND FOLATE PANEL
Folate: 4 ng/mL (ref >3.0)
Vitamin B-12: 346 pg/mL (ref 232–1245)

## 2019-07-07 LAB — SPECIMEN STATUS REPORT

## 2019-07-08 ENCOUNTER — Other Ambulatory Visit: Payer: Self-pay

## 2019-07-08 DIAGNOSIS — N529 Male erectile dysfunction, unspecified: Secondary | ICD-10-CM

## 2019-07-08 DIAGNOSIS — I10 Essential (primary) hypertension: Secondary | ICD-10-CM

## 2019-07-08 DIAGNOSIS — E0865 Diabetes mellitus due to underlying condition with hyperglycemia: Secondary | ICD-10-CM

## 2019-08-17 ENCOUNTER — Other Ambulatory Visit: Payer: Self-pay | Admitting: Medical

## 2019-08-29 ENCOUNTER — Telehealth: Payer: Self-pay | Admitting: Medical

## 2019-08-29 NOTE — Telephone Encounter (Signed)
A person only gets pneumonia shot every 5 years if you have certain diagnoses and lung disease.  He should get a flu shot every year so he can come in for flu shot.  He is also due for shingles vaccine but call insurance first to make sure they cover this

## 2019-08-29 NOTE — Telephone Encounter (Signed)
Pt called and is requesting a pneumonia shot states he gets one every year,. Pt can be reached at [phone]

## 2019-08-30 NOTE — Telephone Encounter (Signed)
Lm for patient to call back for message from provider.

## 2019-09-02 ENCOUNTER — Other Ambulatory Visit: Payer: Self-pay | Admitting: Medical

## 2019-09-02 NOTE — Telephone Encounter (Signed)
Message was sent to patient via mychart  

## 2019-09-23 ENCOUNTER — Other Ambulatory Visit: Payer: Self-pay | Admitting: Medical

## 2019-10-02 ENCOUNTER — Other Ambulatory Visit: Payer: Self-pay | Admitting: Medical

## 2019-10-21 ENCOUNTER — Other Ambulatory Visit: Payer: Self-pay | Admitting: Medical

## 2019-10-30 ENCOUNTER — Other Ambulatory Visit: Payer: Self-pay

## 2019-10-30 ENCOUNTER — Ambulatory Visit (INDEPENDENT_AMBULATORY_CARE_PROVIDER_SITE_OTHER): Payer: Medicare Other | Admitting: Medical

## 2019-10-30 ENCOUNTER — Encounter: Payer: Self-pay | Admitting: Medical

## 2019-10-30 VITALS — Wt 225.0 lb

## 2019-10-30 DIAGNOSIS — R059 Cough, unspecified: Secondary | ICD-10-CM

## 2019-10-30 DIAGNOSIS — R195 Other fecal abnormalities: Secondary | ICD-10-CM | POA: Diagnosis not present

## 2019-10-30 DIAGNOSIS — K5792 Diverticulitis of intestine, part unspecified, without perforation or abscess without bleeding: Secondary | ICD-10-CM | POA: Diagnosis not present

## 2019-10-30 DIAGNOSIS — Z8719 Personal history of other diseases of the digestive system: Secondary | ICD-10-CM | POA: Diagnosis not present

## 2019-10-30 DIAGNOSIS — R05 Cough: Secondary | ICD-10-CM | POA: Diagnosis not present

## 2019-10-30 DIAGNOSIS — R0981 Nasal congestion: Secondary | ICD-10-CM

## 2019-10-30 MED ORDER — AMOXICILLIN-POT CLAVULANATE 875-125 MG PO TABS: 1.0000 | ORAL_TABLET | Freq: Two times a day (BID) | ORAL | 0 refills | Status: DC

## 2019-10-30 MED ORDER — HYDROCODONE-ACETAMINOPHEN 5-325 MG PO TABS: 1.0000 | ORAL_TABLET | Freq: Four times a day (QID) | ORAL | 0 refills | Status: DC | PRN

## 2019-10-30 NOTE — Progress Notes (Signed)
Subjective:     Patient ID: Brandon [name], male   DOB: 10-17-1961, 58 y.o.   MRN: [id number]  This visit type was conducted due to national recommendations for restrictions regarding the COVID-19 Pandemic (e.g. social distancing) in an effort to limit this patient's exposure and mitigate transmission in our community.  Due to their co-morbid illnesses, this patient is at least at moderate risk for complications without adequate follow up.  This format is felt to be most appropriate for this patient at this time.    Documentation for virtual audio and video telecommunications through Zoom encounter:  The patient was located at home. The provider was located in the office. The patient did consent to this visit and is aware of possible charges through their insurance for this visit.  The other persons participating in this telemedicine service were none. Time spent on call was 20 minutes and in review of previous records 20 minutes total.  This virtual service is not related to other E/M service within previous 7 days.   HPI Chief Complaint  Patient presents with  . sore throat    sore throat off an on x 5 days. coughing up some phelgm. pt drank some hot coffee last night.    Virtual consult today.  Started 4-5 days ago with phlegm and mucous, sore throat.  Has had some cough, coughing up mucous.  No fever.  No SOB.  No vomiting, no nausea.  Has had some loose stool for a week.  Loose stool about 3 times per day. No blood in stool.   Has some lower belly pain on left side, and stools is darker brown, real dark.   Has some body aches.  Throat is real red.  No sick contacts.   Has mainly been home other than with girlfriend to the store occasionally.   No other aggravating or relieving factors. No other complaint.   Past Medical History:  Diagnosis Date  . Acute ischemic stroke ([place]) 11/2013  . Allergy   . Anxiety   . Arthritis   . 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 Depression    BH Adm in [place] Depression  . Diabetes mellitus    DKA prior hospitalization  . Diverticulitis    s/p micorperforation Sept 2012-managed conservatively by Gen surgery  . ED (erectile dysfunction)   . Focal seizure ([place]) 11/2013   due to ischemic stroke  . GERD (gastroesophageal reflux disease)   . Hiatal hernia   . Hyperlipidemia   . Hypertension   . Kidney tumor 09/2011   Renal cell CA  . Seizures ([place])    none since 2016, taking Keppra - maw  . Sleep apnea   . Thyroid disease    "weak thyroid" per MD  . Wears glasses     Current Outpatient Medications on File Prior to Visit  Medication Sig Dispense Refill  . amLODipine (NORVASC) 5 MG tablet Take 1 tablet (5 mg total) by mouth daily. 90 tablet 3  . aspirin EC 81 MG tablet Take 1 tablet (81 mg total) by mouth daily. 90 tablet 3  . atorvastatin (LIPITOR) 40 MG tablet Take 1 tablet (40 mg total) by mouth daily. 90 tablet 3  . blood glucose meter kit and supplies Dispense based on patient and insurance preference. Use up to four times daily as directed. (FOR ICD-9 250.00, 250.01). 1 each 0  . gabapentin (NEURONTIN) 300 MG capsule Take 1  capsule (300 mg total) by mouth at bedtime. For 2 weeks. Then take 300 mg by mouth twice a day thereafter as needed 90 capsule 1  . glucose blood (ACCU-CHEK ACTIVE STRIPS) test strip Use as instructed 100 each 12  . insulin aspart protamine- aspart (NOVOLOG MIX 70/30) (70-30) 100 UNIT/ML injection Inject 0-0.15 mLs (0-15 Units total) into the skin 3 (three) times daily. Sliding scale 10 mL 3  . Insulin Glargine (LANTUS SOLOSTAR) 100 UNIT/ML Solostar Pen Inject 60 Units into the skin daily. 15 pen 3  . Insulin Pen Needle (PEN NEEDLES) 32G X 4 MM MISC 1 each by Does not apply route daily. Use for insulin pens 200 each 2  . Lancets (ACCU-CHEK SOFT TOUCH) lancets Use as instructed 100 each 12  . levETIRAcetam (KEPPRA) 500 MG tablet Take 2 tablets (1,000 mg total) by  mouth daily. 60 tablet 11  . losartan-hydrochlorothiazide (HYZAAR) 50-12.5 MG tablet TAKE 1 TABLET BY MOUTH EVERY DAY 90 tablet 0  . OZEMPIC, 1 MG/DOSE, 2 MG/1.5ML SOPN INJECT 2 MG INTO THE SKIN ONCE A WEEK. 3 pen 0  . QUEtiapine (SEROQUEL) 25 MG tablet Take 1 tablet (25 mg total) by mouth daily. 30 tablet 1  . sertraline (ZOLOFT) 100 MG tablet Take 1 tablet (100 mg total) by mouth daily. 30 tablet 1   No current facility-administered medications on file prior to visit.      Review of Systems As in subjective    Objective:   Physical Exam Due to coronavirus pandemic stay at home measures, patient visit was virtual and they were not examined in person.  Wt 225 lb (102.1 kg)   BMI 31.83 kg/m       Assessment:     Encounter Diagnoses  Name Primary?  . Diverticulitis Yes  . Cough   . Sinus congestion   . Dark stools   . History of diverticulitis        Plan:     I spoke with Brandon [name] today for symptoms that are suggestive of diverticulitis and possible sinus congestion.  He had Covid just a few months ago so I doubt this is Covid related.  We discussed supportive care for the sinus congestion.  I am more concerned about his suspected diverticulitis.  He has a history of diverticulitis with perforation in the past.  It sounds like his symptoms have been gradually worsening to this point.  He will begin medications below, rest, we discussed hydration, we discussed bowel rest and trying to keep solid food intake to a minimum today and tomorrow or at least with a brat diet for the few next days.  We discussed adjusting insulin down to avoid any hypoglycemia.  I advised if any worsening symptoms such as obvious blood in the stool, worsening pain, fever over 101, significant worsening of symptoms in general to go to the hospital emergency department.  As long as he is improving recheck in 1 week.  He is staying at home and self quarantine in general as discussed.  Brandon [name] was seen today  for sore throat.  Diagnoses and all orders for this visit:  Diverticulitis  Cough  Sinus congestion  Dark stools  History of diverticulitis  Other orders -     amoxicillin-clavulanate (AUGMENTIN) 875-125 MG tablet; Take 1 tablet by mouth 2 (two) times daily. -     HYDROcodone-acetaminophen (NORCO) 5-325 MG tablet; Take 1 tablet by mouth every 6 (six) hours as needed.

## 2019-11-06 ENCOUNTER — Telehealth: Payer: Self-pay | Admitting: Medical

## 2019-11-06 NOTE — Telephone Encounter (Signed)
Glad to hear. 

## 2019-11-06 NOTE — Telephone Encounter (Signed)
Pt called and wanted to inform [name] that he is improving on the medication he was given. Pt can be reached at [phone].

## 2019-12-30 ENCOUNTER — Telehealth: Payer: Self-pay | Admitting: Medical

## 2019-12-30 ENCOUNTER — Other Ambulatory Visit: Payer: Self-pay | Admitting: Medical

## 2019-12-30 MED ORDER — ONDANSETRON HCL 4 MG PO TABS: 4.0000 mg | ORAL_TABLET | Freq: Every day | ORAL | 1 refills | Status: DC | PRN

## 2019-12-30 NOTE — Telephone Encounter (Signed)
Pt called and states that symptoms have returned. He is unable to keep anything down. Please advise pt at [phone]. pt uses [facility] [address]

## 2019-12-30 NOTE — Telephone Encounter (Signed)
I sent Zofran medicine for nausea and vomiting.  I would recommend a virtual consult.  However get more symptom information to see if he could actually come in for in person visit.  I do not know if he is referring to this being an infection or cold symptoms or not?

## 2019-12-30 NOTE — Telephone Encounter (Signed)
Ok, yes lets get in for in person visit.

## 2019-12-30 NOTE — Telephone Encounter (Signed)
Patient stated stool is dark and he has been eating fish and has to have a bowel movement instantly after eating. He stated he has abdomen pain like he had before. Would you like this to be in office or virtual? He stated left side of abdomen feels heavy when leaning/bending over.

## 2019-12-31 ENCOUNTER — Emergency Department (HOSPITAL_COMMUNITY)
Admission: EM | Admit: 2019-12-31 | Discharge: 2019-12-31 | Disposition: A | Discharge status: Home / Self Care | Payer: Medicare Other | Attending: Emergency Medicine | Admitting: Emergency Medicine

## 2019-12-31 ENCOUNTER — Emergency Department (HOSPITAL_COMMUNITY): Payer: Medicare Other

## 2019-12-31 ENCOUNTER — Other Ambulatory Visit: Payer: Self-pay

## 2019-12-31 DIAGNOSIS — E1165 Type 2 diabetes mellitus with hyperglycemia: Secondary | ICD-10-CM | POA: Diagnosis not present

## 2019-12-31 DIAGNOSIS — E1122 Type 2 diabetes mellitus with diabetic chronic kidney disease: Secondary | ICD-10-CM | POA: Diagnosis not present

## 2019-12-31 DIAGNOSIS — N183 Chronic kidney disease, stage 3 unspecified: Secondary | ICD-10-CM | POA: Diagnosis not present

## 2019-12-31 DIAGNOSIS — Z794 Long term (current) use of insulin: Secondary | ICD-10-CM | POA: Diagnosis not present

## 2019-12-31 DIAGNOSIS — M25552 Pain in left hip: Secondary | ICD-10-CM

## 2019-12-31 DIAGNOSIS — Y9389 Activity, other specified: Secondary | ICD-10-CM | POA: Diagnosis not present

## 2019-12-31 DIAGNOSIS — S3992XA Unspecified injury of lower back, initial encounter: Secondary | ICD-10-CM | POA: Diagnosis not present

## 2019-12-31 DIAGNOSIS — S3993XA Unspecified injury of pelvis, initial encounter: Secondary | ICD-10-CM | POA: Diagnosis not present

## 2019-12-31 DIAGNOSIS — I129 Hypertensive chronic kidney disease with stage 1 through stage 4 chronic kidney disease, or unspecified chronic kidney disease: Secondary | ICD-10-CM | POA: Diagnosis not present

## 2019-12-31 DIAGNOSIS — W19XXXA Unspecified fall, initial encounter: Secondary | ICD-10-CM | POA: Diagnosis not present

## 2019-12-31 DIAGNOSIS — Z7982 Long term (current) use of aspirin: Secondary | ICD-10-CM | POA: Diagnosis not present

## 2019-12-31 DIAGNOSIS — W108XXA Fall (on) (from) other stairs and steps, initial encounter: Secondary | ICD-10-CM | POA: Diagnosis not present

## 2019-12-31 DIAGNOSIS — Z79899 Other long term (current) drug therapy: Secondary | ICD-10-CM | POA: Insufficient documentation

## 2019-12-31 DIAGNOSIS — Y92511 Restaurant or cafe as the place of occurrence of the external cause: Secondary | ICD-10-CM | POA: Insufficient documentation

## 2019-12-31 DIAGNOSIS — Y999 Unspecified external cause status: Secondary | ICD-10-CM | POA: Insufficient documentation

## 2019-12-31 DIAGNOSIS — I10 Essential (primary) hypertension: Secondary | ICD-10-CM | POA: Diagnosis not present

## 2019-12-31 LAB — COMPREHENSIVE METABOLIC PANEL
ALT: 25 U/L (ref 0–44)
AST: 22 U/L (ref 15–41)
Albumin: 4.1 g/dL (ref 3.5–5.0)
Alkaline Phosphatase: 148 U/L — ABNORMAL HIGH (ref 38–126)
Anion gap: 10 (ref 5–15)
BUN: 23 mg/dL — ABNORMAL HIGH (ref 6–20)
CO2: 23 mmol/L (ref 22–32)
Calcium: 9.3 mg/dL (ref 8.9–10.3)
Chloride: 102 mmol/L (ref 98–111)
Creatinine, Ser: 1.58 mg/dL — ABNORMAL HIGH (ref 0.61–1.24)
GFR calc Af Amer: 56 mL/min — ABNORMAL LOW (ref >60)
GFR calc non Af Amer: 48 mL/min — ABNORMAL LOW (ref >60)
Glucose, Bld: 496 mg/dL — ABNORMAL HIGH (ref 70–99)
Potassium: 4.3 mmol/L (ref 3.5–5.1)
Sodium: 135 mmol/L (ref 135–145)
Total Bilirubin: 0.9 mg/dL (ref 0.3–1.2)
Total Protein: 7.7 g/dL (ref 6.5–8.1)

## 2019-12-31 LAB — URINALYSIS, ROUTINE W REFLEX MICROSCOPIC
Bacteria, UA: NONE SEEN
Bilirubin Urine: NEGATIVE
Glucose, UA: >=500 mg/dL — AB
Hgb urine dipstick: NEGATIVE
Ketones, ur: NEGATIVE mg/dL
Leukocytes,Ua: NEGATIVE
Nitrite: NEGATIVE
Protein, ur: >=300 mg/dL — AB
Specific Gravity, Urine: 1.025 (ref 1.005–1.030)
pH: 5 (ref 5.0–8.0)

## 2019-12-31 LAB — CBC
HCT: 35.9 % — ABNORMAL LOW (ref 39.0–52.0)
Hemoglobin: 11.9 g/dL — ABNORMAL LOW (ref 13.0–17.0)
MCH: 29.6 pg (ref 26.0–34.0)
MCHC: 33.1 g/dL (ref 30.0–36.0)
MCV: 89.3 fL (ref 80.0–100.0)
Platelets: 201 10*3/uL (ref 150–400)
RBC: 4.02 MIL/uL — ABNORMAL LOW (ref 4.22–5.81)
RDW: 11.4 % — ABNORMAL LOW (ref 11.5–15.5)
WBC: 7.2 10*3/uL (ref 4.0–10.5)
nRBC: 0 % (ref 0.0–0.2)

## 2019-12-31 LAB — CBG MONITORING, ED
Glucose-Capillary: 472 mg/dL — ABNORMAL HIGH (ref 70–99)
Glucose-Capillary: 368 mg/dL — ABNORMAL HIGH (ref 70–99)

## 2019-12-31 MED ORDER — INSULIN ASPART PROT & ASPART (70-30 MIX) 100 UNIT/ML ~~LOC~~ SUSP
10.0000 [IU] | Freq: Once | SUBCUTANEOUS | Status: DC
  Filled 2019-12-31: qty 10

## 2019-12-31 MED ORDER — INSULIN ASPART 100 UNIT/ML ~~LOC~~ SOLN
10.0000 [IU] | Freq: Once | SUBCUTANEOUS | Status: AC
  Administered 2019-12-31: 10 [IU] via SUBCUTANEOUS
  Filled 2019-12-31: qty 0.1

## 2019-12-31 MED ORDER — AMLODIPINE BESYLATE 5 MG PO TABS
10.0000 mg | ORAL_TABLET | Freq: Once | ORAL | Status: AC
  Administered 2019-12-31: 10 mg via ORAL
  Filled 2019-12-31: qty 2

## 2019-12-31 MED ORDER — CLONIDINE HCL 0.1 MG PO TABS
0.1000 mg | ORAL_TABLET | Freq: Once | ORAL | Status: AC
  Administered 2019-12-31: 0.1 mg via ORAL
  Filled 2019-12-31: qty 1

## 2019-12-31 MED ORDER — ACETAMINOPHEN 500 MG PO TABS
1000.0000 mg | ORAL_TABLET | Freq: Once | ORAL | Status: AC
  Administered 2019-12-31: 1000 mg via ORAL
  Filled 2019-12-31: qty 2

## 2019-12-31 NOTE — Discharge Instructions (Addendum)
1.  All instructions for contusions and strains regarding your fall.  If you have any new or changing weakness or numbness to your leg or change or increasing difficulty with walking, return to the emergency department.  You may take Tylenol for pain and apply cool packs for any swelling. 2.  You need to work with your doctor to make sure you are appropriately taking your insulin and monitoring your blood sugars.  Your blood sugar is elevated in the emergency department.  You reported not taking your Lantus today because you were concerned that your blood sugar might be low.  This needs to be addressed as soon as possible.  If you are not taking your insulin doses, you risk getting severely elevated blood sugar and having complications from that.  Call your doctor tomorrow to discuss ongoing plan.  Take your Lantus in the morning as you usually would.

## 2019-12-31 NOTE — Progress Notes (Signed)
Patient ambulated with cane down the hall and tolerated fairly well.

## 2019-12-31 NOTE — Telephone Encounter (Signed)
Message sent via my chart

## 2019-12-31 NOTE — ED Triage Notes (Signed)
Per EMS, patient is diabetic, was at mall today, stumbled while getting off escalator at mall today. C/o midline back pain. Stroke screen negative. A/o x4. Hyperglycemic per EMS.

## 2019-12-31 NOTE — ED Notes (Signed)
IV start attempted x2.  unsuccessful

## 2019-12-31 NOTE — ED Provider Notes (Signed)
[facility] DEPT Provider Note   CSN: [id number] Arrival date & time: 12/31/19  1550     History No chief complaint on file.   Brandon [name] is a 59 y.o. male.  HPI Patient reports he left his home this morning and felt pretty well.  He wanted to start getting more exercise. He walked to the bus stop and went to the mall.  Reports he was walking around and he thought that he needed to get something to eat B/C thought blood sugar may be low so he had a [employer] coffee [employer] and a brownie.  Patient did not take his Lantus this morning because he felt slightly lightheaded and thought maybe it was from low blood sugar.  He reports his CBG machine does not seem to be working correctly, so he is not able to accurately monitor his blood sugars in response to his insulin dosing.  He went up the escalator stairs and at the top somehow got his left knee or leg caught.  He fell to the floor.  He was not caught or dragged on the stairs and did not fall back down any stairs.  He reports he already has some weakness of the left lower extremity from previous stroke and he wears a knee brace because the knee does tend to buckle.  He could not get back up again and required assistance from several people.  EMS was called and he was transported.  He reports he has discomfort around his left flank area and buttock and hip.  He reports this kind of a generalized uncomfortable feelings were like when he had his kidney cancer.  (EMR review indicates patient has been having some problems with left lower abdominal pain and suspected diverticulitis.  He was given a prescription of Augmentin at the end of November.  He ostensibly improved but then yesterday had called his PCP with some increasing symptoms again).  He does not perceive that there is any additional weakness or numbness of the left lower extremity as compared to baseline.  Patient cannot really remember if he hit his head, he  does not think so.  He reports the fall happened so quickly.  No headache.     Past Medical History:  Diagnosis Date   Acute ischemic stroke (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   left and right; cryoablation bil   Depression    BH Adm in [place] Depression   Diabetes mellitus    DKA prior hospitalization   Diverticulitis    s/p micorperforation Sept 2012-managed conservatively by Gen surgery   ED (erectile dysfunction)    Focal seizure ([place]) 11/2013   due to ischemic stroke   GERD (gastroesophageal reflux disease)    Hiatal hernia    Hyperlipidemia    Hypertension    Kidney tumor 09/2011   Renal cell CA   Seizures ([place])    none since 2016, taking Keppra - maw   Sleep apnea    Thyroid disease    "weak thyroid" per MD   Wears glasses     Patient Active Problem List   Diagnosis Date Noted   Sinus congestion 10/30/2019   Dark stools 10/30/2019   History of diverticulitis 10/30/2019   Cough 06/10/2019   Noncompliance 06/10/2019   Anemia 04/24/2018   History of renal cell cancer 04/24/2018   Erectile dysfunction 04/24/2018   Weight  gain 03/15/2018   Edema 03/15/2018   Seizure disorder ([place]) 03/01/2018   Protein-calorie malnutrition, severe 02/03/2018   ARF (acute renal failure) ([place]) 02/02/2018   History of stroke 11/29/2017   Obstructive sleep apnea 11/29/2017   Acute renal failure ([place]) 05/23/2017   Nausea and vomiting 05/23/2017   Acute renal failure superimposed on stage 3 chronic kidney disease ([place]) 05/10/2017   Uncontrolled diabetes mellitus ([place]) 05/10/2017   Dyslipidemia associated with type 2 diabetes mellitus ([place]) 05/10/2017   Essential hypertension 05/10/2017   Diverticulitis 05/10/2017   History of cerebrovascular accident (CVA) with residual deficit 05/10/2017   Diabetic ulcer of left foot associated with secondary diabetes mellitus ([place]) 04/21/2016    History of seizure 01/05/2016   Depression, recurrent ([place]) 09/21/2014   Acute ischemic stroke ([place]) 11/16/2013   Renal cell cancer ([place]) 12/30/2011   Abdominal pain 12/30/2011    Past Surgical History:  Procedure Laterality Date   COLONOSCOPY     IR RADIOLOGIST EVAL & MGMT  04/04/2017   KIDNEY SURGERY     ablation of renal cell CA - 12/28, prior one was October [place]   TEE WITHOUT CARDIOVERSION N/A 11/19/2013   Procedure: TRANSESOPHAGEAL ECHOCARDIOGRAM (TEE);  Surgeon: Lelon Perla, MD;  Location: [facility] ENDOSCOPY;  Service: Cardiovascular;  Laterality: N/A;       Family History  Problem Relation Age of Onset   Diabetes Father    Prostate cancer Other    Stomach cancer Other    Colon cancer Neg Hx    Esophageal cancer Neg Hx    Rectal cancer Neg Hx     Social History   Tobacco Use   Smoking status: Never Smoker   Smokeless tobacco: Never Used  Substance Use Topics   Alcohol use: Not Currently    Comment: rarely   Drug use: No    Home Medications Prior to Admission medications   Medication Sig Start Date End Date Taking? Authorizing Provider  amLODipine (NORVASC) 5 MG tablet Take 1 tablet (5 mg total) by mouth daily. 07/05/19 07/04/20 Yes Tysinger, Camelia Eng, PA-C  aspirin 325 MG EC tablet Take 325 mg by mouth daily.   Yes [provider]  atorvastatin (LIPITOR) 40 MG tablet Take 1 tablet (40 mg total) by mouth daily. 07/05/19  Yes Tysinger, Camelia Eng, PA-C  Insulin Glargine (LANTUS SOLOSTAR) 100 UNIT/ML Solostar Pen Inject 60 Units into the skin daily. 07/05/19  Yes Tysinger, Camelia Eng, PA-C  levETIRAcetam (KEPPRA) 500 MG tablet Take 2 tablets (1,000 mg total) by mouth daily. Patient taking differently: Take 500 mg by mouth daily.  03/05/18  Yes Dennie Bible, NP  QUEtiapine (SEROQUEL) 25 MG tablet Take 1 tablet (25 mg total) by mouth daily. 07/05/19  Yes Tysinger, Camelia Eng, PA-C  sertraline (ZOLOFT) 100 MG tablet Take 1 tablet (100 mg  total) by mouth daily. 07/05/19  Yes Tysinger, Camelia Eng, PA-C  amoxicillin-clavulanate (AUGMENTIN) 875-125 MG tablet Take 1 tablet by mouth 2 (two) times daily. Patient not taking: Reported on 12/31/2019 10/30/19   Tysinger, Camelia Eng, PA-C  aspirin EC 81 MG tablet Take 1 tablet (81 mg total) by mouth daily. Patient not taking: Reported on 12/31/2019 07/05/19   Tysinger, Camelia Eng, PA-C  blood glucose meter kit and supplies Dispense based on patient and insurance preference. Use up to four times daily as directed. (FOR ICD-9 250.00, 250.01). 04/24/16   Eugenie Filler, MD  gabapentin (NEURONTIN) 300 MG capsule Take 1 capsule (300 mg total) by  mouth at bedtime. For 2 weeks. Then take 300 mg by mouth twice a day thereafter as needed Patient not taking: Reported on 12/31/2019 04/26/18   Yetta Flock, MD  glucose blood (ACCU-CHEK ACTIVE STRIPS) test strip Use as instructed 06/11/19   Tysinger, Camelia Eng, PA-C  HYDROcodone-acetaminophen (NORCO) 5-325 MG tablet Take 1 tablet by mouth every 6 (six) hours as needed. Patient not taking: Reported on 12/31/2019 10/30/19   Tysinger, Camelia Eng, PA-C  insulin aspart protamine- aspart (NOVOLOG MIX 70/30) (70-30) 100 UNIT/ML injection Inject 0-0.15 mLs (0-15 Units total) into the skin 3 (three) times daily. Sliding scale Patient not taking: Reported on 12/31/2019 07/05/19   Tysinger, Camelia Eng, PA-C  Insulin Pen Needle (PEN NEEDLES) 32G X 4 MM MISC 1 each by Does not apply route daily. Use for insulin pens 07/05/19   Tysinger, Camelia Eng, PA-C  Lancets (ACCU-CHEK SOFT TOUCH) lancets Use as instructed 06/11/19   Tysinger, Camelia Eng, PA-C  losartan-hydrochlorothiazide (HYZAAR) 50-12.5 MG tablet TAKE 1 TABLET BY MOUTH EVERY DAY Patient not taking: Reported on 12/31/2019 10/02/19   Tysinger, Camelia Eng, PA-C  ondansetron (ZOFRAN) 4 MG tablet Take 1 tablet (4 mg total) by mouth daily as needed for nausea or vomiting. 12/30/19 12/29/20  Tysinger, Camelia Eng, PA-C  OZEMPIC, 1 MG/DOSE, 2 MG/1.5ML  SOPN INJECT 2 MG INTO THE SKIN ONCE A WEEK. 10/21/19   Tysinger, Camelia Eng, PA-C    Allergies    Morphine  Review of Systems   Review of Systems 10 Systems reviewed and are negative for acute change except as noted in the HPI.  Physical Exam Updated Vital Signs BP 136/86    Pulse 67    Temp 97.9 F (36.6 C) (Oral)    Resp 16    SpO2 100%   Physical Exam Constitutional:      Comments: Patient is alert and interactive.  He is not in any acute distress.  No respiratory distress.  HENT:     Head: Normocephalic and atraumatic.  Eyes:     Extraocular Movements: Extraocular movements intact.  Cardiovascular:     Rate and Rhythm: Normal rate and regular rhythm.  Pulmonary:     Effort: Pulmonary effort is normal.     Breath sounds: Normal breath sounds.  Abdominal:     Comments: Abdomen is soft.  Patient endorses some discomfort to palpation along the left lateral abdomen lower abdomen.  There are no contusions or abrasions present.  Musculoskeletal:     Comments: Visual inspection of the back is normal no contusions abrasions or ecchymosis to the back or flank.  Patient endorses tender to palp patient of the sacrum and coccyx.  Patient's lower extremities are symmetrically aligned.  Patient is wearing a removable brace on the left knee.  No contusions or abrasions to the lower extremities.  No peripheral edema.  Skin:    General: Skin is warm and dry.  Neurological:     Comments: Patient reports prior left-sided stroke.  He is alert and interactive.  Speech is appropriate and shows intact recall.  Some possible mild cognitive delay from old stroke.  Patient follows commands without difficulty, uses both upper extremities to pull himself forward in the stretcher.  He can move both lower extremities.  Reports pre-existing left lower extremity weakness.  No new weakness or dysfunction identified.  Psychiatric:        Mood and Affect: Mood normal.     ED Results / Procedures / Treatments  Labs (all labs ordered are listed, but only abnormal results are displayed) Labs Reviewed  COMPREHENSIVE METABOLIC PANEL - Abnormal; Notable for the following components:      Result Value   Glucose, Bld 496 (*)    BUN 23 (*)    Creatinine, Ser 1.58 (*)    Alkaline Phosphatase 148 (*)    GFR calc non Af Amer 48 (*)    GFR calc Af Amer 56 (*)    All other components within normal limits  CBC - Abnormal; Notable for the following components:   RBC 4.02 (*)    Hemoglobin 11.9 (*)    HCT 35.9 (*)    RDW 11.4 (*)    All other components within normal limits  URINALYSIS, ROUTINE W REFLEX MICROSCOPIC - Abnormal; Notable for the following components:   Glucose, UA >=500 (*)    Protein, ur >=300 (*)    All other components within normal limits  CBG MONITORING, ED - Abnormal; Notable for the following components:   Glucose-Capillary 472 (*)    All other components within normal limits  CBG MONITORING, ED - Abnormal; Notable for the following components:   Glucose-Capillary 368 (*)    All other components within normal limits    EKG None  Radiology DG Lumbar Spine 2-3 Views  Result Date: 12/31/2019 CLINICAL DATA:  Fall EXAM: LUMBAR SPINE - 2-3 VIEW COMPARISON:  CT 02/05/2018 FINDINGS: Five non rib-bearing lumbar type vertebra. Coarse calcification in the left upper quadrant, corresponding to calcified kidney lesion. Sagittal alignment is within normal limits. Vertebral body heights are maintained. Bulky flowing anterior osteophytes L2 through L5. Moderate disc space narrowing at L5-S1 with moderate degenerative changes at L1-L2 and L2-L3. IMPRESSION: Degenerative changes.  No acute osseous abnormality. Electronically Signed   By: Donavan Foil M.D.   On: 12/31/2019 17:53   DG Pelvis 1-2 Views  Result Date: 12/31/2019 CLINICAL DATA:  Fall EXAM: PELVIS - 1-2 VIEW COMPARISON:  03/31/2017 FINDINGS: SI joints are non widened. Pubic symphysis and rami are intact. No fracture or dislocation.  Moderate arthritis of both hips with prominent lateral acetabular osteophytes. Bilateral acetabular cyst. IMPRESSION: No acute osseous abnormality Electronically Signed   By: Donavan Foil M.D.   On: 12/31/2019 17:50    Procedures Procedures (including critical care time)  Medications Ordered in ED Medications  acetaminophen (TYLENOL) tablet 1,000 mg (1,000 mg Oral Given 12/31/19 1849)  amLODipine (NORVASC) tablet 10 mg (10 mg Oral Given 12/31/19 1856)  cloNIDine (CATAPRES) tablet 0.1 mg (0.1 mg Oral Given 12/31/19 1856)  insulin aspart (novoLOG) injection 10 Units (10 Units Subcutaneous Given 12/31/19 1925)    ED Course  I have reviewed the triage vital signs and the nursing notes.  Pertinent labs & imaging results that were available during my care of the patient were reviewed by me and considered in my medical decision making (see chart for details).    MDM Rules/Calculators/A&P                     Recheck: Patient is alert and in no distress.  He is comfortable appearance.  He reports the Tylenol helped with pain in his left side.  He has been given a dose of NovoLog.  Patient had not taken any other insulin today.  We will plan to gait test the patient for baseline ambulation.  Will give snack or meal.  Anticipate patient will be stable for discharge.  No fractures identified on x-ray.  Patient is  discomfort is most consistent with low back strain and contusion.  Doubt intraabdominal or neurologic injury.  Patient is comfortable after Tylenol.  Return precautions reviewed and a follow-up plan discussed.  Patient is advised that he must speak with his family doctor about his concerns for his CBG monitor being inaccurate.  He is not taking his Lantus due to feeling that he might be hypoglycemic.  We have discussed this in detail and patient advises he will establish follow-up to try to address this. Final Clinical Impression(s) / ED Diagnoses Final diagnoses:  Fall (on) (from) other  stairs and steps, initial encounter  Hyperglycemia due to diabetes mellitus ([place])  Left hip pain    Rx / DC Orders ED Discharge Orders    None       Charlesetta Shanks, MD 12/31/19 2131

## 2020-01-06 ENCOUNTER — Ambulatory Visit: Payer: Medicare Other | Admitting: Medical

## 2020-01-09 ENCOUNTER — Encounter: Payer: Self-pay | Admitting: Medical

## 2020-01-09 ENCOUNTER — Ambulatory Visit (INDEPENDENT_AMBULATORY_CARE_PROVIDER_SITE_OTHER): Payer: Medicare Other | Admitting: Medical

## 2020-01-09 ENCOUNTER — Other Ambulatory Visit: Payer: Self-pay

## 2020-01-09 VITALS — BP 148/90 | HR 75 | Temp 97.8°F | Ht 71.0 in | Wt 265.0 lb

## 2020-01-09 DIAGNOSIS — I6930 Unspecified sequelae of cerebral infarction: Secondary | ICD-10-CM

## 2020-01-09 DIAGNOSIS — Z91199 Patient's noncompliance with other medical treatment and regimen due to unspecified reason: Secondary | ICD-10-CM

## 2020-01-09 DIAGNOSIS — Z8673 Personal history of transient ischemic attack (TIA), and cerebral infarction without residual deficits: Secondary | ICD-10-CM | POA: Diagnosis not present

## 2020-01-09 DIAGNOSIS — R159 Full incontinence of feces: Secondary | ICD-10-CM

## 2020-01-09 DIAGNOSIS — Z87898 Personal history of other specified conditions: Secondary | ICD-10-CM

## 2020-01-09 DIAGNOSIS — F339 Major depressive disorder, recurrent, unspecified: Secondary | ICD-10-CM

## 2020-01-09 DIAGNOSIS — M25562 Pain in left knee: Secondary | ICD-10-CM

## 2020-01-09 DIAGNOSIS — I10 Essential (primary) hypertension: Secondary | ICD-10-CM | POA: Diagnosis not present

## 2020-01-09 DIAGNOSIS — R609 Edema, unspecified: Secondary | ICD-10-CM | POA: Diagnosis not present

## 2020-01-09 DIAGNOSIS — N1832 Chronic kidney disease, stage 3b: Secondary | ICD-10-CM

## 2020-01-09 DIAGNOSIS — Z85528 Personal history of other malignant neoplasm of kidney: Secondary | ICD-10-CM | POA: Diagnosis not present

## 2020-01-09 DIAGNOSIS — E0865 Diabetes mellitus due to underlying condition with hyperglycemia: Secondary | ICD-10-CM

## 2020-01-09 DIAGNOSIS — E785 Hyperlipidemia, unspecified: Secondary | ICD-10-CM | POA: Diagnosis not present

## 2020-01-09 DIAGNOSIS — Z9119 Patient's noncompliance with other medical treatment and regimen: Secondary | ICD-10-CM | POA: Diagnosis not present

## 2020-01-09 DIAGNOSIS — M79605 Pain in left leg: Secondary | ICD-10-CM

## 2020-01-09 DIAGNOSIS — E1169 Type 2 diabetes mellitus with other specified complication: Secondary | ICD-10-CM

## 2020-01-09 DIAGNOSIS — W19XXXA Unspecified fall, initial encounter: Secondary | ICD-10-CM

## 2020-01-09 DIAGNOSIS — G8929 Other chronic pain: Secondary | ICD-10-CM

## 2020-01-09 MED ORDER — ONDANSETRON HCL 4 MG PO TABS: 4.0000 mg | ORAL_TABLET | Freq: Every day | ORAL | 1 refills | Status: DC | PRN

## 2020-01-09 MED ORDER — LANTUS SOLOSTAR 100 UNIT/ML ~~LOC~~ SOPN: 60.0000 [IU] | PEN_INJECTOR | Freq: Every day | SUBCUTANEOUS | 3 refills | Status: DC

## 2020-01-09 MED ORDER — AMLODIPINE-OLMESARTAN 10-20 MG PO TABS: 1.0000 | ORAL_TABLET | Freq: Every day | ORAL | 2 refills | Status: DC

## 2020-01-09 MED ORDER — OZEMPIC (1 MG/DOSE) 2 MG/1.5ML ~~LOC~~ SOPN: 2.0000 mg | PEN_INJECTOR | SUBCUTANEOUS | 3 refills | Status: DC

## 2020-01-09 MED ORDER — ASPIRIN EC 81 MG PO TBEC: 81.0000 mg | DELAYED_RELEASE_TABLET | Freq: Every day | ORAL | 3 refills | Status: DC

## 2020-01-09 MED ORDER — INSULIN ASPART 100 UNIT/ML ~~LOC~~ SOLN: 5.0000 [IU] | Freq: Three times a day (TID) | SUBCUTANEOUS | 2 refills | Status: DC

## 2020-01-09 NOTE — Patient Instructions (Signed)
Diabetes  Continue Ozempic weekly shot  Continue with Lantus 60 units daily at bedtime  Begin back on mealtime insulin NovoLog 5 units per meal  Do not skip meals, eat 3 meals daily  You need to be checking your blood sugars fasting in the morning and before meals.  Need to check your sugars daily  We are going to refer you back to endocrinology as you should be seeing a diabetes specialist   High blood pressure  Your blood pressure is not controlled  I would like to change you to Amlodipine telmisartan 10/20 mg 1 tablet daily.  If this is covered by insurance and start this tomorrow but stop the amlodipine 5 mg daily  I am going to refer you back to cardiology as you have not had a heart doctor follow-up in quite a while.  Having diabetes puts you at risk for heart disease so it is time for an updated eval   Stool incontinence  I recommend you avoid greasy foods, large portions, fried foods, spicy foods, hot sauce  Consider taking a probiotic daily.  We gave you samples of probiotic today  We will refer you to gastroenterology due to this issue   Consider follow-up with your orthopedist given your knee issues.  I believe your leg pains will gradually improve over the next few weeks but you did bruise yourself with a fall

## 2020-01-09 NOTE — Progress Notes (Signed)
Subjective: Chief Complaint  Patient presents with  . Follow-up   Here for emergency dept follow up.    He was seen recently emergency department after [place] the [place].  He somehow got his leg caught in the escalator and fell out going up the escalator.  Bystanders called EMS as he required assistance from several people.  He notes that he was trying to get more exercise, which is why he went to the mall to do some walking.  He has a history of weakness of leg from prior stroke in general.  At the emergency department he was reporting some abdominal discomfort, low back pain.  He reported to the emergency department that his glucometer is not working properly and he has not been taking his Lantus due to feeling low sugars.  I reviewed the emergency department results from 12/31/2019.  His blood sugar was 496, hemoglobin is slightly decreased  Diabetes-he reports compliance with Ozempic weekly.  He notes he has been using 65 u lantus daily although ED reports stated he told them he wasn't taking the Lantus.  He hasn't had the 70/30 insulin mix Novolog for a while, so hasn't been using this.   The last time he was regularly checking sugars was several months ago.   He also states today that he does not have a working meter currently.  He is supposed to be seeing endocrinology but has not seen him since 2018.  Hypertension-he reports compliance with amlodipine 5 mg daily.  110m Amlodipine was making him feel dizzy.   Had been on ACE/ARB in the past but with renal failure in 01/2018 this was stopped.    He had a bruis on left leg and hip from the fall at the mall.    Feels some tension in the muscle when he extends his left leg.    Sometimes having trouble controlling bowels, has soiled him self occasionally for last few months.      Past Medical History:  Diagnosis Date  . [place] ischemic [place]  . Allergy   . Anxiety   . Arthritis   . 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 Depression    [facility] [place]  . Diabetes mellitus    DKA prior hospitalization  . Diverticulitis    s/p micorperforation Sept 2012-managed conservatively by Gen surgery  . ED (erectile dysfunction)   . [place]   due to ischemic stroke  . GERD (gastroesophageal reflux disease)   . Hiatal hernia   . Hyperlipidemia   . Hypertension   . Kidney tumor 09/2011   Renal cell CA  . Seizures ([place]    none since 2016, taking Keppra - maw  . Sleep apnea   . Thyroid disease    "weak thyroid" per MD  . Wears glasses    Current Outpatient Medications on File Prior to Visit  Medication Sig Dispense Refill  . atorvastatin (LIPITOR) 40 MG tablet Take 1 tablet (40 mg total) by mouth daily. 90 tablet 3  . glucose blood (ACCU-CHEK ACTIVE STRIPS) test strip Use as instructed 100 each 12  . Insulin Pen Needle (PEN NEEDLES) 32G X 4 MM MISC 1 each by Does not apply route daily. Use for insulin pens 200 each 2  . Lancets (ACCU-CHEK SOFT TOUCH) lancets Use as instructed 100 each 12  . levETIRAcetam (KEPPRA) 500 MG tablet Take 2 tablets (1,000  mg total) by mouth daily. (Patient taking differently: Take 500 mg by mouth daily. ) 60 tablet 11  . QUEtiapine (SEROQUEL) 25 MG tablet Take 1 tablet (25 mg total) by mouth daily. 30 tablet 1  . sertraline (ZOLOFT) 100 MG tablet Take 1 tablet (100 mg total) by mouth daily. 30 tablet 1  . blood glucose meter kit and supplies Dispense based on patient and insurance preference. Use up to four times daily as directed. (FOR ICD-9 250.00, 250.01). (Patient not taking: Reported on 01/09/2020) 1 each 0   No current facility-administered medications on file prior to visit.   ROS as in subjective     Objective: BP (!) 148/90   Pulse 75   Temp 97.8 F (36.6 C)   Ht 5' 11" (1.803 m)   Wt 265 lb (120.2 kg)   SpO2 98%   BMI 36.96 kg/m   Wt Readings from Last 3 Encounters:  01/09/20 265 lb (120.2  kg)  10/30/19 225 lb (102.1 kg)  07/03/19 268 lb 9.6 oz (121.8 kg)   BP Readings from Last 3 Encounters:  01/09/20 (!) 148/90  12/31/19 136/86  07/03/19 126/88    General appearence: alert, no distress, WD/WN Skin: no bruising or redness Oral cavity: MMM, no lesions Neck: supple, no lymphadenopathy, no thyromegaly, no masses, no bruits Heart: RRR, normal S1, S2, no murmurs Lungs: CTA bilaterally, no wheezes, rhonchi, or rales Abdomen: +bs, soft, non tender, non distended, no masses, no hepatomegaly, no splenomegaly Back: mild tendnerss left lower back paraspinal, mild pain with ROM in same area, tender left lateral thigh and lateral leg throughout, no swelling.  Mild pain noted in left hip with ROM, tender over lateral hip.  Left knee knee tender in general, pain with ROM in general, seems guarded to fully extend left leg, otherwise legs nontender.  Wore a brace on left knee into office today. Ext: no obvious edema Pulses: 2+ symmetric, upper and lower extremities, normal cap refill Neuro: mild tremor of both hands, pill rolling motion with both hands, stuttering, mild weakness left leg Guarded with ambulation due to left leg and back pain.   Psych: pleasant, answers questions appropriately    Assessment: Encounter Diagnoses  Name Primary?  . Diabetes mellitus due to underlying condition, uncontrolled, with hyperglycemia ([place]) Yes  . Essential hypertension   . Noncompliance   . Edema, unspecified type   . History of stroke   . Stage 3b chronic kidney disease   . Incontinence of feces, unspecified fecal incontinence type   . Dyslipidemia associated with type 2 diabetes mellitus ([place])   . Depression, recurrent ([place])   . History of seizure   . History of cerebrovascular accident (CVA) with residual deficit   . History of renal cell cancer   . Fall, initial encounter   . Left leg pain   . Chronic pain of left knee        Plan:  I reviewed his recent emergency department  visit notes.  He has had issues with compliance for years.  He should have never quit seeing endocrinology, but for what ever reason he quit going.  He does not call and let me know when something is not working or if he runs out of medicines or when he is glucometer is not working.  We discussed the need for better follow-up and compliance  Historically we have made referrals there has been numerous times where he did not follow through with the referral.  Today we are  going to once again try to get him back into specialist that he should be seeing  We discussed the following recommendations and changes today.  Another challenge will be whether the insurance will cover these changes below    Patient Instructions  Diabetes  Continue Ozempic weekly shot  Continue with Lantus 60 units daily at bedtime  Begin back on mealtime insulin NovoLog 5 units per meal  Do not skip meals, eat 3 meals daily  You need to be checking your blood sugars fasting in the morning and before meals.  Need to check your sugars daily  We are going to refer you back to endocrinology as you should be seeing a diabetes specialist   High blood pressure  Your blood pressure is not controlled  I would like to change you to Amlodipine telmisartan 10/20 mg 1 tablet daily.  If this is covered by insurance and start this tomorrow but stop the amlodipine 5 mg daily  I am going to refer you back to cardiology as you have not had a heart doctor follow-up in quite a while.  Having diabetes puts you at risk for heart disease so it is time for an updated eval   Stool incontinence  I recommend you avoid greasy foods, large portions, fried foods, spicy foods, hot sauce  Consider taking a probiotic daily.  We gave you samples of probiotic today  We will refer you to gastroenterology due to this issue   Consider follow-up with your orthopedist given your knee issues.  I believe your leg pains will gradually improve  over the next few weeks but you did bruise yourself with a fall      Bentzion was seen today for follow-up.  Diagnoses and all orders for this visit:  Diabetes mellitus due to underlying condition, uncontrolled, with hyperglycemia ([place]) -     Ambulatory referral to Endocrinology -     Hemoglobin A1c -     Microalbumin/Creatinine Ratio, Urine  Essential hypertension -     Ambulatory referral to Cardiology -     Hemoglobin A1c  Noncompliance  Edema, unspecified type  History of stroke  Stage 3b chronic kidney disease  Incontinence of feces, unspecified fecal incontinence type -     Ambulatory referral to Gastroenterology  Dyslipidemia associated with type 2 diabetes mellitus ([place]) -     Lipid panel  Depression, recurrent ([place])  History of seizure  History of cerebrovascular accident (CVA) with residual deficit  History of renal cell cancer  Fall, initial encounter  Left leg pain  Chronic pain of left knee  Other orders -     amlodipine-olmesartan (AZOR) 10-20 MG tablet; Take 1 tablet by mouth daily. -     aspirin EC 81 MG tablet; Take 1 tablet (81 mg total) by mouth daily. -     Semaglutide, 1 MG/DOSE, (OZEMPIC, 1 MG/DOSE,) 2 MG/1.5ML SOPN; Inject 2 mg into the skin once a week. -     ondansetron (ZOFRAN) 4 MG tablet; Take 1 tablet (4 mg total) by mouth daily as needed for nausea or vomiting. -     Insulin Glargine (LANTUS SOLOSTAR) 100 UNIT/ML Solostar Pen; Inject 60 Units into the skin daily. -     insulin aspart (NOVOLOG) 100 UNIT/ML injection; Inject 5 Units into the skin 3 (three) times daily before meals.

## 2020-01-10 ENCOUNTER — Encounter: Payer: Self-pay | Admitting: Internal Medicine

## 2020-01-10 LAB — HEMOGLOBIN A1C
Est. average glucose Bld gHb Est-mCnc: 289 mg/dL
Hgb A1c MFr Bld: 11.7 % — ABNORMAL HIGH (ref 4.8–5.6)

## 2020-01-10 LAB — LIPID PANEL
Chol/HDL Ratio: 4.1 ratio (ref 0.0–5.0)
Cholesterol, Total: 162 mg/dL (ref 100–199)
HDL: 40 mg/dL (ref >39)
LDL Chol Calc (NIH): 107 mg/dL — ABNORMAL HIGH (ref 0–99)
Triglycerides: 79 mg/dL (ref 0–149)
VLDL Cholesterol Cal: 15 mg/dL (ref 5–40)

## 2020-01-10 LAB — MICROALBUMIN / CREATININE URINE RATIO
Creatinine, Urine: 163.1 mg/dL
Microalb/Creat Ratio: 1498 mg/g creat — ABNORMAL HIGH (ref 0–29)
Microalbumin, Urine: 2443.6 ug/mL

## 2020-01-16 ENCOUNTER — Other Ambulatory Visit: Payer: Self-pay

## 2020-01-16 ENCOUNTER — Telehealth: Payer: Self-pay | Admitting: Medical

## 2020-01-16 NOTE — Telephone Encounter (Signed)
Pt called and states that he needs  A RX for glucose strip for accu check please send to the [id number] - [place], [place]

## 2020-01-17 ENCOUNTER — Other Ambulatory Visit: Payer: Self-pay

## 2020-01-17 DIAGNOSIS — E0865 Diabetes mellitus due to underlying condition with hyperglycemia: Secondary | ICD-10-CM

## 2020-01-17 MED ORDER — GLUCOSE BLOOD VI STRP: ORAL_STRIP | 12 refills | Status: DC

## 2020-01-17 NOTE — Telephone Encounter (Signed)
Strips has been sent

## 2020-01-23 ENCOUNTER — Ambulatory Visit: Payer: Self-pay | Admitting: Cardiology

## 2020-01-27 ENCOUNTER — Ambulatory Visit: Payer: Medicare Other | Admitting: Cardiology

## 2020-01-27 ENCOUNTER — Encounter: Payer: Self-pay | Admitting: Cardiology

## 2020-01-27 ENCOUNTER — Other Ambulatory Visit: Payer: Self-pay

## 2020-01-27 VITALS — BP 152/98 | HR 74 | Temp 98.2°F | Resp 16 | Ht 71.0 in | Wt 281.9 lb

## 2020-01-27 DIAGNOSIS — N1831 Chronic kidney disease, stage 3a: Secondary | ICD-10-CM | POA: Diagnosis not present

## 2020-01-27 DIAGNOSIS — Z9119 Patient's noncompliance with other medical treatment and regimen: Secondary | ICD-10-CM | POA: Diagnosis not present

## 2020-01-27 DIAGNOSIS — Z8249 Family history of ischemic heart disease and other diseases of the circulatory system: Secondary | ICD-10-CM | POA: Insufficient documentation

## 2020-01-27 DIAGNOSIS — R0609 Other forms of dyspnea: Secondary | ICD-10-CM

## 2020-01-27 DIAGNOSIS — N183 Chronic kidney disease, stage 3 unspecified: Secondary | ICD-10-CM | POA: Diagnosis not present

## 2020-01-27 DIAGNOSIS — Z6836 Body mass index (BMI) 36.0-36.9, adult: Secondary | ICD-10-CM

## 2020-01-27 DIAGNOSIS — E1121 Type 2 diabetes mellitus with diabetic nephropathy: Secondary | ICD-10-CM | POA: Diagnosis not present

## 2020-01-27 DIAGNOSIS — I129 Hypertensive chronic kidney disease with stage 1 through stage 4 chronic kidney disease, or unspecified chronic kidney disease: Secondary | ICD-10-CM | POA: Diagnosis not present

## 2020-01-27 DIAGNOSIS — R0600 Dyspnea, unspecified: Secondary | ICD-10-CM | POA: Diagnosis not present

## 2020-01-27 DIAGNOSIS — Z85528 Personal history of other malignant neoplasm of kidney: Secondary | ICD-10-CM

## 2020-01-27 DIAGNOSIS — Z794 Long term (current) use of insulin: Secondary | ICD-10-CM

## 2020-01-27 DIAGNOSIS — E118 Type 2 diabetes mellitus with unspecified complications: Secondary | ICD-10-CM | POA: Diagnosis not present

## 2020-01-27 DIAGNOSIS — E1122 Type 2 diabetes mellitus with diabetic chronic kidney disease: Secondary | ICD-10-CM

## 2020-01-27 DIAGNOSIS — I6930 Unspecified sequelae of cerebral infarction: Secondary | ICD-10-CM | POA: Diagnosis not present

## 2020-01-27 DIAGNOSIS — Z91199 Patient's noncompliance with other medical treatment and regimen due to unspecified reason: Secondary | ICD-10-CM

## 2020-01-27 DIAGNOSIS — N529 Male erectile dysfunction, unspecified: Secondary | ICD-10-CM

## 2020-01-27 MED ORDER — HYDROCHLOROTHIAZIDE 12.5 MG PO CAPS: 12.5000 mg | ORAL_CAPSULE | Freq: Every morning | ORAL | 0 refills | Status: DC

## 2020-01-27 MED ORDER — EZETIMIBE 10 MG PO TABS: 10.0000 mg | ORAL_TABLET | Freq: Every day | ORAL | 3 refills | Status: DC

## 2020-01-27 NOTE — Progress Notes (Signed)
REASON FOR CONSULT: Hypertension.  Chief Complaint  Patient presents with  . Hypertension  . New Patient (Initial Visit)    Referred by Dr. Chana Bode    REQUESTING PHYSICIAN:  Glade Lloyd Camelia Eng, PA-C [name],  [place] [id number]  HPI  Brandon [name] is a 59 y.o. male who presents to the office with a chief complaint of " shortness of breath and elevated blood pressure."  Patient's past medical history and cardiac risk factors include: Insulin-dependent diabetes mellitus type 2, hypertension, hyperlipidemia, prior stroke, erectile dysfunction, chronic kidney disease stage III, history of renal cell carcinoma.  Hypertension: Patient states that he has been diagnosed with high blood pressure for some time.  He states that he does not check his blood pressures at home and whenever it is checked the systolic blood pressures range between 814-481 mmHg diastolic blood pressures routine 60-75 mmHg.  Patient states that these numbers are within normal limits and therefore did not seek any additional medications titrations or further follow-up.  I spent significant amount of time at today's office visit educated him regarding the normal ranges of blood pressures given his age and comorbid conditions.  Patient is also educated on improving diet that is low in salt.  No known history of renal artery stenosis but patient does have history of renal cell carcinoma and currently has chronic kidney disease stage III.  Most recent blood work from January 2021 notes a creatinine of 1.58. Upon reviewing the notes from his primary care it seems the patient was on ACE inhibitor/ARB in the past but due to renal failure 01/2018 this was stopped.  He is tolerating Norvasc 5 mg p.o. daily; however, his medical therapy was uptitrated by his primary care provider to Norvasc 10 mg/telmisartan 40 mg p.o. daily.  However the patient has not started the new medication but states that he did receive a  phone call from pharmacy that it is available for pickup.  Other factors to be considered patient has a degree of residual deficits secondary to his prior stroke and has had prior episodes of falls.  Dyspnea on exertion: Patient states that he has been having shortness of breath for a long time.  It is mostly brought on by effort related activities and improves upon relaxing or resting.  Shortness of breath is not associated with any chest discomfort.  Patient has not taken any medications for the symptoms and they have not increased in intensity, frequency, or duration.  Patient states that he does have lower extremity swelling bilaterally which is chronic and stable.  He denies any orthopnea or paroxysmal nocturnal dyspnea.  No prior history of congestive heart failure according to him.  Review of systems positive for: Dyspnea on exertion, lower extremity swelling bilaterally. Currently patient denies chest pain, lightheadedness, dizziness, palpitations, orthopnea, paroxysmal nocturnal dyspnea, near syncope, syncopal events, hematochezia, hemoptysis, hematemesis, melanotic stools, no symptoms of amaurosis fugax, motor or sensory symptoms or dysphasia in the last 6 months.   History of stroke. Denies prior history of coronary artery disease, myocardial infarction, congestive heart failure, deep venous thrombosis, pulmonary embolism, transient ischemic attack.  FUNCTIONAL STATUS: No structured exercise program.    ALLERGIES: Allergies  Allergen Reactions  . Morphine Itching   MEDICATION LIST PRIOR TO VISIT: Current Outpatient Medications on File Prior to Visit  Medication Sig Dispense Refill  . aspirin EC 81 MG tablet Take 1 tablet (81 mg total) by mouth daily. 90 tablet 3  . atorvastatin (LIPITOR)  40 MG tablet Take 1 tablet (40 mg total) by mouth daily. 90 tablet 3  . blood glucose meter kit and supplies Dispense based on patient and insurance preference. Use up to four times daily as directed.  (FOR ICD-9 250.00, 250.01). 1 each 0  . glucose blood (ACCU-CHEK ACTIVE STRIPS) test strip Use as instructed 100 each 12  . glucose blood test strip accu chek active 1-2 times daily 100 each 12  . insulin aspart (NOVOLOG) 100 UNIT/ML injection Inject 5 Units into the skin 3 (three) times daily before meals. 10 mL 2  . Insulin Glargine (LANTUS SOLOSTAR) 100 UNIT/ML Solostar Pen Inject 60 Units into the skin daily. (Patient taking differently: Inject 65 Units into the skin daily. ) 15 pen 3  . Insulin Pen Needle (PEN NEEDLES) 32G X 4 MM MISC 1 each by Does not apply route daily. Use for insulin pens 200 each 2  . Lancets (ACCU-CHEK SOFT TOUCH) lancets Use as instructed 100 each 12  . levETIRAcetam (KEPPRA) 500 MG tablet Take 2 tablets (1,000 mg total) by mouth daily. (Patient taking differently: Take 500 mg by mouth daily. ) 60 tablet 11  . ondansetron (ZOFRAN) 4 MG tablet Take 1 tablet (4 mg total) by mouth daily as needed for nausea or vomiting. 30 tablet 1  . QUEtiapine (SEROQUEL) 25 MG tablet Take 1 tablet (25 mg total) by mouth daily. 30 tablet 1  . Semaglutide, 1 MG/DOSE, (OZEMPIC, 1 MG/DOSE,) 2 MG/1.5ML SOPN Inject 2 mg into the skin once a week. 3 pen 3  . sertraline (ZOLOFT) 100 MG tablet Take 1 tablet (100 mg total) by mouth daily. 30 tablet 1   No current facility-administered medications on file prior to visit.    PAST MEDICAL HISTORY: Past Medical History:  Diagnosis Date  . Acute ischemic stroke ([place]) 11/2013  . Allergy   . Anxiety   . Arthritis   . 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 Depression    BH Adm in [place] Depression  . Diabetes mellitus    DKA prior hospitalization  . Diverticulitis    s/p micorperforation Sept 2012-managed conservatively by Gen surgery  . ED (erectile dysfunction)   . Focal seizure ([place]) 11/2013   due to ischemic stroke  . GERD (gastroesophageal reflux disease)   . Hiatal hernia   . Hyperlipidemia     . Hypertension   . Kidney tumor 09/2011   Renal cell CA  . Seizures ([place])    none since 2016, taking Keppra - maw  . Sleep apnea   . Thyroid disease    "weak thyroid" per MD  . Wears glasses     PAST SURGICAL HISTORY: Past Surgical History:  Procedure Laterality Date  . COLONOSCOPY    . IR RADIOLOGIST EVAL & MGMT  04/04/2017  . KIDNEY SURGERY     ablation of renal cell CA - 12/28, prior one was October 2012-Dr. Kathlene Cote  . TEE WITHOUT CARDIOVERSION N/A 11/19/2013   Procedure: TRANSESOPHAGEAL ECHOCARDIOGRAM (TEE);  Surgeon: Lelon Perla, MD;  Location[facility] ENDOSCOPY;  Service: Cardiovascular;  Laterality: N/A;    FAMILY HISTORY: The patient family history includes Breast cancer in his sister; Diabetes in his father; Heart disease in his father; Pneumonia in his mother; Prostate cancer in an other family member; Stomach cancer in an other family member.   SOCIAL HISTORY:  The patient  reports that he has never smoked. He has never used  smokeless tobacco. He reports previous alcohol use. He reports that he does not use drugs.  14 ORGAN REVIEW OF SYSTEMS: CONSTITUTIONAL: No fever or significant weight loss EYES: No recent significant visual change EARS, NOSE, MOUTH, THROAT: No recent significant change in hearing CARDIOVASCULAR: See discussion in subjective/HPI RESPIRATORY: See discussion in subjective/HPI GASTROINTESTINAL: No recent complaints of abdominal pain GENITOURINARY: No recent significant change in genitourinary status MUSCULOSKELETAL: No recent significant change in musculoskeletal status INTEGUMENTARY: No recent rash NEUROLOGIC: No recent significant change in motor function PSYCHIATRIC: No recent significant change in mood ENDOCRINOLOGIC: No recent significant change in endocrine status HEMATOLOGIC/LYMPHATIC: No recent significant unexpected bruising ALLERGIC/IMMUNOLOGIC: No recent unexplained allergic reaction  PHYSICAL EXAM: Vitals with BMI 01/27/2020  01/27/2020 01/09/2020  Height - '5\' 11"'  '5\' 11"'   Weight - 281 lbs 14 oz 265 lbs  BMI - 49.17 91.50  Systolic 569 794 801  Diastolic 98 655 90  Pulse 74 72 75  Some encounter information is confidential and restricted. Go to Review Flowsheets activity to see all data.   CONSTITUTIONAL: Well-developed and well-nourished. No acute distress.  SKIN: Skin is warm and dry. No rash noted. No cyanosis. No pallor. No jaundice HEAD: Normocephalic and atraumatic.  EYES: No scleral icterus MOUTH/THROAT: Moist oral membranes.  NECK: No JVD present. No thyromegaly noted. No carotid bruits  LYMPHATIC: No visible cervical adenopathy.  CHEST Normal respiratory effort. No intercostal retractions  LUNGS: Clear to auscultation bilaterally.  No stridor. No wheezes. No rales.  CARDIOVASCULAR: Regular rate and rhythm, positive V7-S8, soft systolic ejection murmur over the left sternal border, no gallops or rubs appreciated. ABDOMINAL: Obese, soft, nontender, nondistended, positive bowel sounds in all 4 quadrants.  No apparent ascites.  EXTREMITIES: Dry skin noted bilaterally, +1 pitting edema bilaterally.  HEMATOLOGIC: No significant bruising NEUROLOGIC: Oriented to person, place, and time. Nonfocal.  Cranial nerves II to XII are grossly intact. PSYCHIATRIC: Normal mood and affect. Normal behavior. Cooperative  CARDIAC DATABASE: EKG: 02/05/2018: Normal sinus rhythm with a ventricular rate of 76bpm, first-degree AV block with PR interval 218 ms, QS patterns in lead V1-V2 suggestive of old septal infarct.  Without underlying injury pattern. 01/27/2020: Normal sinus rhythm with a ventricular rate of 74 bpm, first-degree AV block with PR interval of 215 4 ms, poor R wave progression, QS pattern in anteroseptal leads suggestive of possible old anterior infarct, without underlying injury pattern.  Echocardiogram: TEE 11/19/2013: Per report LVEF 65 to 70%, wall motion is normal, mild mitral valve prolapse, No bidirectional  atrial level per agitated saline study, and no significant valvular heart disease.  Stress Testing:  None  Heart Catheterization: 1990s in [place], [place].   Carotid duplex: 11/2013: Bilateral 1-39% ICA stenosis. Vertebral flow is antegrade.  Vascular imaging: 04/2016 ABI: ABIs, pedal waveforms, and TBIs are within normal limits.   LABORATORY DATA: CBC Latest Ref Rng & Units 12/31/2019 07/03/2019 04/24/2018  WBC 4.0 - 10.5 K/uL 7.2 5.2 5.5  Hemoglobin 13.0 - 17.0 g/dL 11.9(L) 10.7(L) 12.3(L)  Hematocrit 39.0 - 52.0 % 35.9(L) 33.0(L) 36.3(L)  Platelets 150 - 400 K/uL 201 203 262    CMP Latest Ref Rng & Units 12/31/2019 07/03/2019 04/24/2018  Glucose 70 - 99 mg/dL 496(H) 355(H) 65  BUN 6 - 20 mg/dL 23(H) 20 27(H)  Creatinine 0.61 - 1.24 mg/dL 1.58(H) 1.67(H) 1.39(H)  Sodium 135 - 145 mmol/L 135 136 144  Potassium 3.5 - 5.1 mmol/L 4.3 4.7 4.3  Chloride 98 - 111 mmol/L 102 98 103  CO2 22 -  32 mmol/L '23 24 24  ' Calcium 8.9 - 10.3 mg/dL 9.3 9.2 9.9  Total Protein 6.5 - 8.1 g/dL 7.7 7.0 -  Total Bilirubin 0.3 - 1.2 mg/dL 0.9 0.4 -  Alkaline Phos 38 - 126 U/L 148(H) 130(H) -  AST 15 - 41 U/L 22 13 -  ALT 0 - 44 U/L 25 13 -    Lipid Panel     Component Value Date/Time   CHOL 162 01/09/2020 1143   TRIG 79 01/09/2020 1143   HDL 40 01/09/2020 1143   CHOLHDL 4.1 01/09/2020 1143   CHOLHDL 4.0 12/29/2016 1144   VLDL 25 12/29/2016 1144   LDLCALC 107 (H) 01/09/2020 1143   LABVLDL 15 01/09/2020 1143    Lab Results  Component Value Date   HGBA1C 11.7 (H) 01/09/2020   HGBA1C 8.6 (H) 07/03/2019   HGBA1C 11.4 (H) 02/02/2018   No components found for: NTPROBNP Lab Results  Component Value Date   TSH 0.41 12/29/2016   TSH 1.339 03/02/2016   TSH 1.308 07/17/2013    FINAL MEDICATION LIST END OF ENCOUNTER: Meds ordered this encounter  Medications  . hydrochlorothiazide (MICROZIDE) 12.5 MG capsule    Sig: Take 1 capsule (12.5 mg total) by mouth every morning.    Dispense:  90 capsule     Refill:  0  . ezetimibe (ZETIA) 10 MG tablet    Sig: Take 1 tablet (10 mg total) by mouth daily.    Dispense:  90 tablet    Refill:  3    Medications Discontinued During This Encounter  Medication Reason  . amlodipine-olmesartan (AZOR) 10-20 MG tablet Error     Current Outpatient Medications:  .  aspirin EC 81 MG tablet, Take 1 tablet (81 mg total) by mouth daily., Disp: 90 tablet, Rfl: 3 .  atorvastatin (LIPITOR) 40 MG tablet, Take 1 tablet (40 mg total) by mouth daily., Disp: 90 tablet, Rfl: 3 .  blood glucose meter kit and supplies, Dispense based on patient and insurance preference. Use up to four times daily as directed. (FOR ICD-9 250.00, 250.01)., Disp: 1 each, Rfl: 0 .  glucose blood (ACCU-CHEK ACTIVE STRIPS) test strip, Use as instructed, Disp: 100 each, Rfl: 12 .  glucose blood test strip, accu chek active 1-2 times daily, Disp: 100 each, Rfl: 12 .  insulin aspart (NOVOLOG) 100 UNIT/ML injection, Inject 5 Units into the skin 3 (three) times daily before meals., Disp: 10 mL, Rfl: 2 .  Insulin Glargine (LANTUS SOLOSTAR) 100 UNIT/ML Solostar Pen, Inject 60 Units into the skin daily. (Patient taking differently: Inject 65 Units into the skin daily. ), Disp: 15 pen, Rfl: 3 .  Insulin Pen Needle (PEN NEEDLES) 32G X 4 MM MISC, 1 each by Does not apply route daily. Use for insulin pens, Disp: 200 each, Rfl: 2 .  Lancets (ACCU-CHEK SOFT TOUCH) lancets, Use as instructed, Disp: 100 each, Rfl: 12 .  levETIRAcetam (KEPPRA) 500 MG tablet, Take 2 tablets (1,000 mg total) by mouth daily. (Patient taking differently: Take 500 mg by mouth daily. ), Disp: 60 tablet, Rfl: 11 .  ondansetron (ZOFRAN) 4 MG tablet, Take 1 tablet (4 mg total) by mouth daily as needed for nausea or vomiting., Disp: 30 tablet, Rfl: 1 .  QUEtiapine (SEROQUEL) 25 MG tablet, Take 1 tablet (25 mg total) by mouth daily., Disp: 30 tablet, Rfl: 1 .  Semaglutide, 1 MG/DOSE, (OZEMPIC, 1 MG/DOSE,) 2 MG/1.5ML SOPN, Inject 2 mg into  the skin once a week., Disp: 3  pen, Rfl: 3 .  sertraline (ZOLOFT) 100 MG tablet, Take 1 tablet (100 mg total) by mouth daily., Disp: 30 tablet, Rfl: 1 .  ezetimibe (ZETIA) 10 MG tablet, Take 1 tablet (10 mg total) by mouth daily., Disp: 90 tablet, Rfl: 3 .  hydrochlorothiazide (MICROZIDE) 12.5 MG capsule, Take 1 capsule (12.5 mg total) by mouth every morning., Disp: 90 capsule, Rfl: 0  IMPRESSION:    ICD-10-CM   1. Benign hypertension with CKD (chronic kidney disease) stage III  I12.9 EKG 12-Lead   N18.30 CMP14+EGFR    Magnesium  2. Dyspnea on exertion  R06.00 PCV ECHOCARDIOGRAM COMPLETE    PCV MYOCARDIAL PERFUSION WITH LEXISCAN    CMP14+EGFR    Magnesium  3. Type 2 diabetes mellitus with stage 3a chronic kidney disease, with long-term current use of insulin (HCC)  E11.21 PCV ECHOCARDIOGRAM COMPLETE   N18.31 PCV MYOCARDIAL PERFUSION WITH LEXISCAN   Z79.4   4. Type 2 diabetes mellitus with complication, with long-term current use of insulin (HCC)  E11.8 PCV ECHOCARDIOGRAM COMPLETE   Z79.4 PCV MYOCARDIAL PERFUSION WITH LEXISCAN  5. History of cerebrovascular accident (CVA) with residual deficit  I69.30 PCV ECHOCARDIOGRAM COMPLETE    PCV MYOCARDIAL PERFUSION WITH LEXISCAN  6. Family history of premature CAD  Z82.49   7. Erectile dysfunction, unspecified erectile dysfunction type  N52.9   8. History of renal cell cancer  Z85.528   9. Noncompliance  Z91.19   10. Class 2 severe obesity due to excess calories with serious comorbidity and body mass index (BMI) of 36.0 to 36.9 in adult [facility])  E66.01    Z68.36      RECOMMENDATIONS: Benign essential hypertension with stage IIIa chronic kidney disease:  Patient's home blood pressures are currently not at goal and this was discussed in detail with him at today's office visit.  Patient is encouraged to invest in a blood pressure cuff and to keep a log of his blood pressures and to bring it in at the next office visit so his medications can  further be titrated.  I agree with the primary care in regards to the addition, ARB's given his history of diabetes and prior stroke.  However, patient has not started this medication at the time of this encounter.  Patient is encouraged to go pick up this medication and see how he tolerates the medication to trend his blood pressures.  Patient's blood pressure is currently more than 20 mmHg from his goal and I believe that he will require additional antihypertensive medications.  Given his lower extremity swelling we will start the patient on low-dose hydrochlorothiazide 12.5 mg p.o. every morning.  Blood pressure labs in 1 week to reevaluate kidney function and electrolytes.  Dyspnea on exertion:  Could be secondary to elevated blood pressures contributing to elevated filling pressures.  However, given the patient's history of prior stroke, uncontrolled insulin-dependent diabetes mellitus type 2 with most recent hemoglobin A1c of 11.7, family history of heart disease with dad having heart disease younger than the age of 8 would recommend an ischemic evaluation.  Patient is agreeable to proceed with nuclear stress test to rule out ischemic burden.  Plan echocardiogram to evaluate for structural heart disease and LV function.  And will start hydrochlorothiazide 12.5 mg p.o. every morning.  Hyperlipidemia in the setting of insulin-dependent diabetes mellitus type 2:  Most recent LDL is 92 mg/dL, non-HDL-C 122 mg/dL, currently on atorvastatin 40 mg p.o. nightly.  Recommending the initiation of Zetia 10 mg p.o. daily  with a goal LDL of less than 70 mg/dL.  Continue to monitor.  History of CVA:  Educated on importance of secondary prevention and modifications of his cardiovascular risk factors.  Continue aspirin 81 mg p.o. daily.  Continue statin therapy.  We will start Zetia.  Educated on lifestyle modifications.  Family history of premature coronary artery disease: Continue  risk factor modifications.  Insulin-dependent diabetes mellitus type 2: Currently managed by primary team.   Orders Placed This Encounter  Procedures  . CMP14+EGFR  . Magnesium  . PCV MYOCARDIAL PERFUSION WITH LEXISCAN  . EKG 12-Lead  . PCV ECHOCARDIOGRAM COMPLETE   --Continue cardiac medications as reconciled in final medication list. --Return in about 4 weeks (around 02/24/2020) for Discussion of test results.. Or sooner if needed. --Continue follow-up with your primary care physician regarding the management of your other chronic comorbid conditions.  Patient's questions and concerns were addressed to his satisfaction. He voices understanding of the instructions provided during this encounter.   This note was created using a voice recognition software as a result there may be grammatical errors inadvertently enclosed that do not reflect the nature of this encounter. Every attempt is made to correct such errors.  Rex Kras, DO, [facility]. [place] Office: [phone]

## 2020-01-27 NOTE — Patient Instructions (Signed)
Please remember to bring in your medication bottles in at the next visit.   New Medications that were added at today's visit:  Zetia 10mg  po qday Hydrochlorothiazide 12.5mg  po every morning.  Pick  Up the blood pressure that was given to you by your PCP  Medications that were discontinued at today's visit: None  Office will call you to have the following tests scheduled:  Stress Test Echo  Please get labs done in about 1 week at the nearest [place] a log of your blood pressures and bring the log in to the next visit.   Recommend follow up with your PCP as scheduled.

## 2020-01-29 ENCOUNTER — Other Ambulatory Visit: Payer: Self-pay | Admitting: Cardiology

## 2020-01-29 DIAGNOSIS — N183 Chronic kidney disease, stage 3 unspecified: Secondary | ICD-10-CM

## 2020-01-29 DIAGNOSIS — I129 Hypertensive chronic kidney disease with stage 1 through stage 4 chronic kidney disease, or unspecified chronic kidney disease: Secondary | ICD-10-CM

## 2020-01-29 DIAGNOSIS — R0609 Other forms of dyspnea: Secondary | ICD-10-CM

## 2020-02-05 DIAGNOSIS — N183 Chronic kidney disease, stage 3 unspecified: Secondary | ICD-10-CM | POA: Diagnosis not present

## 2020-02-05 DIAGNOSIS — R0600 Dyspnea, unspecified: Secondary | ICD-10-CM | POA: Diagnosis not present

## 2020-02-05 DIAGNOSIS — I129 Hypertensive chronic kidney disease with stage 1 through stage 4 chronic kidney disease, or unspecified chronic kidney disease: Secondary | ICD-10-CM | POA: Diagnosis not present

## 2020-02-06 ENCOUNTER — Encounter: Payer: Self-pay | Admitting: Internal Medicine

## 2020-02-06 ENCOUNTER — Ambulatory Visit (INDEPENDENT_AMBULATORY_CARE_PROVIDER_SITE_OTHER): Payer: Medicare Other | Admitting: Internal Medicine

## 2020-02-06 ENCOUNTER — Other Ambulatory Visit: Payer: Self-pay

## 2020-02-06 VITALS — BP 132/82 | HR 68 | Temp 98.1°F | Ht 71.0 in | Wt 284.6 lb

## 2020-02-06 DIAGNOSIS — E0865 Diabetes mellitus due to underlying condition with hyperglycemia: Secondary | ICD-10-CM | POA: Diagnosis not present

## 2020-02-06 DIAGNOSIS — N1831 Chronic kidney disease, stage 3a: Secondary | ICD-10-CM

## 2020-02-06 DIAGNOSIS — E1121 Type 2 diabetes mellitus with diabetic nephropathy: Secondary | ICD-10-CM

## 2020-02-06 DIAGNOSIS — Z794 Long term (current) use of insulin: Secondary | ICD-10-CM

## 2020-02-06 DIAGNOSIS — E1142 Type 2 diabetes mellitus with diabetic polyneuropathy: Secondary | ICD-10-CM

## 2020-02-06 LAB — CMP14+EGFR
ALT: 42 IU/L (ref 0–44)
AST: 38 IU/L (ref 0–40)
Albumin/Globulin Ratio: 1.4 (ref 1.2–2.2)
Albumin: 4.7 g/dL (ref 3.8–4.9)
Alkaline Phosphatase: 160 IU/L — ABNORMAL HIGH (ref 39–117)
BUN/Creatinine Ratio: 16 (ref 9–20)
BUN: 29 mg/dL — ABNORMAL HIGH (ref 6–24)
Bilirubin Total: 0.3 mg/dL (ref 0.0–1.2)
CO2: 23 mmol/L (ref 20–29)
Calcium: 9.6 mg/dL (ref 8.7–10.2)
Chloride: 98 mmol/L (ref 96–106)
Creatinine, Ser: 1.77 mg/dL — ABNORMAL HIGH (ref 0.76–1.27)
GFR calc Af Amer: 49 mL/min/{1.73_m2} — ABNORMAL LOW (ref >59)
GFR calc non Af Amer: 42 mL/min/{1.73_m2} — ABNORMAL LOW (ref >59)
Globulin, Total: 3.3 g/dL (ref 1.5–4.5)
Glucose: 181 mg/dL — ABNORMAL HIGH (ref 65–99)
Potassium: 4 mmol/L (ref 3.5–5.2)
Sodium: 141 mmol/L (ref 134–144)
Total Protein: 8 g/dL (ref 6.0–8.5)

## 2020-02-06 LAB — GLUCOSE, POCT (MANUAL RESULT ENTRY): POC Glucose: 120 mg/dl — AB (ref 70–99)

## 2020-02-06 LAB — MAGNESIUM: Magnesium: 2.1 mg/dL (ref 1.6–2.3)

## 2020-02-06 MED ORDER — LANTUS SOLOSTAR 100 UNIT/ML ~~LOC~~ SOPN: 60.0000 [IU] | PEN_INJECTOR | Freq: Every day | SUBCUTANEOUS | 6 refills | Status: DC

## 2020-02-06 MED ORDER — NOVOLOG FLEXPEN 100 UNIT/ML ~~LOC~~ SOPN: 15.0000 [IU] | PEN_INJECTOR | Freq: Three times a day (TID) | SUBCUTANEOUS | 6 refills | Status: DC

## 2020-02-06 NOTE — Progress Notes (Signed)
Name: Brandon [name]  Age/ Sex: 59 y.o., male   MRN/ DOB: [id number], 02-02-1961     PCP: Caryl Ada   Reason for Endocrinology Evaluation: Type 2 Diabetes Mellitus  Initial Endocrine Consultative Visit: 02/06/2020    PATIENT IDENTIFIER: Brandon [name] is a 59 y.o. male with a past medical history of T2Dm, HTN, seizure d/o and dyslipidemia. The patient has followed with Endocrinology clinic since 02/06/2020 for consultative assistance with management of his diabetes.  DIABETIC HISTORY:  Brandon [name] was diagnosed with T2DM in 2012, he has been on insulin since his diagnosis, he was on januvia at some point  As well a metformin . His hemoglobin A1c has ranged from 7.1% in 2015, peaking at 11.7% in 2021.    Accompanied by Barbette Or    SUBJECTIVE:    Today (02/09/2020): Brandon [name] is accompanied by his friend and advocate Barbette Or. He checks his blood sugars 0 times daily, as he did not have any testing strips.  The patient endorse an episode of symptoms consistent with hypoglycemia, he was unable to check his glucose at the time due to lack of testing strips. . Otherwise, the patient has required emergency interventions for hypoglycemia per [name] months ago, but has not had recent hospitalizations secondary to hyper or hypoglycemic episodes.    Patient admits to forgetfulness and non-compliance in the past , pt with GI issues for the past a couple of months.    HOME DIABETES REGIMEN:  Lantus 65 units BID  Novolog  Insulin mix 15 units  BID  Ozempic 0.25   Statin: yes ACE-I/ARB: no   METER DOWNLOAD SUMMARY: did not bring     DIABETIC COMPLICATIONS: Microvascular complications:   Neuropathy, CKDIII  Denies: retinopathy  Last Eye Exam: Completed 2019  Macrovascular complications:   CVA  Denies: CAD,PVD   HISTORY:  Past Medical History:  Past Medical History:  Diagnosis Date  . Acute ischemic stroke ([place]) 11/2013  . Allergy   . Anxiety    . Arthritis   . Benign hypertension with CKD (chronic kidney disease) stage III   . 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 CVA (cerebral vascular accident) ([place])    stroke  . Depression    BH Adm in [place] Depression  . Diabetes mellitus    DKA prior hospitalization  . Diverticulitis    s/p micorperforation Sept 2012-managed conservatively by Gen surgery  . ED (erectile dysfunction)   . Focal seizure ([place]) 11/2013   due to ischemic stroke  . GERD (gastroesophageal reflux disease)   . Hiatal hernia   . Hyperlipidemia   . Hypertension   . Kidney tumor 09/2011   Renal cell CA  . Seizures ([place])    none since 2016, taking Keppra - maw  . Sleep apnea   . Thyroid disease    "weak thyroid" per MD  . Wears glasses    Past Surgical History:  Past Surgical History:  Procedure Laterality Date  . COLONOSCOPY    . IR RADIOLOGIST EVAL & MGMT  04/04/2017  . KIDNEY SURGERY     ablation of renal cell CA - 12/28, prior one was October 2012-Dr. Kathlene Cote  . TEE WITHOUT CARDIOVERSION N/A 11/19/2013   Procedure: TRANSESOPHAGEAL ECHOCARDIOGRAM (TEE);  Surgeon: Lelon Perla, MD;  Location: [facility] ENDOSCOPY;  Service: Cardiovascular;  Laterality: N/A;    Social History:  reports that he has never smoked. He  has never used smokeless tobacco. He reports previous alcohol use. He reports that he does not use drugs. Family History:  Family History  Problem Relation Age of Onset  . Diabetes Father   . Heart disease Father   . Pneumonia Mother   . Prostate cancer Other   . Stomach cancer Other   . Breast cancer Sister   . Colon cancer Neg Hx   . Esophageal cancer Neg Hx   . Rectal cancer Neg Hx      HOME MEDICATIONS: Allergies as of 02/06/2020      Reactions   Morphine Itching      Medication List       Accurate as of February 06, 2020 11:59 PM. If you have any questions, ask your nurse or doctor.        STOP taking these medications   insulin  aspart 100 UNIT/ML injection Commonly known as: novoLOG Replaced by: NovoLOG FlexPen 100 UNIT/ML FlexPen Stopped by: Dorita Sciara, MD     TAKE these medications   ACCU-CHEK ACTIVE STRIPS test strip Generic drug: glucose blood Use as instructed   glucose blood test strip accu chek active 1-2 times daily   accu-chek soft touch lancets Use as instructed   aspirin EC 81 MG tablet Take 1 tablet (81 mg total) by mouth daily.   atorvastatin 40 MG tablet Commonly known as: LIPITOR Take 1 tablet (40 mg total) by mouth daily.   blood glucose meter kit and supplies Dispense based on patient and insurance preference. Use up to four times daily as directed. (FOR ICD-9 250.00, 250.01).   ezetimibe 10 MG tablet Commonly known as: ZETIA Take 1 tablet (10 mg total) by mouth daily.   hydrochlorothiazide 12.5 MG capsule Commonly known as: MICROZIDE Take 1 capsule (12.5 mg total) by mouth every morning.   Lantus SoloStar 100 UNIT/ML Solostar Pen Generic drug: insulin glargine Inject 60 Units into the skin daily. What changed: how much to take   levETIRAcetam 500 MG tablet Commonly known as: Keppra Take 2 tablets (1,000 mg total) by mouth daily. What changed: how much to take   NovoLOG FlexPen 100 UNIT/ML FlexPen Generic drug: insulin aspart Inject 15 Units into the skin 3 (three) times daily with meals. Replaces: insulin aspart 100 UNIT/ML injection Started by: Dorita Sciara, MD   ondansetron 4 MG tablet Commonly known as: Zofran Take 1 tablet (4 mg total) by mouth daily as needed for nausea or vomiting.   Ozempic (1 MG/DOSE) 2 MG/1.5ML Sopn Generic drug: Semaglutide (1 MG/DOSE) Inject 2 mg into the skin once a week.   Pen Needles 32G X 4 MM Misc 1 each by Does not apply route daily. Use for insulin pens   QUEtiapine 25 MG tablet Commonly known as: SEROQUEL Take 1 tablet (25 mg total) by mouth daily.   sertraline 100 MG tablet Commonly known as:  ZOLOFT Take 1 tablet (100 mg total) by mouth daily.        OBJECTIVE:   Vital Signs: BP 132/82 (BP Location: Right Arm, Patient Position: Sitting, Cuff Size: Large)   Pulse 68   Temp 98.1 F (36.7 C)   Ht '5\' 11"'  (1.803 m)   Wt 284 lb 9.6 oz (129.1 kg)   SpO2 99%   BMI 39.69 kg/m   Wt Readings from Last 3 Encounters:  02/06/20 284 lb 9.6 oz (129.1 kg)  01/27/20 281 lb 14.4 oz (127.9 kg)  01/09/20 265 lb (120.2 kg)  Exam: General: Pt appears well and is in NAD  HEENT:  Eyes: External eye exam normal without stare, lid lag or exophthalmos.      Neck: General: Supple without adenopathy. Thyroid: Thyroid size normal.  No goiter or nodules appreciated. No thyroid bruit.  Lungs: Clear with good BS bilat with no rales, rhonchi, or wheezes  Heart: RRR with normal S1 and S2 and no gallops; no murmurs; no rub  Abdomen: Normoactive bowel sounds, soft, nontender, without masses or organomegaly palpable  Extremities: No pretibial edema.  Skin: Normal texture and temperature to palpation.  Neuro: MS is good with appropriate affect, pt is alert and Ox3    DM foot exam:Deferred        DATA REVIEWED:  Lab Results  Component Value Date   HGBA1C 11.7 (H) 01/09/2020   HGBA1C 8.6 (H) 07/03/2019   HGBA1C 11.4 (H) 02/02/2018   Lab Results  Component Value Date   MICROALBUR 0.4 04/23/2015   LDLCALC 107 (H) 01/09/2020   CREATININE 1.77 (H) 02/05/2020   Lab Results  Component Value Date   MICRALBCREAT 1,498 (H) 01/09/2020     Lab Results  Component Value Date   CHOL 162 01/09/2020   HDL 40 01/09/2020   LDLCALC 107 (H) 01/09/2020   TRIG 79 01/09/2020   CHOLHDL 4.1 01/09/2020         ASSESSMENT / PLAN / RECOMMENDATIONS:   1) Type 2 Diabetes Mellitus, Poorly controlled, With Neuropathic and CKD III complications - Most recent A1c of 11.7 %. Goal A1c < 7.0 %.    - Pt is accompanied by his friend [name] ,who is also his advocate but neither one of them brought his  insulin today, pt insisted he was on 70/30 insulin , but nothing in the chart about him being prescribed this, [name] wasn't sure what prandial insulin he is taking, there was a lot of back and forth. Pt admits to forgetfulness. My CMA had to call the pharmacy and confirmed pt is on Novolog and not the prandial mix. Discussed the expectations of a pt advocate with [name], that when a pt advocate is with a pt , the medical field expect them to help with the pt history, medications etc due to limited visit time.  - Pt barriers to diabetes care is his memory, [name] works during the day, so she is not aware of what he takes or doesn't when she is not home .Difficult situation but I will try my best.  - Discussed pharmacokinetics of basal/bolus insulin and the importance of taking prandial insulin with meals.  We also discussed avoiding sugar-sweetened beverages and snacks, when possible.  - pt would like Januvia, but I explained the difference in glucose lowering effects between Januvia and Ozempic, and he is in agreement. He is currently is having GI issues, this is not thought to be related to his Ozempic , as he has been on this ~ 2 yrs per the pt. But I will not increase the dose at this time.  -Pt endorses hypoglycemia in the fasting status, this could have been due to using prandial insulin at bedtime rather then dinner time vs too much basal insulin.  - We discussed the importance of checking glucose at home and having this data available to me.   MEDICATIONS: - Decrease Lantus to 60 units daily  - Novolog  15 units with each meal  - Continue Ozempic 0.25 mg weekly    EDUCATION / INSTRUCTIONS:  BG monitoring instructions: Patient is instructed to check his blood sugars 3 times a day, before meals .  Call [facility] if: BG persistently < 70 or > 300. . I reviewed the Rule of 15 for the treatment of hypoglycemia in detail with the patient. Literature supplied.   2)  Diabetic complications:   Eye: Does not have known diabetic retinopathy.   Neuro/ Feet: Does  have known diabetic peripheral neuropathy .   Renal: Patient does  have known baseline CKD.   3) Lipids: Patient is on lipitor and Zetia, LDL above goal at 107 mg/dL. I question compliance because his LDL was < 100 mg/dL in the past. Encouraged compliance.      F/U in 3 months    Signed electronically by: Mack Guise, MD  [facility] Endocrinology  [address] [place], [place] [address] Phone: [phone] FAX: [phone]   CC: Carlena Hurl, PA-C [place] [address] Phone: [phone]  Fax: [phone]  Return to Endocrinology clinic as below: Future Appointments  Date Time Provider [place]  02/10/2020  8:15 AM PCV-NM 1 PCV-IMG None  02/11/2020  9:20 AM Armbruster, Carlota Raspberry, MD LBGI-GI [facility]  02/24/2020 10:00 AM Rex Kras, DO PCV-PCV None  05/14/2020 11:10 AM Neeva Trew, Melanie Crazier, MD LBPC-LBENDO None

## 2020-02-06 NOTE — Patient Instructions (Signed)
-   Decrease Lantus to 60 units daily  - Novolog  15 units with each meal  - Continue Ozempic 0.25 mg weekly      - Check sugar before each meal     HOW TO TREAT LOW BLOOD SUGARS (Blood sugar LESS THAN 70 MG/DL)  Please follow the RULE OF 15 for the treatment of hypoglycemia treatment (when your (blood sugars are less than 70 mg/dL)    STEP 1: Take 15 grams of carbohydrates when your blood sugar is low, which includes:   3-4 GLUCOSE TABS  OR  3-4 OZ OF JUICE OR REGULAR SODA OR  ONE TUBE OF GLUCOSE GEL     STEP 2: RECHECK blood sugar in 15 MINUTES STEP 3: If your blood sugar is still low at the 15 minute recheck --> then, go back to STEP 1 and treat AGAIN with another 15 grams of carbohydrates.

## 2020-02-07 ENCOUNTER — Ambulatory Visit: Payer: Medicare Other

## 2020-02-07 DIAGNOSIS — I6930 Unspecified sequelae of cerebral infarction: Secondary | ICD-10-CM

## 2020-02-07 DIAGNOSIS — E118 Type 2 diabetes mellitus with unspecified complications: Secondary | ICD-10-CM | POA: Diagnosis not present

## 2020-02-07 DIAGNOSIS — E1121 Type 2 diabetes mellitus with diabetic nephropathy: Secondary | ICD-10-CM | POA: Diagnosis not present

## 2020-02-07 DIAGNOSIS — R0600 Dyspnea, unspecified: Secondary | ICD-10-CM | POA: Diagnosis not present

## 2020-02-07 DIAGNOSIS — N1831 Chronic kidney disease, stage 3a: Secondary | ICD-10-CM

## 2020-02-07 DIAGNOSIS — R0609 Other forms of dyspnea: Secondary | ICD-10-CM

## 2020-02-07 DIAGNOSIS — Z794 Long term (current) use of insulin: Secondary | ICD-10-CM | POA: Diagnosis not present

## 2020-02-09 DIAGNOSIS — E1142 Type 2 diabetes mellitus with diabetic polyneuropathy: Secondary | ICD-10-CM | POA: Insufficient documentation

## 2020-02-09 DIAGNOSIS — E119 Type 2 diabetes mellitus without complications: Secondary | ICD-10-CM | POA: Insufficient documentation

## 2020-02-09 DIAGNOSIS — Z794 Long term (current) use of insulin: Secondary | ICD-10-CM | POA: Insufficient documentation

## 2020-02-10 ENCOUNTER — Other Ambulatory Visit: Payer: Medicare Other

## 2020-02-11 ENCOUNTER — Other Ambulatory Visit: Payer: Self-pay | Admitting: Cardiology

## 2020-02-11 ENCOUNTER — Other Ambulatory Visit (INDEPENDENT_AMBULATORY_CARE_PROVIDER_SITE_OTHER): Payer: Medicare Other

## 2020-02-11 ENCOUNTER — Ambulatory Visit (INDEPENDENT_AMBULATORY_CARE_PROVIDER_SITE_OTHER): Payer: Medicare Other | Admitting: Gastroenterology

## 2020-02-11 ENCOUNTER — Encounter: Payer: Self-pay | Admitting: Gastroenterology

## 2020-02-11 VITALS — BP 154/90 | HR 68 | Temp 98.4°F | Ht 71.0 in | Wt 274.8 lb

## 2020-02-11 DIAGNOSIS — R194 Change in bowel habit: Secondary | ICD-10-CM | POA: Diagnosis not present

## 2020-02-11 DIAGNOSIS — R159 Full incontinence of feces: Secondary | ICD-10-CM | POA: Diagnosis not present

## 2020-02-11 DIAGNOSIS — R0609 Other forms of dyspnea: Secondary | ICD-10-CM

## 2020-02-11 DIAGNOSIS — N183 Chronic kidney disease, stage 3 unspecified: Secondary | ICD-10-CM

## 2020-02-11 DIAGNOSIS — R748 Abnormal levels of other serum enzymes: Secondary | ICD-10-CM

## 2020-02-11 DIAGNOSIS — I129 Hypertensive chronic kidney disease with stage 1 through stage 4 chronic kidney disease, or unspecified chronic kidney disease: Secondary | ICD-10-CM

## 2020-02-11 LAB — CBC WITH DIFFERENTIAL/PLATELET
Basophils Absolute: 0 10*3/uL (ref 0.0–0.1)
Basophils Relative: 0.7 % (ref 0.0–3.0)
Eosinophils Absolute: 0.1 10*3/uL (ref 0.0–0.7)
Eosinophils Relative: 2.3 % (ref 0.0–5.0)
HCT: 36.8 % — ABNORMAL LOW (ref 39.0–52.0)
Hemoglobin: 12.8 g/dL — ABNORMAL LOW (ref 13.0–17.0)
Lymphocytes Relative: 28.8 % (ref 12.0–46.0)
Lymphs Abs: 1.4 10*3/uL (ref 0.7–4.0)
MCHC: 34.8 g/dL (ref 30.0–36.0)
MCV: 87.7 fl (ref 78.0–100.0)
Monocytes Absolute: 0.3 10*3/uL (ref 0.1–1.0)
Monocytes Relative: 6.3 % (ref 3.0–12.0)
Neutro Abs: 3.1 10*3/uL (ref 1.4–7.7)
Neutrophils Relative %: 61.9 % (ref 43.0–77.0)
Platelets: 238 10*3/uL (ref 150.0–400.0)
RBC: 4.2 Mil/uL — ABNORMAL LOW (ref 4.22–5.81)
RDW: 11.9 % (ref 11.5–15.5)
WBC: 5 10*3/uL (ref 4.0–10.5)

## 2020-02-11 LAB — GAMMA GT: GGT: 69 U/L — ABNORMAL HIGH (ref 7–51)

## 2020-02-11 LAB — TSH: TSH: 2.25 u[IU]/mL (ref 0.35–4.50)

## 2020-02-11 NOTE — Progress Notes (Signed)
HPI :  59 year old male with a history of renal cell carcinoma status post ablative therapy by IR, history of chronic constipation, history of diverticulitis, here for a follow-up visit for change in bowel habits.  He states for the past several months, at least since November, he has had a significant change in his bowel habits.  He historically has had severe constipation and a significant amount of difficulty clearing out appropriately for colonoscopy.  Now he has significant urgency with his stools.  He has multiple loose stools per day, significant urgency after he eats anything, stool is watery.  Symptoms started after receiving antibiotics for possible diverticulitis, he was given Augmentin in November.  Bowels have never got back to normal.  He has been having accidents at night with some nocturnal symptoms.  He states about 3 times a week this will happen.  He has seen blood about 3 times in his bowels, but otherwise no persistent bleeding.  He states his stool is normally brown, but can sometimes be dark black.  He states when it is black it is usually remains watery, denies sticky/tarry stools.  Has not been taking any iron or Pepto-Bismol.  He has had some persistent left-sided abdominal pain which has never really gone away after treatment of possible diverticulitis several months ago.  At mild that he can function but states it is persistent.  He has been on Zoloft for more than a year.  No other major changes to his medicines that he endorses today.  Weight seems stable.  Of note he did have that pain when I saw him last in clinic, he was given MiraLAX to treat constipation and had tried him on gabapentin at the time for possible neuropathic pain.  Pain seems to localize in his left flank.  He denies NSAID use.  His last colonoscopy after multiple preps was normal in July 2018 without polyps, only remarkable finding was diverticulosis of the left colon. He has had an ultrasound of his right  upper quadrant in March which showed some sludge but no stones. He denies any right upper quadrant pain at this time  Of note incidentally noted on labs has had a rising alkaline phosphatase in recent months..  Last checked in January level was 148, prior to that was 130.  His AST and ALT have been normal.  Colonoscopy 06/15/2017 - diverticulosis of left colon, internal hemorrhoids, no polyps, good prep  CT abdomen / pelvis 02/05/2018 - diverticulosis, renal carcinomas s/p ablation -- stable  RUQ [place] - 02/05/2018 - sludge noted, no stones   Past Medical History:  Diagnosis Date  . Acute ischemic stroke ([place]) 11/2013  . Allergy   . Anxiety   . Arthritis   . Benign hypertension with CKD (chronic kidney disease) stage III   . 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 CVA (cerebral vascular accident) ([place])    stroke  . Depression    BH Adm in [place] Depression  . Diabetes mellitus    DKA prior hospitalization  . Diverticulitis    s/p micorperforation Sept 2012-managed conservatively by Gen surgery  . ED (erectile dysfunction)   . Focal seizure ([place]) 11/2013   due to ischemic stroke  . GERD (gastroesophageal reflux disease)   . Hiatal hernia   . Hyperlipidemia   . Hypertension   . Kidney tumor 09/2011   Renal cell CA  . Seizures ([place])    none since 2016,  taking Keppra - maw  . Sleep apnea   . Thyroid disease    "weak thyroid" per MD  . Wears glasses      Past Surgical History:  Procedure Laterality Date  . COLONOSCOPY    . IR RADIOLOGIST EVAL & MGMT  04/04/2017  . KIDNEY SURGERY     ablation of renal cell CA - 12/28, prior one was October 2012-Dr. Kathlene Cote  . TEE WITHOUT CARDIOVERSION N/A 11/19/2013   Procedure: TRANSESOPHAGEAL ECHOCARDIOGRAM (TEE);  Surgeon: Lelon Perla, MD;  Location: [facility] ENDOSCOPY;  Service: Cardiovascular;  Laterality: N/A;   Family History  Problem Relation Age of Onset  . Diabetes Father   . Heart disease  Father   . Pneumonia Mother   . Prostate cancer Other   . Stomach cancer Other   . Breast cancer Sister   . Colon cancer Neg Hx   . Esophageal cancer Neg Hx   . Rectal cancer Neg Hx    Social History   Tobacco Use  . Smoking status: Never Smoker  . Smokeless tobacco: Never Used  Substance Use Topics  . Alcohol use: Not Currently    Comment: rarely  . Drug use: No   Current Outpatient Medications  Medication Sig Dispense Refill  . aspirin EC 81 MG tablet Take 1 tablet (81 mg total) by mouth daily. 90 tablet 3  . atorvastatin (LIPITOR) 40 MG tablet Take 1 tablet (40 mg total) by mouth daily. 90 tablet 3  . blood glucose meter kit and supplies Dispense based on patient and insurance preference. Use up to four times daily as directed. (FOR ICD-9 250.00, 250.01). 1 each 0  . ezetimibe (ZETIA) 10 MG tablet Take 1 tablet (10 mg total) by mouth daily. 90 tablet 3  . glucose blood (ACCU-CHEK ACTIVE STRIPS) test strip Use as instructed 100 each 12  . glucose blood test strip accu chek active 1-2 times daily 100 each 12  . hydrochlorothiazide (MICROZIDE) 12.5 MG capsule Take 1 capsule (12.5 mg total) by mouth every morning. 90 capsule 0  . insulin aspart (NOVOLOG FLEXPEN) 100 UNIT/ML FlexPen Inject 15 Units into the skin 3 (three) times daily with meals. 15 mL 6  . insulin glargine (LANTUS SOLOSTAR) 100 UNIT/ML Solostar Pen Inject 60 Units into the skin daily. 15 pen 6  . Insulin Pen Needle (PEN NEEDLES) 32G X 4 MM MISC 1 each by Does not apply route daily. Use for insulin pens 200 each 2  . Lancets (ACCU-CHEK SOFT TOUCH) lancets Use as instructed 100 each 12  . levETIRAcetam (KEPPRA) 500 MG tablet Take 2 tablets (1,000 mg total) by mouth daily. (Patient taking differently: Take 500 mg by mouth daily. ) 60 tablet 11  . ondansetron (ZOFRAN) 4 MG tablet Take 1 tablet (4 mg total) by mouth daily as needed for nausea or vomiting. 30 tablet 1  . QUEtiapine (SEROQUEL) 25 MG tablet Take 1 tablet (25  mg total) by mouth daily. 30 tablet 1  . Semaglutide, 1 MG/DOSE, (OZEMPIC, 1 MG/DOSE,) 2 MG/1.5ML SOPN Inject 2 mg into the skin once a week. 3 pen 3  . sertraline (ZOLOFT) 100 MG tablet Take 1 tablet (100 mg total) by mouth daily. 30 tablet 1   No current facility-administered medications for this visit.   Allergies  Allergen Reactions  . Morphine Itching     Review of Systems: All systems reviewed and negative except where noted in HPI.    PCV ECHOCARDIOGRAM COMPLETE  Result Date: 02/09/2020 Echocardiogram  02/07/2020: Normal LV systolic function with visual EF 60-65%. No obvious regional wall motion abnormalities. Left ventricle cavity is normal in size. Moderate left ventricular hypertrophy. Indeterminate diastolic filling pattern. Calculated EF 55%. Left atrial cavity is moderately dilated. Mild (Grade I) mitral regurgitation. Mild tricuspid regurgitation. No prior study for comparison.    Physical Exam: Temp 98.4 F (36.9 C)   Ht 5' 11" (1.803 m)   Wt 274 lb 12.8 oz (124.6 kg)   BMI 38.33 kg/m  Constitutional: Pleasant,well-developed, male in no acute distress. Abdominal: Soft, nondistended, left flank pain - chronic, no peritoneal signs.There are no masses palpable.  Extremities: no edema Lymphadenopathy: No cervical adenopathy noted. Neurological: Alert and oriented to person place and time. Skin: Skin is warm and dry. No rashes noted. Psychiatric: Normal mood and affect. Behavior is normal.   ASSESSMENT AND PLAN: 59 year old male here for reassessment of the following issues:  Change in bowel habits / fecal incontinence - he had a clear marked change in his bowel habits since November after course of antibiotics.  Normally has baseline constipation and now with persistent loose stools with urgency and incontinence.  I am concerned he may have colitis.  This would be unusual to be infectious given the duration of symptoms but in light of his history of antibiotic use  will check a stool study to ensure no C. difficile.  We will also check for fecal lactoferrin and sent for CBC to check for leukocytosis and anemia.  He does have a chronic stable anemia over the years.  He has intermittent dark stools however it does not sound like true melena based on history, but will ensure hemoglobin stable. I will also check for TSH.  If infection is negative and the rest of his labs look okay then we will need to likely proceed with a colonoscopy/flex sig to further evaluate.  He agreed with the plan, further recommendations pending results of labs and stool testing, I will contact him with recommendations when I have the results.  Once infectious is ruled out he can start Imodium as needed. He agreed  Elevated alkaline phosphatase level - incidentally noted on labs in recent months.  We will send a GGT to clarify where this is coming from.  Further work-up as appropriate.  I spent 35 minutes of time, including in depth chart review, independent review of results as outlined above, communicating results with the patient directly, face-to-face time with the patient, coordinating care, and ordering studies and medications as appropriate, and documentation.   Benham Cellar, MD [facility]astroenterology

## 2020-02-11 NOTE — Patient Instructions (Addendum)
If you are age 57 or older, your body mass index should be between 23-30. Your Body mass index is 38.33 kg/m. If this is out of the aforementioned range listed, please consider follow up with your Primary Care Provider.  If you are age 57 or younger, your body mass index should be between 19-25. Your Body mass index is 38.33 kg/m. If this is out of the aformentioned range listed, please consider follow up with your Primary Care Provider.    Please go to the lab in the basement of our building to have lab work done as you leave today. Hit "B" for basement when you get on the elevator.  When the doors open the lab is on your left.  We will call you with the results. Thank you.  Due to recent changes in healthcare laws, you may see the results of your imaging and laboratory studies on MyChart before your provider has had a chance to review them.  We understand that in some cases there may be results that are confusing or concerning to you. Not all laboratory results come back in the same time frame and the provider may be waiting for multiple results in order to interpret others.  Please give [place] 48 hours in order for your provider to thoroughly review all the results before contacting the office for clarification of your results.   Thank you for entrusting me with your care and for choosing [facility], Dr. Berne Cellar

## 2020-02-12 ENCOUNTER — Telehealth: Payer: Self-pay

## 2020-02-12 NOTE — Telephone Encounter (Signed)
-----   Message from [place], [place] sent at 02/11/2020 11:33 PM EST ----- His kidney function is uptrending so we need to monitor it closely.  Have him increase his fluid intake by two 12oz of cups per day.  Repeat labs (ordered already) prior to the next visit. Check on 02/21/2020 appt on 22nd.

## 2020-02-12 NOTE — Telephone Encounter (Signed)
Spoke with patient about his kidney levels and advised him to increase his fluid intake and to get lab work done prior to [employer].

## 2020-02-13 ENCOUNTER — Other Ambulatory Visit: Payer: Self-pay

## 2020-02-13 DIAGNOSIS — R748 Abnormal levels of other serum enzymes: Secondary | ICD-10-CM

## 2020-02-18 ENCOUNTER — Encounter: Payer: Self-pay | Admitting: Medical

## 2020-02-19 ENCOUNTER — Other Ambulatory Visit: Payer: Self-pay

## 2020-02-19 ENCOUNTER — Telehealth: Payer: Self-pay | Admitting: Medical

## 2020-02-19 ENCOUNTER — Ambulatory Visit (HOSPITAL_COMMUNITY)
Admission: RE | Admit: 2020-02-19 | Discharge: 2020-02-19 | Disposition: A | Discharge status: Home / Self Care | Payer: Medicare Other | Source: Ambulatory Visit | Attending: Gastroenterology | Admitting: Gastroenterology

## 2020-02-19 DIAGNOSIS — R748 Abnormal levels of other serum enzymes: Secondary | ICD-10-CM

## 2020-02-19 DIAGNOSIS — F339 Major depressive disorder, recurrent, unspecified: Secondary | ICD-10-CM

## 2020-02-19 NOTE — Telephone Encounter (Signed)
Referral has been placed. 

## 2020-02-19 NOTE — Telephone Encounter (Signed)
See referral request  Please see if you can get him in with psychiatrist and counselor.   Try [facility] which is part of [facility] now I think.   Or try somebody else that will take his insurance

## 2020-02-19 NOTE — Telephone Encounter (Signed)
Pt called and is wanting to get set up with a psychiatrist pt now has Medicaid and medicare pt can be reached at [phone]

## 2020-02-20 ENCOUNTER — Other Ambulatory Visit: Payer: Self-pay

## 2020-02-20 ENCOUNTER — Telehealth: Payer: Self-pay

## 2020-02-20 DIAGNOSIS — R748 Abnormal levels of other serum enzymes: Secondary | ICD-10-CM

## 2020-02-20 DIAGNOSIS — R194 Change in bowel habit: Secondary | ICD-10-CM

## 2020-02-20 NOTE — Telephone Encounter (Signed)
Left message for patient to please call back. 

## 2020-02-24 ENCOUNTER — Telehealth: Payer: Self-pay

## 2020-02-24 ENCOUNTER — Ambulatory Visit: Payer: Medicare Other | Admitting: Cardiology

## 2020-02-24 NOTE — Telephone Encounter (Signed)
Called patient and left message on his voice mail again, to please call back. Trying to give him the results from his [place] on 02/19/20 and some blood work and stool test Dr. Havery Moros wants him to have

## 2020-02-24 NOTE — Progress Notes (Deleted)
No chief complaint on file.   REQUESTING PHYSICIAN:  Carlena Hurl, PA-C [place],  [place] [address]  HPI  Brandon [name] is a 59 y.o. male who presents to the office with a chief complaint of "***."  Patient's past medical history and cardiac risk factors include: Insulin-dependent diabetes mellitus type 2, hypertension, hyperlipidemia, prior stroke, erectile dysfunction, chronic kidney disease stage III, history of renal cell carcinoma.  Hypertension:  Patient was last seen in the office for management of high blood pressure shortness of breath.  At the last office visit he stated that his systolic blood pressure ranges between 163-845 mmHg and diastolic blood pressures range between 60-75 mmHg and he thought that this was well controlled blood pressures. I had spent significant amount of time at today's office visit educated him regarding the normal ranges of blood pressures given his age and comorbid conditions.  Patient is also educated on improving diet that is low in salt.  He has history of renal cell carcinoma and currently has chronic kidney disease stage III.  At the last visit, patient informed me that his primary care provider had prescribed Norvasc 10 mg/telmisartan 40 mg p.o. daily but he did not started this during the last encounter with the patient.  He was instructed to start the Norvasc/telmisartan as prescribed by his primary care physician and I did initiate a low-dose hydrochlorothiazide 12.5 mg p.o. every morning as well. ***  Dyspnea on exertion: Patient states that he has been having shortness of breath for a long time.  It is mostly brought on by effort related activities and improves upon relaxing or resting.  Shortness of breath is not associated with any chest discomfort.  Patient has not taken any medications for the symptoms and they have not increased in intensity, frequency, or duration.  Patient states that he does have lower extremity  swelling bilaterally which is chronic and stable.  He denies any orthopnea or paroxysmal nocturnal dyspnea.  No prior history of congestive heart failure according to him.  Patient was instructed to undergo an echocardiogram and nuclear stress test given his symptoms are related dyspnea and multiple cardiovascular risk factors.  Since last office visit he has undergone echocardiogram results reviewed with the patient and noted below for further reference.  Patient did not undergo nuclear stress test ***.   Review of systems positive for: Dyspnea on exertion, lower extremity swelling bilaterally. *** Currently patient denies chest pain, lightheadedness, dizziness, palpitations, orthopnea, paroxysmal nocturnal dyspnea, near syncope, syncopal events, hematochezia, hemoptysis, hematemesis, melanotic stools, no symptoms of amaurosis fugax, motor or sensory symptoms or dysphasia in the last 6 months.   History of stroke. Denies prior history of coronary artery disease, myocardial infarction, congestive heart failure, deep venous thrombosis, pulmonary embolism, transient ischemic attack.  FUNCTIONAL STATUS: No structured exercise program.    ALLERGIES: Allergies  Allergen Reactions  . Morphine Itching   MEDICATION LIST PRIOR TO VISIT: Current Outpatient Medications on File Prior to Visit  Medication Sig Dispense Refill  . aspirin EC 81 MG tablet Take 1 tablet (81 mg total) by mouth daily. 90 tablet 3  . atorvastatin (LIPITOR) 40 MG tablet Take 1 tablet (40 mg total) by mouth daily. 90 tablet 3  . blood glucose meter kit and supplies Dispense based on patient and insurance preference. Use up to four times daily as directed. (FOR ICD-9 250.00, 250.01). 1 each 0  . ezetimibe (ZETIA) 10 MG tablet Take 1 tablet (10 mg total)  by mouth daily. 90 tablet 3  . glucose blood (ACCU-CHEK ACTIVE STRIPS) test strip Use as instructed 100 each 12  . glucose blood test strip accu chek active 1-2 times daily 100 each 12    . hydrochlorothiazide (MICROZIDE) 12.5 MG capsule Take 1 capsule (12.5 mg total) by mouth every morning. 90 capsule 0  . insulin aspart (NOVOLOG FLEXPEN) 100 UNIT/ML FlexPen Inject 15 Units into the skin 3 (three) times daily with meals. 15 mL 6  . insulin glargine (LANTUS SOLOSTAR) 100 UNIT/ML Solostar Pen Inject 60 Units into the skin daily. 15 pen 6  . Insulin Pen Needle (PEN NEEDLES) 32G X 4 MM MISC 1 each by Does not apply route daily. Use for insulin pens 200 each 2  . Lancets (ACCU-CHEK SOFT TOUCH) lancets Use as instructed 100 each 12  . levETIRAcetam (KEPPRA) 500 MG tablet Take 2 tablets (1,000 mg total) by mouth daily. (Patient taking differently: Take 500 mg by mouth daily. ) 60 tablet 11  . ondansetron (ZOFRAN) 4 MG tablet Take 1 tablet (4 mg total) by mouth daily as needed for nausea or vomiting. 30 tablet 1  . QUEtiapine (SEROQUEL) 25 MG tablet Take 1 tablet (25 mg total) by mouth daily. 30 tablet 1  . Semaglutide, 1 MG/DOSE, (OZEMPIC, 1 MG/DOSE,) 2 MG/1.5ML SOPN Inject 2 mg into the skin once a week. 3 pen 3  . sertraline (ZOLOFT) 100 MG tablet Take 1 tablet (100 mg total) by mouth daily. 30 tablet 1   No current facility-administered medications on file prior to visit.    PAST MEDICAL HISTORY: Past Medical History:  Diagnosis Date  . Acute ischemic stroke ([place]) 11/2013  . Allergy   . Anxiety   . Arthritis   . Benign hypertension with CKD (chronic kidney disease) stage III   . 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 CVA (cerebral vascular accident) ([place])    stroke  . Depression    BH Adm in [employer] Depression  . Diabetes mellitus    DKA prior hospitalization  . Diverticulitis    s/p micorperforation Sept 2012-managed conservatively by Gen surgery  . ED (erectile dysfunction)   . Focal seizure ([place]) 11/2013   due to ischemic stroke  . GERD (gastroesophageal reflux disease)   . Hiatal hernia   . Hyperlipidemia   . Hypertension    . Kidney tumor 09/2011   Renal cell CA  . Seizures ([place])    none since 2016, taking Keppra - maw  . Sleep apnea   . Thyroid disease    "weak thyroid" per MD  . Wears glasses     PAST SURGICAL HISTORY: Past Surgical History:  Procedure Laterality Date  . COLONOSCOPY    . IR RADIOLOGIST EVAL & MGMT  04/04/2017  . KIDNEY SURGERY     ablation of renal cell CA - 12/28, prior one was October 2012-Dr. Kathlene Cote  . TEE WITHOUT CARDIOVERSION N/A 11/19/2013   Procedure: TRANSESOPHAGEAL ECHOCARDIOGRAM (TEE);  Surgeon: Lelon Perla, MD;  Location: [facility] ENDOSCOPY;  Service: Cardiovascular;  Laterality: N/A;    FAMILY HISTORY: The patient family history includes Breast cancer in his sister; Diabetes in his father; Heart disease in his father; Pneumonia in his mother; Prostate cancer in an other family member; Stomach cancer in an other family member.   SOCIAL HISTORY:  The patient  reports that he has never smoked. He has never used smokeless tobacco. He reports previous alcohol  use. He reports that he does not use drugs.  14 ORGAN REVIEW OF SYSTEMS: CONSTITUTIONAL: No fever or significant weight loss EYES: No recent significant visual change EARS, NOSE, MOUTH, THROAT: No recent significant change in hearing CARDIOVASCULAR: See discussion in subjective/HPI RESPIRATORY: See discussion in subjective/HPI GASTROINTESTINAL: No recent complaints of abdominal pain GENITOURINARY: No recent significant change in genitourinary status MUSCULOSKELETAL: No recent significant change in musculoskeletal status INTEGUMENTARY: No recent rash NEUROLOGIC: No recent significant change in motor function PSYCHIATRIC: No recent significant change in mood ENDOCRINOLOGIC: No recent significant change in endocrine status HEMATOLOGIC/LYMPHATIC: No recent significant unexpected bruising ALLERGIC/IMMUNOLOGIC: No recent unexplained allergic reaction  PHYSICAL EXAM: Vitals with BMI 02/11/2020 02/06/2020 01/27/2020   Height '5\' 11"'  '5\' 11"'  -  Weight 274 lbs 13 oz 284 lbs 10 oz -  BMI 17.00 17.49 -  Systolic 449 675 916  Diastolic 90 82 98  Pulse 68 68 74  Some encounter information is confidential and restricted. Go to Review Flowsheets activity to see all data.   CONSTITUTIONAL: Well-developed and well-nourished. No acute distress.  SKIN: Skin is warm and dry. No rash noted. No cyanosis. No pallor. No jaundice HEAD: Normocephalic and atraumatic.  EYES: No scleral icterus MOUTH/THROAT: Moist oral membranes.  NECK: No JVD present. No thyromegaly noted. No carotid bruits  LYMPHATIC: No visible cervical adenopathy.  CHEST Normal respiratory effort. No intercostal retractions  LUNGS: Clear to auscultation bilaterally.  No stridor. No wheezes. No rales.  CARDIOVASCULAR: Regular rate and rhythm, positive B8-G6, soft systolic ejection murmur over the left sternal border, no gallops or rubs appreciated. ABDOMINAL: Obese, soft, nontender, nondistended, positive bowel sounds in all 4 quadrants.  No apparent ascites.  EXTREMITIES: Dry skin noted bilaterally, +1 pitting edema bilaterally.  HEMATOLOGIC: No significant bruising NEUROLOGIC: Oriented to person, place, and time. Nonfocal.  Cranial nerves II to XII are grossly intact. PSYCHIATRIC: Normal mood and affect. Normal behavior. Cooperative  CARDIAC DATABASE: EKG: 02/05/2018: Normal sinus rhythm with a ventricular rate of 76bpm, first-degree AV block with PR interval 218 ms, QS patterns in lead V1-V2 suggestive of old septal infarct.  Without underlying injury pattern. 01/27/2020: Normal sinus rhythm with a ventricular rate of 74 bpm, first-degree AV block with PR interval of 215 4 ms, poor R wave progression, QS pattern in anteroseptal leads suggestive of possible old anterior infarct, without underlying injury pattern.  Echocardiogram: TEE 11/19/2013: Per report LVEF 65 to 70%, wall motion is normal, mild mitral valve prolapse, No bidirectional atrial level  per agitated saline study, and no significant valvular heart disease.  02/05/2020: LVEF 55 to 60%, no regional wall motion abnormalities, grade 2 diastolic dysfunction, mild MR, mild PR, moderate TR, RVSP 37 mmHg consistent with mild pulmonary hypertension. ***  Stress Testing: None  Heart Catheterization: 1990s in [place], [place].   Carotid duplex: 11/2013: Bilateral 1-39% ICA stenosis. Vertebral flow is antegrade.  Vascular imaging: 04/2016 ABI: ABIs, pedal waveforms, and TBIs are within normal limits.   LABORATORY DATA: CBC Latest Ref Rng & Units 02/11/2020 12/31/2019 07/03/2019  WBC 4.0 - 10.5 K/uL 5.0 7.2 5.2  Hemoglobin 13.0 - 17.0 g/dL 12.8(L) 11.9(L) 10.7(L)  Hematocrit 39.0 - 52.0 % 36.8(L) 35.9(L) 33.0(L)  Platelets 150.0 - 400.0 K/uL 238.0 201 203    CMP Latest Ref Rng & Units 02/05/2020 12/31/2019 07/03/2019  Glucose 65 - 99 mg/dL 181(H) 496(H) 355(H)  BUN 6 - 24 mg/dL 29(H) 23(H) 20  Creatinine 0.76 - 1.27 mg/dL 1.77(H) 1.58(H) 1.67(H)  Sodium 134 - 144  mmol/L 141 135 136  Potassium 3.5 - 5.2 mmol/L 4.0 4.3 4.7  Chloride 96 - 106 mmol/L 98 102 98  CO2 20 - 29 mmol/L '23 23 24  ' Calcium 8.7 - 10.2 mg/dL 9.6 9.3 9.2  Total Protein 6.0 - 8.5 g/dL 8.0 7.7 7.0  Total Bilirubin 0.0 - 1.2 mg/dL 0.3 0.9 0.4  Alkaline Phos 39 - 117 IU/L 160(H) 148(H) 130(H)  AST 0 - 40 IU/L 38 22 13  ALT 0 - 44 IU/L 42 25 13    Lipid Panel     Component Value Date/Time   CHOL 162 01/09/2020 1143   TRIG 79 01/09/2020 1143   HDL 40 01/09/2020 1143   CHOLHDL 4.1 01/09/2020 1143   CHOLHDL 4.0 12/29/2016 1144   VLDL 25 12/29/2016 1144   LDLCALC 107 (H) 01/09/2020 1143   LABVLDL 15 01/09/2020 1143    Lab Results  Component Value Date   HGBA1C 11.7 (H) 01/09/2020   HGBA1C 8.6 (H) 07/03/2019   HGBA1C 11.4 (H) 02/02/2018   No components found for: NTPROBNP Lab Results  Component Value Date   TSH 2.25 02/11/2020   TSH 0.41 12/29/2016   TSH 1.339 03/02/2016    FINAL MEDICATION LIST END OF  ENCOUNTER: No orders of the defined types were placed in this encounter.   There are no discontinued medications.   Current Outpatient Medications:  .  aspirin EC 81 MG tablet, Take 1 tablet (81 mg total) by mouth daily., Disp: 90 tablet, Rfl: 3 .  atorvastatin (LIPITOR) 40 MG tablet, Take 1 tablet (40 mg total) by mouth daily., Disp: 90 tablet, Rfl: 3 .  blood glucose meter kit and supplies, Dispense based on patient and insurance preference. Use up to four times daily as directed. (FOR ICD-9 250.00, 250.01)., Disp: 1 each, Rfl: 0 .  ezetimibe (ZETIA) 10 MG tablet, Take 1 tablet (10 mg total) by mouth daily., Disp: 90 tablet, Rfl: 3 .  glucose blood (ACCU-CHEK ACTIVE STRIPS) test strip, Use as instructed, Disp: 100 each, Rfl: 12 .  glucose blood test strip, accu chek active 1-2 times daily, Disp: 100 each, Rfl: 12 .  hydrochlorothiazide (MICROZIDE) 12.5 MG capsule, Take 1 capsule (12.5 mg total) by mouth every morning., Disp: 90 capsule, Rfl: 0 .  insulin aspart (NOVOLOG FLEXPEN) 100 UNIT/ML FlexPen, Inject 15 Units into the skin 3 (three) times daily with meals., Disp: 15 mL, Rfl: 6 .  insulin glargine (LANTUS SOLOSTAR) 100 UNIT/ML Solostar Pen, Inject 60 Units into the skin daily., Disp: 15 pen, Rfl: 6 .  Insulin Pen Needle (PEN NEEDLES) 32G X 4 MM MISC, 1 each by Does not apply route daily. Use for insulin pens, Disp: 200 each, Rfl: 2 .  Lancets (ACCU-CHEK SOFT TOUCH) lancets, Use as instructed, Disp: 100 each, Rfl: 12 .  levETIRAcetam (KEPPRA) 500 MG tablet, Take 2 tablets (1,000 mg total) by mouth daily. (Patient taking differently: Take 500 mg by mouth daily. ), Disp: 60 tablet, Rfl: 11 .  ondansetron (ZOFRAN) 4 MG tablet, Take 1 tablet (4 mg total) by mouth daily as needed for nausea or vomiting., Disp: 30 tablet, Rfl: 1 .  QUEtiapine (SEROQUEL) 25 MG tablet, Take 1 tablet (25 mg total) by mouth daily., Disp: 30 tablet, Rfl: 1 .  Semaglutide, 1 MG/DOSE, (OZEMPIC, 1 MG/DOSE,) 2 MG/1.5ML  SOPN, Inject 2 mg into the skin once a week., Disp: 3 pen, Rfl: 3 .  sertraline (ZOLOFT) 100 MG tablet, Take 1 tablet (100 mg total) by  mouth daily., Disp: 30 tablet, Rfl: 1  IMPRESSION:    ICD-10-CM   1. Dyspnea on exertion  D62.22 Basic Metabolic Panel (BMET)  2. Benign hypertension with CKD (chronic kidney disease) stage III  L79.8 Basic Metabolic Panel (BMET)   X21.19      RECOMMENDATIONS: Benign essential hypertension with stage IIIa chronic kidney disease:  Patient's home blood pressures are currently not at goal and this was discussed in detail with him at today's office visit.  Patient is encouraged to invest in a blood pressure cuff and to keep a log of his blood pressures and to bring it in at the next office visit so his medications can further be titrated.  I agree with the primary care in regards to the addition, ARB's given his history of diabetes and prior stroke.  However, patient has not started this medication at the time of this encounter.  Patient is encouraged to go pick up this medication and see how he tolerates the medication to trend his blood pressures.  Patient's blood pressure is currently more than 20 mmHg from his goal and I believe that he will require additional antihypertensive medications.  Given his lower extremity swelling we will start the patient on low-dose hydrochlorothiazide 12.5 mg p.o. every morning.  Blood pressure labs in 1 week to reevaluate kidney function and electrolytes.  Dyspnea on exertion:  Could be secondary to elevated blood pressures contributing to elevated filling pressures.  However, given the patient's history of prior stroke, uncontrolled insulin-dependent diabetes mellitus type 2 with most recent hemoglobin A1c of 11.7, family history of heart disease with dad having heart disease younger than the age of 10 would recommend an ischemic evaluation.  Patient is agreeable to proceed with nuclear stress test to rule out ischemic  burden.  Plan echocardiogram to evaluate for structural heart disease and LV function.  And will start hydrochlorothiazide 12.5 mg p.o. every morning.  Hyperlipidemia in the setting of insulin-dependent diabetes mellitus type 2:  Most recent LDL is 92 mg/dL, non-HDL-C 122 mg/dL, currently on atorvastatin 40 mg p.o. nightly.  Recommending the initiation of Zetia 10 mg p.o. daily with a goal LDL of less than 70 mg/dL.  Continue to monitor.  History of CVA:  Educated on importance of secondary prevention and modifications of his cardiovascular risk factors.  Continue aspirin 81 mg p.o. daily.  Continue statin therapy.  We will start Zetia.  Educated on lifestyle modifications.  Family history of premature coronary artery disease: Continue risk factor modifications.  Insulin-dependent diabetes mellitus type 2: Currently managed by primary team.   No orders of the defined types were placed in this encounter.  --Continue cardiac medications as reconciled in final medication list. --No follow-ups on file.. Or sooner if needed. --Continue follow-up with your primary care physician regarding the management of your other chronic comorbid conditions.  Patient's questions and concerns were addressed to his satisfaction. He voices understanding of the instructions provided during this encounter.   This note was created using a voice recognition software as a result there may be grammatical errors inadvertently enclosed that do not reflect the nature of this encounter. Every attempt is made to correct such errors.  Rex Kras, DO, [facility]. [place] Office: [phone]

## 2020-02-25 ENCOUNTER — Other Ambulatory Visit: Payer: Self-pay

## 2020-02-25 DIAGNOSIS — R748 Abnormal levels of other serum enzymes: Secondary | ICD-10-CM

## 2020-02-25 NOTE — Telephone Encounter (Signed)
Patient notified of the results and recommendations on the [place] from 3/17.  He will come for the labs and the stool studies tomorrow.

## 2020-02-25 NOTE — Telephone Encounter (Signed)
Patient is returning the call for results.

## 2020-03-14 ENCOUNTER — Ambulatory Visit: Payer: Medicare Other

## 2020-03-14 ENCOUNTER — Other Ambulatory Visit: Payer: Self-pay

## 2020-03-14 ENCOUNTER — Ambulatory Visit: Payer: Medicare Other | Attending: Internal Medicine

## 2020-03-14 DIAGNOSIS — Z23 Encounter for immunization: Secondary | ICD-10-CM

## 2020-03-14 NOTE — Progress Notes (Signed)
   Covid-19 Vaccination Clinic  Name:  CHESNEY KLIMASZEWSKI    MRN: [id number] DOB: 1961-05-05  03/14/2020  Mr. Aleshire was observed post Covid-19 immunization for 15 minutes without incident. He was provided with Vaccine Information Sheet and instruction to access the V-Safe system.   Mr. Dupler was instructed to call 911 with any severe reactions post vaccine: [name] Difficulty breathing  . Swelling of face and throat  . A fast heartbeat  . A bad rash all over body  . Dizziness and weakness   Immunizations Administered    Name Date Dose VIS Date Route   Pfizer COVID-19 Vaccine 03/14/2020  8:58 AM 0.3 mL 11/15/2019 Intramuscular   Manufacturer: [place]   Lot: [id number]   [place]: [id number]

## 2020-03-30 ENCOUNTER — Telehealth: Payer: Self-pay | Admitting: Medical

## 2020-03-30 ENCOUNTER — Other Ambulatory Visit: Payer: Self-pay | Admitting: Medical

## 2020-03-30 MED ORDER — OMEPRAZOLE 40 MG PO CPDR: 40.0000 mg | DELAYED_RELEASE_CAPSULE | Freq: Every day | ORAL | 1 refills | Status: DC

## 2020-03-30 NOTE — Telephone Encounter (Signed)
Brandon [name] called and states that Brandon [name] has had acid reflux for about 4 to 5 days and it is burning she wants to know if you will send him something in she can be reached at [phone] and pt uses [id number] - [place], [place].

## 2020-04-05 ENCOUNTER — Encounter (HOSPITAL_COMMUNITY): Payer: Self-pay | Admitting: Emergency Medicine

## 2020-04-05 ENCOUNTER — Emergency Department (HOSPITAL_COMMUNITY): Payer: Medicare Other

## 2020-04-05 ENCOUNTER — Observation Stay (HOSPITAL_COMMUNITY)
Admission: EM | Admit: 2020-04-05 | Discharge: 2020-04-07 | Disposition: A | Discharge status: Home / Self Care | Payer: Medicare Other | Attending: Internal Medicine | Admitting: Internal Medicine

## 2020-04-05 ENCOUNTER — Other Ambulatory Visit: Payer: Self-pay

## 2020-04-05 DIAGNOSIS — N1832 Chronic kidney disease, stage 3b: Secondary | ICD-10-CM | POA: Diagnosis not present

## 2020-04-05 DIAGNOSIS — N401 Enlarged prostate with lower urinary tract symptoms: Secondary | ICD-10-CM | POA: Insufficient documentation

## 2020-04-05 DIAGNOSIS — Z794 Long term (current) use of insulin: Secondary | ICD-10-CM

## 2020-04-05 DIAGNOSIS — G8929 Other chronic pain: Secondary | ICD-10-CM | POA: Insufficient documentation

## 2020-04-05 DIAGNOSIS — Z8249 Family history of ischemic heart disease and other diseases of the circulatory system: Secondary | ICD-10-CM | POA: Insufficient documentation

## 2020-04-05 DIAGNOSIS — I129 Hypertensive chronic kidney disease with stage 1 through stage 4 chronic kidney disease, or unspecified chronic kidney disease: Secondary | ICD-10-CM | POA: Diagnosis not present

## 2020-04-05 DIAGNOSIS — R3912 Poor urinary stream: Secondary | ICD-10-CM | POA: Insufficient documentation

## 2020-04-05 DIAGNOSIS — Z85528 Personal history of other malignant neoplasm of kidney: Secondary | ICD-10-CM | POA: Insufficient documentation

## 2020-04-05 DIAGNOSIS — I69354 Hemiplegia and hemiparesis following cerebral infarction affecting left non-dominant side: Secondary | ICD-10-CM | POA: Diagnosis not present

## 2020-04-05 DIAGNOSIS — N433 Hydrocele, unspecified: Secondary | ICD-10-CM | POA: Insufficient documentation

## 2020-04-05 DIAGNOSIS — E1122 Type 2 diabetes mellitus with diabetic chronic kidney disease: Secondary | ICD-10-CM | POA: Diagnosis not present

## 2020-04-05 DIAGNOSIS — M1990 Unspecified osteoarthritis, unspecified site: Secondary | ICD-10-CM | POA: Diagnosis not present

## 2020-04-05 DIAGNOSIS — G4733 Obstructive sleep apnea (adult) (pediatric): Secondary | ICD-10-CM | POA: Diagnosis not present

## 2020-04-05 DIAGNOSIS — R93811 Abnormal radiologic findings on diagnostic imaging of right testicle: Secondary | ICD-10-CM | POA: Diagnosis not present

## 2020-04-05 DIAGNOSIS — I10 Essential (primary) hypertension: Secondary | ICD-10-CM | POA: Diagnosis present

## 2020-04-05 DIAGNOSIS — G40909 Epilepsy, unspecified, not intractable, without status epilepticus: Secondary | ICD-10-CM | POA: Diagnosis not present

## 2020-04-05 DIAGNOSIS — Z7982 Long term (current) use of aspirin: Secondary | ICD-10-CM | POA: Insufficient documentation

## 2020-04-05 DIAGNOSIS — E1142 Type 2 diabetes mellitus with diabetic polyneuropathy: Secondary | ICD-10-CM | POA: Diagnosis not present

## 2020-04-05 DIAGNOSIS — R350 Frequency of micturition: Secondary | ICD-10-CM | POA: Insufficient documentation

## 2020-04-05 DIAGNOSIS — F419 Anxiety disorder, unspecified: Secondary | ICD-10-CM | POA: Insufficient documentation

## 2020-04-05 DIAGNOSIS — N179 Acute kidney failure, unspecified: Secondary | ICD-10-CM | POA: Diagnosis present

## 2020-04-05 DIAGNOSIS — R3915 Urgency of urination: Secondary | ICD-10-CM | POA: Insufficient documentation

## 2020-04-05 DIAGNOSIS — F329 Major depressive disorder, single episode, unspecified: Secondary | ICD-10-CM | POA: Insufficient documentation

## 2020-04-05 DIAGNOSIS — Z885 Allergy status to narcotic agent status: Secondary | ICD-10-CM | POA: Insufficient documentation

## 2020-04-05 DIAGNOSIS — K5730 Diverticulosis of large intestine without perforation or abscess without bleeding: Secondary | ICD-10-CM | POA: Insufficient documentation

## 2020-04-05 DIAGNOSIS — Z03818 Encounter for observation for suspected exposure to other biological agents ruled out: Secondary | ICD-10-CM | POA: Diagnosis not present

## 2020-04-05 DIAGNOSIS — N50812 Left testicular pain: Secondary | ICD-10-CM | POA: Insufficient documentation

## 2020-04-05 DIAGNOSIS — I700 Atherosclerosis of aorta: Secondary | ICD-10-CM | POA: Insufficient documentation

## 2020-04-05 DIAGNOSIS — K219 Gastro-esophageal reflux disease without esophagitis: Secondary | ICD-10-CM | POA: Insufficient documentation

## 2020-04-05 DIAGNOSIS — E1159 Type 2 diabetes mellitus with other circulatory complications: Secondary | ICD-10-CM | POA: Diagnosis not present

## 2020-04-05 DIAGNOSIS — Z20822 Contact with and (suspected) exposure to covid-19: Secondary | ICD-10-CM | POA: Diagnosis not present

## 2020-04-05 DIAGNOSIS — E1165 Type 2 diabetes mellitus with hyperglycemia: Secondary | ICD-10-CM | POA: Diagnosis not present

## 2020-04-05 DIAGNOSIS — Z8673 Personal history of transient ischemic attack (TIA), and cerebral infarction without residual deficits: Secondary | ICD-10-CM

## 2020-04-05 DIAGNOSIS — R739 Hyperglycemia, unspecified: Secondary | ICD-10-CM

## 2020-04-05 DIAGNOSIS — R109 Unspecified abdominal pain: Secondary | ICD-10-CM | POA: Diagnosis present

## 2020-04-05 DIAGNOSIS — E785 Hyperlipidemia, unspecified: Secondary | ICD-10-CM | POA: Diagnosis not present

## 2020-04-05 DIAGNOSIS — Z833 Family history of diabetes mellitus: Secondary | ICD-10-CM | POA: Insufficient documentation

## 2020-04-05 DIAGNOSIS — E1169 Type 2 diabetes mellitus with other specified complication: Secondary | ICD-10-CM | POA: Diagnosis present

## 2020-04-05 DIAGNOSIS — Z79899 Other long term (current) drug therapy: Secondary | ICD-10-CM | POA: Insufficient documentation

## 2020-04-05 DIAGNOSIS — F339 Major depressive disorder, recurrent, unspecified: Secondary | ICD-10-CM | POA: Diagnosis present

## 2020-04-05 DIAGNOSIS — I152 Hypertension secondary to endocrine disorders: Secondary | ICD-10-CM | POA: Diagnosis present

## 2020-04-05 DIAGNOSIS — N1831 Chronic kidney disease, stage 3a: Secondary | ICD-10-CM

## 2020-04-05 LAB — CBC
HCT: 34.1 % — ABNORMAL LOW (ref 39.0–52.0)
Hemoglobin: 11.6 g/dL — ABNORMAL LOW (ref 13.0–17.0)
MCH: 30.1 pg (ref 26.0–34.0)
MCHC: 34 g/dL (ref 30.0–36.0)
MCV: 88.6 fL (ref 80.0–100.0)
Platelets: 212 10*3/uL (ref 150–400)
RBC: 3.85 MIL/uL — ABNORMAL LOW (ref 4.22–5.81)
RDW: 11.5 % (ref 11.5–15.5)
WBC: 5.6 10*3/uL (ref 4.0–10.5)
nRBC: 0 % (ref 0.0–0.2)

## 2020-04-05 LAB — COMPREHENSIVE METABOLIC PANEL
ALT: 20 U/L (ref 0–44)
AST: 22 U/L (ref 15–41)
Albumin: 4.2 g/dL (ref 3.5–5.0)
Alkaline Phosphatase: 122 U/L (ref 38–126)
Anion gap: 9 (ref 5–15)
BUN: 24 mg/dL — ABNORMAL HIGH (ref 6–20)
CO2: 27 mmol/L (ref 22–32)
Calcium: 8.9 mg/dL (ref 8.9–10.3)
Chloride: 97 mmol/L — ABNORMAL LOW (ref 98–111)
Creatinine, Ser: 2.48 mg/dL — ABNORMAL HIGH (ref 0.61–1.24)
GFR calc Af Amer: 32 mL/min — ABNORMAL LOW (ref >60)
GFR calc non Af Amer: 28 mL/min — ABNORMAL LOW (ref >60)
Glucose, Bld: 499 mg/dL — ABNORMAL HIGH (ref 70–99)
Potassium: 3.8 mmol/L (ref 3.5–5.1)
Sodium: 133 mmol/L — ABNORMAL LOW (ref 135–145)
Total Bilirubin: 1.2 mg/dL (ref 0.3–1.2)
Total Protein: 7.6 g/dL (ref 6.5–8.1)

## 2020-04-05 LAB — URINALYSIS, ROUTINE W REFLEX MICROSCOPIC
Bacteria, UA: NONE SEEN
Bilirubin Urine: NEGATIVE
Glucose, UA: >=500 mg/dL — AB
Hgb urine dipstick: NEGATIVE
Ketones, ur: NEGATIVE mg/dL
Leukocytes,Ua: NEGATIVE
Nitrite: NEGATIVE
Protein, ur: >=300 mg/dL — AB
Specific Gravity, Urine: 1.024 (ref 1.005–1.030)
pH: 5 (ref 5.0–8.0)

## 2020-04-05 LAB — LIPASE, BLOOD: Lipase: 34 U/L (ref 11–51)

## 2020-04-05 MED ORDER — INSULIN ASPART 100 UNIT/ML ~~LOC~~ SOLN
4.0000 [IU] | Freq: Once | SUBCUTANEOUS | Status: AC
  Administered 2020-04-05: 4 [IU] via INTRAVENOUS
  Filled 2020-04-05: qty 0.04

## 2020-04-05 MED ORDER — SODIUM CHLORIDE 0.9 % IV BOLUS
1000.0000 mL | Freq: Once | INTRAVENOUS | Status: AC
  Administered 2020-04-05: 1000 mL via INTRAVENOUS

## 2020-04-05 MED ORDER — LEVOFLOXACIN IN D5W 500 MG/100ML IV SOLN
500.0000 mg | Freq: Once | INTRAVENOUS | Status: AC
  Administered 2020-04-06: 500 mg via INTRAVENOUS
  Filled 2020-04-05: qty 100

## 2020-04-05 MED ORDER — FENTANYL CITRATE (PF) 100 MCG/2ML IJ SOLN
50.0000 ug | Freq: Once | INTRAMUSCULAR | Status: AC
  Administered 2020-04-05: 50 ug via INTRAVENOUS
  Filled 2020-04-05: qty 2

## 2020-04-05 MED ORDER — ONDANSETRON HCL 4 MG/2ML IJ SOLN
4.0000 mg | Freq: Once | INTRAMUSCULAR | Status: AC
  Administered 2020-04-05: 4 mg via INTRAVENOUS
  Filled 2020-04-05: qty 2

## 2020-04-05 MED ORDER — SODIUM CHLORIDE 0.9% FLUSH
3.0000 mL | Freq: Once | INTRAVENOUS | Status: AC
  Administered 2020-04-05: 3 mL via INTRAVENOUS

## 2020-04-05 NOTE — ED Triage Notes (Signed)
Patient is complaining of left lower abdominal pain, testicle pain, and left flank pain. Patient last BM was two days ago. Patient is not having any trouble urinating. Patient state all this started three days ago.

## 2020-04-05 NOTE — ED Notes (Signed)
Pt states that he cant't urinate at this time.

## 2020-04-05 NOTE — ED Provider Notes (Signed)
[facility] DEPT Provider Note   CSN: [id number] Arrival date & time: 04/05/20  2103     History Chief Complaint  Patient presents with  . Abdominal Pain  . Flank Pain    Brandon [name] is a 59 y.o. male past medical history of stroke, CVA, diabetes, diverticulitis with microperforation, erectile dysfunction, hypertension who presents for evaluation of left lower quadrant abdominal pain, testicular pain.  He states that initially the pain began about 2 to 3 days ago.  He reports that today, the pain got severely worse, prompting ED visit.  He also reports that he has pain and swelling to his left testicle.  He states he still been able to urinate and has not noted any dysuria or hematuria.  His last bowel movement was about 2 days ago.  It was normal and no blood.  He has also had vomiting that began today.  He reports that he has not really been able to tolerate much p.o. no blood noted in emesis.  His last bowel movement was about 2 days ago.  He states that there was no blood in the bowel movement.  He is still passing gas.  No fevers.  He denies any chest pain, difficulty breathing.  The history is provided by the patient.       Past Medical History:  Diagnosis Date  . Acute ischemic stroke ([place]) 11/2013  . Allergy   . Anxiety   . Arthritis   . Benign hypertension with CKD (chronic kidney disease) stage III   . 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 CVA (cerebral vascular accident) ([place])    stroke  . Depression    BH Adm in [place] Depression  . Diabetes mellitus    DKA prior hospitalization  . Diverticulitis    s/p micorperforation Sept 2012-managed conservatively by Gen surgery  . ED (erectile dysfunction)   . Focal seizure ([place]) 11/2013   due to ischemic stroke  . GERD (gastroesophageal reflux disease)   . Hiatal hernia   . Hyperlipidemia   . Hypertension   . Kidney tumor 09/2011   Renal cell CA   . Seizures ([place])    none since 2016, taking Keppra - maw  . Sleep apnea   . Thyroid disease    "weak thyroid" per MD  . Wears glasses     Patient Active Problem List   Diagnosis Date Noted  . Acute renal failure superimposed on stage 3a chronic kidney disease ([place]) 04/06/2020  . Abnormal radiologic findings on diagnostic imaging of right testicle 04/06/2020  . Type 2 diabetes mellitus with diabetic polyneuropathy, with long-term current use of insulin ([place]) 02/09/2020  . Type 2 diabetes mellitus with stage 3a chronic kidney disease, with long-term current use of insulin ([place]) 02/09/2020  . Family history of premature CAD 01/27/2020  . Stage 3b chronic kidney disease 01/09/2020  . Incontinence of feces 01/09/2020  . Chronic pain of left knee 01/09/2020  . Left leg pain 01/09/2020  . Fall 01/09/2020  . History of diverticulitis 10/30/2019  . Noncompliance 06/10/2019  . History of renal cell cancer 04/24/2018  . Erectile dysfunction 04/24/2018  . Edema 03/15/2018  . Seizure disorder ([place]) 03/01/2018  . History of stroke 11/29/2017  . Obstructive sleep apnea 11/29/2017  . Uncontrolled diabetes mellitus ([place]) 05/10/2017  . Dyslipidemia associated with type 2 diabetes mellitus ([place]) 05/10/2017  . Hypertension associated with diabetes ([place]) 05/10/2017  .  History of cerebrovascular accident (CVA) with residual deficit 05/10/2017  . Diabetic ulcer of left foot associated with secondary diabetes mellitus ([place]) 04/21/2016  . History of seizure 01/05/2016  . Depression, recurrent ([place]) 09/21/2014  . Benign hypertension with CKD (chronic kidney disease) stage III 12/30/2011    Past Surgical History:  Procedure Laterality Date  . COLONOSCOPY    . IR RADIOLOGIST EVAL & MGMT  04/04/2017  . KIDNEY SURGERY     ablation of renal cell CA - 12/28, prior one was October 2012-Dr. Kathlene Cote  . TEE WITHOUT CARDIOVERSION N/A 11/19/2013   Procedure: TRANSESOPHAGEAL ECHOCARDIOGRAM (TEE);  Surgeon:  Lelon Perla, MD;  Location: [facility] ENDOSCOPY;  Service: Cardiovascular;  Laterality: N/A;       Family History  Problem Relation Age of Onset  . Diabetes Father   . Heart disease Father   . Pneumonia Mother   . Prostate cancer Other   . Stomach cancer Other   . Breast cancer Sister   . Colon cancer Neg Hx   . Esophageal cancer Neg Hx   . Rectal cancer Neg Hx     Social History   Tobacco Use  . Smoking status: Never Smoker  . Smokeless tobacco: Never Used  Substance Use Topics  . Alcohol use: Not Currently    Comment: rarely  . Drug use: No    Home Medications Prior to Admission medications   Medication Sig Start Date End Date Taking? Authorizing Provider  amLODipine (NORVASC) 5 MG tablet Take 5 mg by mouth daily. 01/17/20  Yes [provider]  aspirin EC 81 MG tablet Take 1 tablet (81 mg total) by mouth daily. 01/09/20  Yes Tysinger, Camelia Eng, PA-C  atorvastatin (LIPITOR) 40 MG tablet Take 1 tablet (40 mg total) by mouth daily. 07/05/19  Yes Tysinger, Camelia Eng, PA-C  ezetimibe (ZETIA) 10 MG tablet Take 1 tablet (10 mg total) by mouth daily. 01/27/20 04/26/20 Yes Tolia, Sunit, DO  hydrochlorothiazide (MICROZIDE) 12.5 MG capsule Take 1 capsule (12.5 mg total) by mouth every morning. 01/27/20 04/26/20 Yes Tolia, Sunit, DO  insulin aspart (NOVOLOG FLEXPEN) 100 UNIT/ML FlexPen Inject 15 Units into the skin 3 (three) times daily with meals. 02/06/20  Yes Shamleffer, Melanie Crazier, MD  insulin glargine (LANTUS SOLOSTAR) 100 UNIT/ML Solostar Pen Inject 60 Units into the skin daily. Patient taking differently: Inject 45-60 Units into the skin daily.  02/06/20  Yes Shamleffer, Melanie Crazier, MD  levETIRAcetam (KEPPRA) 500 MG tablet Take 2 tablets (1,000 mg total) by mouth daily. Patient taking differently: Take 500 mg by mouth daily.  03/05/18  Yes Dennie Bible, NP  omeprazole (PRILOSEC) 40 MG capsule Take 1 capsule (40 mg total) by mouth daily. 03/30/20  Yes Tysinger, Camelia Eng,  PA-C  ondansetron (ZOFRAN) 4 MG tablet Take 1 tablet (4 mg total) by mouth daily as needed for nausea or vomiting. 01/09/20 01/08/21 Yes Tysinger, Camelia Eng, PA-C  QUEtiapine (SEROQUEL) 25 MG tablet Take 1 tablet (25 mg total) by mouth daily. 07/05/19  Yes Tysinger, Camelia Eng, PA-C  Semaglutide, 1 MG/DOSE, (OZEMPIC, 1 MG/DOSE,) 2 MG/1.5ML SOPN Inject 2 mg into the skin once a week. 01/09/20  Yes Tysinger, Camelia Eng, PA-C  sertraline (ZOLOFT) 100 MG tablet Take 1 tablet (100 mg total) by mouth daily. 07/05/19  Yes Tysinger, Camelia Eng, PA-C  blood glucose meter kit and supplies Dispense based on patient and insurance preference. Use up to four times daily as directed. (FOR ICD-9 250.00, 250.01). 04/24/16  Eugenie Filler, MD  glucose blood [facility] ACTIVE STRIPS) test strip Use as instructed 06/11/19   Tysinger, Camelia Eng, PA-C  glucose blood test strip accu chek active 1-2 times daily 01/17/20   Tysinger, Camelia Eng, PA-C  Insulin Pen Needle (PEN NEEDLES) 32G X 4 MM MISC 1 each by Does not apply route daily. Use for insulin pens 07/05/19   Tysinger, Camelia Eng, PA-C  Lancets (ACCU-CHEK SOFT TOUCH) lancets Use as instructed 06/11/19   Tysinger, Camelia Eng, PA-C    Allergies    Morphine  Review of Systems   Review of Systems  Constitutional: Negative for fever.  Respiratory: Negative for cough and shortness of breath.   Cardiovascular: Negative for chest pain.  Gastrointestinal: Positive for abdominal pain and nausea. Negative for vomiting.  Genitourinary: Positive for scrotal swelling and testicular pain. Negative for dysuria, flank pain and hematuria.  Neurological: Negative for headaches.  All other systems reviewed and are negative.   Physical Exam Updated Vital Signs BP 107/90 (BP Location: Left Arm)   Pulse 84   Temp 98.9 F (37.2 C) (Oral)   Resp 15   Ht 6' 0.5" (1.842 m)   Wt 104.3 kg   SpO2 99%   BMI 30.76 kg/m   Physical Exam Vitals and nursing note reviewed. Exam conducted with a chaperone  present.  Constitutional:      Appearance: Normal appearance. He is well-developed.  HENT:     Head: Normocephalic and atraumatic.  Eyes:     General: Lids are normal.     Conjunctiva/sclera: Conjunctivae normal.     Pupils: Pupils are equal, round, and reactive to light.  Cardiovascular:     Rate and Rhythm: Normal rate and regular rhythm.     Pulses: Normal pulses.     Heart sounds: Normal heart sounds. No murmur. No friction rub. No gallop.   Pulmonary:     Effort: Pulmonary effort is normal.     Breath sounds: Normal breath sounds.     Comments: Lungs clear to auscultation bilaterally.  Symmetric chest rise.  No wheezing, rales, rhonchi. Abdominal:     Palpations: Abdomen is soft. Abdomen is not rigid.     Tenderness: There is abdominal tenderness in the left lower quadrant. There is no left CVA tenderness or guarding.     Hernia: There is no hernia in the left inguinal area or right inguinal area.     Comments: Abdomen is soft, nondistended.  Tenderness noted to left lower quadrant.  No CVA tenderness.  No rigidity, guarding.  Genitourinary:    Testes:        Right: Tenderness not present.        Left: Tenderness present.     Comments: The exam was performed with a chaperone present. Normal male genitalia. No evidence of rash, ulcers or lesions.  Tenderness palpation noted to left testicle with some mild soft tissue swelling.  No overlying warmth or erythema.  No palpable mass. Musculoskeletal:        General: Normal range of motion.     Cervical back: Full passive range of motion without pain.  Skin:    General: Skin is warm and dry.     Capillary Refill: Capillary refill takes less than 2 seconds.  Neurological:     Mental Status: He is alert and oriented to person, place, and time.  Psychiatric:        Speech: Speech normal.     ED Results / Procedures / Treatments  Labs (all labs ordered are listed, but only abnormal results are displayed) Labs Reviewed    COMPREHENSIVE METABOLIC PANEL - Abnormal; Notable for the following components:      Result Value   Sodium 133 (*)    Chloride 97 (*)    Glucose, Bld 499 (*)    BUN 24 (*)    Creatinine, Ser 2.48 (*)    GFR calc non Af Amer 28 (*)    GFR calc Af Amer 32 (*)    All other components within normal limits  CBC - Abnormal; Notable for the following components:   RBC 3.85 (*)    Hemoglobin 11.6 (*)    HCT 34.1 (*)    All other components within normal limits  URINALYSIS, ROUTINE W REFLEX MICROSCOPIC - Abnormal; Notable for the following components:   Glucose, UA >=500 (*)    Protein, ur >=300 (*)    All other components within normal limits  RESPIRATORY PANEL BY RT PCR (FLU A&B, COVID)  LIPASE, BLOOD  TROPONIN I (HIGH SENSITIVITY)    EKG None  Radiology CT Renal Stone Study  Result Date: 04/05/2020 CLINICAL DATA:  59 year old male with abdominal pain. EXAM: CT ABDOMEN AND PELVIS WITHOUT CONTRAST TECHNIQUE: Multidetector CT imaging of the abdomen and pelvis was performed following the standard protocol without IV contrast. COMPARISON:  Abdominal ultrasound dated 02/19/2020 and CT dated 02/05/2018. FINDINGS: Evaluation of this exam is limited in the absence of intravenous contrast. Lower chest: The visualized lung bases are clear. Coronary vascular calcifications noted. No intra-abdominal free air or free fluid. Hepatobiliary: The liver is unremarkable. No intrahepatic biliary ductal dilatation. Probable minimal layering stone within the gallbladder. No pericholecystic fluid or evidence of acute cholecystitis by CT. Pancreas: Unremarkable. No pancreatic ductal dilatation or surrounding inflammatory changes. Spleen: Normal in size without focal abnormality. Adrenals/Urinary Tract: The adrenal glands are unremarkable. No significant interval change in areas of cryoablation in the kidneys bilaterally. No hydronephrosis or nephrolithiasis. The visualized ureters and urinary bladder appear  unremarkable. Stomach/Bowel: There is sigmoid diverticulosis without active inflammatory changes. There is no bowel obstruction or active inflammation. The appendix is normal. Vascular/Lymphatic: Mild aortoiliac atherosclerotic disease. The IVC is unremarkable. No portal venous gas. There is no adenopathy. Reproductive: The prostate and seminal vesicles are unremarkable. No pelvic mass. Other: None Musculoskeletal: Degenerative changes of the spine. No acute osseous pathology. IMPRESSION: 1. No acute intra-abdominal or pelvic pathology. 2. Sigmoid diverticulosis. No bowel obstruction. Normal appendix. 3. Aortic Atherosclerosis (ICD10-I70.0). 4. No significant interval change in the cryoablation areas in the bilateral kidneys. Electronically Signed   By: Anner Crete M.D.   On: 04/05/2020 23:05   US SCROTUM W/DOPPLER  Result Date: 04/05/2020 CLINICAL DATA:  59 year old male with left testicular pain. EXAM: SCROTAL ULTRASOUND DOPPLER ULTRASOUND OF THE TESTICLES TECHNIQUE: Complete ultrasound examination of the testicles, epididymis, and other scrotal structures was performed. Color and spectral Doppler ultrasound were also utilized to evaluate blood flow to the testicles. COMPARISON:  Testicular ultrasound dated 07/25/2016. FINDINGS: Right testicle Measurements: 4.0 x 2.0 x 2.4 cm. The right testicle is heterogeneous. There is a geographic area of decreased echogenicity within the right testicle. This may represent an area of edema related to underlying inflammation, although an infiltrative mass is not excluded. Correlation with clinical exam, and urinalysis and close follow-up with ultrasound after treatment and resolution of acute symptoms recommended. If findings persist on follow-up ultrasound, further evaluation with MRI is warranted. Left testicle Measurements: 4.3 x 1.7 x 2.7 cm.  No mass or microlithiasis visualized. Right epididymis:  Normal in size and appearance. Left epididymis:  Normal in size and  appearance. Hydrocele:  There is a small, mildly complex right hydrocele. Varicocele:  None visualized. Pulsed Doppler interrogation of both testes demonstrates normal low resistance arterial and venous waveforms bilaterally. IMPRESSION: 1. Heterogeneous right testicle as described above may be related to an inflammatory/infectious etiology. Clinical correlation and follow-up recommended. There is a small minimally complex right hydrocele. 2. Unremarkable left testicle. Electronically Signed   By: Anner Crete M.D.   On: 04/05/2020 22:36    Procedures Procedures (including critical care time)  Medications Ordered in ED Medications  levofloxacin (LEVAQUIN) IVPB 500 mg (500 mg Intravenous New Bag/Given 04/06/20 0003)  sodium chloride flush (NS) 0.9 % injection 3 mL (3 mLs Intravenous Given 04/05/20 2143)  fentaNYL (SUBLIMAZE) injection 50 mcg (50 mcg Intravenous Given 04/05/20 2206)  ondansetron (ZOFRAN) injection 4 mg (4 mg Intravenous Given 04/05/20 2206)  sodium chloride 0.9 % bolus 1,000 mL (1,000 mLs Intravenous New Bag/Given 04/05/20 2333)  insulin aspart (novoLOG) injection 4 Units (4 Units Intravenous Given 04/05/20 2342)  fentaNYL (SUBLIMAZE) injection 50 mcg (50 mcg Intravenous Given 04/05/20 2342)  ondansetron (ZOFRAN) injection 4 mg (4 mg Intravenous Given 04/05/20 2342)    ED Course  I have reviewed the triage vital signs and the nursing notes.  Pertinent labs & imaging results that were available during my care of the patient were reviewed by me and considered in my medical decision making (see chart for details).    MDM Rules/Calculators/A&P                      59 year old male who presents for evaluation of left-sided testicular pain, abdominal pain.  He reports vomiting that began today.  No fevers.  On initial ED arrival, he is afebrile, nontoxic-appearing.  Vital signs are stable.  On exam, he has left abdominal pain but no CVA tenderness.  He also has tenderness of the left testicle  some very mild overlying swelling.  Concern for diverticulitis versus kidney stone versus infectious process.  Plan to have labs, imaging.  Lipase is normal.  CBC shows no leukocytosis.  Hemoglobin is 11.6.  CMP shows BUN of 24 and creatinine of 2.48. This is slightly up from his baseline.  UA shows glucose, protein.  No evidence of infectious etiology.  Ultrasound shows heterogenous right testicle which may be inflammatory/infectious.  An infiltrative mass cannot be excluded.  He has a minimally complex right hydrocele.  CT renal study shows no acute intra-abdominal pelvic pathology.  He has sigmoid diverticulosis but no evidence of diverticulitis.  Normal appendix.  No evidence of bowel obstruction.  Patient still with pain and nausea. Repeat abdominal exam shows he is still tender. Discussed patient with Dr. Wyvonnia Dusky. Given AKI and continued pain will plan to admit for fluids and pain control. Patient started on Levaquin.   Discussed patient with Dr. Posey Pronto who accepts patient for admission.   Portions of this note were generated with [employer]. Dictation errors may occur despite best attempts at proofreading.   Final Clinical Impression(s) / ED Diagnoses Final diagnoses:  AKI (acute kidney injury) ([place])  Hyperglycemia    Rx / DC Orders ED Discharge Orders    None       Desma Mcgregor 04/06/20 0019    Ezequiel Essex, MD 04/07/20 1014

## 2020-04-06 DIAGNOSIS — N1831 Chronic kidney disease, stage 3a: Secondary | ICD-10-CM

## 2020-04-06 DIAGNOSIS — N179 Acute kidney failure, unspecified: Secondary | ICD-10-CM | POA: Diagnosis present

## 2020-04-06 DIAGNOSIS — R93811 Abnormal radiologic findings on diagnostic imaging of right testicle: Secondary | ICD-10-CM

## 2020-04-06 DIAGNOSIS — E1122 Type 2 diabetes mellitus with diabetic chronic kidney disease: Secondary | ICD-10-CM | POA: Diagnosis not present

## 2020-04-06 DIAGNOSIS — N50812 Left testicular pain: Secondary | ICD-10-CM

## 2020-04-06 LAB — HEMOGLOBIN A1C
Hgb A1c MFr Bld: 14.2 % — ABNORMAL HIGH (ref 4.8–5.6)
Mean Plasma Glucose: 360.84 mg/dL

## 2020-04-06 LAB — URINE CULTURE

## 2020-04-06 LAB — BASIC METABOLIC PANEL
Anion gap: 11 (ref 5–15)
BUN: 22 mg/dL — ABNORMAL HIGH (ref 6–20)
CO2: 23 mmol/L (ref 22–32)
Calcium: 8.6 mg/dL — ABNORMAL LOW (ref 8.9–10.3)
Chloride: 100 mmol/L (ref 98–111)
Creatinine, Ser: 2.17 mg/dL — ABNORMAL HIGH (ref 0.61–1.24)
GFR calc Af Amer: 38 mL/min — ABNORMAL LOW (ref >60)
GFR calc non Af Amer: 33 mL/min — ABNORMAL LOW (ref >60)
Glucose, Bld: 410 mg/dL — ABNORMAL HIGH (ref 70–99)
Potassium: 3.4 mmol/L — ABNORMAL LOW (ref 3.5–5.1)
Sodium: 134 mmol/L — ABNORMAL LOW (ref 135–145)

## 2020-04-06 LAB — CBC
HCT: 33.3 % — ABNORMAL LOW (ref 39.0–52.0)
Hemoglobin: 11.1 g/dL — ABNORMAL LOW (ref 13.0–17.0)
MCH: 29.7 pg (ref 26.0–34.0)
MCHC: 33.3 g/dL (ref 30.0–36.0)
MCV: 89 fL (ref 80.0–100.0)
Platelets: 190 10*3/uL (ref 150–400)
RBC: 3.74 MIL/uL — ABNORMAL LOW (ref 4.22–5.81)
RDW: 11.5 % (ref 11.5–15.5)
WBC: 5.7 10*3/uL (ref 4.0–10.5)
nRBC: 0 % (ref 0.0–0.2)

## 2020-04-06 LAB — GLUCOSE, CAPILLARY
Glucose-Capillary: 355 mg/dL — ABNORMAL HIGH (ref 70–99)
Glucose-Capillary: 315 mg/dL — ABNORMAL HIGH (ref 70–99)
Glucose-Capillary: 224 mg/dL — ABNORMAL HIGH (ref 70–99)
Glucose-Capillary: 70 mg/dL (ref 70–99)
Glucose-Capillary: 144 mg/dL — ABNORMAL HIGH (ref 70–99)

## 2020-04-06 LAB — TROPONIN I (HIGH SENSITIVITY)
Troponin I (High Sensitivity): 12 ng/L (ref <18)
Troponin I (High Sensitivity): 12 ng/L (ref <18)

## 2020-04-06 LAB — RESPIRATORY PANEL BY RT PCR (FLU A&B, COVID)
Influenza A by PCR: NEGATIVE
Influenza B by PCR: NEGATIVE
SARS Coronavirus 2 by RT PCR: NEGATIVE

## 2020-04-06 LAB — CREATININE, URINE, RANDOM: Creatinine, Urine: 327.65 mg/dL

## 2020-04-06 LAB — SODIUM, URINE, RANDOM: Sodium, Ur: <10 mmol/L

## 2020-04-06 LAB — HIV ANTIBODY (ROUTINE TESTING W REFLEX): HIV Screen 4th Generation wRfx: NONREACTIVE

## 2020-04-06 MED ORDER — INSULIN ASPART 100 UNIT/ML ~~LOC~~ SOLN
15.0000 [IU] | Freq: Three times a day (TID) | SUBCUTANEOUS | Status: DC
  Administered 2020-04-06 – 2020-04-07 (×4): 15 [IU] via SUBCUTANEOUS

## 2020-04-06 MED ORDER — ONDANSETRON HCL 4 MG PO TABS
4.0000 mg | ORAL_TABLET | Freq: Four times a day (QID) | ORAL | Status: DC | PRN
  Administered 2020-04-06: 4 mg via ORAL
  Filled 2020-04-06: qty 1

## 2020-04-06 MED ORDER — ASPIRIN EC 81 MG PO TBEC
81.0000 mg | DELAYED_RELEASE_TABLET | Freq: Every day | ORAL | Status: DC
  Administered 2020-04-06 – 2020-04-07 (×2): 81 mg via ORAL
  Filled 2020-04-06 (×2): qty 1

## 2020-04-06 MED ORDER — LEVETIRACETAM 500 MG PO TABS
500.0000 mg | ORAL_TABLET | Freq: Every day | ORAL | Status: DC
  Administered 2020-04-06 – 2020-04-07 (×2): 500 mg via ORAL
  Filled 2020-04-06 (×2): qty 1

## 2020-04-06 MED ORDER — AMLODIPINE BESYLATE 5 MG PO TABS
5.0000 mg | ORAL_TABLET | Freq: Every day | ORAL | Status: DC
  Administered 2020-04-06 – 2020-04-07 (×2): 5 mg via ORAL
  Filled 2020-04-06 (×2): qty 1

## 2020-04-06 MED ORDER — ACETAMINOPHEN 325 MG PO TABS
650.0000 mg | ORAL_TABLET | Freq: Four times a day (QID) | ORAL | Status: DC | PRN
  Administered 2020-04-06: 650 mg via ORAL
  Filled 2020-04-06: qty 2

## 2020-04-06 MED ORDER — EZETIMIBE 10 MG PO TABS
10.0000 mg | ORAL_TABLET | Freq: Every day | ORAL | Status: DC
  Administered 2020-04-06 – 2020-04-07 (×2): 10 mg via ORAL
  Filled 2020-04-06 (×2): qty 1

## 2020-04-06 MED ORDER — SENNOSIDES-DOCUSATE SODIUM 8.6-50 MG PO TABS
1.0000 | ORAL_TABLET | Freq: Every evening | ORAL | Status: DC | PRN
  Administered 2020-04-07: 1 via ORAL
  Filled 2020-04-06: qty 1

## 2020-04-06 MED ORDER — POTASSIUM CHLORIDE CRYS ER 20 MEQ PO TBCR
20.0000 meq | EXTENDED_RELEASE_TABLET | Freq: Once | ORAL | Status: AC
  Administered 2020-04-06: 20 meq via ORAL
  Filled 2020-04-06: qty 1

## 2020-04-06 MED ORDER — PANTOPRAZOLE SODIUM 40 MG PO TBEC
80.0000 mg | DELAYED_RELEASE_TABLET | Freq: Every day | ORAL | Status: DC
  Administered 2020-04-06 – 2020-04-07 (×2): 80 mg via ORAL
  Filled 2020-04-06 (×2): qty 2

## 2020-04-06 MED ORDER — HYDROMORPHONE HCL 1 MG/ML IJ SOLN
0.5000 mg | INTRAMUSCULAR | Status: DC | PRN
  Administered 2020-04-06 – 2020-04-07 (×3): 0.5 mg via INTRAVENOUS
  Filled 2020-04-06 (×2): qty 0.5

## 2020-04-06 MED ORDER — ONDANSETRON HCL 4 MG/2ML IJ SOLN: 4.0000 mg | Freq: Four times a day (QID) | INTRAMUSCULAR | Status: DC | PRN

## 2020-04-06 MED ORDER — LEVOFLOXACIN 500 MG PO TABS
500.0000 mg | ORAL_TABLET | Freq: Every day | ORAL | Status: DC
  Administered 2020-04-06: 500 mg via ORAL
  Filled 2020-04-06: qty 1

## 2020-04-06 MED ORDER — SODIUM CHLORIDE 0.9 % IV SOLN: INTRAVENOUS | Status: AC

## 2020-04-06 MED ORDER — ATORVASTATIN CALCIUM 40 MG PO TABS
40.0000 mg | ORAL_TABLET | Freq: Every day | ORAL | Status: DC
  Administered 2020-04-06 – 2020-04-07 (×3): 40 mg via ORAL
  Filled 2020-04-06 (×3): qty 1

## 2020-04-06 MED ORDER — INSULIN GLARGINE 100 UNIT/ML ~~LOC~~ SOLN
60.0000 [IU] | Freq: Every day | SUBCUTANEOUS | Status: DC
  Administered 2020-04-06 – 2020-04-07 (×2): 60 [IU] via SUBCUTANEOUS
  Filled 2020-04-06 (×2): qty 0.6

## 2020-04-06 MED ORDER — DIPHENHYDRAMINE HCL 50 MG/ML IJ SOLN: 25.0000 mg | INTRAMUSCULAR | Status: DC | PRN

## 2020-04-06 MED ORDER — SERTRALINE HCL 50 MG PO TABS
100.0000 mg | ORAL_TABLET | Freq: Every day | ORAL | Status: DC
  Administered 2020-04-06 – 2020-04-07 (×2): 100 mg via ORAL
  Filled 2020-04-06 (×2): qty 2

## 2020-04-06 MED ORDER — INSULIN ASPART 100 UNIT/ML ~~LOC~~ SOLN
0.0000 [IU] | Freq: Every day | SUBCUTANEOUS | Status: DC
  Administered 2020-04-06: 5 [IU] via SUBCUTANEOUS

## 2020-04-06 MED ORDER — ACETAMINOPHEN 650 MG RE SUPP: 650.0000 mg | Freq: Four times a day (QID) | RECTAL | Status: DC | PRN

## 2020-04-06 MED ORDER — QUETIAPINE FUMARATE 25 MG PO TABS
25.0000 mg | ORAL_TABLET | Freq: Every day | ORAL | Status: DC
  Administered 2020-04-06 – 2020-04-07 (×2): 25 mg via ORAL
  Filled 2020-04-06 (×2): qty 1

## 2020-04-06 MED ORDER — INSULIN ASPART 100 UNIT/ML ~~LOC~~ SOLN
0.0000 [IU] | Freq: Three times a day (TID) | SUBCUTANEOUS | Status: DC
Start: 2020-04-06 — End: 2020-04-06

## 2020-04-06 NOTE — H&P (Addendum)
History and Physical    Brandon [name] LKG:[id number] DOB: 1961/02/09 DOA: 04/05/2020  PCP: Carlena Hurl, PA-C  Patient coming from: Home  I have personally briefly reviewed patient's old medical records in [place]  Chief Complaint: LLQ abdominal pain and left testicular pain  HPI: Brandon [name] is a 59 y.o. male with medical history significant for insulin-dependent type 2 diabetes, history of CVA with residual left-sided weakness, seizure disorder, CKD stage IIIa, renal cell carcinoma s/p bilateral cryoablation, hypertension, hyperlipidemia, and mood disorder who presents to the ED for evaluation of abdominal and testicular pain.  Patient states he has been having 5 days of worsening epigastric discomfort.  He feels as if this is worsening heartburn/reflux.  Symptoms are worse after eating meals.  He was started on Prilosec 40 mg daily by his primary care doctor on 03/30/2020.  He has been having continued symptoms and over the last 3 days has had frequent nausea with nonbilious nonbloody vomiting.  He has had decreased oral intake.  He reports decreased urine output and straining on urination without burning or pain when he urinates.  He has been having chills and diaphoresis but no subjective fevers.  He denies any diarrhea.  He denies any chest pain or dyspnea.  Over the last 2 days he has been having left-sided abdominal pain radiating down to the left lower quadrant and today has begun having left testicular pain.  Pain became so severe he presented to the ED for further evaluation.  He says he is not sexually active and has not had intercourse and at least a couple years.  He has not seen any urethral discharge.  He reports occasional alcohol use.  He denies any NSAID use.   ED Course:  Initial vitals showed BP 135/92, pulse 95, RR 20, temp 98.9 Fahrenheit, SPO2 100% on room air.  Labs are notable for WBC 5.6, hemoglobin 11.6, platelets 212,000, sodium 133, potassium  3.8, bicarb 27, BUN 24, creatinine 2.48, serum glucose 499, anion gap 9, LFTs within normal limits.  Lipase 34.  Urinalysis shows >500 glucose, >300 protein, negative nitrites, negative leukocytes, 0-5 RBC/hpf, 0-5 WBC/hpf, no bacteria microscopy.  SARS-CoV-2 PCR panel was collected and pending.  Scrotal ultrasound with Doppler showed heterogeneous right testicle which may represent underlying inflammation or infiltrative mass.  Right epididymis was normal in size and appearance.  Left testicle and left epididymis were unremarkable.  Small, mildly complex right hydrocele noted.  CT renal stone study was negative for acute intra-abdominal or pelvic pathology.  Sigmoid diverticulosis is noted.  No bowel obstruction seen.  Normal-appearing appendix.  No significant interval change in the cryoablation areas in bilateral kidneys.  Patient was given 1 L normal saline, fentanyl for pain, 4 units NovoLog, Zofran x2, and IV Levaquin.  The hospitalist service was consulted to admit for further evaluation and management.   Review of Systems: All systems reviewed and are negative except as documented in history of present illness above.   Past Medical History:  Diagnosis Date  . Acute ischemic stroke ([place]) 11/2013  . Allergy   . Anxiety   . Arthritis   . Benign hypertension with CKD (chronic kidney disease) stage III   . 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 CVA (cerebral vascular accident) ([place])    stroke  . Depression    BH Adm in [place] Depression  . Diabetes mellitus    DKA prior hospitalization  .  Diverticulitis    s/p micorperforation Sept 2012-managed conservatively by Gen surgery  . ED (erectile dysfunction)   . Focal seizure ([place]) 11/2013   due to ischemic stroke  . GERD (gastroesophageal reflux disease)   . Hiatal hernia   . Hyperlipidemia   . Hypertension   . Kidney tumor 09/2011   Renal cell CA  . Seizures ([place])    none since 2016,  taking Keppra - maw  . Sleep apnea   . Thyroid disease    "weak thyroid" per MD  . Wears glasses     Past Surgical History:  Procedure Laterality Date  . COLONOSCOPY    . IR RADIOLOGIST EVAL & MGMT  04/04/2017  . KIDNEY SURGERY     ablation of renal cell CA - 12/28, prior one was October 2012-Dr. Kathlene Cote  . TEE WITHOUT CARDIOVERSION N/A 11/19/2013   Procedure: TRANSESOPHAGEAL ECHOCARDIOGRAM (TEE);  Surgeon: Lelon Perla, MD;  Location: [facility] ENDOSCOPY;  Service: Cardiovascular;  Laterality: N/A;    Social History:  reports that he has never smoked. He has never used smokeless tobacco. He reports previous alcohol use. He reports that he does not use drugs.  Allergies  Allergen Reactions  . Morphine Itching    Family History  Problem Relation Age of Onset  . Diabetes Father   . Heart disease Father   . Pneumonia Mother   . Prostate cancer Other   . Stomach cancer Other   . Breast cancer Sister   . Colon cancer Neg Hx   . Esophageal cancer Neg Hx   . Rectal cancer Neg Hx      Prior to Admission medications   Medication Sig Start Date End Date Taking? Authorizing Provider  amLODipine (NORVASC) 5 MG tablet Take 5 mg by mouth daily. 01/17/20  Yes [provider]  aspirin EC 81 MG tablet Take 1 tablet (81 mg total) by mouth daily. 01/09/20  Yes Tysinger, Camelia Eng, PA-C  atorvastatin (LIPITOR) 40 MG tablet Take 1 tablet (40 mg total) by mouth daily. 07/05/19  Yes Tysinger, Camelia Eng, PA-C  ezetimibe (ZETIA) 10 MG tablet Take 1 tablet (10 mg total) by mouth daily. 01/27/20 04/26/20 Yes Tolia, Sunit, DO  hydrochlorothiazide (MICROZIDE) 12.5 MG capsule Take 1 capsule (12.5 mg total) by mouth every morning. 01/27/20 04/26/20 Yes Tolia, Sunit, DO  insulin aspart (NOVOLOG FLEXPEN) 100 UNIT/ML FlexPen Inject 15 Units into the skin 3 (three) times daily with meals. 02/06/20  Yes Shamleffer, Melanie Crazier, MD  insulin glargine (LANTUS SOLOSTAR) 100 UNIT/ML Solostar Pen Inject 60 Units  into the skin daily. Patient taking differently: Inject 45-60 Units into the skin daily.  02/06/20  Yes Shamleffer, Melanie Crazier, MD  levETIRAcetam (KEPPRA) 500 MG tablet Take 2 tablets (1,000 mg total) by mouth daily. Patient taking differently: Take 500 mg by mouth daily.  03/05/18  Yes Dennie Bible, NP  omeprazole (PRILOSEC) 40 MG capsule Take 1 capsule (40 mg total) by mouth daily. 03/30/20  Yes Tysinger, Camelia Eng, PA-C  ondansetron (ZOFRAN) 4 MG tablet Take 1 tablet (4 mg total) by mouth daily as needed for nausea or vomiting. 01/09/20 01/08/21 Yes Tysinger, Camelia Eng, PA-C  QUEtiapine (SEROQUEL) 25 MG tablet Take 1 tablet (25 mg total) by mouth daily. 07/05/19  Yes Tysinger, Camelia Eng, PA-C  Semaglutide, 1 MG/DOSE, (OZEMPIC, 1 MG/DOSE,) 2 MG/1.5ML SOPN Inject 2 mg into the skin once a week. 01/09/20  Yes Tysinger, Camelia Eng, PA-C  sertraline (ZOLOFT) 100 MG tablet Take 1  tablet (100 mg total) by mouth daily. 07/05/19  Yes Tysinger, Camelia Eng, PA-C  blood glucose meter kit and supplies Dispense based on patient and insurance preference. Use up to four times daily as directed. (FOR ICD-9 250.00, 250.01). 04/24/16   Eugenie Filler, MD  glucose blood [facility] ACTIVE STRIPS) test strip Use as instructed 06/11/19   Tysinger, Camelia Eng, PA-C  glucose blood test strip accu chek active 1-2 times daily 01/17/20   Tysinger, Camelia Eng, PA-C  Insulin Pen Needle (PEN NEEDLES) 32G X 4 MM MISC 1 each by Does not apply route daily. Use for insulin pens 07/05/19   Tysinger, Camelia Eng, PA-C  Lancets (ACCU-CHEK SOFT TOUCH) lancets Use as instructed 06/11/19   Carlena Hurl, PA-C    Physical Exam: Vitals:   04/05/20 2112 04/05/20 2118 04/05/20 2300 04/05/20 2330  BP: (!) 135/92  (!) 147/94 107/90  Pulse:   83 84  Resp: '20  16 15  ' Temp: 98.9 F (37.2 C)     TempSrc: Oral     SpO2: 100%  96% 99%  Weight:  104.3 kg    Height:  6' 0.5" (1.842 m)     Constitutional: Obese man resting in bed with head elevated, NAD,  calm, comfortable Eyes: PERRL, lids and conjunctivae normal ENMT: Mucous membranes are moist. Posterior pharynx clear of any exudate or lesions. Neck: normal, supple, no masses. Respiratory: clear to auscultation bilaterally, no wheezing, no crackles. Normal respiratory effort. No accessory muscle use.  Cardiovascular: Regular rate and rhythm, no murmurs / rubs / gallops. No extremity edema. 2+ pedal pulses. Abdomen: Epigastric and LLQ tenderness, no masses palpated. No hepatosplenomegaly. Bowel sounds positive.  Musculoskeletal: no clubbing / cyanosis. No joint deformity upper and lower extremities. ROM slightly diminished LLE otherwise intact, no contractures. Normal muscle tone.  Skin: no rashes, lesions, ulcers. No induration Neurologic: CN 2-12 grossly intact.  Frequent stuttering of speech but otherwise clear.  Sensation intact, Strength mildly decreased LLE compared to RLE but otherwise 5/5 in all 4.  Psychiatric: Alert and oriented x 3. Normal mood.   Labs on Admission: I have personally reviewed following labs and imaging studies  CBC: Recent Labs  Lab 04/05/20 2119  WBC 5.6  HGB 11.6*  HCT 34.1*  MCV 88.6  PLT 370   Basic Metabolic Panel: Recent Labs  Lab 04/05/20 2119  NA 133*  K 3.8  CL 97*  CO2 27  GLUCOSE 499*  BUN 24*  CREATININE 2.48*  CALCIUM 8.9   GFR: Estimated Creatinine Clearance: 41.9 mL/min (A) (by C-G formula based on SCr of 2.48 mg/dL (H)). Liver Function Tests: Recent Labs  Lab 04/05/20 2119  AST 22  ALT 20  ALKPHOS 122  BILITOT 1.2  PROT 7.6  ALBUMIN 4.2   Recent Labs  Lab 04/05/20 2119  LIPASE 34   No results for input(s): AMMONIA in the last 168 hours. Coagulation Profile: No results for input(s): INR, PROTIME in the last 168 hours. Cardiac Enzymes: No results for input(s): CKTOTAL, CKMB, CKMBINDEX, TROPONINI in the last 168 hours. BNP (last 3 results) No results for input(s): PROBNP in the last 8760 hours. HbA1C: No results  for input(s): HGBA1C in the last 72 hours. CBG: No results for input(s): GLUCAP in the last 168 hours. Lipid Profile: No results for input(s): CHOL, HDL, LDLCALC, TRIG, CHOLHDL, LDLDIRECT in the last 72 hours. Thyroid Function Tests: No results for input(s): TSH, T4TOTAL, FREET4, T3FREE, THYROIDAB in the last 72 hours. Anemia Panel:  No results for input(s): VITAMINB12, FOLATE, FERRITIN, TIBC, IRON, RETICCTPCT in the last 72 hours. Urine analysis:    Component Value Date/Time   COLORURINE YELLOW 04/05/2020 2119   APPEARANCEUR CLEAR 04/05/2020 2119   LABSPEC 1.024 04/05/2020 2119   LABSPEC 1.030 05/23/2017 1538   PHURINE 5.0 04/05/2020 2119   GLUCOSEU >=500 (A) 04/05/2020 2119   HGBUR NEGATIVE 04/05/2020 2119   BILIRUBINUR NEGATIVE 04/05/2020 2119   BILIRUBINUR negative 05/23/2017 1538   BILIRUBINUR neg 11/04/2016 1619   KETONESUR NEGATIVE 04/05/2020 2119   PROTEINUR >=300 (A) 04/05/2020 2119   UROBILINOGEN negative 11/04/2016 1619   UROBILINOGEN 1.0 12/13/2014 1029   NITRITE NEGATIVE 04/05/2020 2119   LEUKOCYTESUR NEGATIVE 04/05/2020 2119    Radiological Exams on Admission: CT Renal Stone Study  Result Date: 04/05/2020 CLINICAL DATA:  59 year old male with abdominal pain. EXAM: CT ABDOMEN AND PELVIS WITHOUT CONTRAST TECHNIQUE: Multidetector CT imaging of the abdomen and pelvis was performed following the standard protocol without IV contrast. COMPARISON:  Abdominal ultrasound dated 02/19/2020 and CT dated 02/05/2018. FINDINGS: Evaluation of this exam is limited in the absence of intravenous contrast. Lower chest: The visualized lung bases are clear. Coronary vascular calcifications noted. No intra-abdominal free air or free fluid. Hepatobiliary: The liver is unremarkable. No intrahepatic biliary ductal dilatation. Probable minimal layering stone within the gallbladder. No pericholecystic fluid or evidence of acute cholecystitis by CT. Pancreas: Unremarkable. No pancreatic ductal  dilatation or surrounding inflammatory changes. Spleen: Normal in size without focal abnormality. Adrenals/Urinary Tract: The adrenal glands are unremarkable. No significant interval change in areas of cryoablation in the kidneys bilaterally. No hydronephrosis or nephrolithiasis. The visualized ureters and urinary bladder appear unremarkable. Stomach/Bowel: There is sigmoid diverticulosis without active inflammatory changes. There is no bowel obstruction or active inflammation. The appendix is normal. Vascular/Lymphatic: Mild aortoiliac atherosclerotic disease. The IVC is unremarkable. No portal venous gas. There is no adenopathy. Reproductive: The prostate and seminal vesicles are unremarkable. No pelvic mass. Other: None Musculoskeletal: Degenerative changes of the spine. No acute osseous pathology. IMPRESSION: 1. No acute intra-abdominal or pelvic pathology. 2. Sigmoid diverticulosis. No bowel obstruction. Normal appendix. 3. Aortic Atherosclerosis (ICD10-I70.0). 4. No significant interval change in the cryoablation areas in the bilateral kidneys. Electronically Signed   By: Anner Crete M.D.   On: 04/05/2020 23:05   US SCROTUM W/DOPPLER  Result Date: 04/05/2020 CLINICAL DATA:  59 year old male with left testicular pain. EXAM: SCROTAL ULTRASOUND DOPPLER ULTRASOUND OF THE TESTICLES TECHNIQUE: Complete ultrasound examination of the testicles, epididymis, and other scrotal structures was performed. Color and spectral Doppler ultrasound were also utilized to evaluate blood flow to the testicles. COMPARISON:  Testicular ultrasound dated 07/25/2016. FINDINGS: Right testicle Measurements: 4.0 x 2.0 x 2.4 cm. The right testicle is heterogeneous. There is a geographic area of decreased echogenicity within the right testicle. This may represent an area of edema related to underlying inflammation, although an infiltrative mass is not excluded. Correlation with clinical exam, and urinalysis and close follow-up with  ultrasound after treatment and resolution of acute symptoms recommended. If findings persist on follow-up ultrasound, further evaluation with MRI is warranted. Left testicle Measurements: 4.3 x 1.7 x 2.7 cm. No mass or microlithiasis visualized. Right epididymis:  Normal in size and appearance. Left epididymis:  Normal in size and appearance. Hydrocele:  There is a small, mildly complex right hydrocele. Varicocele:  None visualized. Pulsed Doppler interrogation of both testes demonstrates normal low resistance arterial and venous waveforms bilaterally. IMPRESSION: 1. Heterogeneous right testicle as described above  may be related to an inflammatory/infectious etiology. Clinical correlation and follow-up recommended. There is a small minimally complex right hydrocele. 2. Unremarkable left testicle. Electronically Signed   By: Anner Crete M.D.   On: 04/05/2020 22:36    EKG: Pending.  Assessment/Plan Principal Problem:   Acute renal failure superimposed on stage 3a chronic kidney disease (HCC) Active Problems:   Depression, recurrent ([place])   Dyslipidemia associated with type 2 diabetes mellitus ([place])   Hypertension associated with diabetes ([place])   History of stroke   Seizure disorder ([place])   Type 2 diabetes mellitus with stage 3a chronic kidney disease, with long-term current use of insulin ([place])   Abnormal radiologic findings on diagnostic imaging of right testicle  Brandon [name] is a 59 y.o. male with medical history significant for insulin-dependent type 2 diabetes, history of CVA, seizure disorder, CKD stage IIIa, renal cell carcinoma s/p bilateral cryoablation, hypertension, hyperlipidemia, and mood disorder who is admitted with AKI on CKD stage III.   AKI on CKD stage IIIa: Likely multifactorial from GI losses and HCTZ use.  He denies any NSAID use.  CT renal stone study is negative for hydronephrosis, nephrolithiasis, obstruction, or significant interval change in areas of  cryoablation in the bilateral kidneys. -Continue IV fluid hydration overnight -Hold HCTZ and avoid NSAIDs -Repeat labs in a.m.  Hyperglycemia in insulin-dependent type 2 diabetes: Without evidence of DKA. -Resume home Lantus 60 units daily and NovoLog 15 units with meals, add HS coverage  Left testicular pain with abnormal imaging of right testicle on ultrasound: Scrotal ultrasound is negative for evidence of torsion or epididymitis.  Left testicle is unremarkable on ultrasound but painful on physical exam.  Right testicle appears heterogeneous on ultrasound which may be related to underlying infection, inflammation, or possibly infiltrative mass.  Urinalysis is negative for UTI. -Continue empiric oral Levaquin 500 mg daily for total of 10-day course for possible orchitis -Obtain urine culture, check GC/chlamydia -He will need to follow-up with PCP for follow-up ultrasound after treatment and potential further evaluation with MRI if findings are persistent  Seizure disorder: -Continue Keppra  Hypertension: -Continue amlodipine, holding HCTZ in setting of AKI  History of CVA with residual left-sided weakness: -Continue aspirin 81 mg daily and atorvastatin  Hyperlipidemia: -Continue atorvastatin  GERD: Reporting worsening GERD symptoms with possible gastritis.  Continue Protonix 80 mg daily.  Mood disorder: Continue Seroquel and sertraline.  DVT prophylaxis: SCDs Code Status: Full code, confirmed with patient Family Communication: Discussed with significant other at bedside Disposition Plan: From home, likely discharge to home pending improvement in renal function Consults called: None Admission status:  Status is: Observation  The patient remains OBS appropriate and will d/c before 2 midnights.  Dispo: The patient is from: Home              Anticipated d/c is to: Home              Anticipated d/c date is: 1 day              Patient currently is not medically stable to  d/c.   Zada Finders MD [facility]  If 7PM-7AM, please contact night-coverage [email or web address]  04/06/2020, 1:00 AM

## 2020-04-06 NOTE — Progress Notes (Signed)
Brandon [name] is a 59 y.o. male with a history of insulin-dependent type 2 diabetes, CVA with residual left-sided weakness, seizure disorder, CKD 3 a, bilateral renal cell carcinoma in 2012 s/p bilateral cryoablation, hypertension, hyperlipidemia who was admitted early this morning by Dr. Posey Pronto for evaluation of abdominal pain left sided testicular pain and started on Levaquin for concern of orchitis.  CT renal stone study was negative for any acute issues and scrotal ultrasound showing heterogenous right testicle with minimally complex right hydrocele and unremarkable left testicle.  Currently, patient described having 1 to 2 months of worsening left flank pain which has been intermittent but worsening over the past week or so.  About 6 days ago patient had an episode of left testicular discomfort and several days later had several episodes of decreased p.o. intake with nausea and vomiting.  Reported that he had sudden onset left testicular pain described as having a string tied around his testicle yesterday while ambulating which prompted him to come to the ED.  Still does not feel that he can tolerate p.o. intake.   PE: General: Awake and alert, no acute distress  Heart: S1 and S2 auscultated, no murmurs  Lungs: Clear to auscultation bilaterally, no wheeze  Abd: Positive left CVA tenderness and left upper quadrant tenderness to palpation GU: Right testicle without any discomfort, left testicle significantly tender with light palpation without erythema or noted edema   A/P  1. Left flank and left testicular pain of unknown etiology with history of bilateral renal cell carcinoma status post cryoablation in 2012 a. CT and ultrasound unremarkable for left-sided findings but positive for right-sided hydrocele b. Significantly tender on exam c. Currently on Levaquin d. Urology consulted 2. AKI likely from decreased p.o. intake a. Improving but not at baseline b. Continue IV fluids 3. Seizure  disorder on Keppra 4. Hypertension on amlodipine and holding home HCTZ 5. Hyperlipidemia on statin 6. GERD on Protonix 7. Mood disorder on Seroquel and sertraline 8. History of CVA with residual left-sided weakness on aspirin and statin    [name], DO [facility] Pager [phone]

## 2020-04-06 NOTE — ED Notes (Signed)
ED TO INPATIENT HANDOFF REPORT  ED Nurse Name and Phone #: Fredonia Highland [phone]  S Name/Age/Gender Brandon [name] 59 y.o. male Room/Bed: WA23/WA23  Code Status   Code Status: Full Code  Home/SNF/Other Home Patient oriented to: self, place, time and situation Is this baseline? Yes   Triage Complete: Triage complete  Chief Complaint Acute renal failure superimposed on stage 3a chronic kidney disease, unspecified acute renal failure type ([place]) [N17.9, N18.31]  Triage Note Patient is complaining of left lower abdominal pain, testicle pain, and left flank pain. Patient last BM was two days ago. Patient is not having any trouble urinating. Patient state all this started three days ago.    Allergies Allergies  Allergen Reactions  . Morphine Itching    Level of Care/Admitting Diagnosis ED Disposition    ED Disposition Condition [facility] Area: [place] [[id number]]  Level of Care: Med-Surg [16]  Covid Evaluation: Asymptomatic Screening Protocol (No Symptoms)  Diagnosis: Acute renal failure superimposed on stage 3a chronic kidney disease, unspecified acute renal failure type [facility]) [[id number]]  Admitting Physician: Lenore Cordia [[id number]]  Attending Physician: Lenore Cordia [[id number]]       B Medical/Surgery History Past Medical History:  Diagnosis Date  . Acute ischemic stroke ([place]) 11/2013  . Allergy   . Anxiety   . Arthritis   . Benign hypertension with CKD (chronic kidney disease) stage III   . 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 CVA (cerebral vascular accident) ([place])    stroke  . Depression    BH Adm in [place] Depression  . Diabetes mellitus    DKA prior hospitalization  . Diverticulitis    s/p micorperforation Sept 2012-managed conservatively by Gen surgery  . ED (erectile dysfunction)   . Focal seizure ([place]) 11/2013   due to ischemic stroke  . GERD (gastroesophageal reflux  disease)   . Hiatal hernia   . Hyperlipidemia   . Hypertension   . Kidney tumor 09/2011   Renal cell CA  . Seizures ([place])    none since 2016, taking Keppra - maw  . Sleep apnea   . Thyroid disease    "weak thyroid" per MD  . Wears glasses    Past Surgical History:  Procedure Laterality Date  . COLONOSCOPY    . IR RADIOLOGIST EVAL & MGMT  04/04/2017  . KIDNEY SURGERY     ablation of renal cell CA - 12/28, prior one was October 2012-Dr. Kathlene Cote  . TEE WITHOUT CARDIOVERSION N/A 11/19/2013   Procedure: TRANSESOPHAGEAL ECHOCARDIOGRAM (TEE);  Surgeon: Lelon Perla, MD;  Location: [facility] ENDOSCOPY;  Service: Cardiovascular;  Laterality: N/A;     A IV Location/Drains/Wounds Patient Lines/Drains/Airways Status   Active Line/Drains/Airways    Name:   Placement date:   Placement time:   Site:   Days:   Peripheral IV 04/05/20 Left Antecubital   04/05/20    2133    Antecubital   1   Wound / Incision (Open or Dehisced) 04/22/16 Diabetic ulcer Foot Lower;Left bottom of L foot   04/22/16    0145    Foot   1445          Intake/Output Last 24 hours No intake or output data in the 24 hours ending 04/06/20 0137  Labs/Imaging Results for orders placed or performed during the hospital encounter of 04/05/20 (from the past 48 hour(s))  Lipase, blood  Status: None   Collection Time: 04/05/20  9:19 PM  Result Value Ref Range   Lipase 34 11 - 51 U/L    Comment: Performed at [facility], [place] [address] [place], [place] [address]  Comprehensive metabolic panel     Status: Abnormal   Collection Time: 04/05/20  9:19 PM  Result Value Ref Range   Sodium 133 (L) 135 - 145 mmol/L   Potassium 3.8 3.5 - 5.1 mmol/L   Chloride 97 (L) 98 - 111 mmol/L   CO2 27 22 - 32 mmol/L   Glucose, Bld 499 (H) 70 - 99 mg/dL    Comment: Glucose reference range applies only to samples taken after fasting for at least 8 hours.   BUN 24 (H) 6 - 20 mg/dL   Creatinine, Ser 2.48 (H) 0.61 - 1.24  mg/dL   Calcium 8.9 8.9 - 10.3 mg/dL   Total Protein 7.6 6.5 - 8.1 g/dL   Albumin 4.2 3.5 - 5.0 g/dL   AST 22 15 - 41 U/L   ALT 20 0 - 44 U/L   Alkaline Phosphatase 122 38 - 126 U/L   Total Bilirubin 1.2 0.3 - 1.2 mg/dL   GFR calc non Af Amer 28 (L) >60 mL/min   GFR calc Af Amer 32 (L) >60 mL/min   Anion gap 9 5 - 15    Comment: Performed at [facility], [place] [address], [place], [place] [address]  CBC     Status: Abnormal   Collection Time: 04/05/20  9:19 PM  Result Value Ref Range   WBC 5.6 4.0 - 10.5 K/uL   RBC 3.85 (L) 4.22 - 5.81 MIL/uL   Hemoglobin 11.6 (L) 13.0 - 17.0 g/dL   HCT 34.1 (L) 39.0 - 52.0 %   MCV 88.6 80.0 - 100.0 fL   MCH 30.1 26.0 - 34.0 pg   MCHC 34.0 30.0 - 36.0 g/dL   RDW 11.5 11.5 - 15.5 %   Platelets 212 150 - 400 K/uL   nRBC 0.0 0.0 - 0.2 %    Comment: Performed at [facility], [place] [address] [place], [place] [address]  Urinalysis, Routine w reflex microscopic     Status: Abnormal   Collection Time: 04/05/20  9:19 PM  Result Value Ref Range   Color, Urine YELLOW YELLOW   APPearance CLEAR CLEAR   Specific Gravity, Urine 1.024 1.005 - 1.030   pH 5.0 5.0 - 8.0   Glucose, UA >=500 (A) NEGATIVE mg/dL   Hgb urine dipstick NEGATIVE NEGATIVE   Bilirubin Urine NEGATIVE NEGATIVE   Ketones, ur NEGATIVE NEGATIVE mg/dL   Protein, ur >=300 (A) NEGATIVE mg/dL   Nitrite NEGATIVE NEGATIVE   Leukocytes,Ua NEGATIVE NEGATIVE   RBC / HPF 0-5 0 - 5 RBC/hpf   WBC, UA 0-5 0 - 5 WBC/hpf   Bacteria, UA NONE SEEN NONE SEEN   Hyaline Casts, UA PRESENT     Comment: Performed at [facility], [place] [address] [place], [place] [address]   CT Renal Stone Study  Result Date: 04/05/2020 CLINICAL DATA:  59 year old male with abdominal pain. EXAM: CT ABDOMEN AND PELVIS WITHOUT CONTRAST TECHNIQUE: Multidetector CT imaging of the abdomen and pelvis was performed following the standard protocol without IV contrast.  COMPARISON:  Abdominal ultrasound dated 02/19/2020 and CT dated 02/05/2018. FINDINGS: Evaluation of this exam is limited in the absence of intravenous contrast. Lower chest: The visualized lung bases are clear. Coronary vascular calcifications noted.  No intra-abdominal free air or free fluid. Hepatobiliary: The liver is unremarkable. No intrahepatic biliary ductal dilatation. Probable minimal layering stone within the gallbladder. No pericholecystic fluid or evidence of acute cholecystitis by CT. Pancreas: Unremarkable. No pancreatic ductal dilatation or surrounding inflammatory changes. Spleen: Normal in size without focal abnormality. Adrenals/Urinary Tract: The adrenal glands are unremarkable. No significant interval change in areas of cryoablation in the kidneys bilaterally. No hydronephrosis or nephrolithiasis. The visualized ureters and urinary bladder appear unremarkable. Stomach/Bowel: There is sigmoid diverticulosis without active inflammatory changes. There is no bowel obstruction or active inflammation. The appendix is normal. Vascular/Lymphatic: Mild aortoiliac atherosclerotic disease. The IVC is unremarkable. No portal venous gas. There is no adenopathy. Reproductive: The prostate and seminal vesicles are unremarkable. No pelvic mass. Other: None Musculoskeletal: Degenerative changes of the spine. No acute osseous pathology. IMPRESSION: 1. No acute intra-abdominal or pelvic pathology. 2. Sigmoid diverticulosis. No bowel obstruction. Normal appendix. 3. Aortic Atherosclerosis (ICD10-I70.0). 4. No significant interval change in the cryoablation areas in the bilateral kidneys. Electronically Signed   By: Anner Crete M.D.   On: 04/05/2020 23:05   US SCROTUM W/DOPPLER  Result Date: 04/05/2020 CLINICAL DATA:  60 year old male with left testicular pain. EXAM: SCROTAL ULTRASOUND DOPPLER ULTRASOUND OF THE TESTICLES TECHNIQUE: Complete ultrasound examination of the testicles, epididymis, and other scrotal  structures was performed. Color and spectral Doppler ultrasound were also utilized to evaluate blood flow to the testicles. COMPARISON:  Testicular ultrasound dated 07/25/2016. FINDINGS: Right testicle Measurements: 4.0 x 2.0 x 2.4 cm. The right testicle is heterogeneous. There is a geographic area of decreased echogenicity within the right testicle. This may represent an area of edema related to underlying inflammation, although an infiltrative mass is not excluded. Correlation with clinical exam, and urinalysis and close follow-up with ultrasound after treatment and resolution of acute symptoms recommended. If findings persist on follow-up ultrasound, further evaluation with MRI is warranted. Left testicle Measurements: 4.3 x 1.7 x 2.7 cm. No mass or microlithiasis visualized. Right epididymis:  Normal in size and appearance. Left epididymis:  Normal in size and appearance. Hydrocele:  There is a small, mildly complex right hydrocele. Varicocele:  None visualized. Pulsed Doppler interrogation of both testes demonstrates normal low resistance arterial and venous waveforms bilaterally. IMPRESSION: 1. Heterogeneous right testicle as described above may be related to an inflammatory/infectious etiology. Clinical correlation and follow-up recommended. There is a small minimally complex right hydrocele. 2. Unremarkable left testicle. Electronically Signed   By: Anner Crete M.D.   On: 04/05/2020 22:36    Pending Labs Unresulted Labs (From admission, onward)    Start     Ordered   04/06/20 0500  CBC  Tomorrow morning,   R     04/06/20 0056   04/06/20 0017  Basic metabolic panel  Tomorrow morning,   R     04/06/20 0056   04/06/20 0103  Sodium, urine, random  Add-on,   AD     04/06/20 0102   04/06/20 0103  Creatinine, urine, random  Add-on,   AD     04/06/20 0102   04/06/20 0059  Hemoglobin A1c  Add-on,   AD    Comments: To assess prior glycemic control    04/06/20 0059   04/06/20 0055  HIV Antibody  (routine testing w rflx)  (HIV Antibody (Routine testing w reflex) panel)  Add-on,   AD     04/06/20 0056   04/06/20 0055  Culture, Urine  Add-on,   AD  04/06/20 0056   04/05/20 2351  Respiratory Panel by RT PCR (Flu A&B, Covid) - Nasopharyngeal Swab  (Tier 2 Respiratory Panel by RT PCR (Flu A&B, Covid) (TAT 2 hrs))  Once,   STAT    Question Answer Comment  Is this test for diagnosis or screening Screening   Symptomatic for COVID-19 as defined by CDC No   Hospitalized for COVID-19 No   Admitted to ICU for COVID-19 No   Previously tested for COVID-19 No   Resident in a congregate (group) care setting No   Employed in healthcare setting No   Has patient completed COVID vaccination(s) (2 doses of Pfizer/Moderna 1 dose of [employer]) No      04/05/20 2350          Vitals/Pain Today's Vitals   04/05/20 2300 04/05/20 2330 04/05/20 2339 04/06/20 0130  BP: (!) 147/94 107/90  (!) 144/93  Pulse: 83 84  73  Resp: 16 15  14   Temp:      TempSrc:      SpO2: 96% 99%  100%  Weight:      Height:      PainSc:   5      Isolation Precautions No active isolations  Medications Medications  0.9 %  sodium chloride infusion (has no administration in time range)  acetaminophen (TYLENOL) tablet 650 mg (has no administration in time range)    Or  acetaminophen (TYLENOL) suppository 650 mg (has no administration in time range)  ondansetron (ZOFRAN) tablet 4 mg (has no administration in time range)    Or  ondansetron (ZOFRAN) injection 4 mg (has no administration in time range)  senna-docusate (Senokot-S) tablet 1 tablet (has no administration in time range)  levofloxacin (LEVAQUIN) tablet 500 mg (has no administration in time range)  amLODipine (NORVASC) tablet 5 mg (has no administration in time range)  aspirin EC tablet 81 mg (has no administration in time range)  atorvastatin (LIPITOR) tablet 40 mg (has no administration in time range)  ezetimibe (ZETIA) tablet 10 mg (has no  administration in time range)  levETIRAcetam (KEPPRA) tablet 500 mg (has no administration in time range)  pantoprazole (PROTONIX) EC tablet 80 mg (has no administration in time range)  QUEtiapine (SEROQUEL) tablet 25 mg (has no administration in time range)  sertraline (ZOLOFT) tablet 100 mg (has no administration in time range)  insulin glargine (LANTUS) injection 60 Units (has no administration in time range)  insulin aspart (novoLOG) injection 0-5 Units (has no administration in time range)  insulin aspart (novoLOG) injection 15 Units (has no administration in time range)  sodium chloride flush (NS) 0.9 % injection 3 mL (3 mLs Intravenous Given 04/05/20 2143)  fentaNYL (SUBLIMAZE) injection 50 mcg (50 mcg Intravenous Given 04/05/20 2206)  ondansetron (ZOFRAN) injection 4 mg (4 mg Intravenous Given 04/05/20 2206)  sodium chloride 0.9 % bolus 1,000 mL (1,000 mLs Intravenous New Bag/Given 04/05/20 2333)  insulin aspart (novoLOG) injection 4 Units (4 Units Intravenous Given 04/05/20 2342)  fentaNYL (SUBLIMAZE) injection 50 mcg (50 mcg Intravenous Given 04/05/20 2342)  ondansetron (ZOFRAN) injection 4 mg (4 mg Intravenous Given 04/05/20 2342)  levofloxacin (LEVAQUIN) IVPB 500 mg (500 mg Intravenous New Bag/Given 04/06/20 0003)    Mobility walks Low fall risk   Focused Assessments Gen Med   R Recommendations: See Admitting Provider Note  Report given to:   Additional Notes:

## 2020-04-06 NOTE — TOC Initial Note (Signed)
Transition of Care [facility]) - Initial/Assessment Note    Patient Details  Name: Brandon [name] MRN: [id number] Date of Birth: July 21, 1961  Transition of Care [facility]) CM/SW Contact:    Lennart Pall, LCSW Phone Number: 04/06/2020, 2:13 PM  Clinical Narrative:  Met with pt this morning to review living situation and potential d/c needs.  Pt confirms that he has lived with Barbette Or, who is the mother of their son, for a few years "because she was just helping me out...but she is going to move and I need to get a new place soon.  She doesn't do much for me anymore.  We aren't 'together'".  He reports he has submitted 2 applications with [employer] but has not yet been able to secure housing at this time. He will return to the home with [name] at time of d/c and continue to work on getting housing elsewhere.  He requests assistance with getting aide services in the home.  I will submit an application for PCS Aide through his Medicaid coverage while he is here, however, this could take a few weeks to start.  Have asked MD about possibility of PT consult to determine extent of his assistance needs as well.  Continue to follow.                  Expected Discharge Plan: Home/Self Care Barriers to Discharge: Continued Medical Work up   Patient Goals and CMS Choice Patient states their goals for this hospitalization and ongoing recovery are:: pt hopeful he might get some aide assistance in the home      Expected Discharge Plan and Services Expected Discharge Plan: Home/Self Care In-house Referral: Clinical Social Work Discharge Planning Services: Other - See comment(pt hopeful for aide if medicaid eligible)   Living arrangements for the past 2 months: Apartment                                      Prior Living Arrangements/Services Living arrangements for the past 2 months: Apartment Lives with:: Other (Comment)(lives with his son's mother, however, notes they are not in  a relationship - "she just helps me out") Patient language and need for interpreter reviewed:: Yes Do you feel safe going back to the place where you live?: Yes(if he can have some assistance)      Need for Family Participation in Patient Care: Yes (Comment) Care giver support system in place?: No (comment)(pt notes that [name] does not assist him much any longer)   Criminal Activity/Legal Involvement Pertinent to Current Situation/Hospitalization: No - Comment as needed  Activities of Daily Living Home Assistive Devices/Equipment: CBG Meter, Blood pressure cuff, Cane (specify quad or straight) ADL Screening (condition at time of admission) Patient's cognitive ability adequate to safely complete daily activities?: Yes Is the patient deaf or have difficulty hearing?: No Does the patient have difficulty seeing, even when wearing glasses/contacts?: No Does the patient have difficulty concentrating, remembering, or making decisions?: No Patient able to express need for assistance with ADLs?: Yes Does the patient have difficulty dressing or bathing?: No Independently performs ADLs?: Yes (appropriate for developmental age) Does the patient have difficulty walking or climbing stairs?: Yes Weakness of Legs: Left Weakness of Arms/Hands: None  Permission Sought/Granted Permission sought to share information with : Family Supports Permission granted to share information with : Yes, Verbal Permission Granted  Share Information with NAME:  Barbette Or     Permission granted to share info w Relationship: son's mother  Permission granted to share info w Contact Information: [phone]  Emotional Assessment Appearance:: Appears older than stated age Attitude/Demeanor/Rapport: Engaged, Gracious Affect (typically observed): Calm, Stable, Pleasant Orientation: : Oriented to Self, Oriented to Place, Oriented to  Time, Oriented to Situation Alcohol / Substance Use: Not Applicable Psych Involvement:  No (comment)  Admission diagnosis:  Hyperglycemia [R73.9] AKI (acute kidney injury) ([place]) [N17.9] Acute renal failure superimposed on stage 3a chronic kidney disease, unspecified acute renal failure type ([place]) [N17.9, N18.31] Patient Active Problem List   Diagnosis Date Noted  . Acute renal failure superimposed on stage 3a chronic kidney disease ([place]) 04/06/2020  . Abnormal radiologic findings on diagnostic imaging of right testicle 04/06/2020  . Type 2 diabetes mellitus with diabetic polyneuropathy, with long-term current use of insulin ([place]) 02/09/2020  . Type 2 diabetes mellitus with stage 3a chronic kidney disease, with long-term current use of insulin ([place]) 02/09/2020  . Family history of premature CAD 01/27/2020  . Stage 3b chronic kidney disease 01/09/2020  . Incontinence of feces 01/09/2020  . Chronic pain of left knee 01/09/2020  . Left leg pain 01/09/2020  . Fall 01/09/2020  . History of diverticulitis 10/30/2019  . Noncompliance 06/10/2019  . History of renal cell cancer 04/24/2018  . Erectile dysfunction 04/24/2018  . Edema 03/15/2018  . Seizure disorder ([place]) 03/01/2018  . History of stroke 11/29/2017  . Obstructive sleep apnea 11/29/2017  . Uncontrolled diabetes mellitus ([place]) 05/10/2017  . Dyslipidemia associated with type 2 diabetes mellitus ([place]) 05/10/2017  . Hypertension associated with diabetes ([place]) 05/10/2017  . History of cerebrovascular accident (CVA) with residual deficit 05/10/2017  . Diabetic ulcer of left foot associated with secondary diabetes mellitus ([place]) 04/21/2016  . History of seizure 01/05/2016  . Depression, recurrent ([place]) 09/21/2014  . Benign hypertension with CKD (chronic kidney disease) stage III 12/30/2011   PCP:  Carlena Hurl, PA-C Pharmacy:   [id number] [place] [place] [address]Phone: [phone]: [phone]    Social Determinants of Health (SDOH)  Interventions    Readmission Risk Interventions No flowsheet data found.

## 2020-04-06 NOTE — Progress Notes (Signed)
Inpatient Diabetes Program Recommendations  AACE/ADA: New Consensus Statement on Inpatient Glycemic Control (2015)  Target Ranges:  Prepandial:   less than 140 mg/dL      Peak postprandial:   less than 180 mg/dL (1-2 hours)      Critically ill patients:  140 - 180 mg/dL   Lab Results  Component Value Date   GLUCAP 70 04/06/2020   HGBA1C 14.2 (H) 04/05/2020    Review of Glycemic Control  Diabetes history: DM2 Outpatient Diabetes medications: Lantus 60 units QAM, Ozempic 2 mg once per week Current orders for Inpatient glycemic control: Lantus 60 units QD, Novolog 15 units tidwc, 0-5 units QHS  HgbA1C - 14.2%  Spoke with pt regarding his diabetes and HgbA1c of 14.2%. Pt states his blood sugars have been elevated since he stopped taking Novolog 15 units tidwc. Said his doctor started him on Ozempic and stopped the Novolog at the same time. Also has not been checking blood sugars regularly and states he ran out of strips. Discussed diet and pt states he hasn't eaten any sweets in a long time. "I'm trying to control my diabetes, but think I still need the Novolog with meals.   Hypo at 70 mg/dL before dinner meal. Eating 100% meals.  Inpatient Diabetes Program Recommendations:     Add Novolog 0-9 units tidwc   Discharge home on Novolog 15 units tidwc for meal coverage. Will give pt meter and supplies before discharge.  Pt seems motivated to control his blood sugars. Wants to get HgbA1C down to 8% like last year.   Follow closely.  Thank you. Lorenda Peck, RD, LDN, CDE Inpatient Diabetes Coordinator [phone]

## 2020-04-07 DIAGNOSIS — E1122 Type 2 diabetes mellitus with diabetic chronic kidney disease: Secondary | ICD-10-CM | POA: Diagnosis not present

## 2020-04-07 DIAGNOSIS — R3912 Poor urinary stream: Secondary | ICD-10-CM | POA: Diagnosis not present

## 2020-04-07 DIAGNOSIS — R3915 Urgency of urination: Secondary | ICD-10-CM | POA: Diagnosis not present

## 2020-04-07 DIAGNOSIS — N179 Acute kidney failure, unspecified: Secondary | ICD-10-CM | POA: Diagnosis not present

## 2020-04-07 DIAGNOSIS — N1831 Chronic kidney disease, stage 3a: Secondary | ICD-10-CM | POA: Diagnosis not present

## 2020-04-07 DIAGNOSIS — N50812 Left testicular pain: Secondary | ICD-10-CM | POA: Diagnosis not present

## 2020-04-07 DIAGNOSIS — N401 Enlarged prostate with lower urinary tract symptoms: Secondary | ICD-10-CM | POA: Diagnosis not present

## 2020-04-07 DIAGNOSIS — N452 Orchitis: Secondary | ICD-10-CM | POA: Diagnosis not present

## 2020-04-07 LAB — BASIC METABOLIC PANEL
Anion gap: 6 (ref 5–15)
BUN: 16 mg/dL (ref 6–20)
CO2: 28 mmol/L (ref 22–32)
Calcium: 8.8 mg/dL — ABNORMAL LOW (ref 8.9–10.3)
Chloride: 107 mmol/L (ref 98–111)
Creatinine, Ser: 1.63 mg/dL — ABNORMAL HIGH (ref 0.61–1.24)
GFR calc Af Amer: 54 mL/min — ABNORMAL LOW (ref >60)
GFR calc non Af Amer: 46 mL/min — ABNORMAL LOW (ref >60)
Glucose, Bld: 97 mg/dL (ref 70–99)
Potassium: 4 mmol/L (ref 3.5–5.1)
Sodium: 141 mmol/L (ref 135–145)

## 2020-04-07 LAB — GLUCOSE, CAPILLARY
Glucose-Capillary: 124 mg/dL — ABNORMAL HIGH (ref 70–99)
Glucose-Capillary: 179 mg/dL — ABNORMAL HIGH (ref 70–99)
Glucose-Capillary: 167 mg/dL — ABNORMAL HIGH (ref 70–99)

## 2020-04-07 LAB — CBC
HCT: 32.5 % — ABNORMAL LOW (ref 39.0–52.0)
Hemoglobin: 10.7 g/dL — ABNORMAL LOW (ref 13.0–17.0)
MCH: 29.9 pg (ref 26.0–34.0)
MCHC: 32.9 g/dL (ref 30.0–36.0)
MCV: 90.8 fL (ref 80.0–100.0)
Platelets: 183 10*3/uL (ref 150–400)
RBC: 3.58 MIL/uL — ABNORMAL LOW (ref 4.22–5.81)
RDW: 11.6 % (ref 11.5–15.5)
WBC: 4.9 10*3/uL (ref 4.0–10.5)
nRBC: 0 % (ref 0.0–0.2)

## 2020-04-07 LAB — PSA: Prostatic Specific Antigen: 0.51 ng/mL (ref 0.00–4.00)

## 2020-04-07 MED ORDER — INSULIN ASPART 100 UNIT/ML ~~LOC~~ SOLN
0.0000 [IU] | Freq: Three times a day (TID) | SUBCUTANEOUS | Status: DC
  Administered 2020-04-07 (×2): 2 [IU] via SUBCUTANEOUS
  Administered 2020-04-07: 1 [IU] via SUBCUTANEOUS

## 2020-04-07 MED ORDER — TAMSULOSIN HCL 0.4 MG PO CAPS: 0.4000 mg | ORAL_CAPSULE | Freq: Every day | ORAL | 11 refills | Status: DC

## 2020-04-07 MED ORDER — TAMSULOSIN HCL 0.4 MG PO CAPS
0.4000 mg | ORAL_CAPSULE | Freq: Every day | ORAL | Status: DC
  Administered 2020-04-07: 0.4 mg via ORAL
  Filled 2020-04-07: qty 1

## 2020-04-07 MED ORDER — HYDROCODONE-ACETAMINOPHEN 5-325 MG PO TABS
1.0000 | ORAL_TABLET | Freq: Four times a day (QID) | ORAL | Status: DC | PRN
  Administered 2020-04-07: 1 via ORAL
  Filled 2020-04-07: qty 1

## 2020-04-07 MED ORDER — HYDROCODONE-ACETAMINOPHEN 5-325 MG PO TABS: 1.0000 | ORAL_TABLET | Freq: Four times a day (QID) | ORAL | 0 refills | Status: AC | PRN

## 2020-04-07 NOTE — Consult Note (Signed)
Urology Consult   Physician requesting consult: Zada Finders  Reason for consult: Left testicular pain  History of Present Illness: Brandon [name] is a 59 y.o. male with a one week history of constant, dull, non-radiating left testicular and flank pain.  CT and scrotal US from 04/05/20 were unremarkable.  He denies any recent or prior GU surgeries, trauma or infections.  From a urinary standpoint, he reports a weak force of stream with urgency and frequency for the past year.  No prior BPH treatment.  No PSA on file.    Past Medical History:  Diagnosis Date  . Acute ischemic stroke ([place]) 11/2013  . Allergy   . Anxiety   . Arthritis   . Benign hypertension with CKD (chronic kidney disease) stage III   . 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 CVA (cerebral vascular accident) ([place])    stroke  . Depression    BH Adm in [place] Depression  . Diabetes mellitus    DKA prior hospitalization  . Diverticulitis    s/p micorperforation Sept 2012-managed conservatively by Gen surgery  . ED (erectile dysfunction)   . Focal seizure ([place]) 11/2013   due to ischemic stroke  . GERD (gastroesophageal reflux disease)   . Hiatal hernia   . Hyperlipidemia   . Hypertension   . Kidney tumor 09/2011   Renal cell CA  . Seizures ([place])    none since 2016, taking Keppra - maw  . Sleep apnea   . Thyroid disease    "weak thyroid" per MD  . Wears glasses     Past Surgical History:  Procedure Laterality Date  . COLONOSCOPY    . IR RADIOLOGIST EVAL & MGMT  04/04/2017  . KIDNEY SURGERY     ablation of renal cell CA - 12/28, prior one was October 2012-Dr. Kathlene Cote  . TEE WITHOUT CARDIOVERSION N/A 11/19/2013   Procedure: TRANSESOPHAGEAL ECHOCARDIOGRAM (TEE);  Surgeon: Lelon Perla, MD;  Location: [facility] ENDOSCOPY;  Service: Cardiovascular;  Laterality: N/A;    Current Hospital Medications:  Home Meds:  Current Meds  Medication Sig  . amLODipine (NORVASC) 5 MG  tablet Take 5 mg by mouth daily.  [name] aspirin EC 81 MG tablet Take 1 tablet (81 mg total) by mouth daily.  [name] atorvastatin (LIPITOR) 40 MG tablet Take 1 tablet (40 mg total) by mouth daily.  [name] ezetimibe (ZETIA) 10 MG tablet Take 1 tablet (10 mg total) by mouth daily.  . hydrochlorothiazide (MICROZIDE) 12.5 MG capsule Take 1 capsule (12.5 mg total) by mouth every morning.  . insulin aspart (NOVOLOG FLEXPEN) 100 UNIT/ML FlexPen Inject 15 Units into the skin 3 (three) times daily with meals.  . insulin glargine (LANTUS SOLOSTAR) 100 UNIT/ML Solostar Pen Inject 60 Units into the skin daily. (Patient taking differently: Inject 45-60 Units into the skin daily. )  . levETIRAcetam (KEPPRA) 500 MG tablet Take 2 tablets (1,000 mg total) by mouth daily. (Patient taking differently: Take 500 mg by mouth daily. )  . omeprazole (PRILOSEC) 40 MG capsule Take 1 capsule (40 mg total) by mouth daily.  . ondansetron (ZOFRAN) 4 MG tablet Take 1 tablet (4 mg total) by mouth daily as needed for nausea or vomiting.  [name] QUEtiapine (SEROQUEL) 25 MG tablet Take 1 tablet (25 mg total) by mouth daily.  . Semaglutide, 1 MG/DOSE, (OZEMPIC, 1 MG/DOSE,) 2 MG/1.5ML SOPN Inject 2 mg into the skin once a week.  . sertraline (ZOLOFT)  100 MG tablet Take 1 tablet (100 mg total) by mouth daily.    Scheduled Meds: . amLODipine  5 mg Oral Daily  . aspirin EC  81 mg Oral Daily  . atorvastatin  40 mg Oral q1800  . ezetimibe  10 mg Oral Daily  . insulin aspart  0-5 Units Subcutaneous QHS  . insulin aspart  0-9 Units Subcutaneous TID WC  . insulin aspart  15 Units Subcutaneous TID WC  . insulin glargine  60 Units Subcutaneous Daily  . levETIRAcetam  500 mg Oral Daily  . levofloxacin  500 mg Oral QHS  . pantoprazole  80 mg Oral Daily  . QUEtiapine  25 mg Oral Daily  . sertraline  100 mg Oral Daily   Continuous Infusions: PRN Meds:.acetaminophen **OR** acetaminophen, diphenhydrAMINE, ondansetron **OR** ondansetron (ZOFRAN) IV,  senna-docusate  Allergies:  Allergies  Allergen Reactions  . Morphine Itching    Family History  Problem Relation Age of Onset  . Diabetes Father   . Heart disease Father   . Pneumonia Mother   . Prostate cancer Other   . Stomach cancer Other   . Breast cancer Sister   . Colon cancer Neg Hx   . Esophageal cancer Neg Hx   . Rectal cancer Neg Hx     Social History:  reports that he has never smoked. He has never used smokeless tobacco. He reports previous alcohol use. He reports that he does not use drugs.  ROS: A complete review of systems was performed.  All systems are negative except for pertinent findings as noted.  Physical Exam:  Vital signs in last 24 hours: Temp:  [97.5 F (36.4 C)-98.5 F (36.9 C)] 97.5 F (36.4 C) (05/04 0607) Pulse Rate:  [67-79] 77 (05/04 0607) Resp:  [17-18] 18 (05/04 0607) BP: (119-153)/(76-135) 143/89 (05/04 0607) SpO2:  [99 %-100 %] 99 % (05/04 0607) Constitutional:  Alert and oriented, No acute distress Cardiovascular: Regular rate and rhythm, No JVD Respiratory: Normal respiratory effort, Lungs clear bilaterally GI: Abdomen is soft, nontender, nondistended, no abdominal masses GU: No CVA tenderness.  Both testicles are descended and exhibit no masses or nodules.  The left testicle is mildly tender to palpation.  No scrotal erythema or cellulitis appreciated.  The penis is uncircumcised with no blood at the urethral meatus.  Lymphatic: No lymphadenopathy Neurologic: Grossly intact, no focal deficits Psychiatric: Normal mood and affect  Laboratory Data:  Recent Labs    04/05/20 2119 04/06/20 0259 04/07/20 0446  WBC 5.6 5.7 4.9  HGB 11.6* 11.1* 10.7*  HCT 34.1* 33.3* 32.5*  PLT 212 190 183    Recent Labs    04/05/20 2119 04/06/20 0259 04/07/20 0446  NA 133* 134* 141  K 3.8 3.4* 4.0  CL 97* 100 107  GLUCOSE 499* 410* 97  BUN 24* 22* 16  CALCIUM 8.9 8.6* 8.8*  CREATININE 2.48* 2.17* 1.63*     Results for orders  placed or performed during the hospital encounter of 04/05/20 (from the past 24 hour(s))  Glucose, capillary     Status: Abnormal   Collection Time: 04/06/20 11:34 AM  Result Value Ref Range   Glucose-Capillary 224 (H) 70 - 99 mg/dL  Glucose, capillary     Status: None   Collection Time: 04/06/20  4:49 PM  Result Value Ref Range   Glucose-Capillary 70 70 - 99 mg/dL  Glucose, capillary     Status: Abnormal   Collection Time: 04/06/20 10:00 PM  Result Value Ref Range  Glucose-Capillary 144 (H) 70 - 99 mg/dL  CBC     Status: Abnormal   Collection Time: 04/07/20  4:46 AM  Result Value Ref Range   WBC 4.9 4.0 - 10.5 K/uL   RBC 3.58 (L) 4.22 - 5.81 MIL/uL   Hemoglobin 10.7 (L) 13.0 - 17.0 g/dL   HCT 32.5 (L) 39.0 - 52.0 %   MCV 90.8 80.0 - 100.0 fL   MCH 29.9 26.0 - 34.0 pg   MCHC 32.9 30.0 - 36.0 g/dL   RDW 11.6 11.5 - 15.5 %   Platelets 183 150 - 400 K/uL   nRBC 0.0 0.0 - 0.2 %  Basic metabolic panel     Status: Abnormal   Collection Time: 04/07/20  4:46 AM  Result Value Ref Range   Sodium 141 135 - 145 mmol/L   Potassium 4.0 3.5 - 5.1 mmol/L   Chloride 107 98 - 111 mmol/L   CO2 28 22 - 32 mmol/L   Glucose, Bld 97 70 - 99 mg/dL   BUN 16 6 - 20 mg/dL   Creatinine, Ser 1.63 (H) 0.61 - 1.24 mg/dL   Calcium 8.8 (L) 8.9 - 10.3 mg/dL   GFR calc non Af Amer 46 (L) >60 mL/min   GFR calc Af Amer 54 (L) >60 mL/min   Anion gap 6 5 - 15  Glucose, capillary     Status: Abnormal   Collection Time: 04/07/20  7:32 AM  Result Value Ref Range   Glucose-Capillary 124 (H) 70 - 99 mg/dL   Recent Results (from the past 240 hour(s))  Culture, Urine     Status: Abnormal   Collection Time: 04/05/20  9:19 PM   Specimen: Urine, Clean Catch  Result Value Ref Range Status   Specimen Description   Final    URINE, CLEAN CATCH Performed at [facility], [place] [address], [place], [place] [address]    Special Requests   Final    NONE Performed at [facility],  [place] [address], [place],  [address]    Culture MULTIPLE SPECIES PRESENT, SUGGEST RECOLLECTION (A)  Final   Report Status 04/06/2020 FINAL  Final  Respiratory Panel by RT PCR (Flu A&B, Covid) - Nasopharyngeal Swab     Status: None   Collection Time: 04/05/20 11:51 PM   Specimen: Nasopharyngeal Swab  Result Value Ref Range Status   SARS Coronavirus 2 by RT PCR NEGATIVE NEGATIVE Final    Comment: (NOTE) SARS-CoV-2 target nucleic acids are NOT DETECTED. The SARS-CoV-2 RNA is generally detectable in upper respiratoy specimens during the acute phase of infection. The lowest concentration of SARS-CoV-2 viral copies this assay can detect is 131 copies/mL. A negative result does not preclude SARS-Cov-2 infection and should not be used as the sole basis for treatment or other patient management decisions. A negative result may occur with  improper specimen collection/handling, submission of specimen other than nasopharyngeal swab, presence of viral mutation(s) within the areas targeted by this assay, and inadequate number of viral copies (<131 copies/mL). A negative result must be combined with clinical observations, patient history, and epidemiological information. The expected result is Negative. Fact Sheet for Patients:  PinkCheek.be Fact Sheet for Healthcare Providers:  GravelBags.it This test is not yet ap proved or cleared by the [place] FDA and  has been authorized for detection and/or diagnosis of SARS-CoV-2 by FDA under an Emergency Use Authorization (EUA). This EUA will remain  in effect (meaning this test can be used) for  the duration of the COVID-19 declaration under Section 564(b)(1) of the Act, 21 U.S.C. section 360bbb-3(b)(1), unless the authorization is terminated or revoked sooner.    Influenza A by PCR NEGATIVE NEGATIVE Final   Influenza B by PCR NEGATIVE NEGATIVE Final    Comment: (NOTE) The Xpert  Xpress SARS-CoV-2/FLU/RSV assay is intended as an aid in  the diagnosis of influenza from Nasopharyngeal swab specimens and  should not be used as a sole basis for treatment. Nasal washings and  aspirates are unacceptable for Xpert Xpress SARS-CoV-2/FLU/RSV  testing. Fact Sheet for Patients: PinkCheek.be Fact Sheet for Healthcare Providers: GravelBags.it This test is not yet approved or cleared by the [place] FDA and  has been authorized for detection and/or diagnosis of SARS-CoV-2 by  FDA under an Emergency Use Authorization (EUA). This EUA will remain  in effect (meaning this test can be used) for the duration of the  Covid-19 declaration under Section 564(b)(1) of the Act, 21  U.S.C. section 360bbb-3(b)(1), unless the authorization is  terminated or revoked. Performed at [facility], [place] [address] [place], [place] [address]     Renal Function: Recent Labs    04/05/20 2119 04/06/20 0259 04/07/20 0446  CREATININE 2.48* 2.17* 1.63*   Estimated Creatinine Clearance: 63.7 mL/min (A) (by C-G formula based on SCr of 1.63 mg/dL (H)).  Radiologic Imaging: CT Renal Stone Study  Result Date: 04/05/2020 CLINICAL DATA:  59 year old male with abdominal pain. EXAM: CT ABDOMEN AND PELVIS WITHOUT CONTRAST TECHNIQUE: Multidetector CT imaging of the abdomen and pelvis was performed following the standard protocol without IV contrast. COMPARISON:  Abdominal ultrasound dated 02/19/2020 and CT dated 02/05/2018. FINDINGS: Evaluation of this exam is limited in the absence of intravenous contrast. Lower chest: The visualized lung bases are clear. Coronary vascular calcifications noted. No intra-abdominal free air or free fluid. Hepatobiliary: The liver is unremarkable. No intrahepatic biliary ductal dilatation. Probable minimal layering stone within the gallbladder. No pericholecystic fluid or evidence of acute  cholecystitis by CT. Pancreas: Unremarkable. No pancreatic ductal dilatation or surrounding inflammatory changes. Spleen: Normal in size without focal abnormality. Adrenals/Urinary Tract: The adrenal glands are unremarkable. No significant interval change in areas of cryoablation in the kidneys bilaterally. No hydronephrosis or nephrolithiasis. The visualized ureters and urinary bladder appear unremarkable. Stomach/Bowel: There is sigmoid diverticulosis without active inflammatory changes. There is no bowel obstruction or active inflammation. The appendix is normal. Vascular/Lymphatic: Mild aortoiliac atherosclerotic disease. The IVC is unremarkable. No portal venous gas. There is no adenopathy. Reproductive: The prostate and seminal vesicles are unremarkable. No pelvic mass. Other: None Musculoskeletal: Degenerative changes of the spine. No acute osseous pathology. IMPRESSION: 1. No acute intra-abdominal or pelvic pathology. 2. Sigmoid diverticulosis. No bowel obstruction. Normal appendix. 3. Aortic Atherosclerosis (ICD10-I70.0). 4. No significant interval change in the cryoablation areas in the bilateral kidneys. Electronically Signed   By: Anner Crete M.D.   On: 04/05/2020 23:05   US SCROTUM W/DOPPLER  Result Date: 04/05/2020 CLINICAL DATA:  59 year old male with left testicular pain. EXAM: SCROTAL ULTRASOUND DOPPLER ULTRASOUND OF THE TESTICLES TECHNIQUE: Complete ultrasound examination of the testicles, epididymis, and other scrotal structures was performed. Color and spectral Doppler ultrasound were also utilized to evaluate blood flow to the testicles. COMPARISON:  Testicular ultrasound dated 07/25/2016. FINDINGS: Right testicle Measurements: 4.0 x 2.0 x 2.4 cm. The right testicle is heterogeneous. There is a geographic area of decreased echogenicity within the right testicle. This may represent an area of edema related to underlying inflammation,  although an infiltrative mass is not excluded.  Correlation with clinical exam, and urinalysis and close follow-up with ultrasound after treatment and resolution of acute symptoms recommended. If findings persist on follow-up ultrasound, further evaluation with MRI is warranted. Left testicle Measurements: 4.3 x 1.7 x 2.7 cm. No mass or microlithiasis visualized. Right epididymis:  Normal in size and appearance. Left epididymis:  Normal in size and appearance. Hydrocele:  There is a small, mildly complex right hydrocele. Varicocele:  None visualized. Pulsed Doppler interrogation of both testes demonstrates normal low resistance arterial and venous waveforms bilaterally. IMPRESSION: 1. Heterogeneous right testicle as described above may be related to an inflammatory/infectious etiology. Clinical correlation and follow-up recommended. There is a small minimally complex right hydrocele. 2. Unremarkable left testicle. Electronically Signed   By: Anner Crete M.D.   On: 04/05/2020 22:36    I independently reviewed the above imaging studies.  Impression/Recommendation 59 year old male with left sided orchalgia and BPH with a weak FOS, urgency and frequency.  -CT and SUS results discussed with the patient.  No clear etiology of his pain.  Will start him on tamsulosin 0.4 mg once daily to treat his ongoing LUTS, which may be contributing to his orchalgia.  PSA pending.  Follow-up as an OP in 6 weeks.  Ellison Hughs, MD [facility] 04/07/2020, 10:12 AM

## 2020-04-07 NOTE — Care Management Obs Status (Signed)
[place] NOTIFICATION   Patient Details  Name: Brandon [name] MRN: [id number] Date of Birth: 1961-10-05   Medicare Observation Status Notification Given:   yes    Lennart Pall, [place] 04/07/2020, 2:29 PM

## 2020-04-07 NOTE — Discharge Summary (Signed)
Physician Discharge Summary  Brandon [name] EVO:[id number] DOB: 11/24/1961   PCP: Carlena Hurl, PA-C  Admit date: 04/05/2020 Discharge date: 04/07/2020 Length of Stay: 0 days   Code Status: Full Code  Admitted From:  Home Discharged to:   [place]:  PT Equipment/Devices: RW Discharge Condition: improving  Recommendations for Outpatient Follow-up   1. Follow up with Urology in 6 weeks 2. Started on tamsulosin 3. Follow-up PSA  Hospital Summary  Brandon [name] is a 59 y.o. male with a history of insulin-dependent type 2 diabetes, CVA with residual left-sided weakness, seizure disorder, CKD 3 a, bilateral renal cell carcinoma in 2012 s/p bilateral cryoablation, hypertension, hyperlipidemia who was admitted for evaluation of abdominal pain and acute left sided testicular pain which was intermittent for the past week and acutely worsened on ambulation and started on Levaquin for concern of orchitis.    Denied penile discharge, swelling or erythema.  Has chronic left-sided flank pain.  CT renal stone study was negative for any acute issues and scrotal ultrasound showing heterogenous right testicle with minimally complex right hydrocele and unremarkable left testicle.  5/4: Urology evaluated the patient and did not have a clear etiology of his pain.  He was started on tamsulosin to treat ongoing LUTS which was thought to be contributing to orchalgia, PSA pending and recommended outpatient follow-up in 6 weeks.  Discharged in stable condition with pain regimen, rolling walker and home health PT  A & P   Principal Problem:   Acute renal failure superimposed on stage 3a chronic kidney disease ([place]) Active Problems:   Depression, recurrent ([place])   Dyslipidemia associated with type 2 diabetes mellitus ([place])   Hypertension associated with diabetes ([place])   History of stroke   Seizure disorder ([place])   Type 2 diabetes mellitus with stage 3a chronic kidney disease, with long-term  current use of insulin ([place])   Abnormal radiologic findings on diagnostic imaging of right testicle    1. Left flank and left testicular pain of unknown etiology with history of bilateral renal cell carcinoma status post cryoablation in 2012 a. CT and scrotal ultrasound unremarkable for left-sided findings but positive for right-sided hydrocele b. Still with some tenderness on exam but seems improved c. Urology consulted: started on tamsulosin for LUTS which may help with orchalgia, needs OP follow up in 6 weeks d. Completed 1 day levaquin, ok to discontinue after discussion with urology over the phone 2. AKI likely from decreased p.o. intake a. Nearly at baseline at discharge after getting IV fluids 3. Chronic left-sided abdominal discomfort likely secondary renal calcifications? 4. Seizure disorder on Keppra 5. Hypertension on amlodipine and held home HCTZ during admission due to AKI 6. Hyperlipidemia on statin 7. GERD on Protonix 8. Mood disorder on Seroquel and sertraline 9. History of CVA with residual left-sided weakness on aspirin and statin     Consultants  . Urology  Procedures  . None  Antibiotics   Anti-infectives (From admission, onward)   Start     Dose/Rate Route Frequency Ordered Stop   04/06/20 2200  levofloxacin (LEVAQUIN) tablet 500 mg     500 mg Oral Daily at bedtime 04/06/20 0057 04/15/20 2159   04/06/20 0000  levofloxacin (LEVAQUIN) IVPB 500 mg     500 mg 100 mL/hr over 60 Minutes Intravenous  Once 04/05/20 2347 04/06/20 0103       Subjective  Seen and examined at bedside no acute distress resting comfortably.  States his pain is somewhat  improved.  Tolerating diet.  Hoping to go home sometime today.  No other issues or complaints.  Objective   Discharge Exam: Vitals:   04/07/20 0607 04/07/20 1329  BP: (!) 143/89 (!) 144/94  Pulse: 77 75  Resp: 18 18  Temp: (!) 97.5 F (36.4 C) 97.6 F (36.4 C)  SpO2: 99% 100%   Vitals:   04/06/20 1756  04/06/20 2203 04/07/20 0607 04/07/20 1329  BP: (!) 153/135 132/85 (!) 143/89 (!) 144/94  Pulse: 73 71 77 75  Resp: '17 18 18 18  ' Temp: 98.5 F (36.9 C) 97.7 F (36.5 C) (!) 97.5 F (36.4 C) 97.6 F (36.4 C)  TempSrc:  Oral Oral   SpO2: 100% 100% 99% 100%  Weight:      Height:        Physical Exam Vitals and nursing note reviewed.  Constitutional:      Appearance: Normal appearance.  HENT:     Head: Normocephalic and atraumatic.  Eyes:     Conjunctiva/sclera: Conjunctivae normal.  Cardiovascular:     Rate and Rhythm: Normal rate and regular rhythm.  Pulmonary:     Effort: Pulmonary effort is normal.     Breath sounds: Normal breath sounds.  Abdominal:     Comments: Mild left-sided abdominal tenderness to palpation  Musculoskeletal:        General: No swelling or tenderness.  Skin:    Coloration: Skin is not jaundiced or pale.  Neurological:     Mental Status: He is alert. Mental status is at baseline.  Psychiatric:        Mood and Affect: Mood normal.        Behavior: Behavior normal.       The results of significant diagnostics from this hospitalization (including imaging, microbiology, ancillary and laboratory) are listed below for reference.     Microbiology: Recent Results (from the past 240 hour(s))  Culture, Urine     Status: Abnormal   Collection Time: 04/05/20  9:19 PM   Specimen: Urine, Clean Catch  Result Value Ref Range Status   Specimen Description   Final    URINE, CLEAN CATCH Performed at [facility], [place] [address] [place], [place] [address]    Special Requests   Final    NONE Performed at [facility], [place] [address] [place], [place] [address]    Culture MULTIPLE SPECIES PRESENT, SUGGEST RECOLLECTION (A)  Final   Report Status 04/06/2020 FINAL  Final  Respiratory Panel by RT PCR (Flu A&B, Covid) - Nasopharyngeal Swab     Status: None   Collection Time: 04/05/20 11:51 PM   Specimen: Nasopharyngeal  Swab  Result Value Ref Range Status   SARS Coronavirus 2 by RT PCR NEGATIVE NEGATIVE Final    Comment: (NOTE) SARS-CoV-2 target nucleic acids are NOT DETECTED. The SARS-CoV-2 RNA is generally detectable in upper respiratoy specimens during the acute phase of infection. The lowest concentration of SARS-CoV-2 viral copies this assay can detect is 131 copies/mL. A negative result does not preclude SARS-Cov-2 infection and should not be used as the sole basis for treatment or other patient management decisions. A negative result may occur with  improper specimen collection/handling, submission of specimen other than nasopharyngeal swab, presence of viral mutation(s) within the areas targeted by this assay, and inadequate number of viral copies (<131 copies/mL). A negative result must be combined with clinical observations, patient history, and epidemiological information. The expected result is Negative. Fact Sheet for Patients:  PinkCheek.be Fact Sheet for Healthcare Providers:  GravelBags.it This test is not yet ap proved or cleared by the [place] FDA and  has been authorized for detection and/or diagnosis of SARS-CoV-2 by FDA under an Emergency Use Authorization (EUA). This EUA will remain  in effect (meaning this test can be used) for the duration of the COVID-19 declaration under Section 564(b)(1) of the Act, 21 U.S.C. section 360bbb-3(b)(1), unless the authorization is terminated or revoked sooner.    Influenza A by PCR NEGATIVE NEGATIVE Final   Influenza B by PCR NEGATIVE NEGATIVE Final    Comment: (NOTE) The Xpert Xpress SARS-CoV-2/FLU/RSV assay is intended as an aid in  the diagnosis of influenza from Nasopharyngeal swab specimens and  should not be used as a sole basis for treatment. Nasal washings and  aspirates are unacceptable for Xpert Xpress SARS-CoV-2/FLU/RSV  testing. Fact Sheet for  Patients: PinkCheek.be Fact Sheet for Healthcare Providers: GravelBags.it This test is not yet approved or cleared by the [place] FDA and  has been authorized for detection and/or diagnosis of SARS-CoV-2 by  FDA under an Emergency Use Authorization (EUA). This EUA will remain  in effect (meaning this test can be used) for the duration of the  Covid-19 declaration under Section 564(b)(1) of the Act, 21  U.S.C. section 360bbb-3(b)(1), unless the authorization is  terminated or revoked. Performed at [facility], [place] [address], [place], [place] [address]      Labs: BNP (last 3 results) Recent Labs    07/03/19 1100  BNP 53.9   Basic Metabolic Panel: Recent Labs  Lab 04/05/20 2119 04/06/20 0259 04/07/20 0446  NA 133* 134* 141  K 3.8 3.4* 4.0  CL 97* 100 107  CO2 '27 23 28  ' GLUCOSE 499* 410* 97  BUN 24* 22* 16  CREATININE 2.48* 2.17* 1.63*  CALCIUM 8.9 8.6* 8.8*   Liver Function Tests: Recent Labs  Lab 04/05/20 2119  AST 22  ALT 20  ALKPHOS 122  BILITOT 1.2  PROT 7.6  ALBUMIN 4.2   Recent Labs  Lab 04/05/20 2119  LIPASE 34   No results for input(s): AMMONIA in the last 168 hours. CBC: Recent Labs  Lab 04/05/20 2119 04/06/20 0259 04/07/20 0446  WBC 5.6 5.7 4.9  HGB 11.6* 11.1* 10.7*  HCT 34.1* 33.3* 32.5*  MCV 88.6 89.0 90.8  PLT 212 190 183   Cardiac Enzymes: No results for input(s): CKTOTAL, CKMB, CKMBINDEX, TROPONINI in the last 168 hours. BNP: Invalid input(s): POCBNP CBG: Recent Labs  Lab 04/06/20 1134 04/06/20 1649 04/06/20 2200 04/07/20 0732 04/07/20 1134  GLUCAP 224* 70 144* 124* 179*   D-Dimer No results for input(s): DDIMER in the last 72 hours. Hgb A1c Recent Labs    04/05/20 2150  HGBA1C 14.2*   Lipid Profile No results for input(s): CHOL, HDL, LDLCALC, TRIG, CHOLHDL, LDLDIRECT in the last 72 hours. Thyroid function studies No results for  input(s): TSH, T4TOTAL, T3FREE, THYROIDAB in the last 72 hours.  Invalid input(s): FREET3 Anemia work up No results for input(s): VITAMINB12, FOLATE, FERRITIN, TIBC, IRON, RETICCTPCT in the last 72 hours. Urinalysis    Component Value Date/Time   COLORURINE YELLOW 04/05/2020 2119   APPEARANCEUR CLEAR 04/05/2020 2119   LABSPEC 1.024 04/05/2020 2119   LABSPEC 1.030 05/23/2017 1538   PHURINE 5.0 04/05/2020 2119   GLUCOSEU >=500 (A) 04/05/2020 2119   HGBUR NEGATIVE 04/05/2020 2119   BILIRUBINUR NEGATIVE 04/05/2020 2119   BILIRUBINUR negative 05/23/2017 1538   BILIRUBINUR  neg 11/04/2016 1619   KETONESUR NEGATIVE 04/05/2020 2119   PROTEINUR >=300 (A) 04/05/2020 2119   UROBILINOGEN negative 11/04/2016 1619   UROBILINOGEN 1.0 12/13/2014 1029   NITRITE NEGATIVE 04/05/2020 2119   LEUKOCYTESUR NEGATIVE 04/05/2020 2119   Sepsis Labs Invalid input(s): PROCALCITONIN,  WBC,  LACTICIDVEN Microbiology Recent Results (from the past 240 hour(s))  Culture, Urine     Status: Abnormal   Collection Time: 04/05/20  9:19 PM   Specimen: Urine, Clean Catch  Result Value Ref Range Status   Specimen Description   Final    URINE, CLEAN CATCH Performed at [facility], [place] [address], [place], [place] [address]    Special Requests   Final    NONE Performed at [facility], [place] [address] [place], [place] [address]    Culture MULTIPLE SPECIES PRESENT, SUGGEST RECOLLECTION (A)  Final   Report Status 04/06/2020 FINAL  Final  Respiratory Panel by RT PCR (Flu A&B, Covid) - Nasopharyngeal Swab     Status: None   Collection Time: 04/05/20 11:51 PM   Specimen: Nasopharyngeal Swab  Result Value Ref Range Status   SARS Coronavirus 2 by RT PCR NEGATIVE NEGATIVE Final    Comment: (NOTE) SARS-CoV-2 target nucleic acids are NOT DETECTED. The SARS-CoV-2 RNA is generally detectable in upper respiratoy specimens during the acute phase of infection. The lowest concentration  of SARS-CoV-2 viral copies this assay can detect is 131 copies/mL. A negative result does not preclude SARS-Cov-2 infection and should not be used as the sole basis for treatment or other patient management decisions. A negative result may occur with  improper specimen collection/handling, submission of specimen other than nasopharyngeal swab, presence of viral mutation(s) within the areas targeted by this assay, and inadequate number of viral copies (<131 copies/mL). A negative result must be combined with clinical observations, patient history, and epidemiological information. The expected result is Negative. Fact Sheet for Patients:  PinkCheek.be Fact Sheet for Healthcare Providers:  GravelBags.it This test is not yet ap proved or cleared by the [place] FDA and  has been authorized for detection and/or diagnosis of SARS-CoV-2 by FDA under an Emergency Use Authorization (EUA). This EUA will remain  in effect (meaning this test can be used) for the duration of the COVID-19 declaration under Section 564(b)(1) of the Act, 21 U.S.C. section 360bbb-3(b)(1), unless the authorization is terminated or revoked sooner.    Influenza A by PCR NEGATIVE NEGATIVE Final   Influenza B by PCR NEGATIVE NEGATIVE Final    Comment: (NOTE) The Xpert Xpress SARS-CoV-2/FLU/RSV assay is intended as an aid in  the diagnosis of influenza from Nasopharyngeal swab specimens and  should not be used as a sole basis for treatment. Nasal washings and  aspirates are unacceptable for Xpert Xpress SARS-CoV-2/FLU/RSV  testing. Fact Sheet for Patients: PinkCheek.be Fact Sheet for Healthcare Providers: GravelBags.it This test is not yet approved or cleared by the [place] FDA and  has been authorized for detection and/or diagnosis of SARS-CoV-2 by  FDA under an Emergency Use Authorization (EUA).  This EUA will remain  in effect (meaning this test can be used) for the duration of the  Covid-19 declaration under Section 564(b)(1) of the Act, 21  U.S.C. section 360bbb-3(b)(1), unless the authorization is  terminated or revoked. Performed at [facility], [place] [address] [place], [place] [address]     Discharge Instructions     Discharge Instructions    Diet - low sodium heart healthy  Complete by: As directed    Discharge instructions   Complete by: As directed    You were seen and examined in the hospital for left testicular pain and cared for by a hospitalist and urologist  Upon Discharge:  - start taking tamsulosin daily - you can take Norco every 6 hours as needed for severe testicular pain every  - follow up with Urology in the office in 6 weeks   Request that your primary physician go over all hospital tests and procedures/radiological results at the follow up.   Please get all hospital records sent to your physician by signing a hospital release before you go home.   Read the complete instructions along with all the possible side effects for all the medicines you take and that have been prescribed to you. Take any new medicines after you have completely understood and accept all the possible adverse reactions/side effects.   If you have any questions about your discharge medications or the care you received while you were in the hospital, you can call the unit and asked to speak with the hospitalist on call. Once you are discharged, your primary care physician will handle any further medical issues. Please note that NO REFILLS for any discharge medications will be authorized, as it is imperative that you return to your primary care physician (or establish a relationship with a primary care physician if you do not have one) for your aftercare needs so that they can reassess your need for medications and monitor your lab values.   Do not drive,  operate heavy machinery, perform activities at heights, swimming or participation in water activities or provide baby sitting services if your were admitted for loss of consciousness/seizures or if you are on sedating medications including, but not limited to benzodiazepines, sleep medications, narcotic pain medications, etc., until you have been cleared to do so by a medical doctor.   Do not take more than prescribed medications.   Wear a seat belt while driving.  If you have smoked or chewed Tobacco in the last 2 years please stop smoking; also stop any regular Alcohol and/or any Recreational drug use including marijuana.  If you experience worsening of your admission symptoms or develop shortness of breath, chest pain, suicidal or homicidal thoughts or experience a life threatening emergency, you must seek medical attention immediately by calling 911 or calling your PCP immediately.   Increase activity slowly   Complete by: As directed      Allergies as of 04/07/2020      Reactions   Morphine Itching      Medication List    TAKE these medications   ACCU-CHEK ACTIVE STRIPS test strip Generic drug: glucose blood Use as instructed   glucose blood test strip accu chek active 1-2 times daily   accu-chek soft touch lancets Use as instructed   amLODipine 5 MG tablet Commonly known as: NORVASC Take 5 mg by mouth daily.   aspirin EC 81 MG tablet Take 1 tablet (81 mg total) by mouth daily.   atorvastatin 40 MG tablet Commonly known as: LIPITOR Take 1 tablet (40 mg total) by mouth daily.   blood glucose meter kit and supplies Dispense based on patient and insurance preference. Use up to four times daily as directed. (FOR ICD-9 250.00, 250.01).   ezetimibe 10 MG tablet Commonly known as: ZETIA Take 1 tablet (10 mg total) by mouth daily.   hydrochlorothiazide 12.5 MG capsule Commonly known as: MICROZIDE Take 1 capsule (12.5  mg total) by mouth every morning.    HYDROcodone-acetaminophen 5-325 MG tablet Commonly known as: NORCO/VICODIN Take 1 tablet by mouth every 6 (six) hours as needed for up to 3 days for severe pain.   Lantus SoloStar 100 UNIT/ML Solostar Pen Generic drug: insulin glargine Inject 60 Units into the skin daily. What changed: how much to take   levETIRAcetam 500 MG tablet Commonly known as: Keppra Take 2 tablets (1,000 mg total) by mouth daily. What changed: how much to take   NovoLOG FlexPen 100 UNIT/ML FlexPen Generic drug: insulin aspart Inject 15 Units into the skin 3 (three) times daily with meals.   omeprazole 40 MG capsule Commonly known as: PRILOSEC Take 1 capsule (40 mg total) by mouth daily.   ondansetron 4 MG tablet Commonly known as: Zofran Take 1 tablet (4 mg total) by mouth daily as needed for nausea or vomiting.   Ozempic (1 MG/DOSE) 2 MG/1.5ML Sopn Generic drug: Semaglutide (1 MG/DOSE) Inject 2 mg into the skin once a week.   Pen Needles 32G X 4 MM Misc 1 each by Does not apply route daily. Use for insulin pens   QUEtiapine 25 MG tablet Commonly known as: SEROQUEL Take 1 tablet (25 mg total) by mouth daily.   sertraline 100 MG tablet Commonly known as: ZOLOFT Take 1 tablet (100 mg total) by mouth daily.   tamsulosin 0.4 MG Caps capsule Commonly known as: FLOMAX Take 1 capsule (0.4 mg total) by mouth daily.            Durable Medical Equipment  (From admission, onward)         Start     Ordered   04/07/20 1600  DME Walker  Once    Question Answer Comment  Walker: With 5 Inch Wheels   Patient needs a walker to treat with the following condition Ambulatory dysfunction      04/07/20 1559         Follow-up Information    Ceasar Mons, MD In 6 weeks.   Specialty: Urology Why: Call to schedule appointment  Contact information: [address] [place] [address] [phone]          Allergies  Allergen Reactions  . Morphine Itching    Dispo: The patient is from: Home              Anticipated d/c is to: Home              Anticipated d/c date is: home              Patient currently is medically stable to d/c.       Time coordinating discharge: Over 30 minutes   SIGNED:   [name], D.O. [facility] Pager: [phone]  04/07/2020, 4:05 PM

## 2020-04-07 NOTE — TOC Transition Note (Addendum)
Transition of Care [facility]) - CM/SW Discharge Note   Patient Details  Name: Brandon [name] MRN: [id number] Date of Birth: 11-28-1961  [facility] CM/SW Contact:  Lennart Pall, LCSW Phone Number: 04/07/2020, 4:01 PM   Clinical Narrative:    MD has cleared pt for d/c today.  He is aware and agreeable.  Feeling better today and reports that his friend, [name], is able to pick him up ~ 7pm.  Have begun paperwork for [facility] aide (under Medicaid coverage) and will send to PCP office for completion - pt aware and grateful.  DME ordered.  Pt aware that PT recommending HHPT - he wants to discuss with his friend to make sure OK with her. I will contact him at home tomorrow to see if he wants me to arrange. No further needs.   Final next level of care: Home/Self Care Barriers to Discharge: Barriers Resolved   Patient Goals and CMS Choice Patient states their goals for this hospitalization and ongoing recovery are:: pt hopeful he might get some aide assistance in the home      Discharge Placement                       Discharge Plan and Services In-house Referral: Clinical Social Work Discharge Planning Services: Other - See comment(pt hopeful for aide if medicaid eligible)            DME Arranged: Walker rolling DME Agency: [employer] Date DME Agency Contacted: 04/07/20 Time DME Agency Contacted: [phone] Representative spoke with at DME Agency: [place]: PCS/Personal Care Services(at time of d/c, paperwork for [facility] aide being sent to PCP office for completion)          Social Determinants of Health (SDOH) Interventions     Readmission Risk Interventions No flowsheet data found.

## 2020-04-07 NOTE — Evaluation (Signed)
Physical Therapy Evaluation Patient Details Name: Brandon [name] MRN: [id number] DOB: 10/18/1961 Today's Date: 04/07/2020   History of Present Illness  59 yo male admitted with ARF, L flank + testicular pain. Hx of DM, CVA, Sz, CKD, renal carcinoma  Clinical Impression  On eval, pt required Min assist for mobility. He walked ~50 feet with use of a RW. Pt presents with general weakness, decreased activity tolerance, and impaired gait and balance. Pt required some assistance to don shoes, especially L shoe (L side is weak from prior CVA). Discussed d/c plan-pt politely declines rehab placement. He prefers to return home. He is requesting some in home assistance. Will recommend HHPT f/u and a home health aide, if possible. Will also recommend a RW for ambulation safety. Will follow and progress activity during hospital stay.     Follow Up Recommendations Home health PT;Supervision - Intermittent; Home Health Aide-if possible. Pt declines rehab placement.    Equipment Recommendations  Rolling walker with 5" wheels    Recommendations for Other Services       Precautions / Restrictions Precautions Precautions: Fall Restrictions Weight Bearing Restrictions: No      Mobility  Bed Mobility               General bed mobility comments: oob in recliner  Transfers Overall transfer level: Needs assistance Equipment used: Rolling walker (2 wheeled) Transfers: Sit to/from Stand Sit to Stand: Min assist         General transfer comment: Assist to power up, steady, control descent. VCs safety, technique, hand placement.  Ambulation/Gait Ambulation/Gait assistance: Min guard Gait Distance (Feet): 50 Feet Assistive device: Rolling walker (2 wheeled) Gait Pattern/deviations: Step-to pattern     General Gait Details: Min guard for safety. L knee instability noted interimttently but no overt buckling occurred. Pt fatigues fairly easily.  Stairs            Wheelchair  Mobility    Modified Rankin (Stroke Patients Only)       Balance Overall balance assessment: History of Falls;Needs assistance         Standing balance support: Bilateral upper extremity supported Standing balance-Leahy Scale: Fair                               Pertinent Vitals/Pain Pain Assessment: 0-10 Pain Score: 8  Pain Location: L flank Pain Descriptors / Indicators: Discomfort;Sore Pain Intervention(s): Monitored during session;Limited activity within patient's tolerance;Repositioned    Home Living Family/patient expects to be discharged to:: Private residence Living Arrangements: Non-relatives/Friends Available Help at Discharge: Available PRN/intermittently;Friend(s) Type of Home: Apartment Home Access: Stairs to enter Entrance Stairs-Rails: Psychiatric nurse of Steps: 1 flight Home Layout: One level Home Equipment: Cane - single point      Prior Function Level of Independence: Independent with assistive device(s)         Comments: uses cane for short distance ambulation. Needs assistance for LB dressing sometimes.     Hand Dominance        Extremity/Trunk Assessment   Upper Extremity Assessment Upper Extremity Assessment: Generalized weakness    Lower Extremity Assessment Lower Extremity Assessment: LLE deficits/detail LLE Deficits / Details: Strength 4/5 throughout although L knee instabilty noted with ambulation    Cervical / Trunk Assessment Cervical / Trunk Assessment: Normal  Communication   Communication: No difficulties  Cognition Arousal/Alertness: Awake/alert Behavior During Therapy: WFL for tasks assessed/performed Overall Cognitive Status: Within Functional Limits  for tasks assessed                                        General Comments      Exercises     Assessment/Plan    PT Assessment Patient needs continued PT services  PT Problem List Decreased strength;Decreased  mobility;Decreased activity tolerance;Decreased balance;Decreased knowledge of use of DME;Pain       PT Treatment Interventions DME instruction;Gait training;Therapeutic activities;Therapeutic exercise;Patient/family education;Balance training;Functional mobility training    PT Goals (Current goals can be found in the Care Plan section)  Acute Rehab PT Goals Patient Stated Goal: home. to be able to find another place to stay soon. PT Goal Formulation: With patient Time For Goal Achievement: 04/21/20 Potential to Achieve Goals: Good    Frequency Min 3X/week   Barriers to discharge        Co-evaluation               AM-PAC PT "6 Clicks" Mobility  Outcome Measure Help needed turning from your back to your side while in a flat bed without using bedrails?: A Little Help needed moving from lying on your back to sitting on the side of a flat bed without using bedrails?: A Little Help needed moving to and from a bed to a chair (including a wheelchair)?: A Little Help needed standing up from a chair using your arms (e.g., wheelchair or bedside chair)?: A Little Help needed to walk in hospital room?: A Little Help needed climbing 3-5 steps with a railing? : A Little 6 Click Score: 18    End of Session Equipment Utilized During Treatment: Gait belt Activity Tolerance: Patient limited by fatigue Patient left: in chair;with call bell/phone within reach;with chair alarm set   PT Visit Diagnosis: Unsteadiness on feet (R26.81);History of falling (Z91.81);Muscle weakness (generalized) (M62.81);Difficulty in walking, not elsewhere classified (R26.2);Hemiplegia and hemiparesis Hemiplegia - Right/Left: Left Hemiplegia - caused by: Cerebral infarction    Time: 1327-1350 PT Time Calculation (min) (ACUTE ONLY): 23 min   Charges:   PT Evaluation $PT Eval Low Complexity: 1 Low PT Treatments $Gait Training: 8-22 mins           Doreatha Massed, PT Acute Rehabilitation

## 2020-04-08 ENCOUNTER — Ambulatory Visit: Payer: Self-pay

## 2020-04-08 ENCOUNTER — Telehealth (HOSPITAL_COMMUNITY): Payer: Self-pay

## 2020-04-08 ENCOUNTER — Telehealth: Payer: Self-pay | Admitting: Medical

## 2020-04-08 NOTE — Telephone Encounter (Signed)
schedule hospital f/u for form completion and review, this may need to be virtual for their benefit if they can't come in

## 2020-04-08 NOTE — Telephone Encounter (Signed)
Lmom for patient to call and schedule appointment. 

## 2020-04-15 NOTE — Telephone Encounter (Signed)
Patient has an appointment scheduled for tomorrow.

## 2020-04-16 ENCOUNTER — Other Ambulatory Visit: Payer: Self-pay

## 2020-04-16 ENCOUNTER — Telehealth (INDEPENDENT_AMBULATORY_CARE_PROVIDER_SITE_OTHER): Payer: Medicare Other | Admitting: Medical

## 2020-04-16 ENCOUNTER — Encounter: Payer: Self-pay | Admitting: Medical

## 2020-04-16 VITALS — BP 143/87 | Ht 72.5 in | Wt 242.0 lb

## 2020-04-16 DIAGNOSIS — E785 Hyperlipidemia, unspecified: Secondary | ICD-10-CM

## 2020-04-16 DIAGNOSIS — Z85528 Personal history of other malignant neoplasm of kidney: Secondary | ICD-10-CM

## 2020-04-16 DIAGNOSIS — R102 Pelvic and perineal pain: Secondary | ICD-10-CM | POA: Insufficient documentation

## 2020-04-16 DIAGNOSIS — E1121 Type 2 diabetes mellitus with diabetic nephropathy: Secondary | ICD-10-CM | POA: Diagnosis not present

## 2020-04-16 DIAGNOSIS — R109 Unspecified abdominal pain: Secondary | ICD-10-CM | POA: Diagnosis not present

## 2020-04-16 DIAGNOSIS — R93811 Abnormal radiologic findings on diagnostic imaging of right testicle: Secondary | ICD-10-CM

## 2020-04-16 DIAGNOSIS — Z9119 Patient's noncompliance with other medical treatment and regimen: Secondary | ICD-10-CM

## 2020-04-16 DIAGNOSIS — E1142 Type 2 diabetes mellitus with diabetic polyneuropathy: Secondary | ICD-10-CM

## 2020-04-16 DIAGNOSIS — N50812 Left testicular pain: Secondary | ICD-10-CM | POA: Diagnosis not present

## 2020-04-16 DIAGNOSIS — N1831 Chronic kidney disease, stage 3a: Secondary | ICD-10-CM

## 2020-04-16 DIAGNOSIS — Z794 Long term (current) use of insulin: Secondary | ICD-10-CM

## 2020-04-16 DIAGNOSIS — N183 Chronic kidney disease, stage 3 unspecified: Secondary | ICD-10-CM

## 2020-04-16 DIAGNOSIS — E1169 Type 2 diabetes mellitus with other specified complication: Secondary | ICD-10-CM

## 2020-04-16 DIAGNOSIS — R4589 Other symptoms and signs involving emotional state: Secondary | ICD-10-CM | POA: Insufficient documentation

## 2020-04-16 DIAGNOSIS — I6930 Unspecified sequelae of cerebral infarction: Secondary | ICD-10-CM

## 2020-04-16 DIAGNOSIS — Z91199 Patient's noncompliance with other medical treatment and regimen due to unspecified reason: Secondary | ICD-10-CM

## 2020-04-16 DIAGNOSIS — E0865 Diabetes mellitus due to underlying condition with hyperglycemia: Secondary | ICD-10-CM

## 2020-04-16 DIAGNOSIS — F419 Anxiety disorder, unspecified: Secondary | ICD-10-CM | POA: Insufficient documentation

## 2020-04-16 DIAGNOSIS — G40909 Epilepsy, unspecified, not intractable, without status epilepticus: Secondary | ICD-10-CM

## 2020-04-16 DIAGNOSIS — N1832 Chronic kidney disease, stage 3b: Secondary | ICD-10-CM

## 2020-04-16 DIAGNOSIS — I129 Hypertensive chronic kidney disease with stage 1 through stage 4 chronic kidney disease, or unspecified chronic kidney disease: Secondary | ICD-10-CM | POA: Diagnosis not present

## 2020-04-16 DIAGNOSIS — D649 Anemia, unspecified: Secondary | ICD-10-CM

## 2020-04-16 MED ORDER — HYDROCODONE-ACETAMINOPHEN 5-325 MG PO TABS: 1.0000 | ORAL_TABLET | Freq: Two times a day (BID) | ORAL | 0 refills | Status: DC | PRN

## 2020-04-16 NOTE — Progress Notes (Signed)
Patient has already been referred to [facility] in March. Does he need to be referred to a new one?

## 2020-04-16 NOTE — Progress Notes (Signed)
Subjective:     Patient ID: Brandon [name], male   DOB: 20-Apr-1961, 59 y.o.   MRN: [id number]  This visit type was conducted due to national recommendations for restrictions regarding the COVID-19 Pandemic (e.g. social distancing) in an effort to limit this patient's exposure and mitigate transmission in our community.  Due to their co-morbid illnesses, this patient is at least at moderate risk for complications without adequate follow up.  This format is felt to be most appropriate for this patient at this time.    Documentation for virtual audio and video telecommunications through Zoom encounter:  The patient was located at home. The provider was located in the office. The patient did consent to this visit and is aware of possible charges through their insurance for this visit.  The other persons participating in this telemedicine service were Barbette Or, girlfriend.  Time spent on call was 20 minutes and in review of previous records 20 minutes total.  This virtual service is not related to other E/M service within previous 7 days.   HPI Chief Complaint  Patient presents with  . Follow-up     Virtual consult today for hospital follow-up.  He was admitted Apr 05, 2020 through Apr 07, 2020 to [facility] hospital.    Currently having a lot of pain in left testicle.  At hospital he had high sugars.  This morning his sugar was 108.   Had heart evaluation at hospital.   Had swab for covid, negative.  Was told to follow up with urology in 6-8 weeks.   Has social worker helping him.  The plan is to get him set up for physical therapy and to have nurse do home eval to assess his situation.  He reports he needs something to help with pain if PT comes.  He doesn't want to be left in pain after therapy sessions.     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  He went to the  hospital for testicle and abdominal pain.  Was initially started on Levaquin for orchitis.  CT stone was negative, but scrotal ultrasound showed minimally complex right hydrocele and unremarkable left testicle.   Urology evaluated him, and no clear etiology of pain was found.  Was started on Flomax to help symptom.   Was given rolling walker, and advised home health PT with f/u with urology outpatient.   Principal Problem:   Acute renal failure superimposed on stage 3a chronic kidney disease (HCC) Active Problems:   Depression, recurrent ([place])   Dyslipidemia associated with type 2 diabetes mellitus ([place])   Hypertension associated with diabetes ([place])   History of stroke   Seizure disorder ([place])   Type 2 diabetes mellitus with stage 3a chronic kidney disease, with long-term current use of insulin ([place])   Abnormal radiologic findings on diagnostic imaging of right testicle   Diabetes Using 60 units nightly, ozempic weekly, novolog meal time insulin 15u TID.   He notes compliance with other medications  No other c/o  Past Medical History:  Diagnosis Date  . Acute ischemic stroke ([place]) 11/2013  . Allergy   . Anxiety   . Arthritis   . Benign hypertension with CKD (chronic kidney disease) stage III   . 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 CVA (cerebral vascular accident) ([place])    stroke  .  Depression    BH Adm in [place] Depression  . Diabetes mellitus    DKA prior hospitalization  . Diverticulitis    s/p micorperforation Sept 2012-managed conservatively by Gen surgery  . ED (erectile dysfunction)   . Focal seizure ([place]) 11/2013   due to ischemic stroke  . GERD (gastroesophageal reflux disease)   . Hiatal hernia   . Hyperlipidemia   . Hypertension   . Kidney tumor 09/2011   Renal cell CA  . Seizures ([place])    none since 2016, taking Keppra - maw  . Sleep apnea   . Thyroid disease    "weak thyroid" per MD  . Wears glasses    Past  Surgical History:  Procedure Laterality Date  . COLONOSCOPY    . IR RADIOLOGIST EVAL & MGMT  04/04/2017  . KIDNEY SURGERY     ablation of renal cell CA - 12/28, prior one was October 2012-Dr. Kathlene Cote  . TEE WITHOUT CARDIOVERSION N/A 11/19/2013   Procedure: TRANSESOPHAGEAL ECHOCARDIOGRAM (TEE);  Surgeon: Lelon Perla, MD;  Location: [facility] ENDOSCOPY;  Service: Cardiovascular;  Laterality: N/A;     Review of Systems As in subjective    Objective:   Physical Exam Due to coronavirus pandemic stay at home measures, patient visit was virtual and they were not examined in person.   BP (!) 143/87   Ht 6' 0.5" (1.842 m)   Wt 242 lb (109.8 kg)   BMI 32.37 kg/m    CT renal stone study 04/05/2020  IMPRESSION: 1. No acute intra-abdominal or pelvic pathology. 2. Sigmoid diverticulosis. No bowel obstruction. Normal appendix. 3. Aortic Atherosclerosis (ICD10-I70.0). 4. No significant interval change in the cryoablation areas in the bilateral kidneys.    Ultrasound scrotum 04/05/2020  IMPRESSION: 1. Heterogeneous right testicle as described above may be related to an inflammatory/infectious etiology. Clinical correlation and follow-up recommended. There is a small minimally complex right hydrocele. 2. Unremarkable left testicle.    PSA 0.51 on 04/07/2020  CBC showing anemia, 04/21/2020, low RBC,Hgb and HCT.  Reviewed other labs from hospitalization      Assessment:     Encounter Diagnoses  Name Primary?  . Testicular pain, left Yes  . Pelvic pain   . Side pain   . Benign hypertension with CKD (chronic kidney disease) stage III   . Diabetes mellitus due to underlying condition, uncontrolled, with hyperglycemia ([place])   . Type 2 diabetes mellitus with stage 3a chronic kidney disease, with long-term current use of insulin ([place])   . Type 2 diabetes mellitus with diabetic polyneuropathy, with long-term current use of insulin ([place])   . Dyslipidemia associated with type 2 diabetes  mellitus ([place])   . Seizure disorder ([place])   . Stage 3b chronic kidney disease   . History of cerebrovascular accident (CVA) with residual deficit   . History of renal cell cancer   . Noncompliance   . Abnormal radiologic findings on diagnostic imaging of right testicle   . Anemia, unspecified type   . Anxiety   . Depressed mood        Plan:     Reviewed hospitalization records, discharge summer, CT stone study, scrotal ultrasound, labs, medications reconciled.  Pelvic, testicular and side pain - etiology unclear.  Consider lumbar spine abnormality such as a radicular issue if no obvious source from pelvic or prostate or testicular issue.  He had an MRI of the lumbar spine 2016 that was abnormal  He will f/u with urology in about 6 weeks  to recheck on scrotal pain, complicated hydrocole for testicular and flank pain  Pain in general, can use short term norco below.  Discussed risks and benefits of medicaiton.    Referral for home PT   History of stroke, hx/o noncompliance: Referral for nurse eval/assessment in home setting  New devices this hospitalization, plain walker, new glucometer.      Diabetes - not at goal, noncompliance prior.  C/t current medications reviewed.  F/u with endocrinology in June as planned, Dr. Kelton Pillar   History of anxiety, mood depressed - he has not followed up with mental health provider in the past as advised. We will try again to refer to counseling   Anemia -I reviewed his 2018 colonoscopy report showing diverticula internal hemorrhoids but no obvious bleed.  Likely anemia of chronic disease.  We can plan an iron level the next time he is in for labs  Plan to see him back on 2 to 3 weeks, plan iron level at that time, repeat CMET at that time, consider MRI lumbar spine further evaluation if still having the same pains  Brandon [name] was seen today for follow-up.  Diagnoses and all orders for this visit:  Testicular pain, left  Pelvic  pain  Side pain  Benign hypertension with CKD (chronic kidney disease) stage III  Diabetes mellitus due to underlying condition, uncontrolled, with hyperglycemia (HCC)  Type 2 diabetes mellitus with stage 3a chronic kidney disease, with long-term current use of insulin (HCC)  Type 2 diabetes mellitus with diabetic polyneuropathy, with long-term current use of insulin (HCC)  Dyslipidemia associated with type 2 diabetes mellitus ([place])  Seizure disorder (HCC)  Stage 3b chronic kidney disease  History of cerebrovascular accident (CVA) with residual deficit  History of renal cell cancer  Noncompliance  Abnormal radiologic findings on diagnostic imaging of right testicle  Anemia, unspecified type  Anxiety  Depressed mood  Other orders -     HYDROcodone-acetaminophen (NORCO) 5-325 MG tablet; Take 1 tablet by mouth 2 (two) times daily as needed.

## 2020-04-16 NOTE — Progress Notes (Signed)
Form has been faxed.

## 2020-04-16 NOTE — Progress Notes (Signed)
Patient has asked for a call back on Monday to schdule his appointment because he don't drive and will need to know what day he will have a ride.

## 2020-04-19 ENCOUNTER — Other Ambulatory Visit: Payer: Self-pay | Admitting: Cardiology

## 2020-04-21 ENCOUNTER — Other Ambulatory Visit: Payer: Self-pay

## 2020-04-21 ENCOUNTER — Telehealth: Payer: Self-pay

## 2020-04-21 ENCOUNTER — Ambulatory Visit: Payer: Medicare Other | Attending: Internal Medicine

## 2020-04-21 DIAGNOSIS — Z23 Encounter for immunization: Secondary | ICD-10-CM

## 2020-04-21 NOTE — Progress Notes (Signed)
Lmom for patient to call and schedule an appointment.

## 2020-04-21 NOTE — Telephone Encounter (Signed)
It appears psychiatry tried to reach out to patient yesterday and they were unable to leave a message.

## 2020-04-21 NOTE — Progress Notes (Signed)
   Covid-19 Vaccination Clinic  Name:  Brandon [name]    MRN: [id number] DOB: [birth year]  04/21/2020  [name] was observed post Covid-19 immunization for 15 minutes without incident. He was provided with Vaccine Information Sheet and instruction to access the V-Safe system.   [name] was instructed to call 911 with any severe reactions post vaccine: [name] Difficulty breathing  . Swelling of face and throat  . A fast heartbeat  . A bad rash all over body  . Dizziness and weakness   Immunizations Administered    Name Date Dose VIS Date Route   Pfizer COVID-19 Vaccine 04/21/2020  8:41 AM 0.3 mL 01/29/2019 Intramuscular   Manufacturer: [place]   Lot: [id number]   [place]: [id number]

## 2020-04-27 ENCOUNTER — Other Ambulatory Visit: Payer: Self-pay | Admitting: Medical

## 2020-05-12 ENCOUNTER — Other Ambulatory Visit: Payer: Self-pay | Admitting: Medical

## 2020-05-12 ENCOUNTER — Ambulatory Visit: Payer: Medicare Other | Admitting: Medical

## 2020-05-13 NOTE — Progress Notes (Signed)
Name: Brandon [name]  Age/ Sex: 59 y.o., male   MRN/ DOB: [id number], [birth year]     PCP: Caryl Ada   Reason for Endocrinology Evaluation: Type 2 Diabetes Mellitus  Initial Endocrine Consultative Visit: 02/06/2020    PATIENT IDENTIFIER: Brandon [name] is a 59 y.o. male with a past medical history of T2DM, Dyslipidemia, Seizure d/o , RCC S/P cryoablation. The patient has followed with Endocrinology clinic since 02/06/2020 for consultative assistance with management of his diabetes.  DIABETIC HISTORY:  Brandon [name] was diagnosed with T2DM in 2012, he has been on insulin since his diagnosis, he was on januvia at some point  As well a metformin . His hemoglobin A1c has ranged from 7.1% in 2015, peaking at 11.7% in 2021.  On his initial presentation to our clinic, he had an A1c of 11.7% . He was already on Lantus, Ozempic as well as novolog. Pt was not taking his medications consistently.    SUBJECTIVE:   During the last visit (02/06/2020): A1c 11.7 %. Due to non-compliance. We adjusted his MDI regimen and continued Ozempic.   Today (05/14/2020): Brandon [name] is here for a follow up on diabetes management.  He is accompanied by [name].  He checks his blood sugars 2 times daily.  He did not bring his meter today.  The patient has had hypoglycemic episodes since the last clinic visit, which typically occur 1 x / week- most often occuring during the day . The patient is symptomatic with these episodes, with symptoms of shaking . Recent admission for AKI       HOME DIABETES REGIMEN:  Lantus 60 units daily  Novolog 15 units TIDQAC- has been out for  ~1 weeks  Ozempic 1 mg weekly ( Monday)      METER DOWNLOAD SUMMARY: Did not bring   DIABETIC COMPLICATIONS: Microvascular complications:   Neuropathy, CKD III  Denies: retinopathy  Last Eye Exam: Completed 2019  Macrovascular complications:   CVA  Denies: CAD,PVD    HISTORY:  Past Medical History:  Past  Medical History:  Diagnosis Date  . Acute ischemic stroke ([place]) 11/2013  . Allergy   . Anxiety   . Arthritis   . Benign hypertension with CKD (chronic kidney disease) stage III   . 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 CVA (cerebral vascular accident) ([place])    stroke  . Depression    BH Adm in [place] Depression  . Diabetes mellitus    DKA prior hospitalization  . Diverticulitis    s/p micorperforation Sept 2012-managed conservatively by Gen surgery  . ED (erectile dysfunction)   . Focal seizure ([place]) 11/2013   due to ischemic stroke  . GERD (gastroesophageal reflux disease)   . Hiatal hernia   . Hyperlipidemia   . Hypertension   . Kidney tumor 09/2011   Renal cell CA  . Seizures ([place])    none since 2016, taking Keppra - maw  . Sleep apnea   . Thyroid disease    "weak thyroid" per MD  . Wears glasses    Past Surgical History:  Past Surgical History:  Procedure Laterality Date  . COLONOSCOPY    . IR RADIOLOGIST EVAL & MGMT  04/04/2017  . KIDNEY SURGERY     ablation of renal cell CA - 12/28, prior one was October 2012-Dr. Kathlene Cote  . TEE WITHOUT CARDIOVERSION N/A 11/19/2013   Procedure: TRANSESOPHAGEAL ECHOCARDIOGRAM (TEE);  Surgeon: Aaron Edelman  Jacalyn Lefevre, MD;  Location: [facility];  Service: Cardiovascular;  Laterality: N/A;    Social History:  reports that he has never smoked. He has never used smokeless tobacco. He reports previous alcohol use. He reports that he does not use drugs. Family History:  Family History  Problem Relation Age of Onset  . Diabetes Father   . Heart disease Father   . Pneumonia Mother   . Prostate cancer Other   . Stomach cancer Other   . Breast cancer Sister   . Colon cancer Neg Hx   . Esophageal cancer Neg Hx   . Rectal cancer Neg Hx      HOME MEDICATIONS: Allergies as of 05/14/2020      Reactions   Morphine Itching      Medication List       Accurate as of May 14, 2020 11:32 AM. If you  have any questions, ask your nurse or doctor.        ACCU-CHEK ACTIVE STRIPS test strip Generic drug: glucose blood Use as instructed   glucose blood test strip accu chek active 1-2 times daily   accu-chek soft touch lancets Use as instructed   amLODipine 5 MG tablet Commonly known as: NORVASC Take 5 mg by mouth daily.   aspirin EC 81 MG tablet Take 1 tablet (81 mg total) by mouth daily.   atorvastatin 40 MG tablet Commonly known as: LIPITOR Take 1 tablet (40 mg total) by mouth daily.   blood glucose meter kit and supplies Dispense based on patient and insurance preference. Use up to four times daily as directed. (FOR ICD-9 250.00, 250.01).   ezetimibe 10 MG tablet Commonly known as: ZETIA Take 1 tablet (10 mg total) by mouth daily.   hydrochlorothiazide 12.5 MG capsule Commonly known as: MICROZIDE TAKE 1 CAPSULE BY MOUTH EVERY DAY IN THE MORNING   HYDROcodone-acetaminophen 5-325 MG tablet Commonly known as: Norco Take 1 tablet by mouth 2 (two) times daily as needed.   Lantus SoloStar 100 UNIT/ML Solostar Pen Generic drug: insulin glargine Inject 60 Units into the skin daily. What changed: how much to take   levETIRAcetam 500 MG tablet Commonly known as: Keppra Take 2 tablets (1,000 mg total) by mouth daily. What changed: how much to take   NovoLOG FlexPen 100 UNIT/ML FlexPen Generic drug: insulin aspart Inject 15 Units into the skin 3 (three) times daily with meals.   omeprazole 40 MG capsule Commonly known as: PRILOSEC TAKE 1 CAPSULE BY MOUTH EVERY DAY   ondansetron 4 MG tablet Commonly known as: Zofran Take 1 tablet (4 mg total) by mouth daily as needed for nausea or vomiting.   Ozempic (1 MG/DOSE) 2 MG/1.5ML Sopn Generic drug: Semaglutide (1 MG/DOSE) INJECT 2 MG INTO THE SKIN ONCE A WEEK.   Pen Needles 32G X 4 MM Misc 1 each by Does not apply route daily. Use for insulin pens   QUEtiapine 25 MG tablet Commonly known as: SEROQUEL Take 1 tablet  (25 mg total) by mouth daily.   sertraline 100 MG tablet Commonly known as: ZOLOFT Take 1 tablet (100 mg total) by mouth daily.   tamsulosin 0.4 MG Caps capsule Commonly known as: FLOMAX Take 1 capsule (0.4 mg total) by mouth daily.        OBJECTIVE:   Vital Signs: BP (!) 148/88 (BP Location: Left Arm, Patient Position: Sitting, Cuff Size: Large)   Pulse 78   Ht '6\' 1"'  (1.854 m)   Wt 273 lb 9.6 oz (  124.1 kg)   SpO2 99%   BMI 36.10 kg/m   Wt Readings from Last 3 Encounters:  05/14/20 273 lb 9.6 oz (124.1 kg)  04/16/20 242 lb (109.8 kg)  04/05/20 230 lb (104.3 kg)     Exam: General: Pt appears well and is in NAD  Lungs: Clear with good BS bilat with no rales, rhonchi, or wheezes  Heart: RRR with normal S1 and S2 and no gallops; no murmurs; no rub  Extremities:  1+ pretibial edema.   Neuro: MS is good with appropriate affect, pt is alert and Ox3    DM foot exam: 05/14/2020  The skin of the feet is without sores or ulcerations.  Toenails are discolored and thickened, bilateral plantar callus formation has been noted. The pedal pulses are undetectable due to edema The sensation is absent to a screening 5.07, 10 gram monofilament bilaterally            DATA REVIEWED:  Lab Results  Component Value Date   HGBA1C 14.2 (H) 04/05/2020   HGBA1C 11.7 (H) 01/09/2020   HGBA1C 8.6 (H) 07/03/2019   Lab Results  Component Value Date   MICROALBUR 0.4 04/23/2015   LDLCALC 107 (H) 01/09/2020   CREATININE 1.63 (H) 04/07/2020   Lab Results  Component Value Date   MICRALBCREAT 1,498 (H) 01/09/2020     Lab Results  Component Value Date   CHOL 162 01/09/2020   HDL 40 01/09/2020   LDLCALC 107 (H) 01/09/2020   TRIG 79 01/09/2020   CHOLHDL 4.1 01/09/2020         ASSESSMENT / PLAN / RECOMMENDATIONS:   1) Type 2 Diabetes Mellitus, Poorly controlled, With CKD III, neuropathic and macrovascular complications - Most recent A1c of 14.2 %. Goal A1c < 7.0 %.     -Unfortunately the patient forgot to bring his meter today, but he assures me that he has been very motivated at home and has been checking multiple times daily. -His major barriers to diabetes self-care is memory issues. -In the office today his BG was 33 mg/DL, patient did feel shaky.  He did indicate that he took Lantus 16 units this morning but he did not take any prandial insulin nor Ozempic.  Ms. [name] believes that he may had an extra dose of Lantus yesterday but the patient denies.  There is no way of telling at this time. -He did run out of NovoLog approximately a week ago per patient. -Given his hypoglycemia I am going to adjust his insulin as below -Patient was provided with diabetic shoe prescription -Patient continues with dietary indiscretion, for example last night he ate ice cream and donuts, but he has cut out sodas.  I have congratulated him on stopping the sodas altogether, but I have counseled him on gradually reducing donuts and ice cream.  MEDICATIONS: - Decrease Lantus to 48 units daily  - Decrease Novolog to 12 units with each meal  - Continue Ozempic 1 mg weekly      EDUCATION / INSTRUCTIONS:  BG monitoring instructions: Patient is instructed to check his blood sugars 3 times a day, before meals.  Call [facility] if: BG persistently < 70 . [name] I reviewed the Rule of 15 for the treatment of hypoglycemia in detail with the patient. Literature supplied.   2) Diabetic complications:   Eye: Does not have known diabetic retinopathy.   Neuro/ Feet: Does  have known diabetic peripheral neuropathy .   Renal: Patient does  have known baseline  CKD.   F/U in 3 months  I spent 30 minutes preparing to see the patient by review of recent labs, imaging and procedures, obtaining and reviewing separately obtained history, communicating with the patient/family or caregiver, ordering medications, correcting hypoglycemia through the rule of 15, and  documenting clinical information in the EHR including the differential Dx, treatment, and any further evaluation and other management    Signed electronically by: Mack Guise, MD  [facility]  [facility] [address] [place], [place] [address] Phone: [phone] FAX: [phone]   CC: Carlena Hurl, PA-C [place] [address] Phone: [phone]  Fax: [phone]  Return to Endocrinology clinic as below: No future appointments.

## 2020-05-14 ENCOUNTER — Encounter: Payer: Self-pay | Admitting: Internal Medicine

## 2020-05-14 ENCOUNTER — Ambulatory Visit (INDEPENDENT_AMBULATORY_CARE_PROVIDER_SITE_OTHER): Payer: Medicare Other | Admitting: Internal Medicine

## 2020-05-14 ENCOUNTER — Other Ambulatory Visit: Payer: Self-pay | Admitting: Medical

## 2020-05-14 ENCOUNTER — Other Ambulatory Visit: Payer: Self-pay

## 2020-05-14 ENCOUNTER — Telehealth: Payer: Self-pay | Admitting: Medical

## 2020-05-14 VITALS — BP 148/88 | HR 78 | Ht 73.0 in | Wt 273.6 lb

## 2020-05-14 DIAGNOSIS — E1142 Type 2 diabetes mellitus with diabetic polyneuropathy: Secondary | ICD-10-CM

## 2020-05-14 DIAGNOSIS — E0865 Diabetes mellitus due to underlying condition with hyperglycemia: Secondary | ICD-10-CM

## 2020-05-14 DIAGNOSIS — E1165 Type 2 diabetes mellitus with hyperglycemia: Secondary | ICD-10-CM

## 2020-05-14 DIAGNOSIS — Z794 Long term (current) use of insulin: Secondary | ICD-10-CM | POA: Diagnosis not present

## 2020-05-14 LAB — GLUCOSE, POCT (MANUAL RESULT ENTRY)
POC Glucose: 33 mg/dl — AB (ref 70–99)
POC Glucose: 41 mg/dl — AB (ref 70–99)
POC Glucose: 60 mg/dl — AB (ref 70–99)
POC Glucose: 67 mg/dl — AB (ref 70–99)

## 2020-05-14 MED ORDER — HYDROCODONE-ACETAMINOPHEN 5-325 MG PO TABS: 1.0000 | ORAL_TABLET | Freq: Two times a day (BID) | ORAL | 0 refills | Status: DC | PRN

## 2020-05-14 MED ORDER — LEVETIRACETAM 500 MG PO TABS: 1000.0000 mg | ORAL_TABLET | Freq: Every day | ORAL | 0 refills | Status: DC

## 2020-05-14 MED ORDER — NOVOLOG FLEXPEN 100 UNIT/ML ~~LOC~~ SOPN: 12.0000 [IU] | PEN_INJECTOR | Freq: Three times a day (TID) | SUBCUTANEOUS | 3 refills | Status: DC

## 2020-05-14 MED ORDER — OZEMPIC (1 MG/DOSE) 2 MG/1.5ML ~~LOC~~ SOPN: 1.0000 mg | PEN_INJECTOR | SUBCUTANEOUS | 3 refills | Status: DC

## 2020-05-14 MED ORDER — LANTUS SOLOSTAR 100 UNIT/ML ~~LOC~~ SOPN: 48.0000 [IU] | PEN_INJECTOR | Freq: Every day | SUBCUTANEOUS | 3 refills | Status: DC

## 2020-05-14 MED ORDER — UNABLE TO FIND: 0 refills | Status: DC

## 2020-05-14 NOTE — Patient Instructions (Addendum)
-   Decrease Lantus to 48 units daily  - Decrease Novolog to 12 units with each meal  - Continue Ozempic 1 mg weekly       HOW TO TREAT LOW BLOOD SUGARS (Blood sugar LESS THAN 70 MG/DL)  Please follow the RULE OF 15 for the treatment of hypoglycemia treatment (when your (blood sugars are less than 70 mg/dL)    STEP 1: Take 15 grams of carbohydrates when your blood sugar is low, which includes:   3-4 GLUCOSE TABS  OR  3-4 OZ OF JUICE OR REGULAR SODA OR  ONE TUBE OF GLUCOSE GEL     STEP 2: RECHECK blood sugar in 15 MINUTES STEP 3: If your blood sugar is still low at the 15 minute recheck --> then, go back to STEP 1 and treat AGAIN with another 15 grams of carbohydrates.

## 2020-05-14 NOTE — Telephone Encounter (Signed)
Schedule med check.  I refilled Keppra and pain medicine but I cannot continue pain medicine ongoing as it is controlled substance.  Keppra should be prescribed through neurology.  I refilled the medicine requested by pharmacy but it looks like the date suggest that maybe he has been out of this?

## 2020-05-14 NOTE — Telephone Encounter (Signed)
Brandon [name] called and stated that pt needs refills of Keppra and hydrocodone. Please send to [facility]. Pt can be reached at [phone].

## 2020-05-18 NOTE — Telephone Encounter (Signed)
Unable to reach lisa by phone.

## 2020-05-19 NOTE — Telephone Encounter (Signed)
Left detail message on machine from provider.

## 2020-05-26 ENCOUNTER — Other Ambulatory Visit: Payer: Self-pay | Admitting: Medical

## 2020-06-05 ENCOUNTER — Other Ambulatory Visit: Payer: Self-pay | Admitting: Medical

## 2020-06-05 NOTE — Telephone Encounter (Signed)
Is this okay to refill? 

## 2020-06-20 ENCOUNTER — Other Ambulatory Visit: Payer: Self-pay | Admitting: Medical

## 2020-06-25 ENCOUNTER — Ambulatory Visit: Payer: Medicare Other | Admitting: Medical

## 2020-06-26 ENCOUNTER — Other Ambulatory Visit: Payer: Self-pay | Admitting: Medical

## 2020-07-15 ENCOUNTER — Emergency Department (HOSPITAL_COMMUNITY): Payer: Medicare Other

## 2020-07-15 ENCOUNTER — Emergency Department (HOSPITAL_COMMUNITY)
Admission: EM | Admit: 2020-07-15 | Discharge: 2020-07-15 | Disposition: A | Discharge status: Home / Self Care | Payer: Medicare Other | Attending: Emergency Medicine | Admitting: Emergency Medicine

## 2020-07-15 ENCOUNTER — Encounter (HOSPITAL_COMMUNITY): Payer: Self-pay | Admitting: Emergency Medicine

## 2020-07-15 ENCOUNTER — Other Ambulatory Visit: Payer: Self-pay

## 2020-07-15 DIAGNOSIS — R404 Transient alteration of awareness: Secondary | ICD-10-CM | POA: Diagnosis not present

## 2020-07-15 DIAGNOSIS — Z794 Long term (current) use of insulin: Secondary | ICD-10-CM | POA: Insufficient documentation

## 2020-07-15 DIAGNOSIS — J320 Chronic maxillary sinusitis: Secondary | ICD-10-CM | POA: Diagnosis not present

## 2020-07-15 DIAGNOSIS — G40909 Epilepsy, unspecified, not intractable, without status epilepticus: Secondary | ICD-10-CM | POA: Diagnosis not present

## 2020-07-15 DIAGNOSIS — E161 Other hypoglycemia: Secondary | ICD-10-CM | POA: Diagnosis not present

## 2020-07-15 DIAGNOSIS — E119 Type 2 diabetes mellitus without complications: Secondary | ICD-10-CM | POA: Diagnosis not present

## 2020-07-15 DIAGNOSIS — Z7982 Long term (current) use of aspirin: Secondary | ICD-10-CM | POA: Diagnosis not present

## 2020-07-15 DIAGNOSIS — Z79899 Other long term (current) drug therapy: Secondary | ICD-10-CM | POA: Insufficient documentation

## 2020-07-15 DIAGNOSIS — I7090 Unspecified atherosclerosis: Secondary | ICD-10-CM | POA: Diagnosis not present

## 2020-07-15 DIAGNOSIS — R569 Unspecified convulsions: Secondary | ICD-10-CM | POA: Diagnosis not present

## 2020-07-15 DIAGNOSIS — J0100 Acute maxillary sinusitis, unspecified: Secondary | ICD-10-CM | POA: Diagnosis not present

## 2020-07-15 DIAGNOSIS — E11649 Type 2 diabetes mellitus with hypoglycemia without coma: Secondary | ICD-10-CM | POA: Diagnosis not present

## 2020-07-15 DIAGNOSIS — I129 Hypertensive chronic kidney disease with stage 1 through stage 4 chronic kidney disease, or unspecified chronic kidney disease: Secondary | ICD-10-CM | POA: Diagnosis not present

## 2020-07-15 DIAGNOSIS — N1832 Chronic kidney disease, stage 3b: Secondary | ICD-10-CM | POA: Diagnosis not present

## 2020-07-15 DIAGNOSIS — R61 Generalized hyperhidrosis: Secondary | ICD-10-CM | POA: Diagnosis present

## 2020-07-15 DIAGNOSIS — E162 Hypoglycemia, unspecified: Secondary | ICD-10-CM | POA: Insufficient documentation

## 2020-07-15 DIAGNOSIS — G8190 Hemiplegia, unspecified affecting unspecified side: Secondary | ICD-10-CM | POA: Diagnosis not present

## 2020-07-15 LAB — BASIC METABOLIC PANEL
Anion gap: 11 (ref 5–15)
BUN: 28 mg/dL — ABNORMAL HIGH (ref 6–20)
CO2: 29 mmol/L (ref 22–32)
Calcium: 9.7 mg/dL (ref 8.9–10.3)
Chloride: 104 mmol/L (ref 98–111)
Creatinine, Ser: 1.43 mg/dL — ABNORMAL HIGH (ref 0.61–1.24)
GFR calc Af Amer: >60 mL/min (ref >60)
GFR calc non Af Amer: 54 mL/min — ABNORMAL LOW (ref >60)
Glucose, Bld: 142 mg/dL — ABNORMAL HIGH (ref 70–99)
Potassium: 4.3 mmol/L (ref 3.5–5.1)
Sodium: 144 mmol/L (ref 135–145)

## 2020-07-15 LAB — URINALYSIS, ROUTINE W REFLEX MICROSCOPIC
Bacteria, UA: NONE SEEN
Bilirubin Urine: NEGATIVE
Glucose, UA: NEGATIVE mg/dL
Hgb urine dipstick: NEGATIVE
Ketones, ur: NEGATIVE mg/dL
Leukocytes,Ua: NEGATIVE
Nitrite: NEGATIVE
Protein, ur: 100 mg/dL — AB
Specific Gravity, Urine: 1.014 (ref 1.005–1.030)
pH: 5 (ref 5.0–8.0)

## 2020-07-15 LAB — CBC WITH DIFFERENTIAL/PLATELET
Abs Immature Granulocytes: 0.05 10*3/uL (ref 0.00–0.07)
Basophils Absolute: 0 10*3/uL (ref 0.0–0.1)
Basophils Relative: 0 %
Eosinophils Absolute: 0 10*3/uL (ref 0.0–0.5)
Eosinophils Relative: 0 %
HCT: 39.4 % (ref 39.0–52.0)
Hemoglobin: 13.4 g/dL (ref 13.0–17.0)
Immature Granulocytes: 0 %
Lymphocytes Relative: 7 %
Lymphs Abs: 0.9 10*3/uL (ref 0.7–4.0)
MCH: 30.2 pg (ref 26.0–34.0)
MCHC: 34 g/dL (ref 30.0–36.0)
MCV: 88.7 fL (ref 80.0–100.0)
Monocytes Absolute: 0.4 10*3/uL (ref 0.1–1.0)
Monocytes Relative: 3 %
Neutro Abs: 12.5 10*3/uL — ABNORMAL HIGH (ref 1.7–7.7)
Neutrophils Relative %: 90 %
Platelets: 283 10*3/uL (ref 150–400)
RBC: 4.44 MIL/uL (ref 4.22–5.81)
RDW: 11.8 % (ref 11.5–15.5)
WBC: 13.8 10*3/uL — ABNORMAL HIGH (ref 4.0–10.5)
nRBC: 0 % (ref 0.0–0.2)

## 2020-07-15 LAB — CBG MONITORING, ED: Glucose-Capillary: 100 mg/dL — ABNORMAL HIGH (ref 70–99)

## 2020-07-15 MED ORDER — ENSURE ENLIVE PO LIQD
2.0000 | Freq: Once | ORAL | Status: AC
  Administered 2020-07-15: 474 mL via ORAL
  Filled 2020-07-15: qty 474

## 2020-07-15 MED ORDER — ACETAMINOPHEN 325 MG PO TABS
650.0000 mg | ORAL_TABLET | Freq: Once | ORAL | Status: AC
  Administered 2020-07-15: 650 mg via ORAL
  Filled 2020-07-15: qty 2

## 2020-07-15 NOTE — ED Notes (Signed)
Patient was given water and applesauce. Patient tolerating PO intake w/o any complaints or difficulties.

## 2020-07-15 NOTE — Discharge Instructions (Addendum)
You were seen in the emergency department after a seizure and low blood sugar event.  You had blood work that did not show any serious abnormalities and a CAT scan that did not show any new sign of stroke.  You were able to eat some food here in your symptoms are improved.  Please follow-up with your regular doctor.  Return to the emergency department if any worsening or concerning symptoms

## 2020-07-15 NOTE — ED Triage Notes (Signed)
Arrives via EMS from home, C/C hypoglycemia. Significant other saw patient having a seizure on the couch and was throwing up, EMS got CBG and it was 29, gave glucagon, helped with orientation, but still confused.  EMS also gave oral glucose, but he had multiple episodes of emesis/spitting. CBG recheck it was 98. Hx of stroke, and L sided deficits. Baseline A&Ox4.

## 2020-07-15 NOTE — ED Provider Notes (Signed)
[facility] DEPT Provider Note   CSN: [id number] Arrival date & time: 07/15/20  1005     History No chief complaint on file.   Brandon [name] is a 59 y.o. male.  He has a history of stroke leaving him with some left-sided deficits, seizure on Keppra, diabetes.  He said he felt hot last night and was sweating.  Patient's wife found him having a seizure on the couch and vomiting.  Initial blood sugar was 29 and was given glucagon with improvement in mental status.  Patient currently denies any significant complaints.  Gets frequent headaches.  Has pain in the left side of his body at times.  States he has had low blood sugars in the past.  The history is provided by the patient and the EMS personnel.  Seizures Seizure activity on arrival: no   Seizure type:  Unable to specify Initial focality:  Unable to specify Episode characteristics: confusion, generalized shaking and unresponsiveness   Postictal symptoms: confusion   Return to baseline: yes   Severity:  Moderate Timing:  Once Progression:  Resolved Context: possible hypoglycemia   Recent head injury:  No recent head injuries Meds prior to arrival: glucagon. History of seizures: yes        Past Medical History:  Diagnosis Date  . Acute ischemic stroke ([place]) 11/2013  . Allergy   . Anxiety   . Arthritis   . Benign hypertension with CKD (chronic kidney disease) stage III   . 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 CVA (cerebral vascular accident) ([place])    stroke  . Depression    BH Adm in [place] Depression  . Diabetes mellitus    DKA prior hospitalization  . Diverticulitis    s/p micorperforation Sept 2012-managed conservatively by Gen surgery  . ED (erectile dysfunction)   . Focal seizure ([place]) 11/2013   due to ischemic stroke  . GERD (gastroesophageal reflux disease)   . Hiatal hernia   . Hyperlipidemia   . Hypertension   . Kidney tumor  09/2011   Renal cell CA  . Seizures ([place])    none since 2016, taking Keppra - maw  . Sleep apnea   . Thyroid disease    "weak thyroid" per MD  . Wears glasses     Patient Active Problem List   Diagnosis Date Noted  . Testicular pain, left 04/16/2020  . Pelvic pain 04/16/2020  . Anemia 04/16/2020  . Anxiety 04/16/2020  . Depressed mood 04/16/2020  . Acute renal failure superimposed on stage 3a chronic kidney disease ([place]) 04/06/2020  . Abnormal radiologic findings on diagnostic imaging of right testicle 04/06/2020  . Type 2 diabetes mellitus with diabetic polyneuropathy, with long-term current use of insulin ([place]) 02/09/2020  . Type 2 diabetes mellitus with stage 3a chronic kidney disease, with long-term current use of insulin ([place]) 02/09/2020  . Family history of premature CAD 01/27/2020  . Stage 3b chronic kidney disease 01/09/2020  . Incontinence of feces 01/09/2020  . Chronic pain of left knee 01/09/2020  . Left leg pain 01/09/2020  . Fall 01/09/2020  . History of diverticulitis 10/30/2019  . Noncompliance 06/10/2019  . History of renal cell cancer 04/24/2018  . Erectile dysfunction 04/24/2018  . Edema 03/15/2018  . Seizure disorder ([place]) 03/01/2018  . History of stroke 11/29/2017  . Obstructive sleep apnea 11/29/2017  . Uncontrolled diabetes mellitus ([place]) 05/10/2017  . Dyslipidemia associated with type 2  diabetes mellitus ([place]) 05/10/2017  . Hypertension associated with diabetes ([place]) 05/10/2017  . History of cerebrovascular accident (CVA) with residual deficit 05/10/2017  . Diabetic ulcer of left foot associated with secondary diabetes mellitus ([place]) 04/21/2016  . History of seizure 01/05/2016  . Depression, recurrent ([place]) 09/21/2014  . Benign hypertension with CKD (chronic kidney disease) stage III 12/30/2011  . Side pain 12/30/2011    Past Surgical History:  Procedure Laterality Date  . COLONOSCOPY    . IR RADIOLOGIST EVAL & MGMT  04/04/2017  . KIDNEY SURGERY      ablation of renal cell CA - 12/28, prior one was October 2012-Dr. Kathlene Cote  . TEE WITHOUT CARDIOVERSION N/A 11/19/2013   Procedure: TRANSESOPHAGEAL ECHOCARDIOGRAM (TEE);  Surgeon: Lelon Perla, MD;  Location: [facility] END[facility];  Service: Cardiovascular;  Laterality: N/A;       Family History  Problem Relation Age of Onset  . Diabetes Father   . Heart disease Father   . Pneumonia Mother   . Prostate cancer Other   . Stomach cancer Other   . Breast cancer Sister   . Colon cancer Neg Hx   . Esophageal cancer Neg Hx   . Rectal cancer Neg Hx     Social History   Tobacco Use  . Smoking status: Never Smoker  . Smokeless tobacco: Never Used  Vaping Use  . Vaping Use: Never used  Substance Use Topics  . Alcohol use: Not Currently    Comment: rarely  . Drug use: No    Home Medications Prior to Admission medications   Medication Sig Start Date End Date Taking? Authorizing Provider  amLODipine (NORVASC) 5 MG tablet Take 5 mg by mouth daily. 01/17/20   [provider]  aspirin EC 81 MG tablet Take 1 tablet (81 mg total) by mouth daily. 01/09/20   Tysinger, Camelia Eng, PA-C  atorvastatin (LIPITOR) 40 MG tablet Take 1 tablet (40 mg total) by mouth daily. 07/05/19   Tysinger, Camelia Eng, PA-C  blood glucose meter kit and supplies Dispense based on patient and insurance preference. Use up to four times daily as directed. (FOR ICD-9 250.00, 250.01). 04/24/16   Eugenie Filler, MD  ezetimibe (ZETIA) 10 MG tablet Take 1 tablet (10 mg total) by mouth daily. 01/27/20 04/26/20  Terri Skains, Sunit, DO  glucose blood (ACCU-CHEK ACTIVE STRIPS) test strip Use as instructed 06/11/19   Tysinger, Camelia Eng, PA-C  glucose blood test strip accu chek active 1-2 times daily Patient not taking: Reported on 04/16/2020 01/17/20   Tysinger, Camelia Eng, PA-C  hydrochlorothiazide (MICROZIDE) 12.5 MG capsule TAKE 1 CAPSULE BY MOUTH EVERY DAY IN THE MORNING 04/20/20   Tolia, Sunit, DO  HYDROcodone-acetaminophen (NORCO) 5-325  MG tablet Take 1 tablet by mouth 2 (two) times daily as needed. 05/14/20   Tysinger, Camelia Eng, PA-C  insulin aspart (NOVOLOG FLEXPEN) 100 UNIT/ML FlexPen Inject 12 Units into the skin 3 (three) times daily with meals. 05/14/20   Shamleffer, Melanie Crazier, MD  insulin glargine (LANTUS SOLOSTAR) 100 UNIT/ML Solostar Pen Inject 48 Units into the skin daily. 05/14/20   Shamleffer, Melanie Crazier, MD  Insulin Pen Needle (PEN NEEDLES) 32G X 4 MM MISC 1 each by Does not apply route daily. Use for insulin pens 07/05/19   Tysinger, Camelia Eng, PA-C  Lancets (ACCU-CHEK SOFT TOUCH) lancets Use as instructed 06/11/19   Tysinger, Camelia Eng, PA-C  levETIRAcetam (KEPPRA) 500 MG tablet TAKE 2 TABLETS (1,000 MG TOTAL) BY MOUTH DAILY. 06/22/20   Tysinger,  Camelia Eng, PA-C  omeprazole (PRILOSEC) 40 MG capsule TAKE 1 CAPSULE BY MOUTH EVERY DAY 06/26/20   Tysinger, Camelia Eng, PA-C  ondansetron (ZOFRAN) 4 MG tablet Take 1 tablet (4 mg total) by mouth daily as needed for nausea or vomiting. 01/09/20 01/08/21  Tysinger, Camelia Eng, PA-C  QUEtiapine (SEROQUEL) 25 MG tablet Take 1 tablet (25 mg total) by mouth daily. 07/05/19   Tysinger, Camelia Eng, PA-C  Semaglutide, 1 MG/DOSE, (OZEMPIC, 1 MG/DOSE,) 2 MG/1.5ML SOPN Inject 0.75 mLs (1 mg total) into the skin once a week. 05/14/20   Shamleffer, Melanie Crazier, MD  sertraline (ZOLOFT) 100 MG tablet Take 1 tablet (100 mg total) by mouth daily. 07/05/19   Tysinger, Camelia Eng, PA-C  tamsulosin (FLOMAX) 0.4 MG CAPS capsule Take 1 capsule (0.4 mg total) by mouth daily. 04/07/20   Ceasar Mons, MD  UNABLE TO FIND Med Name:  Custom diabetic shoes  Dx: P10.31 05/14/20   Shamleffer, Melanie Crazier, MD    Allergies    Morphine  Review of Systems   Review of Systems  Constitutional: Negative for fever.  HENT: Negative for sore throat.   Eyes: Negative for visual disturbance.  Respiratory: Negative for shortness of breath.   Cardiovascular: Negative for chest pain.  Gastrointestinal: Negative for  abdominal pain.  Genitourinary: Negative for dysuria.  Musculoskeletal: Positive for gait problem.  Skin: Negative for rash.  Neurological: Positive for seizures.    Physical Exam Updated Vital Signs BP (!) 195/105   Pulse (!) 102   Temp 97.9 F (36.6 C) (Oral)   Resp 14   Ht 6' 1" (1.854 m)   Wt 122.5 kg   SpO2 100%   BMI 35.62 kg/m   Physical Exam Vitals and nursing note reviewed.  Constitutional:      Appearance: Normal appearance. He is well-developed.  HENT:     Head: Normocephalic and atraumatic.  Eyes:     Conjunctiva/sclera: Conjunctivae normal.  Cardiovascular:     Rate and Rhythm: Normal rate and regular rhythm.     Heart sounds: No murmur heard.   Pulmonary:     Effort: Pulmonary effort is normal. No respiratory distress.     Breath sounds: Normal breath sounds.  Abdominal:     Palpations: Abdomen is soft.     Tenderness: There is no abdominal tenderness.  Musculoskeletal:        General: No deformity.     Cervical back: Neck supple.  Skin:    General: Skin is warm and dry.     Capillary Refill: Capillary refill takes less than 2 seconds.  Neurological:     Mental Status: He is alert. Mental status is at baseline.     ED Results / Procedures / Treatments   Labs (all labs ordered are listed, but only abnormal results are displayed) Labs Reviewed  CBG MONITORING, ED - Abnormal; Notable for the following components:      Result Value   Glucose-Capillary 100 (*)    All other components within normal limits  BASIC METABOLIC PANEL  CBC WITH DIFFERENTIAL/PLATELET  URINALYSIS, ROUTINE W REFLEX MICROSCOPIC    EKG None  Radiology No results found.  Procedures Procedures (including critical care time)  Medications Ordered in ED Medications - No data to display  ED Course  I have reviewed the triage vital signs and the nursing notes.  Pertinent labs & imaging results that were available during my care of the patient were reviewed by me and  considered in my  medical decision making (see chart for details).  Clinical Course as of Jul 15 1914  Wed Jul 15, 2020  1403 Patient has eaten some here and had a couple of ensures.  He feels comfortable going home.   [MB]    Clinical Course User Index [MB] Hayden Rasmussen, MD   MDM Rules/Calculators/A&P                         This patient complains of seizure, hypoglycemia; this involves an extensive number of treatment Options and is a complaint that carries with it a high risk of complications and Morbidity. The differential includes seizure, hypoglycemia, sepsis, bleed, metabolic derangement, infection  I ordered, reviewed and interpreted labs, which included CBC with slightly elevated white count, normal hemoglobin, chemistries with slightly elevated glucose, elevated BUN and creatinine better than baseline, urinalysis without signs of infection I ordered medication Tylenol for headache I ordered imaging studies which included CT head and I independently    visualized and interpreted imaging which showed no acute findings, stable MCA stroke Additional history obtained from patient's wife and caregiver Previous records obtained and reviewed in epic, no recent admissions  After the interventions stated above, I reevaluated the patient and found patient to be awake alert and without significant complaints.  He is eaten in the department.  He feels comfortable going home.  Return instructions discussed   Final Clinical Impression(s) / ED Diagnoses Final diagnoses:  Hypoglycemia  Seizure [facility])    Rx / DC Orders ED Discharge Orders    None       Hayden Rasmussen, MD 07/15/20 [phone]

## 2020-07-16 ENCOUNTER — Telehealth: Payer: Self-pay

## 2020-07-16 NOTE — Telephone Encounter (Signed)
Pt. Walked into the office stating that he just got out of the hospital recently and he was going to schedule a f/u with [name] next week. He also said his kidney was hurting him pretty bad and if he could get something called in for the pain please. Whatever we could do for him he would really appreciate it.

## 2020-07-16 NOTE — Telephone Encounter (Signed)
Under the present circumstances, the only pain relief would be 2 Tylenol 4 times per day.

## 2020-07-17 NOTE — Telephone Encounter (Signed)
Pt. Aware of recommendations and will call back once he figures out his rides schedule to get scheduled with [name] for a hospital f/u.

## 2020-07-22 ENCOUNTER — Other Ambulatory Visit: Payer: Self-pay | Admitting: Medical

## 2020-07-29 ENCOUNTER — Other Ambulatory Visit: Payer: Self-pay | Admitting: Medical

## 2020-08-19 ENCOUNTER — Telehealth: Payer: Self-pay

## 2020-08-19 NOTE — Telephone Encounter (Signed)
Pt. Called stating he wanted to know if you could do another referral for him to psyc he had one but missed the apt.

## 2020-08-20 ENCOUNTER — Other Ambulatory Visit: Payer: Self-pay

## 2020-08-20 DIAGNOSIS — F339 Major depressive disorder, recurrent, unspecified: Secondary | ICD-10-CM

## 2020-08-20 NOTE — Telephone Encounter (Signed)
Referral has been sent.

## 2020-08-24 ENCOUNTER — Telehealth: Payer: Self-pay | Admitting: Medical

## 2020-08-24 NOTE — Telephone Encounter (Signed)
Although I am glad he is willing to see counseling, I am not exactly sure what his telephone call was referencing.  When was the last time he saw a counselor, why is he not willing to see that one now?    I recommend one of these:  [facility]  [phone]  [facility] [phone]  Dr. George Hugh Torrington [phone] [place], [place] [address]   [facility] [address], [place], [place] [address] [phone]

## 2020-08-24 NOTE — Telephone Encounter (Signed)
Lmom informing patient he has been referred again. At this point he is waiting to be contacted to make an appointment. Asked that patient call back should he need assistance with something else.

## 2020-08-24 NOTE — Telephone Encounter (Signed)
Pt called and wants you to set him up to see Another counselor. He states that you would know what he is talking about

## 2020-09-18 ENCOUNTER — Other Ambulatory Visit: Payer: Self-pay

## 2020-09-18 ENCOUNTER — Encounter: Payer: Self-pay | Admitting: Medical

## 2020-09-18 ENCOUNTER — Ambulatory Visit (INDEPENDENT_AMBULATORY_CARE_PROVIDER_SITE_OTHER): Payer: Medicare Other | Admitting: Medical

## 2020-09-18 VITALS — BP 130/86 | HR 85 | Ht 73.0 in | Wt 255.6 lb

## 2020-09-18 DIAGNOSIS — I152 Hypertension secondary to endocrine disorders: Secondary | ICD-10-CM | POA: Diagnosis not present

## 2020-09-18 DIAGNOSIS — E1122 Type 2 diabetes mellitus with diabetic chronic kidney disease: Secondary | ICD-10-CM

## 2020-09-18 DIAGNOSIS — Z79899 Other long term (current) drug therapy: Secondary | ICD-10-CM

## 2020-09-18 DIAGNOSIS — R109 Unspecified abdominal pain: Secondary | ICD-10-CM | POA: Diagnosis not present

## 2020-09-18 DIAGNOSIS — N50811 Right testicular pain: Secondary | ICD-10-CM | POA: Diagnosis not present

## 2020-09-18 DIAGNOSIS — R609 Edema, unspecified: Secondary | ICD-10-CM | POA: Diagnosis not present

## 2020-09-18 DIAGNOSIS — E1159 Type 2 diabetes mellitus with other circulatory complications: Secondary | ICD-10-CM

## 2020-09-18 DIAGNOSIS — E785 Hyperlipidemia, unspecified: Secondary | ICD-10-CM

## 2020-09-18 DIAGNOSIS — G40909 Epilepsy, unspecified, not intractable, without status epilepticus: Secondary | ICD-10-CM

## 2020-09-18 DIAGNOSIS — E1169 Type 2 diabetes mellitus with other specified complication: Secondary | ICD-10-CM | POA: Diagnosis not present

## 2020-09-18 DIAGNOSIS — R112 Nausea with vomiting, unspecified: Secondary | ICD-10-CM | POA: Insufficient documentation

## 2020-09-18 DIAGNOSIS — E1142 Type 2 diabetes mellitus with diabetic polyneuropathy: Secondary | ICD-10-CM

## 2020-09-18 DIAGNOSIS — E162 Hypoglycemia, unspecified: Secondary | ICD-10-CM

## 2020-09-18 DIAGNOSIS — N1831 Chronic kidney disease, stage 3a: Secondary | ICD-10-CM

## 2020-09-18 DIAGNOSIS — Z794 Long term (current) use of insulin: Secondary | ICD-10-CM

## 2020-09-18 LAB — POCT URINALYSIS DIP (CLINITEK)
Bilirubin, UA: NEGATIVE
Blood, UA: NEGATIVE
Glucose, UA: >=1000 mg/dL — AB
Ketones, POC UA: NEGATIVE mg/dL
Leukocytes, UA: NEGATIVE
Nitrite, UA: NEGATIVE
POC PROTEIN,UA: 100 — AB
Spec Grav, UA: 1.015 (ref 1.010–1.025)
Urobilinogen, UA: 0.2 E.U./dL
pH, UA: 6 (ref 5.0–8.0)

## 2020-09-18 MED ORDER — ONDANSETRON HCL 4 MG PO TABS: 4.0000 mg | ORAL_TABLET | Freq: Three times a day (TID) | ORAL | 0 refills | Status: DC | PRN

## 2020-09-18 MED ORDER — HYDROCODONE-ACETAMINOPHEN 5-325 MG PO TABS
1.0000 | ORAL_TABLET | Freq: Four times a day (QID) | ORAL | 0 refills | Status: DC | PRN
Start: 2020-09-18 — End: 2021-07-10

## 2020-09-18 NOTE — Progress Notes (Signed)
Subjective: Chief Complaint  Patient presents with  . Follow-up    referral to psych   . Seizures    3 weeks ago   . Abdominal Pain   Here for hospital f/u.  He is here alone  3 weeks ago had a seizure in his sleep, ended up being taken to [facility] by EMS.  He notes his labs looked great, other than glucose being dangerously low.  He was released later that day from hospital.   He says his heart was checked out at the hospital and looked good.  Since then feels rough at times.  Over the last few weeks he is still having vomiting, abdominal upset, weakness, nausea.  Feels like urine flow is decreased.  Getting swelling in feet.  Feels food regurgitating.    He is not sure if he is currently taking Flomax.  He endorses that he is taking his medicines in general  For diabetes he says he is taking  Lantus 45u, but says he is taking this twice daily.  He is doing the NovoLog 12 units with meals and Ozempic weekly  He is pretty sure he is taking Prilosec but still having a lot of regurgitation or reflux  No chest pain, no shortness of breath  He does note swelling in his legs   Past Medical History:  Diagnosis Date  . Acute ischemic stroke ([place]) 11/2013  . Allergy   . Anxiety   . Arthritis   . Benign hypertension with CKD (chronic kidney disease) stage III ([place])   . 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 CVA (cerebral vascular accident) ([place])    stroke  . Depression    BH Adm in [place] Depression  . Diabetes mellitus    DKA prior hospitalization  . Diverticulitis    s/p micorperforation Sept 2012-managed conservatively by Gen surgery  . ED (erectile dysfunction)   . Focal seizure ([place]) 11/2013   due to ischemic stroke  . GERD (gastroesophageal reflux disease)   . Hiatal hernia   . Hyperlipidemia   . Hypertension   . Kidney tumor 09/2011   Renal cell CA  . Seizures ([place])    none since 2016, taking Keppra - maw  .  Sleep apnea   . Thyroid disease    "weak thyroid" per MD  . Wears glasses    ROS as in subjective   Objective: BP 130/86   Pulse 85   Ht 6\' 1"  (1.854 m)   Wt 255 lb 9.6 oz (115.9 kg)   SpO2 96%   BMI 33.72 kg/m   Wt Readings from Last 3 Encounters:  09/18/20 255 lb 9.6 oz (115.9 kg)  07/15/20 270 lb (122.5 kg)  05/14/20 273 lb 9.6 oz (124.1 kg)   General well-developed well-nourished no acute distress Heart regular rhythm, no murmurs  lungs clear Abdomen: Positive bowel sounds, soft, seems to be tender throughout, somewhat guarded but no distention, no rigidity Back nontender No appreciable swelling today on exam Upper and lower pulses within normal limits GU: Normal male, somewhat tender right testicle otherwise no testicular mass, no swelling no lymphadenopathy Neck supple, no lymphadenopathy, no mass no thyromegaly Psych: Pleasant, answers questions appropriate,    Assessment: Encounter Diagnoses  Name Primary?  . Nausea and vomiting, intractability of vomiting not specified, unspecified vomiting type Yes  . Abdominal discomfort   . Hypoglycemia   . Polypharmacy   . Hypertension associated with  diabetes ([place])   . Dyslipidemia associated with type 2 diabetes mellitus ([place])   . Type 2 diabetes mellitus with diabetic polyneuropathy, with long-term current use of insulin ([place])   . Type 2 diabetes mellitus with stage 3a chronic kidney disease, with long-term current use of insulin ([place])   . Seizure disorder ([place])   . Edema, unspecified type   . Testicular pain, right      Plan: I reviewed his recent July 15, 2020 emergency department visit.  He was seen for severe hypoglycemia, seizure, I reviewed his labs, CT head which showed no CT evidence of intracranial abnormality.  There was finding of a chronic right MCA territory infarct and para nasal sinus disease.  Diabetes I reviewed his May 14, 2020 diabetes note with Dr. Kelton Pillar.  At that visit he was found  to have a blood sugar of 33.  She adjusted his Lantus down to 48 units daily, NovoLog 12 units per meal and continued on Ozempic 1 mg weekly.  She counseled him on diet.  He does not have known diabetic retinopathy.  Does have known diabetic peripheral neuropathy and baseline CKD   After discussing his symptoms and concerns, I suspect the Ozempic is giving him a lot of his nausea and vomiting.  He also appears to be taking Lantus twice a day although he recently had 2 visits both endocrinology and emergency department regarding hypoglycemia.  It is unclear at times with his memory whether he is getting confused and not taking his medicines exactly as listed.  When I asked him again the Lantus he said he has been taking it once daily last few days, but had been taking it twice daily at least last week.  He seems to be taking the NovoLog 12 units with meals  When reviewing his other medications he thinks he is taking all of them regularly  I reviewed his CT renal study back in May 2021 showing sigmoid diverticulosis, normal appendix, aortic atherosclerosis, no interval change in cryoablation of the bilateral kidneys and no other obvious abnormality  I reviewed the May 2021 ultrasound of scrotum showing small right hydrocele, unremarkable left testicle, but heterogeneous right testicle which could be infectious or inflammatory.   Given his ongoing testicular pain, I recommend referral to urology  I recommend he stop Ozempic today and follow-up with Dr. Kelton Pillar ASAP.  I advise he take his Lantus 48 units once daily and continue NovoLog 12 units with meals.  Continue glucose monitoring.  I will send a message to his endocrinologist about the same  Labs today as below.  He can use Zofran for nausea in the meantime   Dravin was seen today for follow-up, seizures and abdominal pain.  Diagnoses and all orders for this visit:  Nausea and vomiting, intractability of vomiting not specified,  unspecified vomiting type -     Basic metabolic panel -     CBC with Differential/Platelet -     Lipase -     POCT URINALYSIS DIP (CLINITEK) -     SLP modified barium swallow; Future  Abdominal discomfort -     Basic metabolic panel -     CBC with Differential/Platelet -     Lipase -     POCT URINALYSIS DIP (CLINITEK) -     SLP modified barium swallow; Future  Hypoglycemia  Polypharmacy  Hypertension associated with diabetes ([place])  Dyslipidemia associated with type 2 diabetes mellitus ([place])  Type 2 diabetes mellitus with diabetic polyneuropathy, with  long-term current use of insulin ([place])  Type 2 diabetes mellitus with stage 3a chronic kidney disease, with long-term current use of insulin (H[facility])  Seizure disorder (H[facility])  Edema, unspecified type  Testicular pain, right -     Ambulatory referral to Urology  Other orders -     ondansetron (ZOFRAN) 4 MG tablet; Take 1 tablet (4 mg total) by mouth every 8 (eight) hours as needed for nausea or vomiting. -     HYDROcodone-acetaminophen (NORCO) 5-325 MG tablet; Take 1 tablet by mouth every 6 (six) hours as needed.  f/u pending lab results.

## 2020-09-18 NOTE — Patient Instructions (Addendum)
Recommendations:  Diabetes  Stop Ozempic for now  Take the Lantus only once daily 40 units once daily  Continue NovoLog 12 units with meals  Check your sugars regularly  We are referring you to the urologist about your testicle pain   I am going to schedule you for a swallow study given the swallow concerns or regurgitation   Begin Zofran as needed for nausea 1 tablet every 4-6 hours   You can use a few days of the pain medicine for pain   Go ahead and use your Prilosec omeprazole twice daily instead of once daily   I will have Dr. Leonette Monarch call you soon

## 2020-09-19 LAB — BASIC METABOLIC PANEL
BUN/Creatinine Ratio: 14 (ref 9–20)
BUN: 26 mg/dL — ABNORMAL HIGH (ref 6–24)
CO2: 25 mmol/L (ref 20–29)
Calcium: 9.7 mg/dL (ref 8.7–10.2)
Chloride: 99 mmol/L (ref 96–106)
Creatinine, Ser: 1.91 mg/dL — ABNORMAL HIGH (ref 0.76–1.27)
GFR calc Af Amer: 44 mL/min/{1.73_m2} — ABNORMAL LOW (ref >59)
GFR calc non Af Amer: 38 mL/min/{1.73_m2} — ABNORMAL LOW (ref >59)
Glucose: 477 mg/dL — ABNORMAL HIGH (ref 65–99)
Potassium: 5.1 mmol/L (ref 3.5–5.2)
Sodium: 137 mmol/L (ref 134–144)

## 2020-09-19 LAB — CBC WITH DIFFERENTIAL/PLATELET
Basophils Absolute: 0 10*3/uL (ref 0.0–0.2)
Basos: 0 %
EOS (ABSOLUTE): 0.1 10*3/uL (ref 0.0–0.4)
Eos: 2 %
Hematocrit: 34.5 % — ABNORMAL LOW (ref 37.5–51.0)
Hemoglobin: 11.5 g/dL — ABNORMAL LOW (ref 13.0–17.7)
Immature Grans (Abs): 0 10*3/uL (ref 0.0–0.1)
Immature Granulocytes: 0 %
Lymphocytes Absolute: 1.8 10*3/uL (ref 0.7–3.1)
Lymphs: 36 %
MCH: 30.2 pg (ref 26.6–33.0)
MCHC: 33.3 g/dL (ref 31.5–35.7)
MCV: 91 fL (ref 79–97)
Monocytes Absolute: 0.3 10*3/uL (ref 0.1–0.9)
Monocytes: 6 %
Neutrophils Absolute: 2.7 10*3/uL (ref 1.4–7.0)
Neutrophils: 56 %
Platelets: 209 10*3/uL (ref 150–450)
RBC: 3.81 x10E6/uL — ABNORMAL LOW (ref 4.14–5.80)
RDW: 11.4 % — ABNORMAL LOW (ref 11.6–15.4)
WBC: 4.9 10*3/uL (ref 3.4–10.8)

## 2020-09-19 LAB — LIPASE: Lipase: 48 U/L (ref 13–78)

## 2020-09-21 ENCOUNTER — Other Ambulatory Visit: Payer: Self-pay | Admitting: Medical

## 2020-09-21 MED ORDER — HYDROCODONE-ACETAMINOPHEN 7.5-325 MG PO TABS: 1.0000 | ORAL_TABLET | ORAL | 0 refills | Status: DC | PRN

## 2020-09-21 MED ORDER — ONDANSETRON HCL 4 MG PO TABS: 4.0000 mg | ORAL_TABLET | Freq: Three times a day (TID) | ORAL | 0 refills | Status: DC | PRN

## 2020-09-21 NOTE — Progress Notes (Signed)
Done

## 2020-09-21 NOTE — Addendum Note (Signed)
Addended by: Carlena Hurl on: 09/21/2020 01:40 PM   Modules accepted: Orders

## 2020-09-25 ENCOUNTER — Other Ambulatory Visit (HOSPITAL_COMMUNITY): Payer: Self-pay

## 2020-09-25 DIAGNOSIS — R1310 Dysphagia, unspecified: Secondary | ICD-10-CM

## 2020-09-25 DIAGNOSIS — R059 Cough, unspecified: Secondary | ICD-10-CM

## 2020-10-08 ENCOUNTER — Ambulatory Visit (HOSPITAL_COMMUNITY): Payer: Medicare Other | Attending: Medical

## 2020-10-08 ENCOUNTER — Inpatient Hospital Stay (HOSPITAL_COMMUNITY): Admission: RE | Admit: 2020-10-08 | Payer: Medicare Other | Source: Ambulatory Visit

## 2020-10-15 DIAGNOSIS — R809 Proteinuria, unspecified: Secondary | ICD-10-CM | POA: Diagnosis not present

## 2020-10-15 DIAGNOSIS — E1122 Type 2 diabetes mellitus with diabetic chronic kidney disease: Secondary | ICD-10-CM | POA: Diagnosis not present

## 2020-10-15 DIAGNOSIS — N1832 Chronic kidney disease, stage 3b: Secondary | ICD-10-CM | POA: Diagnosis not present

## 2020-10-15 DIAGNOSIS — Z85528 Personal history of other malignant neoplasm of kidney: Secondary | ICD-10-CM | POA: Diagnosis not present

## 2020-10-15 DIAGNOSIS — N400 Enlarged prostate without lower urinary tract symptoms: Secondary | ICD-10-CM | POA: Diagnosis not present

## 2020-10-15 DIAGNOSIS — I129 Hypertensive chronic kidney disease with stage 1 through stage 4 chronic kidney disease, or unspecified chronic kidney disease: Secondary | ICD-10-CM | POA: Diagnosis not present

## 2020-10-22 ENCOUNTER — Ambulatory Visit: Payer: Medicare Other | Admitting: Medical

## 2020-10-22 ENCOUNTER — Other Ambulatory Visit: Payer: Self-pay

## 2020-10-22 ENCOUNTER — Encounter (HOSPITAL_COMMUNITY): Payer: Medicare Other

## 2020-10-22 ENCOUNTER — Ambulatory Visit (HOSPITAL_COMMUNITY)
Admission: RE | Admit: 2020-10-22 | Discharge: 2020-10-22 | Disposition: A | Discharge status: Home / Self Care | Payer: Medicare Other | Source: Ambulatory Visit | Attending: Medical | Admitting: Medical

## 2020-10-22 DIAGNOSIS — R112 Nausea with vomiting, unspecified: Secondary | ICD-10-CM

## 2020-10-22 DIAGNOSIS — R109 Unspecified abdominal pain: Secondary | ICD-10-CM

## 2020-10-27 ENCOUNTER — Other Ambulatory Visit: Payer: Self-pay | Admitting: Medical

## 2020-11-21 ENCOUNTER — Other Ambulatory Visit: Payer: Self-pay | Admitting: Cardiology

## 2020-12-23 ENCOUNTER — Telehealth: Payer: Self-pay | Admitting: Medical

## 2020-12-23 ENCOUNTER — Other Ambulatory Visit: Payer: Self-pay | Admitting: Medical

## 2020-12-23 NOTE — Telephone Encounter (Signed)
Looking back, I don't see a recent script.  When is last time he used this?   What dose 50 or 100mg ?  Any recent chest pain, difficulty breathing or other significant fatigue?

## 2020-12-23 NOTE — Telephone Encounter (Signed)
Don't see on current medication list.

## 2020-12-23 NOTE — Telephone Encounter (Signed)
He needs rx for viagra  [facility]

## 2020-12-23 NOTE — Telephone Encounter (Signed)
Pls send refill

## 2020-12-24 ENCOUNTER — Other Ambulatory Visit: Payer: Self-pay

## 2020-12-24 ENCOUNTER — Ambulatory Visit (INDEPENDENT_AMBULATORY_CARE_PROVIDER_SITE_OTHER): Payer: Medicare Other

## 2020-12-24 DIAGNOSIS — Z23 Encounter for immunization: Secondary | ICD-10-CM

## 2020-12-24 NOTE — Telephone Encounter (Signed)
Lmom for patient to call back about medication

## 2020-12-27 ENCOUNTER — Other Ambulatory Visit: Payer: Self-pay | Admitting: Medical

## 2020-12-28 ENCOUNTER — Other Ambulatory Visit: Payer: Self-pay | Admitting: Medical

## 2020-12-28 MED ORDER — SILDENAFIL CITRATE 50 MG PO TABS: 50.0000 mg | ORAL_TABLET | ORAL | 0 refills | Status: DC | PRN

## 2020-12-28 NOTE — Telephone Encounter (Signed)
I sent the 50 mg to try, no more than 1 tablet in a 24-hour period.  You cannot take this with nitroglycerin or other nitrate medications due to potential fatal reaction.  Do not take this if dehydrated or feeling lightheaded or dizzy

## 2020-12-28 NOTE — Telephone Encounter (Signed)
Patient stated he was given samples about 2 years ago. He was taking 50 mg.  Pt denies chest pain and difficulty breathing. There is no fatigue noted.

## 2020-12-28 NOTE — Telephone Encounter (Signed)
Left detail message on machine for patient. 

## 2021-01-25 ENCOUNTER — Other Ambulatory Visit: Payer: Self-pay | Admitting: Medical

## 2021-01-25 ENCOUNTER — Encounter: Payer: Self-pay | Admitting: Medical

## 2021-01-26 ENCOUNTER — Other Ambulatory Visit: Payer: Self-pay | Admitting: Cardiology

## 2021-02-08 ENCOUNTER — Other Ambulatory Visit: Payer: Self-pay | Admitting: Medical

## 2021-04-07 ENCOUNTER — Ambulatory Visit: Payer: Medicare Other | Admitting: Medical

## 2021-04-16 DIAGNOSIS — I161 Hypertensive emergency: Secondary | ICD-10-CM | POA: Diagnosis not present

## 2021-04-16 DIAGNOSIS — E1165 Type 2 diabetes mellitus with hyperglycemia: Secondary | ICD-10-CM | POA: Diagnosis not present

## 2021-04-16 DIAGNOSIS — R404 Transient alteration of awareness: Secondary | ICD-10-CM | POA: Diagnosis not present

## 2021-04-16 DIAGNOSIS — E1100 Type 2 diabetes mellitus with hyperosmolarity without nonketotic hyperglycemic-hyperosmolar coma (NKHHC): Secondary | ICD-10-CM | POA: Diagnosis not present

## 2021-04-16 DIAGNOSIS — G459 Transient cerebral ischemic attack, unspecified: Secondary | ICD-10-CM | POA: Diagnosis not present

## 2021-04-16 DIAGNOSIS — R58 Hemorrhage, not elsewhere classified: Secondary | ICD-10-CM | POA: Diagnosis not present

## 2021-04-16 DIAGNOSIS — G9340 Encephalopathy, unspecified: Secondary | ICD-10-CM | POA: Diagnosis not present

## 2021-04-16 DIAGNOSIS — R4182 Altered mental status, unspecified: Secondary | ICD-10-CM | POA: Diagnosis not present

## 2021-04-16 DIAGNOSIS — R2990 Unspecified symptoms and signs involving the nervous system: Secondary | ICD-10-CM | POA: Diagnosis not present

## 2021-04-17 DIAGNOSIS — G9340 Encephalopathy, unspecified: Secondary | ICD-10-CM | POA: Diagnosis not present

## 2021-04-17 DIAGNOSIS — I10 Essential (primary) hypertension: Secondary | ICD-10-CM | POA: Diagnosis not present

## 2021-04-17 DIAGNOSIS — E1165 Type 2 diabetes mellitus with hyperglycemia: Secondary | ICD-10-CM | POA: Diagnosis not present

## 2021-04-17 DIAGNOSIS — I639 Cerebral infarction, unspecified: Secondary | ICD-10-CM | POA: Diagnosis not present

## 2021-04-17 DIAGNOSIS — R2990 Unspecified symptoms and signs involving the nervous system: Secondary | ICD-10-CM | POA: Diagnosis not present

## 2021-04-17 DIAGNOSIS — G9341 Metabolic encephalopathy: Secondary | ICD-10-CM | POA: Diagnosis not present

## 2021-04-17 DIAGNOSIS — R4182 Altered mental status, unspecified: Secondary | ICD-10-CM | POA: Diagnosis not present

## 2021-04-17 DIAGNOSIS — E1100 Type 2 diabetes mellitus with hyperosmolarity without nonketotic hyperglycemic-hyperosmolar coma (NKHHC): Secondary | ICD-10-CM | POA: Diagnosis not present

## 2021-04-17 DIAGNOSIS — R569 Unspecified convulsions: Secondary | ICD-10-CM | POA: Diagnosis not present

## 2021-04-18 DIAGNOSIS — E1165 Type 2 diabetes mellitus with hyperglycemia: Secondary | ICD-10-CM | POA: Diagnosis not present

## 2021-04-18 DIAGNOSIS — G9340 Encephalopathy, unspecified: Secondary | ICD-10-CM | POA: Diagnosis not present

## 2021-04-18 DIAGNOSIS — R2990 Unspecified symptoms and signs involving the nervous system: Secondary | ICD-10-CM | POA: Diagnosis not present

## 2021-04-18 DIAGNOSIS — E1100 Type 2 diabetes mellitus with hyperosmolarity without nonketotic hyperglycemic-hyperosmolar coma (NKHHC): Secondary | ICD-10-CM | POA: Diagnosis not present

## 2021-04-19 DIAGNOSIS — I69398 Other sequelae of cerebral infarction: Secondary | ICD-10-CM | POA: Diagnosis not present

## 2021-04-19 DIAGNOSIS — G9389 Other specified disorders of brain: Secondary | ICD-10-CM | POA: Diagnosis not present

## 2021-04-24 DIAGNOSIS — M6281 Muscle weakness (generalized): Secondary | ICD-10-CM | POA: Insufficient documentation

## 2021-04-24 DIAGNOSIS — G9341 Metabolic encephalopathy: Secondary | ICD-10-CM | POA: Diagnosis not present

## 2021-04-24 DIAGNOSIS — E118 Type 2 diabetes mellitus with unspecified complications: Secondary | ICD-10-CM | POA: Diagnosis not present

## 2021-04-24 DIAGNOSIS — R2681 Unsteadiness on feet: Secondary | ICD-10-CM | POA: Diagnosis not present

## 2021-04-24 DIAGNOSIS — M545 Low back pain, unspecified: Secondary | ICD-10-CM | POA: Diagnosis not present

## 2021-04-24 DIAGNOSIS — N400 Enlarged prostate without lower urinary tract symptoms: Secondary | ICD-10-CM | POA: Insufficient documentation

## 2021-04-24 DIAGNOSIS — Z9181 History of falling: Secondary | ICD-10-CM | POA: Diagnosis not present

## 2021-05-12 ENCOUNTER — Other Ambulatory Visit: Payer: Self-pay | Admitting: Medical

## 2021-05-18 ENCOUNTER — Other Ambulatory Visit: Payer: Self-pay

## 2021-05-18 ENCOUNTER — Ambulatory Visit: Payer: Medicare Other | Admitting: Medical

## 2021-06-28 ENCOUNTER — Other Ambulatory Visit: Payer: Self-pay | Admitting: Medical

## 2021-06-29 ENCOUNTER — Telehealth: Payer: Self-pay | Admitting: Medical

## 2021-06-29 NOTE — Telephone Encounter (Signed)
I received a refill request on one of his medicines, Keppra   Please call patient Looking back in the chart record I am concerned.  It appears that he has not seen the diabetes specialist in a year  I have not seen him since October  He really should be coming in every 4 months for med check or chronic disease management  Please schedule him, send 30-day supply of medication  Please make referral to [facility] for care management social worker

## 2021-07-04 ENCOUNTER — Inpatient Hospital Stay (HOSPITAL_COMMUNITY): Payer: Medicare Other

## 2021-07-04 ENCOUNTER — Emergency Department (HOSPITAL_COMMUNITY): Payer: Medicare Other

## 2021-07-04 ENCOUNTER — Other Ambulatory Visit: Payer: Self-pay

## 2021-07-04 ENCOUNTER — Inpatient Hospital Stay (HOSPITAL_COMMUNITY)
Admission: EM | Admit: 2021-07-04 | Discharge: 2021-07-10 | DRG: 637 | Disposition: A | Discharge status: Home Health | Payer: Medicare Other | Attending: Internal Medicine | Admitting: Internal Medicine

## 2021-07-04 DIAGNOSIS — I129 Hypertensive chronic kidney disease with stage 1 through stage 4 chronic kidney disease, or unspecified chronic kidney disease: Secondary | ICD-10-CM | POA: Diagnosis present

## 2021-07-04 DIAGNOSIS — Z833 Family history of diabetes mellitus: Secondary | ICD-10-CM | POA: Diagnosis not present

## 2021-07-04 DIAGNOSIS — F209 Schizophrenia, unspecified: Secondary | ICD-10-CM | POA: Diagnosis present

## 2021-07-04 DIAGNOSIS — Z800 Family history of malignant neoplasm of digestive organs: Secondary | ICD-10-CM

## 2021-07-04 DIAGNOSIS — E1100 Type 2 diabetes mellitus with hyperosmolarity without nonketotic hyperglycemic-hyperosmolar coma (NKHHC): Principal | ICD-10-CM | POA: Diagnosis present

## 2021-07-04 DIAGNOSIS — I69398 Other sequelae of cerebral infarction: Secondary | ICD-10-CM | POA: Diagnosis not present

## 2021-07-04 DIAGNOSIS — Z794 Long term (current) use of insulin: Secondary | ICD-10-CM | POA: Diagnosis not present

## 2021-07-04 DIAGNOSIS — I63311 Cerebral infarction due to thrombosis of right middle cerebral artery: Secondary | ICD-10-CM | POA: Diagnosis not present

## 2021-07-04 DIAGNOSIS — Z8249 Family history of ischemic heart disease and other diseases of the circulatory system: Secondary | ICD-10-CM | POA: Diagnosis not present

## 2021-07-04 DIAGNOSIS — G40909 Epilepsy, unspecified, not intractable, without status epilepticus: Secondary | ICD-10-CM | POA: Diagnosis present

## 2021-07-04 DIAGNOSIS — I639 Cerebral infarction, unspecified: Secondary | ICD-10-CM | POA: Diagnosis not present

## 2021-07-04 DIAGNOSIS — E785 Hyperlipidemia, unspecified: Secondary | ICD-10-CM | POA: Diagnosis present

## 2021-07-04 DIAGNOSIS — K219 Gastro-esophageal reflux disease without esophagitis: Secondary | ICD-10-CM | POA: Diagnosis present

## 2021-07-04 DIAGNOSIS — G9340 Encephalopathy, unspecified: Secondary | ICD-10-CM | POA: Diagnosis not present

## 2021-07-04 DIAGNOSIS — Z8042 Family history of malignant neoplasm of prostate: Secondary | ICD-10-CM | POA: Diagnosis not present

## 2021-07-04 DIAGNOSIS — N1832 Chronic kidney disease, stage 3b: Secondary | ICD-10-CM | POA: Diagnosis present

## 2021-07-04 DIAGNOSIS — Z9114 Patient's other noncompliance with medication regimen: Secondary | ICD-10-CM | POA: Diagnosis not present

## 2021-07-04 DIAGNOSIS — Z79899 Other long term (current) drug therapy: Secondary | ICD-10-CM | POA: Diagnosis not present

## 2021-07-04 DIAGNOSIS — Z781 Physical restraint status: Secondary | ICD-10-CM

## 2021-07-04 DIAGNOSIS — N183 Chronic kidney disease, stage 3 unspecified: Secondary | ICD-10-CM | POA: Diagnosis not present

## 2021-07-04 DIAGNOSIS — R4701 Aphasia: Secondary | ICD-10-CM | POA: Diagnosis present

## 2021-07-04 DIAGNOSIS — R404 Transient alteration of awareness: Secondary | ICD-10-CM | POA: Diagnosis not present

## 2021-07-04 DIAGNOSIS — E1129 Type 2 diabetes mellitus with other diabetic kidney complication: Secondary | ICD-10-CM | POA: Diagnosis not present

## 2021-07-04 DIAGNOSIS — G9341 Metabolic encephalopathy: Secondary | ICD-10-CM | POA: Diagnosis present

## 2021-07-04 DIAGNOSIS — R739 Hyperglycemia, unspecified: Secondary | ICD-10-CM | POA: Diagnosis not present

## 2021-07-04 DIAGNOSIS — I674 Hypertensive encephalopathy: Secondary | ICD-10-CM | POA: Diagnosis present

## 2021-07-04 DIAGNOSIS — G9389 Other specified disorders of brain: Secondary | ICD-10-CM | POA: Diagnosis not present

## 2021-07-04 DIAGNOSIS — N2889 Other specified disorders of kidney and ureter: Secondary | ICD-10-CM | POA: Diagnosis not present

## 2021-07-04 DIAGNOSIS — N170 Acute kidney failure with tubular necrosis: Secondary | ICD-10-CM | POA: Diagnosis present

## 2021-07-04 DIAGNOSIS — F200 Paranoid schizophrenia: Secondary | ICD-10-CM | POA: Diagnosis not present

## 2021-07-04 DIAGNOSIS — I6523 Occlusion and stenosis of bilateral carotid arteries: Secondary | ICD-10-CM | POA: Diagnosis not present

## 2021-07-04 DIAGNOSIS — F32A Depression, unspecified: Secondary | ICD-10-CM | POA: Diagnosis present

## 2021-07-04 DIAGNOSIS — Z85528 Personal history of other malignant neoplasm of kidney: Secondary | ICD-10-CM

## 2021-07-04 DIAGNOSIS — R29818 Other symptoms and signs involving the nervous system: Secondary | ICD-10-CM | POA: Diagnosis not present

## 2021-07-04 DIAGNOSIS — I161 Hypertensive emergency: Secondary | ICD-10-CM | POA: Diagnosis present

## 2021-07-04 DIAGNOSIS — R4781 Slurred speech: Secondary | ICD-10-CM | POA: Diagnosis not present

## 2021-07-04 DIAGNOSIS — Z20822 Contact with and (suspected) exposure to covid-19: Secondary | ICD-10-CM | POA: Diagnosis present

## 2021-07-04 DIAGNOSIS — D649 Anemia, unspecified: Secondary | ICD-10-CM | POA: Diagnosis not present

## 2021-07-04 DIAGNOSIS — E876 Hypokalemia: Secondary | ICD-10-CM | POA: Diagnosis present

## 2021-07-04 DIAGNOSIS — F4310 Post-traumatic stress disorder, unspecified: Secondary | ICD-10-CM | POA: Diagnosis not present

## 2021-07-04 DIAGNOSIS — R5381 Other malaise: Secondary | ICD-10-CM | POA: Diagnosis present

## 2021-07-04 DIAGNOSIS — E1122 Type 2 diabetes mellitus with diabetic chronic kidney disease: Secondary | ICD-10-CM | POA: Diagnosis present

## 2021-07-04 DIAGNOSIS — R4182 Altered mental status, unspecified: Secondary | ICD-10-CM

## 2021-07-04 DIAGNOSIS — R410 Disorientation, unspecified: Secondary | ICD-10-CM | POA: Diagnosis not present

## 2021-07-04 DIAGNOSIS — I63411 Cerebral infarction due to embolism of right middle cerebral artery: Secondary | ICD-10-CM | POA: Diagnosis not present

## 2021-07-04 DIAGNOSIS — N179 Acute kidney failure, unspecified: Secondary | ICD-10-CM | POA: Diagnosis not present

## 2021-07-04 DIAGNOSIS — E1165 Type 2 diabetes mellitus with hyperglycemia: Secondary | ICD-10-CM | POA: Diagnosis not present

## 2021-07-04 DIAGNOSIS — N400 Enlarged prostate without lower urinary tract symptoms: Secondary | ICD-10-CM | POA: Diagnosis present

## 2021-07-04 DIAGNOSIS — E079 Disorder of thyroid, unspecified: Secondary | ICD-10-CM | POA: Diagnosis present

## 2021-07-04 DIAGNOSIS — I10 Essential (primary) hypertension: Secondary | ICD-10-CM | POA: Diagnosis not present

## 2021-07-04 LAB — CBC
HCT: 32.2 % — ABNORMAL LOW (ref 39.0–52.0)
HCT: 36.7 % — ABNORMAL LOW (ref 39.0–52.0)
Hemoglobin: 11.2 g/dL — ABNORMAL LOW (ref 13.0–17.0)
Hemoglobin: 12.6 g/dL — ABNORMAL LOW (ref 13.0–17.0)
MCH: 29.9 pg (ref 26.0–34.0)
MCH: 29.6 pg (ref 26.0–34.0)
MCHC: 34.8 g/dL (ref 30.0–36.0)
MCHC: 34.3 g/dL (ref 30.0–36.0)
MCV: 85.9 fL (ref 80.0–100.0)
MCV: 86.2 fL (ref 80.0–100.0)
Platelets: 215 10*3/uL (ref 150–400)
Platelets: 203 10*3/uL (ref 150–400)
RBC: 3.75 MIL/uL — ABNORMAL LOW (ref 4.22–5.81)
RBC: 4.26 MIL/uL (ref 4.22–5.81)
RDW: 11.4 % — ABNORMAL LOW (ref 11.5–15.5)
RDW: 11.4 % — ABNORMAL LOW (ref 11.5–15.5)
WBC: 5.9 10*3/uL (ref 4.0–10.5)
WBC: 8.9 10*3/uL (ref 4.0–10.5)
nRBC: 0 % (ref 0.0–0.2)
nRBC: 0 % (ref 0.0–0.2)

## 2021-07-04 LAB — APTT: aPTT: 32 seconds (ref 24–36)

## 2021-07-04 LAB — I-STAT CHEM 8, ED
BUN: 23 mg/dL — ABNORMAL HIGH (ref 6–20)
Calcium, Ion: 1.12 mmol/L — ABNORMAL LOW (ref 1.15–1.40)
Chloride: 99 mmol/L (ref 98–111)
Creatinine, Ser: 2.4 mg/dL — ABNORMAL HIGH (ref 0.61–1.24)
Glucose, Bld: >700 mg/dL (ref 70–99)
HCT: 34 % — ABNORMAL LOW (ref 39.0–52.0)
Hemoglobin: 11.6 g/dL — ABNORMAL LOW (ref 13.0–17.0)
Potassium: 3.9 mmol/L (ref 3.5–5.1)
Sodium: 133 mmol/L — ABNORMAL LOW (ref 135–145)
TCO2: 23 mmol/L (ref 22–32)

## 2021-07-04 LAB — DIFFERENTIAL
Abs Immature Granulocytes: 0.01 10*3/uL (ref 0.00–0.07)
Basophils Absolute: 0.1 10*3/uL (ref 0.0–0.1)
Basophils Relative: 1 %
Eosinophils Absolute: 0.1 10*3/uL (ref 0.0–0.5)
Eosinophils Relative: 2 %
Immature Granulocytes: 0 %
Lymphocytes Relative: 30 %
Lymphs Abs: 1.8 10*3/uL (ref 0.7–4.0)
Monocytes Absolute: 0.5 10*3/uL (ref 0.1–1.0)
Monocytes Relative: 8 %
Neutro Abs: 3.5 10*3/uL (ref 1.7–7.7)
Neutrophils Relative %: 59 %

## 2021-07-04 LAB — COMPREHENSIVE METABOLIC PANEL
ALT: 15 U/L (ref 0–44)
AST: 13 U/L — ABNORMAL LOW (ref 15–41)
Albumin: 3.2 g/dL — ABNORMAL LOW (ref 3.5–5.0)
Alkaline Phosphatase: 158 U/L — ABNORMAL HIGH (ref 38–126)
Anion gap: 9 (ref 5–15)
BUN: 21 mg/dL — ABNORMAL HIGH (ref 6–20)
CO2: 23 mmol/L (ref 22–32)
Calcium: 8.7 mg/dL — ABNORMAL LOW (ref 8.9–10.3)
Chloride: 98 mmol/L (ref 98–111)
Creatinine, Ser: 2.51 mg/dL — ABNORMAL HIGH (ref 0.61–1.24)
GFR, Estimated: 29 mL/min — ABNORMAL LOW (ref >60)
Glucose, Bld: 800 mg/dL (ref 70–99)
Potassium: 3.8 mmol/L (ref 3.5–5.1)
Sodium: 130 mmol/L — ABNORMAL LOW (ref 135–145)
Total Bilirubin: 1 mg/dL (ref 0.3–1.2)
Total Protein: 6.7 g/dL (ref 6.5–8.1)

## 2021-07-04 LAB — BASIC METABOLIC PANEL
Anion gap: 10 (ref 5–15)
Anion gap: 9 (ref 5–15)
BUN: 21 mg/dL — ABNORMAL HIGH (ref 6–20)
BUN: 18 mg/dL (ref 6–20)
CO2: 22 mmol/L (ref 22–32)
CO2: 22 mmol/L (ref 22–32)
Calcium: 8.9 mg/dL (ref 8.9–10.3)
Calcium: 9.2 mg/dL (ref 8.9–10.3)
Chloride: 100 mmol/L (ref 98–111)
Chloride: 105 mmol/L (ref 98–111)
Creatinine, Ser: 2.44 mg/dL — ABNORMAL HIGH (ref 0.61–1.24)
Creatinine, Ser: 2.19 mg/dL — ABNORMAL HIGH (ref 0.61–1.24)
GFR, Estimated: 30 mL/min — ABNORMAL LOW (ref >60)
GFR, Estimated: 34 mL/min — ABNORMAL LOW (ref >60)
Glucose, Bld: 773 mg/dL (ref 70–99)
Glucose, Bld: 322 mg/dL — ABNORMAL HIGH (ref 70–99)
Potassium: 3.8 mmol/L (ref 3.5–5.1)
Potassium: 3.3 mmol/L — ABNORMAL LOW (ref 3.5–5.1)
Sodium: 132 mmol/L — ABNORMAL LOW (ref 135–145)
Sodium: 136 mmol/L (ref 135–145)

## 2021-07-04 LAB — I-STAT VENOUS BLOOD GAS, ED
Acid-Base Excess: 0 mmol/L (ref 0.0–2.0)
Bicarbonate: 24.3 mmol/L (ref 20.0–28.0)
Calcium, Ion: 1.13 mmol/L — ABNORMAL LOW (ref 1.15–1.40)
HCT: 36 % — ABNORMAL LOW (ref 39.0–52.0)
Hemoglobin: 12.2 g/dL — ABNORMAL LOW (ref 13.0–17.0)
O2 Saturation: 96 %
Potassium: 3.7 mmol/L (ref 3.5–5.1)
Sodium: 134 mmol/L — ABNORMAL LOW (ref 135–145)
TCO2: 25 mmol/L (ref 22–32)
pCO2, Ven: 37.1 mmHg — ABNORMAL LOW (ref 44.0–60.0)
pH, Ven: 7.424 (ref 7.250–7.430)
pO2, Ven: 77 mmHg — ABNORMAL HIGH (ref 32.0–45.0)

## 2021-07-04 LAB — CBG MONITORING, ED
Glucose-Capillary: >600 mg/dL (ref 70–99)
Glucose-Capillary: 447 mg/dL — ABNORMAL HIGH (ref 70–99)
Glucose-Capillary: 565 mg/dL (ref 70–99)
Glucose-Capillary: 358 mg/dL — ABNORMAL HIGH (ref 70–99)
Glucose-Capillary: 283 mg/dL — ABNORMAL HIGH (ref 70–99)

## 2021-07-04 LAB — TROPONIN I (HIGH SENSITIVITY)
Troponin I (High Sensitivity): 17 ng/L (ref <18)
Troponin I (High Sensitivity): 15 ng/L (ref <18)

## 2021-07-04 LAB — AMMONIA: Ammonia: 18 umol/L (ref 9–35)

## 2021-07-04 LAB — PROTIME-INR
INR: 1.3 — ABNORMAL HIGH (ref 0.8–1.2)
Prothrombin Time: 15.9 seconds — ABNORMAL HIGH (ref 11.4–15.2)

## 2021-07-04 LAB — ETHANOL: Alcohol, Ethyl (B): <10 mg/dL (ref <10)

## 2021-07-04 LAB — BETA-HYDROXYBUTYRIC ACID: Beta-Hydroxybutyric Acid: 0.23 mmol/L (ref 0.05–0.27)

## 2021-07-04 MED ORDER — DEXMEDETOMIDINE HCL IN NACL 400 MCG/100ML IV SOLN
0.4000 ug/kg/h | INTRAVENOUS | Status: DC
  Administered 2021-07-04: 0.4 ug/kg/h via INTRAVENOUS
  Filled 2021-07-04: qty 100

## 2021-07-04 MED ORDER — LACTATED RINGERS IV SOLN: INTRAVENOUS | Status: DC

## 2021-07-04 MED ORDER — STERILE WATER FOR INJECTION IJ SOLN
INTRAMUSCULAR | Status: AC
  Filled 2021-07-04: qty 10

## 2021-07-04 MED ORDER — INSULIN REGULAR(HUMAN) IN NACL 100-0.9 UT/100ML-% IV SOLN
INTRAVENOUS | Status: AC
  Administered 2021-07-04: 5 [IU]/h via INTRAVENOUS
  Filled 2021-07-04 (×2): qty 100

## 2021-07-04 MED ORDER — HALOPERIDOL LACTATE 5 MG/ML IJ SOLN: 2.0000 mg | Freq: Four times a day (QID) | INTRAMUSCULAR | Status: DC | PRN

## 2021-07-04 MED ORDER — HEPARIN SODIUM (PORCINE) 5000 UNIT/ML IJ SOLN
5000.0000 [IU] | Freq: Three times a day (TID) | INTRAMUSCULAR | Status: DC
  Administered 2021-07-05 – 2021-07-10 (×15): 5000 [IU] via SUBCUTANEOUS
  Filled 2021-07-04 (×14): qty 1

## 2021-07-04 MED ORDER — NICARDIPINE HCL IN NACL 20-0.86 MG/200ML-% IV SOLN
3.0000 mg/h | INTRAVENOUS | Status: DC
Start: 2021-07-04 — End: 2021-07-04

## 2021-07-04 MED ORDER — ZIPRASIDONE MESYLATE 20 MG IM SOLR: 20.0000 mg | Freq: Once | INTRAMUSCULAR | Status: AC

## 2021-07-04 MED ORDER — LORAZEPAM 2 MG/ML IJ SOLN
2.0000 mg | Freq: Once | INTRAMUSCULAR | Status: DC
  Filled 2021-07-04: qty 1

## 2021-07-04 MED ORDER — NICARDIPINE HCL IN NACL 20-0.86 MG/200ML-% IV SOLN
3.0000 mg/h | INTRAVENOUS | Status: DC
  Filled 2021-07-04: qty 200

## 2021-07-04 MED ORDER — AMLODIPINE BESYLATE 5 MG PO TABS
5.0000 mg | ORAL_TABLET | Freq: Every day | ORAL | Status: DC
  Administered 2021-07-06 – 2021-07-07 (×2): 5 mg via ORAL
  Filled 2021-07-04 (×2): qty 1

## 2021-07-04 MED ORDER — HYDRALAZINE HCL 20 MG/ML IJ SOLN
10.0000 mg | Freq: Once | INTRAMUSCULAR | Status: AC
  Administered 2021-07-04: 10 mg via INTRAVENOUS
  Filled 2021-07-04: qty 1

## 2021-07-04 MED ORDER — MIDAZOLAM HCL 2 MG/2ML IJ SOLN
5.0000 mg | Freq: Once | INTRAMUSCULAR | Status: AC
  Administered 2021-07-04: 5 mg via INTRAVENOUS
  Filled 2021-07-04: qty 6

## 2021-07-04 MED ORDER — LEVETIRACETAM 500 MG PO TABS: 1000.0000 mg | ORAL_TABLET | Freq: Every day | ORAL | Status: DC

## 2021-07-04 MED ORDER — THIAMINE HCL 100 MG/ML IJ SOLN
500.0000 mg | Freq: Three times a day (TID) | INTRAVENOUS | Status: AC
  Administered 2021-07-05 – 2021-07-07 (×9): 500 mg via INTRAVENOUS
  Filled 2021-07-04 (×14): qty 5

## 2021-07-04 MED ORDER — ZIPRASIDONE MESYLATE 20 MG IM SOLR
INTRAMUSCULAR | Status: AC
  Administered 2021-07-04: 20 mg via INTRAMUSCULAR
  Filled 2021-07-04: qty 20

## 2021-07-04 MED ORDER — ACETAMINOPHEN 325 MG PO TABS
650.0000 mg | ORAL_TABLET | Freq: Four times a day (QID) | ORAL | Status: DC | PRN
  Administered 2021-07-06: 650 mg via ORAL
  Filled 2021-07-04: qty 2

## 2021-07-04 MED ORDER — HALOPERIDOL LACTATE 5 MG/ML IJ SOLN
2.0000 mg | Freq: Once | INTRAMUSCULAR | Status: AC
  Administered 2021-07-04: 2 mg via INTRAVENOUS
  Filled 2021-07-04: qty 1

## 2021-07-04 MED ORDER — ONDANSETRON HCL 4 MG/2ML IJ SOLN
4.0000 mg | Freq: Three times a day (TID) | INTRAMUSCULAR | Status: DC | PRN
  Administered 2021-07-06: 4 mg via INTRAVENOUS
  Filled 2021-07-04: qty 2

## 2021-07-04 MED ORDER — DOCUSATE SODIUM 100 MG PO CAPS
100.0000 mg | ORAL_CAPSULE | Freq: Two times a day (BID) | ORAL | Status: DC | PRN
  Administered 2021-07-09: 100 mg via ORAL
  Filled 2021-07-04: qty 1

## 2021-07-04 MED ORDER — LACTATED RINGERS IV BOLUS
20.0000 mL/kg | Freq: Once | INTRAVENOUS | Status: AC
  Administered 2021-07-04: 2384 mL via INTRAVENOUS

## 2021-07-04 MED ORDER — POLYETHYLENE GLYCOL 3350 17 G PO PACK
17.0000 g | PACK | Freq: Every day | ORAL | Status: DC | PRN
  Administered 2021-07-09: 17 g via ORAL
  Filled 2021-07-04: qty 1

## 2021-07-04 MED ORDER — PANTOPRAZOLE SODIUM 40 MG PO TBEC
40.0000 mg | DELAYED_RELEASE_TABLET | Freq: Every day | ORAL | Status: DC
  Administered 2021-07-06 – 2021-07-10 (×5): 40 mg via ORAL
  Filled 2021-07-04 (×5): qty 1

## 2021-07-04 MED ORDER — OXYCODONE HCL 5 MG PO TABS
5.0000 mg | ORAL_TABLET | Freq: Four times a day (QID) | ORAL | Status: DC | PRN
Start: 2021-07-04 — End: 2021-07-10
  Administered 2021-07-07: 5 mg via ORAL
  Filled 2021-07-04: qty 1

## 2021-07-04 MED ORDER — DEXTROSE 50 % IV SOLN: 0.0000 mL | INTRAVENOUS | Status: DC | PRN

## 2021-07-04 MED ORDER — ATORVASTATIN CALCIUM 40 MG PO TABS
40.0000 mg | ORAL_TABLET | Freq: Every day | ORAL | Status: DC
  Administered 2021-07-06 – 2021-07-10 (×5): 40 mg via ORAL
  Filled 2021-07-04 (×5): qty 1

## 2021-07-04 MED ORDER — SODIUM CHLORIDE 0.9 % IV BOLUS
1000.0000 mL | Freq: Once | INTRAVENOUS | Status: AC
  Administered 2021-07-04: 1000 mL via INTRAVENOUS

## 2021-07-04 MED ORDER — DEXTROSE IN LACTATED RINGERS 5 % IV SOLN: INTRAVENOUS | Status: DC

## 2021-07-04 MED ORDER — LORAZEPAM 2 MG/ML IJ SOLN
2.0000 mg | Freq: Once | INTRAMUSCULAR | Status: AC
  Administered 2021-07-04: 2 mg via INTRAVENOUS
  Filled 2021-07-04: qty 1

## 2021-07-04 MED ORDER — INSULIN ASPART 100 UNIT/ML IJ SOLN
10.0000 [IU] | Freq: Once | INTRAMUSCULAR | Status: AC
  Administered 2021-07-04: 10 [IU] via INTRAVENOUS

## 2021-07-04 MED ORDER — SODIUM CHLORIDE 0.9% FLUSH
3.0000 mL | Freq: Once | INTRAVENOUS | Status: AC
Start: 2021-07-04 — End: 2021-07-04
  Administered 2021-07-04: 3 mL via INTRAVENOUS

## 2021-07-04 MED ORDER — POTASSIUM CHLORIDE 10 MEQ/100ML IV SOLN
10.0000 meq | INTRAVENOUS | Status: AC
  Administered 2021-07-04 (×2): 10 meq via INTRAVENOUS
  Filled 2021-07-04 (×2): qty 100

## 2021-07-04 NOTE — H&P (Signed)
NAME:  Brandon [name], MRN:  [id number], DOB:  1961-01-15, LOS: 0 ADMISSION DATE:  07/04/2021, CONSULTATION DATE:  07/04/21 REFERRING MD: EDP , CHIEF COMPLAINT:  AMS  History of Present Illness:  60 y/o M with past medical history significant for IDDM, schizophrenia on Quetiapine, HTN, CVA with residual L-sided deficits, stage III CKD, seizure disorder, OSAn  who presented to the ED with SO for increasing agitation and altered mental status.  He reportedly stopped taking his medications sometime recently, his significant other states that at baseline he has frequent hallucinations and paranoid thoughts secondary to PTSD and periodically stops taking his medications and ends up hospitalized.    In the ED he was initially a code stroke, initial head CT was without acute findings and he was found to be significantly hypertensive with BP as high as 230/133, glucose >800 without anion gap and normal pH, and creatinine 2.5 (baseline 1.7-2.4).  He was given Ativan and Geodon and still had significant agitation, as well as hydralazine pushes for BP and remained hypertensive, therefore PCCM was consulted.  His partner states that he occasionally drinks alcohol, but she does not think this is daily and he has tested positive for street drugs in the past, but she has not seen any recent evidence of use.  Denies recent fevers, chills, illnesses or falls.  Pertinent  Medical History   has a past medical history of Acute ischemic stroke ([place]) (11/2013), Allergy, Anxiety, Arthritis, Benign hypertension with CKD (chronic kidney disease) stage III ([place]), Bil Renal Ca (dx'd 09/2011 & 11/2011), CVA (cerebral vascular accident) ([place]), Depression, Diabetes mellitus, Diverticulitis, ED (erectile dysfunction), Focal seizure ([place]) (11/2013), GERD (gastroesophageal reflux disease), Hiatal hernia, Hyperlipidemia, Hypertension, Kidney tumor (09/2011), Seizures ([place]), Sleep apnea, Thyroid disease, and Wears glasses.  Significant  Hospital Events: Including procedures, antibiotic start and stop dates in addition to other pertinent events   7/31 Admit to PCCM, negative head CT  Interim History / Subjective:  As above  Objective   Blood pressure (!) 228/133, pulse (!) 105, temperature 98.8 F (37.1 C), temperature source Oral, resp. rate (!) 22, weight 119.2 kg, SpO2 100 %.        Intake/Output Summary (Last 24 hours) at 07/04/2021 2226 Last data filed at 07/04/2021 1900 Gross per 24 hour  Intake --  Output 150 ml  Net -150 ml   Filed Weights   07/04/21 1845  Weight: 119.2 kg    General:  well-nourished, chronically ill-appearing M, restless and disoriented  HEENT: MM pink/moist, pupils equal, sclera anicteric Neuro: disoriented and agitated, not answering questions or interactive, moving all extremities and no obvious facial droop CV: s1s2 rrr, no m/r/g PULM: clear bilaterally, room air without retractions or distress GI: soft, bsx4 active  Extremities: warm/dry, no edema  Skin: no rashes or lesions   Resolved Hospital Problem list     Assessment & Plan:    Acute Encephalopathy Superimposed on baseline schizophrenia and PTSD, POA Suspect this is likely multi-factorial and secondary to stopping Seroquel along with hypertensive urgency, consider PRES, along with HHS P: -Admit to ICU and continue prn Haldol along with resuming home Seroquel, Precedex if needed -MRI brain when patient will tolerate to eval for acute CVA or PRES -EEG to r/o seizure activity -treat BP and hyperglycemia  -ETOH level <10, check UDS -thiamine daily    Hypertensive Emergency After stopping home medications HCTZ and Norvasc for unknown amount of time P: -start nicardipine gtt, resume home med when able to tolerate  po's -check CXR for mediastinal widening, troponin re-assuring -Last echo 02/2020 with EF 60-65% and mild mitral and tricuspid regurgitation, consider repeat Echo if indicated   HHS Glucose >800  without anion gap or acidemia  P: -continue insulin gtt per protocol and transition to home insulin when improved -monitor and replete electrolytes -received over 3L IVF in the ED   Acute on chronic renal failure Baseline creatinine around 1.7-2.4, up to 2.5 on admission Suspect secondary to pre-renal volume depletion in the setting of hyperglycemia P: -follow repeat BMP after volume repletion and monitor urine output -avoid nephrotoxins as able    Seizure Disorder -Check spot EEG in the setting AMS and not taking his medications -Resume home Keppra IV  HL -continue statin      Best Practice (right click and "Reselect all SmartList Selections" daily)   Diet/type: NPO DVT prophylaxis: systemic heparin GI prophylaxis: N/A Lines: N/A Foley:  N/A Code Status:  full code Last date of multidisciplinary goals of care discussion [Code status confirmed with SO 7/31]  Labs   CBC: Recent Labs  Lab 07/04/21 1844 07/04/21 1846 07/04/21 1954  WBC 5.9  --   --   NEUTROABS 3.5  --   --   HGB 11.2* 11.6* 12.2*  HCT 32.2* 34.0* 36.0*  MCV 85.9  --   --   PLT 215  --   --     Basic Metabolic Panel: Recent Labs  Lab 07/04/21 1844 07/04/21 1846 07/04/21 1916 07/04/21 1954  NA 130* 133* 132* 134*  K 3.8 3.9 3.8 3.7  CL 98 99 100  --   CO2 23  --  22  --   GLUCOSE 800* >700* 773*  --   BUN 21* 23* 21*  --   CREATININE 2.51* 2.40* 2.44*  --   CALCIUM 8.7*  --  8.9  --    GFR: CrCl cannot be calculated (Unknown ideal weight.). Recent Labs  Lab 07/04/21 1844  WBC 5.9    Liver Function Tests: Recent Labs  Lab 07/04/21 1844  AST 13*  ALT 15  ALKPHOS 158*  BILITOT 1.0  PROT 6.7  ALBUMIN 3.2*   No results for input(s): LIPASE, AMYLASE in the last 168 hours. No results for input(s): AMMONIA in the last 168 hours.  ABG    Component Value Date/Time   HCO3 24.3 07/04/2021 1954   TCO2 25 07/04/2021 1954   O2SAT 96.0 07/04/2021 1954     Coagulation  Profile: Recent Labs  Lab 07/04/21 1844  INR 1.3*    Cardiac Enzymes: No results for input(s): CKTOTAL, CKMB, CKMBINDEX, TROPONINI in the last 168 hours.  HbA1C: Hgb A1c MFr Bld  Date/Time Value Ref Range Status  04/05/2020 09:50 PM 14.2 (H) 4.8 - 5.6 % Final    Comment:    (NOTE) Pre diabetes:          5.7%-6.4% Diabetes:              >6.4% Glycemic control for   <7.0% adults with diabetes   01/09/2020 11:43 AM 11.7 (H) 4.8 - 5.6 % Final    Comment:             Prediabetes: 5.7 - 6.4          Diabetes: >6.4          Glycemic control for adults with diabetes: <7.0     CBG: Recent Labs  Lab 07/04/21 1840 07/04/21 2041 07/04/21 2123 07/04/21 2214  GLUCAP >600* 565*  447* 358*    Review of Systems:   Unable to obtain secondary to AMS  Past Medical History:  He,  has a past medical history of Acute ischemic stroke ([place]) (11/2013), Allergy, Anxiety, Arthritis, Benign hypertension with CKD (chronic kidney disease) stage III ([place]), Bil Renal Ca (dx'd 09/2011 & 11/2011), CVA (cerebral vascular accident) ([place]), Depression, Diabetes mellitus, Diverticulitis, ED (erectile dysfunction), Focal seizure ([place]) (11/2013), GERD (gastroesophageal reflux disease), Hiatal hernia, Hyperlipidemia, Hypertension, Kidney tumor (09/2011), Seizures ([place]), Sleep apnea, Thyroid disease, and Wears glasses.   Surgical History:   Past Surgical History:  Procedure Laterality Date   COLONOSCOPY     IR RADIOLOGIST EVAL & MGMT  04/04/2017   KIDNEY SURGERY     ablation of renal cell CA - 12/28, prior one was October [place]   TEE WITHOUT CARDIOVERSION N/A 11/19/2013   Procedure: TRANSESOPHAGEAL ECHOCARDIOGRAM (TEE);  Surgeon: Lelon Perla, MD;  [facility] ENDOSCOPY;  Service: Cardiovascular;  Laterality: N/A;     Social History:   reports that he has never smoked. He has never used smokeless tobacco. He reports previous alcohol use. He reports that he does not use drugs.   Family  History:  His family history includes Breast cancer in his sister; Diabetes in his father; Heart disease in his father; Pneumonia in his mother; Prostate cancer in an other family member; Stomach cancer in an other family member. There is no history of Colon cancer, Esophageal cancer, or Rectal cancer.   Allergies Allergies  Allergen Reactions   Morphine Itching     Home Medications  Prior to Admission medications   Medication Sig Start Date End Date Taking? Authorizing Provider  amLODipine (NORVASC) 5 MG tablet TAKE 1 TABLET BY MOUTH EVERY DAY 07/29/20   Tysinger, Camelia Eng, PA-C  aspirin EC 81 MG tablet Take 1 tablet (81 mg total) by mouth daily. Patient not taking: Reported on 09/18/2020 01/09/20   Carlena Hurl, PA-C  atorvastatin (LIPITOR) 40 MG tablet TAKE 1 TABLET BY MOUTH EVERY DAY 07/29/20   Tysinger, Camelia Eng, PA-C  blood glucose meter kit and supplies Dispense based on patient and insurance preference. Use up to four times daily as directed. (FOR ICD-9 250.00, 250.01). 04/24/16   Eugenie Filler, MD  ezetimibe (ZETIA) 10 MG tablet Take 1 tablet (10 mg total) by mouth daily. 01/27/20 07/15/20  Tolia, Sunit, DO  glucose blood (ACCU-CHEK ACTIVE STRIPS) test strip Use as instructed 06/11/19   Tysinger, Camelia Eng, PA-C  glucose blood test strip accu chek active 1-2 times daily 01/17/20   Tysinger, Camelia Eng, PA-C  hydrochlorothiazide (MICROZIDE) 12.5 MG capsule TAKE 1 CAPSULE BY MOUTH EVERY DAY IN THE MORNING 11/23/20   Tolia, Sunit, DO  HYDROcodone-acetaminophen (NORCO) 5-325 MG tablet Take 1 tablet by mouth every 6 (six) hours as needed. 09/18/20   Tysinger, Camelia Eng, PA-C  HYDROcodone-acetaminophen (NORCO) 7.5-325 MG tablet Take 1 tablet by mouth every 4 (four) hours as needed. 09/21/20   Tysinger, Camelia Eng, PA-C  insulin aspart (NOVOLOG FLEXPEN) 100 UNIT/ML FlexPen Inject 12 Units into the skin 3 (three) times daily with meals. 05/14/20   Shamleffer, Melanie Crazier, MD  insulin glargine (LANTUS  SOLOSTAR) 100 UNIT/ML Solostar Pen Inject 48 Units into the skin daily. 05/14/20   Shamleffer, Melanie Crazier, MD  Lancets (ACCU-CHEK SOFT TOUCH) lancets Use as instructed 06/11/19   Tysinger, Camelia Eng, PA-C  levETIRAcetam (KEPPRA) 500 MG tablet TAKE 2 TABLETS (1,000 MG TOTAL) BY MOUTH DAILY. 12/29/20  Tysinger, Camelia Eng, PA-C  NOVOFINE PLUS PEN NEEDLE 32G X 4 MM MISC 1 EACH BY DOES NOT APPLY ROUTE DAILY. USE FOR INSULIN PENS 05/12/21   Tysinger, Camelia Eng, PA-C  omeprazole (PRILOSEC) 40 MG capsule TAKE 1 CAPSULE BY MOUTH EVERY DAY 01/25/21   Tysinger, Camelia Eng, PA-C  ondansetron (ZOFRAN) 4 MG tablet Take 1 tablet (4 mg total) by mouth every 8 (eight) hours as needed for nausea or vomiting. 09/21/20   Tysinger, Camelia Eng, PA-C  QUEtiapine (SEROQUEL) 25 MG tablet Take 1 tablet (25 mg total) by mouth daily. 07/05/19   Tysinger, Camelia Eng, PA-C  Semaglutide, 1 MG/DOSE, (OZEMPIC, 1 MG/DOSE,) 2 MG/1.5ML SOPN Inject 0.75 mLs (1 mg total) into the skin once a week. 05/14/20   Shamleffer, Melanie Crazier, MD  sertraline (ZOLOFT) 100 MG tablet Take 1 tablet (100 mg total) by mouth daily. 07/05/19   Tysinger, Camelia Eng, PA-C  sildenafil (VIAGRA) 50 MG tablet Take 1 tablet (50 mg total) by mouth as needed for erectile dysfunction. 12/28/20 12/28/21  Tysinger, Camelia Eng, PA-C  tamsulosin (FLOMAX) 0.4 MG CAPS capsule Take 1 capsule (0.4 mg total) by mouth daily. Patient not taking: Reported on 09/18/2020 04/07/20   Ceasar Mons, MD  UNABLE TO FIND Med Name:  Custom diabetic shoes  Dx: E78.41 05/14/20   Shamleffer, Melanie Crazier, MD     Critical care time: 50 minutes    CRITICAL CARE Performed by: Otilio Carpen Jaymie Mckiddy   Total critical care time: 50 minutes  Critical care time was exclusive of separately billable procedures and treating other patients.  Critical care was necessary to treat or prevent imminent or life-threatening deterioration.  Critical care was time spent personally by me on the following  activities: development of treatment plan with patient and/or surrogate as well as nursing, discussions with consultants, evaluation of patient's response to treatment, examination of patient, obtaining history from patient or surrogate, ordering and performing treatments and interventions, ordering and review of laboratory studies, ordering and review of radiographic studies, pulse oximetry and re-evaluation of patient's condition.   Otilio Carpen Cleone Hulick, PA-C [facility] See Amion for pager If no response to pager , please call [phone] until 7pm After 7:00 pm call Elink  [phone]?[place]

## 2021-07-04 NOTE — ED Triage Notes (Signed)
Pt from home BIB EMS for c/o AMS. LKWT 1500. Pt was at the grocery store checkout when he started to speak unintelligibly. Hx of stroke (2013), HTN, DM. BGL reading "high" at the bridge.

## 2021-07-04 NOTE — Progress Notes (Addendum)
Pt brought to MRI for exam. Pt was given Ativan prior to being brought to MRI. Upon attempting to setup the pt for the exam, pt continuously attempted get up off the scan table and made multiple attempts to remove the coil required for imaging. Pt seemed to relax and was put in the scanner. Immediately after exiting the scan room, the pt began making further attempts to remove imaging coil and get up off the scan table. Pt was safety taken out of the scanner, RN was contacted and administered further meds to no avail as pt continued to be uncooperative attempting to remove monitoring equipment and get up off the scan table. Unable to begin exam. Pt sent back to ED.

## 2021-07-04 NOTE — ED Notes (Signed)
Unable to get accurate BP due to pt's agitation

## 2021-07-04 NOTE — Code Documentation (Addendum)
Stroke Response Nurse Documentation Code Documentation  Brandon [name] is a 60 y.o. male arriving to [facility] ED via [facility] on 07/04/2021 with past medical hx of DM, CVA, and seizure, . Code stroke was activated by EMS. Patient from home where he was LKW at 1700 in the grocery store check out with his significant other when he suddenly became confused and was speaking nonsense.  On aspirin 81 mg daily. Stroke team at the bedside on patient arrival. Labs drawn and patient cleared for CT by Dr. Karle Starch. Patient to CT with team. NIHSS 4, see documentation for details and code stroke times. Patient with disoriented and Global aphasia  on exam. He is very restless and mildly agitated.  The following imaging was completed:  CT and stat MRI.[name]    His CBG on arrival is >700.  Insulin gtt started  BP 210/110  Hydralazine given Ativan given prior to MRI  Raliegh Ip  Stroke Response RN

## 2021-07-04 NOTE — ED Notes (Signed)
Assumed care at this time from [facility]. Pt currently on soft restraints in front of RN station. Pt continues to appear agitated with muscle movements. Pt on IVPB as mentioned in flowsheet. Spouse at bedside.

## 2021-07-04 NOTE — ED Provider Notes (Signed)
[facility] EMERGENCY DEPARTMENT Provider Note   CSN: [id number] Arrival date & time: 07/04/21  1287     History Chief Complaint  Patient presents with   Altered Mental Status    Brandon [name] is a 60 y.o. male with history of insulin-dependent diabetes, stroke, hypertension, hyperlipidemia, presenting from home with concern for altered mental status.  Patient arrives initially as a code stroke with slurred speech and dysarthria that reportedly began while he was grocery shopping with his wife.  His wife reports the patient her confusion was worsening since this morning.  She says he has a history of noncompliance with medications especially his insulin, does not hospitalize several times for this in the past.  She said this morning he seemed confused and was mixing up his shoes, trying to put his shoes on top of hers.  While at the grocery store speech became slurred.  On arrival the patient initially was nonverbal but awake, and subsequently has become very vocal and agitated.  Medical record review shows the patient was admitted with acute metabolic encephalopathy in March of this year at [facility].  For that review was felt to be multifactorial related to severe hyperglycemia but also may have had a component of hypertensive encephalopathy in the setting of cocaine use. He has baseline cognitive deficits that his wife acknowledges and states "he has some autism".  MRI while in the hospital at [facility] showed no acute infarct but did note a remote right posterior MCA infarct.    HPI     Past Medical History:  Diagnosis Date   Acute ischemic stroke ([place]) 11/2013   Allergy    Anxiety    Arthritis    Benign hypertension with CKD (chronic kidney disease) stage III (HCC)    Bil Renal Ca dx'd 09/2011 & 11/2011   left and right; cryoablation bil   CVA (cerebral vascular accident) ([place])    stroke   Depression    BH Adm in [place] Depression    Diabetes mellitus    DKA prior hospitalization   Diverticulitis    s/p micorperforation Sept 2012-managed conservatively by Gen surgery   ED (erectile dysfunction)    Focal seizure ([place]) 11/2013   due to ischemic stroke   GERD (gastroesophageal reflux disease)    Hiatal hernia    Hyperlipidemia    Hypertension    Kidney tumor 09/2011   Renal cell CA   Seizures ([place])    none since 2016, taking Keppra - maw   Sleep apnea    Thyroid disease    "weak thyroid" per MD   Wears glasses     Patient Active Problem List   Diagnosis Date Noted   Testicular pain, right 09/18/2020   Nausea and vomiting 09/18/2020   Abdominal discomfort 09/18/2020   Hypoglycemia 09/18/2020   Polypharmacy 09/18/2020   Testicular pain, left 04/16/2020   Pelvic pain 04/16/2020   Anemia 04/16/2020   Anxiety 04/16/2020   Depressed mood 04/16/2020   Acute renal failure superimposed on stage 3a chronic kidney disease ([place]) 04/06/2020   Abnormal radiologic findings on diagnostic imaging of right testicle 04/06/2020   Type 2 diabetes mellitus with diabetic polyneuropathy, with long-term current use of insulin ([place]) 02/09/2020   Type 2 diabetes mellitus with stage 3a chronic kidney disease, with long-term current use of insulin ([place]) 02/09/2020   Family history of premature CAD 01/27/2020   Stage 3b chronic kidney disease ([place]) 01/09/2020   Incontinence of  feces 01/09/2020   Chronic pain of left knee 01/09/2020   Left leg pain 01/09/2020   Fall 01/09/2020   History of diverticulitis 10/30/2019   Noncompliance 06/10/2019   History of renal cell cancer 04/24/2018   Erectile dysfunction 04/24/2018   Edema 03/15/2018   Seizure disorder ([place]) 03/01/2018   History of stroke 11/29/2017   Obstructive sleep apnea 11/29/2017   Uncontrolled diabetes mellitus ([place]) 05/10/2017   Dyslipidemia associated with type 2 diabetes mellitus ([place]) 05/10/2017   Hypertension associated with diabetes ([place]) 05/10/2017   History of  cerebrovascular accident (CVA) with residual deficit 05/10/2017   Diabetic ulcer of left foot associated with secondary diabetes mellitus ([place]) 04/21/2016   History of seizure 01/05/2016   Depression, recurrent ([place]) 09/21/2014   Benign hypertension with CKD (chronic kidney disease) stage III ([place]) 12/30/2011   Side pain 12/30/2011    Past Surgical History:  Procedure Laterality Date   COLONOSCOPY     IR RADIOLOGIST EVAL & MGMT  04/04/2017   KIDNEY SURGERY     ablation of renal cell CA - 12/28, prior one was October [place]   TEE WITHOUT CARDIOVERSION N/A 11/19/2013   Procedure: TRANSESOPHAGEAL ECHOCARDIOGRAM (TEE);  Surgeon: Lelon Perla, MD;  Location: [facility] ENDOSCOPY;  Service: Cardiovascular;  Laterality: N/A;       Family History  Problem Relation Age of Onset   Diabetes Father    Heart disease Father    Pneumonia Mother    Prostate cancer Other    Stomach cancer Other    Breast cancer Sister    Colon cancer Neg Hx    Esophageal cancer Neg Hx    Rectal cancer Neg Hx     Social History   Tobacco Use   Smoking status: Never   Smokeless tobacco: Never  Vaping Use   Vaping Use: Never used  Substance Use Topics   Alcohol use: Not Currently    Comment: rarely   Drug use: No    Home Medications Prior to Admission medications   Medication Sig Start Date End Date Taking? Authorizing Provider  amLODipine (NORVASC) 5 MG tablet TAKE 1 TABLET BY MOUTH EVERY DAY 07/29/20   Tysinger, Camelia Eng, PA-C  aspirin EC 81 MG tablet Take 1 tablet (81 mg total) by mouth daily. Patient not taking: Reported on 09/18/2020 01/09/20   Carlena Hurl, PA-C  atorvastatin (LIPITOR) 40 MG tablet TAKE 1 TABLET BY MOUTH EVERY DAY 07/29/20   Tysinger, Camelia Eng, PA-C  blood glucose meter kit and supplies Dispense based on patient and insurance preference. Use up to four times daily as directed. (FOR ICD-9 250.00, 250.01). 04/24/16   Eugenie Filler, MD  ezetimibe (ZETIA) 10 MG tablet Take  1 tablet (10 mg total) by mouth daily. 01/27/20 07/15/20  Tolia, Sunit, DO  glucose blood (ACCU-CHEK ACTIVE STRIPS) test strip Use as instructed 06/11/19   Tysinger, Camelia Eng, PA-C  glucose blood test strip accu chek active 1-2 times daily 01/17/20   Tysinger, Camelia Eng, PA-C  hydrochlorothiazide (MICROZIDE) 12.5 MG capsule TAKE 1 CAPSULE BY MOUTH EVERY DAY IN THE MORNING 11/23/20   Tolia, Sunit, DO  HYDROcodone-acetaminophen (NORCO) 5-325 MG tablet Take 1 tablet by mouth every 6 (six) hours as needed. 09/18/20   Tysinger, Camelia Eng, PA-C  HYDROcodone-acetaminophen (NORCO) 7.5-325 MG tablet Take 1 tablet by mouth every 4 (four) hours as needed. 09/21/20   Tysinger, Camelia Eng, PA-C  insulin aspart (NOVOLOG FLEXPEN) 100 UNIT/ML FlexPen Inject 12 Units into the  skin 3 (three) times daily with meals. 05/14/20   Shamleffer, Melanie Crazier, MD  insulin glargine (LANTUS SOLOSTAR) 100 UNIT/ML Solostar Pen Inject 48 Units into the skin daily. 05/14/20   Shamleffer, Melanie Crazier, MD  Lancets (ACCU-CHEK SOFT TOUCH) lancets Use as instructed 06/11/19   Tysinger, Camelia Eng, PA-C  levETIRAcetam (KEPPRA) 500 MG tablet TAKE 2 TABLETS (1,000 MG TOTAL) BY MOUTH DAILY. 12/29/20   Tysinger, Camelia Eng, PA-C  NOVOFINE PLUS PEN NEEDLE 32G X 4 MM MISC 1 EACH BY DOES NOT APPLY ROUTE DAILY. USE FOR INSULIN PENS 05/12/21   Tysinger, Camelia Eng, PA-C  omeprazole (PRILOSEC) 40 MG capsule TAKE 1 CAPSULE BY MOUTH EVERY DAY 01/25/21   Tysinger, Camelia Eng, PA-C  ondansetron (ZOFRAN) 4 MG tablet Take 1 tablet (4 mg total) by mouth every 8 (eight) hours as needed for nausea or vomiting. 09/21/20   Tysinger, Camelia Eng, PA-C  QUEtiapine (SEROQUEL) 25 MG tablet Take 1 tablet (25 mg total) by mouth daily. 07/05/19   Tysinger, Camelia Eng, PA-C  Semaglutide, 1 MG/DOSE, (OZEMPIC, 1 MG/DOSE,) 2 MG/1.5ML SOPN Inject 0.75 mLs (1 mg total) into the skin once a week. 05/14/20   Shamleffer, Melanie Crazier, MD  sertraline (ZOLOFT) 100 MG tablet Take 1 tablet (100 mg total) by  mouth daily. 07/05/19   Tysinger, Camelia Eng, PA-C  sildenafil (VIAGRA) 50 MG tablet Take 1 tablet (50 mg total) by mouth as needed for erectile dysfunction. 12/28/20 12/28/21  Tysinger, Camelia Eng, PA-C  tamsulosin (FLOMAX) 0.4 MG CAPS capsule Take 1 capsule (0.4 mg total) by mouth daily. Patient not taking: Reported on 09/18/2020 04/07/20   Ceasar Mons, MD  UNABLE TO FIND Med Name:  Custom diabetic shoes  Dx: O11.57 05/14/20   Shamleffer, Melanie Crazier, MD    Allergies    Morphine  Review of Systems   Review of Systems  Unable to perform ROS: Mental status change (level 5 caveat)   Physical Exam Updated Vital Signs Wt 119.2 kg   BMI 34.67 kg/m   Physical Exam Constitutional:      General: He is not in acute distress. HENT:     Head: Normocephalic and atraumatic.  Eyes:     Conjunctiva/sclera: Conjunctivae normal.     Pupils: Pupils are equal, round, and reactive to light.  Cardiovascular:     Rate and Rhythm: Normal rate and regular rhythm.     Pulses: Normal pulses.  Pulmonary:     Effort: Pulmonary effort is normal. No respiratory distress.  Abdominal:     General: There is no distension.     Tenderness: There is no abdominal tenderness.  Skin:    General: Skin is warm and dry.  Neurological:     Mental Status: He is alert.  Psychiatric:        Mood and Affect: Mood normal.        Behavior: Behavior normal.    ED Results / Procedures / Treatments   Labs (all labs ordered are listed, but only abnormal results are displayed) Labs Reviewed  PROTIME-INR - Abnormal; Notable for the following components:      Result Value   Prothrombin Time 15.9 (*)    INR 1.3 (*)    All other components within normal limits  CBC - Abnormal; Notable for the following components:   RBC 3.75 (*)    Hemoglobin 11.2 (*)    HCT 32.2 (*)    RDW 11.4 (*)    All other components within normal limits  I-STAT CHEM 8, ED - Abnormal; Notable for the following components:   Sodium  133 (*)    BUN 23 (*)    Creatinine, Ser 2.40 (*)    Glucose, Bld >700 (*)    Calcium, Ion 1.12 (*)    Hemoglobin 11.6 (*)    HCT 34.0 (*)    All other components within normal limits  CBG MONITORING, ED - Abnormal; Notable for the following components:   Glucose-Capillary >600 (*)    All other components within normal limits  APTT  DIFFERENTIAL  COMPREHENSIVE METABOLIC PANEL  BASIC METABOLIC PANEL  BASIC METABOLIC PANEL  BASIC METABOLIC PANEL  BASIC METABOLIC PANEL  BETA-HYDROXYBUTYRIC ACID  BETA-HYDROXYBUTYRIC ACID  URINALYSIS, ROUTINE W REFLEX MICROSCOPIC  CBG MONITORING, ED  I-STAT VENOUS BLOOD GAS, ED  TROPONIN I (HIGH SENSITIVITY)    EKG None  Radiology CT HEAD CODE STROKE WO CONTRAST  Result Date: 07/04/2021 CLINICAL DATA:  Code stroke. Neuro deficit, acute, stroke suspected. Additional history provided: Isolated expressive aphasia. EXAM: CT HEAD WITHOUT CONTRAST TECHNIQUE: Contiguous axial images were obtained from the base of the skull through the vertex without intravenous contrast. COMPARISON:  Prior head CT examinations 07/15/2020 and earlier. Brain MRI 10/18/2016. FINDINGS: Brain: Redemonstrated large chronic right MCA territory cortical/subcortical infarct predominantly affecting the right parietal and temporal lobes. Within limitations of motion degradation, there is no appreciable acute intracranial hemorrhage or acute demarcated cortical infarction. No intracranial mass or extra-axial fluid collection is identified. No midline shift Vascular: No appreciable hyperdense vessel. Atherosclerotic calcifications. Skull: Normal. Negative for fracture or focal lesion. Sinuses/Orbits: Visualized orbits show no acute finding. No significant paranasal sinus disease at the imaged levels. ASPECTS [facility] Stroke Program Early CT Score) - Ganglionic level infarction (caudate, lentiform nuclei, internal capsule, insula, M1-M3 cortex): 7 - Supraganglionic infarction (M4-M6 cortex): 3  Total score (0-10 with 10 being normal): 10 (when discounting chronic infarcts). These results were communicated to Dr. Cheral Marker At 7:01 pmon 7/31/2022by text page via the [facility] messaging system. IMPRESSION: Significantly motion degraded examination, limiting evaluation. No appreciable acute intracranial abnormality. Redemonstrated large chronic right MCA territory cortical/subcortical infarct, predominantly affecting the right parietal and temporal lobes. Electronically Signed   By: Kellie Simmering DO   On: 07/04/2021 19:01    Procedures .Critical Care  Date/Time: 07/04/2021 10:22 PM Performed by: Wyvonnia Dusky, MD Authorized by: Wyvonnia Dusky, MD   Critical care provider statement:    Critical care time (minutes):  65   Critical care was necessary to treat or prevent imminent or life-threatening deterioration of the following conditions:  Metabolic crisis   Critical care was time spent personally by me on the following activities:  Discussions with consultants, evaluation of patient's response to treatment, examination of patient, ordering and performing treatments and interventions, ordering and review of laboratory studies, ordering and review of radiographic studies, pulse oximetry, re-evaluation of patient's condition, obtaining history from patient or surrogate and review of old charts Comments:     Acute metabolic encephalopathy and hypertensive emergency, frequent bedside reassessments    Medications Ordered in ED Medications  lactated ringers bolus 2,384 mL (has no administration in time range)  insulin regular, human (MYXREDLIN) 100 units/ 100 mL infusion (has no administration in time range)  lactated ringers infusion (has no administration in time range)  dextrose 5 % in lactated ringers infusion (has no administration in time range)  dextrose 50 % solution 0-50 mL (has no administration in time range)  potassium chloride 10 mEq in  100 mL IVPB (has no administration in time  range)  LORazepam (ATIVAN) injection 2 mg (has no administration in time range)  sodium chloride flush (NS) 0.9 % injection 3 mL (3 mLs Intravenous Given 07/04/21 1905)  sodium chloride 0.9 % bolus 1,000 mL (1,000 mLs Intravenous New Bag/Given 07/04/21 1905)    ED Course  I have reviewed the triage vital signs and the nursing notes.  Pertinent labs & imaging results that were available during my care of the patient were reviewed by me and considered in my medical decision making (see chart for details).  AMS - ddx includes CVA vs metabolic encephalopathy vs drug intoxication vs hypertensive emergency vs other  Initially arrived as code stroke, evaluated by neurology - see consult note.   This was felt to be more likely confusion related to hyperglycemia & hypertension than a CVA per neurology consultation. Unclear if there may be drug use/cocaine component, as was found on his prior hospitalization.  His wife reports he has been noncompliant with meds at home, became very hostile with her when she encouraged him to take his insulin yesterday.  BS 800.  No anion gap.  K 3.8.  Beta hydroxy level 0.23.  Etoh negative.  Initial trop 15.  IV hydralazine ordered for hypertension.  Difficult given the patient's level of agitation, requiring repetitive doses of benzos + antipsychotic medications.  Soft restraints ordered.  Manual BP remained elevated after hydralazine.  Case discussed with ICU who will be admitting for further mgmt, nicardipine infusion ordered - goal SBP 163mhg w/ some permissive hypertension.  Unable to complete MRI due to agitation at this time, can reattempt after sedation.  Clinical Course as of 07/04/21 2221  SNancy FetterJul 31, 2022  1916 Patient presents altered with intermittent confusion.  Concern for hyperglycemic crisis.  We are initiating insulin and fluids. [MT]  1930 No obvious infarct on CT head.  Neurology team at bedside and recommending management of medical cause of  altered mental status, and I agree this is more likely related to metabolic encephalopathy and possible hyperglycemic crisis.  IV fluids and insulin are running.  Patient's wife is now present.  The patient is more responsive than he was at arrival, but continues to be confused at baseline. [MT]  2001 No acidosis on VBG. [MT]  2030 Paged ICU [MT]  2032 Patient was unable to tolerate MRI because of combativeness, uncooperativeness.  I have asked her additional IV Versed to be given as I feel we need more rapid sedation  [MT]  2046 I spoke to the ICU attending who recommended that we attempt to push dose hydralazine and reassess the patient's blood pressure after the sedation is kicked in, to see if the hypertension is improved.  Medication has been ordered. [MT]  2113 Patient remains agitated, pulling out lines.  We are unable to hold still long enough to get a blood pressure.  I have asked for 20 mg of IM Geodon to be given, which was done now. [MT]  2129 ICU to come evaluate patient for admission. [MT]    Clinical Course User Index [MT] Naavya Postma, MCarola Rhine MD    Final Clinical Impression(s) / ED Diagnoses Final diagnoses:  None    Rx / DC Orders ED Discharge Orders     None        TWyvonnia Dusky MD 07/04/21 2227

## 2021-07-04 NOTE — ED Notes (Signed)
Unable to obtain MRI due to pt's agitation level and not being able to remain still in the machine. MD and neurologist made aware.

## 2021-07-04 NOTE — ED Provider Notes (Signed)
Brought to ED as a Code Stroke with dysarthria/aphasia. Airway cleared at [facility].    Truddie Hidden, MD 07/04/21 [id number]

## 2021-07-04 NOTE — Consult Note (Signed)
Referring Physician: Dr. Langston Masker    Chief Complaint: Acute onset of garbled speech  HPI: Brandon [name] is an 60 y.o. male with a PMHx of stroke in 2013, anxiety, PTSD, CKD III, bilateral renal cancer, depression, DM, prior hospitalization for DKA, HLD, HTN, DM, DKA and focal seizures (on Keppra), who presents acutely via EMS after sudden onset of garbled speech. Wife came home from out of town. He was outside on the porch sitting, he told wife he was getting into the truck to get a milkshake at [facility]. While in the line he started to get confused and his speech was becoming difficult to understand. He started speaking gibberish. He started handling objects randomly in the car, folding and unfolding papers. Wife thought his blood sugar was low at that point. They drove around and he started to eat. Then he took the corndog and stuck it into the milkshake. Then wife drove to the fire station. His CBG there read as HIGH. They noted his confusion and they called EMS. At the fiere station, he continued to be confused, was "fussing at people who weren't there" and was trying to put on a pair of wife's shoes over his own.    Per wife he takes Seroquel for schizophrenia, PTSD and bipolar disorder. When he does not take his Seroquel he becomes agitated. He also has a diagnosis from his Psychiatrist of autism that the Psychiatrist feels was undiagnosed from childhood.   Wife's telephone number: [phone] Barbette Or).   Dr. Langston Masker obtained further history from the patient's wife and chart review: "Medical record review shows the patient was admitted with acute metabolic encephalopathy in March of this year at [facility].  For that review was felt to be multifactorial related to severe hyperglycemia but also may have had a component of hypertensive encephalopathy.  He has baseline cognitive deficits that his wife acknowledges and states "he has some autism".  MRI while in the hospital at [facility] showed no acute infarct but did note a remote right posterior MCA infarct.  Patient was also noted to have cocaine intoxication."  LSN: [id number] tPA Given: No: Presentation most consistent with acute delirium in the setting of severe hyperglycemia. Speech became fluent and intelligible after CT, but continued to be confused and became agitated and hostile. Presentation is atypical for stroke. Risks of tPA outweigh benefits.   Past Medical History:  Diagnosis Date   Acute ischemic stroke ([place]) 11/2013   Allergy    Anxiety    Arthritis    Benign hypertension with CKD (chronic kidney disease) stage III (HCC)    Bil Renal Ca dx'd 09/2011 & 11/2011   left and right; cryoablation bil   CVA (cerebral vascular accident) ([place])    stroke   Depression    BH Adm in [place] Depression   Diabetes mellitus    DKA prior hospitalization   Diverticulitis    s/p micorperforation Sept 2012-managed conservatively by Gen surgery   ED (erectile dysfunction)    Focal seizure ([place]) 11/2013   due to ischemic stroke   GERD (gastroesophageal reflux disease)    Hiatal hernia    Hyperlipidemia    Hypertension    Kidney tumor 09/2011   Renal cell CA   Seizures ([place])    none since 2016, taking Keppra - maw   Sleep apnea    Thyroid disease    "weak thyroid" per MD   Wears glasses     Past Surgical History:  Procedure Laterality Date   COLONOSCOPY     IR RADIOLOGIST EVAL & MGMT  04/04/2017   KIDNEY SURGERY     ablation of renal cell CA - 12/28, prior one was October [place]   TEE WITHOUT CARDIOVERSION N/A 11/19/2013   Procedure: TRANSESOPHAGEAL ECHOCARDIOGRAM (TEE);  Surgeon: Lelon Perla, MD;  Location: [facility];  Service: Cardiovascular;  Laterality: N/A;    Family History  Problem Relation Age of Onset   Diabetes Father    Heart disease Father    Pneumonia Mother    Prostate cancer Other    Stomach cancer Other    Breast cancer Sister    Colon cancer Neg Hx     Esophageal cancer Neg Hx    Rectal cancer Neg Hx    Social History:  reports that he has never smoked. He has never used smokeless tobacco. He reports previous alcohol use. He reports that he does not use drugs.  Allergies:  Allergies  Allergen Reactions   Morphine Itching    Medications:  No current facility-administered medications on file prior to encounter.   Current Outpatient Medications on File Prior to Encounter  Medication Sig Dispense Refill   amLODipine (NORVASC) 5 MG tablet TAKE 1 TABLET BY MOUTH EVERY DAY 90 tablet 3   aspirin EC 81 MG tablet Take 1 tablet (81 mg total) by mouth daily. (Patient not taking: Reported on 09/18/2020) 90 tablet 3   atorvastatin (LIPITOR) 40 MG tablet TAKE 1 TABLET BY MOUTH EVERY DAY 90 tablet 3   blood glucose meter kit and supplies Dispense based on patient and insurance preference. Use up to four times daily as directed. (FOR ICD-9 250.00, 250.01). 1 each 0   ezetimibe (ZETIA) 10 MG tablet Take 1 tablet (10 mg total) by mouth daily. 90 tablet 3   glucose blood (ACCU-CHEK ACTIVE STRIPS) test strip Use as instructed 100 each 12   glucose blood test strip accu chek active 1-2 times daily 100 each 12   hydrochlorothiazide (MICROZIDE) 12.5 MG capsule TAKE 1 CAPSULE BY MOUTH EVERY DAY IN THE MORNING 90 capsule 0   HYDROcodone-acetaminophen (NORCO) 5-325 MG tablet Take 1 tablet by mouth every 6 (six) hours as needed. 10 tablet 0   HYDROcodone-acetaminophen (NORCO) 7.5-325 MG tablet Take 1 tablet by mouth every 4 (four) hours as needed. 12 tablet 0   insulin aspart (NOVOLOG FLEXPEN) 100 UNIT/ML FlexPen Inject 12 Units into the skin 3 (three) times daily with meals. 45 mL 3   insulin glargine (LANTUS SOLOSTAR) 100 UNIT/ML Solostar Pen Inject 48 Units into the skin daily. 45 pen 3   Lancets (ACCU-CHEK SOFT TOUCH) lancets Use as instructed 100 each 12   levETIRAcetam (KEPPRA) 500 MG tablet TAKE 2 TABLETS (1,000 MG TOTAL) BY MOUTH DAILY. 180 tablet 1    NOVOFINE PLUS PEN NEEDLE 32G X 4 MM MISC 1 EACH BY DOES NOT APPLY ROUTE DAILY. USE FOR INSULIN PENS 100 each 5   omeprazole (PRILOSEC) 40 MG capsule TAKE 1 CAPSULE BY MOUTH EVERY DAY 30 capsule 0   ondansetron (ZOFRAN) 4 MG tablet Take 1 tablet (4 mg total) by mouth every 8 (eight) hours as needed for nausea or vomiting. 20 tablet 0   QUEtiapine (SEROQUEL) 25 MG tablet Take 1 tablet (25 mg total) by mouth daily. 30 tablet 1   Semaglutide, 1 MG/DOSE, (OZEMPIC, 1 MG/DOSE,) 2 MG/1.5ML SOPN Inject 0.75 mLs (1 mg total) into the skin once a week. 3 pen 3   sertraline (  ZOLOFT) 100 MG tablet Take 1 tablet (100 mg total) by mouth daily. 30 tablet 1   sildenafil (VIAGRA) 50 MG tablet Take 1 tablet (50 mg total) by mouth as needed for erectile dysfunction. 10 tablet 0   tamsulosin (FLOMAX) 0.4 MG CAPS capsule Take 1 capsule (0.4 mg total) by mouth daily. (Patient not taking: Reported on 09/18/2020) 30 capsule 11   UNABLE TO FIND Med Name:  Custom diabetic shoes  Dx: Z56.38 1 Device 0     ROS: The patient was unable to provide a ROS due to initial confusion and then belligerence.   Physical Examination: Weight 119.2 kg.  HEENT: [place]/AT Lungs: Respirations unlabored Ext: No edema  Neurologic Examination: Ment: Awake and alert with significantly decreased attention and tangentiality. Speech is with a fluent, word-salad quality, with formed phrases that do not pertain to the situation or questions asked. The patient appears confused, is mildly agitated and is with poor eye contact. The patient is unable to correctly answer any orientation questions in this context. Some phonemic paraphasias are noted. He attempts to cooperate. He does not follow most commands, but will sit up on CT table when asked. .  CN: PERRL. EOMI. No nystagmus. EOMI without gaze preference. No facial droop. Phonation intact. Head is midline.  Motor: Moves all 4 extremities with full strength but does not follow commands. No asymmetry  noted. No jerking or twitching appreciated.  Sensory: Reacts to tactile stimuli x 4.  Reflexes: 1+ bilateral brachioradialis and biceps. 1+ bilateral patellae. 0 bilateral achilles.  Cerebellar: Does not follow commands for testing. No gross ataxia noted.  Gait: Deferred due to falls risk concerns. No truncal ataxia in seated position.   Following CT, the patient is able to speak in fluent sentences more pertinent to situation but becomes agitated, frequently uttering complete, intelligible and nondysarthric phrases exclaiming his displeasure with the assessments and attempts to place leads, BP cuffs and IVs. He continues to have evidence for decreased overall awareness of the situation, but seems to realize that he is in a healthcare setting.    Results for orders placed or performed during the hospital encounter of 07/04/21 (from the past 48 hour(s))  CBG monitoring, ED     Status: Abnormal   Collection Time: 07/04/21  6:40 PM  Result Value Ref Range   Glucose-Capillary >600 (HH) 70 - 99 mg/dL    Comment: Glucose reference range applies only to samples taken after fasting for at least 8 hours.  Protime-INR     Status: Abnormal   Collection Time: 07/04/21  6:44 PM  Result Value Ref Range   Prothrombin Time 15.9 (H) 11.4 - 15.2 seconds   INR 1.3 (H) 0.8 - 1.2    Comment: (NOTE) INR goal varies based on device and disease states. Performed at [facility], [place] [address], [place], [place] [address]   APTT     Status: None   Collection Time: 07/04/21  6:44 PM  Result Value Ref Range   aPTT 32 24 - 36 seconds    Comment: Performed at [facility] [address] [place] [address]  CBC     Status: Abnormal   Collection Time: 07/04/21  6:44 PM  Result Value Ref Range   WBC 5.9 4.0 - 10.5 K/uL   RBC 3.75 (L) 4.22 - 5.81 MIL/uL   Hemoglobin 11.2 (L) 13.0 - 17.0 g/dL   HCT 32.2 (L) 39.0 - 52.0 %   MCV 85.9 80.0 -  100.0 fL   MCH 29.9 26.0 - 34.0 pg   MCHC 34.8 30.0  - 36.0 g/dL   RDW 11.4 (L) 11.5 - 15.5 %   Platelets 215 150 - 400 K/uL   nRBC 0.0 0.0 - 0.2 %    Comment: Performed at [facility] [address], [place], [place] [address]  Differential     Status: None   Collection Time: 07/04/21  6:44 PM  Result Value Ref Range   Neutrophils Relative % 59 %   Neutro Abs 3.5 1.7 - 7.7 K/uL   Lymphocytes Relative 30 %   Lymphs Abs 1.8 0.7 - 4.0 K/uL   Monocytes Relative 8 %   Monocytes Absolute 0.5 0.1 - 1.0 K/uL   Eosinophils Relative 2 %   Eosinophils Absolute 0.1 0.0 - 0.5 K/uL   Basophils Relative 1 %   Basophils Absolute 0.1 0.0 - 0.1 K/uL   Immature Granulocytes 0 %   Abs Immature Granulocytes 0.01 0.00 - 0.07 K/uL    Comment: Performed at [facility], [place] [address] [place], [place] [address]  I-stat chem 8, ED     Status: Abnormal   Collection Time: 07/04/21  6:46 PM  Result Value Ref Range   Sodium 133 (L) 135 - 145 mmol/L   Potassium 3.9 3.5 - 5.1 mmol/L   Chloride 99 98 - 111 mmol/L   BUN 23 (H) 6 - 20 mg/dL   Creatinine, Ser 2.40 (H) 0.61 - 1.24 mg/dL   Glucose, Bld >700 (HH) 70 - 99 mg/dL    Comment: Glucose reference range applies only to samples taken after fasting for at least 8 hours.   Calcium, Ion 1.12 (L) 1.15 - 1.40 mmol/L   TCO2 23 22 - 32 mmol/L   Hemoglobin 11.6 (L) 13.0 - 17.0 g/dL   HCT 34.0 (L) 39.0 - 52.0 %   Comment NOTIFIED PHYSICIAN    CT HEAD CODE STROKE WO CONTRAST  Result Date: 07/04/2021 CLINICAL DATA:  Code stroke. Neuro deficit, acute, stroke suspected. Additional history provided: Isolated expressive aphasia. EXAM: CT HEAD WITHOUT CONTRAST TECHNIQUE: Contiguous axial images were obtained from the base of the skull through the vertex without intravenous contrast. COMPARISON:  Prior head CT examinations 07/15/2020 and earlier. Brain MRI 10/18/2016. FINDINGS: Brain: Redemonstrated large chronic right MCA territory cortical/subcortical infarct predominantly affecting the right parietal and  temporal lobes. Within limitations of motion degradation, there is no appreciable acute intracranial hemorrhage or acute demarcated cortical infarction. No intracranial mass or extra-axial fluid collection is identified. No midline shift Vascular: No appreciable hyperdense vessel. Atherosclerotic calcifications. Skull: Normal. Negative for fracture or focal lesion. Sinuses/Orbits: Visualized orbits show no acute finding. No significant paranasal sinus disease at the imaged levels. ASPECTS [facility] Stroke Program Early CT Score) - Ganglionic level infarction (caudate, lentiform nuclei, internal capsule, insula, M1-M3 cortex): 7 - Supraganglionic infarction (M4-M6 cortex): 3 Total score (0-10 with 10 being normal): 10 (when discounting chronic infarcts). These results were communicated to Dr. Cheral Marker At 7:01 pmon 7/31/2022by text page via the [facility] messaging system. IMPRESSION: Significantly motion degraded examination, limiting evaluation. No appreciable acute intracranial abnormality. Redemonstrated large chronic right MCA territory cortical/subcortical infarct, predominantly affecting the right parietal and temporal lobes. Electronically Signed   By: Kellie Simmering DO   On: 07/04/2021 19:01    Assessment: 60 y.o. male presenting with acute onset of confusion, agitation and garbled speech in the context of severe hyperglycemia and malignant hypertension.  1. Exam findings  most consistent with diffuse cerebral dysfunction, most likely secondary to an agitated delirium. No findings consistent with a focal lesional aphasia after CT, but findings prior to CT could be secondary to a Wernicke's aphasia. No adventitious movements suggestive of seizure activity; however, given his history of seizures, subclinical seizure activity and postictal state are on the DDx. Possible toxic encephalopathy if substances of abuse are involved. EtOH withdrawal possible but felt to be relatively unlikely.  2. CT head: Significantly  motion degraded examination, limiting evaluation. No appreciable acute intracranial abnormality. Redemonstrated large chronic right MCA territory cortical/subcortical infarct, predominantly affecting the right parietal and temporal lobes. 3. Severely elevated blood glucose level. DDx DKA versus HHS.  4. Stroke Risk Factors -  Prior stroke, CKD, DM, prior hospitalization for DKA, HLD, HTN, DM, DKA and focal seizures (on Keppra) 5. Severe HTN with SBP in low 200s. Hypertensive encephalopathy is high on the DDx.   Recommendations: 1. EEG (ordered) 2. MRI brain 3. EtOH level (ordered) 4. Tox screen 5. Continue ASA and atorvastatin.  6. Continue home Keppra, but change from 1000 mg po qd to 500 mg IV BID 7. Keppra level (ordered) 8. Management of severe hyperglycemia per ED team.  9. HgbA1c, fasting lipid panel 10. Telemetry monitoring 11. Frequent neuro checks 12. Thiamine level, then start 500 mg IV TID x 3 days (both have been ordered)  13. Psychiatry consult.  14. Continue Seroquel. 15. BP management with SBP goal < 180.  16. B12, TSH and ammonia levels (ordered).   _0  signed: Dr. Kerney Elbe  07/04/2021, 7:10 PM

## 2021-07-05 ENCOUNTER — Inpatient Hospital Stay (HOSPITAL_COMMUNITY): Payer: Medicare Other

## 2021-07-05 DIAGNOSIS — E1100 Type 2 diabetes mellitus with hyperosmolarity without nonketotic hyperglycemic-hyperosmolar coma (NKHHC): Secondary | ICD-10-CM | POA: Diagnosis not present

## 2021-07-05 DIAGNOSIS — N179 Acute kidney failure, unspecified: Secondary | ICD-10-CM

## 2021-07-05 DIAGNOSIS — N183 Chronic kidney disease, stage 3 unspecified: Secondary | ICD-10-CM

## 2021-07-05 DIAGNOSIS — E1165 Type 2 diabetes mellitus with hyperglycemia: Secondary | ICD-10-CM

## 2021-07-05 DIAGNOSIS — I674 Hypertensive encephalopathy: Secondary | ICD-10-CM | POA: Diagnosis not present

## 2021-07-05 DIAGNOSIS — I161 Hypertensive emergency: Secondary | ICD-10-CM | POA: Diagnosis not present

## 2021-07-05 DIAGNOSIS — G9341 Metabolic encephalopathy: Secondary | ICD-10-CM | POA: Diagnosis not present

## 2021-07-05 DIAGNOSIS — G9340 Encephalopathy, unspecified: Secondary | ICD-10-CM

## 2021-07-05 DIAGNOSIS — R410 Disorientation, unspecified: Secondary | ICD-10-CM

## 2021-07-05 LAB — PHOSPHORUS
Phosphorus: 2.4 mg/dL — ABNORMAL LOW (ref 2.5–4.6)
Phosphorus: 4 mg/dL (ref 2.5–4.6)

## 2021-07-05 LAB — BASIC METABOLIC PANEL
Anion gap: 9 (ref 5–15)
Anion gap: 7 (ref 5–15)
Anion gap: 7 (ref 5–15)
BUN: 17 mg/dL (ref 6–20)
BUN: 17 mg/dL (ref 6–20)
BUN: 14 mg/dL (ref 6–20)
CO2: 22 mmol/L (ref 22–32)
CO2: 26 mmol/L (ref 22–32)
CO2: 21 mmol/L — ABNORMAL LOW (ref 22–32)
Calcium: 8.9 mg/dL (ref 8.9–10.3)
Calcium: 8.9 mg/dL (ref 8.9–10.3)
Calcium: 9 mg/dL (ref 8.9–10.3)
Chloride: 106 mmol/L (ref 98–111)
Chloride: 105 mmol/L (ref 98–111)
Chloride: 107 mmol/L (ref 98–111)
Creatinine, Ser: 1.88 mg/dL — ABNORMAL HIGH (ref 0.61–1.24)
Creatinine, Ser: 2.02 mg/dL — ABNORMAL HIGH (ref 0.61–1.24)
Creatinine, Ser: 1.79 mg/dL — ABNORMAL HIGH (ref 0.61–1.24)
GFR, Estimated: 41 mL/min — ABNORMAL LOW (ref >60)
GFR, Estimated: 38 mL/min — ABNORMAL LOW (ref >60)
GFR, Estimated: 43 mL/min — ABNORMAL LOW (ref >60)
Glucose, Bld: 213 mg/dL — ABNORMAL HIGH (ref 70–99)
Glucose, Bld: 182 mg/dL — ABNORMAL HIGH (ref 70–99)
Glucose, Bld: 158 mg/dL — ABNORMAL HIGH (ref 70–99)
Potassium: 3.1 mmol/L — ABNORMAL LOW (ref 3.5–5.1)
Potassium: 3.6 mmol/L (ref 3.5–5.1)
Potassium: 4.2 mmol/L (ref 3.5–5.1)
Sodium: 137 mmol/L (ref 135–145)
Sodium: 138 mmol/L (ref 135–145)
Sodium: 135 mmol/L (ref 135–145)

## 2021-07-05 LAB — RAPID URINE DRUG SCREEN, HOSP PERFORMED
Amphetamines: NOT DETECTED
Barbiturates: NOT DETECTED
Benzodiazepines: POSITIVE — AB
Cocaine: NOT DETECTED
Opiates: NOT DETECTED
Tetrahydrocannabinol: NOT DETECTED

## 2021-07-05 LAB — ETHANOL: Alcohol, Ethyl (B): <10 mg/dL (ref <10)

## 2021-07-05 LAB — RESP PANEL BY RT-PCR (FLU A&B, COVID) ARPGX2
Influenza A by PCR: NEGATIVE
Influenza B by PCR: NEGATIVE
SARS Coronavirus 2 by RT PCR: NEGATIVE

## 2021-07-05 LAB — GLUCOSE, CAPILLARY
Glucose-Capillary: 245 mg/dL — ABNORMAL HIGH (ref 70–99)
Glucose-Capillary: 244 mg/dL — ABNORMAL HIGH (ref 70–99)
Glucose-Capillary: 232 mg/dL — ABNORMAL HIGH (ref 70–99)
Glucose-Capillary: 225 mg/dL — ABNORMAL HIGH (ref 70–99)
Glucose-Capillary: 185 mg/dL — ABNORMAL HIGH (ref 70–99)
Glucose-Capillary: 150 mg/dL — ABNORMAL HIGH (ref 70–99)
Glucose-Capillary: 147 mg/dL — ABNORMAL HIGH (ref 70–99)
Glucose-Capillary: 174 mg/dL — ABNORMAL HIGH (ref 70–99)
Glucose-Capillary: 248 mg/dL — ABNORMAL HIGH (ref 70–99)
Glucose-Capillary: 174 mg/dL — ABNORMAL HIGH (ref 70–99)
Glucose-Capillary: 173 mg/dL — ABNORMAL HIGH (ref 70–99)
Glucose-Capillary: 209 mg/dL — ABNORMAL HIGH (ref 70–99)
Glucose-Capillary: 231 mg/dL — ABNORMAL HIGH (ref 70–99)
Glucose-Capillary: 173 mg/dL — ABNORMAL HIGH (ref 70–99)

## 2021-07-05 LAB — HIV ANTIBODY (ROUTINE TESTING W REFLEX): HIV Screen 4th Generation wRfx: NONREACTIVE

## 2021-07-05 LAB — URINALYSIS, ROUTINE W REFLEX MICROSCOPIC
Bacteria, UA: NONE SEEN
Bilirubin Urine: NEGATIVE
Glucose, UA: >=500 mg/dL — AB
Ketones, ur: NEGATIVE mg/dL
Leukocytes,Ua: NEGATIVE
Nitrite: NEGATIVE
Protein, ur: >=300 mg/dL — AB
Specific Gravity, Urine: 1.009 (ref 1.005–1.030)
pH: 9 — ABNORMAL HIGH (ref 5.0–8.0)

## 2021-07-05 LAB — TSH: TSH: 0.89 u[IU]/mL (ref 0.350–4.500)

## 2021-07-05 LAB — MRSA NEXT GEN BY PCR, NASAL: MRSA by PCR Next Gen: NOT DETECTED

## 2021-07-05 LAB — BETA-HYDROXYBUTYRIC ACID
Beta-Hydroxybutyric Acid: 0.05 mmol/L (ref 0.05–0.27)
Beta-Hydroxybutyric Acid: 0.1 mmol/L (ref 0.05–0.27)

## 2021-07-05 LAB — VITAMIN B12: Vitamin B-12: 777 pg/mL (ref 180–914)

## 2021-07-05 LAB — MAGNESIUM: Magnesium: 1.6 mg/dL — ABNORMAL LOW (ref 1.7–2.4)

## 2021-07-05 MED ORDER — SODIUM CHLORIDE 0.9 % IV SOLN: INTRAVENOUS | Status: DC | PRN

## 2021-07-05 MED ORDER — ORAL CARE MOUTH RINSE
15.0000 mL | Freq: Two times a day (BID) | OROMUCOSAL | Status: DC
  Administered 2021-07-05 – 2021-07-09 (×8): 15 mL via OROMUCOSAL

## 2021-07-05 MED ORDER — LEVETIRACETAM 500 MG PO TABS
1000.0000 mg | ORAL_TABLET | Freq: Every day | ORAL | Status: DC
  Administered 2021-07-06 – 2021-07-10 (×5): 1000 mg via ORAL
  Filled 2021-07-05 (×5): qty 2

## 2021-07-05 MED ORDER — CHLORHEXIDINE GLUCONATE 0.12 % MT SOLN
15.0000 mL | Freq: Two times a day (BID) | OROMUCOSAL | Status: DC
  Administered 2021-07-05 – 2021-07-09 (×7): 15 mL via OROMUCOSAL
  Filled 2021-07-05 (×9): qty 15

## 2021-07-05 MED ORDER — POTASSIUM PHOSPHATES 15 MMOLE/5ML IV SOLN
45.0000 mmol | Freq: Once | INTRAVENOUS | Status: DC
  Filled 2021-07-05: qty 15

## 2021-07-05 MED ORDER — MAGNESIUM SULFATE 4 GM/100ML IV SOLN
4.0000 g | Freq: Once | INTRAVENOUS | Status: AC
  Administered 2021-07-05: 4 g via INTRAVENOUS
  Filled 2021-07-05: qty 100

## 2021-07-05 MED ORDER — INSULIN DETEMIR 100 UNIT/ML ~~LOC~~ SOLN
10.0000 [IU] | Freq: Two times a day (BID) | SUBCUTANEOUS | Status: DC
  Administered 2021-07-05 (×2): 10 [IU] via SUBCUTANEOUS
  Filled 2021-07-05 (×4): qty 0.1

## 2021-07-05 MED ORDER — LEVETIRACETAM IN NACL 1000 MG/100ML IV SOLN
1000.0000 mg | Freq: Once | INTRAVENOUS | Status: AC
  Administered 2021-07-05: 1000 mg via INTRAVENOUS
  Filled 2021-07-05: qty 100

## 2021-07-05 MED ORDER — LABETALOL HCL 5 MG/ML IV SOLN
10.0000 mg | INTRAVENOUS | Status: DC | PRN
  Administered 2021-07-05: 10 mg via INTRAVENOUS
  Filled 2021-07-05: qty 4

## 2021-07-05 MED ORDER — POTASSIUM PHOSPHATES 15 MMOLE/5ML IV SOLN
30.0000 mmol | Freq: Once | INTRAVENOUS | Status: AC
  Administered 2021-07-05: 30 mmol via INTRAVENOUS
  Filled 2021-07-05: qty 10

## 2021-07-05 MED ORDER — INSULIN ASPART 100 UNIT/ML IJ SOLN
2.0000 [IU] | INTRAMUSCULAR | Status: DC
  Administered 2021-07-05: 6 [IU] via SUBCUTANEOUS
  Administered 2021-07-05: 4 [IU] via SUBCUTANEOUS
  Administered 2021-07-05: 6 [IU] via SUBCUTANEOUS
  Administered 2021-07-06: 4 [IU] via SUBCUTANEOUS
  Administered 2021-07-06: 2 [IU] via SUBCUTANEOUS

## 2021-07-05 MED ORDER — POTASSIUM CHLORIDE 10 MEQ/100ML IV SOLN
10.0000 meq | INTRAVENOUS | Status: AC
  Administered 2021-07-05 (×6): 10 meq via INTRAVENOUS
  Filled 2021-07-05 (×6): qty 100

## 2021-07-05 MED ORDER — CHLORHEXIDINE GLUCONATE CLOTH 2 % EX PADS
6.0000 | MEDICATED_PAD | Freq: Every day | CUTANEOUS | Status: DC
  Administered 2021-07-05 – 2021-07-09 (×5): 6 via TOPICAL

## 2021-07-05 MED ORDER — POTASSIUM CHLORIDE 10 MEQ/100ML IV SOLN: 10.0000 meq | INTRAVENOUS | Status: DC

## 2021-07-05 NOTE — Plan of Care (Signed)
Too agitated and not cooperating for MRI. MRI can wait till acute medical conditions are dealt with.  -- Amie Portland, MD Neurologist [facility] Pager: [phone]

## 2021-07-05 NOTE — Progress Notes (Signed)
NAME:  Brandon [name], MRN:  [id number], DOB:  Apr 24, 1961, LOS: 1 ADMISSION DATE:  07/04/2021, CONSULTATION DATE:  07/05/21 REFERRING MD: EDP , CHIEF COMPLAINT:  AMS  History of Present Illness:  60 y/o M with past medical history significant for IDDM, schizophrenia on Quetiapine, HTN, CVA with residual L-sided deficits, stage III CKD, seizure disorder, OSAn  who presented to the ED with SO for increasing agitation and altered mental status.  He reportedly stopped taking his medications sometime recently, his significant other states that at baseline he has frequent hallucinations and paranoid thoughts secondary to PTSD and periodically stops taking his medications and ends up hospitalized.    In the ED he was initially a code stroke, initial head CT was without acute findings and he was found to be significantly hypertensive with BP as high as 230/133, glucose >800 without anion gap and normal pH, and creatinine 2.5 (baseline 1.7-2.4).  He was given Ativan and Geodon and still had significant agitation, as well as hydralazine pushes for BP and remained hypertensive, therefore PCCM was consulted.  His partner states that he occasionally drinks alcohol, but she does not think this is daily and he has tested positive for street drugs in the past, but she has not seen any recent evidence of use.  Denies recent fevers, chills, illnesses or falls.  Pertinent  Medical History   has a past medical history of Acute ischemic stroke ([place]) (11/2013), Allergy, Anxiety, Arthritis, Benign hypertension with CKD (chronic kidney disease) stage III ([place]), Bil Renal Ca (dx'd 09/2011 & 11/2011), CVA (cerebral vascular accident) ([place]), Depression, Diabetes mellitus, Diverticulitis, ED (erectile dysfunction), Focal seizure ([place]) (11/2013), GERD (gastroesophageal reflux disease), Hiatal hernia, Hyperlipidemia, Hypertension, Kidney tumor (09/2011), Seizures ([place]), Sleep apnea, Thyroid disease, and Wears glasses.  Significant  Hospital Events: Including procedures, antibiotic start and stop dates in addition to other pertinent events   7/31 Admit to PCCM, negative head CT. Started on insulin gtt.  Interim History / Subjective:  Precedex turned off after shift change, still fatigued.   Objective   Blood pressure (!) 158/106, pulse (!) 58, temperature 97.8 F (36.6 C), temperature source Oral, resp. rate (!) 24, weight 117.5 kg, SpO2 97 %.        Intake/Output Summary (Last 24 hours) at 07/05/2021 0720 Last data filed at 07/05/2021 0600 Gross per 24 hour  Intake 3870.77 ml  Output 200 ml  Net 3670.77 ml    Filed Weights   07/04/21 1845 07/05/21 0027 07/05/21 0101  Weight: 119.2 kg 117.5 kg 117.5 kg    General:  ill appearing man lying in bed in NAD, fatigued appearing  HEENT: [place]/AT, eyes anicteric Neuro: sleepy, arouses to stimulation, not oriented. Not able to follow commands, but moving all extremities spontaneously. Falls back asleep quickly. CV: S1S2, RRR PULM: breathing comfortable on RA, CTAB. GI: soft, ND Extremities: no significant edema, no cyanosis or clubbing Skin: warm, no rashes  CXR personally reviewed> low lung volumes  Resolved Hospital Problem list     Assessment & Plan:    Acute metabolic encephalopathy  Superimposed on baseline schizophrenia and PTSD, POA Suspect this is likely multi-factorial due to HHS, stopping Seroquel, along with hypertensive urgency. PRES possible-- need MRI to confirm. P: -Needs ongoing ICU monitoring, keep precedex off for now. Can resume if needed. -MRI brain when patient will tolerate to eval for acute CVA or PRES -EEG pending -optimize BP and BG; probably coming off insulin infusion today -goal SBP ~180 until neurologically more  normal -thiamine daily -NPO until able to swallow   Hypertensive Emergency- stopped home medications HCTZ and Norvasc for unknown amount of time P: -can resume some home meds when abek to take PO   HHS Glucose  >800 without anion gap or acidemia  P: -insulin infusion with endotool -monitor and replete electrolytes -ongoing volume repletion  Hypokalemia Hypomagnesemia Hypophosphatemia -repletion -monitor   Seizure Disorder -Check spot EEG in the setting AMS and not taking his medications -con't  home Keppra IV until able to take PO  HLD -continue statin once able to take PO  AKI, likely prerenal; back to baseline CKD 3B -monitor renal function -strict I/Os -renally dose meds, avoid nephrotoxic meds.    Best Practice (right click and "Reselect all SmartList Selections" daily)   Diet/type: NPO DVT prophylaxis: systemic heparin GI prophylaxis: N/A Lines: N/A Foley:  N/A Code Status:  full code Last date of multidisciplinary goals of care discussion [Code status confirmed with SO 7/31]  Labs   CBC: Recent Labs  Lab 07/04/21 1844 07/04/21 1846 07/04/21 1954 07/04/21 2254  WBC 5.9  --   --  8.9  NEUTROABS 3.5  --   --   --   HGB 11.2* 11.6* 12.2* 12.6*  HCT 32.2* 34.0* 36.0* 36.7*  MCV 85.9  --   --  86.2  PLT 215  --   --  203     Basic Metabolic Panel: Recent Labs  Lab 07/04/21 1844 07/04/21 1846 07/04/21 1916 07/04/21 1954 07/04/21 2254 07/05/21 0419  NA 130* 133* 132* 134* 136 137  K 3.8 3.9 3.8 3.7 3.3* 3.1*  CL 98 99 100  --  105 106  CO2 23  --  22  --  22 22  GLUCOSE 800* >700* 773*  --  322* 213*  BUN 21* 23* 21*  --  18 17  CREATININE 2.51* 2.40* 2.44*  --  2.19* 1.88*  CALCIUM 8.7*  --  8.9  --  9.2 8.9  MG  --   --   --   --   --  1.6*  PHOS  --   --   --   --   --  2.4*    GFR: CrCl cannot be calculated (Unknown ideal weight.).    This patient is critically ill with multiple organ system failure which requires frequent high complexity decision making, assessment, support, evaluation, and titration of therapies. This was completed through the application of advanced monitoring technologies and extensive interpretation of multiple  databases. During this encounter critical care time was devoted to patient care services described in this note for 34 minutes.  Julian Hy, DO 07/05/21 7:41 AM [facility]

## 2021-07-05 NOTE — Progress Notes (Addendum)
NEUROLOGY CONSULTATION PROGRESS NOTE   Date of service: July 05, 2021 Patient Name: Brandon [name] MRN:  [id number] DOB:  Dec 26, 1960  Brief HPI  Brandon [name] is a 60 y.o. male with PMH significant for PMHx of stroke in 2013, anxiety, PTSD, CKD III, bilateral renal cancer, depression, DM, prior hospitalization for DKA, HLD, HTN, DM, DKA and focal seizures (on Keppra), who presents acutely via EMS after sudden onset of confusion, agitation and garbled speech in the context of severe hyperglycemia and malignant hypertension.  Workup with CT head with large chronic right MCA cortical/subcortical infarct predominantly affecting the right parietal and temporal lobes.  Severely elevated glucose to greater than 700 on presentation.  Severe hypertension with SBP over 200s.   Interval Hx   Being transitioned off insulin gtt. Fianc at bedside endorses the patient will miss some of his Keppra doses.  Still somnolent and in soft restraints for periodic agitation but will wake up briefly and follow commands.  Vitals   Vitals:   07/05/21 0630 07/05/21 0700 07/05/21 0745 07/05/21 0800  BP: (!) 158/106   (!) 184/109  Pulse:    81  Resp:    19  Temp:   (!) 97.5 F (36.4 C)   TempSrc:   Oral   SpO2:    100%  Weight:      Height:  6\' 1"  (1.854 m)       Body mass index is 34.18 kg/m.  Physical Exam   General: Laying comfortably in bed; in no acute distress. HENT: Normal oropharynx and mucosa. Normal external appearance of ears and nose. Neck: Supple, no pain or tenderness CV: No JVD. No peripheral edema. Pulmonary: Symmetric Chest rise. Normal respiratory effort. Abdomen: Soft to touch, non-tender. Ext: No cyanosis, edema, or deformity Skin: No rash. Normal palpation of skin.  Musculoskeletal: Normal digits and nails by inspection. No clubbing.  Neurologic Examination  Mental status/Cognition: somnolent, oriented to self. Opens eye briefly to tactile stimulation and will follow one  step commands before going back to sleep. Speech/language: Non fluent and speaks in short phrases likely due to somnolence, comprehension intact to simple commands, can name thumb.  Cranial nerves:   CN II Pupils equal and reactive to light, no VF deficits   CN III,IV,VI Does make brief eye contact on both sides.   CN V    CN VII no asymmetry, no nasolabial fold flattening   CN VIII Turns head towards speech.   CN IX & X normal palatal elevation, no uvular deviation   CN XI    CN XII midline tongue protrusion   Motor:  Muscle bulk: normal, tone normal \\Unable  to do detailed strength testing due to periodic agitation and somnolence. He is also in restraints. Gives a thumbs up to me on both sides and lifts BL legs off the bed on command.  Coordination/Complex Motor:  Unable to assess.  Labs   Basic Metabolic Panel:  Lab Results  Component Value Date   NA 138 07/05/2021   K 3.6 07/05/2021   CO2 26 07/05/2021   GLUCOSE 182 (H) 07/05/2021   BUN 17 07/05/2021   CREATININE 2.02 (H) 07/05/2021   CALCIUM 8.9 07/05/2021   GFRNONAA 38 (L) 07/05/2021   GFRAA 44 (L) 09/18/2020   HbA1c:  Lab Results  Component Value Date   HGBA1C 14.2 (H) 04/05/2020   LDL:  Lab Results  Component Value Date   LDLCALC 107 (H) 01/09/2020   Urine Drug Screen:  Component Value Date/Time   LABOPIA NONE DETECTED 07/05/2021 0035   COCAINSCRNUR NONE DETECTED 07/05/2021 0035   LABBENZ POSITIVE (A) 07/05/2021 0035   AMPHETMU NONE DETECTED 07/05/2021 0035   THCU NONE DETECTED 07/05/2021 0035   LABBARB NONE DETECTED 07/05/2021 0035    Alcohol Level     Component Value Date/Time   ETH <10 07/05/2021 0419   Lab Results  Component Value Date   LEVETIRACETA 15.2 03/01/2018   No results found for: PHENYTOIN, PHENOBARB, VALPROATE, CBMZ  Imaging and Diagnostic studies  Results for orders placed during the hospital encounter of 07/04/21  CT HEAD CODE STROKE WO CONTRAST Personally reviewed and  redemonstrated large chronic right MCA territory cortical/subcortical infarct, predominantly affecting the right parietal and temporal lobes.    Impression   Brandon [name] is a 60 y.o. male with PMHx of stroke in 2013, anxiety, PTSD, CKD III, bilateral renal cancer, depression, DM, prior hospitalization for DKA, HLD, HTN, DM, DKA and focal seizures (on Keppra), who presents acutely via EMS after sudden onset of confusion, agitation and garbled speech in the context of severe hyperglycemia and malignant hypertension. Presentation likely explained by hyperglycemia and hypertension. Also skips some doses of his keppra per his fiance.  Recommendations  - Continue Asa and Atorvastatin - Continue home Keppra 500mg  BID. Can transition to PO when able to tolerate PO. - Continue high dose thiamine replacement. Can transition to thiamine PO 100mg  daily at discharge. - Hyperglycemia significantly improved. - BP Goal--> Aim for gradual normotension - B12, TSH, Ammonia levels are normal. - MRI Brain w/o contrast when able to tolerate. - routine EEG is pending. ______________________________________________________________________   Thank you for the opportunity to take part in the care of this patient. If you have any further questions, please contact the neurology consultation attending.  Signed,  [place] Pager Number [phone]

## 2021-07-05 NOTE — Procedures (Signed)
Patient Name: Brandon [name]  MRN: [id number]  Epilepsy Attending: Lora Havens  Referring Physician/Provider: Dr Kerney Elbe Date: 07/05/2021 Duration: 23.09 mins  Patient history: 60 y.o. male with PMHx of stroke in 2013, anxiety, PTSD, CKD III, bilateral renal cancer, depression, DM, prior hospitalization for DKA, HLD, HTN, DM, DKA and focal seizures (on Keppra), who presents acutely via EMS after sudden onset of confusion, agitation and garbled speech in the context of severe hyperglycemia and malignant hypertension.  EEG to evaluate for seizure  Level of alertness: Awake, asleep  AEDs during EEG study: LEV  Technical aspects: This EEG study was done with scalp electrodes positioned according to the 10-20 International system of electrode placement. Electrical activity was acquired at a sampling rate of 500Hz  and reviewed with a high frequency filter of 70Hz  and a low frequency filter of 1Hz . EEG data were recorded continuously and digitally stored.   Description: No clear posterior dominant rhythm was seen. Sleep was characterized by vertex waves, sleep spindles (12 to 14 Hz), maximal frontocentral region. EEG showed continuous generalized 3 to 6 Hz theta-delta slowing. Hyperventilation and photic stimulation were not performed.     ABNORMALITY - Continuous slow, generalized  IMPRESSION: This study is suggestive of moderate diffuse encephalopathy, nonspecific etiology. No seizures or epileptiform discharges were seen throughout the recording.  Jerek Meulemans Barbra Sarks

## 2021-07-05 NOTE — Progress Notes (Signed)
Inpatient Diabetes Program Recommendations  AACE/ADA: New Consensus Statement on Inpatient Glycemic Control (2015)  Target Ranges:  Prepandial:   less than 140 mg/dL      Peak postprandial:   less than 180 mg/dL (1-2 hours)      Critically ill patients:  140 - 180 mg/dL   Lab Results  Component Value Date   GLUCAP 174 (H) 07/05/2021   HGBA1C 14.2 (H) 04/05/2020    Review of Glycemic Control Results for Brandon [name], Brandon [name] (MRN [id number]) as of 07/05/2021 11:21  Ref. Range 07/05/2021 05:31 07/05/2021 06:31 07/05/2021 07:31 07/05/2021 08:25 07/05/2021 10:12  Glucose-Capillary Latest Ref Range: 70 - 99 mg/dL 185 (H) 150 (H) 147 (H) 174 (H) 174 (H)   Diabetes history: DM 2 Outpatient Diabetes medications:  Lantus 48 units daily, Novolog 12 units tid with meals Current orders for Inpatient glycemic control:  Levemir 10 units bid Novolog 2-6 units q 4 hours Inpatient Diabetes Program Recommendations:   Note patient transitioned off insulin drip this AM.   Please consider adding A1C to labs. Will follow.   Thanks,  Adah Perl, RN, BC-ADM Inpatient Diabetes Coordinator Pager [phone]  (8a-5p)

## 2021-07-05 NOTE — Progress Notes (Signed)
EEG Completed; Results Pending  

## 2021-07-05 NOTE — Progress Notes (Signed)
[facility] Progress Note Patient Name: Brandon [name] DOB: 1961-11-02 MRN: [id number]   Date of Service  07/05/2021  HPI/Events of Note  64 M history of schizophrenia, hypertension, DM brought in after developing altered sensorium at the grocery. Agitated on arrival to the ED. Found to be hypertensive and hyperglycemic. Likely from non compliance to medication including psych medications.  Seen asleep, withdraws to pain  eICU Interventions  Started on nicardipine drip which is now off On insulin drip being titrated Precedex drip for agitation, titrate to effect     Intervention Category Major Interventions: Change in mental status - evaluation and management;Hyperglycemia - active titration of insulin therapy;Hypertension - evaluation and management Evaluation Type: New Patient Evaluation  Judd Lien 07/05/2021, 12:40 AM

## 2021-07-05 NOTE — Plan of Care (Signed)
  Problem: Clinical Measurements: Goal: Diagnostic test results will improve Outcome: Progressing Goal: Respiratory complications will improve Outcome: Progressing   Problem: Elimination: Goal: Will not experience complications related to urinary retention Outcome: Progressing   Problem: Skin Integrity: Goal: Risk for impaired skin integrity will decrease Outcome: Progressing   Problem: Nutrition: Goal: Adequate nutrition will be maintained Outcome: Not Progressing Note: Currently NPO due to sleepiness and lethargy. Precedex stopped this morning. Will advance diet as LOC allows.

## 2021-07-05 NOTE — Progress Notes (Signed)
Fiancee updated at bedside. Transitioning off insulin infusion per endotool recommendations. Will do bedside swallow when more awake.  Julian Hy, DO 07/05/21 8:57 AM [facility]

## 2021-07-05 NOTE — Progress Notes (Addendum)
[facility] ADULT ICU REPLACEMENT PROTOCOL   The patient does apply for the [facility] Adult ICU Electrolyte Replacment Protocol based on the criteria listed below:   1.Exclusion criteria: TCTS patients, ECMO patients and Hypothermia Protocol, and   Dialysis patients 2. Is GFR >/= 30 ml/min? Yes.    Patient's GFR today is 41 3. Is SCr </= 2? No. Patient's SCr is 1.88 mg/dL 4. Did SCr increase >/= 0.5 in 24 hours? No. 5.Pt's weight >40kg  Yes.   6. Abnormal electrolyte(s): K+ 3.1,mag 1.6  7. Electrolytes replaced per protocol 8.  Call MD STAT for K+ </= 2.5, Phos </= 1, or Mag </= 1 Physician:  n/a  Darlys Gales 07/05/2021 6:19 AM ,

## 2021-07-06 DIAGNOSIS — R739 Hyperglycemia, unspecified: Secondary | ICD-10-CM | POA: Diagnosis not present

## 2021-07-06 DIAGNOSIS — I674 Hypertensive encephalopathy: Secondary | ICD-10-CM | POA: Diagnosis not present

## 2021-07-06 DIAGNOSIS — I161 Hypertensive emergency: Secondary | ICD-10-CM

## 2021-07-06 DIAGNOSIS — N179 Acute kidney failure, unspecified: Secondary | ICD-10-CM | POA: Diagnosis not present

## 2021-07-06 LAB — BASIC METABOLIC PANEL
Anion gap: 9 (ref 5–15)
BUN: 13 mg/dL (ref 6–20)
CO2: 23 mmol/L (ref 22–32)
Calcium: 8.6 mg/dL — ABNORMAL LOW (ref 8.9–10.3)
Chloride: 104 mmol/L (ref 98–111)
Creatinine, Ser: 2.05 mg/dL — ABNORMAL HIGH (ref 0.61–1.24)
GFR, Estimated: 37 mL/min — ABNORMAL LOW (ref >60)
Glucose, Bld: 184 mg/dL — ABNORMAL HIGH (ref 70–99)
Potassium: 3.4 mmol/L — ABNORMAL LOW (ref 3.5–5.1)
Sodium: 136 mmol/L (ref 135–145)

## 2021-07-06 LAB — GLUCOSE, CAPILLARY
Glucose-Capillary: 175 mg/dL — ABNORMAL HIGH (ref 70–99)
Glucose-Capillary: 172 mg/dL — ABNORMAL HIGH (ref 70–99)
Glucose-Capillary: 199 mg/dL — ABNORMAL HIGH (ref 70–99)
Glucose-Capillary: 185 mg/dL — ABNORMAL HIGH (ref 70–99)
Glucose-Capillary: 143 mg/dL — ABNORMAL HIGH (ref 70–99)
Glucose-Capillary: 140 mg/dL — ABNORMAL HIGH (ref 70–99)

## 2021-07-06 LAB — MAGNESIUM: Magnesium: 2.3 mg/dL (ref 1.7–2.4)

## 2021-07-06 MED ORDER — INSULIN ASPART 100 UNIT/ML IJ SOLN
2.0000 [IU] | Freq: Three times a day (TID) | INTRAMUSCULAR | Status: DC
  Administered 2021-07-06 – 2021-07-07 (×4): 2 [IU] via SUBCUTANEOUS

## 2021-07-06 MED ORDER — INSULIN ASPART 100 UNIT/ML IJ SOLN
0.0000 [IU] | Freq: Every day | INTRAMUSCULAR | Status: DC
  Administered 2021-07-07 – 2021-07-09 (×3): 2 [IU] via SUBCUTANEOUS

## 2021-07-06 MED ORDER — POTASSIUM CHLORIDE CRYS ER 20 MEQ PO TBCR
40.0000 meq | EXTENDED_RELEASE_TABLET | Freq: Once | ORAL | Status: AC
  Administered 2021-07-06: 40 meq via ORAL
  Filled 2021-07-06: qty 2

## 2021-07-06 MED ORDER — SERTRALINE HCL 25 MG PO TABS
25.0000 mg | ORAL_TABLET | Freq: Every day | ORAL | Status: DC
  Administered 2021-07-06 – 2021-07-10 (×5): 25 mg via ORAL
  Filled 2021-07-06 (×5): qty 1

## 2021-07-06 MED ORDER — SERTRALINE HCL 100 MG PO TABS: 100.0000 mg | ORAL_TABLET | Freq: Every day | ORAL | Status: DC

## 2021-07-06 MED ORDER — INSULIN DETEMIR 100 UNIT/ML ~~LOC~~ SOLN
12.0000 [IU] | Freq: Two times a day (BID) | SUBCUTANEOUS | Status: DC
  Administered 2021-07-06 – 2021-07-08 (×6): 12 [IU] via SUBCUTANEOUS
  Filled 2021-07-06 (×9): qty 0.12

## 2021-07-06 MED ORDER — INSULIN ASPART 100 UNIT/ML IJ SOLN
0.0000 [IU] | Freq: Three times a day (TID) | INTRAMUSCULAR | Status: DC
  Administered 2021-07-06: 2 [IU] via SUBCUTANEOUS
  Administered 2021-07-06 – 2021-07-07 (×3): 3 [IU] via SUBCUTANEOUS
  Administered 2021-07-07: 2 [IU] via SUBCUTANEOUS
  Administered 2021-07-07 – 2021-07-08 (×2): 5 [IU] via SUBCUTANEOUS
  Administered 2021-07-08: 3 [IU] via SUBCUTANEOUS
  Administered 2021-07-09 (×2): 5 [IU] via SUBCUTANEOUS
  Administered 2021-07-10: 3 [IU] via SUBCUTANEOUS

## 2021-07-06 MED ORDER — QUETIAPINE FUMARATE 25 MG PO TABS
25.0000 mg | ORAL_TABLET | Freq: Every day | ORAL | Status: DC
  Administered 2021-07-06 – 2021-07-09 (×4): 25 mg via ORAL
  Filled 2021-07-06 (×4): qty 1

## 2021-07-06 MED ORDER — COVID-19 MRNA VACC (MODERNA) 50 MCG/0.25ML IM SUSP
0.2500 mL | Freq: Once | INTRAMUSCULAR | Status: AC
  Administered 2021-07-07: 0.25 mL via INTRAMUSCULAR
  Filled 2021-07-06 (×3): qty 0.25

## 2021-07-06 MED ORDER — LACTATED RINGERS IV SOLN: INTRAVENOUS | Status: DC

## 2021-07-06 NOTE — Progress Notes (Signed)
NEUROLOGY CONSULTATION PROGRESS NOTE   Date of service: July 06, 2021 Patient Name: Brandon [name] MRN:  [id number] DOB:  05-01-1961  Brief HPI   Brandon [name] is a 60 y.o. male with PMH significant for PMHx of stroke in 2013, anxiety, PTSD, CKD III, bilateral renal cancer, depression, DM, prior hospitalization for DKA, HLD, HTN, DM, DKA and focal seizures (on Keppra), who presents acutely via EMS after sudden onset of confusion, agitation and garbled speech in the context of severe hyperglycemia and malignant hypertension.   Workup with CT head with large chronic right MCA cortical/subcortical infarct predominantly affecting the right parietal and temporal lobes.  Severely elevated glucose to greater than 700 on presentation.  Severe hypertension with SBP over 200s.  Transitioned off insulin and now on floor.   Interval Hx   Awake, alert, thankful and talkative. Denies any complaints.  Vitals   Vitals:   07/06/21 0500 07/06/21 0600 07/06/21 0704 07/06/21 0900  BP: 130/66 (!) 107/57  (!) 145/91  Pulse: 75 69  74  Resp: 17 14  15   Temp:   98.3 F (36.8 C) 99 F (37.2 C)  TempSrc:   Oral Oral  SpO2: 97% 96%  98%  Weight: 117.1 kg     Height:         Body mass index is 34.06 kg/m.  Physical Exam   General: Laying comfortably in bed; in no acute distress.  HENT: Normal oropharynx and mucosa. Normal external appearance of ears and nose.  Neck: Supple, no pain or tenderness  CV: No JVD. No peripheral edema.  Pulmonary: Symmetric Chest rise. Normal respiratory effort.  Abdomen: Soft to touch, non-tender.  Ext: No cyanosis, edema, or deformity  Skin: No rash. Normal palpation of skin.   Musculoskeletal: Normal digits and nails by inspection. No clubbing.   Neurologic Examination  Mental status/Cognition: Alert, oriented to self, place, month and year, good attention.  Speech/language: dysarthric speech(residual from prior stroke), fluent, comprehension intact, object  naming intact, repetition intact.  Cranial nerves:   CN II Pupils equal and reactive to light, Left superior temporal quadrantanopia.   CN III,IV,VI EOM intact, no gaze preference or deviation, no nystagmus    CN V normal sensation in V1, V2, and V3 segments bilaterally    CN VII no asymmetry, no nasolabial fold flattening    CN VIII normal hearing to speech    CN IX & X normal palatal elevation, no uvular deviation    CN XI 5/5 head turn and 5/5 shoulder shrug bilaterally    CN XII midline tongue protrusion    Motor:  Muscle bulk: normal, tone normal, pronator drift mild LUE drift. Mvmt Root Nerve  Muscle Right Left Comments  SA C5/6 Ax Deltoid 5 4+   EF C5/6 Mc Biceps 5 5   EE C6/7/8 Rad Triceps 5 5   WF C6/7 Med FCR     WE C7/8 PIN ECU     F Ab C8/T1 U ADM/FDI 5 4+   HF L1/2/3 Fem Illopsoas 5 4+   KE L2/3/4 Fem Quad 5 5   DF L4/5 D Peron Tib Ant 5 4+   PF S1/2 Tibial Grc/Sol 5 5    Reflexes:  Right Left Comments  Pectoralis      Biceps (C5/6) 2 2   Brachioradialis (C5/6) 2 2    Triceps (C6/7) 2 2    Patellar (L3/4) 2 2    Achilles (S1)      Hoffman  Plantar     Jaw jerk    Sensation:  Light touch Decreased in Left arm(residual from stroke)   Pin prick    Temperature    Vibration   Proprioception    Coordination/Complex Motor:  - Finger to Nose intact BL - Heel to shin intact BL - Rapid alternating movement are slowed in LUE - Gait: Deferred.  Labs   Basic Metabolic Panel:  Lab Results  Component Value Date   NA 136 07/06/2021   K 3.4 (L) 07/06/2021   CO2 23 07/06/2021   GLUCOSE 184 (H) 07/06/2021   BUN 13 07/06/2021   CREATININE 2.05 (H) 07/06/2021   CALCIUM 8.6 (L) 07/06/2021   GFRNONAA 37 (L) 07/06/2021   GFRAA 44 (L) 09/18/2020   HbA1c:  Lab Results  Component Value Date   HGBA1C 14.2 (H) 04/05/2020   LDL:  Lab Results  Component Value Date   LDLCALC 107 (H) 01/09/2020   Urine Drug Screen:     Component Value Date/Time   LABOPIA  NONE DETECTED 07/05/2021 0035   COCAINSCRNUR NONE DETECTED 07/05/2021 0035   LABBENZ POSITIVE (A) 07/05/2021 0035   AMPHETMU NONE DETECTED 07/05/2021 0035   THCU NONE DETECTED 07/05/2021 0035   LABBARB NONE DETECTED 07/05/2021 0035    Alcohol Level     Component Value Date/Time   ETH <10 07/05/2021 0419   Lab Results  Component Value Date   LEVETIRACETA 15.2 03/01/2018   No results found for: PHENYTOIN, PHENOBARB, VALPROATE, CBMZ  Imaging and Diagnostic studies   MRI Brain w/o C:(personally reviewed)  1. No evidence of acute intracranial abnormality. Specifically, no acute infarct. 2. Large remote right MCA territory infarct.  MRA:(personally reviewed)  Severe stenosis versus occlusion of a proximal right M2 MCA branch, similar to 2014 prior. Otherwise, no large vessel occlusion or hemodynamically significant proximal stenosis.  rEEG: This study is suggestive of moderate diffuse encephalopathy, nonspecific etiology. No seizures or epileptiform discharges were seen throughout the recording.   Impression   Brandon [name] is a 60 y.o. male with PMHx of stroke in 2013, anxiety, PTSD, CKD III, bilateral renal cancer, depression, DM, prior hospitalization for DKA, HLD, HTN, DM, DKA and focal seizures (on Keppra), who presents acutely via EMS after sudden onset of confusion, agitation and garbled speech in the context of severe hyperglycemia and malignant hypertension. Presentation likely explained by hyperglycemia and hypertension. Also skips 2-3 doses of his keppra per week per patient and his fiance/  He is back to his baseline. Suspect presentation was likely 2/2 HTN and HHS.  MRI Brain with remote large R MCA stroke, routine EEG with no epileptiform discharges. Notable for moderate encephalopathy.  Recommendations  - No further inpatient neurology workup recommended. - Continue home Keppra, Aspirin and Atorvastatin. - We will signoff. Please feel free to contact us  with any questions or concerns. ______________________________________________________________________   Thank you for the opportunity to take part in the care of this patient. If you have any further questions, please contact the neurology consultation attending.  Signed,  [place] Pager Number [phone]

## 2021-07-06 NOTE — Progress Notes (Signed)
Inpatient Diabetes Program Recommendations  AACE/ADA: New Consensus Statement on Inpatient Glycemic Control (2015)  Target Ranges:  Prepandial:   less than 140 mg/dL      Peak postprandial:   less than 180 mg/dL (1-2 hours)      Critically ill patients:  140 - 180 mg/dL   Lab Results  Component Value Date   GLUCAP 185 (H) 07/06/2021   HGBA1C 14.2 (H) 04/05/2020    Review of Glycemic Control Results for Brandon [name], Brandon [name] (MRN [id number]) as of 07/06/2021 16:25  Ref. Range 07/05/2021 23:21 07/06/2021 03:43 07/06/2021 07:02 07/06/2021 09:11 07/06/2021 12:16  Glucose-Capillary Latest Ref Range: 70 - 99 mg/dL 173 (H) 175 (H) 172 (H) 199 (H) 185 (H)   Diabetes history: DM 2 Outpatient Diabetes medications:  Lantus 48 units daily, Novolog 12 units tid with meals Current orders for Inpatient glycemic control:  Novolog moderate tid with meals and HS Novolog 2 units tid with meals Levemir 12 units bid Inpatient Diabetes Program Recommendations:    Spoke with patient regarding admission and potential cause of HHS.  He states that he did not take his Lantus the morning of his admission and that he ate 2 pieces of cake and a milkshake.  He states that he self-administers his insulin has had his A1C down in the past.  It looks like A1C in July of 2020 was 8.6%.  He states that he needs a new meter and has not been taking care of his DM as he should.  Discussed importance of limiting sweets, eliminating sugar from beverages, etc.   No current A1C available.  Stressed importance of f/u with PCP as well. Patient was able to verbalize goal A1C and states that he knows what to do.  Will follow. Note that insulin needs in the hospital are lower than home doses of insulin.  Consider increasing Levemir to 15 units bid. (At d/c may need to reduce basal/meal coverage doses).    Will follow.   Thanks  Adah Perl, RN, BC-ADM Inpatient Diabetes Coordinator Pager [phone] (8a-5p)

## 2021-07-06 NOTE — Progress Notes (Signed)
NAME:  Brandon [name], MRN:  [id number], DOB:  1961-10-05, LOS: 2 ADMISSION DATE:  07/04/2021, CONSULTATION DATE:  07/06/21 REFERRING MD: EDP , CHIEF COMPLAINT:  AMS  History of Present Illness:  60 y/o M with past medical history significant for IDDM, schizophrenia on Quetiapine, HTN, CVA with residual L-sided deficits, stage III CKD, seizure disorder, OSAn  who presented to the ED with SO for increasing agitation and altered mental status.  He reportedly stopped taking his medications sometime recently, his significant other states that at baseline he has frequent hallucinations and paranoid thoughts secondary to PTSD and periodically stops taking his medications and ends up hospitalized.    In the ED he was initially a code stroke, initial head CT was without acute findings and he was found to be significantly hypertensive with BP as high as 230/133, glucose >800 without anion gap and normal pH, and creatinine 2.5 (baseline 1.7-2.4).  He was given Ativan and Geodon and still had significant agitation, as well as hydralazine pushes for BP and remained hypertensive, therefore PCCM was consulted.  His partner states that he occasionally drinks alcohol, but she does not think this is daily and he has tested positive for street drugs in the past, but she has not seen any recent evidence of use.  Denies recent fevers, chills, illnesses or falls.  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  Significant  Hospital Events: Including procedures, antibiotic start and stop dates in addition to other pertinent events   7/31 Admit to PCCM, negative head CT. Started on insulin gtt.  Interim History / Subjective:  Complains of mild headache, but otherwise feeling much better.  Objective   Blood pressure (!) 107/57, pulse 69, temperature 98.3 F (36.8 C), temperature source Oral, resp. rate 14, height 6\' 1"  (1.854 m), weight 117.1 kg, SpO2 96 %.        Intake/Output Summary (Last 24 hours) at 07/06/2021 0727 Last data filed at 07/06/2021 0600 Gross per 24 hour  Intake 2512.46 ml  Output 1500 ml  Net 1012.46 ml    Filed Weights   07/05/21 0027 07/05/21 0101 07/06/21 0500  Weight: 117.5 kg 117.5 kg 117.1 kg    General:  middle aged man lying in bed in NAD HEENT: [place]/AT, eyes anicteric Neuro: much more awake and interactive, not oriented to place. Moving around independently in bed, moving all extremities spontaneously. Answering questions appropriately CV: S1S2, RRR PULM: Breathing comfortably on RA, CTAB.  No conversational dyspnea.  GI: soft, NT, ND Extremities: no cyanosis or clubbing Skin: warm, dry, no rashes.  EEG: moderate diffuse encephalopathy, no epileptiform discharges or seizures seen.  BGs 170-230s overnight.   Resolved Hospital Problem list     Assessment & Plan:    Acute metabolic encephalopathy - significantly improved Superimposed on baseline schizophrenia and PTSD, POA Suspect this is likely multi-factorial due to HHS, stopping Seroquel, along with hypertensive urgency. MRI did not show CVA or PRES. P: -stable to transfer out of ICU; family agrees -optimize BP and BG- BP lower than desired with just amlodipine so will not start HCTZ yet.  -thiamine  high dose, transition to oral at discharge per neurology -bedside swallow today, PT, OT -resume seroquel and restart low dose zoloft- pharmacy fill records look like these have not been filled recently  Hypertensive  Emergency- stopped home medications HCTZ and Norvasc for unknown amount of time P: -con't amlodipine, no HCTZ   HHS, resolved, now only mild hyperglycemia Glucose >800 without anion gap or acidemia  P: -levemir increased to 12 units BID; significantly less than his home prescribed dose while NPO -SSI PRN; can add mealtime insulin once he is eating  Hypokalemia Hypomagnesemia Hypophosphatemia -repletion -repeat BMP pending   Seizure Disorder, no seizures this admission -con't keppra  HLD -continue statin    AKI, likely prerenal; back to baseline CKD 3B -repeat BMP pending -strict I/Os -renally dose meds, avoid nephrotoxic meds   Fiancee updated at bedside. Stable to transfer to med surg today. Hopefully home in the next 1-2 days if he continues to improve.    Best Practice (right click and "Reselect all SmartList Selections" daily)   Diet/type: NPO DVT prophylaxis: systemic heparin GI prophylaxis: N/A Lines: N/A Foley:  N/A Code Status:  full code Last date of multidisciplinary goals of care discussion [Code status confirmed with SO 7/31]  Labs   CBC: Recent Labs  Lab 07/04/21 1844 07/04/21 1846 07/04/21 1954 07/04/21 2254  WBC 5.9  --   --  8.9  NEUTROABS 3.5  --   --   --   HGB 11.2* 11.6* 12.2* 12.6*  HCT 32.2* 34.0* 36.0* 36.7*  MCV 85.9  --   --  86.2  PLT 215  --   --  203     Basic Metabolic Panel: Recent Labs  Lab 07/04/21 1916 07/04/21 1954 07/04/21 2254 07/05/21 0419 07/05/21 0729 07/05/21 1234 07/05/21 2150  NA 132* 134* 136 137 138 135  --   K 3.8 3.7 3.3* 3.1* 3.6 4.2  --   CL 100  --  105 106 105 107  --   CO2 22  --  22 22 26  21*  --   GLUCOSE 773*  --  322* 213* 182* 158*  --   BUN 21*  --  18 17 17 14   --   CREATININE 2.44*  --  2.19* 1.88* 2.02* 1.79*  --   CALCIUM 8.9  --  9.2 8.9 8.9 9.0  --   MG  --   --   --  1.6*  --   --   --   PHOS  --   --   --  2.4*  --   --  4.0    GFR: Estimated Creatinine Clearance: 60.3  mL/min (A) (by C-G formula based on SCr of 1.79 mg/dL (H)).    Julian Hy, DO 07/06/21 7:57 AM [facility]

## 2021-07-07 ENCOUNTER — Inpatient Hospital Stay (HOSPITAL_COMMUNITY): Payer: Medicare Other

## 2021-07-07 DIAGNOSIS — R739 Hyperglycemia, unspecified: Secondary | ICD-10-CM | POA: Diagnosis not present

## 2021-07-07 DIAGNOSIS — N179 Acute kidney failure, unspecified: Secondary | ICD-10-CM | POA: Diagnosis not present

## 2021-07-07 DIAGNOSIS — I10 Essential (primary) hypertension: Secondary | ICD-10-CM

## 2021-07-07 DIAGNOSIS — I674 Hypertensive encephalopathy: Secondary | ICD-10-CM | POA: Diagnosis not present

## 2021-07-07 LAB — GLUCOSE, CAPILLARY
Glucose-Capillary: 157 mg/dL — ABNORMAL HIGH (ref 70–99)
Glucose-Capillary: 222 mg/dL — ABNORMAL HIGH (ref 70–99)
Glucose-Capillary: 128 mg/dL — ABNORMAL HIGH (ref 70–99)
Glucose-Capillary: 214 mg/dL — ABNORMAL HIGH (ref 70–99)

## 2021-07-07 LAB — BASIC METABOLIC PANEL
Anion gap: 8 (ref 5–15)
BUN: 16 mg/dL (ref 6–20)
CO2: 21 mmol/L — ABNORMAL LOW (ref 22–32)
Calcium: 8.2 mg/dL — ABNORMAL LOW (ref 8.9–10.3)
Chloride: 107 mmol/L (ref 98–111)
Creatinine, Ser: 2.58 mg/dL — ABNORMAL HIGH (ref 0.61–1.24)
GFR, Estimated: 28 mL/min — ABNORMAL LOW (ref >60)
Glucose, Bld: 209 mg/dL — ABNORMAL HIGH (ref 70–99)
Potassium: 4.8 mmol/L (ref 3.5–5.1)
Sodium: 136 mmol/L (ref 135–145)

## 2021-07-07 LAB — LEVETIRACETAM LEVEL: Levetiracetam Lvl: <1 ug/mL — ABNORMAL LOW (ref 10.0–40.0)

## 2021-07-07 MED ORDER — INSULIN ASPART 100 UNIT/ML IJ SOLN
4.0000 [IU] | Freq: Three times a day (TID) | INTRAMUSCULAR | Status: DC
  Administered 2021-07-07 – 2021-07-08 (×4): 4 [IU] via SUBCUTANEOUS

## 2021-07-07 NOTE — Progress Notes (Signed)
NAME:  Brandon [name], MRN:  [id number], DOB:  1961/06/19, LOS: 3 ADMISSION DATE:  07/04/2021, CONSULTATION DATE:  07/07/21 REFERRING MD: EDP , CHIEF COMPLAINT:  AMS  History of Present Illness:  60 y/o M with past medical history significant for IDDM, schizophrenia on Quetiapine, HTN, CVA with residual L-sided deficits, stage III CKD, seizure disorder, OSAn  who presented to the ED with SO for increasing agitation and altered mental status.  He reportedly stopped taking his medications sometime recently, his significant other states that at baseline he has frequent hallucinations and paranoid thoughts secondary to PTSD and periodically stops taking his medications and ends up hospitalized.    In the ED he was initially a code stroke, initial head CT was without acute findings and he was found to be significantly hypertensive with BP as high as 230/133, glucose >800 without anion gap and normal pH, and creatinine 2.5 (baseline 1.7-2.4).  He was given Ativan and Geodon and still had significant agitation, as well as hydralazine pushes for BP and remained hypertensive, therefore PCCM was consulted.  His partner states that he occasionally drinks alcohol, but she does not think this is daily and he has tested positive for street drugs in the past, but she has not seen any recent evidence of use.  Denies recent fevers, chills, illnesses or falls.  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  Significant  Hospital Events: Including procedures, antibiotic start and stop dates in addition to other pertinent events   7/31 Admit to PCCM, negative head CT. Started on insulin gtt.  Interim History / Subjective:  He complains of headache and feeling like his "veins are poking out" on his head, pointing over temples. Otherwise he is feeling well.  Objective   Blood pressure (!) 161/88, pulse 78, temperature 98.5 F (36.9 C), temperature source Oral, resp. rate 19, height 6\' 1"  (1.854 m), weight 121.7 kg, SpO2 100 %.        Intake/Output Summary (Last 24 hours) at 07/07/2021 1248 Last data filed at 07/07/2021 1224 Gross per 24 hour  Intake 450.75 ml  Output 700 ml  Net -249.25 ml    Filed Weights   07/05/21 0101 07/06/21 0500 07/07/21 09/06/21  Weight: 117.5 kg 117.1 kg 121.7 kg    General:  middle aged man sitting up in the chair in NAD watching TV HEENT: [place]/AT, eyes anicteric, edentulous. Palpable bilateral temporal arteries, but not obviously tender to palpation. Neuro: awake and alert, answering questions appropriately. Moving all extremities spontaneously and without focal deficits. CV: S1S2, RRR PULM: breathing comfortably on RA, CTAB GI: soft, NT, ND Extremities: no peripheral edema, no cyanosis Skin: warm, no rashes  BUN 16 Cr 2.58  Resolved Hospital Problem list   Hypokalemia Hypomagnesemia hypophosphatemia  Assessment & Plan:   AKI, non- oliguric -nephrology consult -ok to con't IVF for now -checking FeNa & renal 9544 -stopping amlodipine, but he already received today's dose. Had not restarted HCTZ. -renally dose meds, avoid nephrotoxic meds  Headache, temporal pain -checking ESR & CRP tomorrow morning; low suspicion for TA  Acute metabolic encephalopathy - resolved  Superimposed on baseline schizophrenia and PTSD, POA Suspect this is likely multi-factorial due to HHS, stopping Seroquel, along with hypertensive urgency. MRI did not show CVA or PRES. P: -con't PTA  seroquel and low-dose zoloft  -monitor BP, had stopped amlodipine due to AKI -thiamine high dose, transition to oral at discharge per neurology -appreciate PT, OT's input. Needs HH at discharge.  Hypertensive Emergency- stopped home medications HCTZ and Norvasc for unknown amount of time P: -stopping amlodipine; had not restarted HCTZ  HHS, resolved, now only mild hyperglycemia Glucose >800 without anion gap or acidemia  P: -con't levemir 12 units BID; significantly less than his home prescribed dose  -SSI PRN; increase novolog 4 units TIDAC  Seizure Disorder, no seizures this admission -con't keppra  HLD -continue statin     Dalene Seltzer updated via phone today. Will transfer to [facility] care as it may take several days for AKI to improve. PCCM will sign off.    Best Practice (right click and "Reselect all SmartList Selections" daily)   Diet/type: Regular consistency (see orders) DVT prophylaxis: prophylactic heparin  GI prophylaxis: N/A Lines: N/A Foley:  N/A Code Status:  full code Last date of multidisciplinary goals of care discussion [Code status confirmed with SO 7/31]  Labs   CBC: Recent Labs  Lab 07/04/21 1844 07/04/21 1846 07/04/21 1954 07/04/21 2254  WBC 5.9  --   --  8.9  NEUTROABS 3.5  --   --   --   HGB 11.2* 11.6* 12.2* 12.6*  HCT 32.2* 34.0* 36.0* 36.7*  MCV 85.9  --   --  86.2  PLT 215  --   --  203     Basic Metabolic Panel: Recent Labs  Lab 07/05/21 0419 07/05/21 0729 07/05/21 1234 07/05/21 2150 07/06/21 0713 07/07/21 1027  NA 137 138 135  --  136 136  K 3.1* 3.6 4.2  --  3.4* 4.8  CL 106 105 107  --  104 107  CO2 22 26 21*  --  23 21*  GLUCOSE 213* 182* 158*  --  184* 209*  BUN _0 --  13 16  CREATININE 1.88* 2.02* 1.79*  --  2.05* 2.58*  CALCIUM 8.9 8.9 9.0  --  8.6* 8.2*  MG 1.6*  --   --   --  2.3  --   PHOS 2.4*  --   --  4.0  --   --     GFR: Estimated Creatinine Clearance: 42.6 mL/min (A) (by C-G formula based on  SCr of 2.58 mg/dL (H)).    Julian Hy, DO 07/07/21 1:24 PM [facility]

## 2021-07-07 NOTE — Plan of Care (Signed)

## 2021-07-07 NOTE — Consult Note (Signed)
Nephrology Consult   Requesting provider: Noemi Chapel Service requesting consult: CCM Reason for consult: AKI on CKD 3b   Assessment/Recommendations: Brandon [name] is a/an 60 y.o. male with a past medical history DM2, schizophrenia, HTN, CVA, CKD, OSA who presents with altered mental status and agitation now with AKI  Nonoliguric worsening AKI on CKD 3B: Baseline creatinine hard to say because of fluctuating levels.  Probably around 2.  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  Acute rise in creatinine most likely related to fluctuating blood pressures causing some decreased renal perfusion and possible ATN.  -Urine studies and renal ultrasound today -Unclear how long it will take for his kidney function to improve -He would benefit from outside nephrology referral given his CKD and extensive proteinuria -Continue to monitor daily Cr, Dose meds for GFR -Monitor Daily I/Os, Daily weight  -Maintain MAP>65 for optimal renal perfusion.  -Avoid nephrotoxic medications including NSAIDs and Vanc/Zosyn combo -Currently no indication for HD  Hypertension: Blood pressure fluctuating widely while here.  Allow higher blood pressures around 270 systolic given his extensive hypertension on admission and goal of avoiding decreased renal perfusion.  Uncontrolled Diabetes Mellitus Type 2 with Hyperglycemia: HHS resolved.  Encouraged better compliance outpatient as his kidney disease is pretty severe and likely related to diabetes.  Agement per primary  AMS/schizophrenia: Likely psychotic episode given he was off his medications.  Agree with current management per primary team  Hyperlipidemia: Continue home statin   Recommendations conveyed to primary service.    [facility] 07/07/2021 2:56 PM   _____________________________________________________________________________________ CC: AKI on CKD  History of Present  Illness: Brandon [name] is a/an 60 y.o. male with a past medical history of DM2, schizophrenia, HTN, CVA, CKD, OSA who presents with altered mental status and agitation.  Patient initially presented on 7/31 for altered mental status.  He had stopped taking his psychiatric medications recently and had been experiencing hallucinations and paranoid delusions.  This has occurred in the past as well.  He was initially called as code stroke.  CT was negative.  Blood pressure was extremely high at 230/133 and glucose was undetectably high as well.  Creatinine was 2.5 on admission.  He was treated with medications to help with his agitation as well as IV hydralazine to help with his high blood pressure.  It is noted that his blood pressure did drop precipitously and has fluctuated throughout his hospital stay.  His agitation did improve with restarting some of his medications for his psychiatric illness.  He was treated for HHS because of his hyperglycemia.  It is notable that in the past he has had fluctuating creatinines and frequent AKI's.  Urine studies in the past have demonstrated extensive proteinuria.  Labs in the past of also demonstrated poorly controlled diabetes with the last one in our system being 14.  Patient's creatinine improved from 2.5 to 1.8 over the past few days has increased to 2.6.  The patient states that he feels okay.  Has noticed some edema.  Noticed maybe his urine output was a little bit lower.  Denies significant nausea or vomiting.  No diarrhea.  He understands that his diabetes and high blood pressure have not been well controlled.  We discussed that he does have chronic kidney disease that cannot go away.   Medications:  Current Facility-Administered Medications  Medication Dose Route Frequency Provider Last Rate Last Admin   0.9 %  sodium chloride  infusion   Intravenous PRN Noemi Chapel P, DO       acetaminophen (TYLENOL) tablet 650 mg  650 mg Oral Q6H PRN Lavena Bullion, MD   650 mg at 07/06/21 0912   atorvastatin (LIPITOR) tablet 40 mg  40 mg Oral Daily Lavena Bullion, MD   40 mg at 07/07/21 0826   chlorhexidine (PERIDEX) 0.12 % solution 15 mL  15 mL Mouth Rinse BID Lavena Bullion, MD   15 mL at 07/07/21 3154   Chlorhexidine Gluconate Cloth 2 % PADS 6 each  6 each Topical Daily Noemi Chapel P, DO   6 each at 07/07/21 0839   dextrose 50 % solution 0-50 mL  0-50 mL Intravenous PRN Wyvonnia Dusky, MD       docusate sodium (COLACE) capsule 100 mg  100 mg Oral BID PRN Lavena Bullion, MD       haloperidol lactate (HALDOL) injection 2 mg  2 mg Intravenous Q6H PRN Gleason, Otilio Carpen, PA-C       heparin injection 5,000 Units  5,000 Units Subcutaneous Q8H Gleason, Otilio Carpen, PA-C   5,000 Units at 07/07/21 1222   insulin aspart (novoLOG) injection 0-15 Units  0-15 Units Subcutaneous TID WC Julian Hy, DO   5 Units at 07/07/21 1215   insulin aspart (novoLOG) injection 0-5 Units  0-5 Units Subcutaneous QHS Noemi Chapel P, DO       insulin aspart (novoLOG) injection 4 Units  4 Units Subcutaneous TID WC Noemi Chapel P, DO       insulin detemir (LEVEMIR) injection 12 Units  12 Units Subcutaneous Q12H Julian Hy, DO   12 Units at 07/07/21 0827   labetalol (NORMODYNE) injection 10 mg  10 mg Intravenous Q2H PRN Noemi Chapel P, DO   10 mg at 07/05/21 1648   levETIRAcetam (KEPPRA) tablet 1,000 mg  1,000 mg Oral Daily Noemi Chapel P, DO   1,000 mg at 07/07/21 0827   MEDLINE mouth rinse  15 mL Mouth Rinse q12n4p Lavena Bullion, MD   15 mL at 07/07/21 1423   ondansetron (ZOFRAN) injection 4 mg  4 mg Intravenous Q8H PRN Lavena Bullion, MD   4 mg at 07/06/21 0086   oxyCODONE (Oxy IR/ROXICODONE) immediate release tablet 5 mg  5 mg Oral Q6H PRN Lavena Bullion, MD   5 mg at 07/07/21 1119   pantoprazole (PROTONIX) EC tablet 40 mg  40 mg Oral Daily Lavena Bullion, MD   40 mg at 07/07/21 0827   polyethylene glycol (MIRALAX / GLYCOLAX) packet  17 g  17 g Oral Daily PRN Lavena Bullion, MD       QUEtiapine (SEROQUEL) tablet 25 mg  25 mg Oral QHS Noemi Chapel P, DO   25 mg at 07/06/21 2343   sertraline (ZOLOFT) tablet 25 mg  25 mg Oral Daily Noemi Chapel P, DO   25 mg at 07/07/21 0827   thiamine 500mg  in normal saline (84ml) IVPB  500 mg Intravenous TID Kerney Elbe, MD 100 mL/hr at 07/07/21 0834 500 mg at 07/07/21 0834     ALLERGIES Morphine  MEDICAL HISTORY Past Medical History:  Diagnosis Date   Acute ischemic stroke [facility]) 11/2013   Allergy    Anxiety    Arthritis    Benign hypertension with CKD (chronic kidney disease) stage III ([place])    Bil Renal Ca dx'd 09/2011 & 11/2011   left and right; cryoablation bil   CVA (cerebral vascular accident) [facility])    stroke  Depression    BH Adm in [place] Depression   Diabetes mellitus    DKA prior hospitalization   Diverticulitis    s/p micorperforation Sept 2012-managed conservatively by Gen surgery   ED (erectile dysfunction)    Focal seizure ([place]) 11/2013   due to ischemic stroke   GERD (gastroesophageal reflux disease)    Hiatal hernia    Hyperlipidemia    Hypertension    Kidney tumor 09/2011   Renal cell CA   Seizures ([place])    none since 2016, taking Keppra - maw   Sleep apnea    Thyroid disease    "weak thyroid" per MD   Wears glasses      SOCIAL HISTORY Social History   Socioeconomic History   Marital status: Single    Spouse name: Not on file   Number of children: 2   Years of education: college   Highest education level: Not on file  Occupational History    Employer: UNEMPLOYED  Tobacco Use   Smoking status: Never   Smokeless tobacco: Never  Vaping Use   Vaping Use: Never used  Substance and Sexual Activity   Alcohol use: Not Currently    Comment: rarely   Drug use: No   Sexual activity: Never  Other Topics Concern   Not on file  Social History Narrative   Patient lives alone.   Education. Two years of college.   Not working.    Right handed.   Caffeine one soda daily. Drinks coffee    Social Determinants of Radio broadcast assistant Strain: Not on file  Food Insecurity: Not on file  Transportation Needs: Not on file  Physical Activity: Not on file  Stress: Not on file  Social Connections: Not on file  Intimate Partner Violence: Not on file     FAMILY HISTORY Family History  Problem Relation Age of Onset   Diabetes Father    Heart disease Father    Pneumonia Mother    Prostate cancer Other    Stomach cancer Other    Breast cancer Sister    Colon cancer Neg Hx    Esophageal cancer Neg Hx    Rectal cancer Neg Hx       Review of Systems: 12 systems reviewed Otherwise as per HPI, all other systems reviewed and negative  Physical Exam: Vitals:   07/07/21 0608 07/07/21 1338  BP: (!) 161/88 121/70  Pulse: 78 79  Resp: 19 16  Temp: 98.5 F (36.9 C) 98.7 F (37.1 C)  SpO2: 100% 99%   Total I/O In: 360 [P.O.:360] Out: 900 [Urine:900]  Intake/Output Summary (Last 24 hours) at 07/07/2021 1456 Last data filed at 07/07/2021 1300 Gross per 24 hour  Intake 690.75 ml  Output 900 ml  Net -209.25 ml   General: well-appearing, no acute distress HEENT: anicteric sclera, oropharynx clear without lesions CV: Normal rate, no obvious murmur, trace edema in the bilateral ankles Lungs: clear to auscultation bilaterally, normal work of breathing Abd: soft, non-tender, non-distended Skin: no visible lesions or rashes Psych: alert, engaged, appropriate mood and affect, no active delusions Musculoskeletal: no obvious deformities Neuro: normal speech, no gross focal deficits   Test Results Reviewed Lab Results  Component Value Date   NA 136 07/07/2021   K 4.8 07/07/2021   CL 107 07/07/2021   CO2 21 (L) 07/07/2021   BUN 16 07/07/2021   CREATININE 2.58 (H) 07/07/2021   CALCIUM 8.2 (L) 07/07/2021   ALBUMIN 3.2 (L) 07/04/2021  PHOS 4.0 07/05/2021     I have reviewed all relevant outside healthcare  records related to the patient's current hospitalization

## 2021-07-07 NOTE — Progress Notes (Signed)
Inpatient Diabetes Program Recommendations  AACE/ADA: New Consensus Statement on Inpatient Glycemic Control (2015)  Target Ranges:  Prepandial:   less than 140 mg/dL      Peak postprandial:   less than 180 mg/dL (1-2 hours)      Critically ill patients:  140 - 180 mg/dL   Lab Results  Component Value Date   GLUCAP 222 (H) 07/07/2021   HGBA1C 14.2 (H) 04/05/2020    Review of Glycemic Control Results for Brandon [name], Brandon [name] (MRN [id number]) as of 07/07/2021 11:56  Ref. Range 07/07/2021 08:02 07/07/2021 11:20  Glucose-Capillary Latest Ref Range: 70 - 99 mg/dL 157 (H) 222 (H)  Diabetes history: DM 2 Outpatient Diabetes medications:  Lantus 48 units daily, Novolog 12 units tid with meals Current orders for Inpatient glycemic control:  Novolog moderate tid with meals and HS Novolog 2 units tid with meals Levemir 12 units bid Inpatient Diabetes Program Recommendations:    Please increase Novolog meal coverage to 4 units tid with meals.   Thanks  Adah Perl, RN, BC-ADM Inpatient Diabetes Coordinator Pager [phone]  (8a-5p)

## 2021-07-07 NOTE — TOC Initial Note (Signed)
Transition of Care [facility]) - Initial/Assessment Note    Patient Details  Name: Brandon [name] MRN: [id number] Date of Birth: [birth year]  Transition [facility] Care [facility]) CM/SW Contact:    Joanne Chars, LCSW Phone Number: 07/07/2021, 1:26 PM  Clinical Narrative:   CSW met with pt regarding recommendation for [facility].  Pt agreeable to this, but does want to speak with his significant other to make sure she is OK with this, as he lives in her house.  Choice document given.  Permission given to speak with his significant other, [name].  PCP in place.  Current DME in home: walker, cane.  Pt would like a shower chair. Pt is vaccinated for covid with two boosters.                Expected Discharge Plan: [place] Barriers to Discharge: Continued Medical Work up   Patient Goals and CMS Choice Patient states their goals for this hospitalization and ongoing recovery are:: "8 out of 10" CMS Medicare.gov Compare Post Acute Care list provided to:: Patient Choice offered to / list presented to : Patient  Expected Discharge Plan and Services Expected Discharge Plan: [place] Choice: Home Health, Durable Medical Equipment                                        Prior Living Arrangements/Services   Lives with:: Significant Other Patient language and need for interpreter reviewed:: Yes Do you feel safe going back to the place where you live?: Yes      Need for Family Participation in Patient Care: Yes (Comment) Care giver support system in place?: Yes (comment) Current home services: Other (comment) (none) Criminal Activity/Legal Involvement Pertinent to Current Situation/Hospitalization: No - Comment as needed  Activities of Daily Living Home Assistive Devices/Equipment: Walker (specify type) ADL Screening (condition at time of admission) Patient's cognitive ability adequate to safely complete daily activities?: Yes Is the patient  deaf or have difficulty hearing?: No Does the patient have difficulty seeing, even when wearing glasses/contacts?: No Does the patient have difficulty concentrating, remembering, or making decisions?: Yes Patient able to express need for assistance with ADLs?: No Does the patient have difficulty dressing or bathing?: Yes Independently performs ADLs?: No Communication: Independent Dressing (OT): Needs assistance Is this a change from baseline?: Pre-admission baseline Does the patient have difficulty walking or climbing stairs?: No Weakness of Legs: None (uses walker) Weakness of Arms/Hands: None  Permission Sought/Granted Permission sought to share information with : Family Supports Permission granted to share information with : Yes, Verbal Permission Granted  Share Information with NAME: [name], significant other     Permission granted to share info w Relationship: HH     Emotional Assessment Appearance:: Appears stated age Attitude/Demeanor/Rapport: Engaged Affect (typically observed): Appropriate, Pleasant Orientation: : Oriented to Self, Oriented to Place, Oriented to  Time, Oriented to Situation Alcohol / Substance Use: Not Applicable Psych Involvement: No (comment)  Admission diagnosis:  Hypertensive encephalopathy [I67.4] Hypertensive emergency [I16.1] Patient Active Problem List   Diagnosis Date Noted   Hypertensive encephalopathy 07/04/2021   Testicular pain, right 09/18/2020   Nausea and vomiting 09/18/2020   Abdominal discomfort 09/18/2020   Hypoglycemia 09/18/2020   Polypharmacy 09/18/2020   Testicular pain, left 04/16/2020   Pelvic pain 04/16/2020   Anemia 04/16/2020   Anxiety 04/16/2020  Depressed mood 04/16/2020   Acute renal failure superimposed on stage 3a chronic kidney disease ([place]) 04/06/2020   Abnormal radiologic findings on diagnostic imaging of right testicle 04/06/2020   Type 2 diabetes mellitus with diabetic polyneuropathy, with long-term current  use of insulin ([place]) 02/09/2020   Type 2 diabetes mellitus with stage 3a chronic kidney disease, with long-term current use of insulin ([place]) 02/09/2020   Family history of premature CAD 01/27/2020   Stage 3b chronic kidney disease ([place]) 01/09/2020   Incontinence of feces 01/09/2020   Chronic pain of left knee 01/09/2020   Left leg pain 01/09/2020   Fall 01/09/2020   History of diverticulitis 10/30/2019   Noncompliance 06/10/2019   History of renal cell cancer 04/24/2018   Erectile dysfunction 04/24/2018   Edema 03/15/2018   Seizure disorder ([place]) 03/01/2018   History of stroke 11/29/2017   Obstructive sleep apnea 11/29/2017   Uncontrolled diabetes mellitus ([place]) 05/10/2017   Dyslipidemia associated with type 2 diabetes mellitus ([place]) 05/10/2017   Hypertension associated with diabetes ([place]) 05/10/2017   History of cerebrovascular accident (CVA) with residual deficit 05/10/2017   Diabetic ulcer of left foot associated with secondary diabetes mellitus ([place]) 04/21/2016   History of seizure 01/05/2016   Depression, recurrent ([place]) 09/21/2014   Benign hypertension with CKD (chronic kidney disease) stage III ([place]) 12/30/2011   Side pain 12/30/2011   PCP:  Carlena Hurl, PA-C Pharmacy:   [id number] [address] [place] [address]Phone: [phone]:: [phone]    Social Determinants of Health (SDOH) Interventions    Readmission Risk Interventions No flowsheet data found.

## 2021-07-07 NOTE — Evaluation (Signed)
Physical Therapy Evaluation Patient Details Name: Brandon [name] MRN: [id number] DOB: Nov 25, 1961 Today's Date: 07/07/2021   History of Present Illness  60 y.o. male presents acutely via EMS after sudden onset of confusion, agitation and garbled speech. Admitted as code stroke however initial head CT was without acute findings and he was found to be significantly hypertensive with BP as high as 230/133, glucose >800 without anion gap and normal pH, and creatinine 2.5 (baseline 1.7-2.4).  PMHx of stroke in 2013, anxiety, PTSD, CKD III, bilateral renal cancer, depression, DM, prior hospitalization for DKA, HLD, HTN, DM, DKA and focal seizures (on Keppra),  Clinical Impression  Pt reports living with room mate in apartment with 2 flights of steps to enter and that he is independent in ambulation uses cane sporadically and is independent in bADLs, room mate provides iADLs. Pt also reports that his room mate has asked him to move out to subsidized housing. Pt's RN reports that he lives with his fiance and that plan is to go home with her, confirmed that room mate is the fiance. Pt is limited in safe mobility by decreased cognition especially safety awareness, in presence of L sided weakness from prior CVA, as well as back and L knee pain. Pt reports 5 falls. Pt became agitated when attempting to perform strength testing and providing assist with ambulation as pt has self described falls and L knee buckling. Overall pt is min guard for safety with mobility. Pt would benefit from HHPT at discharge however will likely refuse. PT will continue to follow acutely.     Follow Up Recommendations Home health PT;Supervision for mobility/OOB    Equipment Recommendations  None recommended by PT (has necessary equipment)       Precautions / Restrictions Precautions Precautions: Fall Precaution Comments: hx of falls especially on steps Required Braces or Orthoses:  (uses R LE brace to keep from knee  buckling) Restrictions Weight Bearing Restrictions: No      Mobility  Bed Mobility               General bed mobility comments: OOB in recliner, which was unlocked when he started to stand    Transfers Overall transfer level: Needs assistance   Transfers: Sit to/from Stand Sit to Stand: Min guard         General transfer comment: min guard for safety as chair moved away from him, vc to wait and use hands on armrests, ignored all cuing  Ambulation/Gait Ambulation/Gait assistance: Min guard Gait Distance (Feet): 80 Feet Assistive device: Rolling walker (2 wheeled) Gait Pattern/deviations: Decreased weight shift to left;Step-through pattern;Trunk flexed;Decreased dorsiflexion - left;Decreased stance time - left Gait velocity: slowed and decreased with distance Gait velocity interpretation: <1.31 ft/sec, indicative of household ambulator General Gait Details: min guard for safety, pt allowed gait belt to be placed however did not tolerate therapist using even with LoB in hallway, any cues ignored  Stairs Stairs:  (refused)              Balance Overall balance assessment: Mild deficits observed, not formally tested                                           Pertinent Vitals/Pain Pain Assessment: 0-10 Pain Score: 6  Pain Location: head and L side Pain Descriptors / Indicators: Stabbing;Throbbing Pain Intervention(s): Limited activity within patient's tolerance;Monitored during session;Repositioned  Home Living Family/patient expects to be discharged to:: Private residence Living Arrangements: Non-relatives/Friends Available Help at Discharge: Friend(s);Other (Comment) Type of Home: Apartment Home Access: Stairs to enter Entrance Stairs-Rails: Right;Left Entrance Stairs-Number of Steps: 2 flights Home Layout: One level Home Equipment: Cane - single point;Walker - 2 wheels Additional Comments: room mate hardly there, and has asked him to  find his own home, RN states that he lives with his fiance, who pt describes as roommate    Prior Function Level of Independence: Independent with assistive device(s);Needs assistance   Gait / Transfers Assistance Needed: uses cane sporatically, doesn't use RW  ADL's / Homemaking Assistance Needed: independent with bathing and dressing, roommate does iADLs.        Hand Dominance   Dominant Hand: Right    Extremity/Trunk Assessment   Upper Extremity Assessment Upper Extremity Assessment: Defer to OT evaluation    Lower Extremity Assessment Lower Extremity Assessment: LLE deficits/detail;RLE deficits/detail RLE:  (became agitated when trying to assess strength and ROM) LLE:  (became agitated when trying to assess strength and ROM)       Communication   Communication: No difficulties  Cognition Arousal/Alertness: Awake/alert Behavior During Therapy: Agitated;Impulsive Overall Cognitive Status: Impaired/Different from baseline Area of Impairment: Following commands;Safety/judgement;Problem solving                       Following Commands: Follows multi-step commands inconsistently;Follows one step commands inconsistently Safety/Judgement: Decreased awareness of safety;Decreased awareness of deficits   Problem Solving: Requires verbal cues;Requires tactile cues General Comments: pt initially very pleasant, answering cognitive questions and questions about home set up, however when trying to assess strength pt became agitated reporting all of the information about his prior stroke was in his charts and it is already documented that he has L sided weakness             Assessment/Plan    PT Assessment Patient needs continued PT services  PT Problem List Decreased strength;Decreased range of motion;Decreased activity tolerance;Decreased balance;Decreased mobility;Decreased safety awareness;Pain       PT Treatment Interventions DME instruction;Gait training;Stair  training;Functional mobility training;Therapeutic activities;Therapeutic exercise;Balance training;Cognitive remediation;Patient/family education    PT Goals (Current goals can be found in the Care Plan section)  Acute Rehab PT Goals Patient Stated Goal: none stated PT Goal Formulation: With patient Time For Goal Achievement: 07/21/21 Potential to Achieve Goals: Fair    Frequency Min 3X/week    AM-PAC PT "6 Clicks" Mobility  Outcome Measure Help needed turning from your back to your side while in a flat bed without using bedrails?: None Help needed moving from lying on your back to sitting on the side of a flat bed without using bedrails?: None Help needed moving to and from a bed to a chair (including a wheelchair)?: None Help needed standing up from a chair using your arms (e.g., wheelchair or bedside chair)?: A Little Help needed to walk in hospital room?: A Little Help needed climbing 3-5 steps with a railing? : A Little 6 Click Score: 21    End of Session Equipment Utilized During Treatment: Gait belt Activity Tolerance: Patient tolerated treatment well Patient left: in chair;with call bell/phone within reach;with chair alarm set Nurse Communication: Mobility status;Other (comment) (displeasure with therapy visit) PT Visit Diagnosis: Unsteadiness on feet (R26.81);Muscle weakness (generalized) (M62.81);Difficulty in walking, not elsewhere classified (R26.2)    Time: 7026-3785 PT Time Calculation (min) (ACUTE ONLY): 22 min   Charges:   PT Evaluation $PT Eval  Moderate Complexity: 1 Mod          Brandon [name] PT, DPT Acute Rehabilitation Services Pager [phone] Office [phone]   [place] 07/07/2021, 11:32 AM

## 2021-07-07 NOTE — Care Management Important Message (Signed)
Important Message  Patient Details  Name: Brandon [name] MRN: [id number] Date of Birth: Dec 22, 1960   Medicare Important Message Given:  Yes     Hulen Mandler 07/07/2021, 2:00 PM

## 2021-07-07 NOTE — Evaluation (Signed)
Occupational Therapy Evaluation Patient Details Name: Brandon [name] MRN: [id number] DOB: June 02, 1961 Today's Date: 07/07/2021    History of Present Illness 60 y.o. male presents acutely via EMS after sudden onset of confusion, agitation and garbled speech. Admitted as code stroke however initial head CT was without acute findings and he was found to be significantly hypertensive with BP as high as 230/133, glucose >800 without anion gap and normal pH, and creatinine 2.5 (baseline 1.7-2.4).  PMHx of stroke in 2013, anxiety, PTSD, CKD III, bilateral renal cancer, depression, DM, prior hospitalization for DKA, HLD, HTN, DM, DKA and focal seizures (on Keppra),   Clinical Impression   Patient admitted for the diagnosis above.  PTA he lives with a room mate, that works out of the home and cannot provide 24 hour assist/supervision.  Patient self reports independence with ADL, assist from roommate for bills and meds, and endorses L leg pain with a history of L leg pain.  Barriers are listed below.  He is close to his baseline for in room mobility and ADL completion from a sit/stand level.  Acute OT is indicated to maximize his functional status, and post acute recommendations are listed below.      Follow Up Recommendations  Other (comment) ([place] OT can be ordered by [facility] PT if indicated.)    Equipment Recommendations  Tub/shower seat    Recommendations for Other Services       Precautions / Restrictions Precautions Precautions: Fall Restrictions Weight Bearing Restrictions: No      Mobility Bed Mobility               General bed mobility comments: in recliner Patient Response: Cooperative  Transfers Overall transfer level: Needs assistance   Transfers: Sit to/from Stand;Stand Pivot Transfers Sit to Stand: Min guard Stand pivot transfers: Min guard            Balance Overall balance assessment: Mild deficits observed, not formally tested                                          ADL either performed or assessed with clinical judgement   ADL Overall ADL's : Needs assistance/impaired     Grooming: Wash/dry hands;Wash/dry face;Supervision/safety;Standing               Lower Body Dressing: Supervision/safety;Sitting/lateral leans   Toilet Transfer: Min guard;Ambulation           Functional mobility during ADLs: Min guard General ADL Comments: patient holding onto OT's R shoulder and reaching for objects in his environment.     Vision Patient Visual Report: No change from baseline       Perception     Praxis      Pertinent Vitals/Pain Pain Assessment: Faces Faces Pain Scale: Hurts little more Pain Location: L leg Pain Descriptors / Indicators: Sore Pain Intervention(s): Monitored during session     Hand Dominance Right   Extremity/Trunk Assessment Upper Extremity Assessment Upper Extremity Assessment: Overall WFL for tasks assessed   Lower Extremity Assessment Lower Extremity Assessment: Defer to PT evaluation   Cervical / Trunk Assessment Cervical / Trunk Assessment: Kyphotic   Communication Communication Communication: No difficulties   Cognition Arousal/Alertness: Awake/alert Behavior During Therapy: WFL for tasks assessed/performed Overall Cognitive Status: No family/caregiver present to determine baseline cognitive functioning  Home Living Family/patient expects to be discharged to:: Private residence Living Arrangements: Non-relatives/Friends Available Help at Discharge: Friend(s);Other (Comment) Type of Home: Apartment Home Access: Stairs to enter Entrance Stairs-Number of Steps: 2 flights Entrance Stairs-Rails: Right;Left Home Layout: One level     Bathroom Shower/Tub: Teacher, early years/pre: Standard Bathroom Accessibility: No   Home Equipment: Cane - single point;Walker - 2 wheels   Additional  Comments: Per Chart: room mate hardly there, and has asked him to find his own home, RN states that he lives with his fiance, who pt describes as roommate      Prior Functioning/Environment Level of Independence: Independent with assistive device(s);Needs assistance  Gait / Transfers Assistance Needed: uses cane sporatically, doesn't use RW ADL's / Homemaking Assistance Needed: independent with bathing and dressing, roommate does iADLs.            OT Problem List: Pain;Decreased safety awareness      OT Treatment/Interventions: Self-care/ADL training;Therapeutic activities    OT Goals(Current goals can be found in the care plan section) Acute Rehab OT Goals Patient Stated Goal: find my own place OT Goal Formulation: With patient Time For Goal Achievement: 07/21/21 Potential to Achieve Goals: Good ADL Goals Pt Will Perform Lower Body Bathing: Independently;sit to/from stand Pt Will Perform Lower Body Dressing: Independently;sit to/from stand Pt Will Transfer to Toilet: Independently;regular height toilet;ambulating  OT Frequency: Min 2X/week   Barriers to D/C:    none noted       Co-evaluation              AM-PAC OT "6 Clicks" Daily Activity     Outcome Measure Help from another person eating meals?: None Help from another person taking care of personal grooming?: None Help from another person toileting, which includes using toliet, bedpan, or urinal?: A Little Help from another person bathing (including washing, rinsing, drying)?: A Little Help from another person to put on and taking off regular upper body clothing?: None Help from another person to put on and taking off regular lower body clothing?: A Little 6 Click Score: 21   End of Session    Activity Tolerance: Patient tolerated treatment well Patient left: in chair;with call bell/phone within reach  OT Visit Diagnosis: Unsteadiness on feet (R26.81);Pain Pain - Right/Left: Left Pain - part of body: Leg                 Time: 8264-1583 OT Time Calculation (min): 18 min Charges:  OT General Charges $OT Visit: 1 Visit OT Evaluation $OT Eval Moderate Complexity: 1 Mod  07/07/2021  Brandon [name], OTR/L  Acute Rehabilitation Services  Office:  [phone]   Metta Clines 07/07/2021, 4:57 PM

## 2021-07-08 DIAGNOSIS — N179 Acute kidney failure, unspecified: Secondary | ICD-10-CM | POA: Diagnosis not present

## 2021-07-08 LAB — BASIC METABOLIC PANEL
Anion gap: 7 (ref 5–15)
BUN: 19 mg/dL (ref 6–20)
CO2: 23 mmol/L (ref 22–32)
Calcium: 8.7 mg/dL — ABNORMAL LOW (ref 8.9–10.3)
Chloride: 107 mmol/L (ref 98–111)
Creatinine, Ser: 2.83 mg/dL — ABNORMAL HIGH (ref 0.61–1.24)
GFR, Estimated: 25 mL/min — ABNORMAL LOW (ref >60)
Glucose, Bld: 151 mg/dL — ABNORMAL HIGH (ref 70–99)
Potassium: 4.6 mmol/L (ref 3.5–5.1)
Sodium: 137 mmol/L (ref 135–145)

## 2021-07-08 LAB — GLUCOSE, CAPILLARY
Glucose-Capillary: 191 mg/dL — ABNORMAL HIGH (ref 70–99)
Glucose-Capillary: 205 mg/dL — ABNORMAL HIGH (ref 70–99)
Glucose-Capillary: 115 mg/dL — ABNORMAL HIGH (ref 70–99)
Glucose-Capillary: 218 mg/dL — ABNORMAL HIGH (ref 70–99)

## 2021-07-08 LAB — VITAMIN B1: Vitamin B1 (Thiamine): 139.1 nmol/L (ref 66.5–200.0)

## 2021-07-08 LAB — SEDIMENTATION RATE: Sed Rate: 45 mm/hr — ABNORMAL HIGH (ref 0–16)

## 2021-07-08 LAB — C-REACTIVE PROTEIN: CRP: 1 mg/dL — ABNORMAL HIGH (ref <1.0)

## 2021-07-08 NOTE — Progress Notes (Signed)
PROGRESS NOTE    Brandon [name]  CHY:[id number] DOB: 14-Nov-1961 DOA: 07/04/2021 PCP: Carlena Hurl, PA-C   Chief Complaint  Patient presents with   Altered Mental Status    Brief Narrative:  Brandon [name] is a/an 60 y.o. male with a past medical history significant for IDDM, schizophrenia, HTN, CVA WITH residual left sided deficits, , CKD stage 3 b, OSA who presents with altered mental status and agitation now with AKI. He presented with code stroke, initial CT head was negative. He was found to be hyperglycemic with cbg >800. He was started on IV insulin and admitted to PCCm service.  He was transferred to [facility] on 07/08/21.    Assessment & Plan:[name]   Active Problems:   Hypertensive encephalopathy   Acute metabolic encephalopathy Thought to be secondary to hypertensive urgency, hyperglycemia and stopping Seroquel in the setting of PTSD.  Improving. MRI of the brain is negative for CVA/PRES Continue with Seroquel and low-dose Zoloft.  Patient was given high-dose of thiamine and transition to oral on discharge as per neurology.    Acute kidney injury Probably secondary to uncontrolled diabetes and hypertension Nephrology on board and appreciate recommendations. Ultrasound does not show any hydronephrosis. Creatinine peaked to 2.8   Seizure disorder Continue with Keppra.    Hypertensive urgency Restart home medications slowly.    HHS Resolved Continue with the Levemir twice daily and sliding scale insulin. Hemoglobin A1c is pending.     Hyperlipidemia Continue with statin.   Deconditioning:  Therapy evaluation recommending HH.   DVT prophylaxis: Heparin. Code Status: full code.  Family Communication: none at bedside.  Disposition:   Status is: Inpatient  Remains inpatient appropriate because:Ongoing diagnostic testing needed not appropriate for outpatient work up  Dispo: The patient is from: Home              Anticipated d/c is to:  pending               Patient currently is not medically stable to d/c.   Difficult to place patient No       Consultants:  Nephrology.   Procedures: none.   Antimicrobials: none.    Subjective: Reports feeling good.   Objective: Vitals:   07/06/21 2206 07/07/21 0608 07/07/21 1338 07/08/21 0500  BP: (!) 146/82 (!) 161/88 121/70   Pulse: 83 78 79   Resp: 19 19 16    Temp: 98 F (36.7 C) 98.5 F (36.9 C) 98.7 F (37.1 C)   TempSrc: Oral Oral Oral   SpO2: 100% 100% 99%   Weight:  121.7 kg  120.3 kg  Height:        Intake/Output Summary (Last 24 hours) at 07/08/2021 1128 Last data filed at 07/07/2021 1300 Gross per 24 hour  Intake 240 ml  Output 500 ml  Net -260 ml   Filed Weights   07/06/21 0500 07/07/21 0608 07/08/21 0500  Weight: 117.1 kg 121.7 kg 120.3 kg    Examination:  General exam: Appears calm and comfortable  Respiratory system: Clear to auscultation. Respiratory effort normal. Cardiovascular system: S1 & S2 heard, RRR. No JVD,  No pedal edema. Gastrointestinal system: Abdomen is nondistended, soft and nontender.. Normal bowel sounds heard. Central nervous system: Alert and oriented. No focal neurological deficits. Extremities: Symmetric 5 x 5 power. Skin: No rashes seen.  Psychiatry: no agitation.     Data Reviewed: I have personally reviewed following labs and imaging studies  CBC: Recent Labs  Lab 07/04/21 1844 07/04/21  1846 07/04/21 1954 07/04/21 2254  WBC 5.9  --   --  8.9  NEUTROABS 3.5  --   --   --   HGB 11.2* 11.6* 12.2* 12.6*  HCT 32.2* 34.0* 36.0* 36.7*  MCV 85.9  --   --  86.2  PLT 215  --   --  194    Basic Metabolic Panel: Recent Labs  Lab 07/05/21 0419 07/05/21 0729 07/05/21 1234 07/05/21 2150 07/06/21 0713 07/07/21 1027 07/08/21 0339  NA 137 138 135  --  136 136 137  K 3.1* 3.6 4.2  --  3.4* 4.8 4.6  CL 106 105 107  --  104 107 107  CO2 22 26 21*  --  23 21* 23  GLUCOSE 213* 182* 158*  --  184* 209* 151*  BUN 17 17 14   --   13 16 19   CREATININE 1.88* 2.02* 1.79*  --  2.05* 2.58* 2.83*  CALCIUM 8.9 8.9 9.0  --  8.6* 8.2* 8.7*  MG 1.6*  --   --   --  2.3  --   --   PHOS 2.4*  --   --  4.0  --   --   --     GFR: Estimated Creatinine Clearance: 38.7 mL/min (A) (by C-G formula based on SCr of 2.83 mg/dL (H)).  Liver Function Tests: Recent Labs  Lab 07/04/21 1844  AST 13*  ALT 15  ALKPHOS 158*  BILITOT 1.0  PROT 6.7  ALBUMIN 3.2*    CBG: Recent Labs  Lab 07/07/21 0802 07/07/21 1120 07/07/21 1616 07/07/21 2137 07/08/21 0802  GLUCAP 157* 222* 128* 214* 191*     Recent Results (from the past 240 hour(s))  MRSA Next Gen by PCR, Nasal     Status: None   Collection Time: 07/05/21 12:35 AM   Specimen: Nasal Mucosa; Nasal Swab  Result Value Ref Range Status   MRSA by PCR Next Gen NOT DETECTED NOT DETECTED Final    Comment: (NOTE) The GeneXpert MRSA Assay (FDA approved for NASAL specimens only), is one component of a comprehensive MRSA colonization surveillance program. It is not intended to diagnose MRSA infection nor to guide or monitor treatment for MRSA infections. Test performance is not FDA approved in patients less than 69 years old. Performed at [facility], [place] [address], [place], [place] [address]   Resp Panel by RT-PCR (Flu A&B, Covid) Nasopharyngeal Swab     Status: None   Collection Time: 07/05/21  1:24 AM   Specimen: Nasopharyngeal Swab; Nasopharyngeal(NP) swabs in vial transport medium  Result Value Ref Range Status   SARS Coronavirus 2 by RT PCR NEGATIVE NEGATIVE Final    Comment: (NOTE) SARS-CoV-2 target nucleic acids are NOT DETECTED.  The SARS-CoV-2 RNA is generally detectable in upper respiratory specimens during the acute phase of infection. The lowest concentration of SARS-CoV-2 viral copies this assay can detect is 138 copies/mL. A negative result does not preclude SARS-Cov-2 infection and should not be used as the sole basis for treatment or other patient  management decisions. A negative result may occur with  improper specimen collection/handling, submission of specimen other than nasopharyngeal swab, presence of viral mutation(s) within the areas targeted by this assay, and inadequate number of viral copies(<138 copies/mL). A negative result must be combined with clinical observations, patient history, and epidemiological information. The expected result is Negative.  Fact Sheet for Patients:  EntrepreneurPulse.com.au  Fact Sheet for Healthcare Providers:  IncredibleEmployment.be  This test  is no t yet approved or cleared by the [place] and  has been authorized for detection and/or diagnosis of SARS-CoV-2 by FDA under an Emergency Use Authorization (EUA). This EUA will remain  in effect (meaning this test can be used) for the duration of the COVID-19 declaration under Section 564(b)(1) of the Act, 21 U.S.C.section 360bbb-3(b)(1), unless the authorization is terminated  or revoked sooner.       Influenza A by PCR NEGATIVE NEGATIVE Final   Influenza B by PCR NEGATIVE NEGATIVE Final    Comment: (NOTE) The Xpert Xpress SARS-CoV-2/FLU/RSV plus assay is intended as an aid in the diagnosis of influenza from Nasopharyngeal swab specimens and should not be used as a sole basis for treatment. Nasal washings and aspirates are unacceptable for Xpert Xpress SARS-CoV-2/FLU/RSV testing.  Fact Sheet for Patients: EntrepreneurPulse.com.au  Fact Sheet for Healthcare Providers: IncredibleEmployment.be  This test is not yet approved or cleared by the [place] FDA and has been authorized for detection and/or diagnosis of SARS-CoV-2 by FDA under an Emergency Use Authorization (EUA). This EUA will remain in effect (meaning this test can be used) for the duration of the COVID-19 declaration under Section 564(b)(1) of the Act, 21 U.S.C. section 360bbb-3(b)(1),  unless the authorization is terminated or revoked.  Performed at [facility], [place] [address] [place],  [address]          Radiology Studies: US RENAL  Result Date: 07/07/2021 CLINICAL DATA:  60 year old male with acute renal insufficiency. EXAM: RENAL / URINARY TRACT ULTRASOUND COMPLETE COMPARISON:  CT of the abdomen pelvis dated 04/05/2020. FINDINGS: Right Kidney: Renal measurements: 9.6 x 5.0 x 4.4 cm = volume: 110 mL. Normal echogenicity. No hydronephrosis or shadowing stone. Left Kidney: Renal measurements: 9.5 x 6.1 x 6.0 cm = volume: 181 mL. Normal echogenicity. There is a 16 x 9 x 13 mm focus of calcification which may correspond to the cryoablation or represent a nonobstructing calculus. No hydronephrosis. Bladder: Appears normal for degree of bladder distention. Other: None. IMPRESSION: Focal calcification of the inferior pole of the left kidney versus a nonobstructing stone. No hydronephrosis. Electronically Signed   By: Anner Crete M.D.   On: 07/07/2021 23:03        Scheduled Meds:  atorvastatin  40 mg Oral Daily   chlorhexidine  15 mL Mouth Rinse BID   Chlorhexidine Gluconate Cloth  6 each Topical Daily   heparin injection (subcutaneous)  5,000 Units Subcutaneous Q8H   insulin aspart  0-15 Units Subcutaneous TID WC   insulin aspart  0-5 Units Subcutaneous QHS   insulin aspart  4 Units Subcutaneous TID WC   insulin detemir  12 Units Subcutaneous Q12H   levETIRAcetam  1,000 mg Oral Daily   mouth rinse  15 mL Mouth Rinse q12n4p   pantoprazole  40 mg Oral Daily   QUEtiapine  25 mg Oral QHS   sertraline  25 mg Oral Daily   Continuous Infusions:  sodium chloride       LOS: 4 days        Hosie Poisson, MD [facility]   To contact the attending provider between 7A-7P or the covering provider during after hours 7P-7A, please log into the web site [email or web address] and access using universal [place] password for that web site. If you do  not have the password, please call the hospital operator.  07/08/2021, 11:28 AM

## 2021-07-08 NOTE — Progress Notes (Signed)
Nephrology Follow-Up Consult note   Assessment/Recommendations: Brandon [name] is a/an 60 y.o. male with a past medical history significant for DM2, schizophrenia, HTN, CVA, CKD, OSA who presents with altered mental status and agitation now with AKI   Nonoliguric worsening AKI on CKD 3B: Fluctuating creatinine with baseline likely around 2.  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  Acute rise in creatinine most likely related to fluctuating blood pressures causing some decreased renal perfusion and possible ATN.  -Follow-up repeat urinalysis and urine electrolytes once they are obtained -Ultrasound without major concern -Creatinine may be slow to improve.  Continue to monitor -He would benefit from outside nephrology referral given his CKD and extensive proteinuria -Continue to monitor daily Cr, Dose meds for GFR -Monitor Daily I/Os, Daily weight -Maintain MAP>65 for optimal renal perfusion. -Avoid nephrotoxic medications including NSAIDs and Vanc/Zosyn combo -Currently no indication for HD   Hypertension: Blood pressure fluctuating widely while here.  Allow higher blood pressures around 222 systolic given his extensive hypertension on admission and goal of avoiding decreased renal perfusion.  Holding blood pressure medications at this time   Uncontrolled Diabetes Mellitus Type 2 with Hyperglycemia: HHS resolved.  Encouraged better compliance outpatient as his kidney disease is pretty severe and likely related to diabetes.  Management per primary   AMS/schizophrenia: Likely psychotic episode given he was off his medications.  Agree with current management per primary team   Hyperlipidemia: Continue home statin     Recommendations conveyed to primary service.    [facility] 07/08/2021 9:26 AM  ___________________________________________________________  CC: AKI on CKD  Interval History/Subjective:  Patient states he feels okay today.  He is having some pain on his left side.  Denies any issues urinating but does not think he is peeing very much.  Denies significant shortness of breath or chest pain.  Renal ultrasound with focal calcification area but no other significant concerns.  Medications:  Current Facility-Administered Medications  Medication Dose Route Frequency Provider Last Rate Last Admin   0.9 %  sodium chloride infusion   Intravenous PRN Noemi Chapel P, DO       acetaminophen (TYLENOL) tablet 650 mg  650 mg Oral Q6H PRN Lavena Bullion, MD   650 mg at 07/06/21 0912   atorvastatin (LIPITOR) tablet 40 mg  40 mg Oral Daily Lavena Bullion, MD   40 mg at 07/08/21 0825   chlorhexidine (PERIDEX) 0.12 % solution 15 mL  15 mL Mouth Rinse BID Lavena Bullion, MD   15 mL at 07/07/21 2154   Chlorhexidine Gluconate Cloth 2 % PADS 6 each  6 each Topical Daily Noemi Chapel P, DO   6 each at 07/08/21 0826   dextrose 50 % solution 0-50 mL  0-50 mL Intravenous PRN Wyvonnia Dusky, MD       docusate sodium (COLACE) capsule 100 mg  100 mg Oral BID PRN Lavena Bullion, MD       haloperidol lactate (HALDOL) injection 2 mg  2 mg Intravenous Q6H PRN Gleason, Otilio Carpen, PA-C       heparin injection 5,000 Units  5,000 Units Subcutaneous Q8H Gleason, Otilio Carpen, PA-C   5,000 Units at 07/08/21 0630   insulin aspart (novoLOG) injection 0-15 Units  0-15 Units Subcutaneous TID WC Julian Hy, DO   3 Units at 07/08/21 0827   insulin aspart (novoLOG) injection 0-5 Units  0-5 Units Subcutaneous QHS Julian Hy, DO   2  Units at 07/07/21 2151   insulin aspart (novoLOG) injection 4 Units  4 Units Subcutaneous TID WC Julian Hy, DO   4 Units at 07/08/21 0827   insulin detemir (LEVEMIR) injection 12 Units  12 Units Subcutaneous Q12H Julian Hy, DO   12 Units at 07/08/21 0825   labetalol (NORMODYNE) injection 10 mg  10 mg Intravenous Q2H PRN Julian Hy, DO   10 mg at 07/05/21 1648    levETIRAcetam (KEPPRA) tablet 1,000 mg  1,000 mg Oral Daily Noemi Chapel P, DO   1,000 mg at 07/08/21 3428   MEDLINE mouth rinse  15 mL Mouth Rinse q12n4p Lavena Bullion, MD   15 mL at 07/07/21 1423   ondansetron (ZOFRAN) injection 4 mg  4 mg Intravenous Q8H PRN Lavena Bullion, MD   4 mg at 07/06/21 7681   oxyCODONE (Oxy IR/ROXICODONE) immediate release tablet 5 mg  5 mg Oral Q6H PRN Lavena Bullion, MD   5 mg at 07/07/21 1119   pantoprazole (PROTONIX) EC tablet 40 mg  40 mg Oral Daily Lavena Bullion, MD   40 mg at 07/08/21 0826   polyethylene glycol (MIRALAX / GLYCOLAX) packet 17 g  17 g Oral Daily PRN Lavena Bullion, MD       QUEtiapine (SEROQUEL) tablet 25 mg  25 mg Oral QHS Noemi Chapel P, DO   25 mg at 07/07/21 2149   sertraline (ZOLOFT) tablet 25 mg  25 mg Oral Daily Noemi Chapel P, DO   25 mg at 07/08/21 1572      Review of Systems: 10 systems reviewed and negative except per interval history/subjective  Physical Exam: Vitals:   07/07/21 0608 07/07/21 1338  BP: (!) 161/88 121/70  Pulse: 78 79  Resp: 19 16  Temp: 98.5 F (36.9 C) 98.7 F (37.1 C)  SpO2: 100% 99%   No intake/output data recorded.  Intake/Output Summary (Last 24 hours) at 07/08/2021 6203 Last data filed at 07/07/2021 1300 Gross per 24 hour  Intake 240 ml  Output 500 ml  Net -260 ml   Constitutional: well-appearing, no acute distress ENMT: ears and nose without scars or lesions, MMM CV: normal rate, trace edema in the bilateral ankles Respiratory: Bilateral chest rise, normal work of breathing Gastrointestinal: soft, non-tender, no palpable masses or hernias Skin: no visible lesions or rashes Psych: alert, judgement/insight appropriate, appropriate mood and affect   Test Results I personally reviewed new and old clinical labs and radiology tests Lab Results  Component Value Date   NA 137 07/08/2021   K 4.6 07/08/2021   CL 107 07/08/2021   CO2 23 07/08/2021   BUN 19 07/08/2021    CREATININE 2.83 (H) 07/08/2021   CALCIUM 8.7 (L) 07/08/2021   ALBUMIN 3.2 (L) 07/04/2021   PHOS 4.0 07/05/2021

## 2021-07-09 DIAGNOSIS — E1100 Type 2 diabetes mellitus with hyperosmolarity without nonketotic hyperglycemic-hyperosmolar coma (NKHHC): Secondary | ICD-10-CM | POA: Diagnosis not present

## 2021-07-09 DIAGNOSIS — Z794 Long term (current) use of insulin: Secondary | ICD-10-CM

## 2021-07-09 DIAGNOSIS — E1122 Type 2 diabetes mellitus with diabetic chronic kidney disease: Secondary | ICD-10-CM

## 2021-07-09 DIAGNOSIS — F200 Paranoid schizophrenia: Secondary | ICD-10-CM | POA: Diagnosis not present

## 2021-07-09 DIAGNOSIS — N1832 Chronic kidney disease, stage 3b: Secondary | ICD-10-CM

## 2021-07-09 DIAGNOSIS — E1165 Type 2 diabetes mellitus with hyperglycemia: Secondary | ICD-10-CM | POA: Diagnosis not present

## 2021-07-09 LAB — HEMOGLOBIN A1C
Hgb A1c MFr Bld: 9.8 % — ABNORMAL HIGH (ref 4.8–5.6)
Mean Plasma Glucose: 234.56 mg/dL

## 2021-07-09 LAB — GLUCOSE, CAPILLARY
Glucose-Capillary: 106 mg/dL — ABNORMAL HIGH (ref 70–99)
Glucose-Capillary: 239 mg/dL — ABNORMAL HIGH (ref 70–99)
Glucose-Capillary: 229 mg/dL — ABNORMAL HIGH (ref 70–99)
Glucose-Capillary: 247 mg/dL — ABNORMAL HIGH (ref 70–99)

## 2021-07-09 LAB — BASIC METABOLIC PANEL
Anion gap: 8 (ref 5–15)
BUN: 21 mg/dL — ABNORMAL HIGH (ref 6–20)
CO2: 19 mmol/L — ABNORMAL LOW (ref 22–32)
Calcium: 8.3 mg/dL — ABNORMAL LOW (ref 8.9–10.3)
Chloride: 107 mmol/L (ref 98–111)
Creatinine, Ser: 2.68 mg/dL — ABNORMAL HIGH (ref 0.61–1.24)
GFR, Estimated: 27 mL/min — ABNORMAL LOW (ref >60)
Glucose, Bld: 200 mg/dL — ABNORMAL HIGH (ref 70–99)
Potassium: 4.4 mmol/L (ref 3.5–5.1)
Sodium: 134 mmol/L — ABNORMAL LOW (ref 135–145)

## 2021-07-09 LAB — CBC WITH DIFFERENTIAL/PLATELET
Abs Immature Granulocytes: 0.01 10*3/uL (ref 0.00–0.07)
Basophils Absolute: 0 10*3/uL (ref 0.0–0.1)
Basophils Relative: 1 %
Eosinophils Absolute: 0.1 10*3/uL (ref 0.0–0.5)
Eosinophils Relative: 2 %
HCT: 32.8 % — ABNORMAL LOW (ref 39.0–52.0)
Hemoglobin: 11.2 g/dL — ABNORMAL LOW (ref 13.0–17.0)
Immature Granulocytes: 0 %
Lymphocytes Relative: 27 %
Lymphs Abs: 1 10*3/uL (ref 0.7–4.0)
MCH: 29.9 pg (ref 26.0–34.0)
MCHC: 34.1 g/dL (ref 30.0–36.0)
MCV: 87.5 fL (ref 80.0–100.0)
Monocytes Absolute: 0.4 10*3/uL (ref 0.1–1.0)
Monocytes Relative: 11 %
Neutro Abs: 2.3 10*3/uL (ref 1.7–7.7)
Neutrophils Relative %: 59 %
Platelets: 178 10*3/uL (ref 150–400)
RBC: 3.75 MIL/uL — ABNORMAL LOW (ref 4.22–5.81)
RDW: 11.6 % (ref 11.5–15.5)
WBC: 3.9 10*3/uL — ABNORMAL LOW (ref 4.0–10.5)
nRBC: 0 % (ref 0.0–0.2)

## 2021-07-09 MED ORDER — INSULIN ASPART 100 UNIT/ML IJ SOLN
6.0000 [IU] | Freq: Three times a day (TID) | INTRAMUSCULAR | Status: DC
  Administered 2021-07-09 (×3): 6 [IU] via SUBCUTANEOUS

## 2021-07-09 MED ORDER — INSULIN DETEMIR 100 UNIT/ML ~~LOC~~ SOLN
16.0000 [IU] | Freq: Two times a day (BID) | SUBCUTANEOUS | Status: DC
  Administered 2021-07-09 (×2): 16 [IU] via SUBCUTANEOUS
  Filled 2021-07-09 (×4): qty 0.16

## 2021-07-09 NOTE — Progress Notes (Signed)
Orthopedic Tech Progress Note Patient Details:  Brandon [name] 1961/02/12 [id number] Called in order to [facility]  Patient ID: Teodoro Kil, male   DOB: 1961-08-17, 60 y.o.   MRN: [id number]  Chip Boer 07/09/2021, 10:09 AM

## 2021-07-09 NOTE — Progress Notes (Signed)
PROGRESS NOTE    Brandon [name]  KWI:[id number] DOB: 30-Jun-1961 DOA: 07/04/2021 PCP: Carlena Hurl, PA-C    Brief Narrative:  Brandon [name] is a 60 year old male with past medical history significant for IDDM, schizophrenia, essential hypertension, CVA with residual left-sided deficits, CKD stage IIIb, seizure disorder, OSA who presented to [facility] ED on 7/31 with increased agitation and altered mental status.  Patient apparently stopped taking his medications recently and his significant other states that his baseline he has frequent hallucinations and paranoid thoughts due to PTSD which results in frequent hospitalizations due to him stopping his home medications.  In the ED, patient presented as a code stroke, initial head CT without acute findings.  BP 230/133 with a glucose greater than 800 without anion gap and normal pH.  Creatinine 2.5 with a baseline 1.7-2.4.  Patient was given Ativan, Geodon and still with significant agitation.  Patient was initially admitted to the [facility] service for insulin drip and concern of need of Precedex drip.  Patient was restarted on his home medications with improvement of his confusion.  Patient was transferred to [facility] on 07/08/2021.   Assessment & Plan:   Active Problems:   Hypertensive encephalopathy   Acute metabolic encephalopathy, POA: Resolved Etiology likely multifactorial in the setting of hypertensive urgency, hyperglycemia and stopping his home antipsychotic medications.  CT head without contrast on admission unrevealing.  MRI brain negative for stroke or PRES. initially admitted to the ICU service, now mental status has improved to his normal baseline after restarting his home antipsychotics. --Continue treatment as below  AKI on CKD stage IIIb Etiology likely secondary to his uncontrolled diabetes and hypertension.  Renal ultrasound with no hydronephrosis. --Nephrology following, appreciate assistance --Cr 2.25>2.83>2.68 --Avoid  nephrotoxins, renally dose all medications --Repeat BMP in the a.m. --Follow-up arranged with Dr. Royce Macadamia at [facility] on 07/28/2021 at 3 PM  Hypertensive urgency BP elevated 230/133 on admission.  Patient discontinued his home antihypertensives.  Prior home regimen included hydralazine 50 mg p.o. 3 times daily.  --Currently holding home and hypertensives due to renal dysfunction to allow perfusion of kidneys per nephrology --Labetalol 10mg  IV q2h prn SBP >180  HHS Patient presenting with a glucose greater than 800, normal anion gap with normal pH.  Patient initially was started on insulin drip and now transition to Levemir.  Hemoglobin A1c 9.8, poorly controlled.  Home regimen includes Lantus 40 units subcutaneously daily. --Diabetic educator following, appreciate assistance --Levemir 16 units BID --NovoLog 6 units TIDAC --SSI for further coverage --CBG before every meal/at bedtime  HLD: Atorvastatin 40 mg p.o. daily  Seizure disorder: Keppra 1000 mg p.o. daily  GERD: Protonix 40 mg p.o. daily  Schizophrenia PTSD --Seroquel 25 mg p.o. nightly --Zoloft 25 mg p.o. daily  BPH: Tamsulosin 0.4 mg p.o. daily  Weakness/debility/deconditioning: --PT/OT recommends home health on discharge.   DVT prophylaxis: heparin injection 5,000 Units Start: 07/05/21 1400 SCDs Start: 07/04/21 2151   Code Status: Full Code Family Communication: Updated patient's significant other who is present at bedside this morning  Disposition Plan:  Level of care: Med-Surg Status is: Inpatient  Remains inpatient appropriate because:Ongoing diagnostic testing needed not appropriate for outpatient work up, Unsafe d/c plan, IV treatments appropriate due to intensity of illness or inability to take PO, and Inpatient level of care appropriate due to severity of illness  Dispo: The patient is from: Home              Anticipated d/c is to: Home  Patient currently is not medically stable to  d/c.   Difficult to place patient No   Consultants:  PCCM - signed off 8/4 Nephrology  Procedures:  None  Antimicrobials:  None   Subjective: Patient seen examined bedside, resting comfortably.  Sitting at edge of bed.  No specific complaints.  Significant other present and updated on plan of care.  Seen by nephrology this morning, continue to monitor renal function to ensure continues to downtrend for another 24 hours and hopefully discharge home.  Denies headache, no fever/chills/night sweats, no nausea/vomiting/diarrhea, no chest pain, no palpitations, no shortness of breath, no abdominal pain, no weakness, no fatigue, no paresthesias.  No acute events overnight per nursing staff.  Objective: Vitals:   07/08/21 1400 07/08/21 2219 07/09/21 0628 07/09/21 0800  BP: (!) 147/88 (!) 172/99 132/73   Pulse: 81 88 68   Resp: 16 19 20    Temp: 98.3 F (36.8 C) 99.5 F (37.5 C) 97.9 F (36.6 C)   TempSrc: Oral Oral Oral   SpO2: 100% 98% 100% 96%  Weight:   123.6 kg   Height:       No intake or output data in the 24 hours ending 07/09/21 1255 Filed Weights   07/07/21 0608 07/08/21 0500 07/09/21 0628  Weight: 121.7 kg 120.3 kg 123.6 kg    Examination:  General exam: Appears calm and comfortable, appears older than stated age Respiratory system: Clear to auscultation. Respiratory effort normal.  On room air Cardiovascular system: S1 & S2 heard, RRR. No JVD, murmurs, rubs, gallops or clicks. No pedal edema. Gastrointestinal system: Abdomen is nondistended, soft and nontender. No organomegaly or masses felt. Normal bowel sounds heard. Central nervous system: Alert and oriented. No focal neurological deficits. Extremities: Symmetric 5 x 5 power. Skin: No rashes, lesions or ulcers Psychiatry: Judgement and insight appear poor. Mood & affect appropriate.     Data Reviewed: I have personally reviewed following labs and imaging studies  CBC: Recent Labs  Lab 07/04/21 1844  07/04/21 1846 07/04/21 1954 07/04/21 2254 07/09/21 0041  WBC 5.9  --   --  8.9 3.9*  NEUTROABS 3.5  --   --   --  2.3  HGB 11.2* 11.6* 12.2* 12.6* 11.2*  HCT 32.2* 34.0* 36.0* 36.7* 32.8*  MCV 85.9  --   --  86.2 87.5  PLT 215  --   --  203 188   Basic Metabolic Panel: Recent Labs  Lab 07/05/21 0419 07/05/21 0729 07/05/21 1234 07/05/21 2150 07/06/21 0713 07/07/21 1027 07/08/21 0339 07/09/21 0041  NA 137   < > 135  --  136 136 137 134*  K 3.1*   < > 4.2  --  3.4* 4.8 4.6 4.4  CL 106   < > 107  --  104 107 107 107  CO2 22   < > 21*  --  23 21* 23 19*  GLUCOSE 213*   < > 158*  --  184* 209* 151* 200*  BUN 17   < > 14  --  13 16 19  21*  CREATININE 1.88*   < > 1.79*  --  2.05* 2.58* 2.83* 2.68*  CALCIUM 8.9   < > 9.0  --  8.6* 8.2* 8.7* 8.3*  MG 1.6*  --   --   --  2.3  --   --   --   PHOS 2.4*  --   --  4.0  --   --   --   --    < > =  values in this interval not displayed.   GFR: Estimated Creatinine Clearance: 41.4 mL/min (A) (by C-G formula based on SCr of 2.68 mg/dL (H)). Liver Function Tests: Recent Labs  Lab 07/04/21 1844  AST 13*  ALT 15  ALKPHOS 158*  BILITOT 1.0  PROT 6.7  ALBUMIN 3.2*   No results for input(s): LIPASE, AMYLASE in the last 168 hours. Recent Labs  Lab 07/04/21 2244  AMMONIA 18   Coagulation Profile: Recent Labs  Lab 07/04/21 1844  INR 1.3*   Cardiac Enzymes: No results for input(s): CKTOTAL, CKMB, CKMBINDEX, TROPONINI in the last 168 hours. BNP (last 3 results) No results for input(s): PROBNP in the last 8760 hours. HbA1C: Recent Labs    07/09/21 0041  HGBA1C 9.8*   CBG: Recent Labs  Lab 07/08/21 1157 07/08/21 1544 07/08/21 2203 07/09/21 0737 07/09/21 1123  GLUCAP 205* 115* 218* 106* 239*   Lipid Profile: No results for input(s): CHOL, HDL, LDLCALC, TRIG, CHOLHDL, LDLDIRECT in the last 72 hours. Thyroid Function Tests: No results for input(s): TSH, T4TOTAL, FREET4, T3FREE, THYROIDAB in the last 72 hours. Anemia  Panel: No results for input(s): VITAMINB12, FOLATE, FERRITIN, TIBC, IRON, RETICCTPCT in the last 72 hours. Sepsis Labs: No results for input(s): PROCALCITON, LATICACIDVEN in the last 168 hours.  Recent Results (from the past 240 hour(s))  MRSA Next Gen by PCR, Nasal     Status: None   Collection Time: 07/05/21 12:35 AM   Specimen: Nasal Mucosa; Nasal Swab  Result Value Ref Range Status   MRSA by PCR Next Gen NOT DETECTED NOT DETECTED Final    Comment: (NOTE) The GeneXpert MRSA Assay (FDA approved for NASAL specimens only), is one component of a comprehensive MRSA colonization surveillance program. It is not intended to diagnose MRSA infection nor to guide or monitor treatment for MRSA infections. Test performance is not FDA approved in patients less than 52 years old. Performed at [facility], [place] [address], [place], [place] [address]   Resp Panel by RT-PCR (Flu A&B, Covid) Nasopharyngeal Swab     Status: None   Collection Time: 07/05/21  1:24 AM   Specimen: Nasopharyngeal Swab; Nasopharyngeal(NP) swabs in vial transport medium  Result Value Ref Range Status   SARS Coronavirus 2 by RT PCR NEGATIVE NEGATIVE Final    Comment: (NOTE) SARS-CoV-2 target nucleic acids are NOT DETECTED.  The SARS-CoV-2 RNA is generally detectable in upper respiratory specimens during the acute phase of infection. The lowest concentration of SARS-CoV-2 viral copies this assay can detect is 138 copies/mL. A negative result does not preclude SARS-Cov-2 infection and should not be used as the sole basis for treatment or other patient management decisions. A negative result may occur with  improper specimen collection/handling, submission of specimen other than nasopharyngeal swab, presence of viral mutation(s) within the areas targeted by this assay, and inadequate number of viral copies(<138 copies/mL). A negative result must be combined with clinical observations, patient history, and  epidemiological information. The expected result is Negative.  Fact Sheet for Patients:  EntrepreneurPulse.com.au  Fact Sheet for Healthcare Providers:  IncredibleEmployment.be  This test is no t yet approved or cleared by the [place] FDA and  has been authorized for detection and/or diagnosis of SARS-CoV-2 by FDA under an Emergency Use Authorization (EUA). This EUA will remain  in effect (meaning this test can be used) for the duration of the COVID-19 declaration under Section 564(b)(1) of the Act, 21 U.S.C.section 360bbb-3(b)(1), unless the authorization is terminated  or revoked sooner.       Influenza A by PCR NEGATIVE NEGATIVE Final   Influenza B by PCR NEGATIVE NEGATIVE Final    Comment: (NOTE) The Xpert Xpress SARS-CoV-2/FLU/RSV plus assay is intended as an aid in the diagnosis of influenza from Nasopharyngeal swab specimens and should not be used as a sole basis for treatment. Nasal washings and aspirates are unacceptable for Xpert Xpress SARS-CoV-2/FLU/RSV testing.  Fact Sheet for Patients: EntrepreneurPulse.com.au  Fact Sheet for Healthcare Providers: IncredibleEmployment.be  This test is not yet approved or cleared by the [place] FDA and has been authorized for detection and/or diagnosis of SARS-CoV-2 by FDA under an Emergency Use Authorization (EUA). This EUA will remain in effect (meaning this test can be used) for the duration of the COVID-19 declaration under Section 564(b)(1) of the Act, 21 U.S.C. section 360bbb-3(b)(1), unless the authorization is terminated or revoked.  Performed at [facility], [place] [address], [place], [place] [address]          Radiology Studies: US RENAL  Result Date: 07/07/2021 CLINICAL DATA:  60 year old male with acute renal insufficiency. EXAM: RENAL / URINARY TRACT ULTRASOUND COMPLETE COMPARISON:  CT of the abdomen pelvis dated  04/05/2020. FINDINGS: Right Kidney: Renal measurements: 9.6 x 5.0 x 4.4 cm = volume: 110 mL. Normal echogenicity. No hydronephrosis or shadowing stone. Left Kidney: Renal measurements: 9.5 x 6.1 x 6.0 cm = volume: 181 mL. Normal echogenicity. There is a 16 x 9 x 13 mm focus of calcification which may correspond to the cryoablation or represent a nonobstructing calculus. No hydronephrosis. Bladder: Appears normal for degree of bladder distention. Other: None. IMPRESSION: Focal calcification of the inferior pole of the left kidney versus a nonobstructing stone. No hydronephrosis. Electronically Signed   By: Anner Crete M.D.   On: 07/07/2021 23:03        Scheduled Meds:  atorvastatin  40 mg Oral Daily   chlorhexidine  15 mL Mouth Rinse BID   Chlorhexidine Gluconate Cloth  6 each Topical Daily   heparin injection (subcutaneous)  5,000 Units Subcutaneous Q8H   insulin aspart  0-15 Units Subcutaneous TID WC   insulin aspart  0-5 Units Subcutaneous QHS   insulin aspart  6 Units Subcutaneous TID WC   insulin detemir  16 Units Subcutaneous Q12H   levETIRAcetam  1,000 mg Oral Daily   mouth rinse  15 mL Mouth Rinse q12n4p   pantoprazole  40 mg Oral Daily   QUEtiapine  25 mg Oral QHS   sertraline  25 mg Oral Daily   Continuous Infusions:  sodium chloride       LOS: 5 days    Time spent: 39 minutes spent on chart review, discussion with nursing staff, consultants, updating family and interview/physical exam; more than 50% of that time was spent in counseling and/or coordination of care.    Zykiria Bruening J [place] DO [facility] Available via Epic secure chat 7am-7pm After these hours, please refer to coverage provider listed on [email or web address] 07/09/2021, 12:55 PM

## 2021-07-09 NOTE — Progress Notes (Signed)
Physical Therapy Treatment Patient Details Name: Brandon [name] MRN: [id number] DOB: [birth year] Today's Date: 07/09/2021    History of Present Illness 60 y.o. male presents acutely via EMS after sudden onset of confusion, agitation and garbled speech. Admitted as code stroke however initial head CT was without acute findings and he was found to be significantly hypertensive with BP as high as 230/133, glucose >800 without anion gap and normal pH, and creatinine 2.5 (baseline 1.7-2.4).  PMHx of stroke in 2013, anxiety, PTSD, CKD III, bilateral renal cancer, depression, DM, prior hospitalization for DKA, HLD, HTN, DM, DKA and focal seizures (on Keppra),    PT Comments    Pt admitted with above diagnosis. Pt was able to ambulate in hallway with min guard assist however with slowed gait, left LE instability with pt holding onto rails at times and furniture for incr stability. Discussed that pt should use RW intiially and progress to cane for incr stability as well as asked MD to order left LE brace for incr stability with ambulation.  Will continue to follow pt. Pt refused to practice steps.  Pt currently with functional limitations due to balance and endurance deficits. Pt will benefit from skilled PT to increase their independence and safety with mobility to allow discharge to the venue listed below.      Follow Up Recommendations  Home health PT;Supervision for mobility/OOB     Equipment Recommendations  Other (comment) (left Bledsoe brace)    Recommendations for Other Services       Precautions / Restrictions Precautions Precautions: Fall Precaution Comments: hx of falls especially on steps Required Braces or Orthoses:  (uses R LE brace to keep from knee buckling) Restrictions Weight Bearing Restrictions: No    Mobility  Bed Mobility               General bed mobility comments: sitting EOB    Transfers Overall transfer level: Needs assistance   Transfers: Sit to/from  Stand;Stand Pivot Transfers Sit to Stand: Min guard            Ambulation/Gait Ambulation/Gait assistance: Min guard Gait Distance (Feet): 150 Feet Assistive device: None Gait Pattern/deviations: Decreased weight shift to left;Step-through pattern;Trunk flexed;Decreased dorsiflexion - left;Decreased stance time - left;Antalgic Gait velocity: slowed and decreased with distance Gait velocity interpretation: <1.31 ft/sec, indicative of household ambulator General Gait Details: min guard for safety, Pt reachign for furniture, sink, rails in hallway for stability. Pt did not have a LOB but has a very tenuous gait in which he is generally unsteady due to left LE instability.  Pt telling this PT that MD had mentioned him using a brace for his left knee at some point therefore asked MD if he could order one for ambulation and he did.  Pt aware to only wear with ambulation. Also discssued with pt to use his cane and RW at all times for incr ssafety and support.   Stairs             Wheelchair Mobility    Modified Rankin (Stroke Patients Only)       Balance Overall balance assessment: Mild deficits observed, not formally tested                                          Cognition Arousal/Alertness: Awake/alert Behavior During Therapy: WFL for tasks assessed/performed Overall Cognitive Status: No family/caregiver present to determine  baseline cognitive functioning Area of Impairment: Following commands;Safety/judgement;Problem solving                       Following Commands: Follows multi-step commands inconsistently;Follows one step commands inconsistently Safety/Judgement: Decreased awareness of safety;Decreased awareness of deficits   Problem Solving: Requires verbal cues;Requires tactile cues        Exercises      General Comments        Pertinent Vitals/Pain Pain Assessment: Faces Faces Pain Scale: Hurts little more Pain Location: L  leg Pain Descriptors / Indicators: Sore Pain Intervention(s): Limited activity within patient's tolerance;Monitored during session;Repositioned    Home Living Family/patient expects to be discharged to:: Private residence Living Arrangements: Non-relatives/Friends Available Help at Discharge: Friend(s);Other (Comment) Type of Home: Apartment Home Access: Stairs to enter Entrance Stairs-Rails: Right;Left Home Layout: One level Home Equipment: Cane - single point;Walker - 2 wheels Additional Comments: Per Chart: room mate hardly there, and has asked him to find his own home, RN states that he lives with his fiance, who pt describes as roommate    Prior Function Level of Independence: Independent with assistive device(s);Needs assistance  Gait / Transfers Assistance Needed: uses cane sporatically, doesn't use RW ADL's / Homemaking Assistance Needed: independent with bathing and dressing, roommate does iADLs.     PT Goals (current goals can now be found in the care plan section) Acute Rehab PT Goals Patient Stated Goal: find my own place Progress towards PT goals: Progressing toward goals    Frequency    Min 3X/week      PT Plan Current plan remains appropriate    Co-evaluation              AM-PAC PT "6 Clicks" Mobility   Outcome Measure  Help needed turning from your back to your side while in a flat bed without using bedrails?: None Help needed moving from lying on your back to sitting on the side of a flat bed without using bedrails?: None Help needed moving to and from a bed to a chair (including a wheelchair)?: None Help needed standing up from a chair using your arms (e.g., wheelchair or bedside chair)?: A Little Help needed to walk in hospital room?: A Little Help needed climbing 3-5 steps with a railing? : A Little 6 Click Score: 21    End of Session   Activity Tolerance: Patient tolerated treatment well Patient left: with call bell/phone within reach  (sitting EOB) Nurse Communication: Mobility status PT Visit Diagnosis: Unsteadiness on feet (R26.81);Muscle weakness (generalized) (M62.81);Difficulty in walking, not elsewhere classified (R26.2)     Time: 3875-6433 PT Time Calculation (min) (ACUTE ONLY): 20 min  Charges:  $Gait Training: 8-22 mins                     Robbye Dede M,PT Acute Rehab Services [phone] (pager)    Alvira Philips 07/09/2021, 1:06 PM

## 2021-07-09 NOTE — TOC Progression Note (Signed)
Transition of Care [facility]) - Progression Note    Patient Details  Name: Brandon [name] MRN: [id number] Date of Birth: March 11, 1961  [facility] CM/SW Contact  Joanne Chars, LCSW Phone Number: 07/09/2021, 3:48 PM  Clinical Narrative:   CSW met with pt to discuss his choice of [facility] provider.  Pt reports that he talked with [name]  but she is researching the agencies and they have not made a choice yet.  He agreed that CSW will reach out to [place]  1300: LM with Barbette Or, significant other 1535: Attempted phone call Barbette Or, no answer.  1545: CSW spoke with pt again, still no progress.  [name] supposed to call him after work at 530.  CSW told him I will be gone for the day at that time.  Pt agreed to have his selection by tomorrow morning.       Expected Discharge Plan: [place] Barriers to Discharge: Continued Medical Work up  Expected Discharge Plan and Services Expected Discharge Plan: [place] Choice: Home Health, Durable Medical Equipment                                         Social Determinants of Health (SDOH) Interventions    Readmission Risk Interventions Readmission Risk Prevention Plan 07/09/2021  Transportation Screening Complete  PCP or Specialist Appt within 3-5 Days Complete  HRI or Home Care Consult Complete  Social Work Consult for [place] Planning/Counseling Complete  Palliative Care Screening Not Applicable  Some recent data might be hidden

## 2021-07-09 NOTE — Progress Notes (Signed)
Patient ID: Brandon [name], male   DOB: 11-Jan-1961, 60 y.o.   MRN: [id number] [facility] Progress Note   Assessment/ Plan:   1. Acute kidney Injury on chronic kidney disease stage IIIb: Presumed baseline creatinine is around 2.0 with underlying proteinuric diabetic kidney disease, hypertension and (son) loss of renal mass following cryoablation for RCC.  He is suspected to have had acute injury from hemodynamic fluctuations with blood pressure changes/hyperglycemia and possibly developed ATN.  No urine output charted and creatinine appears to be slightly better on labs this morning.  No acute indications for dialysis and I have set him up to follow-up with Dr. Royce Macadamia at [facility] on 07/28/2021 at 3 PM.  I would favor monitoring him for the next 24 hours and discharging him if creatinine continues to trend down. 2.  Altered mental status: Suspected to be psychotic episode associated with underlying schizophrenia from being off of his medications.  Ongoing management per primary service. 3.  Uncontrolled type 2 diabetes mellitus: Hyperosmolar state appears to have resolved and he will need close follow-up with his primary care provider for additional glycemic control/management. 4.  Hypertension: Blood pressures remain widely variable-on monotherapy with as needed labetalol.  Subjective:   Denies any acute events overnight, denies any chest pain or shortness of breath and informs me that he has previously seen Dr. Royce Macadamia at [facility] but was scared to go back for fear of needing dialysis.   Objective:   BP 132/73 (BP Location: Right Arm)   Pulse 68   Temp 97.9 F (36.6 C) (Oral)   Resp 20   Ht 6\' 1"  (1.854 m)   Wt 123.6 kg   SpO2 100%   BMI 35.95 kg/m   Intake/Output Summary (Last 24 hours) at 07/09/2021 0840 Last data filed at 07/08/2021 0845 Gross per 24 hour  Intake 120 ml  Output --  Net 120 ml   Weight change: 3.3 kg  Physical Exam: Gen: Appears  comfortable ambulating around room CVS: Pulse regular rhythm, normal rate, S1 and S2 normal Resp: Clear to auscultation bilaterally, no rales/rhonchi Abd: Soft, obese, nontender, bowel sounds normal Ext: Trace edema restricted to ankles  Imaging: US RENAL  Result Date: 07/07/2021 CLINICAL DATA:  60 year old male with acute renal insufficiency. EXAM: RENAL / URINARY TRACT ULTRASOUND COMPLETE COMPARISON:  CT of the abdomen pelvis dated 04/05/2020. FINDINGS: Right Kidney: Renal measurements: 9.6 x 5.0 x 4.4 cm = volume: 110 mL. Normal echogenicity. No hydronephrosis or shadowing stone. Left Kidney: Renal measurements: 9.5 x 6.1 x 6.0 cm = volume: 181 mL. Normal echogenicity. There is a 16 x 9 x 13 mm focus of calcification which may correspond to the cryoablation or represent a nonobstructing calculus. No hydronephrosis. Bladder: Appears normal for degree of bladder distention. Other: None. IMPRESSION: Focal calcification of the inferior pole of the left kidney versus a nonobstructing stone. No hydronephrosis. Electronically Signed   By: Anner Crete M.D.   On: 07/07/2021 23:03    Labs: BMET Recent Labs  Lab 07/05/21 0419 07/05/21 0729 07/05/21 1234 07/05/21 2150 07/06/21 0713 07/07/21 1027 07/08/21 0339 07/09/21 0041  NA 137 138 135  --  136 136 137 134*  K 3.1* 3.6 4.2  --  3.4* 4.8 4.6 4.4  CL 106 105 107  --  104 107 107 107  CO2 22 26 21*  --  23 21* 23 19*  GLUCOSE 213* 182* 158*  --  184* 209* 151* 200*  BUN 17  17 14  --  13 16 19  21*  CREATININE 1.88* 2.02* 1.79*  --  2.05* 2.58* 2.83* 2.68*  CALCIUM 8.9 8.9 9.0  --  8.6* 8.2* 8.7* 8.3*  PHOS 2.4*  --   --  4.0  --   --   --   --    CBC Recent Labs  Lab 07/04/21 1844 07/04/21 1846 07/04/21 1954 07/04/21 2254 07/09/21 0041  WBC 5.9  --   --  8.9 3.9*  NEUTROABS 3.5  --   --   --  2.3  HGB 11.2* 11.6* 12.2* 12.6* 11.2*  HCT 32.2* 34.0* 36.0* 36.7* 32.8*  MCV 85.9  --   --  86.2 87.5  PLT 215  --   --  203 178     Medications:     atorvastatin  40 mg Oral Daily   chlorhexidine  15 mL Mouth Rinse BID   Chlorhexidine Gluconate Cloth  6 each Topical Daily   heparin injection (subcutaneous)  5,000 Units Subcutaneous Q8H   insulin aspart  0-15 Units Subcutaneous TID WC   insulin aspart  0-5 Units Subcutaneous QHS   insulin aspart  6 Units Subcutaneous TID WC   insulin detemir  16 Units Subcutaneous Q12H   levETIRAcetam  1,000 mg Oral Daily   mouth rinse  15 mL Mouth Rinse q12n4p   pantoprazole  40 mg Oral Daily   QUEtiapine  25 mg Oral QHS   sertraline  25 mg Oral Daily   Elmarie Shiley, MD 07/09/2021, 8:40 AM

## 2021-07-09 NOTE — Progress Notes (Signed)
OT Cancellation Note  Patient Details Name: Brandon [name] MRN: [id number] DOB: 1961/03/26   Cancelled Treatment:    Reason Eval/Treat Not Completed: Patient declined, seen by PT earlier, and currently on the phone with roommate, OT to try back as time allows.    [name] [name] 07/09/2021, 2:25 PM 07/09/2021  Denice Paradise, OTR/L  Acute Rehabilitation Services  Office:  [phone]

## 2021-07-10 DIAGNOSIS — F4310 Post-traumatic stress disorder, unspecified: Secondary | ICD-10-CM

## 2021-07-10 DIAGNOSIS — E1100 Type 2 diabetes mellitus with hyperosmolarity without nonketotic hyperglycemic-hyperosmolar coma (NKHHC): Secondary | ICD-10-CM | POA: Diagnosis not present

## 2021-07-10 DIAGNOSIS — E1122 Type 2 diabetes mellitus with diabetic chronic kidney disease: Secondary | ICD-10-CM | POA: Diagnosis not present

## 2021-07-10 DIAGNOSIS — I10 Essential (primary) hypertension: Secondary | ICD-10-CM | POA: Diagnosis not present

## 2021-07-10 DIAGNOSIS — F200 Paranoid schizophrenia: Secondary | ICD-10-CM | POA: Diagnosis not present

## 2021-07-10 LAB — BASIC METABOLIC PANEL
Anion gap: 7 (ref 5–15)
BUN: 24 mg/dL — ABNORMAL HIGH (ref 6–20)
CO2: 23 mmol/L (ref 22–32)
Calcium: 8.2 mg/dL — ABNORMAL LOW (ref 8.9–10.3)
Chloride: 105 mmol/L (ref 98–111)
Creatinine, Ser: 2.55 mg/dL — ABNORMAL HIGH (ref 0.61–1.24)
GFR, Estimated: 28 mL/min — ABNORMAL LOW (ref >60)
Glucose, Bld: 289 mg/dL — ABNORMAL HIGH (ref 70–99)
Potassium: 4.3 mmol/L (ref 3.5–5.1)
Sodium: 135 mmol/L (ref 135–145)

## 2021-07-10 LAB — GLUCOSE, CAPILLARY: Glucose-Capillary: 153 mg/dL — ABNORMAL HIGH (ref 70–99)

## 2021-07-10 MED ORDER — ASPIRIN EC 81 MG PO TBEC: 81.0000 mg | DELAYED_RELEASE_TABLET | Freq: Every day | ORAL | 2 refills | Status: AC

## 2021-07-10 MED ORDER — ATORVASTATIN CALCIUM 40 MG PO TABS: 40.0000 mg | ORAL_TABLET | Freq: Every day | ORAL | 2 refills | Status: DC

## 2021-07-10 MED ORDER — INSULIN DETEMIR 100 UNIT/ML ~~LOC~~ SOLN
20.0000 [IU] | Freq: Two times a day (BID) | SUBCUTANEOUS | Status: DC
  Administered 2021-07-10: 20 [IU] via SUBCUTANEOUS
  Filled 2021-07-10 (×3): qty 0.2

## 2021-07-10 MED ORDER — INSULIN ASPART 100 UNIT/ML FLEXPEN: 2.0000 [IU] | PEN_INJECTOR | Freq: Three times a day (TID) | SUBCUTANEOUS | 3 refills | Status: DC

## 2021-07-10 MED ORDER — LEVETIRACETAM 500 MG PO TABS: 1000.0000 mg | ORAL_TABLET | Freq: Every day | ORAL | 2 refills | Status: DC

## 2021-07-10 MED ORDER — QUETIAPINE FUMARATE 25 MG PO TABS: 25.0000 mg | ORAL_TABLET | Freq: Every day | ORAL | 2 refills | Status: DC

## 2021-07-10 MED ORDER — NOVOFINE PLUS PEN NEEDLE 32G X 4 MM MISC: 2 refills | Status: DC

## 2021-07-10 MED ORDER — INSULIN ASPART 100 UNIT/ML IJ SOLN
10.0000 [IU] | Freq: Three times a day (TID) | INTRAMUSCULAR | Status: DC
  Administered 2021-07-10: 10 [IU] via SUBCUTANEOUS

## 2021-07-10 MED ORDER — SERTRALINE HCL 25 MG PO TABS: 25.0000 mg | ORAL_TABLET | Freq: Every day | ORAL | 2 refills | Status: DC

## 2021-07-10 MED ORDER — TAMSULOSIN HCL 0.4 MG PO CAPS: 0.4000 mg | ORAL_CAPSULE | Freq: Every day | ORAL | 2 refills | Status: AC

## 2021-07-10 MED ORDER — HYDRALAZINE HCL 50 MG PO TABS: 50.0000 mg | ORAL_TABLET | Freq: Two times a day (BID) | ORAL | 2 refills | Status: DC

## 2021-07-10 MED ORDER — EZETIMIBE 10 MG PO TABS: 10.0000 mg | ORAL_TABLET | Freq: Every day | ORAL | 2 refills | Status: DC

## 2021-07-10 MED ORDER — INSULIN GLARGINE 100 UNIT/ML SOLOSTAR PEN: 40.0000 [IU] | PEN_INJECTOR | Freq: Every day | SUBCUTANEOUS | 2 refills | Status: DC

## 2021-07-10 MED ORDER — OMEPRAZOLE 40 MG PO CPDR: 40.0000 mg | DELAYED_RELEASE_CAPSULE | Freq: Every day | ORAL | 2 refills | Status: DC

## 2021-07-10 NOTE — Progress Notes (Addendum)
Patient ID: Brandon [name], male   DOB: September 02, 1961, 60 y.o.   MRN: [id number] [facility] Progress Note   Assessment/ Plan:   1. Acute kidney Injury on chronic kidney disease stage IIIb: Presumed baseline creatinine is around 2.0 with underlying proteinuric diabetic kidney disease, hypertension and (son) loss of renal mass following cryoablation for RCC.  He appears to have suffered hemodynamically mediated ATN from blood pressure fluctuations, he currently has improving urine output and renal function.  He has scheduled follow-up with Dr. Royce Macadamia at [facility] on 07/28/2021 at 3 PM.   2.  Altered mental status: Suspected to be psychotic episode associated with underlying schizophrenia from being off of his medications.  Ongoing management per primary service. 3.  Uncontrolled type 2 diabetes mellitus: Hyperosmolar state appears to have resolved and he will need close follow-up with his primary care provider for additional glycemic control/management. 4.  Hypertension: Blood pressures remain widely variable-on monotherapy with as needed labetalol; recommend discharging him on hydralazine 50 mg twice a day (down from preadmission 3 times daily).  Do not restart HCTZ at this time.  Subjective:   Reports to be feeling well and currently denies any chest pain or shortness of breath   Objective:   BP 134/65 (BP Location: Right Arm)   Pulse (!) 57   Temp 97.7 F (36.5 C)   Resp 19   Ht 6\' 1"  (1.854 m)   Wt 123.6 kg   SpO2 100%   BMI 35.95 kg/m  No intake or output data in the 24 hours ending 07/10/21 0847  Weight change:   Physical Exam: Gen: Appears comfortable sitting in recliner eating breakfast CVS: Pulse regular rhythm, normal rate, S1 and S2 normal Resp: Clear to auscultation bilaterally, no rales/rhonchi Abd: Soft, obese, nontender, bowel sounds normal Ext: Trace edema restricted to ankles  Imaging: No results found.  Labs: BMET Recent Labs  Lab  07/05/21 0419 07/05/21 0729 07/05/21 1234 07/05/21 2150 07/06/21 0713 07/07/21 1027 07/08/21 0339 07/09/21 0041 07/10/21 0215  NA 137 138 135  --  136 136 137 134* 135  K 3.1* 3.6 4.2  --  3.4* 4.8 4.6 4.4 4.3  CL 106 105 107  --  104 107 107 107 105  CO2 22 26 21*  --  23 21* 23 19* 23  GLUCOSE 213* 182* 158*  --  184* 209* 151* 200* 289*  BUN 17 17 14   --  13 16 19  21* 24*  CREATININE 1.88* 2.02* 1.79*  --  2.05* 2.58* 2.83* 2.68* 2.55*  CALCIUM 8.9 8.9 9.0  --  8.6* 8.2* 8.7* 8.3* 8.2*  PHOS 2.4*  --   --  4.0  --   --   --   --   --    CBC Recent Labs  Lab 07/04/21 1844 07/04/21 1846 07/04/21 1954 07/04/21 2254 07/09/21 0041  WBC 5.9  --   --  8.9 3.9*  NEUTROABS 3.5  --   --   --  2.3  HGB 11.2* 11.6* 12.2* 12.6* 11.2*  HCT 32.2* 34.0* 36.0* 36.7* 32.8*  MCV 85.9  --   --  86.2 87.5  PLT 215  --   --  203 178    Medications:     atorvastatin  40 mg Oral Daily   chlorhexidine  15 mL Mouth Rinse BID   Chlorhexidine Gluconate Cloth  6 each Topical Daily   heparin injection (subcutaneous)  5,000 Units Subcutaneous Q8H   insulin aspart  0-15 Units Subcutaneous TID WC   insulin aspart  0-5 Units Subcutaneous QHS   insulin aspart  10 Units Subcutaneous TID WC   insulin detemir  20 Units Subcutaneous Q12H   levETIRAcetam  1,000 mg Oral Daily   mouth rinse  15 mL Mouth Rinse q12n4p   pantoprazole  40 mg Oral Daily   QUEtiapine  25 mg Oral QHS   sertraline  25 mg Oral Daily   Elmarie Shiley, MD 07/10/2021, 8:47 AM

## 2021-07-10 NOTE — Discharge Summary (Signed)
Physician Discharge Summary  [name] DGL:[id number] DOB: [birth year] DOA: 07/04/2021  PCP: Carlena Hurl, PA-C  Admit date: 07/04/2021 Discharge date: 07/10/2021  Admitted From: Home  Disposition: Home  Recommendations for Outpatient Follow-up:  Follow up with PCP in 1-2 weeks Follow-up with nephrology, Dr. Royce Macadamia as scheduled Decreased antihypertensives to hydralazine 50 mg p.o. twice daily Continue to encourage compliance with home medication regimen Will need continued close follow-up in terms of his diabetic management and optimization of his insulin regimen Please obtain BMP 1 week following discharge  Home Health: PT Equipment/Devices: None  Discharge Condition: Stable CODE STATUS: Full code Diet recommendation: Heart healthy/consistent carbohydrate diet  History of present illness:  Brandon [name] is a 60 year old male with past medical history significant for IDDM, schizophrenia, essential hypertension, CVA with residual left-sided deficits, CKD stage IIIb, seizure disorder, OSA who presented to [facility] ED on 7/31 with increased agitation and altered mental status.  Patient apparently stopped taking his medications recently and his significant other states that his baseline he has frequent hallucinations and paranoid thoughts due to PTSD which results in frequent hospitalizations due to him stopping his home medications.   In the ED, patient presented as a code stroke, initial head CT without acute findings.  BP 230/133 with a glucose greater than 800 without anion gap and normal pH.  Creatinine 2.5 with a baseline 1.7-2.4.  Patient was given Ativan, Geodon and still with significant agitation.  Patient was initially admitted to the [facility] service for insulin drip and concern of need of Precedex drip.  Patient was restarted on his home medications with improvement of his confusion.  Patient was transferred to [facility] on 07/08/2021.  Hospital course:  Acute metabolic  encephalopathy, POA: Resolved Etiology likely multifactorial in the setting of hypertensive urgency, hyperglycemia and stopping his home antipsychotic medications.  CT head without contrast on admission unrevealing.  MRI brain negative for stroke or PRES. initially admitted to the ICU service, now mental status has improved to his normal baseline after restarting his home antipsychotics.   AKI on CKD stage IIIb Etiology likely secondary to his uncontrolled diabetes and hypertension.  Renal ultrasound with no hydronephrosis.  Nephrology was consulted and followed during hospital course.  Patient's creatinine peaked at 2.83 and trended down to 2.55 at time of discharge.  Patient has follow-up arranged with Dr. Royce Macadamia at [facility] on 07/28/2021 at 3 PM.  Recommend repeat BMP 1 week.   Hypertensive urgency BP elevated 230/133 on admission.  Patient discontinued his home antihypertensives.  Prior home regimen included hydralazine 50 mg p.o. 3 times daily.  Patient's and hypertensives were discontinued and patient was supported with labetalol IV during hospitalization.  Per recommendations of nephrology, patient will restart hydralazine at a lower dose 50 mg p.o. twice daily and discontinued HCTZ/amlodipine.  Outpatient follow-up with PCP/nephrology.   HHS Type 2 diabetes mellitus Patient presenting with a glucose greater than 800, normal anion gap with normal pH.  Patient initially was started on insulin drip and now transitioned back to subcutaneous insulin.  Hemoglobin A1c 9.8, poorly controlled.  Restart home Lantus 40 and subcutaneously daily with NovoLog insulin sliding scale.  Will need close follow-up with PCP for further guidance and optimization of diabetic regimen.  Home regimen includes Lantus 40 units subcutaneously daily.   HLD: Atorvastatin 40 mg p.o. daily, Zetia   Seizure disorder: Keppra 1000 mg p.o. daily   GERD: Protonix 40 mg p.o. daily   Schizophrenia PTSD Seroquel 25 mg  p.o. nightly. Zoloft 25 mg p.o. daily   BPH: Tamsulosin 0.4 mg p.o. daily   Weakness/debility/deconditioning: Discharging with home health PT.  Discharge Diagnoses:  Active Problems:   * No active hospital problems. *    Discharge Instructions  Discharge Instructions     Call MD for:  difficulty breathing, headache or visual disturbances   Complete by: As directed    Call MD for:  extreme fatigue   Complete by: As directed    Call MD for:  persistant dizziness or light-headedness   Complete by: As directed    Call MD for:  persistant nausea and vomiting   Complete by: As directed    Call MD for:  severe uncontrolled pain   Complete by: As directed    Call MD for:  temperature >100.4   Complete by: As directed    Diet - low sodium heart healthy   Complete by: As directed    Increase activity slowly   Complete by: As directed       Allergies as of 07/10/2021       Reactions   Morphine Itching        Medication List     STOP taking these medications    acetaminophen 325 MG tablet Commonly known as: TYLENOL   amLODipine 5 MG tablet Commonly known as: NORVASC   hydrochlorothiazide 12.5 MG capsule Commonly known as: MICROZIDE   HYDROcodone-acetaminophen 5-325 MG tablet Commonly known as: Norco   HYDROcodone-acetaminophen 7.5-325 MG tablet Commonly known as: NORCO   insulin lispro 100 UNIT/ML KwikPen Commonly known as: HUMALOG   Ozempic (1 MG/DOSE) 2 MG/1.5ML Sopn Generic drug: Semaglutide (1 MG/DOSE)       TAKE these medications    aspirin EC 81 MG tablet Take 1 tablet (81 mg total) by mouth daily.   atorvastatin 40 MG tablet Commonly known as: LIPITOR Take 1 tablet (40 mg total) by mouth daily.   ezetimibe 10 MG tablet Commonly known as: ZETIA Take 1 tablet (10 mg total) by mouth daily.   hydrALAZINE 50 MG tablet Commonly known as: APRESOLINE Take 1 tablet (50 mg total) by mouth in the morning and at bedtime. What changed: when to take  this   insulin aspart 100 UNIT/ML FlexPen Commonly known as: NOVOLOG Inject 2-10 Units into the skin 3 (three) times daily with meals. If glucose 150-200 take 2 units, glucose 201-250 take 4 units, 251-300 take 6 units, glucose 301-350 take 8 units, glucose 351-400 take 10 units What changed:  how much to take additional instructions   insulin glargine 100 UNIT/ML Solostar Pen Commonly known as: LANTUS Inject 40 Units into the skin daily.   levETIRAcetam 500 MG tablet Commonly known as: KEPPRA Take 2 tablets (1,000 mg total) by mouth daily.   NovoFine Plus Pen Needle 32G X 4 MM Misc Generic drug: Insulin Pen Needle Use as directed with insulin pen What changed: See the new instructions.   omeprazole 40 MG capsule Commonly known as: PRILOSEC Take 1 capsule (40 mg total) by mouth daily.   ondansetron 4 MG tablet Commonly known as: Zofran Take 1 tablet (4 mg total) by mouth every 8 (eight) hours as needed for nausea or vomiting.   QUEtiapine 25 MG tablet Commonly known as: SEROQUEL Take 1 tablet (25 mg total) by mouth at bedtime. What changed: when to take this   sertraline 25 MG tablet Commonly known as: ZOLOFT Take 1 tablet (25 mg total) by mouth daily. Start taking on: July 11, 2021 What changed:  medication strength how much to take   sildenafil 50 MG tablet Commonly known as: Viagra Take 1 tablet (50 mg total) by mouth as needed for erectile dysfunction.   tamsulosin 0.4 MG Caps capsule Commonly known as: FLOMAX Take 1 capsule (0.4 mg total) by mouth daily.   UNABLE TO FIND Med Name:  Custom diabetic shoes  Dx: I09.73       ASK your doctor about these medications    ACCU-CHEK ACTIVE STRIPS test strip Generic drug: glucose blood Use as instructed   glucose blood test strip accu chek active 1-2 times daily   accu-chek soft touch lancets Use as instructed   blood glucose meter kit and supplies Dispense based on patient and insurance preference.  Use up to four times daily as directed. (FOR ICD-9 250.00, 250.01).        Follow-up Information     Claudia Desanctis, MD. Go on 07/28/2021.   Specialty: Nephrology Why: Appointment at 3 PM Contact information: [address] [place] [address] [phone]         Tysinger, Camelia Eng, PA-C. Go on 07/13/2021.   Specialty: Family Medicine Why: Please attend your hospital discharge appointment with Dr Glade Lloyd on Tuesday, 07/13/21, at 12 noon. Contact information: [place] [address] [phone]                Allergies  Allergen Reactions   Morphine Itching    Consultations: PCCM Nephrology   Procedures/Studies: MR ANGIO HEAD WO CONTRAST  Result Date: 07/05/2021 CLINICAL DATA:  Neuro deficit, acute, stroke suspected; Stroke, follow up EXAM: MRI HEAD WITHOUT CONTRAST MRA HEAD WITHOUT CONTRAST TECHNIQUE: Multiplanar, multi-echo pulse sequences of the brain and surrounding structures were acquired without intravenous contrast. Angiographic images of the Circle of Willis were acquired using MRA technique without intravenous contrast. COMPARISON:  CT head 07/04/2021. MRI October 18, 2016. MRA November 17, 2013. FINDINGS: MRI HEAD FINDINGS Brain: No acute infarction, hemorrhage, hydrocephalus, extra-axial collection or mass lesion. Large remote right MCA territory infarct with encephalomalacia, surrounding gliosis, and laminar necrosis. Ex vacuo ventricular dilation of the adjacent lateral ventricle. Additional scattered T2 hyperintensities in the white matter, likely related to chronic microvascular ischemic disease. Vascular: See below. Skull and upper cervical spine: Normal marrow signal. Sinuses/Orbits: Mild ethmoid air cell mucosal thickening. Otherwise, clear sinuses. Unremarkable orbits. Other: Trace bilateral mastoid fluid. MRA HEAD FINDINGS Anterior circulation: Severe stenosis versus occlusion of a proximal right M2 MCA branch, similar to prior. Otherwise,  bilateral intracranial ICAs, MCAs, and ACAs are patent without proximal flow limiting stenosis. No aneurysm identified. Posterior circulation: Bilateral intradural vertebral arteries, basilar artery, and posterior cerebral arteries are patent without evidence of a proximal hemodynamically significant stenosis. Bilateral posterior communicating arteries are present with hypoplastic right P1 PCA, anatomic variant. No aneurysm identified. Anatomic variants: See above. IMPRESSION: MRI: 1. No evidence of acute intracranial abnormality. Specifically, no acute infarct. 2. Large remote right MCA territory infarct. MRA: Severe stenosis versus occlusion of a proximal right M2 MCA branch, similar to 2014 prior. Otherwise, no large vessel occlusion or hemodynamically significant proximal stenosis. Electronically Signed   By: Margaretha Sheffield MD   On: 07/05/2021 11:44   MR BRAIN WO CONTRAST  Result Date: 07/05/2021 CLINICAL DATA:  Neuro deficit, acute, stroke suspected; Stroke, follow up EXAM: MRI HEAD WITHOUT CONTRAST MRA HEAD WITHOUT CONTRAST TECHNIQUE: Multiplanar, multi-echo pulse sequences of the brain and surrounding structures were acquired without intravenous contrast. Angiographic images of the  Circle of Willis were acquired using MRA technique without intravenous contrast. COMPARISON:  CT head 07/04/2021. MRI October 18, 2016. MRA November 17, 2013. FINDINGS: MRI HEAD FINDINGS Brain: No acute infarction, hemorrhage, hydrocephalus, extra-axial collection or mass lesion. Large remote right MCA territory infarct with encephalomalacia, surrounding gliosis, and laminar necrosis. Ex vacuo ventricular dilation of the adjacent lateral ventricle. Additional scattered T2 hyperintensities in the white matter, likely related to chronic microvascular ischemic disease. Vascular: See below. Skull and upper cervical spine: Normal marrow signal. Sinuses/Orbits: Mild ethmoid air cell mucosal thickening. Otherwise, clear sinuses.  Unremarkable orbits. Other: Trace bilateral mastoid fluid. MRA HEAD FINDINGS Anterior circulation: Severe stenosis versus occlusion of a proximal right M2 MCA branch, similar to prior. Otherwise, bilateral intracranial ICAs, MCAs, and ACAs are patent without proximal flow limiting stenosis. No aneurysm identified. Posterior circulation: Bilateral intradural vertebral arteries, basilar artery, and posterior cerebral arteries are patent without evidence of a proximal hemodynamically significant stenosis. Bilateral posterior communicating arteries are present with hypoplastic right P1 PCA, anatomic variant. No aneurysm identified. Anatomic variants: See above. IMPRESSION: MRI: 1. No evidence of acute intracranial abnormality. Specifically, no acute infarct. 2. Large remote right MCA territory infarct. MRA: Severe stenosis versus occlusion of a proximal right M2 MCA branch, similar to 2014 prior. Otherwise, no large vessel occlusion or hemodynamically significant proximal stenosis. Electronically Signed   By: Margaretha Sheffield MD   On: 07/05/2021 11:44   US RENAL  Result Date: 07/07/2021 CLINICAL DATA:  60 year old male with acute renal insufficiency. EXAM: RENAL / URINARY TRACT ULTRASOUND COMPLETE COMPARISON:  CT of the abdomen pelvis dated 04/05/2020. FINDINGS: Right Kidney: Renal measurements: 9.6 x 5.0 x 4.4 cm = volume: 110 mL. Normal echogenicity. No hydronephrosis or shadowing stone. Left Kidney: Renal measurements: 9.5 x 6.1 x 6.0 cm = volume: 181 mL. Normal echogenicity. There is a 16 x 9 x 13 mm focus of calcification which may correspond to the cryoablation or represent a nonobstructing calculus. No hydronephrosis. Bladder: Appears normal for degree of bladder distention. Other: None. IMPRESSION: Focal calcification of the inferior pole of the left kidney versus a nonobstructing stone. No hydronephrosis. Electronically Signed   By: Anner Crete M.D.   On: 07/07/2021 23:03   DG Chest Port 1  View  Result Date: 07/05/2021 CLINICAL DATA:  Hypertensive urgency.  Altered mental status. EXAM: PORTABLE CHEST 1 VIEW.  Patient is rotated. COMPARISON:  Chest x-ray 02/01/2018, CT chest 12/13/2014 FINDINGS: Enlarged cardiac silhouette that may be due to AP portable technique and patient rotation. Low lung volumes. Question hazy airspace opacity along the left peripheral lung that may be due to patient rotation. No pulmonary edema. No pleural effusion. No pneumothorax. No acute osseous abnormality. Degenerative changes of the shoulders. IMPRESSION: Low lung volumes with limited evaluation due to patient rotation. Recommend repeat PA and lateral view of the chest. Electronically Signed   By: Iven Finn M.D.   On: 07/05/2021 00:05   EEG adult  Result Date: 07/05/2021 Lora Havens, MD     07/05/2021 12:09 PM Patient Name: KARSIN PESTA MRN: [id number] Epilepsy Attending: Lora Havens Referring Physician/Provider: Dr Kerney Elbe Date: 07/05/2021 Duration: 23.09 mins Patient history: 60 y.o. male with PMHx of stroke in 2013, anxiety, PTSD, CKD III, bilateral renal cancer, depression, DM, prior hospitalization for DKA, HLD, HTN, DM, DKA and focal seizures (on Keppra), who presents acutely via EMS after sudden onset of confusion, agitation and garbled speech in the context of severe hyperglycemia and malignant hypertension.  EEG to evaluate for seizure Level of alertness: Awake, asleep AEDs during EEG study: LEV Technical aspects: This EEG study was done with scalp electrodes positioned according to the 10-20 International system of electrode placement. Electrical activity was acquired at a sampling rate of '500Hz'  and reviewed with a high frequency filter of '70Hz'  and a low frequency filter of '1Hz' . EEG data were recorded continuously and digitally stored. Description: No clear posterior dominant rhythm was seen. Sleep was characterized by vertex waves, sleep spindles (12 to 14 Hz), maximal frontocentral  region. EEG showed continuous generalized 3 to 6 Hz theta-delta slowing. Hyperventilation and photic stimulation were not performed.   ABNORMALITY - Continuous slow, generalized IMPRESSION: This study is suggestive of moderate diffuse encephalopathy, nonspecific etiology. No seizures or epileptiform discharges were seen throughout the recording. Lora Havens   CT HEAD CODE STROKE WO CONTRAST  Result Date: 07/04/2021 CLINICAL DATA:  Code stroke. Neuro deficit, acute, stroke suspected. Additional history provided: Isolated expressive aphasia. EXAM: CT HEAD WITHOUT CONTRAST TECHNIQUE: Contiguous axial images were obtained from the base of the skull through the vertex without intravenous contrast. COMPARISON:  Prior head CT examinations 07/15/2020 and earlier. Brain MRI 10/18/2016. FINDINGS: Brain: Redemonstrated large chronic right MCA territory cortical/subcortical infarct predominantly affecting the right parietal and temporal lobes. Within limitations of motion degradation, there is no appreciable acute intracranial hemorrhage or acute demarcated cortical infarction. No intracranial mass or extra-axial fluid collection is identified. No midline shift Vascular: No appreciable hyperdense vessel. Atherosclerotic calcifications. Skull: Normal. Negative for fracture or focal lesion. Sinuses/Orbits: Visualized orbits show no acute finding. No significant paranasal sinus disease at the imaged levels. ASPECTS [facility] Stroke Program Early CT Score) - Ganglionic level infarction (caudate, lentiform nuclei, internal capsule, insula, M1-M3 cortex): 7 - Supraganglionic infarction (M4-M6 cortex): 3 Total score (0-10 with 10 being normal): 10 (when discounting chronic infarcts). These results were communicated to Dr. Cheral Marker At 7:01 pmon 7/31/2022by text page via the [facility] messaging system. IMPRESSION: Significantly motion degraded examination, limiting evaluation. No appreciable acute intracranial abnormality.  Redemonstrated large chronic right MCA territory cortical/subcortical infarct, predominantly affecting the right parietal and temporal lobes. Electronically Signed   By: Kellie Simmering DO   On: 07/04/2021 19:01     Subjective:   Discharge Exam: Vitals:   07/09/21 2158 07/10/21 0447  BP: (!) 163/88 134/65  Pulse: 72 (!) 57  Resp: 18 19  Temp: 98.1 F (36.7 C) 97.7 F (36.5 C)  SpO2: 100% 100%   Vitals:   07/09/21 0800 07/09/21 1402 07/09/21 2158 07/10/21 0447  BP:  140/88 (!) 163/88 134/65  Pulse:  71 72 (!) 57  Resp:   18 19  Temp:  99.1 F (37.3 C) 98.1 F (36.7 C) 97.7 F (36.5 C)  TempSrc:      SpO2: 96% 98% 100% 100%  Weight:      Height:        General: Pt is alert, awake, not in acute distress Cardiovascular: RRR, S1/S2 +, no rubs, no gallops Respiratory: CTA bilaterally, no wheezing, no rhonchi, on room air Abdominal: Soft, NT, ND, bowel sounds + Extremities: no edema, no cyanosis    The results of significant diagnostics from this hospitalization (including imaging, microbiology, ancillary and laboratory) are listed below for reference.     Microbiology: Recent Results (from the past 240 hour(s))  MRSA Next Gen by PCR, Nasal     Status: None   Collection Time: 07/05/21 12:35 AM   Specimen: Nasal Mucosa; Nasal Swab  Result Value Ref Range Status   MRSA by PCR Next Gen NOT DETECTED NOT DETECTED Final    Comment: (NOTE) The GeneXpert MRSA Assay (FDA approved for NASAL specimens only), is one component of a comprehensive MRSA colonization surveillance program. It is not intended to diagnose MRSA infection nor to guide or monitor treatment for MRSA infections. Test performance is not FDA approved in patients less than 58 years old. Performed at [facility], [place] [address], [place], [place] [address]   Resp Panel by RT-PCR (Flu A&B, Covid) Nasopharyngeal Swab     Status: None   Collection Time: 07/05/21  1:24 AM   Specimen: Nasopharyngeal Swab;  Nasopharyngeal(NP) swabs in vial transport medium  Result Value Ref Range Status   SARS Coronavirus 2 by RT PCR NEGATIVE NEGATIVE Final    Comment: (NOTE) SARS-CoV-2 target nucleic acids are NOT DETECTED.  The SARS-CoV-2 RNA is generally detectable in upper respiratory specimens during the acute phase of infection. The lowest concentration of SARS-CoV-2 viral copies this assay can detect is 138 copies/mL. A negative result does not preclude SARS-Cov-2 infection and should not be used as the sole basis for treatment or other patient management decisions. A negative result may occur with  improper specimen collection/handling, submission of specimen other than nasopharyngeal swab, presence of viral mutation(s) within the areas targeted by this assay, and inadequate number of viral copies(<138 copies/mL). A negative result must be combined with clinical observations, patient history, and epidemiological information. The expected result is Negative.  Fact Sheet for Patients:  EntrepreneurPulse.com.au  Fact Sheet for Healthcare Providers:  IncredibleEmployment.be  This test is no t yet approved or cleared by the [place] FDA and  has been authorized for detection and/or diagnosis of SARS-CoV-2 by FDA under an Emergency Use Authorization (EUA). This EUA will remain  in effect (meaning this test can be used) for the duration of the COVID-19 declaration under Section 564(b)(1) of the Act, 21 U.S.C.section 360bbb-3(b)(1), unless the authorization is terminated  or revoked sooner.       Influenza A by PCR NEGATIVE NEGATIVE Final   Influenza B by PCR NEGATIVE NEGATIVE Final    Comment: (NOTE) The Xpert Xpress SARS-CoV-2/FLU/RSV plus assay is intended as an aid in the diagnosis of influenza from Nasopharyngeal swab specimens and should not be used as a sole basis for treatment. Nasal washings and aspirates are unacceptable for Xpert Xpress  SARS-CoV-2/FLU/RSV testing.  Fact Sheet for Patients: EntrepreneurPulse.com.au  Fact Sheet for Healthcare Providers: IncredibleEmployment.be  This test is not yet approved or cleared by the [place] FDA and has been authorized for detection and/or diagnosis of SARS-CoV-2 by FDA under an Emergency Use Authorization (EUA). This EUA will remain in effect (meaning this test can be used) for the duration of the COVID-19 declaration under Section 564(b)(1) of the Act, 21 U.S.C. section 360bbb-3(b)(1), unless the authorization is terminated or revoked.  Performed at [facility], [place] [address] [place], [place] [address]      Labs: BNP (last 3 results) No results for input(s): BNP in the last 8760 hours. Basic Metabolic Panel: Recent Labs  Lab 07/05/21 0419 07/05/21 0729 07/05/21 2150 07/06/21 0713 07/07/21 1027 07/08/21 0339 07/09/21 0041 07/10/21 0215  NA 137   < >  --  136 136 137 134* 135  K 3.1*   < >  --  3.4* 4.8 4.6 4.4 4.3  CL 106   < >  --  104 107 107 107 105  CO2 22   < >  --  23 21* 23 19* 23  GLUCOSE 213*   < >  --  184* 209* 151* 200* 289*  BUN 17   < >  --  '13 16 19 ' 21* 24*  CREATININE 1.88*   < >  --  2.05* 2.58* 2.83* 2.68* 2.55*  CALCIUM 8.9   < >  --  8.6* 8.2* 8.7* 8.3* 8.2*  MG 1.6*  --   --  2.3  --   --   --   --   PHOS 2.4*  --  4.0  --   --   --   --   --    < > = values in this interval not displayed.   Liver Function Tests: Recent Labs  Lab 07/04/21 1844  AST 13*  ALT 15  ALKPHOS 158*  BILITOT 1.0  PROT 6.7  ALBUMIN 3.2*   No results for input(s): LIPASE, AMYLASE in the last 168 hours. Recent Labs  Lab 07/04/21 2244  AMMONIA 18   CBC: Recent Labs  Lab 07/04/21 1844 07/04/21 1846 07/04/21 1954 07/04/21 2254 07/09/21 0041  WBC 5.9  --   --  8.9 3.9*  NEUTROABS 3.5  --   --   --  2.3  HGB 11.2* 11.6* 12.2* 12.6* 11.2*  HCT 32.2* 34.0* 36.0* 36.7* 32.8*  MCV 85.9  --   --  86.2  87.5  PLT 215  --   --  203 178   Cardiac Enzymes: No results for input(s): CKTOTAL, CKMB, CKMBINDEX, TROPONINI in the last 168 hours. BNP: Invalid input(s): POCBNP CBG: Recent Labs  Lab 07/09/21 0737 07/09/21 1123 07/09/21 1619 07/09/21 2200 07/10/21 0801  GLUCAP 106* 239* 229* 247* 153*   D-Dimer No results for input(s): DDIMER in the last 72 hours. Hgb A1c Recent Labs    07/09/21 0041  HGBA1C 9.8*   Lipid Profile No results for input(s): CHOL, HDL, LDLCALC, TRIG, CHOLHDL, LDLDIRECT in the last 72 hours. Thyroid function studies No results for input(s): TSH, T4TOTAL, T3FREE, THYROIDAB in the last 72 hours.  Invalid input(s): FREET3 Anemia work up No results for input(s): VITAMINB12, FOLATE, FERRITIN, TIBC, IRON, RETICCTPCT in the last 72 hours. Urinalysis    Component Value Date/Time   COLORURINE YELLOW 07/05/2021 0035   APPEARANCEUR CLEAR 07/05/2021 0035   LABSPEC 1.009 07/05/2021 0035   LABSPEC 1.030 05/23/2017 1538   PHURINE 9.0 (H) 07/05/2021 0035   GLUCOSEU >=500 (A) 07/05/2021 0035   HGBUR SMALL (A) 07/05/2021 0035   BILIRUBINUR NEGATIVE 07/05/2021 0035   BILIRUBINUR negative 09/18/2020 1437   BILIRUBINUR neg 11/04/2016 1619   KETONESUR NEGATIVE 07/05/2021 0035   PROTEINUR >=300 (A) 07/05/2021 0035   UROBILINOGEN 0.2 09/18/2020 1437   UROBILINOGEN 1.0 12/13/2014 1029   NITRITE NEGATIVE 07/05/2021 0035   LEUKOCYTESUR NEGATIVE 07/05/2021 0035   Sepsis Labs Invalid input(s): PROCALCITONIN,  WBC,  LACTICIDVEN Microbiology Recent Results (from the past 240 hour(s))  MRSA Next Gen by PCR, Nasal     Status: None   Collection Time: 07/05/21 12:35 AM   Specimen: Nasal Mucosa; Nasal Swab  Result Value Ref Range Status   MRSA by PCR Next Gen NOT DETECTED NOT DETECTED Final    Comment: (NOTE) The GeneXpert MRSA Assay (FDA approved for NASAL specimens only), is one component of a comprehensive MRSA colonization surveillance program. It is not intended to  diagnose MRSA infection nor to guide or monitor treatment for MRSA infections. Test performance is  not FDA approved in patients less than 15 years old. Performed at [facility], [place] [address] [place], [place] [address]   Resp Panel by RT-PCR (Flu A&B, Covid) Nasopharyngeal Swab     Status: None   Collection Time: 07/05/21  1:24 AM   Specimen: Nasopharyngeal Swab; Nasopharyngeal(NP) swabs in vial transport medium  Result Value Ref Range Status   SARS Coronavirus 2 by RT PCR NEGATIVE NEGATIVE Final    Comment: (NOTE) SARS-CoV-2 target nucleic acids are NOT DETECTED.  The SARS-CoV-2 RNA is generally detectable in upper respiratory specimens during the acute phase of infection. The lowest concentration of SARS-CoV-2 viral copies this assay can detect is 138 copies/mL. A negative result does not preclude SARS-Cov-2 infection and should not be used as the sole basis for treatment or other patient management decisions. A negative result may occur with  improper specimen collection/handling, submission of specimen other than nasopharyngeal swab, presence of viral mutation(s) within the areas targeted by this assay, and inadequate number of viral copies(<138 copies/mL). A negative result must be combined with clinical observations, patient history, and epidemiological information. The expected result is Negative.  Fact Sheet for Patients:  EntrepreneurPulse.com.au  Fact Sheet for Healthcare Providers:  IncredibleEmployment.be  This test is no t yet approved or cleared by the [place] FDA and  has been authorized for detection and/or diagnosis of SARS-CoV-2 by FDA under an Emergency Use Authorization (EUA). This EUA will remain  in effect (meaning this test can be used) for the duration of the COVID-19 declaration under Section 564(b)(1) of the Act, 21 U.S.C.section 360bbb-3(b)(1), unless the authorization is terminated  or revoked sooner.        Influenza A by PCR NEGATIVE NEGATIVE Final   Influenza B by PCR NEGATIVE NEGATIVE Final    Comment: (NOTE) The Xpert Xpress SARS-CoV-2/FLU/RSV plus assay is intended as an aid in the diagnosis of influenza from Nasopharyngeal swab specimens and should not be used as a sole basis for treatment. Nasal washings and aspirates are unacceptable for Xpert Xpress SARS-CoV-2/FLU/RSV testing.  Fact Sheet for Patients: EntrepreneurPulse.com.au  Fact Sheet for Healthcare Providers: IncredibleEmployment.be  This test is not yet approved or cleared by the [place] FDA and has been authorized for detection and/or diagnosis of SARS-CoV-2 by FDA under an Emergency Use Authorization (EUA). This EUA will remain in effect (meaning this test can be used) for the duration of the COVID-19 declaration under Section 564(b)(1) of the Act, 21 U.S.C. section 360bbb-3(b)(1), unless the authorization is terminated or revoked.  Performed at [facility], [place] [address], [place], [place] [address]      Time coordinating discharge: Over 30 minutes  SIGNED:   Falisha Osment J [place] DO  [facility] 07/10/2021, 10:50 AM

## 2021-07-10 NOTE — TOC Transition Note (Signed)
Transition of Care [facility]) - CM/SW Discharge Note   Patient Details  Name: Brandon [name] MRN: [id number] Date of Birth: Jul 14, 1961  Transition of Care [facility]) CM/SW Contact:  Carles Collet, RN Phone Number: 07/10/2021, 12:44 PM   Clinical Narrative:    Damaris Schooner w patient over the phone, he was unwilling to make choice for [facility] agency. He wanted more time to decide and to decide after discharge. Informed him HH would need set up through PCP if done after discharge. Stated to him I would visit him in his room to discuss further.  Gillermo Murdoch at bedside discussing [facility] with patient and trying to get him to decide before he leaves so it can be arranged. Patient ignored CM, did not speak directly to me, nor would he make eye contact with me.  He was getting frustrated with [name] and said to her to set it up with any company, he did not have a preference.  CM confirmed with [name] that it was ok to set up with any [facility] provider. Referral accepted by [facility].  Informed [name] in front of patient that they would be in contact in 1-2 days to set up the first home health visit.     Final next level of care: [place] Barriers to Discharge: No Barriers Identified   Patient Goals and CMS Choice Patient states their goals for this hospitalization and ongoing recovery are:: "8 out of 10" CMS Medicare.gov Compare Post Acute Care list provided to:: Patient Choice offered to / list presented to : Patient  Discharge Placement                       Discharge Plan and Services     Post Acute Care Choice: Home Health, Durable Medical Equipment                    HH Arranged: PT, OT [facility] Agency: [place] Date [facility] Agency Contacted: 07/10/21 Time [place]: 1244 Representative spoke with at [place]: [place] ([place]) Interventions     Readmission Risk Interventions Readmission Risk Prevention Plan 07/09/2021  Transportation Screening  Complete  PCP or Specialist Appt within 3-5 Days Complete  HRI or [place] Complete  Social Work Consult for [place] Planning/Counseling Complete  Palliative Care Screening Not Applicable  Some recent data might be hidden

## 2021-07-12 ENCOUNTER — Telehealth: Payer: Self-pay

## 2021-07-12 NOTE — Telephone Encounter (Signed)
Transition Care Management Follow-up Telephone Call Date of discharge and from where: [place] 07/09/21 How have you been since you were released from the hospital? no Any questions or concerns? Yes  Items Reviewed: Did the pt receive and understand the discharge instructions provided? Yes  Medications obtained and verified? Yes  Other? No  Any new allergies since your discharge? No  Dietary orders reviewed? No Do you have support at home? Yes   Home Care and Equipment/Supplies: Were home health services ordered? yes If so, what is the name of the agency? [facility]  Has the agency set up a time to come to the patient's home? yes Were any new equipment or medical supplies ordered?  No What is the name of the medical supply agency? N/a Were you able to get the supplies/equipment? no Do you have any questions related to the use of the equipment or supplies? No  Functional Questionnaire: (I = Independent and D = Dependent) ADLs: I  Bathing/Dressing- I  Meal Prep- I  Eating- I  Maintaining continence- I  Transferring/Ambulation- I  Managing Meds- I  Follow up appointments reviewed:  PCP Hospital f/u appt confirmed? Yes  Scheduled to see Dorothea Ogle on 07/13/21 @ 12:15pm. [place] [facility] f/u appt confirmed? Yes  Are transportation arrangements needed? No  If their condition worsens, is the pt aware to call PCP or go to the Emergency Dept.? Yes Was the patient provided with contact information for the PCP's office or ED? Yes Was to pt encouraged to call back with questions or concerns? Yes

## 2021-07-13 ENCOUNTER — Inpatient Hospital Stay: Payer: Medicare Other | Admitting: Medical

## 2021-07-14 ENCOUNTER — Encounter: Payer: Self-pay | Admitting: Internal Medicine

## 2021-07-28 DIAGNOSIS — E162 Hypoglycemia, unspecified: Secondary | ICD-10-CM | POA: Diagnosis not present

## 2021-07-28 DIAGNOSIS — R739 Hyperglycemia, unspecified: Secondary | ICD-10-CM | POA: Diagnosis not present

## 2021-07-28 DIAGNOSIS — R404 Transient alteration of awareness: Secondary | ICD-10-CM | POA: Diagnosis not present

## 2021-07-28 DIAGNOSIS — R4020 Unspecified coma: Secondary | ICD-10-CM | POA: Diagnosis not present

## 2021-07-28 DIAGNOSIS — E161 Other hypoglycemia: Secondary | ICD-10-CM | POA: Diagnosis not present

## 2021-09-14 ENCOUNTER — Inpatient Hospital Stay (HOSPITAL_COMMUNITY)
Admission: EM | Admit: 2021-09-14 | Discharge: 2021-09-21 | DRG: 304 | Disposition: A | Discharge status: Home / Self Care | Payer: Medicare Other | Attending: Family Medicine | Admitting: Family Medicine

## 2021-09-14 ENCOUNTER — Other Ambulatory Visit: Payer: Self-pay

## 2021-09-14 ENCOUNTER — Inpatient Hospital Stay (HOSPITAL_COMMUNITY): Payer: Medicare Other

## 2021-09-14 ENCOUNTER — Encounter (HOSPITAL_COMMUNITY): Payer: Self-pay | Admitting: Emergency Medicine

## 2021-09-14 ENCOUNTER — Emergency Department (HOSPITAL_COMMUNITY): Payer: Medicare Other

## 2021-09-14 DIAGNOSIS — T85598A Other mechanical complication of other gastrointestinal prosthetic devices, implants and grafts, initial encounter: Secondary | ICD-10-CM

## 2021-09-14 DIAGNOSIS — G9340 Encephalopathy, unspecified: Secondary | ICD-10-CM

## 2021-09-14 DIAGNOSIS — Z9114 Patient's other noncompliance with medication regimen: Secondary | ICD-10-CM

## 2021-09-14 DIAGNOSIS — R1310 Dysphagia, unspecified: Secondary | ICD-10-CM | POA: Diagnosis present

## 2021-09-14 DIAGNOSIS — R739 Hyperglycemia, unspecified: Secondary | ICD-10-CM

## 2021-09-14 DIAGNOSIS — F209 Schizophrenia, unspecified: Secondary | ICD-10-CM | POA: Diagnosis present

## 2021-09-14 DIAGNOSIS — I129 Hypertensive chronic kidney disease with stage 1 through stage 4 chronic kidney disease, or unspecified chronic kidney disease: Secondary | ICD-10-CM | POA: Diagnosis present

## 2021-09-14 DIAGNOSIS — I69354 Hemiplegia and hemiparesis following cerebral infarction affecting left non-dominant side: Secondary | ICD-10-CM | POA: Diagnosis not present

## 2021-09-14 DIAGNOSIS — E1165 Type 2 diabetes mellitus with hyperglycemia: Secondary | ICD-10-CM | POA: Diagnosis not present

## 2021-09-14 DIAGNOSIS — G9341 Metabolic encephalopathy: Secondary | ICD-10-CM | POA: Diagnosis present

## 2021-09-14 DIAGNOSIS — E11649 Type 2 diabetes mellitus with hypoglycemia without coma: Secondary | ICD-10-CM | POA: Diagnosis present

## 2021-09-14 DIAGNOSIS — F840 Autistic disorder: Secondary | ICD-10-CM | POA: Diagnosis present

## 2021-09-14 DIAGNOSIS — Z8249 Family history of ischemic heart disease and other diseases of the circulatory system: Secondary | ICD-10-CM

## 2021-09-14 DIAGNOSIS — G40909 Epilepsy, unspecified, not intractable, without status epilepticus: Secondary | ICD-10-CM | POA: Diagnosis present

## 2021-09-14 DIAGNOSIS — R252 Cramp and spasm: Secondary | ICD-10-CM | POA: Diagnosis not present

## 2021-09-14 DIAGNOSIS — M1990 Unspecified osteoarthritis, unspecified site: Secondary | ICD-10-CM | POA: Diagnosis present

## 2021-09-14 DIAGNOSIS — K219 Gastro-esophageal reflux disease without esophagitis: Secondary | ICD-10-CM | POA: Diagnosis present

## 2021-09-14 DIAGNOSIS — Z20822 Contact with and (suspected) exposure to covid-19: Secondary | ICD-10-CM | POA: Diagnosis present

## 2021-09-14 DIAGNOSIS — R0689 Other abnormalities of breathing: Secondary | ICD-10-CM | POA: Diagnosis not present

## 2021-09-14 DIAGNOSIS — E785 Hyperlipidemia, unspecified: Secondary | ICD-10-CM | POA: Diagnosis present

## 2021-09-14 DIAGNOSIS — I161 Hypertensive emergency: Principal | ICD-10-CM

## 2021-09-14 DIAGNOSIS — Z4682 Encounter for fitting and adjustment of non-vascular catheter: Secondary | ICD-10-CM | POA: Diagnosis not present

## 2021-09-14 DIAGNOSIS — Z803 Family history of malignant neoplasm of breast: Secondary | ICD-10-CM

## 2021-09-14 DIAGNOSIS — Z85528 Personal history of other malignant neoplasm of kidney: Secondary | ICD-10-CM

## 2021-09-14 DIAGNOSIS — E1122 Type 2 diabetes mellitus with diabetic chronic kidney disease: Secondary | ICD-10-CM | POA: Diagnosis present

## 2021-09-14 DIAGNOSIS — Z885 Allergy status to narcotic agent status: Secondary | ICD-10-CM

## 2021-09-14 DIAGNOSIS — F4310 Post-traumatic stress disorder, unspecified: Secondary | ICD-10-CM | POA: Diagnosis present

## 2021-09-14 DIAGNOSIS — R4182 Altered mental status, unspecified: Secondary | ICD-10-CM

## 2021-09-14 DIAGNOSIS — Z833 Family history of diabetes mellitus: Secondary | ICD-10-CM

## 2021-09-14 DIAGNOSIS — R410 Disorientation, unspecified: Secondary | ICD-10-CM

## 2021-09-14 DIAGNOSIS — Z6836 Body mass index (BMI) 36.0-36.9, adult: Secondary | ICD-10-CM

## 2021-09-14 DIAGNOSIS — I69391 Dysphagia following cerebral infarction: Secondary | ICD-10-CM

## 2021-09-14 DIAGNOSIS — Z452 Encounter for adjustment and management of vascular access device: Secondary | ICD-10-CM | POA: Diagnosis not present

## 2021-09-14 DIAGNOSIS — N179 Acute kidney failure, unspecified: Secondary | ICD-10-CM

## 2021-09-14 DIAGNOSIS — E876 Hypokalemia: Secondary | ICD-10-CM | POA: Diagnosis present

## 2021-09-14 DIAGNOSIS — Z79899 Other long term (current) drug therapy: Secondary | ICD-10-CM

## 2021-09-14 DIAGNOSIS — N170 Acute kidney failure with tubular necrosis: Secondary | ICD-10-CM | POA: Diagnosis not present

## 2021-09-14 DIAGNOSIS — I6783 Posterior reversible encephalopathy syndrome: Secondary | ICD-10-CM | POA: Diagnosis present

## 2021-09-14 DIAGNOSIS — I674 Hypertensive encephalopathy: Secondary | ICD-10-CM | POA: Diagnosis not present

## 2021-09-14 DIAGNOSIS — G4489 Other headache syndrome: Secondary | ICD-10-CM | POA: Diagnosis not present

## 2021-09-14 DIAGNOSIS — Z91119 Patient's noncompliance with dietary regimen due to unspecified reason: Secondary | ICD-10-CM

## 2021-09-14 DIAGNOSIS — Z794 Long term (current) use of insulin: Secondary | ICD-10-CM

## 2021-09-14 DIAGNOSIS — Z7982 Long term (current) use of aspirin: Secondary | ICD-10-CM

## 2021-09-14 DIAGNOSIS — E119 Type 2 diabetes mellitus without complications: Secondary | ICD-10-CM

## 2021-09-14 DIAGNOSIS — Z800 Family history of malignant neoplasm of digestive organs: Secondary | ICD-10-CM

## 2021-09-14 DIAGNOSIS — N184 Chronic kidney disease, stage 4 (severe): Secondary | ICD-10-CM | POA: Diagnosis present

## 2021-09-14 DIAGNOSIS — N1831 Chronic kidney disease, stage 3a: Secondary | ICD-10-CM | POA: Diagnosis present

## 2021-09-14 DIAGNOSIS — E1142 Type 2 diabetes mellitus with diabetic polyneuropathy: Secondary | ICD-10-CM | POA: Diagnosis present

## 2021-09-14 DIAGNOSIS — N1832 Chronic kidney disease, stage 3b: Secondary | ICD-10-CM | POA: Diagnosis not present

## 2021-09-14 DIAGNOSIS — Z8042 Family history of malignant neoplasm of prostate: Secondary | ICD-10-CM

## 2021-09-14 DIAGNOSIS — R404 Transient alteration of awareness: Secondary | ICD-10-CM | POA: Diagnosis not present

## 2021-09-14 DIAGNOSIS — Z91199 Patient's noncompliance with other medical treatment and regimen due to unspecified reason: Secondary | ICD-10-CM

## 2021-09-14 LAB — CBC WITH DIFFERENTIAL/PLATELET
Abs Immature Granulocytes: 0.03 10*3/uL (ref 0.00–0.07)
Basophils Absolute: 0 10*3/uL (ref 0.0–0.1)
Basophils Relative: 0 %
Eosinophils Absolute: 0.1 10*3/uL (ref 0.0–0.5)
Eosinophils Relative: 1 %
HCT: 38.2 % — ABNORMAL LOW (ref 39.0–52.0)
Hemoglobin: 13.4 g/dL (ref 13.0–17.0)
Immature Granulocytes: 0 %
Lymphocytes Relative: 24 %
Lymphs Abs: 2.3 10*3/uL (ref 0.7–4.0)
MCH: 29.3 pg (ref 26.0–34.0)
MCHC: 35.1 g/dL (ref 30.0–36.0)
MCV: 83.6 fL (ref 80.0–100.0)
Monocytes Absolute: 0.4 10*3/uL (ref 0.1–1.0)
Monocytes Relative: 4 %
Neutro Abs: 6.9 10*3/uL (ref 1.7–7.7)
Neutrophils Relative %: 71 %
Platelets: 240 10*3/uL (ref 150–400)
RBC: 4.57 MIL/uL (ref 4.22–5.81)
RDW: 11.4 % — ABNORMAL LOW (ref 11.5–15.5)
WBC: 9.7 10*3/uL (ref 4.0–10.5)
nRBC: 0 % (ref 0.0–0.2)

## 2021-09-14 LAB — RESP PANEL BY RT-PCR (FLU A&B, COVID) ARPGX2
Influenza A by PCR: NEGATIVE
Influenza B by PCR: NEGATIVE
SARS Coronavirus 2 by RT PCR: NEGATIVE

## 2021-09-14 LAB — MRSA NEXT GEN BY PCR, NASAL: MRSA by PCR Next Gen: NOT DETECTED

## 2021-09-14 LAB — BASIC METABOLIC PANEL
Anion gap: 8 (ref 5–15)
BUN: 20 mg/dL (ref 6–20)
CO2: 24 mmol/L (ref 22–32)
Calcium: 8.5 mg/dL — ABNORMAL LOW (ref 8.9–10.3)
Chloride: 108 mmol/L (ref 98–111)
Creatinine, Ser: 3.19 mg/dL — ABNORMAL HIGH (ref 0.61–1.24)
GFR, Estimated: 22 mL/min — ABNORMAL LOW (ref >60)
Glucose, Bld: 110 mg/dL — ABNORMAL HIGH (ref 70–99)
Potassium: 3.1 mmol/L — ABNORMAL LOW (ref 3.5–5.1)
Sodium: 140 mmol/L (ref 135–145)

## 2021-09-14 LAB — AMMONIA: Ammonia: 25 umol/L (ref 9–35)

## 2021-09-14 LAB — COMPREHENSIVE METABOLIC PANEL
ALT: 15 U/L (ref 0–44)
AST: 19 U/L (ref 15–41)
Albumin: 3.8 g/dL (ref 3.5–5.0)
Alkaline Phosphatase: 200 U/L — ABNORMAL HIGH (ref 38–126)
Anion gap: 11 (ref 5–15)
BUN: 19 mg/dL (ref 6–20)
CO2: 26 mmol/L (ref 22–32)
Calcium: 9.3 mg/dL (ref 8.9–10.3)
Chloride: 99 mmol/L (ref 98–111)
Creatinine, Ser: 2.88 mg/dL — ABNORMAL HIGH (ref 0.61–1.24)
GFR, Estimated: 25 mL/min — ABNORMAL LOW (ref >60)
Glucose, Bld: 456 mg/dL — ABNORMAL HIGH (ref 70–99)
Potassium: 2.8 mmol/L — ABNORMAL LOW (ref 3.5–5.1)
Sodium: 136 mmol/L (ref 135–145)
Total Bilirubin: 1.4 mg/dL — ABNORMAL HIGH (ref 0.3–1.2)
Total Protein: 7.8 g/dL (ref 6.5–8.1)

## 2021-09-14 LAB — MAGNESIUM: Magnesium: 1.8 mg/dL (ref 1.7–2.4)

## 2021-09-14 LAB — CBG MONITORING, ED
Glucose-Capillary: 469 mg/dL — ABNORMAL HIGH (ref 70–99)
Glucose-Capillary: 277 mg/dL — ABNORMAL HIGH (ref 70–99)

## 2021-09-14 LAB — ETHANOL: Alcohol, Ethyl (B): <10 mg/dL (ref <10)

## 2021-09-14 LAB — HEMOGLOBIN A1C
Hgb A1c MFr Bld: 8.4 % — ABNORMAL HIGH (ref 4.8–5.6)
Mean Plasma Glucose: 194.38 mg/dL

## 2021-09-14 LAB — GLUCOSE, CAPILLARY
Glucose-Capillary: 255 mg/dL — ABNORMAL HIGH (ref 70–99)
Glucose-Capillary: 162 mg/dL — ABNORMAL HIGH (ref 70–99)
Glucose-Capillary: 103 mg/dL — ABNORMAL HIGH (ref 70–99)
Glucose-Capillary: 112 mg/dL — ABNORMAL HIGH (ref 70–99)

## 2021-09-14 MED ORDER — POTASSIUM CHLORIDE 10 MEQ/100ML IV SOLN
10.0000 meq | INTRAVENOUS | Status: DC
  Administered 2021-09-14: 10 meq via INTRAVENOUS

## 2021-09-14 MED ORDER — SERTRALINE HCL 50 MG PO TABS: 25.0000 mg | ORAL_TABLET | Freq: Every day | ORAL | Status: DC

## 2021-09-14 MED ORDER — POTASSIUM CHLORIDE 20 MEQ PO PACK
40.0000 meq | PACK | Freq: Once | ORAL | Status: AC
  Administered 2021-09-15: 40 meq
  Filled 2021-09-14: qty 2

## 2021-09-14 MED ORDER — LEVETIRACETAM 500 MG PO TABS: 1000.0000 mg | ORAL_TABLET | Freq: Two times a day (BID) | ORAL | Status: DC

## 2021-09-14 MED ORDER — INSULIN GLARGINE-YFGN 100 UNIT/ML ~~LOC~~ SOLN
30.0000 [IU] | Freq: Every day | SUBCUTANEOUS | Status: DC
  Administered 2021-09-14 – 2021-09-15 (×2): 30 [IU] via SUBCUTANEOUS
  Filled 2021-09-14 (×3): qty 0.3

## 2021-09-14 MED ORDER — INSULIN GLARGINE 100 UNIT/ML SOLOSTAR PEN: 30.0000 [IU] | PEN_INJECTOR | Freq: Every day | SUBCUTANEOUS | Status: DC

## 2021-09-14 MED ORDER — POTASSIUM CHLORIDE 10 MEQ/100ML IV SOLN
10.0000 meq | INTRAVENOUS | Status: DC
  Filled 2021-09-14: qty 100

## 2021-09-14 MED ORDER — ZIPRASIDONE MESYLATE 20 MG IM SOLR
INTRAMUSCULAR | Status: AC
  Administered 2021-09-14: 20 mg via INTRAMUSCULAR
  Filled 2021-09-14: qty 20

## 2021-09-14 MED ORDER — LORAZEPAM 2 MG/ML IJ SOLN
2.0000 mg | Freq: Once | INTRAMUSCULAR | Status: DC
  Filled 2021-09-14: qty 1

## 2021-09-14 MED ORDER — SERTRALINE HCL 50 MG PO TABS
25.0000 mg | ORAL_TABLET | Freq: Every day | ORAL | Status: DC
  Administered 2021-09-15: 25 mg
  Filled 2021-09-14: qty 1

## 2021-09-14 MED ORDER — MAGNESIUM SULFATE 2 GM/50ML IV SOLN
2.0000 g | Freq: Once | INTRAVENOUS | Status: AC
  Administered 2021-09-14: 2 g via INTRAVENOUS
  Filled 2021-09-14: qty 50

## 2021-09-14 MED ORDER — LEVETIRACETAM IN NACL 1000 MG/100ML IV SOLN
1000.0000 mg | INTRAVENOUS | Status: DC
  Filled 2021-09-14 (×2): qty 100

## 2021-09-14 MED ORDER — SODIUM CHLORIDE 0.9 % IV BOLUS
1000.0000 mL | Freq: Once | INTRAVENOUS | Status: AC
  Administered 2021-09-14: 1000 mL via INTRAVENOUS

## 2021-09-14 MED ORDER — HEPARIN SODIUM (PORCINE) 5000 UNIT/ML IJ SOLN
5000.0000 [IU] | Freq: Three times a day (TID) | INTRAMUSCULAR | Status: DC
  Administered 2021-09-14 – 2021-09-21 (×21): 5000 [IU] via SUBCUTANEOUS
  Filled 2021-09-14 (×21): qty 1

## 2021-09-14 MED ORDER — LEVETIRACETAM 100 MG/ML PO SOLN
1000.0000 mg | Freq: Two times a day (BID) | ORAL | Status: DC
  Administered 2021-09-14 – 2021-09-15 (×2): 1000 mg
  Filled 2021-09-14 (×2): qty 10

## 2021-09-14 MED ORDER — HYDRALAZINE HCL 50 MG PO TABS
25.0000 mg | ORAL_TABLET | Freq: Three times a day (TID) | ORAL | Status: DC
  Administered 2021-09-14 – 2021-09-15 (×4): 25 mg
  Filled 2021-09-14 (×4): qty 1

## 2021-09-14 MED ORDER — MAGNESIUM OXIDE -MG SUPPLEMENT 400 (240 MG) MG PO TABS
400.0000 mg | ORAL_TABLET | Freq: Once | ORAL | Status: AC
  Administered 2021-09-15: 400 mg via NASOGASTRIC
  Filled 2021-09-14: qty 1

## 2021-09-14 MED ORDER — PANTOPRAZOLE SODIUM 40 MG IV SOLR
40.0000 mg | Freq: Every day | INTRAVENOUS | Status: DC
  Administered 2021-09-14: 40 mg via INTRAVENOUS
  Filled 2021-09-14: qty 40

## 2021-09-14 MED ORDER — ONDANSETRON HCL 4 MG/2ML IJ SOLN: 4.0000 mg | Freq: Four times a day (QID) | INTRAMUSCULAR | Status: DC | PRN

## 2021-09-14 MED ORDER — DIPHENHYDRAMINE HCL 50 MG/ML IJ SOLN
50.0000 mg | Freq: Once | INTRAMUSCULAR | Status: DC
  Filled 2021-09-14: qty 1

## 2021-09-14 MED ORDER — QUETIAPINE FUMARATE 25 MG PO TABS
25.0000 mg | ORAL_TABLET | Freq: Every day | ORAL | Status: DC
  Administered 2021-09-14: 25 mg
  Filled 2021-09-14: qty 1

## 2021-09-14 MED ORDER — ATORVASTATIN CALCIUM 40 MG PO TABS: 40.0000 mg | ORAL_TABLET | Freq: Every day | ORAL | Status: DC

## 2021-09-14 MED ORDER — ZIPRASIDONE MESYLATE 20 MG IM SOLR: 20.0000 mg | Freq: Once | INTRAMUSCULAR | Status: AC

## 2021-09-14 MED ORDER — POTASSIUM CHLORIDE 10 MEQ/100ML IV SOLN
10.0000 meq | INTRAVENOUS | Status: DC
  Administered 2021-09-14 (×2): 10 meq via INTRAVENOUS
  Filled 2021-09-14 (×2): qty 100

## 2021-09-14 MED ORDER — AMLODIPINE BESYLATE 10 MG PO TABS
10.0000 mg | ORAL_TABLET | Freq: Every day | ORAL | Status: DC
  Administered 2021-09-14 – 2021-09-15 (×2): 10 mg
  Filled 2021-09-14 (×2): qty 1

## 2021-09-14 MED ORDER — HALOPERIDOL LACTATE 5 MG/ML IJ SOLN
5.0000 mg | Freq: Four times a day (QID) | INTRAMUSCULAR | Status: DC | PRN
  Administered 2021-09-16: 5 mg via INTRAVENOUS
  Filled 2021-09-14: qty 1

## 2021-09-14 MED ORDER — NICARDIPINE HCL IN NACL 20-0.86 MG/200ML-% IV SOLN
3.0000 mg/h | INTRAVENOUS | Status: DC
  Administered 2021-09-14: 7.5 mg/h via INTRAVENOUS
  Administered 2021-09-14: 5 mg/h via INTRAVENOUS
  Administered 2021-09-14 (×3): 7.5 mg/h via INTRAVENOUS
  Administered 2021-09-15: 2.5 mg/h via INTRAVENOUS
  Administered 2021-09-15: 5 mg/h via INTRAVENOUS
  Filled 2021-09-14 (×7): qty 200

## 2021-09-14 MED ORDER — QUETIAPINE FUMARATE 25 MG PO TABS: 25.0000 mg | ORAL_TABLET | Freq: Every day | ORAL | Status: DC

## 2021-09-14 MED ORDER — LORAZEPAM 2 MG/ML IJ SOLN
1.0000 mg | Freq: Once | INTRAMUSCULAR | Status: AC
  Administered 2021-09-14: 1 mg via INTRAVENOUS
  Filled 2021-09-14: qty 1

## 2021-09-14 MED ORDER — TAMSULOSIN HCL 0.4 MG PO CAPS
0.4000 mg | ORAL_CAPSULE | Freq: Every day | ORAL | Status: DC
  Administered 2021-09-15 – 2021-09-21 (×7): 0.4 mg via ORAL
  Filled 2021-09-14 (×7): qty 1

## 2021-09-14 MED ORDER — STERILE WATER FOR INJECTION IJ SOLN
INTRAMUSCULAR | Status: AC
  Filled 2021-09-14: qty 10

## 2021-09-14 MED ORDER — HYDRALAZINE HCL 20 MG/ML IJ SOLN
10.0000 mg | Freq: Once | INTRAMUSCULAR | Status: AC
  Administered 2021-09-14: 10 mg via INTRAVENOUS
  Filled 2021-09-14: qty 1

## 2021-09-14 MED ORDER — DIPHENHYDRAMINE HCL 50 MG/ML IJ SOLN
25.0000 mg | Freq: Once | INTRAMUSCULAR | Status: AC
  Administered 2021-09-14: 25 mg via INTRAVENOUS
  Filled 2021-09-14: qty 1

## 2021-09-14 MED ORDER — ASPIRIN 81 MG PO CHEW
81.0000 mg | CHEWABLE_TABLET | Freq: Every day | ORAL | Status: DC
  Administered 2021-09-14 – 2021-09-15 (×2): 81 mg
  Filled 2021-09-14 (×2): qty 1

## 2021-09-14 MED ORDER — CHLORHEXIDINE GLUCONATE CLOTH 2 % EX PADS
6.0000 | MEDICATED_PAD | Freq: Every day | CUTANEOUS | Status: DC
  Administered 2021-09-14 – 2021-09-20 (×7): 6 via TOPICAL

## 2021-09-14 MED ORDER — POLYETHYLENE GLYCOL 3350 17 G PO PACK: 17.0000 g | PACK | Freq: Every day | ORAL | Status: DC | PRN

## 2021-09-14 MED ORDER — INSULIN ASPART 100 UNIT/ML IJ SOLN
0.0000 [IU] | INTRAMUSCULAR | Status: DC
  Administered 2021-09-14: 4 [IU] via SUBCUTANEOUS
  Administered 2021-09-14: 11 [IU] via SUBCUTANEOUS
  Administered 2021-09-15 (×3): 4 [IU] via SUBCUTANEOUS
  Administered 2021-09-15 – 2021-09-16 (×2): 3 [IU] via SUBCUTANEOUS
  Administered 2021-09-16: 4 [IU] via SUBCUTANEOUS
  Administered 2021-09-16 (×2): 3 [IU] via SUBCUTANEOUS
  Administered 2021-09-17 – 2021-09-19 (×7): 4 [IU] via SUBCUTANEOUS
  Administered 2021-09-19 (×2): 3 [IU] via SUBCUTANEOUS
  Administered 2021-09-19: 4 [IU] via SUBCUTANEOUS
  Administered 2021-09-20 (×2): 3 [IU] via SUBCUTANEOUS
  Administered 2021-09-20: 7 [IU] via SUBCUTANEOUS
  Administered 2021-09-20: 3 [IU] via SUBCUTANEOUS
  Administered 2021-09-20: 4 [IU] via SUBCUTANEOUS
  Administered 2021-09-21: 3 [IU] via SUBCUTANEOUS

## 2021-09-14 MED ORDER — DOCUSATE SODIUM 100 MG PO CAPS
100.0000 mg | ORAL_CAPSULE | Freq: Two times a day (BID) | ORAL | Status: DC | PRN
  Administered 2021-09-16 – 2021-09-18 (×2): 100 mg via ORAL
  Filled 2021-09-14 (×3): qty 1

## 2021-09-14 MED ORDER — LORAZEPAM 2 MG/ML IJ SOLN
1.0000 mg | Freq: Once | INTRAMUSCULAR | Status: AC | PRN
  Administered 2021-09-16: 1 mg via INTRAVENOUS
  Filled 2021-09-14 (×2): qty 1

## 2021-09-14 MED ORDER — ASPIRIN EC 81 MG PO TBEC: 81.0000 mg | DELAYED_RELEASE_TABLET | Freq: Every day | ORAL | Status: DC

## 2021-09-14 NOTE — ED Notes (Signed)
CHECKED PATIENT CBG IT WAS 277 PATIENT IS RESTING WITH FAMILY AT BEDSIDE

## 2021-09-14 NOTE — ED Notes (Signed)
Pt incontinent of urine, attempted to change sheets but pt swinging, cursing at staff. Able to remove most of the wet sheets out but unable to fully change bottom sheet. Pt to transfer to ICU shortly, will fully remove wet linen on transfer to ICU bed.

## 2021-09-14 NOTE — ED Notes (Signed)
IV attempted without success. Pt resting, CT delayed due to initial agitation

## 2021-09-14 NOTE — ED Triage Notes (Signed)
Patient arrived with EMS from home spouse reported elevated blood sugar at 3am this morning CBG="High" , received Novolog 18 units sq prior to arrival , confused and disoriented , BP= 220/120 by EMS . CBG=469 at triage .

## 2021-09-14 NOTE — Progress Notes (Signed)
[facility] Progress Note Patient Name: Brandon [name] DOB: [birth year] MRN: [id number]   Date of Service  09/14/2021  HPI/Events of Note  Mag and K low  eICU Interventions  Ordered mag and K via OGT     Intervention Category Minor Interventions: Electrolytes abnormality - evaluation and management  Tilden Dome 09/14/2021, 11:18 PM

## 2021-09-14 NOTE — Progress Notes (Signed)
Patient unavailable for routine EEG.  He is relocating to 2M10.  We will re-attempt when he is on the floor.

## 2021-09-14 NOTE — ED Provider Notes (Signed)
  Physical Exam  BP (!) 224/128   Pulse (!) 109   Temp 98.7 F (37.1 C) (Oral)   Resp 18   SpO2 100%   Physical Exam Vitals and nursing note reviewed.  Constitutional:      Appearance: He is well-developed. He is ill-appearing.  HENT:     Head: Normocephalic and atraumatic.  Eyes:     Conjunctiva/sclera: Conjunctivae normal.  Cardiovascular:     Rate and Rhythm: Normal rate and regular rhythm.     Heart sounds: No murmur heard. Pulmonary:     Effort: Pulmonary effort is normal. No respiratory distress.     Breath sounds: Normal breath sounds.  Abdominal:     Palpations: Abdomen is soft.     Tenderness: There is no abdominal tenderness.  Musculoskeletal:     Cervical back: Neck supple.  Skin:    General: Skin is warm and dry.  Neurological:     Mental Status: He is alert. He is disoriented.    ED Course/Procedures   Clinical Course as of 09/14/21 1306  Tue Sep 14, 2021  0538 Patient severely agitated.  Not following commands.  Required Geodon and subsequently Ativan and Benadryl just in order to keep him in bed and obtain IV access.  History of similar symptoms.  Was admitted to the ICU and required Precedex for encephalopathy. [CH]  0542 Patient now somewhat less agitated.  He now has IV access.  Repeat blood pressure significantly elevated.  Concern for press.  He will need CT scanning.  Patient was given dose of IV hydralazine.  Previously has required nicardipine drip for blood pressure control and management. [CH]    Clinical Course User Index [CH] Horton, Barbette Hair, MD    Procedures  MDM  Patient received in handoff.  Concern for hypertensive emergency and pres.  Pending CT imaging and hydralazine trial at time of signout.  On reevaluation, patient remains hypertensive with systolics greater than 371 and patient was started on Cardene drip.  Head CT with old stroke.  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       Teressa Lower, MD 09/14/21 1308

## 2021-09-14 NOTE — Progress Notes (Signed)
Patient's NGT advanced approximately 10cm per order from [facility] NP. Also per verbal order no need for new abdominal xray after advancement. Okay to give meds via NGT at this time.

## 2021-09-14 NOTE — Progress Notes (Signed)
EEG completed, results pending. 

## 2021-09-14 NOTE — Procedures (Signed)
Patient Name: Brandon [name]  MRN: [id number]  Epilepsy Attending: Lora Havens  Referring Physician/Provider: Dr Ina Homes Date: 09/14/2021 Duration: 24.27 mins  Patient history: 60 year old man with history of poorly controlled DM, TBI, PTSD, probable schizophrenia, HTN presenting with AMS and combativeness. EEG to evaluate for seizure.   Level of alertness: lethargic   AEDs during EEG study: LEV  Technical aspects: This EEG study was done with scalp electrodes positioned according to the 10-20 International system of electrode placement. Electrical activity was acquired at a sampling rate of 500Hz  and reviewed with a high frequency filter of 70Hz  and a low frequency filter of 1Hz . EEG data were recorded continuously and digitally stored.   Description: No posterior dominant rhythm was seen. EEG showed continuous generalized 3 to 6 Hz theta-delta slowing. Hyperventilation and photic stimulation were not performed.     Of note, study was technically difficult due to significant movement artifact.  ABNORMALITY - Continuous slow, generalized  IMPRESSION: This technically difficult study is suggestive of moderate diffuse encephalopathy, nonspecific etiology. No seizures or epileptiform discharges were seen throughout the recording.  Sloan Galentine Barbra Sarks

## 2021-09-14 NOTE — Progress Notes (Signed)
IV team has notified Dr. Tamala Julian, Rahul PA and patient nurse that there are no appropriate veins for IV access. D/t renal function unable to place PICC or midline. Fran Lowes, RN VAST

## 2021-09-14 NOTE — ED Notes (Signed)
RN transferred pt to ICU on monitor. Pt's wife took all of pt's belongings.

## 2021-09-14 NOTE — ED Notes (Signed)
Pt agitate, continues to climb out of bed, redirection has failed on multiple attempts. Pt getting aggressive when attempting to get back in bed. Pt holding his head. Incontinent of stool.

## 2021-09-14 NOTE — Progress Notes (Signed)
Dear Doctor: This patient has been identified as a candidate for CVC for the following reason (s): renal function and incompatible medications, poor vasculature. If you agree, please write an order for the indicated device.   Thank you for supporting the early vascular access assessment program.

## 2021-09-14 NOTE — ED Notes (Signed)
Cardene gtt initiated prior to transport to CT, RN and CT tech transported pt to CT on monitoring equipment, CT performed without complication. Back to room, RN titrating cardene gtt. Pt significant other at bedside.

## 2021-09-14 NOTE — H&P (Addendum)
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  History obtained per chart review and patient's wife due to his altered mental status.  His wife called EMS because of a high blood glucose at 3 AM this morning.  She gave him NovoLog 18 units prior to EMS arrival.  She reports he was confused and disoriented, acting abnormal at home.  Compliance with his medications has been an ongoing issue.  Wife reports that he is presented similarly in the setting of high blood pressure and high blood sugars in the past.  He was recently hospitalized with hypertensive encephalopathy in July 2022.   On EMS arrival his blood pressure was 220/120 and blood sugar was 469 on arrival to ED.  He was severely agitated in the ED requiring Geodon and subsequently Ativan and Benadryl to keep him in bed and obtain IV access.  Wife states that he started acting abnormal at home and suspects that he has not been taking his medications.  This has been an ongoing issue with multiple hospitalizations.  SIGNIFICANT PAST MEDICAL HISTORY   Hypertension Type 2 diabetes Schizophrenia CKD stage III Hyperlipidemia  SIGNIFICANT EVENTS:  10/11 admitted to the ICU on Cardene infusion  STUDIES:   10/11 CT head negative for acute etiology.  Remote right MCA infarct  CULTURES:  None  ANTIBIOTICS:  None  LINES/TUBES:  Peripheral IV   CONSULTANTS:  None  SUBJECTIVE:  History obtained per chart review and via patient's wife.  He is altered on exam.  Unable to participate.  He has been  acting confused and disoriented at home according to the wife.  She states that this happened in the past when he does not take his medications and his blood sugars and blood pressures remain high.  She has considered a skilled nursing facility for him to ensure that he is taking his medications.  CONSTITUTIONAL: BP (!) 158/92   Pulse (!) 101   Temp 98.7 F (37.1 C) (Oral)   Resp 14   SpO2 99%   I/O last 3 completed shifts: In: 1000 [IV Piggyback:1000] Out: -    PHYSICAL EXAM: General: Ill-appearing, no acute distress, altered Neuro: Altered mentation, moving all 4 extremities HEENT: Normocephalic, atraumatic, moist mucous membranes, PERRLA Cardiovascular: Tachycardic, regular rhythm, no murmurs Lungs: Clear to auscultation bilaterally, normal effort on room air Abdomen: Soft, nontender, nondistended Musculoskeletal: No [name] extremity edema Skin: warm and dry  RESOLVED PROBLEM LIST   ASSESSMENT AND PLAN   Hypertensive emergency Acute encephalopathy Patient presented severely hypertensive and altered started on Cardene drip.  Likely in the setting of noncompliance to his medications. Home medications include hydralazine.  Alcohol and ammonia level within normal limits.  CT head negative for acute stroke or press.  He has had multiple similar presentations and recently hospitalized with hypertensive encephalopathy in July of this year.  He did require Precedex during previous hospitalizations.  Will admit to the ICU and continue Cardene infusion. -Cardiac monitoring -Continue Cardene infusion -Trend blood pressure -Follow-up urine drug screen  Severe hyperglycemia versus HHS type 2 diabetes Blood glucose 469 on admission.  Likely due to noncompliance.  No anion gap.  Will resume Lantus and sliding scale. -CBG monitoring -Sliding scale and Lantus -Can consider Endo tool if blood sugars remain uncontrolled  Schizophrenia Medications include Seroquel and Zoloft.  He was given  Geodon and subsequently Ativan and Benadryl for severe agitation in the emergency room.   -Resume home medications once tolerating orals -As needed Haldol on board  Hypokalemia Potassium 2.8.  Replete as needed -Trend BMP -Follow-up UA  AKI on CKD stage III Creatinine 2.88, baseline closer to 2.5 recently. -Trend BMP  Elevated LFTs Alkaline phosphatase elevated at 200, total bili 1.4.   -Trend CMP  History of seizures No seizure since 2016.  Continue Keppra.  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  No acute stroke on CT head today. -Continue aspirin 81 mg daily once tolerating orals  Hyperlipidemia -Continue atorvastatin and Zetia once tolerating orals   Best Practice / Goals of Care / Disposition.   DVT PROPHYLAXIS: Heparin DIET: N.p.o. MOBILITY: Bedrest GOALS OF CARE: ICU, full code FAMILY DISCUSSIONS: Disposition discussed with wife at bedside DISPOSITION ICU  LABS  Glucose Recent Labs  Lab 09/14/21 0442  GLUCAP 469*    BMET Recent Labs  Lab 09/14/21 0449  NA 136  K 2.8*  CL 99  CO2 26  BUN 19  CREATININE 2.88*  GLUCOSE 456*    Liver Enzymes Recent Labs  Lab 09/14/21 0449  AST 19  ALT 15  ALKPHOS 200*  BILITOT 1.4*  ALBUMIN 3.8    Electrolytes Recent Labs  Lab 09/14/21 0449  CALCIUM 9.3    CBC Recent Labs  Lab 09/14/21 0449  WBC 9.7  HGB 13.4  HCT 38.2*  PLT 240    ABG No results for input(s): PHART, PCO2ART, PO2ART in the last 168 hours.  Coag's No results for input(s): APTT, INR in the last 168 hours.  Sepsis Markers No results for input(s): LATICACIDVEN, PROCALCITON, O2SATVEN in the last 168 hours.  Cardiac Enzymes No results for input(s): TROPONINI, PROBNP in the last 168 hours.  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  PAST SURGICAL HISTORY:  He  has a past surgical history that includes Kidney surgery; TEE without cardioversion (N/A, 11/19/2013); Colonoscopy; and IR Radiologist Eval & Mgmt (04/04/2017).  Allergies  Allergen Reactions   Morphine Itching    No current facility-administered medications on file prior to encounter.   Current Outpatient Medications on File Prior to Encounter  Medication Sig   aspirin EC 81 MG tablet Take 1 tablet (81 mg total) by mouth daily.   atorvastatin (LIPITOR) 40 MG tablet Take 1 tablet (40 mg total) by mouth daily.   blood glucose meter kit and supplies Dispense based on patient and insurance preference. Use up to four times daily as directed. (FOR ICD-9 250.00, 250.01). (Patient taking differently: 1 each by Other route See admin instructions. Dispense based on patient and insurance preference. Use up to four times daily as directed. (FOR ICD-9 250.00, 250.01).)   ezetimibe (ZETIA) 10 MG tablet Take 1 tablet (10 mg total)  by mouth daily.   glucose blood (ACCU-CHEK ACTIVE STRIPS) test strip Use as instructed (Patient taking differently: 1 each by Other route as directed.)   glucose blood test strip accu chek active 1-2 times daily (Patient taking differently: 1 each by Other route See admin instructions. accu chek active 1-2 times daily)   hydrALAZINE (APRESOLINE) 50 MG tablet Take 1 tablet (50 mg total) by mouth in the morning and at bedtime.   insulin aspart (NOVOLOG) 100 UNIT/ML FlexPen Inject 2-10 Units into the skin 3 (three) times daily with meals. If glucose 150-200 take 2 units, glucose 201-250 take 4 units,  251-300 take 6 units, glucose 301-350 take 8 units, glucose 351-400 take 10 units   insulin glargine (LANTUS) 100 UNIT/ML Solostar Pen Inject 40 Units into the skin daily.   Insulin Pen Needle (NOVOFINE PLUS PEN NEEDLE) 32G X 4 MM MISC Use as directed with insulin pen   Lancets (ACCU-CHEK SOFT TOUCH) lancets Use as instructed (Patient taking differently: 1 each by Other route as directed.)   levETIRAcetam (KEPPRA) 500 MG tablet Take 2 tablets (1,000 mg total) by mouth daily.   omeprazole (PRILOSEC) 40 MG capsule Take 1 capsule (40 mg total) by mouth daily.   ondansetron (ZOFRAN) 4 MG tablet Take 1 tablet (4 mg total) by mouth every 8 (eight) hours as needed for nausea or vomiting.   QUEtiapine (SEROQUEL) 25 MG tablet Take 1 tablet (25 mg total) by mouth at bedtime.   sertraline (ZOLOFT) 25 MG tablet Take 1 tablet (25 mg total) by mouth daily.   sildenafil (VIAGRA) 50 MG tablet Take 1 tablet (50 mg total) by mouth as needed for erectile dysfunction.   tamsulosin (FLOMAX) 0.4 MG CAPS capsule Take 1 capsule (0.4 mg total) by mouth daily.   UNABLE TO FIND Med Name:  Custom diabetic shoes  Dx: E11.42    FAMILY HISTORY:   His family history includes Breast cancer in his sister; Diabetes in his father; Heart disease in his father; Pneumonia in his mother; Prostate cancer in an other family member; Stomach cancer in an other family member. There is no history of Colon cancer, Esophageal cancer, or Rectal cancer.  SOCIAL HISTORY:  He  reports that he has never smoked. He has never used smokeless tobacco. He reports that he does not currently use alcohol. He reports that he does not use drugs.  REVIEW OF SYSTEMS:    Unable to obtain due to altered mental status     Brandon Murnane Laural Golden, DO PGY-3 IMTS

## 2021-09-14 NOTE — TOC Progression Note (Signed)
Transition of Care [facility]) - Initial/Assessment Note    Patient Details  Name: Brandon [name] MRN: [id number] Date of Birth: 1961/11/30  Transition of Care [facility]) CM/SW Contact:    Milinda Antis, [place] Phone Number: 09/14/2021, 4:38 PM  Clinical Narrative:                 CSW received consult for SNF placement and performed chart review.  Patient was admitted to the ICU for hypertensive emergency and concern for posterior reversible encephalopathy syndrome.  A potential barrier to SNF placement will be the patient's mental health history (PTSD, TBI, schizophrenia).  CSW following patient for any d/c planning needs once medically stable.  Lind Covert, MSW, LCSWA    Patient Goals and CMS Choice        Expected Discharge Plan and Services                                                Prior Living Arrangements/Services                       Activities of Daily Living      Permission Sought/Granted                  Emotional Assessment              Admission diagnosis:  Encephalopathy acute [G93.40] Hyperglycemia [R73.9] Hypertensive emergency [I16.1] Patient Active Problem List   Diagnosis Date Noted   Hypertensive emergency 09/14/2021   Testicular pain, right 09/18/2020   Nausea and vomiting 09/18/2020   Abdominal discomfort 09/18/2020   Hypoglycemia 09/18/2020   Polypharmacy 09/18/2020   Testicular pain, left 04/16/2020   Pelvic pain 04/16/2020   Anemia 04/16/2020   Anxiety 04/16/2020   Depressed mood 04/16/2020   Acute renal failure superimposed on stage 3a chronic kidney disease ([place]) 04/06/2020   Abnormal radiologic findings on diagnostic imaging of right testicle 04/06/2020   Type 2 diabetes mellitus with diabetic polyneuropathy, with long-term current use of insulin ([employer]) 02/09/2020   Type 2 diabetes mellitus with stage 3a chronic kidney disease, with long-term current use of insulin ([place]) 02/09/2020   Family history  of premature CAD 01/27/2020   Stage 3b chronic kidney disease ([place]) 01/09/2020   Incontinence of feces 01/09/2020   Chronic pain of left knee 01/09/2020   Left leg pain 01/09/2020   Fall 01/09/2020   History of diverticulitis 10/30/2019   Noncompliance 06/10/2019   History of renal cell cancer 04/24/2018   Erectile dysfunction 04/24/2018   Edema 03/15/2018   Seizure disorder ([place]) 03/01/2018   History of stroke 11/29/2017   Obstructive sleep apnea 11/29/2017   Uncontrolled diabetes mellitus 05/10/2017   Dyslipidemia associated with type 2 diabetes mellitus ([place]) 05/10/2017   Hypertension associated with diabetes ([place]) 05/10/2017   History of cerebrovascular accident (CVA) with residual deficit 05/10/2017   Diabetic ulcer of left foot associated with secondary diabetes mellitus ([place]) 04/21/2016   History of seizure 01/05/2016   Depression, recurrent ([place]) 09/21/2014   Benign hypertension with CKD (chronic kidney disease) stage III ([place]) 12/30/2011   Side pain 12/30/2011   PCP:  Carlena Hurl, PA-C Pharmacy:   [id number] - [address], [place] [address] Phone: [phone] Fax: [phone]     Social Determinants of Health (SDOH)  Interventions    Readmission Risk Interventions Readmission Risk Prevention Plan 07/09/2021  Transportation Screening Complete  PCP or Specialist Appt within 3-5 Days Complete  HRI or Home Care Consult Complete  Social Work Consult for [place] Planning/Counseling Complete  Palliative Care Screening Not Applicable  Some recent data might be hidden

## 2021-09-14 NOTE — ED Provider Notes (Signed)
[facility] EMERGENCY DEPARTMENT Provider Note   CSN: [id number] Arrival date & time: 09/14/21  0434     History Chief Complaint  Patient presents with   Hyperglycemia/Hypertension     Brandon [name] is a 60 y.o. male.  HPI     This a 60 year old male with a history of stroke, diabetes with prior DKA and HHS, hypertension, hyperlipidemia, history of metabolic encephalopathy who presents with altered mental status.  Patient brought in by EMS.  Per EMS reports, spouse reported high blood sugars at home and confusion and disorientation.  He was given 18 units of NovoLog prior to transport.  Blood pressure was notably elevated in route.  On my evaluation, the patient is confused, he is speaking nonsensically.  Wife states that he has done this before in the setting of high blood pressure and high blood sugars.  She also reports he has autistic tendencies and has not taken any of his meds today.  She denies any recent illnesses  Level 5 caveat for altered mental status  Chart review reveals prior admissions for metabolic encephalopathy, HHS, and hypertensive urgency with similar symptoms.  Past Medical History:  Diagnosis Date   Acute ischemic stroke ([place]) 11/2013   Allergy    Anxiety    Arthritis    Benign hypertension with CKD (chronic kidney disease) stage III (HCC)    Bil Renal Ca dx'd 09/2011 & 11/2011   left and right; cryoablation bil   CVA (cerebral vascular accident) ([place])    stroke   Depression    BH Adm in [place] Depression   Diabetes mellitus    DKA prior hospitalization   Diverticulitis    s/p micorperforation Sept 2012-managed conservatively by Gen surgery   ED (erectile dysfunction)    Focal seizure ([place]) 11/2013   due to ischemic stroke   GERD (gastroesophageal reflux disease)    Hiatal hernia    Hyperlipidemia    Hypertension    Kidney tumor 09/2011   Renal cell CA   Seizures ([place])    none since 2016, taking Keppra - maw    Sleep apnea    Thyroid disease    "weak thyroid" per MD   Wears glasses     Patient Active Problem List   Diagnosis Date Noted   Testicular pain, right 09/18/2020   Nausea and vomiting 09/18/2020   Abdominal discomfort 09/18/2020   Hypoglycemia 09/18/2020   Polypharmacy 09/18/2020   Testicular pain, left 04/16/2020   Pelvic pain 04/16/2020   Anemia 04/16/2020   Anxiety 04/16/2020   Depressed mood 04/16/2020   Acute renal failure superimposed on stage 3a chronic kidney disease ([place]) 04/06/2020   Abnormal radiologic findings on diagnostic imaging of right testicle 04/06/2020   Type 2 diabetes mellitus with diabetic polyneuropathy, with long-term current use of insulin ([place]) 02/09/2020   Type 2 diabetes mellitus with stage 3a chronic kidney disease, with long-term current use of insulin ([place]) 02/09/2020   Family history of premature CAD 01/27/2020   Stage 3b chronic kidney disease ([place]) 01/09/2020   Incontinence of feces 01/09/2020   Chronic pain of left knee 01/09/2020   Left leg pain 01/09/2020   Fall 01/09/2020   History of diverticulitis 10/30/2019   Noncompliance 06/10/2019   History of renal cell cancer 04/24/2018   Erectile dysfunction 04/24/2018   Edema 03/15/2018   Seizure disorder ([place]) 03/01/2018   History of stroke 11/29/2017   Obstructive sleep apnea 11/29/2017   Uncontrolled diabetes mellitus 05/10/2017  Dyslipidemia associated with type 2 diabetes mellitus ([place]) 05/10/2017   Hypertension associated with diabetes ([place]) 05/10/2017   History of cerebrovascular accident (CVA) with residual deficit 05/10/2017   Diabetic ulcer of left foot associated with secondary diabetes mellitus ([place]) 04/21/2016   History of seizure 01/05/2016   Depression, recurrent ([place]) 09/21/2014   Benign hypertension with CKD (chronic kidney disease) stage III ([place]) 12/30/2011   Side pain 12/30/2011    Past Surgical History:  Procedure Laterality Date   COLONOSCOPY     IR RADIOLOGIST  EVAL & MGMT  04/04/2017   KIDNEY SURGERY     ablation of renal cell CA - 12/28, prior one was October [place]   TEE WITHOUT CARDIOVERSION N/A 11/19/2013   Procedure: TRANSESOPHAGEAL ECHOCARDIOGRAM (TEE);  Surgeon: Lelon Perla, MD;  Location: [facility] ENDOSCOPY;  Service: Cardiovascular;  Laterality: N/A;       Family History  Problem Relation Age of Onset   Diabetes Father    Heart disease Father    Pneumonia Mother    Prostate cancer Other    Stomach cancer Other    Breast cancer Sister    Colon cancer Neg Hx    Esophageal cancer Neg Hx    Rectal cancer Neg Hx     Social History   Tobacco Use   Smoking status: Never   Smokeless tobacco: Never  Vaping Use   Vaping Use: Never used  Substance Use Topics   Alcohol use: Not Currently    Comment: rarely   Drug use: No    Home Medications Prior to Admission medications   Medication Sig Start Date End Date Taking? Authorizing Provider  aspirin EC 81 MG tablet Take 1 tablet (81 mg total) by mouth daily. 07/10/21 10/08/21  [place] Donnamarie Poag, DO  atorvastatin (LIPITOR) 40 MG tablet Take 1 tablet (40 mg total) by mouth daily. 07/10/21 10/08/21  [place] Eric J, DO  blood glucose meter kit and supplies Dispense based on patient and insurance preference. Use up to four times daily as directed. (FOR ICD-9 250.00, 250.01). Patient taking differently: 1 each by Other route See admin instructions. Dispense based on patient and insurance preference. Use up to four times daily as directed. (FOR ICD-9 250.00, 250.01). 04/24/16   Eugenie Filler, MD  ezetimibe (ZETIA) 10 MG tablet Take 1 tablet (10 mg total) by mouth daily. 07/10/21 10/08/21  [place] Donnamarie Poag, DO  glucose blood (ACCU-CHEK ACTIVE STRIPS) test strip Use as instructed Patient taking differently: 1 each by Other route as directed. 06/11/19   Tysinger, Camelia Eng, PA-C  glucose blood test strip accu chek active 1-2 times daily Patient taking differently: 1 each by Other route See admin  instructions. accu chek active 1-2 times daily 01/17/20   Tysinger, Camelia Eng, PA-C  hydrALAZINE (APRESOLINE) 50 MG tablet Take 1 tablet (50 mg total) by mouth in the morning and at bedtime. 07/10/21 10/08/21  [place] Donnamarie Poag, DO  insulin aspart (NOVOLOG) 100 UNIT/ML FlexPen Inject 2-10 Units into the skin 3 (three) times daily with meals. If glucose 150-200 take 2 units, glucose 201-250 take 4 units, 251-300 take 6 units, glucose 301-350 take 8 units, glucose 351-400 take 10 units 07/10/21 10/08/21  [place] Eric J, DO  insulin glargine (LANTUS) 100 UNIT/ML Solostar Pen Inject 40 Units into the skin daily. 07/10/21 10/08/21  [place] Donnamarie Poag, DO  Insulin Pen Needle (NOVOFINE PLUS PEN NEEDLE) 32G X 4 MM MISC Use as directed with insulin pen 07/10/21   [place]  Donnamarie Poag, DO  Lancets (ACCU-CHEK SOFT TOUCH) lancets Use as instructed Patient taking differently: 1 each by Other route as directed. 06/11/19   Tysinger, Camelia Eng, PA-C  levETIRAcetam (KEPPRA) 500 MG tablet Take 2 tablets (1,000 mg total) by mouth daily. 07/10/21 10/08/21  [place] Donnamarie Poag, DO  omeprazole (PRILOSEC) 40 MG capsule Take 1 capsule (40 mg total) by mouth daily. 07/10/21 10/08/21  [place] Eric J, DO  ondansetron (ZOFRAN) 4 MG tablet Take 1 tablet (4 mg total) by mouth every 8 (eight) hours as needed for nausea or vomiting. 09/21/20   Tysinger, Camelia Eng, PA-C  QUEtiapine (SEROQUEL) 25 MG tablet Take 1 tablet (25 mg total) by mouth at bedtime. 07/10/21 10/08/21  [place] Donnamarie Poag, DO  sertraline (ZOLOFT) 25 MG tablet Take 1 tablet (25 mg total) by mouth daily. 07/11/21 10/09/21  [place] Donnamarie Poag, DO  sildenafil (VIAGRA) 50 MG tablet Take 1 tablet (50 mg total) by mouth as needed for erectile dysfunction. 12/28/20 12/28/21  Tysinger, Camelia Eng, PA-C  tamsulosin (FLOMAX) 0.4 MG CAPS capsule Take 1 capsule (0.4 mg total) by mouth daily. 07/10/21 10/08/21  [place] Eric J, DO  UNABLE TO FIND Med Name:  Custom diabetic shoes  Dx: E11.42 05/14/20   Shamleffer, Melanie Crazier, MD     Allergies    Morphine  Review of Systems   Review of Systems  Unable to perform ROS: Mental status change   Physical Exam Updated Vital Signs BP (!) 233/146   Pulse (!) 116   Temp 98.7 F (37.1 C) (Oral)   Resp 17   SpO2 100%   Physical Exam Vitals and nursing note reviewed.  Constitutional:      Appearance: He is well-developed.     Comments: Morbidly obese, ABCs intact  HENT:     Head: Normocephalic and atraumatic.     Mouth/Throat:     Mouth: Mucous membranes are moist.  Eyes:     Pupils: Pupils are equal, round, and reactive to light.  Cardiovascular:     Rate and Rhythm: Normal rate and regular rhythm.  Pulmonary:     Effort: Pulmonary effort is normal. No respiratory distress.  Abdominal:     Palpations: Abdomen is soft.     Tenderness: There is no abdominal tenderness.  Genitourinary:    Comments: Incontinent of stool Musculoskeletal:     Cervical back: Neck supple.     Right lower leg: No edema.     Left lower leg: No edema.  Lymphadenopathy:     Cervical: No cervical adenopathy.  Skin:    General: Skin is warm and dry.  Neurological:     Mental Status: He is alert.     Comments: Disoriented, not directable, moves all 4 extremities equally, speaking in random sentences and phrases  Psychiatric:     Comments: Uncooperative    ED Results / Procedures / Treatments   Labs (all labs ordered are listed, but only abnormal results are displayed) Labs Reviewed  CBC WITH DIFFERENTIAL/PLATELET - Abnormal; Notable for the following components:      Result Value   HCT 38.2 (*)    RDW 11.4 (*)    All other components within normal limits  COMPREHENSIVE METABOLIC PANEL - Abnormal; Notable for the following components:   Potassium 2.8 (*)    Glucose, Bld 456 (*)    Creatinine, Ser 2.88 (*)    Alkaline Phosphatase 200 (*)    Total Bilirubin 1.4 (*)    GFR, Estimated 25 (*)  All other components within normal limits  CBG MONITORING, ED - Abnormal;  Notable for the following components:   Glucose-Capillary 469 (*)    All other components within normal limits  AMMONIA  URINALYSIS, ROUTINE W REFLEX MICROSCOPIC  RAPID URINE DRUG SCREEN, HOSP PERFORMED  ETHANOL    EKG None  Radiology No results found.  Procedures .Critical Care Performed by: Merryl Hacker, MD Authorized by: Merryl Hacker, MD   Critical care provider statement:    Critical care time (minutes):  70   Critical care time was exclusive of:  Separately billable procedures and treating other patients   Critical care was necessary to treat or prevent imminent or life-threatening deterioration of the following conditions:  Metabolic crisis (Hypertensive emergency)   Critical care was time spent personally by me on the following activities:  Blood draw for specimens, ordering and performing treatments and interventions, ordering and review of radiographic studies, re-evaluation of patient's condition, review of old charts, development of treatment plan with patient or surrogate, examination of patient and obtaining history from patient or surrogate   Medications Ordered in ED Medications  diphenhydrAMINE (BENADRYL) injection 25 mg (has no administration in time range)  LORazepam (ATIVAN) injection 1 mg (has no administration in time range)  sodium chloride 0.9 % bolus 1,000 mL (1,000 mLs Intravenous New Bag/Given 09/14/21 0610)  sterile water (preservative free) injection (  Given 09/14/21 0522)  ziprasidone (GEODON) injection 20 mg (20 mg Intramuscular Given 09/14/21 0504)  hydrALAZINE (APRESOLINE) injection 10 mg (10 mg Intravenous Given 09/14/21 7829)    ED Course  I have reviewed the triage vital signs and the nursing notes.  Pertinent labs & imaging results that were available during my care of the patient were reviewed by me and considered in my medical decision making (see chart for details).  Clinical Course as of 09/14/21 0635  Tue Sep 14, 2021   0538 Patient severely agitated.  Not following commands.  Required Geodon and subsequently Ativan and Benadryl just in order to keep him in bed and obtain IV access.  History of similar symptoms.  Was admitted to the ICU and required Precedex for encephalopathy. [CH]  0542 Patient now somewhat less agitated.  He now has IV access.  Repeat blood pressure significantly elevated.  Concern for press.  He will need CT scanning.  Patient was given dose of IV hydralazine.  Previously has required nicardipine drip for blood pressure control and management. [CH]    Clinical Course User Index [CH] Kael Keetch, Barbette Hair, MD   MDM Rules/Calculators/A&P                           This a 60 year old male who presents acutely altered.  He is notably hypertensive and hyperglycemic.  He is completely altered on my initial evaluation and appears encephalopathic.  He does move all 4 extremities and no obvious focal deficits.  He required significant chemical restraint with Geodon, Ativan, and Benadryl before work-up could really be initiated.  He also had stooled all over himself.  Wife reports similar presentations in the past related to hyperglycemia and hypertension.  She also reports mental health history and history of autism.  She does not believe he has been taking his meds.  I would be concerned for condition such as PRESS or HHS.  See clinical course above.  Patient will need CT of the head to rule out bleed or focal lesion.  Other lab work  is pending.  No indication of HHS at this time.  He was given fluids but will hold off on insulin.  Anticipate admission.  Final Clinical Impression(s) / ED Diagnoses Final diagnoses:  Hypertensive emergency  Encephalopathy acute  Hyperglycemia    Rx / DC Orders ED Discharge Orders     None        Scarlet Abad, Barbette Hair, MD 09/14/21 [phone]

## 2021-09-15 ENCOUNTER — Inpatient Hospital Stay (HOSPITAL_COMMUNITY): Payer: Medicare Other

## 2021-09-15 DIAGNOSIS — N170 Acute kidney failure with tubular necrosis: Secondary | ICD-10-CM

## 2021-09-15 DIAGNOSIS — N1832 Chronic kidney disease, stage 3b: Secondary | ICD-10-CM

## 2021-09-15 DIAGNOSIS — R252 Cramp and spasm: Secondary | ICD-10-CM | POA: Diagnosis not present

## 2021-09-15 DIAGNOSIS — R739 Hyperglycemia, unspecified: Secondary | ICD-10-CM | POA: Diagnosis not present

## 2021-09-15 DIAGNOSIS — I161 Hypertensive emergency: Principal | ICD-10-CM

## 2021-09-15 LAB — URINALYSIS, ROUTINE W REFLEX MICROSCOPIC
Bacteria, UA: NONE SEEN
Bilirubin Urine: NEGATIVE
Glucose, UA: >=500 mg/dL — AB
Ketones, ur: NEGATIVE mg/dL
Leukocytes,Ua: NEGATIVE
Nitrite: NEGATIVE
Protein, ur: >=300 mg/dL — AB
Specific Gravity, Urine: 1.016 (ref 1.005–1.030)
pH: 5 (ref 5.0–8.0)

## 2021-09-15 LAB — BASIC METABOLIC PANEL
Anion gap: 10 (ref 5–15)
BUN: 22 mg/dL — ABNORMAL HIGH (ref 6–20)
CO2: 20 mmol/L — ABNORMAL LOW (ref 22–32)
Calcium: 8.8 mg/dL — ABNORMAL LOW (ref 8.9–10.3)
Chloride: 109 mmol/L (ref 98–111)
Creatinine, Ser: 3.85 mg/dL — ABNORMAL HIGH (ref 0.61–1.24)
GFR, Estimated: 17 mL/min — ABNORMAL LOW (ref >60)
Glucose, Bld: 109 mg/dL — ABNORMAL HIGH (ref 70–99)
Potassium: 3.3 mmol/L — ABNORMAL LOW (ref 3.5–5.1)
Sodium: 139 mmol/L (ref 135–145)

## 2021-09-15 LAB — CBC
HCT: 34.5 % — ABNORMAL LOW (ref 39.0–52.0)
Hemoglobin: 12.3 g/dL — ABNORMAL LOW (ref 13.0–17.0)
MCH: 29.5 pg (ref 26.0–34.0)
MCHC: 35.7 g/dL (ref 30.0–36.0)
MCV: 82.7 fL (ref 80.0–100.0)
Platelets: 234 10*3/uL (ref 150–400)
RBC: 4.17 MIL/uL — ABNORMAL LOW (ref 4.22–5.81)
RDW: 11.6 % (ref 11.5–15.5)
WBC: 10 10*3/uL (ref 4.0–10.5)
nRBC: 0 % (ref 0.0–0.2)

## 2021-09-15 LAB — RAPID URINE DRUG SCREEN, HOSP PERFORMED
Amphetamines: NOT DETECTED
Barbiturates: NOT DETECTED
Benzodiazepines: NOT DETECTED
Cocaine: NOT DETECTED
Opiates: NOT DETECTED
Tetrahydrocannabinol: NOT DETECTED

## 2021-09-15 LAB — MAGNESIUM: Magnesium: 1.7 mg/dL (ref 1.7–2.4)

## 2021-09-15 LAB — COMPREHENSIVE METABOLIC PANEL
ALT: 12 U/L (ref 0–44)
AST: 20 U/L (ref 15–41)
Albumin: 2.9 g/dL — ABNORMAL LOW (ref 3.5–5.0)
Alkaline Phosphatase: 140 U/L — ABNORMAL HIGH (ref 38–126)
Anion gap: 10 (ref 5–15)
BUN: 21 mg/dL — ABNORMAL HIGH (ref 6–20)
CO2: 21 mmol/L — ABNORMAL LOW (ref 22–32)
Calcium: 8.7 mg/dL — ABNORMAL LOW (ref 8.9–10.3)
Chloride: 110 mmol/L (ref 98–111)
Creatinine, Ser: 3.55 mg/dL — ABNORMAL HIGH (ref 0.61–1.24)
GFR, Estimated: 19 mL/min — ABNORMAL LOW (ref >60)
Glucose, Bld: 141 mg/dL — ABNORMAL HIGH (ref 70–99)
Potassium: 3.2 mmol/L — ABNORMAL LOW (ref 3.5–5.1)
Sodium: 141 mmol/L (ref 135–145)
Total Bilirubin: 1.4 mg/dL — ABNORMAL HIGH (ref 0.3–1.2)
Total Protein: 6.3 g/dL — ABNORMAL LOW (ref 6.5–8.1)

## 2021-09-15 LAB — GLUCOSE, CAPILLARY
Glucose-Capillary: 128 mg/dL — ABNORMAL HIGH (ref 70–99)
Glucose-Capillary: 177 mg/dL — ABNORMAL HIGH (ref 70–99)
Glucose-Capillary: 165 mg/dL — ABNORMAL HIGH (ref 70–99)
Glucose-Capillary: 116 mg/dL — ABNORMAL HIGH (ref 70–99)
Glucose-Capillary: 176 mg/dL — ABNORMAL HIGH (ref 70–99)
Glucose-Capillary: 125 mg/dL — ABNORMAL HIGH (ref 70–99)

## 2021-09-15 MED ORDER — HYDRALAZINE HCL 50 MG PO TABS
25.0000 mg | ORAL_TABLET | Freq: Three times a day (TID) | ORAL | Status: DC
  Administered 2021-09-15 – 2021-09-20 (×13): 25 mg via ORAL
  Filled 2021-09-15 (×14): qty 1

## 2021-09-15 MED ORDER — POTASSIUM CHLORIDE CRYS ER 20 MEQ PO TBCR
40.0000 meq | EXTENDED_RELEASE_TABLET | Freq: Once | ORAL | Status: AC
  Administered 2021-09-15: 40 meq via ORAL
  Filled 2021-09-15: qty 2

## 2021-09-15 MED ORDER — LACTATED RINGERS IV SOLN: INTRAVENOUS | Status: DC

## 2021-09-15 MED ORDER — MAGNESIUM SULFATE IN D5W 1-5 GM/100ML-% IV SOLN
1.0000 g | Freq: Once | INTRAVENOUS | Status: AC
  Administered 2021-09-15: 1 g via INTRAVENOUS
  Filled 2021-09-15: qty 100

## 2021-09-15 MED ORDER — PANTOPRAZOLE 2 MG/ML SUSPENSION
40.0000 mg | Freq: Every day | ORAL | Status: DC
  Filled 2021-09-15: qty 20

## 2021-09-15 MED ORDER — METOPROLOL TARTRATE 5 MG/5ML IV SOLN
5.0000 mg | INTRAVENOUS | Status: DC | PRN
  Administered 2021-09-18: 5 mg via INTRAVENOUS
  Filled 2021-09-15 (×2): qty 5

## 2021-09-15 MED ORDER — POTASSIUM CHLORIDE 20 MEQ PO PACK
40.0000 meq | PACK | Freq: Once | ORAL | Status: AC
  Administered 2021-09-15: 40 meq
  Filled 2021-09-15: qty 2

## 2021-09-15 MED ORDER — AMLODIPINE BESYLATE 10 MG PO TABS
10.0000 mg | ORAL_TABLET | Freq: Every day | ORAL | Status: DC
  Administered 2021-09-16 – 2021-09-21 (×6): 10 mg via ORAL
  Filled 2021-09-15 (×6): qty 1

## 2021-09-15 MED ORDER — PANTOPRAZOLE SODIUM 40 MG PO TBEC
40.0000 mg | DELAYED_RELEASE_TABLET | Freq: Every day | ORAL | Status: DC
  Administered 2021-09-15 – 2021-09-20 (×6): 40 mg via ORAL
  Filled 2021-09-15 (×7): qty 1

## 2021-09-15 MED ORDER — MAGNESIUM OXIDE -MG SUPPLEMENT 400 (240 MG) MG PO TABS
400.0000 mg | ORAL_TABLET | Freq: Once | ORAL | Status: AC
  Administered 2021-09-15: 400 mg via NASOGASTRIC
  Filled 2021-09-15: qty 1

## 2021-09-15 MED ORDER — SERTRALINE HCL 50 MG PO TABS
25.0000 mg | ORAL_TABLET | Freq: Every day | ORAL | Status: DC
  Administered 2021-09-16 – 2021-09-21 (×6): 25 mg via ORAL
  Filled 2021-09-15 (×6): qty 1

## 2021-09-15 MED ORDER — LEVETIRACETAM 500 MG PO TABS
1000.0000 mg | ORAL_TABLET | Freq: Two times a day (BID) | ORAL | Status: DC
  Administered 2021-09-15: 1000 mg via ORAL
  Filled 2021-09-15 (×2): qty 2

## 2021-09-15 MED ORDER — QUETIAPINE FUMARATE 50 MG PO TABS
25.0000 mg | ORAL_TABLET | Freq: Every day | ORAL | Status: DC
  Administered 2021-09-15 – 2021-09-20 (×6): 25 mg via ORAL
  Filled 2021-09-15 (×6): qty 1

## 2021-09-15 MED ORDER — ATORVASTATIN CALCIUM 40 MG PO TABS
40.0000 mg | ORAL_TABLET | Freq: Every day | ORAL | Status: DC
  Administered 2021-09-15 – 2021-09-20 (×6): 40 mg via ORAL
  Filled 2021-09-15 (×6): qty 1

## 2021-09-15 MED ORDER — ASPIRIN EC 81 MG PO TBEC
81.0000 mg | DELAYED_RELEASE_TABLET | Freq: Every day | ORAL | Status: DC
  Administered 2021-09-15 – 2021-09-21 (×7): 81 mg via ORAL
  Filled 2021-09-15 (×8): qty 1

## 2021-09-15 NOTE — Progress Notes (Signed)
[place] Progress Note Patient Name: Brandon [name] DOB: 1961-04-26 MRN: [id number]   Date of Service  09/15/2021  HPI/Events of Note  Mag and K low  eICU Interventions  Replaced both down OGT     Intervention Category Minor Interventions: Electrolytes abnormality - evaluation and management  Tilden Dome 09/15/2021, 6:05 AM

## 2021-09-15 NOTE — Progress Notes (Addendum)
NAME:  Brandon [name], MRN:  [id number], DOB:  [birth year], LOS: 1 ADMISSION DATE:  09/14/2021, CONSULTATION DATE:  09/15/11 REFERRING MD:  Kommer - EM, CHIEF COMPLAINT:  hypertensive emergency, encephalopathy    History of Present Illness:  60 year old male with a past medical history of remote CVA in 2013 with residual left-sided deficits, prior seizures, schizophrenia, uncontrolled type 2 diabetes and medication noncompliance presenting with altered mental status, hyperglycemia and hypertensive emergency.  History obtained per chart review and patient's wife due to his altered mental status.  His wife called EMS because of a high blood glucose at 3 AM this morning.  She gave him NovoLog 18 units prior to EMS arrival.  She reports he was confused and disoriented, acting abnormal at home.  Compliance with his medications has been an ongoing issue.  Wife reports that he is presented similarly in the setting of high blood pressure and high blood sugars in the past.  He was recently hospitalized with hypertensive encephalopathy in July 2022.    On EMS arrival his blood pressure was 220/120 and blood sugar was 469 on arrival to ED.  He was severely agitated in the ED requiring Geodon and subsequently Ativan and Benadryl to keep him in bed and obtain IV access.  Wife states that he started acting abnormal at home and suspects that he has not been taking his medications.  This has been an ongoing issue with multiple hospitalizations.  Pertinent  Medical History  Hypertension Type 2 diabetes Schizophrenia CKD stage III Hyperlipidemia  Significant Hospital Events: Including procedures, antibiotic start and stop dates in addition to other pertinent events   10/11 admitted to ICU, cardene gtt   Interim History / Subjective:  SBP overnight largely 120-140 from 200 on cardene gtt   Objective   Blood pressure 139/85, pulse 96, temperature 97.8 F (36.6 C), temperature source Oral, resp. rate 15, SpO2  99 %.        Intake/Output Summary (Last 24 hours) at 09/15/2021 0932 Last data filed at 09/15/2021 0600 Gross per 24 hour  Intake 1710.62 ml  Output 450 ml  Net 1260.62 ml   There were no vitals filed for this visit.  Examination: General: Chronically ill appearing adult M, appears older than stated age  16: NCAT NGT secure. Anicteric sclera, pink tacky mm with dry crusty lips  Lungs: CTAb symmetrical chest expansion even unlabored on RA  Cardiovascular: rrr s1s2 no rgm  Abdomen: Soft, non-distended. Normoactive x4  Extremities: No acute deformity, no cyanosis or clubbing  Neuro: Awake Alert Oriented x3 Following commands. PERRL 59mm  GU: WNL no foley  Psych: fluctuating agitation, cooperation   Resolved Hospital Problem list     Assessment & Plan:   Acute encephalopathy -- improving -CT H without acute abnormality (Hx r MCA infarct) Schizophrenia  Hx Seizures Hx R MCA CVA  P -Keppra  P -delirium precautions -BP control, AKI as below -seroquel, zoloft, PRN haldol   Hypertensive Emergency  -UDS neg -this morning pt shares he has a hard time remembering his BP medication P -dc cardene gtt  -SBP goal about 160 (140-160) -norvasc, hydral  -if able to stay off of cardene gtt, likely able to transfer out of the ICU later today (10/12)  Poorly controlled DM2 with severe hyperglycemia  P -rSSI  AKI on CKD IIIb -baseline Cr around 2 P -trend renal indices UOP  Hypokalemia Hypomagnesemia P -replace as needed   Elevated LFTs P -trend PRN  HLD -Statin  RLE cramping -difficult to discern chronicity -does endorse recent travel via car P -check lower extremity doppler   Possible dysphagia -pt references choking sensation when he eats P -SLP swallow eval -keep NGT for now, if passes a diet will dc   Goals of Care -chart review reveals several prior admissions for hypertensive urgency, acute encephalopathy, hyperglycemia / HHS -prior to dispo,  need to help figure out how to help pt follow up output-- looks like pt has cancelled numerous PCP appts   Best Practice (right click and "Reselect all SmartList Selections" daily)   Diet/type: NPO DVT prophylaxis: prophylactic heparin  GI prophylaxis: N/A Lines: N/A Foley:  N/A Code Status:  full code Last date of multidisciplinary goals of care discussion [pending]  Labs   CBC: Recent Labs  Lab 09/14/21 0449 09/15/21 0417  WBC 9.7 10.0  NEUTROABS 6.9  --   HGB 13.4 12.3*  HCT 38.2* 34.5*  MCV 83.6 82.7  PLT 240 366    Basic Metabolic Panel: Recent Labs  Lab 09/14/21 0449 09/14/21 2205 09/15/21 0417  NA 136 140 141  K 2.8* 3.1* 3.2*  CL 99 108 110  CO2 26 24 21*  GLUCOSE 456* 110* 141*  BUN 19 20 21*  CREATININE 2.88* 3.19* 3.55*  CALCIUM 9.3 8.5* 8.7*  MG  --  1.8 1.7   GFR: CrCl cannot be calculated (Unknown ideal weight.). Recent Labs  Lab 09/14/21 0449 09/15/21 0417  WBC 9.7 10.0    Liver Function Tests: Recent Labs  Lab 09/14/21 0449 09/15/21 0417  AST 19 20  ALT 15 12  ALKPHOS 200* 140*  BILITOT 1.4* 1.4*  PROT 7.8 6.3*  ALBUMIN 3.8 2.9*   No results for input(s): LIPASE, AMYLASE in the last 168 hours. Recent Labs  Lab 09/14/21 0548  AMMONIA 25    ABG    Component Value Date/Time   HCO3 24.3 07/04/2021 1954   TCO2 25 07/04/2021 1954   O2SAT 96.0 07/04/2021 1954     Coagulation Profile: No results for input(s): INR, PROTIME in the last 168 hours.  Cardiac Enzymes: No results for input(s): CKTOTAL, CKMB, CKMBINDEX, TROPONINI in the last 168 hours.  HbA1C: Hgb A1c MFr Bld  Date/Time Value Ref Range Status  09/14/2021 09:52 AM 8.4 (H) 4.8 - 5.6 % Final    Comment:    (NOTE) Pre diabetes:          5.7%-6.4%  Diabetes:              >6.4%  Glycemic control for   <7.0% adults with diabetes   07/09/2021 12:41 AM 9.8 (H) 4.8 - 5.6 % Final    Comment:    (NOTE) Pre diabetes:          5.7%-6.4%  Diabetes:               >6.4%  Glycemic control for   <7.0% adults with diabetes     CBG: Recent Labs  Lab 09/14/21 1527 09/14/21 1916 09/14/21 2324 09/15/21 0316 09/15/21 0759  GLUCAP 162* 103* 112* 128* 177*      Critical care time: 40 min     CRITICAL CARE Performed by: Cristal Generous   Total critical care time: 40 minutes  Critical care time was exclusive of separately billable procedures and treating other patients. Critical care was necessary to treat or prevent imminent or life-threatening deterioration.  Critical care was time spent personally by me on the following activities: development of treatment plan with  patient and/or surrogate as well as nursing, discussions with consultants, evaluation of patient's response to treatment, examination of patient, obtaining history from patient or surrogate, ordering and performing treatments and interventions, ordering and review of laboratory studies, ordering and review of radiographic studies, pulse oximetry and re-evaluation of patient's condition.  Eliseo Gum MSN, AGACNP-BC [place] for pager  09/15/2021, 8:21 AM

## 2021-09-15 NOTE — Progress Notes (Signed)
Modified Barium Swallow Progress Note  Patient Details  Name: Brandon [name] MRN: [id number] Date of Birth: 08-Jun-1961  Today's Date: 09/15/2021  Modified Barium Swallow completed.  Full report located under Chart Review in the Imaging Section.  Brief recommendations include the following:  Clinical Impression  Pt presents with mild pharyngeal dysphagia characterized by reduced lingual retraction, reduced hyolaryngeal elevation and reduced anterior laryngeal movement. He demonstrated trace vallecular residue and inconsistent penetration (PAS 3) of thin liquids. However, bolus sizes were large and penetration was eliminated with cued smaller sips. vallecular residue was reduced with a liquid wash. A dysphagia 3 diet with thin liquids is recommended at this time. SLP will follow briefly to reinforce swallowing precautions and to ensure tolerance.   Swallow Evaluation Recommendations       SLP Diet Recommendations: Dysphagia 3 (Mech soft) solids;Thin liquid   Liquid Administration via: Straw;Cup   Medication Administration: Whole meds with liquid   Supervision: Patient able to self feed   Compensations: Small sips/bites;Slow rate   Postural Changes: Seated upright at 90 degrees          Dezaree Tracey I. Hardin Negus, [place], [place] Office number [phone] Pager [place] 09/15/2021,3:05 PM

## 2021-09-15 NOTE — Evaluation (Signed)
Clinical/Bedside Swallow Evaluation Patient Details  Name: Brandon [name] MRN: [id number] Date of Birth: 1961-04-09  Today's Date: 09/15/2021 Time: SLP Start Time (ACUTE ONLY): 2952 SLP Stop Time (ACUTE ONLY): 0930 SLP Time Calculation (min) (ACUTE ONLY): 14 min  Past Medical History:  Past Medical History:  Diagnosis Date   Acute ischemic stroke ([place]) 11/2013   Allergy    Anxiety    Arthritis    Benign hypertension with CKD (chronic kidney disease) stage III ([place])    Bil Renal Ca dx'd 09/2011 & 11/2011   left and right; cryoablation bil   CVA (cerebral vascular accident) ([place])    stroke   Depression    BH Adm in [place] Depression   Diabetes mellitus    DKA prior hospitalization   Diverticulitis    s/p micorperforation Sept 2012-managed conservatively by Gen surgery   ED (erectile dysfunction)    Focal seizure ([place]) 11/2013   due to ischemic stroke   GERD (gastroesophageal reflux disease)    Hiatal hernia    Hyperlipidemia    Hypertension    Kidney tumor 09/2011   Renal cell CA   Seizures ([place])    none since 2016, taking Keppra - maw   Sleep apnea    Thyroid disease    "weak thyroid" per MD   Wears glasses    Past Surgical History:  Past Surgical History:  Procedure Laterality Date   COLONOSCOPY     IR RADIOLOGIST EVAL & MGMT  04/04/2017   KIDNEY SURGERY     ablation of renal cell CA - 12/28, prior one was October [place]   TEE WITHOUT CARDIOVERSION N/A 11/19/2013   Procedure: TRANSESOPHAGEAL ECHOCARDIOGRAM (TEE);  Surgeon: Lelon Perla, MD;  Location: [facility] ENDOSCOPY;  Service: Cardiovascular;  Laterality: N/A;   HPI:  Pt is a 60 year old male who presented with altered mental status, hyperglycemia and hypertensive emergency. Pt reported choking sensation when he eats. NGT was placed with plan for swallow eval. 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 Pt was referred for an MBS in October, 2021, but it was not completed.    Assessment / Plan / Recommendation  Clinical Impression  Pt was seen for bedside swallow evaluation and he reported that he inconsistently "gets choked" on solids and liquids. Pt stated that he has been symptomatic since he has been symptomatic since he broke his neck in 2016. Oral mechanism exam was [facility]. He presented with adequate mandibular teeth. He stated that he typical wears dentures, but has misplaced them. He tolerated all solids and liquids without overt signs or symptoms of oropharyngeal dysphagia, but trials of solids were limited per pt's request. Considering pt's reports and history, a modified barium swallow study will be conducted to further assess physiology; it is scheduled for today at 1330. Pt's potential for diet initiation is good. SLP requests removal of large bore NGT prior to study to rule out its potential impact on pharyngeal function. Dr. Ruthann [name] has been contacted regarding this and has agreed to removal if pt is amenable to its replacement with Cortrak if needed.  SLP Visit Diagnosis: Dysphagia, unspecified (R13.10)    Aspiration Risk  Mild aspiration risk    Diet Recommendation  (Defer until completion of MBS)   Liquid Administration via: Cup;Straw Supervision: Patient able to self feed Postural Changes: Seated upright at 90 degrees    Other  Recommendations Oral Care Recommendations: Oral care BID  Recommendations for follow up therapy are one component of a multi-disciplinary discharge planning process, led by the attending physician.  Recommendations may be updated based on patient status, additional functional criteria and insurance authorization.  Follow up Recommendations  (TBD)      Frequency and Duration min 2x/week  2 weeks       Prognosis Prognosis for Safe Diet Advancement: Good      Swallow Study   General Date of Onset: 09/14/21 HPI: Pt is a  60 year old male who presented with altered mental status, hyperglycemia and hypertensive emergency. Pt reported choking sensation when he eats. NGT was placed with plan for swallow eval. 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 Pt was referred for an MBS in October, 2021, but it was not completed. Type of Study: Bedside Swallow Evaluation Diet Prior to this Study: NG Tube Temperature Spikes Noted: No Respiratory Status: Room air History of Recent Intubation: No Behavior/Cognition: Alert;Cooperative;Pleasant mood Oral Cavity Assessment: Within Functional Limits Oral Care Completed by SLP: No Oral Cavity - Dentition: Adequate natural dentition Vision: Functional for self-feeding Self-Feeding Abilities: Able to feed self Patient Positioning: Upright in bed;Postural control adequate for testing Baseline Vocal Quality: Normal Volitional Cough: Strong Volitional Swallow: Able to elicit    Oral/Motor/Sensory Function Overall Oral Motor/Sensory Function: Within functional limits   Ice Chips Ice chips: Within functional limits Presentation: Spoon   Thin Liquid Thin Liquid: Within functional limits Presentation: Straw    Nectar Thick Nectar Thick Liquid: Not tested   Honey Thick Honey Thick Liquid: Not tested   Puree Puree: Within functional limits Presentation: Spoon   Solid     Solid: Within functional limits Presentation: Brandon [name] Negus, [place], [place] Office number [phone] Pager [place] 09/15/2021,9:35 AM

## 2021-09-15 NOTE — Progress Notes (Signed)
CCM Brief Progress Note   60 yo M admitted with encephalopathy in setting of hypertensive emergency, HHS, AKI on CKD3b. Encephalopathy, BP, and Glucose have improved today. Renal function slightly worse this morning -- Cr 3.5. Stable for transfer out of ICU   Over the course of the day, has not made urine. Has been bladder scanned.   AKI on CKD IIIb -in setting of hypertensive emergency, ?diabetic complications  Plan -recheck BMP -incr mIVF -trend UOP, renal function  -will order a renal ultrasound    Eliseo Gum MSN, AGACNP-BC [place] 09/15/2021, 3:17 PM

## 2021-09-15 NOTE — Progress Notes (Signed)
RLE venous duplex has been completed.  Results can be found under chart review under CV PROC. 09/15/2021 6:01 PM Marian Meneely RVT, RDMS

## 2021-09-16 ENCOUNTER — Inpatient Hospital Stay (HOSPITAL_COMMUNITY): Payer: Medicare Other

## 2021-09-16 LAB — GLUCOSE, CAPILLARY
Glucose-Capillary: 61 mg/dL — ABNORMAL LOW (ref 70–99)
Glucose-Capillary: 109 mg/dL — ABNORMAL HIGH (ref 70–99)
Glucose-Capillary: 108 mg/dL — ABNORMAL HIGH (ref 70–99)
Glucose-Capillary: 121 mg/dL — ABNORMAL HIGH (ref 70–99)
Glucose-Capillary: 104 mg/dL — ABNORMAL HIGH (ref 70–99)
Glucose-Capillary: 129 mg/dL — ABNORMAL HIGH (ref 70–99)
Glucose-Capillary: 169 mg/dL — ABNORMAL HIGH (ref 70–99)
Glucose-Capillary: 98 mg/dL (ref 70–99)

## 2021-09-16 LAB — BASIC METABOLIC PANEL
Anion gap: 9 (ref 5–15)
BUN: 24 mg/dL — ABNORMAL HIGH (ref 6–20)
CO2: 22 mmol/L (ref 22–32)
Calcium: 8.8 mg/dL — ABNORMAL LOW (ref 8.9–10.3)
Chloride: 107 mmol/L (ref 98–111)
Creatinine, Ser: 4.05 mg/dL — ABNORMAL HIGH (ref 0.61–1.24)
GFR, Estimated: 16 mL/min — ABNORMAL LOW (ref >60)
Glucose, Bld: 115 mg/dL — ABNORMAL HIGH (ref 70–99)
Potassium: 3.2 mmol/L — ABNORMAL LOW (ref 3.5–5.1)
Sodium: 138 mmol/L (ref 135–145)

## 2021-09-16 LAB — MAGNESIUM: Magnesium: 2.2 mg/dL (ref 1.7–2.4)

## 2021-09-16 MED ORDER — DEXTROSE 50 % IV SOLN
INTRAVENOUS | Status: AC
  Administered 2021-09-16: 25 mL
  Filled 2021-09-16: qty 50

## 2021-09-16 MED ORDER — INSULIN GLARGINE-YFGN 100 UNIT/ML ~~LOC~~ SOLN
20.0000 [IU] | Freq: Every day | SUBCUTANEOUS | Status: DC
  Filled 2021-09-16 (×2): qty 0.2

## 2021-09-16 MED ORDER — PANTOPRAZOLE SODIUM 40 MG PO TBEC: 40.0000 mg | DELAYED_RELEASE_TABLET | Freq: Every day | ORAL | Status: DC

## 2021-09-16 MED ORDER — QUETIAPINE FUMARATE 25 MG PO TABS: 25.0000 mg | ORAL_TABLET | Freq: Every day | ORAL | Status: DC

## 2021-09-16 MED ORDER — POTASSIUM CHLORIDE CRYS ER 20 MEQ PO TBCR
40.0000 meq | EXTENDED_RELEASE_TABLET | Freq: Once | ORAL | Status: AC
  Administered 2021-09-16: 40 meq via ORAL
  Filled 2021-09-16: qty 2

## 2021-09-16 MED ORDER — SERTRALINE HCL 50 MG PO TABS: 25.0000 mg | ORAL_TABLET | Freq: Every day | ORAL | Status: DC

## 2021-09-16 MED ORDER — EZETIMIBE 10 MG PO TABS
10.0000 mg | ORAL_TABLET | Freq: Every day | ORAL | Status: DC
  Administered 2021-09-16 – 2021-09-21 (×6): 10 mg via ORAL
  Filled 2021-09-16 (×6): qty 1

## 2021-09-16 MED ORDER — ASPIRIN EC 81 MG PO TBEC: 81.0000 mg | DELAYED_RELEASE_TABLET | Freq: Every day | ORAL | Status: DC

## 2021-09-16 MED ORDER — ATORVASTATIN CALCIUM 40 MG PO TABS: 40.0000 mg | ORAL_TABLET | Freq: Every day | ORAL | Status: DC

## 2021-09-16 MED ORDER — LEVETIRACETAM 500 MG PO TABS: 1000.0000 mg | ORAL_TABLET | Freq: Every day | ORAL | Status: DC

## 2021-09-16 MED ORDER — LEVETIRACETAM 500 MG PO TABS
500.0000 mg | ORAL_TABLET | Freq: Two times a day (BID) | ORAL | Status: DC
  Administered 2021-09-16 – 2021-09-21 (×11): 500 mg via ORAL
  Filled 2021-09-16 (×11): qty 1

## 2021-09-16 NOTE — Progress Notes (Signed)
[facility] Progress Note Patient Name: Brandon [name] DOB: August 30, 1961 MRN: [id number]   Date of Service  09/16/2021  HPI/Events of Note  Patient woke up very agitated and confused, trying to hit staff. PRN haldol given but can get order for restrains bilateral wrist and ankle.  Seen on camera not actively agitated at this time. 4 point restraint on  eICU Interventions  Ordered bilateral soft wrist and ankle restraint Bedside MD to reassess in the morning if these restraints may be removed     Intervention Category Minor Interventions: Agitation / anxiety - evaluation and management  Judd Lien 09/16/2021, 4:10 AM

## 2021-09-16 NOTE — Progress Notes (Signed)
SLP Cancellation Note  Patient Details Name: Brandon [name] MRN: [id number] DOB: [birth year]   Cancelled treatment:       Reason Eval/Treat Not Completed: Fatigue/lethargy limiting ability to participate (Case discussed with Judson Roch, RN who reported that the pt has been lethargic all day and disinterested in food. SLP will follow up on subsequent date.)  Darrien Laakso I. Hardin Negus, [place], [place] Office number [phone] Pager [phone]   Horton Marshall 09/16/2021, 5:39 PM

## 2021-09-16 NOTE — Progress Notes (Addendum)
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  History obtained per chart review and patient's wife due to his altered mental status.  His wife called EMS because of a high blood glucose at 3 AM this morning.  She gave him NovoLog 18 units prior to EMS arrival.  She reports he was confused and disoriented, acting abnormal at home.  Compliance with his medications has been an ongoing issue.  Wife reports that he is presented similarly in the setting of high blood pressure and high blood sugars in the past.  He was recently hospitalized with hypertensive encephalopathy in July 2022.    On EMS arrival his blood pressure was 220/120 and blood sugar was 469 on arrival to ED.  He was severely agitated in the ED requiring Geodon and subsequently Ativan and Benadryl to keep him in bed and obtain IV access.  Wife states that he started acting abnormal at home and suspects that he has not been taking his medications.  This has been an ongoing issue with multiple hospitalizations.    Assessment & Plan:   Active Problems:   Hypertensive emergency  Hypertensive emergency: UDS negative.  Patient was admitted by ICU team and started on Cardene drip initially.  Now blood pressure controlled on Norvasc and hydralazine.  Monitor.  Acute encephalopathy: Likely secondary to hypertensive emergency.  CT head negative for any acute pathology.  Delirium: Reportedly, patient was belligerent when he was woken up in the middle of the night.  Likely had delirium.  This morning he was sleepy but easily arousable and very reasonable with his conversations.  Delirium has  resolved.  We will continue Seroquel nightly and continue delirium precautions.  Poorly controlled type 2 diabetes mellitus with hyperglycemia: Hemoglobin A1c 8.4 this admission.  Now blood sugar very well controlled on Semglee 20 units and SSI.  AKI on CKD stage IIIb vs ckd stage 4: From chart review, it appears that patient has been having varied levels of creatinine ranging anywhere from 1.9-2.7, fluctuating between CKD stage IIIb and CKD stage IV.  He came in with worsening renal function.  Which has further worsened from 3.8 yesterday to 4.05 today.  No urine output charted.  Bladder scan ordered every 4 hours but none done.  Primary RN advised to check bladder scan.  If significant amount of retention, will need Foley catheter.  Ultrasound renal shows normal parenchymal thickness with no acute obstruction.  Continue IV fluids.  History of seizure disorder: Reduce Keppra back to his home dose which is 500 mg twice daily.  Hypokalemia: 3.2.  Will replace.  Hypomagnesemia: Resolved.  We will recheck.  Elevated LFTs: Resolved.  Hyperlipidemia: Continue statin.  Dysphagia: S/p MBS and SLP following, currently on dysphagia 3 diet.  Noncompliance: It appears that patient has had several admissions due to hypertensive urgency and acute encephalopathy likely due to noncompliance.  He has also missed numerous PCP appointments.  DVT prophylaxis: heparin injection 5,000 Units Start: 09/14/21 1400   Code Status: Full Code  Family Communication:  None present at bedside.   Status is: Inpatient  Remains inpatient appropriate because:Inpatient level of care appropriate due to severity of illness  Dispo: The patient is from:  Home              Anticipated d/c is to: SNF              Patient currently is not medically stable to d/c.   Difficult to place patient No  Estimated body mass index is 34.96 kg/m as calculated from the following:   Height as of 07/05/21: 6\' 1"  (1.854 m).   Weight as of  this encounter: 120.2 kg.  Nutritional Assessment: Body mass index is 34.96 kg/m.[name] Seen by dietician.  I agree with the assessment and plan as outlined below: Nutrition Status:   Skin Assessment: I have examined the patient's skin and I agree with the wound assessment as performed by the wound care RN as outlined below:    Consultants:  None  Procedures:  None  Antimicrobials:  Anti-infectives (From admission, onward)    None          Subjective: Patient seen and examined.  Sleepy but easily arousable and mostly oriented.  Not agitated at all.  Denies any complaint.  Objective: Vitals:   09/16/21 0634 09/16/21 0700 09/16/21 0758 09/16/21 1108  BP: (!) 146/87 140/87    Pulse: 79 74    Resp: (!) 7 (!) 8    Temp:   (!) 97.5 F (36.4 C) (!) 97.3 F (36.3 C)  TempSrc:   Axillary Oral  SpO2: 97% 100%    Weight:        Intake/Output Summary (Last 24 hours) at 09/16/2021 1127 Last data filed at 09/16/2021 0700 Gross per 24 hour  Intake 1223.35 ml  Output --  Net 1223.35 ml   Filed Weights   09/16/21 0500  Weight: 120.2 kg    Examination:  General exam: Appears sleepy but easily arousable and looks comfortable. Respiratory system: Clear to auscultation. Respiratory effort normal. Cardiovascular system: S1 & S2 heard, RRR. No JVD, murmurs, rubs, gallops or clicks. No pedal edema. Gastrointestinal system: Abdomen is nondistended, soft and nontender. No organomegaly or masses felt. Normal bowel sounds heard. Central nervous system: Sleepy but easily arousable and oriented x2 when woke up.. No focal neurological deficits. Extremities: Symmetric 5 x 5 power.   Data Reviewed: I have personally reviewed following labs and imaging studies  CBC: Recent Labs  Lab 09/14/21 0449 09/15/21 0417  WBC 9.7 10.0  NEUTROABS 6.9  --   HGB 13.4 12.3*  HCT 38.2* 34.5*  MCV 83.6 82.7  PLT 240 431   Basic Metabolic Panel: Recent Labs  Lab 09/14/21 0449  09/14/21 2205 09/15/21 0417 09/15/21 1509 09/16/21 0558  NA 136 140 141 139 138  K 2.8* 3.1* 3.2* 3.3* 3.2*  CL 99 108 110 109 107  CO2 26 24 21* 20* 22  GLUCOSE 456* 110* 141* 109* 115*  BUN 19 20 21* 22* 24*  CREATININE 2.88* 3.19* 3.55* 3.85* 4.05*  CALCIUM 9.3 8.5* 8.7* 8.8* 8.8*  MG  --  1.8 1.7  --   --    GFR: Estimated Creatinine Clearance: 27 mL/min (A) (by C-G formula based on SCr of 4.05 mg/dL (H)). Liver Function Tests: Recent Labs  Lab 09/14/21 0449 09/15/21 0417  AST 19 20  ALT 15 12  ALKPHOS 200* 140*  BILITOT 1.4* 1.4*  PROT 7.8 6.3*  ALBUMIN 3.8 2.9*   No results for input(s): LIPASE, AMYLASE in the last 168 hours. Recent Labs  Lab 09/14/21 0548  AMMONIA 25   Coagulation Profile: No results for input(s): INR, PROTIME in the  last 168 hours. Cardiac Enzymes: No results for input(s): CKTOTAL, CKMB, CKMBINDEX, TROPONINI in the last 168 hours. BNP (last 3 results) No results for input(s): PROBNP in the last 8760 hours. HbA1C: Recent Labs    09/14/21 0952  HGBA1C 8.4*   CBG: Recent Labs  Lab 09/16/21 0328 09/16/21 0433 09/16/21 0618 09/16/21 0756 09/16/21 1106  GLUCAP 61* 109* 108* 121* 104*   Lipid Profile: No results for input(s): CHOL, HDL, LDLCALC, TRIG, CHOLHDL, LDLDIRECT in the last 72 hours. Thyroid Function Tests: No results for input(s): TSH, T4TOTAL, FREET4, T3FREE, THYROIDAB in the last 72 hours. Anemia Panel: No results for input(s): VITAMINB12, FOLATE, FERRITIN, TIBC, IRON, RETICCTPCT in the last 72 hours. Sepsis Labs: No results for input(s): PROCALCITON, LATICACIDVEN in the last 168 hours.  Recent Results (from the past 240 hour(s))  Resp Panel by RT-PCR (Flu A&B, Covid) Nasopharyngeal Swab     Status: None   Collection Time: 09/14/21  7:04 AM   Specimen: Nasopharyngeal Swab; Nasopharyngeal(NP) swabs in vial transport medium  Result Value Ref Range Status   SARS Coronavirus 2 by RT PCR NEGATIVE NEGATIVE Final     Comment: (NOTE) SARS-CoV-2 target nucleic acids are NOT DETECTED.  The SARS-CoV-2 RNA is generally detectable in upper respiratory specimens during the acute phase of infection. The lowest concentration of SARS-CoV-2 viral copies this assay can detect is 138 copies/mL. A negative result does not preclude SARS-Cov-2 infection and should not be used as the sole basis for treatment or other patient management decisions. A negative result may occur with  improper specimen collection/handling, submission of specimen other than nasopharyngeal swab, presence of viral mutation(s) within the areas targeted by this assay, and inadequate number of viral copies(<138 copies/mL). A negative result must be combined with clinical observations, patient history, and epidemiological information. The expected result is Negative.  Fact Sheet for Patients:  EntrepreneurPulse.com.au  Fact Sheet for Healthcare Providers:  IncredibleEmployment.be  This test is no t yet approved or cleared by the [place] FDA and  has been authorized for detection and/or diagnosis of SARS-CoV-2 by FDA under an Emergency Use Authorization (EUA). This EUA will remain  in effect (meaning this test can be used) for the duration of the COVID-19 declaration under Section 564(b)(1) of the Act, 21 U.S.C.section 360bbb-3(b)(1), unless the authorization is terminated  or revoked sooner.       Influenza A by PCR NEGATIVE NEGATIVE Final   Influenza B by PCR NEGATIVE NEGATIVE Final    Comment: (NOTE) The Xpert Xpress SARS-CoV-2/FLU/RSV plus assay is intended as an aid in the diagnosis of influenza from Nasopharyngeal swab specimens and should not be used as a sole basis for treatment. Nasal washings and aspirates are unacceptable for Xpert Xpress SARS-CoV-2/FLU/RSV testing.  Fact Sheet for Patients: EntrepreneurPulse.com.au  Fact Sheet for Healthcare  Providers: IncredibleEmployment.be  This test is not yet approved or cleared by the [place] FDA and has been authorized for detection and/or diagnosis of SARS-CoV-2 by FDA under an Emergency Use Authorization (EUA). This EUA will remain in effect (meaning this test can be used) for the duration of the COVID-19 declaration under Section 564(b)(1) of the Act, 21 U.S.C. section 360bbb-3(b)(1), unless the authorization is terminated or revoked.  Performed at [facility], [place] [address] [place], [place] [address]   MRSA Next Gen by PCR, Nasal     Status: None   Collection Time: 09/14/21 12:44 PM   Specimen: Nasal Mucosa; Nasal Swab  Result Value Ref Range  Status   MRSA by PCR Next Gen NOT DETECTED NOT DETECTED Final    Comment: (NOTE) The GeneXpert MRSA Assay (FDA approved for NASAL specimens only), is one component of a comprehensive MRSA colonization surveillance program. It is not intended to diagnose MRSA infection nor to guide or monitor treatment for MRSA infections. Test performance is not FDA approved in patients less than 69 years old. Performed at [facility], [place] [address], [place], [place] [address]       Radiology Studies: DG Abd 1 View  Result Date: 09/14/2021 CLINICAL DATA:  Impaired enteric catheter feeding EXAM: ABDOMEN - 1 VIEW COMPARISON:  None. FINDINGS: Frontal view of the lower chest and upper abdomen demonstrates enteric catheter tip projecting over the gastric fundus. The side port projects at the gastroesophageal junction. Bowel gas pattern is unremarkable. Lung bases are clear. IMPRESSION: 1. Enteric catheter tip projecting over the gastric fundus. Side port projects at the gastroesophageal junction. Electronically Signed   By: Randa Ngo M.D.   On: 09/14/2021 15:28   US RENAL  Result Date: 09/16/2021 CLINICAL DATA:  Acute kidney injury. EXAM: RENAL / URINARY TRACT ULTRASOUND COMPLETE COMPARISON:  Ultrasound  07/07/2021.  CT 04/05/2020. FINDINGS: Right Kidney: Renal measurements: None 0.9 x 5.2 x 4.4 cm = volume: 120 mL. Echogenicity within normal limits. No mass or hydronephrosis visualized. Left Kidney: Renal measurements: 10.5 x 7.1 x 5.4 cm = volume: 212 mL. Calcification demonstrated in the left kidney measuring 2.5 x 0.9 by 1.6 cm, corresponding to calcified lesion on previous CT. No parenchymal thinning or hydronephrosis. Bladder: Appears normal for degree of bladder distention. Other: None. IMPRESSION: 1. Normal parenchymal thickness and echotexture bilaterally. No hydronephrosis. 2. Calcification in the left kidney corresponding to calcified lesion on prior CT. Calcified lesion seen in the right kidney on prior CT is not visualized sonographically today. These areas correspond to previous cryoablation. Electronically Signed   By: Lucienne Capers M.D.   On: 09/16/2021 01:26   DG Swallowing Func-Speech Pathology  Result Date: 09/15/2021 Table formatting from the original result was not included. 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; cryoablation bil  CVA (cerebral vascular accident) ([place])   stroke  Depression   BH Adm in [place] Depression  Diabetes mellitus   DKA prior hospitalization  Diverticulitis   s/p micorperforation Sept 2012-managed conservatively by Gen surgery  ED (erectile dysfunction)   Focal seizure ([place]) 11/2013  due to ischemic stroke  GERD (gastroesophageal reflux disease)    Hiatal hernia   Hyperlipidemia   Hypertension   Kidney tumor 09/2011  Renal cell CA  Seizures ([place])   none since 2016, taking Keppra - maw  Sleep apnea   Thyroid disease   "weak thyroid" per MD  Wears glasses  Past Surgical History: Past Surgical History: Procedure Laterality Date  COLONOSCOPY    IR RADIOLOGIST EVAL & MGMT  04/04/2017  KIDNEY SURGERY    ablation of renal cell CA - 12/28, prior one was October  2012-Dr. Kathlene Cote  TEE WITHOUT CARDIOVERSION N/A 11/19/2013  Procedure: TRANSESOPHAGEAL ECHOCARDIOGRAM (TEE);  Surgeon: Lelon Perla, MD;  Location: [facility] ENDOSCOPY;  Service: Cardiovascular;  Laterality: N/A; HPI: Pt is a 60 year old male who presented with altered mental status, hyperglycemia and hypertensive emergency. Pt reported choking sensation when he eats. NGT was placed with plan for swallow eval. 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 Pt was referred for an MBS in October, 2021, but it was not completed.  No data recorded Assessment / Plan / Recommendation CHL IP CLINICAL IMPRESSIONS 09/15/2021 Clinical Impression Pt presents with mild pharyngeal dysphagia characterized by reduced lingual retraction, reduced hyolaryngeal elevation and reduced anterior laryngeal movement. He demonstrated trace vallecular residue and inconsistent penetration (PAS 3) of thin liquids. However, bolus sizes were large and penetration was eliminated with cued smaller sips. vallecular residue was reduced with a liquid wash. A dysphagia 3 diet with thin liquids is recommended at this time. SLP will follow briefly to reinforce swallowing precautions and to ensure tolerance. SLP Visit Diagnosis Dysphagia, unspecified (R13.10) Attention and concentration deficit following -- Frontal lobe and executive function deficit following -- Impact on safety and function Mild aspiration risk   CHL IP TREATMENT RECOMMENDATION 09/15/2021  Treatment Recommendations Therapy as outlined in treatment plan below   Prognosis 09/15/2021 Prognosis for Safe Diet Advancement Good Barriers to Reach Goals -- Barriers/Prognosis Comment -- CHL IP DIET RECOMMENDATION 09/15/2021 SLP Diet Recommendations Dysphagia 3 (Mech soft) solids;Thin liquid Liquid Administration via Straw;Cup Medication Administration Whole meds with liquid Compensations Small sips/bites;Slow rate Postural Changes Seated upright at 90 degrees   CHL IP OTHER RECOMMENDATIONS 11/17/2013 Recommended Consults -- Oral Care Recommendations -- Other Recommendations Clarify dietary restrictions   CHL IP FOLLOW UP RECOMMENDATIONS 09/15/2021 Follow up Recommendations None   CHL IP FREQUENCY AND DURATION 09/15/2021 Speech Therapy Frequency (ACUTE ONLY) min 2x/week Treatment Duration 1 week      CHL IP ORAL PHASE 09/15/2021 Oral Phase WFL Oral - Pudding Teaspoon -- Oral - Pudding Cup -- Oral - Honey Teaspoon -- Oral - Honey Cup -- Oral - Nectar Teaspoon -- Oral - Nectar Cup -- Oral - Nectar Straw -- Oral - Thin Teaspoon -- Oral - Thin Cup -- Oral - Thin Straw -- Oral - Puree -- Oral - Mech Soft -- Oral - Regular -- Oral - Multi-Consistency -- Oral - Pill -- Oral Phase - Comment --  CHL IP PHARYNGEAL PHASE 09/15/2021 Pharyngeal Phase Impaired Pharyngeal- Pudding Teaspoon -- Pharyngeal -- Pharyngeal- Pudding Cup -- Pharyngeal -- Pharyngeal- Honey Teaspoon -- Pharyngeal -- Pharyngeal- Honey Cup -- Pharyngeal -- Pharyngeal- Nectar Teaspoon -- Pharyngeal -- Pharyngeal- Nectar Cup -- Pharyngeal -- Pharyngeal- Nectar Straw Reduced tongue base retraction;Pharyngeal residue - valleculae;Reduced laryngeal elevation;Reduced anterior laryngeal mobility Pharyngeal -- Pharyngeal- Thin Teaspoon -- Pharyngeal -- Pharyngeal- Thin Cup Reduced tongue base retraction;Pharyngeal residue - valleculae;Reduced laryngeal elevation;Reduced anterior laryngeal mobility;Penetration/Aspiration during swallow Pharyngeal Material enters  airway, remains ABOVE vocal cords and not ejected out Pharyngeal- Thin Straw Reduced tongue base retraction;Pharyngeal residue - valleculae;Reduced laryngeal elevation;Reduced anterior laryngeal mobility;Penetration/Aspiration during swallow Pharyngeal Material enters airway, remains ABOVE vocal cords and not ejected out Pharyngeal- Puree Reduced tongue base retraction;Pharyngeal residue - valleculae;Reduced laryngeal elevation;Reduced anterior laryngeal mobility Pharyngeal -- Pharyngeal- Mechanical Soft -- Pharyngeal -- Pharyngeal- Regular Reduced tongue base retraction;Pharyngeal residue - valleculae;Reduced laryngeal elevation;Reduced anterior laryngeal mobility Pharyngeal -- Pharyngeal- Multi-consistency -- Pharyngeal -- Pharyngeal- Pill Reduced tongue base retraction;Pharyngeal residue -  valleculae;Reduced laryngeal elevation;Reduced anterior laryngeal mobility Pharyngeal -- Pharyngeal Comment --  CHL IP CERVICAL ESOPHAGEAL PHASE 09/15/2021 Cervical Esophageal Phase WFL Pudding Teaspoon -- Pudding Cup -- Honey Teaspoon -- Honey Cup -- Nectar Teaspoon -- Nectar Cup -- Nectar Straw -- Thin Teaspoon -- Thin Cup -- Thin Straw -- Puree -- Mechanical Soft -- Regular -- Multi-consistency -- Pill -- Cervical Esophageal Comment -- Shanika I. Hardin Negus, [place], [place] Office number [phone] Pager [phone] Horton Marshall 09/15/2021, 3:06 PM              EEG adult  Result Date: 09/14/2021 Lora Havens, MD     09/14/2021  4:55 PM Patient Name: Brandon [name] MRN: [id number] Epilepsy Attending: Lora Havens Referring Physician/Provider: Dr Ina Homes Date: 09/14/2021 Duration: 24.27 mins Patient history: 60 year old man with history of poorly controlled DM, TBI, PTSD, probable schizophrenia, HTN presenting with AMS and combativeness. EEG to evaluate for seizure. Level of alertness: lethargic AEDs during EEG study: LEV Technical aspects: This EEG study was done with  scalp electrodes positioned according to the 10-20 International system of electrode placement. Electrical activity was acquired at a sampling rate of 500Hz  and reviewed with a high frequency filter of 70Hz  and a low frequency filter of 1Hz . EEG data were recorded continuously and digitally stored. Description: No posterior dominant rhythm was seen. EEG showed continuous generalized 3 to 6 Hz theta-delta slowing. Hyperventilation and photic stimulation were not performed.   Of note, study was technically difficult due to significant movement artifact. ABNORMALITY - Continuous slow, generalized IMPRESSION: This technically difficult study is suggestive of moderate diffuse encephalopathy, nonspecific etiology. No seizures or epileptiform discharges were seen throughout the recording. Priyanka O Yadav   VAS [place] LOWER EXTREMITY VENOUS (DVT)  Result Date: 09/15/2021  Lower Venous DVT Study Patient Name:  Brandon [name]  Date of Exam:   09/15/2021 Medical Rec #: [id number]          Accession #:    [id number] Date of Birth: 31-Jan-1961           Patient Gender: M Patient Age:   28 years Exam Location:  [facility] Procedure:      VAS [place] LOWER EXTREMITY VENOUS (DVT) Referring Phys: GRACE BOWSER --------------------------------------------------------------------------------  Indications: Cramping/pain - RLE.  Comparison Study: Previous exam 07/26/2016 was negative for DVT Performing Technologist: Rogelia Rohrer RVT, RDMS  Examination Guidelines: A complete evaluation includes B-mode imaging, spectral Doppler, color Doppler, and power Doppler as needed of all accessible portions of each vessel. Bilateral testing is considered an integral part of a complete examination. Limited examinations for reoccurring indications may be performed as noted. The reflux portion of the exam is performed with the patient in reverse Trendelenburg.  +---------+---------------+---------+-----------+----------+--------------+ RIGHT     CompressibilityPhasicitySpontaneityPropertiesThrombus Aging +---------+---------------+---------+-----------+----------+--------------+ CFV      Full           Yes      Yes                                 +---------+---------------+---------+-----------+----------+--------------+ SFJ      Full                                                        +---------+---------------+---------+-----------+----------+--------------+  FV Prox  Full           Yes      Yes                                 +---------+---------------+---------+-----------+----------+--------------+ FV Mid   Full           Yes      Yes                                 +---------+---------------+---------+-----------+----------+--------------+ FV DistalFull           Yes      Yes                                 +---------+---------------+---------+-----------+----------+--------------+ PFV      Full                                                        +---------+---------------+---------+-----------+----------+--------------+ POP      Full           Yes      Yes                                 +---------+---------------+---------+-----------+----------+--------------+ PTV      Full                                                        +---------+---------------+---------+-----------+----------+--------------+ PERO     Full                                                        +---------+---------------+---------+-----------+----------+--------------+   +----+---------------+---------+-----------+----------+--------------+ LEFTCompressibilityPhasicitySpontaneityPropertiesThrombus Aging +----+---------------+---------+-----------+----------+--------------+ CFV Full           Yes      Yes                                 +----+---------------+---------+-----------+----------+--------------+     Summary: RIGHT: - There is no evidence of deep vein thrombosis in the lower  extremity. - There is no evidence of superficial venous thrombosis.  - No cystic structure found in the popliteal fossa.  LEFT: - No evidence of common femoral vein obstruction.  *See table(s) above for measurements and observations. Electronically signed by Harold Barban MD on 09/15/2021 at 9:21:19 PM.    Final     Scheduled Meds:  amLODipine  10 mg Oral Daily   aspirin EC  81 mg Oral Daily   atorvastatin  40 mg Oral QHS   Chlorhexidine Gluconate Cloth  6 each Topical Daily   ezetimibe  10 mg Oral Daily   heparin  5,000 Units Subcutaneous Q8H   hydrALAZINE  25 mg Oral Q8H  insulin aspart  0-20 Units Subcutaneous Q4H   insulin glargine-yfgn  20 Units Subcutaneous Daily   levETIRAcetam  500 mg Oral BID   pantoprazole  40 mg Oral QHS   QUEtiapine  25 mg Oral QHS   sertraline  25 mg Oral Daily   tamsulosin  0.4 mg Oral Daily   Continuous Infusions:  lactated ringers 75 mL/hr at 09/16/21 0700     LOS: 2 days   Time spent: 37 minutes   Darliss Cheney, MD [facility]  09/16/2021, 11:27 AM  Please page via Shea [place] and do not message via secure chat for anything urgent. Secure chat can be used for anything non urgent.  How to contact the [facility] Attending or Consulting provider [place] or covering provider during after hours [place], for this patient?  Check the care team in [employer] and look for a) attending/consulting [facility] provider listed and b) the [facility] team listed. Page or secure chat [phone]. Log into [email or web address] and use 's universal password to access. If you do not have the password, please contact the hospital operator. Locate the [facility] provider you are looking for under [facility] and page to a number that you can be directly reached. If you still have difficulty reaching the provider, please page the [facility] (Director on Call) for the Hospitalists listed on amion for assistance.
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
## 2021-09-17 LAB — CBC WITH DIFFERENTIAL/PLATELET
Abs Immature Granulocytes: 0.01 10*3/uL (ref 0.00–0.07)
Basophils Absolute: 0 10*3/uL (ref 0.0–0.1)
Basophils Relative: 1 %
Eosinophils Absolute: 0.2 10*3/uL (ref 0.0–0.5)
Eosinophils Relative: 3 %
HCT: 30.5 % — ABNORMAL LOW (ref 39.0–52.0)
Hemoglobin: 10.8 g/dL — ABNORMAL LOW (ref 13.0–17.0)
Immature Granulocytes: 0 %
Lymphocytes Relative: 40 %
Lymphs Abs: 2.3 10*3/uL (ref 0.7–4.0)
MCH: 29.8 pg (ref 26.0–34.0)
MCHC: 35.4 g/dL (ref 30.0–36.0)
MCV: 84.3 fL (ref 80.0–100.0)
Monocytes Absolute: 0.5 10*3/uL (ref 0.1–1.0)
Monocytes Relative: 9 %
Neutro Abs: 2.7 10*3/uL (ref 1.7–7.7)
Neutrophils Relative %: 47 %
Platelets: 211 10*3/uL (ref 150–400)
RBC: 3.62 MIL/uL — ABNORMAL LOW (ref 4.22–5.81)
RDW: 11.9 % (ref 11.5–15.5)
WBC: 5.7 10*3/uL (ref 4.0–10.5)
nRBC: 0 % (ref 0.0–0.2)

## 2021-09-17 LAB — GLUCOSE, CAPILLARY
Glucose-Capillary: 101 mg/dL — ABNORMAL HIGH (ref 70–99)
Glucose-Capillary: 101 mg/dL — ABNORMAL HIGH (ref 70–99)
Glucose-Capillary: 154 mg/dL — ABNORMAL HIGH (ref 70–99)
Glucose-Capillary: 157 mg/dL — ABNORMAL HIGH (ref 70–99)
Glucose-Capillary: 173 mg/dL — ABNORMAL HIGH (ref 70–99)
Glucose-Capillary: 84 mg/dL (ref 70–99)

## 2021-09-17 LAB — COMPREHENSIVE METABOLIC PANEL
ALT: 11 U/L (ref 0–44)
AST: 11 U/L — ABNORMAL LOW (ref 15–41)
Albumin: 2.5 g/dL — ABNORMAL LOW (ref 3.5–5.0)
Alkaline Phosphatase: 112 U/L (ref 38–126)
Anion gap: 7 (ref 5–15)
BUN: 23 mg/dL — ABNORMAL HIGH (ref 6–20)
CO2: 22 mmol/L (ref 22–32)
Calcium: 8.2 mg/dL — ABNORMAL LOW (ref 8.9–10.3)
Chloride: 109 mmol/L (ref 98–111)
Creatinine, Ser: 3.72 mg/dL — ABNORMAL HIGH (ref 0.61–1.24)
GFR, Estimated: 18 mL/min — ABNORMAL LOW (ref >60)
Glucose, Bld: 115 mg/dL — ABNORMAL HIGH (ref 70–99)
Potassium: 3.8 mmol/L (ref 3.5–5.1)
Sodium: 138 mmol/L (ref 135–145)
Total Bilirubin: 0.9 mg/dL (ref 0.3–1.2)
Total Protein: 5.5 g/dL — ABNORMAL LOW (ref 6.5–8.1)

## 2021-09-17 NOTE — Evaluation (Signed)
Physical Therapy Evaluation Patient Details Name: Brandon [name] MRN: [id number] DOB: 09/08/1961 Today's Date: 09/17/2021  History of Present Illness  60 year old male admitted 10/11 with AMS, hyperglycemia and HTN emergency. 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  Clinical Impression  Pt initially agitated with orientation and PLOF questions with pt denying use of gait belt or RW. Pt then with improved willingness and able to ambulate in hall way and up to chair. Pt with guarded unsteady gait who would benefit from RW to improve balance and decrease fall risk but pt denied. Pt with decreased cognition, transfers, balance and gait who will benefit from acute therapy to maximize mobility and independence. Pt would benefit from HHPT and RW but states he will decline these.   BP 133/88 (102), HR 96, 100% on RA       Recommendations for follow up therapy are one component of a multi-disciplinary discharge planning process, led by the attending physician.  Recommendations may be updated based on patient status, additional functional criteria and insurance authorization.  Follow Up Recommendations Home health PT    Equipment Recommendations  Rolling walker with 5" wheels    Recommendations for Other Services       Precautions / Restrictions Precautions Precautions: Fall Precaution Comments: pt refused gait belt or RW      Mobility  Bed Mobility Overal bed mobility: Modified Independent             General bed mobility comments: with HOB 45 degrees pt reports he sleeps in recliner    Transfers Overall transfer level: Needs assistance   Transfers: Sit to/from Stand Sit to Stand: Min guard         General transfer comment: guarding for safety and lines  Ambulation/Gait Ambulation/Gait assistance: Min guard Gait Distance (Feet): 80 Feet Assistive device: None Gait  Pattern/deviations: Step-through pattern;Decreased stride length;Shuffle   Gait velocity interpretation: 1.31 - 2.62 ft/sec, indicative of limited community ambulator General Gait Details: pt with very short, shuffling, guarded gait with periodic reaching out for environmental support. Pt with guarding for safety and lines but denied HHA or use of RW  Stairs            Wheelchair Mobility    Modified Rankin (Stroke Patients Only)       Balance Overall balance assessment: Needs assistance Sitting-balance support: No upper extremity supported;Feet supported Sitting balance-Leahy Scale: Fair Sitting balance - Comments: EOB without physical support   Standing balance support: No upper extremity supported Standing balance-Leahy Scale: Fair Standing balance comment: pt able to stand without UE support, clearly guarded and unsteady with gait but refused AD                             Pertinent Vitals/Pain Pain Assessment: No/denies pain    Home Living Family/patient expects to be discharged to:: Private residence Living Arrangements: Non-relatives/Friends Available Help at Discharge: Friend(s);Available PRN/intermittently Type of Home: Apartment Home Access: Stairs to enter   Entrance Stairs-Number of Steps: 10 Home Layout: One level Home Equipment: Cane - single point Additional Comments: pt states he lives with a roommate and they both sleep in recliners    Prior Function Level of Independence: Needs assistance   Gait / Transfers Assistance Needed: pt states he uses cane sometimes  ADL's / Homemaking Assistance Needed: pt states he will sponge bathe or get in the shower. 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        Extremity/Trunk Assessment   Upper Extremity Assessment Upper Extremity Assessment: Generalized weakness    Lower Extremity Assessment Lower Extremity Assessment: Generalized weakness    Cervical / Trunk Assessment Cervical  / Trunk Assessment: Other exceptions Cervical / Trunk Exceptions: rounded shoulders  Communication   Communication: No difficulties  Cognition Arousal/Alertness: Awake/alert Behavior During Therapy: WFL for tasks assessed/performed Overall Cognitive Status: Impaired/Different from baseline Area of Impairment: Attention;Orientation;Following commands;Problem solving;Safety/judgement                 Orientation Level: Disoriented to;Time Current Attention Level: Selective   Following Commands: Follows one step commands consistently Safety/Judgement: Decreased awareness of safety;Decreased awareness of deficits   Problem Solving: Slow processing;Requires verbal cues General Comments: pt with slow, guarded unsteady gait who would benefit from AD but pt refuses. pt unable to demonstrate understanding of deficits and fall risk, easily agitated with questions for home/function/hobbies      General Comments      Exercises     Assessment/Plan    PT Assessment Patient needs continued PT services  PT Problem List Decreased mobility;Decreased safety awareness;Decreased activity tolerance;Decreased cognition;Decreased balance;Decreased knowledge of use of DME       PT Treatment Interventions Gait training;Therapeutic exercise;Patient/family education;Stair training;Balance training;Functional mobility training;Neuromuscular re-education;Therapeutic activities;DME instruction;Cognitive remediation    PT Goals (Current goals can be found in the Care Plan section)  Acute Rehab PT Goals Patient Stated Goal: return home PT Goal Formulation: With patient Time For Goal Achievement: 10/01/21 Potential to Achieve Goals: Fair    Frequency Min 3X/week   Barriers to discharge Decreased caregiver support      Co-evaluation               AM-PAC PT "6 Clicks" Mobility  Outcome Measure Help needed turning from your back to your side while in a flat bed without using bedrails?: A  Little Help needed moving from lying on your back to sitting on the side of a flat bed without using bedrails?: A Little Help needed moving to and from a bed to a chair (including a wheelchair)?: A Little Help needed standing up from a chair using your arms (e.g., wheelchair or bedside chair)?: A Little Help needed to walk in hospital room?: A Little Help needed climbing 3-5 steps with a railing? : A Little 6 Click Score: 18    End of Session Equipment Utilized During Treatment:  (pt refused gait belt) Activity Tolerance: Patient tolerated treatment well Patient left: in chair;with call bell/phone within reach;with chair alarm set Nurse Communication: Mobility status PT Visit Diagnosis: Other abnormalities of gait and mobility (R26.89);Unsteadiness on feet (R26.81)    Time: 1607-3710 PT Time Calculation (min) (ACUTE ONLY): 27 min   Charges:   PT Evaluation $PT Eval Moderate Complexity: 1 Mod PT Treatments $Gait Training: 8-22 mins        Odies Desa P, PT Acute Rehabilitation Services Pager: [phone] Office: [place] 09/17/2021, 10:24 AM

## 2021-09-17 NOTE — Evaluation (Signed)
Occupational Therapy Evaluation Patient Details Name: BYNUM MCCULLARS MRN: [id number] DOB: 1961-10-17 Today's Date: 09/17/2021   History of Present Illness 60 year old male admitted 10/11 with AMS, hyperglycemia and HTN emergency. 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   Clinical Impression   PTA patient reports independent with ADLs, mobility using cane in community and limited IADLs (not driving).  Admitted for above and limited by problem list below, including generalized weakness, impaired balance, decreased activity tolerance and impaired cognition.  Pt oriented and following commands, but presents with decreased problem solving, awareness and safety. He requires min guard for transfers and limited in room mobility (reaching out for UE support and would benefit from RW use), up to min guard for ADLs.  Believe he will benefit from continued OT services while admitted and after dc at [facility] level to optimize independence and safety with ADLs, IADLs and mobility.  Will follow.      Recommendations for follow up therapy are one component of a multi-disciplinary discharge planning process, led by the attending physician.  Recommendations may be updated based on patient status, additional functional criteria and insurance authorization.   Follow Up Recommendations  Home health OT;Supervision/Assistance - 24 hour    Equipment Recommendations       Recommendations for Other Services       Precautions / Restrictions Precautions Precautions: Fall Precaution Comments: pt refused gait belt or RW Restrictions Weight Bearing Restrictions: No      Mobility Bed Mobility Overal bed mobility: Modified Independent             General bed mobility comments: OOB upon entry    Transfers Overall transfer level: Needs assistance   Transfers: Sit to/from Stand Sit to Stand: Min guard          General transfer comment: for safety and balance    Balance Overall balance assessment: Needs assistance Sitting-balance support: No upper extremity supported;Feet supported Sitting balance-Leahy Scale: Fair Sitting balance - Comments: min guard to supervision dynamically during LB ADLs   Standing balance support: During functional activity;No upper extremity supported Standing balance-Leahy Scale: Fair Standing balance comment: reaching out for UE support dynamically, statically min guard                           ADL either performed or assessed with clinical judgement   ADL Overall ADL's : Needs assistance/impaired     Grooming: Min guard;Wash/dry hands;Standing           Upper Body Dressing : Minimal assistance;Sitting   Lower Body Dressing: Min guard;Sit to/from stand Lower Body Dressing Details (indicate cue type and reason): increased time and effort but able to manage socks, min guard in standing Toilet Transfer: Min guard;Ambulation Toilet Transfer Details (indicate cue type and reason): simulated in room         Functional mobility during ADLs: Min guard;Cueing for safety General ADL Comments: pt limited by impaired balance, L sided weakness, decreased activity tolerance and safety     Vision Ability to See in Adequate Light: 1 Impaired Patient Visual Report: Other (comment) (reports L sided visual loss from CVA)       Perception     Praxis      Pertinent Vitals/Pain Pain Assessment: No/denies pain     Hand Dominance Right   Extremity/Trunk Assessment Upper Extremity Assessment Upper Extremity Assessment: Generalized weakness;LUE deficits/detail LUE Deficits / Details: reports  hx of L sided weakness from CVA, grossly 3-/5 MMT LUE Coordination: decreased gross motor   Lower Extremity Assessment Lower Extremity Assessment: Defer to PT evaluation   Cervical / Trunk Assessment Cervical / Trunk Assessment: Other exceptions Cervical  / Trunk Exceptions: rounded shoulders   Communication Communication Communication: No difficulties   Cognition Arousal/Alertness: Awake/alert Behavior During Therapy: WFL for tasks assessed/performed Overall Cognitive Status: Impaired/Different from baseline Area of Impairment: Safety/judgement;Awareness;Problem solving                 Orientation Level: Disoriented to;Time Current Attention Level: Selective   Following Commands: Follows one step commands consistently Safety/Judgement: Decreased awareness of safety;Decreased awareness of deficits Awareness: Emergent Problem Solving: Slow processing;Requires verbal cues General Comments: pt requires increased time for problem solving, sequencing; poor safety awareness and insight to deficits   General Comments       Exercises     Shoulder Instructions      Home Living Family/patient expects to be discharged to:: Private residence Living Arrangements: Non-relatives/Friends Available Help at Discharge: Friend(s);Available PRN/intermittently Type of Home: Apartment Home Access: Stairs to enter Entrance Stairs-Number of Steps: 10   Home Layout: One level     Bathroom Shower/Tub: Tub/shower unit;Walk-in shower   Bathroom Toilet: Standard     Home Equipment: Cane - single point;Walker - standard   Additional Comments: pt states he lives with a roommate and they both sleep in recliners      Prior Functioning/Environment Level of Independence: Needs assistance  Gait / Transfers Assistance Needed: pt states he uses cane for community mobility ADL's / Homemaking Assistance Needed: independent ADLs, limited IADLs (cold meals), does not work or drive            OT Problem List: Decreased strength;Decreased activity tolerance;Impaired balance (sitting and/or standing);Decreased cognition;Decreased coordination;Decreased safety awareness;Decreased knowledge of use of DME or AE;Decreased knowledge of  precautions;Impaired UE functional use      OT Treatment/Interventions: Self-care/ADL training;Energy conservation;DME and/or AE instruction;Therapeutic activities;Patient/family education;Balance training;Therapeutic exercise    OT Goals(Current goals can be found in the care plan section) Acute Rehab OT Goals Patient Stated Goal: return home OT Goal Formulation: With patient Time For Goal Achievement: 10/01/21 Potential to Achieve Goals: Good  OT Frequency: Min 2X/week   Barriers to D/C:            Co-evaluation              AM-PAC OT "6 Clicks" Daily Activity     Outcome Measure Help from another person eating meals?: A Little Help from another person taking care of personal grooming?: A Little Help from another person toileting, which includes using toliet, bedpan, or urinal?: A Little Help from another person bathing (including washing, rinsing, drying)?: A Little Help from another person to put on and taking off regular upper body clothing?: A Little Help from another person to put on and taking off regular lower body clothing?: A Little 6 Click Score: 18   End of Session Nurse Communication: Mobility status  Activity Tolerance: Patient tolerated treatment well Patient left: in chair;with chair alarm set;with call bell/phone within reach  OT Visit Diagnosis: Other abnormalities of gait and mobility (R26.89);Muscle weakness (generalized) (M62.81);Other symptoms and signs involving cognitive function                Time: 1031-1053 OT Time Calculation (min): 22 min Charges:  OT General Charges $OT Visit: 1 Visit OT Evaluation $OT Eval Moderate Complexity: 1 Mod  Laruen Risser B,  OT Acute Rehabilitation Services Pager [phone] Office [phone]   Delight Stare 09/17/2021, 12:08 PM

## 2021-09-17 NOTE — Progress Notes (Signed)
Speech Language Pathology Treatment: Dysphagia  Patient Details Name: Brandon [name] MRN: [id number] DOB: 04/17/1961 Today's Date: 09/17/2021 Time: 7416-3845 SLP Time Calculation (min) (ACUTE ONLY): 9 min  Assessment / Plan / Recommendation Clinical Impression  Pt was seen for dysphagia treatment and he was cooperative throughout the session. Pt, and Sarah, RN reported that the pt has been tolerating the current diet without overt s/sx of aspiration. Pt reported that some meats and foods have been too hard for him to eat without his maxillary dentures. Pt stated that he cannot have them brought to the hospital today, but he agreed that he would not like a more modified diet. Pt tolerated regular texture solids, and thin liquids via straw without symptoms of oropharyngeal dysphagia. Pt was re-educated regarding swallowing precautions, but he independently reduced bolus sizes during the session. It is recommended that the current diet of dysphagia 3 solids and thin liquids be continued. However, pt's diet may be advanced further to regular texture solids and thin liquids if pt subsequently desires. Further skilled SLP services are not clinically indicated at this time.    HPI HPI: Pt is a 60 year old male who presented with altered mental status, hyperglycemia and hypertensive emergency. Pt reported choking sensation when he eats. NGT was placed with plan for swallow eval. 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 Pt was referred for an MBS in October, 2021, but it was not completed.      SLP Plan  All goals met;Discharge SLP treatment due to (comment)      Recommendations for follow up therapy are one component of a multi-disciplinary discharge planning process, led by the attending physician.  Recommendations may be updated based on patient status, additional functional criteria and insurance  authorization.    Recommendations  Diet recommendations: Dysphagia 3 (mechanical soft);Thin liquid (May be advanced to regular subsequently if pt's dentures are brought/pt wishes) Liquids provided via: Cup;Straw Medication Administration: Whole meds with liquid Supervision: Patient able to self feed Compensations: Small sips/bites;Slow rate Postural Changes and/or Swallow Maneuvers: Seated upright 90 degrees                Oral Care Recommendations: Oral care BID Follow up Recommendations: None SLP Visit Diagnosis: Dysphagia, unspecified (R13.10) Plan: All goals met;Discharge SLP treatment due to (comment)       Tiondra Fang I. Hardin Negus, [place], [place] Office number [phone] Pager [place]  09/17/2021, 11:49 AM

## 2021-09-17 NOTE — Progress Notes (Signed)
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  History obtained per chart review and patient's wife due to his altered mental status.  His wife called EMS because of a high blood glucose at 3 AM this morning.  She gave him NovoLog 18 units prior to EMS arrival.  She reports he was confused and disoriented, acting abnormal at home.  Compliance with his medications has been an ongoing issue.  Wife reports that he is presented similarly in the setting of high blood pressure and high blood sugars in the past.  He was recently hospitalized with hypertensive encephalopathy in July 2022.    On EMS arrival his blood pressure was 220/120 and blood sugar was 469 on arrival to ED.  He was severely agitated in the ED requiring Geodon and subsequently Ativan and Benadryl to keep him in bed and obtain IV access.  Wife states that he started acting abnormal at home and suspects that he has not been taking his medications.  This has been an ongoing issue with multiple hospitalizations.    Assessment & Plan:   Active Problems:   Hypertensive emergency  Hypertensive emergency: UDS negative.  Patient was admitted by ICU team and started on Cardene drip initially.  Now blood pressure controlled on Norvasc and hydralazine.  Monitor.  Acute encephalopathy: Likely secondary to hypertensive emergency.  CT head negative for any acute pathology.  Today, patient is fully alert and oriented.  Delirium: Resolved.  Poorly controlled type 2 diabetes mellitus with hyperglycemia: Hemoglobin A1c 8.4 this admission.  Semglee 20 units was stopped yesterday due to hypoglycemia.   We will discontinue that and continue SSI only.  AKI on CKD stage IIIb vs ckd stage 4: From chart review, it appears that patient has been having varied levels of creatinine ranging anywhere from 1.9-2.7, fluctuating between CKD stage IIIb and CKD stage IV.  He came in with worsening renal function.  Which has further worsened  to 4.05 yesterday but improved to 3.8 today however urine output remains low.  Will increase LR to 100 cc/h.  Monitor daily BMP.  History of seizure disorder: Continue Keppra.  Hypokalemia: Resolved  Hypomagnesemia: Resolved.  We will recheck.  Elevated LFTs: Resolved.  Hyperlipidemia: Continue statin.  Dysphagia: S/p MBS and SLP following, currently on dysphagia 3 diet.  Noncompliance: It appears that patient has had several admissions due to hypertensive urgency and acute encephalopathy likely due to noncompliance.  He has also missed numerous PCP appointments.  DVT prophylaxis: heparin injection 5,000 Units Start: 09/14/21 1400   Code Status: Full Code  Family Communication:  None present at bedside.   Status is: Inpatient  Remains inpatient appropriate because:Inpatient level of care appropriate due to severity of illness  Dispo: The patient is from: Home              Anticipated d/c is to: SNF              Patient currently is not medically stable to d/c.   Difficult to place patient No  Estimated body mass index is 36.39 kg/m as calculated from the following:   Height as of 07/05/21: 6\' 1"  (1.854 m).   Weight as of this  encounter: 125.1 kg.  Nutritional Assessment: Body mass index is 36.39 kg/m.[name] Seen by dietician.  I agree with the assessment and plan as outlined below: Nutrition Status:   Skin Assessment: I have examined the patient's skin and I agree with the wound assessment as performed by the wound care RN as outlined below:    Consultants:  None  Procedures:  None  Antimicrobials:  Anti-infectives (From admission, onward)     None          Subjective: Seen and examined.  He is fully alert and oriented and he has no complaint today.  Objective: Vitals:   09/17/21 0500 09/17/21 0600 09/17/21 0739 09/17/21 1010  BP: 133/73 (!) 150/86  133/88  Pulse: 70 72  99  Resp: 13 10    Temp:   98.2 F (36.8 C)   TempSrc:   Oral   SpO2: 96% 97%  98%  Weight: 125.1 kg       Intake/Output Summary (Last 24 hours) at 09/17/2021 1050 Last data filed at 09/17/2021 0800 Gross per 24 hour  Intake 1961.68 ml  Output 625 ml  Net 1336.68 ml    Filed Weights   09/16/21 0500 09/17/21 0500  Weight: 120.2 kg 125.1 kg    Examination:  General exam: Appears calm and comfortable  Respiratory system: Clear to auscultation. Respiratory effort normal. Cardiovascular system: S1 & S2 heard, RRR. No JVD, murmurs, rubs, gallops or clicks. No pedal edema. Gastrointestinal system: Abdomen is nondistended, soft and nontender. No organomegaly or masses felt. Normal bowel sounds heard. Central nervous system: Alert and oriented. No focal neurological deficits. Extremities: Symmetric 5 x 5 power. Skin: No rashes, lesions or ulcers.  Psychiatry: Judgement and insight appear normal. Mood & affect appropriate.   Data Reviewed: I have personally reviewed following labs and imaging studies  CBC: Recent Labs  Lab 09/14/21 0449 09/15/21 0417 09/17/21 0657  WBC 9.7 10.0 5.7  NEUTROABS 6.9  --  2.7  HGB 13.4 12.3* 10.8*  HCT 38.2* 34.5* 30.5*  MCV 83.6 82.7 84.3  PLT 240 234 767    Basic Metabolic Panel: Recent Labs  Lab 09/14/21 2205 09/15/21 0417 09/15/21 1509 09/16/21 0558 09/17/21 0657  NA 140 141 139 138 138  K 3.1* 3.2* 3.3* 3.2* 3.8  CL 108 110 109 107 109  CO2 24 21* 20* 22 22  GLUCOSE 110* 141* 109* 115* 115*  BUN 20 21* 22* 24* 23*  CREATININE 3.19* 3.55* 3.85* 4.05* 3.72*  CALCIUM 8.5* 8.7* 8.8* 8.8* 8.2*  MG 1.8 1.7  --  2.2  --     GFR: Estimated Creatinine Clearance: 30 mL/min (A) (by C-G formula  based on SCr of 3.72 mg/dL (H)). Liver Function Tests: Recent Labs  Lab 09/14/21 0449 09/15/21 0417 09/17/21 0657  AST 19 20 11*  ALT 15 12 11   ALKPHOS 200* 140* 112  BILITOT 1.4* 1.4* 0.9  PROT 7.8 6.3* 5.5*  ALBUMIN 3.8 2.9* 2.5*    No results for input(s): LIPASE, AMYLASE in the last 168 hours. Recent Labs  Lab 09/14/21 0548  AMMONIA 25    Coagulation Profile: No results for input(s): INR, PROTIME in the last 168 hours. Cardiac Enzymes: No results for input(s): CKTOTAL, CKMB, CKMBINDEX, TROPONINI in the last 168 hours. BNP (last 3 results) No results for input(s): PROBNP in the last 8760 hours. HbA1C: No results for input(s): HGBA1C in the last 72 hours.  CBG: Recent Labs  Lab 09/16/21 1526 09/16/21 1939 09/16/21 2327 09/17/21  2725 09/17/21 0737  GLUCAP 129* 169* 98 101* 101*    Lipid Profile: No results for input(s): CHOL, HDL, LDLCALC, TRIG, CHOLHDL, LDLDIRECT in the last 72 hours. Thyroid Function Tests: No results for input(s): TSH, T4TOTAL, FREET4, T3FREE, THYROIDAB in the last 72 hours. Anemia Panel: No results for input(s): VITAMINB12, FOLATE, FERRITIN, TIBC, IRON, RETICCTPCT in the last 72 hours. Sepsis Labs: No results for input(s): PROCALCITON, LATICACIDVEN in the last 168 hours.  Recent Results (from the past 240 hour(s))  Resp Panel by RT-PCR (Flu A&B, Covid) Nasopharyngeal Swab     Status: None   Collection Time: 09/14/21  7:04 AM   Specimen: Nasopharyngeal Swab; Nasopharyngeal(NP) swabs in vial transport medium  Result Value Ref Range Status   SARS Coronavirus 2 by RT PCR NEGATIVE NEGATIVE Final    Comment: (NOTE) SARS-CoV-2 target nucleic acids are NOT DETECTED.  The SARS-CoV-2 RNA is generally detectable in upper respiratory specimens during the acute phase of infection. The lowest concentration of SARS-CoV-2 viral copies this assay can detect is 138 copies/mL. A negative result does not preclude SARS-Cov-2 infection and should not be  used as the sole basis for treatment or other patient management decisions. A negative result may occur with  improper specimen collection/handling, submission of specimen other than nasopharyngeal swab, presence of viral mutation(s) within the areas targeted by this assay, and inadequate number of viral copies(<138 copies/mL). A negative result must be combined with clinical observations, patient history, and epidemiological information. The expected result is Negative.  Fact Sheet for Patients:  EntrepreneurPulse.com.au  Fact Sheet for Healthcare Providers:  IncredibleEmployment.be  This test is no t yet approved or cleared by the [place] FDA and  has been authorized for detection and/or diagnosis of SARS-CoV-2 by FDA under an Emergency Use Authorization (EUA). This EUA will remain  in effect (meaning this test can be used) for the duration of the COVID-19 declaration under Section 564(b)(1) of the Act, 21 U.S.C.section 360bbb-3(b)(1), unless the authorization is terminated  or revoked sooner.       Influenza A by PCR NEGATIVE NEGATIVE Final   Influenza B by PCR NEGATIVE NEGATIVE Final    Comment: (NOTE) The Xpert Xpress SARS-CoV-2/FLU/RSV plus assay is intended as an aid in the diagnosis of influenza from Nasopharyngeal swab specimens and should not be used as a sole basis for treatment. Nasal washings and aspirates are unacceptable for Xpert Xpress SARS-CoV-2/FLU/RSV testing.  Fact Sheet for Patients: EntrepreneurPulse.com.au  Fact Sheet for Healthcare Providers: IncredibleEmployment.be  This test is not yet approved or cleared by the [place] FDA and has been authorized for detection and/or diagnosis of SARS-CoV-2 by FDA under an Emergency Use Authorization (EUA). This EUA will remain in effect (meaning this test can be used) for the duration of the COVID-19 declaration under Section  564(b)(1) of the Act, 21 U.S.C. section 360bbb-3(b)(1), unless the authorization is terminated or revoked.  Performed at [facility], [place] [address], [place], [place] [address]   MRSA Next Gen by PCR, Nasal     Status: None   Collection Time: 09/14/21 12:44 PM   Specimen: Nasal Mucosa; Nasal Swab  Result Value Ref Range Status   MRSA by PCR Next Gen NOT DETECTED NOT DETECTED Final    Comment: (NOTE) The GeneXpert MRSA Assay (FDA approved for NASAL specimens only), is one component of a comprehensive MRSA colonization surveillance program. It is not intended to diagnose MRSA infection nor to guide or monitor treatment for MRSA infections. Test  performance is not FDA approved in patients less than 20 years old. Performed at [facility], [place] [address] [place], [place] [address]        Radiology Studies: US RENAL  Result Date: 09/16/2021 CLINICAL DATA:  Acute kidney injury. EXAM: RENAL / URINARY TRACT ULTRASOUND COMPLETE COMPARISON:  Ultrasound 07/07/2021.  CT 04/05/2020. FINDINGS: Right Kidney: Renal measurements: None 0.9 x 5.2 x 4.4 cm = volume: 120 mL. Echogenicity within normal limits. No mass or hydronephrosis visualized. Left Kidney: Renal measurements: 10.5 x 7.1 x 5.4 cm = volume: 212 mL. Calcification demonstrated in the left kidney measuring 2.5 x 0.9 by 1.6 cm, corresponding to calcified lesion on previous CT. No parenchymal thinning or hydronephrosis. Bladder: Appears normal for degree of bladder distention. Other: None. IMPRESSION: 1. Normal parenchymal thickness and echotexture bilaterally. No hydronephrosis. 2. Calcification in the left kidney corresponding to calcified lesion on prior CT. Calcified lesion seen in the right kidney on prior CT is not visualized sonographically today. These areas correspond to previous cryoablation. Electronically Signed   By: Lucienne Capers M.D.   On: 09/16/2021 01:26   DG Swallowing Func-Speech Pathology  Result Date:  09/15/2021 Table formatting from the original result was not included. 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; cryoablation bil  CVA (cerebral vascular accident) ([place])   stroke  Depression   BH Adm in [place] Depression  Diabetes mellitus   DKA prior hospitalization  Diverticulitis   s/p micorperforation Sept 2012-managed conservatively by Gen surgery  ED (erectile dysfunction)   Focal seizure ([place]) 11/2013  due to ischemic stroke  GERD (gastroesophageal reflux disease)   Hiatal hernia   Hyperlipidemia   Hypertension   Kidney tumor 09/2011  Renal cell CA  Seizures ([place])   none since 2016, taking Keppra - maw  Sleep apnea   Thyroid disease   "weak thyroid" per MD  Wears glasses  Past Surgical History: Past Surgical History: Procedure Laterality Date  COLONOSCOPY    IR RADIOLOGIST EVAL & MGMT  04/04/2017  KIDNEY SURGERY    ablation of renal cell CA - 12/28, prior one was October [place]  TEE WITHOUT CARDIOVERSION N/A 11/19/2013  Procedure: TRANSESOPHAGEAL ECHOCARDIOGRAM (TEE);  Surgeon: Lelon Perla, MD;  Location: [facility] ENDOSCOPY;  Service: Cardiovascular;  Laterality: N/A; HPI: Pt is a 60 year old male who presented with altered mental status, hyperglycemia and hypertensive emergency. Pt reported choking sensation when he eats. NGT was placed with plan for swallow eval. PMH: CVA in 2013 with residual left-sided deficits, prior seizures, schizophrenia, uncontrolled type 2 diabetes and  medication noncompliance. Pt was referred for an MBS in October, 2021, but it was not completed.  No data recorded Assessment / Plan / Recommendation CHL IP CLINICAL IMPRESSIONS 09/15/2021 Clinical Impression Pt presents with mild pharyngeal dysphagia characterized by reduced lingual retraction, reduced hyolaryngeal elevation and reduced anterior laryngeal movement. He demonstrated trace vallecular residue and inconsistent penetration (PAS 3) of thin liquids. However, bolus sizes were large and penetration  was eliminated with cued smaller sips. vallecular residue was reduced with a liquid wash. A dysphagia 3 diet with thin liquids is recommended at this time. SLP will follow briefly to reinforce swallowing precautions and to ensure tolerance. SLP Visit Diagnosis Dysphagia, unspecified (R13.10) Attention and concentration deficit following -- Frontal lobe and executive function deficit following -- Impact on safety and function Mild aspiration risk   CHL IP TREATMENT RECOMMENDATION 09/15/2021 Treatment Recommendations Therapy as outlined in treatment plan below   Prognosis 09/15/2021 Prognosis for Safe Diet Advancement Good Barriers to Reach Goals -- Barriers/Prognosis Comment -- CHL IP DIET RECOMMENDATION 09/15/2021 SLP Diet Recommendations Dysphagia 3 (Mech soft) solids;Thin liquid Liquid Administration via Straw;Cup Medication Administration Whole meds with liquid Compensations Small sips/bites;Slow rate Postural Changes Seated upright at 90 degrees   CHL IP OTHER RECOMMENDATIONS 11/17/2013 Recommended Consults -- Oral Care Recommendations -- Other Recommendations Clarify dietary restrictions   CHL IP FOLLOW UP RECOMMENDATIONS 09/15/2021 Follow up Recommendations None   CHL IP FREQUENCY AND DURATION 09/15/2021 Speech Therapy Frequency (ACUTE ONLY) min 2x/week Treatment Duration 1 week      CHL IP ORAL PHASE 09/15/2021 Oral Phase WFL Oral - Pudding Teaspoon -- Oral - Pudding Cup -- Oral - Honey Teaspoon -- Oral -  Honey Cup -- Oral - Nectar Teaspoon -- Oral - Nectar Cup -- Oral - Nectar Straw -- Oral - Thin Teaspoon -- Oral - Thin Cup -- Oral - Thin Straw -- Oral - Puree -- Oral - Mech Soft -- Oral - Regular -- Oral - Multi-Consistency -- Oral - Pill -- Oral Phase - Comment --  CHL IP PHARYNGEAL PHASE 09/15/2021 Pharyngeal Phase Impaired Pharyngeal- Pudding Teaspoon -- Pharyngeal -- Pharyngeal- Pudding Cup -- Pharyngeal -- Pharyngeal- Honey Teaspoon -- Pharyngeal -- Pharyngeal- Honey Cup -- Pharyngeal -- Pharyngeal- Nectar Teaspoon -- Pharyngeal -- Pharyngeal- Nectar Cup -- Pharyngeal -- Pharyngeal- Nectar Straw Reduced tongue base retraction;Pharyngeal residue - valleculae;Reduced laryngeal elevation;Reduced anterior laryngeal mobility Pharyngeal -- Pharyngeal- Thin Teaspoon -- Pharyngeal -- Pharyngeal- Thin Cup Reduced tongue base retraction;Pharyngeal residue - valleculae;Reduced laryngeal elevation;Reduced anterior laryngeal mobility;Penetration/Aspiration during swallow Pharyngeal Material enters airway, remains ABOVE vocal cords and not ejected out Pharyngeal- Thin Straw Reduced tongue base retraction;Pharyngeal residue - valleculae;Reduced laryngeal elevation;Reduced anterior laryngeal mobility;Penetration/Aspiration during swallow Pharyngeal Material enters airway, remains ABOVE vocal cords and not ejected out Pharyngeal- Puree Reduced tongue base retraction;Pharyngeal residue - valleculae;Reduced laryngeal elevation;Reduced anterior laryngeal mobility Pharyngeal -- Pharyngeal- Mechanical Soft -- Pharyngeal -- Pharyngeal- Regular Reduced tongue base retraction;Pharyngeal residue - valleculae;Reduced laryngeal elevation;Reduced anterior laryngeal mobility Pharyngeal -- Pharyngeal- Multi-consistency -- Pharyngeal -- Pharyngeal- Pill Reduced tongue base retraction;Pharyngeal residue - valleculae;Reduced laryngeal elevation;Reduced anterior laryngeal mobility Pharyngeal -- Pharyngeal Comment --  CHL IP CERVICAL ESOPHAGEAL  PHASE 09/15/2021 Cervical Esophageal Phase WFL Pudding Teaspoon -- Pudding Cup -- Honey Teaspoon -- Honey Cup -- Nectar Teaspoon -- Nectar Cup -- Nectar Straw -- Thin Teaspoon -- Thin Cup -- Thin Straw -- Puree -- Mechanical Soft -- Regular -- Multi-consistency -- Pill -- Cervical Esophageal Comment -- Shanika I. Hardin Negus, [place], [place] Office number [phone] Pager [place] 09/15/2021, 3:06 PM              VAS [place] LOWER EXTREMITY VENOUS (DVT)  Result Date: 09/15/2021  Lower Venous DVT Study Patient Name:  EFREN KROSS  Date of Exam:   09/15/2021 Medical Rec #: [id number]          Accession #:    [id number] Date  of Birth: 08/01/1961           Patient Gender: M Patient Age:   3 years Exam Location:  [facility] Procedure:      VAS [place] LOWER EXTREMITY VENOUS (DVT) Referring Phys: GRACE BOWSER --------------------------------------------------------------------------------  Indications: Cramping/pain - RLE.  Comparison Study: Previous exam 07/26/2016 was negative for DVT Performing Technologist: Rogelia Rohrer RVT, RDMS  Examination Guidelines: A complete evaluation includes B-mode imaging, spectral Doppler, color Doppler, and power Doppler as needed of all accessible portions of each vessel. Bilateral testing is considered an integral part of a complete examination. Limited examinations for reoccurring indications may be performed as noted. The reflux portion of the exam is performed with the patient in reverse Trendelenburg.  +---------+---------------+---------+-----------+----------+--------------+ RIGHT    CompressibilityPhasicitySpontaneityPropertiesThrombus Aging +---------+---------------+---------+-----------+----------+--------------+ CFV      Full           Yes      Yes                                 +---------+---------------+---------+-----------+----------+--------------+ SFJ      Full                                                         +---------+---------------+---------+-----------+----------+--------------+ FV Prox  Full           Yes      Yes                                 +---------+---------------+---------+-----------+----------+--------------+ FV Mid   Full           Yes      Yes                                 +---------+---------------+---------+-----------+----------+--------------+ FV DistalFull           Yes      Yes                                 +---------+---------------+---------+-----------+----------+--------------+ PFV      Full                                                        +---------+---------------+---------+-----------+----------+--------------+ POP      Full           Yes      Yes                                 +---------+---------------+---------+-----------+----------+--------------+ PTV      Full                                                        +---------+---------------+---------+-----------+----------+--------------+  PERO     Full                                                        +---------+---------------+---------+-----------+----------+--------------+   +----+---------------+---------+-----------+----------+--------------+ LEFTCompressibilityPhasicitySpontaneityPropertiesThrombus Aging +----+---------------+---------+-----------+----------+--------------+ CFV Full           Yes      Yes                                 +----+---------------+---------+-----------+----------+--------------+     Summary: RIGHT: - There is no evidence of deep vein thrombosis in the lower extremity. - There is no evidence of superficial venous thrombosis.  - No cystic structure found in the popliteal fossa.  LEFT: - No evidence of common femoral vein obstruction.  *See table(s) above for measurements and observations. Electronically signed by Harold Barban MD on 09/15/2021 at 9:21:19 PM.    Final     Scheduled Meds:  amLODipine  10 mg  Oral Daily   aspirin EC  81 mg Oral Daily   atorvastatin  40 mg Oral QHS   Chlorhexidine Gluconate Cloth  6 each Topical Daily   ezetimibe  10 mg Oral Daily   heparin  5,000 Units Subcutaneous Q8H   hydrALAZINE  25 mg Oral Q8H   insulin aspart  0-20 Units Subcutaneous Q4H   levETIRAcetam  500 mg Oral BID   pantoprazole  40 mg Oral QHS   QUEtiapine  25 mg Oral QHS   sertraline  25 mg Oral Daily   tamsulosin  0.4 mg Oral Daily   Continuous Infusions:  lactated ringers 75 mL/hr at 09/17/21 0600     LOS: 3 days   Time spent: 30 minutes   Darliss Cheney, MD [facility]  09/17/2021, 10:50 AM  Please page via [place] and do not message via secure chat for anything urgent. Secure chat can be used for anything non urgent.  How to contact the [facility] Attending or Consulting provider [place] or covering provider during after hours [place], for this patient?  Check the care team in [facility] and look for a) attending/consulting TR[facility] provider listed and b) the [facility] team listed. Page or secure chat [phone]. Log into [email or web address] and use [place]'s universal password to access. If you do not have the password, please contact the hospital operator. Locate the [facility] provider you are looking for under [facility] and page to a number that you can be directly reached. If you still have difficulty reaching the provider, please page the [facility] (Director on Call) for the Hospitalists listed on amion for assistance.

## 2021-09-18 LAB — BASIC METABOLIC PANEL
Anion gap: 7 (ref 5–15)
BUN: 23 mg/dL — ABNORMAL HIGH (ref 6–20)
CO2: 21 mmol/L — ABNORMAL LOW (ref 22–32)
Calcium: 8.3 mg/dL — ABNORMAL LOW (ref 8.9–10.3)
Chloride: 110 mmol/L (ref 98–111)
Creatinine, Ser: 3.35 mg/dL — ABNORMAL HIGH (ref 0.61–1.24)
GFR, Estimated: 20 mL/min — ABNORMAL LOW (ref >60)
Glucose, Bld: 99 mg/dL (ref 70–99)
Potassium: 3.8 mmol/L (ref 3.5–5.1)
Sodium: 138 mmol/L (ref 135–145)

## 2021-09-18 LAB — GLUCOSE, CAPILLARY
Glucose-Capillary: 102 mg/dL — ABNORMAL HIGH (ref 70–99)
Glucose-Capillary: 109 mg/dL — ABNORMAL HIGH (ref 70–99)
Glucose-Capillary: 167 mg/dL — ABNORMAL HIGH (ref 70–99)
Glucose-Capillary: 179 mg/dL — ABNORMAL HIGH (ref 70–99)
Glucose-Capillary: 198 mg/dL — ABNORMAL HIGH (ref 70–99)
Glucose-Capillary: 137 mg/dL — ABNORMAL HIGH (ref 70–99)

## 2021-09-18 MED ORDER — LACTATED RINGERS IV SOLN: INTRAVENOUS | Status: DC

## 2021-09-18 NOTE — Progress Notes (Signed)
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  History obtained per chart review and patient's wife due to his altered mental status.  His wife called EMS because of a high blood glucose at 3 AM this morning.  She gave him NovoLog 18 units prior to EMS arrival.  She reports he was confused and disoriented, acting abnormal at home.  Compliance with his medications has been an ongoing issue.  Wife reports that he is presented similarly in the setting of high blood pressure and high blood sugars in the past.  He was recently hospitalized with hypertensive encephalopathy in July 2022.    On EMS arrival his blood pressure was 220/120 and blood sugar was 469 on arrival to ED.  He was severely agitated in the ED requiring Geodon and subsequently Ativan and Benadryl to keep him in bed and obtain IV access.  Wife states that he started acting abnormal at home and suspects that he has not been taking his medications.  This has been an ongoing issue with multiple hospitalizations.    Assessment & Plan:   Active Problems:   Hypertensive emergency  Hypertensive emergency: UDS negative.  Patient was admitted by ICU team and started on Cardene drip initially.  Now blood pressure controlled on Norvasc and hydralazine.  Monitor.  Acute encephalopathy: Likely secondary to hypertensive emergency.  CT head negative for any acute pathology.  Today, patient is fully alert and oriented.  Delirium: Resolved.  Poorly controlled type 2 diabetes mellitus with hyperglycemia: Blood sugar very well controlled on SSI only.  Hemoglobin A1c 8.4.  AKI on CKD stage IIIb vs  ckd stage 4: From chart review, it appears that patient has been having varied levels of creatinine ranging anywhere from 1.9-2.7, fluctuating between CKD stage IIIb and CKD stage IV.  He came in with worsening renal function.  Which has further worsened  to 4.05 09/16/2021 and has a started to improve and currently 3.35.  Has finally picked up his urine output.  Does not have edema.  We will resume LR at 2 PM today.  History of seizure disorder: Continue Keppra.  Hypokalemia: Resolved  Hypomagnesemia: Resolved.  We will recheck.  Elevated LFTs: Resolved.  Hyperlipidemia: Continue statin.  Dysphagia: S/p MBS and SLP following, currently on dysphagia 3 diet.  Noncompliance: It appears that patient has had several admissions due to hypertensive urgency and acute encephalopathy likely due to noncompliance.  He has also missed numerous PCP appointments.  DVT prophylaxis: heparin injection 5,000 Units Start: 09/14/21 1400   Code Status: Full Code  Family Communication:  None present at bedside.   Status is: Inpatient  Remains inpatient appropriate because:Inpatient level of care appropriate due to severity of illness  Dispo: The patient is from: Home              Anticipated d/c is to: SNF              Patient currently is not medically stable to d/c.   Difficult to place patient No  Estimated body mass index is 36.39 kg/m as calculated from the following:   Height as of 07/05/21: 6\' 1"  (1.854 m).   Weight as of this encounter: 125.1  kg.  Nutritional Assessment: Body mass index is 36.39 kg/m.[name] Seen by dietician.  I agree with the assessment and plan as outlined below: Nutrition Status:   Skin Assessment: I have examined the patient's skin and I agree with the wound assessment as performed by the wound care RN as outlined below:    Consultants:  None  Procedures:  None  Antimicrobials:  Anti-infectives (From admission, onward)    None          Subjective: Patient  seen and examined.  He is sitting in the chair.  He is fully alert and oriented and very pleasant.  No complaints.  Objective: Vitals:   09/18/21 0400 09/18/21 0500 09/18/21 0600 09/18/21 0730  BP: (!) 145/96 (!) 150/83 (!) 154/85   Pulse: 72 65 77   Resp: (!) 8 15 15    Temp:    97.7 F (36.5 C)  TempSrc:    Oral  SpO2: 98% 98% 98%   Weight:  125.1 kg      Intake/Output Summary (Last 24 hours) at 09/18/2021 1046 Last data filed at 09/18/2021 0943 Gross per 24 hour  Intake 1799.77 ml  Output 1425 ml  Net 374.77 ml    Filed Weights   09/16/21 0500 09/17/21 0500 09/18/21 0500  Weight: 120.2 kg 125.1 kg 125.1 kg    Examination:  General exam: Appears calm and comfortable  Respiratory system: Clear to auscultation. Respiratory effort normal. Cardiovascular system: S1 & S2 heard, RRR. No JVD, murmurs, rubs, gallops or clicks. No pedal edema. Gastrointestinal system: Abdomen is nondistended, soft and nontender. No organomegaly or masses felt. Normal bowel sounds heard. Central nervous system: Alert and oriented. No focal neurological deficits. Extremities: Symmetric 5 x 5 power. Skin: No rashes, lesions or ulcers.  Psychiatry: Judgement and insight appear normal. Mood & affect appropriate.    Data Reviewed: I have personally reviewed following labs and imaging studies  CBC: Recent Labs  Lab 09/14/21 0449 09/15/21 0417 09/17/21 0657  WBC 9.7 10.0 5.7  NEUTROABS 6.9  --  2.7  HGB 13.4 12.3* 10.8*  HCT 38.2* 34.5* 30.5*  MCV 83.6 82.7 84.3  PLT 240 234 250    Basic Metabolic Panel: Recent Labs  Lab 09/14/21 2205 09/15/21 0417 09/15/21 1509 09/16/21 0558 09/17/21 0657 09/18/21 0206  NA 140 141 139 138 138 138  K 3.1* 3.2* 3.3* 3.2* 3.8 3.8  CL 108 110 109 107 109 110  CO2 24 21* 20* 22 22 21*  GLUCOSE 110* 141* 109* 115* 115* 99  BUN 20 21* 22* 24* 23* 23*  CREATININE 3.19* 3.55* 3.85* 4.05* 3.72* 3.35*  CALCIUM 8.5* 8.7* 8.8* 8.8* 8.2* 8.3*  MG 1.8 1.7   --  2.2  --   --     GFR: Estimated Creatinine Clearance: 33.3 mL/min (A) (by C-G formula based on SCr of 3.35 mg/dL (H)). Liver Function Tests: Recent Labs  Lab 09/14/21 0449 09/15/21 0417 09/17/21 0657  AST 19 20 11*  ALT 15 12 11   ALKPHOS 200* 140* 112  BILITOT 1.4* 1.4* 0.9  PROT 7.8 6.3* 5.5*  ALBUMIN 3.8 2.9* 2.5*    No results for input(s): LIPASE, AMYLASE in the last 168 hours. Recent Labs  Lab 09/14/21 0548  AMMONIA 25    Coagulation Profile: No results for input(s): INR, PROTIME in the last 168 hours. Cardiac Enzymes: No results for input(s): CKTOTAL, CKMB, CKMBINDEX, TROPONINI in the last 168 hours. BNP (last 3 results) No results for input(s): PROBNP in the  last 8760 hours. HbA1C: No results for input(s): HGBA1C in the last 72 hours.  CBG: Recent Labs  Lab 09/17/21 1520 09/17/21 1930 09/17/21 2327 09/18/21 0345 09/18/21 0730  GLUCAP 157* 173* 84 102* 109*    Lipid Profile: No results for input(s): CHOL, HDL, LDLCALC, TRIG, CHOLHDL, LDLDIRECT in the last 72 hours. Thyroid Function Tests: No results for input(s): TSH, T4TOTAL, FREET4, T3FREE, THYROIDAB in the last 72 hours. Anemia Panel: No results for input(s): VITAMINB12, FOLATE, FERRITIN, TIBC, IRON, RETICCTPCT in the last 72 hours. Sepsis Labs: No results for input(s): PROCALCITON, LATICACIDVEN in the last 168 hours.  Recent Results (from the past 240 hour(s))  Resp Panel by RT-PCR (Flu A&B, Covid) Nasopharyngeal Swab     Status: None   Collection Time: 09/14/21  7:04 AM   Specimen: Nasopharyngeal Swab; Nasopharyngeal(NP) swabs in vial transport medium  Result Value Ref Range Status   SARS Coronavirus 2 by RT PCR NEGATIVE NEGATIVE Final    Comment: (NOTE) SARS-CoV-2 target nucleic acids are NOT DETECTED.  The SARS-CoV-2 RNA is generally detectable in upper respiratory specimens during the acute phase of infection. The lowest concentration of SARS-CoV-2 viral copies this assay can detect  is 138 copies/mL. A negative result does not preclude SARS-Cov-2 infection and should not be used as the sole basis for treatment or other patient management decisions. A negative result may occur with  improper specimen collection/handling, submission of specimen other than nasopharyngeal swab, presence of viral mutation(s) within the areas targeted by this assay, and inadequate number of viral copies(<138 copies/mL). A negative result must be combined with clinical observations, patient history, and epidemiological information. The expected result is Negative.  Fact Sheet for Patients:  EntrepreneurPulse.com.au  Fact Sheet for Healthcare Providers:  IncredibleEmployment.be  This test is no t yet approved or cleared by the [place] FDA and  has been authorized for detection and/or diagnosis of SARS-CoV-2 by FDA under an Emergency Use Authorization (EUA). This EUA will remain  in effect (meaning this test can be used) for the duration of the COVID-19 declaration under Section 564(b)(1) of the Act, 21 U.S.C.section 360bbb-3(b)(1), unless the authorization is terminated  or revoked sooner.       Influenza A by PCR NEGATIVE NEGATIVE Final   Influenza B by PCR NEGATIVE NEGATIVE Final    Comment: (NOTE) The Xpert Xpress SARS-CoV-2/FLU/RSV plus assay is intended as an aid in the diagnosis of influenza from Nasopharyngeal swab specimens and should not be used as a sole basis for treatment. Nasal washings and aspirates are unacceptable for Xpert Xpress SARS-CoV-2/FLU/RSV testing.  Fact Sheet for Patients: EntrepreneurPulse.com.au  Fact Sheet for Healthcare Providers: IncredibleEmployment.be  This test is not yet approved or cleared by the [place] FDA and has been authorized for detection and/or diagnosis of SARS-CoV-2 by FDA under an Emergency Use Authorization (EUA). This EUA will remain in effect  (meaning this test can be used) for the duration of the COVID-19 declaration under Section 564(b)(1) of the Act, 21 U.S.C. section 360bbb-3(b)(1), unless the authorization is terminated or revoked.  Performed at [facility], [place] [address] [place], [place] [address]   MRSA Next Gen by PCR, Nasal     Status: None   Collection Time: 09/14/21 12:44 PM   Specimen: Nasal Mucosa; Nasal Swab  Result Value Ref Range Status   MRSA by PCR Next Gen NOT DETECTED NOT DETECTED Final    Comment: (NOTE) The GeneXpert MRSA Assay (FDA approved for NASAL specimens only), is  one component of a comprehensive MRSA colonization surveillance program. It is not intended to diagnose MRSA infection nor to guide or monitor treatment for MRSA infections. Test performance is not FDA approved in patients less than 28 years old. Performed at [facility], [place] [address], [place], [place] [address]        Radiology Studies: No results found.  Scheduled Meds:  amLODipine  10 mg Oral Daily   aspirin EC  81 mg Oral Daily   atorvastatin  40 mg Oral QHS   Chlorhexidine Gluconate Cloth  6 each Topical Daily   ezetimibe  10 mg Oral Daily   heparin  5,000 Units Subcutaneous Q8H   hydrALAZINE  25 mg Oral Q8H   insulin aspart  0-20 Units Subcutaneous Q4H   levETIRAcetam  500 mg Oral BID   pantoprazole  40 mg Oral QHS   QUEtiapine  25 mg Oral QHS   sertraline  25 mg Oral Daily   tamsulosin  0.4 mg Oral Daily   Continuous Infusions:  lactated ringers 100 mL/hr at 09/17/21 2300   lactated ringers       LOS: 4 days   Time spent: 28 minutes   Darliss Cheney, MD [facility]  09/18/2021, 10:46 AM  Please page via [facility] and do not message via secure chat for anything urgent. Secure chat can be used for anything non urgent.  How to contact the [facility] Attending or Consulting provider [place] or covering provider during after hours [place], for this patient?  Check the care team in [facility] and look  for a) attending/consulting [facility] provider listed and b) the [facility] team listed. Page or secure chat [phone]. Log into [email or web address] and use [place]'s universal password to access. If you do not have the password, please contact the hospital operator. Locate the [facility] provider you are looking for under [facility] and page to a number that you can be directly reached. If you still have difficulty reaching the provider, please page the [facility] (Director on Call) for the Hospitalists listed on amion for assistance.

## 2021-09-19 LAB — GLUCOSE, CAPILLARY
Glucose-Capillary: 106 mg/dL — ABNORMAL HIGH (ref 70–99)
Glucose-Capillary: 93 mg/dL (ref 70–99)
Glucose-Capillary: 168 mg/dL — ABNORMAL HIGH (ref 70–99)
Glucose-Capillary: 132 mg/dL — ABNORMAL HIGH (ref 70–99)
Glucose-Capillary: 193 mg/dL — ABNORMAL HIGH (ref 70–99)
Glucose-Capillary: 139 mg/dL — ABNORMAL HIGH (ref 70–99)

## 2021-09-19 LAB — BASIC METABOLIC PANEL
Anion gap: 7 (ref 5–15)
BUN: 22 mg/dL — ABNORMAL HIGH (ref 6–20)
CO2: 21 mmol/L — ABNORMAL LOW (ref 22–32)
Calcium: 8.3 mg/dL — ABNORMAL LOW (ref 8.9–10.3)
Chloride: 110 mmol/L (ref 98–111)
Creatinine, Ser: 2.95 mg/dL — ABNORMAL HIGH (ref 0.61–1.24)
GFR, Estimated: 24 mL/min — ABNORMAL LOW (ref >60)
Glucose, Bld: 84 mg/dL (ref 70–99)
Potassium: 4.1 mmol/L (ref 3.5–5.1)
Sodium: 138 mmol/L (ref 135–145)

## 2021-09-19 NOTE — Progress Notes (Signed)
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  History obtained per chart review and patient's wife due to his altered mental status.  His wife called EMS because of a high blood glucose at 3 AM this morning.  She gave him NovoLog 18 units prior to EMS arrival.  She reports he was confused and disoriented, acting abnormal at home.  Compliance with his medications has been an ongoing issue.  Wife reports that he is presented similarly in the setting of high blood pressure and high blood sugars in the past.  He was recently hospitalized with hypertensive encephalopathy in July 2022.    On EMS arrival his blood pressure was 220/120 and blood sugar was 469 on arrival to ED.  He was severely agitated in the ED requiring Geodon and subsequently Ativan and Benadryl to keep him in bed and obtain IV access.  Wife states that he started acting abnormal at home and suspects that he has not been taking his medications.  This has been an ongoing issue with multiple hospitalizations.    Assessment & Plan:   Active Problems:   Hypertensive emergency  Hypertensive emergency: UDS negative.  Patient was admitted by ICU team and started on Cardene drip initially.  Now blood pressure controlled on Norvasc and hydralazine.  Monitor.  Acute encephalopathy: Likely secondary to hypertensive emergency.  CT head negative for any acute pathology.  Today, patient is fully alert and oriented.  Delirium: Resolved.  Poorly controlled type 2 diabetes mellitus with hyperglycemia: Blood sugar very well controlled on SSI only.  Hemoglobin A1c 8.4.  AKI on CKD stage IIIb vs  ckd stage 4: From chart review, it appears that patient has been having varied levels of creatinine ranging anywhere from 1.9-2.7, fluctuating between CKD stage IIIb and CKD stage IV.  He came in with worsening renal function.  Which has further worsened  to 4.05 09/16/2021 and has a started to improve and currently 2.95.  Improving and better than yesterday but off of his baseline.  We will keep him with IV fluids and repeat labs in the morning.  History of seizure disorder: Continue Keppra.  Hypokalemia: Resolved  Hypomagnesemia: Resolved.    Elevated LFTs: Resolved.  Hyperlipidemia: Continue statin.  Dysphagia: S/p MBS and SLP following, currently on dysphagia 3 diet.  Noncompliance: It appears that patient has had several admissions due to hypertensive urgency and acute encephalopathy likely due to noncompliance.  He has also missed numerous PCP appointments.  DVT prophylaxis: heparin injection 5,000 Units Start: 09/14/21 1400   Code Status: Full Code  Family Communication:  None present at bedside.   Status is: Inpatient  Remains inpatient appropriate because:Inpatient level of care appropriate due to severity of illness  Dispo: The patient is from: Home              Anticipated d/c is to: SNF              Patient currently is not medically stable to d/c.   Difficult to place patient No  Estimated body mass index is 36.39 kg/m as calculated from the following:   Height as of 07/05/21: 6\' 1"  (1.854 m).   Weight as of this encounter:  125.1 kg.  Nutritional Assessment: Body mass index is 36.39 kg/m.[name] Seen by dietician.  I agree with the assessment and plan as outlined below: Nutrition Status:   Skin Assessment: I have examined the patient's skin and I agree with the wound assessment as performed by the wound care RN as outlined below:    Consultants:  None  Procedures:  None  Antimicrobials:  Anti-infectives (From admission, onward)    None           Subjective: Seen and examined.  Remains fully alert and oriented and asymptomatic and eager to go home.  Objective: Vitals:   09/19/21 1000 09/19/21 1100 09/19/21 1103 09/19/21 1121  BP: (!) 162/101 (!) 175/106 (!) 156/97   Pulse: 97 (!) 101 95   Resp: 15 13 18    Temp:    98.1 F (36.7 C)  TempSrc:    Oral  SpO2: 97% 96% 97%   Weight:        Intake/Output Summary (Last 24 hours) at 09/19/2021 1211 Last data filed at 09/19/2021 1000 Gross per 24 hour  Intake 3012.38 ml  Output 1450 ml  Net 1562.38 ml    Filed Weights   09/16/21 0500 09/17/21 0500 09/18/21 0500  Weight: 120.2 kg 125.1 kg 125.1 kg    Examination:  General exam: Appears calm and comfortable  Respiratory system: Clear to auscultation. Respiratory effort normal. Cardiovascular system: S1 & S2 heard, RRR. No JVD, murmurs, rubs, gallops or clicks. No pedal edema. Gastrointestinal system: Abdomen is nondistended, soft and nontender. No organomegaly or masses felt. Normal bowel sounds heard. Central nervous system: Alert and oriented. No focal neurological deficits. Extremities: Symmetric 5 x 5 power. Skin: No rashes, lesions or ulcers.  Psychiatry: Judgement and insight appear poor  Data Reviewed: I have personally reviewed following labs and imaging studies  CBC: Recent Labs  Lab 09/14/21 0449 09/15/21 0417 09/17/21 0657  WBC 9.7 10.0 5.7  NEUTROABS 6.9  --  2.7  HGB 13.4 12.3* 10.8*  HCT 38.2* 34.5* 30.5*  MCV 83.6 82.7 84.3  PLT 240 234 315    Basic Metabolic Panel: Recent Labs  Lab 09/14/21 2205 09/15/21 0417 09/15/21 1509 09/16/21 0558 09/17/21 0657 09/18/21 0206 09/19/21 0702  NA 140 141 139 138 138 138 138  K 3.1* 3.2* 3.3* 3.2* 3.8 3.8 4.1  CL 108 110 109 107 109 110 110  CO2 24 21* 20* 22 22 21* 21*  GLUCOSE 110* 141* 109* 115* 115* 99 84  BUN 20 21* 22* 24* 23* 23* 22*  CREATININE 3.19* 3.55* 3.85* 4.05* 3.72* 3.35* 2.95*  CALCIUM 8.5* 8.7* 8.8* 8.8* 8.2* 8.3* 8.3*   MG 1.8 1.7  --  2.2  --   --   --     GFR: Estimated Creatinine Clearance: 37.8 mL/min (A) (by C-G formula based on SCr of 2.95 mg/dL (H)). Liver Function Tests: Recent Labs  Lab 09/14/21 0449 09/15/21 0417 09/17/21 0657  AST 19 20 11*  ALT 15 12 11   ALKPHOS 200* 140* 112  BILITOT 1.4* 1.4* 0.9  PROT 7.8 6.3* 5.5*  ALBUMIN 3.8 2.9* 2.5*    No results for input(s): LIPASE, AMYLASE in the last 168 hours. Recent Labs  Lab 09/14/21 0548  AMMONIA 25    Coagulation Profile: No results for input(s): INR, PROTIME in the last 168 hours. Cardiac Enzymes: No results for input(s): CKTOTAL, CKMB, CKMBINDEX, TROPONINI in the last 168 hours. BNP (last 3 results) No results for input(s): PROBNP in the last  8760 hours. HbA1C: No results for input(s): HGBA1C in the last 72 hours.  CBG: Recent Labs  Lab 09/18/21 1932 09/18/21 2318 09/19/21 0317 09/19/21 0734 09/19/21 1121  GLUCAP 198* 137* 106* 93 168*    Lipid Profile: No results for input(s): CHOL, HDL, LDLCALC, TRIG, CHOLHDL, LDLDIRECT in the last 72 hours. Thyroid Function Tests: No results for input(s): TSH, T4TOTAL, FREET4, T3FREE, THYROIDAB in the last 72 hours. Anemia Panel: No results for input(s): VITAMINB12, FOLATE, FERRITIN, TIBC, IRON, RETICCTPCT in the last 72 hours. Sepsis Labs: No results for input(s): PROCALCITON, LATICACIDVEN in the last 168 hours.  Recent Results (from the past 240 hour(s))  Resp Panel by RT-PCR (Flu A&B, Covid) Nasopharyngeal Swab     Status: None   Collection Time: 09/14/21  7:04 AM   Specimen: Nasopharyngeal Swab; Nasopharyngeal(NP) swabs in vial transport medium  Result Value Ref Range Status   SARS Coronavirus 2 by RT PCR NEGATIVE NEGATIVE Final    Comment: (NOTE) SARS-CoV-2 target nucleic acids are NOT DETECTED.  The SARS-CoV-2 RNA is generally detectable in upper respiratory specimens during the acute phase of infection. The lowest concentration of SARS-CoV-2 viral copies  this assay can detect is 138 copies/mL. A negative result does not preclude SARS-Cov-2 infection and should not be used as the sole basis for treatment or other patient management decisions. A negative result may occur with  improper specimen collection/handling, submission of specimen other than nasopharyngeal swab, presence of viral mutation(s) within the areas targeted by this assay, and inadequate number of viral copies(<138 copies/mL). A negative result must be combined with clinical observations, patient history, and epidemiological information. The expected result is Negative.  Fact Sheet for Patients:  EntrepreneurPulse.com.au  Fact Sheet for Healthcare Providers:  IncredibleEmployment.be  This test is no t yet approved or cleared by the [place] FDA and  has been authorized for detection and/or diagnosis of SARS-CoV-2 by FDA under an Emergency Use Authorization (EUA). This EUA will remain  in effect (meaning this test can be used) for the duration of the COVID-19 declaration under Section 564(b)(1) of the Act, 21 U.S.C.section 360bbb-3(b)(1), unless the authorization is terminated  or revoked sooner.       Influenza A by PCR NEGATIVE NEGATIVE Final   Influenza B by PCR NEGATIVE NEGATIVE Final    Comment: (NOTE) The Xpert Xpress SARS-CoV-2/FLU/RSV plus assay is intended as an aid in the diagnosis of influenza from Nasopharyngeal swab specimens and should not be used as a sole basis for treatment. Nasal washings and aspirates are unacceptable for Xpert Xpress SARS-CoV-2/FLU/RSV testing.  Fact Sheet for Patients: EntrepreneurPulse.com.au  Fact Sheet for Healthcare Providers: IncredibleEmployment.be  This test is not yet approved or cleared by the [place] FDA and has been authorized for detection and/or diagnosis of SARS-CoV-2 by FDA under an Emergency Use Authorization (EUA). This EUA  will remain in effect (meaning this test can be used) for the duration of the COVID-19 declaration under Section 564(b)(1) of the Act, 21 U.S.C. section 360bbb-3(b)(1), unless the authorization is terminated or revoked.  Performed at [facility], [place] [address], [place], [place] [address]   MRSA Next Gen by PCR, Nasal     Status: None   Collection Time: 09/14/21 12:44 PM   Specimen: Nasal Mucosa; Nasal Swab  Result Value Ref Range Status   MRSA by PCR Next Gen NOT DETECTED NOT DETECTED Final    Comment: (NOTE) The GeneXpert MRSA Assay (FDA approved for NASAL specimens only), is one  component of a comprehensive MRSA colonization surveillance program. It is not intended to diagnose MRSA infection nor to guide or monitor treatment for MRSA infections. Test performance is not FDA approved in patients less than 13 years old. Performed at [facility], [place] [address] [place], [place] [address]        Radiology Studies: No results found.  Scheduled Meds:  amLODipine  10 mg Oral Daily   aspirin EC  81 mg Oral Daily   atorvastatin  40 mg Oral QHS   Chlorhexidine Gluconate Cloth  6 each Topical Daily   ezetimibe  10 mg Oral Daily   heparin  5,000 Units Subcutaneous Q8H   hydrALAZINE  25 mg Oral Q8H   insulin aspart  0-20 Units Subcutaneous Q4H   levETIRAcetam  500 mg Oral BID   pantoprazole  40 mg Oral QHS   QUEtiapine  25 mg Oral QHS   sertraline  25 mg Oral Daily   tamsulosin  0.4 mg Oral Daily   Continuous Infusions:  lactated ringers 100 mL/hr at 09/19/21 1000     LOS: 5 days   Time spent: 26 minutes   Darliss Cheney, MD [facility]  09/19/2021, 12:11 PM  Please page via Shea Evans and do not message via secure chat for anything urgent. Secure chat can be used for anything non urgent.  How to contact the [facility] Attending or Consulting provider [place] or covering provider during after hours [place], for this patient?  Check the care team in [facility] and  look for a) attending/consulting TR[facility] provider listed and b) the [facility] team listed. Page or secure chat [phone]. Log into [email or web address] and use [place] universal password to access. If you do not have the password, please contact the hospital operator. Locate the [facility] provider you are looking for under [facility] and page to a number that you can be directly reached. If you still have difficulty reaching the provider, please page the [facility] (Director on Call) for the Hospitalists listed on amion for assistance.

## 2021-09-20 LAB — BASIC METABOLIC PANEL
Anion gap: 8 (ref 5–15)
BUN: 24 mg/dL — ABNORMAL HIGH (ref 6–20)
CO2: 20 mmol/L — ABNORMAL LOW (ref 22–32)
Calcium: 8.5 mg/dL — ABNORMAL LOW (ref 8.9–10.3)
Chloride: 109 mmol/L (ref 98–111)
Creatinine, Ser: 2.99 mg/dL — ABNORMAL HIGH (ref 0.61–1.24)
GFR, Estimated: 23 mL/min — ABNORMAL LOW (ref >60)
Glucose, Bld: 115 mg/dL — ABNORMAL HIGH (ref 70–99)
Potassium: 4.4 mmol/L (ref 3.5–5.1)
Sodium: 137 mmol/L (ref 135–145)

## 2021-09-20 LAB — GLUCOSE, CAPILLARY
Glucose-Capillary: 123 mg/dL — ABNORMAL HIGH (ref 70–99)
Glucose-Capillary: 78 mg/dL (ref 70–99)
Glucose-Capillary: 148 mg/dL — ABNORMAL HIGH (ref 70–99)
Glucose-Capillary: 191 mg/dL — ABNORMAL HIGH (ref 70–99)
Glucose-Capillary: 195 mg/dL — ABNORMAL HIGH (ref 70–99)
Glucose-Capillary: 241 mg/dL — ABNORMAL HIGH (ref 70–99)
Glucose-Capillary: 229 mg/dL — ABNORMAL HIGH (ref 70–99)

## 2021-09-20 MED ORDER — HYDRALAZINE HCL 50 MG PO TABS
100.0000 mg | ORAL_TABLET | Freq: Three times a day (TID) | ORAL | Status: DC
  Administered 2021-09-20 – 2021-09-21 (×3): 100 mg via ORAL
  Filled 2021-09-20 (×3): qty 2

## 2021-09-20 MED ORDER — HYDRALAZINE HCL 50 MG PO TABS
50.0000 mg | ORAL_TABLET | Freq: Three times a day (TID) | ORAL | Status: DC
  Administered 2021-09-20: 50 mg via ORAL

## 2021-09-20 NOTE — Progress Notes (Signed)
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  History obtained per chart review and patient's wife due to his altered mental status.  His wife called EMS because of a high blood glucose at 3 AM this morning.  She gave him NovoLog 18 units prior to EMS arrival.  She reports he was confused and disoriented, acting abnormal at home.  Compliance with his medications has been an ongoing issue.  Wife reports that he is presented similarly in the setting of high blood pressure and high blood sugars in the past.  He was recently hospitalized with hypertensive encephalopathy in July 2022.    On EMS arrival his blood pressure was 220/120 and blood sugar was 469 on arrival to ED.  He was severely agitated in the ED requiring Geodon and subsequently Ativan and Benadryl to keep him in bed and obtain IV access.  Wife states that he started acting abnormal at home and suspects that he has not been taking his medications.  This has been an ongoing issue with multiple hospitalizations.    Assessment & Plan:   Active Problems:   Hypertensive emergency  Hypertensive emergency: UDS negative.  Patient was admitted by ICU team and started on Cardene drip initially.  Blood pressure now slightly elevated.  Will increase hydralazine to 100 mg 3 times daily and continue current amlodipine 10 mg.  Acute encephalopathy: Likely secondary to hypertensive emergency.  CT head negative for any acute pathology.  Today, patient is fully alert and oriented.  Delirium: Resolved.  Poorly controlled type 2 diabetes mellitus with hyperglycemia: Blood sugar very well controlled on  SSI only.  Hemoglobin A1c 8.4.  AKI on CKD stage IIIb vs ckd stage 4: From chart review, it appears that patient has been having varied levels of creatinine ranging anywhere from 1.9-2.7, fluctuating between CKD stage IIIb and CKD stage IV.  He came in with worsening renal function.  Which has further worsened  to 4.05 09/16/2021 and then improved.  Currently hovering around 2.9.  Slightly worse compared to yesterday.  Will discuss with nephrology.  Now has +2 pitting edema bilateral lower extremity.  We will discontinue IV fluids.  Urine output remains low.  History of seizure disorder: Continue Keppra.  Hypokalemia: Resolved  Hypomagnesemia: Resolved.    Elevated LFTs: Resolved.  Hyperlipidemia: Continue statin.  Dysphagia: S/p MBS and SLP following, currently on dysphagia 3 diet.  Noncompliance: It appears that patient has had several admissions due to hypertensive urgency and acute encephalopathy likely due to noncompliance.  He has also missed numerous PCP appointments.  DVT prophylaxis: heparin injection 5,000 Units Start: 09/14/21 1400   Code Status: Full Code  Family Communication:  None present at bedside.   Status is: Inpatient  Remains inpatient appropriate because:Inpatient level of care appropriate due to severity of illness  Dispo: The patient is from: Home              Anticipated d/c is to: SNF              Patient currently is not medically stable to d/c.   Difficult to place patient No  Estimated body mass index is 36.39 kg/m as calculated from the following:  Height as of 07/05/21: 6\' 1"  (1.854 m).   Weight as of this encounter: 125.1 kg.  Nutritional Assessment: Body mass index is 36.39 kg/m.[name] Seen by dietician.  I agree with the assessment and plan as outlined below: Nutrition Status:   Skin Assessment: I have examined the patient's skin and I agree with the wound assessment as performed by the wound care RN as outlined below:    Consultants:   None  Procedures:  None  Antimicrobials:  Anti-infectives (From admission, onward)    None          Subjective: Patient seen and examined.  Fully alert and oriented.  He has no complaints.  Very calm and pleasant.  Objective: Vitals:   09/20/21 0736 09/20/21 0749 09/20/21 0927 09/20/21 1000  BP:  (!) 156/91 (!) 151/94   Pulse:      Resp:   13 17  Temp: 97.6 F (36.4 C)     TempSrc: Oral     SpO2:  100%    Weight:        Intake/Output Summary (Last 24 hours) at 09/20/2021 1116 Last data filed at 09/20/2021 1000 Gross per 24 hour  Intake 2514.58 ml  Output 600 ml  Net 1914.58 ml    Filed Weights   09/16/21 0500 09/17/21 0500 09/18/21 0500  Weight: 120.2 kg 125.1 kg 125.1 kg    Examination:  General exam: Appears calm and comfortable  Respiratory system: Clear to auscultation. Respiratory effort normal. Cardiovascular system: S1 & S2 heard, RRR. No JVD, murmurs, rubs, gallops or clicks. No pedal edema. Gastrointestinal system: Abdomen is nondistended, soft and nontender. No organomegaly or masses felt. Normal bowel sounds heard. Central nervous system: Alert and oriented. No focal neurological deficits. Extremities: Symmetric 5 x 5 power. Skin: No rashes, lesions or ulcers.  Psychiatry: Judgement and insight appear poor  Data Reviewed: I have personally reviewed following labs and imaging studies  CBC: Recent Labs  Lab 09/14/21 0449 09/15/21 0417 09/17/21 0657  WBC 9.7 10.0 5.7  NEUTROABS 6.9  --  2.7  HGB 13.4 12.3* 10.8*  HCT 38.2* 34.5* 30.5*  MCV 83.6 82.7 84.3  PLT 240 234 419    Basic Metabolic Panel: Recent Labs  Lab 09/14/21 2205 09/15/21 0417 09/15/21 1509 09/16/21 0558 09/17/21 0657 09/18/21 0206 09/19/21 0702 09/20/21 0411  NA 140 141   < > 138 138 138 138 137  K 3.1* 3.2*   < > 3.2* 3.8 3.8 4.1 4.4  CL 108 110   < > 107 109 110 110 109  CO2 24 21*   < > 22 22 21* 21* 20*  GLUCOSE 110* 141*   < > 115* 115* 99 84 115*   BUN 20 21*   < > 24* 23* 23* 22* 24*  CREATININE 3.19* 3.55*   < > 4.05* 3.72* 3.35* 2.95* 2.99*  CALCIUM 8.5* 8.7*   < > 8.8* 8.2* 8.3* 8.3* 8.5*  MG 1.8 1.7  --  2.2  --   --   --   --    < > = values in this interval not displayed.    GFR: Estimated Creatinine Clearance: 37.3 mL/min (A) (by C-G formula based on SCr of 2.99 mg/dL (H)). Liver Function Tests: Recent Labs  Lab 09/14/21 0449 09/15/21 0417 09/17/21 0657  AST 19 20 11*  ALT 15 12 11   ALKPHOS 200* 140* 112  BILITOT 1.4* 1.4* 0.9  PROT 7.8 6.3* 5.5*  ALBUMIN 3.8 2.9* 2.5*  No results for input(s): LIPASE, AMYLASE in the last 168 hours. Recent Labs  Lab 09/14/21 0548  AMMONIA 25    Coagulation Profile: No results for input(s): INR, PROTIME in the last 168 hours. Cardiac Enzymes: No results for input(s): CKTOTAL, CKMB, CKMBINDEX, TROPONINI in the last 168 hours. BNP (last 3 results) No results for input(s): PROBNP in the last 8760 hours. HbA1C: No results for input(s): HGBA1C in the last 72 hours.  CBG: Recent Labs  Lab 09/19/21 1539 09/19/21 1917 09/19/21 2319 09/20/21 0316 09/20/21 0733  GLUCAP 132* 193* 139* 123* 78    Lipid Profile: No results for input(s): CHOL, HDL, LDLCALC, TRIG, CHOLHDL, LDLDIRECT in the last 72 hours. Thyroid Function Tests: No results for input(s): TSH, T4TOTAL, FREET4, T3FREE, THYROIDAB in the last 72 hours. Anemia Panel: No results for input(s): VITAMINB12, FOLATE, FERRITIN, TIBC, IRON, RETICCTPCT in the last 72 hours. Sepsis Labs: No results for input(s): PROCALCITON, LATICACIDVEN in the last 168 hours.  Recent Results (from the past 240 hour(s))  Resp Panel by RT-PCR (Flu A&B, Covid) Nasopharyngeal Swab     Status: None   Collection Time: 09/14/21  7:04 AM   Specimen: Nasopharyngeal Swab; Nasopharyngeal(NP) swabs in vial transport medium  Result Value Ref Range Status   SARS Coronavirus 2 by RT PCR NEGATIVE NEGATIVE Final    Comment: (NOTE) SARS-CoV-2 target  nucleic acids are NOT DETECTED.  The SARS-CoV-2 RNA is generally detectable in upper respiratory specimens during the acute phase of infection. The lowest concentration of SARS-CoV-2 viral copies this assay can detect is 138 copies/mL. A negative result does not preclude SARS-Cov-2 infection and should not be used as the sole basis for treatment or other patient management decisions. A negative result may occur with  improper specimen collection/handling, submission of specimen other than nasopharyngeal swab, presence of viral mutation(s) within the areas targeted by this assay, and inadequate number of viral copies(<138 copies/mL). A negative result must be combined with clinical observations, patient history, and epidemiological information. The expected result is Negative.  Fact Sheet for Patients:  EntrepreneurPulse.com.au  Fact Sheet for Healthcare Providers:  IncredibleEmployment.be  This test is no t yet approved or cleared by the [place] FDA and  has been authorized for detection and/or diagnosis of SARS-CoV-2 by FDA under an Emergency Use Authorization (EUA). This EUA will remain  in effect (meaning this test can be used) for the duration of the COVID-19 declaration under Section 564(b)(1) of the Act, 21 U.S.C.section 360bbb-3(b)(1), unless the authorization is terminated  or revoked sooner.       Influenza A by PCR NEGATIVE NEGATIVE Final   Influenza B by PCR NEGATIVE NEGATIVE Final    Comment: (NOTE) The Xpert Xpress SARS-CoV-2/FLU/RSV plus assay is intended as an aid in the diagnosis of influenza from Nasopharyngeal swab specimens and should not be used as a sole basis for treatment. Nasal washings and aspirates are unacceptable for Xpert Xpress SARS-CoV-2/FLU/RSV testing.  Fact Sheet for Patients: EntrepreneurPulse.com.au  Fact Sheet for Healthcare  Providers: IncredibleEmployment.be  This test is not yet approved or cleared by the [place] FDA and has been authorized for detection and/or diagnosis of SARS-CoV-2 by FDA under an Emergency Use Authorization (EUA). This EUA will remain in effect (meaning this test can be used) for the duration of the COVID-19 declaration under Section 564(b)(1) of the Act, 21 U.S.C. section 360bbb-3(b)(1), unless the authorization is terminated or revoked.  Performed at [facility], [place] [address] [place], [place] [address]  MRSA Next Gen by PCR, Nasal     Status: None   Collection Time: 09/14/21 12:44 PM   Specimen: Nasal Mucosa; Nasal Swab  Result Value Ref Range Status   MRSA by PCR Next Gen NOT DETECTED NOT DETECTED Final    Comment: (NOTE) The GeneXpert MRSA Assay (FDA approved for NASAL specimens only), is one component of a comprehensive MRSA colonization surveillance program. It is not intended to diagnose MRSA infection nor to guide or monitor treatment for MRSA infections. Test performance is not FDA approved in patients less than 42 years old. Performed at [facility], [place] [address] [place], [place] [address]        Radiology Studies: No results found.  Scheduled Meds:  amLODipine  10 mg Oral Daily   aspirin EC  81 mg Oral Daily   atorvastatin  40 mg Oral QHS   Chlorhexidine Gluconate Cloth  6 each Topical Daily   ezetimibe  10 mg Oral Daily   heparin  5,000 Units Subcutaneous Q8H   hydrALAZINE  50 mg Oral Q8H   insulin aspart  0-20 Units Subcutaneous Q4H   levETIRAcetam  500 mg Oral BID   pantoprazole  40 mg Oral QHS   QUEtiapine  25 mg Oral QHS   sertraline  25 mg Oral Daily   tamsulosin  0.4 mg Oral Daily   Continuous Infusions:     LOS: 6 days   Time spent: 25 minutes   Darliss Cheney, MD [facility]  09/20/2021, 11:16 AM  Please page via Shea [place] and do not message via secure chat for anything urgent. Secure  chat can be used for anything non urgent.  How to contact the [facility] Attending or Consulting provider [place] or covering provider during after hours [place], for this patient?  Check the care team in [facility] and look for a) attending/consulting TRH provider listed and b) the [facility] team listed. Page or secure chat [phone]. Log into [email or web address] and use [place]'s universal password to access. If you do not have the password, please contact the hospital operator. Locate the [facility] provider you are looking for under [facility] and page to a number that you can be directly reached. If you still have difficulty reaching the provider, please page the [facility] (Director on Call) for the Hospitalists listed on amion for assistance.

## 2021-09-20 NOTE — Progress Notes (Signed)
OT Cancellation Note  Patient Details Name: TAEVION SIKORA MRN: [id number] DOB: [birth year]   Cancelled Treatment:    Reason Eval/Treat Not Completed: Fatigue/lethargy limiting ability to participate- pt declined as reports he just laid back down.  Requests OT to check on him later today if able.   Jolaine Artist, OT Acute Rehabilitation Services Pager [phone] Office [phone]   Delight Stare 09/20/2021, 10:20 AM

## 2021-09-20 NOTE — Progress Notes (Signed)
Report called to [place] for [phone] and patient was transferred via wheelchair with his family

## 2021-09-21 DIAGNOSIS — Z794 Long term (current) use of insulin: Secondary | ICD-10-CM

## 2021-09-21 DIAGNOSIS — R410 Disorientation, unspecified: Secondary | ICD-10-CM

## 2021-09-21 DIAGNOSIS — E1142 Type 2 diabetes mellitus with diabetic polyneuropathy: Secondary | ICD-10-CM

## 2021-09-21 LAB — BASIC METABOLIC PANEL
Anion gap: 8 (ref 5–15)
BUN: 23 mg/dL — ABNORMAL HIGH (ref 6–20)
CO2: 21 mmol/L — ABNORMAL LOW (ref 22–32)
Calcium: 8.2 mg/dL — ABNORMAL LOW (ref 8.9–10.3)
Chloride: 110 mmol/L (ref 98–111)
Creatinine, Ser: 2.79 mg/dL — ABNORMAL HIGH (ref 0.61–1.24)
GFR, Estimated: 25 mL/min — ABNORMAL LOW (ref >60)
Glucose, Bld: 114 mg/dL — ABNORMAL HIGH (ref 70–99)
Potassium: 4.3 mmol/L (ref 3.5–5.1)
Sodium: 139 mmol/L (ref 135–145)

## 2021-09-21 LAB — GLUCOSE, CAPILLARY
Glucose-Capillary: 149 mg/dL — ABNORMAL HIGH (ref 70–99)
Glucose-Capillary: 89 mg/dL (ref 70–99)
Glucose-Capillary: 111 mg/dL — ABNORMAL HIGH (ref 70–99)

## 2021-09-21 MED ORDER — INSULIN ASPART 100 UNIT/ML IJ SOLN: 0.0000 [IU] | Freq: Three times a day (TID) | INTRAMUSCULAR | Status: DC

## 2021-09-21 MED ORDER — AMLODIPINE BESYLATE 10 MG PO TABS: 10.0000 mg | ORAL_TABLET | Freq: Every day | ORAL | 0 refills | Status: DC

## 2021-09-21 MED ORDER — HYDRALAZINE HCL 100 MG PO TABS: 100.0000 mg | ORAL_TABLET | Freq: Three times a day (TID) | ORAL | 0 refills | Status: DC

## 2021-09-21 MED ORDER — INSULIN GLARGINE 100 UNIT/ML SOLOSTAR PEN: 20.0000 [IU] | PEN_INJECTOR | Freq: Every day | SUBCUTANEOUS | 2 refills | Status: DC

## 2021-09-21 MED ORDER — INSULIN ASPART 100 UNIT/ML IJ SOLN: 0.0000 [IU] | Freq: Every day | INTRAMUSCULAR | Status: DC

## 2021-09-21 NOTE — TOC Initial Note (Addendum)
Transition of Care [facility]) - Initial/Assessment Note    Patient Details  Name: Brandon [name] MRN: [id number] Date of Birth: 05-01-1961  Transition of Care [facility]) CM/SW Contact:    Marilu Favre, RN Phone Number: 09/21/2021, 9:35 AM  Clinical Narrative:                 From chart review patient is active with [facility] and patient has walker and cane at home.   Called [place] [phone] no answer and voicemail box is full. Texted Lisa awaiting response.   Confirmed with Tommi Rumps with [facility] patient is active with them and they can continue to provide services. Cory aware patient being discharge today.   [place] to [place] at bedside. Confirmed [place] and he has a walker at home.   Expected Discharge Plan: [place]     Patient Goals and CMS Choice Patient states their goals for this hospitalization and ongoing recovery are:: to go home CMS Medicare.gov Compare Post Acute Care list provided to:: Patient Choice offered to / list presented to : Patient  Expected Discharge Plan and Services Expected Discharge Plan: [place]   Discharge Planning Services: CM Consult Post Acute Care Choice: [place] arrangements for the past 2 months: Apartment Expected Discharge Date: 09/21/21               DME Arranged: N/A         HH Arranged: PT, OT, Speech Therapy HH Agency: [place] Date [facility] Agency Contacted: 09/21/21 Time HH Agency Contacted: [phone] Representative spoke with at [place]: Tommi Rumps  Prior Living Arrangements/Services Living arrangements for the past 2 months: Apartment Lives with:: Significant Other Patient language and need for interpreter reviewed:: Yes Do you feel safe going back to the place where you live?: Yes      Need for Family Participation in Patient Care: Yes (Comment) Care giver support system in place?: Yes (comment) Current home services: DME Criminal Activity/Legal Involvement  Pertinent to Current Situation/Hospitalization: No - Comment as needed  Activities of Daily Living Home Assistive Devices/Equipment: Cane (specify quad or straight) ADL Screening (condition at time of admission) Patient's cognitive ability adequate to safely complete daily activities?: Yes Is the patient deaf or have difficulty hearing?: No Does the patient have difficulty seeing, even when wearing glasses/contacts?: No Does the patient have difficulty concentrating, remembering, or making decisions?: No Patient able to express need for assistance with ADLs?: Yes Does the patient have difficulty dressing or bathing?: Yes Independently performs ADLs?: No Communication: Independent Dressing (OT): Needs assistance Is this a change from baseline?: Change from baseline, expected to last <3days Grooming: Independent Feeding: Independent Bathing: Needs assistance Is this a change from baseline?: Change from baseline, expected to last <3 days Toileting: Independent In/Out Bed: Needs assistance Is this a change from baseline?: Pre-admission baseline Walks in Home: Needs assistance, Independent with device (comment) Is this a change from baseline?: Pre-admission baseline Does the patient have difficulty walking or climbing stairs?: Yes Weakness of Legs: Left Weakness of Arms/Hands: None  Permission Sought/Granted   Permission granted to share information with : Yes, Verbal Permission Granted  Share Information with NAME: Barbette Or [phone]  Permission granted to share info w AGENCY: [name]        Emotional Assessment              Admission diagnosis:  Encephalopathy acute [G93.40] Hyperglycemia [R73.9] Hypertensive  emergency [I16.1] Patient Active Problem List   Diagnosis Date Noted   Hypertensive emergency 09/14/2021   Testicular pain, right 09/18/2020   Nausea and vomiting 09/18/2020   Abdominal discomfort 09/18/2020   Hypoglycemia 09/18/2020   Polypharmacy  09/18/2020   Testicular pain, left 04/16/2020   Pelvic pain 04/16/2020   Anemia 04/16/2020   Anxiety 04/16/2020   Depressed mood 04/16/2020   Acute renal failure superimposed on stage 3a chronic kidney disease ([place]) 04/06/2020   Abnormal radiologic findings on diagnostic imaging of right testicle 04/06/2020   Type 2 diabetes mellitus with diabetic polyneuropathy, with long-term current use of insulin ([place]) 02/09/2020   Type 2 diabetes mellitus with stage 3a chronic kidney disease, with long-term current use of insulin ([place]) 02/09/2020   Family history of premature CAD 01/27/2020   Stage 3b chronic kidney disease ([place]) 01/09/2020   Incontinence of feces 01/09/2020   Chronic pain of left knee 01/09/2020   Left leg pain 01/09/2020   Fall 01/09/2020   History of diverticulitis 10/30/2019   Noncompliance 06/10/2019   History of renal cell cancer 04/24/2018   Erectile dysfunction 04/24/2018   Edema 03/15/2018   Seizure disorder ([place]) 03/01/2018   History of stroke 11/29/2017   Obstructive sleep apnea 11/29/2017   Uncontrolled diabetes mellitus 05/10/2017   Dyslipidemia associated with type 2 diabetes mellitus ([place]) 05/10/2017   Hypertension associated with diabetes ([place]) 05/10/2017   History of cerebrovascular accident (CVA) with residual deficit 05/10/2017   Diabetic ulcer of left foot associated with secondary diabetes mellitus ([place]) 04/21/2016   History of seizure 01/05/2016   Depression, recurrent ([place]) 09/21/2014   Benign hypertension with CKD (chronic kidney disease) stage III ([place]) 12/30/2011   Side pain 12/30/2011   PCP:  Carlena Hurl, PA-C Pharmacy:   [id number] - [place], [place] [address] Phone: [phone] Fax: [phone]     Social Determinants of Health (SDOH) Interventions    Readmission Risk Interventions Readmission Risk Prevention Plan 07/09/2021  Transportation Screening Complete  PCP or  Specialist Appt within 3-5 Days Complete  HRI or [place] Complete  Social Work Consult for [place] Planning/Counseling Complete  Palliative Care Screening Not Applicable  Some recent data might be hidden

## 2021-09-21 NOTE — Care Management Important Message (Signed)
Important Message  Patient Details  Name: Brandon [name] MRN: [id number] Date of Birth: 1961/01/31   Medicare Important Message Given:  Yes Patient left prior to IM delivery will mailed signed copy to the patient home address.     Brandon [name] 09/21/2021, 12:18 PM

## 2021-09-21 NOTE — Discharge Summary (Signed)
Physician Discharge Summary  Brandon [name] GQQ:[id number] DOB: 1961-08-18 DOA: 09/14/2021  PCP: Carlena Hurl, PA-C  Admit date: 09/14/2021 Discharge date: 09/21/2021 30 Day Unplanned Readmission Risk Score    Flowsheet Row ED to Hosp-Admission (Current) from 09/14/2021 in [place]  30 Day Unplanned Readmission Risk Score (%) 25.63 Filed at 09/21/2021 0801       This score is the patient's risk of an unplanned readmission within 30 days of being discharged (0 -100%). The score is based on dignosis, age, lab data, medications, orders, and past utilization.   Low:  0-14.9   Medium: 15-21.9   High: 22-29.9   Extreme: 30 and above          Admitted From: Home Disposition: Home  Recommendations for Outpatient Follow-up:  Follow up with PCP in 1-2 weeks Please obtain BMP/CBC in one week Please follow up with your PCP on the following pending results: Unresulted Labs (From admission, onward)    None         Home Health: Yes Equipment/Devices: None  Discharge Condition: Stable CODE STATUS: Full code Diet recommendation: Renal and diabetic and low-sodium  Subjective: Seen and examined.  He has no complaints.  Remains fully alert and oriented.  Ready to go home today.  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 His wife called EMS because of a high blood glucose at 3 AM this morning.  She gave him NovoLog 18 units prior to EMS arrival.  According to her, he was confused and disoriented, acting abnormal at home.  Compliance with his medications has been an ongoing issue.  Wife reports that he is presented similarly in the setting of high blood pressure and high blood sugars in the past.  He  was recently hospitalized with hypertensive encephalopathy in July 2022.    On EMS arrival his blood pressure was 220/120 and blood sugar was 469 on arrival to ED.  He was severely agitated in the ED requiring Geodon and subsequently Ativan and Benadryl to keep him in bed and obtain IV access.  Patient was admitted in ICU under PCCM and was started on Cardene drip initially.  Blood pressure improved and subsequently was placed on oral amlodipine and hydralazine and was transferred to medical floor under hospitalist service.  His blood pressure has remained stable on hydralazine 100 mg 3 times daily and amlodipine 10 mg p.o. daily.   Acute encephalopathy: Likely secondary to hypertensive emergency.  CT head negative for any acute pathology.  Patient has remained alert and oriented since last 3 to 4 days now.   Delirium: Resolved.   Poorly controlled type 2 diabetes mellitus with hyperglycemia: Hemoglobin A1c 8.4.  Interestingly, his blood sugar has remained fairly controlled only on sliding scale insulin.  I suspect this is because he is getting carb modified diet here and he remains noncompliant with his diet at home.  For this reason, I am reducing his Lantus from 40 units that he was supposed to take previously to 20 units in order to prevent another hyperglycemia or DKA episode.  He has been counseled extensively to remain compliant with his diet and insulin regimen in order to prevent hospitalization.  He verbalized understanding.   AKI on CKD stage IIIb vs ckd stage 4: From chart review, it  appears that patient has been having varied levels of creatinine ranging anywhere from 1.9-2.7, fluctuating between CKD stage IIIb and CKD stage IV.  He came in with worsening renal function.  Which has further worsened  to 4.05 09/16/2021 and then improved.  Currently hovering around 2.8.  Very slowly improving and not far from his baseline.  I discussed with Dr. Theda Sers of nephrology who mentioned that he will  make sure that nephrology follow-up is arranged in the near future.   History of seizure disorder: Continue Keppra.   Hypokalemia: Resolved   Hypomagnesemia: Resolved.     Elevated LFTs: Resolved.   Hyperlipidemia: Continue statin.   Dysphagia: S/p MBS and SLP following, currently on dysphagia 3 diet.   Noncompliance: Counseling provided in length.    Discharge Diagnoses:  Active Problems:   Type 2 diabetes mellitus with diabetic polyneuropathy, with long-term current use of insulin (HCC)   Acute renal failure superimposed on stage 3a chronic kidney disease ([place])   Acute metabolic encephalopathy   Hypertensive emergency   Delirium    Discharge Instructions   Allergies as of 09/21/2021       Reactions   Morphine Itching        Medication List     TAKE these medications    ACCU-CHEK ACTIVE STRIPS test strip Generic drug: glucose blood Use as instructed What changed:  how much to take how to take this when to take this additional instructions   glucose blood test strip accu chek active 1-2 times daily What changed:  how much to take how to take this when to take this   accu-chek soft touch lancets Use as instructed What changed:  how much to take how to take this when to take this additional instructions   amLODipine 10 MG tablet Commonly known as: NORVASC Take 1 tablet (10 mg total) by mouth daily. Start taking on: September 22, 2021   aspirin EC 81 MG tablet Take 1 tablet (81 mg total) by mouth daily.   atorvastatin 40 MG tablet Commonly known as: LIPITOR Take 1 tablet (40 mg total) by mouth daily.   blood glucose meter kit and supplies Dispense based on patient and insurance preference. Use up to four times daily as directed. (FOR ICD-9 250.00, 250.01). What changed:  how much to take how to take this when to take this   ezetimibe 10 MG tablet Commonly known as: ZETIA Take 1 tablet (10 mg total) by mouth daily.   hydrALAZINE 100  MG tablet Commonly known as: APRESOLINE Take 1 tablet (100 mg total) by mouth every 8 (eight) hours. What changed:  medication strength how much to take when to take this   insulin aspart 100 UNIT/ML FlexPen Commonly known as: NOVOLOG Inject 2-10 Units into the skin 3 (three) times daily with meals. If glucose 150-200 take 2 units, glucose 201-250 take 4 units, 251-300 take 6 units, glucose 301-350 take 8 units, glucose 351-400 take 10 units   insulin glargine 100 UNIT/ML Solostar Pen Commonly known as: LANTUS Inject 20 Units into the skin daily. What changed: how much to take   levETIRAcetam 500 MG tablet Commonly known as: KEPPRA Take 2 tablets (1,000 mg total) by mouth daily.   NovoFine Plus Pen Needle 32G X 4 MM Misc Generic drug: Insulin Pen Needle Use as directed with insulin pen   omeprazole 40 MG capsule Commonly known as: PRILOSEC Take 1 capsule (40 mg total) by mouth daily. What changed:  when to take this  reasons to take this   QUEtiapine 25 MG tablet Commonly known as: SEROQUEL Take 1 tablet (25 mg total) by mouth at bedtime.   sertraline 25 MG tablet Commonly known as: ZOLOFT Take 1 tablet (25 mg total) by mouth daily.   tamsulosin 0.4 MG Caps capsule Commonly known as: FLOMAX Take 1 capsule (0.4 mg total) by mouth daily.   UNABLE TO FIND Med Name:  Custom diabetic shoes  Dx: D32.67               Durable Medical Equipment  (From admission, onward)           Start     Ordered   09/18/21 0948  For home use only DME Walker rolling  Once       Question Answer Comment  Walker: With 5 Inch Wheels   Patient needs a walker to treat with the following condition Balance problem      09/18/21 0948            Follow-up Information     [facility] Follow up.   Specialty: Home Health Services Contact information: [address] [place] [address] [phone]         Carlena Hurl, PA-C Follow up in  1 week(s).   Specialty: Family Medicine Contact information: [address] [place] [address] [phone]                Allergies  Allergen Reactions   Morphine Itching    Consultations: Nephrology curb sided.  Admitted under PCCM.   Procedures/Studies: DG Abd 1 View  Result Date: 09/14/2021 CLINICAL DATA:  Impaired enteric catheter feeding EXAM: ABDOMEN - 1 VIEW COMPARISON:  None. FINDINGS: Frontal view of the lower chest and upper abdomen demonstrates enteric catheter tip projecting over the gastric fundus. The side port projects at the gastroesophageal junction. Bowel gas pattern is unremarkable. Lung bases are clear. IMPRESSION: 1. Enteric catheter tip projecting over the gastric fundus. Side port projects at the gastroesophageal junction. Electronically Signed   By: Randa Ngo M.D.   On: 09/14/2021 15:28   CT HEAD WO CONTRAST (5MM)  Result Date: 09/14/2021 CLINICAL DATA:  Mental status change with unknown cause EXAM: CT HEAD WITHOUT CONTRAST TECHNIQUE: Contiguous axial images were obtained from the base of the skull through the vertex without intravenous contrast. COMPARISON:  07/04/2021 FINDINGS: Brain: No evidence of acute infarction, hemorrhage, hydrocephalus, extra-axial collection or mass lesion/mass effect. Large remote inferior division right MCA territory infarct. Vascular: No hyperdense vessel or unexpected calcification. Skull: Normal. Negative for fracture or focal lesion. Sinuses/Orbits: No acute finding. IMPRESSION: 1. No acute finding. 2. Remote right inferior division MCA territory infarct. Electronically Signed   By: Jorje Guild M.D.   On: 09/14/2021 08:33   US RENAL  Result Date: 09/16/2021 CLINICAL DATA:  Acute kidney injury. EXAM: RENAL / URINARY TRACT ULTRASOUND COMPLETE COMPARISON:  Ultrasound 07/07/2021.  CT 04/05/2020. FINDINGS: Right Kidney: Renal measurements: None 0.9 x 5.2 x 4.4 cm = volume: 120 mL. Echogenicity within normal  limits. No mass or hydronephrosis visualized. Left Kidney: Renal measurements: 10.5 x 7.1 x 5.4 cm = volume: 212 mL. Calcification demonstrated in the left kidney measuring 2.5 x 0.9 by 1.6 cm, corresponding to calcified lesion on previous CT. No parenchymal thinning or hydronephrosis. Bladder: Appears normal for degree of bladder distention. Other: None. IMPRESSION: 1. Normal parenchymal thickness and echotexture bilaterally. No hydronephrosis. 2. Calcification in the left kidney corresponding to calcified  lesion on prior CT. Calcified lesion seen in the right kidney on prior CT is not visualized sonographically today. These areas correspond to previous cryoablation. Electronically Signed   By: Lucienne Capers M.D.   On: 09/16/2021 01:26   DG Swallowing Func-Speech Pathology  Result Date: 09/15/2021 Table formatting from the original result was not included. 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 and right; cryoablation bil  CVA (cerebral vascular accident) ([place])   stroke  Depression   BH Adm in [place] Depression  Diabetes mellitus   DKA prior hospitalization  Diverticulitis   s/p micorperforation Sept 2012-managed conservatively by Gen surgery  ED (erectile dysfunction)   Focal seizure ([place]) 11/2013  due to ischemic stroke  GERD (gastroesophageal reflux disease)   Hiatal hernia   Hyperlipidemia   Hypertension   Kidney tumor 09/2011  Renal cell CA  Seizures ([place])   none since 2016, taking Keppra - maw   Sleep apnea   Thyroid disease   "weak thyroid" per MD  Wears glasses  Past Surgical History: Past Surgical History: Procedure Laterality Date  COLONOSCOPY    IR RADIOLOGIST EVAL & MGMT  04/04/2017  KIDNEY SURGERY    ablation of renal cell CA - 12/28, prior one was October [place]  TEE WITHOUT CARDIOVERSION N/A 11/19/2013  Procedure: TRANSESOPHAGEAL ECHOCARDIOGRAM (TEE);  Surgeon: Lelon Perla, MD;  Location:  [facility];  Service: Cardiovascular;  Laterality: N/A; HPI: Pt is a 60 year old male who presented with altered mental status, hyperglycemia and hypertensive emergency. Pt reported choking sensation when he eats. NGT was placed with plan for swallow eval. 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 Pt was referred for an MBS in October, 2021, but it was not completed.  No data recorded Assessment / Plan / Recommendation CHL IP CLINICAL IMPRESSIONS 09/15/2021 Clinical Impression Pt presents with mild pharyngeal dysphagia characterized by reduced lingual retraction, reduced hyolaryngeal elevation and reduced anterior laryngeal movement. He demonstrated trace vallecular residue and inconsistent penetration (PAS 3) of thin liquids. However, bolus sizes were large and penetration was eliminated with cued smaller sips. vallecular residue was reduced with a liquid wash. A dysphagia 3 diet with thin liquids is recommended at this time. SLP will follow briefly to reinforce swallowing precautions and to ensure tolerance. SLP Visit Diagnosis Dysphagia, unspecified (R13.10) Attention and concentration deficit following -- Frontal lobe and executive function deficit following -- Impact on safety and function Mild aspiration risk   CHL IP TREATMENT RECOMMENDATION 09/15/2021 Treatment Recommendations Therapy as outlined in treatment plan below   Prognosis 09/15/2021 Prognosis for Safe Diet Advancement Good Barriers  to Reach Goals -- Barriers/Prognosis Comment -- CHL IP DIET RECOMMENDATION 09/15/2021 SLP Diet Recommendations Dysphagia 3 (Mech soft) solids;Thin liquid Liquid Administration via Straw;Cup Medication Administration Whole meds with liquid Compensations Small sips/bites;Slow rate Postural Changes Seated upright at 90 degrees   CHL IP OTHER RECOMMENDATIONS 11/17/2013 Recommended Consults -- Oral Care Recommendations -- Other Recommendations Clarify dietary  restrictions   CHL IP FOLLOW UP RECOMMENDATIONS 09/15/2021 Follow up Recommendations None   CHL IP FREQUENCY AND DURATION 09/15/2021 Speech Therapy Frequency (ACUTE ONLY) min 2x/week Treatment Duration 1 week      CHL IP ORAL PHASE 09/15/2021 Oral Phase WFL Oral - Pudding Teaspoon -- Oral - Pudding Cup -- Oral - Honey Teaspoon -- Oral - Honey Cup -- Oral - Nectar Teaspoon -- Oral - Nectar Cup -- Oral - Nectar Straw -- Oral - Thin Teaspoon -- Oral - Thin Cup -- Oral - Thin Straw -- Oral - Puree -- Oral - Mech Soft -- Oral - Regular -- Oral - Multi-Consistency -- Oral - Pill -- Oral Phase - Comment --  CHL IP PHARYNGEAL PHASE 09/15/2021 Pharyngeal Phase Impaired Pharyngeal- Pudding Teaspoon -- Pharyngeal -- Pharyngeal- Pudding Cup -- Pharyngeal -- Pharyngeal- Honey Teaspoon -- Pharyngeal -- Pharyngeal- Honey Cup -- Pharyngeal -- Pharyngeal- Nectar Teaspoon -- Pharyngeal -- Pharyngeal- Nectar Cup -- Pharyngeal -- Pharyngeal- Nectar Straw Reduced tongue base retraction;Pharyngeal residue - valleculae;Reduced laryngeal elevation;Reduced anterior laryngeal mobility Pharyngeal -- Pharyngeal- Thin Teaspoon -- Pharyngeal -- Pharyngeal- Thin Cup Reduced tongue base retraction;Pharyngeal residue - valleculae;Reduced laryngeal elevation;Reduced anterior laryngeal mobility;Penetration/Aspiration during swallow Pharyngeal Material enters airway, remains ABOVE vocal cords and not ejected out Pharyngeal- Thin Straw Reduced tongue base retraction;Pharyngeal residue -  valleculae;Reduced laryngeal elevation;Reduced anterior laryngeal mobility;Penetration/Aspiration during swallow Pharyngeal Material enters airway, remains ABOVE vocal cords and not ejected out Pharyngeal- Puree Reduced tongue base retraction;Pharyngeal residue - valleculae;Reduced laryngeal elevation;Reduced anterior laryngeal mobility Pharyngeal -- Pharyngeal- Mechanical Soft -- Pharyngeal -- Pharyngeal- Regular Reduced tongue base retraction;Pharyngeal residue - valleculae;Reduced laryngeal elevation;Reduced anterior laryngeal mobility Pharyngeal -- Pharyngeal- Multi-consistency -- Pharyngeal -- Pharyngeal- Pill Reduced tongue base retraction;Pharyngeal residue - valleculae;Reduced laryngeal elevation;Reduced anterior laryngeal mobility Pharyngeal -- Pharyngeal Comment --  CHL IP CERVICAL ESOPHAGEAL PHASE 09/15/2021 Cervical Esophageal Phase WFL Pudding Teaspoon -- Pudding Cup -- Honey Teaspoon -- Honey Cup -- Nectar Teaspoon -- Nectar Cup -- Nectar Straw -- Thin Teaspoon -- Thin Cup -- Thin Straw -- Puree -- Mechanical Soft -- Regular -- Multi-consistency -- Pill -- Cervical Esophageal Comment -- Shanika I. Hardin Negus, [place], [place] Office number [phone] Pager [phone] Horton Marshall 09/15/2021, 3:06 PM              EEG adult  Result Date: 09/14/2021 Lora Havens, MD     09/14/2021  4:55 PM Patient Name: LARIN WEISSBERG MRN: [id number] Epilepsy Attending: Lora Havens Referring Physician/Provider: Dr Ina Homes Date: 09/14/2021 Duration: 24.27 mins Patient history: 60 year old man with history of poorly controlled DM, TBI, PTSD, probable schizophrenia, HTN presenting with AMS and combativeness. EEG to evaluate for seizure. Level of alertness: lethargic AEDs during EEG study: LEV Technical aspects: This EEG study was done with scalp electrodes positioned according to the 10-20 International system of electrode placement. Electrical activity was acquired at a  sampling rate of '500Hz'  and reviewed with a high frequency filter of '70Hz'  and a low frequency filter of '1Hz' . EEG data were recorded continuously and digitally stored. Description: No posterior dominant rhythm was seen. EEG showed continuous generalized 3 to 6 Hz theta-delta slowing. Hyperventilation and photic stimulation were not performed.   Of note, study was technically difficult due to significant movement artifact. ABNORMALITY - Continuous slow, generalized IMPRESSION: This technically difficult study is suggestive of moderate diffuse encephalopathy, nonspecific etiology. No seizures or epileptiform discharges were seen throughout the recording. Priyanka O Yadav   VAS  [place] LOWER EXTREMITY VENOUS (DVT)  Result Date: 09/15/2021  Lower Venous DVT Study Patient Name:  LANDIS DOWDY  Date of Exam:   09/15/2021 Medical Rec #: [id number]          Accession #:    [id number] Date of Birth: 1961-01-08           Patient Gender: M Patient Age:   30 years Exam Location:  [facility] Procedure:      VAS [place] LOWER EXTREMITY VENOUS (DVT) Referring Phys: GRACE BOWSER --------------------------------------------------------------------------------  Indications: Cramping/pain - RLE.  Comparison Study: Previous exam 07/26/2016 was negative for DVT Performing Technologist: Rogelia Rohrer RVT, RDMS  Examination Guidelines: A complete evaluation includes B-mode imaging, spectral Doppler, color Doppler, and power Doppler as needed of all accessible portions of each vessel. Bilateral testing is considered an integral part of a complete examination. Limited examinations for reoccurring indications may be performed as noted. The reflux portion of the exam is performed with the patient in reverse Trendelenburg.  +---------+---------------+---------+-----------+----------+--------------+ RIGHT    CompressibilityPhasicitySpontaneityPropertiesThrombus Aging  +---------+---------------+---------+-----------+----------+--------------+ CFV      Full           Yes      Yes                                 +---------+---------------+---------+-----------+----------+--------------+ SFJ      Full                                                        +---------+---------------+---------+-----------+----------+--------------+ FV Prox  Full           Yes      Yes                                 +---------+---------------+---------+-----------+----------+--------------+ FV Mid   Full           Yes      Yes                                 +---------+---------------+---------+-----------+----------+--------------+ FV DistalFull           Yes      Yes                                 +---------+---------------+---------+-----------+----------+--------------+ PFV      Full                                                        +---------+---------------+---------+-----------+----------+--------------+ POP      Full           Yes      Yes                                 +---------+---------------+---------+-----------+----------+--------------+ PTV      Full                                                        +---------+---------------+---------+-----------+----------+--------------+  PERO     Full                                                        +---------+---------------+---------+-----------+----------+--------------+   +----+---------------+---------+-----------+----------+--------------+ LEFTCompressibilityPhasicitySpontaneityPropertiesThrombus Aging +----+---------------+---------+-----------+----------+--------------+ CFV Full           Yes      Yes                                 +----+---------------+---------+-----------+----------+--------------+     Summary: RIGHT: - There is no evidence of deep vein thrombosis in the lower extremity. - There is no evidence of superficial venous thrombosis.   - No cystic structure found in the popliteal fossa.  LEFT: - No evidence of common femoral vein obstruction.  *See table(s) above for measurements and observations. Electronically signed by Harold Barban MD on 09/15/2021 at 9:21:19 PM.    Final      Discharge Exam: Vitals:   09/21/21 0354 09/21/21 0745  BP: 137/73 (!) 129/59  Pulse: 83 88  Resp: 13 18  Temp: 98.3 F (36.8 C) 98.2 F (36.8 C)  SpO2: 100% 99%   Vitals:   09/18/21 0500 09/21/21 0001 09/21/21 0354 09/21/21 0745  BP:  (!) 147/75 137/73 (!) 129/59  Pulse:  90 83 88  Resp:  '15 13 18  ' Temp:  98.7 F (37.1 C) 98.3 F (36.8 C) 98.2 F (36.8 C)  TempSrc:  Oral Oral Oral  SpO2:  99% 100% 99%  Weight: 125.1 kg  129 kg     General: Pt is alert, awake, not in acute distress Cardiovascular: RRR, S1/S2 +, no rubs, no gallops Respiratory: CTA bilaterally, no wheezing, no rhonchi Abdominal: Soft, NT, ND, bowel sounds + Extremities: +1 pitting edema bilateral lower extremity, no cyanosis    The results of significant diagnostics from this hospitalization (including imaging, microbiology, ancillary and laboratory) are listed below for reference.     Microbiology: Recent Results (from the past 240 hour(s))  Resp Panel by RT-PCR (Flu A&B, Covid) Nasopharyngeal Swab     Status: None   Collection Time: 09/14/21  7:04 AM   Specimen: Nasopharyngeal Swab; Nasopharyngeal(NP) swabs in vial transport medium  Result Value Ref Range Status   SARS Coronavirus 2 by RT PCR NEGATIVE NEGATIVE Final    Comment: (NOTE) SARS-CoV-2 target nucleic acids are NOT DETECTED.  The SARS-CoV-2 RNA is generally detectable in upper respiratory specimens during the acute phase of infection. The lowest concentration of SARS-CoV-2 viral copies this assay can detect is 138 copies/mL. A negative result does not preclude SARS-Cov-2 infection and should not be used as the sole basis for treatment or other patient management decisions. A negative result  may occur with  improper specimen collection/handling, submission of specimen other than nasopharyngeal swab, presence of viral mutation(s) within the areas targeted by this assay, and inadequate number of viral copies(<138 copies/mL). A negative result must be combined with clinical observations, patient history, and epidemiological information. The expected result is Negative.  Fact Sheet for Patients:  EntrepreneurPulse.com.au  Fact Sheet for Healthcare Providers:  IncredibleEmployment.be  This test is no t yet approved or cleared by the [place] FDA and  has been authorized for detection and/or diagnosis of SARS-CoV-2 by FDA under an Emergency Use Authorization (  EUA). This EUA will remain  in effect (meaning this test can be used) for the duration of the COVID-19 declaration under Section 564(b)(1) of the Act, 21 U.S.C.section 360bbb-3(b)(1), unless the authorization is terminated  or revoked sooner.       Influenza A by PCR NEGATIVE NEGATIVE Final   Influenza B by PCR NEGATIVE NEGATIVE Final    Comment: (NOTE) The Xpert Xpress SARS-CoV-2/FLU/RSV plus assay is intended as an aid in the diagnosis of influenza from Nasopharyngeal swab specimens and should not be used as a sole basis for treatment. Nasal washings and aspirates are unacceptable for Xpert Xpress SARS-CoV-2/FLU/RSV testing.  Fact Sheet for Patients: EntrepreneurPulse.com.au  Fact Sheet for Healthcare Providers: IncredibleEmployment.be  This test is not yet approved or cleared by the [place] FDA and has been authorized for detection and/or diagnosis of SARS-CoV-2 by FDA under an Emergency Use Authorization (EUA). This EUA will remain in effect (meaning this test can be used) for the duration of the COVID-19 declaration under Section 564(b)(1) of the Act, 21 U.S.C. section 360bbb-3(b)(1), unless the authorization is terminated  or revoked.  Performed at [facility], [place] [address], [place], [place] [address]   MRSA Next Gen by PCR, Nasal     Status: None   Collection Time: 09/14/21 12:44 PM   Specimen: Nasal Mucosa; Nasal Swab  Result Value Ref Range Status   MRSA by PCR Next Gen NOT DETECTED NOT DETECTED Final    Comment: (NOTE) The GeneXpert MRSA Assay (FDA approved for NASAL specimens only), is one component of a comprehensive MRSA colonization surveillance program. It is not intended to diagnose MRSA infection nor to guide or monitor treatment for MRSA infections. Test performance is not FDA approved in patients less than 68 years old. Performed at [facility], [place] [address], [place], [place] [address]      Labs: BNP (last 3 results) No results for input(s): BNP in the last 8760 hours. Basic Metabolic Panel: Recent Labs  Lab 09/14/21 2205 09/15/21 0417 09/15/21 1509 09/16/21 0558 09/17/21 0657 09/18/21 0206 09/19/21 0702 09/20/21 0411 09/21/21 0753  NA 140 141   < > 138 138 138 138 137 139  K 3.1* 3.2*   < > 3.2* 3.8 3.8 4.1 4.4 4.3  CL 108 110   < > 107 109 110 110 109 110  CO2 24 21*   < > 22 22 21* 21* 20* 21*  GLUCOSE 110* 141*   < > 115* 115* 99 84 115* 114*  BUN 20 21*   < > 24* 23* 23* 22* 24* 23*  CREATININE 3.19* 3.55*   < > 4.05* 3.72* 3.35* 2.95* 2.99* 2.79*  CALCIUM 8.5* 8.7*   < > 8.8* 8.2* 8.3* 8.3* 8.5* 8.2*  MG 1.8 1.7  --  2.2  --   --   --   --   --    < > = values in this interval not displayed.   Liver Function Tests: Recent Labs  Lab 09/15/21 0417 09/17/21 0657  AST 20 11*  ALT 12 11  ALKPHOS 140* 112  BILITOT 1.4* 0.9  PROT 6.3* 5.5*  ALBUMIN 2.9* 2.5*   No results for input(s): LIPASE, AMYLASE in the last 168 hours. No results for input(s): AMMONIA in the last 168 hours. CBC: Recent Labs  Lab 09/15/21 0417 09/17/21 0657  WBC 10.0 5.7  NEUTROABS  --  2.7  HGB 12.3* 10.8*  HCT 34.5* 30.5*  MCV 82.7 84.3  PLT 234 211   Cardiac  Enzymes: No results for input(s): CKTOTAL, CKMB, CKMBINDEX, TROPONINI in the last 168 hours. BNP: Invalid input(s): POCBNP CBG: Recent Labs  Lab 09/20/21 2019 09/20/21 2044 09/21/21 0102 09/21/21 0411 09/21/21 0748  GLUCAP 241* 229* 149* 89 111*   D-Dimer No results for input(s): DDIMER in the last 72 hours. Hgb A1c No results for input(s): HGBA1C in the last 72 hours. Lipid Profile No results for input(s): CHOL, HDL, LDLCALC, TRIG, CHOLHDL, LDLDIRECT in the last 72 hours. Thyroid function studies No results for input(s): TSH, T4TOTAL, T3FREE, THYROIDAB in the last 72 hours.  Invalid input(s): FREET3 Anemia work up No results for input(s): VITAMINB12, FOLATE, FERRITIN, TIBC, IRON, RETICCTPCT in the last 72 hours. Urinalysis    Component Value Date/Time   COLORURINE YELLOW 09/15/2021 0409   APPEARANCEUR HAZY (A) 09/15/2021 0409   LABSPEC 1.016 09/15/2021 0409   LABSPEC 1.030 05/23/2017 1538   PHURINE 5.0 09/15/2021 0409   GLUCOSEU >=500 (A) 09/15/2021 0409   HGBUR SMALL (A) 09/15/2021 0409   BILIRUBINUR NEGATIVE 09/15/2021 0409   BILIRUBINUR negative 09/18/2020 1437   BILIRUBINUR neg 11/04/2016 1619   KETONESUR NEGATIVE 09/15/2021 0409   PROTEINUR >=300 (A) 09/15/2021 0409   UROBILINOGEN 0.2 09/18/2020 1437   UROBILINOGEN 1.0 12/13/2014 1029   NITRITE NEGATIVE 09/15/2021 0409   LEUKOCYTESUR NEGATIVE 09/15/2021 0409   Sepsis Labs Invalid input(s): PROCALCITONIN,  WBC,  LACTICIDVEN Microbiology Recent Results (from the past 240 hour(s))  Resp Panel by RT-PCR (Flu A&B, Covid) Nasopharyngeal Swab     Status: None   Collection Time: 09/14/21  7:04 AM   Specimen: Nasopharyngeal Swab; Nasopharyngeal(NP) swabs in vial transport medium  Result Value Ref Range Status   SARS Coronavirus 2 by RT PCR NEGATIVE NEGATIVE Final    Comment: (NOTE) SARS-CoV-2 target nucleic acids are NOT DETECTED.  The SARS-CoV-2 RNA is generally detectable in upper respiratory specimens during  the acute phase of infection. The lowest concentration of SARS-CoV-2 viral copies this assay can detect is 138 copies/mL. A negative result does not preclude SARS-Cov-2 infection and should not be used as the sole basis for treatment or other patient management decisions. A negative result may occur with  improper specimen collection/handling, submission of specimen other than nasopharyngeal swab, presence of viral mutation(s) within the areas targeted by this assay, and inadequate number of viral copies(<138 copies/mL). A negative result must be combined with clinical observations, patient history, and epidemiological information. The expected result is Negative.  Fact Sheet for Patients:  EntrepreneurPulse.com.au  Fact Sheet for Healthcare Providers:  IncredibleEmployment.be  This test is no t yet approved or cleared by the [place] FDA and  has been authorized for detection and/or diagnosis of SARS-CoV-2 by FDA under an Emergency Use Authorization (EUA). This EUA will remain  in effect (meaning this test can be used) for the duration of the COVID-19 declaration under Section 564(b)(1) of the Act, 21 U.S.C.section 360bbb-3(b)(1), unless the authorization is terminated  or revoked sooner.       Influenza A by PCR NEGATIVE NEGATIVE Final   Influenza B by PCR NEGATIVE NEGATIVE Final    Comment: (NOTE) The Xpert Xpress SARS-CoV-2/FLU/RSV plus assay is intended as an aid in the diagnosis of influenza from Nasopharyngeal swab specimens and should not be used as a sole basis for treatment. Nasal washings and aspirates are unacceptable for Xpert Xpress SARS-CoV-2/FLU/RSV testing.  Fact Sheet for Patients: EntrepreneurPulse.com.au  Fact Sheet for Healthcare Providers: IncredibleEmployment.be  This test is  not yet approved or cleared by the [place] and has been authorized for detection and/or  diagnosis of SARS-CoV-2 by FDA under an Emergency Use Authorization (EUA). This EUA will remain in effect (meaning this test can be used) for the duration of the COVID-19 declaration under Section 564(b)(1) of the Act, 21 U.S.C. section 360bbb-3(b)(1), unless the authorization is terminated or revoked.  Performed at [facility], [place] [address] [place], [place] [address]   MRSA Next Gen by PCR, Nasal     Status: None   Collection Time: 09/14/21 12:44 PM   Specimen: Nasal Mucosa; Nasal Swab  Result Value Ref Range Status   MRSA by PCR Next Gen NOT DETECTED NOT DETECTED Final    Comment: (NOTE) The GeneXpert MRSA Assay (FDA approved for NASAL specimens only), is one component of a comprehensive MRSA colonization surveillance program. It is not intended to diagnose MRSA infection nor to guide or monitor treatment for MRSA infections. Test performance is not FDA approved in patients less than 64 years old. Performed at [facility], [place] [address] [place], [place] [address]      Time coordinating discharge: Over 30 minutes  SIGNED:   Darliss Cheney, MD  [facility] 09/21/2021, 10:23 AM  If 7PM-7AM, please contact night-coverage [email or web address]

## 2021-09-21 NOTE — Care Management Important Message (Signed)
Important Message  Patient Details  Name: Brandon [name] MRN: [id number] Date of Birth: August 27, 1961   Medicare Important Message Given:  Yes     Orbie Pyo 09/21/2021, 2:53 PM

## 2021-09-21 NOTE — Progress Notes (Signed)
[facility] to be D/C'd  per MD order.  Discussed with the patient and all questions fully answered.  VSS, Skin clean, dry and intact without evidence of skin break down, no evidence of skin tears noted.  IV catheter discontinued intact. Site without signs and symptoms of complications. Dressing and pressure applied.  An After Visit Summary was printed and given to the patient. Patient received prescription.  D/c re-education completed with patient/family including follow up instructions, medication list, d/c activities limitations if indicated, with other d/c instructions as indicated by MD - patient able to verbalize understanding, all questions fully answered.   Patient instructed to return to ED, call 911, or call MD for any changes in condition.   Patient to be escorted via [place], and D/C home via private auto.

## 2021-09-22 ENCOUNTER — Telehealth: Payer: Self-pay

## 2021-09-22 NOTE — Telephone Encounter (Signed)
Transition Care Management Unsuccessful Follow-up Telephone Call  Date of discharge and from where:  [facility]  Attempts:  1st Attempt  Reason for unsuccessful TCM follow-up call:  Left voice message

## 2021-09-30 NOTE — Telephone Encounter (Signed)
Transition Care Management Unsuccessful Follow-up Telephone Call  Date of discharge and from where:  Brandon [name] 09/21/21  Attempts:  2nd Attempt  Reason for unsuccessful TCM follow-up call:  Left voice message

## 2021-10-14 ENCOUNTER — Ambulatory Visit: Payer: Medicare Other | Admitting: Medical

## 2021-10-19 ENCOUNTER — Encounter: Payer: Self-pay | Admitting: Medical

## 2021-10-19 ENCOUNTER — Other Ambulatory Visit: Payer: Self-pay

## 2021-10-19 ENCOUNTER — Ambulatory Visit (INDEPENDENT_AMBULATORY_CARE_PROVIDER_SITE_OTHER): Payer: Medicare Other | Admitting: Medical

## 2021-10-19 VITALS — BP 128/84 | HR 86 | Temp 98.7°F | Wt 279.4 lb

## 2021-10-19 DIAGNOSIS — N1832 Chronic kidney disease, stage 3b: Secondary | ICD-10-CM | POA: Diagnosis not present

## 2021-10-19 DIAGNOSIS — Z794 Long term (current) use of insulin: Secondary | ICD-10-CM

## 2021-10-19 DIAGNOSIS — Q845 Enlarged and hypertrophic nails: Secondary | ICD-10-CM

## 2021-10-19 DIAGNOSIS — S91109A Unspecified open wound of unspecified toe(s) without damage to nail, initial encounter: Secondary | ICD-10-CM | POA: Insufficient documentation

## 2021-10-19 DIAGNOSIS — E1122 Type 2 diabetes mellitus with diabetic chronic kidney disease: Secondary | ICD-10-CM

## 2021-10-19 DIAGNOSIS — L84 Corns and callosities: Secondary | ICD-10-CM | POA: Insufficient documentation

## 2021-10-19 DIAGNOSIS — E1159 Type 2 diabetes mellitus with other circulatory complications: Secondary | ICD-10-CM

## 2021-10-19 DIAGNOSIS — D649 Anemia, unspecified: Secondary | ICD-10-CM

## 2021-10-19 DIAGNOSIS — G40909 Epilepsy, unspecified, not intractable, without status epilepticus: Secondary | ICD-10-CM

## 2021-10-19 DIAGNOSIS — E1169 Type 2 diabetes mellitus with other specified complication: Secondary | ICD-10-CM

## 2021-10-19 DIAGNOSIS — E1142 Type 2 diabetes mellitus with diabetic polyneuropathy: Secondary | ICD-10-CM

## 2021-10-19 DIAGNOSIS — Z23 Encounter for immunization: Secondary | ICD-10-CM

## 2021-10-19 DIAGNOSIS — Z91199 Patient's noncompliance with other medical treatment and regimen due to unspecified reason: Secondary | ICD-10-CM | POA: Diagnosis not present

## 2021-10-19 DIAGNOSIS — Z8673 Personal history of transient ischemic attack (TIA), and cerebral infarction without residual deficits: Secondary | ICD-10-CM

## 2021-10-19 DIAGNOSIS — I6930 Unspecified sequelae of cerebral infarction: Secondary | ICD-10-CM

## 2021-10-19 DIAGNOSIS — N1831 Chronic kidney disease, stage 3a: Secondary | ICD-10-CM

## 2021-10-19 DIAGNOSIS — L089 Local infection of the skin and subcutaneous tissue, unspecified: Secondary | ICD-10-CM | POA: Diagnosis not present

## 2021-10-19 DIAGNOSIS — Z85528 Personal history of other malignant neoplasm of kidney: Secondary | ICD-10-CM

## 2021-10-19 DIAGNOSIS — E785 Hyperlipidemia, unspecified: Secondary | ICD-10-CM

## 2021-10-19 DIAGNOSIS — F99 Mental disorder, not otherwise specified: Secondary | ICD-10-CM | POA: Insufficient documentation

## 2021-10-19 DIAGNOSIS — I152 Hypertension secondary to endocrine disorders: Secondary | ICD-10-CM

## 2021-10-19 MED ORDER — AMOXICILLIN-POT CLAVULANATE 875-125 MG PO TABS: 1.0000 | ORAL_TABLET | Freq: Two times a day (BID) | ORAL | 0 refills | Status: DC

## 2021-10-19 MED ORDER — AMLODIPINE BESYLATE 10 MG PO TABS: 10.0000 mg | ORAL_TABLET | Freq: Every day | ORAL | 0 refills | Status: DC

## 2021-10-19 MED ORDER — LEVETIRACETAM 500 MG PO TABS: 1000.0000 mg | ORAL_TABLET | Freq: Every day | ORAL | 1 refills | Status: DC

## 2021-10-19 MED ORDER — SERTRALINE HCL 25 MG PO TABS: 25.0000 mg | ORAL_TABLET | Freq: Every day | ORAL | 2 refills | Status: DC

## 2021-10-19 MED ORDER — QUETIAPINE FUMARATE 25 MG PO TABS: 25.0000 mg | ORAL_TABLET | Freq: Every day | ORAL | 2 refills | Status: DC

## 2021-10-19 MED ORDER — ATORVASTATIN CALCIUM 40 MG PO TABS: 40.0000 mg | ORAL_TABLET | Freq: Every day | ORAL | 1 refills | Status: DC

## 2021-10-19 MED ORDER — EZETIMIBE 10 MG PO TABS: 10.0000 mg | ORAL_TABLET | Freq: Every day | ORAL | 1 refills | Status: DC

## 2021-10-19 NOTE — Progress Notes (Signed)
Subjective:  Brandon [name] is a 60 y.o. male who presents for Chief Complaint  Patient presents with   other    Cut on rt big toe on rt. Foot possibly from nail on the metal piece between the carpet and tile floors about 1 week ago      Here for toe wound.  Accompanied by girlfriend.  Last visit here 09/2020.   Brandon [name] has a health history significant for uncontrolled diabetes, history of stroke, CKD, history of renal cell cancer, history of seizure disorder, hypertension, hyperlipidemia, thyroid disease, sleep apnea, prior DKA, noncompliance in general and also part noncompliance likely due to mental health issues, dysarthria as a effect of prior stroke and other residual stroke effects.  He was hospitalized in October for hypertensive urgency but did not follow-up here after his hospitalization.  Looking through the chart records he has not seen endocrinology in over a year.  He does not have his medications with him today so it is not completely clear what medicines he has been taking regularly or not  His only complaint today is toe wound.  He uses goes barefooted in the house.  About 10 days ago he caught his right great toe on a carpet tack strip that was exposed.  He had cuts to his toes on the right and bleeding.  He was able to get the bleeding to stop over the course of the last week he has noticed some pus drainage from the toe.[name]  He denies any significant pain but he also has neuropathy and numbness of the feet.  His girlfriend says his foot was quite swollen several days ago but the swelling has went down  He denies fever, body aches or chills  He reports that he is taking some of his medications.  Not sure about home blood sugar readings  No other aggravating or relieving factors.    No other c/o.  Past Medical History:  Diagnosis Date   Acute ischemic stroke ([place]) 11/2013   Allergy    Anxiety    Arthritis    Benign hypertension with CKD (chronic kidney disease)  stage III (HCC)    Bil Renal Ca dx'd 09/2011 & 11/2011   left and right; cryoablation bil   CVA (cerebral vascular accident) ([place])    stroke   Depression    BH Adm in [place] Depression   Diabetes mellitus    DKA prior hospitalization   Diverticulitis    s/p micorperforation Sept 2012-managed conservatively by Gen surgery   ED (erectile dysfunction)    Focal seizure ([place]) 11/2013   due to ischemic stroke   GERD (gastroesophageal reflux disease)    Hiatal hernia    Hyperlipidemia    Hypertension    Kidney tumor 09/2011   Renal cell CA   Seizures ([place])    none since 2016, taking Keppra - maw   Sleep apnea    Thyroid disease    "weak thyroid" per MD   Wears glasses    Current Outpatient Medications on File Prior to Visit  Medication Sig Dispense Refill   amLODipine (NORVASC) 10 MG tablet Take 1 tablet (10 mg total) by mouth daily. 30 tablet 0   blood glucose meter kit and supplies Dispense based on patient and insurance preference. Use up to four times daily as directed. (FOR ICD-9 250.00, 250.01). (Patient taking differently: 1 each by Other route See admin instructions. Dispense based on patient and insurance preference. Use up to four times daily  as directed. (FOR ICD-9 250.00, 250.01).) 1 each 0   glucose blood (ACCU-CHEK ACTIVE STRIPS) test strip Use as instructed (Patient taking differently: 1 each by Other route as directed.) 100 each 12   glucose blood test strip accu chek active 1-2 times daily (Patient taking differently: 1 each by Other route See admin instructions. accu chek active 1-2 times daily) 100 each 12   hydrALAZINE (APRESOLINE) 100 MG tablet Take 1 tablet (100 mg total) by mouth every 8 (eight) hours. 90 tablet 0   insulin glargine (LANTUS) 100 UNIT/ML Solostar Pen Inject 20 Units into the skin daily. 6 mL 2   Insulin Pen Needle (NOVOFINE PLUS PEN NEEDLE) 32G X 4 MM MISC Use as directed with insulin pen 100 each 2   Lancets (ACCU-CHEK SOFT TOUCH) lancets Use as  instructed (Patient taking differently: 1 each by Other route as directed.) 100 each 12   UNABLE TO FIND Med Name:  Custom diabetic shoes  Dx: C58.85 1 Device 0   atorvastatin (LIPITOR) 40 MG tablet Take 1 tablet (40 mg total) by mouth daily. 30 tablet 2   ezetimibe (ZETIA) 10 MG tablet Take 1 tablet (10 mg total) by mouth daily. 30 tablet 2   insulin aspart (NOVOLOG) 100 UNIT/ML FlexPen Inject 2-10 Units into the skin 3 (three) times daily with meals. If glucose 150-200 take 2 units, glucose 201-250 take 4 units, 251-300 take 6 units, glucose 301-350 take 8 units, glucose 351-400 take 10 units 15 mL 3   levETIRAcetam (KEPPRA) 500 MG tablet Take 2 tablets (1,000 mg total) by mouth daily. 60 tablet 2   omeprazole (PRILOSEC) 40 MG capsule Take 1 capsule (40 mg total) by mouth daily. (Patient taking differently: Take 40 mg by mouth daily as needed (heartburn/indigestion).) 30 capsule 2   QUEtiapine (SEROQUEL) 25 MG tablet Take 1 tablet (25 mg total) by mouth at bedtime. 30 tablet 2   sertraline (ZOLOFT) 25 MG tablet Take 1 tablet (25 mg total) by mouth daily. 30 tablet 2   No current facility-administered medications on file prior to visit.     The following portions of the patient's history were reviewed and updated as appropriate: allergies, current medications, past family history, past medical history, past social history, past surgical history and problem list.  ROS Otherwise as in subjective above    Objective: BP 128/84   Pulse 86   Temp 98.7 F (37.1 C)   Wt 279 lb 6.4 oz (126.7 kg)   BMI 36.86 kg/m   General appearance: alert, no distress, well developed, well nourished Neck: supple, no lymphadenopathy, no thyromegaly, no masses, no JVD Heart: RRR, normal S1, S2, no murmurs Lungs: CTA bilaterally, no wheezes, rhonchi, or rales Pulses: 2+ radial pulses, 1+ pedal pulses, normal cap refill Ext: 1+ pitting edema of lower extremities bilaterally  Diabetic Foot Exam - Simple    Simple Foot Form Diabetic Foot exam was performed with the following findings: Yes 10/19/2021  5:01 PM  Visual Inspection See comments: Yes Sensation Testing See comments: Yes Pulse Check See comments: Yes Comments 1+ pedal pulses bilaterally, decreased monofilament sensation throughout several toes in particular right great toe, flat feet bilaterally, right medial distal great toe with 2.3 cm diameter area of dark brownish coloration with central 5 mm diamter open wound and purulent discharge.  No obvious fluctuance warmth or induration   thickened callous of distal right great toe, callous of left dorsal distal great toe, thickened toenails thorughout paritcularly both great toenails.  Nontender of right great toe, but sensation is decreased in general of several toes chronically        Assessment: Encounter Diagnoses  Name Primary?   Open wound of toe, initial encounter Yes   Skin infection    Enlarged and hypertrophic nails    Callus    Need for Td vaccine    Need for influenza vaccination    Stage 3b chronic kidney disease ([place])    Type 2 diabetes mellitus with stage 3a chronic kidney disease, with long-term current use of insulin ([place])    Type 2 diabetes mellitus with diabetic polyneuropathy, with long-term current use of insulin (HCC)    Seizure disorder ([place])    Noncompliance    Hypertension associated with diabetes ([place])    History of stroke    History of renal cell cancer    History of cerebrovascular accident (CVA) with residual deficit    Dyslipidemia associated with type 2 diabetes mellitus (HCC)    Anemia, unspecified type    Mental health disorder      Plan: Toe wound, skin infection in an uncontrolled diabetic-begin Augmentin antibiotic, tetanus updated today, will send for x-ray.  We discussed the serious nature of this with pain sometimes goes bad in a hurry with foot wounds in a diabetic.  Advise if any worse signs or symptoms in the next 2 to 3 days  come in right away or get reevaluated right away.  Referral to podiatry.  If podiatry visit is going to be greater than a week from now we will get him back later this week for follow-up.  Otherwise he does need to have podiatry follow-up and manage the wound as well as other diabetic nail care as he has hypertrophic nails and callus as well as neuropathy  I reviewed his recent hospitalization records.  Labs today per their recommendations.  Unfortunately he has not been compliant but this is nothing new.  The last several years we have had trouble with compliance with medications and follow-up.  We have made attempts to get social worker involved to help him stay on top of visits and appointments, but there have been barriers to this taking place as well.  I am not sure if he is declining care or not answering the door or not able to be contacted for some of these points of contact.  Our office reached out to contact him for transition of care follow-up after his hospitalization twice and unable to get anybody on the phone.  He has been seeing psychiatry recently.  We have made attempts multiple times in the past referring to psychiatry in various things will happen where he does not follow through or insurance can be an issue with finding providers that will take his insurance  He is not compliant in general with medicines for diabetes and cholesterol and blood pressure  I recommended he work to be better compliant with his medications and treatment follow-up.  I will reach out to his endocrinologist to try to facilitate him getting back in soon  We will make a referral today for podiatry  We will check on status of social worker involvement.   Counseled on the Td (tetanus, diptheria) vaccine.  Vaccine information sheet given. Td vaccine given after consent obtained.  Counseled on the influenza virus vaccine.  Vaccine information sheet given.  Influenza vaccine given after consent  obtained.   [place] was seen today for other.  Diagnoses and all orders for this visit:  Open wound of toe, initial encounter -     DG Toe Great Right; Future -     Ambulatory referral to Podiatry -     Basic metabolic panel -     CBC with Differential/Platelet  Skin infection -     DG Toe Great Right; Future -     Ambulatory referral to Podiatry -     Basic metabolic panel -     CBC with Differential/Platelet  Enlarged and hypertrophic nails -     Ambulatory referral to Podiatry  Callus -     Ambulatory referral to Podiatry  Need for Td vaccine  Need for influenza vaccination -     Flu Vaccine QUAD 6+ mos PF IM (Fluarix Quad PF)  Stage 3b chronic kidney disease ([place]) -     Basic metabolic panel -     CBC with Differential/Platelet  Type 2 diabetes mellitus with stage 3a chronic kidney disease, with long-term current use of insulin (HCC)  Type 2 diabetes mellitus with diabetic polyneuropathy, with long-term current use of insulin (HCC)  Seizure disorder ([place])  Noncompliance  Hypertension associated with diabetes ([place])  History of stroke  History of renal cell cancer  History of cerebrovascular accident (CVA) with residual deficit  Dyslipidemia associated with type 2 diabetes mellitus (HCC)  Anemia, unspecified type  Mental health disorder  Other orders -     amoxicillin-clavulanate (AUGMENTIN) 875-125 MG tablet; Take 1 tablet by mouth 2 (two) times daily. -     Td : Tetanus/diphtheria >7yo Preservative  free   Follow up: pending labs, referral

## 2021-10-19 NOTE — Patient Instructions (Signed)
We updated your tetanus booster today  I am checking some labs from where you were hospitalized recently  I will reach out to your diabetes specialist to get you back in soon  We are referring you back to foot doctor/podiatry  I am putting you on antibiotics for toe infection.  Go for x-ray   Please go to [facility] for your toe xray.   Their hours are 8am - 4:30 pm Monday - Friday.  Take your insurance card with you.  [facility] [phone]  [address], [place], [place] [address]  [address],  [id number]   If you do not see the foot doctor this week then come back in here by Thursday or Friday for recheck  If any sign of worsening infection over the next few days such as fever, worse redness, or swelling, worse pain, chills, nausea, then get checked out right away   You really should be seeing your diabetes specialist every 3 months and given your chronic issues I recommend chronic care management.  I recommend you being managed through the chronic care management department in conjunction with [place].  I will check on a referral for this     Osteomyelitis, Adult Bone infections, also called osteomyelitis,occur when bacteria or other germs get inside a bone. This can happen if you have an infection in another part of your body that spreads through your blood. It can also happen if you have a wound or a broken bone (fracture) that breaks the skin. A wound or a fracture can allow germs from your skin or from outside of your body to spread to your bone. Bone infections need to be treated quickly to: Prevent bone damage. Prevent the infection from spreading to other areas of your body. What are the causes? Most bone infections are caused by bacteria. The most common bacteria is one found on the skin (staphylococcus). Bone infections can also be caused by other germs, such as viruses and funguses. What increases the risk? You are more  likely to develop this condition if: You recently had surgery, especially bone or joint surgery. You have had an injury, such as stepping on a nail or having a fracture that exposes bones through the skin. You have a long-term (chronic) disease, such as: Diabetes. HIV (human immunodeficiency virus). Rheumatoid arthritis. Sickle cell anemia. Kidney disease that requires dialysis. You have a condition that affects your body's defense system (immune system) or you take medicines that block or weaken the immune system. You have a condition that reduces your blood flow. You have an artificial joint. You have had a joint or bone repaired with plates or screws. You use IV drugs. What are the signs or symptoms? Symptoms vary depending on the type and location of your infection. Common symptoms of bone infections include: Fever and chills. Skin redness and warmth. Swelling. Pain and stiffness. Drainage of fluid or pus near the infection. How is this diagnosed? This condition may be diagnosed based on: Your symptoms and medical history. A physical exam. Tests, such as: A sample of tissue, fluid, or blood taken to be examined under a microscope. Pus or discharge swabbed from a wound for testing. This is to identify the type of germs and to determine what type of medicine will kill them. Blood tests. Imaging studies, including X-rays, MRI, CT scan, bone scan, or ultrasound. How is this treated? Treatment for this condition depends on the cause and type of infection. Antibiotic  medicines are usually the first treatment for a bone infection. This may be done in a hospital at first. You may have to continue IV antibiotics at home or take antibiotics by mouth for several weeks after that. Other treatments may include surgery to: Remove dead or dying tissue from a bone. Remove an infected artificial joint. Remove infected plates or screws that were used to repair a broken bone. Follow these  instructions at home: Medicines Take over-the-counter and prescription medicines as told by your health care provider. Finish all antibiotic medicine even if you start to feel better. Follow instructions from your health care provider about how to take IV antibiotics at home. You may need to have a nurse come to your home to give you the IV antibiotics. Managing pain, stiffness, and swelling If directed, put ice on the affected area. To do this: Put ice in a plastic bag. Place a towel between your skin and the bag. Leave the ice on for 20 minutes, 2-3 times a day.  General instructions Ask your health care provider if you have any restrictions on your activities. Do not use any products that contain nicotine or tobacco, such as cigarettes, e-cigarettes, and chewing tobacco. If you need help quitting, ask your health care provider. Keep all follow-up visits as told by your health care provider. This is important. How is this prevented? Wash your hands often to stop the spread of germs. Wash your hands for at least 20 seconds with soap and water. If soap and water are not available, use hand sanitizer. Keep any open areas, cuts, or wounds clean. Apply a clean bandage after cleaning the area. Check wounds frequently for signs of infections. Signs of infection include redness, swelling, warmth, pus, or a bad smell. Wear proper footwear to avoid injuries to the feet. Contact a health care provider if: You develop a fever or chills. You have redness, warmth, pain, or swelling that returns after treatment. Get help right away if: You have rapid breathing or you have trouble breathing. You have chest pain. You cannot drink fluids or make urine. The affected area swells, changes color, or turns blue. You have numbness or severe pain in the affected area. These symptoms may represent a serious problem that is an emergency. Do not wait to see if the symptoms will go away. Get medical help right  away. Call your local emergency services (911 in the [place] Do not drive yourself to the hospital. Summary Bone infections, also called osteomyelitis,occur when bacteria or other germs get inside a bone. You are more likely to get this type of infection if you have a condition that lowers your ability to fight infections. You are also likely to get this condition if you take medicines that block or weaken the immune system. Most bone infections are caused by bacteria. They can also be caused by other germs, such as viruses and funguses. Treatment for this condition usually starts with taking antibiotics. Further treatment depends on the cause and type of infection. This information is not intended to replace advice given to you by your health care provider. Make sure you discuss any questions you have with your health care provider. Document Revised: 02/06/2020 Document Reviewed: 02/06/2020 Elsevier Patient Education  [employer].

## 2021-10-20 LAB — CBC WITH DIFFERENTIAL/PLATELET
Basophils Absolute: 0 10*3/uL (ref 0.0–0.2)
Basos: 1 %
EOS (ABSOLUTE): 0.1 10*3/uL (ref 0.0–0.4)
Eos: 2 %
Hematocrit: 35.8 % — ABNORMAL LOW (ref 37.5–51.0)
Hemoglobin: 12 g/dL — ABNORMAL LOW (ref 13.0–17.7)
Immature Grans (Abs): 0 10*3/uL (ref 0.0–0.1)
Immature Granulocytes: 0 %
Lymphocytes Absolute: 1.5 10*3/uL (ref 0.7–3.1)
Lymphs: 27 %
MCH: 29.1 pg (ref 26.6–33.0)
MCHC: 33.5 g/dL (ref 31.5–35.7)
MCV: 87 fL (ref 79–97)
Monocytes Absolute: 0.5 10*3/uL (ref 0.1–0.9)
Monocytes: 9 %
Neutrophils Absolute: 3.5 10*3/uL (ref 1.4–7.0)
Neutrophils: 61 %
Platelets: 303 10*3/uL (ref 150–450)
RBC: 4.13 x10E6/uL — ABNORMAL LOW (ref 4.14–5.80)
RDW: 12 % (ref 11.6–15.4)
WBC: 5.7 10*3/uL (ref 3.4–10.8)

## 2021-10-20 LAB — BASIC METABOLIC PANEL
BUN/Creatinine Ratio: 12 (ref 9–20)
BUN: 28 mg/dL — ABNORMAL HIGH (ref 6–24)
CO2: 21 mmol/L (ref 20–29)
Calcium: 9.1 mg/dL (ref 8.7–10.2)
Chloride: 107 mmol/L — ABNORMAL HIGH (ref 96–106)
Creatinine, Ser: 2.33 mg/dL — ABNORMAL HIGH (ref 0.76–1.27)
Glucose: 152 mg/dL — ABNORMAL HIGH (ref 70–99)
Potassium: 4.6 mmol/L (ref 3.5–5.2)
Sodium: 142 mmol/L (ref 134–144)
eGFR: 32 mL/min/{1.73_m2} — ABNORMAL LOW (ref >59)

## 2021-10-21 ENCOUNTER — Telehealth: Payer: Self-pay

## 2021-10-22 ENCOUNTER — Encounter: Payer: Self-pay | Admitting: Internal Medicine

## 2021-10-22 ENCOUNTER — Ambulatory Visit
Admission: RE | Admit: 2021-10-22 | Discharge: 2021-10-22 | Disposition: A | Discharge status: Home / Self Care | Payer: Medicare Other | Source: Ambulatory Visit | Attending: Medical | Admitting: Medical

## 2021-10-22 DIAGNOSIS — Z872 Personal history of diseases of the skin and subcutaneous tissue: Secondary | ICD-10-CM | POA: Diagnosis not present

## 2021-10-22 DIAGNOSIS — L089 Local infection of the skin and subcutaneous tissue, unspecified: Secondary | ICD-10-CM

## 2021-10-22 DIAGNOSIS — S91109A Unspecified open wound of unspecified toe(s) without damage to nail, initial encounter: Secondary | ICD-10-CM

## 2021-10-22 DIAGNOSIS — S91101A Unspecified open wound of right great toe without damage to nail, initial encounter: Secondary | ICD-10-CM | POA: Diagnosis not present

## 2021-10-22 DIAGNOSIS — M19071 Primary osteoarthritis, right ankle and foot: Secondary | ICD-10-CM | POA: Diagnosis not present

## 2021-10-22 NOTE — Telephone Encounter (Signed)
Pt called back & I informed of labs results.  He is going to go for X-ray today.  He did get call from Podiatry and has appt.  He will call Dr. Kelton Pillar.  Also asked about coming in for appt with pharmacist on 11/04/21, he will try to get transportation lined up and call me back.

## 2021-11-04 ENCOUNTER — Telehealth: Payer: Self-pay | Admitting: Internal Medicine

## 2021-11-04 HISTORY — PX: MRI: SHX5353

## 2021-11-04 NOTE — Telephone Encounter (Signed)
Pt called and wants to know if he can be referred to counseling or a Education officer, museum for PSTD and depression.

## 2021-11-05 NOTE — Telephone Encounter (Signed)
Pt scheduled for next Thursday.

## 2021-11-05 NOTE — Telephone Encounter (Signed)
I have put in a referral to [facility] for them to help patient find a therapist due to medicare and medicaid.

## 2021-11-10 ENCOUNTER — Telehealth: Payer: Self-pay | Admitting: Medical

## 2021-11-10 ENCOUNTER — Other Ambulatory Visit: Payer: Self-pay | Admitting: Medical

## 2021-11-10 NOTE — Chronic Care Management (AMB) (Signed)
    Chronic Care Management Pharmacy Assistant   Name: Brandon [name]  MRN: [id number] DOB: 1961/10/27  11/10/21 APPOINTMENT REMINDER   Called Patient No answer, left message of appointment on 11/11/21 at 4 via office visit with Jeni Salles, Pharm D.   Notified to have all medications, supplements, blood pressure and/or blood sugar logs available during appointment and to return call if need to reschedule.   Care Gaps: Hepatitis Screening - Overdue Zoster Vaccine - Overdue Eye Exam - Overdue PNA Vaccine - Overdue Urine Micro - Overdue COVID Booster - Overdue  Star Rating Drug: Atorvastatin (Lipitor) 40 mg - Last filled 07/10/21 90 DS at [facility]   Any gaps in medications fill history? Yes [facility] states this Is on hold and not active fill date correct   Medications: Outpatient Encounter Medications as of 11/10/2021  Medication Sig   amLODipine (NORVASC) 10 MG tablet Take 1 tablet (10 mg total) by mouth daily.   amoxicillin-clavulanate (AUGMENTIN) 875-125 MG tablet Take 1 tablet by mouth 2 (two) times daily.   atorvastatin (LIPITOR) 40 MG tablet Take 1 tablet (40 mg total) by mouth daily.   blood glucose meter kit and supplies Dispense based on patient and insurance preference. Use up to four times daily as directed. (FOR ICD-9 250.00, 250.01). (Patient taking differently: 1 each by Other route See admin instructions. Dispense based on patient and insurance preference. Use up to four times daily as directed. (FOR ICD-9 250.00, 250.01).)   ezetimibe (ZETIA) 10 MG tablet Take 1 tablet (10 mg total) by mouth daily.   glucose blood (ACCU-CHEK ACTIVE STRIPS) test strip Use as instructed (Patient taking differently: 1 each by Other route as directed.)   glucose blood test strip accu chek active 1-2 times daily (Patient taking differently: 1 each by Other route See admin instructions. accu chek active 1-2 times daily)   hydrALAZINE (APRESOLINE) 100 MG tablet Take 1 tablet (100 mg total) by  mouth every 8 (eight) hours.   insulin aspart (NOVOLOG) 100 UNIT/ML FlexPen Inject 2-10 Units into the skin 3 (three) times daily with meals. If glucose 150-200 take 2 units, glucose 201-250 take 4 units, 251-300 take 6 units, glucose 301-350 take 8 units, glucose 351-400 take 10 units   insulin glargine (LANTUS) 100 UNIT/ML Solostar Pen Inject 20 Units into the skin daily.   Insulin Pen Needle (NOVOFINE PLUS PEN NEEDLE) 32G X 4 MM MISC Use as directed with insulin pen   Lancets (ACCU-CHEK SOFT TOUCH) lancets Use as instructed (Patient taking differently: 1 each by Other route as directed.)   levETIRAcetam (KEPPRA) 500 MG tablet Take 2 tablets (1,000 mg total) by mouth daily.   omeprazole (PRILOSEC) 40 MG capsule Take 1 capsule (40 mg total) by mouth daily. (Patient taking differently: Take 40 mg by mouth daily as needed (heartburn/indigestion).)   QUEtiapine (SEROQUEL) 25 MG tablet Take 1 tablet (25 mg total) by mouth at bedtime.   sertraline (ZOLOFT) 25 MG tablet Take 1 tablet (25 mg total) by mouth daily.   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       [facility] Clinical Pharmacist Assistant [phone]

## 2021-11-11 ENCOUNTER — Other Ambulatory Visit: Payer: Self-pay

## 2021-11-11 ENCOUNTER — Ambulatory Visit: Payer: Medicare Other | Admitting: Pharmacist

## 2021-11-11 DIAGNOSIS — E1142 Type 2 diabetes mellitus with diabetic polyneuropathy: Secondary | ICD-10-CM

## 2021-11-11 DIAGNOSIS — I152 Hypertension secondary to endocrine disorders: Secondary | ICD-10-CM

## 2021-11-11 NOTE — Progress Notes (Signed)
Chronic Care Management Pharmacy Note  11/18/2021 Name:  Brandon [name] MRN:  [id number] DOB:  02-Jan-1961  Summary: A1c not at goal <7% LDL not at goal < 70 Pt is having significant ankle swelling  Recommendations/Changes made from today's visit: -Recommended CGM for blood glucose monitoring -Recommend repeat repeat levetiracetam level as previous was subtherapeutic -Recommended adherence packaging for medications -Consider decreasing amlodipine due to swelling and beta blocker for BP lowering -Recommend repeat lipid panel  Plan: Follow up with PCP in 1 week for swelling  Subjective: Brandon [name] is an 60 y.o. year old male who is a primary patient of Brandon Hurl, PA-C.  The CCM team was consulted for assistance with disease management and care coordination needs.    Engaged with patient face to face for initial visit in response to provider referral for pharmacy case management and/or care coordination services.   Consent to Services:  The patient was given the following information about Chronic Care Management services today, agreed to services, and gave verbal consent: 1. CCM service includes personalized support from designated clinical staff supervised by the primary care provider, including individualized plan of care and coordination with other care providers 2. 24/7 contact phone numbers for assistance for urgent and routine care needs. 3. Service will only be billed when office clinical staff spend 20 minutes or more in a month to coordinate care. 4. Only one practitioner may furnish and bill the service in a calendar month. 5.The patient may stop CCM services at any time (effective at the end of the month) by phone call to the office staff. 6. The patient will be responsible for cost sharing (co-pay) of up to 20% of the service fee (after annual deductible is met). Patient agreed to services and consent obtained.  Patient Care Team: Brandon [name] as  PCP - General (Family Medicine)  Recent office visits: 10/19/21 Chana Bode, PA-C: Patient presented for cut on big toe. Prescribed Augmentin and referred to podiatry.   Recent consult visits: None  Hospital visits: 10/11-10/18/22 Patient admitted for [facility] for hyperglycemia, hypertensive urgency and altered mental status.  Objective:  Lab Results  Component Value Date   CREATININE 2.33 (H) 10/19/2021   BUN 28 (H) 10/19/2021   GFRNONAA 25 (L) 09/21/2021   GFRAA 44 (L) 09/18/2020   NA 142 10/19/2021   K 4.6 10/19/2021   CALCIUM 9.1 10/19/2021   CO2 21 10/19/2021   GLUCOSE 152 (H) 10/19/2021    Lab Results  Component Value Date/Time   HGBA1C 8.4 (H) 09/14/2021 09:52 AM   HGBA1C 9.8 (H) 07/09/2021 12:41 AM   MICROALBUR 0.4 04/23/2015 12:01 AM   MICROALBUR 1.42 07/29/2014 09:10 AM    Last diabetic Eye exam:  Lab Results  Component Value Date/Time   HMDIABEYEEXA No Retinopathy 06/11/2015 12:00 AM    Last diabetic Foot exam: No results found for: HMDIABFOOTEX   Lab Results  Component Value Date   CHOL 162 01/09/2020   HDL 40 01/09/2020   LDLCALC 107 (H) 01/09/2020   TRIG 79 01/09/2020   CHOLHDL 4.1 01/09/2020    Hepatic Function Latest Ref Rng & Units 09/17/2021 09/15/2021 09/14/2021  Total Protein 6.5 - 8.1 g/dL 5.5(L) 6.3(L) 7.8  Albumin 3.5 - 5.0 g/dL 2.5(L) 2.9(L) 3.8  AST 15 - 41 U/L 11(L) 20 19  ALT 0 - 44 U/L '11 12 15  ' Alk Phosphatase 38 - 126 U/L 112 140(H) 200(H)  Total Bilirubin 0.3 - 1.2  mg/dL 0.9 1.4(H) 1.4(H)  Bilirubin, Direct 0.1 - 0.5 mg/dL - - -    Lab Results  Component Value Date/Time   TSH 0.890 07/04/2021 10:44 PM   TSH 2.25 02/11/2020 10:26 AM   TSH 0.41 12/29/2016 11:44 AM   FREET4 1.1 12/29/2016 11:44 AM    CBC Latest Ref Rng & Units 10/19/2021 09/17/2021 09/15/2021  WBC 3.4 - 10.8 x10E3/uL 5.7 5.7 10.0  Hemoglobin 13.0 - 17.7 g/dL 12.0(L) 10.8(L) 12.3(L)  Hematocrit 37.5 - 51.0 % 35.8(L) 30.5(L) 34.5(L)   Platelets 150 - 450 x10E3/uL 303 211 234    No results found for: VD25OH  Clinical ASCVD: Yes  The ASCVD Risk score (Arnett DK, et al., 2019) failed to calculate for the following reasons:   The patient has a prior MI or stroke diagnosis    Depression screen [facility] 2/9 01/09/2020 04/24/2018  Decreased Interest 1 0  Down, Depressed, Hopeless 2 1  PHQ - 2 Score 3 1  Altered sleeping 1 -  Tired, decreased energy 3 -  Change in appetite 3 -  Feeling bad or failure about yourself  3 -  Trouble concentrating 2 -  Moving slowly or fidgety/restless 0 -  Suicidal thoughts 2 -  PHQ-9 Score 17 -  Difficult doing work/chores Very difficult -  Some recent data might be hidden     Social History   Tobacco Use  Smoking Status Never  Smokeless Tobacco Never   BP Readings from Last 3 Encounters:  10/19/21 128/84  09/21/21 (!) 129/59  07/10/21 134/65   Pulse Readings from Last 3 Encounters:  10/19/21 86  09/21/21 88  07/10/21 (!) 57   Wt Readings from Last 3 Encounters:  10/19/21 279 lb 6.4 oz (126.7 kg)  09/21/21 284 lb 8 oz (129 kg)  07/09/21 272 lb 7.8 oz (123.6 kg)   BMI Readings from Last 3 Encounters:  10/19/21 36.86 kg/m  09/21/21 37.54 kg/m  07/09/21 35.95 kg/m    Assessment/Interventions: Review of patient past medical history, allergies, medications, health status, including review of consultants reports, laboratory and other test data, was performed as part of comprehensive evaluation and provision of chronic care management services.   SDOH:  (Social Determinants of Health) assessments and interventions performed: Yes SDOH Interventions    Flowsheet Row Most Recent Value  SDOH Interventions   Financial Strain Interventions Intervention Not Indicated      SDOH Screenings   Alcohol Screen: Not on file  Depression (PHQ2-9): Not on file  Financial Resource Strain: Low Risk    Difficulty of Paying Living Expenses: Not very hard  Food Insecurity: Not on file   Housing: Not on file  Physical Activity: Not on file  Social Connections: Not on file  Stress: Not on file  Tobacco Use: Low Risk    Smoking Tobacco Use: Never   Smokeless Tobacco Use: Never   Passive Exposure: Not on file  Transportation Needs: Not on file    [place]  Allergies  Allergen Reactions   Morphine Itching    Medications Reviewed Today     Reviewed by Caryl Ada (Physician Assistant) on 10/19/21 at [place] List Status: <None>   Medication Order Taking? Sig Documenting Provider Last Dose Status Informant  amLODipine (NORVASC) 10 MG tablet [id number] Yes Take 1 tablet (10 mg total) by mouth daily. Darliss Cheney, MD Taking Active   atorvastatin (LIPITOR) 40 MG tablet [id number]  Take 1 tablet (40 mg total) by mouth daily. [place]  Donnamarie Poag, DO  Active Spouse/Significant Other  blood glucose meter kit and supplies [id number] Yes Dispense based on patient and insurance preference. Use up to four times daily as directed. (FOR ICD-9 250.00, 250.01).  Patient taking differently: 1 each by Other route See admin instructions. Dispense based on patient and insurance preference. Use up to four times daily as directed. (FOR ICD-9 250.00, 250.01).   Eugenie Filler, MD Taking Active   ezetimibe (ZETIA) 10 MG tablet [id number]  Take 1 tablet (10 mg total) by mouth daily. [place] Donnamarie Poag, DO  Active Spouse/Significant Other  glucose blood (ACCU-CHEK ACTIVE STRIPS) test strip [id number] Yes Use as instructed  Patient taking differently: 1 each by Other route as directed.   Tysinger, Camelia Eng, PA-C Taking Active   glucose blood test strip [id number] Yes accu chek active 1-2 times daily  Patient taking differently: 1 each by Other route See admin instructions. accu chek active 1-2 times daily   Brandon Hurl, PA-C Taking Active   hydrALAZINE (APRESOLINE) 100 MG tablet [id number] Yes Take 1 tablet (100 mg total) by mouth every 8 (eight) hours. Darliss Cheney, MD Taking  Active   insulin aspart (NOVOLOG) 100 UNIT/ML FlexPen [id number]  Inject 2-10 Units into the skin 3 (three) times daily with meals. If glucose 150-200 take 2 units, glucose 201-250 take 4 units, 251-300 take 6 units, glucose 301-350 take 8 units, glucose 351-400 take 10 units [place] Eric J, DO  Expired 10/08/21 2359 Spouse/Significant Other  insulin glargine (LANTUS) 100 UNIT/ML Solostar Pen [id number] Yes Inject 20 Units into the skin daily. Darliss Cheney, MD Taking Active   Insulin Pen Needle (NOVOFINE PLUS PEN NEEDLE) 32G X 4 MM MISC [id number] Yes Use as directed with insulin pen [place] Donnamarie Poag, DO Taking Active Spouse/Significant Other  Lancets (ACCU-CHEK SOFT TOUCH) lancets [id number] Yes Use as instructed  Patient taking differently: 1 each by Other route as directed.   Tysinger, Camelia Eng, PA-C Taking Active   levETIRAcetam (KEPPRA) 500 MG tablet [id number]  Take 2 tablets (1,000 mg total) by mouth daily. [place] Donnamarie Poag, DO  Active Spouse/Significant Other  omeprazole (PRILOSEC) 40 MG capsule [id number]  Take 1 capsule (40 mg total) by mouth daily.  Patient taking differently: Take 40 mg by mouth daily as needed (heartburn/indigestion).   [place] Eric J, DO  Expired 10/08/21 2359 Spouse/Significant Other  QUEtiapine (SEROQUEL) 25 MG tablet [id number]  Take 1 tablet (25 mg total) by mouth at bedtime. [place] Donnamarie Poag, DO  Active Spouse/Significant Other  sertraline (ZOLOFT) 25 MG tablet [id number]  Take 1 tablet (25 mg total) by mouth daily. [place] Eric J, DO  Active Spouse/Significant Other  UNABLE TO FIND [id number] Yes Med Name:  Custom diabetic shoes  Dx: M76.72 Shamleffer, Melanie Crazier, MD Taking Active Spouse/Significant Other            Patient Active Problem List   Diagnosis Date Noted   Callus 10/19/2021   Enlarged and hypertrophic nails 10/19/2021   Open wound of toe 10/19/2021   Skin infection 10/19/2021   Mental health disorder 10/19/2021   Delirium 09/21/2021    Hypertensive emergency 09/14/2021   Hypoglycemia 09/18/2020   Polypharmacy 09/18/2020   Anemia 04/16/2020   Anxiety 04/16/2020   Depressed mood 04/16/2020   Type 2 diabetes mellitus with diabetic polyneuropathy, with long-term current use of insulin ([place]) 02/09/2020   Type 2 diabetes mellitus with stage 3a chronic kidney disease, with long-term current use of insulin ([place]) 02/09/2020   Family  history of premature CAD 01/27/2020   Stage 3b chronic kidney disease ([place]) 01/09/2020   Incontinence of feces 01/09/2020   Chronic pain of left knee 01/09/2020   Left leg pain 01/09/2020   Fall 01/09/2020   History of diverticulitis 10/30/2019   Noncompliance 06/10/2019   History of renal cell cancer 04/24/2018   Erectile dysfunction 04/24/2018   Edema 03/15/2018   Seizure disorder ([place]) 03/01/2018   History of stroke 11/29/2017   Obstructive sleep apnea 11/29/2017   Uncontrolled diabetes mellitus 05/10/2017   Dyslipidemia associated with type 2 diabetes mellitus ([place]) 05/10/2017   Hypertension associated with diabetes ([place]) 05/10/2017   History of cerebrovascular accident (CVA) with residual deficit 05/10/2017   Diabetic ulcer of left foot associated with secondary diabetes mellitus ([place]) 04/21/2016   History of seizure 01/05/2016   Depression, recurrent ([place]) 09/21/2014   Benign hypertension with CKD (chronic kidney disease) stage III ([place]) 12/30/2011   Side pain 12/30/2011    Immunization History  Administered Date(s) Administered   Influenza,inj,Quad PF,6+ Mos 11/18/2013, 07/29/2014, 10/19/2015, 08/15/2016, 02/05/2018, 12/24/2020, 10/19/2021   Moderna SARS-COV2 Booster Vaccination 07/07/2021   PFIZER(Purple Top)SARS-COV-2 Vaccination 03/14/2020, 04/21/2020, 12/24/2020   Pneumococcal Polysaccharide-23 08/15/2016   Td 10/19/2021   Tdap 08/15/2016    Conditions to be addressed/monitored:  Hypertension, Hyperlipidemia, Diabetes, GERD, Depression, Anxiety, BPH, and Seizures  Care  Plan : [place]  Updates made by Viona Gilmore, [place] since 11/18/2021 12:00 AM     Problem: Problem: Hypertension, Hyperlipidemia, Diabetes, GERD, Depression, Anxiety, BPH, and Seizures      Long-Range Goal: Patient-Specific Goal   Start Date: 11/18/2021  Expected End Date: 11/18/2022  This Visit's Progress: Not on track  Priority: High  Note:   Current Barriers:  Unable to independently monitor therapeutic efficacy Unable to achieve control of diabetes  Unable to self administer medications as prescribed  Pharmacist Clinical Goal(s):  Patient will achieve adherence to monitoring guidelines and medication adherence to achieve therapeutic efficacy achieve control of diabetes as evidenced by A1c  through collaboration with PharmD and provider.   Interventions: 1:1 collaboration with Tysinger, Camelia Eng, PA-C regarding development and update of comprehensive plan of care as evidenced by provider attestation and co-signature Inter-disciplinary care team collaboration (see longitudinal plan of care) Comprehensive medication review performed; medication list updated in electronic medical record  Hypertension (BP goal <130/80) -Not ideally controlled -Current treatment: Hydralazine 100 mg 1 tablet three times daily (makes him feel dizzy) -  Amlodipine 10 mg 1 tablet daily - in AM  -Medications previously tried: n/a  -Current home readings:  checking every other day - lately 128-130s; arm cuff and wrist cuff  -Current dietary habits: doesn't use salt; more sensitive; look at sodium in package labels; not often frozen meals  -Current exercise habits: not exercising -Denies hypotensive/hypertensive symptoms -Educated on BP goals and benefits of medications for prevention of heart attack, stroke and kidney damage; Importance of home blood pressure monitoring; Proper BP monitoring technique; Symptoms of hypotension and importance of maintaining adequate  hydration; -Counseled to monitor BP at home at least twice weekly, document, and provide log at future appointments -Counseled on diet and exercise extensively Recommended to continue current medication Recommended decreasing amlodipine given swelling and consider beta blocker for BP lowering.  Hyperlipidemia: (LDL goal < 70) -Uncontrolled -Current treatment: Ezetimibe 10 mg 1 tablet daily Atorvastatin 40 mg 1 tablet daily -Medications previously tried: none  -Current dietary patterns: trying to eat healthier -Current exercise habits: minimal -Educated on  Cholesterol goals;  Benefits of statin for ASCVD risk reduction; Importance of limiting foods high in cholesterol; -Counseled on diet and exercise extensively Recommended repeat lipid panel and consider escalating statin therapy.  History of stroke (Goal: prevent future strokes) -Uncontrolled -Current treatment  Atorvastatin 40 mg 1 tablet daily -Medications previously tried: aspirin (was told to stop due to CKD)  - Consider restarting aspirin therapy depending on results of BMET.   Diabetes (A1c goal <7%) -Uncontrolled -Current medications: Novolog inject 2-10 units three times daily Lantus inject 20 units daily -Medications previously tried: Ozempic (nausea/dropping too low?), Invokana,  metformin (GI upset) Januvia (helpful) -Current home glucose readings fasting glucose:  checking once a day (160s, 117, 119, 109) post prandial glucose: does not check -Denies hypoglycemic/hyperglycemic symptoms -Current meal patterns:  breakfast: skipping breakfast; ensure plus lunch: can of spaghetti  dinner: eats whatever he wants; sub from [place] [employer] (eating out somewhat often - she works late), [place] snacks: fruit cups and sugar free jello; not sure - likes fruits and vegetables drinks: lots of diet soda; sometimes cranberry juice (try cranberry pills); drinking more water; tea (parsley tea), and coffee -Current exercise:  minimal -Educated on A1c and blood sugar goals; Complications of diabetes including kidney damage, retinal damage, and cardiovascular disease; Exercise goal of 150 minutes per week; Benefits of routine self-monitoring of blood sugar; Continuous glucose monitoring; Carbohydrate counting and/or plate method -Counseled to check feet daily and get yearly eye exams -Recommended GLP1 therapy and decreasing insulin.  Depression/Anxiety (Goal: minimize symptoms) -Not ideally controlled -Current treatment: Quetiapine 25 mg 1 tablet at bedtime Sertraline 25 mg 1 tablet daily -Medications previously tried/failed: n/a -PHQ9: 17 -GAD7: n/a -Educated on Benefits of medication for symptom control Benefits of cognitive-behavioral therapy with or without medication -Recommended to continue current medication Will reach out to social worker to see if he can get set up with covered psychiatrist.  Seizures (Goal: prevent seizures) -Uncontrolled -Current treatment  Levetiracetam 500 mg 2 tablets daily -Medications previously tried: n/a  -Recommended repeat keppra level.  Health Maintenance -Vaccine gaps: shingrix, Prevnar, COVID booster -Current therapy:  None -Educated on Cost vs benefit of each product must be carefully weighed by individual consumer -Patient is satisfied with current therapy and denies issues -Recommended to continue as is.  Patient Goals/Self-Care Activities Patient will:  - take medications as prescribed as evidenced by patient report and record review check glucose daily, document, and provide at future appointments check blood pressure weekly, document, and provide at future appointments target a minimum of 150 minutes of moderate intensity exercise weekly  Follow Up Plan: Next PCP appointment scheduled for: 1 week        Follow Up Plan: Next PCP appointment scheduled for:  1 week  Medication Assistance: None required.  Patient affirms current coverage meets  needs.  Compliance/Adherence/Medication fill history: Care Gaps: Hepatitis Screening - Overdue Zoster Vaccine - Overdue Eye Exam - Overdue PNA Vaccine - Overdue Urine Micro - Overdue COVID Booster - Overdue  Star-Rating Drugs: Atorvastatin (Lipitor) 40 mg - [facility]10/21 [facility]  [facility]  [id number] [place] [address]Phone:: [phone]:: [phone] Uses pill box? No - wife separate them (pillbox doesn't work - too big) Pt endorses 80% compliance  - he does miss doses  We discussed: Benefits of medication synchronization, packaging and delivery as well as enhanced pharmacist oversight with [facility]. Patient decided to:  Continue current medication management strategy  Care Plan and Follow Up Patient Decision:  Patient agrees to Care Plan and Follow-up.  Plan: The patient will call pharmacist/office as advised to when he has questions.   Jeni Salles, PharmD, [facility] [phone]

## 2021-11-15 ENCOUNTER — Telehealth: Payer: Self-pay

## 2021-11-15 NOTE — Telephone Encounter (Signed)
Patient called and wanted to let pharmacist know that he would like to keep his current pharmacy at [place] on [address]

## 2021-11-18 ENCOUNTER — Telehealth: Payer: Self-pay | Admitting: Medical

## 2021-11-18 ENCOUNTER — Ambulatory Visit: Payer: Medicare Other | Admitting: Medical

## 2021-11-18 NOTE — Patient Instructions (Signed)
Hi Brandon [name] and Brandon [name],  It was great to get to meet you in person! Below is a summary of some of the topics we discussed.   Please reach out to me if you have any questions or need anything!  Best, Maddie  Jeni Salles, PharmD, [place] [phone]   Visit Information   Goals Addressed   None    Patient Care Plan: CCM Pharmacy Care Plan     Problem Identified: Problem: Hypertension, Hyperlipidemia, Diabetes, GERD, Depression, Anxiety, BPH, and Seizures      Long-Range Goal: Patient-Specific Goal   Start Date: 11/18/2021  Expected End Date: 11/18/2022  This Visit's Progress: Not on track  Priority: High  Note:   Current Barriers:  Unable to independently monitor therapeutic efficacy Unable to achieve control of diabetes  Unable to self administer medications as prescribed  Pharmacist Clinical Goal(s):  Patient will achieve adherence to monitoring guidelines and medication adherence to achieve therapeutic efficacy achieve control of diabetes as evidenced by A1c  through collaboration with PharmD and provider.   Interventions: 1:1 collaboration with Tysinger, Camelia Eng, PA-C regarding development and update of comprehensive plan of care as evidenced by provider attestation and co-signature Inter-disciplinary care team collaboration (see longitudinal plan of care) Comprehensive medication review performed; medication list updated in electronic medical record  Hypertension (BP goal <130/80) -Not ideally controlled -Current treatment: Hydralazine 100 mg 1 tablet three times daily (makes him feel dizzy) -  Amlodipine 10 mg 1 tablet daily - in AM  -Medications previously tried: n/a  -Current home readings:  checking every other day - lately 128-130s; arm cuff and wrist cuff  -Current dietary habits: doesn't use salt; more sensitive; look at sodium in package labels; not often frozen meals  -Current exercise habits: not  exercising -Denies hypotensive/hypertensive symptoms -Educated on BP goals and benefits of medications for prevention of heart attack, stroke and kidney damage; Importance of home blood pressure monitoring; Proper BP monitoring technique; Symptoms of hypotension and importance of maintaining adequate hydration; -Counseled to monitor BP at home at least twice weekly, document, and provide log at future appointments -Counseled on diet and exercise extensively Recommended to continue current medication Recommended decreasing amlodipine given swelling and consider beta blocker for BP lowering.  Hyperlipidemia: (LDL goal < 70) -Uncontrolled -Current treatment: Ezetimibe 10 mg 1 tablet daily Atorvastatin 40 mg 1 tablet daily -Medications previously tried: none  -Current dietary patterns: trying to eat healthier -Current exercise habits: minimal -Educated on Cholesterol goals;  Benefits of statin for ASCVD risk reduction; Importance of limiting foods high in cholesterol; -Counseled on diet and exercise extensively Recommended repeat lipid panel and consider escalating statin therapy.  History of stroke (Goal: prevent future strokes) -Uncontrolled -Current treatment  Atorvastatin 40 mg 1 tablet daily -Medications previously tried: aspirin (was told to stop due to CKD)  - Consider restarting aspirin therapy depending on results of BMET.   Diabetes (A1c goal <7%) -Uncontrolled -Current medications: Novolog inject 2-10 units three times daily Lantus inject 20 units daily -Medications previously tried: Ozempic (nausea/dropping too low?), Invokana,  metformin (GI upset) Januvia (helpful) -Current home glucose readings fasting glucose:  checking once a day (160s, 117, 119, 109) post prandial glucose: does not check -Denies hypoglycemic/hyperglycemic symptoms -Current meal patterns:  breakfast: skipping breakfast; ensure plus lunch: can of spaghetti  dinner: eats whatever he wants; sub  from [place] [employer] (eating out somewhat often - she works late), [place] snacks: fruit cups and sugar free jello; not  sure - likes fruits and vegetables drinks: lots of diet soda; sometimes cranberry juice (try cranberry pills); drinking more water; tea (parsley tea), and coffee -Current exercise: minimal -Educated on A1c and blood sugar goals; Complications of diabetes including kidney damage, retinal damage, and cardiovascular disease; Exercise goal of 150 minutes per week; Benefits of routine self-monitoring of blood sugar; Continuous glucose monitoring; Carbohydrate counting and/or plate method -Counseled to check feet daily and get yearly eye exams -Recommended GLP1 therapy and decreasing insulin.  Depression/Anxiety (Goal: minimize symptoms) -Not ideally controlled -Current treatment: Quetiapine 25 mg 1 tablet at bedtime Sertraline 25 mg 1 tablet daily -Medications previously tried/failed: n/a -PHQ9: 17 -GAD7: n/a -Educated on Benefits of medication for symptom control Benefits of cognitive-behavioral therapy with or without medication -Recommended to continue current medication Will reach out to social worker to see if he can get set up with covered psychiatrist.  Seizures (Goal: prevent seizures) -Uncontrolled -Current treatment  Levetiracetam 500 mg 2 tablets daily -Medications previously tried: n/a  -Recommended repeat keppra level.  Health Maintenance -Vaccine gaps: shingrix, Prevnar, COVID booster -Current therapy:  None -Educated on Cost vs benefit of each product must be carefully weighed by individual consumer -Patient is satisfied with current therapy and denies issues -Recommended to continue as is.  Patient Goals/Self-Care Activities Patient will:  - take medications as prescribed as evidenced by patient report and record review check glucose daily, document, and provide at future appointments check blood pressure weekly, document, and provide at  future appointments target a minimum of 150 minutes of moderate intensity exercise weekly  Follow Up Plan: Next PCP appointment scheduled for: 1 week      Brandon [name] was given information about Chronic Care Management services today including:  CCM service includes personalized support from designated clinical staff supervised by his physician, including individualized plan of care and coordination with other care providers 24/7 contact phone numbers for assistance for urgent and routine care needs. Standard insurance, coinsurance, copays and deductibles apply for chronic care management only during months in which we provide at least 20 minutes of these services. Most insurances cover these services at 100%, however patients may be responsible for any copay, coinsurance and/or deductible if applicable. This service may help you avoid the need for more expensive face-to-face services. Only one practitioner may furnish and bill the service in a calendar month. The patient may stop CCM services at any time (effective at the end of the month) by phone call to the office staff.  Patient agreed to services and verbal consent obtained.   The patient verbalized understanding of instructions, educational materials, and care plan provided today and agreed to receive a mailed copy of patient instructions, educational materials, and care plan.  The pharmacy team will reach out to the patient again over the next 30 days.   Viona Gilmore, [facility]

## 2021-11-18 NOTE — Progress Notes (Signed)
Subjective: No chief complaint on file.  Precharting:      Medical team: Dr. Collier Flowers, endocrionlogy Dr. Rex Kras, cardiology Dr. Walden Cellar, GI Evlyn Courier, NP and Dr. Star Age and Dr. Antony Contras, neurology Dr. Joni Fears, Ortho Jeni Salles, [facility] Dr. Ellison Hughs, Urology Dr. Harrie Jeans, nephrology podiatry Rumeal Cullipher, Camelia Eng, PA-C here for primary care   [name] has a medical history consistent with history of stroke, history of seizure, hypertension, diabetes, dyslipidemia, obstructive sleep apnea, history of renal cell cancer, stage III CKD, erectile dysfunction, edema, anemia, depression and anxiety, falls, and noncompliance and medication nonadherence  Concerns:  He recently had a chronic disease management med review visit with pharmacist Jeni Salles.  She had several recommendations which we will discuss today  Pharmacist suggestions: Lower amlodipine dose Edema? Lipid fasting? Keppra level Med adherence packagaing Dexcom?    Past Medical History:  Diagnosis Date   Acute ischemic stroke ([place]) 11/2013   Allergy    Anxiety    Arthritis    Benign hypertension with CKD (chronic kidney disease) stage III (HCC)    Bil Renal Ca dx'd 09/2011 & 11/2011   left and right; cryoablation bil   CVA (cerebral vascular accident) ([place])    stroke   Depression    BH Adm in [place] Depression   Diabetes mellitus    DKA prior hospitalization   Diverticulitis    s/p micorperforation Sept 2012-managed conservatively by Gen surgery   ED (erectile dysfunction)    Focal seizure ([place]) 11/2013   due to ischemic stroke   GERD (gastroesophageal reflux disease)    Hiatal hernia    Hyperlipidemia    Hypertension    Kidney tumor 09/2011   Renal cell CA   Seizures ([place])    none since 2016, taking Keppra - maw   Sleep apnea    Thyroid disease    "weak thyroid" per MD   Wears glasses    Current Outpatient Medications on File  Prior to Visit  Medication Sig Dispense Refill   amLODipine (NORVASC) 10 MG tablet TAKE 1 TABLET BY MOUTH EVERY DAY 30 tablet 0   atorvastatin (LIPITOR) 40 MG tablet Take 1 tablet (40 mg total) by mouth daily. 30 tablet 1   blood glucose meter kit and supplies Dispense based on patient and insurance preference. Use up to four times daily as directed. (FOR ICD-9 250.00, 250.01). (Patient taking differently: 1 each by Other route See admin instructions. Dispense based on patient and insurance preference. Use up to four times daily as directed. (FOR ICD-9 250.00, 250.01).) 1 each 0   ezetimibe (ZETIA) 10 MG tablet Take 1 tablet (10 mg total) by mouth daily. 30 tablet 1   glucose blood (ACCU-CHEK ACTIVE STRIPS) test strip Use as instructed (Patient taking differently: 1 each by Other route as directed.) 100 each 12   glucose blood test strip accu chek active 1-2 times daily (Patient taking differently: 1 each by Other route See admin instructions. accu chek active 1-2 times daily) 100 each 12   hydrALAZINE (APRESOLINE) 100 MG tablet Take 1 tablet (100 mg total) by mouth every 8 (eight) hours. 90 tablet 0   insulin aspart (NOVOLOG) 100 UNIT/ML FlexPen Inject 2-10 Units into the skin 3 (three) times daily with meals. If glucose 150-200 take 2 units, glucose 201-250 take 4 units, 251-300 take 6 units, glucose 301-350 take 8 units, glucose 351-400 take 10 units 15 mL 3   insulin glargine (LANTUS) 100 UNIT/ML Solostar  Pen Inject 20 Units into the skin daily. (Patient taking differently: Inject 40 Units into the skin daily.) 6 mL 2   Insulin Pen Needle (NOVOFINE PLUS PEN NEEDLE) 32G X 4 MM MISC Use as directed with insulin pen 100 each 2   Lancets (ACCU-CHEK SOFT TOUCH) lancets Use as instructed (Patient taking differently: 1 each by Other route as directed.) 100 each 12   levETIRAcetam (KEPPRA) 500 MG tablet Take 2 tablets (1,000 mg total) by mouth daily. 60 tablet 1   omeprazole (PRILOSEC) 40 MG capsule Take 1  capsule (40 mg total) by mouth daily. (Patient taking differently: Take 40 mg by mouth daily as needed (heartburn/indigestion).) 30 capsule 2   QUEtiapine (SEROQUEL) 25 MG tablet Take 1 tablet (25 mg total) by mouth at bedtime. 30 tablet 2   sertraline (ZOLOFT) 25 MG tablet Take 1 tablet (25 mg total) by mouth daily. 30 tablet 2   UNABLE TO FIND Med Name:  Custom diabetic shoes  Dx: C14.48 1 Device 0   No current facility-administered medications on file prior to visit.        Echocardiogram March 2021 showing normal LV systolic function, EF 60 to 65%, no regional wall motion abnormalities, moderate LVH.  Mild mitral and tricuspid regurgitation.  ABI normal May 2017    Assessment: Encounter Diagnoses  Name Primary?   Type 2 diabetes mellitus with stage 3a chronic kidney disease, with long-term current use of insulin (HCC) Yes   Type 2 diabetes mellitus with diabetic polyneuropathy, with long-term current use of insulin (HCC)    Stage 3b chronic kidney disease (HCC)    Seizure disorder (HCC)    Polypharmacy    Obstructive sleep apnea    Noncompliance    Mental health disorder    Hypertension associated with diabetes ([place])    History of stroke    History of renal cell cancer    History of cerebrovascular accident (CVA) with residual deficit    Edema, unspecified type    Dyslipidemia associated with type 2 diabetes mellitus (HCC)    Depression, recurrent (HCC)    Anemia, unspecified type    Anxiety       Plan:   Diabetes   Hypertension   Chronic kidney disease   Edema   Dyslipidemia   History of stroke   History of renal cell carcinoma   History of mental health disorder, depression, anxiety-in recent years we have made referrals to psychiatry on numerous occasions.  Either he does not keep the appointments or does not make the appointments or no-shows.  Nevertheless we made another referral December 2 to psychiatry/mental health.   Vaccine  counseling Immunization History  Administered Date(s) Administered   Influenza,inj,Quad PF,6+ Mos 11/18/2013, 07/29/2014, 10/19/2015, 08/15/2016, 02/05/2018, 12/24/2020, 10/19/2021   Moderna SARS-COV2 Booster Vaccination 07/07/2021   PFIZER(Purple Top)SARS-COV-2 Vaccination 03/14/2020, 04/21/2020, 12/24/2020   Pneumococcal Polysaccharide-23 08/15/2016   Td 10/19/2021   Tdap 08/15/2016   I recommend an updated Prevnar 20 vaccine pneumonia vaccine  I recommend the shingles vaccine, 2 doses 2 months apart

## 2021-11-18 NOTE — Telephone Encounter (Signed)
As bad as I hate to do this, I recommend dismissing this patient  He has had a pattern of cancellations last-minute or no-shows for several years at our office and other offices.  He has failed to follow-up when we have made referrals to other providers multiple times.  He is not adherent to his medication regimen.  He often runs out of medications before letting anybody know that he needs refills  He has not followed up with his endocrinologist in over a year and his diabetes is out of control.  He had a recent emergency clinic visit for hypertensive crisis  We have attempted multiple times to refer him to specialist.  We recently got the pharmacist involved to do a med review to help get things under better control.  He was supposed to come in today for follow-up and med check and acute concerns but no showed  So unfortunately I think it is time to dismiss this patient

## 2021-11-22 ENCOUNTER — Encounter: Payer: Self-pay | Admitting: Family Medicine

## 2021-11-28 DIAGNOSIS — R0689 Other abnormalities of breathing: Secondary | ICD-10-CM | POA: Diagnosis not present

## 2021-11-28 DIAGNOSIS — R739 Hyperglycemia, unspecified: Secondary | ICD-10-CM | POA: Diagnosis not present

## 2021-11-28 DIAGNOSIS — R404 Transient alteration of awareness: Secondary | ICD-10-CM | POA: Diagnosis not present

## 2021-11-28 DIAGNOSIS — R000 Tachycardia, unspecified: Secondary | ICD-10-CM | POA: Diagnosis not present

## 2021-11-28 DIAGNOSIS — Z743 Need for continuous supervision: Secondary | ICD-10-CM | POA: Diagnosis not present

## 2021-11-29 ENCOUNTER — Encounter (HOSPITAL_COMMUNITY): Payer: Self-pay | Admitting: Emergency Medicine

## 2021-11-29 ENCOUNTER — Inpatient Hospital Stay (HOSPITAL_COMMUNITY)
Admission: EM | Admit: 2021-11-29 | Discharge: 2021-12-04 | DRG: 682 | Disposition: A | Discharge status: Home Health | Payer: Medicare Other | Attending: Internal Medicine | Admitting: Internal Medicine

## 2021-11-29 ENCOUNTER — Observation Stay (HOSPITAL_COMMUNITY): Payer: Medicare Other

## 2021-11-29 ENCOUNTER — Other Ambulatory Visit: Payer: Self-pay

## 2021-11-29 ENCOUNTER — Emergency Department (HOSPITAL_COMMUNITY): Payer: Medicare Other

## 2021-11-29 DIAGNOSIS — E785 Hyperlipidemia, unspecified: Secondary | ICD-10-CM | POA: Diagnosis not present

## 2021-11-29 DIAGNOSIS — D649 Anemia, unspecified: Secondary | ICD-10-CM | POA: Diagnosis present

## 2021-11-29 DIAGNOSIS — G9340 Encephalopathy, unspecified: Secondary | ICD-10-CM | POA: Diagnosis not present

## 2021-11-29 DIAGNOSIS — F419 Anxiety disorder, unspecified: Secondary | ICD-10-CM | POA: Diagnosis present

## 2021-11-29 DIAGNOSIS — R4701 Aphasia: Secondary | ICD-10-CM | POA: Diagnosis present

## 2021-11-29 DIAGNOSIS — R29704 NIHSS score 4: Secondary | ICD-10-CM | POA: Diagnosis present

## 2021-11-29 DIAGNOSIS — N179 Acute kidney failure, unspecified: Principal | ICD-10-CM | POA: Diagnosis present

## 2021-11-29 DIAGNOSIS — G4733 Obstructive sleep apnea (adult) (pediatric): Secondary | ICD-10-CM | POA: Diagnosis present

## 2021-11-29 DIAGNOSIS — G4730 Sleep apnea, unspecified: Secondary | ICD-10-CM | POA: Diagnosis present

## 2021-11-29 DIAGNOSIS — Z833 Family history of diabetes mellitus: Secondary | ICD-10-CM

## 2021-11-29 DIAGNOSIS — Z79899 Other long term (current) drug therapy: Secondary | ICD-10-CM

## 2021-11-29 DIAGNOSIS — F32A Depression, unspecified: Secondary | ICD-10-CM | POA: Diagnosis present

## 2021-11-29 DIAGNOSIS — I6930 Unspecified sequelae of cerebral infarction: Secondary | ICD-10-CM | POA: Diagnosis not present

## 2021-11-29 DIAGNOSIS — Z7982 Long term (current) use of aspirin: Secondary | ICD-10-CM

## 2021-11-29 DIAGNOSIS — D631 Anemia in chronic kidney disease: Secondary | ICD-10-CM | POA: Diagnosis not present

## 2021-11-29 DIAGNOSIS — K219 Gastro-esophageal reflux disease without esophagitis: Secondary | ICD-10-CM | POA: Diagnosis present

## 2021-11-29 DIAGNOSIS — Z794 Long term (current) use of insulin: Secondary | ICD-10-CM

## 2021-11-29 DIAGNOSIS — E1142 Type 2 diabetes mellitus with diabetic polyneuropathy: Secondary | ICD-10-CM | POA: Diagnosis present

## 2021-11-29 DIAGNOSIS — G40909 Epilepsy, unspecified, not intractable, without status epilepticus: Secondary | ICD-10-CM | POA: Diagnosis not present

## 2021-11-29 DIAGNOSIS — E1122 Type 2 diabetes mellitus with diabetic chronic kidney disease: Secondary | ICD-10-CM | POA: Diagnosis present

## 2021-11-29 DIAGNOSIS — N2889 Other specified disorders of kidney and ureter: Secondary | ICD-10-CM | POA: Diagnosis present

## 2021-11-29 DIAGNOSIS — E1129 Type 2 diabetes mellitus with other diabetic kidney complication: Secondary | ICD-10-CM | POA: Diagnosis not present

## 2021-11-29 DIAGNOSIS — E8779 Other fluid overload: Secondary | ICD-10-CM | POA: Diagnosis not present

## 2021-11-29 DIAGNOSIS — I161 Hypertensive emergency: Secondary | ICD-10-CM | POA: Diagnosis present

## 2021-11-29 DIAGNOSIS — N1832 Chronic kidney disease, stage 3b: Secondary | ICD-10-CM | POA: Diagnosis not present

## 2021-11-29 DIAGNOSIS — E8770 Fluid overload, unspecified: Secondary | ICD-10-CM | POA: Diagnosis present

## 2021-11-29 DIAGNOSIS — G9341 Metabolic encephalopathy: Secondary | ICD-10-CM

## 2021-11-29 DIAGNOSIS — E876 Hypokalemia: Secondary | ICD-10-CM | POA: Diagnosis not present

## 2021-11-29 DIAGNOSIS — I214 Non-ST elevation (NSTEMI) myocardial infarction: Secondary | ICD-10-CM | POA: Diagnosis not present

## 2021-11-29 DIAGNOSIS — G319 Degenerative disease of nervous system, unspecified: Secondary | ICD-10-CM | POA: Diagnosis not present

## 2021-11-29 DIAGNOSIS — G8194 Hemiplegia, unspecified affecting left nondominant side: Secondary | ICD-10-CM | POA: Diagnosis present

## 2021-11-29 DIAGNOSIS — R410 Disorientation, unspecified: Secondary | ICD-10-CM

## 2021-11-29 DIAGNOSIS — I152 Hypertension secondary to endocrine disorders: Secondary | ICD-10-CM | POA: Diagnosis not present

## 2021-11-29 DIAGNOSIS — N184 Chronic kidney disease, stage 4 (severe): Secondary | ICD-10-CM | POA: Diagnosis present

## 2021-11-29 DIAGNOSIS — R569 Unspecified convulsions: Secondary | ICD-10-CM | POA: Diagnosis present

## 2021-11-29 DIAGNOSIS — E1169 Type 2 diabetes mellitus with other specified complication: Secondary | ICD-10-CM | POA: Diagnosis not present

## 2021-11-29 DIAGNOSIS — Z885 Allergy status to narcotic agent status: Secondary | ICD-10-CM

## 2021-11-29 DIAGNOSIS — E43 Unspecified severe protein-calorie malnutrition: Secondary | ICD-10-CM | POA: Insufficient documentation

## 2021-11-29 DIAGNOSIS — Z6833 Body mass index (BMI) 33.0-33.9, adult: Secondary | ICD-10-CM

## 2021-11-29 DIAGNOSIS — E1165 Type 2 diabetes mellitus with hyperglycemia: Secondary | ICD-10-CM | POA: Diagnosis present

## 2021-11-29 DIAGNOSIS — I129 Hypertensive chronic kidney disease with stage 1 through stage 4 chronic kidney disease, or unspecified chronic kidney disease: Secondary | ICD-10-CM | POA: Diagnosis present

## 2021-11-29 DIAGNOSIS — Z20822 Contact with and (suspected) exposure to covid-19: Secondary | ICD-10-CM | POA: Diagnosis present

## 2021-11-29 DIAGNOSIS — G9389 Other specified disorders of brain: Secondary | ICD-10-CM | POA: Diagnosis not present

## 2021-11-29 DIAGNOSIS — I639 Cerebral infarction, unspecified: Secondary | ICD-10-CM | POA: Diagnosis not present

## 2021-11-29 DIAGNOSIS — Z9114 Patient's other noncompliance with medication regimen: Secondary | ICD-10-CM

## 2021-11-29 DIAGNOSIS — R4182 Altered mental status, unspecified: Secondary | ICD-10-CM | POA: Diagnosis not present

## 2021-11-29 DIAGNOSIS — Z8249 Family history of ischemic heart disease and other diseases of the circulatory system: Secondary | ICD-10-CM

## 2021-11-29 DIAGNOSIS — I169 Hypertensive crisis, unspecified: Secondary | ICD-10-CM | POA: Diagnosis not present

## 2021-11-29 DIAGNOSIS — Z85528 Personal history of other malignant neoplasm of kidney: Secondary | ICD-10-CM

## 2021-11-29 DIAGNOSIS — E1159 Type 2 diabetes mellitus with other circulatory complications: Secondary | ICD-10-CM

## 2021-11-29 DIAGNOSIS — E119 Type 2 diabetes mellitus without complications: Secondary | ICD-10-CM

## 2021-11-29 DIAGNOSIS — E669 Obesity, unspecified: Secondary | ICD-10-CM | POA: Diagnosis present

## 2021-11-29 DIAGNOSIS — E11649 Type 2 diabetes mellitus with hypoglycemia without coma: Secondary | ICD-10-CM | POA: Diagnosis not present

## 2021-11-29 DIAGNOSIS — R29818 Other symptoms and signs involving the nervous system: Secondary | ICD-10-CM | POA: Diagnosis not present

## 2021-11-29 LAB — CBC
HCT: 44.4 % (ref 39.0–52.0)
Hemoglobin: 16.1 g/dL (ref 13.0–17.0)
MCH: 29.9 pg (ref 26.0–34.0)
MCHC: 36.3 g/dL — ABNORMAL HIGH (ref 30.0–36.0)
MCV: 82.5 fL (ref 80.0–100.0)
Platelets: 145 10*3/uL — ABNORMAL LOW (ref 150–400)
RBC: 5.38 MIL/uL (ref 4.22–5.81)
RDW: 11.6 % (ref 11.5–15.5)
WBC: 5.1 10*3/uL (ref 4.0–10.5)
nRBC: 0 % (ref 0.0–0.2)

## 2021-11-29 LAB — I-STAT CHEM 8, ED
BUN: 23 mg/dL — ABNORMAL HIGH (ref 6–20)
Calcium, Ion: 1.17 mmol/L (ref 1.15–1.40)
Chloride: 102 mmol/L (ref 98–111)
Creatinine, Ser: 3.4 mg/dL — ABNORMAL HIGH (ref 0.61–1.24)
Glucose, Bld: 230 mg/dL — ABNORMAL HIGH (ref 70–99)
HCT: 18 % — ABNORMAL LOW (ref 39.0–52.0)
Hemoglobin: 6.1 g/dL — CL (ref 13.0–17.0)
Potassium: 2.8 mmol/L — ABNORMAL LOW (ref 3.5–5.1)
Sodium: 141 mmol/L (ref 135–145)
TCO2: 27 mmol/L (ref 22–32)

## 2021-11-29 LAB — DIFFERENTIAL
Abs Immature Granulocytes: 0.02 10*3/uL (ref 0.00–0.07)
Basophils Absolute: 0 10*3/uL (ref 0.0–0.1)
Basophils Relative: 0 %
Eosinophils Absolute: 0.1 10*3/uL (ref 0.0–0.5)
Eosinophils Relative: 1 %
Immature Granulocytes: 0 %
Lymphocytes Relative: 16 %
Lymphs Abs: 0.8 10*3/uL (ref 0.7–4.0)
Monocytes Absolute: 0.3 10*3/uL (ref 0.1–1.0)
Monocytes Relative: 5 %
Neutro Abs: 3.9 10*3/uL (ref 1.7–7.7)
Neutrophils Relative %: 78 %

## 2021-11-29 LAB — MAGNESIUM: Magnesium: 1.8 mg/dL (ref 1.7–2.4)

## 2021-11-29 LAB — COMPREHENSIVE METABOLIC PANEL
ALT: 15 U/L (ref 0–44)
AST: 18 U/L (ref 15–41)
Albumin: 3.1 g/dL — ABNORMAL LOW (ref 3.5–5.0)
Alkaline Phosphatase: 136 U/L — ABNORMAL HIGH (ref 38–126)
Anion gap: 11 (ref 5–15)
BUN: 21 mg/dL — ABNORMAL HIGH (ref 6–20)
CO2: 23 mmol/L (ref 22–32)
Calcium: 8.5 mg/dL — ABNORMAL LOW (ref 8.9–10.3)
Chloride: 105 mmol/L (ref 98–111)
Creatinine, Ser: 3.38 mg/dL — ABNORMAL HIGH (ref 0.61–1.24)
GFR, Estimated: 20 mL/min — ABNORMAL LOW (ref >60)
Glucose, Bld: 228 mg/dL — ABNORMAL HIGH (ref 70–99)
Potassium: 2.8 mmol/L — ABNORMAL LOW (ref 3.5–5.1)
Sodium: 139 mmol/L (ref 135–145)
Total Bilirubin: 1.1 mg/dL (ref 0.3–1.2)
Total Protein: 6.5 g/dL (ref 6.5–8.1)

## 2021-11-29 LAB — GLUCOSE, CAPILLARY: Glucose-Capillary: 89 mg/dL (ref 70–99)

## 2021-11-29 LAB — RESP PANEL BY RT-PCR (FLU A&B, COVID) ARPGX2
Influenza A by PCR: NEGATIVE
Influenza B by PCR: NEGATIVE
SARS Coronavirus 2 by RT PCR: NEGATIVE

## 2021-11-29 LAB — CBG MONITORING, ED
Glucose-Capillary: 128 mg/dL — ABNORMAL HIGH (ref 70–99)
Glucose-Capillary: 139 mg/dL — ABNORMAL HIGH (ref 70–99)
Glucose-Capillary: 158 mg/dL — ABNORMAL HIGH (ref 70–99)
Glucose-Capillary: 413 mg/dL — ABNORMAL HIGH (ref 70–99)

## 2021-11-29 LAB — TROPONIN I (HIGH SENSITIVITY)
Troponin I (High Sensitivity): 29 ng/L — ABNORMAL HIGH (ref <18)
Troponin I (High Sensitivity): 38 ng/L — ABNORMAL HIGH (ref <18)

## 2021-11-29 LAB — PROTIME-INR
INR: 1.2 (ref 0.8–1.2)
Prothrombin Time: 15.1 seconds (ref 11.4–15.2)

## 2021-11-29 LAB — APTT: aPTT: 28 seconds (ref 24–36)

## 2021-11-29 MED ORDER — INSULIN ASPART 100 UNIT/ML IJ SOLN
0.0000 [IU] | Freq: Three times a day (TID) | INTRAMUSCULAR | Status: DC
  Administered 2021-11-29: 19:00:00 2 [IU] via SUBCUTANEOUS
  Administered 2021-11-30: 12:00:00 3 [IU] via SUBCUTANEOUS
  Administered 2021-11-30: 19:00:00 5 [IU] via SUBCUTANEOUS
  Administered 2021-12-02 – 2021-12-04 (×2): 3 [IU] via SUBCUTANEOUS
  Administered 2021-12-04: 2 [IU] via SUBCUTANEOUS

## 2021-11-29 MED ORDER — SODIUM CHLORIDE 0.9 % IV SOLN: INTRAVENOUS | Status: DC

## 2021-11-29 MED ORDER — ACETAMINOPHEN 650 MG RE SUPP: 650.0000 mg | RECTAL | Status: DC | PRN

## 2021-11-29 MED ORDER — ACETAMINOPHEN 160 MG/5ML PO SOLN: 650.0000 mg | ORAL | Status: DC | PRN

## 2021-11-29 MED ORDER — INSULIN GLARGINE-YFGN 100 UNIT/ML ~~LOC~~ SOLN
40.0000 [IU] | Freq: Every day | SUBCUTANEOUS | Status: DC
  Administered 2021-11-29 – 2021-12-01 (×3): 40 [IU] via SUBCUTANEOUS
  Filled 2021-11-29 (×4): qty 0.4

## 2021-11-29 MED ORDER — HYDRALAZINE HCL 20 MG/ML IJ SOLN: 5.0000 mg | INTRAMUSCULAR | Status: DC | PRN

## 2021-11-29 MED ORDER — SERTRALINE HCL 50 MG PO TABS
25.0000 mg | ORAL_TABLET | Freq: Every day | ORAL | Status: DC
  Administered 2021-11-29 – 2021-12-04 (×6): 25 mg via ORAL
  Filled 2021-11-29 (×6): qty 1

## 2021-11-29 MED ORDER — LABETALOL HCL 5 MG/ML IV SOLN
20.0000 mg | Freq: Once | INTRAVENOUS | Status: AC
  Administered 2021-11-29: 05:00:00 20 mg via INTRAVENOUS
  Filled 2021-11-29: qty 4

## 2021-11-29 MED ORDER — SENNOSIDES-DOCUSATE SODIUM 8.6-50 MG PO TABS: 1.0000 | ORAL_TABLET | Freq: Every evening | ORAL | Status: DC | PRN

## 2021-11-29 MED ORDER — STROKE: EARLY STAGES OF RECOVERY BOOK
Freq: Once | Status: AC
  Filled 2021-11-29: qty 1

## 2021-11-29 MED ORDER — QUETIAPINE FUMARATE 25 MG PO TABS
25.0000 mg | ORAL_TABLET | Freq: Every day | ORAL | Status: DC
  Administered 2021-11-29 – 2021-12-03 (×5): 25 mg via ORAL
  Filled 2021-11-29 (×6): qty 1

## 2021-11-29 MED ORDER — INSULIN GLARGINE 100 UNIT/ML SOLOSTAR PEN: 40.0000 [IU] | PEN_INJECTOR | Freq: Every day | SUBCUTANEOUS | Status: DC

## 2021-11-29 MED ORDER — ATORVASTATIN CALCIUM 40 MG PO TABS
40.0000 mg | ORAL_TABLET | Freq: Every day | ORAL | Status: DC
  Administered 2021-11-29 – 2021-12-04 (×6): 40 mg via ORAL
  Filled 2021-11-29 (×6): qty 1

## 2021-11-29 MED ORDER — ACETAMINOPHEN 325 MG PO TABS: 650.0000 mg | ORAL_TABLET | ORAL | Status: DC | PRN

## 2021-11-29 MED ORDER — ENOXAPARIN SODIUM 40 MG/0.4ML IJ SOSY
40.0000 mg | PREFILLED_SYRINGE | INTRAMUSCULAR | Status: DC
  Administered 2021-11-30: 12:00:00 40 mg via SUBCUTANEOUS
  Filled 2021-11-29 (×2): qty 0.4

## 2021-11-29 MED ORDER — AMLODIPINE BESYLATE 5 MG PO TABS
10.0000 mg | ORAL_TABLET | Freq: Once | ORAL | Status: AC
  Administered 2021-11-29: 03:00:00 10 mg via ORAL
  Filled 2021-11-29: qty 2

## 2021-11-29 MED ORDER — ASPIRIN EC 81 MG PO TBEC
81.0000 mg | DELAYED_RELEASE_TABLET | Freq: Every day | ORAL | Status: DC
  Administered 2021-11-29 – 2021-12-04 (×5): 81 mg via ORAL
  Filled 2021-11-29 (×6): qty 1

## 2021-11-29 MED ORDER — HYDRALAZINE HCL 25 MG PO TABS
100.0000 mg | ORAL_TABLET | Freq: Once | ORAL | Status: AC
  Administered 2021-11-29: 03:00:00 100 mg via ORAL
  Filled 2021-11-29: qty 4

## 2021-11-29 MED ORDER — SODIUM CHLORIDE 0.9% FLUSH
3.0000 mL | Freq: Once | INTRAVENOUS | Status: AC
  Administered 2021-11-29: 01:00:00 3 mL via INTRAVENOUS

## 2021-11-29 MED ORDER — LEVETIRACETAM 500 MG PO TABS
500.0000 mg | ORAL_TABLET | Freq: Two times a day (BID) | ORAL | Status: DC
  Administered 2021-11-29 – 2021-12-04 (×11): 500 mg via ORAL
  Filled 2021-11-29 (×11): qty 1

## 2021-11-29 MED ORDER — POTASSIUM CHLORIDE CRYS ER 20 MEQ PO TBCR
40.0000 meq | EXTENDED_RELEASE_TABLET | Freq: Once | ORAL | Status: AC
Start: 2021-11-29 — End: 2021-11-29
  Administered 2021-11-29: 15:00:00 40 meq via ORAL
  Filled 2021-11-29: qty 2

## 2021-11-29 MED ORDER — INSULIN ASPART 100 UNIT/ML IJ SOLN
0.0000 [IU] | Freq: Every day | INTRAMUSCULAR | Status: DC
  Administered 2021-11-30 – 2021-12-03 (×3): 2 [IU] via SUBCUTANEOUS

## 2021-11-29 MED ORDER — EZETIMIBE 10 MG PO TABS
10.0000 mg | ORAL_TABLET | Freq: Every day | ORAL | Status: DC
  Administered 2021-11-29 – 2021-12-04 (×6): 10 mg via ORAL
  Filled 2021-11-29 (×6): qty 1

## 2021-11-29 MED ORDER — POTASSIUM CHLORIDE 10 MEQ/100ML IV SOLN
10.0000 meq | INTRAVENOUS | Status: AC
  Administered 2021-11-29 (×2): 10 meq via INTRAVENOUS
  Filled 2021-11-29 (×2): qty 100

## 2021-11-29 MED ORDER — LORAZEPAM 2 MG/ML IJ SOLN: 4.0000 mg | INTRAMUSCULAR | Status: DC | PRN

## 2021-11-29 NOTE — Assessment & Plan Note (Signed)
-  Markedly uncontrolled HTN while in the ER -He could have HTN crisis and/or PRES -BP has improved -Neurology recommends slowly lowering BP -Norvasc held -Will order prn IV hydralazine for SBP >180

## 2021-11-29 NOTE — Assessment & Plan Note (Signed)
-  Recent A1c was 8.4, indicating poor control -Continue glargine -Cover with moderate-scale SSI

## 2021-11-29 NOTE — Progress Notes (Signed)
EEG complete - results pending 

## 2021-11-29 NOTE — Assessment & Plan Note (Signed)
-  Repleted -Recheck BMP in AM

## 2021-11-29 NOTE — ED Provider Notes (Signed)
[facility] EMERGENCY DEPARTMENT Provider Note   CSN: [id number] Arrival date & time: 11/29/21  0015     History Chief Complaint  Patient presents with   Altered Mental Status    Brandon [name] is a 60 y.o. male.  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 Patient with no family at bedside. He is difficult to ascertain a history from. Complains of abdominal pain currently which has been present for "a while". Will not expand further on this. Denies N/V.  EMS reports LKN at [place] after which time patient became more agitated and confused. Was given 20 units of insulin for CBG in the 500's PTA. Apparently has had prior episodes of delirium associated with hypertensive urgency/emergency and hyperglycemia.  The history is provided by the patient and medical records. No language interpreter was used.  Altered Mental Status     Past Medical History:  Diagnosis Date   Acute ischemic stroke ([place]) 11/2013   Allergy    Anxiety    Arthritis    Benign hypertension with CKD (chronic kidney disease) stage III (HCC)    Bil Renal Ca dx'd 09/2011 & 11/2011   left and right; cryoablation bil   CVA (cerebral vascular accident) ([place])    stroke   Depression    BH Adm in [place] Depression   Diabetes mellitus    DKA prior hospitalization   Diverticulitis    s/p micorperforation Sept 2012-managed conservatively by Gen surgery   ED (erectile dysfunction)    Focal seizure ([place]) 11/2013   due to ischemic stroke   GERD (gastroesophageal reflux disease)    Hiatal hernia    Hyperlipidemia    Hypertension    Kidney tumor 09/2011   Renal cell CA   Seizures ([place])    none since 2016, taking Keppra - maw   Sleep apnea    Thyroid disease    "weak thyroid" per MD   Wears glasses     Patient Active Problem List    Diagnosis Date Noted   Callus 10/19/2021   Enlarged and hypertrophic nails 10/19/2021   Open wound of toe 10/19/2021   Mental health disorder 10/19/2021   Delirium 09/21/2021   Polypharmacy 09/18/2020   Anemia 04/16/2020   Anxiety 04/16/2020   Type 2 diabetes mellitus with diabetic polyneuropathy, with long-term current use of insulin ([place]) 02/09/2020   Type 2 diabetes mellitus with stage 3a chronic kidney disease, with long-term current use of insulin ([place]) 02/09/2020   Family history of premature CAD 01/27/2020   Stage 3b chronic kidney disease ([place]) 01/09/2020   Incontinence of feces 01/09/2020   Chronic pain of left knee 01/09/2020   Fall 01/09/2020   History of diverticulitis 10/30/2019   Noncompliance 06/10/2019   History of renal cell cancer 04/24/2018   Erectile dysfunction 04/24/2018   Edema 03/15/2018   Seizure disorder ([place]) 03/01/2018   History of stroke 11/29/2017   Obstructive sleep apnea 11/29/2017   Dyslipidemia associated with type 2 diabetes mellitus ([place]) 05/10/2017   Hypertension associated with diabetes ([place]) 05/10/2017   History of cerebrovascular accident (CVA) with residual deficit 05/10/2017   Diabetic ulcer of left foot associated with secondary diabetes mellitus ([place]) 04/21/2016   Depression, recurrent ([place]) 09/21/2014    Past Surgical History:  Procedure Laterality Date   COLONOSCOPY     IR RADIOLOGIST EVAL & MGMT  04/04/2017   KIDNEY SURGERY     ablation of renal cell CA - 12/28, prior one was October [place]   TEE WITHOUT CARDIOVERSION N/A 11/19/2013   Procedure: TRANSESOPHAGEAL ECHOCARDIOGRAM (TEE);  Surgeon: Lelon Perla, MD;  Location: [facility] ENDOSCOPY;  Service: Cardiovascular;  Laterality: N/A;       Family History  Problem Relation Age of Onset   Diabetes Father    Heart disease Father    Pneumonia Mother    Prostate cancer Other    Stomach cancer Other    Breast cancer Sister    Colon cancer Neg Hx    Esophageal cancer  Neg Hx    Rectal cancer Neg Hx     Social History   Tobacco Use   Smoking status: Never   Smokeless tobacco: Never  Vaping Use   Vaping Use: Never used  Substance Use Topics   Alcohol use: Not Currently    Comment: rarely   Drug use: No    Home Medications Prior to Admission medications   Medication Sig Start Date End Date Taking? Authorizing Provider  amLODipine (NORVASC) 10 MG tablet TAKE 1 TABLET BY MOUTH EVERY DAY Patient taking differently: Take 10 mg by mouth daily. 11/10/21  Yes Tysinger, Camelia Eng, PA-C  aspirin EC 81 MG tablet Take 81 mg by mouth daily. Swallow whole.   Yes [provider]  atorvastatin (LIPITOR) 40 MG tablet Take 1 tablet (40 mg total) by mouth daily. 10/19/21 01/17/22 Yes Tysinger, Camelia Eng, PA-C  ezetimibe (ZETIA) 10 MG tablet Take 1 tablet (10 mg total) by mouth daily. 10/19/21 01/17/22 Yes Tysinger, Camelia Eng, PA-C  hydrALAZINE (APRESOLINE) 100 MG tablet Take 1 tablet (100 mg total) by mouth every 8 (eight) hours. 09/21/21 11/29/21 Yes Pahwani, Einar Grad, MD  insulin aspart (NOVOLOG) 100 UNIT/ML FlexPen Inject 2-10 Units into the skin 3 (three) times daily with meals. If glucose 150-200 take 2 units, glucose 201-250 take 4 units, 251-300 take 6 units, glucose 301-350 take 8 units, glucose 351-400 take 10 units 07/10/21 11/29/21 Yes [place] Eric J, DO  insulin glargine (LANTUS) 100 UNIT/ML Solostar Pen Inject 20 Units into the skin daily. Patient taking differently: Inject 40 Units into the skin daily. 09/21/21 12/20/21 Yes Pahwani, Einar Grad, MD  levETIRAcetam (KEPPRA) 500 MG tablet Take 2 tablets (1,000 mg total) by mouth daily. Patient taking differently: Take 500 mg by mouth 2 (two) times daily. 10/19/21 01/17/22 Yes Tysinger, Camelia Eng, PA-C  omeprazole (PRILOSEC) 40 MG capsule Take 1 capsule (40 mg total) by mouth daily. Patient taking differently: Take 40 mg by mouth daily as needed (heartburn/indigestion). 07/10/21 11/29/21 Yes [place] Eric J, DO  QUEtiapine  (SEROQUEL) 25 MG tablet Take 1 tablet (25 mg total) by mouth at bedtime. 10/19/21 01/17/22 Yes Tysinger, Camelia Eng, PA-C  sertraline (ZOLOFT) 25 MG tablet Take 1 tablet (25 mg total) by mouth daily. 10/19/21 01/17/22 Yes Tysinger, Camelia Eng, PA-C  blood glucose meter kit and supplies Dispense based on patient and insurance preference. Use up to four times daily as directed. (FOR ICD-9 250.00, 250.01). Patient taking differently: 1 each by Other route See admin instructions. Dispense based on patient and insurance preference. Use up to four times daily as directed. (FOR ICD-9 250.00, 250.01). 04/24/16   Eugenie Filler, MD  glucose blood (ACCU-CHEK ACTIVE STRIPS) test strip Use as instructed Patient taking differently: 1 each by Other route as directed. 06/11/19   Tysinger, Camelia Eng, PA-C  glucose blood test strip accu chek  active 1-2 times daily Patient taking differently: 1 each by Other route See admin instructions. accu chek active 1-2 times daily 01/17/20   Tysinger, Camelia Eng, PA-C  Insulin Pen Needle (NOVOFINE PLUS PEN NEEDLE) 32G X 4 MM MISC Use as directed with insulin pen 07/10/21   [place] Donnamarie Poag, DO  Lancets (ACCU-CHEK SOFT TOUCH) lancets Use as instructed Patient taking differently: 1 each by Other route as directed. 06/11/19   Tysinger, Camelia Eng, PA-C  UNABLE TO FIND Med Name:  Custom diabetic shoes  Dx: E11.42 Patient not taking: Reported on 11/29/2021 05/14/20   Shamleffer, Melanie Crazier, MD    Allergies    Morphine  Review of Systems   Review of Systems  Unable to perform ROS: Mental status change    Physical Exam Updated Vital Signs BP 136/70 (BP Location: Right Arm)    Pulse 92    Temp 99.3 F (37.4 C) (Oral)    Resp (!) 23    Ht '6\' 1"'  (1.854 m)    Wt 115.8 kg    SpO2 100%    BMI 33.68 kg/m   Physical Exam Vitals and nursing note reviewed.  Constitutional:      Appearance: He is well-developed. He is not diaphoretic.     Comments: Alert and fidgety in the bed. Appears  agitated. Intermittently whining and wincing, complaining of pain.  HENT:     Head: Normocephalic and atraumatic.  Eyes:     General: No scleral icterus.    Conjunctiva/sclera: Conjunctivae normal.  Cardiovascular:     Rate and Rhythm: Regular rhythm. Tachycardia present.     Pulses: Normal pulses.     Comments: Borderline tachycardia Pulmonary:     Effort: Pulmonary effort is normal. No respiratory distress.     Breath sounds: No wheezing, rhonchi or rales.     Comments: Respirations even and unlabored. Lungs CTAB. Abdominal:     Palpations: Abdomen is soft.     Comments: Soft, obese, nondistended abdomen. Some focal RUQ TTP without guarding.  Musculoskeletal:        General: Normal range of motion.     Cervical back: Normal range of motion.  Skin:    General: Skin is warm and dry.     Coloration: Skin is not pale.     Findings: No erythema or rash.  Neurological:     Mental Status: He is alert.     Comments: Alert, confused. Cannot oriented to place or time. Exam is limited due to cooperation, though no appreciable focal deficits. Moving all extremities spontaneously.   Psychiatric:        Mood and Affect: Mood is anxious.        Speech: Speech is tangential.        Behavior: Behavior is agitated.    ED Results / Procedures / Treatments   Labs (all labs ordered are listed, but only abnormal results are displayed) Labs Reviewed  CBC - Abnormal; Notable for the following components:      Result Value   MCHC 36.3 (*)    Platelets 145 (*)    All other components within normal limits  COMPREHENSIVE METABOLIC PANEL - Abnormal; Notable for the following components:   Potassium 2.8 (*)    Glucose, Bld 228 (*)    BUN 21 (*)    Creatinine, Ser 3.38 (*)    Calcium 8.5 (*)    Albumin 3.1 (*)    Alkaline Phosphatase 136 (*)    GFR, Estimated 20 (*)  All other components within normal limits  I-STAT CHEM 8, ED - Abnormal; Notable for the following components:   Potassium  2.8 (*)    BUN 23 (*)    Creatinine, Ser 3.40 (*)    Glucose, Bld 230 (*)    Hemoglobin 6.1 (*)    HCT 18.0 (*)    All other components within normal limits  CBG MONITORING, ED - Abnormal; Notable for the following components:   Glucose-Capillary 413 (*)    All other components within normal limits  CBG MONITORING, ED - Abnormal; Notable for the following components:   Glucose-Capillary 158 (*)    All other components within normal limits  TROPONIN I (HIGH SENSITIVITY) - Abnormal; Notable for the following components:   Troponin I (High Sensitivity) 29 (*)    All other components within normal limits  TROPONIN I (HIGH SENSITIVITY) - Abnormal; Notable for the following components:   Troponin I (High Sensitivity) 38 (*)    All other components within normal limits  PROTIME-INR  APTT  DIFFERENTIAL  MAGNESIUM  I-STAT VENOUS BLOOD GAS, ED    EKG EKG Interpretation  Date/Time:  Monday November 29 2021 00:38:55 EST Ventricular Rate:  103 PR Interval:  182 QRS Duration: 86 QT Interval:  357 QTC Calculation: 468 R Axis:   29 Text Interpretation: Sinus or ectopic atrial tachycardia Anteroseptal infarct, old Borderline repolarization abnormality Nonspecific ST abnormality Confirmed by Ezequiel Essex [phone]) on 11/29/2021 2:55:18 AM  Radiology CT HEAD CODE STROKE WO CONTRAST  Result Date: 11/29/2021 CLINICAL DATA:  Code stroke.  Acute neurologic deficit EXAM: CT HEAD WITHOUT CONTRAST TECHNIQUE: Contiguous axial images were obtained from the base of the skull through the vertex without intravenous contrast. COMPARISON:  None. FINDINGS: Brain: There is no mass, hemorrhage or extra-axial collection. There is generalized atrophy without lobar predilection. There is hypoattenuation of the periventricular white matter, most commonly indicating chronic ischemic microangiopathy. Unchanged appearance of old right MCA territory infarct. Vascular: No abnormal hyperdensity of the major  intracranial arteries or dural venous sinuses. No intracranial atherosclerosis. Skull: The visualized skull base, calvarium and extracranial soft tissues are normal. Sinuses/Orbits: No fluid levels or advanced mucosal thickening of the visualized paranasal sinuses. No mastoid or middle ear effusion. The orbits are normal. ASPECTS [facility] Stroke Program Early CT Score) - Ganglionic level infarction (caudate, lentiform nuclei, internal capsule, insula, M1-M3 cortex): 7 - Supraganglionic infarction (M4-M6 cortex): 3 Total score (0-10 with 10 being normal): 10 IMPRESSION: 1. No acute intracranial abnormality. 2. ASPECTS is 10. 3. Old right MCA territory infarct and chronic ischemic microangiopathy. These results were communicated to Dr. Donnetta Simpers at 12:37 am on 11/29/2021 by text page via the [facility] messaging system. Electronically Signed   By: Ulyses Jarred M.D.   On: 11/29/2021 00:41    Procedures Procedures   Medications Ordered in ED Medications  potassium chloride 10 mEq in 100 mL IVPB (0 mEq Intravenous Stopped 11/29/21 0545)  sodium chloride flush (NS) 0.9 % injection 3 mL (3 mLs Intravenous Given 11/29/21 0128)  hydrALAZINE (APRESOLINE) tablet 100 mg (100 mg Oral Given 11/29/21 0306)  amLODipine (NORVASC) tablet 10 mg (10 mg Oral Given 11/29/21 0307)  labetalol (NORMODYNE) injection 20 mg (20 mg Intravenous Given 11/29/21 0446)    ED Course  I have reviewed the triage vital signs and the nursing notes.  Pertinent labs & imaging results that were available during my care of the patient were reviewed by me and considered in my medical decision making (see  chart for details).  Clinical Course as of 11/29/21 0625  Mon Nov 29, 2021  0232 I-stat with hemoglobin of 6.1. Potassium 2.8. Pending formal CBC.  [KH]  0236 Lab CBC returned normal with Hgb of 16. Chem-8 results likely spurious value.  [RV]  6153 Will trend troponin. Elevation is c/w suspected hypertensive urgency/emergency. [KH]   7943 CT not crossing over, but reviewed. Negative for intracranial process. Also excludes SAH given timing of imaging from onset of symptoms. [KH]    Clinical Course User Index [KH] Beverely Pace   MDM Rules/Calculators/A&P                          60 year old male presenting for altered mental status.  Began to become more agitated and confused at 2200 tonight.  Per chart review, patient with history of similar episodes necessitating admission.  Encephalopathy felt to be secondary to hypertensive urgency/emergency and/or episodes of hypoglycemia.  Patient has a longstanding history of medication noncompliance.  Was actually discharged by his primary care doctor 10 days ago citing similar reasons.  Noted to be alert and oriented x1 on my assessment.  Initially presented to the ED as a code stroke.  His CT head did not show any acute intracranial pathology.  No obvious focal deficits on my exam of the patient.  He was significantly hypertensive up to 276 systolic.  Blood pressure has improved with oral daily medications in addition to 1 dose of IV labetalol.  Most recent systolic in the 147W.  Patient was also noted to be hyperglycemic with EMS.  Was given 20 units of fast acting insulin prior to transport.  His CBG has trended down without additional intervention.  Most recently 158.  Plan for admission for observation given encephalopathic picture.  At this time, favored to have recurrent hypertensive crisis.  Troponin with slight elevation, likely representing demand.  This has been fairly stable when trended in the ED.  Patient with known history of CKD with mild AKI today.  Creatinine 3.4 up from 2.3 in October.  TRH to admit for ongoing management. Case discussed with Dr. Marlowe Sax.  From code stroke/neurology perspective, recommendations are for MRI brain without contrast should patient not return to baseline once hypertension and hyperglycemia are addressed.    Final Clinical  Impression(s) / ED Diagnoses Final diagnoses:  Encephalopathy  Hypertensive crisis without congestive heart failure  AKI (acute kidney injury) ([place])  NSTEMI (non-ST elevated myocardial infarction) [facility])    Rx / DC Orders ED Discharge Orders     None        Antonietta Breach, PA-C 11/29/21 9295    Ezequiel Essex, MD 11/29/21 [phone]

## 2021-11-29 NOTE — Assessment & Plan Note (Signed)
-  Continue Lipitor, Zetia ?

## 2021-11-29 NOTE — ED Triage Notes (Signed)
Pt BIB [facility] from home, LKN 91 tonight, family noted pt altered from his baseline, hypertensive and hyperglycemic in the 500s. Given 20units fast acting insulin with some improvement in mental status on EMS arrival. Pt agitated and confused on arrival to ED.

## 2021-11-29 NOTE — Assessment & Plan Note (Addendum)
-  Patient with h/o CVA and seizures presenting with AMS -Patient presenting with encephalopathy as evidenced by his inability to answer question appropriately -He has had an acute headache, but this appears to be improved at this time -He reported medication compliance to his girlfriend, but he does have a h/o noncompliance -Evaluation thus far unremarkable other than mild worsening of CKD and mild but flat troponin elevation (demand vs. Associated with abnormal renal function) -He did have marked HTN while in the ER but this imrpoved -Will check UA and UDS -Will observe overnight on telemetry (instead of progressive given improvement in BP) -Will check MRI and EEG -Neurology has consulted, will call back if studies are abnormal -He also has baseline mental illness, which could be contributing; continue Seroquel and Zoloft for now

## 2021-11-29 NOTE — Assessment & Plan Note (Signed)
-  Currently worse than usual baseline -This could be progression of CKD due to medication nonCPL -Component of AKI is also possible -Will give gentle IVF infusion and follow

## 2021-11-29 NOTE — ED Notes (Signed)
EEG at bedside.

## 2021-11-29 NOTE — Evaluation (Signed)
Physical Therapy Evaluation Patient Details Name: Brandon [name] MRN: [id number] DOB: Oct 12, 1961 Today's Date: 11/29/2021  History of Present Illness  Pt is a 60 y/o male admitted secondary to AMS. Found to be hypertensive and hyperglycemic. PMH includes DM, HTN, CVA with L deficits, schizophrenia, seizure, and CKD.  Clinical Impression  Pt admitted secondary to problem above with deficits below. Pt requiring min A to stand and take side steps at EOB. Pt requiring multimodal cues for sequencing. Pt with increased pain and unable to tolerate further mobility. Feel pt would benefit from SNF level therapies to address current deficits. Will continue to follow acutely.      Recommendations for follow up therapy are one component of a multi-disciplinary discharge planning process, led by the attending physician.  Recommendations may be updated based on patient status, additional functional criteria and insurance authorization.  Follow Up Recommendations Skilled nursing-short term rehab (<3 hours/day)    Assistance Recommended at Discharge Frequent or constant Supervision/Assistance  Functional Status Assessment Patient has had a recent decline in their functional status and demonstrates the ability to make significant improvements in function in a reasonable and predictable amount of time.  Equipment Recommendations  None recommended by PT    Recommendations for Other Services       Precautions / Restrictions Precautions Precautions: Fall Restrictions Weight Bearing Restrictions: No      Mobility  Bed Mobility Overal bed mobility: Needs Assistance Bed Mobility: Supine to Sit;Sit to Supine     Supine to sit: Min assist Sit to supine: Min assist   General bed mobility comments: Required assist for LE and trunk. Incresaed time required    Transfers Overall transfer level: Needs assistance Equipment used: Rolling walker (2 wheels) Transfers: Sit to/from Stand Sit to Stand: Min  assist           General transfer comment: Min A For lift assist and steadying. Increased effort required secondary to pain.    Ambulation/Gait Ambulation/Gait assistance: Min assist   Assistive device: Rolling walker (2 wheels)         General Gait Details: Min A to take side steps EOB. REquired multimodal cues for sequencing. Pt reporting incresaed pain in LLE and had difficulty taking steps  Stairs            Wheelchair Mobility    Modified Rankin (Stroke Patients Only)       Balance Overall balance assessment: Needs assistance Sitting-balance support: No upper extremity supported;Feet supported Sitting balance-Leahy Scale: Fair     Standing balance support: Bilateral upper extremity supported Standing balance-Leahy Scale: Poor Standing balance comment: Reliant on BUE support                             Pertinent Vitals/Pain Pain Assessment: Faces Faces Pain Scale: Hurts whole lot Pain Location: LLE Pain Descriptors / Indicators: Guarding;Grimacing;Moaning Pain Intervention(s): Limited activity within patient's tolerance;Monitored during session;Repositioned    Home Living Family/patient expects to be discharged to:: Private residence Living Arrangements: Non-relatives/Friends Available Help at Discharge: Friend(s);Available PRN/intermittently Type of Home: Apartment Home Access: Stairs to enter Entrance Stairs-Rails: Right;Left Entrance Stairs-Number of Steps: 12 Alternate Level Stairs-Number of Steps: 10 Home Layout: Two level Home Equipment: Conservation officer, nature (2 wheels);Cane - single point      Prior Function Prior Level of Function : Independent/Modified Independent             Mobility Comments: Uses cane mostly and sometimes RW  Hand Dominance        Extremity/Trunk Assessment   Upper Extremity Assessment Upper Extremity Assessment: Defer to OT evaluation    Lower Extremity Assessment Lower Extremity  Assessment: LLE deficits/detail LLE Deficits / Details: LLE weakness at baseline. REports incresaed pain as well    Cervical / Trunk Assessment Cervical / Trunk Assessment: Normal  Communication   Communication: No difficulties  Cognition Arousal/Alertness: Awake/alert Behavior During Therapy: WFL for tasks assessed/performed Overall Cognitive Status: History of cognitive impairments - at baseline                                          General Comments General comments (skin integrity, edema, etc.): Pt's friend present and interested in pt going to SNF at d/c    Exercises     Assessment/Plan    PT Assessment Patient needs continued PT services  PT Problem List Decreased strength;Decreased range of motion;Decreased activity tolerance;Decreased balance;Decreased mobility;Decreased cognition;Decreased knowledge of use of DME;Decreased safety awareness;Decreased knowledge of precautions       PT Treatment Interventions DME instruction;Stair training;Gait training;Functional mobility training;Therapeutic activities;Therapeutic exercise;Cognitive remediation;Patient/family education    PT Goals (Current goals can be found in the Care Plan section)  Acute Rehab PT Goals Patient Stated Goal: to get better PT Goal Formulation: With patient Time For Goal Achievement: 12/13/21 Potential to Achieve Goals: Good    Frequency Min 2X/week   Barriers to discharge        Co-evaluation               AM-PAC PT "6 Clicks" Mobility  Outcome Measure Help needed turning from your back to your side while in a flat bed without using bedrails?: A Little Help needed moving from lying on your back to sitting on the side of a flat bed without using bedrails?: A Little Help needed moving to and from a bed to a chair (including a wheelchair)?: A Little Help needed standing up from a chair using your arms (e.g., wheelchair or bedside chair)?: A Little Help needed to walk in  hospital room?: A Lot Help needed climbing 3-5 steps with a railing? : Total 6 Click Score: 15    End of Session Equipment Utilized During Treatment: Gait belt Activity Tolerance: Patient limited by pain Patient left: in bed;with call bell/phone within reach;with family/visitor present Nurse Communication: Mobility status PT Visit Diagnosis: Unsteadiness on feet (R26.81);Muscle weakness (generalized) (M62.81);Difficulty in walking, not elsewhere classified (R26.2)    Time: 1031-5945 PT Time Calculation (min) (ACUTE ONLY): 28 min   Charges:   PT Evaluation $PT Eval Moderate Complexity: 1 Mod PT Treatments $Therapeutic Activity: 8-22 mins        Lou Miner, DPT  Acute Rehabilitation Services  Pager: [phone] Office: [phone]   Rudean Hitt 11/29/2021, 2:52 PM

## 2021-11-29 NOTE — Progress Notes (Signed)
Not avail for EEG at this time. Pt will be going to MRI first. Will attempt later when schedule permits

## 2021-11-29 NOTE — Assessment & Plan Note (Addendum)
-  Patient with prior h/o CVA -Altered with HA on admission -CVA is still a consideration so MRI ordered -Neurology consult -PT/OT/ST/Nutrition Consults -Continue ASA

## 2021-11-29 NOTE — Consult Note (Signed)
NEUROLOGY CONSULTATION NOTE   Date of service: November 29, 2021 Patient Name: Brandon [name] MRN:  [id number] DOB:  07-31-1961 Reason for consult: "Stroke code" Requesting Provider: Ezequiel Essex, MD _ _ _   _ __   _ __ _ _  __ __   _ __   __ _  History of Present Illness  Brandon [name] is a 60 y.o. male with PMH significant for history of stroke in 2013 with residual mild left-sided weakness, bilateral renal cancer, depression, diabetes with prior history of DKA, hyperlipidemia, hypertension, focal seizures on Keppra who presents with headache, agitation and encephalopathy in the setting of severe hyperglycemia and malignant hypertension.  He has been seen by our service in the past for similar presentation in the setting of hyperglycemia and malignant hypertension.  EMS activated a code stroke for concern for aphasia and confusion. Was noted to be hyperglycemic with glucose over 400 and SBP over 200s.  mRS: unclear tNKASE: not offered, low suspicion for stroke. Thrombectomy: Not offered, low suspicion for stroke. NIHSS components Score: Comment  1a Level of Conscious 0[x]  1[]  2[]  3[]      1b LOC Questions 0[]  1[]  2[x]       1c LOC Commands 0[x]  1[]  2[]       2 Best Gaze 0[x]  1[]  2[]       3 Visual 0[x]  1[]  2[]  3[]      4 Facial Palsy 0[x]  1[]  2[]  3[]      5a Motor Arm - left 0[x]  1[]  2[]  3[]  4[]  UN[]    5b Motor Arm - Right 0[x]  1[]  2[]  3[]  4[]  UN[]    6a Motor Leg - Left 0[]  1[]  2[x]  3[]  4[]  UN[]    6b Motor Leg - Right 0[x]  1[]  2[]  3[]  4[]  UN[]    7 Limb Ataxia 0[x]  1[]  2[]  3[]  UN[]     8 Sensory 0[x]  1[]  2[]  UN[]      9 Best Language 0[x]  1[]  2[]  3[]      10 Dysarthria 0[x]  1[]  2[]  UN[]      11 Extinct. and Inattention 0[x]  1[]  2[]       TOTAL: 4      ROS   Encephalopathy precludes detailed review of system.  Past History   Past Medical History:  Diagnosis Date   Acute ischemic stroke ([place]) 11/2013   Allergy    Anxiety    Arthritis    Benign hypertension with CKD  (chronic kidney disease) stage III (HCC)    Bil Renal Ca dx'd 09/2011 & 11/2011   left and right; cryoablation bil   CVA (cerebral vascular accident) ([place])    stroke   Depression    BH Adm in [place] Depression   Diabetes mellitus    DKA prior hospitalization   Diverticulitis    s/p micorperforation Sept 2012-managed conservatively by Gen surgery   ED (erectile dysfunction)    Focal seizure ([place]) 11/2013   due to ischemic stroke   GERD (gastroesophageal reflux disease)    Hiatal hernia    Hyperlipidemia    Hypertension    Kidney tumor 09/2011   Renal cell CA   Seizures ([place])    none since 2016, taking Keppra - maw   Sleep apnea    Thyroid disease    "weak thyroid" per MD   Wears glasses    Past Surgical History:  Procedure Laterality Date   COLONOSCOPY     IR RADIOLOGIST EVAL & MGMT  04/04/2017   KIDNEY SURGERY     ablation of renal cell CA - 12/28,  prior one was October [place]   TEE WITHOUT CARDIOVERSION N/A 11/19/2013   Procedure: TRANSESOPHAGEAL ECHOCARDIOGRAM (TEE);  Surgeon: Lelon Perla, MD;  Location[facility] ENDOSCOPY;  Service: Cardiovascular;  Laterality: N/A;   Family History  Problem Relation Age of Onset   Diabetes Father    Heart disease Father    Pneumonia Mother    Prostate cancer Other    Stomach cancer Other    Breast cancer Sister    Colon cancer Neg Hx    Esophageal cancer Neg Hx    Rectal cancer Neg Hx    Social History   Socioeconomic History   Marital status: Single    Spouse name: Not on file   Number of children: 2   Years of education: college   Highest education level: Not on file  Occupational History    Employer: UNEMPLOYED  Tobacco Use   Smoking status: Never   Smokeless tobacco: Never  Vaping Use   Vaping Use: Never used  Substance and Sexual Activity   Alcohol use: Not Currently    Comment: rarely   Drug use: No   Sexual activity: Never  Other Topics Concern   Not on file  Social History Narrative    Patient lives alone.   Education. Two years of college.   Not working.   Right handed.   Caffeine one soda daily. Drinks coffee    Social Determinants of Health   Financial Resource Strain: Low Risk    Difficulty of Paying Living Expenses: Not very hard  Food Insecurity: Not on file  Transportation Needs: Not on file  Physical Activity: Not on file  Stress: Not on file  Social Connections: Not on file   Allergies  Allergen Reactions   Morphine Itching    Medications  (Not in a hospital admission)    Vitals   Vitals:   11/29/21 0145 11/29/21 0200 11/29/21 0215 11/29/21 0230  BP:   (!) 198/102 (!) 183/98  Pulse: 94     Resp: (!) 28 17 10 16   SpO2: 100%     Weight:      Height:         Body mass index is 33.68 kg/m.  Physical Exam   General: Laying comfortably in bed; reports bifrontal headache. HENT: Normal oropharynx and mucosa. Normal external appearance of ears and nose. Neck: Supple, no pain or tenderness  CV: No JVD. No peripheral edema.  Pulmonary: Symmetric Chest rise. Normal respiratory effort.  Abdomen: Soft to touch, non-tender.  Ext: No cyanosis, edema, or deformity  Skin: No rash. Normal palpation of skin.   Musculoskeletal: Normal digits and nails by inspection. No clubbing.   Neurologic Examination  Mental status/Cognition: Alert, oriented to self only. Poor attention. Speech/language: Fluent, comprehension intact, object naming intact, repetition intact. Cranial nerves:   CN II Pupils equal and reactive to light, no VF deficits   CN III,IV,VI EOM intact, no gaze preference or deviation, no nystagmus   CN V normal sensation in V1, V2, and V3 segments bilaterally   CN VII no asymmetry, no nasolabial fold flattening   CN VIII normal hearing to speech   CN IX & X normal palatal elevation, no uvular deviation   CN XI 5/5 head turn and 5/5 shoulder shrug bilaterally   CN XII midline tongue protrusion   Motor:  Muscle bulk: normal, tone normal,  pronator drift LUE drift.  Does not participate with full neuro exam.  However on hand grip strength, he is  noted to be 5 out of 5 in right upper extremity and 4+ out of 5 in left upper extremity.  He initially would not lift his left lower extremity to command.  However, later was able to hold it off the bed with some drift.  Reflexes:  Right Left Comments  Pectoralis      Biceps (C5/6) 2 2   Brachioradialis (C5/6) 2 2    Triceps (C6/7) 2 2    Patellar (L3/4) 2 2    Achilles (S1)      Hoffman      Plantar     Jaw jerk    Sensation:  Light touch Decreased on the left.   Pin prick    Temperature    Vibration   Proprioception    Coordination/Complex Motor:  - Finger to Nose intact BL - Heel to shin would not do for me. - Rapid alternating movement would not do for me. - Gait: Deferred for patient safety.  Labs   CBC:  Recent Labs  Lab 11/29/21 0228 11/29/21 0229  WBC 5.1  --   NEUTROABS 3.9  --   HGB 16.1 6.1*  HCT 44.4 18.0*  MCV 82.5  --   PLT 145*  --     Basic Metabolic Panel:  Lab Results  Component Value Date   NA 141 11/29/2021   K 2.8 (L) 11/29/2021   CO2 21 10/19/2021   GLUCOSE 230 (H) 11/29/2021   BUN 23 (H) 11/29/2021   CREATININE 3.40 (H) 11/29/2021   CALCIUM 9.1 10/19/2021   GFRNONAA 25 (L) 09/21/2021   GFRAA 44 (L) 09/18/2020   Lipid Panel:  Lab Results  Component Value Date   LDLCALC 107 (H) 01/09/2020   HgbA1c:  Lab Results  Component Value Date   HGBA1C 8.4 (H) 09/14/2021   Urine Drug Screen:     Component Value Date/Time   LABOPIA NONE DETECTED 09/15/2021 0409   COCAINSCRNUR NONE DETECTED 09/15/2021 0409   LABBENZ NONE DETECTED 09/15/2021 0409   AMPHETMU NONE DETECTED 09/15/2021 0409   THCU NONE DETECTED 09/15/2021 0409   LABBARB NONE DETECTED 09/15/2021 0409    Alcohol Level     Component Value Date/Time   ETH <10 09/14/2021 0548    CT Head without contrast(Personally reviewed): CTH was negative for a large  hypodensity concerning for a large territory infarct or hyperdensity concerning for an ICH. Notable for known chronic R MCA stroke.  Impression   MAKOTO SELLITTO is a 60 y.o. male with PMH significant for history of stroke in 2013 with residual mild left-sided weakness, bilateral renal cancer, depression, diabetes with prior history of DKA, hyperlipidemia, hypertension, focal seizures on Keppra who presents with headache, agitation and encephalopathy in the setting of severe hyperglycemia and malignant hypertension. His neurologic examination is notable for encephalopathy, poor attention, some aphasia but not particularly out of proportion to his mentation, agitation.  Recommendations   Would recommend gradual reduction in Blood pressure and treating hyperglycemia. He has had an almost identical presentation in Aug 2022 so would recommend clinically monitoring him and if he does not improve after addressing HTN and hyperglycemia, would recommend getting an MRI Brain without contrast at that time.  Continue home Keppra. ______________________________________________________________________   Thank you for the opportunity to take part in the care of this patient. If you have any further questions, please contact the neurology consultation attending.  Signed,  [place] Pager Number [phone] _ _ _   _ __  _ __ _ _  __ __   _ __   __ _ ° °

## 2021-11-29 NOTE — ED Notes (Signed)
Patient transported to MRI 

## 2021-11-29 NOTE — H&P (Signed)
History and Physical    Patient: Brandon [name] CWC:[id number] DOB: 08/01/1961 DOA: 11/29/2021 DOS: the patient was seen and examined on 11/29/2021 PCP: Carlena Hurl, PA-C  Patient coming from: Home - lives with [place]; NOKCelesta Gentile, [phone]  Chief Complaint: AMS  HPI: Brandon [name] is a 60 y.o. male with medical history significant of CVA with associated seizures (2014); stage 3 CKD; B RCC; DM; GERD; HTN; HLD; OSA; and hypothyroidism presenting with AMS.  His fiancee reported that he was acting a little strange and he reported that he had been taking his usual medications.  He got worse and started complaining of headache.  She tried to give him insulin and he wouldn't let her do it.  Headache started last night.  No vision changes.  He reports that his head is not hurting now.  He was confused at the time of admission and reported living alone and not having a NOK (despite his girlfriend sitting at the bedside).    ER Course: [place], per Dr. Marlowe Sax:  60 year old with history of CKD stage IIIb-IV, CVA, seizures, diabetes presented as code stroke with altered mental status-agitation, confusion, disorientation.  No focal neurodeficit.  Head CT negative.  Blood pressure significantly elevated with systolic in the 761Y, now improved after labetalol.  Troponin mildly elevated and felt to be due to demand ischemia.  Baseline creatinine 2.3 and now up to 3.4.  Blood glucose was in the 500s at home with EMS and wife gave insulin, now improved to 200s.  Neurology recommended admission for encephalopathy work-up and brain MRI if not improving.   Review of Systems: ROS reviewed and negative except as above; limited by AMS  Past Medical History:  Diagnosis Date   Acute ischemic stroke ([place]) 11/2013   Allergy    Anxiety    Arthritis    Benign hypertension with CKD (chronic kidney disease) stage III (HCC)    Bil Renal Ca dx'd 09/2011 & 11/2011   left and right; cryoablation bil    Depression    BH Adm in [place] Depression   Diabetes mellitus    DKA prior hospitalization   Diverticulitis    s/p micorperforation Sept 2012-managed conservatively by Gen surgery   ED (erectile dysfunction)    Focal seizure ([place]) 11/2013   due to ischemic stroke   GERD (gastroesophageal reflux disease)    Hiatal hernia    Hyperlipidemia    Hypertension    Seizures ([place])    none since 2016, taking Keppra - maw   Sleep apnea    Thyroid disease    "weak thyroid" per MD   Wears glasses    Past Surgical History:  Procedure Laterality Date   COLONOSCOPY     IR RADIOLOGIST EVAL & MGMT  04/04/2017   KIDNEY SURGERY     ablation of renal cell CA - 12/28, prior one was October [place]   TEE WITHOUT CARDIOVERSION N/A 11/19/2013   Procedure: TRANSESOPHAGEAL ECHOCARDIOGRAM (TEE);  Surgeon: Lelon Perla, MD;  Location: [facility] ENDOSCOPY;  Service: Cardiovascular;  Laterality: N/A;   Social History:  reports that he has never smoked. He has never used smokeless tobacco. He reports that he does not currently use alcohol. He reports that he does not use drugs.  Allergies  Allergen Reactions   Morphine Itching    Family History  Problem Relation Age of Onset   Diabetes Father    Heart disease Father    Pneumonia Mother    Prostate cancer  Other    Stomach cancer Other    Breast cancer Sister    Colon cancer Neg Hx    Esophageal cancer Neg Hx    Rectal cancer Neg Hx     Prior to Admission medications   Medication Sig Start Date End Date Taking? Authorizing Provider  amLODipine (NORVASC) 10 MG tablet TAKE 1 TABLET BY MOUTH EVERY DAY Patient taking differently: Take 10 mg by mouth daily. 11/10/21  Yes Tysinger, Camelia Eng, PA-C  aspirin EC 81 MG tablet Take 81 mg by mouth daily. Swallow whole.   Yes [provider]  atorvastatin (LIPITOR) 40 MG tablet Take 1 tablet (40 mg total) by mouth daily. 10/19/21 01/17/22 Yes Tysinger, Camelia Eng, PA-C  ezetimibe (ZETIA) 10 MG  tablet Take 1 tablet (10 mg total) by mouth daily. 10/19/21 01/17/22 Yes Tysinger, Camelia Eng, PA-C  hydrALAZINE (APRESOLINE) 100 MG tablet Take 1 tablet (100 mg total) by mouth every 8 (eight) hours. 09/21/21 11/29/21 Yes Pahwani, Einar Grad, MD  insulin aspart (NOVOLOG) 100 UNIT/ML FlexPen Inject 2-10 Units into the skin 3 (three) times daily with meals. If glucose 150-200 take 2 units, glucose 201-250 take 4 units, 251-300 take 6 units, glucose 301-350 take 8 units, glucose 351-400 take 10 units 07/10/21 11/29/21 Yes [place] Eric J, DO  insulin glargine (LANTUS) 100 UNIT/ML Solostar Pen Inject 20 Units into the skin daily. Patient taking differently: Inject 40 Units into the skin daily. 09/21/21 12/20/21 Yes Pahwani, Einar Grad, MD  levETIRAcetam (KEPPRA) 500 MG tablet Take 2 tablets (1,000 mg total) by mouth daily. Patient taking differently: Take 500 mg by mouth 2 (two) times daily. 10/19/21 01/17/22 Yes Tysinger, Camelia Eng, PA-C  omeprazole (PRILOSEC) 40 MG capsule Take 1 capsule (40 mg total) by mouth daily. Patient taking differently: Take 40 mg by mouth daily as needed (heartburn/indigestion). 07/10/21 11/29/21 Yes [place] Eric J, DO  QUEtiapine (SEROQUEL) 25 MG tablet Take 1 tablet (25 mg total) by mouth at bedtime. 10/19/21 01/17/22 Yes Tysinger, Camelia Eng, PA-C  sertraline (ZOLOFT) 25 MG tablet Take 1 tablet (25 mg total) by mouth daily. 10/19/21 01/17/22 Yes Tysinger, Camelia Eng, PA-C  blood glucose meter kit and supplies Dispense based on patient and insurance preference. Use up to four times daily as directed. (FOR ICD-9 250.00, 250.01). Patient taking differently: 1 each by Other route See admin instructions. Dispense based on patient and insurance preference. Use up to four times daily as directed. (FOR ICD-9 250.00, 250.01). 04/24/16   Eugenie Filler, MD  glucose blood (ACCU-CHEK ACTIVE STRIPS) test strip Use as instructed Patient taking differently: 1 each by Other route as directed. 06/11/19   Tysinger, Camelia Eng, PA-C  glucose blood test strip accu chek active 1-2 times daily Patient taking differently: 1 each by Other route See admin instructions. accu chek active 1-2 times daily 01/17/20   Tysinger, Camelia Eng, PA-C  Insulin Pen Needle (NOVOFINE PLUS PEN NEEDLE) 32G X 4 MM MISC Use as directed with insulin pen 07/10/21   [place] Donnamarie Poag, DO  Lancets (ACCU-CHEK SOFT TOUCH) lancets Use as instructed Patient taking differently: 1 each by Other route as directed. 06/11/19   Tysinger, Camelia Eng, PA-C  UNABLE TO FIND Med Name:  Custom diabetic shoes  Dx: E11.42 Patient not taking: Reported on 11/29/2021 05/14/20   Shamleffer, Melanie Crazier, MD    Physical Exam: Vitals:   11/29/21 0900 11/29/21 0930 11/29/21 1000 11/29/21 1030  BP: (!) 142/81 (!) 145/73 (!) 144/74 (!) 150/76  Pulse: 81  82 77 79  Resp: (!) '24 20 19 17  ' Temp:      TempSrc:      SpO2: 99% 98% 99% 99%  Weight:      Height:       General:  Appears calm and comfortable and is in NAD Eyes:  PERRL, EOMI, normal lids, iris ENT:  grossly normal hearing, lips & tongue, mmm Neck:  no LAD, masses or thyromegaly Cardiovascular:  RRR, no m/r/g. No LE edema.  Respiratory:   CTA bilaterally with no wheezes/rales/rhonchi.  Normal to mildly increased respiratory effort. Abdomen:  soft, NT, ND Back:   normal alignment, no CVAT Skin:  no rash or induration seen on limited exam Musculoskeletal:  grossly normal tone BUE/BLE, good ROM, no bony abnormality Psychiatric:  grossly normal mood and affect but giving inaccurate information, speech sparse but appropriate Neurologic:  CN 2-12 grossly intact, moves all extremities in coordinated fashion   Radiological Exams on Admission: Independently reviewed - see discussion in A/P where applicable  CT HEAD CODE STROKE WO CONTRAST  Result Date: 11/29/2021 CLINICAL DATA:  Code stroke.  Acute neurologic deficit EXAM: CT HEAD WITHOUT CONTRAST TECHNIQUE: Contiguous axial images were obtained from the base of  the skull through the vertex without intravenous contrast. COMPARISON:  None. FINDINGS: Brain: There is no mass, hemorrhage or extra-axial collection. There is generalized atrophy without lobar predilection. There is hypoattenuation of the periventricular white matter, most commonly indicating chronic ischemic microangiopathy. Unchanged appearance of old right MCA territory infarct. Vascular: No abnormal hyperdensity of the major intracranial arteries or dural venous sinuses. No intracranial atherosclerosis. Skull: The visualized skull base, calvarium and extracranial soft tissues are normal. Sinuses/Orbits: No fluid levels or advanced mucosal thickening of the visualized paranasal sinuses. No mastoid or middle ear effusion. The orbits are normal. ASPECTS [facility] Stroke Program Early CT Score) - Ganglionic level infarction (caudate, lentiform nuclei, internal capsule, insula, M1-M3 cortex): 7 - Supraganglionic infarction (M4-M6 cortex): 3 Total score (0-10 with 10 being normal): 10 IMPRESSION: 1. No acute intracranial abnormality. 2. ASPECTS is 10. 3. Old right MCA territory infarct and chronic ischemic microangiopathy. These results were communicated to Dr. Donnetta Simpers at 12:37 am on 11/29/2021 by text page via the [facility] messaging system. Electronically Signed   By: Ulyses Jarred M.D.   On: 11/29/2021 00:41    EKG: Independently reviewed.  Sinus tachycardia with rate 103; nonspecific ST changes with no evidence of acute ischemia   Labs on Admission: I have personally reviewed the available labs and imaging studies at the time of the admission.  Pertinent labs:    K+ 2.8 Glucose 228 BUN 21/Creatinine 3.38/GFR 20 - baseline creatinine 2.5, GFR 30 HS troponin 29, 38 Unremarkable CBC   Assessment/Plan * AMS (altered mental status)- (present on admission) -Patient with h/o CVA and seizures presenting with AMS -Patient presenting with encephalopathy as evidenced by his inability to answer  question appropriately -He has had an acute headache, but this appears to be improved at this time -He reported medication compliance to his girlfriend, but he does have a h/o noncompliance -Evaluation thus far unremarkable other than mild worsening of CKD and mild but flat troponin elevation (demand vs. Associated with abnormal renal function) -He did have marked HTN while in the ER but this imrpoved -Will check UA and UDS -Will observe overnight on telemetry (instead of progressive given improvement in BP) -Will check MRI and EEG -Neurology has consulted, will call back if studies are abnormal -He also  has baseline mental illness, which could be contributing; continue Seroquel and Zoloft for now  Type 2 diabetes mellitus with diabetic polyneuropathy, with long-term current use of insulin (HCC) -Recent A1c was 8.4, indicating poor control -Continue glargine -Cover with moderate-scale SSI  Stage 3b chronic kidney disease ([place])- (present on admission) -Currently worse than usual baseline -This could be progression of CKD due to medication nonCPL -Component of AKI is also possible -Will give gentle IVF infusion and follow  Seizure disorder ([place]) -Continue Keppra -EEG ordered -Seizure precautions -Will order prn Ativan  History of cerebrovascular accident (CVA) with residual deficit -Patient with prior h/o CVA -Altered with HA on admission -CVA is still a consideration so MRI ordered -Neurology consult -PT/OT/ST/Nutrition Consults -Continue ASA  Hypertension associated with diabetes ([place])- (present on admission) -Markedly uncontrolled HTN while in the ER -He could have HTN crisis and/or PRES -BP has improved -Neurology recommends slowly lowering BP -Norvasc held -Will order prn IV hydralazine for SBP >180  Dyslipidemia associated with type 2 diabetes mellitus ([place])- (present on admission) -Continue Lipitor, Zetia    Advance Care Planning:   Code Status: Full Code    Consults: Neurology; OT/PT/ST/Nutrition/TOC team  Family Communication: Celesta Gentile was present throughout evaluation  Severity of Illness: The appropriate patient status for this patient is OBSERVATION. Observation status is judged to be reasonable and necessary in order to provide the required intensity of service to ensure the patient's safety. The patient's presenting symptoms, physical exam findings, and initial radiographic and laboratory data in the context of their medical condition is felt to place them at decreased risk for further clinical deterioration. Furthermore, it is anticipated that the patient will be medically stable for discharge from the hospital within 2 midnights of admission.   Author: Karmen Bongo 11/29/2021 12:18 PM  For on call review [email or web address].

## 2021-11-29 NOTE — Code Documentation (Signed)
Responded to Code Stroke called at [place] for exp aphasia, [id number]. Pt arrived at 0010, CBG-413, NIH-4, CT head negative. Plan metabolic workup, BP/CBG control.

## 2021-11-29 NOTE — Procedures (Signed)
Patient Name: Brandon [name]  MRN: [id number]  Epilepsy Attending: Lora Havens  Referring Physician/Provider: Dr Karmen Bongo Date: 11/29/2021 Duration: 22.59 mins  Patient history: 60 y.o. male with PMH significant for history of stroke in 2013 with residual mild left-sided weakness, bilateral renal cancer, depression, diabetes with prior history of DKA, hyperlipidemia, hypertension, focal seizures on Keppra who presents with headache, agitation and encephalopathy in the setting of severe hyperglycemia and malignant hypertension. EEG to evaluate for seizure  Level of alertness: Awake, asleep  AEDs during EEG study: LEV  Technical aspects: This EEG study was done with scalp electrodes positioned according to the 10-20 International system of electrode placement. Electrical activity was acquired at a sampling rate of 500Hz  and reviewed with a high frequency filter of 70Hz  and a low frequency filter of 1Hz . EEG data were recorded continuously and digitally stored.   Description: The posterior dominant rhythm consists of 7.5 Hz activity of moderate voltage (25-35 uV) seen predominantly in posterior head regions, symmetric and reactive to eye opening and eye closing. Sleep was characterized by vertex waves, sleep spindles (12 to 14 Hz), maximal frontocentral region. EEG also showed 5-7hz  theta slowing in right hemisphere. Hyperventilation and photic stimulation were not performed.     ABNORMALITY - Continuous slow, right hemisphere  IMPRESSION: This study is suggestive of cortical dysfunction in right hemisphere, likely due to underlying encephalomalacia. No seizures or definite epileptiform discharges were seen throughout the recording.  [name] Barbra Sarks

## 2021-11-29 NOTE — Assessment & Plan Note (Addendum)
-  Continue Keppra -EEG ordered -Seizure precautions -Will order prn Ativan

## 2021-11-30 ENCOUNTER — Inpatient Hospital Stay (HOSPITAL_COMMUNITY): Payer: Medicare Other

## 2021-11-30 DIAGNOSIS — E1165 Type 2 diabetes mellitus with hyperglycemia: Secondary | ICD-10-CM | POA: Diagnosis present

## 2021-11-30 DIAGNOSIS — E8770 Fluid overload, unspecified: Secondary | ICD-10-CM | POA: Diagnosis present

## 2021-11-30 DIAGNOSIS — F32A Depression, unspecified: Secondary | ICD-10-CM | POA: Diagnosis present

## 2021-11-30 DIAGNOSIS — E1159 Type 2 diabetes mellitus with other circulatory complications: Secondary | ICD-10-CM | POA: Diagnosis not present

## 2021-11-30 DIAGNOSIS — E8779 Other fluid overload: Secondary | ICD-10-CM | POA: Diagnosis not present

## 2021-11-30 DIAGNOSIS — E1122 Type 2 diabetes mellitus with diabetic chronic kidney disease: Secondary | ICD-10-CM | POA: Diagnosis present

## 2021-11-30 DIAGNOSIS — E1129 Type 2 diabetes mellitus with other diabetic kidney complication: Secondary | ICD-10-CM | POA: Diagnosis not present

## 2021-11-30 DIAGNOSIS — E669 Obesity, unspecified: Secondary | ICD-10-CM | POA: Diagnosis present

## 2021-11-30 DIAGNOSIS — I152 Hypertension secondary to endocrine disorders: Secondary | ICD-10-CM | POA: Diagnosis not present

## 2021-11-30 DIAGNOSIS — N179 Acute kidney failure, unspecified: Secondary | ICD-10-CM | POA: Diagnosis present

## 2021-11-30 DIAGNOSIS — F419 Anxiety disorder, unspecified: Secondary | ICD-10-CM | POA: Diagnosis present

## 2021-11-30 DIAGNOSIS — E1169 Type 2 diabetes mellitus with other specified complication: Secondary | ICD-10-CM | POA: Diagnosis present

## 2021-11-30 DIAGNOSIS — R4182 Altered mental status, unspecified: Secondary | ICD-10-CM | POA: Diagnosis not present

## 2021-11-30 DIAGNOSIS — G8194 Hemiplegia, unspecified affecting left nondominant side: Secondary | ICD-10-CM | POA: Diagnosis present

## 2021-11-30 DIAGNOSIS — I161 Hypertensive emergency: Secondary | ICD-10-CM | POA: Diagnosis present

## 2021-11-30 DIAGNOSIS — R29704 NIHSS score 4: Secondary | ICD-10-CM | POA: Diagnosis present

## 2021-11-30 DIAGNOSIS — Z20822 Contact with and (suspected) exposure to covid-19: Secondary | ICD-10-CM | POA: Diagnosis present

## 2021-11-30 DIAGNOSIS — E1142 Type 2 diabetes mellitus with diabetic polyneuropathy: Secondary | ICD-10-CM | POA: Diagnosis present

## 2021-11-30 DIAGNOSIS — E11649 Type 2 diabetes mellitus with hypoglycemia without coma: Secondary | ICD-10-CM | POA: Diagnosis not present

## 2021-11-30 DIAGNOSIS — K219 Gastro-esophageal reflux disease without esophagitis: Secondary | ICD-10-CM | POA: Diagnosis present

## 2021-11-30 DIAGNOSIS — R4701 Aphasia: Secondary | ICD-10-CM | POA: Diagnosis present

## 2021-11-30 DIAGNOSIS — I129 Hypertensive chronic kidney disease with stage 1 through stage 4 chronic kidney disease, or unspecified chronic kidney disease: Secondary | ICD-10-CM | POA: Diagnosis present

## 2021-11-30 DIAGNOSIS — I6930 Unspecified sequelae of cerebral infarction: Secondary | ICD-10-CM | POA: Diagnosis not present

## 2021-11-30 DIAGNOSIS — G9340 Encephalopathy, unspecified: Secondary | ICD-10-CM | POA: Diagnosis present

## 2021-11-30 DIAGNOSIS — E785 Hyperlipidemia, unspecified: Secondary | ICD-10-CM | POA: Diagnosis present

## 2021-11-30 DIAGNOSIS — N2889 Other specified disorders of kidney and ureter: Secondary | ICD-10-CM | POA: Diagnosis present

## 2021-11-30 DIAGNOSIS — D649 Anemia, unspecified: Secondary | ICD-10-CM | POA: Diagnosis present

## 2021-11-30 DIAGNOSIS — D631 Anemia in chronic kidney disease: Secondary | ICD-10-CM | POA: Diagnosis not present

## 2021-11-30 DIAGNOSIS — G4730 Sleep apnea, unspecified: Secondary | ICD-10-CM | POA: Diagnosis present

## 2021-11-30 DIAGNOSIS — R569 Unspecified convulsions: Secondary | ICD-10-CM | POA: Diagnosis present

## 2021-11-30 DIAGNOSIS — N184 Chronic kidney disease, stage 4 (severe): Secondary | ICD-10-CM | POA: Diagnosis present

## 2021-11-30 DIAGNOSIS — G9341 Metabolic encephalopathy: Secondary | ICD-10-CM | POA: Diagnosis present

## 2021-11-30 LAB — CBC WITH DIFFERENTIAL/PLATELET
Abs Immature Granulocytes: 0.03 10*3/uL (ref 0.00–0.07)
Basophils Absolute: 0 10*3/uL (ref 0.0–0.1)
Basophils Relative: 0 %
Eosinophils Absolute: 0.2 10*3/uL (ref 0.0–0.5)
Eosinophils Relative: 2 %
HCT: 33.5 % — ABNORMAL LOW (ref 39.0–52.0)
Hemoglobin: 11.8 g/dL — ABNORMAL LOW (ref 13.0–17.0)
Immature Granulocytes: 0 %
Lymphocytes Relative: 32 %
Lymphs Abs: 2.3 10*3/uL (ref 0.7–4.0)
MCH: 29.7 pg (ref 26.0–34.0)
MCHC: 35.2 g/dL (ref 30.0–36.0)
MCV: 84.4 fL (ref 80.0–100.0)
Monocytes Absolute: 0.4 10*3/uL (ref 0.1–1.0)
Monocytes Relative: 6 %
Neutro Abs: 4.2 10*3/uL (ref 1.7–7.7)
Neutrophils Relative %: 60 %
Platelets: 242 10*3/uL (ref 150–400)
RBC: 3.97 MIL/uL — ABNORMAL LOW (ref 4.22–5.81)
RDW: 11.9 % (ref 11.5–15.5)
WBC: 7.1 10*3/uL (ref 4.0–10.5)
nRBC: 0 % (ref 0.0–0.2)

## 2021-11-30 LAB — COMPREHENSIVE METABOLIC PANEL
ALT: 15 U/L (ref 0–44)
AST: 18 U/L (ref 15–41)
Albumin: 2.9 g/dL — ABNORMAL LOW (ref 3.5–5.0)
Alkaline Phosphatase: 122 U/L (ref 38–126)
Anion gap: 7 (ref 5–15)
BUN: 21 mg/dL — ABNORMAL HIGH (ref 6–20)
CO2: 25 mmol/L (ref 22–32)
Calcium: 8.6 mg/dL — ABNORMAL LOW (ref 8.9–10.3)
Chloride: 108 mmol/L (ref 98–111)
Creatinine, Ser: 3.98 mg/dL — ABNORMAL HIGH (ref 0.61–1.24)
GFR, Estimated: 17 mL/min — ABNORMAL LOW (ref >60)
Glucose, Bld: 88 mg/dL (ref 70–99)
Potassium: 3 mmol/L — ABNORMAL LOW (ref 3.5–5.1)
Sodium: 140 mmol/L (ref 135–145)
Total Bilirubin: 1 mg/dL (ref 0.3–1.2)
Total Protein: 6.5 g/dL (ref 6.5–8.1)

## 2021-11-30 LAB — GLUCOSE, CAPILLARY
Glucose-Capillary: 70 mg/dL (ref 70–99)
Glucose-Capillary: 192 mg/dL — ABNORMAL HIGH (ref 70–99)
Glucose-Capillary: 217 mg/dL — ABNORMAL HIGH (ref 70–99)
Glucose-Capillary: 208 mg/dL — ABNORMAL HIGH (ref 70–99)

## 2021-11-30 LAB — MAGNESIUM: Magnesium: 1.8 mg/dL (ref 1.7–2.4)

## 2021-11-30 MED ORDER — POTASSIUM CHLORIDE CRYS ER 20 MEQ PO TBCR
40.0000 meq | EXTENDED_RELEASE_TABLET | ORAL | Status: AC
  Administered 2021-11-30 (×2): 40 meq via ORAL
  Filled 2021-11-30 (×2): qty 2

## 2021-11-30 MED ORDER — ADULT MULTIVITAMIN W/MINERALS CH
1.0000 | ORAL_TABLET | Freq: Every day | ORAL | Status: DC
  Administered 2021-11-30 – 2021-12-04 (×5): 1 via ORAL
  Filled 2021-11-30 (×5): qty 1

## 2021-11-30 MED ORDER — ENSURE ENLIVE PO LIQD
237.0000 mL | Freq: Two times a day (BID) | ORAL | Status: DC
  Administered 2021-11-30 – 2021-12-04 (×6): 237 mL via ORAL

## 2021-11-30 MED ORDER — ENOXAPARIN SODIUM 30 MG/0.3ML IJ SOSY
30.0000 mg | PREFILLED_SYRINGE | INTRAMUSCULAR | Status: DC
  Administered 2021-12-01 – 2021-12-04 (×4): 30 mg via SUBCUTANEOUS
  Filled 2021-11-30 (×4): qty 0.3

## 2021-11-30 MED ORDER — AMLODIPINE BESYLATE 10 MG PO TABS
10.0000 mg | ORAL_TABLET | Freq: Every day | ORAL | Status: DC
  Administered 2021-11-30 – 2021-12-04 (×5): 10 mg via ORAL
  Filled 2021-11-30 (×5): qty 1

## 2021-11-30 MED ORDER — HYDRALAZINE HCL 50 MG PO TABS
100.0000 mg | ORAL_TABLET | Freq: Three times a day (TID) | ORAL | Status: DC
  Administered 2021-11-30 – 2021-12-02 (×6): 100 mg via ORAL
  Filled 2021-11-30 (×6): qty 2

## 2021-11-30 MED ORDER — SODIUM CHLORIDE 0.9 % IV SOLN: INTRAVENOUS | Status: DC

## 2021-11-30 NOTE — Plan of Care (Signed)
Neurology Plan of Care  MRI brain reviewed 12/26:  No change. No acute MR finding. Old infarction in the right middle cerebral artery territory. Mild chronic small-vessel ischemic change elsewhere within the hemispheric white matter.  Routine EEG 12/26: "This study is suggestive of cortical dysfunction in right hemisphere, likely due to underlying encephalomalacia. No seizures or definite epileptiform discharges were seen throughout the recording."  Plan: - Continue management of blood pressure and metabolic derangements per primary team  - Neurology will continue to follow as blood pressure and metabolic derangements are normalized - Continue home [place] discussed with attending neurologist who is in agreement.  Anibal Henderson, AGACNP-BC [facility] [phone]

## 2021-11-30 NOTE — Evaluation (Signed)
Speech Language Pathology Evaluation Patient Details Name: Brandon [name] MRN: [id number] DOB: [birth year] Today's Date: 11/30/2021 Time:  -     Problem List:  Patient Active Problem List   Diagnosis Date Noted   AMS (altered mental status) 11/29/2021   Hypokalemia 11/29/2021   Callus 10/19/2021   Enlarged and hypertrophic nails 10/19/2021   Open wound of toe 10/19/2021   Mental health disorder 10/19/2021   Delirium 09/21/2021   Polypharmacy 09/18/2020   Anemia 04/16/2020   Anxiety 04/16/2020   Type 2 diabetes mellitus with diabetic polyneuropathy, with long-term current use of insulin ([place]) 02/09/2020   Type 2 diabetes mellitus with stage 3a chronic kidney disease, with long-term current use of insulin ([place]) 02/09/2020   Family history of premature CAD 01/27/2020   Stage 3b chronic kidney disease ([place]) 01/09/2020   Incontinence of feces 01/09/2020   Chronic pain of left knee 01/09/2020   Fall 01/09/2020   History of diverticulitis 10/30/2019   Noncompliance 06/10/2019   History of renal cell cancer 04/24/2018   Erectile dysfunction 04/24/2018   Edema 03/15/2018   Seizure disorder ([place]) 03/01/2018   History of stroke 11/29/2017   Obstructive sleep apnea 11/29/2017   Dyslipidemia associated with type 2 diabetes mellitus ([place]) 05/10/2017   Hypertension associated with diabetes ([place]) 05/10/2017   History of cerebrovascular accident (CVA) with residual deficit 05/10/2017   Diabetic ulcer of left foot associated with secondary diabetes mellitus ([place]) 04/21/2016   Depression, recurrent ([place]) 09/21/2014   Past Medical History:  Past Medical History:  Diagnosis Date   Acute ischemic stroke ([place]) 11/2013   Allergy    Anxiety    Arthritis    Benign hypertension with CKD (chronic kidney disease) stage III (HCC)    Bil Renal Ca dx'd 09/2011 & 11/2011   left and right; cryoablation bil   Depression    BH Adm in [place] Depression   Diabetes mellitus    DKA prior  hospitalization   Diverticulitis    s/p micorperforation Sept 2012-managed conservatively by Gen surgery   ED (erectile dysfunction)    Focal seizure ([place]) 11/2013   due to ischemic stroke   GERD (gastroesophageal reflux disease)    Hiatal hernia    Hyperlipidemia    Hypertension    Seizures ([place])    none since 2016, taking Keppra - maw   Sleep apnea    Thyroid disease    "weak thyroid" per MD   Wears glasses    Past Surgical History:  Past Surgical History:  Procedure Laterality Date   COLONOSCOPY     IR RADIOLOGIST EVAL & MGMT  04/04/2017   KIDNEY SURGERY     ablation of renal cell CA - 12/28, prior one was October [place]   TEE WITHOUT CARDIOVERSION N/A 11/19/2013   Procedure: TRANSESOPHAGEAL ECHOCARDIOGRAM (TEE);  Surgeon: Lelon Perla, MD;  Location[facility] ENDOSCOPY;  Service: Cardiovascular;  Laterality: N/A;   HPI:  Pt is a 60 y/o male admitted secondary to [place]. Found to be hypertensive and hyperglycemic. PMH includes DM, HTN, CVA with L deficits, schizophrenia, seizure, and CKD.   Assessment / Plan / Recommendation Clinical Impression  Pt demonstrates mild cognitive impairment that is likely part of pts baseline function given medical history. He is reluctant to participate in direct questions, but was able to participate in basic functional bed level with supervision. Pt is not fully oriented to date and situation, but has adequate general knowledge of recent events. Able to recall new information  with a few minutes delay. Pt will need supervision and assist at d/c, but not likely to benefit from ongoing SLP interventions.  Acute SLP services not needed at this time.    SLP Assessment  SLP Recommendation/Assessment: Patient does not need any further Speech [place] [facility] SLP Visit Diagnosis: Cognitive communication deficit (R41.841)    Recommendations for follow up therapy are one component of a multi-disciplinary discharge planning process, led  by the attending physician.  Recommendations may be updated based on patient status, additional functional criteria and insurance authorization.    Follow Up Recommendations  No SLP follow up    Assistance Recommended at Discharge     Functional Status Assessment    Frequency and Duration           SLP Evaluation Cognition  Overall Cognitive Status: History of cognitive impairments - at baseline Orientation Level: Oriented to person;Oriented to place;Disoriented to time;Oriented to situation Attention: Focused;Sustained;Alternating Focused Attention: Appears intact Sustained Attention: Appears intact Alternating Attention: Impaired Alternating Attention Impairment: Functional complex;Verbal complex Awareness: Appears intact Problem Solving: Impaired Problem Solving Impairment: Verbal complex;Functional complex Behaviors:  (defensive)       Comprehension  Auditory Comprehension Overall Auditory Comprehension: Appears within functional limits for tasks assessed    Expression Verbal Expression Overall Verbal Expression: Appears within functional limits for tasks assessed   Oral / Motor  Oral Motor/Sensory Function Overall Oral Motor/Sensory Function: Within functional limits Motor Speech Overall Motor Speech: Appears within functional limits for tasks assessed            Prentiss Hammett, Katherene Ponto 11/30/2021, 10:19 AM

## 2021-11-30 NOTE — Progress Notes (Addendum)
Patient ID: Brandon [name], male   DOB: 11-18-1961, 60 y.o.   MRN: [id number]  PROGRESS NOTE    Brandon [name]  QQV:[id number] DOB: 1961-04-27 DOA: 11/29/2021 PCP: Carlena Hurl, PA-C   Brief Narrative:  60 y.o. male with medical history significant of CVA with associated seizures (2014); chronic kidney disease status IV; renal cell cancer; diabetes mellitus type 2; GERD; HTN; HLD; OSA; and hypothyroidism presented with altered mental status.  On presentation, blood pressure was significantly elevated with systolic in the 332R, improved after labetalol.  Creatinine was 3.4, baseline around 2.7-2.9.  Neurology was consulted.  Assessment & Plan:   Acute metabolic encephalopathy -Patient has history of CVA and seizures and presented with altered mental status -CT of the head and MRI of the brain did not show any acute intracranial abnormality.  EEG was negative for seizures. -Neurology following and is of the opinion that patient's mental status was possibly from extremely high blood pressure and metabolic derangements. -Mental status improving. -PT/OT recommend SNF placement.  Social worker consult. -Monitor mental status.  Fall precautions.  Acute kidney injury on chronic kidney disease stage IV -Baseline creatinine is around 2.7-2.9 although creatinine a month ago was 2.3.  Presented with creatinine of 3.4.  Creatinine worsening to 3.98 today.  Start normal saline at 100 cc an hour.  Renal ultrasound.  Strict input and output.  Daily weight. -If renal function does not improve, might need nephrology evaluation  Seizure disorder -EEG as above.  Continue Keppra. -Seizure precautions  Diabetes mellitus type 2 with diabetic polyneuropathy -Recent A1c 8.4.  Continue long-acting insulin along with CBGs with SSI  Hypokalemia -Replace.  Repeat a.m. labs  Hyperlipidemia -continue statin and ezetimibe  Hypertension/hypertensive emergency -Patient presented with extremely elevated  blood pressure and was encephalopathy. -Blood pressure still on the higher side but improved.  Continue as needed IV hydralazine.  Add oral hydralazine and Norvasc  History of unspecified CVA with residual deficit -Neurology following.  Continue aspirin and statin.  Anxiety and depression-continue sertraline and quetiapine    DVT prophylaxis: Lovenox Code Status: Full Family Communication:  Disposition Plan: Status is: Observation  The patient will require care spanning > 2 midnights and should be moved to inpatient because: Of worsening renal function and need for IV fluids  Consultants: Neurology  Procedures: EEG  Antimicrobials: None   Subjective: Patient seen and examined at bedside.  Feels slightly better.  Complains of some left-sided flank pain.  No overnight fever or vomiting reported.  Objective: Vitals:   11/29/21 1700 11/29/21 2017 11/29/21 2356 11/30/21 0441  BP: 138/84 (!) 160/95 (!) 162/91 (!) 170/90  Pulse: 72 75 80 79  Resp: 13 20 18 14   Temp:  98.9 F (37.2 C) 98.4 F (36.9 C) 97.7 F (36.5 C)  TempSrc:  Oral Oral Oral  SpO2: 100% 100% 100% 100%  Weight:      Height:        Intake/Output Summary (Last 24 hours) at 11/30/2021 0941 Last data filed at 11/30/2021 5188 Gross per 24 hour  Intake 791.14 ml  Output --  Net 791.14 ml   Filed Weights   11/29/21 0000  Weight: 115.8 kg    Examination:  General exam: Appears calm and comfortable.  Poor historian.  Currently on room air. Respiratory system: Bilateral decreased breath sounds at bases Cardiovascular system: S1 & S2 heard, Rate controlled Gastrointestinal system: Abdomen is nondistended, soft and nontender. Normal bowel sounds heard. Extremities: No cyanosis, clubbing; trace lower  extremity edema Central nervous system: Awake, slow to respond but answers some questions.  No focal neurological deficits. Moving extremities Skin: No rashes, lesions or ulcers Psychiatry: Affect is mostly  flat.   Data Reviewed: I have personally reviewed following labs and imaging studies  CBC: Recent Labs  Lab 11/29/21 0228 11/29/21 0229 11/30/21 0836  WBC 5.1  --  7.1  NEUTROABS 3.9  --  4.2  HGB 16.1 6.1* 11.8*  HCT 44.4 18.0* 33.5*  MCV 82.5  --  84.4  PLT 145*  --  161   Basic Metabolic Panel: Recent Labs  Lab 11/29/21 0228 11/29/21 0229 11/29/21 0320 11/30/21 0836  NA 139 141  --  140  K 2.8* 2.8*  --  3.0*  CL 105 102  --  108  CO2 23  --   --  25  GLUCOSE 228* 230*  --  88  BUN 21* 23*  --  21*  CREATININE 3.38* 3.40*  --  3.98*  CALCIUM 8.5*  --   --  8.6*  MG  --   --  1.8 1.8   GFR: Estimated Creatinine Clearance: 27 mL/min (A) (by C-G formula based on SCr of 3.98 mg/dL (H)). Liver Function Tests: Recent Labs  Lab 11/29/21 0228 11/30/21 0836  AST 18 18  ALT 15 15  ALKPHOS 136* 122  BILITOT 1.1 1.0  PROT 6.5 6.5  ALBUMIN 3.1* 2.9*   No results for input(s): LIPASE, AMYLASE in the last 168 hours. No results for input(s): AMMONIA in the last 168 hours. Coagulation Profile: Recent Labs  Lab 11/29/21 0228  INR 1.2   Cardiac Enzymes: No results for input(s): CKTOTAL, CKMB, CKMBINDEX, TROPONINI in the last 168 hours. BNP (last 3 results) No results for input(s): PROBNP in the last 8760 hours. HbA1C: No results for input(s): HGBA1C in the last 72 hours. CBG: Recent Labs  Lab 11/29/21 0557 11/29/21 1428 11/29/21 1705 11/29/21 2150 11/30/21 0617  GLUCAP 158* 139* 128* 89 70   Lipid Profile: No results for input(s): CHOL, HDL, LDLCALC, TRIG, CHOLHDL, LDLDIRECT in the last 72 hours. Thyroid Function Tests: No results for input(s): TSH, T4TOTAL, FREET4, T3FREE, THYROIDAB in the last 72 hours. Anemia Panel: No results for input(s): VITAMINB12, FOLATE, FERRITIN, TIBC, IRON, RETICCTPCT in the last 72 hours. Sepsis Labs: No results for input(s): PROCALCITON, LATICACIDVEN in the last 168 hours.  Recent Results (from the past 240 hour(s))   Resp Panel by RT-PCR (Flu A&B, Covid) Nasopharyngeal Swab     Status: None   Collection Time: 11/29/21  5:34 PM   Specimen: Nasopharyngeal Swab; Nasopharyngeal(NP) swabs in vial transport medium  Result Value Ref Range Status   SARS Coronavirus 2 by RT PCR NEGATIVE NEGATIVE Final    Comment: (NOTE) SARS-CoV-2 target nucleic acids are NOT DETECTED.  The SARS-CoV-2 RNA is generally detectable in upper respiratory specimens during the acute phase of infection. The lowest concentration of SARS-CoV-2 viral copies this assay can detect is 138 copies/mL. A negative result does not preclude SARS-Cov-2 infection and should not be used as the sole basis for treatment or other patient management decisions. A negative result may occur with  improper specimen collection/handling, submission of specimen other than nasopharyngeal swab, presence of viral mutation(s) within the areas targeted by this assay, and inadequate number of viral copies(<138 copies/mL). A negative result must be combined with clinical observations, patient history, and epidemiological information. The expected result is Negative.  Fact Sheet for Patients:  EntrepreneurPulse.com.au  Fact Sheet  for Healthcare Providers:  IncredibleEmployment.be  This test is no t yet approved or cleared by the [place] and  has been authorized for detection and/or diagnosis of SARS-CoV-2 by FDA under an Emergency Use Authorization (EUA). This EUA will remain  in effect (meaning this test can be used) for the duration of the COVID-19 declaration under Section 564(b)(1) of the Act, 21 U.S.C.section 360bbb-3(b)(1), unless the authorization is terminated  or revoked sooner.       Influenza A by PCR NEGATIVE NEGATIVE Final   Influenza B by PCR NEGATIVE NEGATIVE Final    Comment: (NOTE) The Xpert Xpress SARS-CoV-2/FLU/RSV plus assay is intended as an aid in the diagnosis of influenza from  Nasopharyngeal swab specimens and should not be used as a sole basis for treatment. Nasal washings and aspirates are unacceptable for Xpert Xpress SARS-CoV-2/FLU/RSV testing.  Fact Sheet for Patients: EntrepreneurPulse.com.au  Fact Sheet for Healthcare Providers: IncredibleEmployment.be  This test is not yet approved or cleared by the [place] FDA and has been authorized for detection and/or diagnosis of SARS-CoV-2 by FDA under an Emergency Use Authorization (EUA). This EUA will remain in effect (meaning this test can be used) for the duration of the COVID-19 declaration under Section 564(b)(1) of the Act, 21 U.S.C. section 360bbb-3(b)(1), unless the authorization is terminated or revoked.  Performed at [facility], [place] [address], [place], [place] [address]          Radiology Studies: MR BRAIN WO CONTRAST  Result Date: 11/29/2021 CLINICAL DATA:  Follow-up stroke.  Acute neurological deficit. EXAM: MRI HEAD WITHOUT CONTRAST TECHNIQUE: Multiplanar, multiecho pulse sequences of the brain and surrounding structures were obtained without intravenous contrast. COMPARISON:  Head CT earlier same day.  MRI 07/05/2021. FINDINGS: Brain: Diffusion imaging does not show any acute or subacute infarction. No focal abnormality affects the brainstem or cerebellum. Left cerebral hemisphere shows mild chronic small-vessel ischemic change of the white matter. On the right, there is old infarction in the right MCA territory affecting the right temporal lobe, insula and temporoparietal junction. This is shows atrophy, encephalomalacia and gliosis. No evidence of recent extension. No sign of intracranial hemorrhage. No obstructive hydrocephalus. No extra-axial collection. Vascular: Major vessels at the base of the brain show flow. Skull and upper cervical spine: Negative Sinuses/Orbits: Clear/normal Other: None IMPRESSION: No change. No acute MR finding. Old  infarction in the right middle cerebral artery territory. Mild chronic small-vessel ischemic change elsewhere within the hemispheric white matter. Electronically Signed   By: Nelson Chimes M.D.   On: 11/29/2021 13:03   EEG adult now  Result Date: 11/29/2021 Lora Havens, MD     11/29/2021  4:04 PM Patient Name: Brandon [name] MRN: [id number] Epilepsy Attending: Lora Havens Referring Physician/Provider: Dr Karmen Bongo Date: 11/29/2021 Duration: 22.59 mins Patient history: 60 y.o. male with PMH significant for history of stroke in 2013 with residual mild left-sided weakness, bilateral renal cancer, depression, diabetes with prior history of DKA, hyperlipidemia, hypertension, focal seizures on Keppra who presents with headache, agitation and encephalopathy in the setting of severe hyperglycemia and malignant hypertension. EEG to evaluate for seizure Level of alertness: Awake, asleep AEDs during EEG study: LEV Technical aspects: This EEG study was done with scalp electrodes positioned according to the 10-20 International system of electrode placement. Electrical activity was acquired at a sampling rate of 500Hz  and reviewed with a high frequency filter of 70Hz  and a low frequency filter of 1Hz . EEG data were recorded  continuously and digitally stored. Description: The posterior dominant rhythm consists of 7.5 Hz activity of moderate voltage (25-35 uV) seen predominantly in posterior head regions, symmetric and reactive to eye opening and eye closing. Sleep was characterized by vertex waves, sleep spindles (12 to 14 Hz), maximal frontocentral region. EEG also showed 5-7hz  theta slowing in right hemisphere. Hyperventilation and photic stimulation were not performed.   ABNORMALITY - Continuous slow, right hemisphere IMPRESSION: This study is suggestive of cortical dysfunction in right hemisphere, likely due to underlying encephalomalacia. No seizures or definite epileptiform discharges were seen  throughout the recording. Lora Havens   CT HEAD CODE STROKE WO CONTRAST  Result Date: 11/29/2021 CLINICAL DATA:  Code stroke.  Acute neurologic deficit EXAM: CT HEAD WITHOUT CONTRAST TECHNIQUE: Contiguous axial images were obtained from the base of the skull through the vertex without intravenous contrast. COMPARISON:  None. FINDINGS: Brain: There is no mass, hemorrhage or extra-axial collection. There is generalized atrophy without lobar predilection. There is hypoattenuation of the periventricular white matter, most commonly indicating chronic ischemic microangiopathy. Unchanged appearance of old right MCA territory infarct. Vascular: No abnormal hyperdensity of the major intracranial arteries or dural venous sinuses. No intracranial atherosclerosis. Skull: The visualized skull base, calvarium and extracranial soft tissues are normal. Sinuses/Orbits: No fluid levels or advanced mucosal thickening of the visualized paranasal sinuses. No mastoid or middle ear effusion. The orbits are normal. ASPECTS [facility] Stroke Program Early CT Score) - Ganglionic level infarction (caudate, lentiform nuclei, internal capsule, insula, M1-M3 cortex): 7 - Supraganglionic infarction (M4-M6 cortex): 3 Total score (0-10 with 10 being normal): 10 IMPRESSION: 1. No acute intracranial abnormality. 2. ASPECTS is 10. 3. Old right MCA territory infarct and chronic ischemic microangiopathy. These results were communicated to Dr. Donnetta Simpers at 12:37 am on 11/29/2021 by text page via the [facility] messaging system. Electronically Signed   By: Ulyses Jarred M.D.   On: 11/29/2021 00:41        Scheduled Meds:  aspirin EC  81 mg Oral Daily   atorvastatin  40 mg Oral Daily   enoxaparin (LOVENOX) injection  40 mg Subcutaneous Q24H   ezetimibe  10 mg Oral Daily   insulin aspart  0-15 Units Subcutaneous TID WC   insulin aspart  0-5 Units Subcutaneous QHS   insulin glargine-yfgn  40 Units Subcutaneous Daily   levETIRAcetam   500 mg Oral BID   QUEtiapine  25 mg Oral QHS   sertraline  25 mg Oral Daily   Continuous Infusions:  sodium chloride 50 mL/hr at 11/29/21 2212          Aline August, MD [facility] 11/30/2021, 9:41 AM

## 2021-11-30 NOTE — Evaluation (Signed)
Occupational Therapy Evaluation Patient Details Name: Brandon [name] MRN: [id number] DOB: 1961-01-05 Today's Date: 11/30/2021   History of Present Illness Pt is a 60 y/o male admitted secondary to AMS. Found to be hypertensive and hyperglycemic. PMH includes DM, HTN, CVA with L deficits, schizophrenia, seizure, and CKD.   Clinical Impression   Brandon [name] was mod I with BADLs and mobility PTA and had assist for IADLs, he does not drive. He lives in an apartment, [address] with a roommate. Pt was frustrated with sharing home set up &  level of assist at the time of evaluation, therefore limited info was obtained. Upon evaluation he was limited by L extremity weakness, cognition and impaired balance. Overall he required supervision for bed mobility, close min guard for functional ambulation with RW and up to min A for ADLs. He required gentle encouragement for participation and verbal cues for sequencing and problem solving. Per prior notes, pt's roommate is requesting pt d/c to SNF. Based on current level of function, recommend SNF at d/c. *However, if pt has 24/7 direct physical and cognitive assist at d/c he could d/c to home with [place]. OT to follow acutely.      Recommendations for follow up therapy are one component of a multi-disciplinary discharge planning process, led by the attending physician.  Recommendations may be updated based on patient status, additional functional criteria and insurance authorization.   Follow Up Recommendations  Skilled nursing-short term rehab (<3 hours/day)    Assistance Recommended at Discharge Frequent or constant Supervision/Assistance  Functional Status Assessment  Patient has had a recent decline in their functional status and demonstrates the ability to make significant improvements in function in a reasonable and predictable amount of time.  Equipment Recommendations  None recommended by OT       Precautions / Restrictions Precautions Precautions:  Fall Restrictions Weight Bearing Restrictions: No      Mobility Bed Mobility Overal bed mobility: Needs Assistance Bed Mobility: Supine to Sit     Supine to sit: Supervision     General bed mobility comments: +incrased time    Transfers Overall transfer level: Needs assistance Equipment used: Rolling walker (2 wheels) Transfers: Sit to/from Stand Sit to Stand: Min guard                  Balance Overall balance assessment: Needs assistance Sitting-balance support: No upper extremity supported;Feet supported Sitting balance-Leahy Scale: Fair     Standing balance support: Single extremity supported;During functional activity Standing balance-Leahy Scale: Fair           ADL either performed or assessed with clinical judgement   ADL Overall ADL's : Needs assistance/impaired Eating/Feeding: Independent;Sitting   Grooming: Min guard;Standing   Upper Body Bathing: Set up;Sitting   Lower Body Bathing: Sit to/from stand;Minimal assistance   Upper Body Dressing : Set up;Sitting   Lower Body Dressing: Minimal assistance;Sit to/from stand   Toilet Transfer: Min guard;Rolling walker (2 wheels);Ambulation   Toileting- Clothing Manipulation and Hygiene: Min guard;Sit to/from stand       Functional mobility during ADLs: Min guard;Rolling walker (2 wheels) General ADL Comments: requires cues for safety. Pt's L side is generally weaker, and required assist this session, however questionable effort given on eval     Vision Baseline Vision/History: 1 Wears glasses Ability to See in Adequate Light: 0 Adequate Patient Visual Report: No change from baseline;Peripheral vision impairment Vision Assessment?: Yes;Vision impaired- to be further tested in functional context Eye Alignment: Within Functional Limits Additional  Comments: pt declined further vision assessment. He states peripheral vision is impaired from prior strokes, and his L eye is "blurry"             Pertinent Vitals/Pain Pain Assessment: Faces Faces Pain Scale: Hurts a little bit Pain Location: LLE Pain Descriptors / Indicators: Discomfort Pain Intervention(s): Monitored during session     Hand Dominance     Extremity/Trunk Assessment Upper Extremity Assessment Upper Extremity Assessment: LUE deficits/detail LUE Deficits / Details: 4/5 MMT, ROM is WFL. Pt describes LUE as "heavy" LUE Sensation: decreased light touch LUE Coordination: decreased fine motor   Lower Extremity Assessment Lower Extremity Assessment: Defer to PT evaluation   Cervical / Trunk Assessment Cervical / Trunk Assessment: Normal   Communication Communication Communication: No difficulties   Cognition Arousal/Alertness: Awake/alert Behavior During Therapy: WFL for tasks assessed/performed Overall Cognitive Status: History of cognitive impairments - at baseline           General Comments: pt follows all commands with increased time. He is easily frustrated and requires gentle encouragement to participate. oriented to self and place, and can generally speak to situation     General Comments  VSS on RA            Home Living Family/patient expects to be discharged to:: Private residence Living Arrangements: Non-relatives/Friends Available Help at Discharge: Friend(s);Available PRN/intermittently Type of Home: Apartment Home Access: Stairs to enter Entrance Stairs-Number of Steps: 12 Entrance Stairs-Rails: Right;Left Home Layout: Two level Alternate Level Stairs-Number of Steps: 10 Alternate Level Stairs-Rails: Right;Left Bathroom Shower/Tub: Tub/shower unit;Walk-in shower   Bathroom Toilet: Standard Bathroom Accessibility: No   Home Equipment: Conservation officer, nature (2 wheels);Cane - single point   Additional Comments: pt states he lives with a roommate and they both sleep in recliners  Lives With: Significant other    Prior Functioning/Environment Prior Level of Function :  Independent/Modified Independent             Mobility Comments: Uses cane mostly and sometimes RW          OT Problem List: Decreased strength;Decreased range of motion;Decreased activity tolerance;Impaired balance (sitting and/or standing);Decreased coordination;Decreased safety awareness;Decreased knowledge of use of DME or AE;Decreased knowledge of precautions;Pain      OT Treatment/Interventions: Self-care/ADL training;Therapeutic exercise;Balance training;Patient/family education;Therapeutic activities;DME and/or AE instruction    OT Goals(Current goals can be found in the care plan section) Acute Rehab OT Goals Patient Stated Goal: home OT Goal Formulation: With patient Time For Goal Achievement: 12/14/21 Potential to Achieve Goals: Good ADL Goals Pt Will Perform Grooming: with modified independence;standing Pt Will Perform Lower Body Dressing: with set-up;sit to/from stand Pt Will Transfer to Toilet: with modified independence;ambulating Pt/caregiver will Perform Home Exercise Program: Increased ROM;Increased strength;Left upper extremity;With written HEP provided Additional ADL Goal #1: Pt will indep complete 3 step way-finding task Additional ADL Goal #2: Pt will recall at least 3 fall prevention strategies to implement into his home environment  OT Frequency: Min 2X/week    AM-PAC OT "6 Clicks" Daily Activity     Outcome Measure Help from another person eating meals?: None Help from another person taking care of personal grooming?: A Little Help from another person toileting, which includes using toliet, bedpan, or urinal?: A Little Help from another person bathing (including washing, rinsing, drying)?: A Little Help from another person to put on and taking off regular upper body clothing?: A Little Help from another person to put on and taking off regular lower body clothing?: A Little  6 Click Score: 19   End of Session Equipment Utilized During Treatment: Gait  belt;Rolling walker (2 wheels) Nurse Communication: Mobility status  Activity Tolerance: Patient tolerated treatment well Patient left: in chair;with bed alarm set;with chair alarm set  OT Visit Diagnosis: Unsteadiness on feet (R26.81);Other abnormalities of gait and mobility (R26.89);Muscle weakness (generalized) (M62.81);Pain;Hemiplegia and hemiparesis Hemiplegia - Right/Left: Left Hemiplegia - dominant/non-dominant: Non-Dominant Hemiplegia - caused by: Cerebral infarction                Time: 0131-4388 OT Time Calculation (min): 21 min Charges:  OT General Charges $OT Visit: 1 Visit OT Evaluation $OT Eval Moderate Complexity: 1 Mod  Aldora Perman A Tanda Morrissey 11/30/2021, 9:05 AM

## 2021-11-30 NOTE — TOC Initial Note (Signed)
Transition of Care [facility]) - Initial/Assessment Note    Patient Details  Name: Brandon [name] MRN: [id number] Date of Birth: [birth year]  Transition of Care [facility]) CM/SW Contact:    Pollie Friar, RN Phone Number: 11/30/2021, 12:25 PM  Clinical Narrative:                 Patient is from home with a friend. He is responsible for his own medications and denies any issues. Pt states his friend provides needed transportation.  Recommendations are for SNF rehab. Pt is in agreement and would like to be faxed out in the [facility] area.  CM will provide bed offers once available.   Expected Discharge Plan: Skilled Nursing Facility Barriers to Discharge: Continued Medical Work up   Patient Goals and CMS Choice   CMS Medicare.gov Compare Post Acute Care list provided to:: Patient Choice offered to / list presented to : Patient  Expected Discharge Plan and Services Expected Discharge Plan: [place] In-house Referral: Clinical Social Work Discharge Planning Services: CM Consult Post Acute Care Choice: [place] Living arrangements for the past 2 months: Apartment                                      Prior Living Arrangements/Services Living arrangements for the past 2 months: Apartment Lives with:: Friends Patient language and need for interpreter reviewed:: Yes Do you feel safe going back to the place where you live?: Yes          Current home services: DME (cane / walker) Criminal Activity/Legal Involvement Pertinent to Current Situation/Hospitalization: No - Comment as needed  Activities of Daily Living      Permission Sought/Granted                  Emotional Assessment Appearance:: Appears stated age Attitude/Demeanor/Rapport: Engaged Affect (typically observed): Accepting Orientation: : Oriented to Self, Oriented to Place, Oriented to  Time, Oriented to Situation   Psych Involvement: No (comment)  Admission  diagnosis:  Encephalopathy [G93.40] NSTEMI (non-ST elevated myocardial infarction) ([place]) [I21.4] Hypertensive crisis without congestive heart failure [I16.9] AKI (acute kidney injury) ([place]) [N17.9] AMS (altered mental status) [R41.82] Patient Active Problem List   Diagnosis Date Noted   AKI (acute kidney injury) ([place]) 11/30/2021   AMS (altered mental status) 11/29/2021   Hypokalemia 11/29/2021   Callus 10/19/2021   Enlarged and hypertrophic nails 10/19/2021   Open wound of toe 10/19/2021   Mental health disorder 10/19/2021   Delirium 09/21/2021   Polypharmacy 09/18/2020   Anemia 04/16/2020   Anxiety 04/16/2020   Type 2 diabetes mellitus with diabetic polyneuropathy, with long-term current use of insulin ([place]) 02/09/2020   Type 2 diabetes mellitus with stage 3a chronic kidney disease, with long-term current use of insulin ([place]) 02/09/2020   Family history of premature CAD 01/27/2020   Stage 3b chronic kidney disease ([place]) 01/09/2020   Incontinence of feces 01/09/2020   Chronic pain of left knee 01/09/2020   Fall 01/09/2020   History of diverticulitis 10/30/2019   Noncompliance 06/10/2019   History of renal cell cancer 04/24/2018   Erectile dysfunction 04/24/2018   Edema 03/15/2018   Seizure disorder ([place]) 03/01/2018   History of stroke 11/29/2017   Obstructive sleep apnea 11/29/2017   Dyslipidemia associated with type 2 diabetes mellitus ([place]) 05/10/2017   Hypertension associated with diabetes ([place]) 05/10/2017   History of cerebrovascular accident (  CVA) with residual deficit 05/10/2017   Diabetic ulcer of left foot associated with secondary diabetes mellitus ([place]) 04/21/2016   Depression, recurrent ([place]) 09/21/2014   PCP:  Carlena Hurl, PA-C Pharmacy:   [id number] [place], [address] [place] [address] Phone: [phone] Fax: [phone]     Social Determinants of Health (SDOH) Interventions    Readmission Risk  Interventions Readmission Risk Prevention Plan 07/09/2021  Transportation Screening Complete  PCP or Specialist Appt within 3-5 Days Complete  HRI or [place] Complete  Social Work Consult for [place] Planning/Counseling Complete  Palliative Care Screening Not Applicable  Some recent data might be hidden

## 2021-11-30 NOTE — NC FL2 (Signed)
[place] LEVEL OF CARE SCREENING TOOL     IDENTIFICATION  Patient Name: Brandon [name] Birthdate: [birth year] Sex: male Admission Date (Current Location): 11/29/2021  [facility] and [place] Number:  Herbalist and Address:  [facility], [place] [address] [place], [place] [address]      Provider Number: [id number]  Attending Physician Name and Address:  Aline August, MD  Relative Name and Phone Number:       Current Level of Care: Hospital Recommended Level of Care: [place] Prior Approval Number:    Date Approved/Denied:   PASRR Number: [id number]  Discharge Plan: SNF    Current Diagnoses: Patient Active Problem List   Diagnosis Date Noted   AKI (acute kidney injury) ([place]) 11/30/2021   AMS (altered mental status) 11/29/2021   Hypokalemia 11/29/2021   Callus 10/19/2021   Enlarged and hypertrophic nails 10/19/2021   Open wound of toe 10/19/2021   Mental health disorder 10/19/2021   Delirium 09/21/2021   Polypharmacy 09/18/2020   Anemia 04/16/2020   Anxiety 04/16/2020   Type 2 diabetes mellitus with diabetic polyneuropathy, with long-term current use of insulin ([place]) 02/09/2020   Type 2 diabetes mellitus with stage 3a chronic kidney disease, with long-term current use of insulin ([place]) 02/09/2020   Family history of premature CAD 01/27/2020   Stage 3b chronic kidney disease ([place]) 01/09/2020   Incontinence of feces 01/09/2020   Chronic pain of left knee 01/09/2020   Fall 01/09/2020   History of diverticulitis 10/30/2019   Noncompliance 06/10/2019   History of renal cell cancer 04/24/2018   Erectile dysfunction 04/24/2018   Edema 03/15/2018   Seizure disorder ([place]) 03/01/2018   History of stroke 11/29/2017   Obstructive sleep apnea 11/29/2017   Dyslipidemia associated with type 2 diabetes mellitus ([place]) 05/10/2017   Hypertension associated with diabetes ([place]) 05/10/2017   History of cerebrovascular  accident (CVA) with residual deficit 05/10/2017   Diabetic ulcer of left foot associated with secondary diabetes mellitus ([place]) 04/21/2016   Depression, recurrent ([place]) 09/21/2014    Orientation RESPIRATION BLADDER Height & Weight     Self, Time, Situation, Place  Normal Continent Weight: 115.8 kg Height:  6\' 1"  (185.4 cm)  BEHAVIORAL SYMPTOMS/MOOD NEUROLOGICAL BOWEL NUTRITION STATUS      Continent Diet (heart healthy/ carb modified with thin liquids)  AMBULATORY STATUS COMMUNICATION OF NEEDS Skin   Extensive Assist Verbally Normal                       Personal Care Assistance Level of Assistance  Bathing, Feeding, Dressing Bathing Assistance: Limited assistance Feeding assistance: Independent Dressing Assistance: Limited assistance     Functional Limitations Info  Sight, Hearing, Speech Sight Info: Adequate Hearing Info: Adequate Speech Info: Adequate    SPECIAL CARE FACTORS FREQUENCY  PT (By licensed PT), OT (By licensed OT)     PT Frequency: 5x/wk OT Frequency: 5x/wk            Contractures Contractures Info: Not present    Additional Factors Info  Code Status, Allergies, Psychotropic, Insulin Sliding Scale Code Status Info: Full Allergies Info: Morphine Psychotropic Info: Seroquel 25 mg at bedtime/ Zoloft 25 mg daily Insulin Sliding Scale Info: semglee 40 units SQ daily/ Novolog 0-15 units SQ TID/ Novolog 0-5 units SQ at bedtime       Current Medications (11/30/2021):  This is the current hospital active medication list Current Facility-Administered Medications  Medication Dose Route  Frequency Provider Last Rate Last Admin   0.9 %  sodium chloride infusion   Intravenous Continuous Starla Link, Kshitiz, MD 100 mL/hr at 11/30/21 1140 New Bag at 11/30/21 1140   acetaminophen (TYLENOL) tablet 650 mg  650 mg Oral Q4H PRN Karmen Bongo, MD       Or   acetaminophen (TYLENOL) 160 MG/5ML solution 650 mg  650 mg Per Tube Q4H PRN Karmen Bongo, MD       Or    acetaminophen (TYLENOL) suppository 650 mg  650 mg Rectal Q4H PRN Karmen Bongo, MD       amLODipine (NORVASC) tablet 10 mg  10 mg Oral Daily Aline August, MD   10 mg at 11/30/21 1141   aspirin EC tablet 81 mg  81 mg Oral Daily Karmen Bongo, MD   81 mg at 11/30/21 1141   atorvastatin (LIPITOR) tablet 40 mg  40 mg Oral Daily Karmen Bongo, MD   40 mg at 11/30/21 1141   enoxaparin (LOVENOX) injection 40 mg  40 mg Subcutaneous Q24H Karmen Bongo, MD   40 mg at 11/30/21 1150   ezetimibe (ZETIA) tablet 10 mg  10 mg Oral Daily Karmen Bongo, MD   10 mg at 11/30/21 1141   hydrALAZINE (APRESOLINE) injection 5 mg  5 mg Intravenous Q4H PRN Karmen Bongo, MD       hydrALAZINE (APRESOLINE) tablet 100 mg  100 mg Oral Q8H Alekh, Kshitiz, MD       insulin aspart (novoLOG) injection 0-15 Units  0-15 Units Subcutaneous TID WC Karmen Bongo, MD   3 Units at 11/30/21 1150   insulin aspart (novoLOG) injection 0-5 Units  0-5 Units Subcutaneous QHS Karmen Bongo, MD       insulin glargine-yfgn [facility]) injection 40 Units  40 Units Subcutaneous Daily Karmen Bongo, MD   40 Units at 11/30/21 1141   levETIRAcetam (KEPPRA) tablet 500 mg  500 mg Oral BID Karmen Bongo, MD   500 mg at 11/30/21 1141   LORazepam (ATIVAN) injection 4 mg  4 mg Intravenous Q5 Min x 2 PRN Karmen Bongo, MD       potassium chloride SA (KLOR-CON M) CR tablet 40 mEq  40 mEq Oral Q4H Alekh, Kshitiz, MD   40 mEq at 11/30/21 1140   QUEtiapine (SEROQUEL) tablet 25 mg  25 mg Oral Ivery Quale, MD   25 mg at 11/29/21 2212   senna-docusate (Senokot-S) tablet 1 tablet  1 tablet Oral QHS PRN Karmen Bongo, MD       sertraline (ZOLOFT) tablet 25 mg  25 mg Oral Daily Karmen Bongo, MD   25 mg at 11/30/21 1140     Discharge Medications: Please see discharge summary for a list of discharge medications.  Relevant Imaging Results:  Relevant Lab Results:   Additional Information SS#: [id number]  Pollie Friar,  RN

## 2021-11-30 NOTE — Discharge Instructions (Signed)
[place] Hospital Stay Proper nutrition can help your body recover from illness and injury.   Foods and beverages high in protein, vitamins, and minerals help rebuild muscle loss, promote healing, & reduce fall risk.   In addition to eating healthy foods, a nutrition shake is an easy, delicious way to get the nutrition you need during and after your hospital stay  It is recommended that you continue to drink 2 bottles per day of: Ensure for at least 1 month (30 days) after your hospital stay   Tips for adding a nutrition shake into your routine: As allowed, drink one with vitamins or medications instead of water or juice Enjoy one as a tasty mid-morning or afternoon snack Drink cold or make a milkshake out of it Drink one instead of milk with cereal or snacks Use as a coffee creamer   Available at the following grocery stores and pharmacies:           * [place] [phone]            For COUPONS visit: www.ensure.com/join or http://dawson-may.com/   Suggested Substitutions Ensure Plus = Boost Plus = Carnation Breakfast Essentials = Boost Compact Ensure Active Clear = Boost Breeze Glucerna Shake = Boost Glucose Control = Carnation Breakfast Essentials SUGAR FREE    Plate Method for Diabetes   Foods with carbohydrates make your blood glucose level go up. The plate method is a simple way to meal plan and control the amount of carbohydrate you eat.         Use the following guidance to build a healthy plate to control carbohydrates. Divide a 9-inch plate into 3 sections, and consider your beverage the 4th section of your meal: Food Group Examples of Foods/Beverages for This Section of your Meal  Section 1: Non-starchy vegetables Fill  of your plate to include non-starchy vegetables Asparagus, broccoli,  brussels sprouts, cabbage, carrots, cauliflower, celery, cucumber, green beans, mushrooms, peppers, salad greens, tomatoes, or zucchini.  Section 2: Protein foods Fill  of your plate to include a lean protein Lean meat, poultry, fish, seafood, cheese, eggs, lean deli meat, tofu, beans, lentils, nuts or nut butters.  Section 3: Carbohydrate foods Fill  of your plate to include carbohydrate foods Whole grains, whole wheat bread, brown rice, whole grain pasta, polenta, corn tortillas, fruit, or starchy vegetables (potatoes, green peas, corn, beans, acorn squash, and butternut squash). One cup of milk also counts as a food that contains carbohydrate.  Section 4: Beverage Choose water or a low-calorie drink for your beverage. Unsweetened tea, coffee, or flavored/sparkling water without added sugar.  Image reprinted with permission from The American Diabetes Association.  Copyright 2022 by the American Diabetes Association.   Copyright 2022  Academy of Nutrition and Dietetics. All rights reserved

## 2021-11-30 NOTE — Progress Notes (Signed)
Patient c/o mild-to-moderate pain. Rating has been 3 or 4/10 flank pain. Patient explains that it is chronic from his renal cancer. We discussed how taking NSAIDs is not ideal. I offered tylenol, which he refused and explained that "it makes it worse for me" and his doctor advised him not to take it.   Other comfort measures have been used at this time such as distraction, rest, emotional support and repositioning.

## 2021-11-30 NOTE — Progress Notes (Signed)
Nutrition Follow-up  DOCUMENTATION CODES:   Severe malnutrition in context of acute illness/injury, Obesity unspecified  INTERVENTION:  - Recommend diet liberalization from heart healthy/carb modified to regular diet to increase nutritional adequacy; spoke with MD who agrees  - Ensure Enlive po BID, each supplement provides 350 kcal and 20 grams of protein  - MVI with minerals daily  - Spoke with pt about balanced nutrition for blood sugar control, provided "Plate Method" in AVS as discussed with pt  NUTRITION DIAGNOSIS:   Severe Malnutrition related to acute illness as evidenced by moderate muscle depletion, percent weight loss, energy intake < 75% for > 7 days.   GOAL:   Patient will meet greater than or equal to 90% of their needs  MONITOR:   PO intake, Supplement acceptance, Labs, Weight trends  REASON FOR ASSESSMENT:   Consult Assessment of nutrition requirement/status  ASSESSMENT:   Pt admitted from home with AMS likely secondary to high blood pressure and metabolic derangements. PMH includes CVA with associated seizures, CKD III, renal cell cancer, T2DM, GERD, HTN, HLD, OSA, hypothyroidism.  Noted plans for d/c to SNF when medically able.  Pt reports having no appetite and only eating 1 meal per day within the past 3 weeks which he attributes to "just not feeling good." When he does feel hungry, he will usually take a few bites of food and not finish it. He states that he eats out quite often but understands that foods from restaurants are typically higher in sodium and saturated fats. He enjoys foods such as baked fish, fruit, apple sauce, yogurt and oatmeal. We discussed the importance of balanced nutrition, including the Plate Method, for blood sugar control and food items from each food group. Made suggestion for pt to keep snacks on hand to have throughout the day if he does not feel like eating a full meal. He reports that he is mindful of his sodium intake and  that he will focus more on baked and grilled meat options.   He reports a usual body weight around 273-278 lbs and suspects recent weight loss but unsure of how much. Weight documented as 272 lbs in August and a current weight of 255 lbs. This is a 9% weight loss within the last month. This is significant for time frame.   Pt meets criteria for acute malnutrition, however suspect underlying chronic malnutrition.  Medications: SSI, semglee 40 units daily  Labs: potassium 3.0 (replacing), BUN 21, Cr 3.98 HgbA1c 8.4% (09/14/21), CBG's 70-158 x 24 hours  NUTRITION - FOCUSED PHYSICAL EXAM:  Flowsheet Row Most Recent Value  Orbital Region No depletion  Upper Arm Region No depletion  Thoracic and Lumbar Region No depletion  Buccal Region Mild depletion  Temple Region Mild depletion  Clavicle Bone Region No depletion  Clavicle and Acromion Bone Region No depletion  Scapular Bone Region No depletion  Dorsal Hand No depletion  Patellar Region Moderate depletion  Anterior Thigh Region Moderate depletion  Posterior Calf Region Moderate depletion  Edema (RD Assessment) None  Hair Reviewed  Eyes Reviewed  Mouth Reviewed  Skin Reviewed  Nails Reviewed      Diet Order:   Diet Order             Diet regular Room service appropriate? Yes; Fluid consistency: Thin  Diet effective now                   EDUCATION NEEDS:   Education needs have been addressed  Skin:  Skin  Assessment: Reviewed RN Assessment  Last BM:  11/28/21  Height:   Ht Readings from Last 1 Encounters:  11/29/21 6\' 1"  (1.854 m)    Weight:   Wt Readings from Last 1 Encounters:  11/29/21 115.8 kg    Ideal Body Weight:  83.6 kg  BMI:  Body mass index is 33.68 kg/m.  Estimated Nutritional Needs:   Kcal:  2300-2500  Protein:  140-160g  Fluid:  >/=2.3L  Clayborne Dana, RDN, LDN Clinical Nutrition

## 2021-12-01 DIAGNOSIS — N179 Acute kidney failure, unspecified: Principal | ICD-10-CM

## 2021-12-01 DIAGNOSIS — E43 Unspecified severe protein-calorie malnutrition: Secondary | ICD-10-CM | POA: Insufficient documentation

## 2021-12-01 LAB — BASIC METABOLIC PANEL
Anion gap: 8 (ref 5–15)
BUN: 25 mg/dL — ABNORMAL HIGH (ref 6–20)
CO2: 20 mmol/L — ABNORMAL LOW (ref 22–32)
Calcium: 8.6 mg/dL — ABNORMAL LOW (ref 8.9–10.3)
Chloride: 113 mmol/L — ABNORMAL HIGH (ref 98–111)
Creatinine, Ser: 3.81 mg/dL — ABNORMAL HIGH (ref 0.61–1.24)
GFR, Estimated: 18 mL/min — ABNORMAL LOW (ref >60)
Glucose, Bld: 69 mg/dL — ABNORMAL LOW (ref 70–99)
Potassium: 3.7 mmol/L (ref 3.5–5.1)
Sodium: 141 mmol/L (ref 135–145)

## 2021-12-01 LAB — GLUCOSE, CAPILLARY
Glucose-Capillary: 56 mg/dL — ABNORMAL LOW (ref 70–99)
Glucose-Capillary: 110 mg/dL — ABNORMAL HIGH (ref 70–99)
Glucose-Capillary: 87 mg/dL (ref 70–99)
Glucose-Capillary: 85 mg/dL (ref 70–99)
Glucose-Capillary: 90 mg/dL (ref 70–99)

## 2021-12-01 MED ORDER — ONDANSETRON HCL 4 MG/2ML IJ SOLN
4.0000 mg | Freq: Four times a day (QID) | INTRAMUSCULAR | Status: DC | PRN
  Administered 2021-12-01: 09:00:00 4 mg via INTRAVENOUS
  Filled 2021-12-01: qty 2

## 2021-12-01 MED ORDER — OXYCODONE HCL 5 MG PO TABS
5.0000 mg | ORAL_TABLET | Freq: Four times a day (QID) | ORAL | Status: DC | PRN
  Administered 2021-12-01 – 2021-12-04 (×5): 5 mg via ORAL
  Filled 2021-12-01 (×5): qty 1

## 2021-12-01 NOTE — TOC Progression Note (Addendum)
Transition of Care (TOC) - Progression Note  ° ° °Patient Details  °Name: Brandon [name] °MRN: [id number] °Date of Birth: 10/08/1961 ° °Transition of Care (TOC) CM/SW Contact  ° F , RN °Phone Number: °12/01/2021, 10:35 AM ° °Clinical Narrative:    °Patient provided bed offers for SNF rehab. He wants to discuss the options with his friend, Lisa. CM will see him again later today for the decision.  °TOC following. ° °1500: CM met with the patient and he gave CM permission to call his SO, Lisa. CM called Lisa and she picked [facility]. CM has updated the facility. ° ° °Expected Discharge Plan: Skilled Nursing Facility °Barriers to Discharge: Continued Medical Work up ° °Expected Discharge Plan and Services °Expected Discharge Plan: Skilled Nursing Facility °In-house Referral: Clinical Social Work °Discharge Planning Services: CM Consult °Post Acute Care Choice: Skilled Nursing Facility °Living arrangements for the past 2 months: Apartment °                °  °  °  °  °  °  °  °  °  °  ° ° °Social Determinants of Health (SDOH) Interventions °  ° °Readmission Risk Interventions °Readmission Risk Prevention Plan 07/09/2021  °Transportation Screening Complete  °PCP or Specialist Appt within 3-5 Days Complete  °HRI or Home Care Consult Complete  °Social Work Consult for Recovery Care Planning/Counseling Complete  °Palliative Care Screening Not Applicable  °Some recent data might be hidden  ° ° °

## 2021-12-01 NOTE — Progress Notes (Signed)
Hypoglycemic Event  CBG: 56  Treatment: 4 oz juice/soda  Symptoms:  weak  Follow-up CBG: Time:0641 CBG Result:110  Possible Reasons for Event: Unknown  Comments/MD notified:yes    Brandon [name]

## 2021-12-01 NOTE — Progress Notes (Signed)
PROGRESS NOTE  Brandon [name] JQG:[id number] DOB: [birth year] DOA: 11/29/2021 PCP: Carlena Hurl, PA-C   LOS: 1 day   Brief Narrative / Interim history: 60 year old male with history of prior CVA, seizures, chronic kidney disease stage IV, RCC, DM2, HTN, HLD, hypothyroidism comes to the hospital with confusion.  He was quite hypertensive on admission with blood pressure in the 197J systolic.  [name] with a creatinine of 3.4 from baseline of 2.7-2.9.  Due to confusion neurology was consulted and he was admitted to the hospital.  Subjective / 24h Interval events: Reports nausea today, has had several episodes of vomiting.  Assessment & Plan: Principal Problem Acute metabolic encephalopathy, weakness-patient with known history of prior CVA and seizures, came to the hospital with confusion.  He underwent a CT scan of the head as well as MRI which did not show any acute intracranial abnormalities.  EEG was negative for seizures.  Neurology consulted, possibly has been altered mental status from hypertensive emergency/metabolic derangements with AKI.  Currently his mental status appears to be improving.  [name] and recommended SNF placement  Active Problems Acute kidney injury, on chronic kidney disease stage IV-Baseline creatinine 2.7-2.9, although creatinine a month ago was 2.3.  Creatinine was as high as 3.98, currently plateauing and starts to get a little bit better at 3.8.  Renal ultrasound was without hydronephrosis or acute abnormalities.  Continue fluids for now  Left flank pain-acute on chronic, thinks it is due to "nerve pain from his kidneys" after having procedure done in the past  Nausea, vomiting-supportive management, possible due to AKI/hypertensive emergency.  Based on prior notes this is a chronic component to his  History of bilateral renal cell carcinomas-status post percutaneous ablation in the past, 2012  Seizure  disorder -EEG as above.  Continue Keppra.   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  4.  Continue long-acting insulin along with CBGs with SSI  CBG (last 3)  Recent Labs    11/30/21 2139 12/01/21 0618 12/01/21 0647  GLUCAP 208* 56* 110*   Hypokalemia -Replace.  Repeat a.m. labs  Hyperlipidemia -continue statin and ezetimibe  Hypertension/hypertensive emergency -Patient presented with extremely elevated blood pressure and was encephalopathy.  Blood pressure improved, currently on amlodipine 10 mg, hydralazine 100 mg every 8   History of unspecified CVA with residual deficit -Continue aspirin and statin.  Anxiety and depression-continue sertraline and quetiapine  Scheduled Meds:  amLODipine  10 mg Oral Daily   aspirin EC  81 mg Oral Daily   atorvastatin  40 mg Oral Daily   enoxaparin (LOVENOX) injection  30 mg Subcutaneous Q24H   ezetimibe  10 mg Oral Daily   feeding supplement  237 mL Oral BID BM   hydrALAZINE  100 mg Oral Q8H   insulin aspart  0-15 Units Subcutaneous TID WC   insulin aspart  0-5 Units Subcutaneous QHS   insulin glargine-yfgn  40 Units Subcutaneous Daily   levETIRAcetam  500 mg Oral BID   multivitamin with minerals  1 tablet Oral Daily   QUEtiapine  25 mg Oral QHS   sertraline  25 mg Oral Daily   Continuous Infusions:  sodium chloride 100 mL/hr at 11/30/21 2144   PRN Meds:.acetaminophen **OR** acetaminophen (TYLENOL) oral liquid 160 mg/5 mL **OR** acetaminophen, hydrALAZINE, LORazepam, ondansetron (ZOFRAN) IV, oxyCODONE, senna-docusate  Diet Orders (From admission, onward)     Start     Ordered  11/30/21 1454  Diet regular Room service appropriate? Yes; Fluid consistency: Thin  Diet effective now       Question Answer Comment  Room service appropriate? Yes   Fluid consistency: Thin      11/30/21 1453            DVT prophylaxis: enoxaparin (LOVENOX) injection 30 mg Start: 12/01/21 1200     Code Status: Full  Code  Family Communication: no family at bedside   Status is: Inpatient  Remains inpatient appropriate because: nausea/vomiting, AKI  Level of care: Telemetry Medical  Consultants:  Neurology   Procedures:  none  Microbiology  none  Antimicrobials: none    Objective: Vitals:   12/01/21 0004 12/01/21 0343 12/01/21 0539 12/01/21 0933  BP: (!) 151/85 (!) 119/52 (!) 148/69 137/63  Pulse: 88 77 73 82  Resp: 18 18  18   Temp: 98.8 F (37.1 C) 98.8 F (37.1 C)  98.4 F (36.9 C)  TempSrc: Oral Oral  Oral  SpO2: 99% 99%  97%  Weight:      Height:        Intake/Output Summary (Last 24 hours) at 12/01/2021 1103 Last data filed at 12/01/2021 0300 Gross per 24 hour  Intake --  Output 275 ml  Net -275 ml   Filed Weights   11/29/21 0000  Weight: 115.8 kg    Examination:  Constitutional: NAD Eyes: no scleral icterus ENMT: Mucous membranes are moist.  Neck: normal, supple Respiratory: clear to auscultation bilaterally, no wheezing, no crackles. Normal respiratory effort.  Cardiovascular: Regular rate and rhythm, no murmurs / rubs / gallops. No LE edema.  Abdomen: non distended, no tenderness. Bowel sounds positive.  Musculoskeletal: no clubbing / cyanosis.  Skin: no rashes Neurologic: no focal deficits   Data Reviewed: I have independently reviewed following labs and imaging studies   CBC: Recent Labs  Lab 11/29/21 0228 11/29/21 0229 11/30/21 0836  WBC 5.1  --  7.1  NEUTROABS 3.9  --  4.2  HGB 16.1 6.1* 11.8*  HCT 44.4 18.0* 33.5*  MCV 82.5  --  84.4  PLT 145*  --  102   Basic Metabolic Panel: Recent Labs  Lab 11/29/21 0228 11/29/21 0229 11/29/21 0320 11/30/21 0836 12/01/21 0842  NA 139 141  --  140 141  K 2.8* 2.8*  --  3.0* 3.7  CL 105 102  --  108 113*  CO2 23  --   --  25 20*  GLUCOSE 228* 230*  --  88 69*  BUN 21* 23*  --  21* 25*  CREATININE 3.38* 3.40*  --  3.98* 3.81*  CALCIUM 8.5*  --   --  8.6* 8.6*  MG  --   --  1.8 1.8  --     Liver Function Tests: Recent Labs  Lab 11/29/21 0228 11/30/21 0836  AST 18 18  ALT 15 15  ALKPHOS 136* 122  BILITOT 1.1 1.0  PROT 6.5 6.5  ALBUMIN 3.1* 2.9*   Coagulation Profile: Recent Labs  Lab 11/29/21 0228  INR 1.2   HbA1C: No results for input(s): HGBA1C in the last 72 hours. CBG: Recent Labs  Lab 11/30/21 1145 11/30/21 1849 11/30/21 2139 12/01/21 0618 12/01/21 0647  GLUCAP 192* 217* 208* 56* 110*    Recent Results (from the past 240 hour(s))  Resp Panel by RT-PCR (Flu A&B, Covid) Nasopharyngeal Swab     Status: None   Collection Time: 11/29/21  5:34 PM   Specimen: Nasopharyngeal Swab; Nasopharyngeal(NP)  swabs in vial transport medium  Result Value Ref Range Status   SARS Coronavirus 2 by RT PCR NEGATIVE NEGATIVE Final    Comment: (NOTE) SARS-CoV-2 target nucleic acids are NOT DETECTED.  The SARS-CoV-2 RNA is generally detectable in upper respiratory specimens during the acute phase of infection. The lowest concentration of SARS-CoV-2 viral copies this assay can detect is 138 copies/mL. A negative result does not preclude SARS-Cov-2 infection and should not be used as the sole basis for treatment or other patient management decisions. A negative result may occur with  improper specimen collection/handling, submission of specimen other than nasopharyngeal swab, presence of viral mutation(s) within the areas targeted by this assay, and inadequate number of viral copies(<138 copies/mL). A negative result must be combined with clinical observations, patient history, and epidemiological information. The expected result is Negative.  Fact Sheet for Patients:  EntrepreneurPulse.com.au  Fact Sheet for Healthcare Providers:  IncredibleEmployment.be  This test is no t yet approved or cleared by the [place] FDA and  has been authorized for detection and/or diagnosis of SARS-CoV-2 by FDA under an Emergency Use  Authorization (EUA). This EUA will remain  in effect (meaning this test can be used) for the duration of the COVID-19 declaration under Section 564(b)(1) of the Act, 21 U.S.C.section 360bbb-3(b)(1), unless the authorization is terminated  or revoked sooner.       Influenza A by PCR NEGATIVE NEGATIVE Final   Influenza B by PCR NEGATIVE NEGATIVE Final    Comment: (NOTE) The Xpert Xpress SARS-CoV-2/FLU/RSV plus assay is intended as an aid in the diagnosis of influenza from Nasopharyngeal swab specimens and should not be used as a sole basis for treatment. Nasal washings and aspirates are unacceptable for Xpert Xpress SARS-CoV-2/FLU/RSV testing.  Fact Sheet for Patients: EntrepreneurPulse.com.au  Fact Sheet for Healthcare Providers: IncredibleEmployment.be  This test is not yet approved or cleared by the [place] FDA and has been authorized for detection and/or diagnosis of SARS-CoV-2 by FDA under an Emergency Use Authorization (EUA). This EUA will remain in effect (meaning this test can be used) for the duration of the COVID-19 declaration under Section 564(b)(1) of the Act, 21 U.S.C. section 360bbb-3(b)(1), unless the authorization is terminated or revoked.  Performed at [facility], [place] [address], [place], [place] [address]      Radiology Studies: US RENAL  Result Date: 11/30/2021 CLINICAL DATA:  Acute kidney injury. Status post ablation of renal cell carcinoma. EXAM: RENAL / URINARY TRACT ULTRASOUND COMPLETE COMPARISON:  CT AP 04/05/2020 FINDINGS: Right Kidney: Renal measurements: 9.2 x 5.0 x 4.2 cm = volume: 101.2 ml. Calcified lesion within upper pole of right kidney measures 1.9 x 1.1 x 1.4 cm corresponding to prior cryoablation site. Echogenicity within normal limits. No hydronephrosis visualized. Left Kidney: Renal measurements: 11.0 x 7.1 x 5.8 cm = volume: 236.6 mL. Calcification within the inferior pole of the left kidney  measures 2.4 x 0.8 x 1.9 cm and corresponds to previous cryoablation site. Echogenicity within normal limits. No mass or hydronephrosis visualized. Bladder: Decompressed urinary bladder is suboptimally evaluated. The left ureteral jet is not confidently visualized. Other: None. IMPRESSION: 1. No acute abnormality identified.  No hydronephrosis identified. 2. Bilateral calcified lesions are identified within the upper pole of the right kidney and lower pole of left kidney corresponding to previous cryoablation sites. Electronically Signed   By: Kerby Moors M.D.   On: 11/30/2021 11:49     Marzetta Board, MD, PhD [facility]  Between 7  am - 7 pm I am available, please contact me via Amion (for emergencies) or Securechat (non urgent messages)  Between 7 pm - 7 am I am not available, please contact night coverage MD/APP via Amion

## 2021-12-01 NOTE — Progress Notes (Signed)
Inpatient Diabetes Program Recommendations  AACE/ADA: New Consensus Statement on Inpatient Glycemic Control (2015)  Target Ranges:  Prepandial:   less than 140 mg/dL      Peak postprandial:   less than 180 mg/dL (1-2 hours)      Critically ill patients:  140 - 180 mg/dL   Lab Results  Component Value Date   GLUCAP 87 12/01/2021   HGBA1C 8.4 (H) 09/14/2021    Review of Glycemic Control  Latest Reference Range & Units 11/30/21 06:17 11/30/21 11:45 11/30/21 18:49 11/30/21 21:39 12/01/21 06:18 12/01/21 06:47 12/01/21 11:06  Glucose-Capillary 70 - 99 mg/dL 70 192 (H) 217 (H) 208 (H) 56 (L) 110 (H) 87  (H): Data is abnormally high (L): Data is abnormally low Diabetes history: DM Outpatient Diabetes medications: Lantus 40 units qd, Novolog 2-10 units tid meal coverage Current orders for Inpatient glycemic control: Semglee 40 units qd, Novolog 0-15 units correction tid , 0-5 units hs  Inpatient Diabetes Program Recommendations:   Fasting CBG 56 this am. Please consider: -Decrease Semglee to 30 units qd -Decrease Novolog correction to 0-9 units tid + hs 0-5 units -Add Novolog 2-3 units tid meal coverage if needed for meals if eats 50%  Thank you, Bethena Roys E. Cinch Ormond, RN, MSN, CDE  Diabetes Coordinator Inpatient Glycemic Control Team Team Pager [phone] (8am-5pm) 12/01/2021 12:23 PM

## 2021-12-02 LAB — CBC
HCT: 31.6 % — ABNORMAL LOW (ref 39.0–52.0)
Hemoglobin: 10.8 g/dL — ABNORMAL LOW (ref 13.0–17.0)
MCH: 29.8 pg (ref 26.0–34.0)
MCHC: 34.2 g/dL (ref 30.0–36.0)
MCV: 87.3 fL (ref 80.0–100.0)
Platelets: 230 10*3/uL (ref 150–400)
RBC: 3.62 MIL/uL — ABNORMAL LOW (ref 4.22–5.81)
RDW: 12.7 % (ref 11.5–15.5)
WBC: 6.6 10*3/uL (ref 4.0–10.5)
nRBC: 0 % (ref 0.0–0.2)

## 2021-12-02 LAB — GLUCOSE, CAPILLARY
Glucose-Capillary: 65 mg/dL — ABNORMAL LOW (ref 70–99)
Glucose-Capillary: 59 mg/dL — ABNORMAL LOW (ref 70–99)
Glucose-Capillary: 110 mg/dL — ABNORMAL HIGH (ref 70–99)
Glucose-Capillary: 151 mg/dL — ABNORMAL HIGH (ref 70–99)
Glucose-Capillary: 80 mg/dL (ref 70–99)
Glucose-Capillary: 201 mg/dL — ABNORMAL HIGH (ref 70–99)

## 2021-12-02 LAB — BASIC METABOLIC PANEL
Anion gap: 4 — ABNORMAL LOW (ref 5–15)
BUN: 25 mg/dL — ABNORMAL HIGH (ref 6–20)
CO2: 21 mmol/L — ABNORMAL LOW (ref 22–32)
Calcium: 8.3 mg/dL — ABNORMAL LOW (ref 8.9–10.3)
Chloride: 116 mmol/L — ABNORMAL HIGH (ref 98–111)
Creatinine, Ser: 4.17 mg/dL — ABNORMAL HIGH (ref 0.61–1.24)
GFR, Estimated: 16 mL/min — ABNORMAL LOW (ref >60)
Glucose, Bld: 61 mg/dL — ABNORMAL LOW (ref 70–99)
Potassium: 3.6 mmol/L (ref 3.5–5.1)
Sodium: 141 mmol/L (ref 135–145)

## 2021-12-02 LAB — URINALYSIS, ROUTINE W REFLEX MICROSCOPIC
Bacteria, UA: NONE SEEN
Bilirubin Urine: NEGATIVE
Glucose, UA: 50 mg/dL — AB
Hgb urine dipstick: NEGATIVE
Ketones, ur: NEGATIVE mg/dL
Leukocytes,Ua: NEGATIVE
Nitrite: NEGATIVE
Protein, ur: >=300 mg/dL — AB
Specific Gravity, Urine: 1.022 (ref 1.005–1.030)
pH: 5 (ref 5.0–8.0)

## 2021-12-02 LAB — RAPID URINE DRUG SCREEN, HOSP PERFORMED
Amphetamines: NOT DETECTED
Barbiturates: NOT DETECTED
Benzodiazepines: NOT DETECTED
Cocaine: NOT DETECTED
Opiates: NOT DETECTED
Tetrahydrocannabinol: NOT DETECTED

## 2021-12-02 LAB — PROTEIN / CREATININE RATIO, URINE
Creatinine, Urine: 375.15 mg/dL
Protein Creatinine Ratio: 3 mg/mg{Cre} — ABNORMAL HIGH (ref 0.00–0.15)
Total Protein, Urine: 1125 mg/dL

## 2021-12-02 MED ORDER — HYDRALAZINE HCL 50 MG PO TABS
50.0000 mg | ORAL_TABLET | Freq: Three times a day (TID) | ORAL | Status: DC
  Administered 2021-12-02 (×2): 50 mg via ORAL
  Filled 2021-12-02 (×2): qty 1

## 2021-12-02 MED ORDER — INSULIN GLARGINE-YFGN 100 UNIT/ML ~~LOC~~ SOLN
20.0000 [IU] | Freq: Every day | SUBCUTANEOUS | Status: DC
Start: 2021-12-02 — End: 2021-12-03
  Administered 2021-12-02: 10:00:00 20 [IU] via SUBCUTANEOUS
  Filled 2021-12-02 (×2): qty 0.2

## 2021-12-02 NOTE — Consult Note (Signed)
Nephrology Consult   Requesting provider: Marzetta Board Service requesting consult: hospitalist Reason for consult: AKI on CKD IV   Assessment/Recommendations: Brandon [name] is a/an 60 y.o. male with a past medical history CVA, seizures, RCC s/p cryoablation, DM2, HTN, schizophrenia who presents with AMS  Non-Oliguric AKI on CKD IV: Difficult to call definitive baseline given what appears to be rapid progression of CKD but likely currently around 3.5.  Slightly worse at this time with creatinine of 4.2 possibly related to hemodynamic changes from controlling blood pressure.  Baseline CKD likely related to diabetic kidney disease, arterionephrosclerosis, renal mass loss from cryoablation, secondary FSGS. -Currently on IV fluids, appears slightly volume overloaded with lower extremity and upper extremity edema.  Stop fluids for now -Continue to monitor daily Cr, Dose meds for GFR -Monitor Daily I/Os, Daily weight  -Maintain MAP>65 for optimal renal perfusion.  -Avoid nephrotoxic medications including NSAIDs -Currently no indication for HD -Discussed that the patient will likely have progressive chronic kidney disease and may require dialysis in the next 6 to 12 months based on previous progression.  Patient seems to be in a phase of denial.  I discussed setting him up for an appointment in our office but he is hesitant at this time.  We will discuss again tomorrow.  Volume Status: Appears slightly volume overloaded on exam.  Stop IV fluids  Hypertension: Improved control.  May want him to run slightly higher given he was noncompliant with medications at home.  Decrease hydralazine to 50 mg 3 times daily  Anemia: Mild anemia with hemoglobin of 10.8.  CKD may be contributing.  Continue to monitor for now  Uncontrolled Diabetes Mellitus Type 2 with Hyperglycemia: Also with some hypoglycemia.  Management per primary team  Altered mental status: Unclear cause.  Management per primary  team   Recommendations conveyed to primary service.    [facility] 12/02/2021 1:06 PM   _____________________________________________________________________________________ CC: Altered mental status  History of Present Illness: Brandon [name] is a/an 60 y.o. male with a past medical history of CVA, seizures, RCC s/p cryoablation, DM2, HTN, schizophrenia who presents with AMS.  Patient was admitted on 12/26 due to altered mental status.  His significant other brought him to the hospital because he was acting strange at home and had not been taking his typical medications.  He also was complaining of headache.  He was not answering questions appropriately.  In the emergency department he was acting similarly not oriented to what was going on.  After admission the patient's symptoms did improve and altered mental status nearly resolved.  He has a history of progressive chronic kidney disease with baseline of 2 back in August 2022 but during multiple admissions over the past 6 months his creatinine has worsened steadily and also fluctuated.  Some AKI in the past.  He also has had some renal mass loss related to cryoablation x2 for RCC.  He has had extensive proteinuria in the past thought to be related to poorly controlled diabetes.  The patient states he feels well today.  Denies any issues urinating.  No shortness of breath or chest pain.  No longer seems confused.  States that he may have heard about his kidneys being dysfunctional in the past but has not wanted to see a nephrologist because he is seeing a lot of other doctors.  He is frustrated by his current medical care in the outpatient setting stating that he has frequent adjustments of his medications  and feels like they did not work.  He is hesitant to go see a nephrologist outpatient because he does not think it will do him any good.  Renal ultrasound on 11/30/2021 with signs of chronic kidney disease  as well as damage related to previous cryoablation.  No hydronephrosis.   Medications:  Current Facility-Administered Medications  Medication Dose Route Frequency Provider Last Rate Last Admin   acetaminophen (TYLENOL) tablet 650 mg  650 mg Oral Q4H PRN Karmen Bongo, MD       Or   acetaminophen (TYLENOL) 160 MG/5ML solution 650 mg  650 mg Per Tube Q4H PRN Karmen Bongo, MD       Or   acetaminophen (TYLENOL) suppository 650 mg  650 mg Rectal Q4H PRN Karmen Bongo, MD       amLODipine (NORVASC) tablet 10 mg  10 mg Oral Daily Starla Link, Kshitiz, MD   10 mg at 12/02/21 1008   aspirin EC tablet 81 mg  81 mg Oral Daily Karmen Bongo, MD   81 mg at 12/01/21 0920   atorvastatin (LIPITOR) tablet 40 mg  40 mg Oral Daily Karmen Bongo, MD   40 mg at 12/02/21 1008   enoxaparin (LOVENOX) injection 30 mg  30 mg Subcutaneous Q24H Nicholad, Kautzman, RPH   30 mg at 12/02/21 1230   ezetimibe (ZETIA) tablet 10 mg  10 mg Oral Daily Karmen Bongo, MD   10 mg at 12/02/21 1008   feeding supplement (ENSURE ENLIVE / ENSURE PLUS) liquid 237 mL  237 mL Oral BID BM Alekh, Kshitiz, MD   237 mL at 12/02/21 1230   hydrALAZINE (APRESOLINE) injection 5 mg  5 mg Intravenous Q4H PRN Karmen Bongo, MD       hydrALAZINE (APRESOLINE) tablet 50 mg  50 mg Oral Q8H Reesa Chew, MD   50 mg at 12/02/21 1230   insulin aspart (novoLOG) injection 0-15 Units  0-15 Units Subcutaneous TID WC Karmen Bongo, MD   3 Units at 12/02/21 1229   insulin aspart (novoLOG) injection 0-5 Units  0-5 Units Subcutaneous QHS Karmen Bongo, MD   2 Units at 11/30/21 2151   insulin glargine-yfgn (SEMGLEE) injection 20 Units  20 Units Subcutaneous Daily Caren Griffins, MD   20 Units at 12/02/21 1008   levETIRAcetam (KEPPRA) tablet 500 mg  500 mg Oral BID Karmen Bongo, MD   500 mg at 12/02/21 1008   LORazepam (ATIVAN) injection 4 mg  4 mg Intravenous Q5 Min x 2 PRN Karmen Bongo, MD       multivitamin with minerals tablet 1  tablet  1 tablet Oral Daily Starla Link, Kshitiz, MD   1 tablet at 12/02/21 1008   ondansetron (ZOFRAN) injection 4 mg  4 mg Intravenous Q6H PRN Caren Griffins, MD   4 mg at 12/01/21 7371   oxyCODONE (Oxy IR/ROXICODONE) immediate release tablet 5 mg  5 mg Oral Q6H PRN Caren Griffins, MD   5 mg at 12/01/21 2105   QUEtiapine (SEROQUEL) tablet 25 mg  25 mg Oral Ivery Quale, MD   25 mg at 12/01/21 2105   senna-docusate (Senokot-S) tablet 1 tablet  1 tablet Oral QHS PRN Karmen Bongo, MD       sertraline (ZOLOFT) tablet 25 mg  25 mg Oral Daily Karmen Bongo, MD   25 mg at 12/02/21 1008     ALLERGIES Morphine  MEDICAL HISTORY Past Medical History:  Diagnosis Date   Acute ischemic stroke [facility]) 11/2013   Allergy  Anxiety    Arthritis    Benign hypertension with CKD (chronic kidney disease) stage III (HCC)    Bil Renal Ca dx'd 09/2011 & 11/2011   left and right; cryoablation bil   Depression    BH Adm in [place] Depression   Diabetes mellitus    DKA prior hospitalization   Diverticulitis    s/p micorperforation Sept 2012-managed conservatively by Gen surgery   ED (erectile dysfunction)    Focal seizure ([place]) 11/2013   due to ischemic stroke   GERD (gastroesophageal reflux disease)    Hiatal hernia    Hyperlipidemia    Hypertension    Seizures ([place])    none since 2016, taking Keppra - maw   Sleep apnea    Thyroid disease    "weak thyroid" per MD   Wears glasses      SOCIAL HISTORY Social History   Socioeconomic History   Marital status: Single    Spouse name: Not on file   Number of children: 2   Years of education: college   Highest education level: Not on file  Occupational History   Occupation: disabled  Tobacco Use   Smoking status: Never   Smokeless tobacco: Never  Vaping Use   Vaping Use: Never used  Substance and Sexual Activity   Alcohol use: Not Currently    Comment: rarely   Drug use: No   Sexual activity: Never  Other Topics Concern    Not on file  Social History Narrative   Patient lives alone.   Education. Two years of college.   Not working.   Right handed.   Caffeine one soda daily. Drinks coffee    Social Determinants of Radio broadcast assistant Strain: Low Risk    Difficulty of Paying Living Expenses: Not very hard  Food Insecurity: Not on file  Transportation Needs: Not on file  Physical Activity: Not on file  Stress: Not on file  Social Connections: Not on file  Intimate Partner Violence: Not on file     FAMILY HISTORY Family History  Problem Relation Age of Onset   Diabetes Father    Heart disease Father    Pneumonia Mother    Prostate cancer Other    Stomach cancer Other    Breast cancer Sister    Colon cancer Neg Hx    Esophageal cancer Neg Hx    Rectal cancer Neg Hx       Review of Systems: 12 systems reviewed Otherwise as per HPI, all other systems reviewed and negative  Physical Exam: Vitals:   12/02/21 0755 12/02/21 1222  BP: 123/62 138/83  Pulse: 92 85  Resp: 20 20  Temp: 98.6 F (37 C) 98.2 F (36.8 C)  SpO2: 98% 100%   Total I/O In: 120 [P.O.:120] Out: -   Intake/Output Summary (Last 24 hours) at 12/02/2021 1306 Last data filed at 12/02/2021 0900 Gross per 24 hour  Intake 1441.86 ml  Output 600 ml  Net 841.86 ml   General: well-appearing, no acute distress HEENT: anicteric sclera, oropharynx clear without lesions CV: regular rate, normal rhythm, 1+ pitting edema in the bilateral shins, nonpitting edema in the bilateral hands Lungs: clear to auscultation bilaterally, normal work of breathing Abd: soft, non-tender, non-distended Skin: no visible lesions or rashes Psych: alert, engaged, appropriate mood and affect Musculoskeletal: no obvious deformities Neuro: normal speech, no gross focal deficits   Test Results Reviewed Lab Results  Component Value Date   NA 141 12/02/2021  K 3.6 12/02/2021   CL 116 (H) 12/02/2021   CO2 21 (L) 12/02/2021   BUN 25  (H) 12/02/2021   CREATININE 4.17 (H) 12/02/2021   CALCIUM 8.3 (L) 12/02/2021   ALBUMIN 2.9 (L) 11/30/2021   PHOS 4.0 07/05/2021     I have reviewed all relevant outside healthcare records related to the patient's current hospitalization

## 2021-12-02 NOTE — Progress Notes (Signed)
PROGRESS NOTE  Brandon [name] [name] DOB: [birth year] DOA: 11/29/2021 PCP: Carlena Hurl, Brandon   LOS: 2 days   Brief Narrative / Interim history: 60 year old male with history of prior CVA, seizures, chronic kidney disease stage IV, RCC, DM2, HTN, HLD, hypothyroidism comes to the hospital with confusion.  He was quite hypertensive on admission with blood pressure in the 188C systolic.  [name] with a creatinine of 3.4 from baseline of 2.7-2.9.  Due to confusion neurology was consulted and he was admitted to the hospital.  Subjective / 24h Interval events: Doing better.  No headaches, no nausea or vomiting this morning  Assessment & Plan: Principal Problem Acute metabolic encephalopathy, weakness-patient with known history of prior CVA and seizures, came to the hospital with confusion.  He underwent a CT scan of the head as well as MRI which did not show any acute intracranial abnormalities.  EEG was negative for seizures.  Neurology consulted, possibly has been altered mental status from hypertensive emergency/metabolic derangements with AKI.  Currently his mental status appears to be improving.  [name] and recommended SNF placement  Active Problems Acute kidney injury, on chronic kidney disease stage IV-Baseline creatinine 2.7-2.9, although creatinine a month ago was 2.3.  Creatinine plateaued around 3.8 over the last 2 days but today has increased above 4.  I have consulted nephrology, appreciate input. Renal ultrasound was without hydronephrosis or acute abnormalities.    Left flank pain-acute on chronic, thinks it is due to "nerve pain from his kidneys" after having procedure done in the past.  Improved with oxycodone  Nausea, vomiting-supportive management, possible due to AKI/hypertensive emergency.  Based on prior notes this is a chronic component to his  History of bilateral renal cell carcinomas-status post  percutaneous ablation in the past, 2012  Seizure disorder -EEG as above.  Continue Keppra.   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  4.  Continue long-acting insulin along with CBGs with SSI  CBG (last 3)  Recent Labs    12/02/21 0611 12/02/21 0653 12/02/21 0743  GLUCAP 65* 59* 110*    Hypokalemia -Replace.  Repeat a.m. labs  Hyperlipidemia -continue statin and ezetimibe  Hypertension/hypertensive emergency -Patient presented with extremely elevated blood pressure and was encephalopathy.  Blood pressure improved, currently on amlodipine 10 mg, hydralazine 100 mg every 8   History of unspecified CVA with residual deficit -Continue aspirin and statin.  Anxiety and depression-continue sertraline and quetiapine  Scheduled Meds:  amLODipine  10 mg Oral Daily   aspirin EC  81 mg Oral Daily   atorvastatin  40 mg Oral Daily   enoxaparin (LOVENOX) injection  30 mg Subcutaneous Q24H   ezetimibe  10 mg Oral Daily   feeding supplement  237 mL Oral BID BM   hydrALAZINE  100 mg Oral Q8H   insulin aspart  0-15 Units Subcutaneous TID WC   insulin aspart  0-5 Units Subcutaneous QHS   insulin glargine-yfgn  20 Units Subcutaneous Daily   levETIRAcetam  500 mg Oral BID   multivitamin with minerals  1 tablet Oral Daily   QUEtiapine  25 mg Oral QHS   sertraline  25 mg Oral Daily   Continuous Infusions:  sodium chloride 100 mL/hr at 12/02/21 1008   PRN Meds:.acetaminophen **OR** acetaminophen (TYLENOL) oral liquid 160 mg/5 mL **OR** acetaminophen, hydrALAZINE, LORazepam, ondansetron (ZOFRAN) IV, oxyCODONE, senna-docusate  Diet Orders (From admission, onward)  Start     Ordered   11/30/21 1454  Diet regular Room service appropriate? Yes; Fluid consistency: Thin  Diet effective now       Question Answer Comment  Room service appropriate? Yes   Fluid consistency: Thin      11/30/21 1453            DVT prophylaxis: enoxaparin (LOVENOX) injection 30  mg Start: 12/01/21 1200     Code Status: Full Code  Family Communication: no family at bedside   Status is: Inpatient  Remains inpatient appropriate because: AKI  Level of care: Telemetry Medical  Consultants:  Neurology   Procedures:  none  Microbiology  none  Antimicrobials: none    Objective: Vitals:   12/01/21 2020 12/01/21 2357 12/02/21 0400 12/02/21 0755  BP: 140/66 (!) 104/46 (!) 126/56 123/62  Pulse: 87 82 83 92  Resp: 17 17 17 20   Temp: 98.7 F (37.1 C) 98.5 F (36.9 C) 98.3 F (36.8 C) 98.6 F (37 C)  TempSrc: Oral Oral Oral Oral  SpO2: 98% 97% 97% 98%  Weight:      Height:        Intake/Output Summary (Last 24 hours) at 12/02/2021 1040 Last data filed at 12/02/2021 0503 Gross per 24 hour  Intake 1641.86 ml  Output 600 ml  Net 1041.86 ml    Filed Weights   11/29/21 0000  Weight: 115.8 kg    Examination:  Constitutional: No distress Eyes: Anicteric ENMT: Moist mucous membranes Neck: normal, supple Respiratory: Lungs are clear bilaterally, no wheezing or crackles heard Cardiovascular: Regular rate and rhythm, no murmurs, no edema Abdomen: Soft, NT, ND, bowel sounds positive Musculoskeletal: no clubbing / cyanosis.  Skin: No rashes seen Neurologic: Nonfocal   Data Reviewed: I have independently reviewed following labs and imaging studies   CBC: Recent Labs  Lab 11/29/21 0228 11/29/21 0229 11/30/21 0836 12/02/21 0318  WBC 5.1  --  7.1 6.6  NEUTROABS 3.9  --  4.2  --   HGB 16.1 6.1* 11.8* 10.8*  HCT 44.4 18.0* 33.5* 31.6*  MCV 82.5  --  84.4 87.3  PLT 145*  --  242 161    Basic Metabolic Panel: Recent Labs  Lab 11/29/21 0228 11/29/21 0229 11/29/21 0320 11/30/21 0836 12/01/21 0842 12/02/21 0318  NA 139 141  --  140 141 141  K 2.8* 2.8*  --  3.0* 3.7 3.6  CL 105 102  --  108 113* 116*  CO2 23  --   --  25 20* 21*  GLUCOSE 228* 230*  --  88 69* 61*  BUN 21* 23*  --  21* 25* 25*  CREATININE 3.38* 3.40*  --  3.98*  3.81* 4.17*  CALCIUM 8.5*  --   --  8.6* 8.6* 8.3*  MG  --   --  1.8 1.8  --   --     Liver Function Tests: Recent Labs  Lab 11/29/21 0228 11/30/21 0836  AST 18 18  ALT 15 15  ALKPHOS 136* 122  BILITOT 1.1 1.0  PROT 6.5 6.5  ALBUMIN 3.1* 2.9*    Coagulation Profile: Recent Labs  Lab 11/29/21 0228  INR 1.2    HbA1C: No results for input(s): HGBA1C in the last 72 hours. CBG: Recent Labs  Lab 12/01/21 1559 12/01/21 2123 12/02/21 0611 12/02/21 0653 12/02/21 0743  GLUCAP 85 90 65* 59* 110*     Recent Results (from the past 240 hour(s))  Resp Panel by RT-PCR (Flu  A&B, Covid) Nasopharyngeal Swab     Status: None   Collection Time: 11/29/21  5:34 PM   Specimen: Nasopharyngeal Swab; Nasopharyngeal(NP) swabs in vial transport medium  Result Value Ref Range Status   SARS Coronavirus 2 by RT PCR NEGATIVE NEGATIVE Final    Comment: (NOTE) SARS-CoV-2 target nucleic acids are NOT DETECTED.  The SARS-CoV-2 RNA is generally detectable in upper respiratory specimens during the acute phase of infection. The lowest concentration of SARS-CoV-2 viral copies this assay can detect is 138 copies/mL. A negative result does not preclude SARS-Cov-2 infection and should not be used as the sole basis for treatment or other patient management decisions. A negative result may occur with  improper specimen collection/handling, submission of specimen other than nasopharyngeal swab, presence of viral mutation(s) within the areas targeted by this assay, and inadequate number of viral copies(<138 copies/mL). A negative result must be combined with clinical observations, patient history, and epidemiological information. The expected result is Negative.  Fact Sheet for Patients:  EntrepreneurPulse.com.au  Fact Sheet for Healthcare Providers:  IncredibleEmployment.be  This test is no t yet approved or cleared by the [place] FDA and  has been  authorized for detection and/or diagnosis of SARS-CoV-2 by FDA under an Emergency Use Authorization (EUA). This EUA will remain  in effect (meaning this test can be used) for the duration of the COVID-19 declaration under Section 564(b)(1) of the Act, 21 U.S.C.section 360bbb-3(b)(1), unless the authorization is terminated  or revoked sooner.       Influenza A by PCR NEGATIVE NEGATIVE Final   Influenza B by PCR NEGATIVE NEGATIVE Final    Comment: (NOTE) The Xpert Xpress SARS-CoV-2/FLU/RSV plus assay is intended as an aid in the diagnosis of influenza from Nasopharyngeal swab specimens and should not be used as a sole basis for treatment. Nasal washings and aspirates are unacceptable for Xpert Xpress SARS-CoV-2/FLU/RSV testing.  Fact Sheet for Patients: EntrepreneurPulse.com.au  Fact Sheet for Healthcare Providers: IncredibleEmployment.be  This test is not yet approved or cleared by the [place] FDA and has been authorized for detection and/or diagnosis of SARS-CoV-2 by FDA under an Emergency Use Authorization (EUA). This EUA will remain in effect (meaning this test can be used) for the duration of the COVID-19 declaration under Section 564(b)(1) of the Act, 21 U.S.C. section 360bbb-3(b)(1), unless the authorization is terminated or revoked.  Performed at [facility], [place] [address] [place], [place] [address]       Radiology Studies: No results found.   Marzetta Board, MD, PhD [facility]  Between 7 am - 7 pm I am available, please contact me via Amion (for emergencies) or Securechat (non urgent messages)  Between 7 pm - 7 am I am not available, please contact night coverage MD/APP via Amion

## 2021-12-02 NOTE — Progress Notes (Signed)
Physical Therapy Treatment Patient Details Name: Brandon [name] MRN: [id number] DOB: 1961/11/28 Today's Date: 12/02/2021   History of Present Illness Pt is a 60 y/o male admitted secondary to AMS. Found to be hypertensive and hyperglycemic. PMH includes DM, HTN, CVA with L deficits, schizophrenia, seizure, and CKD.    PT Comments    Pt received in supine, initially pleasantly cooperative but once seated EOB, pt agitated due to increased LLE pain, RN notified pt requesting medication. Pt able to perform transfers with Supervision and gait with no AD and min guard (pt apprehensive and requesting no staff assist) for short distances but not receptive to longer gait progression or further balance assessment due to severe reported LLE pain. Pt mildly unsteady without AD but no overt LOB, reaching out for furniture at times. Pt continues to benefit from PT services to progress toward functional mobility goals. Continue to recommend SNF.  Recommendations for follow up therapy are one component of a multi-disciplinary discharge planning process, led by the attending physician.  Recommendations may be updated based on patient status, additional functional criteria and insurance authorization.  Follow Up Recommendations  Skilled nursing-short term rehab (<3 hours/day)     Assistance Recommended at Discharge Frequent or constant Supervision/Assistance  Equipment Recommendations  None recommended by PT    Recommendations for Other Services       Precautions / Restrictions Precautions Precautions: Fall Restrictions Weight Bearing Restrictions: No     Mobility  Bed Mobility Overal bed mobility: Needs Assistance Bed Mobility: Supine to Sit     Supine to sit: Modified independent (Device/Increase time)     General bed mobility comments: +increased time no physical assist needed    Transfers Overall transfer level: Needs assistance Equipment used: None Transfers: Sit to/from  Stand Sit to Stand: Supervision           General transfer comment: from EOB, pt refusing RW    Ambulation/Gait Ambulation/Gait assistance: Min guard Gait Distance (Feet): 30 Feet Assistive device: None Gait Pattern/deviations: Step-through pattern;Drifts right/left;Wide base of support       General Gait Details: Pt impulsive to stand up and walk around room, denies need to use bathroom. Pt returns back to EOB and refusing further gait progression until his pain medicine arrives.      Balance Overall balance assessment: Needs assistance Sitting-balance support: No upper extremity supported;Feet supported Sitting balance-Leahy Scale: Fair     Standing balance support: During functional activity;No upper extremity supported Standing balance-Leahy Scale: Fair                Cognition Arousal/Alertness: Awake/alert Behavior During Therapy: WFL for tasks assessed/performed;Agitated (intermittently agitated, mostly related to safety precautions) Overall Cognitive Status: History of cognitive impairments - at baseline             General Comments: pt follows all commands with increased time. He is easily frustrated and requires gentle encouragement to participate. oriented to self and place, and can generally speak to situation. pt more agitated due to safety precautions and need for gait belt, refusing RW use        Exercises      General Comments General comments (skin integrity, edema, etc.): Pt family member [name] (unclear as to relation) present in room and encouraging to pt. Pt denies dizziness but reports consistent pain throughout session, gait pattern not antalgic. Encouraged him to get up more frequently with call bell use prior to OOB for safety and staff supervision. Bed alarm on at end  of session for safety due to pt impulsivity.      Pertinent Vitals/Pain Pain Assessment: 0-10 Pain Score: 8  Pain Location: LLE Pain Descriptors / Indicators:  Discomfort;Radiating;Tingling Pain Intervention(s): Limited activity within patient's tolerance;Monitored during session;Repositioned;Patient requesting pain meds-RN notified     PT Goals (current goals can now be found in the care plan section) Acute Rehab PT Goals Patient Stated Goal: to get better PT Goal Formulation: With patient Time For Goal Achievement: 12/13/21 Progress towards PT goals: Progressing toward goals    Frequency    Min 2X/week      PT Plan Current plan remains appropriate       AM-PAC PT "6 Clicks" Mobility   Outcome Measure  Help needed turning from your back to your side while in a flat bed without using bedrails?: None Help needed moving from lying on your back to sitting on the side of a flat bed without using bedrails?: None Help needed moving to and from a bed to a chair (including a wheelchair)?: A Little Help needed standing up from a chair using your arms (e.g., wheelchair or bedside chair)?: A Little Help needed to walk in hospital room?: A Little Help needed climbing 3-5 steps with a railing? : A Lot 6 Click Score: 19    End of Session Equipment Utilized During Treatment: Gait belt Activity Tolerance: Patient limited by pain;Treatment limited secondary to agitation Patient left: in bed;with call bell/phone within reach;with bed alarm set;with family/visitor present (pt seated EOB to take pain meds and drink apple juice, tray table pulled in front of him. sig other(?) [name] present  at bedside.) Nurse Communication: Mobility status;Patient requests pain meds PT Visit Diagnosis: Unsteadiness on feet (R26.81);Muscle weakness (generalized) (M62.81);Difficulty in walking, not elsewhere classified (R26.2)     Time: 6433-2951 PT Time Calculation (min) (ACUTE ONLY): 19 min  Charges:  $Gait Training: 8-22 mins                     Pegge Cumberledge P., PTA Acute Rehabilitation Services Pager: [phone] Office: [place] 12/02/2021, 6:24 PM

## 2021-12-02 NOTE — Plan of Care (Signed)
Neurology no charge plan of care note:   Patient was seen in consult as a code stroke on arrival to the hospital 11/29/21. No stroke was found on imaging. He had encephalopathy thought to be related to severe hyperglycemia and malignant hypertension. He has had an episode in the past for the same issue.   Patient has become more to his baseline AMS as BP and sugar have decreased. Last BP 123/62. Glucose 110. HbA1c should be < 7 due to history of a stroke. This should be f/up by PCP.   Neurology will sign off now. Discussed with hospitalists.   Clance Boll, MSN, APN-BC Neurology Nurse Practitioner Pager [phone]

## 2021-12-02 NOTE — Progress Notes (Signed)
Hypoglycemic Event  CBG @ 0611 result: 65  Treatment: 4 oz juice/soda  Symptoms: None None Follow-up CBG: Time: 0653 CBG Result: 59  Gave 2nd juice of 8oz Recheck CBG @ 0740 Result: 110  Possible Reasons for Event: Medication regimen: Insulin and Unknown  Comments/MD notified:Passed off to dayshift RN Claiborne Billings and she is aware and will notify day shift MD.    Philomena Course

## 2021-12-03 LAB — CBC
HCT: 27.5 % — ABNORMAL LOW (ref 39.0–52.0)
Hemoglobin: 9.1 g/dL — ABNORMAL LOW (ref 13.0–17.0)
MCH: 29.4 pg (ref 26.0–34.0)
MCHC: 33.1 g/dL (ref 30.0–36.0)
MCV: 89 fL (ref 80.0–100.0)
Platelets: 226 10*3/uL (ref 150–400)
RBC: 3.09 MIL/uL — ABNORMAL LOW (ref 4.22–5.81)
RDW: 12.6 % (ref 11.5–15.5)
WBC: 6.2 10*3/uL (ref 4.0–10.5)
nRBC: 0 % (ref 0.0–0.2)

## 2021-12-03 LAB — IRON AND TIBC
Iron: 68 ug/dL (ref 45–182)
Saturation Ratios: 37 % (ref 17.9–39.5)
TIBC: 186 ug/dL — ABNORMAL LOW (ref 250–450)
UIBC: 118 ug/dL

## 2021-12-03 LAB — BASIC METABOLIC PANEL
Anion gap: 4 — ABNORMAL LOW (ref 5–15)
BUN: 29 mg/dL — ABNORMAL HIGH (ref 6–20)
CO2: 22 mmol/L (ref 22–32)
Calcium: 8.1 mg/dL — ABNORMAL LOW (ref 8.9–10.3)
Chloride: 115 mmol/L — ABNORMAL HIGH (ref 98–111)
Creatinine, Ser: 4.43 mg/dL — ABNORMAL HIGH (ref 0.61–1.24)
GFR, Estimated: 15 mL/min — ABNORMAL LOW (ref >60)
Glucose, Bld: 98 mg/dL (ref 70–99)
Potassium: 4 mmol/L (ref 3.5–5.1)
Sodium: 141 mmol/L (ref 135–145)

## 2021-12-03 LAB — GLUCOSE, CAPILLARY
Glucose-Capillary: 66 mg/dL — ABNORMAL LOW (ref 70–99)
Glucose-Capillary: 86 mg/dL (ref 70–99)
Glucose-Capillary: 102 mg/dL — ABNORMAL HIGH (ref 70–99)
Glucose-Capillary: 123 mg/dL — ABNORMAL HIGH (ref 70–99)
Glucose-Capillary: 243 mg/dL — ABNORMAL HIGH (ref 70–99)

## 2021-12-03 LAB — FERRITIN: Ferritin: 267 ng/mL (ref 24–336)

## 2021-12-03 MED ORDER — METOPROLOL SUCCINATE ER 25 MG PO TB24
25.0000 mg | ORAL_TABLET | Freq: Every day | ORAL | Status: DC
  Administered 2021-12-03 – 2021-12-04 (×2): 25 mg via ORAL
  Filled 2021-12-03 (×2): qty 1

## 2021-12-03 MED ORDER — HYDRALAZINE HCL 50 MG PO TABS: 50.0000 mg | ORAL_TABLET | Freq: Three times a day (TID) | ORAL | Status: DC

## 2021-12-03 MED ORDER — INSULIN GLARGINE-YFGN 100 UNIT/ML ~~LOC~~ SOLN
10.0000 [IU] | Freq: Every day | SUBCUTANEOUS | Status: DC
  Administered 2021-12-03 – 2021-12-04 (×2): 10 [IU] via SUBCUTANEOUS
  Filled 2021-12-03 (×2): qty 0.1

## 2021-12-03 NOTE — Progress Notes (Signed)
PROGRESS NOTE  Brandon [name] TGG:[id number] DOB: 01-13-1961 DOA: 11/29/2021 PCP: Carlena Hurl, PA-C   LOS: 3 days   Brief Narrative / Interim history: 60 year old male with history of prior CVA, seizures, chronic kidney disease stage IV, RCC, DM2, HTN, HLD, hypothyroidism comes to the hospital with confusion.  He was quite hypertensive on admission with blood pressure in the 703J systolic.  [name] with a creatinine of 3.4 from baseline of 2.7-2.9.  Due to confusion neurology was consulted and he was admitted to the hospital.  Subjective / 24h Interval events: No specific complaints.  Gets aggravated when I tell him that his kidney function is getting worse  Assessment & Plan: Principal Problem Acute metabolic encephalopathy, weakness-patient with known history of prior CVA and seizures, came to the hospital with confusion.  He underwent a CT scan of the head as well as MRI which did not show any acute intracranial abnormalities.  EEG was negative for seizures.  Neurology consulted, possibly has been altered mental status from hypertensive emergency/metabolic derangements with AKI.  Currently his mental status appears to be improving.  [name] and recommended SNF placement, however he is refusing and tells me that he wants to go home instead  Active Problems Acute kidney injury, on chronic kidney disease stage IV-Baseline creatinine 2.7-2.9, although creatinine a month ago was 2.3.  Creatinine plateaued around 3.8 over the last 2 days but over the past couple of days have continued to rise, currently at 4.4.  Nephrology consulted.  Patient was told that he may need dialysis in 6 to 12 months but he is refusing to accept and he is in denial.  Reiterates that to me again today and gets quite angry.  Per nephrology recommendations, continue to monitor until creatinine stabilizes  Left flank pain-acute on chronic, thinks it is due to "nerve  pain from his kidneys" after having procedure done in the past.  Improved with oxycodone  Nausea, vomiting-supportive management, possible due to AKI/hypertensive emergency.  Based on prior notes this is a chronic component to his  History of bilateral renal cell carcinomas-status post percutaneous ablation in the past, 2012  Seizure disorder -EEG as above.  Continue Keppra.   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  4.  Continue long-acting insulin along with CBGs with SSI.  Another hypoglycemic episode this morning likely in the setting of progressive renal failure.  Decrease insulin again today  CBG (last 3)  Recent Labs    12/02/21 2125 12/03/21 0629 12/03/21 0654  GLUCAP 201* 66* 86    Hypokalemia -Replace.  Repeat a.m. labs  Hyperlipidemia -continue statin and ezetimibe  Hypertension/hypertensive emergency -Patient presented with extremely elevated blood pressure and was encephalopathy.  Blood pressure improved, currently on amlodipine 10 mg, hydralazine 100 mg every 8   History of unspecified CVA with residual deficit -Continue aspirin and statin.  Anxiety and depression-continue sertraline and quetiapine  Scheduled Meds:  amLODipine  10 mg Oral Daily   aspirin EC  81 mg Oral Daily   atorvastatin  40 mg Oral Daily   enoxaparin (LOVENOX) injection  30 mg Subcutaneous Q24H   ezetimibe  10 mg Oral Daily   feeding supplement  237 mL Oral BID BM   insulin aspart  0-15 Units Subcutaneous TID WC   insulin aspart  0-5 Units Subcutaneous QHS   insulin glargine-yfgn  10 Units Subcutaneous Daily   levETIRAcetam  500  mg Oral BID   metoprolol succinate  25 mg Oral Daily   multivitamin with minerals  1 tablet Oral Daily   QUEtiapine  25 mg Oral QHS   sertraline  25 mg Oral Daily   Continuous Infusions:   PRN Meds:.acetaminophen **OR** acetaminophen (TYLENOL) oral liquid 160 mg/5 mL **OR** acetaminophen, hydrALAZINE, LORazepam, ondansetron (ZOFRAN)  IV, oxyCODONE, senna-docusate  Diet Orders (From admission, onward)     Start     Ordered   11/30/21 1454  Diet regular Room service appropriate? Yes; Fluid consistency: Thin  Diet effective now       Question Answer Comment  Room service appropriate? Yes   Fluid consistency: Thin      11/30/21 1453            DVT prophylaxis: enoxaparin (LOVENOX) injection 30 mg Start: 12/01/21 1200     Code Status: Full Code  Family Communication: no family at bedside   Status is: Inpatient  Remains inpatient appropriate because: AKI  Level of care: Telemetry Medical  Consultants:  Neurology   Procedures:  none  Microbiology  none  Antimicrobials: none    Objective: Vitals:   12/02/21 2226 12/02/21 2355 12/03/21 0347 12/03/21 0821  BP: (!) 151/81 132/68 127/61 (!) 148/79  Pulse: 88 93 80 81  Resp: 14 16 14 14   Temp:  99.4 F (37.4 C) 99.2 F (37.3 C) 98.6 F (37 C)  TempSrc:  Oral Oral Oral  SpO2:  98% 98% 100%  Weight:      Height:        Intake/Output Summary (Last 24 hours) at 12/03/2021 1132 Last data filed at 12/02/2021 1230 Gross per 24 hour  Intake 240 ml  Output --  Net 240 ml    Filed Weights   11/29/21 0000  Weight: 115.8 kg    Examination:  Constitutional: NAD Eyes: No scleral icterus ENMT: Moist mucous membranes Neck: normal, supple Respiratory: Clear bilaterally, no wheezing, no crackles Cardiovascular: Regular rate and rhythm, no murmurs, no edema Abdomen: Soft, nontender, nondistended, positive bowel sounds Musculoskeletal: no clubbing / cyanosis.  Skin: No rashes seen Neurologic: No focal deficits   Data Reviewed: I have independently reviewed following labs and imaging studies   CBC: Recent Labs  Lab 11/29/21 0228 11/29/21 0229 11/30/21 0836 12/02/21 0318 12/03/21 0225  WBC 5.1  --  7.1 6.6 6.2  NEUTROABS 3.9  --  4.2  --   --   HGB 16.1 6.1* 11.8* 10.8* 9.1*  HCT 44.4 18.0* 33.5* 31.6* 27.5*  MCV 82.5  --  84.4  87.3 89.0  PLT 145*  --  242 230 841    Basic Metabolic Panel: Recent Labs  Lab 11/29/21 0228 11/29/21 0229 11/29/21 0320 11/30/21 0836 12/01/21 0842 12/02/21 0318 12/03/21 0225  NA 139 141  --  140 141 141 141  K 2.8* 2.8*  --  3.0* 3.7 3.6 4.0  CL 105 102  --  108 113* 116* 115*  CO2 23  --   --  25 20* 21* 22  GLUCOSE 228* 230*  --  88 69* 61* 98  BUN 21* 23*  --  21* 25* 25* 29*  CREATININE 3.38* 3.40*  --  3.98* 3.81* 4.17* 4.43*  CALCIUM 8.5*  --   --  8.6* 8.6* 8.3* 8.1*  MG  --   --  1.8 1.8  --   --   --     Liver Function Tests: Recent Labs  Lab 11/29/21 0228 11/30/21  0836  AST 18 18  ALT 15 15  ALKPHOS 136* 122  BILITOT 1.1 1.0  PROT 6.5 6.5  ALBUMIN 3.1* 2.9*    Coagulation Profile: Recent Labs  Lab 11/29/21 0228  INR 1.2    HbA1C: No results for input(s): HGBA1C in the last 72 hours. CBG: Recent Labs  Lab 12/02/21 1107 12/02/21 1741 12/02/21 2125 12/03/21 0629 12/03/21 0654  GLUCAP 151* 80 201* 66* 86     Recent Results (from the past 240 hour(s))  Resp Panel by RT-PCR (Flu A&B, Covid) Nasopharyngeal Swab     Status: None   Collection Time: 11/29/21  5:34 PM   Specimen: Nasopharyngeal Swab; Nasopharyngeal(NP) swabs in vial transport medium  Result Value Ref Range Status   SARS Coronavirus 2 by RT PCR NEGATIVE NEGATIVE Final    Comment: (NOTE) SARS-CoV-2 target nucleic acids are NOT DETECTED.  The SARS-CoV-2 RNA is generally detectable in upper respiratory specimens during the acute phase of infection. The lowest concentration of SARS-CoV-2 viral copies this assay can detect is 138 copies/mL. A negative result does not preclude SARS-Cov-2 infection and should not be used as the sole basis for treatment or other patient management decisions. A negative result may occur with  improper specimen collection/handling, submission of specimen other than nasopharyngeal swab, presence of viral mutation(s) within the areas targeted by this  assay, and inadequate number of viral copies(<138 copies/mL). A negative result must be combined with clinical observations, patient history, and epidemiological information. The expected result is Negative.  Fact Sheet for Patients:  EntrepreneurPulse.com.au  Fact Sheet for Healthcare Providers:  IncredibleEmployment.be  This test is no t yet approved or cleared by the [place] FDA and  has been authorized for detection and/or diagnosis of SARS-CoV-2 by FDA under an Emergency Use Authorization (EUA). This EUA will remain  in effect (meaning this test can be used) for the duration of the COVID-19 declaration under Section 564(b)(1) of the Act, 21 U.S.C.section 360bbb-3(b)(1), unless the authorization is terminated  or revoked sooner.       Influenza A by PCR NEGATIVE NEGATIVE Final   Influenza B by PCR NEGATIVE NEGATIVE Final    Comment: (NOTE) The Xpert Xpress SARS-CoV-2/FLU/RSV plus assay is intended as an aid in the diagnosis of influenza from Nasopharyngeal swab specimens and should not be used as a sole basis for treatment. Nasal washings and aspirates are unacceptable for Xpert Xpress SARS-CoV-2/FLU/RSV testing.  Fact Sheet for Patients: EntrepreneurPulse.com.au  Fact Sheet for Healthcare Providers: IncredibleEmployment.be  This test is not yet approved or cleared by the [place] FDA and has been authorized for detection and/or diagnosis of SARS-CoV-2 by FDA under an Emergency Use Authorization (EUA). This EUA will remain in effect (meaning this test can be used) for the duration of the COVID-19 declaration under Section 564(b)(1) of the Act, 21 U.S.C. section 360bbb-3(b)(1), unless the authorization is terminated or revoked.  Performed at [facility], [place] [address], [place], [place] [address]       Radiology Studies: No results found.   Marzetta Board, MD, PhD [facility]  Between 7 am - 7 pm I am available, please contact me via Amion (for emergencies) or Securechat (non urgent messages)  Between 7 pm - 7 am I am not available, please contact night coverage MD/APP via Amion

## 2021-12-03 NOTE — Progress Notes (Addendum)
OT was working with pt and pt became agitated and aggressive with therapist, threw walker  per OT therapist stated.  OT stated that after she left the room she heard a clatter and looked into the room and pt was getting self up off the floor,  then pt  walked to the door and punched the door with his hand in front of the therapist.  Pt wanting to go home, wants his papers to go.   Security called by unit charge nurse to come up to go in with staff, MD Dr Cruzita Lederer was notified of the above and asked to come see pt.    Security came up to the unit, but did not have to go in. Dr Cruzita Lederer went to see pt and spoke with him.   Pt calm now, denied falling, stated he slid down in the floor, now does not want to go home, wants to take a shower.    Dr Cruzita Lederer d/c'd pt's telemetry order and placed order for pt to shower.  This RN stayed with pt, assisted him into the shower, pt able to bathe self, and had min assist getting dressed afterwards with his underwear, shirt and PJ pants.  Pt was cooperative, calm and appreciative during the time of bath, pt brushed his teeth at sink sitting.   SBA back to the recliner chair.   Pt put on his own bedroom shoes. Resting in recliner chair with call bell at bedside in reach.    Pt's fiance Barbette Or called as she was aware of pt being upset with therapist, she was reassured and pt was calm and cooperative now and being assisted with a shower.    Pt states he just doesn't like people asking him so many questions, personal questions.  Explained to pt why hospital staff, doctors, nurses, therapist need to ask so many questions as this is how they get  the information they need to help him here at hospital and for when he goes home, needing therapy at home or equipment at home.   Pt verbalizes understanding.     He will need continued reminders as to why staff need to ask questions to care for him..   OT  to place a note also.

## 2021-12-03 NOTE — Care Management Important Message (Signed)
Important Message  Patient Details  Name: Brandon [name] MRN: [id number] Date of Birth: 04/01/1961   Medicare Important Message Given:  Yes     [name] 12/03/2021, 2:10 PM

## 2021-12-03 NOTE — Progress Notes (Signed)
Occupational Therapy Treatment Patient Details Name: Brandon [name] MRN: [id number] DOB: 10/17/1961 Today's Date: 12/03/2021   History of present illness Pt is a 60 y/o male admitted secondary to AMS. Found to be hypertensive and hyperglycemic. PMH includes DM, HTN, CVA with L deficits, schizophrenia, seizure, and CKD.   OT comments  Session limited by agitation and aggression this session. Pt seated in recliner on arrival, agreeable to mobilize. Pt stood and took a few steps, becoming increasingly agitated, requesting OT leaving him alone. OT attempting to place chair alarm in chair when pt refused sitting in chair,stating that he did not need that. OT went to inform RN, when outside room, OT heard clattering and returned to room with pt getting up off the floor and throwing RW. OT ensuring pt was safe before terminating session. Pt requiring min guard for all mobility for safety. Acute OT will continue to follow.    Recommendations for follow up therapy are one component of a multi-disciplinary discharge planning process, led by the attending physician.  Recommendations may be updated based on patient status, additional functional criteria and insurance authorization.    Follow Up Recommendations  Skilled nursing-short term rehab (<3 hours/day)    Assistance Recommended at Discharge Frequent or constant Supervision/Assistance  Equipment Recommendations  None recommended by OT    Recommendations for Other Services      Precautions / Restrictions Precautions Precautions: Fall Precaution Comments: Violence Risk Restrictions Weight Bearing Restrictions: No       Mobility Bed Mobility               General bed mobility comments: Pt up in recliner on OT entry    Transfers Overall transfer level: Needs assistance Equipment used: None Transfers: Sit to/from Stand Sit to Stand: Min guard           General transfer comment: Pt able to stand from chair and floor with no  physical assist, min guard for safety     Balance Overall balance assessment: Needs assistance Sitting-balance support: No upper extremity supported;Feet supported Sitting balance-Leahy Scale: Fair     Standing balance support: During functional activity;No upper extremity supported Standing balance-Leahy Scale: Fair                             ADL either performed or assessed with clinical judgement   ADL Overall ADL's : Needs assistance/impaired                                       General ADL Comments: session focused on transfers and pt safety    Extremity/Trunk Assessment              Vision       Perception     Praxis      Cognition Arousal/Alertness: Awake/alert Behavior During Therapy: Agitated;Impulsive Overall Cognitive Status: History of cognitive impairments - at baseline                                 General Comments: Pt easily agitated, does not like answering questions, can become violent if overly agitated          Exercises     Shoulder Instructions       General Comments Pt had unwitnessed fall during session, able to stnad  from floor with no assist. RN and MD notified. Pt becoming violent at end of session, punching door.    Pertinent Vitals/ Pain       Pain Assessment: No/denies pain  Home Living                                          Prior Functioning/Environment              Frequency  Min 2X/week        Progress Toward Goals  OT Goals(current goals can now be found in the care plan section)  Progress towards OT goals: Progressing toward goals  Acute Rehab OT Goals Patient Stated Goal: For OT to leave him alone OT Goal Formulation: With patient Time For Goal Achievement: 12/14/21 Potential to Achieve Goals: Good ADL Goals Pt Will Perform Grooming: with modified independence;standing Pt Will Perform Lower Body Dressing: with set-up;sit to/from  stand Pt Will Transfer to Toilet: with modified independence;ambulating Pt/caregiver will Perform Home Exercise Program: Increased ROM;Increased strength;Left upper extremity;With written HEP provided Additional ADL Goal #1: Pt will indep complete 3 step way-finding task Additional ADL Goal #2: Pt will recall at least 3 fall prevention strategies to implement into his home environment  Plan Discharge plan remains appropriate;Frequency remains appropriate    Co-evaluation                 AM-PAC OT "6 Clicks" Daily Activity     Outcome Measure   Help from another person eating meals?: A Little Help from another person taking care of personal grooming?: A Little Help from another person toileting, which includes using toliet, bedpan, or urinal?: A Little Help from another person bathing (including washing, rinsing, drying)?: A Lot Help from another person to put on and taking off regular upper body clothing?: A Little Help from another person to put on and taking off regular lower body clothing?: A Lot 6 Click Score: 16    End of Session    OT Visit Diagnosis: Unsteadiness on feet (R26.81);Other abnormalities of gait and mobility (R26.89);Muscle weakness (generalized) (M62.81);Pain;Hemiplegia and hemiparesis Hemiplegia - Right/Left: Left Hemiplegia - dominant/non-dominant: Non-Dominant Hemiplegia - caused by: Cerebral infarction   Activity Tolerance Treatment limited secondary to agitation   Patient Left in chair;with call bell/phone within reach (Pt refusing chair alarm)   Nurse Communication Mobility status;Other (comment) (Pt had unwitnessed fall and violent episode)        Time: 0947-0962 OT Time Calculation (min): 18 min  Charges: OT General Charges $OT Visit: 1 Visit OT Treatments $Therapeutic Activity: 8-22 mins  Domnique Vantine H., OTR/L Acute Rehabilitation  Tymeer Vaquera Elane Rudy Luhmann 12/03/2021, 8:30 PM

## 2021-12-03 NOTE — Progress Notes (Signed)
Nephrology Follow-Up Consult note   Assessment/Recommendations: Brandon [name] is a/an 60 y.o. 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  Has slightly worsened during this hospitalization.  Baseline CKD likely related to diabetic kidney disease, arterionephrosclerosis, renal mass loss from cryoablation, secondary FSGS.  Unclear cause of current AKI (although he is only slightly above his baseline).  Could represent some progression but also some hemodynamic changes related to hypertension control -Holding IV fluids given edema -Would maintain in the hospital until creatinine is stable or downtrending -Continue to monitor daily Cr, Dose meds for GFR -Monitor Daily I/Os, Daily weight  -Maintain MAP>65 for optimal renal perfusion.  -Avoid nephrotoxic medications including NSAIDs -Currently no indication for HD -Discussed that the patient will likely have progressive chronic kidney disease and may require dialysis in the next 6 to 12 months based on previous progression.  Patient seems to be in a phase of denial.  I discussed setting him up for an appointment in our office but he continues to be hesitant.  We will continue discussions.   Volume Status: Appears slightly volume overloaded on exam.  Holding IV fluids.   Hypertension: Improved control.  May want him to run slightly higher given he was noncompliant with medications at home.  Stopping hydralazine today and switching to metoprolol 25 mg daily for more evidence-based therapy and easier dosing given it is daily. Also on amlodipine   Anemia: Mild anemia with hemoglobin of 9.1.  CKD may be contributing.  Continue to monitor for now and obtain iron levels.   Uncontrolled Diabetes Mellitus Type 2 with  Hyperglycemia: Also with some hypoglycemia.  Management per primary team   Altered mental status: Unclear cause but improved.  Management per primary team  Emesis: Does not sound like large volume.  Hard to get details.  Unclear cause.  Unlikely to be related to uremia.  Continue to monitor   Recommendations conveyed to primary service.    [facility] 12/03/2021 10:36 AM  ___________________________________________________________  CC: Altered mental status  Interval History/Subjective: Patient states he is feeling well today.  Does admit that he has been having some episodes of vomiting.  Vomited twice yesterday after drinking some water.  Says that he is not nauseated but sometimes when he has a lot of pain and will make him throw up.  No blood in his emesis.  Denies any diarrhea.  No fevers or chills.   Medications:  Current Facility-Administered Medications  Medication Dose Route Frequency Provider Last Rate Last Admin   acetaminophen (TYLENOL) tablet 650 mg  650 mg Oral Q4H PRN Karmen Bongo, MD       Or   acetaminophen (TYLENOL) 160 MG/5ML solution 650 mg  650 mg Per Tube Q4H PRN Karmen Bongo, MD       Or   acetaminophen (TYLENOL) suppository 650 mg  650 mg Rectal Q4H PRN Karmen Bongo, MD       amLODipine (NORVASC) tablet 10 mg  10 mg Oral Daily Aline August, MD   10 mg at 12/02/21 1008   aspirin EC tablet 81 mg  81 mg Oral Daily Karmen Bongo, MD   81 mg at 12/01/21 0920   atorvastatin (LIPITOR) tablet 40 mg  40 mg Oral Daily Karmen Bongo,  MD   40 mg at 12/02/21 1008   enoxaparin (LOVENOX) injection 30 mg  30 mg Subcutaneous Q24H Avik, Leoni, RPH   30 mg at 12/02/21 1230   ezetimibe (ZETIA) tablet 10 mg  10 mg Oral Daily Karmen Bongo, MD   10 mg at 12/02/21 1008   feeding supplement (ENSURE ENLIVE / ENSURE PLUS) liquid 237 mL  237 mL Oral BID BM Alekh, Kshitiz, MD   237 mL at 12/02/21 1230   hydrALAZINE (APRESOLINE)  injection 5 mg  5 mg Intravenous Q4H PRN Karmen Bongo, MD       insulin aspart (novoLOG) injection 0-15 Units  0-15 Units Subcutaneous TID WC Karmen Bongo, MD   3 Units at 12/02/21 1229   insulin aspart (novoLOG) injection 0-5 Units  0-5 Units Subcutaneous QHS Karmen Bongo, MD   2 Units at 12/02/21 2219   insulin glargine-yfgn (SEMGLEE) injection 20 Units  20 Units Subcutaneous Daily Caren Griffins, MD   20 Units at 12/02/21 1008   levETIRAcetam (KEPPRA) tablet 500 mg  500 mg Oral BID Karmen Bongo, MD   500 mg at 12/02/21 2228   LORazepam (ATIVAN) injection 4 mg  4 mg Intravenous Q5 Min x 2 PRN Karmen Bongo, MD       metoprolol succinate (TOPROL-XL) 24 hr tablet 25 mg  25 mg Oral Daily Reesa Chew, MD       multivitamin with minerals tablet 1 tablet  1 tablet Oral Daily Aline August, MD   1 tablet at 12/02/21 1008   ondansetron (ZOFRAN) injection 4 mg  4 mg Intravenous Q6H PRN Caren Griffins, MD   4 mg at 12/01/21 3235   oxyCODONE (Oxy IR/ROXICODONE) immediate release tablet 5 mg  5 mg Oral Q6H PRN Caren Griffins, MD   5 mg at 12/02/21 1805   QUEtiapine (SEROQUEL) tablet 25 mg  25 mg Oral Ivery Quale, MD   25 mg at 12/02/21 2228   senna-docusate (Senokot-S) tablet 1 tablet  1 tablet Oral QHS PRN Karmen Bongo, MD       sertraline (ZOLOFT) tablet 25 mg  25 mg Oral Daily Karmen Bongo, MD   25 mg at 12/02/21 1008      Review of Systems: 10 systems reviewed and negative except per interval history/subjective  Physical Exam: Vitals:   12/03/21 0347 12/03/21 0821  BP: 127/61 (!) 148/79  Pulse: 80 81  Resp: 14 14  Temp: 99.2 F (37.3 C) 98.6 F (37 C)  SpO2: 98% 100%   No intake/output data recorded.  Intake/Output Summary (Last 24 hours) at 12/03/2021 1036 Last data filed at 12/02/2021 1230 Gross per 24 hour  Intake 240 ml  Output --  Net 240 ml   Constitutional: well-appearing, no acute distress ENMT: ears and nose without scars or  lesions, MMM CV: normal rate, 1+ pitting edema in the bilateral lower extremities at the shins Respiratory: Chest rise, normal work of breathing Gastrointestinal: soft, non-tender, no palpable masses or hernias Skin: no visible lesions or rashes Psych: alert, appropriate mood and affect   Test Results I personally reviewed new and old clinical labs and radiology tests Lab Results  Component Value Date   NA 141 12/03/2021   K 4.0 12/03/2021   CL 115 (H) 12/03/2021   CO2 22 12/03/2021   BUN 29 (H) 12/03/2021   CREATININE 4.43 (H) 12/03/2021   CALCIUM 8.1 (L) 12/03/2021   ALBUMIN 2.9 (L) 11/30/2021   PHOS 4.0 07/05/2021

## 2021-12-04 DIAGNOSIS — R4182 Altered mental status, unspecified: Secondary | ICD-10-CM

## 2021-12-04 LAB — BASIC METABOLIC PANEL
Anion gap: 5 (ref 5–15)
BUN: 29 mg/dL — ABNORMAL HIGH (ref 6–20)
CO2: 22 mmol/L (ref 22–32)
Calcium: 8.1 mg/dL — ABNORMAL LOW (ref 8.9–10.3)
Chloride: 111 mmol/L (ref 98–111)
Creatinine, Ser: 4.19 mg/dL — ABNORMAL HIGH (ref 0.61–1.24)
GFR, Estimated: 16 mL/min — ABNORMAL LOW (ref >60)
Glucose, Bld: 219 mg/dL — ABNORMAL HIGH (ref 70–99)
Potassium: 4.1 mmol/L (ref 3.5–5.1)
Sodium: 138 mmol/L (ref 135–145)

## 2021-12-04 LAB — GLUCOSE, CAPILLARY
Glucose-Capillary: 140 mg/dL — ABNORMAL HIGH (ref 70–99)
Glucose-Capillary: 208 mg/dL — ABNORMAL HIGH (ref 70–99)

## 2021-12-04 MED ORDER — OXYCODONE HCL 5 MG PO TABS: 5.0000 mg | ORAL_TABLET | Freq: Four times a day (QID) | ORAL | 0 refills | Status: AC | PRN

## 2021-12-04 NOTE — Plan of Care (Signed)
°  Problem: Education: Goal: Knowledge of General Education information will improve Description: Including pain rating scale, medication(s)/side effects and non-pharmacologic comfort measures 12/04/2021 1153 by Dorris Carnes, RN Outcome: Adequate for Discharge 12/04/2021 1153 by Dorris Carnes, RN Outcome: Progressing   Problem: Health Behavior/Discharge Planning: Goal: Ability to manage health-related needs will improve 12/04/2021 1153 by Dorris Carnes, RN Outcome: Adequate for Discharge 12/04/2021 1153 by Dorris Carnes, RN Outcome: Progressing   Problem: Clinical Measurements: Goal: Ability to maintain clinical measurements within normal limits will improve 12/04/2021 1153 by Dorris Carnes, RN Outcome: Adequate for Discharge 12/04/2021 1153 by Dorris Carnes, RN Outcome: Progressing Goal: Will remain free from infection 12/04/2021 1153 by Dorris Carnes, RN Outcome: Adequate for Discharge 12/04/2021 1153 by Dorris Carnes, RN Outcome: Progressing Goal: Diagnostic test results will improve 12/04/2021 1153 by Dorris Carnes, RN Outcome: Adequate for Discharge 12/04/2021 1153 by Dorris Carnes, RN Outcome: Progressing Goal: Respiratory complications will improve 12/04/2021 1153 by Dorris Carnes, RN Outcome: Adequate for Discharge 12/04/2021 1153 by Dorris Carnes, RN Outcome: Progressing Goal: Cardiovascular complication will be avoided 12/04/2021 1153 by Dorris Carnes, RN Outcome: Adequate for Discharge 12/04/2021 1153 by Dorris Carnes, RN Outcome: Progressing   Problem: Activity: Goal: Risk for activity intolerance will decrease 12/04/2021 1153 by Dorris Carnes, RN Outcome: Adequate for Discharge 12/04/2021 1153 by Dorris Carnes, RN Outcome: Progressing   Problem: Nutrition: Goal: Adequate nutrition will be maintained 12/04/2021 1153 by Dorris Carnes, RN Outcome: Adequate for Discharge 12/04/2021 1153  by Dorris Carnes, RN Outcome: Progressing   Problem: Coping: Goal: Level of anxiety will decrease 12/04/2021 1153 by Dorris Carnes, RN Outcome: Adequate for Discharge 12/04/2021 1153 by Dorris Carnes, RN Outcome: Progressing   Problem: Elimination: Goal: Will not experience complications related to bowel motility 12/04/2021 1153 by Dorris Carnes, RN Outcome: Adequate for Discharge 12/04/2021 1153 by Dorris Carnes, RN Outcome: Progressing Goal: Will not experience complications related to urinary retention 12/04/2021 1153 by Dorris Carnes, RN Outcome: Adequate for Discharge 12/04/2021 1153 by Dorris Carnes, RN Outcome: Progressing   Problem: Pain Managment: Goal: General experience of comfort will improve 12/04/2021 1153 by Dorris Carnes, RN Outcome: Adequate for Discharge 12/04/2021 1153 by Dorris Carnes, RN Outcome: Progressing   Problem: Safety: Goal: Ability to remain free from injury will improve 12/04/2021 1153 by Dorris Carnes, RN Outcome: Adequate for Discharge 12/04/2021 1153 by Dorris Carnes, RN Outcome: Progressing   Problem: Skin Integrity: Goal: Risk for impaired skin integrity will decrease 12/04/2021 1153 by Dorris Carnes, RN Outcome: Adequate for Discharge 12/04/2021 1153 by Dorris Carnes, RN Outcome: Progressing   Problem: Education: Goal: Knowledge of disease or condition will improve 12/04/2021 1153 by Dorris Carnes, RN Outcome: Progressing 12/04/2021 1153 by Dorris Carnes, RN Outcome: Progressing Goal: Knowledge of secondary prevention will improve ([facility] ALL) 12/04/2021 1153 by Dorris Carnes, RN Outcome: Progressing 12/04/2021 1153 by Dorris Carnes, RN Outcome: Progressing

## 2021-12-04 NOTE — Progress Notes (Signed)
Teodoro Kil D/C'd Home per MD order.  Discussed with the patient and all questions fully answered.  VSS, Skin clean, dry and intact without evidence of skin break down, no evidence of skin tears noted. IV catheter discontinued intact. Site without signs and symptoms of complications. Dressing and pressure applied.  An After Visit Summary was printed and given to the patient. Patient received prescription.  D/c education completed with patient/girl friend including follow up instructions, medication list, d/c activities limitations if indicated, with other d/c instructions as indicated by MD - patient able to verbalize understanding, all questions fully answered.   Patient instructed to return to ED, call 911, or call MD for any changes in condition.   Patient escorted via [place], and D/C home via private auto.  Dorris Carnes 12/04/2021 4:03 PM

## 2021-12-04 NOTE — Plan of Care (Signed)
  Problem: Clinical Measurements: Goal: Ability to maintain clinical measurements within normal limits will improve Outcome: Progressing Goal: Will remain free from infection Outcome: Progressing Goal: Diagnostic test results will improve Outcome: Progressing Goal: Respiratory complications will improve Outcome: Progressing Goal: Cardiovascular complication will be avoided Outcome: Progressing   Problem: Safety: Goal: Ability to remain free from injury will improve Outcome: Progressing   Problem: Pain Managment: Goal: General experience of comfort will improve Outcome: Progressing   

## 2021-12-04 NOTE — Progress Notes (Signed)
Nephrology Follow-Up Consult note   Assessment/Recommendations: Brandon [name] is a/an 60 y.o. 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  Creatinine now stable this hospitalization around 4.  Baseline CKD likely related to diabetic kidney disease, arterionephrosclerosis, renal mass loss from cryoablation, secondary FSGS.   -Creatinine downtrending today -Continue to monitor daily Cr, Dose meds for GFR -Monitor Daily I/Os, Daily weight  -Maintain MAP>65 for optimal renal perfusion.  -Avoid nephrotoxic medications including NSAIDs -Currently no indication for HD -Patient seems to be in denial about his chronic kidney disease.  He is not willing to talk to me or follow-up in our clinic.  We will be happy to see him if he becomes agreeable.  I think he is likely to need dialysis sometime in the next year.   Volume Status: Appears slightly volume overloaded on exam.  Holding IV fluids.   Hypertension: Improved control.  May want him to run slightly higher given he was noncompliant with medications at home.  Continue metoprolol and amlodipine as ordered.   Anemia: Mild anemia.  CKD contributing.  May benefit from ESA's if he is agreeable in the future.  Iron not needed at this time   Uncontrolled Diabetes Mellitus Type 2 with Hyperglycemia: Also with some hypoglycemia.  Management per primary team   Altered mental status: Unclear cause but improved.  Management per primary team  Emesis: Patient states it has improved.  Cause remains unclear.   Recommendations conveyed to primary service.    [facility] 12/04/2021 9:45 AM  ___________________________________________________________  CC: Altered mental  status  Interval History/Subjective: The patient states he feels well today.  Denies difficulty urinating, shortness of breath, chest pain.  Creatinine slightly improved to 4.2 today.  The patient states that he is adamant his kidneys will improve.  We discussed that he does have chronic kidney disease that is unlikely to go away given the longstanding diabetes and hypertension.  When discussing the patient's kidney disease today he became very angry and said "tell me something different.  I do not want to hear the same thing about my kidneys."  I discussed that it is important to think about his kidney disease and how we can best treat him and prevent further damage.  We also discussed that at some point he will likely require dialysis.  At this point the patient became very irate and began cursing at me and told me to get out of his room.  He is not interested in following up with [place].  Medications:  Current Facility-Administered Medications  Medication Dose Route Frequency Provider Last Rate Last Admin   acetaminophen (TYLENOL) tablet 650 mg  650 mg Oral Q4H PRN Karmen Bongo, MD       Or   acetaminophen (TYLENOL) 160 MG/5ML solution 650 mg  650 mg Per Tube Q4H PRN Karmen Bongo, MD       Or   acetaminophen (TYLENOL) suppository 650 mg  650 mg Rectal Q4H PRN Karmen Bongo, MD       amLODipine (NORVASC) tablet 10 mg  10 mg Oral Daily Aline August, MD   10 mg at 12/04/21 0912   aspirin EC tablet 81 mg  81 mg Oral Daily Karmen Bongo, MD   81 mg at  12/04/21 0912   atorvastatin (LIPITOR) tablet 40 mg  40 mg Oral Daily Karmen Bongo, MD   40 mg at 12/04/21 0912   enoxaparin (LOVENOX) injection 30 mg  30 mg Subcutaneous Q24H Gram, Siedlecki, RPH   30 mg at 12/03/21 1200   ezetimibe (ZETIA) tablet 10 mg  10 mg Oral Daily Karmen Bongo, MD   10 mg at 12/04/21 0912   feeding supplement (ENSURE ENLIVE / ENSURE PLUS) liquid 237 mL  237 mL Oral BID BM Aline August, MD   237 mL at  12/04/21 0913   hydrALAZINE (APRESOLINE) injection 5 mg  5 mg Intravenous Q4H PRN Karmen Bongo, MD       insulin aspart (novoLOG) injection 0-15 Units  0-15 Units Subcutaneous TID [facility] Karmen Bongo, MD   2 Units at 12/04/21 0645   insulin aspart (novoLOG) injection 0-5 Units  0-5 Units Subcutaneous Ivery Quale, MD   2 Units at 12/03/21 2201   insulin glargine-yfgn [facility]) injection 10 Units  10 Units Subcutaneous Daily Caren Griffins, MD   10 Units at 12/04/21 0913   levETIRAcetam (KEPPRA) tablet 500 mg  500 mg Oral BID Karmen Bongo, MD   500 mg at 12/04/21 0912   LORazepam (ATIVAN) injection 4 mg  4 mg Intravenous Q5 Min x 2 PRN Karmen Bongo, MD       metoprolol succinate (TOPROL-XL) 24 hr tablet 25 mg  25 mg Oral Daily Reesa Chew, MD   25 mg at 12/04/21 5366   multivitamin with minerals tablet 1 tablet  1 tablet Oral Daily Aline August, MD   1 tablet at 12/04/21 0912   ondansetron (ZOFRAN) injection 4 mg  4 mg Intravenous Q6H PRN Caren Griffins, MD   4 mg at 12/01/21 4403   oxyCODONE (Oxy IR/ROXICODONE) immediate release tablet 5 mg  5 mg Oral Q6H PRN Caren Griffins, MD   5 mg at 12/04/21 0919   QUEtiapine (SEROQUEL) tablet 25 mg  25 mg Oral Ivery Quale, MD   25 mg at 12/03/21 2200   senna-docusate (Senokot-S) tablet 1 tablet  1 tablet Oral QHS PRN Karmen Bongo, MD       sertraline (ZOLOFT) tablet 25 mg  25 mg Oral Daily Karmen Bongo, MD   25 mg at 12/04/21 4742      Review of Systems: 10 systems reviewed and negative except per interval history/subjective  Physical Exam: Vitals:   12/04/21 0410 12/04/21 0805  BP: 133/84 137/79  Pulse: 70 64  Resp: 20 16  Temp: 97.8 F (36.6 C) 98.6 F (37 C)  SpO2: 100% 100%   No intake/output data recorded.  Intake/Output Summary (Last 24 hours) at 12/04/2021 0945 Last data filed at 12/03/2021 2358 Gross per 24 hour  Intake 720 ml  Output --  Net 720 ml   Constitutional: well-appearing,  no acute distress ENMT: ears and nose without scars or lesions, MMM CV: normal rate Respiratory: Chest rise, normal work of breathing Gastrointestinal: soft, non-tender, no palpable masses or hernias Skin: no visible lesions or rashes Psych: alert, appropriate mood and affect   Test Results I personally reviewed new and old clinical labs and radiology tests Lab Results  Component Value Date   NA 138 12/04/2021   K 4.1 12/04/2021   CL 111 12/04/2021   CO2 22 12/04/2021   BUN 29 (H) 12/04/2021   CREATININE 4.19 (H) 12/04/2021   CALCIUM 8.1 (L) 12/04/2021   ALBUMIN 2.9 (L) 11/30/2021  PHOS 4.0 07/05/2021

## 2021-12-04 NOTE — Discharge Summary (Signed)
Physician Discharge Summary  Brandon [name] DHR:[id number] DOB: 11-Aug-1961 DOA: 11/29/2021  PCP: Carlena Hurl, PA-C  Admit date: 11/29/2021 Discharge date: 12/04/2021  Admitted From: home Disposition:  home (refusing SNF)  Recommendations for Outpatient Follow-up:  Follow up with PCP in 1-2 weeks Please obtain BMP/CBC in one week  Home Health: PT Equipment/Devices: none  Discharge Condition: stable CODE STATUS: Full code Diet recommendation: heart healthy  HPI: Per admitting MD, Brandon [name] is a 60 y.o. male with medical history significant of CVA with associated seizures (2014); stage 3 CKD; B RCC; DM; GERD; HTN; HLD; OSA; and hypothyroidism presenting with AMS.  His fiancee reported that he was acting a little strange and he reported that he had been taking his usual medications.  He got worse and started complaining of headache.  She tried to give him insulin and he wouldn't let her do it.  Headache started last night.  No vision changes.  He reports that his head is not hurting now.  He was confused at the time of admission and reported living alone and not having a NOK (despite his girlfriend sitting at the bedside).  Hospital Course / Discharge diagnoses: Principal Problem Acute metabolic encephalopathy, weakness-patient with known history of prior CVA and seizures, came to the hospital with confusion.  He underwent a CT scan of the head as well as MRI which did not show any acute intracranial abnormalities.  EEG was negative for seizures.  Neurology consulted, possibly has been altered mental status from hypertensive emergency/metabolic derangements with AKI.  Mental status has returned to baseline with treatment of his blood pressure.  PT recommends SNF however he refused and will be discharged home.   Active Problems Acute kidney injury, on chronic kidney disease stage IV-Baseline creatinine 2.7-2.9, although creatinine a month ago was 2.3.  Creatinine increasing  now into the 3.8-4 range.  Nephrology consulted.  This may be his new baseline, will need outpatient follow-up. Patient was told that he may need dialysis in 6 to 12 months but he wants to remain positive and does not accept that.  Left flank pain-acute on chronic, thinks it is due to "nerve pain from his kidneys" after having procedure done in the past.  Improved with oxycodone.  He ran out of pain medications at home, short course will be given to him over the long weekend until his PCPs office opens up  Nausea, vomiting-supportive management, possible due to AKI/hypertensive emergency.  Resolved  History of bilateral renal cell carcinomas-status post percutaneous ablation in the past, 2012 Seizure disorder -EEG as above.  Continue Keppra. 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  Has been having couple of hypoglycemic episodes and his insulin dose had to be reduced.  Suspect related to hospital controlled diet as well as worsening creatinine.  His home dose was cut in half upon discharge Hypokalemia -Replaced Hyperlipidemia -continue statin and ezetimibe Hypertension/hypertensive emergency -Patient presented with extremely elevated blood pressure and was encephalopathy.  Blood pressure improved, currently on amlodipine 10 mg, hydralazine 100 mg every 8 with good control.  Patient believes that his elevated blood pressure on admission was related to his diet as he ate something quite salty prior to that  History of unspecified CVA with residual deficit -Continue aspirin and statin. Anxiety and depression-continue home medications  Sepsis ruled out   Discharge Instructions   Allergies as of 12/04/2021       Reactions   Morphine Itching  Medication List     TAKE these medications    ACCU-CHEK ACTIVE STRIPS test strip Generic drug: glucose blood Use as instructed What changed:  how much to take how to take this when to take this additional  instructions   glucose blood test strip accu chek active 1-2 times daily What changed:  how much to take how to take this when to take this   accu-chek soft touch lancets Use as instructed What changed:  how much to take how to take this when to take this additional instructions   amLODipine 10 MG tablet Commonly known as: NORVASC TAKE 1 TABLET BY MOUTH EVERY DAY   aspirin EC 81 MG tablet Take 81 mg by mouth daily. Swallow whole.   atorvastatin 40 MG tablet Commonly known as: LIPITOR Take 1 tablet (40 mg total) by mouth daily.   blood glucose meter kit and supplies Dispense based on patient and insurance preference. Use up to four times daily as directed. (FOR ICD-9 250.00, 250.01). What changed:  how much to take how to take this when to take this   ezetimibe 10 MG tablet Commonly known as: ZETIA Take 1 tablet (10 mg total) by mouth daily.   hydrALAZINE 100 MG tablet Commonly known as: APRESOLINE Take 1 tablet (100 mg total) by mouth every 8 (eight) hours.   insulin aspart 100 UNIT/ML FlexPen Commonly known as: NOVOLOG Inject 2-10 Units into the skin 3 (three) times daily with meals. If glucose 150-200 take 2 units, glucose 201-250 take 4 units, 251-300 take 6 units, glucose 301-350 take 8 units, glucose 351-400 take 10 units   insulin glargine 100 UNIT/ML Solostar Pen Commonly known as: LANTUS Inject 20 Units into the skin daily. What changed: how much to take   levETIRAcetam 500 MG tablet Commonly known as: KEPPRA Take 2 tablets (1,000 mg total) by mouth daily. What changed:  how much to take when to take this   NovoFine Plus Pen Needle 32G X 4 MM Misc Generic drug: Insulin Pen Needle Use as directed with insulin pen   omeprazole 40 MG capsule Commonly known as: PRILOSEC Take 1 capsule (40 mg total) by mouth daily. What changed:  when to take this reasons to take this   oxyCODONE 5 MG immediate release tablet Commonly known as: Oxy  IR/ROXICODONE Take 1 tablet (5 mg total) by mouth every 6 (six) hours as needed for up to 3 days for severe pain or moderate pain.   QUEtiapine 25 MG tablet Commonly known as: SEROQUEL Take 1 tablet (25 mg total) by mouth at bedtime.   sertraline 25 MG tablet Commonly known as: ZOLOFT Take 1 tablet (25 mg total) by mouth daily.        Consultations: Neurology   Procedures/Studies:  MR BRAIN WO CONTRAST  Result Date: 11/29/2021 CLINICAL DATA:  Follow-up stroke.  Acute neurological deficit. EXAM: MRI HEAD WITHOUT CONTRAST TECHNIQUE: Multiplanar, multiecho pulse sequences of the brain and surrounding structures were obtained without intravenous contrast. COMPARISON:  Head CT earlier same day.  MRI 07/05/2021. FINDINGS: Brain: Diffusion imaging does not show any acute or subacute infarction. No focal abnormality affects the brainstem or cerebellum. Left cerebral hemisphere shows mild chronic small-vessel ischemic change of the white matter. On the right, there is old infarction in the right MCA territory affecting the right temporal lobe, insula and temporoparietal junction. This is shows atrophy, encephalomalacia and gliosis. No evidence of recent extension. No sign of intracranial hemorrhage. No obstructive hydrocephalus. No extra-axial collection.  Vascular: Major vessels at the base of the brain show flow. Skull and upper cervical spine: Negative Sinuses/Orbits: Clear/normal Other: None IMPRESSION: No change. No acute MR finding. Old infarction in the right middle cerebral artery territory. Mild chronic small-vessel ischemic change elsewhere within the hemispheric white matter. Electronically Signed   By: Nelson Chimes M.D.   On: 11/29/2021 13:03   US RENAL  Result Date: 11/30/2021 CLINICAL DATA:  Acute kidney injury. Status post ablation of renal cell carcinoma. EXAM: RENAL / URINARY TRACT ULTRASOUND COMPLETE COMPARISON:  CT AP 04/05/2020 FINDINGS: Right Kidney: Renal measurements: 9.2 x  5.0 x 4.2 cm = volume: 101.2 ml. Calcified lesion within upper pole of right kidney measures 1.9 x 1.1 x 1.4 cm corresponding to prior cryoablation site. Echogenicity within normal limits. No hydronephrosis visualized. Left Kidney: Renal measurements: 11.0 x 7.1 x 5.8 cm = volume: 236.6 mL. Calcification within the inferior pole of the left kidney measures 2.4 x 0.8 x 1.9 cm and corresponds to previous cryoablation site. Echogenicity within normal limits. No mass or hydronephrosis visualized. Bladder: Decompressed urinary bladder is suboptimally evaluated. The left ureteral jet is not confidently visualized. Other: None. IMPRESSION: 1. No acute abnormality identified.  No hydronephrosis identified. 2. Bilateral calcified lesions are identified within the upper pole of the right kidney and lower pole of left kidney corresponding to previous cryoablation sites. Electronically Signed   By: Kerby Moors M.D.   On: 11/30/2021 11:49   EEG adult now  Result Date: 11/29/2021 Lora Havens, MD     11/29/2021  4:04 PM Patient Name: Brandon [name] MRN: [id number] Epilepsy Attending: Lora Havens Referring Physician/Provider: Dr Karmen Bongo Date: 11/29/2021 Duration: 22.59 mins Patient history: 60 y.o. male with PMH significant for history of stroke in 2013 with residual mild left-sided weakness, bilateral renal cancer, depression, diabetes with prior history of DKA, hyperlipidemia, hypertension, focal seizures on Keppra who presents with headache, agitation and encephalopathy in the setting of severe hyperglycemia and malignant hypertension. EEG to evaluate for seizure Level of alertness: Awake, asleep AEDs during EEG study: LEV Technical aspects: This EEG study was done with scalp electrodes positioned according to the 10-20 International system of electrode placement. Electrical activity was acquired at a sampling rate of '500Hz'  and reviewed with a high frequency filter of '70Hz'  and a low frequency filter  of '1Hz' . EEG data were recorded continuously and digitally stored. Description: The posterior dominant rhythm consists of 7.5 Hz activity of moderate voltage (25-35 uV) seen predominantly in posterior head regions, symmetric and reactive to eye opening and eye closing. Sleep was characterized by vertex waves, sleep spindles (12 to 14 Hz), maximal frontocentral region. EEG also showed 5-'7hz'  theta slowing in right hemisphere. Hyperventilation and photic stimulation were not performed.   ABNORMALITY - Continuous slow, right hemisphere IMPRESSION: This study is suggestive of cortical dysfunction in right hemisphere, likely due to underlying encephalomalacia. No seizures or definite epileptiform discharges were seen throughout the recording. Lora Havens   CT HEAD CODE STROKE WO CONTRAST  Result Date: 11/29/2021 CLINICAL DATA:  Code stroke.  Acute neurologic deficit EXAM: CT HEAD WITHOUT CONTRAST TECHNIQUE: Contiguous axial images were obtained from the base of the skull through the vertex without intravenous contrast. COMPARISON:  None. FINDINGS: Brain: There is no mass, hemorrhage or extra-axial collection. There is generalized atrophy without lobar predilection. There is hypoattenuation of the periventricular white matter, most commonly indicating chronic ischemic microangiopathy. Unchanged appearance of old right MCA territory infarct. Vascular: No  abnormal hyperdensity of the major intracranial arteries or dural venous sinuses. No intracranial atherosclerosis. Skull: The visualized skull base, calvarium and extracranial soft tissues are normal. Sinuses/Orbits: No fluid levels or advanced mucosal thickening of the visualized paranasal sinuses. No mastoid or middle ear effusion. The orbits are normal. ASPECTS [facility] Stroke Program Early CT Score) - Ganglionic level infarction (caudate, lentiform nuclei, internal capsule, insula, M1-M3 cortex): 7 - Supraganglionic infarction (M4-M6 cortex): 3 Total score  (0-10 with 10 being normal): 10 IMPRESSION: 1. No acute intracranial abnormality. 2. ASPECTS is 10. 3. Old right MCA territory infarct and chronic ischemic microangiopathy. These results were communicated to Dr. Donnetta Simpers at 12:37 am on 11/29/2021 by text page via the [facility] messaging system. Electronically Signed   By: Ulyses Jarred M.D.   On: 11/29/2021 00:41     Subjective: - no chest pain, shortness of breath, no abdominal pain, nausea or vomiting.   Discharge Exam: BP 137/79 (BP Location: Right Arm)    Pulse 64    Temp 98.6 F (37 C) (Oral)    Resp 16    Ht '6\' 1"'  (1.854 m)    Wt 115.8 kg    SpO2 100%    BMI 33.68 kg/m   General: Pt is alert, awake, not in acute distress Cardiovascular: RRR, S1/S2 +, no rubs, no gallops Respiratory: CTA bilaterally, no wheezing, no rhonchi Abdominal: Soft, NT, ND, bowel sounds + Extremities: no edema, no cyanosis    The results of significant diagnostics from this hospitalization (including imaging, microbiology, ancillary and laboratory) are listed below for reference.     Microbiology: Recent Results (from the past 240 hour(s))  Resp Panel by RT-PCR (Flu A&B, Covid) Nasopharyngeal Swab     Status: None   Collection Time: 11/29/21  5:34 PM   Specimen: Nasopharyngeal Swab; Nasopharyngeal(NP) swabs in vial transport medium  Result Value Ref Range Status   SARS Coronavirus 2 by RT PCR NEGATIVE NEGATIVE Final    Comment: (NOTE) SARS-CoV-2 target nucleic acids are NOT DETECTED.  The SARS-CoV-2 RNA is generally detectable in upper respiratory specimens during the acute phase of infection. The lowest concentration of SARS-CoV-2 viral copies this assay can detect is 138 copies/mL. A negative result does not preclude SARS-Cov-2 infection and should not be used as the sole basis for treatment or other patient management decisions. A negative result may occur with  improper specimen collection/handling, submission of specimen other than  nasopharyngeal swab, presence of viral mutation(s) within the areas targeted by this assay, and inadequate number of viral copies(<138 copies/mL). A negative result must be combined with clinical observations, patient history, and epidemiological information. The expected result is Negative.  Fact Sheet for Patients:  EntrepreneurPulse.com.au  Fact Sheet for Healthcare Providers:  IncredibleEmployment.be  This test is no t yet approved or cleared by the [place] FDA and  has been authorized for detection and/or diagnosis of SARS-CoV-2 by FDA under an Emergency Use Authorization (EUA). This EUA will remain  in effect (meaning this test can be used) for the duration of the COVID-19 declaration under Section 564(b)(1) of the Act, 21 U.S.C.section 360bbb-3(b)(1), unless the authorization is terminated  or revoked sooner.       Influenza A by PCR NEGATIVE NEGATIVE Final   Influenza B by PCR NEGATIVE NEGATIVE Final    Comment: (NOTE) The Xpert Xpress SARS-CoV-2/FLU/RSV plus assay is intended as an aid in the diagnosis of influenza from Nasopharyngeal swab specimens and should not be used as a sole basis  for treatment. Nasal washings and aspirates are unacceptable for Xpert Xpress SARS-CoV-2/FLU/RSV testing.  Fact Sheet for Patients: EntrepreneurPulse.com.au  Fact Sheet for Healthcare Providers: IncredibleEmployment.be  This test is not yet approved or cleared by the [place] FDA and has been authorized for detection and/or diagnosis of SARS-CoV-2 by FDA under an Emergency Use Authorization (EUA). This EUA will remain in effect (meaning this test can be used) for the duration of the COVID-19 declaration under Section 564(b)(1) of the Act, 21 U.S.C. section 360bbb-3(b)(1), unless the authorization is terminated or revoked.  Performed at [facility], [place] [address], [place], [place] [address]       Labs: Basic Metabolic Panel: Recent Labs  Lab 11/29/21 0320 11/30/21 0737 12/01/21 0842 12/02/21 0318 12/03/21 0225 12/04/21 0207  NA  --  140 141 141 141 138  K  --  3.0* 3.7 3.6 4.0 4.1  CL  --  108 113* 116* 115* 111  CO2  --  25 20* 21* 22 22  GLUCOSE  --  88 69* 61* 98 219*  BUN  --  21* 25* 25* 29* 29*  CREATININE  --  3.98* 3.81* 4.17* 4.43* 4.19*  CALCIUM  --  8.6* 8.6* 8.3* 8.1* 8.1*  MG 1.8 1.8  --   --   --   --    Liver Function Tests: Recent Labs  Lab 11/29/21 0228 11/30/21 0836  AST 18 18  ALT 15 15  ALKPHOS 136* 122  BILITOT 1.1 1.0  PROT 6.5 6.5  ALBUMIN 3.1* 2.9*   CBC: Recent Labs  Lab 11/29/21 0228 11/29/21 0229 11/30/21 0836 12/02/21 0318 12/03/21 0225  WBC 5.1  --  7.1 6.6 6.2  NEUTROABS 3.9  --  4.2  --   --   HGB 16.1 6.1* 11.8* 10.8* 9.1*  HCT 44.4 18.0* 33.5* 31.6* 27.5*  MCV 82.5  --  84.4 87.3 89.0  PLT 145*  --  242 230 226   CBG: Recent Labs  Lab 12/03/21 0654 12/03/21 1148 12/03/21 1611 12/03/21 2126 12/04/21 0614  GLUCAP 86 102* 123* 243* 140*   Hgb A1c No results for input(s): HGBA1C in the last 72 hours. Lipid Profile No results for input(s): CHOL, HDL, LDLCALC, TRIG, CHOLHDL, LDLDIRECT in the last 72 hours. Thyroid function studies No results for input(s): TSH, T4TOTAL, T3FREE, THYROIDAB in the last 72 hours.  Invalid input(s): FREET3 Urinalysis    Component Value Date/Time   COLORURINE AMBER (A) 12/02/2021 1806   APPEARANCEUR CLOUDY (A) 12/02/2021 1806   LABSPEC 1.022 12/02/2021 1806   LABSPEC 1.030 05/23/2017 1538   PHURINE 5.0 12/02/2021 1806   GLUCOSEU 50 (A) 12/02/2021 1806   HGBUR NEGATIVE 12/02/2021 1806   BILIRUBINUR NEGATIVE 12/02/2021 1806   BILIRUBINUR negative 09/18/2020 1437   BILIRUBINUR neg 11/04/2016 1619   KETONESUR NEGATIVE 12/02/2021 1806   PROTEINUR >=300 (A) 12/02/2021 1806   UROBILINOGEN 0.2 09/18/2020 1437   UROBILINOGEN 1.0 12/13/2014 1029   NITRITE NEGATIVE 12/02/2021  1806   LEUKOCYTESUR NEGATIVE 12/02/2021 1806    FURTHER DISCHARGE INSTRUCTIONS:   Get Medicines reviewed and adjusted: Please take all your medications with you for your next visit with your Primary MD   Laboratory/radiological data: Please request your Primary MD to go over all hospital tests and procedure/radiological results at the follow up, please ask your Primary MD to get all Hospital records sent to his/her office.   In some cases, they will be blood work, cultures and biopsy results pending  at the time of your discharge. Please request that your primary care M.D. goes through all the records of your hospital data and follows up on these results.   Also Note the following: If you experience worsening of your admission symptoms, develop shortness of breath, life threatening emergency, suicidal or homicidal thoughts you must seek medical attention immediately by calling 911 or calling your MD immediately  if symptoms less severe.   You must read complete instructions/literature along with all the possible adverse reactions/side effects for all the Medicines you take and that have been prescribed to you. Take any new Medicines after you have completely understood and accpet all the possible adverse reactions/side effects.    Do not drive when taking Pain medications or sleeping medications (Benzodaizepines)   Do not take more than prescribed Pain, Sleep and Anxiety Medications. It is not advisable to combine anxiety,sleep and pain medications without talking with your primary care practitioner   Special Instructions: If you have smoked or chewed Tobacco  in the last 2 yrs please stop smoking, stop any regular Alcohol  and or any Recreational drug use.   Wear Seat belts while driving.   Please note: You were cared for by a hospitalist during your hospital stay. Once you are discharged, your primary care physician will handle any further medical issues. Please note that NO REFILLS for  any discharge medications will be authorized once you are discharged, as it is imperative that you return to your primary care physician (or establish a relationship with a primary care physician if you do not have one) for your post hospital discharge needs so that they can reassess your need for medications and monitor your lab values.  Time coordinating discharge: 40 minutes  SIGNED:  Marzetta Board, MD, PhD 12/04/2021, 8:45 AM

## 2021-12-04 NOTE — TOC Transition Note (Signed)
Transition of Care [facility]) - CM/SW Discharge Note   Patient Details  Name: Brandon [name] MRN: [id number] Date of Birth: 11-Dec-1960  Transition of Care [facility]) CM/SW Contact:  Carles Collet, RN Phone Number: 12/04/2021, 9:32 AM   Clinical Narrative:    Verified w MD that plan is now for DC to home. Spoke w patient over the phone.  He confirmed plans for home DC. Discussed HH, he had no preference for agency, referral accepted by [facility]. He states that he has all needed DME at home including RW. No other TOC needs identified    Final next level of care: [place] Barriers to Discharge: No Barriers Identified   Patient Goals and CMS Choice Patient states their goals for this hospitalization and ongoing recovery are:: to go home CMS Medicare.gov Compare Post Acute Care list provided to:: Patient Choice offered to / list presented to : Patient  Discharge Placement                       Discharge Plan and Services In-house Referral: Clinical Social Work Discharge Planning Services: CM Consult Post Acute Care Choice: Skilled Nursing Facility          DME Arranged: N/A         HH Arranged: PT HH Agency: [place] Date [facility] Agency Contacted: 12/04/21 Time [place]: [id number] Representative spoke with at [place]: [place] ([place]) Interventions     Readmission Risk Interventions Readmission Risk Prevention Plan 07/09/2021  Transportation Screening Complete  PCP or Specialist Appt within 3-5 Days Complete  HRI or [place] Complete  Social Work Consult for [place] Planning/Counseling Complete  Palliative Care Screening Not Applicable  Some recent data might be hidden

## 2021-12-10 ENCOUNTER — Other Ambulatory Visit: Payer: Self-pay | Admitting: Medical

## 2022-01-16 ENCOUNTER — Other Ambulatory Visit: Payer: Self-pay | Admitting: Medical

## 2022-02-01 ENCOUNTER — Emergency Department (HOSPITAL_BASED_OUTPATIENT_CLINIC_OR_DEPARTMENT_OTHER)
Admission: EM | Admit: 2022-02-01 | Discharge: 2022-02-01 | Disposition: A | Discharge status: Home / Self Care | Payer: Medicare Other | Attending: Emergency Medicine | Admitting: Emergency Medicine

## 2022-02-01 ENCOUNTER — Telehealth: Payer: Self-pay | Admitting: Podiatry

## 2022-02-01 ENCOUNTER — Encounter: Payer: Self-pay | Admitting: Emergency Medicine

## 2022-02-01 ENCOUNTER — Emergency Department (HOSPITAL_BASED_OUTPATIENT_CLINIC_OR_DEPARTMENT_OTHER): Payer: Medicare Other

## 2022-02-01 ENCOUNTER — Other Ambulatory Visit: Payer: Self-pay

## 2022-02-01 ENCOUNTER — Ambulatory Visit
Admission: EM | Admit: 2022-02-01 | Discharge: 2022-02-01 | Disposition: A | Discharge status: Home / Self Care | Payer: Medicare Other

## 2022-02-01 ENCOUNTER — Encounter (HOSPITAL_BASED_OUTPATIENT_CLINIC_OR_DEPARTMENT_OTHER): Payer: Self-pay

## 2022-02-01 DIAGNOSIS — Z79899 Other long term (current) drug therapy: Secondary | ICD-10-CM | POA: Insufficient documentation

## 2022-02-01 DIAGNOSIS — I10 Essential (primary) hypertension: Secondary | ICD-10-CM

## 2022-02-01 DIAGNOSIS — Z794 Long term (current) use of insulin: Secondary | ICD-10-CM | POA: Insufficient documentation

## 2022-02-01 DIAGNOSIS — N184 Chronic kidney disease, stage 4 (severe): Secondary | ICD-10-CM

## 2022-02-01 DIAGNOSIS — S91202A Unspecified open wound of left great toe with damage to nail, initial encounter: Secondary | ICD-10-CM | POA: Insufficient documentation

## 2022-02-01 DIAGNOSIS — L84 Corns and callosities: Secondary | ICD-10-CM | POA: Insufficient documentation

## 2022-02-01 DIAGNOSIS — Z7982 Long term (current) use of aspirin: Secondary | ICD-10-CM | POA: Insufficient documentation

## 2022-02-01 DIAGNOSIS — X58XXXA Exposure to other specified factors, initial encounter: Secondary | ICD-10-CM | POA: Insufficient documentation

## 2022-02-01 DIAGNOSIS — E1122 Type 2 diabetes mellitus with diabetic chronic kidney disease: Secondary | ICD-10-CM | POA: Insufficient documentation

## 2022-02-01 DIAGNOSIS — R600 Localized edema: Secondary | ICD-10-CM | POA: Insufficient documentation

## 2022-02-01 DIAGNOSIS — I129 Hypertensive chronic kidney disease with stage 1 through stage 4 chronic kidney disease, or unspecified chronic kidney disease: Secondary | ICD-10-CM | POA: Insufficient documentation

## 2022-02-01 DIAGNOSIS — S91209A Unspecified open wound of unspecified toe(s) with damage to nail, initial encounter: Secondary | ICD-10-CM | POA: Diagnosis not present

## 2022-02-01 DIAGNOSIS — M7989 Other specified soft tissue disorders: Secondary | ICD-10-CM | POA: Diagnosis not present

## 2022-02-01 DIAGNOSIS — E1140 Type 2 diabetes mellitus with diabetic neuropathy, unspecified: Secondary | ICD-10-CM | POA: Diagnosis not present

## 2022-02-01 LAB — CBC WITH DIFFERENTIAL/PLATELET
Abs Immature Granulocytes: 0.01 10*3/uL (ref 0.00–0.07)
Basophils Absolute: 0 10*3/uL (ref 0.0–0.1)
Basophils Relative: 1 %
Eosinophils Absolute: 0.1 10*3/uL (ref 0.0–0.5)
Eosinophils Relative: 3 %
HCT: 27.7 % — ABNORMAL LOW (ref 39.0–52.0)
Hemoglobin: 9.8 g/dL — ABNORMAL LOW (ref 13.0–17.0)
Immature Granulocytes: 0 %
Lymphocytes Relative: 32 %
Lymphs Abs: 1.7 10*3/uL (ref 0.7–4.0)
MCH: 29.9 pg (ref 26.0–34.0)
MCHC: 35.4 g/dL (ref 30.0–36.0)
MCV: 84.5 fL (ref 80.0–100.0)
Monocytes Absolute: 0.4 10*3/uL (ref 0.1–1.0)
Monocytes Relative: 7 %
Neutro Abs: 3 10*3/uL (ref 1.7–7.7)
Neutrophils Relative %: 57 %
Platelets: 207 10*3/uL (ref 150–400)
RBC: 3.28 MIL/uL — ABNORMAL LOW (ref 4.22–5.81)
RDW: 12.5 % (ref 11.5–15.5)
WBC: 5.2 10*3/uL (ref 4.0–10.5)
nRBC: 0 % (ref 0.0–0.2)

## 2022-02-01 LAB — BASIC METABOLIC PANEL
Anion gap: 6 (ref 5–15)
BUN: 30 mg/dL — ABNORMAL HIGH (ref 6–20)
CO2: 23 mmol/L (ref 22–32)
Calcium: 8.2 mg/dL — ABNORMAL LOW (ref 8.9–10.3)
Chloride: 108 mmol/L (ref 98–111)
Creatinine, Ser: 3.99 mg/dL — ABNORMAL HIGH (ref 0.61–1.24)
GFR, Estimated: 17 mL/min — ABNORMAL LOW (ref >60)
Glucose, Bld: 308 mg/dL — ABNORMAL HIGH (ref 70–99)
Potassium: 3.1 mmol/L — ABNORMAL LOW (ref 3.5–5.1)
Sodium: 137 mmol/L (ref 135–145)

## 2022-02-01 MED ORDER — HYDRALAZINE HCL 20 MG/ML IJ SOLN
10.0000 mg | Freq: Once | INTRAMUSCULAR | Status: AC
  Administered 2022-02-01: 10 mg via INTRAMUSCULAR

## 2022-02-01 MED ORDER — HYDRALAZINE HCL 20 MG/ML IJ SOLN
10.0000 mg | INTRAMUSCULAR | Status: DC
  Filled 2022-02-01: qty 1

## 2022-02-01 MED ORDER — LIDOCAINE HCL (PF) 1 % IJ SOLN
20.0000 mL | Freq: Once | INTRAMUSCULAR | Status: DC
  Filled 2022-02-01: qty 20

## 2022-02-01 MED ORDER — TRAMADOL HCL 50 MG PO TABS: 50.0000 mg | ORAL_TABLET | Freq: Four times a day (QID) | ORAL | 0 refills | Status: DC | PRN

## 2022-02-01 MED ORDER — AMLODIPINE BESYLATE 5 MG PO TABS
10.0000 mg | ORAL_TABLET | Freq: Once | ORAL | Status: AC
  Administered 2022-02-01: 10 mg via ORAL
  Filled 2022-02-01: qty 2

## 2022-02-01 MED ORDER — CEPHALEXIN 500 MG PO CAPS: ORAL_CAPSULE | ORAL | 0 refills | Status: DC

## 2022-02-01 NOTE — ED Triage Notes (Signed)
Pt sent from UC with dx left great toe nail avulsion-pt reports injury last night-NAD-shuffling gait

## 2022-02-01 NOTE — ED Provider Notes (Signed)
[place] EMERGENCY DEPARTMENT Provider Note   CSN: [id number] Arrival date & time: 02/01/22  1430     History  Chief Complaint  Patient presents with   Nail Problem    Brandon [name] is a 61 y.o. male. With a pmh of HTN, DM who was sent from [facility] for evalutation of L great toe nail avulsion. Patient has decreased sensation and neuropathy in his feet. He has thickened nails and does not see podiatry at this time. The Patient states that sometime between last night and this morning he must have injured his left great toe nail because he woke up with a sock full of blood. He was notable  hypertensive at the [facility] and was sent in for further evaluation.   HPI     Home Medications Prior to Admission medications   Medication Sig Start Date End Date Taking? Authorizing Provider  amLODipine (NORVASC) 10 MG tablet TAKE 1 TABLET BY MOUTH EVERY DAY Patient taking differently: Take 10 mg by mouth daily. 11/10/21   Tysinger, Camelia Eng, PA-C  aspirin EC 81 MG tablet Take 81 mg by mouth daily. Swallow whole.    [provider]  atorvastatin (LIPITOR) 40 MG tablet Take 1 tablet (40 mg total) by mouth daily. 10/19/21 01/17/22  Tysinger, Camelia Eng, PA-C  blood glucose meter kit and supplies Dispense based on patient and insurance preference. Use up to four times daily as directed. (FOR ICD-9 250.00, 250.01). Patient taking differently: 1 each by Other route See admin instructions. Dispense based on patient and insurance preference. Use up to four times daily as directed. (FOR ICD-9 250.00, 250.01). 04/24/16   Eugenie Filler, MD  ezetimibe (ZETIA) 10 MG tablet Take 1 tablet (10 mg total) by mouth daily. 10/19/21 01/17/22  Tysinger, Camelia Eng, PA-C  glucose blood (ACCU-CHEK ACTIVE STRIPS) test strip Use as instructed Patient taking differently: 1 each by Other route as directed. 06/11/19   Tysinger, Camelia Eng, PA-C  glucose blood test strip accu chek active 1-2 times daily Patient  taking differently: 1 each by Other route See admin instructions. accu chek active 1-2 times daily 01/17/20   Tysinger, Camelia Eng, PA-C  hydrALAZINE (APRESOLINE) 100 MG tablet Take 1 tablet (100 mg total) by mouth every 8 (eight) hours. 09/21/21 11/29/21  Darliss Cheney, MD  insulin aspart (NOVOLOG) 100 UNIT/ML FlexPen Inject 2-10 Units into the skin 3 (three) times daily with meals. If glucose 150-200 take 2 units, glucose 201-250 take 4 units, 251-300 take 6 units, glucose 301-350 take 8 units, glucose 351-400 take 10 units 07/10/21 11/29/21  [place] Eric J, DO  insulin glargine (LANTUS) 100 UNIT/ML Solostar Pen Inject 20 Units into the skin daily. Patient taking differently: Inject 40 Units into the skin daily. 09/21/21 12/20/21  Darliss Cheney, MD  Insulin Pen Needle (NOVOFINE PLUS PEN NEEDLE) 32G X 4 MM MISC Use as directed with insulin pen 07/10/21   [place] Eric J, DO  Lancets (ACCU-CHEK SOFT TOUCH) lancets Use as instructed Patient taking differently: 1 each by Other route as directed. 06/11/19   Tysinger, Camelia Eng, PA-C  levETIRAcetam (KEPPRA) 500 MG tablet Take 2 tablets (1,000 mg total) by mouth daily. Patient taking differently: Take 500 mg by mouth 2 (two) times daily. 10/19/21 01/17/22  Tysinger, Camelia Eng, PA-C  omeprazole (PRILOSEC) 40 MG capsule Take 1 capsule (40 mg total) by mouth daily. Patient taking differently: Take 40 mg by mouth daily as needed (heartburn/indigestion). 07/10/21 11/29/21  [place] Eric  J, DO  QUEtiapine (SEROQUEL) 25 MG tablet Take 1 tablet (25 mg total) by mouth at bedtime. 10/19/21 01/17/22  Tysinger, Camelia Eng, PA-C  sertraline (ZOLOFT) 25 MG tablet Take 1 tablet (25 mg total) by mouth daily. 10/19/21 01/17/22  Tysinger, Camelia Eng, PA-C      Allergies    Morphine    Review of Systems   Review of Systems  Physical Exam Updated Vital Signs BP (!) 183/100 (BP Location: Right Arm)    Pulse 96    Temp 98.7 F (37.1 C) (Oral)    Resp 18    Ht '6\' 1"'  (1.854 m)    Wt 112.5 kg     SpO2 100%    BMI 32.72 kg/m  Physical Exam Vitals and nursing note reviewed.  Constitutional:      General: He is not in acute distress.    Appearance: He is well-developed. He is not diaphoretic.  HENT:     Head: Normocephalic and atraumatic.  Eyes:     General: No scleral icterus.    Conjunctiva/sclera: Conjunctivae normal.  Cardiovascular:     Rate and Rhythm: Normal rate and regular rhythm.     Heart sounds: Normal heart sounds.  Pulmonary:     Effort: Pulmonary effort is normal. No respiratory distress.     Breath sounds: Normal breath sounds.  Abdominal:     Palpations: Abdomen is soft.     Tenderness: There is no abdominal tenderness.  Musculoskeletal:     Cervical back: Normal range of motion and neck supple.     Right lower leg: Edema present.     Left lower leg: Edema present.  Feet:     Right foot:     Skin integrity: Callus present.     Toenail Condition: Right toenails are abnormally thick.     Left foot:     Skin integrity: Skin integrity normal.     Toenail Condition: Left toenails are abnormally thick.     Comments: Left great toe with partial nail avulsion. Skin:    General: Skin is warm and dry.  Neurological:     Mental Status: He is alert.  Psychiatric:        Behavior: Behavior normal.    ED Results / Procedures / Treatments   Labs (all labs ordered are listed, but only abnormal results are displayed) Labs Reviewed - No data to display  EKG None  Radiology No results found.  Procedures Procedures    Medications Ordered in ED Medications  lidocaine (PF) (XYLOCAINE) 1 % injection 20 mL (has no administration in time range)    ED Course/ Medical Decision Making/ A&P Clinical Course as of 02/01/22 1705  Tue Feb 01, 2022  1523 DG Foot Complete Left I visualized the patient's foot xray- no acute findings. Agree with Radiology. I have low clinical suspicion for osteomyelitis [AH]  1608 CBC with Differential(!) [AH]  1608 Hemoglobin(!):  9.8 Cbc  shows hgb at baseline for patient. [AH]  1702 Case discussed with Dr. Boneta Lucks, DPM on call for podiatry. He asks that we clean  and bandage the nail. The patient will f/u this Friday at 1:00pm [AH]  1703 Patient is markedly hypertensive but asymptomatic.  He did not take his medication today because he was afraid that it would "make me bleed more." We will give a dose of hydralazine and oral amlodipine.  Then reevaluate blood pressure [AH]  1704 Patient also noted to have worsening kidney function above his baseline  chronic kidney disease.  Patient states that he was previously referred by his primary care physician nephrology but missed the appointment and has not yet followed up.  I will rereferred the patient to [facility] given that he has stage IV CKD. [AH]    Clinical Course User Index [AH] Margarita Mail, PA-C                           Medical Decision Making 61 year old male here with avulsion injury of the nail.  Comorbidities that affect the patient's medical decision making include his history of poorly treated diabetes and hypertension.  I personally reviewed radiology images and there are no acute findings on the film.  I also reviewed the patient's labs as described in ED course.  Patient's avulsion injury I personally cleaned and bandaged.  He is to follow this coming Friday with Dr. Posey Pronto in the podiatry clinic.  I have given the patient and his caretaker management directions.  We will start the patient on Keflex.  Patient also notably has worsening in his kidney function.  Patient is advised to follow closely with [facility] and his PCP.  Blood pressure was markedly elevated here.  He did not take his blood pressure medications today.  Patient given address of hydralazine and oral amlodipine with improvement he is asymptomatic.  Amount and/or Complexity of Data Reviewed Labs: ordered. Decision-making details documented in ED  Course. Radiology: independent interpretation performed. Decision-making details documented in ED Course.  Risk Prescription drug management.    Final Clinical Impression(s) / ED Diagnoses Final diagnoses:  None    Rx / DC Orders ED Discharge Orders     None         Margarita Mail, PA-C 02/01/22 2359    Gareth Morgan, MD 02/02/22 1446

## 2022-02-01 NOTE — ED Provider Notes (Signed)
[facility]    CSN: [id number] Arrival date & time: 02/01/22  1035      History   Chief Complaint Chief Complaint  Patient presents with   Toe Injury    HPI Brandon [name] is a 61 y.o. male.   Patient here today for evaluation of left great toe injury.  He is unsure when he actually had injury but states he recently realized his nail is mostly avulsed.  He did try to clip the nail but states he thinks this did more damage.  He does have known diabetic neuropathy.  Blood pressure is remarkably elevated in office today.  He and his caregiver report he has not been taking medication as prescribed.  The history is provided by the patient.   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   left and right; cryoablation bil   Depression    BH Adm in [place] Depression   Diabetes mellitus    DKA prior hospitalization   Diverticulitis    s/p micorperforation Sept 2012-managed conservatively by Gen surgery   ED (erectile dysfunction)    Focal seizure ([place]) 11/2013   due to ischemic stroke   GERD (gastroesophageal reflux disease)    Hiatal hernia    Hyperlipidemia    Hypertension    Seizures ([place])    none since 2016, taking Keppra - maw   Sleep apnea    Thyroid disease    "weak thyroid" per MD   Wears glasses     Patient Active Problem List   Diagnosis Date Noted   Protein-calorie malnutrition, severe 12/01/2021   AKI (acute kidney injury) ([place]) 11/30/2021   AMS (altered mental status) 11/29/2021   Hypokalemia 11/29/2021   Callus 10/19/2021   Enlarged and hypertrophic nails 10/19/2021   Open wound of toe 10/19/2021   Mental health disorder 10/19/2021   Delirium 09/21/2021   Polypharmacy 09/18/2020   Anemia 04/16/2020   Anxiety 04/16/2020   Type 2 diabetes mellitus with  diabetic polyneuropathy, with long-term current use of insulin ([place]) 02/09/2020   Type 2 diabetes mellitus with stage 3a chronic kidney disease, with long-term current use of insulin ([place]) 02/09/2020   Family history of premature CAD 01/27/2020   Stage 3b chronic kidney disease ([place]) 01/09/2020   Incontinence of feces 01/09/2020   Chronic pain of left knee 01/09/2020   Fall 01/09/2020   History of diverticulitis 10/30/2019   Noncompliance 06/10/2019   History of renal cell cancer 04/24/2018   Erectile dysfunction 04/24/2018   Edema 03/15/2018   Seizure disorder ([place]) 03/01/2018   History of stroke 11/29/2017   Obstructive sleep apnea 11/29/2017   Dyslipidemia associated with type 2 diabetes mellitus ([place]) 05/10/2017   Hypertension associated with diabetes ([place]) 05/10/2017   History of cerebrovascular accident (CVA) with residual deficit 05/10/2017   Diabetic ulcer of left foot associated with secondary diabetes mellitus ([place]) 04/21/2016   Depression, recurrent ([place]) 09/21/2014    Past Surgical History:  Procedure Laterality Date   COLONOSCOPY     IR RADIOLOGIST EVAL & MGMT  04/04/2017   KIDNEY SURGERY     ablation of renal cell CA - 12/28, prior one was October [place]   TEE WITHOUT CARDIOVERSION N/A 11/19/2013   Procedure: TRANSESOPHAGEAL ECHOCARDIOGRAM (TEE);  Surgeon: Lelon Perla, MD;  Location: [facility] ENDOSCOPY;  Service: Cardiovascular;  Laterality: N/A;       Home Medications    Prior to Admission medications   Medication Sig Start Date End Date Taking? Authorizing Provider  amLODipine (NORVASC) 10 MG tablet TAKE 1 TABLET BY MOUTH EVERY DAY Patient taking differently: Take 10 mg by mouth daily. 11/10/21   Tysinger, Camelia Eng, PA-C  aspirin EC 81 MG tablet Take 81 mg by mouth daily. Swallow whole.    [provider]  atorvastatin (LIPITOR) 40 MG tablet Take 1 tablet (40 mg total) by mouth daily. 10/19/21 01/17/22  Tysinger, Camelia Eng, PA-C  blood glucose  meter kit and supplies Dispense based on patient and insurance preference. Use up to four times daily as directed. (FOR ICD-9 250.00, 250.01). Patient taking differently: 1 each by Other route See admin instructions. Dispense based on patient and insurance preference. Use up to four times daily as directed. (FOR ICD-9 250.00, 250.01). 04/24/16   Eugenie Filler, MD  ezetimibe (ZETIA) 10 MG tablet Take 1 tablet (10 mg total) by mouth daily. 10/19/21 01/17/22  Tysinger, Camelia Eng, PA-C  glucose blood (ACCU-CHEK ACTIVE STRIPS) test strip Use as instructed Patient taking differently: 1 each by Other route as directed. 06/11/19   Tysinger, Camelia Eng, PA-C  glucose blood test strip accu chek active 1-2 times daily Patient taking differently: 1 each by Other route See admin instructions. accu chek active 1-2 times daily 01/17/20   Tysinger, Camelia Eng, PA-C  hydrALAZINE (APRESOLINE) 100 MG tablet Take 1 tablet (100 mg total) by mouth every 8 (eight) hours. 09/21/21 11/29/21  Darliss Cheney, MD  insulin aspart (NOVOLOG) 100 UNIT/ML FlexPen Inject 2-10 Units into the skin 3 (three) times daily with meals. If glucose 150-200 take 2 units, glucose 201-250 take 4 units, 251-300 take 6 units, glucose 301-350 take 8 units, glucose 351-400 take 10 units 07/10/21 11/29/21  [place] Eric J, DO  insulin glargine (LANTUS) 100 UNIT/ML Solostar Pen Inject 20 Units into the skin daily. Patient taking differently: Inject 40 Units into the skin daily. 09/21/21 12/20/21  Darliss Cheney, MD  Insulin Pen Needle (NOVOFINE PLUS PEN NEEDLE) 32G X 4 MM MISC Use as directed with insulin pen 07/10/21   [place] Eric J, DO  Lancets (ACCU-CHEK SOFT TOUCH) lancets Use as instructed Patient taking differently: 1 each by Other route as directed. 06/11/19   Tysinger, Camelia Eng, PA-C  levETIRAcetam (KEPPRA) 500 MG tablet Take 2 tablets (1,000 mg total) by mouth daily. Patient taking differently: Take 500 mg by mouth 2 (two) times daily. 10/19/21 01/17/22   Tysinger, Camelia Eng, PA-C  omeprazole (PRILOSEC) 40 MG capsule Take 1 capsule (40 mg total) by mouth daily. Patient taking differently: Take 40 mg by mouth daily as needed (heartburn/indigestion). 07/10/21 11/29/21  [place] Donnamarie Poag, DO  QUEtiapine (SEROQUEL) 25 MG tablet Take 1 tablet (25 mg total) by mouth at bedtime. 10/19/21 01/17/22  Tysinger, Camelia Eng, PA-C  sertraline (ZOLOFT) 25 MG tablet Take 1 tablet (25 mg total) by mouth daily. 10/19/21 01/17/22  Tysinger, Camelia Eng, PA-C    Family History Family History  Problem Relation Age of Onset   Diabetes Father    Heart disease Father    Pneumonia Mother    Prostate cancer Other    Stomach cancer Other    Breast cancer Sister    Colon cancer Neg Hx    Esophageal cancer Neg Hx    Rectal cancer Neg Hx  Social History Social History   Tobacco Use   Smoking status: Never   Smokeless tobacco: Never  Vaping Use   Vaping Use: Never used  Substance Use Topics   Alcohol use: Not Currently    Comment: rarely   Drug use: No     Allergies   Morphine   Review of Systems Review of Systems  Constitutional:  Negative for chills and fever.  Eyes:  Negative for discharge and redness.  Respiratory:  Negative for shortness of breath.   Gastrointestinal:  Negative for nausea and vomiting.  Skin:  Positive for wound.  Neurological:  Positive for numbness.    Physical Exam Triage Vital Signs ED Triage Vitals [02/01/22 1241]  Enc Vitals Group     BP (!) 205/115     Pulse Rate 89     Resp 18     Temp 98 F (36.7 C)     Temp Source Oral     SpO2 95 %     Weight      Height      Head Circumference      Peak Flow      Pain Score 0     Pain Loc      Pain Edu?      Excl. in [place]?    No data found.  Updated Vital Signs BP (!) 205/115 (BP Location: Left Arm)    Pulse 89    Temp 98 F (36.7 C) (Oral)    Resp 18    SpO2 95%       Physical Exam Vitals and nursing note reviewed.  Constitutional:      General: He is not in acute  distress.    Appearance: Normal appearance. He is not ill-appearing.  HENT:     Head: Normocephalic and atraumatic.  Eyes:     Conjunctiva/sclera: Conjunctivae normal.  Cardiovascular:     Rate and Rhythm: Normal rate.  Pulmonary:     Effort: Pulmonary effort is normal.  Skin:    Comments: Left great toenail appears mostly avulsed with dried blood noted below nail  Neurological:     Mental Status: He is alert.  Psychiatric:        Mood and Affect: Mood normal.        Behavior: Behavior normal.        Thought Content: Thought content normal.     UC Treatments / Results  Labs (all labs ordered are listed, but only abnormal results are displayed) Labs Reviewed - No data to display  EKG   Radiology No results found.  Procedures Procedures (including critical care time)  Medications Ordered in UC Medications - No data to display  Initial Impression / Assessment and Plan / UC Course  I have reviewed the triage vital signs and the nursing notes.  Pertinent labs & imaging results that were available during my care of the patient were reviewed by me and considered in my medical decision making (see chart for details).    Given multiple chronic co-morbidities, unknown duration of symptoms, and elevated pressure recommended further evaluation and treatment in the ED as I suspect patient will need labs and possibly imaging. Patient is agreeable to same. Caregiver to transport via POV.   Final Clinical Impressions(s) / UC Diagnoses   Final diagnoses:  Avulsion of toenail, initial encounter     Discharge Instructions       Please report to [facility]  [address] [place],  [place]      ED Prescriptions   None    PDMP not reviewed this encounter.   Francene Finders, PA-C 02/01/22 1325

## 2022-02-01 NOTE — Telephone Encounter (Signed)
Received call from Ian Bushman the Pa at [facility] about pt. He is diabetic and has very thick nails, great toe has partial evulsion and she is asking for you to call her with guidance please.  Her # is [phone]

## 2022-02-01 NOTE — ED Triage Notes (Signed)
Pt here for left great toe injury; pt unsure when injured but part of toe nail is pulled off

## 2022-02-01 NOTE — ED Notes (Signed)
ED Provider at bedside. 

## 2022-02-01 NOTE — Discharge Instructions (Signed)
°  Please report to [facility]  [address] [place], [place]

## 2022-02-01 NOTE — Discharge Instructions (Addendum)
Keep your bandage on for 24 hours. Make sure to keep your bandage clean and dry. After 24 hours you may remove and re-apply your bandage after gently cleaning the toe with mild soap and water.  Follow-up this coming Friday at 1245 for a 1:00 appointment with Dr. Posey Pronto. Take your antibiotic as directed.  Take your pain medication as directed. You have increased redness, swelling, or pain around your wound. You have fluid or blood coming from your wound. You have pus or a bad smell coming from your wound. Your wound or the area around your wound feels warm to the touch. Get help right away if: You have bleeding that does not stop, even when you apply pressure to the wound. You have a temperature that is higher than 100.86F (38C). The affected finger or toe looks white or black.

## 2022-02-04 ENCOUNTER — Ambulatory Visit: Payer: Medicare Other | Admitting: Podiatry

## 2022-03-03 ENCOUNTER — Telehealth: Payer: Self-pay

## 2022-03-03 NOTE — Telephone Encounter (Signed)
Brandon [name] called to schedule an appt for pt, they had not gotten the dismissal letter, she was very surprized and upset when I told her & she wanted to you to know that [name] really liked you and this office.  She would like to know if you have any recommendations of where she could take him that would be able to take care of all his issues?

## 2022-03-04 ENCOUNTER — Ambulatory Visit
Admission: RE | Admit: 2022-03-04 | Discharge: 2022-03-04 | Disposition: A | Discharge status: Home / Self Care | Payer: Medicare Other | Source: Ambulatory Visit | Attending: Physician Assistant | Admitting: Physician Assistant

## 2022-03-04 ENCOUNTER — Ambulatory Visit (INDEPENDENT_AMBULATORY_CARE_PROVIDER_SITE_OTHER): Payer: Medicare Other

## 2022-03-04 VITALS — BP 166/82 | HR 87 | Temp 98.6°F | Resp 18

## 2022-03-04 DIAGNOSIS — M19071 Primary osteoarthritis, right ankle and foot: Secondary | ICD-10-CM | POA: Diagnosis not present

## 2022-03-04 DIAGNOSIS — M79671 Pain in right foot: Secondary | ICD-10-CM

## 2022-03-04 DIAGNOSIS — S90811A Abrasion, right foot, initial encounter: Secondary | ICD-10-CM | POA: Diagnosis not present

## 2022-03-04 DIAGNOSIS — S99921A Unspecified injury of right foot, initial encounter: Secondary | ICD-10-CM | POA: Diagnosis not present

## 2022-03-04 DIAGNOSIS — M7989 Other specified soft tissue disorders: Secondary | ICD-10-CM | POA: Diagnosis not present

## 2022-03-04 MED ORDER — DOXYCYCLINE HYCLATE 100 MG PO CAPS: 100.0000 mg | ORAL_CAPSULE | Freq: Two times a day (BID) | ORAL | 0 refills | Status: DC

## 2022-03-04 NOTE — Telephone Encounter (Signed)
[facility] is not taking new patients, Brandon [name] is currently [phone] & also Primary Care at [facility]  Called pt and no answer, unable to leave message.

## 2022-03-04 NOTE — ED Triage Notes (Signed)
Pt presents with new right foot injury after hitting it against unknown object a few days ago and getting a skin abrasion. ?

## 2022-03-04 NOTE — ED Provider Notes (Signed)
?[facility] ? ? ? ?CSN: [id number] ?Arrival date & time: 03/04/22  1302 ? ? ?  ? ?History   ?Chief Complaint ?Chief Complaint  ?Patient presents with  ? Foot Injury  ?  Entered by patient  ? ? ?HPI ?Brandon [name] is a 61 y.o. male.  ? ?Patient here today for evaluation of right foot injury that occurred a few days ago. He is not sure what he hit his foot on but has a wound to the sole of his right great toe. He has known diabetes at baseline and has had diabetic ulcer at times. He has not had any known fever.  ? ?The history is provided by the patient.  ?Foot Injury ?Associated symptoms: no fever   ? ?Past Medical History:  ?Diagnosis Date  ? Acute ischemic stroke ([place]) 11/2013  ? Allergy   ? Anxiety   ? Arthritis   ? Benign hypertension with CKD (chronic kidney disease) stage III (HCC)   ? 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 left and right; cryoablation bil  ? Depression   ? [facility] in [place] Depression  ? Diabetes mellitus   ? DKA prior hospitalization  ? Diverticulitis   ? s/p micorperforation Sept 2012-managed conservatively by Gen surgery  ? ED (erectile dysfunction)   ? Focal seizure ([place]) 11/2013  ? due to ischemic stroke  ? GERD (gastroesophageal reflux disease)   ? Hiatal hernia   ? Hyperlipidemia   ? Hypertension   ? Seizures ([place])   ? none since 2016, taking Keppra - maw  ? Sleep apnea   ? Thyroid disease   ? "weak thyroid" per MD  ? Wears glasses   ? ? ?Patient Active Problem List  ? Diagnosis Date Noted  ? Protein-calorie malnutrition, severe 12/01/2021  ? AKI (acute kidney injury) ([place]) 11/30/2021  ? AMS (altered mental status) 11/29/2021  ? Hypokalemia 11/29/2021  ? Callus 10/19/2021  ? Enlarged and hypertrophic nails 10/19/2021  ? Open wound of toe 10/19/2021  ? Mental health disorder 10/19/2021  ? Delirium 09/21/2021  ? Polypharmacy 09/18/2020  ? Anemia 04/16/2020  ? Anxiety 04/16/2020  ? Type 2 diabetes mellitus with diabetic polyneuropathy, with long-term current use of  insulin ([place]) 02/09/2020  ? Type 2 diabetes mellitus with stage 3a chronic kidney disease, with long-term current use of insulin ([place]) 02/09/2020  ? Family history of premature CAD 01/27/2020  ? Stage 3b chronic kidney disease ([place]) 01/09/2020  ? Incontinence of feces 01/09/2020  ? Chronic pain of left knee 01/09/2020  ? Fall 01/09/2020  ? History of diverticulitis 10/30/2019  ? Noncompliance 06/10/2019  ? History of renal cell cancer 04/24/2018  ? Erectile dysfunction 04/24/2018  ? Edema 03/15/2018  ? Seizure disorder ([place]) 03/01/2018  ? History of stroke 11/29/2017  ? Obstructive sleep apnea 11/29/2017  ? Dyslipidemia associated with type 2 diabetes mellitus ([place]) 05/10/2017  ? Hypertension associated with diabetes ([place]) 05/10/2017  ? History of cerebrovascular accident (CVA) with residual deficit 05/10/2017  ? Diabetic ulcer of left foot associated with secondary diabetes mellitus ([place]) 04/21/2016  ? Depression, recurrent ([place]) 09/21/2014  ? ? ?Past Surgical History:  ?Procedure Laterality Date  ? COLONOSCOPY    ? IR RADIOLOGIST EVAL & MGMT  04/04/2017  ? KIDNEY SURGERY    ? ablation of renal cell CA - 12/28, prior one was October 2012-Dr. Kathlene Cote  ? TEE WITHOUT CARDIOVERSION N/A 11/19/2013  ? Procedure: TRANSESOPHAGEAL ECHOCARDIOGRAM (TEE);  Surgeon: Denice Bors  Stanford Breed, MD;  Location: [facility];  Service: Cardiovascular;  Laterality: N/A;  ? ? ? ? ? ?Home Medications   ? ?Prior to Admission medications   ?Medication Sig Start Date End Date Taking? Authorizing Provider  ?doxycycline (VIBRAMYCIN) 100 MG capsule Take 1 capsule (100 mg total) by mouth 2 (two) times daily. 03/04/22  Yes Francene Finders, PA-C  ?amLODipine (NORVASC) 10 MG tablet TAKE 1 TABLET BY MOUTH EVERY DAY ?Patient taking differently: Take 10 mg by mouth daily. 11/10/21   Tysinger, Camelia Eng, PA-C  ?aspirin EC 81 MG tablet Take 81 mg by mouth daily. Swallow whole.    [provider]  ?atorvastatin (LIPITOR) 40 MG tablet Take 1 tablet (40 mg  total) by mouth daily. 10/19/21 01/17/22  Tysinger, Camelia Eng, PA-C  ?blood glucose meter kit and supplies Dispense based on patient and insurance preference. Use up to four times daily as directed. (FOR ICD-9 250.00, 250.01). ?Patient taking differently: 1 each by Other route See admin instructions. Dispense based on patient and insurance preference. Use up to four times daily as directed. (FOR ICD-9 250.00, 250.01). 04/24/16   Eugenie Filler, MD  ?cephALEXin [facility]) 500 MG capsule 2 caps po bid x 7 days 02/01/22   Margarita Mail, PA-C  ?ezetimibe (ZETIA) 10 MG tablet Take 1 tablet (10 mg total) by mouth daily. 10/19/21 01/17/22  Tysinger, Camelia Eng, PA-C  ?glucose blood (ACCU-CHEK ACTIVE STRIPS) test strip Use as instructed ?Patient taking differently: 1 each by Other route as directed. 06/11/19   Tysinger, Camelia Eng, PA-C  ?glucose blood test strip accu chek active 1-2 times daily ?Patient taking differently: 1 each by Other route See admin instructions. accu chek active 1-2 times daily 01/17/20   Tysinger, Camelia Eng, PA-C  ?hydrALAZINE (APRESOLINE) 100 MG tablet Take 1 tablet (100 mg total) by mouth every 8 (eight) hours. 09/21/21 11/29/21  Darliss Cheney, MD  ?insulin aspart (NOVOLOG) 100 UNIT/ML FlexPen Inject 2-10 Units into the skin 3 (three) times daily with meals. If glucose 150-200 take 2 units, glucose 201-250 take 4 units, 251-300 take 6 units, glucose 301-350 take 8 units, glucose 351-400 take 10 units 07/10/21 11/29/21  [place] Donnamarie Poag, DO  ?insulin glargine (LANTUS) 100 UNIT/ML Solostar Pen Inject 20 Units into the skin daily. ?Patient taking differently: Inject 40 Units into the skin daily. 09/21/21 12/20/21  Darliss Cheney, MD  ?Insulin Pen Needle (NOVOFINE PLUS PEN NEEDLE) 32G X 4 MM MISC Use as directed with insulin pen 07/10/21   [place] Donnamarie Poag, DO  ?Lancets (ACCU-CHEK SOFT TOUCH) lancets Use as instructed ?Patient taking differently: 1 each by Other route as directed. 06/11/19   Tysinger, Camelia Eng, PA-C   ?levETIRAcetam (KEPPRA) 500 MG tablet Take 2 tablets (1,000 mg total) by mouth daily. ?Patient taking differently: Take 500 mg by mouth 2 (two) times daily. 10/19/21 01/17/22  Tysinger, Camelia Eng, PA-C  ?omeprazole (PRILOSEC) 40 MG capsule Take 1 capsule (40 mg total) by mouth daily. ?Patient taking differently: Take 40 mg by mouth daily as needed (heartburn/indigestion). 07/10/21 11/29/21  [place] Eric J, DO  ?QUEtiapine (SEROQUEL) 25 MG tablet Take 1 tablet (25 mg total) by mouth at bedtime. 10/19/21 01/17/22  Tysinger, Camelia Eng, PA-C  ?sertraline (ZOLOFT) 25 MG tablet Take 1 tablet (25 mg total) by mouth daily. 10/19/21 01/17/22  Tysinger, Camelia Eng, PA-C  ?traMADol (ULTRAM) 50 MG tablet Take 1 tablet (50 mg total) by mouth every 6 (six) hours as needed. 02/01/22   Margarita Mail, PA-C  ? ? ?  Family History ?Family History  ?Problem Relation Age of Onset  ? Diabetes Father   ? Heart disease Father   ? Pneumonia Mother   ? Prostate cancer Other   ? Stomach cancer Other   ? Breast cancer Sister   ? Colon cancer Neg Hx   ? Esophageal cancer Neg Hx   ? Rectal cancer Neg Hx   ? ? ?Social History ?Social History  ? ?Tobacco Use  ? Smoking status: Never  ? Smokeless tobacco: Never  ?Vaping Use  ? Vaping Use: Never used  ?Substance Use Topics  ? Alcohol use: Not Currently  ?  Comment: rarely  ? Drug use: No  ? ? ? ?Allergies   ?Morphine ? ? ?Review of Systems ?Review of Systems  ?Constitutional:  Negative for chills and fever.  ?Eyes:  Negative for discharge and redness.  ?Musculoskeletal:  Negative for joint swelling.  ?     Diffuse swelling of right foot  ?Skin:  Positive for color change and wound.  ?Neurological:  Positive for numbness.  ? ? ?Physical Exam ?Triage Vital Signs ?ED Triage Vitals  ?Enc Vitals Group  ?   BP   ?   Pulse   ?   Resp   ?   Temp   ?   Temp src   ?   SpO2   ?   Weight   ?   Height   ?   Head Circumference   ?   Peak Flow   ?   Pain Score   ?   Pain Loc   ?   Pain Edu?   ?   Excl. in [place]?   ? ?No data  found. ? ?Updated Vital Signs ?BP (!) 166/82 (BP Location: Left Arm)   Pulse 87   Temp 98.6 ?F (37 ?C) (Oral)   Resp 18   SpO2 98%  ?   ? ?Physical Exam ?Vitals and nursing note reviewed.  ?Constitutional:   ?   Gene

## 2022-03-11 NOTE — Telephone Encounter (Signed)
Pt has appt at [facility]

## 2022-03-18 ENCOUNTER — Encounter (HOSPITAL_COMMUNITY): Payer: Self-pay

## 2022-03-18 ENCOUNTER — Other Ambulatory Visit: Payer: Self-pay

## 2022-03-18 ENCOUNTER — Emergency Department (HOSPITAL_COMMUNITY)
Admission: EM | Admit: 2022-03-18 | Discharge: 2022-03-18 | Disposition: A | Discharge status: Home / Self Care | Payer: Medicare Other | Attending: Emergency Medicine | Admitting: Emergency Medicine

## 2022-03-18 ENCOUNTER — Emergency Department (HOSPITAL_COMMUNITY): Payer: Medicare Other

## 2022-03-18 DIAGNOSIS — R4182 Altered mental status, unspecified: Secondary | ICD-10-CM | POA: Insufficient documentation

## 2022-03-18 DIAGNOSIS — E162 Hypoglycemia, unspecified: Secondary | ICD-10-CM

## 2022-03-18 DIAGNOSIS — Z794 Long term (current) use of insulin: Secondary | ICD-10-CM | POA: Diagnosis not present

## 2022-03-18 DIAGNOSIS — Z79899 Other long term (current) drug therapy: Secondary | ICD-10-CM | POA: Diagnosis not present

## 2022-03-18 DIAGNOSIS — I10 Essential (primary) hypertension: Secondary | ICD-10-CM | POA: Diagnosis not present

## 2022-03-18 DIAGNOSIS — Z7982 Long term (current) use of aspirin: Secondary | ICD-10-CM | POA: Insufficient documentation

## 2022-03-18 DIAGNOSIS — R531 Weakness: Secondary | ICD-10-CM | POA: Diagnosis not present

## 2022-03-18 DIAGNOSIS — E11649 Type 2 diabetes mellitus with hypoglycemia without coma: Secondary | ICD-10-CM | POA: Diagnosis not present

## 2022-03-18 DIAGNOSIS — E161 Other hypoglycemia: Secondary | ICD-10-CM | POA: Diagnosis not present

## 2022-03-18 LAB — CBC WITH DIFFERENTIAL/PLATELET
Abs Immature Granulocytes: 0.01 10*3/uL (ref 0.00–0.07)
Basophils Absolute: 0.1 10*3/uL (ref 0.0–0.1)
Basophils Relative: 1 %
Eosinophils Absolute: 0.1 10*3/uL (ref 0.0–0.5)
Eosinophils Relative: 1 %
HCT: 30.1 % — ABNORMAL LOW (ref 39.0–52.0)
Hemoglobin: 9.9 g/dL — ABNORMAL LOW (ref 13.0–17.0)
Immature Granulocytes: 0 %
Lymphocytes Relative: 19 %
Lymphs Abs: 1 10*3/uL (ref 0.7–4.0)
MCH: 29.6 pg (ref 26.0–34.0)
MCHC: 32.9 g/dL (ref 30.0–36.0)
MCV: 90.1 fL (ref 80.0–100.0)
Monocytes Absolute: 0.3 10*3/uL (ref 0.1–1.0)
Monocytes Relative: 5 %
Neutro Abs: 3.9 10*3/uL (ref 1.7–7.7)
Neutrophils Relative %: 74 %
Platelets: 204 10*3/uL (ref 150–400)
RBC: 3.34 MIL/uL — ABNORMAL LOW (ref 4.22–5.81)
RDW: 13 % (ref 11.5–15.5)
WBC: 5.2 10*3/uL (ref 4.0–10.5)
nRBC: 0 % (ref 0.0–0.2)

## 2022-03-18 LAB — URINALYSIS, ROUTINE W REFLEX MICROSCOPIC
Bilirubin Urine: NEGATIVE
Glucose, UA: NEGATIVE mg/dL
Hgb urine dipstick: NEGATIVE
Ketones, ur: NEGATIVE mg/dL
Leukocytes,Ua: NEGATIVE
Nitrite: NEGATIVE
Protein, ur: >=300 mg/dL — AB
Specific Gravity, Urine: 1.014 (ref 1.005–1.030)
pH: 5 (ref 5.0–8.0)

## 2022-03-18 LAB — COMPREHENSIVE METABOLIC PANEL
ALT: 13 U/L (ref 0–44)
AST: 21 U/L (ref 15–41)
Albumin: 3.5 g/dL (ref 3.5–5.0)
Alkaline Phosphatase: 95 U/L (ref 38–126)
Anion gap: 6 (ref 5–15)
BUN: 49 mg/dL — ABNORMAL HIGH (ref 6–20)
CO2: 21 mmol/L — ABNORMAL LOW (ref 22–32)
Calcium: 8.4 mg/dL — ABNORMAL LOW (ref 8.9–10.3)
Chloride: 113 mmol/L — ABNORMAL HIGH (ref 98–111)
Creatinine, Ser: 4.6 mg/dL — ABNORMAL HIGH (ref 0.61–1.24)
GFR, Estimated: 14 mL/min — ABNORMAL LOW (ref >60)
Glucose, Bld: 148 mg/dL — ABNORMAL HIGH (ref 70–99)
Potassium: 4.1 mmol/L (ref 3.5–5.1)
Sodium: 140 mmol/L (ref 135–145)
Total Bilirubin: 0.6 mg/dL (ref 0.3–1.2)
Total Protein: 7.2 g/dL (ref 6.5–8.1)

## 2022-03-18 LAB — BLOOD GAS, VENOUS
Acid-base deficit: 3.8 mmol/L — ABNORMAL HIGH (ref 0.0–2.0)
Bicarbonate: 22.2 mmol/L (ref 20.0–28.0)
O2 Saturation: 92.6 %
Patient temperature: 37
pCO2, Ven: 43 mmHg — ABNORMAL LOW (ref 44–60)
pH, Ven: 7.32 (ref 7.25–7.43)
pO2, Ven: 60 mmHg — ABNORMAL HIGH (ref 32–45)

## 2022-03-18 LAB — AMMONIA: Ammonia: 26 umol/L (ref 9–35)

## 2022-03-18 LAB — LIPASE, BLOOD: Lipase: 50 U/L (ref 11–51)

## 2022-03-18 LAB — CBG MONITORING, ED
Glucose-Capillary: 46 mg/dL — ABNORMAL LOW (ref 70–99)
Glucose-Capillary: 79 mg/dL (ref 70–99)
Glucose-Capillary: 62 mg/dL — ABNORMAL LOW (ref 70–99)
Glucose-Capillary: 63 mg/dL — ABNORMAL LOW (ref 70–99)

## 2022-03-18 MED ORDER — DEXTROSE 50 % IV SOLN
INTRAVENOUS | Status: AC
  Filled 2022-03-18: qty 50

## 2022-03-18 MED ORDER — AMLODIPINE BESYLATE 5 MG PO TABS
10.0000 mg | ORAL_TABLET | Freq: Once | ORAL | Status: AC
  Administered 2022-03-18: 10 mg via ORAL
  Filled 2022-03-18: qty 2

## 2022-03-18 MED ORDER — HYDRALAZINE HCL 20 MG/ML IJ SOLN
10.0000 mg | Freq: Once | INTRAMUSCULAR | Status: AC
  Administered 2022-03-18: 10 mg via INTRAVENOUS
  Filled 2022-03-18: qty 1

## 2022-03-18 MED ORDER — HYDROCODONE-ACETAMINOPHEN 5-325 MG PO TABS
1.0000 | ORAL_TABLET | Freq: Four times a day (QID) | ORAL | 0 refills | Status: AC | PRN
Start: 2022-03-18 — End: ?

## 2022-03-18 NOTE — ED Provider Notes (Signed)
?Brandon [name] ?Provider Note ? ? ?CSN: [id number] ?Arrival date & time: 03/18/22  0857 ? ?  ? ?History ? ?Chief Complaint  ?Patient presents with  ? Hypoglycemia  ? ? ?Brandon [name] is a 61 y.o. male. ? ?HPI ? ?61 year old male with past medical history of insulin dependent diabetes, HTN presents emergency department after an episode of altered mental status.  Report from EMS is that the wife found the patient sitting up in his chair in a deep sleep, had a difficult time waking him.  When EMS arrived his CBG was 36.  He got dextrose and woke up.  The wife states it is not unusual for the patient to sleep in his chair.  The patient believes he remembers going to bed last night and at 1 point playing with his puppy but cannot recall specific events of the morning.  Does not know if he took his insulin on an empty stomach, did not eat breakfast.  No recent fever or acute illness.  On arrival CBG had increased and he has no active complaints besides mild fatigue. ? ?Home Medications ?Prior to Admission medications   ?Medication Sig Start Date End Date Taking? Authorizing Provider  ?amLODipine (NORVASC) 10 MG tablet TAKE 1 TABLET BY MOUTH EVERY DAY ?Patient taking differently: Take 10 mg by mouth daily. 11/10/21   Tysinger, Camelia Eng, PA-C  ?aspirin EC 81 MG tablet Take 81 mg by mouth daily. Swallow whole.    [provider]  ?atorvastatin (LIPITOR) 40 MG tablet Take 1 tablet (40 mg total) by mouth daily. 10/19/21 01/17/22  Tysinger, Camelia Eng, PA-C  ?blood glucose meter kit and supplies Dispense based on patient and insurance preference. Use up to four times daily as directed. (FOR ICD-9 250.00, 250.01). ?Patient taking differently: 1 each by Other route See admin instructions. Dispense based on patient and insurance preference. Use up to four times daily as directed. (FOR ICD-9 250.00, 250.01). 04/24/16   Eugenie Filler, MD  ?cephALEXin [facility]) 500 MG capsule 2 caps po bid x 7  days 02/01/22   Margarita Mail, PA-C  ?doxycycline (VIBRAMYCIN) 100 MG capsule Take 1 capsule (100 mg total) by mouth 2 (two) times daily. 03/04/22   Francene Finders, PA-C  ?ezetimibe (ZETIA) 10 MG tablet Take 1 tablet (10 mg total) by mouth daily. 10/19/21 01/17/22  Tysinger, Camelia Eng, PA-C  ?glucose blood (ACCU-CHEK ACTIVE STRIPS) test strip Use as instructed ?Patient taking differently: 1 each by Other route as directed. 06/11/19   Tysinger, Camelia Eng, PA-C  ?glucose blood test strip accu chek active 1-2 times daily ?Patient taking differently: 1 each by Other route See admin instructions. accu chek active 1-2 times daily 01/17/20   Tysinger, Camelia Eng, PA-C  ?hydrALAZINE (APRESOLINE) 100 MG tablet Take 1 tablet (100 mg total) by mouth every 8 (eight) hours. 09/21/21 11/29/21  Darliss Cheney, MD  ?insulin aspart (NOVOLOG) 100 UNIT/ML FlexPen Inject 2-10 Units into the skin 3 (three) times daily with meals. If glucose 150-200 take 2 units, glucose 201-250 take 4 units, 251-300 take 6 units, glucose 301-350 take 8 units, glucose 351-400 take 10 units 07/10/21 11/29/21  [place] Donnamarie Poag, DO  ?insulin glargine (LANTUS) 100 UNIT/ML Solostar Pen Inject 20 Units into the skin daily. ?Patient taking differently: Inject 40 Units into the skin daily. 09/21/21 12/20/21  Darliss Cheney, MD  ?Insulin Pen Needle (NOVOFINE PLUS PEN NEEDLE) 32G X 4 MM MISC Use as directed with insulin pen 07/10/21  [place] Eric J, DO  ?Lancets (ACCU-CHEK SOFT TOUCH) lancets Use as instructed ?Patient taking differently: 1 each by Other route as directed. 06/11/19   Tysinger, Camelia Eng, PA-C  ?levETIRAcetam (KEPPRA) 500 MG tablet Take 2 tablets (1,000 mg total) by mouth daily. ?Patient taking differently: Take 500 mg by mouth 2 (two) times daily. 10/19/21 01/17/22  Tysinger, Camelia Eng, PA-C  ?omeprazole (PRILOSEC) 40 MG capsule Take 1 capsule (40 mg total) by mouth daily. ?Patient taking differently: Take 40 mg by mouth daily as needed (heartburn/indigestion). 07/10/21  11/29/21  [place] Eric J, DO  ?QUEtiapine (SEROQUEL) 25 MG tablet Take 1 tablet (25 mg total) by mouth at bedtime. 10/19/21 01/17/22  Tysinger, Camelia Eng, PA-C  ?sertraline (ZOLOFT) 25 MG tablet Take 1 tablet (25 mg total) by mouth daily. 10/19/21 01/17/22  Tysinger, Camelia Eng, PA-C  ?traMADol (ULTRAM) 50 MG tablet Take 1 tablet (50 mg total) by mouth every 6 (six) hours as needed. 02/01/22   Margarita Mail, PA-C  ?   ? ?Allergies    ?Morphine   ? ?Review of Systems   ?Review of Systems  ?Constitutional:  Negative for fever.  ?Respiratory:  Negative for cough and shortness of breath.   ?Cardiovascular:  Negative for chest pain.  ?Gastrointestinal:  Negative for abdominal pain, diarrhea, nausea and vomiting.  ?Psychiatric/Behavioral:  Positive for confusion.   ? ?Physical Exam ?Updated Vital Signs ?BP (!) 186/112   Pulse 81   Resp 18   SpO2 100%  ?Physical Exam ?Vitals and nursing note reviewed.  ?Constitutional:   ?   General: He is not in acute distress. ?   Appearance: Normal appearance. He is not diaphoretic.  ?HENT:  ?   Head: Normocephalic.  ?   Mouth/Throat:  ?   Mouth: Mucous membranes are moist.  ?Eyes:  ?   Pupils: Pupils are equal, round, and reactive to light.  ?Cardiovascular:  ?   Rate and Rhythm: Normal rate.  ?Pulmonary:  ?   Effort: Pulmonary effort is normal. No respiratory distress.  ?Abdominal:  ?   Palpations: Abdomen is soft.  ?   Tenderness: There is no abdominal tenderness.  ?Musculoskeletal:  ?   Cervical back: No rigidity.  ?Skin: ?   General: Skin is warm.  ?Neurological:  ?   General: No focal deficit present.  ?   Mental Status: He is alert and oriented to person, place, and time. Mental status is at baseline.  ?Psychiatric:     ?   Mood and Affect: Mood normal.  ? ? ?ED Results / Procedures / Treatments   ?Labs ?(all labs ordered are listed, but only abnormal results are displayed) ?Labs Reviewed  ?CBC WITH DIFFERENTIAL/PLATELET - Abnormal; Notable for the following components:  ?     Result Value  ? RBC 3.34 (*)   ? Hemoglobin 9.9 (*)   ? HCT 30.1 (*)   ? All other components within normal limits  ?COMPREHENSIVE METABOLIC PANEL - Abnormal; Notable for the following components:  ? Chloride 113 (*)   ? CO2 21 (*)   ? Glucose, Bld 148 (*)   ? BUN 49 (*)   ? Creatinine, Ser 4.60 (*)   ? Calcium 8.4 (*)   ? GFR, Estimated 14 (*)   ? All other components within normal limits  ?URINALYSIS, ROUTINE W REFLEX MICROSCOPIC - Abnormal; Notable for the following components:  ? Protein, ur >=300 (*)   ? Bacteria, UA RARE (*)   ? All other components  within normal limits  ?BLOOD GAS, VENOUS - Abnormal; Notable for the following components:  ? pCO2, Ven 43 (*)   ? pO2, Ven 60 (*)   ? Acid-base deficit 3.8 (*)   ? All other components within normal limits  ?CBG MONITORING, ED - Abnormal; Notable for the following components:  ? Glucose-Capillary 46 (*)   ? All other components within normal limits  ?LIPASE, BLOOD  ?AMMONIA  ?CBG MONITORING, ED  ? ? ?EKG ?EKG Interpretation ? ?Date/Time:  Friday March 18 2022 09:18:18 EDT ?Ventricular Rate:  83 ?PR Interval:  247 ?QRS Duration: 92 ?QT Interval:  407 ?QTC Calculation: 479 ?R Axis:   9 ?Text Interpretation: Sinus rhythm Prolonged PR interval Anterior infarct, old Confirmed by Lavenia Atlas [phone]) on 03/18/2022 10:22:00 AM ? ?Radiology ?CT Head Wo Contrast ? ?Result Date: 03/18/2022 ?CLINICAL DATA:  Mental status change. EXAM: CT HEAD WITHOUT CONTRAST TECHNIQUE: Contiguous axial images were obtained from the base of the skull through the vertex without intravenous contrast. RADIATION DOSE REDUCTION: This exam was performed according to the departmental dose-optimization program which includes automated exposure control, adjustment of the mA and/or kV according to patient size and/or use of iterative reconstruction technique. COMPARISON:  11/29/2021 FINDINGS: Brain: No evidence of acute infarction, hemorrhage, extra-axial collection, ventriculomegaly, or mass effect.  Encephalomalacia of the right temporoparietal lobe from prior insult. Generalized cerebral atrophy. Periventricular white matter low attenuation likely secondary to microangiopathy. Vascular: Cerebrovascular

## 2022-03-18 NOTE — ED Notes (Signed)
Unable to get oral or axillary temperature at this time. Pt refusing rectal temperature. MD made aware. Will give pt warm blankets and try to obtain temp again later.  ?

## 2022-03-18 NOTE — Discharge Instructions (Addendum)
You have been seen and discharged from the emergency department.  Make sure you eat small regular meals especially around her insulin doses.  Stable hydrated.  Follow-up with your primary provider for further evaluation and further care. Take home medications as prescribed. If you have any worsening symptoms or further concerns for your health please return to an emergency department for further evaluation. ?

## 2022-03-18 NOTE — ED Notes (Addendum)
Pt given 2 cups of cranberry juice and was able to keep it down without difficulty.  ?

## 2022-03-18 NOTE — ED Triage Notes (Signed)
Pt BIB EMS from home. Wife found pt in chair this morning in a deep sleep and was unable to wake him up. Pt unsure if he took his insulin this morning. Pt A&O x4 at this time.  ? ?CBG 36 ?22G LH ?250 D10 ?CBG went up to 163 ?BP 200/100 ?HR 90 ? ?

## 2022-03-21 ENCOUNTER — Ambulatory Visit: Payer: Medicare Other | Admitting: Nurse Practitioner

## 2022-04-06 ENCOUNTER — Ambulatory Visit (INDEPENDENT_AMBULATORY_CARE_PROVIDER_SITE_OTHER): Payer: Medicare Other | Admitting: Nurse Practitioner

## 2022-04-06 ENCOUNTER — Encounter: Payer: Self-pay | Admitting: Nurse Practitioner

## 2022-04-06 VITALS — BP 137/72 | HR 68 | Temp 97.9°F | Ht 72.84 in | Wt 278.8 lb

## 2022-04-06 DIAGNOSIS — Z7689 Persons encountering health services in other specified circumstances: Secondary | ICD-10-CM

## 2022-04-06 DIAGNOSIS — G40909 Epilepsy, unspecified, not intractable, without status epilepticus: Secondary | ICD-10-CM | POA: Diagnosis not present

## 2022-04-06 DIAGNOSIS — Z6836 Body mass index (BMI) 36.0-36.9, adult: Secondary | ICD-10-CM | POA: Diagnosis not present

## 2022-04-06 DIAGNOSIS — E785 Hyperlipidemia, unspecified: Secondary | ICD-10-CM | POA: Diagnosis not present

## 2022-04-06 DIAGNOSIS — E1159 Type 2 diabetes mellitus with other circulatory complications: Secondary | ICD-10-CM

## 2022-04-06 DIAGNOSIS — I152 Hypertension secondary to endocrine disorders: Secondary | ICD-10-CM | POA: Diagnosis not present

## 2022-04-06 DIAGNOSIS — I6930 Unspecified sequelae of cerebral infarction: Secondary | ICD-10-CM

## 2022-04-06 DIAGNOSIS — E1142 Type 2 diabetes mellitus with diabetic polyneuropathy: Secondary | ICD-10-CM | POA: Diagnosis not present

## 2022-04-06 DIAGNOSIS — Z794 Long term (current) use of insulin: Secondary | ICD-10-CM | POA: Diagnosis not present

## 2022-04-06 DIAGNOSIS — E1169 Type 2 diabetes mellitus with other specified complication: Secondary | ICD-10-CM | POA: Diagnosis not present

## 2022-04-06 MED ORDER — INSULIN GLARGINE 100 UNIT/ML SOLOSTAR PEN: 40.0000 [IU] | PEN_INJECTOR | Freq: Every day | SUBCUTANEOUS | 2 refills | Status: DC

## 2022-04-06 MED ORDER — GLUCOSE BLOOD VI STRP: ORAL_STRIP | 11 refills | Status: DC

## 2022-04-06 MED ORDER — PREGABALIN 50 MG PO CAPS: 50.0000 mg | ORAL_CAPSULE | Freq: Two times a day (BID) | ORAL | 2 refills | Status: DC | PRN

## 2022-04-06 NOTE — Progress Notes (Signed)
? ?New Patient Office Visit ? ?Subjective   ? ?Patient ID: Brandon [name], male    DOB: [birth year]  Age: 61 y.o. MRN: [id number] ? ?CC:  ?Chief Complaint  ?Patient presents with  ? New Patient (Initial Visit)  ? ? ?HPI ?Brandon [name] presents to establish care ?Patient coming from a different local provider. States that he missed an appointment and he was discharged from the practice.  ?-had episode, two weeks ago, when blood sugar dropped during his sleep and he had seizure. Was hospitalized overnight for this. Blood sugar dropped to 26. Body temperature was only readable through rectal thermometer.  ?-has not seen endocrinology for some time. Does have diabetic neuropathy. Takes lyrica.  ?-has seizure disorder.   ? ?Outpatient Encounter Medications as of 04/06/2022  ?Medication Sig  ? amLODipine (NORVASC) 10 MG tablet TAKE 1 TABLET BY MOUTH EVERY DAY (Patient taking differently: Take 20 mg by mouth daily.)  ? blood glucose meter kit and supplies Dispense based on patient and insurance preference. Use up to four times daily as directed. (FOR ICD-9 250.00, 250.01). (Patient taking differently: 1 each by Other route See admin instructions. Dispense based on patient and insurance preference. Use up to four times daily as directed. (FOR ICD-9 250.00, 250.01).)  ? HYDROcodone-acetaminophen (NORCO) 5-325 MG tablet Take 1 tablet by mouth every 6 (six) hours as needed for moderate pain.  ? Insulin Pen Needle (NOVOFINE PLUS PEN NEEDLE) 32G X 4 MM MISC Use as directed with insulin pen  ? Lancets (ACCU-CHEK SOFT TOUCH) lancets Use as instructed (Patient taking differently: 1 each by Other route as directed.)  ? pregabalin (LYRICA) 50 MG capsule Take 1 capsule (50 mg total) by mouth 2 (two) times daily as needed.  ? [DISCONTINUED] glucose blood (ACCU-CHEK ACTIVE STRIPS) test strip Use as instructed (Patient taking differently: 1 each by Other route as directed.)  ? [DISCONTINUED] glucose blood test strip accu chek active  1-2 times daily (Patient taking differently: 1 each by Other route See admin instructions. accu chek active 1-2 times daily)  ? [DISCONTINUED] glucose blood test strip Blood sugar testing done four times daily as needed for insulin dependant type 2 diabetes -E11.65  ? atorvastatin (LIPITOR) 40 MG tablet Take 1 tablet (40 mg total) by mouth daily.  ? ezetimibe (ZETIA) 10 MG tablet Take 1 tablet (10 mg total) by mouth daily.  ? insulin aspart (NOVOLOG) 100 UNIT/ML FlexPen Inject 2-10 Units into the skin 3 (three) times daily with meals. If glucose 150-200 take 2 units, glucose 201-250 take 4 units, 251-300 take 6 units, glucose 301-350 take 8 units, glucose 351-400 take 10 units (Patient taking differently: Inject 2-10 Units into the skin 3 (three) times daily with meals. Inject 2-10 units into the skin 3 times a day with meals, PER SLIDING SCALE: BGL 150-200 take 2 units, glucose 201-250 take 4 units, 251-300 take 6 units, glucose 301-350 take 8 units, glucose 351-400 take 10 units)  ? levETIRAcetam (KEPPRA) 500 MG tablet Take 2 tablets (1,000 mg total) by mouth daily. (Patient taking differently: Take 500 mg by mouth in the morning and at bedtime.)  ? omeprazole (PRILOSEC) 40 MG capsule Take 1 capsule (40 mg total) by mouth daily. (Patient taking differently: Take 40 mg by mouth daily as needed (heartburn/indigestion).)  ? QUEtiapine (SEROQUEL) 25 MG tablet Take 1 tablet (25 mg total) by mouth at bedtime.  ? sertraline (ZOLOFT) 25 MG tablet Take 1 tablet (25 mg total) by mouth daily.  ? [  DISCONTINUED] cephALEXin (KEFLEX) 500 MG capsule 2 caps po bid x 7 days (Patient not taking: Reported on 03/18/2022)  ? [DISCONTINUED] doxycycline (VIBRAMYCIN) 100 MG capsule Take 1 capsule (100 mg total) by mouth 2 (two) times daily. (Patient not taking: Reported on 03/18/2022)  ? [DISCONTINUED] hydrALAZINE (APRESOLINE) 100 MG tablet Take 1 tablet (100 mg total) by mouth every 8 (eight) hours. (Patient not taking: Reported on  03/18/2022)  ? [DISCONTINUED] insulin glargine (LANTUS) 100 UNIT/ML Solostar Pen Inject 20 Units into the skin daily. (Patient taking differently: Inject 40 Units into the skin daily.)  ? [DISCONTINUED] insulin glargine (LANTUS) 100 UNIT/ML Solostar Pen Inject 40 Units into the skin daily.  ? ?No facility-administered encounter medications on file as of 04/06/2022.  ? ? ?Past Medical History:  ?Diagnosis Date  ? Acute ischemic stroke ([place]) 11/2013  ? Allergy   ? Anxiety   ? Arthritis   ? Benign hypertension with CKD (chronic kidney disease) stage III (HCC)   ? 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 left and right; cryoablation bil  ? Depression   ? [facility] in [place] Depression  ? Diabetes mellitus   ? DKA prior hospitalization  ? Diverticulitis   ? s/p micorperforation Sept 2012-managed conservatively by Gen surgery  ? ED (erectile dysfunction)   ? Focal seizure ([place]) 11/2013  ? due to ischemic stroke  ? GERD (gastroesophageal reflux disease)   ? Hiatal hernia   ? Hyperlipidemia   ? Hypertension   ? Seizures ([place])   ? none since 2016, taking Keppra - maw  ? Sleep apnea   ? Thyroid disease   ? "weak thyroid" per MD  ? Wears glasses   ? ? ?Past Surgical History:  ?Procedure Laterality Date  ? COLONOSCOPY    ? IR RADIOLOGIST EVAL & MGMT  04/04/2017  ? KIDNEY SURGERY    ? ablation of renal cell CA - 12/28, prior one was October 2012-Dr. Kathlene Cote  ? TEE WITHOUT CARDIOVERSION N/A 11/19/2013  ? Procedure: TRANSESOPHAGEAL ECHOCARDIOGRAM (TEE);  Surgeon: Lelon Perla, MD;  Location: [facility];  Service: Cardiovascular;  Laterality: N/A;  ? ? ?Family History  ?Problem Relation Age of Onset  ? Diabetes Father   ? Heart disease Father   ? Pneumonia Mother   ? Prostate cancer Other   ? Stomach cancer Other   ? Breast cancer Sister   ? Colon cancer Neg Hx   ? Esophageal cancer Neg Hx   ? Rectal cancer Neg Hx   ? ? ?Social History  ? ?Socioeconomic History  ? Marital status: Single  ?  Spouse name: Not on file  ? Number of  children: 2  ? Years of education: college  ? Highest education level: Not on file  ?Occupational History  ? Occupation: disabled  ?Tobacco Use  ? Smoking status: Never  ? Smokeless tobacco: Never  ?Vaping Use  ? Vaping Use: Never used  ?Substance and Sexual Activity  ? Alcohol use: Not Currently  ?  Comment: rarely  ? Drug use: No  ? Sexual activity: Never  ?Other Topics Concern  ? Not on file  ?Social History Narrative  ? Patient lives alone.  ? Education. Two years of college.  ? Not working.  ? Right handed.  ? Caffeine one soda daily. Drinks coffee   ? ?Social Determinants of Health  ? ?Financial Resource Strain: Low Risk   ? Difficulty of Paying Living Expenses: Not very hard  ?Food Insecurity: Not  on file  ?Transportation Needs: Not on file  ?Physical Activity: Not on file  ?Stress: Not on file  ?Social Connections: Not on file  ?Intimate Partner Violence: Not on file  ? ? ?Review of Systems  ?Constitutional:  Negative for chills, fever and malaise/fatigue.  ?HENT:  Negative for congestion, sinus pain and sore throat.   ?Eyes: Negative.   ?Respiratory:  Negative for cough, shortness of breath and wheezing.   ?Cardiovascular:  Negative for chest pain, palpitations and leg swelling.  ?Gastrointestinal:  Negative for constipation, diarrhea, nausea and vomiting.  ?Genitourinary: Negative.   ?Musculoskeletal:  Positive for back pain and myalgias.  ?Skin: Negative.   ?Neurological:  Positive for sensory change and seizures. Negative for dizziness and headaches.  ?Endo/Heme/Allergies:  Does not bruise/bleed easily.  ?     Poorly controlled type 2 diabetes.   ?Psychiatric/Behavioral:  Negative for depression. The patient is nervous/anxious.   ? ?  ? ? ?Objective   ? ?Today's Vitals  ? 04/06/22 1513  ?BP: 137/72  ?Pulse: 68  ?Temp: 97.9 ?F (36.6 ?C)  ?SpO2: 100%  ?Weight: 278 lb 12.8 oz (126.5 kg)  ?Height: 6' 0.84" (1.85 m)  ? ?Body mass index is 36.95 kg/m?.  ? ?Physical Exam ?Vitals and nursing note reviewed.   ?Constitutional:   ?   Appearance: Normal appearance. He is well-developed.  ?HENT:  ?   Head: Normocephalic and atraumatic.  ?Eyes:  ?   Pupils: Pupils are equal, round, and reactive to light.  ?Cardiovascular:  ?

## 2022-04-11 ENCOUNTER — Other Ambulatory Visit: Payer: Self-pay | Admitting: Nurse Practitioner

## 2022-04-11 ENCOUNTER — Other Ambulatory Visit: Payer: Self-pay

## 2022-04-11 DIAGNOSIS — E1142 Type 2 diabetes mellitus with diabetic polyneuropathy: Secondary | ICD-10-CM

## 2022-04-11 MED ORDER — INSULIN GLARGINE 100 UNIT/ML SOLOSTAR PEN: 40.0000 [IU] | PEN_INJECTOR | Freq: Every day | SUBCUTANEOUS | 2 refills | Status: DC

## 2022-04-15 ENCOUNTER — Other Ambulatory Visit: Payer: Self-pay

## 2022-04-15 DIAGNOSIS — R5383 Other fatigue: Secondary | ICD-10-CM

## 2022-04-15 DIAGNOSIS — R7301 Impaired fasting glucose: Secondary | ICD-10-CM

## 2022-04-15 DIAGNOSIS — Z0000 Encounter for general adult medical examination without abnormal findings: Secondary | ICD-10-CM

## 2022-04-15 DIAGNOSIS — Z125 Encounter for screening for malignant neoplasm of prostate: Secondary | ICD-10-CM

## 2022-04-15 DIAGNOSIS — Z130 Encounter for screening for diseases of the blood and blood-forming organs and certain disorders involving the immune mechanism: Secondary | ICD-10-CM

## 2022-04-17 DIAGNOSIS — E669 Obesity, unspecified: Secondary | ICD-10-CM | POA: Insufficient documentation

## 2022-04-17 DIAGNOSIS — Z6836 Body mass index (BMI) 36.0-36.9, adult: Secondary | ICD-10-CM | POA: Insufficient documentation

## 2022-04-19 ENCOUNTER — Other Ambulatory Visit: Payer: Medicare Other

## 2022-04-19 DIAGNOSIS — R5383 Other fatigue: Secondary | ICD-10-CM | POA: Diagnosis not present

## 2022-04-19 DIAGNOSIS — Z1329 Encounter for screening for other suspected endocrine disorder: Secondary | ICD-10-CM | POA: Diagnosis not present

## 2022-04-19 DIAGNOSIS — Z13228 Encounter for screening for other metabolic disorders: Secondary | ICD-10-CM | POA: Diagnosis not present

## 2022-04-19 DIAGNOSIS — R7301 Impaired fasting glucose: Secondary | ICD-10-CM | POA: Diagnosis not present

## 2022-04-19 DIAGNOSIS — Z130 Encounter for screening for diseases of the blood and blood-forming organs and certain disorders involving the immune mechanism: Secondary | ICD-10-CM | POA: Diagnosis not present

## 2022-04-19 DIAGNOSIS — Z1321 Encounter for screening for nutritional disorder: Secondary | ICD-10-CM | POA: Diagnosis not present

## 2022-04-19 DIAGNOSIS — Z0000 Encounter for general adult medical examination without abnormal findings: Secondary | ICD-10-CM

## 2022-04-20 ENCOUNTER — Emergency Department (HOSPITAL_COMMUNITY): Payer: Medicare Other

## 2022-04-20 ENCOUNTER — Emergency Department (HOSPITAL_COMMUNITY)
Admission: EM | Admit: 2022-04-20 | Discharge: 2022-04-20 | Disposition: A | Discharge status: Home / Self Care | Payer: Medicare Other | Attending: Emergency Medicine | Admitting: Emergency Medicine

## 2022-04-20 ENCOUNTER — Other Ambulatory Visit: Payer: Self-pay

## 2022-04-20 DIAGNOSIS — E162 Hypoglycemia, unspecified: Secondary | ICD-10-CM

## 2022-04-20 DIAGNOSIS — Z794 Long term (current) use of insulin: Secondary | ICD-10-CM | POA: Insufficient documentation

## 2022-04-20 DIAGNOSIS — E11649 Type 2 diabetes mellitus with hypoglycemia without coma: Secondary | ICD-10-CM | POA: Diagnosis not present

## 2022-04-20 DIAGNOSIS — N189 Chronic kidney disease, unspecified: Secondary | ICD-10-CM | POA: Insufficient documentation

## 2022-04-20 DIAGNOSIS — I129 Hypertensive chronic kidney disease with stage 1 through stage 4 chronic kidney disease, or unspecified chronic kidney disease: Secondary | ICD-10-CM | POA: Diagnosis not present

## 2022-04-20 DIAGNOSIS — E161 Other hypoglycemia: Secondary | ICD-10-CM | POA: Diagnosis not present

## 2022-04-20 DIAGNOSIS — E10649 Type 1 diabetes mellitus with hypoglycemia without coma: Secondary | ICD-10-CM | POA: Insufficient documentation

## 2022-04-20 DIAGNOSIS — Z79899 Other long term (current) drug therapy: Secondary | ICD-10-CM | POA: Insufficient documentation

## 2022-04-20 DIAGNOSIS — R739 Hyperglycemia, unspecified: Secondary | ICD-10-CM | POA: Diagnosis not present

## 2022-04-20 DIAGNOSIS — R9431 Abnormal electrocardiogram [ECG] [EKG]: Secondary | ICD-10-CM | POA: Diagnosis not present

## 2022-04-20 DIAGNOSIS — R4020 Unspecified coma: Secondary | ICD-10-CM | POA: Diagnosis not present

## 2022-04-20 DIAGNOSIS — R404 Transient alteration of awareness: Secondary | ICD-10-CM | POA: Diagnosis not present

## 2022-04-20 LAB — COMPREHENSIVE METABOLIC PANEL
ALT: 11 IU/L (ref 0–44)
AST: 12 IU/L (ref 0–40)
Albumin/Globulin Ratio: 1.3 (ref 1.2–2.2)
Albumin: 3.8 g/dL (ref 3.8–4.9)
Alkaline Phosphatase: 96 IU/L (ref 44–121)
BUN/Creatinine Ratio: 10 (ref 9–20)
BUN: 62 mg/dL — ABNORMAL HIGH (ref 6–24)
Bilirubin Total: 0.4 mg/dL (ref 0.0–1.2)
CO2: 17 mmol/L — ABNORMAL LOW (ref 20–29)
Calcium: 8.5 mg/dL — ABNORMAL LOW (ref 8.7–10.2)
Chloride: 112 mmol/L — ABNORMAL HIGH (ref 96–106)
Creatinine, Ser: 6.23 mg/dL — ABNORMAL HIGH (ref 0.76–1.27)
Globulin, Total: 3 g/dL (ref 1.5–4.5)
Glucose: 99 mg/dL (ref 70–99)
Potassium: 4.7 mmol/L (ref 3.5–5.2)
Sodium: 146 mmol/L — ABNORMAL HIGH (ref 134–144)
Total Protein: 6.8 g/dL (ref 6.0–8.5)
eGFR: 10 mL/min/{1.73_m2} — ABNORMAL LOW (ref >59)

## 2022-04-20 LAB — CBC WITH DIFFERENTIAL/PLATELET
Basophils Absolute: 0 10*3/uL (ref 0.0–0.2)
Basos: 1 %
EOS (ABSOLUTE): 0.1 10*3/uL (ref 0.0–0.4)
Eos: 2 %
Hematocrit: 30.2 % — ABNORMAL LOW (ref 37.5–51.0)
Hemoglobin: 10.1 g/dL — ABNORMAL LOW (ref 13.0–17.7)
Immature Grans (Abs): 0 10*3/uL (ref 0.0–0.1)
Immature Granulocytes: 1 %
Lymphocytes Absolute: 1.8 10*3/uL (ref 0.7–3.1)
Lymphs: 30 %
MCH: 29.8 pg (ref 26.6–33.0)
MCHC: 33.4 g/dL (ref 31.5–35.7)
MCV: 89 fL (ref 79–97)
Monocytes Absolute: 0.5 10*3/uL (ref 0.1–0.9)
Monocytes: 8 %
Neutrophils Absolute: 3.5 10*3/uL (ref 1.4–7.0)
Neutrophils: 58 %
Platelets: 229 10*3/uL (ref 150–450)
RBC: 3.39 x10E6/uL — ABNORMAL LOW (ref 4.14–5.80)
RDW: 12.8 % (ref 11.6–15.4)
WBC: 6 10*3/uL (ref 3.4–10.8)

## 2022-04-20 LAB — T4, FREE: Free T4: 0.92 ng/dL (ref 0.82–1.77)

## 2022-04-20 LAB — LIPID PANEL
Chol/HDL Ratio: 3.1 ratio (ref 0.0–5.0)
Cholesterol, Total: 144 mg/dL (ref 100–199)
HDL: 46 mg/dL (ref >39)
LDL Chol Calc (NIH): 91 mg/dL (ref 0–99)
Triglycerides: 28 mg/dL (ref 0–149)
VLDL Cholesterol Cal: 7 mg/dL (ref 5–40)

## 2022-04-20 LAB — URINALYSIS, ROUTINE W REFLEX MICROSCOPIC
Bilirubin Urine: NEGATIVE
Glucose, UA: NEGATIVE mg/dL
Hgb urine dipstick: NEGATIVE
Ketones, ur: NEGATIVE mg/dL
Nitrite: NEGATIVE
Protein, ur: 100 mg/dL — AB
Specific Gravity, Urine: 1.012 (ref 1.005–1.030)
pH: 5 (ref 5.0–8.0)

## 2022-04-20 LAB — CBG MONITORING, ED
Glucose-Capillary: 62 mg/dL — ABNORMAL LOW (ref 70–99)
Glucose-Capillary: 109 mg/dL — ABNORMAL HIGH (ref 70–99)
Glucose-Capillary: 128 mg/dL — ABNORMAL HIGH (ref 70–99)

## 2022-04-20 LAB — HEMOGLOBIN A1C
Est. average glucose Bld gHb Est-mCnc: 111 mg/dL
Hgb A1c MFr Bld: 5.5 % (ref 4.8–5.6)

## 2022-04-20 LAB — TSH: TSH: 1.02 u[IU]/mL (ref 0.450–4.500)

## 2022-04-20 MED ORDER — DEXTROSE 50 % IV SOLN
1.0000 | Freq: Once | INTRAVENOUS | Status: AC
  Administered 2022-04-20: 50 mL via INTRAVENOUS
  Filled 2022-04-20: qty 50

## 2022-04-20 NOTE — ED Notes (Signed)
Due to sugar being 62, pt given two orange juice cups and a graham cracker.  ?

## 2022-04-20 NOTE — ED Provider Notes (Addendum)
[facility] EMERGENCY DEPARTMENT Provider Note   CSN: [id number] Arrival date & time: 04/20/22  1508     History  Chief Complaint  Patient presents with   Hypoglycemia    Brandon [name] is a 61 y.o. male.  Patient seen in April for hypoglycemia.  Patient is an insulin-dependent diabetic.  Also history of hypertension diverticulitis history of seizures but none since 2016 taking Keppra.  Chronic kidney disease acute ischemic stroke in 2014.  Patient brought in for blood sugar being low.  Patient has some mental delay.  Patient food intake is been less than usual.  This may be the cause for the low blood sugars.  Blood sugar was 30 at home according to EMS.  Upon arrival here was 75 patient was given glucagon.  For the blood sugar 62 here he was given some juice and graham crackers but I gave an amp of D50.  Patient given full meals to eat.  Will observe and see if blood sugars remain stable.  According to significant other patient has not been eating as much as usual.  But still gives full amount of insulin.  They tried to get him to reduce that.  But he is stubborn about it.  Patient not sick.  No nausea vomiting.  No fevers.      Home Medications Prior to Admission medications   Medication Sig Start Date End Date Taking? Authorizing Provider  amLODipine (NORVASC) 10 MG tablet TAKE 1 TABLET BY MOUTH EVERY DAY Patient taking differently: Take 20 mg by mouth daily. 11/10/21  Yes Tysinger, Camelia Eng, PA-C  atorvastatin (LIPITOR) 40 MG tablet Take 40 mg by mouth daily.   Yes [provider]  ezetimibe (ZETIA) 10 MG tablet Take 10 mg by mouth daily.   Yes [provider]  HYDROcodone-acetaminophen (NORCO) 5-325 MG tablet Take 1 tablet by mouth every 6 (six) hours as needed for moderate pain. 03/18/22  Yes Horton, Kristie M, DO  insulin aspart (NOVOLOG FLEXPEN) 100 UNIT/ML FlexPen Inject 2-10 Units into the skin 3 (three) times daily with meals. 2-10  units, Subcutaneous, 3 times daily with meals, If Glucose is 150-200=2 units, 201-250=4 units, 251-300=6 units, 301-350=8 units, 351-400=10 units   Yes [provider]  insulin glargine (LANTUS) 100 UNIT/ML Solostar Pen Inject 40 Units into the skin daily. Patient taking differently: Inject 20 Units into the skin at bedtime. 04/11/22  Yes Boscia, Greer Ee, NP  levETIRAcetam (KEPPRA) 500 MG tablet Take 500 mg by mouth 2 (two) times daily. 11/15   Yes [provider]  pregabalin (LYRICA) 50 MG capsule Take 1 capsule (50 mg total) by mouth 2 (two) times daily as needed. Patient taking differently: Take 50 mg by mouth 2 (two) times daily as needed (nerve pain). 04/06/22  Yes Boscia, Heather E, NP  QUEtiapine (SEROQUEL) 25 MG tablet Take 25 mg by mouth at bedtime.   Yes [provider]  sertraline (ZOLOFT) 25 MG tablet Take 25 mg by mouth daily.   Yes [provider]  atorvastatin (LIPITOR) 40 MG tablet Take 1 tablet (40 mg total) by mouth daily. 10/19/21 03/18/22  Tysinger, Camelia Eng, PA-C  blood glucose meter kit and supplies Dispense based on patient and insurance preference. Use up to four times daily as directed. (FOR ICD-9 250.00, 250.01). Patient taking differently: 1 each by Other route See admin instructions. Dispense based on patient and insurance preference. Use up to four times daily as directed. (FOR ICD-9 250.00, 250.01).  04/24/16   Eugenie Filler, MD  ezetimibe (ZETIA) 10 MG tablet Take 1 tablet (10 mg total) by mouth daily. 10/19/21 03/18/22  Tysinger, Camelia Eng, PA-C  glucose blood (ONETOUCH ULTRA) test strip Blood sugar testing done TID for insulin dependant Type @ diabetes - E11.65 04/12/22   Ronnell Freshwater, NP  insulin aspart (NOVOLOG) 100 UNIT/ML FlexPen Inject 2-10 Units into the skin 3 (three) times daily with meals. If glucose 150-200 take 2 units, glucose 201-250 take 4 units, 251-300 take 6 units, glucose 301-350 take 8 units, glucose 351-400 take 10  units Patient taking differently: Inject 2-10 Units into the skin 3 (three) times daily with meals. 2-10 units, Subcutaneous, 3 times daily with meals, If Glucose is 150-200=2 units, 201-250=4 units, 251-300=6 units, 301-350=8 units, 351-400=10 units 07/10/21 03/18/22  [place] Eric J, DO  Insulin Pen Needle (NOVOFINE PLUS PEN NEEDLE) 32G X 4 MM MISC Use as directed with insulin pen 07/10/21   [place] Eric J, DO  Lancets (ACCU-CHEK SOFT TOUCH) lancets Use as instructed Patient taking differently: 1 each by Other route as directed. 06/11/19   Tysinger, Camelia Eng, PA-C  levETIRAcetam (KEPPRA) 500 MG tablet Take 2 tablets (1,000 mg total) by mouth daily. Patient taking differently: Take 500 mg by mouth in the morning and at bedtime. 10/19/21 03/18/22  Tysinger, Camelia Eng, PA-C  omeprazole (PRILOSEC) 40 MG capsule Take 1 capsule (40 mg total) by mouth daily. Patient taking differently: Take 40 mg by mouth daily as needed (heartburn/indigestion). 07/10/21 03/18/22  [place] Donnamarie Poag, DO  QUEtiapine (SEROQUEL) 25 MG tablet Take 1 tablet (25 mg total) by mouth at bedtime. 10/19/21 03/18/22  Tysinger, Camelia Eng, PA-C  sertraline (ZOLOFT) 25 MG tablet Take 1 tablet (25 mg total) by mouth daily. 10/19/21 03/18/22  Tysinger, Camelia Eng, PA-C      Allergies    Hydralazine and Morphine    Review of Systems   Review of Systems  Constitutional:  Negative for chills and fever.  HENT:  Negative for ear pain and sore throat.   Eyes:  Negative for pain and visual disturbance.  Respiratory:  Negative for cough and shortness of breath.   Cardiovascular:  Negative for chest pain and palpitations.  Gastrointestinal:  Negative for abdominal pain and vomiting.  Genitourinary:  Negative for dysuria and hematuria.  Musculoskeletal:  Negative for arthralgias and back pain.  Skin:  Negative for color change and rash.  Neurological:  Negative for seizures and syncope.  All other systems reviewed and are negative.  Physical Exam Updated  Vital Signs BP (!) 111/93 (BP Location: Left Arm)   Pulse 78   Temp 98.4 F (36.9 C) (Oral)   Resp 14   Ht 1.829 m (6')   Wt 117.9 kg   SpO2 97%   BMI 35.26 kg/m  Physical Exam Vitals and nursing note reviewed.  Constitutional:      General: He is not in acute distress.    Appearance: Normal appearance. He is well-developed. He is not ill-appearing.  HENT:     Head: Normocephalic and atraumatic.  Eyes:     Extraocular Movements: Extraocular movements intact.     Conjunctiva/sclera: Conjunctivae normal.     Pupils: Pupils are equal, round, and reactive to light.  Cardiovascular:     Rate and Rhythm: Normal rate and regular rhythm.     Heart sounds: No murmur heard. Pulmonary:     Effort: Pulmonary effort is normal. No respiratory distress.     Breath  sounds: Normal breath sounds.  Abdominal:     Palpations: Abdomen is soft.     Tenderness: There is no abdominal tenderness.  Musculoskeletal:        General: No swelling.     Cervical back: Neck supple.  Skin:    General: Skin is warm and dry.     Capillary Refill: Capillary refill takes less than 2 seconds.  Neurological:     Mental Status: He is alert. Mental status is at baseline.     Sensory: No sensory deficit.     Motor: No weakness.  Psychiatric:        Mood and Affect: Mood normal.    ED Results / Procedures / Treatments   Labs (all labs ordered are listed, but only abnormal results are displayed) Labs Reviewed  URINALYSIS, ROUTINE W REFLEX MICROSCOPIC - Abnormal; Notable for the following components:      Result Value   APPearance HAZY (*)    Protein, ur 100 (*)    Leukocytes,Ua TRACE (*)    Bacteria, UA FEW (*)    All other components within normal limits  CBG MONITORING, ED - Abnormal; Notable for the following components:   Glucose-Capillary 62 (*)    All other components within normal limits  CBG MONITORING, ED - Abnormal; Notable for the following components:   Glucose-Capillary 109 (*)    All  other components within normal limits  CBG MONITORING, ED - Abnormal; Notable for the following components:   Glucose-Capillary 128 (*)    All other components within normal limits    EKG EKG Interpretation  Date/Time:  Wednesday Apr 20 2022 15:16:45 EDT Ventricular Rate:  79 PR Interval:  265 QRS Duration: 101 QT Interval:  414 QTC Calculation: 475 R Axis:   68 Text Interpretation: Sinus rhythm Prolonged PR interval Low voltage, precordial leads Anteroseptal infarct, old No significant change since last tracing Confirmed by Deno Etienne [phone]) on 04/20/2022 3:23:15 PM  Radiology No results found.  Procedures Procedures    Medications Ordered in ED Medications  dextrose 50 % solution 50 mL (50 mLs Intravenous Given 04/20/22 1635)    ED Course/ Medical Decision Making/ A&P                           Medical Decision Making Amount and/or Complexity of Data Reviewed Labs: ordered. Radiology: ordered.  Risk Prescription drug management.  Upon arrival patient given an amp of D50.  Patient given food to eat and ate very well.  Blood sugar came up to 100.  We are rechecking that.  If it is remaining stable patient can be discharged home.  Patient apparently made mention to the nurse that he wanted pain medicine.  Patient seems to be on Lyrica as a chronic pain med had a 30-day supply on May 3.  Patient not normally on narcotics.  Discussed this with patient.  Close follow-up with primary care doctor will be important.  Education about decreasing insulin use will be provided.  But it sounds as if patient is stubborn about this.  Sounds as if patient's food intake has been less than usual.  Final Clinical Impression(s) / ED Diagnoses Final diagnoses:  Hypoglycemia    Rx / DC Orders ED Discharge Orders     None         Fredia Sorrow, MD 04/20/22 2128    Fredia Sorrow, MD 04/22/22 1730

## 2022-04-20 NOTE — Discharge Instructions (Signed)
Would recommend making sure you are getting good diet intake.  Keep your insulin cut down if you have been doing.  Also follow-up with your primary care doctor for rechecks and you can discuss better pain control with them as well.  In the meantime continue the Lyrica that they have been prescribing.  Return for any new or worse symptoms ?

## 2022-04-20 NOTE — ED Notes (Signed)
Received verbal report from Victoria G RN at this time 

## 2022-04-20 NOTE — ED Triage Notes (Signed)
Pt BIB EMS due to hypoglycemia. Pt was unresponsive with a BS of 30.  Pt is axox4 on arrival. VSS.  ?

## 2022-04-20 NOTE — ED Provider Notes (Incomplete Revision)
?[facility] ?Provider Note ? ? ?CSN: [id number] ?Arrival date & time: 04/20/22  1508 ? ?  ? ?History ? ?No chief complaint on file. ? ? ?Brandon [name] is a 61 y.o. male. ? ?Patient seen in April for hypoglycemia.  Patient is an insulin-dependent diabetic.  Also history of hypertension diverticulitis history of seizures but none since 2016 taking Keppra.  Chronic kidney disease acute ischemic stroke in 2014.  Patient brought in for blood sugar being low.  Patient has some mental delay.  Patient food intake is been less than usual.  This may be the cause for the low blood sugars.  Blood sugar was 30 at home according to EMS.  Upon arrival here was 65 patient was given glucagon.  For the blood sugar 62 here he was given some juice and graham crackers but I gave an amp of D50.  Patient given full meals to eat.  Will observe and see if blood sugars remain stable. ? ?According to significant other patient has not been eating as much as usual.  But still gives full amount of insulin.  They tried to get him to reduce that.  But he is stubborn about it.  Patient not sick.  No nausea vomiting.  No fevers. ? ? ?  ? ?Home Medications ?Prior to Admission medications   ?Medication Sig Start Date End Date Taking? Authorizing Provider  ?amLODipine (NORVASC) 10 MG tablet TAKE 1 TABLET BY MOUTH EVERY DAY ?Patient taking differently: Take 20 mg by mouth daily. 11/10/21   Tysinger, Camelia Eng, PA-C  ?atorvastatin (LIPITOR) 40 MG tablet Take 1 tablet (40 mg total) by mouth daily. 10/19/21 03/18/22  Tysinger, Camelia Eng, PA-C  ?blood glucose meter kit and supplies Dispense based on patient and insurance preference. Use up to four times daily as directed. (FOR ICD-9 250.00, 250.01). ?Patient taking differently: 1 each by Other route See admin instructions. Dispense based on patient and insurance preference. Use up to four times daily as directed. (FOR ICD-9 250.00, 250.01). 04/24/16   Eugenie Filler,  MD  ?ezetimibe (ZETIA) 10 MG tablet Take 1 tablet (10 mg total) by mouth daily. 10/19/21 03/18/22  Tysinger, Camelia Eng, PA-C  ?glucose blood (ONETOUCH ULTRA) test strip Blood sugar testing done TID for insulin dependant Type @ diabetes - E11.65 04/12/22   Ronnell Freshwater, NP  ?HYDROcodone-acetaminophen (NORCO) 5-325 MG tablet Take 1 tablet by mouth every 6 (six) hours as needed for moderate pain. 03/18/22   Horton, Alvin Critchley, DO  ?insulin aspart (NOVOLOG) 100 UNIT/ML FlexPen Inject 2-10 Units into the skin 3 (three) times daily with meals. If glucose 150-200 take 2 units, glucose 201-250 take 4 units, 251-300 take 6 units, glucose 301-350 take 8 units, glucose 351-400 take 10 units ?Patient taking differently: Inject 2-10 Units into the skin 3 (three) times daily with meals. Inject 2-10 units into the skin 3 times a day with meals, PER SLIDING SCALE: BGL 150-200 take 2 units, glucose 201-250 take 4 units, 251-300 take 6 units, glucose 301-350 take 8 units, glucose 351-400 take 10 units 07/10/21 03/18/22  [place] Donnamarie Poag, DO  ?insulin glargine (LANTUS) 100 UNIT/ML Solostar Pen Inject 40 Units into the skin daily. 04/11/22   Ronnell Freshwater, NP  ?Insulin Pen Needle (NOVOFINE PLUS PEN NEEDLE) 32G X 4 MM MISC Use as directed with insulin pen 07/10/21   [place] Donnamarie Poag, DO  ?Lancets (ACCU-CHEK SOFT TOUCH) lancets Use as instructed ?Patient taking differently: 1 each  by Other route as directed. 06/11/19   Tysinger, Camelia Eng, PA-C  ?levETIRAcetam (KEPPRA) 500 MG tablet Take 2 tablets (1,000 mg total) by mouth daily. ?Patient taking differently: Take 500 mg by mouth in the morning and at bedtime. 10/19/21 03/18/22  Tysinger, Camelia Eng, PA-C  ?omeprazole (PRILOSEC) 40 MG capsule Take 1 capsule (40 mg total) by mouth daily. ?Patient taking differently: Take 40 mg by mouth daily as needed (heartburn/indigestion). 07/10/21 03/18/22  [place] Eric J, DO  ?pregabalin (LYRICA) 50 MG capsule Take 1 capsule (50 mg total) by mouth 2 (two) times daily  as needed. 04/06/22   Ronnell Freshwater, NP  ?QUEtiapine (SEROQUEL) 25 MG tablet Take 1 tablet (25 mg total) by mouth at bedtime. 10/19/21 03/18/22  Tysinger, Camelia Eng, PA-C  ?sertraline (ZOLOFT) 25 MG tablet Take 1 tablet (25 mg total) by mouth daily. 10/19/21 03/18/22  Tysinger, Camelia Eng, PA-C  ?   ? ?Allergies    ?Hydralazine and Morphine   ? ?Review of Systems   ?Review of Systems  ?Constitutional:  Negative for chills and fever.  ?HENT:  Negative for ear pain and sore throat.   ?Eyes:  Negative for pain and visual disturbance.  ?Respiratory:  Negative for cough and shortness of breath.   ?Cardiovascular:  Negative for chest pain and palpitations.  ?Gastrointestinal:  Negative for abdominal pain and vomiting.  ?Genitourinary:  Negative for dysuria and hematuria.  ?Musculoskeletal:  Negative for arthralgias and back pain.  ?Skin:  Negative for color change and rash.  ?Neurological:  Negative for seizures and syncope.  ?All other systems reviewed and are negative. ? ?Physical Exam ?Updated Vital Signs ?There were no vitals taken for this visit. ?Physical Exam ?Vitals and nursing note reviewed.  ?Constitutional:   ?   General: He is not in acute distress. ?   Appearance: Normal appearance. He is well-developed. He is not ill-appearing.  ?HENT:  ?   Head: Normocephalic and atraumatic.  ?Eyes:  ?   Extraocular Movements: Extraocular movements intact.  ?   Conjunctiva/sclera: Conjunctivae normal.  ?   Pupils: Pupils are equal, round, and reactive to light.  ?Cardiovascular:  ?   Rate and Rhythm: Normal rate and regular rhythm.  ?   Heart sounds: No murmur heard. ?Pulmonary:  ?   Effort: Pulmonary effort is normal. No respiratory distress.  ?   Breath sounds: Normal breath sounds.  ?Abdominal:  ?   Palpations: Abdomen is soft.  ?   Tenderness: There is no abdominal tenderness.  ?Musculoskeletal:     ?   General: No swelling.  ?   Cervical back: Neck supple.  ?Skin: ?   General: Skin is warm and dry.  ?   Capillary Refill:  Capillary refill takes less than 2 seconds.  ?Neurological:  ?   Mental Status: He is alert. Mental status is at baseline.  ?   Sensory: No sensory deficit.  ?   Motor: No weakness.  ?Psychiatric:     ?   Mood and Affect: Mood normal.  ? ? ?ED Results / Procedures / Treatments   ?Labs ?(all labs ordered are listed, but only abnormal results are displayed) ?Labs Reviewed - No data to display ? ?EKG ?None ? ?Radiology ?No results found. ? ?Procedures ?Procedures  ? ? ?Medications Ordered in ED ?Medications - No data to display ? ?ED Course/ Medical Decision Making/ A&P ?  ?                        ?  Medical Decision Making ?Amount and/or Complexity of Data Reviewed ?Labs: ordered. ?Radiology: ordered. ? ?Risk ?Prescription drug management. ? ?Upon arrival patient given an amp of D50.  Patient given food to eat and ate very well.  Blood sugar came up to 100.  We are rechecking that.  If it is remaining stable patient can be discharged home.  Patient apparently made mention to the nurse that he wanted pain medicine.  Patient seems to be on Lyrica as a chronic pain med had a 30-day supply on May 3.  Patient not normally on narcotics. ? ?Discussed this with patient. ? ?Close follow-up with primary care doctor will be important.  Education about decreasing insulin use will be provided.  But it sounds as if patient is stubborn about this.  Sounds as if patient's food intake has been less than usual. ? ?Final Clinical Impression(s) / ED Diagnoses ?Final diagnoses:  ?None  ? ? ?Rx / DC Orders ?ED Discharge Orders   ? ? None  ? ?  ? ? ?  ?Fredia Sorrow, MD ?04/20/22 2128 ? ?

## 2022-04-26 DIAGNOSIS — E113291 Type 2 diabetes mellitus with mild nonproliferative diabetic retinopathy without macular edema, right eye: Secondary | ICD-10-CM | POA: Diagnosis not present

## 2022-04-26 DIAGNOSIS — H04123 Dry eye syndrome of bilateral lacrimal glands: Secondary | ICD-10-CM | POA: Diagnosis not present

## 2022-04-26 LAB — HM DIABETES EYE EXAM

## 2022-04-27 ENCOUNTER — Encounter: Payer: Self-pay | Admitting: Nurse Practitioner

## 2022-04-27 ENCOUNTER — Ambulatory Visit (INDEPENDENT_AMBULATORY_CARE_PROVIDER_SITE_OTHER): Payer: Medicare Other | Admitting: Nurse Practitioner

## 2022-04-27 VITALS — BP 171/86 | HR 80 | Temp 98.1°F | Ht 72.84 in | Wt 304.4 lb

## 2022-04-27 DIAGNOSIS — I152 Hypertension secondary to endocrine disorders: Secondary | ICD-10-CM

## 2022-04-27 DIAGNOSIS — Z85528 Personal history of other malignant neoplasm of kidney: Secondary | ICD-10-CM

## 2022-04-27 DIAGNOSIS — D509 Iron deficiency anemia, unspecified: Secondary | ICD-10-CM

## 2022-04-27 DIAGNOSIS — E1159 Type 2 diabetes mellitus with other circulatory complications: Secondary | ICD-10-CM

## 2022-04-27 DIAGNOSIS — G40909 Epilepsy, unspecified, not intractable, without status epilepticus: Secondary | ICD-10-CM | POA: Diagnosis not present

## 2022-04-27 DIAGNOSIS — E1121 Type 2 diabetes mellitus with diabetic nephropathy: Secondary | ICD-10-CM | POA: Diagnosis not present

## 2022-04-27 NOTE — Progress Notes (Signed)
Needs nephrology and hematology - review with patient 04/27/2022.

## 2022-04-27 NOTE — Progress Notes (Unsigned)
Established patient visit   Patient: Brandon [name]   DOB: [birth year]   61 y.o. Male  MRN: [id number] Visit Date: 04/27/2022   Chief Complaint  Patient presents with   Follow-up   Diabetes   Subjective    HPI  Follow up  -insulin dependant Type 2 diabetes -  -recent trip to ER due to hypoglycemia. Had to call EMS due to home blood sugar being 30. Was 69 when he arrived to the hospital  -had diabetic eye exam. Does have background diabetic retinopathy. Will follow up in 1 months  --check HgbA1c 04/19/2022 - 5.5 --CKD - needs referral to nephrology - creatinine >6.0 and BUN 62.  --mild anemia  --normal cholesterol  -no new concerns or complaints    Medications: Outpatient Medications Prior to Visit  Medication Sig   amLODipine (NORVASC) 10 MG tablet TAKE 1 TABLET BY MOUTH EVERY DAY (Patient taking differently: Take 20 mg by mouth daily.)   atorvastatin (LIPITOR) 40 MG tablet Take 40 mg by mouth daily.   blood glucose meter kit and supplies Dispense based on patient and insurance preference. Use up to four times daily as directed. (FOR ICD-9 250.00, 250.01). (Patient taking differently: 1 each by Other route See admin instructions. Dispense based on patient and insurance preference. Use up to four times daily as directed. (FOR ICD-9 250.00, 250.01).)   ezetimibe (ZETIA) 10 MG tablet Take 10 mg by mouth daily.   glucose blood (ONETOUCH ULTRA) test strip Blood sugar testing done TID for insulin dependant Type @ diabetes - E11.65   HYDROcodone-acetaminophen (NORCO) 5-325 MG tablet Take 1 tablet by mouth every 6 (six) hours as needed for moderate pain.   insulin aspart (NOVOLOG FLEXPEN) 100 UNIT/ML FlexPen Inject 2-10 Units into the skin 3 (three) times daily with meals. 2-10 units, Subcutaneous, 3 times daily with meals, If Glucose is 150-200=2 units, 201-250=4 units, 251-300=6 units, 301-350=8 units, 351-400=10 units   insulin glargine (LANTUS) 100 UNIT/ML Solostar Pen Inject 40 Units  into the skin daily. (Patient taking differently: Inject 20 Units into the skin at bedtime.)   Insulin Pen Needle (NOVOFINE PLUS PEN NEEDLE) 32G X 4 MM MISC Use as directed with insulin pen   Lancets (ACCU-CHEK SOFT TOUCH) lancets Use as instructed (Patient taking differently: 1 each by Other route as directed.)   levETIRAcetam (KEPPRA) 500 MG tablet Take 500 mg by mouth 2 (two) times daily. 11/15   pregabalin (LYRICA) 50 MG capsule Take 1 capsule (50 mg total) by mouth 2 (two) times daily as needed. (Patient taking differently: Take 50 mg by mouth 2 (two) times daily as needed (nerve pain).)   QUEtiapine (SEROQUEL) 25 MG tablet Take 25 mg by mouth at bedtime.   sertraline (ZOLOFT) 25 MG tablet Take 25 mg by mouth daily.   atorvastatin (LIPITOR) 40 MG tablet Take 1 tablet (40 mg total) by mouth daily.   ezetimibe (ZETIA) 10 MG tablet Take 1 tablet (10 mg total) by mouth daily.   insulin aspart (NOVOLOG) 100 UNIT/ML FlexPen Inject 2-10 Units into the skin 3 (three) times daily with meals. If glucose 150-200 take 2 units, glucose 201-250 take 4 units, 251-300 take 6 units, glucose 301-350 take 8 units, glucose 351-400 take 10 units (Patient taking differently: Inject 2-10 Units into the skin 3 (three) times daily with meals. 2-10 units, Subcutaneous, 3 times daily with meals, If Glucose is 150-200=2 units, 201-250=4 units, 251-300=6 units, 301-350=8 units, 351-400=10 units)   levETIRAcetam (KEPPRA) 500 MG tablet Take  2 tablets (1,000 mg total) by mouth daily. (Patient taking differently: Take 500 mg by mouth in the morning and at bedtime.)   omeprazole (PRILOSEC) 40 MG capsule Take 1 capsule (40 mg total) by mouth daily. (Patient taking differently: Take 40 mg by mouth daily as needed (heartburn/indigestion).)   QUEtiapine (SEROQUEL) 25 MG tablet Take 1 tablet (25 mg total) by mouth at bedtime.   sertraline (ZOLOFT) 25 MG tablet Take 1 tablet (25 mg total) by mouth daily.   No facility-administered  medications prior to visit.    Review of Systems  Constitutional:  Positive for fatigue. Negative for activity change, chills and fever.  HENT:  Negative for congestion, postnasal drip, rhinorrhea, sinus pressure, sinus pain, sneezing and sore throat.   Eyes: Negative.   Respiratory:  Negative for cough, shortness of breath and wheezing.   Cardiovascular:  Negative for chest pain and palpitations.       Severely elevated blood pressure   Gastrointestinal:  Negative for constipation, diarrhea, nausea and vomiting.  Endocrine: Negative for cold intolerance, heat intolerance, polydipsia and polyuria.       Erratic blood sugar. Overall, good control with HgbA1c 5.5 with recent lab check.   Genitourinary:  Negative for dysuria, frequency and urgency.  Musculoskeletal:  Negative for back pain and myalgias.  Skin:  Negative for rash.  Allergic/Immunologic: Negative for environmental allergies.  Neurological:  Positive for seizures and headaches. Negative for dizziness and weakness.  Psychiatric/Behavioral:  Positive for dysphoric mood. The patient is not nervous/anxious.    Last CBC Lab Results  Component Value Date   WBC 6.0 04/19/2022   HGB 10.1 (L) 04/19/2022   HCT 30.2 (L) 04/19/2022   MCV 89 04/19/2022   MCH 29.8 04/19/2022   RDW 12.8 04/19/2022   PLT 229 03/47/4259   Last metabolic panel Lab Results  Component Value Date   GLUCOSE 99 04/19/2022   NA 146 (H) 04/19/2022   K 4.7 04/19/2022   CL 112 (H) 04/19/2022   CO2 17 (L) 04/19/2022   BUN 62 (H) 04/19/2022   CREATININE 6.23 (H) 04/19/2022   EGFR 10 (L) 04/19/2022   CALCIUM 8.5 (L) 04/19/2022   PHOS 4.0 07/05/2021   PROT 6.8 04/19/2022   ALBUMIN 3.8 04/19/2022   LABGLOB 3.0 04/19/2022   AGRATIO 1.3 04/19/2022   BILITOT 0.4 04/19/2022   ALKPHOS 96 04/19/2022   AST 12 04/19/2022   ALT 11 04/19/2022   ANIONGAP 6 03/18/2022   Last lipids Lab Results  Component Value Date   CHOL 144 04/19/2022   HDL 46 04/19/2022    LDLCALC 91 04/19/2022   TRIG 28 04/19/2022   CHOLHDL 3.1 04/19/2022   Last hemoglobin A1c Lab Results  Component Value Date   HGBA1C 5.5 04/19/2022   Last thyroid functions Lab Results  Component Value Date   TSH 1.020 04/19/2022       Objective    BP (!) 171/86   Pulse 80   Temp 98.1 F (36.7 C)   Ht 6' 0.84" (1.85 m)   Wt (!) 304 lb 6.4 oz (138.1 kg)   SpO2 96%   BMI 40.34 kg/m  BP Readings from Last 3 Encounters:  04/27/22 (!) 171/86  04/20/22 (!) 111/93  04/06/22 137/72    Wt Readings from Last 3 Encounters:  04/27/22 (!) 304 lb 6.4 oz (138.1 kg)  04/20/22 260 lb (117.9 kg)  04/06/22 278 lb 12.8 oz (126.5 kg)    Physical Exam Vitals and nursing note reviewed.  Constitutional:      Appearance: Normal appearance. He is well-developed.  HENT:     Head: Normocephalic and atraumatic.  Eyes:     Pupils: Pupils are equal, round, and reactive to light.  Cardiovascular:     Rate and Rhythm: Normal rate and regular rhythm.     Pulses: Normal pulses.     Heart sounds: Normal heart sounds.  Pulmonary:     Effort: Pulmonary effort is normal.     Breath sounds: Normal breath sounds.  Abdominal:     Palpations: Abdomen is soft.  Musculoskeletal:        General: Normal range of motion.     Cervical back: Normal range of motion and neck supple.  Lymphadenopathy:     Cervical: No cervical adenopathy.  Skin:    General: Skin is warm and dry.     Capillary Refill: Capillary refill takes less than 2 seconds.  Neurological:     General: No focal deficit present.     Mental Status: He is alert and oriented to person, place, and time. Mental status is at baseline.  Psychiatric:        Mood and Affect: Mood normal.        Behavior: Behavior normal.        Thought Content: Thought content normal.        Judgment: Judgment normal.      Assessment & Plan    1. Diabetic nephropathy associated with type 2 diabetes mellitus ([place]) Reviewed labs with the patient and  family member who is in the office with him today. Severe elevation of serum creatinine and BUN with eGFR 10. An urgent referral to nephrology was made today for further evaluation and treatment.  - Ambulatory referral to Nephrology  2. History of renal cell cancer The patient does have history of renal cell carcinoma. Part of left kidney has been removed.   3. Hypertension associated with diabetes ([place]) Stable. Continue bp medication as prescribed   4. Iron deficiency anemia, unspecified iron deficiency anemia type Mild iron deficiency anemia. This is likely due to nephropathy.   5. Seizure disorder ([place]) Continue anti-seizure medications. Follow up with neurology    Problem List Items Addressed This Visit       Cardiovascular and Mediastinum   Hypertension associated with diabetes ([place]) (Chronic)     Endocrine   Diabetic nephropathy associated with type 2 diabetes mellitus ([place]) - Primary   Relevant Orders   Ambulatory referral to Nephrology     Nervous and Auditory   Seizure disorder [facility])     Other   History of renal cell cancer   Iron deficiency anemia     Return in about 3 months (around 07/28/2022) for medicare wellness, check HgbA1c.         Ronnell Freshwater, NP  [facility] [phone] (phone) [phone] (fax)  [place]

## 2022-04-28 DIAGNOSIS — E1121 Type 2 diabetes mellitus with diabetic nephropathy: Secondary | ICD-10-CM | POA: Insufficient documentation

## 2022-04-28 DIAGNOSIS — D509 Iron deficiency anemia, unspecified: Secondary | ICD-10-CM | POA: Insufficient documentation

## 2022-05-01 DIAGNOSIS — M7989 Other specified soft tissue disorders: Secondary | ICD-10-CM | POA: Diagnosis not present

## 2022-05-01 DIAGNOSIS — Z6839 Body mass index (BMI) 39.0-39.9, adult: Secondary | ICD-10-CM | POA: Diagnosis not present

## 2022-05-01 DIAGNOSIS — R2232 Localized swelling, mass and lump, left upper limb: Secondary | ICD-10-CM | POA: Diagnosis not present

## 2022-05-01 DIAGNOSIS — N179 Acute kidney failure, unspecified: Secondary | ICD-10-CM | POA: Diagnosis not present

## 2022-05-01 DIAGNOSIS — N189 Chronic kidney disease, unspecified: Secondary | ICD-10-CM | POA: Diagnosis not present

## 2022-05-01 DIAGNOSIS — I509 Heart failure, unspecified: Secondary | ICD-10-CM | POA: Diagnosis not present

## 2022-05-01 DIAGNOSIS — Z85528 Personal history of other malignant neoplasm of kidney: Secondary | ICD-10-CM | POA: Diagnosis not present

## 2022-05-01 DIAGNOSIS — Z7982 Long term (current) use of aspirin: Secondary | ICD-10-CM | POA: Diagnosis not present

## 2022-05-01 DIAGNOSIS — I130 Hypertensive heart and chronic kidney disease with heart failure and stage 1 through stage 4 chronic kidney disease, or unspecified chronic kidney disease: Secondary | ICD-10-CM | POA: Diagnosis not present

## 2022-05-01 DIAGNOSIS — R0602 Shortness of breath: Secondary | ICD-10-CM | POA: Diagnosis not present

## 2022-05-01 DIAGNOSIS — N200 Calculus of kidney: Secondary | ICD-10-CM | POA: Diagnosis not present

## 2022-05-03 ENCOUNTER — Encounter: Payer: Self-pay | Admitting: Nurse Practitioner

## 2022-05-11 ENCOUNTER — Telehealth: Payer: Self-pay | Admitting: Nurse Practitioner

## 2022-05-11 NOTE — Telephone Encounter (Signed)
Called Nephrology office they stated they will be calling the pt soon  Zoloft is not prescribe by the provider

## 2022-05-11 NOTE — Telephone Encounter (Signed)
Patient has a referral put in for nephrology but they have not reached out to patient. Can you please check on this for them, they said it's urgent? Also request refill of Zoloft. Please advise.

## 2022-05-12 ENCOUNTER — Telehealth: Payer: Self-pay | Admitting: Nurse Practitioner

## 2022-05-12 NOTE — Telephone Encounter (Signed)
Patient is new to [facility] and seeing her now as his pcp and he is out of his Zoloft. Can this be sent in?

## 2022-05-13 NOTE — Telephone Encounter (Signed)
Called pt spoke to [place] she is advised of the Rx that cannot be prescribe at this time due to another provider I stated that she can reach out to the office when [place] return

## 2022-05-15 ENCOUNTER — Other Ambulatory Visit: Payer: Self-pay | Admitting: Nurse Practitioner

## 2022-05-15 DIAGNOSIS — F339 Major depressive disorder, recurrent, unspecified: Secondary | ICD-10-CM

## 2022-05-15 MED ORDER — SERTRALINE HCL 25 MG PO TABS: 25.0000 mg | ORAL_TABLET | Freq: Every day | ORAL | 2 refills | Status: DC

## 2022-05-15 NOTE — Telephone Encounter (Signed)
Sent prescription for sertraline '25mg'$  daily to [facility] [address].

## 2022-05-25 DIAGNOSIS — I120 Hypertensive chronic kidney disease with stage 5 chronic kidney disease or end stage renal disease: Secondary | ICD-10-CM | POA: Diagnosis not present

## 2022-05-25 DIAGNOSIS — N184 Chronic kidney disease, stage 4 (severe): Secondary | ICD-10-CM | POA: Diagnosis not present

## 2022-05-25 DIAGNOSIS — N185 Chronic kidney disease, stage 5: Secondary | ICD-10-CM | POA: Diagnosis not present

## 2022-05-25 DIAGNOSIS — D631 Anemia in chronic kidney disease: Secondary | ICD-10-CM | POA: Diagnosis not present

## 2022-05-25 DIAGNOSIS — Z85528 Personal history of other malignant neoplasm of kidney: Secondary | ICD-10-CM | POA: Diagnosis not present

## 2022-05-25 DIAGNOSIS — E1122 Type 2 diabetes mellitus with diabetic chronic kidney disease: Secondary | ICD-10-CM | POA: Diagnosis not present

## 2022-05-28 ENCOUNTER — Encounter: Payer: Self-pay | Admitting: Nurse Practitioner

## 2022-05-30 ENCOUNTER — Other Ambulatory Visit: Payer: Self-pay | Admitting: Nurse Practitioner

## 2022-05-30 DIAGNOSIS — F339 Major depressive disorder, recurrent, unspecified: Secondary | ICD-10-CM

## 2022-05-30 MED ORDER — SERTRALINE HCL 100 MG PO TABS: 100.0000 mg | ORAL_TABLET | Freq: Every day | ORAL | 1 refills | Status: DC

## 2022-06-03 DIAGNOSIS — H16223 Keratoconjunctivitis sicca, not specified as Sjogren's, bilateral: Secondary | ICD-10-CM | POA: Diagnosis not present

## 2022-06-03 DIAGNOSIS — H2513 Age-related nuclear cataract, bilateral: Secondary | ICD-10-CM | POA: Diagnosis not present

## 2022-06-03 DIAGNOSIS — H10413 Chronic giant papillary conjunctivitis, bilateral: Secondary | ICD-10-CM | POA: Diagnosis not present

## 2022-06-16 DIAGNOSIS — N185 Chronic kidney disease, stage 5: Secondary | ICD-10-CM | POA: Diagnosis not present

## 2022-06-16 DIAGNOSIS — R809 Proteinuria, unspecified: Secondary | ICD-10-CM | POA: Diagnosis not present

## 2022-06-16 DIAGNOSIS — E1122 Type 2 diabetes mellitus with diabetic chronic kidney disease: Secondary | ICD-10-CM | POA: Diagnosis not present

## 2022-06-16 DIAGNOSIS — N189 Chronic kidney disease, unspecified: Secondary | ICD-10-CM | POA: Diagnosis not present

## 2022-06-16 DIAGNOSIS — I120 Hypertensive chronic kidney disease with stage 5 chronic kidney disease or end stage renal disease: Secondary | ICD-10-CM | POA: Diagnosis not present

## 2022-06-16 DIAGNOSIS — D631 Anemia in chronic kidney disease: Secondary | ICD-10-CM | POA: Diagnosis not present

## 2022-06-16 DIAGNOSIS — N2581 Secondary hyperparathyroidism of renal origin: Secondary | ICD-10-CM | POA: Diagnosis not present

## 2022-06-28 ENCOUNTER — Ambulatory Visit (HOSPITAL_COMMUNITY)
Admission: RE | Admit: 2022-06-28 | Discharge: 2022-06-28 | Disposition: A | Discharge status: Home / Self Care | Payer: Medicare Other | Source: Ambulatory Visit | Attending: Nephrology | Admitting: Nephrology

## 2022-06-28 VITALS — BP 157/75 | HR 58 | Temp 97.8°F | Resp 19

## 2022-06-28 DIAGNOSIS — E1122 Type 2 diabetes mellitus with diabetic chronic kidney disease: Secondary | ICD-10-CM | POA: Insufficient documentation

## 2022-06-28 DIAGNOSIS — N1831 Chronic kidney disease, stage 3a: Secondary | ICD-10-CM | POA: Diagnosis not present

## 2022-06-28 DIAGNOSIS — N185 Chronic kidney disease, stage 5: Secondary | ICD-10-CM | POA: Insufficient documentation

## 2022-06-28 DIAGNOSIS — Z794 Long term (current) use of insulin: Secondary | ICD-10-CM | POA: Insufficient documentation

## 2022-06-28 DIAGNOSIS — N1832 Chronic kidney disease, stage 3b: Secondary | ICD-10-CM | POA: Diagnosis not present

## 2022-06-28 DIAGNOSIS — D631 Anemia in chronic kidney disease: Secondary | ICD-10-CM | POA: Diagnosis not present

## 2022-06-28 DIAGNOSIS — D509 Iron deficiency anemia, unspecified: Secondary | ICD-10-CM | POA: Insufficient documentation

## 2022-06-28 LAB — POCT HEMOGLOBIN-HEMACUE: Hemoglobin: 8.2 g/dL — ABNORMAL LOW (ref 13.0–17.0)

## 2022-06-28 MED ORDER — EPOETIN ALFA 10000 UNIT/ML IJ SOLN
INTRAMUSCULAR | Status: AC
  Administered 2022-06-28: 10000 [IU] via SUBCUTANEOUS
  Filled 2022-06-28: qty 1

## 2022-06-28 MED ORDER — EPOETIN ALFA-EPBX 10000 UNIT/ML IJ SOLN
10000.0000 [IU] | INTRAMUSCULAR | Status: DC
  Filled 2022-06-28: qty 1

## 2022-06-28 MED ORDER — EPOETIN ALFA 10000 UNIT/ML IJ SOLN: 10000.0000 [IU] | Freq: Once | INTRAMUSCULAR | Status: DC

## 2022-06-30 DIAGNOSIS — N185 Chronic kidney disease, stage 5: Secondary | ICD-10-CM | POA: Diagnosis not present

## 2022-06-30 DIAGNOSIS — D631 Anemia in chronic kidney disease: Secondary | ICD-10-CM | POA: Diagnosis not present

## 2022-06-30 DIAGNOSIS — R809 Proteinuria, unspecified: Secondary | ICD-10-CM | POA: Diagnosis not present

## 2022-06-30 DIAGNOSIS — E1122 Type 2 diabetes mellitus with diabetic chronic kidney disease: Secondary | ICD-10-CM | POA: Diagnosis not present

## 2022-06-30 DIAGNOSIS — N2581 Secondary hyperparathyroidism of renal origin: Secondary | ICD-10-CM | POA: Diagnosis not present

## 2022-06-30 DIAGNOSIS — I120 Hypertensive chronic kidney disease with stage 5 chronic kidney disease or end stage renal disease: Secondary | ICD-10-CM | POA: Diagnosis not present

## 2022-07-12 ENCOUNTER — Encounter (HOSPITAL_COMMUNITY): Payer: Medicare Other

## 2022-07-13 ENCOUNTER — Encounter (HOSPITAL_COMMUNITY)
Admission: RE | Admit: 2022-07-13 | Discharge: 2022-07-13 | Disposition: A | Discharge status: Home / Self Care | Payer: Medicare Other | Source: Ambulatory Visit | Attending: Nephrology | Admitting: Nephrology

## 2022-07-13 VITALS — BP 152/81 | HR 60 | Temp 97.8°F | Resp 17

## 2022-07-13 DIAGNOSIS — Z794 Long term (current) use of insulin: Secondary | ICD-10-CM | POA: Diagnosis not present

## 2022-07-13 DIAGNOSIS — E1122 Type 2 diabetes mellitus with diabetic chronic kidney disease: Secondary | ICD-10-CM | POA: Insufficient documentation

## 2022-07-13 DIAGNOSIS — N1831 Chronic kidney disease, stage 3a: Secondary | ICD-10-CM | POA: Diagnosis present

## 2022-07-13 DIAGNOSIS — D631 Anemia in chronic kidney disease: Secondary | ICD-10-CM | POA: Diagnosis not present

## 2022-07-13 DIAGNOSIS — N1832 Chronic kidney disease, stage 3b: Secondary | ICD-10-CM | POA: Insufficient documentation

## 2022-07-13 DIAGNOSIS — D509 Iron deficiency anemia, unspecified: Secondary | ICD-10-CM | POA: Diagnosis present

## 2022-07-13 DIAGNOSIS — N185 Chronic kidney disease, stage 5: Secondary | ICD-10-CM | POA: Insufficient documentation

## 2022-07-13 LAB — POCT HEMOGLOBIN-HEMACUE: Hemoglobin: 9.1 g/dL — ABNORMAL LOW (ref 13.0–17.0)

## 2022-07-13 MED ORDER — EPOETIN ALFA-EPBX 10000 UNIT/ML IJ SOLN: 10000.0000 [IU] | INTRAMUSCULAR | Status: DC

## 2022-07-13 MED ORDER — EPOETIN ALFA 10000 UNIT/ML IJ SOLN
INTRAMUSCULAR | Status: AC
  Administered 2022-07-13: 10000 [IU] via SUBCUTANEOUS
  Filled 2022-07-13: qty 1

## 2022-07-13 MED ORDER — EPOETIN ALFA 10000 UNIT/ML IJ SOLN: 10000.0000 [IU] | INTRAMUSCULAR | Status: DC

## 2022-07-26 ENCOUNTER — Encounter (HOSPITAL_COMMUNITY): Payer: Medicare Other

## 2022-07-27 ENCOUNTER — Encounter (HOSPITAL_COMMUNITY)
Admission: RE | Admit: 2022-07-27 | Discharge: 2022-07-27 | Disposition: A | Discharge status: Home / Self Care | Payer: Medicare Other | Source: Ambulatory Visit | Attending: Nephrology | Admitting: Nephrology

## 2022-07-27 VITALS — BP 153/73 | HR 61 | Temp 97.5°F | Resp 18

## 2022-07-27 DIAGNOSIS — N1831 Chronic kidney disease, stage 3a: Secondary | ICD-10-CM

## 2022-07-27 DIAGNOSIS — N1832 Chronic kidney disease, stage 3b: Secondary | ICD-10-CM | POA: Diagnosis not present

## 2022-07-27 DIAGNOSIS — N185 Chronic kidney disease, stage 5: Secondary | ICD-10-CM | POA: Diagnosis not present

## 2022-07-27 DIAGNOSIS — E1122 Type 2 diabetes mellitus with diabetic chronic kidney disease: Secondary | ICD-10-CM | POA: Diagnosis not present

## 2022-07-27 DIAGNOSIS — D509 Iron deficiency anemia, unspecified: Secondary | ICD-10-CM

## 2022-07-27 DIAGNOSIS — Z794 Long term (current) use of insulin: Secondary | ICD-10-CM | POA: Diagnosis not present

## 2022-07-27 DIAGNOSIS — D631 Anemia in chronic kidney disease: Secondary | ICD-10-CM | POA: Diagnosis not present

## 2022-07-27 LAB — IRON AND TIBC
Iron: 70 ug/dL (ref 45–182)
Saturation Ratios: 33 % (ref 17.9–39.5)
TIBC: 213 ug/dL — ABNORMAL LOW (ref 250–450)
UIBC: 143 ug/dL

## 2022-07-27 LAB — FERRITIN: Ferritin: 284 ng/mL (ref 24–336)

## 2022-07-27 MED ORDER — EPOETIN ALFA 10000 UNIT/ML IJ SOLN
INTRAMUSCULAR | Status: AC
  Administered 2022-07-27: 10000 [IU] via SUBCUTANEOUS
  Filled 2022-07-27: qty 1

## 2022-07-27 MED ORDER — EPOETIN ALFA 10000 UNIT/ML IJ SOLN: 10000.0000 [IU] | Freq: Once | INTRAMUSCULAR | Status: AC

## 2022-07-27 MED ORDER — EPOETIN ALFA-EPBX 10000 UNIT/ML IJ SOLN: 10000.0000 [IU] | INTRAMUSCULAR | Status: DC

## 2022-07-28 LAB — POCT HEMOGLOBIN-HEMACUE: Hemoglobin: 10.3 g/dL — ABNORMAL LOW (ref 13.0–17.0)

## 2022-08-01 ENCOUNTER — Ambulatory Visit (INDEPENDENT_AMBULATORY_CARE_PROVIDER_SITE_OTHER): Payer: Medicare Other | Admitting: Nurse Practitioner

## 2022-08-01 ENCOUNTER — Encounter: Payer: Self-pay | Admitting: Nurse Practitioner

## 2022-08-01 ENCOUNTER — Encounter: Payer: Medicare Other | Admitting: Nurse Practitioner

## 2022-08-01 VITALS — BP 139/70 | HR 69 | Ht 72.0 in | Wt 262.1 lb

## 2022-08-01 DIAGNOSIS — G40909 Epilepsy, unspecified, not intractable, without status epilepticus: Secondary | ICD-10-CM | POA: Diagnosis not present

## 2022-08-01 DIAGNOSIS — E785 Hyperlipidemia, unspecified: Secondary | ICD-10-CM | POA: Diagnosis not present

## 2022-08-01 DIAGNOSIS — E1121 Type 2 diabetes mellitus with diabetic nephropathy: Secondary | ICD-10-CM | POA: Diagnosis not present

## 2022-08-01 DIAGNOSIS — E1169 Type 2 diabetes mellitus with other specified complication: Secondary | ICD-10-CM | POA: Diagnosis not present

## 2022-08-01 DIAGNOSIS — E1159 Type 2 diabetes mellitus with other circulatory complications: Secondary | ICD-10-CM

## 2022-08-01 DIAGNOSIS — E1142 Type 2 diabetes mellitus with diabetic polyneuropathy: Secondary | ICD-10-CM | POA: Diagnosis not present

## 2022-08-01 DIAGNOSIS — I152 Hypertension secondary to endocrine disorders: Secondary | ICD-10-CM | POA: Diagnosis not present

## 2022-08-01 DIAGNOSIS — Z794 Long term (current) use of insulin: Secondary | ICD-10-CM

## 2022-08-01 DIAGNOSIS — Z0000 Encounter for general adult medical examination without abnormal findings: Secondary | ICD-10-CM | POA: Diagnosis not present

## 2022-08-01 DIAGNOSIS — Z23 Encounter for immunization: Secondary | ICD-10-CM | POA: Diagnosis not present

## 2022-08-01 LAB — POCT UA - MICROALBUMIN
Albumin/Creatinine Ratio, Urine, POC: 300
Creatinine, POC: 200 mg/dL
Microalbumin Ur, POC: 150 mg/L

## 2022-08-01 MED ORDER — ATORVASTATIN CALCIUM 40 MG PO TABS: 40.0000 mg | ORAL_TABLET | Freq: Every day | ORAL | 1 refills | Status: DC

## 2022-08-01 MED ORDER — AMLODIPINE BESYLATE 10 MG PO TABS: 20.0000 mg | ORAL_TABLET | Freq: Every day | ORAL | 1 refills | Status: DC

## 2022-08-01 NOTE — Progress Notes (Signed)
Subjective:   Brandon [name] is a 61 y.o. male who presents for Medicare Annual/Subsequent preventive examination. -blood sugars elevated. Most recent HgbA1c done 07/13/2022. It was 9.1.  --states that kidney doctor cut his lantus to 20 units daily. Blood sugars are very labile.  --urine microalbumin is abnormal. He does see nephrology  --does check his blood sugars three times daily. States that blood sugars are between 130 and 180 -blood pressure looks ok today. Kidney doctor increased his amlodipine to 2 times daily.  -he is now taking carvedilol also  -will ned to get progress notes from kidney doctor  -had to stop taking lyrica. Caused him to have severe swelling in his feet.   Review of Systems    Review of Systems  Constitutional:  Negative for chills, fever and malaise/fatigue.  HENT:  Negative for congestion, sinus pain and sore throat.   Eyes: Negative.   Respiratory:  Negative for cough, shortness of breath and wheezing.   Cardiovascular:  Negative for chest pain, palpitations and leg swelling.  Gastrointestinal:  Negative for constipation, diarrhea, nausea and vomiting.  Genitourinary: Negative.   Musculoskeletal:  Negative for myalgias.  Skin: Negative.   Neurological:  Positive for tremors, seizures and headaches. Negative for dizziness.  Endo/Heme/Allergies:  Does not bruise/bleed easily.       Uncontrolled blood sugars   Psychiatric/Behavioral:  Negative for depression. The patient is nervous/anxious.           Objective:    Today's Vitals   08/01/22 1428 08/01/22 1454  BP: (!) 169/91 139/70  Pulse: 69   SpO2: 95%   Weight: 262 lb 1.9 oz (118.9 kg)   Height: 6' (1.829 m)    Body mass index is 35.55 kg/m.     04/20/2022    7:41 PM 03/18/2022    9:09 AM 02/01/2022    2:38 PM 12/02/2021   11:00 AM 09/20/2021    9:00 AM 07/15/2020   10:28 AM 04/05/2020    9:19 PM  Advanced Directives  Does Patient Have a Medical Advance Directive? _0  No No   Would patient like information on creating a medical advance directive? No - Patient declined No - Patient declined  No - Patient declined No - Patient declined  No - Patient declined    Current Medications (verified) Outpatient Encounter Medications as of 08/01/2022  Medication Sig   amLODipine (NORVASC) 10 MG tablet Take 2 tablets (20 mg total) by mouth daily.   atorvastatin (LIPITOR) 40 MG tablet Take 1 tablet (40 mg total) by mouth daily.   blood glucose meter kit and supplies Dispense based on patient and insurance preference. Use up to four times daily as directed. (FOR ICD-9 250.00, 250.01). (Patient taking differently: 1 each by Other route See admin instructions. Dispense based on patient and insurance preference. Use up to four times daily as directed. (FOR ICD-9 250.00, 250.01).)   ezetimibe (ZETIA) 10 MG tablet Take 1 tablet (10 mg total) by mouth daily.   glucose blood (ONETOUCH ULTRA) test strip Blood sugar testing done TID for insulin dependant Type @ diabetes - E11.65   HYDROcodone-acetaminophen (NORCO) 5-325 MG tablet Take 1 tablet by mouth every 6 (six) hours as needed for moderate pain.   insulin aspart (NOVOLOG FLEXPEN) 100 UNIT/ML FlexPen Inject 2-10 Units into the skin 3 (three) times daily with meals. 2-10 units, Subcutaneous, 3 times daily with meals, If Glucose is 150-200=2 units, 201-250=4 units, 251-300=6 units, 301-350=8 units, 351-400=10 units  insulin aspart (NOVOLOG) 100 UNIT/ML FlexPen Inject 2-10 Units into the skin 3 (three) times daily with meals. If glucose 150-200 take 2 units, glucose 201-250 take 4 units, 251-300 take 6 units, glucose 301-350 take 8 units, glucose 351-400 take 10 units (Patient taking differently: Inject 2-10 Units into the skin 3 (three) times daily with meals. 2-10 units, Subcutaneous, 3 times daily with meals, If Glucose is 150-200=2 units, 201-250=4 units, 251-300=6 units, 301-350=8 units, 351-400=10 units)   insulin glargine (LANTUS) 100  UNIT/ML Solostar Pen Inject 40 Units into the skin daily. (Patient taking differently: Inject 20 Units into the skin at bedtime.)   Insulin Pen Needle (NOVOFINE PLUS PEN NEEDLE) 32G X 4 MM MISC Use as directed with insulin pen   Lancets (ACCU-CHEK SOFT TOUCH) lancets Use as instructed (Patient taking differently: 1 each by Other route as directed.)   levETIRAcetam (KEPPRA) 500 MG tablet Take 2 tablets (1,000 mg total) by mouth daily. (Patient taking differently: Take 500 mg by mouth in the morning and at bedtime.)   levETIRAcetam (KEPPRA) 500 MG tablet Take 500 mg by mouth 2 (two) times daily. 11/15   omeprazole (PRILOSEC) 40 MG capsule Take 1 capsule (40 mg total) by mouth daily. (Patient taking differently: Take 40 mg by mouth daily as needed (heartburn/indigestion).)   QUEtiapine (SEROQUEL) 25 MG tablet Take 1 tablet (25 mg total) by mouth at bedtime.   sertraline (ZOLOFT) 100 MG tablet Take 1 tablet (100 mg total) by mouth daily.   [DISCONTINUED] amLODipine (NORVASC) 10 MG tablet TAKE 1 TABLET BY MOUTH EVERY DAY (Patient taking differently: Take 20 mg by mouth daily.)   [DISCONTINUED] atorvastatin (LIPITOR) 40 MG tablet Take 1 tablet (40 mg total) by mouth daily.   [DISCONTINUED] atorvastatin (LIPITOR) 40 MG tablet Take 40 mg by mouth daily.   [DISCONTINUED] ezetimibe (ZETIA) 10 MG tablet Take 10 mg by mouth daily.   [DISCONTINUED] pregabalin (LYRICA) 50 MG capsule Take 1 capsule (50 mg total) by mouth 2 (two) times daily as needed. (Patient taking differently: Take 50 mg by mouth 2 (two) times daily as needed (nerve pain).)   [DISCONTINUED] QUEtiapine (SEROQUEL) 25 MG tablet Take 25 mg by mouth at bedtime.   No facility-administered encounter medications on file as of 08/01/2022.    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   left and right; cryoablation bil   Depression    BH Adm in [place] Depression   Diabetes mellitus    DKA prior hospitalization   Diverticulitis    s/p micorperforation Sept 2012-managed conservatively by Gen surgery   ED (erectile dysfunction)    Focal seizure ([place]) 11/2013   due to ischemic stroke   GERD (gastroesophageal reflux disease)    Hiatal hernia    Hyperlipidemia    Hypertension    Seizures ([place])    none since 2016, taking Keppra - maw   Sleep apnea    Thyroid disease    "weak thyroid" per MD   Wears glasses    Past Surgical History:  Procedure Laterality Date   COLONOSCOPY     IR RADIOLOGIST EVAL & MGMT  04/04/2017   KIDNEY SURGERY     ablation of renal cell CA - 12/28,  prior one was October [place]   TEE WITHOUT CARDIOVERSION N/A 11/19/2013   Procedure: TRANSESOPHAGEAL ECHOCARDIOGRAM (TEE);  Surgeon: Lelon Perla, MD;  Location: [facility] ENDOSCOPY;  Service: Cardiovascular;  Laterality: N/A;   Family History  Problem Relation Age of Onset   Diabetes Father    Heart disease Father    Pneumonia Mother    Prostate cancer Other    Stomach cancer Other    Breast cancer Sister    Colon cancer Neg Hx    Esophageal cancer Neg Hx    Rectal cancer Neg Hx    Social History   Socioeconomic History   Marital status: Single    Spouse name: Not on file   Number of children: 2   Years of education: college   Highest education level: Not on file  Occupational History   Occupation: disabled  Tobacco Use   Smoking status: Never   Smokeless tobacco: Never  Vaping Use   Vaping Use: Never used  Substance and Sexual Activity   Alcohol use: Not Currently    Comment: rarely   Drug use: No   Sexual activity: Never  Other Topics Concern   Not on file  Social History Narrative   Patient lives alone.   Education. Two years of college.   Not working.   Right handed.    Caffeine one soda daily. Drinks coffee    Social Determinants of Health   Financial Resource Strain: Low Risk  (08/01/2022)   Overall Financial Resource Strain (CARDIA)    Difficulty of Paying Living Expenses: Not very hard  Food Insecurity: No Food Insecurity (08/01/2022)   Hunger Vital Sign    Worried About Running Out of Food in the Last Year: Never true    Ran Out of Food in the Last Year: Never true  Transportation Needs: No Transportation Needs (08/01/2022)   PRAPARE - Hydrologist (Medical): No    Lack of Transportation (Non-Medical): No  Physical Activity: Insufficiently Active (08/01/2022)   Exercise Vital Sign    Days of Exercise per Week: 1 day    Minutes of Exercise per Session: 10 min  Stress: No Stress Concern Present (08/01/2022)   [place]    Feeling of Stress : Only a little  Social Connections: Socially Isolated (08/01/2022)   Social Connection and Isolation Panel [NHANES]    Frequency of Communication with Friends and Family: Once a week    Frequency of Social Gatherings with Friends and Family: Once a week    Attends Religious Services: 1 to 4 times per year    Active Member of [employer] or Organizations: No    Attends Archivist Meetings: Never    Marital Status: Never married    Tobacco Counseling Counseling given: Not Answered   Diabetic?yes    Activities of Daily Living    08/01/2022    2:52 PM 04/06/2022    3:19 PM  In your present state of health, do you have any difficulty performing the following activities:  Hearing? 1 1  Vision? 1 1  Difficulty concentrating or making decisions? 1 1  Walking or climbing stairs? 1 1  Dressing or bathing? 1 1  Doing errands, shopping? 1 1    Patient Care Team: Ronnell Freshwater, NP as PCP - General (Family Medicine)  Indicate any recent Medical Services you may have received from other than [facility] providers in  the past year (date  may be approximate).     Assessment:  1. Encounter for Medicare annual wellness exam Annual medicare wellness visit   2. Type 2 diabetes mellitus with diabetic polyneuropathy, with long-term current use of insulin (HCC) Recent HgbA1c 10.2. Abnormal urine microalbumin. will refer to endocrinology for further evaluation.  - Ambulatory referral to Endocrinology  - POCT UA - Microalbumin  3. Hypertension associated with diabetes ([place]) Bp  generally stable. Continue bp medication as prescribed. - - amLODipine (NORVASC) 10 MG tablet; Take 2 tablets (20 mg total) by mouth daily.  Dispense: 180 tablet; Refill: 1  4. Diabetic nephropathy associated with type 2 diabetes mellitus (HCC) Abnormal urine microalbumin. Patient does see nephrology.   5. Hyperlipidemia associated with type 2 diabetes mellitus ([place]) Conitnue atorvastatin and zetia as prescribed  - atorvastatin (LIPITOR) 40 MG tablet; Take 1 tablet (40 mg total) by mouth daily.  Dispense: 90 tablet; Refill: 1  6. Seizure disorder [facility]) Continue regular visits with neurology   7. Need for pneumococcal 20-valent conjugate vaccination Prevnar 20 vaccine administered during today's visit.  - Pneumococcal conjugate vaccine 20-valent (Prevnar 20)   Hearing/Vision screen No results found.   Depression Screen    08/01/2022    2:52 PM 04/27/2022    2:51 PM 04/06/2022    3:19 PM 01/09/2020   10:42 AM 04/24/2018    1:13 PM 12/02/2011    7:58 AM  PHQ 2/9 Scores  PHQ - 2 Score 2 0 _0 0  PHQ- 9 Score _1 Fall Risk    04/24/2018    1:13 PM 03/01/2018    9:08 AM 02/02/2017    9:16 AM  Fall Risk   Falls in the past year? Yes Yes Yes  Number falls in past yr: 2 or more 2 or more 1  Injury with Fall?  Yes No  Comment  hit my left side   Risk for fall due to :  Impaired balance/gait     FALL RISK PREVENTION PERTAINING TO THE HOME:  Any stairs in or around the home? Yes  If so, are there any without  handrails? No  Home free of loose throw rugs in walkways, pet beds, electrical cords, etc? Yes  Adequate lighting in your home to reduce risk of falls? Yes   ASSISTIVE DEVICES UTILIZED TO PREVENT FALLS:  Life alert? No  Use of a cane, walker or w/c? Yes  Grab bars in the bathroom? No  Shower chair or bench in shower? Yes  Elevated toilet seat or a handicapped toilet? Yes   TIMED UP AND GO:  Was the test performed? Yes .  Length of time to ambulate 10 feet: 15 sec.   Gait slow and steady without use of assistive device  Cognitive Function:        08/01/2022    2:35 PM  6CIT Screen  What Year? 0 points  What month? 0 points  What time? 0 points  Count back from 20 0 points  Months in reverse 0 points  Repeat phrase 4 points  Total Score 4 points    Immunizations Immunization History  Administered Date(s) Administered   Influenza,inj,Quad PF,6+ Mos 11/18/2013, 07/29/2014, 10/19/2015, 08/15/2016, 02/05/2018, 12/24/2020, 10/19/2021   Moderna SARS-COV2 Booster Vaccination 07/07/2021   PFIZER(Purple Top)SARS-COV-2 Vaccination 03/14/2020, 04/21/2020, 12/24/2020   PNEUMOCOCCAL CONJUGATE-20 08/01/2022   Pneumococcal Polysaccharide-23 08/15/2016   Td 10/19/2021   Tdap 08/15/2016    TDAP status: Up to date  Flu  Vaccine status: Due, Education has been provided regarding the importance of this vaccine. Advised may receive this vaccine at local pharmacy or Health Dept. Aware to provide a copy of the vaccination record if obtained from local pharmacy or Health Dept. Verbalized acceptance and understanding.  Pneumococcal vaccine status: Due, Education has been provided regarding the importance of this vaccine. Advised may receive this vaccine at local pharmacy or Health Dept. Aware to provide a copy of the vaccination record if obtained from local pharmacy or Health Dept. Verbalized acceptance and understanding.  Covid-19 vaccine status: Completed vaccines  Qualifies for Shingles  Vaccine? Yes   Zostavax completed No   Shingrix Completed?: No.    Education has been provided regarding the importance of this vaccine. Patient has been advised to call insurance company to determine out of pocket expense if they have not yet received this vaccine. Advised may also receive vaccine at local pharmacy or Health Dept. Verbalized acceptance and understanding.  Screening Tests Health Maintenance  Topic Date Due   Hepatitis C Screening  Never done   Zoster Vaccines- Shingrix (1 of 2) Never done   COVID-19 Vaccine (4 - Pfizer risk series) 09/01/2021   INFLUENZA VACCINE  07/05/2022   FOOT EXAM  10/19/2022   HEMOGLOBIN A1C  10/20/2022   OPHTHALMOLOGY EXAM  04/27/2023   URINE MICROALBUMIN  08/02/2023   COLONOSCOPY (Pts 45-20yr Insurance coverage will need to be confirmed)  06/16/2027   TETANUS/TDAP  10/20/2031   HIV Screening  Completed   HPV VACCINES  Aged Out    Health Maintenance  Health Maintenance Due  Topic Date Due   Hepatitis C Screening  Never done   Zoster Vaccines- Shingrix (1 of 2) Never done   COVID-19 Vaccine (4 - Pfizer risk series) 09/01/2021   INFLUENZA VACCINE  07/05/2022    Colorectal cancer screening: Type of screening: Colonoscopy. Completed 2018. Repeat every n/a years  Lung Cancer Screening: (Low Dose CT Chest recommended if Age 59-80years, 30 pack-year currently smoking OR have quit w/in 15years.) does not qualify.   Lung Cancer Screening Referral: no  Additional Screening:  Hepatitis C Screening: does not qualify; Completed patient declined  Vision Screening: Recommended annual ophthalmology exams for early detection of glaucoma and other disorders of the eye. Is the patient up to date with their annual eye exam?  Yes  Who is the provider or what is the name of the office in which the patient attends annual eye exams? Pt does not know If pt is not established with a provider, would they like to be referred to a provider to establish care?  No .   Dental Screening: Recommended annual dental exams for proper oral hygiene  Community Resource Referral / Chronic Care Management: CRR required this visit?  No   CCM required this visit?  No      Plan:     I have personally reviewed and noted the following in the patient's chart:   Medical and social history Use of alcohol, tobacco or illicit drugs  Current medications and supplements including opioid prescriptions. Patient is not currently taking opioid prescriptions. Functional ability and status Nutritional status Physical activity Advanced directives List of other physicians Hospitalizations, surgeries, and ER visits in previous 12 months Vitals Screenings to include cognitive, depression, and falls Referrals and appointments  In addition, I have reviewed and discussed with patient certain preventive protocols, quality metrics, and best practice recommendations. A written personalized care plan for preventive services as well as general  preventive health recommendations were provided to patient.     Ronnell Freshwater, NP   08/14/2022

## 2022-08-10 ENCOUNTER — Encounter (HOSPITAL_COMMUNITY)
Admission: RE | Admit: 2022-08-10 | Discharge: 2022-08-10 | Disposition: A | Discharge status: Home / Self Care | Payer: Medicare Other | Source: Ambulatory Visit | Attending: Nephrology | Admitting: Nephrology

## 2022-08-10 VITALS — BP 156/92 | HR 66 | Temp 98.4°F | Resp 18

## 2022-08-10 DIAGNOSIS — N1832 Chronic kidney disease, stage 3b: Secondary | ICD-10-CM | POA: Insufficient documentation

## 2022-08-10 DIAGNOSIS — Z794 Long term (current) use of insulin: Secondary | ICD-10-CM | POA: Insufficient documentation

## 2022-08-10 DIAGNOSIS — E1122 Type 2 diabetes mellitus with diabetic chronic kidney disease: Secondary | ICD-10-CM | POA: Diagnosis not present

## 2022-08-10 DIAGNOSIS — D631 Anemia in chronic kidney disease: Secondary | ICD-10-CM | POA: Insufficient documentation

## 2022-08-10 DIAGNOSIS — N1831 Chronic kidney disease, stage 3a: Secondary | ICD-10-CM | POA: Insufficient documentation

## 2022-08-10 DIAGNOSIS — D509 Iron deficiency anemia, unspecified: Secondary | ICD-10-CM | POA: Diagnosis not present

## 2022-08-10 MED ORDER — EPOETIN ALFA-EPBX 10000 UNIT/ML IJ SOLN
INTRAMUSCULAR | Status: AC
  Administered 2022-08-10: 10000 [IU] via SUBCUTANEOUS
  Filled 2022-08-10: qty 1

## 2022-08-10 MED ORDER — EPOETIN ALFA-EPBX 10000 UNIT/ML IJ SOLN: 10000.0000 [IU] | INTRAMUSCULAR | Status: DC

## 2022-08-18 LAB — POCT HEMOGLOBIN-HEMACUE: Hemoglobin: 9.1 g/dL — ABNORMAL LOW (ref 13.0–17.0)

## 2022-08-22 ENCOUNTER — Other Ambulatory Visit: Payer: Self-pay | Admitting: Medical

## 2022-08-22 DIAGNOSIS — I152 Hypertension secondary to endocrine disorders: Secondary | ICD-10-CM

## 2022-08-24 ENCOUNTER — Encounter (HOSPITAL_COMMUNITY): Payer: Medicare Other

## 2022-09-07 ENCOUNTER — Encounter (HOSPITAL_COMMUNITY): Payer: Medicare Other

## 2022-09-19 ENCOUNTER — Other Ambulatory Visit: Payer: Self-pay | Admitting: Nurse Practitioner

## 2022-09-19 DIAGNOSIS — E1142 Type 2 diabetes mellitus with diabetic polyneuropathy: Secondary | ICD-10-CM

## 2022-09-23 ENCOUNTER — Ambulatory Visit (HOSPITAL_COMMUNITY)
Admission: RE | Admit: 2022-09-23 | Discharge: 2022-09-23 | Disposition: A | Discharge status: Home / Self Care | Payer: Medicare Other | Source: Ambulatory Visit | Attending: Nephrology | Admitting: Nephrology

## 2022-09-23 VITALS — BP 166/89 | HR 59 | Temp 97.3°F | Resp 20

## 2022-09-23 DIAGNOSIS — Z794 Long term (current) use of insulin: Secondary | ICD-10-CM | POA: Diagnosis not present

## 2022-09-23 DIAGNOSIS — N185 Chronic kidney disease, stage 5: Secondary | ICD-10-CM | POA: Diagnosis not present

## 2022-09-23 DIAGNOSIS — D509 Iron deficiency anemia, unspecified: Secondary | ICD-10-CM | POA: Diagnosis not present

## 2022-09-23 DIAGNOSIS — N186 End stage renal disease: Secondary | ICD-10-CM | POA: Insufficient documentation

## 2022-09-23 DIAGNOSIS — E1122 Type 2 diabetes mellitus with diabetic chronic kidney disease: Secondary | ICD-10-CM | POA: Diagnosis not present

## 2022-09-23 DIAGNOSIS — D631 Anemia in chronic kidney disease: Secondary | ICD-10-CM | POA: Insufficient documentation

## 2022-09-23 DIAGNOSIS — N1832 Chronic kidney disease, stage 3b: Secondary | ICD-10-CM | POA: Diagnosis present

## 2022-09-23 DIAGNOSIS — N1831 Chronic kidney disease, stage 3a: Secondary | ICD-10-CM | POA: Diagnosis not present

## 2022-09-23 LAB — POCT HEMOGLOBIN-HEMACUE: Hemoglobin: 9.4 g/dL — ABNORMAL LOW (ref 13.0–17.0)

## 2022-09-23 MED ORDER — EPOETIN ALFA-EPBX 10000 UNIT/ML IJ SOLN
INTRAMUSCULAR | Status: AC
  Filled 2022-09-23: qty 1

## 2022-09-23 MED ORDER — EPOETIN ALFA-EPBX 10000 UNIT/ML IJ SOLN
10000.0000 [IU] | INTRAMUSCULAR | Status: DC
  Administered 2022-09-23: 10000 [IU] via SUBCUTANEOUS

## 2022-10-02 ENCOUNTER — Emergency Department (HOSPITAL_COMMUNITY): Payer: Medicare Other

## 2022-10-02 ENCOUNTER — Inpatient Hospital Stay (HOSPITAL_COMMUNITY)
Admission: EM | Admit: 2022-10-02 | Discharge: 2022-10-16 | DRG: 673 | Disposition: A | Discharge status: Home Health | Payer: Medicare Other | Attending: Internal Medicine | Admitting: Internal Medicine

## 2022-10-02 ENCOUNTER — Encounter (HOSPITAL_COMMUNITY): Payer: Self-pay | Admitting: Emergency Medicine

## 2022-10-02 ENCOUNTER — Other Ambulatory Visit: Payer: Self-pay

## 2022-10-02 DIAGNOSIS — Z803 Family history of malignant neoplasm of breast: Secondary | ICD-10-CM

## 2022-10-02 DIAGNOSIS — N179 Acute kidney failure, unspecified: Secondary | ICD-10-CM | POA: Diagnosis not present

## 2022-10-02 DIAGNOSIS — I5033 Acute on chronic diastolic (congestive) heart failure: Secondary | ICD-10-CM | POA: Diagnosis present

## 2022-10-02 DIAGNOSIS — Z8042 Family history of malignant neoplasm of prostate: Secondary | ICD-10-CM

## 2022-10-02 DIAGNOSIS — I69354 Hemiplegia and hemiparesis following cerebral infarction affecting left non-dominant side: Secondary | ICD-10-CM

## 2022-10-02 DIAGNOSIS — J9601 Acute respiratory failure with hypoxia: Secondary | ICD-10-CM | POA: Diagnosis present

## 2022-10-02 DIAGNOSIS — Z20822 Contact with and (suspected) exposure to covid-19: Secondary | ICD-10-CM | POA: Diagnosis present

## 2022-10-02 DIAGNOSIS — R0602 Shortness of breath: Secondary | ICD-10-CM | POA: Diagnosis not present

## 2022-10-02 DIAGNOSIS — Z79899 Other long term (current) drug therapy: Secondary | ICD-10-CM | POA: Diagnosis not present

## 2022-10-02 DIAGNOSIS — I132 Hypertensive heart and chronic kidney disease with heart failure and with stage 5 chronic kidney disease, or end stage renal disease: Secondary | ICD-10-CM | POA: Diagnosis present

## 2022-10-02 DIAGNOSIS — R918 Other nonspecific abnormal finding of lung field: Secondary | ICD-10-CM | POA: Diagnosis not present

## 2022-10-02 DIAGNOSIS — Z889 Allergy status to unspecified drugs, medicaments and biological substances status: Secondary | ICD-10-CM

## 2022-10-02 DIAGNOSIS — N184 Chronic kidney disease, stage 4 (severe): Secondary | ICD-10-CM | POA: Diagnosis present

## 2022-10-02 DIAGNOSIS — F339 Major depressive disorder, recurrent, unspecified: Secondary | ICD-10-CM | POA: Diagnosis not present

## 2022-10-02 DIAGNOSIS — D631 Anemia in chronic kidney disease: Secondary | ICD-10-CM | POA: Diagnosis present

## 2022-10-02 DIAGNOSIS — I509 Heart failure, unspecified: Secondary | ICD-10-CM | POA: Diagnosis not present

## 2022-10-02 DIAGNOSIS — F840 Autistic disorder: Secondary | ICD-10-CM | POA: Diagnosis present

## 2022-10-02 DIAGNOSIS — Z800 Family history of malignant neoplasm of digestive organs: Secondary | ICD-10-CM

## 2022-10-02 DIAGNOSIS — Z992 Dependence on renal dialysis: Secondary | ICD-10-CM | POA: Diagnosis not present

## 2022-10-02 DIAGNOSIS — Z794 Long term (current) use of insulin: Secondary | ICD-10-CM | POA: Diagnosis not present

## 2022-10-02 DIAGNOSIS — I120 Hypertensive chronic kidney disease with stage 5 chronic kidney disease or end stage renal disease: Secondary | ICD-10-CM | POA: Diagnosis not present

## 2022-10-02 DIAGNOSIS — E1169 Type 2 diabetes mellitus with other specified complication: Secondary | ICD-10-CM | POA: Diagnosis present

## 2022-10-02 DIAGNOSIS — Z833 Family history of diabetes mellitus: Secondary | ICD-10-CM

## 2022-10-02 DIAGNOSIS — G9341 Metabolic encephalopathy: Secondary | ICD-10-CM | POA: Diagnosis present

## 2022-10-02 DIAGNOSIS — G40909 Epilepsy, unspecified, not intractable, without status epilepticus: Secondary | ICD-10-CM

## 2022-10-02 DIAGNOSIS — Z6841 Body Mass Index (BMI) 40.0 and over, adult: Secondary | ICD-10-CM | POA: Diagnosis not present

## 2022-10-02 DIAGNOSIS — E669 Obesity, unspecified: Secondary | ICD-10-CM | POA: Diagnosis not present

## 2022-10-02 DIAGNOSIS — I152 Hypertension secondary to endocrine disorders: Secondary | ICD-10-CM

## 2022-10-02 DIAGNOSIS — I5043 Acute on chronic combined systolic (congestive) and diastolic (congestive) heart failure: Secondary | ICD-10-CM | POA: Diagnosis present

## 2022-10-02 DIAGNOSIS — Z885 Allergy status to narcotic agent status: Secondary | ICD-10-CM

## 2022-10-02 DIAGNOSIS — J9811 Atelectasis: Secondary | ICD-10-CM | POA: Diagnosis present

## 2022-10-02 DIAGNOSIS — I6930 Unspecified sequelae of cerebral infarction: Secondary | ICD-10-CM | POA: Diagnosis not present

## 2022-10-02 DIAGNOSIS — R4189 Other symptoms and signs involving cognitive functions and awareness: Secondary | ICD-10-CM | POA: Diagnosis not present

## 2022-10-02 DIAGNOSIS — E119 Type 2 diabetes mellitus without complications: Secondary | ICD-10-CM

## 2022-10-02 DIAGNOSIS — E039 Hypothyroidism, unspecified: Secondary | ICD-10-CM | POA: Diagnosis present

## 2022-10-02 DIAGNOSIS — R0600 Dyspnea, unspecified: Secondary | ICD-10-CM | POA: Diagnosis not present

## 2022-10-02 DIAGNOSIS — F79 Unspecified intellectual disabilities: Secondary | ICD-10-CM | POA: Diagnosis present

## 2022-10-02 DIAGNOSIS — N185 Chronic kidney disease, stage 5: Secondary | ICD-10-CM

## 2022-10-02 DIAGNOSIS — I160 Hypertensive urgency: Secondary | ICD-10-CM | POA: Diagnosis not present

## 2022-10-02 DIAGNOSIS — Z23 Encounter for immunization: Secondary | ICD-10-CM | POA: Diagnosis not present

## 2022-10-02 DIAGNOSIS — I69398 Other sequelae of cerebral infarction: Secondary | ICD-10-CM

## 2022-10-02 DIAGNOSIS — Z888 Allergy status to other drugs, medicaments and biological substances status: Secondary | ICD-10-CM

## 2022-10-02 DIAGNOSIS — Z85528 Personal history of other malignant neoplasm of kidney: Secondary | ICD-10-CM

## 2022-10-02 DIAGNOSIS — Z8249 Family history of ischemic heart disease and other diseases of the circulatory system: Secondary | ICD-10-CM

## 2022-10-02 DIAGNOSIS — N189 Chronic kidney disease, unspecified: Secondary | ICD-10-CM | POA: Diagnosis not present

## 2022-10-02 DIAGNOSIS — I130 Hypertensive heart and chronic kidney disease with heart failure and stage 1 through stage 4 chronic kidney disease, or unspecified chronic kidney disease: Secondary | ICD-10-CM | POA: Diagnosis not present

## 2022-10-02 DIAGNOSIS — I129 Hypertensive chronic kidney disease with stage 1 through stage 4 chronic kidney disease, or unspecified chronic kidney disease: Secondary | ICD-10-CM | POA: Diagnosis not present

## 2022-10-02 DIAGNOSIS — E8779 Other fluid overload: Secondary | ICD-10-CM | POA: Diagnosis not present

## 2022-10-02 DIAGNOSIS — Z789 Other specified health status: Secondary | ICD-10-CM | POA: Diagnosis not present

## 2022-10-02 DIAGNOSIS — G4733 Obstructive sleep apnea (adult) (pediatric): Secondary | ICD-10-CM | POA: Diagnosis present

## 2022-10-02 DIAGNOSIS — F419 Anxiety disorder, unspecified: Secondary | ICD-10-CM | POA: Diagnosis present

## 2022-10-02 DIAGNOSIS — E1122 Type 2 diabetes mellitus with diabetic chronic kidney disease: Secondary | ICD-10-CM | POA: Diagnosis not present

## 2022-10-02 DIAGNOSIS — N186 End stage renal disease: Secondary | ICD-10-CM | POA: Diagnosis not present

## 2022-10-02 DIAGNOSIS — E785 Hyperlipidemia, unspecified: Secondary | ICD-10-CM | POA: Diagnosis present

## 2022-10-02 DIAGNOSIS — R079 Chest pain, unspecified: Secondary | ICD-10-CM | POA: Diagnosis not present

## 2022-10-02 DIAGNOSIS — I2489 Other forms of acute ischemic heart disease: Secondary | ICD-10-CM | POA: Diagnosis not present

## 2022-10-02 DIAGNOSIS — Z4901 Encounter for fitting and adjustment of extracorporeal dialysis catheter: Secondary | ICD-10-CM | POA: Diagnosis not present

## 2022-10-02 DIAGNOSIS — N250 Renal osteodystrophy: Secondary | ICD-10-CM | POA: Diagnosis present

## 2022-10-02 DIAGNOSIS — Z515 Encounter for palliative care: Secondary | ICD-10-CM | POA: Diagnosis not present

## 2022-10-02 DIAGNOSIS — K219 Gastro-esophageal reflux disease without esophagitis: Secondary | ICD-10-CM | POA: Diagnosis present

## 2022-10-02 LAB — CBC WITH DIFFERENTIAL/PLATELET
Abs Immature Granulocytes: 0.01 10*3/uL (ref 0.00–0.07)
Basophils Absolute: 0 10*3/uL (ref 0.0–0.1)
Basophils Relative: 1 %
Eosinophils Absolute: 0.2 10*3/uL (ref 0.0–0.5)
Eosinophils Relative: 3 %
HCT: 29.7 % — ABNORMAL LOW (ref 39.0–52.0)
Hemoglobin: 9.7 g/dL — ABNORMAL LOW (ref 13.0–17.0)
Immature Granulocytes: 0 %
Lymphocytes Relative: 17 %
Lymphs Abs: 1 10*3/uL (ref 0.7–4.0)
MCH: 29 pg (ref 26.0–34.0)
MCHC: 32.7 g/dL (ref 30.0–36.0)
MCV: 88.9 fL (ref 80.0–100.0)
Monocytes Absolute: 0.6 10*3/uL (ref 0.1–1.0)
Monocytes Relative: 10 %
Neutro Abs: 4.1 10*3/uL (ref 1.7–7.7)
Neutrophils Relative %: 69 %
Platelets: 215 10*3/uL (ref 150–400)
RBC: 3.34 MIL/uL — ABNORMAL LOW (ref 4.22–5.81)
RDW: 14.1 % (ref 11.5–15.5)
WBC: 5.9 10*3/uL (ref 4.0–10.5)
nRBC: 0 % (ref 0.0–0.2)

## 2022-10-02 LAB — COMPREHENSIVE METABOLIC PANEL
ALT: 9 U/L (ref 0–44)
AST: 9 U/L — ABNORMAL LOW (ref 15–41)
Albumin: 3.4 g/dL — ABNORMAL LOW (ref 3.5–5.0)
Alkaline Phosphatase: 71 U/L (ref 38–126)
Anion gap: 13 (ref 5–15)
BUN: 47 mg/dL — ABNORMAL HIGH (ref 6–20)
CO2: 23 mmol/L (ref 22–32)
Calcium: 9 mg/dL (ref 8.9–10.3)
Chloride: 107 mmol/L (ref 98–111)
Creatinine, Ser: 11 mg/dL — ABNORMAL HIGH (ref 0.61–1.24)
GFR, Estimated: 5 mL/min — ABNORMAL LOW (ref >60)
Glucose, Bld: 120 mg/dL — ABNORMAL HIGH (ref 70–99)
Potassium: 5.1 mmol/L (ref 3.5–5.1)
Sodium: 143 mmol/L (ref 135–145)
Total Bilirubin: 1.5 mg/dL — ABNORMAL HIGH (ref 0.3–1.2)
Total Protein: 7.7 g/dL (ref 6.5–8.1)

## 2022-10-02 LAB — RESP PANEL BY RT-PCR (FLU A&B, COVID) ARPGX2
Influenza A by PCR: NEGATIVE
Influenza B by PCR: NEGATIVE
SARS Coronavirus 2 by RT PCR: NEGATIVE

## 2022-10-02 LAB — BRAIN NATRIURETIC PEPTIDE: B Natriuretic Peptide: 2179.1 pg/mL — ABNORMAL HIGH (ref 0.0–100.0)

## 2022-10-02 LAB — TROPONIN I (HIGH SENSITIVITY)
Troponin I (High Sensitivity): 27 ng/L — ABNORMAL HIGH (ref <18)
Troponin I (High Sensitivity): 29 ng/L — ABNORMAL HIGH (ref <18)

## 2022-10-02 LAB — LIPASE, BLOOD: Lipase: 28 U/L (ref 11–51)

## 2022-10-02 MED ORDER — HEPARIN SODIUM (PORCINE) 5000 UNIT/ML IJ SOLN
5000.0000 [IU] | Freq: Three times a day (TID) | INTRAMUSCULAR | Status: DC
  Administered 2022-10-02 – 2022-10-16 (×35): 5000 [IU] via SUBCUTANEOUS
  Filled 2022-10-02 (×34): qty 1

## 2022-10-02 MED ORDER — ATORVASTATIN CALCIUM 40 MG PO TABS
40.0000 mg | ORAL_TABLET | Freq: Every day | ORAL | Status: DC
  Administered 2022-10-02 – 2022-10-16 (×15): 40 mg via ORAL
  Filled 2022-10-02 (×15): qty 1

## 2022-10-02 MED ORDER — HYDROCODONE-ACETAMINOPHEN 5-325 MG PO TABS
1.0000 | ORAL_TABLET | Freq: Four times a day (QID) | ORAL | Status: DC | PRN
  Administered 2022-10-07 – 2022-10-16 (×12): 1 via ORAL
  Filled 2022-10-02 (×12): qty 1

## 2022-10-02 MED ORDER — SODIUM BICARBONATE 650 MG PO TABS
1300.0000 mg | ORAL_TABLET | Freq: Two times a day (BID) | ORAL | Status: DC
  Administered 2022-10-02 – 2022-10-09 (×14): 1300 mg via ORAL
  Filled 2022-10-02 (×15): qty 2

## 2022-10-02 MED ORDER — CARVEDILOL 25 MG PO TABS
25.0000 mg | ORAL_TABLET | Freq: Two times a day (BID) | ORAL | Status: DC
  Administered 2022-10-02 – 2022-10-16 (×26): 25 mg via ORAL
  Filled 2022-10-02 (×7): qty 1
  Filled 2022-10-02: qty 2
  Filled 2022-10-02 (×10): qty 1
  Filled 2022-10-02: qty 2
  Filled 2022-10-02 (×8): qty 1

## 2022-10-02 MED ORDER — FUROSEMIDE 10 MG/ML IJ SOLN
80.0000 mg | Freq: Once | INTRAMUSCULAR | Status: AC
  Administered 2022-10-02: 80 mg via INTRAVENOUS
  Filled 2022-10-02: qty 8

## 2022-10-02 MED ORDER — ALBUTEROL SULFATE (2.5 MG/3ML) 0.083% IN NEBU
2.5000 mg | INHALATION_SOLUTION | Freq: Four times a day (QID) | RESPIRATORY_TRACT | Status: DC | PRN
  Administered 2022-10-02: 2.5 mg via RESPIRATORY_TRACT

## 2022-10-02 MED ORDER — LABETALOL HCL 5 MG/ML IV SOLN
10.0000 mg | INTRAVENOUS | Status: DC | PRN
  Administered 2022-10-03: 10 mg via INTRAVENOUS
  Filled 2022-10-02 (×3): qty 4

## 2022-10-02 MED ORDER — FUROSEMIDE 10 MG/ML IJ SOLN
120.0000 mg | Freq: Two times a day (BID) | INTRAVENOUS | Status: DC
  Administered 2022-10-02 – 2022-10-07 (×10): 120 mg via INTRAVENOUS
  Filled 2022-10-02: qty 2
  Filled 2022-10-02 (×5): qty 10
  Filled 2022-10-02 (×4): qty 12
  Filled 2022-10-02: qty 10
  Filled 2022-10-02: qty 12
  Filled 2022-10-02 (×2): qty 10
  Filled 2022-10-02: qty 12

## 2022-10-02 MED ORDER — ACETAMINOPHEN 650 MG RE SUPP: 650.0000 mg | Freq: Four times a day (QID) | RECTAL | Status: DC | PRN

## 2022-10-02 MED ORDER — LEVETIRACETAM 500 MG PO TABS
500.0000 mg | ORAL_TABLET | Freq: Two times a day (BID) | ORAL | Status: DC
  Administered 2022-10-02 – 2022-10-16 (×27): 500 mg via ORAL
  Filled 2022-10-02 (×27): qty 1

## 2022-10-02 MED ORDER — SODIUM CHLORIDE 0.9% FLUSH
3.0000 mL | Freq: Two times a day (BID) | INTRAVENOUS | Status: DC
  Administered 2022-10-02 – 2022-10-16 (×25): 3 mL via INTRAVENOUS

## 2022-10-02 MED ORDER — SERTRALINE HCL 100 MG PO TABS
100.0000 mg | ORAL_TABLET | Freq: Every day | ORAL | Status: DC
  Administered 2022-10-02 – 2022-10-16 (×15): 100 mg via ORAL
  Filled 2022-10-02 (×17): qty 1

## 2022-10-02 MED ORDER — ACETAMINOPHEN 325 MG PO TABS
650.0000 mg | ORAL_TABLET | Freq: Four times a day (QID) | ORAL | Status: DC | PRN
  Administered 2022-10-14: 650 mg via ORAL
  Filled 2022-10-02: qty 2

## 2022-10-02 MED ORDER — AMLODIPINE BESYLATE 10 MG PO TABS
10.0000 mg | ORAL_TABLET | Freq: Every day | ORAL | Status: DC
  Administered 2022-10-02 – 2022-10-16 (×13): 10 mg via ORAL
  Filled 2022-10-02 (×6): qty 1
  Filled 2022-10-02: qty 2
  Filled 2022-10-02 (×7): qty 1

## 2022-10-02 MED ORDER — EZETIMIBE 10 MG PO TABS
10.0000 mg | ORAL_TABLET | Freq: Every day | ORAL | Status: DC
  Administered 2022-10-02 – 2022-10-16 (×15): 10 mg via ORAL
  Filled 2022-10-02 (×16): qty 1

## 2022-10-02 MED ORDER — ALBUTEROL SULFATE (2.5 MG/3ML) 0.083% IN NEBU
INHALATION_SOLUTION | RESPIRATORY_TRACT | Status: AC
  Filled 2022-10-02: qty 3
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
## 2022-10-02 NOTE — Consult Note (Signed)
[facility]  INPATIENT CONSULTATION  Reason for Consultation: CKD 5 Requesting Provider: Dr. Tamala Julian  HPI: Brandon [name] is an 61 y.o. male with CKD 5, DM,  h/o CVA complicated by seizures, bilateral RCCa s/p ablation (2017-18), OSA, HL, hypothroidism who is currently admitte for hypoxia and nephrology is consulted re: CKD and volume overload.   Pt is quite a poor historian - sounds like chronic edema but recently worse and progressing to orthopnea.  Presented to the ED yesterday -O2 88% on RA, imaging with BL small pleural effusion, atelectasis.  Labs with Na 143, K 5.1, Bicarb 23, BUN 47, Cr 11, GFR 5, Hb 9.7.  Says poor appetite, nausea, no hiccups, no confusion, no pruritis.   Receiving IV lasix.   He is unable to relate any info about CKD care but in reviewing his outpt records he follows at [facility]. GFR 12/2021 14 > 04/2022 10 > now GFR 5.  He was last seen 7/27 and had 2 wk f/u arranged, but despite efforts to contact him he hasn't followed up since then.  He does go to outpt ESA visits for anemia management.  Was referred to [facility] for access but hasn't had consultation.   PMH: Past Medical History:  Diagnosis Date   Acute ischemic stroke ([place]) 11/2013   Allergy    Anxiety    Arthritis    Benign hypertension with CKD (chronic kidney disease) stage III (HCC)    Bil Renal Ca dx'd 09/2011 & 11/2011   left and right; cryoablation bil   Depression    BH Adm in [place] Depression   Diabetes mellitus    DKA prior hospitalization   Diverticulitis    s/p micorperforation Sept 2012-managed conservatively by Gen surgery   ED (erectile dysfunction)    Focal seizure ([place]) 11/2013   due to ischemic stroke   GERD (gastroesophageal reflux disease)    Hiatal hernia    Hyperlipidemia    Hypertension    Seizures ([place])    none since 2016, taking Keppra - maw   Sleep apnea    Thyroid disease    "weak thyroid" per MD   Wears glasses    PSH: Past Surgical History:  Procedure  Laterality Date   COLONOSCOPY     IR RADIOLOGIST EVAL & MGMT  04/04/2017   KIDNEY SURGERY     ablation of renal cell CA - 12/28, prior one was October [place]   TEE WITHOUT CARDIOVERSION N/A 11/19/2013   Procedure: TRANSESOPHAGEAL ECHOCARDIOGRAM (TEE);  Surgeon: Lelon Perla, MD;  Location: [facility] ENDOSCOPY;  Service: Cardiovascular;  Laterality: N/A;     Past Medical History:  Diagnosis Date   Acute ischemic stroke ([place]) 11/2013   Allergy    Anxiety    Arthritis    Benign hypertension with CKD (chronic kidney disease) stage III ([place])    Bil Renal Ca dx'd 09/2011 & 11/2011   left and right; cryoablation bil   Depression    BH Adm in [place] Depression   Diabetes mellitus    DKA prior hospitalization   Diverticulitis    s/p micorperforation Sept 2012-managed conservatively by Gen surgery   ED (erectile dysfunction)    Focal seizure ([place]) 11/2013   due to ischemic stroke   GERD (gastroesophageal reflux disease)    Hiatal hernia    Hyperlipidemia    Hypertension    Seizures ([place])    none since 2016, taking Keppra - maw   Sleep apnea    Thyroid  disease    "weak thyroid" per MD   Wears glasses     Medications:  I have reviewed the patient's current medications.  (Not in a hospital admission)   ALLERGIES:   Allergies  Allergen Reactions   Hydralazine Other (See Comments)    Chest pain if by mouth- can tolerate it via IV   Imdur [Isosorbide Nitrate] Other (See Comments)    headaches   Morphine Itching    FAM HX: Family History  Problem Relation Age of Onset   Diabetes Father    Heart disease Father    Pneumonia Mother    Prostate cancer Other    Stomach cancer Other    Breast cancer Sister    Colon cancer Neg Hx    Esophageal cancer Neg Hx    Rectal cancer Neg Hx     Social History:   reports that he has never smoked. He has never used smokeless tobacco. He reports that he does not currently use alcohol. He reports that he does not use  drugs.  ROS: 12 system ROS neg except per above  Blood pressure (!) 156/112, pulse 62, temperature (!) 97.5 F (36.4 C), temperature source Oral, resp. rate (!) 22, SpO2 97 %. PHYSICAL EXAM: Gen: comfortable on stretcher at 90 degrees  Eyes: anicteric ENT: MMM Neck: elevated JVD upright CV: RRR, no rub Abd:  soft, obese Lungs: normal WOB on 2L with conversation, dec BS bases and rales in mid lungs GU: no foley Extr:  2+ pitting edema to mid thigh Neuro: no asterixis, awake and alert, conversant, Skin: L tibial area with shallow scabbed lesion - no erythema or drainage   Results for orders placed or performed during the hospital encounter of 10/02/22 (from the past 48 hour(s))  Resp Panel by RT-PCR (Flu A&B, Covid) Anterior Nasal Swab     Status: None   Collection Time: 10/02/22  4:06 AM   Specimen: Anterior Nasal Swab  Result Value Ref Range   SARS Coronavirus 2 by RT PCR NEGATIVE NEGATIVE    Comment: (NOTE) SARS-CoV-2 target nucleic acids are NOT DETECTED.  The SARS-CoV-2 RNA is generally detectable in upper respiratory specimens during the acute phase of infection. The lowest concentration of SARS-CoV-2 viral copies this assay can detect is 138 copies/mL. A negative result does not preclude SARS-Cov-2 infection and should not be used as the sole basis for treatment or other patient management decisions. A negative result may occur with  improper specimen collection/handling, submission of specimen other than nasopharyngeal swab, presence of viral mutation(s) within the areas targeted by this assay, and inadequate number of viral copies(<138 copies/mL). A negative result must be combined with clinical observations, patient history, and epidemiological information. The expected result is Negative.  Fact Sheet for Patients:  EntrepreneurPulse.com.au  Fact Sheet for Healthcare Providers:  IncredibleEmployment.be  This test is no t yet  approved or cleared by the [place] FDA and  has been authorized for detection and/or diagnosis of SARS-CoV-2 by FDA under an Emergency Use Authorization (EUA). This EUA will remain  in effect (meaning this test can be used) for the duration of the COVID-19 declaration under Section 564(b)(1) of the Act, 21 U.S.C.section 360bbb-3(b)(1), unless the authorization is terminated  or revoked sooner.       Influenza A by PCR NEGATIVE NEGATIVE   Influenza B by PCR NEGATIVE NEGATIVE    Comment: (NOTE) The Xpert Xpress SARS-CoV-2/FLU/RSV plus assay is intended as an aid in the diagnosis of influenza from  Nasopharyngeal swab specimens and should not be used as a sole basis for treatment. Nasal washings and aspirates are unacceptable for Xpert Xpress SARS-CoV-2/FLU/RSV testing.  Fact Sheet for Patients: EntrepreneurPulse.com.au  Fact Sheet for Healthcare Providers: IncredibleEmployment.be  This test is not yet approved or cleared by the [place] FDA and has been authorized for detection and/or diagnosis of SARS-CoV-2 by FDA under an Emergency Use Authorization (EUA). This EUA will remain in effect (meaning this test can be used) for the duration of the COVID-19 declaration under Section 564(b)(1) of the Act, 21 U.S.C. section 360bbb-3(b)(1), unless the authorization is terminated or revoked.  Performed at [facility], [place] [address], [place], [place] [address]   Comprehensive metabolic panel     Status: Abnormal   Collection Time: 10/02/22  4:20 AM  Result Value Ref Range   Sodium 143 135 - 145 mmol/L   Potassium 5.1 3.5 - 5.1 mmol/L   Chloride 107 98 - 111 mmol/L   CO2 23 22 - 32 mmol/L   Glucose, Bld 120 (H) 70 - 99 mg/dL    Comment: Glucose reference range applies only to samples taken after fasting for at least 8 hours.   BUN 47 (H) 6 - 20 mg/dL   Creatinine, Ser 11.00 (H) 0.61 - 1.24 mg/dL   Calcium 9.0 8.9 - 10.3 mg/dL    Total Protein 7.7 6.5 - 8.1 g/dL   Albumin 3.4 (L) 3.5 - 5.0 g/dL   AST 9 (L) 15 - 41 U/L   ALT 9 0 - 44 U/L   Alkaline Phosphatase 71 38 - 126 U/L   Total Bilirubin 1.5 (H) 0.3 - 1.2 mg/dL   GFR, Estimated 5 (L) >60 mL/min    Comment: (NOTE) Calculated using the CKD-EPI Creatinine Equation (2021)    Anion gap 13 5 - 15    Comment: Performed at [facility] [address], [place], [place] [address]  CBC with Differential     Status: Abnormal   Collection Time: 10/02/22  4:20 AM  Result Value Ref Range   WBC 5.9 4.0 - 10.5 K/uL   RBC 3.34 (L) 4.22 - 5.81 MIL/uL   Hemoglobin 9.7 (L) 13.0 - 17.0 g/dL   HCT 29.7 (L) 39.0 - 52.0 %   MCV 88.9 80.0 - 100.0 fL   MCH 29.0 26.0 - 34.0 pg   MCHC 32.7 30.0 - 36.0 g/dL   RDW 14.1 11.5 - 15.5 %   Platelets 215 150 - 400 K/uL   nRBC 0.0 0.0 - 0.2 %   Neutrophils Relative % 69 %   Neutro Abs 4.1 1.7 - 7.7 K/uL   Lymphocytes Relative 17 %   Lymphs Abs 1.0 0.7 - 4.0 K/uL   Monocytes Relative 10 %   Monocytes Absolute 0.6 0.1 - 1.0 K/uL   Eosinophils Relative 3 %   Eosinophils Absolute 0.2 0.0 - 0.5 K/uL   Basophils Relative 1 %   Basophils Absolute 0.0 0.0 - 0.1 K/uL   Immature Granulocytes 0 %   Abs Immature Granulocytes 0.01 0.00 - 0.07 K/uL    Comment: Performed at [facility] [address] [place], [place] [address]  Troponin I (High Sensitivity)     Status: Abnormal   Collection Time: 10/02/22  4:20 AM  Result Value Ref Range   Troponin I (High Sensitivity) 27 (H) <18 ng/L    Comment: (NOTE) Elevated high sensitivity troponin I (hsTnI) values and significant  changes across serial  measurements may suggest ACS but many other  chronic and acute conditions are known to elevate hsTnI results.  Refer to the "Links" section for chest pain algorithms and additional  guidance. Performed at [facility], [place] [address], [place], [place] [address]   Lipase, blood     Status: None   Collection Time: 10/02/22  4:20  AM  Result Value Ref Range   Lipase 28 11 - 51 U/L    Comment: Performed at [facility] [address], [place], [place] [address]  Brain natriuretic peptide     Status: Abnormal   Collection Time: 10/02/22  4:21 AM  Result Value Ref Range   B Natriuretic Peptide 2,179.1 (H) 0.0 - 100.0 pg/mL    Comment: Performed at [facility] [address], [place], [place] [address]  Troponin I (High Sensitivity)     Status: Abnormal   Collection Time: 10/02/22  7:25 AM  Result Value Ref Range   Troponin I (High Sensitivity) 29 (H) <18 ng/L    Comment: (NOTE) Elevated high sensitivity troponin I (hsTnI) values and significant  changes across serial measurements may suggest ACS but many other  chronic and acute conditions are known to elevate hsTnI results.  Refer to the "Links" section for chest pain algorithms and additional  guidance. Performed at [facility], [place] [address], [place], [place] [address]     DG Chest 2 View  Result Date: 10/02/2022 CLINICAL DATA:  61 year old male with history of chest pain and dyspnea. EXAM: CHEST - 2 VIEW COMPARISON:  Chest x-ray 04/20/2022. FINDINGS: Lung volumes are low. Bibasilar opacities may reflect areas of atelectasis and/or consolidation, with superimposed small bilateral pleural effusions (left-greater-than-right). No pneumothorax. No evidence of pulmonary edema. Heart size is normal. Upper mediastinal contours are within normal limits. IMPRESSION: 1. Low lung volumes with bibasilar areas of atelectasis and/or consolidation and superimposed small bilateral pleural effusions (left-greater-than-right). Electronically Signed   By: Vinnie Langton M.D.   On: 10/02/2022 05:41    Assessment/Plan **AHRF: requiring O2 to maintain sats - CXR with small effusions, exam suggests mild pum edema.  HFpEF +CKD 5 main issues.  Diurese and follow - may need to see if effusions are amenable to tap if persistently hypoxic.  Agree with lasix 120 IV  BID (was on lasix 80 po BID). 2g sodium, fluid restricted diet.  Weight daily, I/os.   **CKD 5: Advanced CKD at baseline 04/2022 GFR 10, now 5 - underlying issues HTN, h/o BL ablations.  Followed at [facility] but last OV was 06/2022 - hasn't attended recommended f/u.  No emergency indications for dialysis but I recommended to do so in light of progressive decline in GFR over past 6 months from 11 to 5, volume issues and low grade uremic symptoms (dysgeusia, nausea).   He is not agreeable to starting dialysis at this time though.  Will attempt to manage volume with diuretics.  Cont to follow labs, symptoms and educate him re: risk of waiting to start dialysis.  Follow daily labs, I/Os, use renal diet.    **HTN:  home meds have been resume, follow with diuresis  **h/o CVA 2014  **Anemia:  f/b ESA clinic outpt - hb acceptable.    Will follow, call with concerns.   Justin Mend 10/02/2022, 2:08 PM

## 2022-10-02 NOTE — ED Provider Triage Note (Signed)
Emergency Medicine Provider Triage Evaluation Note  RUEL DIMMICK , a 61 y.o. male  was evaluated in triage.  Pt complains of shortness of breath that has progressively worsened x 1 week. Dyspneic at rest, worse when supine, having to sit upright. Associated productive cough, chest tightness, and leg swelling. Has been vomiting and not keeping meds down- vomiting has been going on awhile. Hx of CKD, not currently on dialysis- patient still considering. Denies fever, chills, hemoptysis, or hematemesis. .  Review of Systems  Per above.  Physical Exam  BP (!) 196/106   Pulse 90   Temp 98.2 F (36.8 C) (Oral)   Resp 18   SpO2 98%  Gen:   Awake, no distress , Spo2 88-91% on RA---> placed on 2L via [place] w/ improvement  Resp:  Poor air movement, decreased breath sounds @ the bases.  MSK:   Moves extremities without difficulty. Edema in bilateral lower extremities.   Medical Decision Making  Medically screening exam initiated at 4:16 AM.  Appropriate orders placed.  SHAYDEN BOBIER was informed that the remainder of the evaluation will be completed by another provider, this initial triage assessment does not replace that evaluation, and the importance of remaining in the ED until their evaluation is complete.  Dyspnea.  No beds available in acute care of the ED @ this time, charge RN aware of acuity.     Amaryllis Dyke, [place] 10/02/22 [phone]

## 2022-10-02 NOTE — ED Provider Notes (Signed)
[facility] EMERGENCY DEPARTMENT Provider Note   CSN: [id number] Arrival date & time: 10/02/22  0345     History  Chief Complaint  Patient presents with   Shortness of Breath    Brandon [name] is a 61 y.o. male with history of hypertension associated diabetes, hyperlipidemia, seizure disorder on keppra, hx of renal cell cancer with subjective "ablation" of both kidneys, CKD, CHF, anemia with biweekly iron infusions, previous history of stroke who presents to the ED for evaluation of progressively worsening shortness of breath for the last week. Patient endorses SOB even at rest, worsened with exertion and when laying supine.  He has to sit upright at night. Also has productive cough, swelling of his legs causing cracking of the skin. Also endorses intermittent chest pain, nausea and vomiting. He is accompanied by a family member who provides much of the history and she reports patient was started on furosemide by her family med doctor who had referred him to a nephrologist but it does not appear that he has established care with them yet.  Per patient and family member, patient has been very reluctant to start dialysis as he does not want to have a fistula placed.  There does seem to be some degree of intellectual disability although he is quite pleasant.  He states that he still makes urine 2-3 times a day although it is very small and only dribbles out.    Patient and family member both seem unaware of patient's diagnosis of CHF though he has documented admission at [facility] in May 2023 for CHF exacerbation. Last echo in [place] with EF 59% in 04/2021. Denies stomach pain, diarrhea   Shortness of Breath      Home Medications Prior to Admission medications   Medication Sig Start Date End Date Taking? Authorizing Provider  amLODipine (NORVASC) 10 MG tablet Take 2 tablets (20 mg total) by mouth daily. Patient taking differently: Take 10 mg by mouth daily.  08/01/22  Yes Boscia, Greer Ee, NP  atorvastatin (LIPITOR) 40 MG tablet Take 1 tablet (40 mg total) by mouth daily. 08/01/22  Yes Boscia, Greer Ee, NP  carvedilol (COREG) 25 MG tablet Take 25 mg by mouth 2 (two) times daily. 09/11/22  Yes [provider]  ezetimibe (ZETIA) 10 MG tablet Take 1 tablet (10 mg total) by mouth daily. 10/19/21 10/03/23 Yes Tysinger, Camelia Eng, PA-C  furosemide (LASIX) 80 MG tablet Take 80 mg by mouth 2 (two) times daily. 08/21/22  Yes [provider]  HYDROcodone-acetaminophen (NORCO) 5-325 MG tablet Take 1 tablet by mouth every 6 (six) hours as needed for moderate pain. 03/18/22  Yes Horton, Alvin Critchley, DO  levETIRAcetam (KEPPRA) 500 MG tablet Take 2 tablets (1,000 mg total) by mouth daily. Patient taking differently: Take 500 mg by mouth 2 (two) times daily. 10/19/21 10/03/23 Yes Tysinger, Camelia Eng, PA-C  sertraline (ZOLOFT) 100 MG tablet Take 1 tablet (100 mg total) by mouth daily. 05/30/22  Yes Boscia, Heather E, NP  sodium bicarbonate 650 MG tablet Take 1,300 mg by mouth 2 (two) times daily. 07/08/22  Yes [provider]  blood glucose meter kit and supplies Dispense based on patient and insurance preference. Use up to four times daily as directed. (FOR ICD-9 250.00, 250.01). Patient taking differently: 1 each by Other route See admin instructions. Dispense based on patient and insurance preference. Use up to four times daily as directed. (FOR ICD-9 250.00, 250.01). 04/24/16   Eugenie Filler, MD  glucose blood (ONETOUCH ULTRA) test strip Blood sugar testing done TID for insulin dependant Type @ diabetes - E11.65 04/12/22   Ronnell Freshwater, NP  Insulin Pen Needle (NOVOFINE PLUS PEN NEEDLE) 32G X 4 MM MISC Use as directed with insulin pen 07/10/21   [place] Donnamarie Poag, DO  Lancets (ACCU-CHEK SOFT TOUCH) lancets Use as instructed Patient taking differently: 1 each by Other route as directed. 06/11/19   Tysinger, Camelia Eng, PA-C  potassium chloride (KLOR-CON)  10 MEQ tablet Take 10 mEq by mouth daily. 07/29/22   [provider]      Allergies    Hydralazine, Imdur [isosorbide nitrate], and Morphine    Review of Systems   Review of Systems  Respiratory:  Positive for shortness of breath.     Physical Exam Updated Vital Signs BP (!) 189/138   Pulse 94   Temp 98.3 F (36.8 C) (Oral)   Resp (!) 31   SpO2 96%  Physical Exam Vitals and nursing note reviewed.  Constitutional:      General: He is not in acute distress.    Appearance: He is not ill-appearing.  HENT:     Head: Atraumatic.  Eyes:     Conjunctiva/sclera: Conjunctivae normal.  Cardiovascular:     Rate and Rhythm: Normal rate and regular rhythm.     Pulses:          Radial pulses are 2+ on the right side and 2+ on the left side.       Dorsalis pedis pulses are 1+ on the right side and 1+ on the left side.     Heart sounds: No murmur heard. Pulmonary:     Effort: Pulmonary effort is normal. Tachypnea present. No respiratory distress.     Breath sounds: Normal breath sounds.  Abdominal:     General: Abdomen is flat. There is no distension.     Palpations: Abdomen is soft.     Tenderness: There is no abdominal tenderness.  Musculoskeletal:        General: Normal range of motion.     Cervical back: Normal range of motion.     Right lower leg: 3+ Pitting Edema present.     Left lower leg: 3+ Pitting Edema present.  Skin:    General: Skin is warm and dry.     Capillary Refill: Capillary refill takes less than 2 seconds.     Comments: Skin breakdown noted to the bilateral anterior lower extremities without distinct cellulitic changes  Neurological:     General: No focal deficit present.     Mental Status: He is alert.  Psychiatric:        Mood and Affect: Mood normal.     ED Results / Procedures / Treatments   Labs (all labs ordered are listed, but only abnormal results are displayed) Labs Reviewed  COMPREHENSIVE METABOLIC PANEL - Abnormal; Notable for the  following components:      Result Value   Glucose, Bld 120 (*)    BUN 47 (*)    Creatinine, Ser 11.00 (*)    Albumin 3.4 (*)    AST 9 (*)    Total Bilirubin 1.5 (*)    GFR, Estimated 5 (*)    All other components within normal limits  CBC WITH DIFFERENTIAL/PLATELET - Abnormal; Notable for the following components:   RBC 3.34 (*)    Hemoglobin 9.7 (*)    HCT 29.7 (*)    All other components within normal limits  BRAIN  NATRIURETIC PEPTIDE - Abnormal; Notable for the following components:   B Natriuretic Peptide 2,179.1 (*)    All other components within normal limits  TROPONIN I (HIGH SENSITIVITY) - Abnormal; Notable for the following components:   Troponin I (High Sensitivity) 27 (*)    All other components within normal limits  TROPONIN I (HIGH SENSITIVITY) - Abnormal; Notable for the following components:   Troponin I (High Sensitivity) 29 (*)    All other components within normal limits  RESP PANEL BY RT-PCR (FLU A&B, COVID) ARPGX2  LIPASE, BLOOD  HIV ANTIBODY (ROUTINE TESTING W REFLEX)  TSH    EKG None  Radiology DG Chest 2 View  Result Date: 10/02/2022 CLINICAL DATA:  61 year old male with history of chest pain and dyspnea. EXAM: CHEST - 2 VIEW COMPARISON:  Chest x-ray 04/20/2022. FINDINGS: Lung volumes are low. Bibasilar opacities may reflect areas of atelectasis and/or consolidation, with superimposed small bilateral pleural effusions (left-greater-than-right). No pneumothorax. No evidence of pulmonary edema. Heart size is normal. Upper mediastinal contours are within normal limits. IMPRESSION: 1. Low lung volumes with bibasilar areas of atelectasis and/or consolidation and superimposed small bilateral pleural effusions (left-greater-than-right). Electronically Signed   By: Vinnie Langton M.D.   On: 10/02/2022 05:41    Procedures Procedures    Medications Ordered in ED Medications  amLODipine (NORVASC) tablet 10 mg (10 mg Oral Given 10/02/22 0914)  carvedilol  (COREG) tablet 25 mg (25 mg Oral Given 10/02/22 0914)  heparin injection 5,000 Units (5,000 Units Subcutaneous Given 10/02/22 0916)  sodium chloride flush (NS) 0.9 % injection 3 mL (has no administration in time range)  furosemide (LASIX) 120 mg in dextrose 5 % 50 mL IVPB (has no administration in time range)  acetaminophen (TYLENOL) tablet 650 mg (has no administration in time range)    Or  acetaminophen (TYLENOL) suppository 650 mg (has no administration in time range)  albuterol (PROVENTIL) (2.5 MG/3ML) 0.083% nebulizer solution 2.5 mg (2.5 mg Nebulization Given 10/02/22 0855)  labetalol (NORMODYNE) injection 10 mg (has no administration in time range)  albuterol (PROVENTIL) (2.5 MG/3ML) 0.083% nebulizer solution (  Not Given 10/02/22 0858)  furosemide (LASIX) injection 80 mg (80 mg Intravenous Given 10/02/22 1610)    ED Course/ Medical Decision Making/ A&P                           Medical Decision Making Risk Prescription drug management. Decision regarding hospitalization.   Social determinants of health:  Social History   Socioeconomic History   Marital status: Single    Spouse name: Not on file   Number of children: 2   Years of education: college   Highest education level: Not on file  Occupational History   Occupation: disabled  Tobacco Use   Smoking status: Never   Smokeless tobacco: Never  Vaping Use   Vaping Use: Never used  Substance and Sexual Activity   Alcohol use: Not Currently    Comment: rarely   Drug use: No   Sexual activity: Never  Other Topics Concern   Not on file  Social History Narrative   Patient lives alone.   Education. Two years of college.   Not working.   Right handed.   Caffeine one soda daily. Drinks coffee    Social Determinants of Health   Financial Resource Strain: Low Risk  (08/01/2022)   Overall Financial Resource Strain (CARDIA)    Difficulty of Paying Living Expenses: Not very hard  Food Insecurity: No Food Insecurity  (08/01/2022)   Hunger Vital Sign    Worried About Running Out of Food in the Last Year: Never true    Ran Out of Food in the Last Year: Never true  Transportation Needs: No Transportation Needs (08/01/2022)   PRAPARE - Hydrologist (Medical): No    Lack of Transportation (Non-Medical): No  Physical Activity: Insufficiently Active (08/01/2022)   Exercise Vital Sign    Days of Exercise per Week: 1 day    Minutes of Exercise per Session: 10 min  Stress: No Stress Concern Present (08/01/2022)   [place]    Feeling of Stress : Only a little  Social Connections: Socially Isolated (08/01/2022)   Social Connection and Isolation Panel [NHANES]    Frequency of Communication with Friends and Family: Once a week    Frequency of Social Gatherings with Friends and Family: Once a week    Attends Religious Services: 1 to 4 times per year    Active Member of [employer] or Organizations: No    Attends Archivist Meetings: Never    Marital Status: Never married  Intimate Partner Violence: Not At Risk (08/01/2022)   Humiliation, Afraid, Rape, and Kick questionnaire    Fear of Current or Ex-Partner: No    Emotionally Abused: No    Physically Abused: No    Sexually Abused: No     Initial impression:  This patient presents to the ED for concern of progressively worsening shortness of breath, leg swelling and some chest tightness, this involves an extensive number of treatment options, and is a complaint that carries with it a high risk of complications and morbidity.   Differentials include ACS, CHF exacerbation, PE, DVT, worsening kidney dysfunction, COVID/flu  Comorbidities affecting care:  Numerous, refer to HPI  Additional history obtained: Extensive chart review, family member at bedside  Lab Tests  I Ordered, reviewed, and interpreted labs and EKG.  The pertinent results include:  Delta troponin  elevated to 29 though stable Hemoglobin 9.7, stable from previous Creatinine 11, significantly increased from 6 5 months ago Newly reduced GFR 5, down from 14 Negative COVID and flu BNP elevated greater than 2000  Imaging Studies ordered:  I ordered imaging studies including  Chest x-ray with bibasilar atelectasis, possible consolidation with small bilateral pleural effusions left worse than right  I independently visualized and interpreted imaging and I agree with the radiologist interpretation.   EKG: Sinus rhythm with first-degree AV block   Medicines ordered and prescription drug management:  I ordered medication including: Lasix 80 mg IV Reevaluation of the patient after these medicines showed that the patient stayed the same I have reviewed the patients home medicines and have made adjustments as needed   Consultations Obtained:  I requested consultation with nephrology and spoke with Dr Johnney Ou,  and discussed lab and imaging findings as well as pertinent plan - they recommend: administer IV Lasix and they will likely consult on patient during admission for initiation of dialysis   ED Course/Re-evaluation: Patient is overall pleasant and in no obvious distress.  Patient is very pleasant although has some intellectual delay.  Reported initial SPO2 of 88% in triage with 2 L O2 applied.  Patient is speaking in full and complete sentences although does seem to have mildly increased respiratory effort.  3+ bilateral lower extremity pitting edema with associated skin changes.  No evidence of cellulitis.  Abdomen  is soft, nontender.  BNP ordered in triage elevated at greater than 2000.  I discussed with family that he may be having an exacerbation of his CHF, and they both seemed very surprised by this diagnosis and state that they have never been told that he has congestive heart failure despite [facility] records for CHF admission in May 2023 Additionally, patient's creatinine is 11 with  newly reduced GFR down to 5.  They are aware of his ongoing kidney problems and he has been putting off starting dialysis for quite some time.  We discussed that he he is now at the point where dialysis is no longer an option and today both he and family member are understanding.  After consulting with nephrology, I started him on 80 mg IV Lasix.  Plan to admit for worsening kidney function and CHF exacerbation.  Disposition:  After consideration of the diagnostic results, physical exam, history and the patients response to treatment feel that the patent would benefit from admissin.   Shortness of breath CHF exacerbation Acute on chronic renal failure: Plan and management as described above. Discharged home in good condition.  Final Clinical Impression(s) / ED Diagnoses Final diagnoses:  Shortness of breath  Acute on chronic congestive heart failure, unspecified heart failure type ([place])  Acute renal failure superimposed on stage 5 chronic kidney disease, not on chronic dialysis, unspecified acute renal failure type [facility])    Rx / DC Orders ED Discharge Orders     None         Tonye Pearson, PA-C 10/02/22 [place], Adeline, MD 10/02/22 [phone]

## 2022-10-02 NOTE — ED Notes (Signed)
Pt called out to nurse's station stating that he feels more SOB. Pt now having slight exp wheezing. Admitting doctor notified. Awaiting new orders.

## 2022-10-02 NOTE — ED Triage Notes (Signed)
Pt reported to ED with shortness of breath has been increasing for one week. Pt initial O2 saturation during triage 88% and 2L O2 applied to pt. Provider aware of the same.

## 2022-10-03 ENCOUNTER — Inpatient Hospital Stay (HOSPITAL_COMMUNITY): Payer: Medicare Other

## 2022-10-03 DIAGNOSIS — I5033 Acute on chronic diastolic (congestive) heart failure: Secondary | ICD-10-CM

## 2022-10-03 DIAGNOSIS — J9601 Acute respiratory failure with hypoxia: Secondary | ICD-10-CM | POA: Diagnosis not present

## 2022-10-03 LAB — RENAL FUNCTION PANEL
Albumin: 2.8 g/dL — ABNORMAL LOW (ref 3.5–5.0)
Anion gap: 10 (ref 5–15)
BUN: 50 mg/dL — ABNORMAL HIGH (ref 6–20)
CO2: 22 mmol/L (ref 22–32)
Calcium: 8.4 mg/dL — ABNORMAL LOW (ref 8.9–10.3)
Chloride: 109 mmol/L (ref 98–111)
Creatinine, Ser: 11.03 mg/dL — ABNORMAL HIGH (ref 0.61–1.24)
GFR, Estimated: 5 mL/min — ABNORMAL LOW (ref >60)
Glucose, Bld: 120 mg/dL — ABNORMAL HIGH (ref 70–99)
Phosphorus: 5.4 mg/dL — ABNORMAL HIGH (ref 2.5–4.6)
Potassium: 4.7 mmol/L (ref 3.5–5.1)
Sodium: 141 mmol/L (ref 135–145)

## 2022-10-03 LAB — ECHOCARDIOGRAM COMPLETE
Area-P 1/2: 2.8 cm2
Calc EF: 55.8 %
S' Lateral: 2.95 cm
Single Plane A2C EF: 51.6 %
Single Plane A4C EF: 61.9 %

## 2022-10-03 LAB — HIV ANTIBODY (ROUTINE TESTING W REFLEX): HIV Screen 4th Generation wRfx: NONREACTIVE

## 2022-10-03 LAB — TSH: TSH: 3.044 u[IU]/mL (ref 0.350–4.500)

## 2022-10-03 NOTE — Progress Notes (Signed)
PT Cancellation Note  Patient Details Name: Brandon [name] MRN: [id number] DOB: 1961-03-05   Cancelled Treatment:    Reason Eval/Treat Not Completed: Patient declined, no reason specified.    Shary Decamp [facility] 10/03/2022, 3:09 PM [place] Office [phone]

## 2022-10-03 NOTE — Progress Notes (Signed)
  Echocardiogram 2D Echocardiogram has been performed.  Brandon [name] 10/03/2022, 11:17 AM

## 2022-10-03 NOTE — Progress Notes (Addendum)
Heart Failure Navigator Progress Note  Assessed for Heart & Vascular TOC clinic readiness.  Patient does not meet criteria due to ESRD, patient not agreeable to starting dialysis .  [name]   Earnestine Leys, BSN, Clinical cytogeneticist Only

## 2022-10-03 NOTE — Progress Notes (Signed)
Receive pt to the floor around 6am,His blood pressure has been running high,ED RN gave a dose of iv labetalol before transferring to 5W.Pt BP was 187/103 and manually 190/110.Secure chat with Dr.Rathore,ordered to give 10am scheduled tab.amlodipin.Pill given,will continue to monitor.

## 2022-10-03 NOTE — Progress Notes (Signed)
A consult was placed to IV Therapy to change the pt's iv site from the R [facility] area;  pt and wife both stated "he's a hard stick."   Tight [place] removed from RAC area as it had left a deep mark on the pt's arm.  NSL flushed twice w NS; pt stated "feels fine; just leave it";  pt is only receiving IV Lasix BID and prefers not to be stuck at this time.  Pt also stated he will "try not to bend my right arm.:"

## 2022-10-03 NOTE — ED Notes (Signed)
The pt was transferred here from the hallway  sleeping anf his pulse came off  sats ok with  application of a new pulse ox  blankets c/o bwinf cold

## 2022-10-03 NOTE — Progress Notes (Signed)
[facility] Progress Note   Assessment/ Plan:   **AHRF: requiring O2 to maintain sats - CXR with small effusions, exam suggests mild pum edema.  HFpEF +CKD 5 main issues.  Diurese and follow - may need to see if effusions are amenable to tap if persistently hypoxic.  - continue lasix 120 IV BID (was on lasix 80 po BID). 2g sodium, fluid restricted diet.  Weight daily, I/os.    **CKD 5: Advanced CKD at baseline 04/2022 GFR 10, now 5 - underlying issues HTN, h/o BL ablations.  Followed at [facility] but last OV was 06/2022 - hasn't attended recommended f/u.  No emergency indications for dialysis but I recommended to do so in light of progressive decline in GFR over past 6 months from 11 to 5, volume issues and low grade uremic symptoms (dysgeusia, nausea).    - is declining dialysis at this time despite conversations with me and Dr Johnney Ou - do not expect any of his mild uremic symptoms or his volume to get much better until it's more definitively managed - palliative care c/s would be appropriate- have ordered     **HTN:  home meds have been resume, follow with diuresis   **h/o CVA 2014   **Anemia:  f/b ESA clinic outpt - hb acceptable.     Will follow, call with concerns.   Subjective:    Seen in room.  Adamant about not starting dialysis at this time.  Some nausea.   Objective:   BP (!) 144/81 (BP Location: Right Arm)   Pulse (!) 55   Temp 98.3 F (36.8 C) (Oral)   Resp 16   SpO2 96%   Intake/Output Summary (Last 24 hours) at 10/03/2022 1403 Last data filed at 10/03/2022 1108 Gross per 24 hour  Intake 100 ml  Output 250 ml  Net -150 ml   Weight change:   Physical Exam: Gen:NAD, sitting in bed CVS:RRR Resp: bibasilar crackles Abd: soft Ext:3+ pitting edema bilaterally  Imaging: ECHOCARDIOGRAM COMPLETE  Result Date: 10/03/2022    ECHOCARDIOGRAM REPORT   Patient Name:   Brandon [name] Date of Exam: 10/03/2022 Medical Rec #:  [id number]         Height:        72.0 in Accession #:    [id number]        Weight:       262.1 lb Date of Birth:  Sep 10, 1961          BSA:          2.390 m Patient Age:    61 years          BP:           190/110 mmHg Patient Gender: M                 HR:           57 bpm. Exam Location:  Inpatient Procedure: 2D Echo, Cardiac Doppler and Color Doppler Indications:    I50.40* Unspecified combined systolic (congestive) and diastolic                 (congestive) heart failure  History:        Patient has prior history of Echocardiogram examinations, most                 recent 11/19/2013. Stroke, Signs/Symptoms:Altered Mental Status;                 Risk Factors:Diabetes.  Sonographer:  Roseanna Rainbow RDCS Referring Phys: [id number] RONDELL A SMITH  Sonographer Comments: Technically difficult study due to poor echo windows. Image acquisition challenging due to patient body habitus. Attempted to turn. IMPRESSIONS  1. Left ventricular ejection fraction, by estimation, is 50 to 55%. The left ventricle has low normal function. Left ventricular endocardial border not optimally defined to evaluate regional wall motion. There is moderate left ventricular hypertrophy. Left ventricular diastolic parameters were low normal for age.  2. Right ventricular systolic function is normal. The right ventricular size is normal. There is normal pulmonary artery systolic pressure. The estimated right ventricular systolic pressure is 31.4 mmHg.  3. The mitral valve is normal in structure. Trivial mitral valve regurgitation. No evidence of mitral stenosis.  4. The aortic valve is grossly normal. Aortic valve regurgitation is not visualized. No aortic stenosis is present.  5. The inferior vena cava is dilated in size with >50% respiratory variability, suggesting right atrial pressure of 8 mmHg. FINDINGS  Left Ventricle: Left ventricular ejection fraction, by estimation, is 50 to 55%. The left ventricle has low normal function. Left ventricular endocardial border not optimally  defined to evaluate regional wall motion. The left ventricular internal cavity  size was normal in size. There is moderate left ventricular hypertrophy. Left ventricular diastolic parameters were normal. Right Ventricle: The right ventricular size is normal. No increase in right ventricular wall thickness. Right ventricular systolic function is normal. There is normal pulmonary artery systolic pressure. The tricuspid regurgitant velocity is 2.61 m/s, and  with an assumed right atrial pressure of 8 mmHg, the estimated right ventricular systolic pressure is 97.0 mmHg. Left Atrium: Left atrial size was normal in size. Right Atrium: Right atrial size was normal in size. Pericardium: There is no evidence of pericardial effusion. Mitral Valve: The mitral valve is normal in structure. Trivial mitral valve regurgitation. No evidence of mitral valve stenosis. Tricuspid Valve: The tricuspid valve is normal in structure. Tricuspid valve regurgitation is mild . No evidence of tricuspid stenosis. Aortic Valve: The aortic valve is grossly normal. Aortic valve regurgitation is not visualized. No aortic stenosis is present. Pulmonic Valve: The pulmonic valve was normal in structure. Pulmonic valve regurgitation is not visualized. No evidence of pulmonic stenosis. Aorta: The aortic root is normal in size and structure. Ascending aorta measurements are within normal limits for age when indexed to body surface area. Venous: The inferior vena cava is dilated in size with greater than 50% respiratory variability, suggesting right atrial pressure of 8 mmHg. IAS/Shunts: No atrial level shunt detected by color flow Doppler. Additional Comments: There is pleural effusion in the left lateral region.  LEFT VENTRICLE PLAX 2D LVIDd:         4.50 cm      Diastology LVIDs:         2.95 cm      LV e' medial:    5.52 cm/s LV PW:         1.50 cm      LV E/e' medial:  13.1 LV IVS:        1.55 cm      LV e' lateral:   8.84 cm/s LVOT diam:     2.10 cm       LV E/e' lateral: 8.2 LV SV:         67 LV SV Index:   28 LVOT Area:     3.46 cm  LV Volumes (MOD) LV vol d, MOD A2C: 191.0 ml LV vol d, MOD A4C:  109.0 ml LV vol s, MOD A2C: 92.5 ml LV vol s, MOD A4C: 41.5 ml LV SV MOD A2C:     98.5 ml LV SV MOD A4C:     109.0 ml LV SV MOD BP:      81.8 ml RIGHT VENTRICLE            IVC RV S prime:     9.00 cm/s  IVC diam: 2.30 cm TAPSE (M-mode): 1.5 cm LEFT ATRIUM             Index        RIGHT ATRIUM           Index LA diam:        4.70 cm 1.97 cm/m   RA Area:     21.30 cm LA Vol (A2C):   81.1 ml 33.93 ml/m  RA Volume:   67.30 ml  28.16 ml/m LA Vol (A4C):   56.0 ml 23.43 ml/m LA Biplane Vol: 69.3 ml 28.99 ml/m  AORTIC VALVE LVOT Vmax:   83.60 cm/s LVOT Vmean:  52.900 cm/s LVOT VTI:    0.192 m  AORTA Ao Root diam: 3.70 cm Ao Asc diam:  3.50 cm MITRAL VALVE               TRICUSPID VALVE MV Area (PHT): 2.80 cm    TR Peak grad:   27.2 mmHg MV Decel Time: 271 msec    TR Vmax:        261.00 cm/s MV E velocity: 72.40 cm/s MV A velocity: 71.10 cm/s  SHUNTS MV E/A ratio:  1.02        Systemic VTI:  0.19 m                            Systemic Diam: 2.10 cm Cherlynn Kaiser MD Electronically signed by Cherlynn Kaiser MD Signature Date/Time: 10/03/2022/12:30:47 PM    Final    DG Chest 2 View  Result Date: 10/02/2022 CLINICAL DATA:  61 year old male with history of chest pain and dyspnea. EXAM: CHEST - 2 VIEW COMPARISON:  Chest x-ray 04/20/2022. FINDINGS: Lung volumes are low. Bibasilar opacities may reflect areas of atelectasis and/or consolidation, with superimposed small bilateral pleural effusions (left-greater-than-right). No pneumothorax. No evidence of pulmonary edema. Heart size is normal. Upper mediastinal contours are within normal limits. IMPRESSION: 1. Low lung volumes with bibasilar areas of atelectasis and/or consolidation and superimposed small bilateral pleural effusions (left-greater-than-right). Electronically Signed   By: Vinnie Langton M.D.   On: 10/02/2022  05:41    Labs: BMET Recent Labs  Lab 10/02/22 0420 10/03/22 0601  NA 143 141  K 5.1 4.7  CL 107 109  CO2 23 22  GLUCOSE 120* 120*  BUN 47* 50*  CREATININE 11.00* 11.03*  CALCIUM 9.0 8.4*  PHOS  --  5.4*   CBC Recent Labs  Lab 10/02/22 0420  WBC 5.9  NEUTROABS 4.1  HGB 9.7*  HCT 29.7*  MCV 88.9  PLT 215    Medications:     amLODipine  10 mg Oral Daily   atorvastatin  40 mg Oral Daily   carvedilol  25 mg Oral BID   ezetimibe  10 mg Oral Daily   heparin  5,000 Units Subcutaneous Q8H   levETIRAcetam  500 mg Oral BID   sertraline  100 mg Oral Daily   sodium bicarbonate  1,300 mg Oral BID   sodium chloride flush  3 mL Intravenous Q12H  Madelon Lips MD 10/03/2022, 2:03 PM

## 2022-10-03 NOTE — Progress Notes (Signed)
Mobility Specialist Progress Note:   10/03/22 1140  Mobility  Activity Ambulated with assistance to bathroom  Level of Assistance Minimal assist, patient does 75% or more  Assistive Device None  Distance Ambulated (ft) 15 ft  Activity Response Tolerated well  Mobility Referral Yes  $Mobility charge 1 Mobility   Pt requesting to go to BR for BM. Required MinA to stand from EOB, minG for gait. Pt left in BR, NT aware and in to give bath when pt ready. Wife present.   Nelta Numbers [facility] Secure Chat or Office Phone: [phone]
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
## 2022-10-03 NOTE — ED Notes (Signed)
02 sat was low  pulse ox had fallen off  new one placed 94 % nasal 02 2 liters

## 2022-10-04 DIAGNOSIS — R0602 Shortness of breath: Secondary | ICD-10-CM | POA: Diagnosis not present

## 2022-10-04 DIAGNOSIS — I5033 Acute on chronic diastolic (congestive) heart failure: Secondary | ICD-10-CM | POA: Diagnosis not present

## 2022-10-04 DIAGNOSIS — Z789 Other specified health status: Secondary | ICD-10-CM

## 2022-10-04 DIAGNOSIS — I509 Heart failure, unspecified: Secondary | ICD-10-CM

## 2022-10-04 DIAGNOSIS — J9601 Acute respiratory failure with hypoxia: Secondary | ICD-10-CM | POA: Diagnosis not present

## 2022-10-04 DIAGNOSIS — Z515 Encounter for palliative care: Secondary | ICD-10-CM

## 2022-10-04 LAB — RENAL FUNCTION PANEL
Albumin: 3 g/dL — ABNORMAL LOW (ref 3.5–5.0)
Anion gap: 10 (ref 5–15)
BUN: 50 mg/dL — ABNORMAL HIGH (ref 6–20)
CO2: 23 mmol/L (ref 22–32)
Calcium: 8.5 mg/dL — ABNORMAL LOW (ref 8.9–10.3)
Chloride: 110 mmol/L (ref 98–111)
Creatinine, Ser: 11.3 mg/dL — ABNORMAL HIGH (ref 0.61–1.24)
GFR, Estimated: 5 mL/min — ABNORMAL LOW (ref >60)
Glucose, Bld: 95 mg/dL (ref 70–99)
Phosphorus: 5.9 mg/dL — ABNORMAL HIGH (ref 2.5–4.6)
Potassium: 4.5 mmol/L (ref 3.5–5.1)
Sodium: 143 mmol/L (ref 135–145)

## 2022-10-04 MED ORDER — INFLUENZA VAC SPLIT QUAD 0.5 ML IM SUSY
0.5000 mL | PREFILLED_SYRINGE | INTRAMUSCULAR | Status: AC
  Administered 2022-10-06: 0.5 mL via INTRAMUSCULAR
  Filled 2022-10-04: qty 0.5
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
## 2022-10-04 NOTE — Progress Notes (Signed)
PT Cancellation Note  Patient Details Name: Brandon [name] MRN: [id number] DOB: [birth year]   Cancelled Treatment:    Reason Eval/Treat Not Completed: Patient declined, no reason specified. Pt again adamantly refusing. Will continue attempts but if continues to refuse will sign off. Per mobility note yesterday pt is capable of moving when he chooses to.    Shary Decamp [facility] 10/04/2022, 10:10 AM Suanne Marker PT Acute [employer] [phone]

## 2022-10-04 NOTE — Progress Notes (Signed)
  Transition of Care [facility]) Screening Note   Patient Details  Name: Brandon [name] Date of Birth: 1961-03-03   Transition of Care [facility]) CM/SW Contact:    Pollie Friar, RN Phone Number: 10/04/2022, 12:41 PM   From home with family. Palliative care to see about pt refusing HD. Following for potential oxygen needs.  Transition of Care [facility]) has reviewed patient. We will continue to monitor patient advancement through interdisciplinary progression rounds. If new patient transition needs arise, please place a TOC consult.

## 2022-10-04 NOTE — Progress Notes (Signed)
[facility] Progress Note   Assessment/ Plan:   **AHRF: requiring O2 to maintain sats - CXR with small effusions, exam suggests mild pum edema.  HFpEF +CKD 5 main issues.  Diurese and follow - may need to see if effusions are amenable to tap if persistently hypoxic.  - continue lasix 120 IV BID (was on lasix 80 po BID). 2g sodium, fluid restricted diet.  Weight daily, I/os.    **CKD 5: Advanced CKD at baseline 04/2022 GFR 10, now 5 - underlying issues HTN, h/o BL ablations.  Followed at [facility] but last OV was 06/2022 - hasn't attended recommended f/u.  No emergency indications for dialysis but I recommended to do so in light of progressive decline in GFR over past 6 months from 11 to 5, volume issues and low grade uremic symptoms (dysgeusia, nausea).    - is declining dialysis at this time despite conversations with me and Dr Johnney Ou - do not expect any of his mild uremic symptoms or his volume to get much better until it's more definitively managed - palliative care c/s would be appropriate- have ordered     **HTN:  home meds have been resume, follow with diuresis   **h/o CVA 2014   **Anemia:  f/b ESA clinic outpt - hb acceptable.     Will follow, call with concerns.   Subjective:    Seen in room.  Lying in bed with lights off, says he is OK.  No needs expressed.   Objective:   BP (!) 165/71   Pulse 63   Temp 98.7 F (37.1 C) (Oral)   Resp 18   Ht 6' (1.829 m)   Wt (!) 182.9 kg   SpO2 92%   BMI 54.69 kg/m   Intake/Output Summary (Last 24 hours) at 10/04/2022 1446 Last data filed at 10/03/2022 2210 Gross per 24 hour  Intake 63.66 ml  Output 200 ml  Net -136.34 ml   Weight change:   Physical Exam: Gen:NAD, sitting in bed CVS:RRR Resp: bibasilar crackles Abd: soft Ext: 3+ pitting edema bilaterally  Imaging: ECHOCARDIOGRAM COMPLETE  Result Date: 10/03/2022    ECHOCARDIOGRAM REPORT   Patient Name:   Brandon [name] Date of Exam: 10/03/2022 Medical Rec #:   [id number]         Height:       72.0 in Accession #:    [id number]        Weight:       262.1 lb Date of Birth:  1961/02/18          BSA:          2.390 m Patient Age:    61 years          BP:           190/110 mmHg Patient Gender: M                 HR:           57 bpm. Exam Location:  Inpatient Procedure: 2D Echo, Cardiac Doppler and Color Doppler Indications:    I50.40* Unspecified combined systolic (congestive) and diastolic                 (congestive) heart failure  History:        Patient has prior history of Echocardiogram examinations, most                 recent 11/19/2013. Stroke, Signs/Symptoms:Altered Mental Status;  Risk Factors:Diabetes.  Sonographer:    Roseanna Rainbow RDCS Referring Phys: [id number] RONDELL A SMITH  Sonographer Comments: Technically difficult study due to poor echo windows. Image acquisition challenging due to patient body habitus. Attempted to turn. IMPRESSIONS  1. Left ventricular ejection fraction, by estimation, is 50 to 55%. The left ventricle has low normal function. Left ventricular endocardial border not optimally defined to evaluate regional wall motion. There is moderate left ventricular hypertrophy. Left ventricular diastolic parameters were low normal for age.  2. Right ventricular systolic function is normal. The right ventricular size is normal. There is normal pulmonary artery systolic pressure. The estimated right ventricular systolic pressure is 98.3 mmHg.  3. The mitral valve is normal in structure. Trivial mitral valve regurgitation. No evidence of mitral stenosis.  4. The aortic valve is grossly normal. Aortic valve regurgitation is not visualized. No aortic stenosis is present.  5. The inferior vena cava is dilated in size with >50% respiratory variability, suggesting right atrial pressure of 8 mmHg. FINDINGS  Left Ventricle: Left ventricular ejection fraction, by estimation, is 50 to 55%. The left ventricle has low normal function. Left ventricular  endocardial border not optimally defined to evaluate regional wall motion. The left ventricular internal cavity  size was normal in size. There is moderate left ventricular hypertrophy. Left ventricular diastolic parameters were normal. Right Ventricle: The right ventricular size is normal. No increase in right ventricular wall thickness. Right ventricular systolic function is normal. There is normal pulmonary artery systolic pressure. The tricuspid regurgitant velocity is 2.61 m/s, and  with an assumed right atrial pressure of 8 mmHg, the estimated right ventricular systolic pressure is 38.2 mmHg. Left Atrium: Left atrial size was normal in size. Right Atrium: Right atrial size was normal in size. Pericardium: There is no evidence of pericardial effusion. Mitral Valve: The mitral valve is normal in structure. Trivial mitral valve regurgitation. No evidence of mitral valve stenosis. Tricuspid Valve: The tricuspid valve is normal in structure. Tricuspid valve regurgitation is mild . No evidence of tricuspid stenosis. Aortic Valve: The aortic valve is grossly normal. Aortic valve regurgitation is not visualized. No aortic stenosis is present. Pulmonic Valve: The pulmonic valve was normal in structure. Pulmonic valve regurgitation is not visualized. No evidence of pulmonic stenosis. Aorta: The aortic root is normal in size and structure. Ascending aorta measurements are within normal limits for age when indexed to body surface area. Venous: The inferior vena cava is dilated in size with greater than 50% respiratory variability, suggesting right atrial pressure of 8 mmHg. IAS/Shunts: No atrial level shunt detected by color flow Doppler. Additional Comments: There is pleural effusion in the left lateral region.  LEFT VENTRICLE PLAX 2D LVIDd:         4.50 cm      Diastology LVIDs:         2.95 cm      LV e' medial:    5.52 cm/s LV PW:         1.50 cm      LV E/e' medial:  13.1 LV IVS:        1.55 cm      LV e' lateral:    8.84 cm/s LVOT diam:     2.10 cm      LV E/e' lateral: 8.2 LV SV:         67 LV SV Index:   28 LVOT Area:     3.46 cm  LV Volumes (MOD) LV vol d, MOD A2C:  191.0 ml LV vol d, MOD A4C: 109.0 ml LV vol s, MOD A2C: 92.5 ml LV vol s, MOD A4C: 41.5 ml LV SV MOD A2C:     98.5 ml LV SV MOD A4C:     109.0 ml LV SV MOD BP:      81.8 ml RIGHT VENTRICLE            IVC RV S prime:     9.00 cm/s  IVC diam: 2.30 cm TAPSE (M-mode): 1.5 cm LEFT ATRIUM             Index        RIGHT ATRIUM           Index LA diam:        4.70 cm 1.97 cm/m   RA Area:     21.30 cm LA Vol (A2C):   81.1 ml 33.93 ml/m  RA Volume:   67.30 ml  28.16 ml/m LA Vol (A4C):   56.0 ml 23.43 ml/m LA Biplane Vol: 69.3 ml 28.99 ml/m  AORTIC VALVE LVOT Vmax:   83.60 cm/s LVOT Vmean:  52.900 cm/s LVOT VTI:    0.192 m  AORTA Ao Root diam: 3.70 cm Ao Asc diam:  3.50 cm MITRAL VALVE               TRICUSPID VALVE MV Area (PHT): 2.80 cm    TR Peak grad:   27.2 mmHg MV Decel Time: 271 msec    TR Vmax:        261.00 cm/s MV E velocity: 72.40 cm/s MV A velocity: 71.10 cm/s  SHUNTS MV E/A ratio:  1.02        Systemic VTI:  0.19 m                            Systemic Diam: 2.10 cm Cherlynn Kaiser MD Electronically signed by Cherlynn Kaiser MD Signature Date/Time: 10/03/2022/12:30:47 PM    Final     Labs: BMET Recent Labs  Lab 10/02/22 0420 10/03/22 0601 10/04/22 0440  NA 143 141 143  K 5.1 4.7 4.5  CL 107 109 110  CO2 '23 22 23  '$ GLUCOSE 120* 120* 95  BUN 47* 50* 50*  CREATININE 11.00* 11.03* 11.30*  CALCIUM 9.0 8.4* 8.5*  PHOS  --  5.4* 5.9*   CBC Recent Labs  Lab 10/02/22 0420  WBC 5.9  NEUTROABS 4.1  HGB 9.7*  HCT 29.7*  MCV 88.9  PLT 215    Medications:     amLODipine  10 mg Oral Daily   atorvastatin  40 mg Oral Daily   carvedilol  25 mg Oral BID   ezetimibe  10 mg Oral Daily   heparin  5,000 Units Subcutaneous Q8H   levETIRAcetam  500 mg Oral BID   sertraline  100 mg Oral Daily   sodium bicarbonate  1,300 mg Oral BID   sodium  chloride flush  3 mL Intravenous Q12H    Madelon Lips MD 10/04/2022, 2:46 PM

## 2022-10-04 NOTE — Plan of Care (Signed)
  Problem: Education: Goal: Knowledge of General Education information will improve Description Including pain rating scale, medication(s)/side effects and non-pharmacologic comfort measures Outcome: Progressing   Problem: Health Behavior/Discharge Planning: Goal: Ability to manage health-related needs will improve Outcome: Progressing   

## 2022-10-04 NOTE — Progress Notes (Signed)
Mobility Specialist Progress Note:   10/04/22 1030  Mobility  Activity  (bed levl ex.)  Range of Motion/Exercises Active;All extremities  Activity Response Tolerated fair  Mobility Referral Yes  $Mobility charge 1 Mobility   Pt adamantly refusing all OOB mobility at this time despite max encouragement. Pt able to perform multiple BLE strengthening exercises, then starts to get agitated. Pt left with all needs met, educated on importance of OOB mobility. Pt states he will get up later today.   Nelta Numbers Acute Rehab Secure Chat or Office Phone: [phone]

## 2022-10-04 NOTE — Consult Note (Signed)
Consultation Note Date: 10/04/2022   Patient Name: Brandon [name]  DOB: [birth year]  MRN: [id number]  Age / Sex: 61 y.o., male  PCP: Ronnell Freshwater, NP Referring Physician: Terrilee Croak, MD  Reason for Consultation: Establishing goals of care  HPI/Patient Profile: 61 y.o. male  with past medical history of type 2 diabetes, HTN, HLD, diastolic CHF, CVA with associated seizures/residual deficits (2014), TBI, bilateral renal cell carcinoma status post ablation, and hypothyroidism with CKD stage IV admitted on 10/02/2022 with shortness of breath with bilateral lower extremity swelling and wounds with intermittent chest pain, productive cough, nausea, vomiting, and generalized malaise.  Palliative medicine team was consulted to discuss goals of care in light of patient continuing to refuse hemodialysis.  Clinical Assessment and Goals of Care: I have reviewed medical records including EPIC notes, labs and imaging, assessed the patient and then met with patient and his significant other [name] to discuss diagnosis prognosis, GOC, EOL wishes, disposition and options.  I introduced Palliative Medicine as specialized medical care for people living with serious illness. It focuses on providing relief from the symptoms and stress of a serious illness. The goal is to improve quality of life for both the patient and the family.  We discussed a brief life review of the patient.  Patient has been disabled for several years.  He has been living and been taking care of by [facility] for an extended amount of time.  He has named her as his medical healthcare power of attorney and his living well.  I requested a copy of paperwork to be given so it can be downloaded to patient's electronic chart.  We discussed patient's current illness and what it means in the larger context of patient's on-going co-morbidities.  ESRD and hemodialysis  discussed in detail.  Pros and cons of HD reviewed. Natural disease trajectory of ESRD without HD and expectations at EOL were discussed.  I attempted to elicit values and goals of care important to the patient.  Patient shares she knows that God can heal him and that perhaps God is just going to heal him in a different way.  Patient shared she needed time to process and think about his options.  He shares that after discussion with nephrology today he is excepting of hemodialysis.  He recognizes that without hemodialysis he will have a significantly shorter span of life.  He shares he still has more life to live and is grateful and excepting of starting hemodialysis when appropriate.  Advance directives, concepts specific to code status, artificial feeding and hydration, and rehospitalization were considered and discussed.  Patient shares he is accepting of any and all offered, available, and appropriate measures to sustain his life.  CPR, defibrillation, and use of mechanical ventilation reviewed in detail.    Patient shares he would like any and all attempts made to sustain his life and that he would like to live indefinitely on a ventilator if needed.  He shares he relies on God to heal him and that God  will take him when it is his time.  Full code and full scope remains.  Family is facing treatment option decisions, advanced directive, and anticipatory care needs.  Patient and [name] are concerned that he has 2 flights of stairs to get up and down into his condo.  Discussed that PT and OT recommendations will help guide disposition decisions and that TOC is following closely.   Discussed with patient/Lisa the importance of continued conversation with family and the medical providers regarding overall plan of care and treatment options, ensuring decisions are within the context of the patient's values and GOCs.    Therapeutic silence, active listening, and emotional support provided.  Attending and  RN made aware that patient would like to move forward with hemodialysis.  Questions and concerns were addressed. The patient and [name] were  encouraged to call with questions or concerns.  PMT will continue to follow the patient throughout his hospitalization and support him and goals of care discussions.  Primary Decision Maker PATIENT  Physical Exam Vitals reviewed.  Constitutional:      General: He is not in acute distress.    Appearance: He is obese. He is not ill-appearing.  HENT:     Head: Normocephalic.  Cardiovascular:     Rate and Rhythm: Normal rate.  Pulmonary:     Effort: Pulmonary effort is normal.  Abdominal:     Palpations: Abdomen is soft.  Musculoskeletal:     Cervical back: Normal range of motion.     Comments: Generalized weakness  Skin:    General: Skin is warm and dry.     Comments: Bilateral LE +2 edema  Neurological:     Mental Status: He is alert and oriented to person, place, and time.  Psychiatric:        Mood and Affect: Mood normal. Mood is not anxious.        Behavior: Behavior normal. Behavior is not agitated.    Palliative Assessment/Data: 60%     Thank you for this consult. Palliative medicine will continue to follow and assist holistically.   Time Total: 75 minutes Greater than 50%  of this time was spent counseling and coordinating care related to the above assessment and plan.  Signed by: Jordan Hawks, DNP, FNP-BC Palliative Medicine    Please contact Palliative Medicine Team phone at [phone] for questions and concerns.  For individual provider: See Shea Evans

## 2022-10-05 ENCOUNTER — Encounter (HOSPITAL_COMMUNITY): Payer: Self-pay | Admitting: Internal Medicine

## 2022-10-05 DIAGNOSIS — N179 Acute kidney failure, unspecified: Secondary | ICD-10-CM | POA: Diagnosis not present

## 2022-10-05 DIAGNOSIS — I509 Heart failure, unspecified: Secondary | ICD-10-CM | POA: Diagnosis not present

## 2022-10-05 DIAGNOSIS — I129 Hypertensive chronic kidney disease with stage 1 through stage 4 chronic kidney disease, or unspecified chronic kidney disease: Secondary | ICD-10-CM | POA: Diagnosis not present

## 2022-10-05 DIAGNOSIS — N186 End stage renal disease: Secondary | ICD-10-CM | POA: Diagnosis not present

## 2022-10-05 DIAGNOSIS — Z992 Dependence on renal dialysis: Secondary | ICD-10-CM | POA: Diagnosis not present

## 2022-10-05 DIAGNOSIS — J9601 Acute respiratory failure with hypoxia: Secondary | ICD-10-CM | POA: Diagnosis not present

## 2022-10-05 DIAGNOSIS — F419 Anxiety disorder, unspecified: Secondary | ICD-10-CM | POA: Diagnosis not present

## 2022-10-05 LAB — RENAL FUNCTION PANEL
Albumin: 2.9 g/dL — ABNORMAL LOW (ref 3.5–5.0)
Anion gap: 11 (ref 5–15)
BUN: 50 mg/dL — ABNORMAL HIGH (ref 6–20)
CO2: 23 mmol/L (ref 22–32)
Calcium: 8.3 mg/dL — ABNORMAL LOW (ref 8.9–10.3)
Chloride: 109 mmol/L (ref 98–111)
Creatinine, Ser: 11.54 mg/dL — ABNORMAL HIGH (ref 0.61–1.24)
GFR, Estimated: 5 mL/min — ABNORMAL LOW (ref >60)
Glucose, Bld: 100 mg/dL — ABNORMAL HIGH (ref 70–99)
Phosphorus: 6.1 mg/dL — ABNORMAL HIGH (ref 2.5–4.6)
Potassium: 4.6 mmol/L (ref 3.5–5.1)
Sodium: 143 mmol/L (ref 135–145)

## 2022-10-05 LAB — HEPATITIS B SURFACE ANTIGEN: Hepatitis B Surface Ag: NONREACTIVE

## 2022-10-05 MED ORDER — ORAL CARE MOUTH RINSE: 15.0000 mL | OROMUCOSAL | Status: DC | PRN

## 2022-10-05 MED ORDER — CHLORHEXIDINE GLUCONATE CLOTH 2 % EX PADS
6.0000 | MEDICATED_PAD | Freq: Every day | CUTANEOUS | Status: DC
  Administered 2022-10-06 – 2022-10-16 (×8): 6 via TOPICAL

## 2022-10-05 NOTE — Progress Notes (Signed)
PT Cancellation/DC Note  Patient Details Name: Brandon [name] MRN: [id number] DOB: July 01, 1961   Cancelled Treatment:    Reason Eval/Treat Not Completed: Patient declined, no reason specified. Pt has continued to adamantly refuse PT eval. He states he will be all right when he gets home. Expressed to him concern that he has been in the bed x 3 days and that he has 2 flights of stairs at home. Pt continues to refuse and expresses no interest in participating in PT. Will sign off at this time as pt has refused 3 consecutive days. Can reorder PT if pt is agreeable.   Shary Decamp [facility] 10/05/2022, 9:44 AM [place] Office [phone]

## 2022-10-05 NOTE — TOC Initial Note (Signed)
Transition of Care [facility]) - Initial/Assessment Note    Patient Details  Name: Brandon [name] MRN: [id number] Date of Birth: 27-May-1961  Transition of Care [facility]) CM/SW Contact:    Cyndi Bender, RN Phone Number: 10/05/2022, 2:44 PM  Clinical Narrative:                 Spoke to patient and friend, lisa, regarding transition needs. [name] lives with patient and will take him to dialysis.  Patient uses cane at home and also has walker at home. Patient currently on  02. TOC will follow for home 02 needs.  Expected Discharge Plan: Home/Self Care Barriers to Discharge: Continued Medical Work up   Patient Goals and CMS Choice Patient states their goals for this hospitalization and ongoing recovery are:: return home      Expected Discharge Plan and Services Expected Discharge Plan: Home/Self Care   Discharge Planning Services: CM Consult   Living arrangements for the past 2 months: Apartment                                      Prior Living Arrangements/Services Living arrangements for the past 2 months: Apartment Lives with:: Significant Other Patient language and need for interpreter reviewed:: Yes Do you feel safe going back to the place where you live?: Yes      Need for Family Participation in Patient Care: Yes (Comment) Care giver support system in place?: Yes (comment) Current home services: DME (walker, cane) Criminal Activity/Legal Involvement Pertinent to Current Situation/Hospitalization: No - Comment as needed  Activities of Daily Living Home Assistive Devices/Equipment: Cane (specify quad or straight) ADL Screening (condition at time of admission) Patient's cognitive ability adequate to safely complete daily activities?: Yes Is the patient deaf or have difficulty hearing?: No Does the patient have difficulty seeing, even when wearing glasses/contacts?: No Does the patient have difficulty concentrating, remembering, or making decisions?: No Patient  able to express need for assistance with ADLs?: Yes Does the patient have difficulty dressing or bathing?: Yes Independently performs ADLs?: No Does the patient have difficulty walking or climbing stairs?: Yes Weakness of Legs: Both Weakness of Arms/Hands: Both  Permission Sought/Granted                  Emotional Assessment Appearance:: Appears stated age Attitude/Demeanor/Rapport: Engaged Affect (typically observed): Accepting Orientation: : Oriented to Self, Oriented to Place, Oriented to  Time, Oriented to Situation Alcohol / Substance Use: Not Applicable Psych Involvement: No (comment)  Admission diagnosis:  Shortness of breath [R06.02] Acute respiratory failure with hypoxia (HCC) [J96.01] Acute renal failure superimposed on stage 5 chronic kidney disease, not on chronic dialysis, unspecified acute renal failure type ([place]) [N17.9, N18.5] Acute on chronic congestive heart failure, unspecified heart failure type ([place]) [I50.9] Patient Active Problem List   Diagnosis Date Noted   Acute respiratory failure with hypoxia ([place]) 10/02/2022   Hypertensive urgency 10/02/2022   Acute on chronic diastolic CHF (congestive heart failure) ([place]) 10/02/2022   Anemia due to chronic kidney disease 10/02/2022   Cognitive impairment 10/02/2022   Diabetic nephropathy associated with type 2 diabetes mellitus ([place]) 04/28/2022   Iron deficiency anemia 04/28/2022   Obesity (BMI 30-39.9) 04/17/2022   Protein-calorie malnutrition, severe 12/01/2021   Acute renal failure superimposed on stage 4 chronic kidney disease ([place]) 11/30/2021   AMS (altered mental status) 11/29/2021   Hypokalemia 11/29/2021   Callus 10/19/2021  Enlarged and hypertrophic nails 10/19/2021   Open wound of toe 10/19/2021   Mental health disorder 10/19/2021   Delirium 09/21/2021   Polypharmacy 09/18/2020   Anemia 04/16/2020   Anxiety 04/16/2020   Controlled type 2 diabetes mellitus without complication, without long-term  current use of insulin ([place]) 02/09/2020   Type 2 diabetes mellitus with stage 3a chronic kidney disease, with long-term current use of insulin ([place]) 02/09/2020   Family history of premature CAD 01/27/2020   Stage 3b chronic kidney disease ([place]) 01/09/2020   Incontinence of feces 01/09/2020   Chronic pain of left knee 01/09/2020   Fall 01/09/2020   History of diverticulitis 10/30/2019   Noncompliance 06/10/2019   History of renal cell cancer 04/24/2018   Erectile dysfunction 04/24/2018   Edema 03/15/2018   Seizure disorder ([place]) 03/01/2018   History of stroke 11/29/2017   Obstructive sleep apnea 11/29/2017   Hyperlipidemia associated with type 2 diabetes mellitus ([place]) 05/10/2017   Hypertension associated with diabetes ([place]) 05/10/2017   History of cerebrovascular accident (CVA) with residual deficit 05/10/2017   Diabetic ulcer of left foot associated with secondary diabetes mellitus ([place]) 04/21/2016   Depression, recurrent ([place]) 09/21/2014   PCP:  Ronnell Freshwater, NP Pharmacy:   [id number] [address] [address]Phone: [phone]: [phone]    Social Determinants of Health (SDOH) Interventions    Readmission Risk Interventions    07/09/2021    3:47 PM  Readmission Risk Prevention Plan  Transportation Screening Complete  PCP or Specialist Appt within 3-5 Days Complete  HRI or Home Care Consult Complete  Social Work Consult for [place]/Counseling Complete  Palliative Care Screening Not Applicable

## 2022-10-05 NOTE — Consult Note (Signed)
Chief Complaint: Renal failure  Referring Physician(s): Madelon Lips  Supervising Physician: Markus Daft  Patient Status: [facility] - In-pt  History of Present Illness: Brandon [name] is a 61 y.o. male with progressive renal failure.  Kidney disease is likely secondary to hypertension/diabetes.  He also has h/o renal cell cancer and is s/p cryoablation.  He presented to the hospital on 10/02/22 with increasing SOB and leg swelling.  Diuresis has been attempted but he now needs dialysis.  He is NPO.   Past Medical History:  Diagnosis Date   Acute ischemic stroke ([place]) 11/2013   Allergy    Anxiety    Arthritis    Benign hypertension with CKD (chronic kidney disease) stage III (HCC)    Bil Renal Ca dx'd 09/2011 & 11/2011   left and right; cryoablation bil   Depression    BH Adm in [place] Depression   Diabetes mellitus    DKA prior hospitalization   Diverticulitis    s/p micorperforation Sept 2012-managed conservatively by Gen surgery   ED (erectile dysfunction)    Focal seizure ([place]) 11/2013   due to ischemic stroke   GERD (gastroesophageal reflux disease)    Hiatal hernia    Hyperlipidemia    Hypertension    Seizures ([place])    none since 2016, taking Keppra - maw   Sleep apnea    Thyroid disease    "weak thyroid" per MD   Wears glasses     Past Surgical History:  Procedure Laterality Date   COLONOSCOPY     IR RADIOLOGIST EVAL & MGMT  04/04/2017   KIDNEY SURGERY     ablation of renal cell CA - 12/28, prior one was October [place]   TEE WITHOUT CARDIOVERSION N/A 11/19/2013   Procedure: TRANSESOPHAGEAL ECHOCARDIOGRAM (TEE);  Surgeon: Lelon Perla, MD;  Location: [facility] ENDOSCOPY;  Service: Cardiovascular;  Laterality: N/A;    Allergies: Hydralazine, Imdur [isosorbide nitrate], and Morphine  Medications: Prior to Admission medications   Medication Sig Start Date End Date Taking? Authorizing Provider  amLODipine (NORVASC) 10 MG tablet  Take 2 tablets (20 mg total) by mouth daily. Patient taking differently: Take 10 mg by mouth daily. 08/01/22  Yes Boscia, Greer Ee, NP  atorvastatin (LIPITOR) 40 MG tablet Take 1 tablet (40 mg total) by mouth daily. 08/01/22  Yes Boscia, Greer Ee, NP  carvedilol (COREG) 25 MG tablet Take 25 mg by mouth 2 (two) times daily. 09/11/22  Yes [provider]  ezetimibe (ZETIA) 10 MG tablet Take 1 tablet (10 mg total) by mouth daily. 10/19/21 10/03/23 Yes Tysinger, Camelia Eng, PA-C  furosemide (LASIX) 80 MG tablet Take 80 mg by mouth 2 (two) times daily. 08/21/22  Yes [provider]  HYDROcodone-acetaminophen (NORCO) 5-325 MG tablet Take 1 tablet by mouth every 6 (six) hours as needed for moderate pain. 03/18/22  Yes Horton, Alvin Critchley, DO  levETIRAcetam (KEPPRA) 500 MG tablet Take 2 tablets (1,000 mg total) by mouth daily. Patient taking differently: Take 500 mg by mouth 2 (two) times daily. 10/19/21 10/03/23 Yes Tysinger, Camelia Eng, PA-C  sertraline (ZOLOFT) 100 MG tablet Take 1 tablet (100 mg total) by mouth daily. 05/30/22  Yes Boscia, Heather E, NP  sodium bicarbonate 650 MG tablet Take 1,300 mg by mouth 2 (two) times daily. 07/08/22  Yes [provider]  blood glucose meter kit and supplies Dispense based on patient and insurance preference. Use up to four times daily as directed. (FOR ICD-9 250.00,  250.01). Patient taking differently: 1 each by Other route See admin instructions. Dispense based on patient and insurance preference. Use up to four times daily as directed. (FOR ICD-9 250.00, 250.01). 04/24/16   Eugenie Filler, MD  glucose blood [facility] ULTRA) test strip Blood sugar testing done TID for insulin dependant Type @ diabetes - E11.65 04/12/22   Ronnell Freshwater, NP  Insulin Pen Needle (NOVOFINE PLUS PEN NEEDLE) 32G X 4 MM MISC Use as directed with insulin pen 07/10/21   [place] Donnamarie Poag, DO  Lancets (ACCU-CHEK SOFT TOUCH) lancets Use as instructed Patient taking differently:  1 each by Other route as directed. 06/11/19   Tysinger, Camelia Eng, PA-C  potassium chloride (KLOR-CON) 10 MEQ tablet Take 10 mEq by mouth daily. 07/29/22   [provider]     Family History  Problem Relation Age of Onset   Diabetes Father    Heart disease Father    Pneumonia Mother    Prostate cancer Other    Stomach cancer Other    Breast cancer Sister    Colon cancer Neg Hx    Esophageal cancer Neg Hx    Rectal cancer Neg Hx     Social History   Socioeconomic History   Marital status: Single    Spouse name: Not on file   Number of children: 2   Years of education: college   Highest education level: Not on file  Occupational History   Occupation: disabled  Tobacco Use   Smoking status: Never   Smokeless tobacco: Never  Vaping Use   Vaping Use: Never used  Substance and Sexual Activity   Alcohol use: Not Currently    Comment: rarely   Drug use: No   Sexual activity: Never  Other Topics Concern   Not on file  Social History Narrative   Patient lives alone.   Education. Two years of college.   Not working.   Right handed.   Caffeine one soda daily. Drinks coffee    Social Determinants of Health   Financial Resource Strain: Low Risk  (08/01/2022)   Overall Financial Resource Strain (CARDIA)    Difficulty of Paying Living Expenses: Not very hard  Food Insecurity: No Food Insecurity (10/03/2022)   Hunger Vital Sign    Worried About Running Out of Food in the Last Year: Never true    Ran Out of Food in the Last Year: Never true  Transportation Needs: No Transportation Needs (10/03/2022)   PRAPARE - Hydrologist (Medical): No    Lack of Transportation (Non-Medical): No  Physical Activity: Insufficiently Active (08/01/2022)   Exercise Vital Sign    Days of Exercise per Week: 1 day    Minutes of Exercise per Session: 10 min  Stress: No Stress Concern Present (08/01/2022)   [place]    Feeling of Stress : Only a little  Social Connections: Socially Isolated (08/01/2022)   Social Connection and Isolation Panel [NHANES]    Frequency of Communication with Friends and Family: Once a week    Frequency of Social Gatherings with Friends and Family: Once a week    Attends Religious Services: 1 to 4 times per year    Active Member of [employer] or Organizations: No    Attends Archivist Meetings: Never    Marital Status: Never married     Review of Systems: A 12 point ROS discussed and pertinent positives are indicated.  All other systems are negative.  Review of Systems  Constitutional:  Positive for activity change.  Respiratory:  Positive for shortness of breath.     Vital Signs: BP (!) 159/79 (BP Location: Left Arm)   Pulse 61   Temp 98 F (36.7 C) (Oral)   Resp 18   Ht 6' (1.829 m)   Wt 268 lb 11.9 oz (121.9 kg)   SpO2 97%   BMI 36.45 kg/m   Physical Exam Vitals reviewed.  Constitutional:      Appearance: Normal appearance.  HENT:     Head: Normocephalic and atraumatic.  Eyes:     Extraocular Movements: Extraocular movements intact.  Cardiovascular:     Rate and Rhythm: Normal rate and regular rhythm.  Pulmonary:     Effort: Pulmonary effort is normal. No respiratory distress.     Breath sounds: Normal breath sounds.  Abdominal:     General: There is no distension.     Palpations: Abdomen is soft.     Tenderness: There is no abdominal tenderness.  Musculoskeletal:        General: Normal range of motion.     Cervical back: Normal range of motion.  Skin:    General: Skin is warm and dry.  Neurological:     General: No focal deficit present.     Mental Status: He is alert and oriented to person, place, and time.  Psychiatric:        Mood and Affect: Mood normal.        Behavior: Behavior normal.        Thought Content: Thought content normal.        Judgment: Judgment normal.     Imaging: ECHOCARDIOGRAM  COMPLETE  Result Date: 10/03/2022    ECHOCARDIOGRAM REPORT   Patient Name:   Brandon [name] Date of Exam: 10/03/2022 Medical Rec #:  [id number]         Height:       72.0 in Accession #:    [id number]        Weight:       262.1 lb Date of Birth:  October 14, 1961          BSA:          2.390 m Patient Age:    8 years          BP:           190/110 mmHg Patient Gender: M                 HR:           57 bpm. Exam Location:  Inpatient Procedure: 2D Echo, Cardiac Doppler and Color Doppler Indications:    I50.40* Unspecified combined systolic (congestive) and diastolic                 (congestive) heart failure  History:        Patient has prior history of Echocardiogram examinations, most                 recent 11/19/2013. Stroke, Signs/Symptoms:Altered Mental Status;                 Risk Factors:Diabetes.  Sonographer:    Roseanna Rainbow RDCS Referring Phys: [id number] RONDELL A SMITH  Sonographer Comments: Technically difficult study due to poor echo windows. Image acquisition challenging due to patient body habitus. Attempted to turn. IMPRESSIONS  1. Left ventricular ejection fraction, by estimation, is 50 to 55%. The left ventricle  has low normal function. Left ventricular endocardial border not optimally defined to evaluate regional wall motion. There is moderate left ventricular hypertrophy. Left ventricular diastolic parameters were low normal for age.  2. Right ventricular systolic function is normal. The right ventricular size is normal. There is normal pulmonary artery systolic pressure. The estimated right ventricular systolic pressure is 55.7 mmHg.  3. The mitral valve is normal in structure. Trivial mitral valve regurgitation. No evidence of mitral stenosis.  4. The aortic valve is grossly normal. Aortic valve regurgitation is not visualized. No aortic stenosis is present.  5. The inferior vena cava is dilated in size with >50% respiratory variability, suggesting right atrial pressure of 8 mmHg. FINDINGS  Left  Ventricle: Left ventricular ejection fraction, by estimation, is 50 to 55%. The left ventricle has low normal function. Left ventricular endocardial border not optimally defined to evaluate regional wall motion. The left ventricular internal cavity  size was normal in size. There is moderate left ventricular hypertrophy. Left ventricular diastolic parameters were normal. Right Ventricle: The right ventricular size is normal. No increase in right ventricular wall thickness. Right ventricular systolic function is normal. There is normal pulmonary artery systolic pressure. The tricuspid regurgitant velocity is 2.61 m/s, and  with an assumed right atrial pressure of 8 mmHg, the estimated right ventricular systolic pressure is 32.2 mmHg. Left Atrium: Left atrial size was normal in size. Right Atrium: Right atrial size was normal in size. Pericardium: There is no evidence of pericardial effusion. Mitral Valve: The mitral valve is normal in structure. Trivial mitral valve regurgitation. No evidence of mitral valve stenosis. Tricuspid Valve: The tricuspid valve is normal in structure. Tricuspid valve regurgitation is mild . No evidence of tricuspid stenosis. Aortic Valve: The aortic valve is grossly normal. Aortic valve regurgitation is not visualized. No aortic stenosis is present. Pulmonic Valve: The pulmonic valve was normal in structure. Pulmonic valve regurgitation is not visualized. No evidence of pulmonic stenosis. Aorta: The aortic root is normal in size and structure. Ascending aorta measurements are within normal limits for age when indexed to body surface area. Venous: The inferior vena cava is dilated in size with greater than 50% respiratory variability, suggesting right atrial pressure of 8 mmHg. IAS/Shunts: No atrial level shunt detected by color flow Doppler. Additional Comments: There is pleural effusion in the left lateral region.  LEFT VENTRICLE PLAX 2D LVIDd:         4.50 cm      Diastology LVIDs:          2.95 cm      LV e' medial:    5.52 cm/s LV PW:         1.50 cm      LV E/e' medial:  13.1 LV IVS:        1.55 cm      LV e' lateral:   8.84 cm/s LVOT diam:     2.10 cm      LV E/e' lateral: 8.2 LV SV:         67 LV SV Index:   28 LVOT Area:     3.46 cm  LV Volumes (MOD) LV vol d, MOD A2C: 191.0 ml LV vol d, MOD A4C: 109.0 ml LV vol s, MOD A2C: 92.5 ml LV vol s, MOD A4C: 41.5 ml LV SV MOD A2C:     98.5 ml LV SV MOD A4C:     109.0 ml LV SV MOD BP:      81.8 ml  RIGHT VENTRICLE            IVC RV S prime:     9.00 cm/s  IVC diam: 2.30 cm TAPSE (M-mode): 1.5 cm LEFT ATRIUM             Index        RIGHT ATRIUM           Index LA diam:        4.70 cm 1.97 cm/m   RA Area:     21.30 cm LA Vol (A2C):   81.1 ml 33.93 ml/m  RA Volume:   67.30 ml  28.16 ml/m LA Vol (A4C):   56.0 ml 23.43 ml/m LA Biplane Vol: 69.3 ml 28.99 ml/m  AORTIC VALVE LVOT Vmax:   83.60 cm/s LVOT Vmean:  52.900 cm/s LVOT VTI:    0.192 m  AORTA Ao Root diam: 3.70 cm Ao Asc diam:  3.50 cm MITRAL VALVE               TRICUSPID VALVE MV Area (PHT): 2.80 cm    TR Peak grad:   27.2 mmHg MV Decel Time: 271 msec    TR Vmax:        261.00 cm/s MV E velocity: 72.40 cm/s MV A velocity: 71.10 cm/s  SHUNTS MV E/A ratio:  1.02        Systemic VTI:  0.19 m                            Systemic Diam: 2.10 cm Cherlynn Kaiser MD Electronically signed by Cherlynn Kaiser MD Signature Date/Time: 10/03/2022/12:30:47 PM    Final    DG Chest 2 View  Result Date: 10/02/2022 CLINICAL DATA:  61 year old male with history of chest pain and dyspnea. EXAM: CHEST - 2 VIEW COMPARISON:  Chest x-ray 04/20/2022. FINDINGS: Lung volumes are low. Bibasilar opacities may reflect areas of atelectasis and/or consolidation, with superimposed small bilateral pleural effusions (left-greater-than-right). No pneumothorax. No evidence of pulmonary edema. Heart size is normal. Upper mediastinal contours are within normal limits. IMPRESSION: 1. Low lung volumes with bibasilar areas of  atelectasis and/or consolidation and superimposed small bilateral pleural effusions (left-greater-than-right). Electronically Signed   By: Vinnie Langton M.D.   On: 10/02/2022 05:41    Labs:  CBC: Recent Labs    02/01/22 1543 03/18/22 0928 04/19/22 1033 06/28/22 1249 07/27/22 1259 08/10/22 1252 09/23/22 1401 10/02/22 0420  WBC 5.2 5.2 6.0  --   --   --   --  5.9  HGB 9.8* 9.9* 10.1*   < > 10.3* 9.1* 9.4* 9.7*  HCT 27.7* 30.1* 30.2*  --   --   --   --  29.7*  PLT 207 204 229  --   --   --   --  215   < > = values in this interval not displayed.    COAGS: Recent Labs    11/29/21 0228  INR 1.2  APTT 28    BMP: Recent Labs    10/02/22 0420 10/03/22 0601 10/04/22 0440 10/05/22 0402  NA 143 141 143 143  K 5.1 4.7 4.5 4.6  CL 107 109 110 109  CO2 _0 GLUCOSE 120* 120* 95 100*  BUN 47* 50* 50* 50*  CALCIUM 9.0 8.4* 8.5* 8.3*  CREATININE 11.00* 11.03* 11.30* 11.54*  GFRNONAA 5* 5* 5* 5*    LIVER FUNCTION TESTS: Recent Labs    11/30/21 6269  03/18/22 0928 04/19/22 1033 10/02/22 0420 10/03/22 0601 10/04/22 0440 10/05/22 0402  BILITOT 1.0 0.6 0.4 1.5*  --   --   --   AST _0 9*  --   --   --   ALT _1 --   --   --   ALKPHOS 122 95 96 71  --   --   --   PROT 6.5 7.2 6.8 7.7  --   --   --   ALBUMIN 2.9* 3.5 3.8 3.4* 2.8* 3.0* 2.9*    TUMOR MARKERS: No results for input(s): "AFPTM", "CEA", "CA199", "CHROMGRNA" in the last 8760 hours.  Assessment and Plan:  Progressive renal failure now needing hemodialysis.  Will proceed with image guided placement of a tunneled hemodialysis catheter today by Dr. Anselm Pancoast as IR schedule allows.  Risks and benefits discussed with the patient including, but not limited to bleeding, infection, vascular injury, pneumothorax which may require chest tube placement, air embolism or even death  All of the patient's questions were answered, patient is agreeable to proceed. Consent signed and in chart.  Thank  you for allowing our service to participate in Brandon [name] 's care.  Electronically Signed: Murrell Redden, PA-C   10/05/2022, 2:34 PM      I spent a total of 20 Minutes  in face to face in clinical consultation, greater than 50% of which was counseling/coordinating care for Hd catheter placement.

## 2022-10-05 NOTE — Progress Notes (Signed)
                                                     Palliative Care Progress Note, Assessment & Plan   Patient Name: Brandon [name]       Date: 10/05/2022 DOB: Jun 28, 1961  Age: 61 y.o. MRN#: [id number] Attending Physician: Terrilee Croak, MD Primary Care Physician: Ronnell Freshwater, NP Admit Date: 10/02/2022  Reason for Consultation/Follow-up: Establishing goals of care  Summary of counseling/coordination of care: After reviewing the patient's chart and assessing the patient at bedside, I spoke with patient and his significant other [name] about short-term goals.  Patient has made it clear to medical team that he wants to move forward with hemodialysis. However, discussion of other interventions and treatments were discussed.     We discussed that in addition to HD, patient will need evaluation by PT/OT in order to determine DME and disposition.  Patient shares that he is willing to work with PT/OT.  However, we discussed patient declined to work with PT this AM.  Patient states he now recognizes the need to work with these rehab services and that he is on board with working with PT moving forward.  Therapeutic silence and active listening provided for patient to share his thoughts and emotions regarding current medical situation.  Emotional support provided.    He continues to say that he does not want to get in his own way and that he wants to do things right.  He remains appreciative of all efforts to help make him better and understands that moving forward he is agreeable to physical and occupational therapies/evaluations.  Physical Exam Vitals reviewed.  Constitutional:      General: He is not in acute distress.    Appearance: He is obese. He is not ill-appearing.  HENT:     Head: Normocephalic.  Cardiovascular:     Rate and Rhythm: Normal rate.   Pulmonary:     Effort: Pulmonary effort is normal.  Abdominal:     Palpations: Abdomen is soft.  Musculoskeletal:     Comments: MAETC  Skin:    General: Skin is warm and dry.  Neurological:     Mental Status: He is alert and oriented to person, place, and time.  Psychiatric:        Mood and Affect: Mood normal. Mood is not anxious.        Behavior: Behavior normal. Behavior is not agitated.             Palliative Assessment/Data: 60%    Total Time 35 minutes  Greater than 50%  of this time was spent counseling and coordinating care related to the above assessment and plan.  Thank you for allowing the Palliative Medicine Team to assist in the care of this patient.  [place] Ilsa Iha, FNP-BC Palliative Medicine Team Team Phone # [phone]

## 2022-10-05 NOTE — Plan of Care (Signed)

## 2022-10-05 NOTE — Care Management Important Message (Signed)
Important Message  Patient Details  Name: Brandon [name] MRN: [id number] Date of Birth: 06-24-1961   Medicare Important Message Given:  Yes     Orbie Pyo 10/05/2022, 2:42 PM

## 2022-10-05 NOTE — Progress Notes (Signed)
[name] Progress Note   Assessment/ Plan:   **AHRF: requiring O2 to maintain sats - CXR with small effusions, exam suggests mild pum edema.  HFpEF +CKD 5 main issues.  Diurese and follow - may need to see if effusions are amenable to tap if persistently hypoxic.  - continue lasix 120 IV BID (was on lasix 80 po BID). 2g sodium, fluid restricted diet.  Weight daily, I/os.    **CKD 5: Advanced CKD at baseline 04/2022 GFR 10, now 5 - underlying issues HTN, h/o BL ablations.  Followed at [place] but last OV was 06/2022 - hasn't attended recommended f/u.  No emergency indications for dialysis but I recommended to do so in light of progressive decline in GFR over past 6 months from 11 to 5, volume issues and low grade uremic symptoms (dysgeusia, nausea).    - was initially declining dialysis after conversations with me and Brandon [name] - do not expect any of his mild uremic symptoms or his volume to get much better until it's more definitively managed - palliative care c/s--> GOC have been clarified to do dialysis- greatly appreciate assistance - ir c/s for Brandon [name], greatly appreciate assistance - will need VVS c/s too and vein mapping   **HTN:  home meds have been resume, follow with diuresis   **h/o CVA 2014   **Anemia:  f/b ESA clinic outpt - hb acceptable.     Will follow, call with concerns.   Subjective:    Seen in room.  Long discussion with pt and SO today about catheter insertion and starting HD.  This is piggybacking off a long discussion with palliative care yesterday.  He is ready to proceed now.     Objective:   BP (!) 159/79 (BP Location: Left Arm)   Pulse 61   Temp 98 F (36.7 C) (Oral)   Resp 18   Ht 6' (1.829 m)   Wt 121.9 kg   SpO2 97%   BMI 36.45 kg/m   Intake/Output Summary (Last 24 hours) at 10/05/2022 1313 Last data filed at 10/05/2022 0210 Gross per 24 hour  Intake 134.11 ml  Output --  Net 134.11 ml   Weight change: -61 kg  Physical Exam: Gen:NAD,  sitting in bed CVS:RRR Resp: bibasilar crackles Abd: soft Ext: 3+ pitting edema bilaterally  Imaging: No results found.  Labs: BMET Recent Labs  Lab 10/02/22 0420 10/03/22 0601 10/04/22 0440 10/05/22 0402  NA 143 141 143 143  K 5.1 4.7 4.5 4.6  CL 107 109 110 109  CO2 '23 22 23 23  '$ GLUCOSE 120* 120* 95 100*  BUN 47* 50* 50* 50*  CREATININE 11.00* 11.03* 11.30* 11.54*  CALCIUM 9.0 8.4* 8.5* 8.3*  PHOS  --  5.4* 5.9* 6.1*   CBC Recent Labs  Lab 10/02/22 0420  WBC 5.9  NEUTROABS 4.1  HGB 9.7*  HCT 29.7*  MCV 88.9  PLT 215    Medications:     amLODipine  10 mg Oral Daily   atorvastatin  40 mg Oral Daily   carvedilol  25 mg Oral BID   ezetimibe  10 mg Oral Daily   heparin  5,000 Units Subcutaneous Q8H   influenza vac split quadrivalent PF  0.5 mL Intramuscular Tomorrow-1000   levETIRAcetam  500 mg Oral BID   sertraline  100 mg Oral Daily   sodium bicarbonate  1,300 mg Oral BID   sodium chloride flush  3 mL Intravenous Q12H    Brandon Lips MD 10/05/2022,  1:13 PM

## 2022-10-05 NOTE — Plan of Care (Signed)
  Problem: Activity: Goal: Capacity to carry out activities will improve Outcome: Progressing   Problem: Nutrition: Goal: Adequate nutrition will be maintained Outcome: Progressing
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
## 2022-10-06 ENCOUNTER — Inpatient Hospital Stay (HOSPITAL_COMMUNITY): Payer: Medicare Other

## 2022-10-06 DIAGNOSIS — J9601 Acute respiratory failure with hypoxia: Secondary | ICD-10-CM | POA: Diagnosis not present

## 2022-10-06 HISTORY — PX: IR US GUIDE VASC ACCESS RIGHT: IMG2390

## 2022-10-06 HISTORY — PX: IR FLUORO GUIDE CV LINE RIGHT: IMG2283

## 2022-10-06 LAB — RENAL FUNCTION PANEL
Albumin: 3.1 g/dL — ABNORMAL LOW (ref 3.5–5.0)
Anion gap: 10 (ref 5–15)
BUN: 51 mg/dL — ABNORMAL HIGH (ref 6–20)
CO2: 24 mmol/L (ref 22–32)
Calcium: 8.4 mg/dL — ABNORMAL LOW (ref 8.9–10.3)
Chloride: 108 mmol/L (ref 98–111)
Creatinine, Ser: 11.77 mg/dL — ABNORMAL HIGH (ref 0.61–1.24)
GFR, Estimated: 4 mL/min — ABNORMAL LOW (ref >60)
Glucose, Bld: 104 mg/dL — ABNORMAL HIGH (ref 70–99)
Phosphorus: 6.1 mg/dL — ABNORMAL HIGH (ref 2.5–4.6)
Potassium: 4.7 mmol/L (ref 3.5–5.1)
Sodium: 142 mmol/L (ref 135–145)

## 2022-10-06 LAB — HEPATITIS B SURFACE ANTIBODY, QUANTITATIVE: Hep B S AB Quant (Post): 44 m[IU]/mL (ref >9.9)

## 2022-10-06 LAB — CBC
HCT: 22 % — ABNORMAL LOW (ref 39.0–52.0)
Hemoglobin: 7.2 g/dL — ABNORMAL LOW (ref 13.0–17.0)
MCH: 28.6 pg (ref 26.0–34.0)
MCHC: 32.7 g/dL (ref 30.0–36.0)
MCV: 87.3 fL (ref 80.0–100.0)
Platelets: 157 10*3/uL (ref 150–400)
RBC: 2.52 MIL/uL — ABNORMAL LOW (ref 4.22–5.81)
RDW: 13.4 % (ref 11.5–15.5)
WBC: 3.6 10*3/uL — ABNORMAL LOW (ref 4.0–10.5)
nRBC: 0 % (ref 0.0–0.2)

## 2022-10-06 MED ORDER — FENTANYL CITRATE (PF) 100 MCG/2ML IJ SOLN
INTRAMUSCULAR | Status: AC | PRN
  Administered 2022-10-06: 25 ug via INTRAVENOUS

## 2022-10-06 MED ORDER — CEFAZOLIN SODIUM-DEXTROSE 2-4 GM/100ML-% IV SOLN
INTRAVENOUS | Status: AC
  Administered 2022-10-06: 2 g via INTRAVENOUS
  Filled 2022-10-06: qty 100

## 2022-10-06 MED ORDER — LIDOCAINE-EPINEPHRINE 1 %-1:100000 IJ SOLN
INTRAMUSCULAR | Status: AC
  Filled 2022-10-06: qty 1

## 2022-10-06 MED ORDER — FENTANYL CITRATE (PF) 100 MCG/2ML IJ SOLN
INTRAMUSCULAR | Status: AC
  Filled 2022-10-06: qty 2

## 2022-10-06 MED ORDER — MIDAZOLAM HCL 2 MG/2ML IJ SOLN
INTRAMUSCULAR | Status: AC
  Filled 2022-10-06: qty 2

## 2022-10-06 MED ORDER — HEPARIN SODIUM (PORCINE) 1000 UNIT/ML IJ SOLN
INTRAMUSCULAR | Status: AC
  Filled 2022-10-06: qty 10

## 2022-10-06 MED ORDER — LIDOCAINE-EPINEPHRINE 1 %-1:100000 IJ SOLN
15.0000 mL | Freq: Once | INTRAMUSCULAR | Status: AC
  Filled 2022-10-06: qty 15

## 2022-10-06 MED ORDER — ALTEPLASE 2 MG IJ SOLR: 2.0000 mg | Freq: Once | INTRAMUSCULAR | Status: DC | PRN

## 2022-10-06 MED ORDER — LIDOCAINE-EPINEPHRINE 1 %-1:100000 IJ SOLN
INTRAMUSCULAR | Status: AC
  Administered 2022-10-06: 15 mL via INTRADERMAL
  Filled 2022-10-06: qty 1

## 2022-10-06 MED ORDER — MIDAZOLAM HCL 2 MG/2ML IJ SOLN
INTRAMUSCULAR | Status: AC | PRN
  Administered 2022-10-06: 1 mg via INTRAVENOUS

## 2022-10-06 MED ORDER — CEFAZOLIN SODIUM-DEXTROSE 2-4 GM/100ML-% IV SOLN: 2.0000 g | Freq: Once | INTRAVENOUS | Status: AC

## 2022-10-06 MED ORDER — ANTICOAGULANT SODIUM CITRATE 4% (200MG/5ML) IV SOLN
5.0000 mL | Status: DC | PRN
  Filled 2022-10-06: qty 5

## 2022-10-06 MED ORDER — HEPARIN SODIUM (PORCINE) 1000 UNIT/ML DIALYSIS
1000.0000 [IU] | INTRAMUSCULAR | Status: DC | PRN
  Administered 2022-10-06: 1000 [IU]
  Filled 2022-10-06 (×3): qty 1

## 2022-10-06 NOTE — Evaluation (Signed)
Physical Therapy Evaluation Patient Details Name: Brandon [name] MRN: [id number] DOB: March 24, 1961 Today's Date: 10/06/2022  History of Present Illness  Pt presented to ED on 10/29 with SOB and found to have acute respiratory failure with hypoxia secondary to fluid overload likely due to worsening kidney function. PMH - CVA 2013, anxiety, PTSD, CKD III, bilateral renal cancer, depression, DM, prior hospitalization for DKA, TBI, HTN, DM, DKA and focal seizures (on Keppra),  Clinical Impression  Pt presents to PT with deficits in strength, power, gait, balance, endurance, cardiopulmonary function. Pt ambulates for limited household distances, demonstrating impaired balance and safety awareness as he reaches for furniture to improve stability yet refuses to utilize a walker to provide support. Pt fatigues rapidly, with desaturation to mid 80s. Pt will benefit from aggressive mobilization in an effort to improve strength and activity tolerance. PT will attempt to assess mobility with use of a quad cane next session as the pt seems agreeable to attempting this, resistant to use of RW. PT recommends SNF placement at this time as the pt is at a high risk for falls and reports inconsistent support from his roommate       Recommendations for follow up therapy are one component of a multi-disciplinary discharge planning process, led by the attending physician.  Recommendations may be updated based on patient status, additional functional criteria and insurance authorization.  Follow Up Recommendations Skilled nursing-short term rehab (<3 hours/day) Can patient physically be transported by private vehicle: Yes    Assistance Recommended at Discharge PRN  Patient can return home with the following  A little help with walking and/or transfers;A little help with bathing/dressing/bathroom;Assistance with cooking/housework;Direct supervision/assist for medications management;Direct supervision/assist for financial  management;Assist for transportation;Help with stairs or ramp for entrance    Equipment Recommendations  (TBD, will assess with quad cane as pt requesting this device. Pt seems likely to not utilize a walker, reporting a fear of it after having falls with a walker in the past.)  Recommendations for Other Services       Functional Status Assessment Patient has had a recent decline in their functional status and demonstrates the ability to make significant improvements in function in a reasonable and predictable amount of time.     Precautions / Restrictions Precautions Precautions: Fall Precaution Comments: monitor O2 sats Restrictions Weight Bearing Restrictions: No      Mobility  Bed Mobility Overal bed mobility: Modified Independent                  Transfers Overall transfer level: Needs assistance Equipment used:  (UE support of sink counter when leaning forward to stand) Transfers: Sit to/from Stand Sit to Stand: Min guard                Ambulation/Gait Ambulation/Gait assistance: Min guard Gait Distance (Feet): 20 Feet (additional trial of 15') Assistive device:  (UE support of sink counter, bed rail, furniture. Pt refuses utilization of walker) Gait Pattern/deviations: Step-to pattern, Wide base of support Gait velocity: reduced Gait velocity interpretation: <1.31 ft/sec, indicative of household ambulator   General Gait Details: pt with slowed step-to gait, widened BOS, high guard reaching for UE support  Stairs            Wheelchair Mobility    Modified Rankin (Stroke Patients Only)       Balance Overall balance assessment: Needs assistance Sitting-balance support: No upper extremity supported, Feet supported Sitting balance-Leahy Scale: Good     Standing balance support: Single  extremity supported, Reliant on assistive device for balance Standing balance-Leahy Scale: Poor                               Pertinent  Vitals/Pain Pain Assessment Pain Assessment: Faces Faces Pain Scale: Hurts little more Pain Location: abdomen Pain Descriptors / Indicators: Grimacing Pain Intervention(s): Monitored during session    Home Living Family/patient expects to be discharged to:: Private residence Living Arrangements: Non-relatives/Friends Available Help at Discharge: Friend(s);Available PRN/intermittently Type of Home: Apartment Home Access: Stairs to enter Entrance Stairs-Rails: Right;Left Entrance Stairs-Number of Steps: 2 flights   Home Layout: One level Home Equipment: Cane - single point;Toilet riser      Prior Function Prior Level of Function : Needs assist             Mobility Comments: pt reports ambulating with use of SPC at home ADLs Comments: roommate assists him with lower body dressing, transportation, cooking/cleaning. Bathes in tub on good days, when he feels weakner he bathes at the sink     Hand Dominance   Dominant Hand: Right    Extremity/Trunk Assessment   Upper Extremity Assessment Upper Extremity Assessment: Overall WFL for tasks assessed    Lower Extremity Assessment Lower Extremity Assessment: Generalized weakness    Cervical / Trunk Assessment Cervical / Trunk Assessment: Other exceptions Cervical / Trunk Exceptions: obesity  Communication   Communication: No difficulties  Cognition Arousal/Alertness: Awake/alert Behavior During Therapy: WFL for tasks assessed/performed Overall Cognitive Status: History of cognitive impairments - at baseline                                 General Comments: pt with higher level cognitive deficits at baseline with history of prior CVA. Pt is alert and oriented x4. Pt follows commands well and answers questions appropriately        General Comments General comments (skin integrity, edema, etc.): pt on RA upon arrival, O2 sats not being monitored. Upon completion of ambulation to bathroom and back pt sats in  mid 80s with noted increased work of breathing. PT sats recover to 91% after ~3 minutes of seated rest.    Exercises     Assessment/Plan    PT Assessment Patient needs continued PT services  PT Problem List Decreased strength;Decreased activity tolerance;Decreased balance;Decreased mobility;Decreased cognition;Decreased knowledge of use of DME;Decreased safety awareness;Cardiopulmonary status limiting activity       PT Treatment Interventions DME instruction;Gait training;Stair training;Functional mobility training;Therapeutic activities;Therapeutic exercise;Balance training;Neuromuscular re-education;Cognitive remediation;Patient/family education    PT Goals (Current goals can be found in the Care Plan section)  Acute Rehab PT Goals Patient Stated Goal: to regain strength and become more independent in the home PT Goal Formulation: With patient Time For Goal Achievement: 10/20/22 Potential to Achieve Goals: Fair    Frequency Min 2X/week     Co-evaluation               AM-PAC PT "6 Clicks" Mobility  Outcome Measure Help needed turning from your back to your side while in a flat bed without using bedrails?: None Help needed moving from lying on your back to sitting on the side of a flat bed without using bedrails?: None Help needed moving to and from a bed to a chair (including a wheelchair)?: A Little Help needed standing up from a chair using your arms (e.g., wheelchair or bedside chair)?:  A Little Help needed to walk in hospital room?: A Little Help needed climbing 3-5 steps with a railing? : Total 6 Click Score: 18    End of Session   Activity Tolerance: Patient limited by fatigue Patient left: in bed;with call bell/phone within reach Nurse Communication: Mobility status PT Visit Diagnosis: Other abnormalities of gait and mobility (R26.89);Muscle weakness (generalized) (M62.81)    Time: 3235-5732 PT Time Calculation (min) (ACUTE ONLY): 31 min   Charges:   PT  Evaluation $PT Eval Low Complexity: Arlee, PT, DPT Acute Rehabilitation Office [phone]   Zenaida Niece 10/06/2022, 9:41 AM
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
## 2022-10-06 NOTE — Procedures (Signed)
Vascular and Interventional Radiology Procedure Note  Patient: Brandon [name] DOB: 1961-11-09 Medical Record Number: [id number] Note Date/Time: 10/06/22 11:40 AM   Performing Physician: Michaelle Birks, MD Assistant(s): None  Diagnosis: ESRD requiring Hemodialysis  Procedure: TUNNELED HEMODIALYSIS CATHETER PLACEMENT  Anesthesia: Conscious Sedation Complications: None Estimated Blood Loss: Minimal Specimens:  None  Findings:  Successful placement of right-sided, 23 cm (tip-to-cuff), tunneled hemodialysis catheter with the tip of the catheter in the proximal right atrium.  Plan: Catheter ready for use.  See detailed procedure note with images in PACS. The patient tolerated the procedure well without incident or complication and was returned to Floor Bed in stable condition.    Michaelle Birks, MD Vascular and Interventional Radiology Specialists [facility] Radiology   Pager. [place]

## 2022-10-06 NOTE — Progress Notes (Signed)
Pt arrived to unit in bed no c/os stable for HD. VSS UFG 1L

## 2022-10-06 NOTE — Progress Notes (Signed)
[facility] Progress Note   Assessment/ Plan:   **AHRF: requiring O2 to maintain sats - CXR with small effusions, exam suggests mild pum edema.  HFpEF +CKD 5 main issues.  Diurese and follow - may need to see if effusions are amenable to tap if persistently hypoxic.  - continue lasix 120 IV BID (was on lasix 80 po BID). 2g sodium, fluid restricted diet.  Weight daily, I/os.    **CKD 5: Advanced CKD at baseline 04/2022 GFR 10, now 5 - underlying issues HTN, h/o BL ablations.  Followed at [place] but last OV was 06/2022 - hasn't attended recommended f/u.  No emergency indications for dialysis but I recommended to do so in light of progressive decline in GFR over past 6 months from 11 to 5, volume issues and low grade uremic symptoms (dysgeusia, nausea).    - was initially declining dialysis after conversations with me and Dr Johnney Ou - do not expect any of his mild uremic symptoms or his volume to get much better until it's more definitively managed - palliative care c/s--> GOC have been clarified to do dialysis- greatly appreciate assistance - ir c/s for [facility], greatly appreciate assistance - HD #1 today 11/2 - will need VVS c/s too and vein mapping if he is agreeable   **HTN:  home meds have been resume, follow with diuresis   **h/o CVA 2014   **Anemia:  f/b ESA clinic outpt - hb acceptable.     Will follow, call with concerns.   Subjective:    For tunneled HD cath today.  Curled up under blankets this AM, doesn't come out but does talk to me.   Objective:   BP (!) 165/89 (BP Location: Left Arm)   Pulse 65   Temp (!) 97.4 F (36.3 C) (Oral)   Resp 12   Ht 6' (1.829 m)   Wt 121.9 kg   SpO2 99%   BMI 36.45 kg/m   Intake/Output Summary (Last 24 hours) at 10/06/2022 1252 Last data filed at 10/06/2022 0400 Gross per 24 hour  Intake 272 ml  Output 400 ml  Net -128 ml   Weight change:   Physical Exam: Gen:NAD, sitting in bed CVS:RRR Resp: bibasilar crackles Abd:  soft Ext: 3+ pitting edema bilaterally  Imaging: No results found.  Labs: BMET Recent Labs  Lab 10/02/22 0420 10/03/22 0601 10/04/22 0440 10/05/22 0402 10/06/22 0315  NA 143 141 143 143 142  K 5.1 4.7 4.5 4.6 4.7  CL 107 109 110 109 108  CO2 '23 22 23 23 24  '$ GLUCOSE 120* 120* 95 100* 104*  BUN 47* 50* 50* 50* 51*  CREATININE 11.00* 11.03* 11.30* 11.54* 11.77*  CALCIUM 9.0 8.4* 8.5* 8.3* 8.4*  PHOS  --  5.4* 5.9* 6.1* 6.1*   CBC Recent Labs  Lab 10/02/22 0420  WBC 5.9  NEUTROABS 4.1  HGB 9.7*  HCT 29.7*  MCV 88.9  PLT 215    Medications:     amLODipine  10 mg Oral Daily   atorvastatin  40 mg Oral Daily   carvedilol  25 mg Oral BID   Chlorhexidine Gluconate Cloth  6 each Topical Q0600   ezetimibe  10 mg Oral Daily   heparin  5,000 Units Subcutaneous Q8H   heparin sodium (porcine)       levETIRAcetam  500 mg Oral BID   sertraline  100 mg Oral Daily   sodium bicarbonate  1,300 mg Oral BID   sodium chloride flush  3  mL Intravenous Q12H    Madelon Lips MD 10/06/2022, 12:52 PM

## 2022-10-07 ENCOUNTER — Encounter (HOSPITAL_COMMUNITY): Payer: Self-pay

## 2022-10-07 ENCOUNTER — Inpatient Hospital Stay (HOSPITAL_COMMUNITY)
Admission: RE | Admit: 2022-10-07 | Discharge: 2022-10-07 | Disposition: A | Discharge status: Home / Self Care | Payer: Medicare Other | Source: Ambulatory Visit | Attending: Nephrology | Admitting: Nephrology

## 2022-10-07 DIAGNOSIS — F419 Anxiety disorder, unspecified: Secondary | ICD-10-CM | POA: Diagnosis not present

## 2022-10-07 DIAGNOSIS — E119 Type 2 diabetes mellitus without complications: Secondary | ICD-10-CM | POA: Diagnosis not present

## 2022-10-07 DIAGNOSIS — I5033 Acute on chronic diastolic (congestive) heart failure: Secondary | ICD-10-CM | POA: Diagnosis not present

## 2022-10-07 DIAGNOSIS — J9601 Acute respiratory failure with hypoxia: Secondary | ICD-10-CM | POA: Diagnosis not present

## 2022-10-07 LAB — RENAL FUNCTION PANEL
Albumin: 3.1 g/dL — ABNORMAL LOW (ref 3.5–5.0)
Anion gap: 13 (ref 5–15)
BUN: 37 mg/dL — ABNORMAL HIGH (ref 6–20)
CO2: 23 mmol/L (ref 22–32)
Calcium: 8 mg/dL — ABNORMAL LOW (ref 8.9–10.3)
Chloride: 105 mmol/L (ref 98–111)
Creatinine, Ser: 9.26 mg/dL — ABNORMAL HIGH (ref 0.61–1.24)
GFR, Estimated: 6 mL/min — ABNORMAL LOW (ref >60)
Glucose, Bld: 86 mg/dL (ref 70–99)
Phosphorus: 4.8 mg/dL — ABNORMAL HIGH (ref 2.5–4.6)
Potassium: 3.6 mmol/L (ref 3.5–5.1)
Sodium: 141 mmol/L (ref 135–145)

## 2022-10-07 MED ORDER — ONDANSETRON HCL 4 MG/2ML IJ SOLN
4.0000 mg | Freq: Four times a day (QID) | INTRAMUSCULAR | Status: DC | PRN
  Administered 2022-10-10: 4 mg via INTRAVENOUS
  Filled 2022-10-07 (×2): qty 2

## 2022-10-07 MED ORDER — DARBEPOETIN ALFA 100 MCG/0.5ML IJ SOSY
100.0000 ug | PREFILLED_SYRINGE | INTRAMUSCULAR | Status: DC
  Administered 2022-10-08 – 2022-10-15 (×2): 100 ug via SUBCUTANEOUS
  Filled 2022-10-07 (×3): qty 0.5

## 2022-10-07 NOTE — Progress Notes (Signed)
Requested to see pt for out-pt HD needs at d/c. Met with pt and pt's significant other (how she is listed on demo) at bedside. Introduced self and explained role. Pt resides in [place] and prefers a clinic close to home. Referral submitted to [facility] admissions for review. Pt's sign other states she will assist with transportation to HD at d/c. Update provided to [facility] staff. Will assist as needed.   Melven Sartorius Renal Navigator [phone]

## 2022-10-07 NOTE — Progress Notes (Signed)
When offering patient Zofran for episode of vomit/nausea, pt declined PRN dose and stated "he don't feel sick anymore". Pt also endorses that pain had decreased from PRN pain medication.

## 2022-10-07 NOTE — Progress Notes (Signed)
                                                     Palliative Care Progress Note, Assessment & Plan   Patient Name: Brandon [name]       Date: 10/07/2022 DOB: 1961-05-24  Age: 61 y.o. MRN#: [id number] Attending Physician: Terrilee Croak, MD Primary Care Physician: Ronnell Freshwater, NP Admit Date: 10/02/2022  Reason for Consultation/Follow-up: Establishing goals of care  Summary of counseling/coordination of care: After reviewing the patient's chart and assessing the patient at bedside, we discussed pain management.  Patient complains of 10 out of 10 pain in his right arm due to HD catheter placement as well as generalized pain.  Patient endorses vomiting a.m. medications which included 1 pain pill.  He also shares he is remained nauseous with no appetite over the last 2 days.  He reports he has not eaten any of his meals and only had sips of water when not n.p.o. for procedures.  Discussed importance of nutrition coupled with pain management and aggressive nausea control.  I counseled with patient's dayshift RN.  Recommended as needed Zofran, waiting 20 to 30 minutes, having patient eat crackers/bland foods, and attempting administration of 1 pain pill.  Reviewed above plan to better control nausea and pain with patient.  Patient and significant other [name] at bedside are in agreement.  PMT will continue to follow the patient throughout his hospitalization.  Physical Exam Constitutional:      General: He is not in acute distress.    Appearance: He is well-developed. He is obese. He is not ill-appearing.  HENT:     Head: Normocephalic.  Cardiovascular:     Rate and Rhythm: Normal rate.  Pulmonary:     Effort: Pulmonary effort is normal.  Musculoskeletal:        General: Normal range of motion.     Comments: MAETC, generalized weakness  Skin:     General: Skin is warm and dry.  Neurological:     Mental Status: He is alert and oriented to person, place, and time.  Psychiatric:        Mood and Affect: Mood normal. Mood is not anxious.        Behavior: Behavior normal. Behavior is not agitated.             Palliative Assessment/Data: 50%    Total Time 35 minutes  Greater than 50%  of this time was spent counseling and coordinating care related to the above assessment and plan.  Thank you for allowing the Palliative Medicine Team to assist in the care of this patient.  Avery Ilsa Iha, FNP-BC Palliative Medicine Team Team Phone # [phone]
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
## 2022-10-07 NOTE — Progress Notes (Signed)
[facility] Progress Note   Assessment/ Plan:   **AHRF: requiring O2 to maintain sats - CXR with small effusions, exam suggests mild pum edema.  HFpEF +CKD 5 main issues.  Diurese and follow - may need to see if effusions are amenable to tap if persistently hypoxic.  - continue lasix 120 IV BID (was on lasix 80 po BID). 2g sodium, fluid restricted diet.  Weight daily, I/os.    **CKD 5: Advanced CKD at baseline 04/2022 GFR 10, now 5 - underlying issues HTN, h/o BL ablations.  Followed at [place] but last OV was 06/2022 - hasn't attended recommended f/u.  No emergency indications for dialysis but I recommended to do so in light of progressive decline in GFR over past 6 months from 11 to 5, volume issues and low grade uremic symptoms (dysgeusia, nausea).    - was initially declining dialysis after conversations with me and Dr Johnney Ou - do not expect any of his mild uremic symptoms or his volume to get much better until it's more definitively managed - palliative care c/s--> GOC have been clarified to do dialysis- greatly appreciate assistance - ir c/s for [facility], greatly appreciate assistance - HD #1 11/2, HD #2 10/08/22 - will need VVS c/s too and vein mapping if he is agreeable   **HTN:  home meds have been resume, follow with diuresis   **h/o CVA 2014   **Anemia:  f/b ESA clinic outpt - hb acceptable.     Will follow, call with concerns.   Subjective:    S/p HD yesterday, did well.  For HD #2 tomorrow.    Objective:   BP (!) 174/79 (BP Location: Right Leg)   Pulse 95   Temp 98.1 F (36.7 C) (Oral)   Resp 17   Ht 6' (1.829 m)   Wt 116.9 kg   SpO2 100%   BMI 34.95 kg/m   Intake/Output Summary (Last 24 hours) at 10/07/2022 1501 Last data filed at 10/07/2022 0549 Gross per 24 hour  Intake 242 ml  Output 1200 ml  Net -958 ml   Weight change:   Physical Exam: Gen:NAD, sitting in bed CVS:RRR Resp: bibasilar crackles Abd: soft Ext: 3+ pitting edema  bilaterally  Imaging: IR Fluoro Guide CV Line Right  Result Date: 10/06/2022 INDICATION: ESRD requiring HD initiation EXAM: TUNNELED CENTRAL VENOUS HEMODIALYSIS CATHETER PLACEMENT WITH ULTRASOUND AND FLUOROSCOPIC GUIDANCE MEDICATIONS: Ancef 2 gm IV . The antibiotic was given in an appropriate time interval prior to skin puncture. ANESTHESIA/SEDATION: Moderate (conscious) sedation was employed during this procedure. A total of Versed 1 mg and Fentanyl 25 mcg was administered intravenously. Moderate Sedation Time: 15 minutes. The patient's level of consciousness and vital signs were monitored continuously by radiology nursing throughout the procedure under my direct supervision. FLUOROSCOPY TIME:  Fluoroscopic dose; 11 mGy COMPLICATIONS: None immediate. PROCEDURE: Informed written consent was obtained from the patient after a discussion of the risks, benefits, and alternatives to treatment. Questions regarding the procedure were encouraged and answered. The RIGHT neck and chest were prepped with chlorhexidine in a sterile fashion, and a sterile drape was applied covering the operative field. Maximum barrier sterile technique with sterile gowns and gloves were used for the procedure. A timeout was performed prior to the initiation of the procedure. After creating a small venotomy incision, a micropuncture kit was utilized to access the internal jugular vein. Real-time ultrasound guidance was utilized for vascular access including the acquisition of a permanent ultrasound image documenting patency of the  accessed vessel. The microwire was utilized to measure appropriate catheter length. A stiff Glidewire was advanced to the level of the IVC and the micropuncture sheath was exchanged for a peel-away sheath. A palindrome tunneled hemodialysis catheter measuring 23 cm from tip to cuff was tunneled in a retrograde fashion from the anterior chest wall to the venotomy incision. The catheter was then placed through the  peel-away sheath with tips ultimately positioned within the superior aspect of the right atrium. Final catheter positioning was confirmed and documented with a spot radiographic image. The catheter aspirates and flushes normally. The catheter was flushed with appropriate volume heparin dwells. The catheter exit site was secured with a 2-0 Ethilon retention suture. The venotomy incision was closed with Dermabond. Dressings were applied. The patient tolerated the procedure well without immediate post procedural complication. IMPRESSION: Successful placement of 23 cm tip to cuff tunneled hemodialysis catheter via the RIGHT internal jugular vein, as above The tip of the catheter is positioned within the proximal RIGHT atrium. The catheter is ready for immediate use. Michaelle Birks, MD Vascular and Interventional Radiology Specialists [facility] Radiology Electronically Signed   By: Michaelle Birks M.D.   On: 10/06/2022 12:57   IR US Guide Vasc Access Right  Result Date: 10/06/2022 INDICATION: ESRD requiring HD initiation EXAM: TUNNELED CENTRAL VENOUS HEMODIALYSIS CATHETER PLACEMENT WITH ULTRASOUND AND FLUOROSCOPIC GUIDANCE MEDICATIONS: Ancef 2 gm IV . The antibiotic was given in an appropriate time interval prior to skin puncture. ANESTHESIA/SEDATION: Moderate (conscious) sedation was employed during this procedure. A total of Versed 1 mg and Fentanyl 25 mcg was administered intravenously. Moderate Sedation Time: 15 minutes. The patient's level of consciousness and vital signs were monitored continuously by radiology nursing throughout the procedure under my direct supervision. FLUOROSCOPY TIME:  Fluoroscopic dose; 11 mGy COMPLICATIONS: None immediate. PROCEDURE: Informed written consent was obtained from the patient after a discussion of the risks, benefits, and alternatives to treatment. Questions regarding the procedure were encouraged and answered. The RIGHT neck and chest were prepped with chlorhexidine in a sterile  fashion, and a sterile drape was applied covering the operative field. Maximum barrier sterile technique with sterile gowns and gloves were used for the procedure. A timeout was performed prior to the initiation of the procedure. After creating a small venotomy incision, a micropuncture kit was utilized to access the internal jugular vein. Real-time ultrasound guidance was utilized for vascular access including the acquisition of a permanent ultrasound image documenting patency of the accessed vessel. The microwire was utilized to measure appropriate catheter length. A stiff Glidewire was advanced to the level of the IVC and the micropuncture sheath was exchanged for a peel-away sheath. A palindrome tunneled hemodialysis catheter measuring 23 cm from tip to cuff was tunneled in a retrograde fashion from the anterior chest wall to the venotomy incision. The catheter was then placed through the peel-away sheath with tips ultimately positioned within the superior aspect of the right atrium. Final catheter positioning was confirmed and documented with a spot radiographic image. The catheter aspirates and flushes normally. The catheter was flushed with appropriate volume heparin dwells. The catheter exit site was secured with a 2-0 Ethilon retention suture. The venotomy incision was closed with Dermabond. Dressings were applied. The patient tolerated the procedure well without immediate post procedural complication. IMPRESSION: Successful placement of 23 cm tip to cuff tunneled hemodialysis catheter via the RIGHT internal jugular vein, as above The tip of the catheter is positioned within the proximal RIGHT atrium. The catheter is ready  for immediate use. Michaelle Birks, MD Vascular and Interventional Radiology Specialists [facility] Radiology Electronically Signed   By: Michaelle Birks M.D.   On: 10/06/2022 12:57    Labs: BMET Recent Labs  Lab 10/02/22 0420 10/03/22 0601 10/04/22 0440 10/05/22 0402 10/06/22 0315  10/07/22 0233  NA 143 141 143 143 142 141  K 5.1 4.7 4.5 4.6 4.7 3.6  CL 107 109 110 109 108 105  CO2 _0 GLUCOSE 120* 120* 95 100* 104* 86  BUN 47* 50* 50* 50* 51* 37*  CREATININE 11.00* 11.03* 11.30* 11.54* 11.77* 9.26*  CALCIUM 9.0 8.4* 8.5* 8.3* 8.4* 8.0*  PHOS  --  5.4* 5.9* 6.1* 6.1* 4.8*   CBC Recent Labs  Lab 10/02/22 0420 10/06/22 1634  WBC 5.9 3.6*  NEUTROABS 4.1  --   HGB 9.7* 7.2*  HCT 29.7* 22.0*  MCV 88.9 87.3  PLT 215 157    Medications:     amLODipine  10 mg Oral Daily   atorvastatin  40 mg Oral Daily   carvedilol  25 mg Oral BID   Chlorhexidine Gluconate Cloth  6 each Topical Q0600   ezetimibe  10 mg Oral Daily   heparin  5,000 Units Subcutaneous Q8H   levETIRAcetam  500 mg Oral BID   sertraline  100 mg Oral Daily   sodium bicarbonate  1,300 mg Oral BID   sodium chloride flush  3 mL Intravenous Q12H    Madelon Lips MD 10/07/2022, 3:01 PM

## 2022-10-07 NOTE — Plan of Care (Signed)

## 2022-10-08 DIAGNOSIS — J9601 Acute respiratory failure with hypoxia: Secondary | ICD-10-CM | POA: Diagnosis not present

## 2022-10-08 LAB — CBC WITH DIFFERENTIAL/PLATELET
Abs Immature Granulocytes: 0.01 10*3/uL (ref 0.00–0.07)
Basophils Absolute: 0 10*3/uL (ref 0.0–0.1)
Basophils Relative: 1 %
Eosinophils Absolute: 0.2 10*3/uL (ref 0.0–0.5)
Eosinophils Relative: 4 %
HCT: 22.7 % — ABNORMAL LOW (ref 39.0–52.0)
Hemoglobin: 7.5 g/dL — ABNORMAL LOW (ref 13.0–17.0)
Immature Granulocytes: 0 %
Lymphocytes Relative: 35 %
Lymphs Abs: 1.4 10*3/uL (ref 0.7–4.0)
MCH: 28.7 pg (ref 26.0–34.0)
MCHC: 33 g/dL (ref 30.0–36.0)
MCV: 87 fL (ref 80.0–100.0)
Monocytes Absolute: 0.5 10*3/uL (ref 0.1–1.0)
Monocytes Relative: 12 %
Neutro Abs: 1.9 10*3/uL (ref 1.7–7.7)
Neutrophils Relative %: 48 %
Platelets: 171 10*3/uL (ref 150–400)
RBC: 2.61 MIL/uL — ABNORMAL LOW (ref 4.22–5.81)
RDW: 13.5 % (ref 11.5–15.5)
WBC: 3.9 10*3/uL — ABNORMAL LOW (ref 4.0–10.5)
nRBC: 0 % (ref 0.0–0.2)

## 2022-10-08 LAB — RETICULOCYTES
Immature Retic Fract: 8.6 % (ref 2.3–15.9)
RBC.: 2.65 MIL/uL — ABNORMAL LOW (ref 4.22–5.81)
Retic Count, Absolute: 32.6 10*3/uL (ref 19.0–186.0)
Retic Ct Pct: 1.2 % (ref 0.4–3.1)

## 2022-10-08 LAB — FOLATE: Folate: 12.2 ng/mL (ref >5.9)

## 2022-10-08 LAB — RENAL FUNCTION PANEL
Albumin: 2.7 g/dL — ABNORMAL LOW (ref 3.5–5.0)
Anion gap: 7 (ref 5–15)
BUN: 41 mg/dL — ABNORMAL HIGH (ref 6–20)
CO2: 24 mmol/L (ref 22–32)
Calcium: 7.7 mg/dL — ABNORMAL LOW (ref 8.9–10.3)
Chloride: 109 mmol/L (ref 98–111)
Creatinine, Ser: 9.93 mg/dL — ABNORMAL HIGH (ref 0.61–1.24)
GFR, Estimated: 6 mL/min — ABNORMAL LOW (ref >60)
Glucose, Bld: 78 mg/dL (ref 70–99)
Phosphorus: 5 mg/dL — ABNORMAL HIGH (ref 2.5–4.6)
Potassium: 3.6 mmol/L (ref 3.5–5.1)
Sodium: 140 mmol/L (ref 135–145)

## 2022-10-08 LAB — IRON AND TIBC
Iron: 63 ug/dL (ref 45–182)
Saturation Ratios: 49 % — ABNORMAL HIGH (ref 17.9–39.5)
TIBC: 129 ug/dL — ABNORMAL LOW (ref 250–450)
UIBC: 66 ug/dL

## 2022-10-08 LAB — FERRITIN: Ferritin: 350 ng/mL — ABNORMAL HIGH (ref 24–336)

## 2022-10-08 LAB — VITAMIN B12: Vitamin B-12: 773 pg/mL (ref 180–914)

## 2022-10-08 MED ORDER — HEPARIN SODIUM (PORCINE) 1000 UNIT/ML IJ SOLN
INTRAMUSCULAR | Status: AC
  Filled 2022-10-08: qty 4

## 2022-10-08 MED ORDER — PANTOPRAZOLE SODIUM 40 MG PO TBEC
40.0000 mg | DELAYED_RELEASE_TABLET | Freq: Every day | ORAL | Status: DC
  Administered 2022-10-08 – 2022-10-16 (×9): 40 mg via ORAL
  Filled 2022-10-08 (×9): qty 1

## 2022-10-08 MED ORDER — FERROUS SULFATE 325 (65 FE) MG PO TABS
325.0000 mg | ORAL_TABLET | Freq: Two times a day (BID) | ORAL | Status: DC
  Administered 2022-10-08 – 2022-10-16 (×15): 325 mg via ORAL
  Filled 2022-10-08 (×15): qty 1

## 2022-10-08 MED ORDER — FOLIC ACID 1 MG PO TABS
1.0000 mg | ORAL_TABLET | Freq: Every day | ORAL | Status: DC
  Administered 2022-10-08 – 2022-10-16 (×9): 1 mg via ORAL
  Filled 2022-10-08 (×9): qty 1

## 2022-10-08 MED ORDER — ISOSORBIDE MONONITRATE ER 30 MG PO TB24
30.0000 mg | ORAL_TABLET | Freq: Every day | ORAL | Status: DC
  Administered 2022-10-08 – 2022-10-16 (×9): 30 mg via ORAL
  Filled 2022-10-08 (×9): qty 1

## 2022-10-08 NOTE — Progress Notes (Signed)
Received patient in bed to unit.  Alert and oriented.  Informed consent signed and in chart.   Treatment initiated: 1151 Treatment completed: 4709  Patient tolerated well.  Transported back to the room  Alert, without acute distress.  Hand-off given to patient's nurse.   Access used: Catheter Access issues: none  Total UF removed: 2000 Medication(s) given: none Post HD VS: 98, 168/88(109), HR-72, RR-21, SP02-92 Post HD weight: 122.9kg   [facility]

## 2022-10-08 NOTE — Plan of Care (Signed)
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
## 2022-10-08 NOTE — Progress Notes (Signed)
[facility] Progress Note   Assessment/ Plan:   **AHRF: requiring O2 to maintain sats - CXR with small effusions, exam suggests mild pum edema.  HFpEF +CKD 5 main issues.  Diurese and follow - may need to see if effusions are amenable to tap if persistently hypoxic.  - continue lasix 120 IV BID (was on lasix 80 po BID). 2g sodium, fluid restricted diet.  Weight daily, I/os.    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  Followed at [place] but last OV was 06/2022 - hasn't attended recommended f/u.  No emergency indications for dialysis but I recommended to do so in light of progressive decline in GFR over past 6 months from 11 to 5, volume issues and low grade uremic symptoms (dysgeusia, nausea).    - was initially declining dialysis after conversations with me and Dr Johnney Ou - do not expect any of his mild uremic symptoms or his volume to get much better until it's more definitively managed - palliative care c/s--> GOC have been clarified to do dialysis- greatly appreciate assistance - ir c/s for [facility], greatly appreciate assistance - HD #1 11/2, HD #2 10/08/22 - will need VVS c/s too and vein mapping if he is agreeable   **HTN:  home meds have been resume, follow with diuresis   **h/o CVA 2014   **Anemia:  f/b ESA clinic outpt - hb acceptable.     Will follow, call with concerns.   Subjective:    HD #2 today.  Is nauseated.     Objective:   BP (!) 182/80 (BP Location: Left Arm)   Pulse 64   Temp 98.1 F (36.7 C) (Oral)   Resp 16   Ht 6' (1.829 m)   Wt 116.9 kg   SpO2 91%   BMI 34.95 kg/m   Intake/Output Summary (Last 24 hours) at 10/08/2022 1039 Last data filed at 10/07/2022 1500 Gross per 24 hour  Intake --  Output 270 ml  Net -270 ml   Weight change:   Physical Exam: Gen:NAD, lying in bed CVS:RRR Resp: bibasilar crackles Abd: soft Ext: 3+ pitting edema bilaterally  Imaging: IR Fluoro  Guide CV Line Right  Result Date: 10/06/2022 INDICATION: ESRD requiring HD initiation EXAM: TUNNELED CENTRAL VENOUS HEMODIALYSIS CATHETER PLACEMENT WITH ULTRASOUND AND FLUOROSCOPIC GUIDANCE MEDICATIONS: Ancef 2 gm IV . The antibiotic was given in an appropriate time interval prior to skin puncture. ANESTHESIA/SEDATION: Moderate (conscious) sedation was employed during this procedure. A total of Versed 1 mg and Fentanyl 25 mcg was administered intravenously. Moderate Sedation Time: 15 minutes. The patient's level of consciousness and vital signs were monitored continuously by radiology nursing throughout the procedure under my direct supervision. FLUOROSCOPY TIME:  Fluoroscopic dose; 11 mGy COMPLICATIONS: None immediate. PROCEDURE: Informed written consent was obtained from the patient after a discussion of the risks, benefits, and alternatives to treatment. Questions regarding the procedure were encouraged and answered. The RIGHT neck and chest were prepped with chlorhexidine in a sterile fashion, and a sterile drape was applied covering the operative field. Maximum barrier sterile technique with sterile gowns and gloves were used for the procedure. A timeout was performed prior to the initiation of the procedure. After creating a small venotomy incision, a micropuncture kit was utilized to access the internal jugular vein. Real-time ultrasound guidance was utilized for vascular access including the acquisition of a permanent ultrasound image documenting patency of the accessed vessel.  The microwire was utilized to measure appropriate catheter length. A stiff Glidewire was advanced to the level of the IVC and the micropuncture sheath was exchanged for a peel-away sheath. A palindrome tunneled hemodialysis catheter measuring 23 cm from tip to cuff was tunneled in a retrograde fashion from the anterior chest wall to the venotomy incision. The catheter was then placed through the peel-away sheath with tips ultimately  positioned within the superior aspect of the right atrium. Final catheter positioning was confirmed and documented with a spot radiographic image. The catheter aspirates and flushes normally. The catheter was flushed with appropriate volume heparin dwells. The catheter exit site was secured with a 2-0 Ethilon retention suture. The venotomy incision was closed with Dermabond. Dressings were applied. The patient tolerated the procedure well without immediate post procedural complication. IMPRESSION: Successful placement of 23 cm tip to cuff tunneled hemodialysis catheter via the RIGHT internal jugular vein, as above The tip of the catheter is positioned within the proximal RIGHT atrium. The catheter is ready for immediate use. Michaelle Birks, MD [facility] Radiology Electronically Signed   By: Michaelle Birks M.D.   On: 10/06/2022 12:57   IR US Guide Vasc Access Right  Result Date: 10/06/2022 INDICATION: ESRD requiring HD initiation EXAM: TUNNELED CENTRAL VENOUS HEMODIALYSIS CATHETER PLACEMENT WITH ULTRASOUND AND FLUOROSCOPIC GUIDANCE MEDICATIONS: Ancef 2 gm IV . The antibiotic was given in an appropriate time interval prior to skin puncture. ANESTHESIA/SEDATION: Moderate (conscious) sedation was employed during this procedure. A total of Versed 1 mg and Fentanyl 25 mcg was administered intravenously. Moderate Sedation Time: 15 minutes. The patient's level of consciousness and vital signs were monitored continuously by radiology nursing throughout the procedure under my direct supervision. FLUOROSCOPY TIME:  Fluoroscopic dose; 11 mGy COMPLICATIONS: None immediate. PROCEDURE: Informed written consent was obtained from the patient after a discussion of the risks, benefits, and alternatives to treatment. Questions regarding the procedure were encouraged and answered. The RIGHT neck and chest were prepped with chlorhexidine in a sterile fashion, and a sterile drape was  applied covering the operative field. Maximum barrier sterile technique with sterile gowns and gloves were used for the procedure. A timeout was performed prior to the initiation of the procedure. After creating a small venotomy incision, a micropuncture kit was utilized to access the internal jugular vein. Real-time ultrasound guidance was utilized for vascular access including the acquisition of a permanent ultrasound image documenting patency of the accessed vessel. The microwire was utilized to measure appropriate catheter length. A stiff Glidewire was advanced to the level of the IVC and the micropuncture sheath was exchanged for a peel-away sheath. A palindrome tunneled hemodialysis catheter measuring 23 cm from tip to cuff was tunneled in a retrograde fashion from the anterior chest wall to the venotomy incision. The catheter was then placed through the peel-away sheath with tips ultimately positioned within the superior aspect of the right atrium. Final catheter positioning was confirmed and documented with a spot radiographic image. The catheter aspirates and flushes normally. The catheter was flushed with appropriate volume heparin dwells. The catheter exit site was secured with a 2-0 Ethilon retention suture. The venotomy incision was closed with Dermabond. Dressings were applied. The patient tolerated the procedure well without immediate post procedural complication. IMPRESSION: Successful placement of 23 cm tip to cuff tunneled hemodialysis catheter via the RIGHT internal jugular vein, as above The tip of the catheter is positioned within the proximal RIGHT atrium. The catheter is ready for immediate  use. Michaelle Birks, MD [facility] Radiology Electronically Signed   By: Michaelle Birks M.D.   On: 10/06/2022 12:57    Labs: BMET Recent Labs  Lab 10/02/22 0420 10/03/22 0601 10/04/22 0440 10/05/22 0402 10/06/22 0315 10/07/22 0233 10/08/22 0347  NA  143 141 143 143 142 141 140  K 5.1 4.7 4.5 4.6 4.7 3.6 3.6  CL 107 109 110 109 108 105 109  CO2 _0 GLUCOSE 120* 120* 95 100* 104* 86 78  BUN 47* 50* 50* 50* 51* 37* 41*  CREATININE 11.00* 11.03* 11.30* 11.54* 11.77* 9.26* 9.93*  CALCIUM 9.0 8.4* 8.5* 8.3* 8.4* 8.0* 7.7*  PHOS  --  5.4* 5.9* 6.1* 6.1* 4.8* 5.0*   CBC Recent Labs  Lab 10/02/22 0420 10/06/22 1634 10/08/22 0347  WBC 5.9 3.6* 3.9*  NEUTROABS 4.1  --  1.9  HGB 9.7* 7.2* 7.5*  HCT 29.7* 22.0* 22.7*  MCV 88.9 87.3 87.0  PLT 215 157 171    Medications:     amLODipine  10 mg Oral Daily   atorvastatin  40 mg Oral Daily   carvedilol  25 mg Oral BID   Chlorhexidine Gluconate Cloth  6 each Topical Q0600   darbepoetin (ARANESP) injection - DIALYSIS  100 mcg Subcutaneous Q Sat-1800   ezetimibe  10 mg Oral Daily   heparin  5,000 Units Subcutaneous Q8H   isosorbide mononitrate  30 mg Oral Daily   levETIRAcetam  500 mg Oral BID   sertraline  100 mg Oral Daily   sodium bicarbonate  1,300 mg Oral BID   sodium chloride flush  3 mL Intravenous Q12H    Madelon Lips MD 10/08/2022, 10:39 AM

## 2022-10-09 DIAGNOSIS — J9601 Acute respiratory failure with hypoxia: Secondary | ICD-10-CM | POA: Diagnosis not present

## 2022-10-09 LAB — RENAL FUNCTION PANEL
Albumin: 2.7 g/dL — ABNORMAL LOW (ref 3.5–5.0)
Anion gap: 13 (ref 5–15)
BUN: 24 mg/dL — ABNORMAL HIGH (ref 6–20)
CO2: 23 mmol/L (ref 22–32)
Calcium: 7.8 mg/dL — ABNORMAL LOW (ref 8.9–10.3)
Chloride: 100 mmol/L (ref 98–111)
Creatinine, Ser: 7.29 mg/dL — ABNORMAL HIGH (ref 0.61–1.24)
GFR, Estimated: 8 mL/min — ABNORMAL LOW (ref >60)
Glucose, Bld: 60 mg/dL — ABNORMAL LOW (ref 70–99)
Phosphorus: 4.4 mg/dL (ref 2.5–4.6)
Potassium: 3.9 mmol/L (ref 3.5–5.1)
Sodium: 136 mmol/L (ref 135–145)

## 2022-10-09 NOTE — Progress Notes (Signed)
Tele informed about the drop of HR to 30s once.Informed MD.HR is stablized to70S now

## 2022-10-09 NOTE — Progress Notes (Signed)
[facility] Progress Note   Assessment/ Plan:   **AHRF: requiring O2 to maintain sats - CXR with small effusions, exam suggests mild pum edema.  HFpEF +CKD 5 main issues.  Diurese and follow - may need to see if effusions are amenable to tap if persistently hypoxic.  - continue lasix 120 IV BID (was on lasix 80 po BID). 2g sodium, fluid restricted diet.  Weight daily, I/os.    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  Followed at [place] but last OV was 06/2022 - hasn't attended recommended f/u.  No emergency indications for dialysis but I recommended to do so in light of progressive decline in GFR over past 6 months from 11 to 5, volume issues and low grade uremic symptoms (dysgeusia, nausea).    - was initially declining dialysis after conversations with me and Dr Johnney Ou - do not expect any of his mild uremic symptoms or his volume to get much better until it's more definitively managed - palliative care c/s--> GOC have been clarified to do dialysis- greatly appreciate assistance - ir c/s for [facility], greatly appreciate assistance - HD #1 11/2, HD #2 10/08/22, HD #3 10/10/22 - will need VVS c/s too and vein mapping if he is agreeable   **HTN:  home meds have been resume, follow with diuresis   **h/o CVA 2014   **Anemia:  f/b ESA clinic outpt - hb acceptable.     Will follow, call with concerns.   Subjective:    Sitting in the chair today.  No complaints   Objective:   BP (!) 172/80 (BP Location: Left Arm)   Pulse 75   Temp 98.6 F (37 C) (Oral)   Resp 20   Ht 6' (1.829 m)   Wt 112.9 kg   SpO2 95%   BMI 33.76 kg/m   Intake/Output Summary (Last 24 hours) at 10/09/2022 1032 Last data filed at 10/08/2022 2200 Gross per 24 hour  Intake --  Output 2750 ml  Net -2750 ml   Weight change:   Physical Exam: Gen:NAD, lying in bed CVS:RRR Resp: bibasilar crackles Abd: soft Ext: 2+ pitting edema  bilaterally, improved  Imaging: No results found.  Labs: BMET Recent Labs  Lab 10/03/22 0601 10/04/22 0440 10/05/22 0402 10/06/22 0315 10/07/22 0233 10/08/22 0347 10/09/22 0407  NA 141 143 143 142 141 140 136  K 4.7 4.5 4.6 4.7 3.6 3.6 3.9  CL 109 110 109 108 105 109 100  CO2 '22 23 23 24 23 24 23  '$ GLUCOSE 120* 95 100* 104* 86 78 60*  BUN 50* 50* 50* 51* 37* 41* 24*  CREATININE 11.03* 11.30* 11.54* 11.77* 9.26* 9.93* 7.29*  CALCIUM 8.4* 8.5* 8.3* 8.4* 8.0* 7.7* 7.8*  PHOS 5.4* 5.9* 6.1* 6.1* 4.8* 5.0* 4.4   CBC Recent Labs  Lab 10/06/22 1634 10/08/22 0347  WBC 3.6* 3.9*  NEUTROABS  --  1.9  HGB 7.2* 7.5*  HCT 22.0* 22.7*  MCV 87.3 87.0  PLT 157 171    Medications:     amLODipine  10 mg Oral Daily   atorvastatin  40 mg Oral Daily   carvedilol  25 mg Oral BID   Chlorhexidine Gluconate Cloth  6 each Topical Q0600   darbepoetin (ARANESP) injection - DIALYSIS  100 mcg Subcutaneous Q Sat-1800   ezetimibe  10 mg Oral Daily   ferrous sulfate  325 mg Oral BID WC  folic acid  1 mg Oral Daily   heparin  5,000 Units Subcutaneous Q8H   isosorbide mononitrate  30 mg Oral Daily   levETIRAcetam  500 mg Oral BID   pantoprazole  40 mg Oral Daily   sertraline  100 mg Oral Daily   sodium bicarbonate  1,300 mg Oral BID   sodium chloride flush  3 mL Intravenous Q12H    Madelon Lips MD 10/09/2022, 10:32 AM

## 2022-10-09 NOTE — Plan of Care (Signed)
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
## 2022-10-10 ENCOUNTER — Encounter (HOSPITAL_COMMUNITY): Payer: Medicare Other

## 2022-10-10 DIAGNOSIS — J9601 Acute respiratory failure with hypoxia: Secondary | ICD-10-CM | POA: Diagnosis not present

## 2022-10-10 DIAGNOSIS — N179 Acute kidney failure, unspecified: Secondary | ICD-10-CM | POA: Diagnosis not present

## 2022-10-10 DIAGNOSIS — R0602 Shortness of breath: Secondary | ICD-10-CM | POA: Diagnosis not present

## 2022-10-10 DIAGNOSIS — I509 Heart failure, unspecified: Secondary | ICD-10-CM | POA: Diagnosis not present

## 2022-10-10 LAB — RENAL FUNCTION PANEL
Albumin: 2.7 g/dL — ABNORMAL LOW (ref 3.5–5.0)
Anion gap: 14 (ref 5–15)
BUN: 26 mg/dL — ABNORMAL HIGH (ref 6–20)
CO2: 24 mmol/L (ref 22–32)
Calcium: 8.1 mg/dL — ABNORMAL LOW (ref 8.9–10.3)
Chloride: 103 mmol/L (ref 98–111)
Creatinine, Ser: 8.1 mg/dL — ABNORMAL HIGH (ref 0.61–1.24)
GFR, Estimated: 7 mL/min — ABNORMAL LOW (ref >60)
Glucose, Bld: 71 mg/dL (ref 70–99)
Phosphorus: 4.6 mg/dL (ref 2.5–4.6)
Potassium: 3.5 mmol/L (ref 3.5–5.1)
Sodium: 141 mmol/L (ref 135–145)

## 2022-10-10 MED ORDER — HEPARIN SODIUM (PORCINE) 1000 UNIT/ML IJ SOLN
INTRAMUSCULAR | Status: AC
  Administered 2022-10-10: 1000 [IU]
  Filled 2022-10-10: qty 4

## 2022-10-10 NOTE — Progress Notes (Signed)
New Dialysis Start   Patient identified as new dialysis start. Kidney Education packet assembled and given. Discussed the following items with patient:    Current medications and possible changes once started:  Discussed that patient's medications may change over time.  Ex; hypertension medications and diabetes medication.  Nephrologists will adjust as needed.  Fluid restrictions reviewed:  32 oz daily goal:  All liquids count; soups, ice, jello   Phosphorus and potassium: Handout given showing high potassium and phosphorus foods.  Alternative food and drink options given.  Family support:  Spoke with patient's significant other, Lisa  Outpatient Clinic Resources:  Discussed roles of Outpatient clinic  staff and advised to make a list of needs, if any, to talk with outpatient staff if needed  Care plan schedule: Informed patient and family member of Care Plans in outpatient setting and to participate in the care plan.  An invitation would be given from outpatient clinic.   Dialysis Access Options:  Reviewed access options with patients.  If dialysis catheter present, educated that patient could not take showers.  Catheter dressing changes were to be done by outpatient clinic staff only  Home therapy options:  Educated patient about home therapy options:  PD vs home hemo.     Patient verbalized understanding. Will continue to round on patient during admission.    Lilia Argue, RN

## 2022-10-10 NOTE — Progress Notes (Signed)
Received patient in bed to unit.  Alert and oriented.  Informed consent signed and in chart.   Treatment initiated: 0814 Treatment completed: 1628  Patient tolerated well.  Transported back to the room  Alert, without acute distress.  Hand-off given to patient's nurse.   Access used: Catheter Access issues: none  Total UF removed: 2500 Medication(s) given: none Post HD VS: 97.7, 190/95(124), HR-63, RR-18, Sp02-94 Post HD weight: 107.9kg   [facility]

## 2022-10-10 NOTE — Progress Notes (Addendum)
Palliative Care Progress Note, Assessment & Plan   Patient Name: Brandon [name]       Date: 10/10/2022 DOB: 1961/08/08  Age: 61 y.o. MRN#: [id number] Attending Physician: Brandon Lose, MD Primary Care Physician: Brandon Freshwater, NP Admit Date: 10/02/2022  Reason for Consultation/Follow-up: Establishing goals of care  Subjective: Patient is sitting at edge of bed with lights turned down and blinds closed.  He acknowledges my presence and is able to make his wishes known.  During our visit, his significant other Brandon [name] came to his room.  Summary of counseling/coordination of care: After reviewing the patient's chart and assessing the patient at bedside, I discussed plan of care with patient and Brandon [name].  As far as nutritional status, patient has not eaten in the last 3 to 4 days.  He shares he has no difficulty swallowing. He shares Brandon [name] brought him some soup that he normally enjoys eating but took 2 bites of it and was quickly done. He says he has not had an appetite for a while (PTA) but that it has significantly decreased during this hospitalization. DIscussed important of food as fuel and not always for fun/pleasure. He says he knows there is nothing physically wrong with him but that he just doesn't want to eat.   Therapeutic silence and active listening provided for patient to share his thoughts and emotions regarding current medical situation.  Emotional support provided.  I asked patient how he was handling everything he is experiencing mentally and emotionally. He and Brandon [name] discussed how patient has PTSD, bipolar disorder, schizophrenia, autism, and depression in addition to his TBI. Pt recalls spending time in an inpatient psych unit in [place] and how that helped him during stressful times. He had  intentions to establish with a psychologist PTA.   Brandon [name] is concerned that the sertraline is not adequately addressing patient's mental health issues.    Patient states he is depressed. He shares he does not want to be a burden on his significant other Brandon [name].  He shares he is not sure he wants to continue with hemodialysis but would like some time to think about it. He denies having current thoughts of SI/HI.  As far symptom management, patient states he is constantly living in pain.  He says that when he asks for pain pills he is told is not time for him to have them yet.    As per chart review, patient has not been given as needed pain medication in over 48 hours.  We discussed use of nausea medicine first and then administration of pain medication to minimize nausea.  Discussed administration of zofran, waiting 30 minutes, and then administering pain medication with RN who was in agreement.   Discussed depression and patient's significant psyc history with Dr. Candiss [name] and recommended psyc consult.   PMT will continue to follow the patient throughout his hospitalization.   Physical Exam Vitals reviewed.  Constitutional:      General: He is not in acute distress.    Appearance: He is obese. He is not toxic-appearing.  HENT:     Head: Normocephalic.  Cardiovascular:     Rate and Rhythm: Normal rate.  Pulmonary:  Effort: Pulmonary effort is normal.  Musculoskeletal:        General: Normal range of motion.     Comments: MAETC, generalized weakness  Neurological:     Mental Status: He is alert and oriented to person, place, and time.  Psychiatric:        Mood and Affect: Mood is not anxious.        Behavior: Behavior is not agitated.     Comments: Vocalizes depression             Palliative Assessment/Data: 50%    Total Time 50 minutes  Greater than 50%  of this time was spent counseling and coordinating care related to the above assessment and plan.  Thank you for allowing  the Palliative Medicine Team to assist in the care of this patient.  [place] Brandon Iha, FNP-BC Palliative Medicine Team Team Phone # [phone]
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
## 2022-10-10 NOTE — Progress Notes (Signed)
Contacted [facility] admissions for update on clearance for out-pt clinic placement. Awaiting response.   Melven Sartorius Renal Navigator [phone]

## 2022-10-10 NOTE — Progress Notes (Signed)
[facility] Progress Note     Assessment/ Plan:   **AHRF: requiring O2 to maintain sats - CXR with small effusions, exam suggests mild pum edema.  HFpEF +CKD 5 main issues.  Diurese and follow - may need to see if effusions are amenable to tap if persistently hypoxic.  - continue lasix 120 IV BID (was on lasix 80 po BID). 2g sodium, fluid restricted diet.  Weight daily, I/os.    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  Followed at [place] but last OV was 06/2022 - hasn't attended recommended f/u.  No emergency indications for dialysis but I recommended to do so in light of progressive decline in GFR over past 6 months from 11 to 5, volume issues and low grade uremic symptoms (dysgeusia, nausea).    - was initially declining dialysis after conversations with Dr. Hollie Salk and Dr Johnney Ou - mild uremic symptoms and volume will not get much better until it's more definitively managed with dialysis - palliative care c/s--> GOC have been clarified to do dialysis- greatly appreciate assistance - greatly appreciate assistance VIR placing RIJ TC - HD #1 11/2, HD #2 10/08/22, HD #3 10/10/22 - will need VVS c/s too and vein mapping if he is agreeable; I spoke to him at length and he would not commit. I'm not certain he is willing to commit but will consult VVS this AM. Further education can only help and the more times he hears it, he may slowly become more amenable to permanent access placement. I spent a significant amount of time educating him this AM; he is right arm dominant. - will also start the CLIP process.    **HTN:  home meds have been resume, follow with diuresis  **Renal osteodystrophy - not on binder but phos is ok   **h/o CVA 2014   **Anemia:  f/b ESA clinic outpt - [facility]p 100 on 11/4 qsat; transfuse as needed.      Subjective:   Angry this am, refusing permanent access, denies f/c/n/sob   Objective:    BP (!) 177/86 (BP Location: Left Arm)   Pulse 73   Temp 98.6 F (37 C) (Oral)   Resp 18   Ht 6' (1.829 m)   Wt 112.9 kg   SpO2 93%   BMI 33.76 kg/m   Intake/Output Summary (Last 24 hours) at 10/10/2022 0717 Last data filed at 10/09/2022 2300 Gross per 24 hour  Intake 200 ml  Output --  Net 200 ml   Weight change:   Physical Exam: Gen:NAD, lying in bed, angry CVS:RRR Resp: clear Abd: soft Ext: tr pitting edema   Imaging: No results found.  Labs: BMET Recent Labs  Lab 10/04/22 0440 10/05/22 0402 10/06/22 0315 10/07/22 0233 10/08/22 0347 10/09/22 0407 10/10/22 0437  NA 143 143 142 141 140 136 141  K 4.5 4.6 4.7 3.6 3.6 3.9 3.5  CL 110 109 108 105 109 100 103  CO2 '23 23 24 23 24 23 24  '$ GLUCOSE 95 100* 104* 86 78 60* 71  BUN 50* 50* 51* 37* 41* 24* 26*  CREATININE 11.30* 11.54* 11.77* 9.26* 9.93* 7.29* 8.10*  CALCIUM 8.5* 8.3* 8.4* 8.0* 7.7* 7.8* 8.1*  PHOS 5.9* 6.1* 6.1* 4.8* 5.0* 4.4 4.6   CBC Recent Labs  Lab 10/06/22 1634 10/08/22 0347  WBC 3.6* 3.9*  NEUTROABS  --  1.9  HGB 7.2* 7.5*  HCT 22.0* 22.7*  MCV 87.3 87.0  PLT 157 171    Medications:     amLODipine  10 mg Oral Daily   atorvastatin  40 mg Oral Daily   carvedilol  25 mg Oral BID   Chlorhexidine Gluconate Cloth  6 each Topical Q0600   darbepoetin (ARANESP) injection - DIALYSIS  100 mcg Subcutaneous Q Sat-1800   ezetimibe  10 mg Oral Daily   ferrous sulfate  325 mg Oral BID WC   folic acid  1 mg Oral Daily   heparin  5,000 Units Subcutaneous Q8H   isosorbide mononitrate  30 mg Oral Daily   levETIRAcetam  500 mg Oral BID   pantoprazole  40 mg Oral Daily   sertraline  100 mg Oral Daily   sodium bicarbonate  1,300 mg Oral BID   sodium chloride flush  3 mL Intravenous Q12H      Otelia Santee, MD 10/10/2022, 7:17 AM

## 2022-10-10 NOTE — Consult Note (Addendum)
CONSULT NOTE  MRN : [id number]  Reason for Consult: ESRD Referring Physician: Dr. Augustin Coupe  History of Present Illness: 61 y/o male with ESRD on HD via right IJ TDC.  We have been consulted for permanent access.  He is right hand dominant.     Past medical history:  DM,  h/o CVA complicated by seizures, bilateral RCCa s/p ablation (2017-18), OSA, HL, hypothroidism who is currently admitte for hypoxia.     Current Facility-Administered Medications  Medication Dose Route Frequency Provider Last Rate Last Admin   acetaminophen (TYLENOL) tablet 650 mg  650 mg Oral Q6H PRN Fuller Plan A, MD       Or   acetaminophen (TYLENOL) suppository 650 mg  650 mg Rectal Q6H PRN Fuller Plan A, MD       albuterol (PROVENTIL) (2.5 MG/3ML) 0.083% nebulizer solution 2.5 mg  2.5 mg Nebulization Q6H PRN Tamala Julian, Rondell A, MD   2.5 mg at 10/02/22 0855   amLODipine (NORVASC) tablet 10 mg  10 mg Oral Daily Tamala Julian, Rondell A, MD   10 mg at 10/09/22 0817   atorvastatin (LIPITOR) tablet 40 mg  40 mg Oral Daily Smith, Rondell A, MD   40 mg at 10/09/22 0813   carvedilol (COREG) tablet 25 mg  25 mg Oral BID Fuller Plan A, MD   25 mg at 10/09/22 2016   Chlorhexidine Gluconate Cloth 2 % PADS 6 each  6 each Topical Q0600 Madelon Lips, MD   6 each at 10/09/22 0522   Darbepoetin Alfa (ARANESP) injection 100 mcg  100 mcg Subcutaneous Q Sat-1800 Madelon Lips, MD   100 mcg at 10/08/22 2118   ezetimibe (ZETIA) tablet 10 mg  10 mg Oral Daily Fuller Plan A, MD   10 mg at 10/09/22 0817   ferrous sulfate tablet 325 mg  325 mg Oral BID WC Thurnell Lose, MD   325 mg at 12/87/86 7672   folic acid (FOLVITE) tablet 1 mg  1 mg Oral Daily Thurnell Lose, MD   1 mg at 10/09/22 0817   heparin injection 5,000 Units  5,000 Units Subcutaneous Q8H Fuller Plan A, MD   5,000 Units at 10/10/22 0505   HYDROcodone-acetaminophen (NORCO/VICODIN) 5-325 MG per tablet 1 tablet  1 tablet Oral Q6H PRN Fuller Plan A, MD   1 tablet  at 10/08/22 0530   isosorbide mononitrate (IMDUR) 24 hr tablet 30 mg  30 mg Oral Daily Thurnell Lose, MD   30 mg at 10/09/22 0947   labetalol (NORMODYNE) injection 10 mg  10 mg Intravenous Q2H PRN Fuller Plan A, MD   10 mg at 10/03/22 0448   levETIRAcetam (KEPPRA) tablet 500 mg  500 mg Oral BID Fuller Plan A, MD   500 mg at 10/09/22 2016   ondansetron (ZOFRAN) injection 4 mg  4 mg Intravenous Q6H PRN Dahal, Marlowe Aschoff, MD       Oral care mouth rinse  15 mL Mouth Rinse PRN Dahal, Binaya, MD       pantoprazole (PROTONIX) EC tablet 40 mg  40 mg Oral Daily Thurnell Lose, MD   40 mg at 10/09/22 0817   sertraline (ZOLOFT) tablet 100 mg  100 mg Oral Daily Smith, Rondell A, MD   100 mg at 10/09/22 0817   sodium chloride flush (NS) 0.9 % injection 3 mL  3 mL Intravenous Q12H Smith, Rondell A, MD   3 mL at 10/09/22 2200    Pt meds include: Statin :Yes Betablocker:  Yes ASA: No Other anticoagulants/antiplatelets: none  Past Medical History:  Diagnosis Date   Acute ischemic stroke ([place]) 11/2013   Allergy    Anxiety    Arthritis    Benign hypertension with CKD (chronic kidney disease) stage III (HCC)    Bil Renal Ca dx'd 09/2011 & 11/2011   left and right; cryoablation bil   Depression    BH Adm in [place] Depression   Diabetes mellitus    DKA prior hospitalization   Diverticulitis    s/p micorperforation Sept 2012-managed conservatively by Gen surgery   ED (erectile dysfunction)    Focal seizure ([place]) 11/2013   due to ischemic stroke   GERD (gastroesophageal reflux disease)    Hiatal hernia    Hyperlipidemia    Hypertension    Seizures ([place])    none since 2016, taking Keppra - maw   Sleep apnea    Thyroid disease    "weak thyroid" per MD   Wears glasses     Past Surgical History:  Procedure Laterality Date   COLONOSCOPY     IR FLUORO GUIDE CV LINE RIGHT  10/06/2022   IR RADIOLOGIST EVAL & MGMT  04/04/2017   IR US GUIDE VASC ACCESS RIGHT  10/06/2022   KIDNEY SURGERY      ablation of renal cell CA - 12/28, prior one was October [place]   TEE WITHOUT CARDIOVERSION N/A 11/19/2013   Procedure: TRANSESOPHAGEAL ECHOCARDIOGRAM (TEE);  Surgeon: Lelon Perla, MD;  Location: [facility] ENDOSCOPY;  Service: Cardiovascular;  Laterality: N/A;    Social History Social History   Tobacco Use   Smoking status: Never   Smokeless tobacco: Never  Vaping Use   Vaping Use: Never used  Substance Use Topics   Alcohol use: Not Currently    Comment: rarely   Drug use: No    Family History Family History  Problem Relation Age of Onset   Diabetes Father    Heart disease Father    Pneumonia Mother    Prostate cancer Other    Stomach cancer Other    Breast cancer Sister    Colon cancer Neg Hx    Esophageal cancer Neg Hx    Rectal cancer Neg Hx     Allergies  Allergen Reactions   Hydralazine Other (See Comments)    Chest pain if by mouth- can tolerate it via IV   Imdur [Isosorbide Nitrate] Other (See Comments)    headaches   Morphine Itching     REVIEW OF SYSTEMS  General: '[ ]'$  Weight loss, '[ ]'$  Fever, '[ ]'$  chills Neurologic: '[ ]'$  Dizziness, '[ ]'$  Blackouts, '[ ]'$  Seizure '[ ]'$  Stroke, '[ ]'$  "Mini stroke", '[ ]'$  Slurred speech, '[ ]'$  Temporary blindness; '[ ]'$  weakness in arms or legs, '[ ]'$  Hoarseness '[ ]'$  Dysphagia Cardiac: '[ ]'$  Chest pain/pressure, '[ ]'$  Shortness of breath at rest '[ ]'$  Shortness of breath with exertion, '[ ]'$  Atrial fibrillation or irregular heartbeat  Vascular: '[ ]'$  Pain in legs with walking, '[ ]'$  Pain in legs at rest, '[ ]'$  Pain in legs at night,  '[ ]'$  Non-healing ulcer, '[ ]'$  Blood clot in vein/DVT,   Pulmonary: '[ ]'$  Home oxygen, '[ ]'$  Productive cough, '[ ]'$  Coughing up blood, '[ ]'$  Asthma,  '[ ]'$  Wheezing '[ ]'$  COPD Musculoskeletal:  '[ ]'$  Arthritis, '[ ]'$  Low back pain, '[ ]'$  Joint pain Hematologic: '[ ]'$  Easy Bruising, '[ ]'$  Anemia; '[ ]'$  Hepatitis Gastrointestinal: '[ ]'$  Blood in stool, '[ ]'$   Gastroesophageal Reflux/heartburn, Urinary: '[ ]'$  chronic Kidney disease, '[ ]'$  on HD - '[ ]'$   MWF or '[ ]'$  TTHS, '[ ]'$  Burning with urination, '[ ]'$  Difficulty urinating Skin: '[ ]'$  Rashes, '[ ]'$  Wounds Psychological: '[ ]'$  Anxiety, '[ ]'$  Depression  Physical Examination Vitals:   10/09/22 1600 10/09/22 2000 10/10/22 0000 10/10/22 0400  BP: (!) 164/90 (!) 166/84 (!) 170/89 (!) 177/86  Pulse: 73     Resp: '15 16 15 18  '$ Temp: 98.9 F (37.2 C)   98.6 F (37 C)  TempSrc: Oral   Oral  SpO2: 97%   93%  Weight:      Height:       Body mass index is 33.76 kg/m.  General:  WDWN in NAD HENT: WNL Eyes: Pupils equal Pulmonary: normal non-labored breathing , without Rales, rhonchi,  wheezing Cardiac: RRR, without  Murmurs, rubs or gallops; No carotid bruits Abdomen: soft, NT, no masses Skin: no rashes, ulcers noted;  no Gangrene , no cellulitis; no open wounds;   Vascular Exam/Pulses:Palpable radial pulses B UE   Musculoskeletal: no muscle wasting or atrophy; positive B LE edema  Neurologic: A&O X 3; Appropriate Affect ;  SENSATION: normal; MOTOR FUNCTION: 5/5 Symmetric Speech is fluent/normal   Significant Diagnostic Studies: CBC Lab Results  Component Value Date   WBC 3.9 (L) 10/08/2022   HGB 7.5 (L) 10/08/2022   HCT 22.7 (L) 10/08/2022   MCV 87.0 10/08/2022   PLT 171 10/08/2022    BMET    Component Value Date/Time   NA 141 10/10/2022 0437   NA 146 (H) 04/19/2022 1033   K 3.5 10/10/2022 0437   CL 103 10/10/2022 0437   CO2 24 10/10/2022 0437   GLUCOSE 71 10/10/2022 0437   BUN 26 (H) 10/10/2022 0437   BUN 62 (H) 04/19/2022 1033   CREATININE 8.10 (H) 10/10/2022 0437   CREATININE 1.23 06/02/2017 0719   CALCIUM 8.1 (L) 10/10/2022 0437   GFRNONAA 7 (L) 10/10/2022 0437   GFRNONAA 86 05/21/2013 1045   GFRAA 44 (L) 09/18/2020 1436   GFRAA >89 05/21/2013 1045   Estimated Creatinine Clearance: 12.7 mL/min (A) (by C-G formula based on SCr of 8.1 mg/dL (H)).  COAG Lab Results  Component Value Date   INR 1.2 11/29/2021   INR 1.3 (H) 07/04/2021   INR 1.17 01/08/2016      Non-Invasive Vascular Imaging:  Pending vein mapping  ASSESSMENT/PLAN:  Acute on CKD now ESRD on HD via right IJ [facility]  He does not want access currently, how ever he may let us perform vein mapping to prepare for access.  This has been ordered.  Then we can discuss his options.     Roxy Horseman 10/10/2022 8:32 AM  I have independently interviewed and examined patient and agree with PA assessment and plan above.  We will have further discussion with patient once vein mapping is performed and hopefully can provide permanent access prior to discharge.  Chai Routh C. Donzetta Matters, MD [facility] Office: [phone] Pager: [phone]

## 2022-10-11 ENCOUNTER — Inpatient Hospital Stay (HOSPITAL_COMMUNITY): Payer: Medicare Other

## 2022-10-11 DIAGNOSIS — N186 End stage renal disease: Secondary | ICD-10-CM

## 2022-10-11 DIAGNOSIS — J9601 Acute respiratory failure with hypoxia: Secondary | ICD-10-CM | POA: Diagnosis not present

## 2022-10-11 LAB — RENAL FUNCTION PANEL
Albumin: 2.7 g/dL — ABNORMAL LOW (ref 3.5–5.0)
Anion gap: 9 (ref 5–15)
BUN: 13 mg/dL (ref 6–20)
CO2: 29 mmol/L (ref 22–32)
Calcium: 7.8 mg/dL — ABNORMAL LOW (ref 8.9–10.3)
Chloride: 98 mmol/L (ref 98–111)
Creatinine, Ser: 5.77 mg/dL — ABNORMAL HIGH (ref 0.61–1.24)
GFR, Estimated: 11 mL/min — ABNORMAL LOW (ref >60)
Glucose, Bld: 95 mg/dL (ref 70–99)
Phosphorus: 3.5 mg/dL (ref 2.5–4.6)
Potassium: 3.7 mmol/L (ref 3.5–5.1)
Sodium: 136 mmol/L (ref 135–145)

## 2022-10-11 MED ORDER — ENSURE ENLIVE PO LIQD
237.0000 mL | Freq: Two times a day (BID) | ORAL | Status: DC
  Administered 2022-10-11 – 2022-10-15 (×5): 237 mL via ORAL

## 2022-10-11 MED ORDER — RENA-VITE PO TABS
1.0000 | ORAL_TABLET | Freq: Every day | ORAL | Status: DC
  Administered 2022-10-11 – 2022-10-15 (×5): 1 via ORAL
  Filled 2022-10-11 (×5): qty 1

## 2022-10-11 NOTE — Progress Notes (Signed)
Physical Therapy Treatment Patient Details Name: Brandon [name] MRN: [id number] DOB: February 11, 1961 Today's Date: 10/11/2022   History of Present Illness Pt presented to ED on 10/29 with SOB and found to have acute respiratory failure with hypoxia secondary to fluid overload likely due to worsening kidney function. PMH - CVA 2013, anxiety, PTSD, CKD III, bilateral renal cancer, depression, DM, prior hospitalization for DKA, TBI, HTN, DM, DKA and focal seizures (on Keppra),    PT Comments    Patient sitting EOB on therapist arrival to room. C/o frustration regarding long hospital duration and desire to go home. Pt engaged in some questions and confirmed use of SPC for mobility. 1x sit<>stand completed from EOB, pt using bil UE to rise and holding bed rail for support in standing. Pt demonstrated poor safety awareness with cues needed for safety due to tray table in front of pt and lines/leads. Pt sat back to EOB and declined further mobility despite extensive time spent listening to pt, educating, and encouraging pt regarding mobility. He will benefit from continued skilled PT. Anticipate pt will decline SNF and he will return home with assist from friend. Recommend HHPT if pt decline rehab. Will progress as able.   Recommendations for follow up therapy are one component of a multi-disciplinary discharge planning process, led by the attending physician.  Recommendations may be updated based on patient status, additional functional criteria and insurance authorization.  Follow Up Recommendations  Skilled nursing-short term rehab (<3 hours/day) (vs HHPT if pt declines SNF and family/friend are able to assist at home) Can patient physically be transported by private vehicle: Yes   Assistance Recommended at Discharge Intermittent Supervision/Assistance  Patient can return home with the following A little help with walking and/or transfers;A little help with bathing/dressing/bathroom;Assistance with  cooking/housework;Direct supervision/assist for medications management;Direct supervision/assist for financial management;Assist for transportation;Help with stairs or ramp for entrance   Equipment Recommendations  None recommended by PT    Recommendations for Other Services       Precautions / Restrictions Precautions Precautions: Fall Precaution Comments: monitor O2 sats Restrictions Weight Bearing Restrictions: No     Mobility  Bed Mobility               General bed mobility comments: seated EOB    Transfers Overall transfer level: Needs assistance Equipment used: None Transfers: Sit to/from Stand Sit to Stand: Supervision           General transfer comment: supervision with cues for safety as pt impulsive to stand and diregarding tray table in front of him and lines for cardiac monitoring. pt returned to EOB and declined any further mobility despite education and encouragement.    Ambulation/Gait               General Gait Details: pt declined   Marine scientist Rankin (Stroke Patients Only)       Balance Overall balance assessment: Needs assistance Sitting-balance support: No upper extremity supported, Feet supported Sitting balance-Leahy Scale: Good     Standing balance support: No upper extremity supported Standing balance-Leahy Scale: Fair Standing balance comment: stable in static standing at EOB                            Cognition Arousal/Alertness: Awake/alert Behavior During Therapy: Impulsive (irritable) Overall Cognitive Status: History of cognitive impairments - at baseline  General Comments: pt with higher level cognitive deficits at baseline with history of prior CVA. Pt is alert and oriented x4. Pt follows commands well and answers questions appropriately. easily agravated today and impulsive with poor safety awareness. pt  somewhat tangental in conversation and delcining to participate despite encouragement. pt stating he is fine and can do anything he needs himself.        Exercises      General Comments        Pertinent Vitals/Pain Pain Assessment Pain Assessment: Faces Faces Pain Scale: Hurts a little bit Pain Location: generalized Pain Descriptors / Indicators: Discomfort Pain Intervention(s): Monitored during session    Home Living                          Prior Function            PT Goals (current goals can now be found in the care plan section) Acute Rehab PT Goals Patient Stated Goal: to regain strength and become more independent in the home PT Goal Formulation: With patient Time For Goal Achievement: 10/20/22 Potential to Achieve Goals: Fair Progress towards PT goals: Progressing toward goals    Frequency    Min 2X/week      PT Plan Current plan remains appropriate    Co-evaluation              AM-PAC PT "6 Clicks" Mobility   Outcome Measure  Help needed turning from your back to your side while in a flat bed without using bedrails?: None Help needed moving from lying on your back to sitting on the side of a flat bed without using bedrails?: None Help needed moving to and from a bed to a chair (including a wheelchair)?: A Little Help needed standing up from a chair using your arms (e.g., wheelchair or bedside chair)?: A Little Help needed to walk in hospital room?: A Little Help needed climbing 3-5 steps with a railing? : Total 6 Click Score: 18    End of Session Equipment Utilized During Treatment: Gait belt Activity Tolerance: Other (comment) (self limiting) Patient left: in bed;with call bell/phone within reach;with bed alarm set Nurse Communication: Mobility status PT Visit Diagnosis: Other abnormalities of gait and mobility (R26.89);Muscle weakness (generalized) (M62.81)     Time: 1115-1140 PT Time Calculation (min) (ACUTE ONLY): 25  min  Charges:  $Therapeutic Activity: 8-22 mins                     Verner Mould, DPT Acute Rehabilitation Services Office [phone]  10/11/22 11:56 AM

## 2022-10-11 NOTE — Progress Notes (Signed)
  Progress Note    10/11/2022 5:23 PM * No surgery found *  Subjective: No new complaints  Vitals:   10/11/22 1128 10/11/22 1600  BP: 122/64 (!) 119/48  Pulse: (!) 55 60  Resp: 18 10  Temp: 98.9 F (37.2 C)   SpO2: 98%     Physical Exam: Awake oriented Nonlabored respirations   CBC    Component Value Date/Time   WBC 3.9 (L) 10/08/2022 0347   RBC 2.65 (L) 10/08/2022 1821   RBC 2.61 (L) 10/08/2022 0347   HGB 7.5 (L) 10/08/2022 0347   HGB 10.1 (L) 04/19/2022 1033   HCT 22.7 (L) 10/08/2022 0347   HCT 30.2 (L) 04/19/2022 1033   PLT 171 10/08/2022 0347   PLT 229 04/19/2022 1033   MCV 87.0 10/08/2022 0347   MCV 89 04/19/2022 1033   MCH 28.7 10/08/2022 0347   MCHC 33.0 10/08/2022 0347   RDW 13.5 10/08/2022 0347   RDW 12.8 04/19/2022 1033   LYMPHSABS 1.4 10/08/2022 0347   LYMPHSABS 1.8 04/19/2022 1033   MONOABS 0.5 10/08/2022 0347   EOSABS 0.2 10/08/2022 0347   EOSABS 0.1 04/19/2022 1033   BASOSABS 0.0 10/08/2022 0347   BASOSABS 0.0 04/19/2022 1033    BMET    Component Value Date/Time   NA 136 10/11/2022 0445   NA 146 (H) 04/19/2022 1033   K 3.7 10/11/2022 0445   CL 98 10/11/2022 0445   CO2 29 10/11/2022 0445   GLUCOSE 95 10/11/2022 0445   BUN 13 10/11/2022 0445   BUN 62 (H) 04/19/2022 1033   CREATININE 5.77 (H) 10/11/2022 0445   CREATININE 1.23 06/02/2017 0719   CALCIUM 7.8 (L) 10/11/2022 0445   GFRNONAA 11 (L) 10/11/2022 0445   GFRNONAA 86 05/21/2013 1045   GFRAA 44 (L) 09/18/2020 1436   GFRAA >89 05/21/2013 1045    INR    Component Value Date/Time   INR 1.2 11/29/2021 0228     Intake/Output Summary (Last 24 hours) at 10/11/2022 1723 Last data filed at 10/11/2022 1413 Gross per 24 hour  Intake 600 ml  Output 450 ml  Net 150 ml     Assessment/plan:  61 y.o. male is now end-stage renal disease dialyzing via catheter.  Vein mapping performed he is a suitable candidate for fistula of the nondominant left upper extremity.  At this time patient  is not amenable to proceeding, please contact us if and when he is ready and we will schedule him for fistula creation.   Jaeanna Mccomber C. Donzetta Matters, MD [facility] [place] Office: [phone] Pager: [phone]  10/11/2022 5:23 PM

## 2022-10-11 NOTE — Progress Notes (Signed)
Initial Nutrition Assessment  DOCUMENTATION CODES:   Not applicable  INTERVENTION:   Renal Multivitamin w/ minerals daily Diet education Ensure Enlive po BID, each supplement provides 350 kcal and 20 grams of protein. Encourage good PO intake   NUTRITION DIAGNOSIS:   Increased nutrient needs related to chronic illness (CHF, ESRD) as evidenced by estimated needs.  GOAL:   Patient will meet greater than or equal to 90% of their needs  MONITOR:   PO intake, Supplement acceptance, Labs, Weight trends, I & O's  REASON FOR ASSESSMENT:   Consult Diet education  ASSESSMENT:   61 y.o. male presented to the ED with shortness of breath. PMH includes T2DM, HLD, malnutrition, CHF, CVA, depression, HTN, and CKD IV. Pt admitted with acure respiratory failure with hypoxia secondary to fluid overload, AKI on CKD, and hypertensive emergency.   11/02 - Tunneled HD Cath placed; HD started  Pt sitting on edge of bed, leaning head on rail with eyes closed at time of visit. Pt talks to RD but does not open eyes often during visit. Reports that his appetite has decreased since being admitted to the hospital, but it was not great at home either. Reports that he was eating a lot of soup at home. Shares that the number of meals each day varied on his appetite. He states that he was having nausea but was provided medication that reduce it. Denies nay difficulties ordering meals, shares that things have been excellent from the kitchen. Pt willing to have Ensure with ongoing poor appetite.  Reports a 15# weight loss within the past few weeks. Per EMR, pt has lost weight, although unable to really determine significance of weight loss due to chronic conditions and recent start of HD.   Provide pt with renal diet education handouts. Explained the importance of nutrition and prioritize protein with each meal.   Medications reviewed and include: Ferrous Sulfate, Folic Acid, Protonix Labs reviewed.   HD on  11/6 Net UF: 2500 mL Pre HD weight: 110.4 kg Post HD weight: 107.9 kg  NUTRITION - FOCUSED PHYSICAL EXAM:  Deferred to follow-up.   Diet Order:   Diet Order             Diet regular Room service appropriate? Yes; Fluid consistency: Thin  Diet effective now                  EDUCATION NEEDS:  Education needs have been addressed  Skin:  Skin Assessment: Reviewed RN Assessment  Last BM:  11/1  Height:   Ht Readings from Last 1 Encounters:  10/03/22 6' (1.829 m)    Weight:   Wt Readings from Last 1 Encounters:  10/10/22 107.9 kg    Ideal Body Weight:  80.9 kg  BMI:  Body mass index is 32.26 kg/m.  Estimated Nutritional Needs:  Kcal:  2300-2500 Protein:  115-130 grams Fluid:  UOP + 1L    Hermina Barters RD, LDN Clinical Dietitian See Shea Evans for contact information.

## 2022-10-11 NOTE — Progress Notes (Signed)
Upper extremity vein mapping has been completed.   Preliminary results in CV Proc.   Brandon [name] 10/11/2022 3:18 PM
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
## 2022-10-11 NOTE — Progress Notes (Addendum)
Response received from [facility] admissions that pt has been denied by a local out-pt HD clinic. Will await response from [facility] regarding next possible clinic option.   Melven Sartorius Renal Navigator [phone]  Addendum at 2:21 pm: [facility] admissions regarding update on pt's case/referral. Awaiting response.

## 2022-10-11 NOTE — Progress Notes (Signed)
[facility] Progress Note    Assessment/ Plan:   **AHRF: requiring O2 to maintain sats - CXR with small effusions, exam suggests mild pum edema.  HFpEF +CKD 5 main issues.  Diurese and follow - may need to see if effusions are amenable to tap if persistently hypoxic.  - continue lasix 120 IV BID (was on lasix 80 po BID). 2g sodium, fluid restricted diet.  Weight daily, I/os.    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  Followed at [place] but last OV was 06/2022 - hasn't attended recommended f/u.  No emergency indications for dialysis but I recommended to do so in light of progressive decline in GFR over past 6 months from 11 to 5, volume issues and low grade uremic symptoms (dysgeusia, nausea).    - was initially declining dialysis after conversations with Dr. Hollie Salk and Dr Johnney Ou - mild uremic symptoms and volume will not get much better until it's more definitively managed with dialysis - palliative care c/s--> GOC have been clarified to do dialysis- greatly appreciate assistance - greatly appreciate assistance VIR placing RIJ TC - HD #1 11/2, HD #2 10/08/22, HD #3 10/10/22. Tolerated on 11/6 (2.5L); next HD tomorrow. - Appreciate VVS c/s and vein mapping; currently still thinking about permanent access but still not willing to commit. I don't want to pressure him and will continue to daily educate. He is right arm dominant. - Start the CLIP process and renal navigator has already submitted paperwork for placement.    **HTN:  home meds have been resume, follow with diuresis  **Renal osteodystrophy - not on binder but phos is ok   **h/o CVA 2014   **Anemia:  f/b ESA clinic outpt - Aranesp 100 on 11/4 qsat; transfuse as needed.      Subjective:   Not as angry this am, but still would like to think about. Denies f/c/n/sob   Objective:   BP (!) 139/108 (BP Location: Left Arm)   Pulse 68   Temp 97.8 F  (36.6 C) (Oral)   Resp 16   Ht 6' (1.829 m)   Wt 107.9 kg   SpO2 96%   BMI 32.26 kg/m   Intake/Output Summary (Last 24 hours) at 10/11/2022 0742 Last data filed at 10/10/2022 1628 Gross per 24 hour  Intake --  Output 2500 ml  Net -2500 ml   Weight change:   Physical Exam: Gen:NAD, lying in bed, angry CVS:RRR Resp: clear Abd: soft Ext: tr pitting edema   Imaging: No results found.  Labs: BMET Recent Labs  Lab 10/05/22 0402 10/06/22 0315 10/07/22 0233 10/08/22 0347 10/09/22 0407 10/10/22 0437 10/11/22 0445  NA 143 142 141 140 136 141 136  K 4.6 4.7 3.6 3.6 3.9 3.5 3.7  CL 109 108 105 109 100 103 98  CO2 '23 24 23 24 23 24 29  '$ GLUCOSE 100* 104* 86 78 60* 71 95  BUN 50* 51* 37* 41* 24* 26* 13  CREATININE 11.54* 11.77* 9.26* 9.93* 7.29* 8.10* 5.77*  CALCIUM 8.3* 8.4* 8.0* 7.7* 7.8* 8.1* 7.8*  PHOS 6.1* 6.1* 4.8* 5.0* 4.4 4.6 3.5   CBC Recent Labs  Lab 10/06/22 1634 10/08/22 0347  WBC 3.6* 3.9*  NEUTROABS  --  1.9  HGB 7.2* 7.5*  HCT 22.0* 22.7*  MCV 87.3 87.0  PLT 157 171    Medications:     amLODipine  10 mg Oral  Daily   atorvastatin  40 mg Oral Daily   carvedilol  25 mg Oral BID   Chlorhexidine Gluconate Cloth  6 each Topical Q0600   darbepoetin (ARANESP) injection - DIALYSIS  100 mcg Subcutaneous Q Sat-1800   ezetimibe  10 mg Oral Daily   ferrous sulfate  325 mg Oral BID WC   folic acid  1 mg Oral Daily   heparin  5,000 Units Subcutaneous Q8H   isosorbide mononitrate  30 mg Oral Daily   levETIRAcetam  500 mg Oral BID   pantoprazole  40 mg Oral Daily   sertraline  100 mg Oral Daily   sodium chloride flush  3 mL Intravenous Q12H      Otelia Santee, MD 10/11/2022, 7:42 AM

## 2022-10-12 DIAGNOSIS — R0602 Shortness of breath: Secondary | ICD-10-CM | POA: Diagnosis not present

## 2022-10-12 DIAGNOSIS — I509 Heart failure, unspecified: Secondary | ICD-10-CM | POA: Diagnosis not present

## 2022-10-12 DIAGNOSIS — R4189 Other symptoms and signs involving cognitive functions and awareness: Secondary | ICD-10-CM | POA: Diagnosis not present

## 2022-10-12 DIAGNOSIS — I5033 Acute on chronic diastolic (congestive) heart failure: Secondary | ICD-10-CM | POA: Diagnosis not present

## 2022-10-12 DIAGNOSIS — N179 Acute kidney failure, unspecified: Secondary | ICD-10-CM | POA: Diagnosis not present

## 2022-10-12 DIAGNOSIS — J9601 Acute respiratory failure with hypoxia: Secondary | ICD-10-CM | POA: Diagnosis not present

## 2022-10-12 LAB — CBC WITH DIFFERENTIAL/PLATELET
Abs Immature Granulocytes: 0.01 10*3/uL (ref 0.00–0.07)
Basophils Absolute: 0 10*3/uL (ref 0.0–0.1)
Basophils Relative: 1 %
Eosinophils Absolute: 0.2 10*3/uL (ref 0.0–0.5)
Eosinophils Relative: 4 %
HCT: 21.7 % — ABNORMAL LOW (ref 39.0–52.0)
Hemoglobin: 7.7 g/dL — ABNORMAL LOW (ref 13.0–17.0)
Immature Granulocytes: 0 %
Lymphocytes Relative: 33 %
Lymphs Abs: 1.4 10*3/uL (ref 0.7–4.0)
MCH: 30.1 pg (ref 26.0–34.0)
MCHC: 35.5 g/dL (ref 30.0–36.0)
MCV: 84.8 fL (ref 80.0–100.0)
Monocytes Absolute: 0.5 10*3/uL (ref 0.1–1.0)
Monocytes Relative: 11 %
Neutro Abs: 2.2 10*3/uL (ref 1.7–7.7)
Neutrophils Relative %: 51 %
Platelets: 178 10*3/uL (ref 150–400)
RBC: 2.56 MIL/uL — ABNORMAL LOW (ref 4.22–5.81)
RDW: 13.4 % (ref 11.5–15.5)
WBC: 4.3 10*3/uL (ref 4.0–10.5)
nRBC: 0 % (ref 0.0–0.2)

## 2022-10-12 LAB — RENAL FUNCTION PANEL
Albumin: 2.6 g/dL — ABNORMAL LOW (ref 3.5–5.0)
Anion gap: 8 (ref 5–15)
BUN: 17 mg/dL (ref 6–20)
CO2: 28 mmol/L (ref 22–32)
Calcium: 8 mg/dL — ABNORMAL LOW (ref 8.9–10.3)
Chloride: 102 mmol/L (ref 98–111)
Creatinine, Ser: 6.97 mg/dL — ABNORMAL HIGH (ref 0.61–1.24)
GFR, Estimated: 8 mL/min — ABNORMAL LOW (ref >60)
Glucose, Bld: 78 mg/dL (ref 70–99)
Phosphorus: 3.8 mg/dL (ref 2.5–4.6)
Potassium: 3.3 mmol/L — ABNORMAL LOW (ref 3.5–5.1)
Sodium: 138 mmol/L (ref 135–145)

## 2022-10-12 MED ORDER — HEPARIN SODIUM (PORCINE) 1000 UNIT/ML IJ SOLN
INTRAMUSCULAR | Status: AC
  Administered 2022-10-12: 3800 [IU]
  Filled 2022-10-12: qty 4

## 2022-10-12 NOTE — Progress Notes (Signed)
[facility] Progress Note    Assessment/ Plan:   **AHRF: requiring O2 to maintain sats - CXR with small effusions, exam suggests mild pum edema.  HFpEF +CKD 5 main issues.  Diurese initially but now on RRT - may need to see if effusions are amenable to tap if persistently hypoxic.  - Off  lasix now,  2g sodium, fluid restricted diet.  Weight daily, I/os.    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  Followed at [place] but last OV was 06/2022 - hasn't attended recommended f/u.  No emergency indications for dialysis but I recommended to do so in light of progressive decline in GFR over past 6 months from 11 to 5, volume issues and low grade uremic symptoms (dysgeusia, nausea).    - was initially declining dialysis after conversations with Dr. Hollie Salk and Dr Johnney Ou - mild uremic symptoms and volume will not get much better until it's more definitively managed with dialysis - palliative care c/s--> GOC have been clarified to do dialysis- greatly appreciate assistance - greatly appreciate assistance VIR placing RIJ TC; appreciate Dr. Donzetta Matters seeing him. Patient agrees to a perm access now but didn't seem definitive  - HD #1 11/2, HD #2 10/08/22, HD #3 10/10/22. Tolerated on 11/6 (2.5L);  Seen on dialysis through RIJ TC 173/86 2L net UF goal on 4K bath, no changes  - Appreciate VVS c/s and vein mapping; currently still thinking about permanent access but still not willing to commit. I don't want to pressure him and will continue to daily educate. He is right arm dominant. Can do as outpt unless he is definitive. I still did not get that sense but he seems to be making progress processing this data. Will check back again tomorrow. - Started the CLIP process and renal navigator has already submitted paperwork for placement.    **HTN:  home meds have been resume, follow with diuresis  **Renal osteodystrophy - not on binder  but phos is ok   **h/o CVA 2014   **Anemia:  f/b ESA clinic outpt - Aranesp 100 on 11/4 qsat; transfuse as needed.      Subjective:   Much more pleasant this AM. Denies f/c/n/sob   Objective:   BP (!) 176/89   Pulse (!) 59   Temp 98.4 F (36.9 C) (Oral)   Resp 10   Ht 6' (1.829 m)   Wt 108.5 kg   SpO2 92%   BMI 32.44 kg/m   Intake/Output Summary (Last 24 hours) at 10/12/2022 1022 Last data filed at 10/11/2022 1413 Gross per 24 hour  Intake 240 ml  Output --  Net 240 ml   Weight change:   Physical Exam: Gen:NAD, lying in bed,  pleasant  CVS:RRR Resp: clear Abd: soft Ext: tr pitting edema  Access: RIJ TC - hooked up currently  Imaging: VAS [place] UPPER EXT VEIN MAPPING (PRE-OP AVF)  Result Date: 10/11/2022 [place] Patient Name:  Brandon [name]  Date of Exam:   10/11/2022 Medical Rec #: [id number]          Accession #:    [id number] Date of Birth: Sep 29, 1961           Patient Gender: M Patient Age:   62 years Exam Location:  [facility] Procedure:      VAS [place] UPPER EXT VEIN MAPPING (PRE-OP AVF) Referring Phys: Otelia Santee --------------------------------------------------------------------------------  Indications: Pre-access. Performing Technologist: Archie Patten RVS  Examination Guidelines: A complete evaluation includes B-mode imaging, spectral Doppler, color Doppler, and power Doppler as needed of all accessible portions of each vessel. Bilateral testing is considered an integral part of a complete examination. Limited examinations for reoccurring indications may be performed as noted. +-----------------+-------------+----------+---------+ Right Cephalic   Diameter (cm)Depth (cm)Findings  +-----------------+-------------+----------+---------+ Shoulder             0.36        0.69             +-----------------+-------------+----------+---------+ Prox upper arm       0.47        0.80              +-----------------+-------------+----------+---------+ Mid upper arm        0.49        1.01             +-----------------+-------------+----------+---------+ Dist upper arm       0.46        0.69             +-----------------+-------------+----------+---------+ Antecubital fossa    0.47        0.49   branching +-----------------+-------------+----------+---------+ Prox forearm         0.30        0.77             +-----------------+-------------+----------+---------+ Mid forearm          0.33        0.81             +-----------------+-------------+----------+---------+ Dist forearm         0.26        0.80             +-----------------+-------------+----------+---------+ Wrist                0.24        0.56             +-----------------+-------------+----------+---------+ +-----------------+-------------+----------+---------+ Right Basilic    Diameter (cm)Depth (cm)Findings  +-----------------+-------------+----------+---------+ Prox upper arm       0.43        1.13             +-----------------+-------------+----------+---------+ Mid upper arm        0.43        1.18             +-----------------+-------------+----------+---------+ Dist upper arm       0.49        1.00             +-----------------+-------------+----------+---------+ Antecubital fossa    0.30        1.22   branching +-----------------+-------------+----------+---------+ Prox forearm         0.16        0.24             +-----------------+-------------+----------+---------+ Mid forearm          0.15        0.25             +-----------------+-------------+----------+---------+ Distal forearm       0.23        0.26             +-----------------+-------------+----------+---------+ Wrist                0.15        0.25             +-----------------+-------------+----------+---------+ +-----------------+-------------+----------+---------+  Left Cephalic     Diameter (cm)Depth (cm)Findings  +-----------------+-------------+----------+---------+ Shoulder             0.33        0.83             +-----------------+-------------+----------+---------+ Prox upper arm       0.43        0.80             +-----------------+-------------+----------+---------+ Mid upper arm        0.46        0.72             +-----------------+-------------+----------+---------+ Dist upper arm       0.47        0.64             +-----------------+-------------+----------+---------+ Antecubital fossa    0.69        0.74   branching +-----------------+-------------+----------+---------+ Prox forearm         0.30        0.66             +-----------------+-------------+----------+---------+ Mid forearm          0.41        0.74             +-----------------+-------------+----------+---------+ Dist forearm         0.34        0.41             +-----------------+-------------+----------+---------+ Wrist                0.23        0.44             +-----------------+-------------+----------+---------+ +-----------------+-------------+----------+--------+ Left Basilic     Diameter (cm)Depth (cm)Findings +-----------------+-------------+----------+--------+ Prox upper arm       0.50        1.05            +-----------------+-------------+----------+--------+ Mid upper arm        0.55        0.94            +-----------------+-------------+----------+--------+ Dist upper arm       0.46        0.71            +-----------------+-------------+----------+--------+ Antecubital fossa    0.36        0.99            +-----------------+-------------+----------+--------+ Prox forearm         0.32        0.96            +-----------------+-------------+----------+--------+ Mid forearm          0.30        1.22            +-----------------+-------------+----------+--------+ Distal forearm       0.29        0.88             +-----------------+-------------+----------+--------+ Wrist                0.15        0.56            +-----------------+-------------+----------+--------+ *See table(s) above for measurements and observations.  Diagnosing physician: Deitra Mayo MD Electronically signed by Deitra Mayo MD on 10/11/2022 at 6:09:34 PM.    Final     Labs: BMET Recent Labs  Lab 10/06/22 9629 10/07/22 5284 10/08/22 1324 10/09/22 0407 10/10/22 4010 10/11/22  0445 10/12/22 0645  NA 142 141 140 136 141 136 138  K 4.7 3.6 3.6 3.9 3.5 3.7 3.3*  CL 108 105 109 100 103 98 102  CO2 '24 23 24 23 24 29 28  '$ GLUCOSE 104* 86 78 60* 71 95 78  BUN 51* 37* 41* 24* 26* 13 17  CREATININE 11.77* 9.26* 9.93* 7.29* 8.10* 5.77* 6.97*  CALCIUM 8.4* 8.0* 7.7* 7.8* 8.1* 7.8* 8.0*  PHOS 6.1* 4.8* 5.0* 4.4 4.6 3.5 3.8   CBC Recent Labs  Lab 10/06/22 1634 10/08/22 0347 10/12/22 0645  WBC 3.6* 3.9* 4.3  NEUTROABS  --  1.9 2.2  HGB 7.2* 7.5* 7.7*  HCT 22.0* 22.7* 21.7*  MCV 87.3 87.0 84.8  PLT 157 171 178    Medications:     amLODipine  10 mg Oral Daily   atorvastatin  40 mg Oral Daily   carvedilol  25 mg Oral BID   Chlorhexidine Gluconate Cloth  6 each Topical Q0600   darbepoetin (ARANESP) injection - DIALYSIS  100 mcg Subcutaneous Q Sat-1800   ezetimibe  10 mg Oral Daily   feeding supplement  237 mL Oral BID BM   ferrous sulfate  325 mg Oral BID WC   folic acid  1 mg Oral Daily   heparin  5,000 Units Subcutaneous Q8H   isosorbide mononitrate  30 mg Oral Daily   levETIRAcetam  500 mg Oral BID   multivitamin  1 tablet Oral QHS   pantoprazole  40 mg Oral Daily   sertraline  100 mg Oral Daily   sodium chloride flush  3 mL Intravenous Q12H      Otelia Santee, MD 10/12/2022, 10:22 AM

## 2022-10-12 NOTE — Progress Notes (Signed)
                                                     Palliative Care Progress Note, Assessment & Plan   Patient Name: Brandon [name]       Date: 10/12/2022 DOB: [birth year]  Age: 61 y.o. MRN#: [id number] Attending Physician: Thurnell Lose, MD Primary Care Physician: Ronnell Freshwater, NP Admit Date: 10/02/2022  Reason for Consultation/Follow-up: Establishing goals of care  Subjective: Pt is off of unit and in dialysis. No family in room.   Summary of counseling/coordination of care: After reviewing the patient's chart, I spoke with patient's significant other Brandon [name] over the phone.   Therapeutic silence and active listening provided for Brandon [name] to share her thoughts and emotions regarding current medical situation.  She is concerned that patient is unable to process information and make complex medical decisions without her knowledge, presence, and input.  She shares that Brandon [name] only trusts her to help guide him on medical decision making.  She also shares that he oftentimes takes more time and repeated discussions before making a final decision.  She shares his autism contributes to his inability to be flexible and taken information from various sources.  Brandon [name] also shares that PT is extremely upsetting for patient.  She shares he would like for him not to have to work with PT.  However, we discussed the importance of mobility and appropriately assessing his functional ability.  Brandon [name] says patient has continuously declined placement to a skilled nursing facility.  She believes he would only want to go home and be excepting of home health/PT/OT at home.  Brandon [name] is concerned patient will leave AMA as he is becoming frustrated with being away from home and out of his comfort zone for so long.  We reviewed that multiple conversations with patient and Brandon [name] will  continue if patient does appear to want to leave against medical advice, but that he has yet to give any indication of wanting to leave AMA at this time.  Brandon [name] continues to want to encourage patient to allow for permanent HD catheter placement.  She shares she is going to continue to talk with him over the next coming days to help him understand that unless he gets a permanent HD catheter and that he will not be able to continue with HD.  Discussed importance of continued discussions with patient's medical team and patient in order to ensure medical decisions are in line with patient's goals and values.  Emotional support provided.  PMT will continue to follow the patient throughout his hospitalization.  Brandon [name] has requested that members of the medical team keep her updated and informed of patient's medical status so that she can help relay and convey information to patient.         Total Time 35 minutes  Greater than 50%  of this time was spent counseling and coordinating care related to the above assessment and plan.  Thank you for allowing the Palliative Medicine Team to assist in the care of this patient.  Kendall Ilsa Iha, FNP-BC Palliative Medicine Team Team Phone # [phone]
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
## 2022-10-12 NOTE — Progress Notes (Signed)
Contacted [facility] admissions requesting an update regarding pt's clinic placement. Advised pt's case is being reviewed due to clinic denial. Contacted nephrologist to make aware of situation.   Melven Sartorius Renal Navigator [phone]

## 2022-10-12 NOTE — Progress Notes (Signed)
   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  Alert and oriented.  Informed consent signed and in chart.   Treatment initiated: 1610 Treatment completed: 1250  Patient tolerated well.  Transported back to the room  Alert, without acute distress.  Hand-off given to patient's nurse.   Access used: Yes   Access issues: No  Total UF removed: 2000 Medication(s) given: None Post HD VS: 98.2, 72, 18, Spo2 93 RA, 179/91 Post HD weight: 106.3kg   [facility]

## 2022-10-13 DIAGNOSIS — J9601 Acute respiratory failure with hypoxia: Secondary | ICD-10-CM | POA: Diagnosis not present

## 2022-10-13 DIAGNOSIS — I6930 Unspecified sequelae of cerebral infarction: Secondary | ICD-10-CM | POA: Diagnosis not present

## 2022-10-13 DIAGNOSIS — F339 Major depressive disorder, recurrent, unspecified: Secondary | ICD-10-CM | POA: Diagnosis not present

## 2022-10-13 DIAGNOSIS — I5033 Acute on chronic diastolic (congestive) heart failure: Secondary | ICD-10-CM | POA: Diagnosis not present

## 2022-10-13 LAB — RENAL FUNCTION PANEL
Albumin: 2.6 g/dL — ABNORMAL LOW (ref 3.5–5.0)
Anion gap: 6 (ref 5–15)
BUN: 7 mg/dL (ref 6–20)
CO2: 31 mmol/L (ref 22–32)
Calcium: 7.9 mg/dL — ABNORMAL LOW (ref 8.9–10.3)
Chloride: 101 mmol/L (ref 98–111)
Creatinine, Ser: 4.73 mg/dL — ABNORMAL HIGH (ref 0.61–1.24)
GFR, Estimated: 13 mL/min — ABNORMAL LOW (ref >60)
Glucose, Bld: 96 mg/dL (ref 70–99)
Phosphorus: 2.8 mg/dL (ref 2.5–4.6)
Potassium: 3.9 mmol/L (ref 3.5–5.1)
Sodium: 138 mmol/L (ref 135–145)

## 2022-10-13 IMAGING — CR DG FOOT COMPLETE 3+V*L*
3 series · 3 of 3 positions shown · non-contrast
Comparison: None.

CLINICAL DATA: Pain

EXAM:
LEFT FOOT - COMPLETE 3+ VIEW

[t foot ap left]
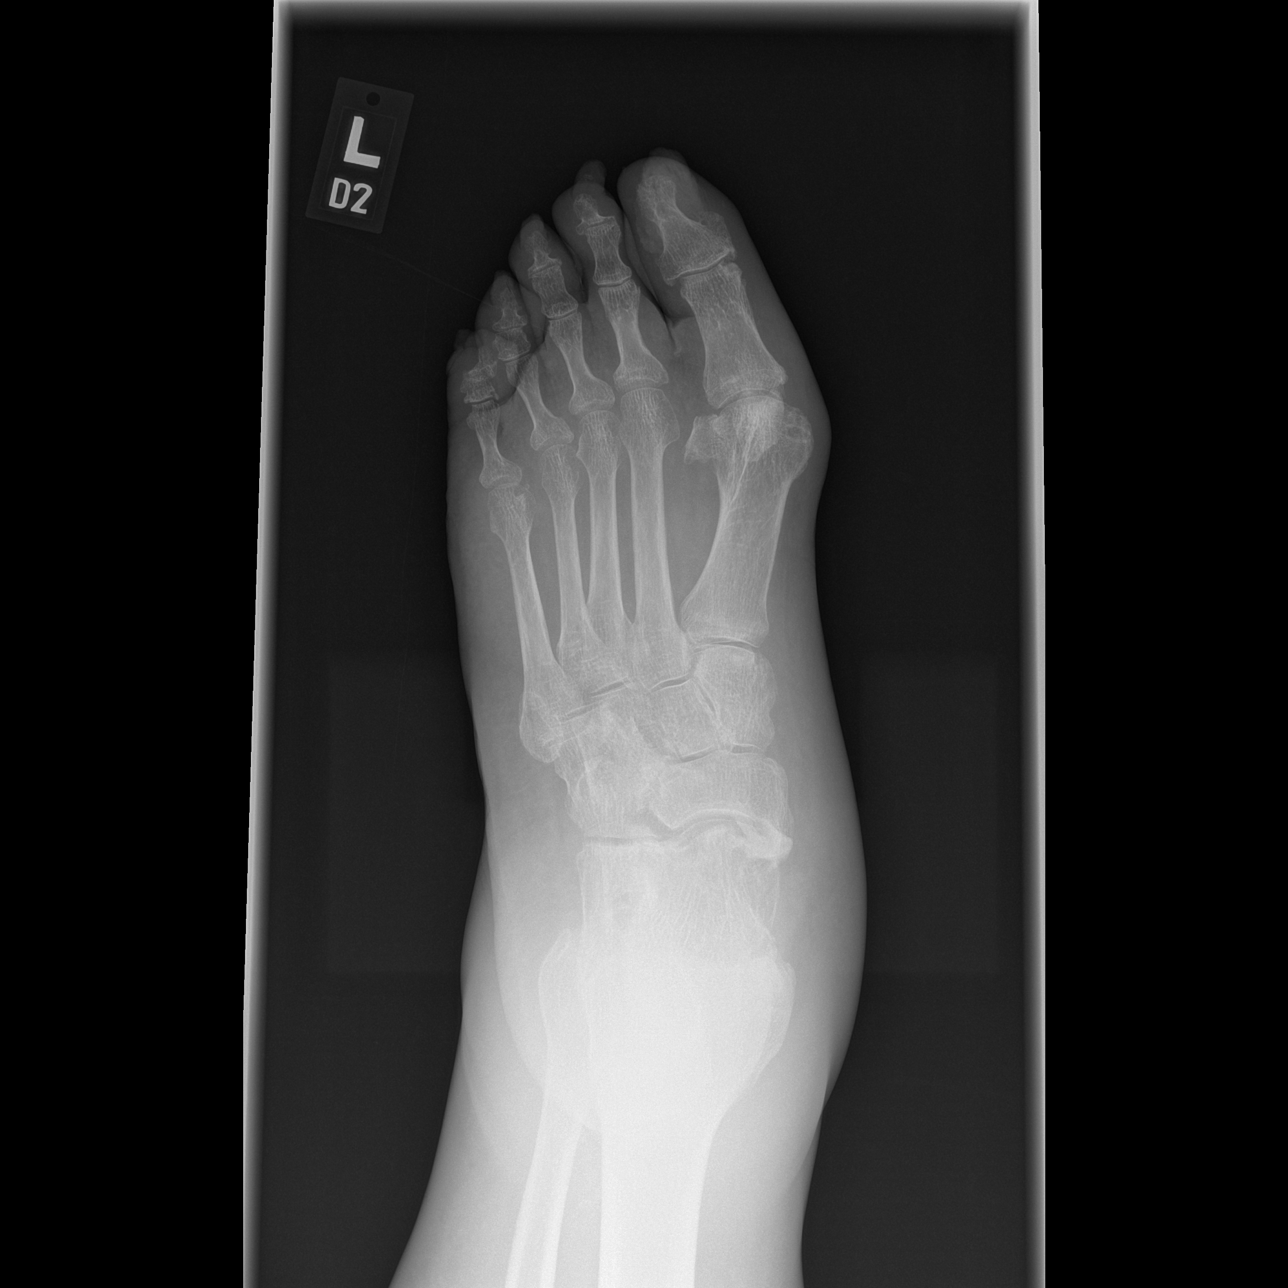

[t foot oblique left]
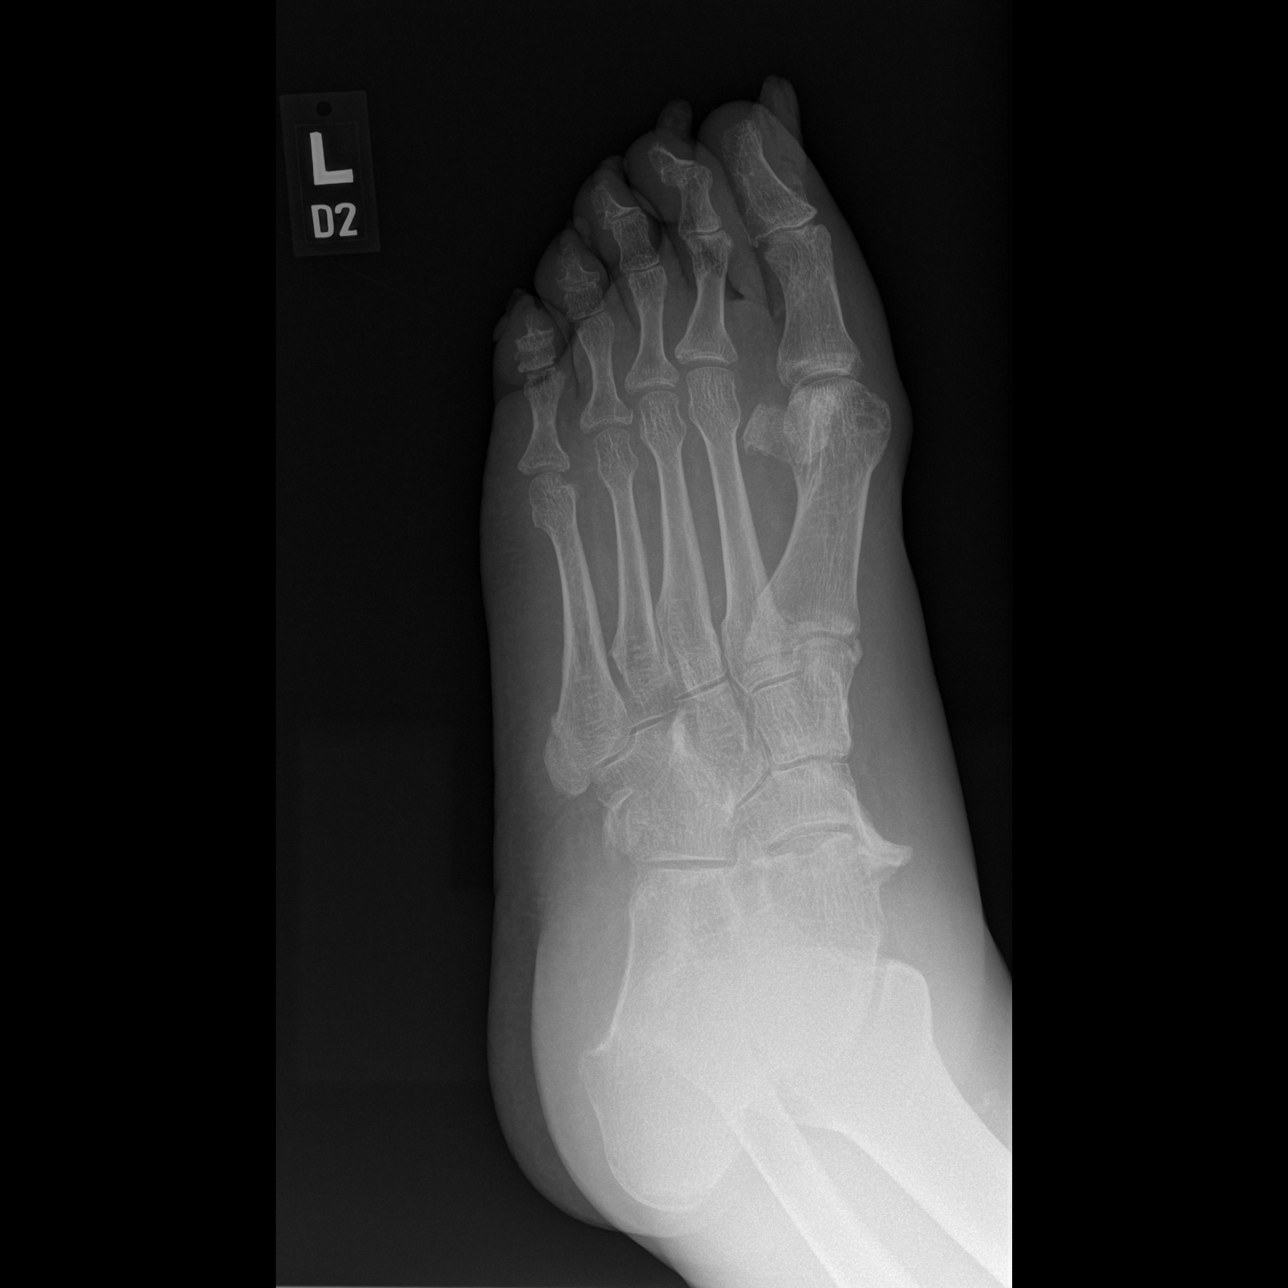

[t foot lat left]
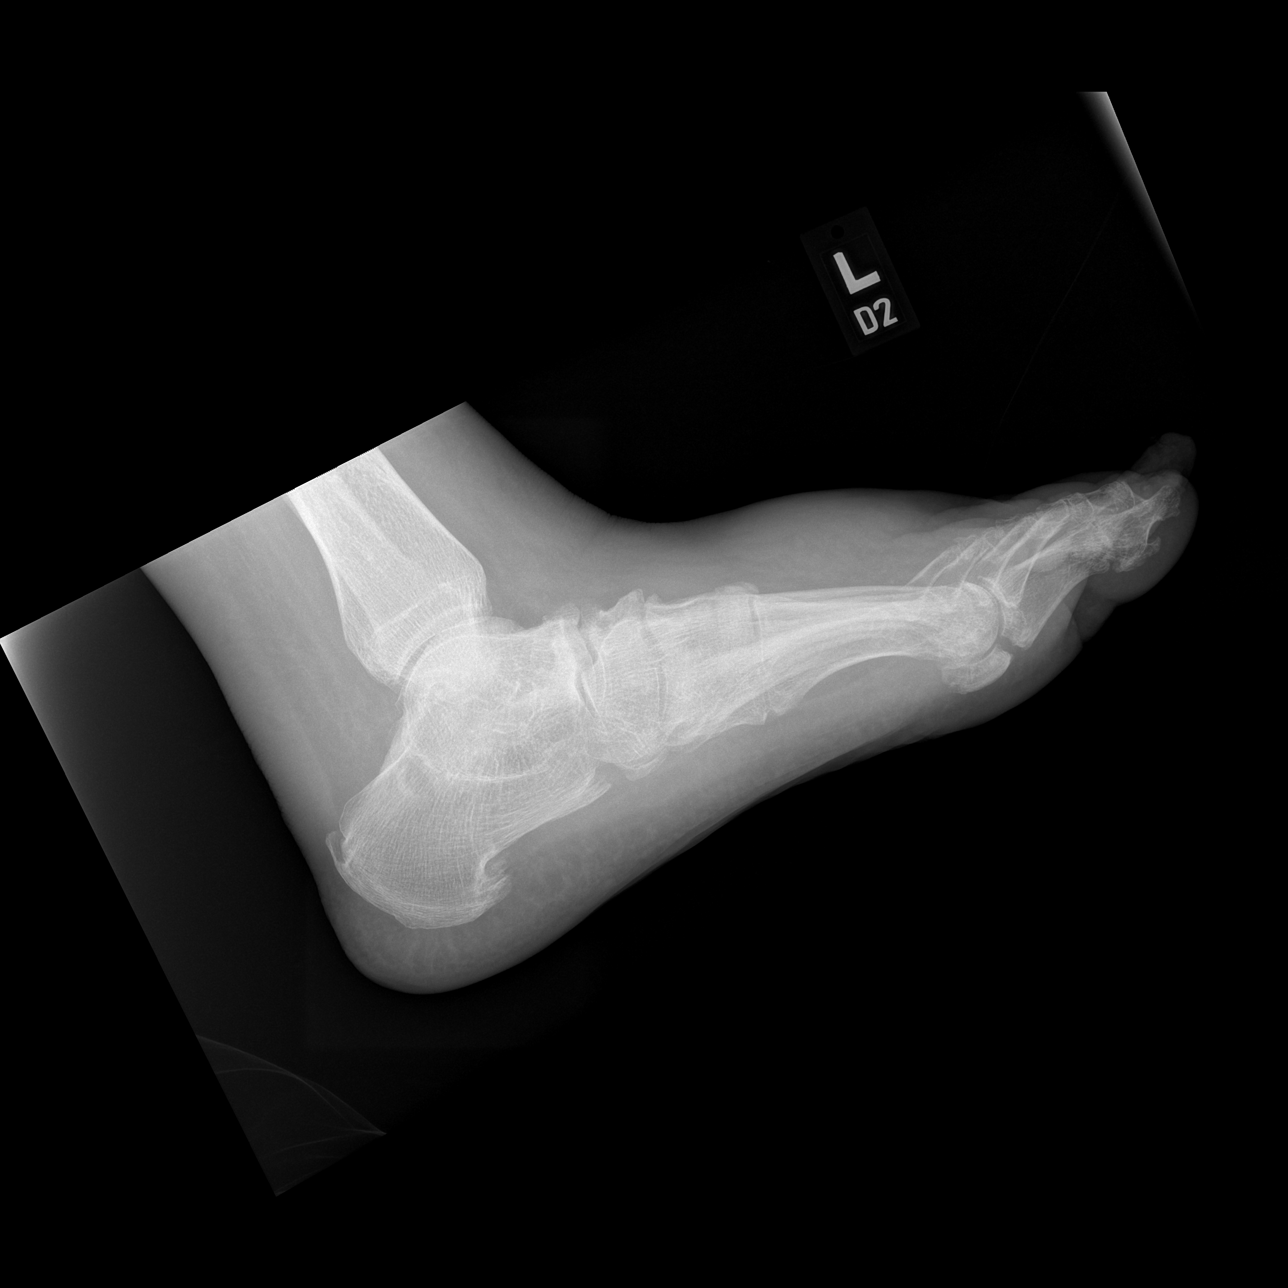

[3 of 3 positions shown; findings below may reference images not displayed]

FINDINGS: No displaced fracture or dislocation is seen. Deformity without
definite radiolucent line in the base of fifth metatarsal may be
residual from previous injury. Hallux valgus deformity is noted.
Degenerative changes are noted in the first and fifth
metatarsophalangeal joints. Bony spurs seen in the interphalangeal
joints, especially prominent in the big toe and fifth toe. Bony
spurs seen in the dorsal aspect of intertarsal joints. There is
marked soft tissue swelling over the dorsum. If there is clinical
suspicion for osteomyelitis, follow-up MRI may be considered.
IMPRESSION: No recent fracture or dislocation is seen. Hallux valgus deformity.
There are no focal lytic lesions. If there is clinical suspicion for
osteomyelitis, follow-up MRI may be considered.

Other findings as described in the body of the report.

## 2022-10-13 MED ORDER — DIPHENHYDRAMINE HCL 25 MG PO CAPS
25.0000 mg | ORAL_CAPSULE | Freq: Three times a day (TID) | ORAL | Status: DC | PRN
  Administered 2022-10-13 – 2022-10-15 (×3): 25 mg via ORAL
  Filled 2022-10-13 (×3): qty 1
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
## 2022-10-13 NOTE — Progress Notes (Signed)
Patient seen and examined this afternoon. At our last visit, Alfard was unsure whether he wanted to pursue permanent HD access.  Considering his options, he would like to move forward with left arm brachiocephalic fistula creation. I have found time for [employer].  Please make n.p.o. at midnight Please continue home medications  Broadus John MD

## 2022-10-13 NOTE — Progress Notes (Signed)
                                                     Palliative Care Progress Note, Assessment & Plan   Patient Name: Brandon [name]       Date: 10/13/2022 DOB: Sep 10, 1961  Age: 61 y.o. MRN#: [id number] Attending Physician: Thurnell Lose, MD Primary Care Physician: Ronnell Freshwater, NP Admit Date: 10/02/2022  Reason for Consultation/Follow-up: Establishing goals of care  Summary of counseling/coordination of care: After reviewing the patient's chart and assessing the patient at bedside, I spoke with patient's significant other [name] over the phone.  [name] shares patient has been in a better mood today and was agreeable for HD permacath to be placed.  I attempted to discuss goals of care with [name].  She shares patient is agreeable to continue with outpatient dialysis at discharge.  She shares he just needed time to process it and come to a decision that felt good to him.  [name] has provided copy of HCPOA paperwork.  PMT RN obtained copy and has submitted it to be downloaded to EMR.  Therapeutic silence and active listening provided for [name] to share her thoughts and emotions regarding current medical situation.  Emotional support provided.  Goals are clear.  [name] is Air traffic controller. Full code and full scope remain.  [name] and patient have PMT contact info were encouraged to call with any palliative needs.  PMT will continue to monitor the patient peripherally.  Please reengage with PMT if goals change, at patient/family's request, or if patient's health deteriorates during hospitalization.  Physical Exam Vitals reviewed.  Constitutional:      General: He is not in acute distress.    Appearance: He is not ill-appearing.  HENT:     Head: Normocephalic.  Cardiovascular:     Rate and Rhythm: Normal rate.  Pulmonary:     Effort: Pulmonary  effort is normal.  Musculoskeletal:     Comments: MAETC  Skin:    General: Skin is warm and dry.  Neurological:     Mental Status: He is alert and oriented to person, place, and time.  Psychiatric:        Mood and Affect: Mood normal.        Behavior: Behavior normal.        Thought Content: Thought content normal.        Judgment: Judgment normal.             Palliative Assessment/Data: 50-60%    Total Time 25 minutes  Greater than 50%  of this time was spent counseling and coordinating care related to the above assessment and plan.  Thank you for allowing the Palliative Medicine Team to assist in the care of this patient.  [place] Ilsa Iha, FNP-BC Palliative Medicine Team Team Phone # [phone]

## 2022-10-13 NOTE — TOC Progression Note (Signed)
Transition of Care [facility]) - Progression Note    Patient Details  Name: Brandon [name] MRN: [id number] Date of Birth: Oct 02, 1961  Transition of Care [facility]) CM/SW Contact  Cyndi Bender, RN Phone Number: 10/13/2022, 12:53 PM  Clinical Narrative:        Patient has been accepted at [facility] 12:00 chair time.  TOC will continue to follow.      Expected Discharge Plan: Home/Self Care Barriers to Discharge: Continued Medical Work up  Expected Discharge Plan and Services Expected Discharge Plan: Home/Self Care   Discharge Planning Services: CM Consult   Living arrangements for the past 2 months: Apartment                                       Social Determinants of Health (SDOH) Interventions    Readmission Risk Interventions    07/09/2021    3:47 PM  Readmission Risk Prevention Plan  Transportation Screening Complete  PCP or Specialist Appt within 3-5 Days Complete  HRI or Home Care Consult Complete  Social Work Consult for [place] Planning/Counseling Complete  Palliative Care Screening Not Applicable

## 2022-10-13 NOTE — Progress Notes (Addendum)
[facility] Progress Note    Assessment/ Plan:   **AHRF: requiring O2 to maintain sats - CXR with small effusions, exam suggests mild pum edema.  HFpEF +CKD 5 main issues.  Diurese initially but now on RRT - may need to see if effusions are amenable to tap if persistently hypoxic.  - Off  lasix now,  2g sodium, fluid restricted diet.  Weight daily, I/os.    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  Followed at [facility] but last OV was 06/2022 - hasn't attended recommended f/u.  No emergency indications for dialysis but I recommended to do so in light of progressive decline in GFR over past 6 months from 11 to 5, volume issues and low grade uremic symptoms (dysgeusia, nausea).    - was initially declining dialysis after conversations with Dr. Hollie Salk and Dr Johnney Ou - mild uremic symptoms and volume will not get much better until it's more definitively managed with dialysis - palliative care c/s--> GOC have been clarified to do dialysis- greatly appreciate assistance - greatly appreciate assistance VIR placing RIJ TC; appreciate Dr. Donzetta Matters seeing him. Patient agrees to a perm access now but didn't seem definitive  - HD #1 11/2, HD #2 10/08/22, HD #3 10/10/22. Tolerated on 11/6 (2.5L) and also on 11/8.  Will plan on HD tomorrow 2nd shift in case Dr. Virl Cagey is able to post him for an access  placement tomorrow. Per Dr. Virl Cagey if he's not able to on Fri then will have to be done as an outpt.  - CLIP'd has TTS spot 12pm to start Tuesday; must arrive at 1115am only 1st treatment to fill out paperwork.   **HTN:  home meds have been resume, follow with diuresis  **Renal osteodystrophy - not on binder but phos is ok   **h/o CVA 2014   **Anemia:  f/b ESA clinic outpt - Aranesp 100 on 11/4 qsat; transfuse as needed.      Subjective:   Cont to be much more pleasant this AM and reasonable; his significant other is really  helping to relay pertinent information to the pt and he's trying to get better. Denies f/c/n/sob   Objective:   BP (!) 144/69 (BP Location: Left Arm)   Pulse 69   Temp 98.2 F (36.8 C) (Oral)   Resp 18   Ht 6' (1.829 m)   Wt 104.2 kg   SpO2 92%   BMI 31.16 kg/m   Intake/Output Summary (Last 24 hours) at 10/13/2022 1058 Last data filed at 10/13/2022 1000 Gross per 24 hour  Intake 120 ml  Output 2000 ml  Net -1880 ml   Weight change:   Physical Exam: Gen:NAD, sitting in chair,  pleasant  CVS:RRR Resp: clear Abd: soft Ext: tr pitting edema  Access: RIJ TC   Imaging: VAS [place] UPPER EXT VEIN MAPPING (PRE-OP AVF)  Result Date: 10/11/2022 [place] MAPPING Patient Name:  CLESTON LAUTNER  Date of Exam:   10/11/2022 Medical Rec #: [id number]          Accession #:    [id number] Date of Birth: 1961/06/22           Patient Gender: M Patient Age:   61 years Exam Location:  [facility] Procedure:      VAS [place] UPPER EXT VEIN MAPPING (PRE-OP AVF) Referring Phys: Otelia Santee --------------------------------------------------------------------------------  Indications: Pre-access. Performing Technologist: Archie Patten RVS  Examination Guidelines: A complete evaluation includes B-mode imaging, spectral Doppler, color Doppler, and power Doppler as needed of all accessible portions of each vessel. Bilateral testing is considered an integral part of a complete examination. Limited examinations for reoccurring indications may be performed as noted. +-----------------+-------------+----------+---------+ Right Cephalic   Diameter (cm)Depth (cm)Findings  +-----------------+-------------+----------+---------+ Shoulder             0.36        0.69             +-----------------+-------------+----------+---------+ Prox upper arm       0.47        0.80             +-----------------+-------------+----------+---------+ Mid upper arm        0.49        1.01              +-----------------+-------------+----------+---------+ Dist upper arm       0.46        0.69             +-----------------+-------------+----------+---------+ Antecubital fossa    0.47        0.49   branching +-----------------+-------------+----------+---------+ Prox forearm         0.30        0.77             +-----------------+-------------+----------+---------+ Mid forearm          0.33        0.81             +-----------------+-------------+----------+---------+ Dist forearm         0.26        0.80             +-----------------+-------------+----------+---------+ Wrist                0.24        0.56             +-----------------+-------------+----------+---------+ +-----------------+-------------+----------+---------+ Right Basilic    Diameter (cm)Depth (cm)Findings  +-----------------+-------------+----------+---------+ Prox upper arm       0.43        1.13             +-----------------+-------------+----------+---------+ Mid upper arm        0.43        1.18             +-----------------+-------------+----------+---------+ Dist upper arm       0.49        1.00             +-----------------+-------------+----------+---------+ Antecubital fossa    0.30        1.22   branching +-----------------+-------------+----------+---------+ Prox forearm         0.16        0.24             +-----------------+-------------+----------+---------+ Mid forearm          0.15        0.25             +-----------------+-------------+----------+---------+ Distal forearm       0.23        0.26             +-----------------+-------------+----------+---------+ Wrist                0.15        0.25             +-----------------+-------------+----------+---------+ +-----------------+-------------+----------+---------+ Left Cephalic  Diameter (cm)Depth (cm)Findings  +-----------------+-------------+----------+---------+ Shoulder              0.33        0.83             +-----------------+-------------+----------+---------+ Prox upper arm       0.43        0.80             +-----------------+-------------+----------+---------+ Mid upper arm        0.46        0.72             +-----------------+-------------+----------+---------+ Dist upper arm       0.47        0.64             +-----------------+-------------+----------+---------+ Antecubital fossa    0.69        0.74   branching +-----------------+-------------+----------+---------+ Prox forearm         0.30        0.66             +-----------------+-------------+----------+---------+ Mid forearm          0.41        0.74             +-----------------+-------------+----------+---------+ Dist forearm         0.34        0.41             +-----------------+-------------+----------+---------+ Wrist                0.23        0.44             +-----------------+-------------+----------+---------+ +-----------------+-------------+----------+--------+ Left Basilic     Diameter (cm)Depth (cm)Findings +-----------------+-------------+----------+--------+ Prox upper arm       0.50        1.05            +-----------------+-------------+----------+--------+ Mid upper arm        0.55        0.94            +-----------------+-------------+----------+--------+ Dist upper arm       0.46        0.71            +-----------------+-------------+----------+--------+ Antecubital fossa    0.36        0.99            +-----------------+-------------+----------+--------+ Prox forearm         0.32        0.96            +-----------------+-------------+----------+--------+ Mid forearm          0.30        1.22            +-----------------+-------------+----------+--------+ Distal forearm       0.29        0.88            +-----------------+-------------+----------+--------+ Wrist                0.15        0.56             +-----------------+-------------+----------+--------+ *See table(s) above for measurements and observations.  Diagnosing physician: Deitra Mayo MD Electronically signed by Deitra Mayo MD on 10/11/2022 at 6:09:34 PM.    Final     Labs: BMET Recent Labs  Lab 10/07/22 2330 10/08/22 0762 10/09/22 0407 10/10/22 2633 10/11/22 3545 10/12/22 0645 10/13/22 0437  NA  141 140 136 141 136 138 138  K 3.6 3.6 3.9 3.5 3.7 3.3* 3.9  CL 105 109 100 103 98 102 101  CO2 '23 24 23 24 29 28 31  '$ GLUCOSE 86 78 60* 71 95 78 96  BUN 37* 41* 24* 26* '13 17 7  '$ CREATININE 9.26* 9.93* 7.29* 8.10* 5.77* 6.97* 4.73*  CALCIUM 8.0* 7.7* 7.8* 8.1* 7.8* 8.0* 7.9*  PHOS 4.8* 5.0* 4.4 4.6 3.5 3.8 2.8   CBC Recent Labs  Lab 10/06/22 1634 10/08/22 0347 10/12/22 0645  WBC 3.6* 3.9* 4.3  NEUTROABS  --  1.9 2.2  HGB 7.2* 7.5* 7.7*  HCT 22.0* 22.7* 21.7*  MCV 87.3 87.0 84.8  PLT 157 171 178    Medications:     amLODipine  10 mg Oral Daily   atorvastatin  40 mg Oral Daily   carvedilol  25 mg Oral BID   Chlorhexidine Gluconate Cloth  6 each Topical Q0600   darbepoetin (ARANESP) injection - DIALYSIS  100 mcg Subcutaneous Q Sat-1800   ezetimibe  10 mg Oral Daily   feeding supplement  237 mL Oral BID BM   ferrous sulfate  325 mg Oral BID WC   folic acid  1 mg Oral Daily   heparin  5,000 Units Subcutaneous Q8H   isosorbide mononitrate  30 mg Oral Daily   levETIRAcetam  500 mg Oral BID   multivitamin  1 tablet Oral QHS   pantoprazole  40 mg Oral Daily   sertraline  100 mg Oral Daily   sodium chloride flush  3 mL Intravenous Q12H      Otelia Santee, MD 10/13/2022, 10:58 AM

## 2022-10-13 NOTE — Progress Notes (Addendum)
Pt has been accepted at [facility] 12:00 chair time. Pt can start on Tuesday, 11/14. Pt will need to arrive at 11:15 for first appt to complete paperwork prior to treatment. Attempted to reach pt's sign other via phone but was not able to reach her or leave a message. Met with pt at bedside and pt requested that I return at a later time when sign other is available. Update provided to attending, nephrologist, RN, RN CM, and CSW via secure chat. Out-pt HD arrangements added to AVS. Will assist as needed.   Melven Sartorius Renal Navigator [phone]  Addendum at 11:30 am: Spoke to pt's sign other, [name], via phone to go over HD arrangements. [name] agreeable to plan.

## 2022-10-14 ENCOUNTER — Other Ambulatory Visit: Payer: Self-pay

## 2022-10-14 ENCOUNTER — Encounter (HOSPITAL_COMMUNITY)
Admission: EM | Disposition: A | Discharge status: Home Health | Payer: Self-pay | Source: Home / Self Care | Attending: Internal Medicine

## 2022-10-14 ENCOUNTER — Inpatient Hospital Stay (HOSPITAL_COMMUNITY): Payer: Medicare Other | Admitting: Anesthesiology

## 2022-10-14 ENCOUNTER — Encounter (HOSPITAL_COMMUNITY): Payer: Self-pay | Admitting: Internal Medicine

## 2022-10-14 DIAGNOSIS — D631 Anemia in chronic kidney disease: Secondary | ICD-10-CM

## 2022-10-14 DIAGNOSIS — N186 End stage renal disease: Secondary | ICD-10-CM

## 2022-10-14 DIAGNOSIS — J9601 Acute respiratory failure with hypoxia: Secondary | ICD-10-CM | POA: Diagnosis not present

## 2022-10-14 DIAGNOSIS — N185 Chronic kidney disease, stage 5: Secondary | ICD-10-CM | POA: Diagnosis not present

## 2022-10-14 DIAGNOSIS — I120 Hypertensive chronic kidney disease with stage 5 chronic kidney disease or end stage renal disease: Secondary | ICD-10-CM

## 2022-10-14 DIAGNOSIS — E1122 Type 2 diabetes mellitus with diabetic chronic kidney disease: Secondary | ICD-10-CM

## 2022-10-14 HISTORY — PX: AV FISTULA PLACEMENT: SHX1204

## 2022-10-14 LAB — CBC
HCT: 23.8 % — ABNORMAL LOW (ref 39.0–52.0)
Hemoglobin: 7.9 g/dL — ABNORMAL LOW (ref 13.0–17.0)
MCH: 29.5 pg (ref 26.0–34.0)
MCHC: 33.2 g/dL (ref 30.0–36.0)
MCV: 88.8 fL (ref 80.0–100.0)
Platelets: 210 10*3/uL (ref 150–400)
RBC: 2.68 MIL/uL — ABNORMAL LOW (ref 4.22–5.81)
RDW: 14 % (ref 11.5–15.5)
WBC: 4.3 10*3/uL (ref 4.0–10.5)
nRBC: 0 % (ref 0.0–0.2)

## 2022-10-14 LAB — POCT I-STAT, CHEM 8
BUN: 12 mg/dL (ref 6–20)
Calcium, Ion: 1.03 mmol/L — ABNORMAL LOW (ref 1.15–1.40)
Chloride: 99 mmol/L (ref 98–111)
Creatinine, Ser: 6.5 mg/dL — ABNORMAL HIGH (ref 0.61–1.24)
Glucose, Bld: 84 mg/dL (ref 70–99)
HCT: 22 % — ABNORMAL LOW (ref 39.0–52.0)
Hemoglobin: 7.5 g/dL — ABNORMAL LOW (ref 13.0–17.0)
Potassium: 4.9 mmol/L (ref 3.5–5.1)
Sodium: 138 mmol/L (ref 135–145)
TCO2: 29 mmol/L (ref 22–32)

## 2022-10-14 LAB — RENAL FUNCTION PANEL
Albumin: 2.7 g/dL — ABNORMAL LOW (ref 3.5–5.0)
Anion gap: 8 (ref 5–15)
BUN: 9 mg/dL (ref 6–20)
CO2: 29 mmol/L (ref 22–32)
Calcium: 8.2 mg/dL — ABNORMAL LOW (ref 8.9–10.3)
Chloride: 100 mmol/L (ref 98–111)
Creatinine, Ser: 5.88 mg/dL — ABNORMAL HIGH (ref 0.61–1.24)
GFR, Estimated: 10 mL/min — ABNORMAL LOW (ref >60)
Glucose, Bld: 88 mg/dL (ref 70–99)
Phosphorus: 3.2 mg/dL (ref 2.5–4.6)
Potassium: 3.9 mmol/L (ref 3.5–5.1)
Sodium: 137 mmol/L (ref 135–145)

## 2022-10-14 LAB — GLUCOSE, CAPILLARY
Glucose-Capillary: 79 mg/dL (ref 70–99)
Glucose-Capillary: 82 mg/dL (ref 70–99)

## 2022-10-14 LAB — POCT ACTIVATED CLOTTING TIME: Activated Clotting Time: 233 seconds

## 2022-10-14 SURGERY — ARTERIOVENOUS (AV) FISTULA CREATION
Anesthesia: General | Site: Arm Lower | Laterality: Left

## 2022-10-14 MED ORDER — PROPOFOL 10 MG/ML IV BOLUS
INTRAVENOUS | Status: AC
  Filled 2022-10-14: qty 20

## 2022-10-14 MED ORDER — HEPARIN SODIUM (PORCINE) 1000 UNIT/ML IJ SOLN
INTRAMUSCULAR | Status: DC | PRN
  Administered 2022-10-14: 3000 [IU] via INTRAVENOUS

## 2022-10-14 MED ORDER — OXYCODONE HCL 5 MG PO TABS: 5.0000 mg | ORAL_TABLET | Freq: Once | ORAL | Status: DC | PRN

## 2022-10-14 MED ORDER — CHLORHEXIDINE GLUCONATE 0.12 % MT SOLN
15.0000 mL | Freq: Once | OROMUCOSAL | Status: AC
  Administered 2022-10-14: 15 mL via OROMUCOSAL

## 2022-10-14 MED ORDER — OXYCODONE HCL 5 MG/5ML PO SOLN: 5.0000 mg | Freq: Once | ORAL | Status: DC | PRN

## 2022-10-14 MED ORDER — FENTANYL CITRATE (PF) 250 MCG/5ML IJ SOLN
INTRAMUSCULAR | Status: AC
  Filled 2022-10-14: qty 5

## 2022-10-14 MED ORDER — ORAL CARE MOUTH RINSE: 15.0000 mL | Freq: Once | OROMUCOSAL | Status: AC

## 2022-10-14 MED ORDER — EPHEDRINE SULFATE-NACL 50-0.9 MG/10ML-% IV SOSY
PREFILLED_SYRINGE | INTRAVENOUS | Status: DC | PRN
  Administered 2022-10-14: 5 mg via INTRAVENOUS

## 2022-10-14 MED ORDER — CEFAZOLIN SODIUM-DEXTROSE 2-4 GM/100ML-% IV SOLN: 2.0000 g | Freq: Once | INTRAVENOUS | Status: DC

## 2022-10-14 MED ORDER — HEPARIN 6000 UNIT IRRIGATION SOLUTION
Status: DC | PRN
  Administered 2022-10-14: 1

## 2022-10-14 MED ORDER — CEFAZOLIN SODIUM-DEXTROSE 2-4 GM/100ML-% IV SOLN
INTRAVENOUS | Status: AC
  Filled 2022-10-14: qty 100

## 2022-10-14 MED ORDER — PHENYLEPHRINE 80 MCG/ML (10ML) SYRINGE FOR IV PUSH (FOR BLOOD PRESSURE SUPPORT)
PREFILLED_SYRINGE | INTRAVENOUS | Status: DC | PRN
  Administered 2022-10-14 (×2): 80 ug via INTRAVENOUS

## 2022-10-14 MED ORDER — ACETAMINOPHEN 500 MG PO TABS
1000.0000 mg | ORAL_TABLET | Freq: Once | ORAL | Status: AC
  Administered 2022-10-14: 1000 mg via ORAL
  Filled 2022-10-14: qty 2

## 2022-10-14 MED ORDER — FENTANYL CITRATE (PF) 100 MCG/2ML IJ SOLN
25.0000 ug | INTRAMUSCULAR | Status: DC | PRN
  Administered 2022-10-14 (×2): 50 ug via INTRAVENOUS

## 2022-10-14 MED ORDER — CEFAZOLIN SODIUM-DEXTROSE 2-3 GM-%(50ML) IV SOLR
INTRAVENOUS | Status: DC | PRN
  Administered 2022-10-14: 2 g via INTRAVENOUS

## 2022-10-14 MED ORDER — ANTICOAGULANT SODIUM CITRATE 4% (200MG/5ML) IV SOLN: 5.0000 mL | Status: DC | PRN

## 2022-10-14 MED ORDER — ONDANSETRON HCL 4 MG/2ML IJ SOLN
INTRAMUSCULAR | Status: DC | PRN
  Administered 2022-10-14: 4 mg via INTRAVENOUS

## 2022-10-14 MED ORDER — HEPARIN 6000 UNIT IRRIGATION SOLUTION
Status: AC
  Filled 2022-10-14: qty 500

## 2022-10-14 MED ORDER — ALTEPLASE 2 MG IJ SOLR: 2.0000 mg | Freq: Once | INTRAMUSCULAR | Status: DC | PRN

## 2022-10-14 MED ORDER — FENTANYL CITRATE (PF) 100 MCG/2ML IJ SOLN
INTRAMUSCULAR | Status: AC
  Filled 2022-10-14: qty 2

## 2022-10-14 MED ORDER — PROPOFOL 10 MG/ML IV BOLUS
INTRAVENOUS | Status: DC | PRN
  Administered 2022-10-14: 110 mg via INTRAVENOUS

## 2022-10-14 MED ORDER — PHENYLEPHRINE 80 MCG/ML (10ML) SYRINGE FOR IV PUSH (FOR BLOOD PRESSURE SUPPORT)
PREFILLED_SYRINGE | INTRAVENOUS | Status: AC
  Filled 2022-10-14: qty 10

## 2022-10-14 MED ORDER — SODIUM CHLORIDE 0.9 % IV SOLN: INTRAVENOUS | Status: DC

## 2022-10-14 MED ORDER — 0.9 % SODIUM CHLORIDE (POUR BTL) OPTIME
TOPICAL | Status: DC | PRN
  Administered 2022-10-14: 1000 mL

## 2022-10-14 MED ORDER — LIDOCAINE 2% (20 MG/ML) 5 ML SYRINGE
INTRAMUSCULAR | Status: DC | PRN
  Administered 2022-10-14: 30 mg via INTRAVENOUS

## 2022-10-14 MED ORDER — FENTANYL CITRATE (PF) 100 MCG/2ML IJ SOLN
INTRAMUSCULAR | Status: DC | PRN
  Administered 2022-10-14: 50 ug via INTRAVENOUS

## 2022-10-14 MED ORDER — ONDANSETRON HCL 4 MG/2ML IJ SOLN: 4.0000 mg | Freq: Once | INTRAMUSCULAR | Status: DC | PRN

## 2022-10-14 MED ORDER — AMISULPRIDE (ANTIEMETIC) 5 MG/2ML IV SOLN: 10.0000 mg | Freq: Once | INTRAVENOUS | Status: DC | PRN

## 2022-10-14 MED ORDER — HEPARIN SODIUM (PORCINE) 1000 UNIT/ML DIALYSIS
1000.0000 [IU] | INTRAMUSCULAR | Status: DC | PRN
  Filled 2022-10-14: qty 1

## 2022-10-14 MED ORDER — EPHEDRINE 5 MG/ML INJ
INTRAVENOUS | Status: AC
  Filled 2022-10-14: qty 5

## 2022-10-14 SURGICAL SUPPLY — 33 items
ARMBAND PINK RESTRICT EXTREMIT (MISCELLANEOUS) ×2 IMPLANT
BAG COUNTER SPONGE SURGICOUNT (BAG) ×1 IMPLANT
CANISTER SUCT 3000ML PPV (MISCELLANEOUS) ×1 IMPLANT
CLIP VESOCCLUDE MED 6/CT (CLIP) ×1 IMPLANT
CLIP VESOCCLUDE SM WIDE 6/CT (CLIP) ×1 IMPLANT
COVER PROBE W GEL 5X96 (DRAPES) ×1 IMPLANT
DERMABOND ADVANCED .7 DNX12 (GAUZE/BANDAGES/DRESSINGS) ×1 IMPLANT
ELECT REM PT RETURN 9FT ADLT (ELECTROSURGICAL) ×1
ELECTRODE REM PT RTRN 9FT ADLT (ELECTROSURGICAL) ×1 IMPLANT
GLOVE BIO SURGEON STRL SZ7.5 (GLOVE) ×1 IMPLANT
GLOVE BIOGEL PI IND STRL 8 (GLOVE) ×1 IMPLANT
GOWN STRL REUS W/ TWL LRG LVL3 (GOWN DISPOSABLE) ×2 IMPLANT
GOWN STRL REUS W/ TWL XL LVL3 (GOWN DISPOSABLE) ×2 IMPLANT
GOWN STRL REUS W/TWL LRG LVL3 (GOWN DISPOSABLE) ×2
GOWN STRL REUS W/TWL XL LVL3 (GOWN DISPOSABLE) ×2
HEMOSTAT SPONGE AVITENE ULTRA (HEMOSTASIS) IMPLANT
KIT BASIN OR (CUSTOM PROCEDURE TRAY) ×1 IMPLANT
KIT TURNOVER KIT B (KITS) ×1 IMPLANT
LOOP VESSEL MINI RED (MISCELLANEOUS) IMPLANT
NS IRRIG 1000ML POUR BTL (IV SOLUTION) ×1 IMPLANT
PACK CV ACCESS (CUSTOM PROCEDURE TRAY) ×1 IMPLANT
PAD ARMBOARD 7.5X6 YLW CONV (MISCELLANEOUS) ×2 IMPLANT
SLING ARM FOAM STRAP LRG (SOFTGOODS) IMPLANT
SLING ARM FOAM STRAP MED (SOFTGOODS) IMPLANT
SPIKE FLUID TRANSFER (MISCELLANEOUS) ×1 IMPLANT
SUT MNCRL AB 4-0 PS2 18 (SUTURE) ×1 IMPLANT
SUT PROLENE 6 0 BV (SUTURE) ×1 IMPLANT
SUT PROLENE 7 0 BV 1 (SUTURE) IMPLANT
SUT VIC AB 3-0 SH 27 (SUTURE) ×1
SUT VIC AB 3-0 SH 27X BRD (SUTURE) ×1 IMPLANT
TOWEL GREEN STERILE (TOWEL DISPOSABLE) ×1 IMPLANT
UNDERPAD 30X36 HEAVY ABSORB (UNDERPADS AND DIAPERS) ×1 IMPLANT
WATER STERILE IRR 1000ML POUR (IV SOLUTION) ×1 IMPLANT

## 2022-10-14 NOTE — Progress Notes (Signed)
PT Cancellation Note  Patient Details Name: COLLEEN DONAHOE MRN: [id number] DOB: Jul 27, 1961   Cancelled Treatment:    Reason Eval/Treat Not Completed: Patient at procedure or test/unavailable - at procedure for fistula, plans for HD afterwards. PT to check back as able.  Stacie Glaze, PT DPT Acute Rehabilitation Services Pager [phone]  Office [phone]    Louis Matte 10/14/2022, 10:56 AM

## 2022-10-14 NOTE — Transfer of Care (Signed)
Immediate Anesthesia Transfer of Care Note  Patient: Brandon [name]  Procedure(s) Performed: LEFT BRACHIOCAPHALIC FISTULA CREATION (Left: Arm Lower)  Patient Location: PACU  Anesthesia Type:General  Level of Consciousness: drowsy  Airway & Oxygen Therapy: Patient Spontanous Breathing and Patient connected to nasal cannula oxygen  Post-op Assessment: Report given to RN and Post -op Vital signs reviewed and stable  Post vital signs: Reviewed and stable  Last Vitals:  Vitals Value Taken Time  BP    Temp    Pulse 55 10/14/22 1314  Resp 12 10/14/22 1314  SpO2 97 % 10/14/22 1314  Vitals shown include unvalidated device data.  Last Pain:  Vitals:   10/14/22 1057  TempSrc: Oral  PainSc: 7          Complications: No notable events documented.
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
## 2022-10-14 NOTE — Progress Notes (Signed)
[facility]  Subjective  - no complaints.   Objective (!) 174/87 70 98 F (36.7 C) (Oral) 16 94%  Intake/Output Summary (Last 24 hours) at 10/14/2022 1124 Last data filed at 10/13/2022 1800 Gross per 24 hour  Intake 240 ml  Output --  Net 240 ml    Left radial and brachial pulse palpable  Laboratory Lab Results: Recent Labs    10/12/22 0645 10/14/22 0249  WBC 4.3 4.3  HGB 7.7* 7.9*  HCT 21.7* 23.8*  PLT 178 210   BMET Recent Labs    10/13/22 0437 10/14/22 0249  NA 138 137  K 3.9 3.9  CL 101 100  CO2 31 29  GLUCOSE 96 88  BUN 7 9  CREATININE 4.73* 5.88*  CALCIUM 7.9* 8.2*    COAG Lab Results  Component Value Date   INR 1.2 11/29/2021   INR 1.3 (H) 07/04/2021   INR 1.17 01/08/2016   No results found for: "PTT"  Assessment/Planning:  Plan left arm AVF.  Risk and benefits discussed.  Marty Heck 10/14/2022 11:24 AM --

## 2022-10-14 NOTE — Progress Notes (Signed)
Pt. Taken to dialysis from PACU, report given to dialysis RN and to [facility].

## 2022-10-14 NOTE — Anesthesia Preprocedure Evaluation (Signed)
Anesthesia Evaluation  Patient identified by MRN, date of birth, ID band Patient awake    Reviewed: Allergy & Precautions, NPO status , Patient's Chart, lab work & pertinent test results  History of Anesthesia Complications Negative for: history of anesthetic complications  Airway Mallampati: II  TM Distance: >3 FB Neck ROM: Full    Dental  (+) Dental Advisory Given   Pulmonary neg pulmonary ROS   Pulmonary exam normal        Cardiovascular hypertension, Normal cardiovascular exam     Neuro/Psych Seizures -,     GI/Hepatic negative GI ROS, Neg liver ROS,,,  Endo/Other  negative endocrine ROSdiabetes    Renal/GU ESRFRenal disease  negative genitourinary   Musculoskeletal negative musculoskeletal ROS (+)    Abdominal   Peds  Hematology  (+) Blood dyscrasia, anemia   Anesthesia Other Findings   Reproductive/Obstetrics                             Anesthesia Physical Anesthesia Plan  ASA: 3  Anesthesia Plan: General   Post-op Pain Management: Tylenol PO (pre-op)*   Induction: Intravenous  PONV Risk Score and Plan: 2 and Ondansetron, Dexamethasone, Midazolam and Treatment may vary due to age or medical condition  Airway Management Planned: LMA  Additional Equipment: None  Intra-op Plan:   Post-operative Plan: Extubation in OR  Informed Consent: I have reviewed the patients History and Physical, chart, labs and discussed the procedure including the risks, benefits and alternatives for the proposed anesthesia with the patient or authorized representative who has indicated his/her understanding and acceptance.     Dental advisory given  Plan Discussed with:   Anesthesia Plan Comments:        Anesthesia Quick Evaluation

## 2022-10-14 NOTE — Op Note (Signed)
OPERATIVE NOTE   PROCEDURE: left brachiocephalic arteriovenous fistula placement  PRE-OPERATIVE DIAGNOSIS: end stage renal disease  POST-OPERATIVE DIAGNOSIS: same as above   SURGEON: Marty Heck, MD  ASSISTANT(S): Vicente Serene, PA  ANESTHESIA:  LMA  ESTIMATED BLOOD LOSS: Minimal  FINDING(S): 1.  Cephalic vein: 5 mm, acceptable 2.  Brachial artery: 4 mm, disease free 3.  Venous outflow: palpable thrill  4.  Radial flow: palpable radial pulse  SPECIMEN(S):  none  INDICATIONS:   Brandon [name] is a 61 y.o. male who presents with end stage renal disease and the need for permanent hemodialysis access.  The patient is scheduled for left arm arteriovenous fistula placement.  The patient is aware the risks include but are not limited to: bleeding, infection, steal syndrome, nerve damage, ischemic monomelic neuropathy, failure to mature, and need for additional procedures.  The patient is aware of the risks of the procedure and elects to proceed forward.   DESCRIPTION: After full informed written consent was obtained from the patient, the patient was brought back to the operating room and placed supine upon the operating table.  Prior to induction, the patient received IV antibiotics.   After obtaining adequate anesthesia, the patient was then prepped and draped in the standard fashion for a left arm access procedure.  I turned my attention first to identifying the patient's cephalic vein and brachial artery and these were of good caliber at the elbow.  Using SonoSite guidance, the location of these vessels were marked out on the skin.     I made a transverse incision at the level of the antecubitum and dissected through the subcutaneous tissue and fascia to gain exposure of the brachial artery.  This was noted to be 4 mm in diameter externally.  This was dissected out proximally and distally and controlled with vessel loops .  I then dissected out the cephalic vein.  This  was noted to be 5 mm in diameter externally.  The distal segment of the vein was ligated with a  2-0 silk, and the vein was transected.  The proximal segment was interrogated with serial dilators.  The vein accepted up to a 5 mm dilator without any difficulty.  I then instilled the heparinized saline into the vein and clamped it.  At this point, I reset my exposure of the brachial artery.  The patient was given 3,000 units IV heparin.  I then placed the artery under tension proximally and distally.  I made an arteriotomy with a #11 blade, and then I extended the arteriotomy with a Potts scissor.  I injected heparinized saline proximal and distal to this arteriotomy.  The vein was then sewn to the artery in an end-to-side configuration with a running stitch of 6-0 Prolene.  Prior to completing this anastomosis, I allowed the vein and artery to backbleed.  There was no evidence of clot from any vessels.  I completed the anastomosis in the usual fashion and then released all vessel loops and clamps.    There was a palpable thrill in the venous outflow, and there was a palpable radial pulse.  At this point, I irrigated out the surgical wound.  There was no further active bleeding.  The subcutaneous tissue was reapproximated with a running stitch of 3-0 Vicryl.  The skin was then reapproximated with a running subcuticular stitch of 4-0 Monocryl.  The skin was then cleaned, dried, and reinforced with Dermabond.  The patient tolerated this procedure well.   COMPLICATIONS:  None  CONDITION: Stable  Marty Heck, MD [facility] Office: [place]   10/14/2022, 12:52 PM

## 2022-10-14 NOTE — Discharge Instructions (Signed)
[facility]  Discharge Instructions  AV Fistula or Graft Surgery for Dialysis Access  Please refer to the following instructions for your post-procedure care. Your surgeon or physician assistant will discuss any changes with you.  Activity  You may drive the day following your surgery, if you are comfortable and no longer taking prescription pain medication. Resume full activity as the soreness in your incision resolves.  Bathing/Showering  You may shower after you go home. Keep your incision dry for 48 hours. Do not soak in a bathtub, hot tub, or swim until the incision heals completely. You may not shower if you have a hemodialysis catheter.  Incision Care  Clean your incision with mild soap and water after 48 hours. Pat the area dry with a clean towel. You do not need a bandage unless otherwise instructed. Do not apply any ointments or creams to your incision. You may have skin glue on your incision. Do not peel it off. It will come off on its own in about one week. Your arm may swell a bit after surgery. To reduce swelling use pillows to elevate your arm so it is above your heart. Your doctor will tell you if you need to lightly wrap your arm with an ACE bandage.  Diet  Resume your normal diet. There are not special food restrictions following this procedure. In order to heal from your surgery, it is CRITICAL to get adequate nutrition. Your body requires vitamins, minerals, and protein. Vegetables are the best source of vitamins and minerals. Vegetables also provide the perfect balance of protein. Processed food has little nutritional value, so try to avoid this.  Medications  Resume taking all of your medications. If your incision is causing pain, you may take over-the counter pain relievers such as acetaminophen (Tylenol). If you were prescribed a stronger pain medication, please be aware these medications can cause nausea and constipation. Prevent  nausea by taking the medication with a snack or meal. Avoid constipation by drinking plenty of fluids and eating foods with high amount of fiber, such as fruits, vegetables, and grains.  Do not take Tylenol if you are taking prescription pain medications.  Follow up Your surgeon may want to see you in the office following your access surgery. If so, this will be arranged at the time of your surgery.  Please call us immediately for any of the following conditions:  Increased pain, redness, drainage (pus) from your incision site Fever of 101 degrees or higher Severe or worsening pain at your incision site Hand pain or numbness.  Reduce your risk of vascular disease:  Stop smoking. If you would like help, call [facility] at 1-800-QUIT-NOW 325-719-9448) or [place] at [place] your cholesterol Maintain a desired weight Control your diabetes Keep your blood pressure down  Dialysis  It will take several weeks to several months for your new dialysis access to be ready for use. Your surgeon will determine when it is okay to use it. Your nephrologist will continue to direct your dialysis. You can continue to use your Permcath until your new access is ready for use.   10/14/2022 Brandon [name] [id number] [birth year]  Surgeon(s): Marty Heck, MD  Procedure(s): LEFT [facility] FISTULA CREATION   May stick graft immediately   May stick graft on designated area only:   x Do not stick fistula for 12 weeks    If you have any questions, please call the office at [phone].  You have to go for dialysis coming Tuesday 12 in the afternoon on the given address in your discharge follow-up instructions.    Follow with Primary MD Ronnell Freshwater, NP in 7 days   Get CBC, CMP, 2 view Chest X ray -  checked next visit with your primary MD   Activity: As tolerated with Full fall precautions use walker/cane & assistance as needed  Disposition Home     Diet: Renal diet with 1.5 L fluid restriction per day  Special Instructions: If you have smoked or chewed Tobacco  in the last 2 yrs please stop smoking, stop any regular Alcohol  and or any Recreational drug use.  On your next visit with your primary care physician please Get Medicines reviewed and adjusted.  Please request your Prim.MD to go over all Hospital Tests and Procedure/Radiological results at the follow up, please get all Hospital records sent to your Prim MD by signing hospital release before you go home.  If you experience worsening of your admission symptoms, develop shortness of breath, life threatening emergency, suicidal or homicidal thoughts you must seek medical attention immediately by calling 911 or calling your MD immediately  if symptoms less severe.  You Must read complete instructions/literature along with all the possible adverse reactions/side effects for all the Medicines you take and that have been prescribed to you. Take any new Medicines after you have completely understood and accpet all the possible adverse reactions/side effects.

## 2022-10-14 NOTE — Anesthesia Procedure Notes (Signed)
Procedure Name: LMA Insertion Date/Time: 10/14/2022 11:45 AM  Performed by: Eligha Bridegroom, CRNAPre-anesthesia Checklist: Patient identified, Emergency Drugs available, Suction available, Patient being monitored and Timeout performed Patient Re-evaluated:Patient Re-evaluated prior to induction Oxygen Delivery Method: Circle system utilized Preoxygenation: Pre-oxygenation with 100% oxygen Induction Type: IV induction LMA: LMA flexible inserted LMA Size: 5.0 Placement Confirmation: positive ETCO2 and breath sounds checked- equal and bilateral

## 2022-10-14 NOTE — Progress Notes (Signed)
[facility] Progress Note    Assessment/ Plan:   **AHRF: requiring O2 to maintain sats - CXR with small effusions, exam suggests mild pum edema.  HFpEF +CKD 5 main issues.  Diurese initially but now on RRT - may need to see if effusions are amenable to tap if persistently hypoxic.  - Off  lasix now,  2g sodium, fluid restricted diet.  Weight daily, I/os.    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  Followed at [place] but last OV was 06/2022 - hasn't attended recommended f/u.  No emergency indications for dialysis but I recommended to do so in light of progressive decline in GFR over past 6 months from 11 to 5, volume issues and low grade uremic symptoms (dysgeusia, nausea).    - was initially declining dialysis after conversations with Dr. Hollie Salk and Dr Johnney Ou - mild uremic symptoms and volume will not get much better until it's more definitively managed with dialysis - palliative care c/s--> GOC have been clarified to do dialysis- greatly appreciate assistance - greatly appreciate assistance VIR placing RIJ TC; appreciate Dr. Donzetta Matters seeing him. Patient agrees to a perm access now but didn't seem definitive  - HD #1 11/2, HD #2 10/08/22, HD #3 10/10/22. Tolerated on 11/6 (2.5L) and also on 11/8.  Much appreciate Dr. Virl Cagey being able to post him for an access  placement today; captive audience and pt now willing to have access placed. Confirmed with pt again this am.  Plan on HD with only 1L net UF late 2nd shift to accommodate surgery.  - CLIP'd has TTS spot 12pm to start Tuesday; must arrive at 1115am only 1st treatment to fill out paperwork. OK to go till Tuesday for dialysis after HD today.   **HTN:  home meds have been resume, follow with diuresis  **Renal osteodystrophy - not on binder but phos is ok   **h/o CVA 2014   **Anemia:  f/b ESA clinic outpt - Aranesp 100 on 11/4 qsat; transfuse as needed.       Subjective:   Cont to be much more pleasant this AM and reasonable; his significant other is really helping to relay pertinent information to the pt and he's trying to get better. Denies f/c/n/sob. Still willing to go to surgery today for access placement.   Objective:   BP (!) 121/46 (BP Location: Left Arm)   Pulse 60   Temp 97.9 F (36.6 C) (Oral)   Resp 15   Ht 6' (1.829 m)   Wt 104.2 kg   SpO2 92%   BMI 31.16 kg/m   Intake/Output Summary (Last 24 hours) at 10/14/2022 0720 Last data filed at 10/13/2022 1800 Gross per 24 hour  Intake 360 ml  Output --  Net 360 ml   Weight change:   Physical Exam: Gen:NAD, supine in bed,  pleasant  CVS:RRR Resp: clear Abd: soft Ext: tr pitting edema  Access: RIJ TC   Imaging: No results found.  Labs: BMET Recent Labs  Lab 10/08/22 0347 10/09/22 0407 10/10/22 0437 10/11/22 0445 10/12/22 0645 10/13/22 0437 10/14/22 0249  NA 140 136 141 136 138 138 137  K 3.6 3.9 3.5 3.7 3.3* 3.9 3.9  CL 109 100 103 98 102 101 100  CO2 '24 23 24 29 28 31 29  '$ GLUCOSE 78 60* 71 95 78 96 88  BUN 41* 24* 26* '13 17 7 9  '$ CREATININE  9.93* 7.29* 8.10* 5.77* 6.97* 4.73* 5.88*  CALCIUM 7.7* 7.8* 8.1* 7.8* 8.0* 7.9* 8.2*  PHOS 5.0* 4.4 4.6 3.5 3.8 2.8 3.2   CBC Recent Labs  Lab 10/08/22 0347 10/12/22 0645 10/14/22 0249  WBC 3.9* 4.3 4.3  NEUTROABS 1.9 2.2  --   HGB 7.5* 7.7* 7.9*  HCT 22.7* 21.7* 23.8*  MCV 87.0 84.8 88.8  PLT 171 178 210    Medications:     amLODipine  10 mg Oral Daily   atorvastatin  40 mg Oral Daily   carvedilol  25 mg Oral BID   Chlorhexidine Gluconate Cloth  6 each Topical Q0600   darbepoetin (ARANESP) injection - DIALYSIS  100 mcg Subcutaneous Q Sat-1800   ezetimibe  10 mg Oral Daily   feeding supplement  237 mL Oral BID BM   ferrous sulfate  325 mg Oral BID WC   folic acid  1 mg Oral Daily   heparin  5,000 Units Subcutaneous Q8H   isosorbide mononitrate  30 mg Oral Daily   levETIRAcetam  500 mg Oral BID    multivitamin  1 tablet Oral QHS   pantoprazole  40 mg Oral Daily   sertraline  100 mg Oral Daily   sodium chloride flush  3 mL Intravenous Q12H      Otelia Santee, MD 10/14/2022, 7:20 AM

## 2022-10-15 DIAGNOSIS — I5033 Acute on chronic diastolic (congestive) heart failure: Secondary | ICD-10-CM | POA: Diagnosis not present

## 2022-10-15 DIAGNOSIS — N179 Acute kidney failure, unspecified: Secondary | ICD-10-CM | POA: Diagnosis not present

## 2022-10-15 DIAGNOSIS — J9601 Acute respiratory failure with hypoxia: Secondary | ICD-10-CM | POA: Diagnosis not present

## 2022-10-15 DIAGNOSIS — N185 Chronic kidney disease, stage 5: Secondary | ICD-10-CM

## 2022-10-15 DIAGNOSIS — R4189 Other symptoms and signs involving cognitive functions and awareness: Secondary | ICD-10-CM | POA: Diagnosis not present

## 2022-10-15 LAB — RENAL FUNCTION PANEL
Albumin: 2.7 g/dL — ABNORMAL LOW (ref 3.5–5.0)
Anion gap: 6 (ref 5–15)
BUN: 12 mg/dL (ref 6–20)
CO2: 28 mmol/L (ref 22–32)
Calcium: 8.1 mg/dL — ABNORMAL LOW (ref 8.9–10.3)
Chloride: 104 mmol/L (ref 98–111)
Creatinine, Ser: 6.8 mg/dL — ABNORMAL HIGH (ref 0.61–1.24)
GFR, Estimated: 9 mL/min — ABNORMAL LOW (ref >60)
Glucose, Bld: 111 mg/dL — ABNORMAL HIGH (ref 70–99)
Phosphorus: 3.7 mg/dL (ref 2.5–4.6)
Potassium: 4.1 mmol/L (ref 3.5–5.1)
Sodium: 138 mmol/L (ref 135–145)

## 2022-10-15 LAB — CBC
HCT: 22.9 % — ABNORMAL LOW (ref 39.0–52.0)
Hemoglobin: 7.8 g/dL — ABNORMAL LOW (ref 13.0–17.0)
MCH: 29.9 pg (ref 26.0–34.0)
MCHC: 34.1 g/dL (ref 30.0–36.0)
MCV: 87.7 fL (ref 80.0–100.0)
Platelets: 202 10*3/uL (ref 150–400)
RBC: 2.61 MIL/uL — ABNORMAL LOW (ref 4.22–5.81)
RDW: 14.3 % (ref 11.5–15.5)
WBC: 4.7 10*3/uL (ref 4.0–10.5)
nRBC: 0 % (ref 0.0–0.2)

## 2022-10-15 LAB — GLUCOSE, CAPILLARY
Glucose-Capillary: 70 mg/dL (ref 70–99)
Glucose-Capillary: 107 mg/dL — ABNORMAL HIGH (ref 70–99)

## 2022-10-15 NOTE — Progress Notes (Signed)
[facility] [place]  Subjective  - no complaints.   Objective (!) 149/110 68 98.3 F (36.8 C) (Oral) 18 95%  Intake/Output Summary (Last 24 hours) at 10/15/2022 0832 Last data filed at 10/14/2022 1535 Gross per 24 hour  Intake 200.17 ml  Output 10 ml  Net 190.17 ml    Left brachiocephalic AVF with excellent thrill No hematoma at left arm incision Left radial pulse palpable  Laboratory Lab Results: Recent Labs    10/14/22 0249 10/14/22 1223  WBC 4.3  --   HGB 7.9* 7.5*  HCT 23.8* 22.0*  PLT 210  --    BMET Recent Labs    10/14/22 0249 10/14/22 1223 10/15/22 0208  NA 137 138 138  K 3.9 4.9 4.1  CL 100 99 104  CO2 29  --  28  GLUCOSE 88 84 111*  BUN '9 12 12  '$ CREATININE 5.88* 6.50* 6.80*  CALCIUM 8.2*  --  8.1*    COAG Lab Results  Component Value Date   INR 1.2 11/29/2021   INR 1.3 (H) 07/04/2021   INR 1.17 01/08/2016   No results found for: "PTT"  Assessment/Planning:  Postop day 1 status post left brachiocephalic AV fistula.  Seen in dialysis and fistula has excellent thrill.  Palpable radial pulse at the wrist.  No signs of steal.  Discussed follow-up in 4 to 6 weeks with fistula duplex that we will arrange.  Discussed this will take 3 months to mature and he will need to use his catheter in the interim.  Please call vascular if any questions or concerns.  OK for discharge from our standpoint.  Marty Heck 10/15/2022 8:32 AM --

## 2022-10-15 NOTE — Progress Notes (Signed)
[facility] Progress Note    Assessment/ Plan:   **AHRF: requiring O2 to maintain sats - CXR with small effusions, exam suggests mild pum edema.  HFpEF +CKD 5 main issues.  Diurese initially but now on RRT - may need to see if effusions are amenable to tap if persistently hypoxic.  - Off  lasix now,  2g sodium, fluid restricted diet.  Weight daily, I/os.    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  Followed at [place] but last OV was 06/2022 - hasn't attended recommended f/u.  No emergency indications for dialysis but I recommended to do so in light of progressive decline in GFR over past 6 months from 11 to 5, volume issues and low grade uremic symptoms (dysgeusia, nausea).    - was initially declining dialysis after conversations with Dr. Hollie Salk and Dr Johnney Ou - mild uremic symptoms and volume will not get much better until it's more definitively managed with dialysis - palliative care c/s--> GOC have been clarified to do dialysis- greatly appreciate assistance - greatly appreciate assistance VIR placing RIJ TC; appreciate Dr. Donzetta Matters seeing him. Appreciate Dr. Carlis Abbott placing a left BCF on 11/10 (good bruit), soreness a little better today.  Patient agrees to a perm access now but didn't seem definitive  - HD #1 11/2, HD #2 10/08/22, HD #3 10/10/22. Tolerated on 11/6 (2.5L) and also on 11/8.  Refused HD yest bec too sore and will be run this AM; should be ok for d/c afterwards from renal standpoint.  - CLIP'd has TTS spot 12pm to start Tuesday; must arrive at 1115am only 1st treatment to fill out paperwork.    **HTN:  home meds have been resume, follow with diuresis  **Renal osteodystrophy - not on binder but phos is ok   **h/o CVA 2014   **Anemia:  f/b ESA clinic outpt - Aranesp 100 on 11/4 qsat; transfuse as needed.      Subjective:   Cont to be much more pleasant this AM and reasonable; his significant  other is really helping to relay pertinent information to the pt and he's trying to get better. Refused hd yest bec too sore after HD which is reasonable.    Objective:   BP (!) 173/83 (BP Location: Right Arm)   Pulse 64   Temp 98 F (36.7 C) (Oral)   Resp 20   Ht 6' (1.829 m)   Wt 107.7 kg   SpO2 96%   BMI 32.20 kg/m   Intake/Output Summary (Last 24 hours) at 10/15/2022 0724 Last data filed at 10/14/2022 1535 Gross per 24 hour  Intake 200.17 ml  Output 10 ml  Net 190.17 ml   Weight change:   Physical Exam: Gen:NAD, supine in bed,  pleasant  CVS:RRR Resp: clear Abd: soft Ext: tr pitting edema  Access: RIJ TC, lt BCF with bruit present  Imaging: No results found.  Labs: BMET Recent Labs  Lab 10/09/22 0407 10/10/22 0437 10/11/22 0445 10/12/22 0645 10/13/22 0437 10/14/22 0249 10/14/22 1223 10/15/22 0208  NA 136 141 136 138 138 137 138 138  K 3.9 3.5 3.7 3.3* 3.9 3.9 4.9 4.1  CL 100 103 98 102 101 100 99 104  CO2 '23 24 29 28 31 29  '$ --  28  GLUCOSE 60* 71 95 78 96 88 84 111*  BUN 24* 26* '13 17 7 9 12 12  '$ CREATININE 7.29* 8.10*  5.77* 6.97* 4.73* 5.88* 6.50* 6.80*  CALCIUM 7.8* 8.1* 7.8* 8.0* 7.9* 8.2*  --  8.1*  PHOS 4.4 4.6 3.5 3.8 2.8 3.2  --  3.7   CBC Recent Labs  Lab 10/12/22 0645 10/14/22 0249 10/14/22 1223  WBC 4.3 4.3  --   NEUTROABS 2.2  --   --   HGB 7.7* 7.9* 7.5*  HCT 21.7* 23.8* 22.0*  MCV 84.8 88.8  --   PLT 178 210  --     Medications:     amLODipine  10 mg Oral Daily   atorvastatin  40 mg Oral Daily   carvedilol  25 mg Oral BID   Chlorhexidine Gluconate Cloth  6 each Topical Q0600   darbepoetin (ARANESP) injection - DIALYSIS  100 mcg Subcutaneous Q Sat-1800   ezetimibe  10 mg Oral Daily   feeding supplement  237 mL Oral BID BM   ferrous sulfate  325 mg Oral BID WC   folic acid  1 mg Oral Daily   heparin  5,000 Units Subcutaneous Q8H   isosorbide mononitrate  30 mg Oral Daily   levETIRAcetam  500 mg Oral BID   multivitamin   1 tablet Oral QHS   pantoprazole  40 mg Oral Daily   sertraline  100 mg Oral Daily   sodium chloride flush  3 mL Intravenous Q12H      Otelia Santee, MD 10/15/2022, 7:24 AM

## 2022-10-15 NOTE — Anesthesia Postprocedure Evaluation (Signed)
Anesthesia Post Note  Patient: Brandon [name]  Procedure(s) Performed: LEFT BRACHIOCAPHALIC FISTULA CREATION (Left: Arm Lower)     Patient location during evaluation: PACU Anesthesia Type: General Level of consciousness: awake and alert Pain management: pain level controlled Vital Signs Assessment: post-procedure vital signs reviewed and stable Respiratory status: spontaneous breathing, nonlabored ventilation, respiratory function stable and patient connected to nasal cannula oxygen Cardiovascular status: blood pressure returned to baseline and stable Postop Assessment: no apparent nausea or vomiting Anesthetic complications: no   No notable events documented.  Last Vitals:  Vitals:   10/15/22 0009 10/15/22 0500  BP: 135/80 (!) 173/83  Pulse: 84 64  Resp: 18 20  Temp: 36.4 C 36.7 C  SpO2: 95% 96%    Last Pain:  Vitals:   10/15/22 0500  TempSrc: Oral  PainSc:                  Lidia Collum
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
## 2022-10-15 NOTE — Progress Notes (Signed)
Received patient in bed to unit.  Alert and oriented.  Informed consent signed and in chart.   Treatment initiated: 2395 Treatment completed: 1228  Patient tolerated well.  Transported back to the room  Alert, without acute distress.  Hand-off given to patient's nurse.   Access used: Cath Access issues: none  Total UF removed: 1L Medication(s) given: none Post HD VS: 98.2,70,17,94% Post HD weight: 106.7kg   [facility]

## 2022-10-16 ENCOUNTER — Encounter (HOSPITAL_COMMUNITY): Payer: Self-pay | Admitting: Vascular Surgery

## 2022-10-16 DIAGNOSIS — J9601 Acute respiratory failure with hypoxia: Secondary | ICD-10-CM | POA: Diagnosis not present

## 2022-10-16 LAB — RENAL FUNCTION PANEL
Albumin: 2.5 g/dL — ABNORMAL LOW (ref 3.5–5.0)
Anion gap: 8 (ref 5–15)
BUN: 8 mg/dL (ref 6–20)
CO2: 30 mmol/L (ref 22–32)
Calcium: 7.8 mg/dL — ABNORMAL LOW (ref 8.9–10.3)
Chloride: 99 mmol/L (ref 98–111)
Creatinine, Ser: 4.98 mg/dL — ABNORMAL HIGH (ref 0.61–1.24)
GFR, Estimated: 13 mL/min — ABNORMAL LOW (ref >60)
Glucose, Bld: 140 mg/dL — ABNORMAL HIGH (ref 70–99)
Phosphorus: 2.7 mg/dL (ref 2.5–4.6)
Potassium: 3.7 mmol/L (ref 3.5–5.1)
Sodium: 137 mmol/L (ref 135–145)

## 2022-10-16 LAB — GLUCOSE, CAPILLARY: Glucose-Capillary: 88 mg/dL (ref 70–99)

## 2022-10-16 MED ORDER — ISOSORBIDE MONONITRATE ER 30 MG PO TB24: 30.0000 mg | ORAL_TABLET | Freq: Every day | ORAL | 0 refills | Status: DC

## 2022-10-16 MED ORDER — FOLIC ACID 1 MG PO TABS: 1.0000 mg | ORAL_TABLET | Freq: Every day | ORAL | 0 refills | Status: DC

## 2022-10-16 MED ORDER — LEVETIRACETAM 500 MG PO TABS: 500.0000 mg | ORAL_TABLET | Freq: Two times a day (BID) | ORAL | 0 refills | Status: DC

## 2022-10-16 MED ORDER — RENA-VITE PO TABS: 1.0000 | ORAL_TABLET | Freq: Every day | ORAL | 0 refills | Status: DC

## 2022-10-16 MED ORDER — FERROUS SULFATE 325 (65 FE) MG PO TABS: 325.0000 mg | ORAL_TABLET | Freq: Every day | ORAL | 0 refills | Status: DC

## 2022-10-16 MED ORDER — PANTOPRAZOLE SODIUM 40 MG PO TBEC: 40.0000 mg | DELAYED_RELEASE_TABLET | Freq: Every day | ORAL | 0 refills | Status: DC

## 2022-10-16 MED ORDER — AMLODIPINE BESYLATE 10 MG PO TABS: 10.0000 mg | ORAL_TABLET | Freq: Every day | ORAL | 0 refills | Status: DC

## 2022-10-16 NOTE — Progress Notes (Signed)
Discharge paperwork reviewed with pt and pt's girlfriend. Both verbalized understanding. Pt alert and oriented x 4 in no acute distress upon discharge. Pt has taken all belongings. Girlfriend will transport home via private vehicle.

## 2022-10-16 NOTE — Progress Notes (Signed)
[facility] Progress Note    Assessment/ Plan:   **AHRF: requiring O2 to maintain sats - CXR with small effusions, exam suggests mild pum edema.  HFpEF +CKD 5 main issues.  Diurese initially but now on RRT - may need to see if effusions are amenable to tap if persistently hypoxic.  - Off  lasix now,  2g sodium, fluid restricted diet.  Weight daily, I/os.    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  Followed at [facility] but last OV was 06/2022 - hasn't attended recommended f/u.  No emergency indications for dialysis but I recommended to do so in light of progressive decline in GFR over past 6 months from 11 to 5, volume issues and low grade uremic symptoms (dysgeusia, nausea).    - was initially declining dialysis after conversations with Dr. Hollie Salk and Dr Johnney Ou - mild uremic symptoms and volume will not get much better until it's more definitively managed with dialysis - palliative care c/s--> GOC have been clarified to do dialysis- greatly appreciate assistance - greatly appreciate assistance VIR placing RIJ TC; appreciate Dr. Carlis Abbott placing a left BCF on 11/10 (good bruit), soreness getting better each day.  - HD #1 11/2, HD #2 10/08/22, HD #3 10/10/22. Tolerated on 11/6 (2.5L) and also on 11/8.  - Refused HD Fri bec too sore but tolerated a full tx on 11/11 with net 1L UF completed at 1128PM.  - CLIP'd -> has [facility] w/ Dr. Hollie Salk TTS spot 12pm to start Tuesday; must arrive at 1115am only 1st treatment to fill out paperwork. Patient understands; I told him to wait for hospitalist or nursing staff to tell him he's close to d/c today prior to calling his significant other for transportation.   **HTN:  home meds have been resume, follow with diuresis  **Renal osteodystrophy - not on binder but phos is ok   **h/o CVA 2014   **Anemia:  f/b ESA clinic outpt - [facility] on 11/4 qsat; transfuse as needed.       Subjective:   Cont to be much more pleasant this AM and reasonable; his significant other is really helping to relay pertinent information to the pt and he's trying to get better.   Tolerated  hd yest w/o any issues; soreness in left arm post sx continues to get better each day.     Objective:   BP (!) 147/82 (BP Location: Right Arm)   Pulse 71   Temp 98 F (36.7 C) (Oral)   Resp 16   Ht 6' (1.829 m)   Wt 105.1 kg   SpO2 92%   BMI 31.41 kg/m   Intake/Output Summary (Last 24 hours) at 10/16/2022 0708 Last data filed at 10/15/2022 1228 Gross per 24 hour  Intake --  Output 1000 ml  Net -1000 ml   Weight change: 3.5 kg  Physical Exam: Gen:NAD, supine in bed,  very pleasant  CVS:RRR Resp: clear Abd: soft Ext: tr pitting edema  Access: RIJ TC, lt BCF with bruit present, not as tender today as 2 days ago  Imaging: No results found.  Labs: BMET Recent Labs  Lab 10/10/22 0437 10/11/22 0445 10/12/22 0645 10/13/22 0437 10/14/22 0249 10/14/22 1223 10/15/22 0208 10/16/22 0319  NA 141 136 138 138 137 138 138 137  K 3.5 3.7 3.3* 3.9 3.9 4.9 4.1 3.7  CL 103 98 102 101 100 99 104 99  CO2 '24 29 28 31 29  '$ --  28 30  GLUCOSE 71 95 78 96 88 84 111* 140*  BUN 26* '13 17 7 9 12 12 8  '$ CREATININE 8.10* 5.77* 6.97* 4.73* 5.88* 6.50* 6.80* 4.98*  CALCIUM 8.1* 7.8* 8.0* 7.9* 8.2*  --  8.1* 7.8*  PHOS 4.6 3.5 3.8 2.8 3.2  --  3.7 2.7   CBC Recent Labs  Lab 10/12/22 0645 10/14/22 0249 10/14/22 1223 10/15/22 0841  WBC 4.3 4.3  --  4.7  NEUTROABS 2.2  --   --   --   HGB 7.7* 7.9* 7.5* 7.8*  HCT 21.7* 23.8* 22.0* 22.9*  MCV 84.8 88.8  --  87.7  PLT 178 210  --  202    Medications:     amLODipine  10 mg Oral Daily   atorvastatin  40 mg Oral Daily   carvedilol  25 mg Oral BID   Chlorhexidine Gluconate Cloth  6 each Topical Q0600   darbepoetin (ARANESP) injection - DIALYSIS  100 mcg Subcutaneous Q Sat-1800   ezetimibe  10 mg Oral Daily   feeding supplement  237 mL  Oral BID BM   ferrous sulfate  325 mg Oral BID WC   folic acid  1 mg Oral Daily   heparin  5,000 Units Subcutaneous Q8H   isosorbide mononitrate  30 mg Oral Daily   levETIRAcetam  500 mg Oral BID   multivitamin  1 tablet Oral QHS   pantoprazole  40 mg Oral Daily   sertraline  100 mg Oral Daily   sodium chloride flush  3 mL Intravenous Q12H      Otelia Santee, MD 10/16/2022, 7:08 AM

## 2022-10-16 NOTE — TOC Transition Note (Signed)
Transition of Care [facility]) - CM/SW Discharge Note   Patient Details  Name: Brandon [name] MRN: [id number] Date of Birth: [birth year]  [facility] CM/SW Contact:  Carles Collet, RN Phone Number: 10/16/2022, 9:30 AM   Clinical Narrative:     Discussed DC w attending, no home O2 needs.  Patient has DME needed at home. He is agreeable to [facility] services, and [name]. They will call to set up a time to see him mid week.  No other TOC needs identified for DC  Final next level of care: Home w Home Health Services Barriers to Discharge: No Barriers Identified   Patient Goals and CMS Choice Patient states their goals for this hospitalization and ongoing recovery are:: return home CMS Medicare.gov Compare Post Acute Care list provided to:: Patient Choice offered to / list presented to : Patient  Discharge Placement                       Discharge Plan and Services   Discharge Planning Services: CM Consult            DME Arranged: N/A         HH Arranged: PT, OT HH Agency: [place] Date [place]: 10/16/22 Time HH Agency Contacted: 0930 Representative spoke with at [place]: [name] Interventions     Readmission Risk Interventions    07/09/2021    3:47 PM  Readmission Risk Prevention Plan  Transportation Screening Complete  PCP or Specialist Appt within 3-5 Days Complete  HRI or [place] Complete  Social Work Consult for [place] Planning/Counseling Complete  Palliative Care Screening Not Applicable

## 2022-10-16 NOTE — Discharge Summary (Signed)
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 LA diam:        4.70 cm 1.97 cm/m   RA Area:     21.30 cm LA Vol (A2C):   81.1 ml 33.93 ml/m  RA Volume:   67.30 ml  28.16 ml/m LA Vol (A4C):   56.0 ml 23.43 ml/m LA Biplane Vol: 69.3 ml 28.99 ml/m  AORTIC VALVE LVOT Vmax:   83.60 cm/s LVOT Vmean:  52.900 cm/s LVOT VTI:    0.192 m  AORTA Ao Root diam: 3.70 cm Ao Asc diam:  3.50 cm MITRAL VALVE               TRICUSPID VALVE MV Area (PHT): 2.80  cm    TR Peak grad:   27.2 mmHg MV Decel Time: 271 msec    TR Vmax:        261.00 cm/s MV E velocity: 72.40 cm/s MV A velocity: 71.10 cm/s  SHUNTS MV E/A ratio:  1.02        Systemic VTI:  0.19 m                            Systemic Diam: 2.10 cm Cherlynn Kaiser MD Electronically signed by Cherlynn Kaiser MD Signature Date/Time: 10/03/2022/12:30:47 PM    Final    DG Chest 2 View  Result Date: 10/02/2022 CLINICAL DATA:  61 year old male with history of chest pain and dyspnea. EXAM: CHEST - 2 VIEW COMPARISON:  Chest x-ray 04/20/2022. FINDINGS: Lung volumes are low. Bibasilar opacities may reflect areas of atelectasis and/or consolidation, with superimposed small bilateral pleural effusions (left-greater-than-right). No pneumothorax. No evidence of pulmonary edema. Heart size is normal. Upper mediastinal contours are within normal limits. IMPRESSION: 1. Low lung volumes with bibasilar areas of atelectasis and/or consolidation and superimposed small bilateral pleural effusions (left-greater-than-right). Electronically Signed   By: Vinnie Langton M.D.   On: 10/02/2022 05:41    Micro Results    No results found for this or any previous visit (from the past 240 hour(s)).  Today   Subjective    Brandon [name] today has no headache,no chest abdominal pain,no new weakness tingling or numbness, feels much better wants to go home today.    Objective   Blood pressure (!) 147/82, pulse 71, temperature 98 F (36.7 C), temperature source Oral, resp. rate 16, height 6' (1.829 m), weight 105.1 kg, SpO2 92 %.   Intake/Output Summary (Last 24 hours) at 10/16/2022 0847 Last data filed at 10/15/2022 1228 Gross per 24 hour  Intake --  Output 1000 ml  Net -1000 ml    Exam  Awake Alert, No new F.N deficits, right HD tunneled dialysis catheter, new left arm AV fistula site stable     .AT,PERRAL Supple Neck,   Symmetrical Chest wall movement, Good air movement bilaterally, CTAB RRR,No Gallops,    +ve B.Sounds, Abd Soft, Non tender,  No Cyanosis, Clubbing or edema    Data Review   Recent Labs  Lab 10/12/22 0645 10/14/22 0249 10/14/22 1223 10/15/22 0841  WBC 4.3 4.3  --  4.7  HGB 7.7*  7.9* 7.5* 7.8*  HCT 21.7* 23.8* 22.0* 22.9*  PLT 178 210  --  202  MCV 84.8 88.8  --  87.7  MCH 30.1 29.5  --  29.9  MCHC 35.5 33.2  --  34.1  RDW 13.4 14.0  --  14.3  LYMPHSABS 1.4  --   --   --   MONOABS 0.5  --   --   --   EOSABS 0.2  --   --   --   BASOSABS 0.0  --   --   --     Recent Labs  Lab 10/12/22 0645 10/13/22 0437 10/14/22 0249 10/14/22 1223 10/15/22 0208 10/16/22 0319  NA 138 138 137 138 138 137  K 3.3* 3.9 3.9 4.9 4.1 3.7  CL 102 101 100 99 104 99  CO2 _0 --  28 30  GLUCOSE 78 96 88 84 111* 140*  BUN _1 CREATININE 6.97* 4.73* 5.88* 6.50* 6.80* 4.98*  CALCIUM 8.0* 7.9* 8.2*  --  8.1* 7.8*  ALBUMIN 2.6* 2.6* 2.7*  --  2.7* 2.5*  PHOS 3.8 2.8 3.2  --  3.7 2.7    Total Time in preparing paper work, data evaluation and todays exam - 35 minutes  Lala Lund M.D on 10/16/2022 at 8:47 AM  [facility]

## 2022-10-17 NOTE — Progress Notes (Signed)
Late Entry Note:  Pt was d/c to home yesterday. Contacted [facility] to advise clinic of pt's d/c and that pt will start tomorrow as planned. Inquired if clinic received orders as well.   Melven Sartorius Renal Navigator [phone]

## 2022-10-18 DIAGNOSIS — N186 End stage renal disease: Secondary | ICD-10-CM | POA: Diagnosis not present

## 2022-10-18 DIAGNOSIS — N2581 Secondary hyperparathyroidism of renal origin: Secondary | ICD-10-CM | POA: Diagnosis not present

## 2022-10-18 DIAGNOSIS — D631 Anemia in chronic kidney disease: Secondary | ICD-10-CM | POA: Diagnosis not present

## 2022-10-18 DIAGNOSIS — Z23 Encounter for immunization: Secondary | ICD-10-CM | POA: Diagnosis not present

## 2022-10-18 DIAGNOSIS — E1122 Type 2 diabetes mellitus with diabetic chronic kidney disease: Secondary | ICD-10-CM | POA: Diagnosis not present

## 2022-10-18 DIAGNOSIS — Z992 Dependence on renal dialysis: Secondary | ICD-10-CM | POA: Diagnosis not present

## 2022-10-19 ENCOUNTER — Telehealth: Payer: Self-pay

## 2022-10-19 ENCOUNTER — Telehealth: Payer: Self-pay | Admitting: Vascular Surgery

## 2022-10-19 NOTE — Patient Outreach (Signed)
  Care Coordination TOC Note Transition Care Management Unsuccessful Follow-up Telephone Call  Date of discharge and from where:  10/16/22-[facility]  Attempts:  1st Attempt  Reason for unsuccessful TCM follow-up call:  No answer/busy   Enzo Montgomery, RN,BSN,CCM [facility] Management Telephonic Care Management Coordinator Direct Phone: [phone] Toll Free: [phone] Fax: [phone]

## 2022-10-19 NOTE — Telephone Encounter (Signed)
-----   Message from Marty Heck, MD sent at 10/15/2022  8:34 AM EST ----- Can you arrange follow-up in 4 to 6 weeks in the PA clinic with left arm fistula duplex?  Left brachiocephalic AV fistula as an inpatient.  Thanks,  Gerald Stabs

## 2022-10-20 ENCOUNTER — Telehealth: Payer: Self-pay

## 2022-10-20 DIAGNOSIS — N186 End stage renal disease: Secondary | ICD-10-CM | POA: Diagnosis not present

## 2022-10-20 DIAGNOSIS — Z23 Encounter for immunization: Secondary | ICD-10-CM | POA: Diagnosis not present

## 2022-10-20 DIAGNOSIS — E8770 Fluid overload, unspecified: Secondary | ICD-10-CM | POA: Diagnosis not present

## 2022-10-20 DIAGNOSIS — Z992 Dependence on renal dialysis: Secondary | ICD-10-CM | POA: Diagnosis not present

## 2022-10-20 DIAGNOSIS — D631 Anemia in chronic kidney disease: Secondary | ICD-10-CM | POA: Diagnosis not present

## 2022-10-20 DIAGNOSIS — N2581 Secondary hyperparathyroidism of renal origin: Secondary | ICD-10-CM | POA: Diagnosis not present

## 2022-10-20 NOTE — Patient Outreach (Signed)
  Care Coordination TOC Note Transition Care Management Unsuccessful Follow-up Telephone Call  Date of discharge and from where:  10/16/22-  Attempts:  2nd Attempt  Reason for unsuccessful TCM follow-up call:  Unable to reach patient     Hetty Blend [place] Management Telephonic Care Management Coordinator Direct Phone: [phone] Toll Free: [phone] Fax: [phone]

## 2022-10-21 ENCOUNTER — Telehealth: Payer: Self-pay

## 2022-10-21 ENCOUNTER — Encounter (HOSPITAL_COMMUNITY): Payer: Medicare Other

## 2022-10-21 NOTE — Patient Outreach (Signed)
  Care Coordination TOC Note Transition Care Management Unsuccessful Follow-up Telephone Call  Date of discharge and from where:  10/1222-[facility]   Attempts:  3rd Attempt  Reason for unsuccessful TCM follow-up call:  Unable to reach patient    Hetty Blend [place] Management Telephonic Care Management Coordinator Direct Phone: [phone] Toll Free: [phone] Fax: [phone]

## 2022-10-22 DIAGNOSIS — N2581 Secondary hyperparathyroidism of renal origin: Secondary | ICD-10-CM | POA: Diagnosis not present

## 2022-10-22 DIAGNOSIS — D631 Anemia in chronic kidney disease: Secondary | ICD-10-CM | POA: Diagnosis not present

## 2022-10-22 DIAGNOSIS — N186 End stage renal disease: Secondary | ICD-10-CM | POA: Diagnosis not present

## 2022-10-22 DIAGNOSIS — Z23 Encounter for immunization: Secondary | ICD-10-CM | POA: Diagnosis not present

## 2022-10-22 DIAGNOSIS — Z992 Dependence on renal dialysis: Secondary | ICD-10-CM | POA: Diagnosis not present

## 2022-10-24 DIAGNOSIS — Z23 Encounter for immunization: Secondary | ICD-10-CM | POA: Diagnosis not present

## 2022-10-24 DIAGNOSIS — D631 Anemia in chronic kidney disease: Secondary | ICD-10-CM | POA: Diagnosis not present

## 2022-10-24 DIAGNOSIS — N186 End stage renal disease: Secondary | ICD-10-CM | POA: Diagnosis not present

## 2022-10-24 DIAGNOSIS — Z992 Dependence on renal dialysis: Secondary | ICD-10-CM | POA: Diagnosis not present

## 2022-10-24 DIAGNOSIS — N2581 Secondary hyperparathyroidism of renal origin: Secondary | ICD-10-CM | POA: Diagnosis not present

## 2022-10-26 DIAGNOSIS — N186 End stage renal disease: Secondary | ICD-10-CM | POA: Diagnosis not present

## 2022-10-26 DIAGNOSIS — D631 Anemia in chronic kidney disease: Secondary | ICD-10-CM | POA: Diagnosis not present

## 2022-10-26 DIAGNOSIS — Z992 Dependence on renal dialysis: Secondary | ICD-10-CM | POA: Diagnosis not present

## 2022-10-26 DIAGNOSIS — Z23 Encounter for immunization: Secondary | ICD-10-CM | POA: Diagnosis not present

## 2022-10-26 DIAGNOSIS — N2581 Secondary hyperparathyroidism of renal origin: Secondary | ICD-10-CM | POA: Diagnosis not present

## 2022-10-29 DIAGNOSIS — N2581 Secondary hyperparathyroidism of renal origin: Secondary | ICD-10-CM | POA: Diagnosis not present

## 2022-10-29 DIAGNOSIS — D631 Anemia in chronic kidney disease: Secondary | ICD-10-CM | POA: Diagnosis not present

## 2022-10-29 DIAGNOSIS — N186 End stage renal disease: Secondary | ICD-10-CM | POA: Diagnosis not present

## 2022-10-29 DIAGNOSIS — Z23 Encounter for immunization: Secondary | ICD-10-CM | POA: Diagnosis not present

## 2022-10-29 DIAGNOSIS — Z992 Dependence on renal dialysis: Secondary | ICD-10-CM | POA: Diagnosis not present

## 2022-11-01 ENCOUNTER — Ambulatory Visit: Payer: Medicare Other | Admitting: Nurse Practitioner

## 2022-11-01 DIAGNOSIS — Z23 Encounter for immunization: Secondary | ICD-10-CM | POA: Diagnosis not present

## 2022-11-01 DIAGNOSIS — N186 End stage renal disease: Secondary | ICD-10-CM | POA: Diagnosis not present

## 2022-11-01 DIAGNOSIS — D631 Anemia in chronic kidney disease: Secondary | ICD-10-CM | POA: Diagnosis not present

## 2022-11-01 DIAGNOSIS — Z992 Dependence on renal dialysis: Secondary | ICD-10-CM | POA: Diagnosis not present

## 2022-11-01 DIAGNOSIS — N2581 Secondary hyperparathyroidism of renal origin: Secondary | ICD-10-CM | POA: Diagnosis not present

## 2022-11-03 DIAGNOSIS — N2581 Secondary hyperparathyroidism of renal origin: Secondary | ICD-10-CM | POA: Diagnosis not present

## 2022-11-03 DIAGNOSIS — D631 Anemia in chronic kidney disease: Secondary | ICD-10-CM | POA: Diagnosis not present

## 2022-11-03 DIAGNOSIS — N186 End stage renal disease: Secondary | ICD-10-CM | POA: Diagnosis not present

## 2022-11-03 DIAGNOSIS — Z992 Dependence on renal dialysis: Secondary | ICD-10-CM | POA: Diagnosis not present

## 2022-11-03 DIAGNOSIS — Z23 Encounter for immunization: Secondary | ICD-10-CM | POA: Diagnosis not present

## 2022-11-04 DIAGNOSIS — N186 End stage renal disease: Secondary | ICD-10-CM | POA: Diagnosis not present

## 2022-11-04 DIAGNOSIS — I129 Hypertensive chronic kidney disease with stage 1 through stage 4 chronic kidney disease, or unspecified chronic kidney disease: Secondary | ICD-10-CM | POA: Diagnosis not present

## 2022-11-04 DIAGNOSIS — Z992 Dependence on renal dialysis: Secondary | ICD-10-CM | POA: Diagnosis not present

## 2022-11-05 DIAGNOSIS — N186 End stage renal disease: Secondary | ICD-10-CM | POA: Diagnosis not present

## 2022-11-05 DIAGNOSIS — N2581 Secondary hyperparathyroidism of renal origin: Secondary | ICD-10-CM | POA: Diagnosis not present

## 2022-11-05 DIAGNOSIS — D631 Anemia in chronic kidney disease: Secondary | ICD-10-CM | POA: Diagnosis not present

## 2022-11-05 DIAGNOSIS — Z992 Dependence on renal dialysis: Secondary | ICD-10-CM | POA: Diagnosis not present

## 2022-11-08 DIAGNOSIS — Z992 Dependence on renal dialysis: Secondary | ICD-10-CM | POA: Diagnosis not present

## 2022-11-08 DIAGNOSIS — D631 Anemia in chronic kidney disease: Secondary | ICD-10-CM | POA: Diagnosis not present

## 2022-11-08 DIAGNOSIS — N186 End stage renal disease: Secondary | ICD-10-CM | POA: Diagnosis not present

## 2022-11-08 DIAGNOSIS — N2581 Secondary hyperparathyroidism of renal origin: Secondary | ICD-10-CM | POA: Diagnosis not present

## 2022-11-09 ENCOUNTER — Encounter: Payer: Self-pay | Admitting: Nurse Practitioner

## 2022-11-09 ENCOUNTER — Ambulatory Visit (INDEPENDENT_AMBULATORY_CARE_PROVIDER_SITE_OTHER): Payer: Medicare Other | Admitting: Nurse Practitioner

## 2022-11-09 VITALS — BP 150/75 | HR 57 | Ht 72.0 in | Wt 221.1 lb

## 2022-11-09 DIAGNOSIS — E1142 Type 2 diabetes mellitus with diabetic polyneuropathy: Secondary | ICD-10-CM

## 2022-11-09 DIAGNOSIS — Z794 Long term (current) use of insulin: Secondary | ICD-10-CM | POA: Diagnosis not present

## 2022-11-09 DIAGNOSIS — I152 Hypertension secondary to endocrine disorders: Secondary | ICD-10-CM

## 2022-11-09 DIAGNOSIS — Z992 Dependence on renal dialysis: Secondary | ICD-10-CM

## 2022-11-09 DIAGNOSIS — N186 End stage renal disease: Secondary | ICD-10-CM

## 2022-11-09 DIAGNOSIS — E1159 Type 2 diabetes mellitus with other circulatory complications: Secondary | ICD-10-CM | POA: Diagnosis not present

## 2022-11-09 LAB — POCT GLYCOSYLATED HEMOGLOBIN (HGB A1C): HbA1c POC (<> result, manual entry): 5 % (ref 4.0–5.6)

## 2022-11-09 MED ORDER — RENA-VITE PO TABS: 1.0000 | ORAL_TABLET | Freq: Every day | ORAL | 3 refills | Status: DC

## 2022-11-09 MED ORDER — FOLIC ACID 1 MG PO TABS: 1.0000 mg | ORAL_TABLET | Freq: Every day | ORAL | 3 refills | Status: DC

## 2022-11-09 NOTE — Progress Notes (Signed)
Established patient visit   Patient: Brandon [name]   DOB: 05-05-1961   61 y.o. Male  MRN: [id number] Visit Date: 11/09/2022   Chief Complaint  Patient presents with   Follow-up   Diabetes   Subjective    HPI  Follow up  -type 2 diabetes  -HgbA1c 5.0 today  -hospitalized back in October 2023.  --acute on chronic kidney injury. Now on hemodialysis.  ---does have history of renal cell carcinoma -blood pressure slightly elevated but improved from recent hospitalization.  -states that he generally feels fatigued.  -feels down since starting on dialysis  .He denies chest pain, chest pressure, or shortness of breath. He denies headaches or visual disturbances. He denies abdominal pain, nausea, vomiting, or changes in bowel or bladder habits.      Medications: Outpatient Medications Prior to Visit  Medication Sig   amLODipine (NORVASC) 10 MG tablet Take 1 tablet (10 mg total) by mouth daily.   atorvastatin (LIPITOR) 40 MG tablet Take 1 tablet (40 mg total) by mouth daily.   blood glucose meter kit and supplies Dispense based on patient and insurance preference. Use up to four times daily as directed. (FOR ICD-9 250.00, 250.01). (Patient taking differently: 1 each by Other route See admin instructions. Dispense based on patient and insurance preference. Use up to four times daily as directed. (FOR ICD-9 250.00, 250.01).)   carvedilol (COREG) 25 MG tablet Take 25 mg by mouth 2 (two) times daily.   ezetimibe (ZETIA) 10 MG tablet Take 1 tablet (10 mg total) by mouth daily.   ferrous sulfate 325 (65 FE) MG tablet Take 1 tablet (325 mg total) by mouth daily with breakfast.   glucose blood (ONETOUCH ULTRA) test strip Blood sugar testing done TID for insulin dependant Type @ diabetes - E11.65   HYDROcodone-acetaminophen (NORCO) 5-325 MG tablet Take 1 tablet by mouth every 6 (six) hours as needed for moderate pain.   isosorbide mononitrate (IMDUR) 30 MG 24 hr tablet Take 1 tablet (30 mg  total) by mouth daily.   Lancets (ACCU-CHEK SOFT TOUCH) lancets Use as instructed (Patient taking differently: 1 each by Other route as directed.)   levETIRAcetam (KEPPRA) 500 MG tablet Take 1 tablet (500 mg total) by mouth 2 (two) times daily.   pantoprazole (PROTONIX) 40 MG tablet Take 1 tablet (40 mg total) by mouth daily.   [DISCONTINUED] folic acid (FOLVITE) 1 MG tablet Take 1 tablet (1 mg total) by mouth daily.   [DISCONTINUED] multivitamin (RENA-VIT) TABS tablet Take 1 tablet by mouth at bedtime.   [DISCONTINUED] sertraline (ZOLOFT) 100 MG tablet Take 1 tablet (100 mg total) by mouth daily.   [DISCONTINUED] Insulin Pen Needle (NOVOFINE PLUS PEN NEEDLE) 32G X 4 MM MISC Use as directed with insulin pen   No facility-administered medications prior to visit.    Review of Systems  Constitutional:  Positive for fatigue. Negative for activity change, chills and fever.  HENT:  Negative for congestion, postnasal drip, rhinorrhea, sinus pressure, sinus pain, sneezing and sore throat.   Eyes: Negative.   Respiratory:  Negative for cough, shortness of breath and wheezing.   Cardiovascular:  Positive for leg swelling. Negative for chest pain and palpitations.  Gastrointestinal:  Negative for constipation, diarrhea, nausea and vomiting.  Endocrine: Negative for cold intolerance, heat intolerance, polydipsia and polyuria.       Blood sugars doing well with HgbA1c 5.0 today   Genitourinary:  Negative for dysuria, frequency and urgency.  Musculoskeletal:  Negative for back pain  and myalgias.  Skin:  Negative for rash.  Allergic/Immunologic: Negative for environmental allergies.  Neurological:  Negative for dizziness, weakness and headaches.  Psychiatric/Behavioral:  Positive for dysphoric mood. The patient is nervous/anxious.     Last CBC Lab Results  Component Value Date   WBC 4.7 10/15/2022   HGB 7.8 (L) 10/15/2022   HCT 22.9 (L) 10/15/2022   MCV 87.7 10/15/2022   MCH 29.9 10/15/2022    RDW 14.3 10/15/2022   PLT 202 82/42/3536   Last metabolic panel Lab Results  Component Value Date   GLUCOSE 140 (H) 10/16/2022   NA 137 10/16/2022   K 3.7 10/16/2022   CL 99 10/16/2022   CO2 30 10/16/2022   BUN 8 10/16/2022   CREATININE 4.98 (H) 10/16/2022   GFRNONAA 13 (L) 10/16/2022   CALCIUM 7.8 (L) 10/16/2022   PHOS 2.7 10/16/2022   PROT 7.7 10/02/2022   ALBUMIN 2.5 (L) 10/16/2022   LABGLOB 3.0 04/19/2022   AGRATIO 1.3 04/19/2022   BILITOT 1.5 (H) 10/02/2022   ALKPHOS 71 10/02/2022   AST 9 (L) 10/02/2022   ALT 9 10/02/2022   ANIONGAP 8 10/16/2022   Last lipids Lab Results  Component Value Date   CHOL 144 04/19/2022   HDL 46 04/19/2022   LDLCALC 91 04/19/2022   TRIG 28 04/19/2022   CHOLHDL 3.1 04/19/2022   Last hemoglobin A1c Lab Results  Component Value Date   HGBA1C 5.0 11/09/2022   Last thyroid functions Lab Results  Component Value Date   TSH 3.044 10/03/2022   Last vitamin D No results found for: "25OHVITD2", "25OHVITD3", "VD25OH"     Objective     Today's Vitals   11/09/22 1618 11/09/22 1703  BP: (Abnormal) 147/73 (Abnormal) 150/75  Pulse: (Abnormal) 57   SpO2: 99%   Weight: 221 lb 1.9 oz (100.3 kg)   Height: 6' (1.829 m)    Body mass index is 29.99 kg/m.  BP Readings from Last 3 Encounters:  01/02/23 (Abnormal) 187/96  11/09/22 (Abnormal) 150/75  10/16/22 (Abnormal) 152/78    Wt Readings from Last 3 Encounters:  01/02/23 222 lb 3.2 oz (100.8 kg)  11/09/22 221 lb 1.9 oz (100.3 kg)  10/16/22 231 lb 9.6 oz (105.1 kg)    Physical Exam Vitals and nursing note reviewed.  Constitutional:      Appearance: Normal appearance. He is well-developed.  HENT:     Head: Normocephalic and atraumatic.  Eyes:     Pupils: Pupils are equal, round, and reactive to light.  Cardiovascular:     Rate and Rhythm: Normal rate and regular rhythm.     Pulses: Normal pulses.     Heart sounds: Normal heart sounds.  Pulmonary:     Effort: Pulmonary  effort is normal.     Breath sounds: Normal breath sounds.  Abdominal:     Palpations: Abdomen is soft.  Musculoskeletal:        General: Normal range of motion.     Cervical back: Normal range of motion and neck supple.  Lymphadenopathy:     Cervical: No cervical adenopathy.  Skin:    General: Skin is warm and dry.     Capillary Refill: Capillary refill takes less than 2 seconds.  Neurological:     General: No focal deficit present.     Mental Status: He is alert and oriented to person, place, and time. Mental status is at baseline.  Psychiatric:        Mood and Affect: Mood normal.  Behavior: Behavior normal.        Thought Content: Thought content normal.        Judgment: Judgment normal.      Results for orders placed or performed in visit on 11/09/22  POCT glycosylated hemoglobin (Hb A1C)  Result Value Ref Range   Hemoglobin A1C     HbA1c POC (<> result, manual entry) 5.0 4.0 - 5.6 %   HbA1c, POC (prediabetic range)     HbA1c, POC (controlled diabetic range)      Assessment & Plan    1. Type 2 diabetes mellitus with diabetic polyneuropathy, with long-term current use of insulin (HCC) HgbA1c 5.0 today. Continue all diabetic medication as prescribed  end stage renal disease. Refer to social work for further evaluation and management.  - POCT glycosylated hemoglobin (Hb A1C) - Ambulatory referral to Social Work  2. Hypertension associated with diabetes ([place]) Bp stable. Continue bp medication as prescribed.   3. End stage renal disease ([place]) Continue with folic acid and rena-vit as prescribed. Now on hemodialysis. Refer to social work for [employer].  - folic acid (FOLVITE) 1 MG tablet; Take 1 tablet (1 mg total) by mouth daily.  Dispense: 90 tablet; Refill: 3 - multivitamin (RENA-VIT) TABS tablet; Take 1 tablet by mouth at bedtime.  Dispense: 90 tablet; Refill: 3 - Ambulatory referral to Social Work  4. Hemodialysis status ([place]) Every  other  day hemodialysis. Refer to hemodialysis for further  evaluation.  - Ambulatory referral to Social Work    Problem List Items Addressed This Visit       Cardiovascular and Mediastinum   Hypertension associated with diabetes ([place]) (Chronic)   Relevant Orders   POCT glycosylated hemoglobin (Hb A1C) (Completed)   Ambulatory referral to Social Work   AMB Referral to [facility] (ACO Patients)     Endocrine   Type 2 diabetes mellitus with diabetic polyneuropathy, with long-term current use of insulin (HCC) - Primary   Relevant Orders   POCT glycosylated hemoglobin (Hb A1C) (Completed)   Ambulatory referral to Social Work   AMB Referral to [facility] (ACO Patients)     Genitourinary   End stage renal disease ([place])   Relevant Medications   folic acid (FOLVITE) 1 MG tablet   multivitamin (RENA-VIT) TABS tablet   Other Relevant Orders   Ambulatory referral to Social Work   AMB Referral to [place] (ACO Patients)     Other   Hemodialysis status ([place])   Relevant Orders   Ambulatory referral to Social Work   AMB Referral to [place] (ACO Patients)     Return in about 3 months (around 02/08/2023) for diabetes with HgbA1c check.         Ronnell Freshwater, NP  [facility] at [facility] [phone] (phone) [phone] (fax)  [place]

## 2022-11-10 DIAGNOSIS — Z992 Dependence on renal dialysis: Secondary | ICD-10-CM | POA: Diagnosis not present

## 2022-11-10 DIAGNOSIS — N186 End stage renal disease: Secondary | ICD-10-CM | POA: Diagnosis not present

## 2022-11-10 DIAGNOSIS — N2581 Secondary hyperparathyroidism of renal origin: Secondary | ICD-10-CM | POA: Diagnosis not present

## 2022-11-10 DIAGNOSIS — D631 Anemia in chronic kidney disease: Secondary | ICD-10-CM | POA: Diagnosis not present

## 2022-11-12 DIAGNOSIS — N2581 Secondary hyperparathyroidism of renal origin: Secondary | ICD-10-CM | POA: Diagnosis not present

## 2022-11-12 DIAGNOSIS — Z992 Dependence on renal dialysis: Secondary | ICD-10-CM | POA: Diagnosis not present

## 2022-11-12 DIAGNOSIS — N186 End stage renal disease: Secondary | ICD-10-CM | POA: Diagnosis not present

## 2022-11-12 DIAGNOSIS — D631 Anemia in chronic kidney disease: Secondary | ICD-10-CM | POA: Diagnosis not present

## 2022-11-13 IMAGING — DX DG FOOT COMPLETE 3+V*R*
3 series · 3 of 3 positions shown · non-contrast
Comparison: Right great toe radiographs 10/22/2021

CLINICAL DATA: Foot injury after hitting it against phenomenon
object a few days ago, skin abrasion

EXAM:
RIGHT FOOT COMPLETE - 3+ VIEW

[foot supine dp]
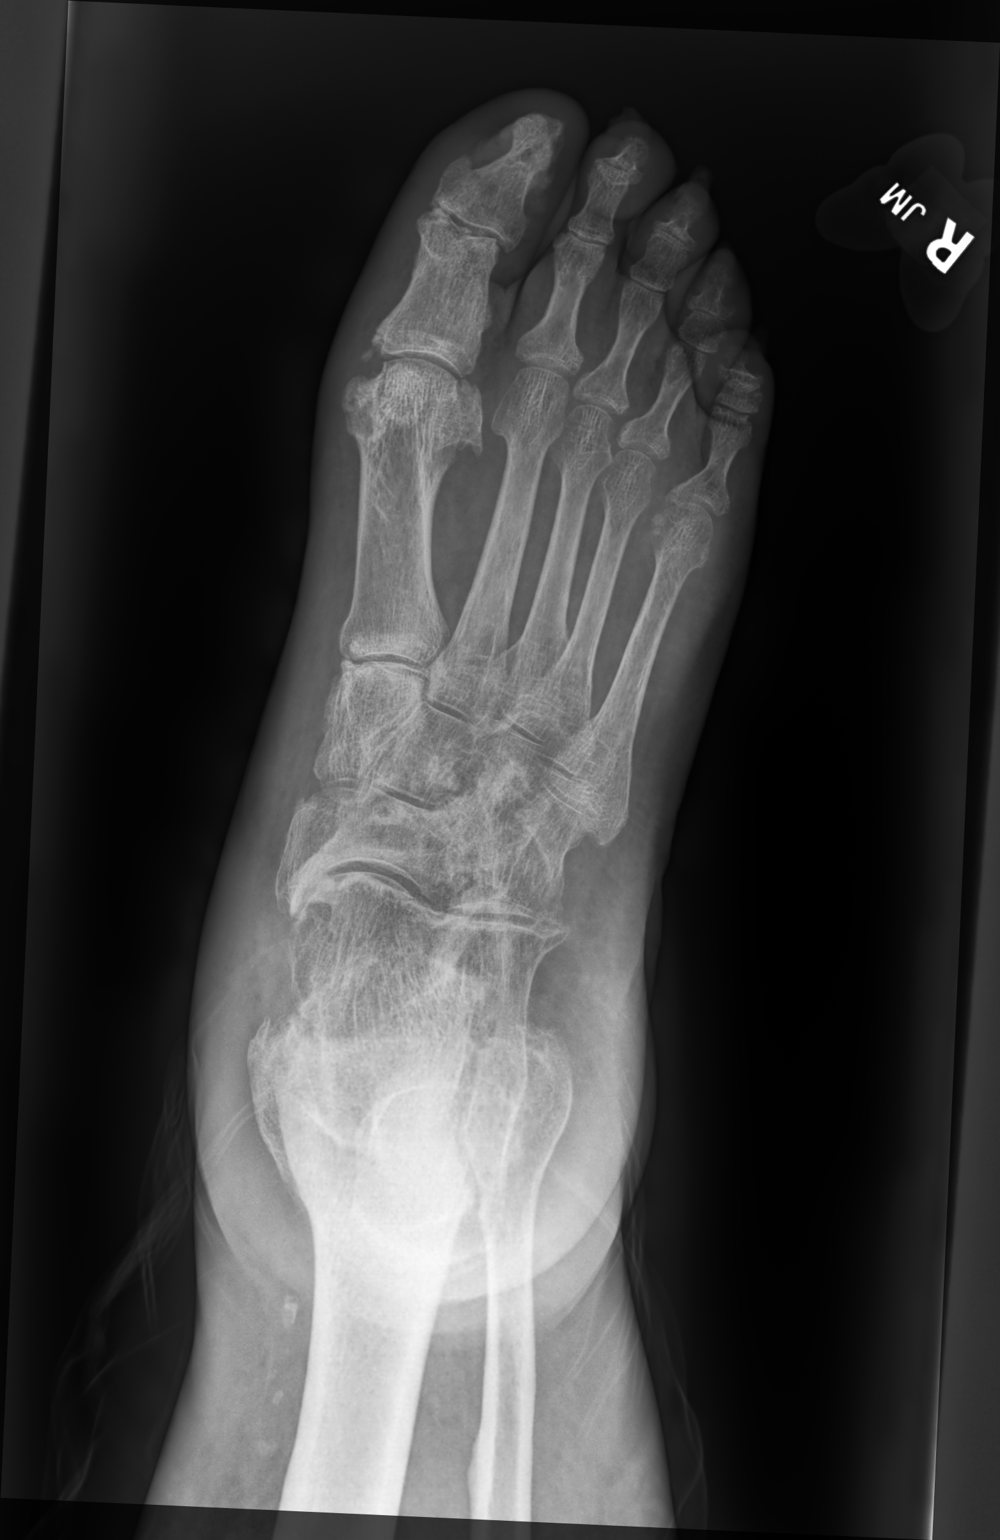

[foot medial oblique]
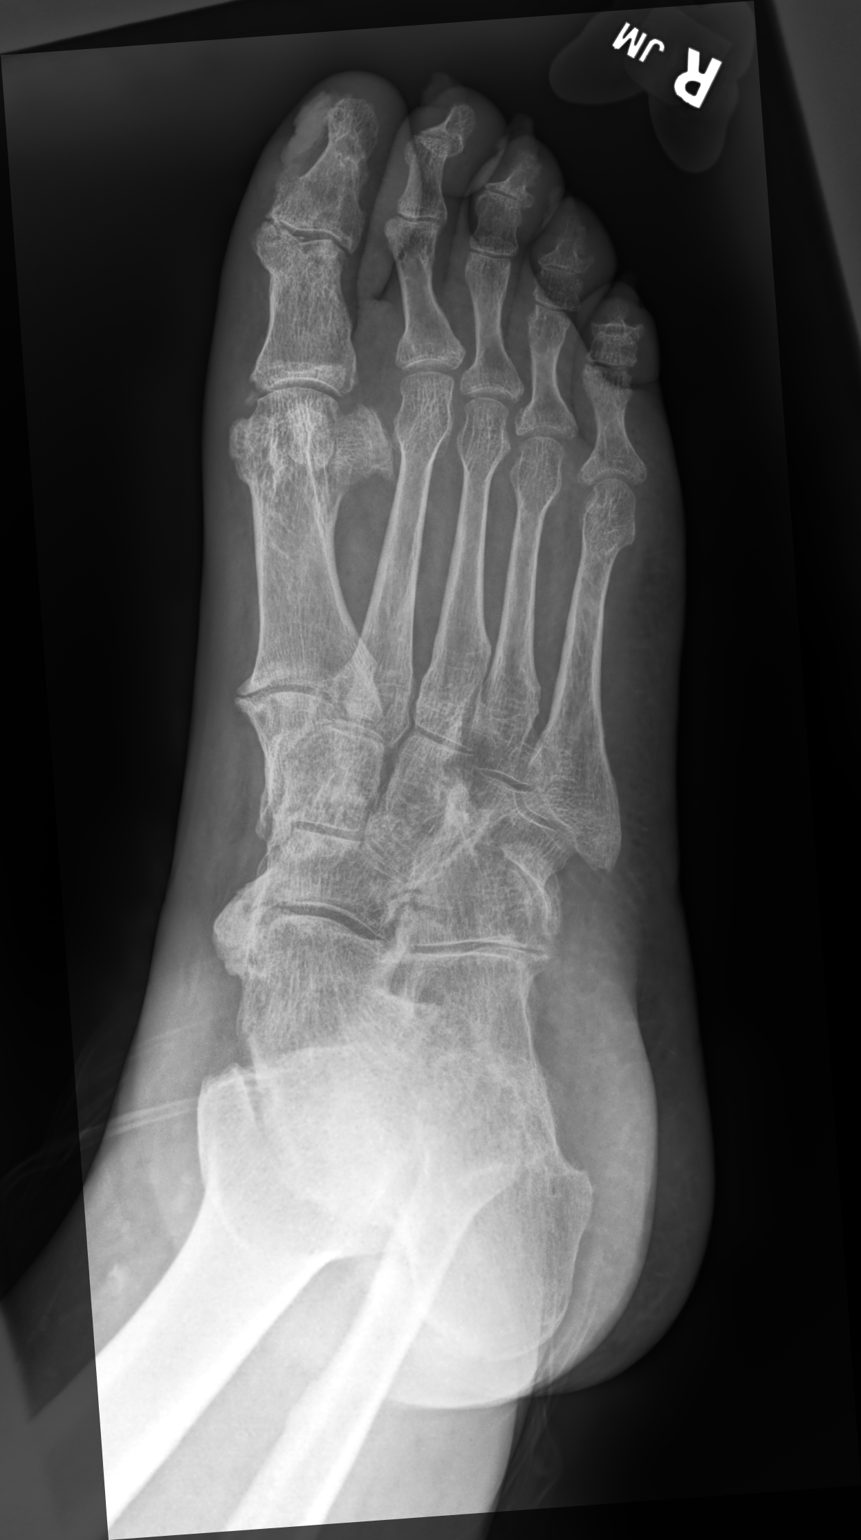

[foot supine lat]
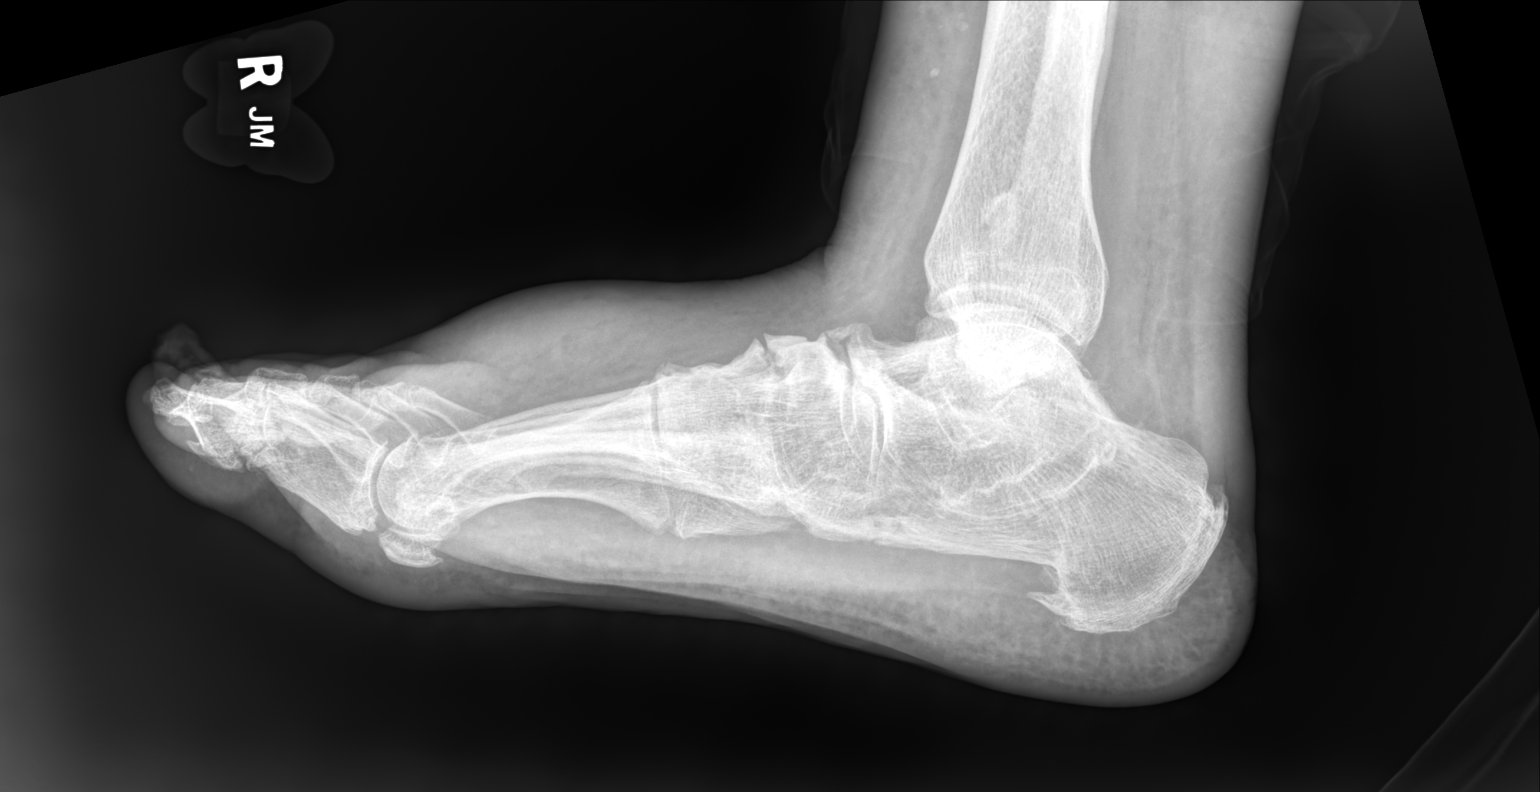

[3 of 3 positions shown; findings below may reference images not displayed]

FINDINGS: There is no evidence of acute fracture or dislocation. Bony
alignment is normal. The Lisfranc and Chopart joints are intact.
There is moderate degenerative change at the great toe MTP and TMT
joints and about the midfoot. No erosive change is seen. There is
inferior calcaneal spurring and Achilles enthesopathy. There is
marked soft tissue swelling over the dorsum of the foot. There is no
soft tissue gas or radiopaque foreign body.
IMPRESSION: 1. No evidence of acute fracture or dislocation.
2. Marked soft tissue swelling over the dorsum of the foot. No soft
tissue gas or radiopaque foreign body.
3. Moderate degenerative changes at the great toe MTP and TMT joints
and about the midfoot.

## 2022-11-15 DIAGNOSIS — N186 End stage renal disease: Secondary | ICD-10-CM | POA: Diagnosis not present

## 2022-11-15 DIAGNOSIS — D631 Anemia in chronic kidney disease: Secondary | ICD-10-CM | POA: Diagnosis not present

## 2022-11-15 DIAGNOSIS — Z992 Dependence on renal dialysis: Secondary | ICD-10-CM | POA: Diagnosis not present

## 2022-11-15 DIAGNOSIS — N2581 Secondary hyperparathyroidism of renal origin: Secondary | ICD-10-CM | POA: Diagnosis not present

## 2022-11-17 DIAGNOSIS — N2581 Secondary hyperparathyroidism of renal origin: Secondary | ICD-10-CM | POA: Diagnosis not present

## 2022-11-17 DIAGNOSIS — N186 End stage renal disease: Secondary | ICD-10-CM | POA: Diagnosis not present

## 2022-11-17 DIAGNOSIS — D631 Anemia in chronic kidney disease: Secondary | ICD-10-CM | POA: Diagnosis not present

## 2022-11-17 DIAGNOSIS — Z992 Dependence on renal dialysis: Secondary | ICD-10-CM | POA: Diagnosis not present

## 2022-11-19 DIAGNOSIS — N186 End stage renal disease: Secondary | ICD-10-CM | POA: Diagnosis not present

## 2022-11-19 DIAGNOSIS — N2581 Secondary hyperparathyroidism of renal origin: Secondary | ICD-10-CM | POA: Diagnosis not present

## 2022-11-19 DIAGNOSIS — Z992 Dependence on renal dialysis: Secondary | ICD-10-CM | POA: Diagnosis not present

## 2022-11-19 DIAGNOSIS — D631 Anemia in chronic kidney disease: Secondary | ICD-10-CM | POA: Diagnosis not present

## 2022-11-22 DIAGNOSIS — D631 Anemia in chronic kidney disease: Secondary | ICD-10-CM | POA: Diagnosis not present

## 2022-11-22 DIAGNOSIS — N186 End stage renal disease: Secondary | ICD-10-CM | POA: Diagnosis not present

## 2022-11-22 DIAGNOSIS — Z992 Dependence on renal dialysis: Secondary | ICD-10-CM | POA: Diagnosis not present

## 2022-11-22 DIAGNOSIS — N2581 Secondary hyperparathyroidism of renal origin: Secondary | ICD-10-CM | POA: Diagnosis not present

## 2022-11-24 DIAGNOSIS — N186 End stage renal disease: Secondary | ICD-10-CM | POA: Diagnosis not present

## 2022-11-24 DIAGNOSIS — N2581 Secondary hyperparathyroidism of renal origin: Secondary | ICD-10-CM | POA: Diagnosis not present

## 2022-11-24 DIAGNOSIS — D631 Anemia in chronic kidney disease: Secondary | ICD-10-CM | POA: Diagnosis not present

## 2022-11-24 DIAGNOSIS — Z992 Dependence on renal dialysis: Secondary | ICD-10-CM | POA: Diagnosis not present

## 2022-11-26 DIAGNOSIS — D631 Anemia in chronic kidney disease: Secondary | ICD-10-CM | POA: Diagnosis not present

## 2022-11-26 DIAGNOSIS — N2581 Secondary hyperparathyroidism of renal origin: Secondary | ICD-10-CM | POA: Diagnosis not present

## 2022-11-26 DIAGNOSIS — N186 End stage renal disease: Secondary | ICD-10-CM | POA: Diagnosis not present

## 2022-11-26 DIAGNOSIS — Z992 Dependence on renal dialysis: Secondary | ICD-10-CM | POA: Diagnosis not present

## 2022-11-27 ENCOUNTER — Other Ambulatory Visit: Payer: Self-pay | Admitting: Nurse Practitioner

## 2022-11-27 DIAGNOSIS — F339 Major depressive disorder, recurrent, unspecified: Secondary | ICD-10-CM

## 2022-11-29 ENCOUNTER — Encounter (HOSPITAL_COMMUNITY): Payer: Self-pay

## 2022-11-29 DIAGNOSIS — N186 End stage renal disease: Secondary | ICD-10-CM | POA: Diagnosis not present

## 2022-11-29 DIAGNOSIS — Z992 Dependence on renal dialysis: Secondary | ICD-10-CM | POA: Diagnosis not present

## 2022-11-29 DIAGNOSIS — N2581 Secondary hyperparathyroidism of renal origin: Secondary | ICD-10-CM | POA: Diagnosis not present

## 2022-11-29 DIAGNOSIS — D631 Anemia in chronic kidney disease: Secondary | ICD-10-CM | POA: Diagnosis not present

## 2022-11-30 ENCOUNTER — Telehealth: Payer: Self-pay | Admitting: *Deleted

## 2022-11-30 NOTE — Progress Notes (Unsigned)
  Care Coordination  Outreach Note  11/30/2022 Name: Brandon [name] MRN: [id number] DOB: 1961-10-10   Care Coordination Outreach Attempts: An unsuccessful telephone outreach was attempted today to offer the patient information about available care coordination services as a benefit of their health plan.   Follow Up Plan:  Additional outreach attempts will be made to offer the patient care coordination information and services.   Encounter Outcome:  No Answer  [place]  Direct Dial: [phone]

## 2022-12-01 NOTE — Progress Notes (Signed)
  Care Coordination   Note   12/01/2022 Name: Brandon [name] MRN: [id number] DOB: 02/21/1961  Brandon [name] is a 61 y.o. year old male who sees Boscia, Greer Ee, NP for primary care. I reached out to Teodoro Kil by phone today to offer care coordination services.  Mr. Caratachea was given information about Care Coordination services today including:   The Care Coordination services include support from the care team which includes your Nurse Coordinator, Clinical Social Worker, or Pharmacist.  The Care Coordination team is here to help remove barriers to the health concerns and goals most important to you. Care Coordination services are voluntary, and the patient may decline or stop services at any time by request to their care team member.   Care Coordination Consent Status: Patient agreed to services and verbal consent obtained.   Follow up plan:  Telephone appointment with care coordination team member scheduled for:  12/09/22  Encounter Outcome:  Pt. Scheduled  [place]  Direct Dial: [phone]

## 2022-12-03 DIAGNOSIS — N2581 Secondary hyperparathyroidism of renal origin: Secondary | ICD-10-CM | POA: Diagnosis not present

## 2022-12-03 DIAGNOSIS — N186 End stage renal disease: Secondary | ICD-10-CM | POA: Diagnosis not present

## 2022-12-03 DIAGNOSIS — D631 Anemia in chronic kidney disease: Secondary | ICD-10-CM | POA: Diagnosis not present

## 2022-12-03 DIAGNOSIS — Z992 Dependence on renal dialysis: Secondary | ICD-10-CM | POA: Diagnosis not present

## 2022-12-05 DIAGNOSIS — Z992 Dependence on renal dialysis: Secondary | ICD-10-CM | POA: Diagnosis not present

## 2022-12-05 DIAGNOSIS — I129 Hypertensive chronic kidney disease with stage 1 through stage 4 chronic kidney disease, or unspecified chronic kidney disease: Secondary | ICD-10-CM | POA: Diagnosis not present

## 2022-12-05 DIAGNOSIS — N186 End stage renal disease: Secondary | ICD-10-CM | POA: Diagnosis not present

## 2022-12-06 DIAGNOSIS — Z992 Dependence on renal dialysis: Secondary | ICD-10-CM | POA: Diagnosis not present

## 2022-12-06 DIAGNOSIS — N186 End stage renal disease: Secondary | ICD-10-CM | POA: Diagnosis not present

## 2022-12-06 DIAGNOSIS — D631 Anemia in chronic kidney disease: Secondary | ICD-10-CM | POA: Diagnosis not present

## 2022-12-06 DIAGNOSIS — E875 Hyperkalemia: Secondary | ICD-10-CM | POA: Diagnosis not present

## 2022-12-06 DIAGNOSIS — N2581 Secondary hyperparathyroidism of renal origin: Secondary | ICD-10-CM | POA: Diagnosis not present

## 2022-12-08 DIAGNOSIS — Z992 Dependence on renal dialysis: Secondary | ICD-10-CM | POA: Diagnosis not present

## 2022-12-08 DIAGNOSIS — N2581 Secondary hyperparathyroidism of renal origin: Secondary | ICD-10-CM | POA: Diagnosis not present

## 2022-12-08 DIAGNOSIS — E875 Hyperkalemia: Secondary | ICD-10-CM | POA: Diagnosis not present

## 2022-12-08 DIAGNOSIS — D631 Anemia in chronic kidney disease: Secondary | ICD-10-CM | POA: Diagnosis not present

## 2022-12-08 DIAGNOSIS — N186 End stage renal disease: Secondary | ICD-10-CM | POA: Diagnosis not present

## 2022-12-09 ENCOUNTER — Telehealth: Payer: Self-pay | Admitting: *Deleted

## 2022-12-09 ENCOUNTER — Encounter: Payer: Self-pay | Admitting: *Deleted

## 2022-12-09 NOTE — Patient Outreach (Signed)
  Care Coordination   12/09/2022 Name: BENNIE CHIRICO MRN: [id number] DOB: 05-26-1961   Care Coordination Outreach Attempts:  An unsuccessful telephone outreach was attempted today to offer the patient information about available care coordination services as a benefit of their health plan.   Follow Up Plan:  Additional outreach attempts will be made to offer the patient care coordination information and services.   Encounter Outcome:  No Answer   Care Coordination Interventions:  No, not indicated    Eduard Clos MSW, LCSW Licensed Clinical Social Worker    [phone]

## 2022-12-13 ENCOUNTER — Telehealth: Payer: Self-pay | Admitting: *Deleted

## 2022-12-13 NOTE — Progress Notes (Unsigned)
  Care Coordination Note  12/13/2022 Name: Brandon [name] MRN: [id number] DOB: [birth year]  Brandon [name] is a 62 y.o. year old male who is a primary care patient of Ronnell Freshwater, NP and is actively engaged with the care management team. I reached out to Teodoro Kil by phone today to assist with re-scheduling an initial visit with the Licensed Clinical Social Worker  Follow up plan: Unsuccessful telephone outreach attempt made. A HIPAA compliant phone message was left for the patient providing contact information and requesting a return call.   [place]  Direct Dial: [phone]

## 2022-12-14 NOTE — Progress Notes (Signed)
  Care Coordination Note  12/14/2022 Name: Brandon [name] MRN: [id number] DOB: November 11, 1961  Brandon [name] is a 62 y.o. year old male who is a primary care patient of Ronnell Freshwater, NP and is actively engaged with the care management team. I reached out to Teodoro Kil by phone today to assist with re-scheduling an initial visit with the Licensed Clinical Social Worker  Follow up plan: Unsuccessful telephone outreach attempt made. A HIPAA compliant phone message was left for the patient providing contact information and requesting a return call.  We have been unable to make contact with the patient for follow up. The care management team is available to follow up with the patient after provider conversation with the patient regarding recommendation for care management engagement and subsequent re-referral to the care management team.   [place]  Direct Dial: [phone]

## 2022-12-15 DIAGNOSIS — Z992 Dependence on renal dialysis: Secondary | ICD-10-CM | POA: Diagnosis not present

## 2022-12-15 DIAGNOSIS — N186 End stage renal disease: Secondary | ICD-10-CM | POA: Diagnosis not present

## 2022-12-15 DIAGNOSIS — E1122 Type 2 diabetes mellitus with diabetic chronic kidney disease: Secondary | ICD-10-CM | POA: Diagnosis not present

## 2022-12-15 DIAGNOSIS — D631 Anemia in chronic kidney disease: Secondary | ICD-10-CM | POA: Diagnosis not present

## 2022-12-15 DIAGNOSIS — N2581 Secondary hyperparathyroidism of renal origin: Secondary | ICD-10-CM | POA: Diagnosis not present

## 2022-12-15 DIAGNOSIS — E875 Hyperkalemia: Secondary | ICD-10-CM | POA: Diagnosis not present

## 2022-12-17 DIAGNOSIS — N2581 Secondary hyperparathyroidism of renal origin: Secondary | ICD-10-CM | POA: Diagnosis not present

## 2022-12-17 DIAGNOSIS — Z992 Dependence on renal dialysis: Secondary | ICD-10-CM | POA: Diagnosis not present

## 2022-12-17 DIAGNOSIS — E875 Hyperkalemia: Secondary | ICD-10-CM | POA: Diagnosis not present

## 2022-12-17 DIAGNOSIS — N186 End stage renal disease: Secondary | ICD-10-CM | POA: Diagnosis not present

## 2022-12-17 DIAGNOSIS — D631 Anemia in chronic kidney disease: Secondary | ICD-10-CM | POA: Diagnosis not present

## 2022-12-20 DIAGNOSIS — N2581 Secondary hyperparathyroidism of renal origin: Secondary | ICD-10-CM | POA: Diagnosis not present

## 2022-12-20 DIAGNOSIS — E875 Hyperkalemia: Secondary | ICD-10-CM | POA: Diagnosis not present

## 2022-12-20 DIAGNOSIS — D631 Anemia in chronic kidney disease: Secondary | ICD-10-CM | POA: Diagnosis not present

## 2022-12-20 DIAGNOSIS — Z992 Dependence on renal dialysis: Secondary | ICD-10-CM | POA: Diagnosis not present

## 2022-12-20 DIAGNOSIS — N186 End stage renal disease: Secondary | ICD-10-CM | POA: Diagnosis not present

## 2022-12-22 DIAGNOSIS — E875 Hyperkalemia: Secondary | ICD-10-CM | POA: Diagnosis not present

## 2022-12-22 DIAGNOSIS — N2581 Secondary hyperparathyroidism of renal origin: Secondary | ICD-10-CM | POA: Diagnosis not present

## 2022-12-22 DIAGNOSIS — D631 Anemia in chronic kidney disease: Secondary | ICD-10-CM | POA: Diagnosis not present

## 2022-12-22 DIAGNOSIS — Z992 Dependence on renal dialysis: Secondary | ICD-10-CM | POA: Diagnosis not present

## 2022-12-22 DIAGNOSIS — N186 End stage renal disease: Secondary | ICD-10-CM | POA: Diagnosis not present

## 2022-12-24 DIAGNOSIS — N186 End stage renal disease: Secondary | ICD-10-CM | POA: Diagnosis not present

## 2022-12-24 DIAGNOSIS — D631 Anemia in chronic kidney disease: Secondary | ICD-10-CM | POA: Diagnosis not present

## 2022-12-24 DIAGNOSIS — E875 Hyperkalemia: Secondary | ICD-10-CM | POA: Diagnosis not present

## 2022-12-24 DIAGNOSIS — Z992 Dependence on renal dialysis: Secondary | ICD-10-CM | POA: Diagnosis not present

## 2022-12-24 DIAGNOSIS — N2581 Secondary hyperparathyroidism of renal origin: Secondary | ICD-10-CM | POA: Diagnosis not present

## 2022-12-27 DIAGNOSIS — D631 Anemia in chronic kidney disease: Secondary | ICD-10-CM | POA: Diagnosis not present

## 2022-12-27 DIAGNOSIS — N2581 Secondary hyperparathyroidism of renal origin: Secondary | ICD-10-CM | POA: Diagnosis not present

## 2022-12-27 DIAGNOSIS — E875 Hyperkalemia: Secondary | ICD-10-CM | POA: Diagnosis not present

## 2022-12-27 DIAGNOSIS — N186 End stage renal disease: Secondary | ICD-10-CM | POA: Diagnosis not present

## 2022-12-27 DIAGNOSIS — Z992 Dependence on renal dialysis: Secondary | ICD-10-CM | POA: Diagnosis not present

## 2022-12-28 ENCOUNTER — Telehealth: Payer: Self-pay | Admitting: Vascular Surgery

## 2022-12-28 NOTE — Telephone Encounter (Signed)
-----  Message from Marty Heck, MD sent at 10/15/2022  8:34 AM EST ----- Can you arrange follow-up in 4 to 6 weeks in the [facility] clinic with left arm fistula duplex?  Left brachiocephalic AV fistula as an inpatient.  Thanks,  Gerald Stabs

## 2022-12-29 ENCOUNTER — Other Ambulatory Visit: Payer: Self-pay | Admitting: *Deleted

## 2022-12-29 DIAGNOSIS — N186 End stage renal disease: Secondary | ICD-10-CM

## 2022-12-29 NOTE — Telephone Encounter (Signed)
Appt has been scheduled.

## 2022-12-30 IMAGING — DX DG CHEST 1V PORT
1 series · 1 of 1 positions shown · non-contrast
Comparison: Chest x-ray March 18, 22.

CLINICAL DATA: Hyperglycemia

EXAM:
PORTABLE CHEST 1 VIEW

[chest]
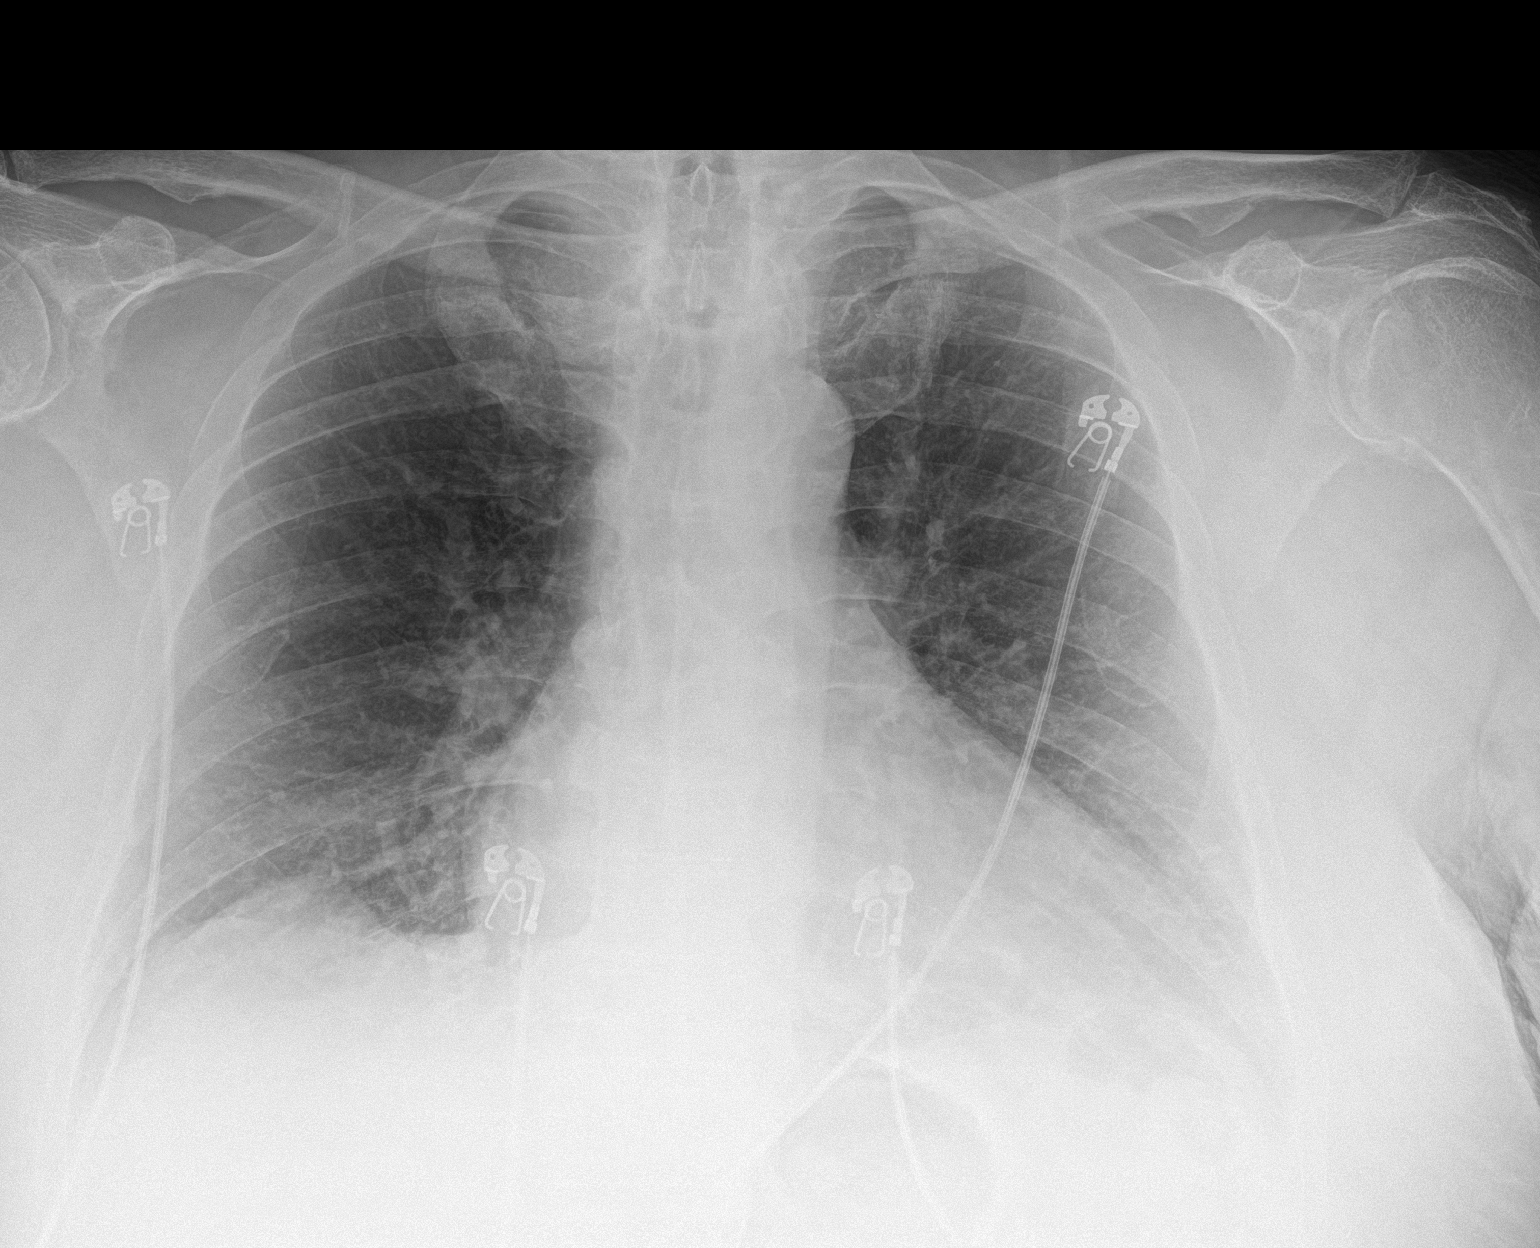

[1 of 1 positions shown; findings below may reference images not displayed]

FINDINGS: No confluent consolidation. Mild streaky bibasilar opacities. No
visible pleural effusions or pneumothorax. Mild enlargement of the
cardiac silhouette, similar. Polyarticular degenerative change.
IMPRESSION: Mild streaky bibasilar opacities, favor atelectasis. No confluent
consolidation.

## 2023-01-02 ENCOUNTER — Ambulatory Visit (HOSPITAL_COMMUNITY)
Admission: RE | Admit: 2023-01-02 | Discharge: 2023-01-02 | Disposition: A | Discharge status: Home / Self Care | Payer: Medicare Other | Source: Ambulatory Visit | Attending: Surgery | Admitting: Surgery

## 2023-01-02 ENCOUNTER — Encounter: Payer: Self-pay | Admitting: Physician Assistant

## 2023-01-02 ENCOUNTER — Ambulatory Visit (INDEPENDENT_AMBULATORY_CARE_PROVIDER_SITE_OTHER): Payer: Medicare Other | Admitting: Physician Assistant

## 2023-01-02 VITALS — BP 187/96 | HR 57 | Temp 98.1°F | Resp 20 | Ht 72.0 in | Wt 222.2 lb

## 2023-01-02 DIAGNOSIS — N186 End stage renal disease: Secondary | ICD-10-CM | POA: Insufficient documentation

## 2023-01-02 NOTE — Progress Notes (Signed)
Postoperative Access Visit   History of Present Illness   Brandon [name] is a 62 y.o. year old male who presents for postoperative follow-up for: Left brachiocephalic AV fistula creation by Dr. Carlis Abbott on 10/14/22. The patient's wounds are well healed.  The patient notes no steal symptoms.  The patient is able to complete their activities of daily living. He reports he does still make some urine. He is hopeful for some renal recovery but discussed importance of going to his dialysis sessions and following up with his Nephrologist.   Dialyzes on TTS via [facility] at the [facility] Location   Physical Examination   Vitals:   01/02/23 0833  BP: (!) 187/96  Pulse: (!) 57  Resp: 20  Temp: 98.1 F (36.7 C)  TempSrc: Temporal  SpO2: 100%  Weight: 222 lb 3.2 oz (100.8 kg)  Height: 6' (1.829 m)   Body mass index is 30.14 kg/m.  left arm Incision is well healed, 2+ radial pulse, hand grip is 5/5, sensation in digits is intact, palpable thrill, bruit can be auscultated    Non invasive vascular lab: Findings:  +--------------------+----------+-----------------+----------------+  AVF                PSV (cm/s)Flow Vol (mL/min)    Comments      +--------------------+----------+-----------------+----------------+  Native artery inflow   225          1311                         +--------------------+----------+-----------------+----------------+  AVF Anastomosis        560                     0.46 cm diameter  +--------------------+----------+-----------------+----------------+     +------------+----------+--------------+----------+--------------+  OUTFLOW VEINPSV (cm/s)Diameter (cm) Depth (cm)   Describe     +------------+----------+--------------+----------+--------------+  Prox UA        149         0.58        0.80   branch 0.48 cm  +------------+----------+--------------+----------+--------------+  Mid UA         342    0.66>0.50>0.82   0.63    branch 0.35 cm  +------------+----------+--------------+----------+--------------+  Dist UA        141         0.73        0.69                   +------------+----------+--------------+----------+--------------+  AC Fossa       192         1.05        0.51                   +------------+----------+--------------+----------+--------------+    Summary:  Patent left BC AVF. Two branches mid and upper arm. Slight narrowing of the mid outflow vein with non hemodynamically significant increase in velocity.    Medical Decision Making   Brandon [name] is a 62 y.o. year old male who presents s/p Left brachiocephalic AV fistula creation by Dr. Carlis Abbott on 10/14/22. Incision in arm has healed well. Duplex shows patent AV fistula with good volume flow. Fistula has matured nicely. On exam fistula has great thrill and is easily palpable in left upper arm.   Patent is without signs or symptoms of steal syndrome The patient's access will be ready for use after 2/60/23 The patient's tunneled dialysis catheter can  be removed when Nephrology is comfortable with the performance of the left brachiocephalic AV fistula The patient may follow up on a prn basis   Karoline Caldwell, PA-C [facility] Office: [facility] MD: Trula Slade

## 2023-01-03 DIAGNOSIS — E875 Hyperkalemia: Secondary | ICD-10-CM | POA: Diagnosis not present

## 2023-01-03 DIAGNOSIS — N2581 Secondary hyperparathyroidism of renal origin: Secondary | ICD-10-CM | POA: Diagnosis not present

## 2023-01-03 DIAGNOSIS — D631 Anemia in chronic kidney disease: Secondary | ICD-10-CM | POA: Diagnosis not present

## 2023-01-03 DIAGNOSIS — N186 End stage renal disease: Secondary | ICD-10-CM | POA: Diagnosis not present

## 2023-01-03 DIAGNOSIS — Z992 Dependence on renal dialysis: Secondary | ICD-10-CM | POA: Diagnosis not present

## 2023-01-05 DIAGNOSIS — D631 Anemia in chronic kidney disease: Secondary | ICD-10-CM | POA: Diagnosis not present

## 2023-01-05 DIAGNOSIS — N2581 Secondary hyperparathyroidism of renal origin: Secondary | ICD-10-CM | POA: Diagnosis not present

## 2023-01-05 DIAGNOSIS — N186 End stage renal disease: Secondary | ICD-10-CM | POA: Diagnosis not present

## 2023-01-05 DIAGNOSIS — I129 Hypertensive chronic kidney disease with stage 1 through stage 4 chronic kidney disease, or unspecified chronic kidney disease: Secondary | ICD-10-CM | POA: Diagnosis not present

## 2023-01-05 DIAGNOSIS — Z992 Dependence on renal dialysis: Secondary | ICD-10-CM | POA: Diagnosis not present

## 2023-01-07 DIAGNOSIS — D631 Anemia in chronic kidney disease: Secondary | ICD-10-CM | POA: Diagnosis not present

## 2023-01-07 DIAGNOSIS — N2581 Secondary hyperparathyroidism of renal origin: Secondary | ICD-10-CM | POA: Diagnosis not present

## 2023-01-07 DIAGNOSIS — Z992 Dependence on renal dialysis: Secondary | ICD-10-CM | POA: Diagnosis not present

## 2023-01-07 DIAGNOSIS — N186 End stage renal disease: Secondary | ICD-10-CM | POA: Diagnosis not present

## 2023-01-10 DIAGNOSIS — D631 Anemia in chronic kidney disease: Secondary | ICD-10-CM | POA: Diagnosis not present

## 2023-01-10 DIAGNOSIS — N186 End stage renal disease: Secondary | ICD-10-CM | POA: Diagnosis not present

## 2023-01-10 DIAGNOSIS — Z992 Dependence on renal dialysis: Secondary | ICD-10-CM | POA: Diagnosis not present

## 2023-01-10 DIAGNOSIS — N2581 Secondary hyperparathyroidism of renal origin: Secondary | ICD-10-CM | POA: Diagnosis not present

## 2023-01-12 DIAGNOSIS — N2581 Secondary hyperparathyroidism of renal origin: Secondary | ICD-10-CM | POA: Diagnosis not present

## 2023-01-12 DIAGNOSIS — D631 Anemia in chronic kidney disease: Secondary | ICD-10-CM | POA: Diagnosis not present

## 2023-01-12 DIAGNOSIS — N186 End stage renal disease: Secondary | ICD-10-CM | POA: Diagnosis not present

## 2023-01-12 DIAGNOSIS — Z992 Dependence on renal dialysis: Secondary | ICD-10-CM | POA: Diagnosis not present

## 2023-01-14 DIAGNOSIS — Z992 Dependence on renal dialysis: Secondary | ICD-10-CM | POA: Diagnosis not present

## 2023-01-14 DIAGNOSIS — N2581 Secondary hyperparathyroidism of renal origin: Secondary | ICD-10-CM | POA: Diagnosis not present

## 2023-01-14 DIAGNOSIS — D631 Anemia in chronic kidney disease: Secondary | ICD-10-CM | POA: Diagnosis not present

## 2023-01-14 DIAGNOSIS — N186 End stage renal disease: Secondary | ICD-10-CM | POA: Diagnosis not present

## 2023-01-17 DIAGNOSIS — D631 Anemia in chronic kidney disease: Secondary | ICD-10-CM | POA: Diagnosis not present

## 2023-01-17 DIAGNOSIS — N2581 Secondary hyperparathyroidism of renal origin: Secondary | ICD-10-CM | POA: Diagnosis not present

## 2023-01-17 DIAGNOSIS — Z992 Dependence on renal dialysis: Secondary | ICD-10-CM | POA: Diagnosis not present

## 2023-01-17 DIAGNOSIS — N186 End stage renal disease: Secondary | ICD-10-CM | POA: Diagnosis not present

## 2023-01-19 DIAGNOSIS — N186 End stage renal disease: Secondary | ICD-10-CM | POA: Diagnosis not present

## 2023-01-19 DIAGNOSIS — Z992 Dependence on renal dialysis: Secondary | ICD-10-CM | POA: Diagnosis not present

## 2023-01-19 DIAGNOSIS — N2581 Secondary hyperparathyroidism of renal origin: Secondary | ICD-10-CM | POA: Diagnosis not present

## 2023-01-19 DIAGNOSIS — D631 Anemia in chronic kidney disease: Secondary | ICD-10-CM | POA: Diagnosis not present

## 2023-01-21 DIAGNOSIS — N186 End stage renal disease: Secondary | ICD-10-CM | POA: Diagnosis not present

## 2023-01-21 DIAGNOSIS — N2581 Secondary hyperparathyroidism of renal origin: Secondary | ICD-10-CM | POA: Diagnosis not present

## 2023-01-21 DIAGNOSIS — D631 Anemia in chronic kidney disease: Secondary | ICD-10-CM | POA: Diagnosis not present

## 2023-01-21 DIAGNOSIS — Z992 Dependence on renal dialysis: Secondary | ICD-10-CM | POA: Diagnosis not present

## 2023-01-24 ENCOUNTER — Emergency Department (HOSPITAL_COMMUNITY): Payer: Medicare Other

## 2023-01-24 ENCOUNTER — Emergency Department (HOSPITAL_COMMUNITY)
Admission: EM | Admit: 2023-01-24 | Discharge: 2023-01-24 | Disposition: A | Discharge status: Home / Self Care | Payer: Medicare Other | Attending: Emergency Medicine | Admitting: Emergency Medicine

## 2023-01-24 ENCOUNTER — Encounter (HOSPITAL_COMMUNITY): Payer: Self-pay

## 2023-01-24 DIAGNOSIS — I959 Hypotension, unspecified: Secondary | ICD-10-CM | POA: Diagnosis not present

## 2023-01-24 DIAGNOSIS — I4430 Unspecified atrioventricular block: Secondary | ICD-10-CM | POA: Diagnosis not present

## 2023-01-24 DIAGNOSIS — Z992 Dependence on renal dialysis: Secondary | ICD-10-CM | POA: Diagnosis not present

## 2023-01-24 DIAGNOSIS — E119 Type 2 diabetes mellitus without complications: Secondary | ICD-10-CM | POA: Insufficient documentation

## 2023-01-24 DIAGNOSIS — Z85528 Personal history of other malignant neoplasm of kidney: Secondary | ICD-10-CM | POA: Insufficient documentation

## 2023-01-24 DIAGNOSIS — R072 Precordial pain: Secondary | ICD-10-CM | POA: Diagnosis not present

## 2023-01-24 DIAGNOSIS — D631 Anemia in chronic kidney disease: Secondary | ICD-10-CM | POA: Diagnosis not present

## 2023-01-24 DIAGNOSIS — R001 Bradycardia, unspecified: Secondary | ICD-10-CM | POA: Diagnosis not present

## 2023-01-24 DIAGNOSIS — N186 End stage renal disease: Secondary | ICD-10-CM | POA: Diagnosis not present

## 2023-01-24 DIAGNOSIS — Z8673 Personal history of transient ischemic attack (TIA), and cerebral infarction without residual deficits: Secondary | ICD-10-CM | POA: Diagnosis not present

## 2023-01-24 DIAGNOSIS — R079 Chest pain, unspecified: Secondary | ICD-10-CM | POA: Diagnosis not present

## 2023-01-24 DIAGNOSIS — R0789 Other chest pain: Secondary | ICD-10-CM | POA: Diagnosis not present

## 2023-01-24 DIAGNOSIS — N2581 Secondary hyperparathyroidism of renal origin: Secondary | ICD-10-CM | POA: Diagnosis not present

## 2023-01-24 LAB — BASIC METABOLIC PANEL
Anion gap: 13 (ref 5–15)
BUN: 44 mg/dL — ABNORMAL HIGH (ref 6–20)
CO2: 26 mmol/L (ref 22–32)
Calcium: 8.5 mg/dL — ABNORMAL LOW (ref 8.9–10.3)
Chloride: 97 mmol/L — ABNORMAL LOW (ref 98–111)
Creatinine, Ser: 7.49 mg/dL — ABNORMAL HIGH (ref 0.61–1.24)
GFR, Estimated: 8 mL/min — ABNORMAL LOW (ref >60)
Glucose, Bld: 76 mg/dL (ref 70–99)
Potassium: 4.8 mmol/L (ref 3.5–5.1)
Sodium: 136 mmol/L (ref 135–145)

## 2023-01-24 LAB — CBC WITH DIFFERENTIAL/PLATELET
Abs Immature Granulocytes: 0.01 10*3/uL (ref 0.00–0.07)
Basophils Absolute: 0 10*3/uL (ref 0.0–0.1)
Basophils Relative: 1 %
Eosinophils Absolute: 0.1 10*3/uL (ref 0.0–0.5)
Eosinophils Relative: 4 %
HCT: 31.4 % — ABNORMAL LOW (ref 39.0–52.0)
Hemoglobin: 10.5 g/dL — ABNORMAL LOW (ref 13.0–17.0)
Immature Granulocytes: 0 %
Lymphocytes Relative: 42 %
Lymphs Abs: 1.4 10*3/uL (ref 0.7–4.0)
MCH: 29.5 pg (ref 26.0–34.0)
MCHC: 33.4 g/dL (ref 30.0–36.0)
MCV: 88.2 fL (ref 80.0–100.0)
Monocytes Absolute: 0.4 10*3/uL (ref 0.1–1.0)
Monocytes Relative: 13 %
Neutro Abs: 1.3 10*3/uL — ABNORMAL LOW (ref 1.7–7.7)
Neutrophils Relative %: 40 %
Platelets: 148 10*3/uL — ABNORMAL LOW (ref 150–400)
RBC: 3.56 MIL/uL — ABNORMAL LOW (ref 4.22–5.81)
RDW: 13.1 % (ref 11.5–15.5)
WBC: 3.2 10*3/uL — ABNORMAL LOW (ref 4.0–10.5)
nRBC: 0 % (ref 0.0–0.2)

## 2023-01-24 LAB — TROPONIN I (HIGH SENSITIVITY): Troponin I (High Sensitivity): 16 ng/L (ref <18)

## 2023-01-24 NOTE — Discharge Instructions (Addendum)
The initial blood work looks okay.  Your EKG looks okay however we cannot rule out that there is not a problem with your heart.  Will be very important to follow-up with the cardiologist.  You may get pain when you go back to dialysis on Thursday.  If you start noticing pain while at dialysis or even throughout the day with any type of activity or you start feeling short of breath or passout you should come back to the hospital immediately

## 2023-01-24 NOTE — ED Provider Notes (Signed)
[facility] Provider Note   CSN: [id number] Arrival date & time: 01/24/23  1458     History  Chief Complaint  Patient presents with   Chest Pain    Brandon [name] is a 62 y.o. male.  Patient is a 62 year old male with a history of bilateral kidney cancer now on dialysis which started in the last 5 to 6 weeks, diabetes, thyroid disease and prior stroke who is presenting today due to complaint of chest pain.  Patient reports he was fine today and went to his regularly scheduled dialysis.  About 1:00 this afternoon he started having tightness in his chest while he was on the machine.  He reports he started feeling nauseated and just did not feel well when he was having tightness.  It lasted between 30 and 45 minutes and resolved on the ride over here.  He reports they did put oxygen on him but it did not seem to help.  He currently is pain-free.  He denies ever having anything like that before.  He has no known heart problems.  He denies any shortness of breath, recent cough, congestion, fever.  He has had no recent medication changes.  He reports no swelling in his legs and his fluid status has been good.  He has been compliant with dialysis.  The history is provided by the patient and the EMS personnel.  Chest Pain      Home Medications Prior to Admission medications   Medication Sig Start Date End Date Taking? Authorizing Provider  amLODipine (NORVASC) 10 MG tablet Take 1 tablet (10 mg total) by mouth daily. 10/16/22   Thurnell Lose, MD  atorvastatin (LIPITOR) 40 MG tablet Take 1 tablet (40 mg total) by mouth daily. 08/01/22   Ronnell Freshwater, NP  blood glucose meter kit and supplies Dispense based on patient and insurance preference. Use up to four times daily as directed. (FOR ICD-9 250.00, 250.01). Patient taking differently: 1 each by Other route See admin instructions. Dispense based on patient and insurance preference. Use up  to four times daily as directed. (FOR ICD-9 250.00, 250.01). 04/24/16   Eugenie Filler, MD  carvedilol (COREG) 25 MG tablet Take 25 mg by mouth 2 (two) times daily. 09/11/22   [provider]  ezetimibe (ZETIA) 10 MG tablet Take 1 tablet (10 mg total) by mouth daily. 10/19/21 10/03/23  Tysinger, Camelia Eng, PA-C  ferrous sulfate 325 (65 FE) MG tablet Take 1 tablet (325 mg total) by mouth daily with breakfast. 10/16/22   Thurnell Lose, MD  folic acid (FOLVITE) 1 MG tablet Take 1 tablet (1 mg total) by mouth daily. 11/09/22   Ronnell Freshwater, NP  glucose blood (ONETOUCH ULTRA) test strip Blood sugar testing done TID for insulin dependant Type @ diabetes - E11.65 04/12/22   Ronnell Freshwater, NP  HYDROcodone-acetaminophen (NORCO) 5-325 MG tablet Take 1 tablet by mouth every 6 (six) hours as needed for moderate pain. 03/18/22   Horton, Alvin Critchley, DO  isosorbide mononitrate (IMDUR) 30 MG 24 hr tablet Take 1 tablet (30 mg total) by mouth daily. 10/16/22   Thurnell Lose, MD  Lancets (ACCU-CHEK SOFT TOUCH) lancets Use as instructed Patient taking differently: 1 each by Other route as directed. 06/11/19   Tysinger, Camelia Eng, PA-C  levETIRAcetam (KEPPRA) 500 MG tablet Take 1 tablet (500 mg total) by mouth 2 (two) times daily. 10/16/22   Thurnell Lose, MD  multivitamin (RENA-VIT) TABS tablet Take 1 tablet by mouth at bedtime. 11/09/22   Ronnell Freshwater, NP  pantoprazole (PROTONIX) 40 MG tablet Take 1 tablet (40 mg total) by mouth daily. 10/16/22   Thurnell Lose, MD  sertraline (ZOLOFT) 100 MG tablet TAKE 1 TABLET BY MOUTH EVERY DAY 11/29/22   Ronnell Freshwater, NP      Allergies    Hydralazine, Imdur [isosorbide nitrate], and Morphine    Review of Systems   Review of Systems  Cardiovascular:  Positive for chest pain.    Physical Exam Updated Vital Signs BP 130/76   Pulse (!) 54   Temp 97.8 F (36.6 C) (Oral)   Resp 16   SpO2 100%  Physical Exam Vitals and nursing note  reviewed.  Constitutional:      General: He is not in acute distress.    Appearance: Normal appearance. He is well-developed.  HENT:     Head: Normocephalic and atraumatic.  Eyes:     Conjunctiva/sclera: Conjunctivae normal.     Pupils: Pupils are equal, round, and reactive to light.  Cardiovascular:     Rate and Rhythm: Normal rate and regular rhythm.     Heart sounds: No murmur heard. Pulmonary:     Effort: Pulmonary effort is normal. No respiratory distress.     Breath sounds: Normal breath sounds. No wheezing or rales.     Comments: Dialysis catheter present in the right upper chest Abdominal:     General: There is no distension.     Palpations: Abdomen is soft.     Tenderness: There is no abdominal tenderness. There is no guarding or rebound.  Musculoskeletal:        General: No tenderness. Normal range of motion.     Cervical back: Normal range of motion and neck supple.     Right lower leg: No edema.     Left lower leg: No edema.  Skin:    General: Skin is warm and dry.     Findings: No erythema or rash.  Neurological:     Mental Status: He is alert and oriented to person, place, and time.  Psychiatric:        Behavior: Behavior normal.     ED Results / Procedures / Treatments   Labs (all labs ordered are listed, but only abnormal results are displayed) Labs Reviewed  CBC WITH DIFFERENTIAL/PLATELET - Abnormal; Notable for the following components:      Result Value   WBC 3.2 (*)    RBC 3.56 (*)    Hemoglobin 10.5 (*)    HCT 31.4 (*)    Platelets 148 (*)    Neutro Abs 1.3 (*)    All other components within normal limits  BASIC METABOLIC PANEL - Abnormal; Notable for the following components:   Chloride 97 (*)    BUN 44 (*)    Creatinine, Ser 7.49 (*)    Calcium 8.5 (*)    GFR, Estimated 8 (*)    All other components within normal limits  TROPONIN I (HIGH SENSITIVITY)  TROPONIN I (HIGH SENSITIVITY)    EKG EKG Interpretation  Date/Time:  Tuesday  January 24 2023 15:05:30 EST Ventricular Rate:  57 PR Interval:  259 QRS Duration: 97 QT Interval:  463 QTC Calculation: 451 R Axis:   60 Text Interpretation: Sinus rhythm new Prolonged PR interval Anterior infarct, old Confirmed by Blanchie Dessert [phone]) on 01/24/2023 3:32:19 PM  Radiology DG Chest Port 1 View  Result Date:  01/24/2023 CLINICAL DATA:  Dialysis patient.  Chest pain. EXAM: PORTABLE CHEST 1 VIEW COMPARISON:  10/02/2022 FINDINGS: Heart size is normal. Central line enters the right internal jugular vein with its tip in the SVC above the right atrium. The lungs are clear. The vascularity is normal. No effusions. IMPRESSION: No active disease. Central line tip in the SVC above the right atrium. Electronically Signed   By: Nelson Chimes M.D.   On: 01/24/2023 16:00    Procedures Procedures    Medications Ordered in ED Medications - No data to display  ED Course/ Medical Decision Making/ A&P                             Medical Decision Making Amount and/or Complexity of Data Reviewed Independent Historian: EMS External Data Reviewed: notes. Labs: ordered. Decision-making details documented in ED Course. Radiology: ordered and independent interpretation performed. Decision-making details documented in ED Course. ECG/medicine tests: ordered and independent interpretation performed. Decision-making details documented in ED Course.   Pt with multiple medical problems and comorbidities and presenting today with a complaint that caries a high risk for morbidity and mortality.  Here today with complaint of chest pain which is now resolved.  Concern for ACS given patient has multiple risk factors and is on dialysis and started while on dialysis.  Concern for electrolyte abnormalities, anemia.  Symptoms are not suggestive of PE, dissection, pneumothorax, pneumonia.  Patient's vital signs are normal here his exam is reassuring.  However patient is pain-free at this time.  I  independently interpreted patient's EKG and labs.  EKG without acute findings.  CBC with improving hemoglobin now at 10, initial troponin is normal at 16 and CMP without electrolyte abnormalities consistent with end-stage renal disease.  I have independently visualized and interpreted pt's images today.  CXR without acute findings   7:02 PM Waiting for delta troponin however patient reports he wants to leave.  He denies having any further chest pain.  Had a long discussion with the patient and the person he stays with about inability to rule out that this may have been a cardiac cause and concern for possible CAD.  Discussed with him he might have chest pain again if he goes back on dialysis or he might start having it and could have a full-blown heart attack.  Patient reports that he understands this and he will follow-up as an outpatient but does not want any further testing in the hospital at this time.  Patient is awake and alert and feel that he understands the discussion and will discharge home at this time.  Vital signs remain unremarkable.        Final Clinical Impression(s) / ED Diagnoses Final diagnoses:  Precordial pain    Rx / DC Orders ED Discharge Orders          Ordered    Ambulatory referral to Cardiology       Comments: Chest pain during dialysis and did not want to stay for further work up in the ER   01/24/23 1858              Blanchie Dessert, MD 01/24/23 1904

## 2023-01-24 NOTE — ED Notes (Signed)
Pt declining lab draw at this time. States he wants to leave. Provider notified.

## 2023-01-24 NOTE — ED Triage Notes (Signed)
Pt bib EMS coming from dialysis. Around 1300 (about halfway through dialysis treatment) pt developed chest tightness. Denies SOB, dizziness, N/V. Pain improved upon arrival to ED with no intervention.

## 2023-01-25 ENCOUNTER — Encounter: Payer: Self-pay | Admitting: Nurse Practitioner

## 2023-01-25 ENCOUNTER — Ambulatory Visit (INDEPENDENT_AMBULATORY_CARE_PROVIDER_SITE_OTHER): Payer: Medicare Other | Admitting: Nurse Practitioner

## 2023-01-25 VITALS — BP 117/70 | HR 60 | Ht 72.0 in | Wt 218.4 lb

## 2023-01-25 DIAGNOSIS — R079 Chest pain, unspecified: Secondary | ICD-10-CM | POA: Diagnosis not present

## 2023-01-25 DIAGNOSIS — N186 End stage renal disease: Secondary | ICD-10-CM

## 2023-01-25 DIAGNOSIS — I152 Hypertension secondary to endocrine disorders: Secondary | ICD-10-CM

## 2023-01-25 DIAGNOSIS — E1159 Type 2 diabetes mellitus with other circulatory complications: Secondary | ICD-10-CM | POA: Diagnosis not present

## 2023-01-25 NOTE — Progress Notes (Signed)
Established patient visit   Patient: Brandon [name]   DOB: 02/03/1961   62 y.o. Male  MRN: [id number] Visit Date: 01/25/2023   Chief Complaint  Patient presents with   Medical Management of Chronic Issues   Subjective    HPI  follow up  -episode yesterday of chest pain while getting hemodialysis  -went to ER -initial troponin was normal  -ECG showed new PR interval increase and old anterior infarct.  -other labs essentially normal.  -patient reports resolution of chest pain.    Medications: Outpatient Medications Prior to Visit  Medication Sig   amLODipine (NORVASC) 10 MG tablet Take 1 tablet (10 mg total) by mouth daily.   atorvastatin (LIPITOR) 40 MG tablet Take 1 tablet (40 mg total) by mouth daily.   blood glucose meter kit and supplies Dispense based on patient and insurance preference. Use up to four times daily as directed. (FOR ICD-9 250.00, 250.01). (Patient taking differently: 1 each by Other route See admin instructions. Dispense based on patient and insurance preference. Use up to four times daily as directed. (FOR ICD-9 250.00, 250.01).)   carvedilol (COREG) 25 MG tablet Take 25 mg by mouth 2 (two) times daily.   ezetimibe (ZETIA) 10 MG tablet Take 1 tablet (10 mg total) by mouth daily.   folic acid (FOLVITE) 1 MG tablet Take 1 tablet (1 mg total) by mouth daily.   glucose blood (ONETOUCH ULTRA) test strip Blood sugar testing done TID for insulin dependant Type @ diabetes - E11.65   HYDROcodone-acetaminophen (NORCO) 5-325 MG tablet Take 1 tablet by mouth every 6 (six) hours as needed for moderate pain.   isosorbide mononitrate (IMDUR) 30 MG 24 hr tablet Take 1 tablet (30 mg total) by mouth daily.   Lancets (ACCU-CHEK SOFT TOUCH) lancets Use as instructed (Patient taking differently: 1 each by Other route as directed.)   levETIRAcetam (KEPPRA) 500 MG tablet Take 1 tablet (500 mg total) by mouth 2 (two) times daily.   multivitamin (RENA-VIT) TABS tablet Take 1 tablet  by mouth at bedtime.   sertraline (ZOLOFT) 100 MG tablet TAKE 1 TABLET BY MOUTH EVERY DAY   ferrous sulfate 325 (65 FE) MG tablet Take 1 tablet (325 mg total) by mouth daily with breakfast. (Patient not taking: Reported on 01/25/2023)   pantoprazole (PROTONIX) 40 MG tablet Take 1 tablet (40 mg total) by mouth daily. (Patient not taking: Reported on 01/25/2023)   No facility-administered medications prior to visit.    Review of Systems See HPI     Last CBC Lab Results  Component Value Date   WBC 3.2 (L) 01/24/2023   HGB 10.5 (L) 01/24/2023   HCT 31.4 (L) 01/24/2023   MCV 88.2 01/24/2023   MCH 29.5 01/24/2023   RDW 13.1 01/24/2023   PLT 148 (L) 0000000   Last metabolic panel Lab Results  Component Value Date   GLUCOSE 76 01/24/2023   NA 136 01/24/2023   K 4.8 01/24/2023   CL 97 (L) 01/24/2023   CO2 26 01/24/2023   BUN 44 (H) 01/24/2023   CREATININE 7.49 (H) 01/24/2023   GFRNONAA 8 (L) 01/24/2023   CALCIUM 8.5 (L) 01/24/2023   PHOS 2.7 10/16/2022   PROT 7.7 10/02/2022   ALBUMIN 2.5 (L) 10/16/2022   LABGLOB 3.0 04/19/2022   AGRATIO 1.3 04/19/2022   BILITOT 1.5 (H) 10/02/2022   ALKPHOS 71 10/02/2022   AST 9 (L) 10/02/2022   ALT 9 10/02/2022   ANIONGAP 13 01/24/2023   Last lipids  Lab Results  Component Value Date   CHOL 144 04/19/2022   HDL 46 04/19/2022   LDLCALC 91 04/19/2022   TRIG 28 04/19/2022   CHOLHDL 3.1 04/19/2022   Last hemoglobin A1c Lab Results  Component Value Date   HGBA1C 5.0 11/09/2022   Last thyroid functions Lab Results  Component Value Date   TSH 3.044 10/03/2022       Objective     Today's Vitals   01/25/23 1424  BP: 117/70  Pulse: 60  SpO2: 100%  Weight: 218 lb 6.4 oz (99.1 kg)  Height: 6' (1.829 m)   Body mass index is 29.62 kg/m.  BP Readings from Last 3 Encounters:  01/25/23 117/70  01/24/23 130/76  01/02/23 (Abnormal) 187/96    Wt Readings from Last 3 Encounters:  01/25/23 218 lb 6.4 oz (99.1 kg)  01/02/23 222  lb 3.2 oz (100.8 kg)  11/09/22 221 lb 1.9 oz (100.3 kg)    Physical Exam Vitals and nursing note reviewed.  Constitutional:      Appearance: Normal appearance. He is well-developed.  HENT:     Head: Normocephalic and atraumatic.     Nose: Nose normal.     Mouth/Throat:     Mouth: Mucous membranes are moist.     Pharynx: Oropharynx is clear.  Eyes:     Extraocular Movements: Extraocular movements intact.     Conjunctiva/sclera: Conjunctivae normal.     Pupils: Pupils are equal, round, and reactive to light.  Neck:     Vascular: No carotid bruit.  Cardiovascular:     Rate and Rhythm: Normal rate and regular rhythm.     Pulses: Normal pulses.     Heart sounds: Normal heart sounds.  Pulmonary:     Effort: Pulmonary effort is normal.     Breath sounds: Normal breath sounds.  Abdominal:     Palpations: Abdomen is soft.  Musculoskeletal:        General: Normal range of motion.     Cervical back: Normal range of motion and neck supple.  Lymphadenopathy:     Cervical: No cervical adenopathy.  Skin:    General: Skin is warm and dry.     Capillary Refill: Capillary refill takes less than 2 seconds.  Neurological:     General: No focal deficit present.     Mental Status: He is alert and oriented to person, place, and time.  Psychiatric:        Mood and Affect: Mood normal.        Behavior: Behavior normal.        Thought Content: Thought content normal.        Judgment: Judgment normal.      Assessment & Plan    Chest pain, unspecified type Assessment & Plan: ER visit for chest pain yesterday. Resolved today. Reviewed labs and ECG from yesterday which were reassuring. Patient to resume hemodialysis as scheduled. .    End stage renal disease ([place]) Assessment & Plan: Continue with regular hemodialysis schedule.   Hypertension associated with diabetes ([place]) Assessment & Plan: Blood pressure within normal limits today. No medication changes at this time.      Problem  List Items Addressed This Visit       Cardiovascular and Mediastinum   Hypertension associated with diabetes ([place]) (Chronic)    Blood pressure within normal limits today. No medication changes at this time.         Genitourinary   End stage renal disease ([place])  Continue with regular hemodialysis schedule.        Other   Chest pain - Primary    ER visit for chest pain yesterday. Resolved today. Reviewed labs and ECG from yesterday which were reassuring. Patient to resume hemodialysis as scheduled. .         Return for as scheduled.         Ronnell Freshwater, NP  [facility] [phone] (phone) [phone] (fax)  [place]

## 2023-01-26 DIAGNOSIS — N2581 Secondary hyperparathyroidism of renal origin: Secondary | ICD-10-CM | POA: Diagnosis not present

## 2023-01-26 DIAGNOSIS — Z992 Dependence on renal dialysis: Secondary | ICD-10-CM | POA: Diagnosis not present

## 2023-01-26 DIAGNOSIS — N186 End stage renal disease: Secondary | ICD-10-CM | POA: Diagnosis not present

## 2023-01-26 DIAGNOSIS — D631 Anemia in chronic kidney disease: Secondary | ICD-10-CM | POA: Diagnosis not present

## 2023-01-28 DIAGNOSIS — Z992 Dependence on renal dialysis: Secondary | ICD-10-CM | POA: Diagnosis not present

## 2023-01-28 DIAGNOSIS — D631 Anemia in chronic kidney disease: Secondary | ICD-10-CM | POA: Diagnosis not present

## 2023-01-28 DIAGNOSIS — N186 End stage renal disease: Secondary | ICD-10-CM | POA: Diagnosis not present

## 2023-01-28 DIAGNOSIS — N2581 Secondary hyperparathyroidism of renal origin: Secondary | ICD-10-CM | POA: Diagnosis not present

## 2023-01-31 DIAGNOSIS — N186 End stage renal disease: Secondary | ICD-10-CM | POA: Diagnosis not present

## 2023-01-31 DIAGNOSIS — Z992 Dependence on renal dialysis: Secondary | ICD-10-CM | POA: Diagnosis not present

## 2023-01-31 DIAGNOSIS — D631 Anemia in chronic kidney disease: Secondary | ICD-10-CM | POA: Diagnosis not present

## 2023-01-31 DIAGNOSIS — N2581 Secondary hyperparathyroidism of renal origin: Secondary | ICD-10-CM | POA: Diagnosis not present

## 2023-02-02 DIAGNOSIS — N2581 Secondary hyperparathyroidism of renal origin: Secondary | ICD-10-CM | POA: Diagnosis not present

## 2023-02-02 DIAGNOSIS — D631 Anemia in chronic kidney disease: Secondary | ICD-10-CM | POA: Diagnosis not present

## 2023-02-02 DIAGNOSIS — N186 End stage renal disease: Secondary | ICD-10-CM | POA: Diagnosis not present

## 2023-02-02 DIAGNOSIS — Z992 Dependence on renal dialysis: Secondary | ICD-10-CM | POA: Diagnosis not present

## 2023-02-03 DIAGNOSIS — Z992 Dependence on renal dialysis: Secondary | ICD-10-CM | POA: Diagnosis not present

## 2023-02-03 DIAGNOSIS — I129 Hypertensive chronic kidney disease with stage 1 through stage 4 chronic kidney disease, or unspecified chronic kidney disease: Secondary | ICD-10-CM | POA: Diagnosis not present

## 2023-02-03 DIAGNOSIS — N186 End stage renal disease: Secondary | ICD-10-CM | POA: Diagnosis not present

## 2023-02-04 DIAGNOSIS — N2581 Secondary hyperparathyroidism of renal origin: Secondary | ICD-10-CM | POA: Diagnosis not present

## 2023-02-04 DIAGNOSIS — Z992 Dependence on renal dialysis: Secondary | ICD-10-CM | POA: Diagnosis not present

## 2023-02-04 DIAGNOSIS — D631 Anemia in chronic kidney disease: Secondary | ICD-10-CM | POA: Diagnosis not present

## 2023-02-04 DIAGNOSIS — N186 End stage renal disease: Secondary | ICD-10-CM | POA: Diagnosis not present

## 2023-02-07 DIAGNOSIS — D631 Anemia in chronic kidney disease: Secondary | ICD-10-CM | POA: Diagnosis not present

## 2023-02-07 DIAGNOSIS — Z992 Dependence on renal dialysis: Secondary | ICD-10-CM | POA: Diagnosis not present

## 2023-02-07 DIAGNOSIS — N186 End stage renal disease: Secondary | ICD-10-CM | POA: Diagnosis not present

## 2023-02-07 DIAGNOSIS — N2581 Secondary hyperparathyroidism of renal origin: Secondary | ICD-10-CM | POA: Diagnosis not present

## 2023-02-08 ENCOUNTER — Ambulatory Visit (INDEPENDENT_AMBULATORY_CARE_PROVIDER_SITE_OTHER): Payer: Medicare Other | Admitting: Nurse Practitioner

## 2023-02-09 DIAGNOSIS — N186 End stage renal disease: Secondary | ICD-10-CM | POA: Diagnosis not present

## 2023-02-09 DIAGNOSIS — D631 Anemia in chronic kidney disease: Secondary | ICD-10-CM | POA: Diagnosis not present

## 2023-02-09 DIAGNOSIS — N2581 Secondary hyperparathyroidism of renal origin: Secondary | ICD-10-CM | POA: Diagnosis not present

## 2023-02-09 DIAGNOSIS — Z992 Dependence on renal dialysis: Secondary | ICD-10-CM | POA: Diagnosis not present

## 2023-02-11 DIAGNOSIS — Z992 Dependence on renal dialysis: Secondary | ICD-10-CM | POA: Diagnosis not present

## 2023-02-11 DIAGNOSIS — N186 End stage renal disease: Secondary | ICD-10-CM | POA: Diagnosis not present

## 2023-02-11 DIAGNOSIS — N2581 Secondary hyperparathyroidism of renal origin: Secondary | ICD-10-CM | POA: Diagnosis not present

## 2023-02-11 DIAGNOSIS — D631 Anemia in chronic kidney disease: Secondary | ICD-10-CM | POA: Diagnosis not present

## 2023-02-14 DIAGNOSIS — Z992 Dependence on renal dialysis: Secondary | ICD-10-CM | POA: Diagnosis not present

## 2023-02-14 DIAGNOSIS — N2581 Secondary hyperparathyroidism of renal origin: Secondary | ICD-10-CM | POA: Diagnosis not present

## 2023-02-14 DIAGNOSIS — N186 End stage renal disease: Secondary | ICD-10-CM | POA: Diagnosis not present

## 2023-02-14 DIAGNOSIS — D631 Anemia in chronic kidney disease: Secondary | ICD-10-CM | POA: Diagnosis not present

## 2023-02-16 DIAGNOSIS — D631 Anemia in chronic kidney disease: Secondary | ICD-10-CM | POA: Diagnosis not present

## 2023-02-16 DIAGNOSIS — Z992 Dependence on renal dialysis: Secondary | ICD-10-CM | POA: Diagnosis not present

## 2023-02-16 DIAGNOSIS — N186 End stage renal disease: Secondary | ICD-10-CM | POA: Diagnosis not present

## 2023-02-16 DIAGNOSIS — N2581 Secondary hyperparathyroidism of renal origin: Secondary | ICD-10-CM | POA: Diagnosis not present

## 2023-02-18 DIAGNOSIS — N186 End stage renal disease: Secondary | ICD-10-CM | POA: Diagnosis not present

## 2023-02-18 DIAGNOSIS — Z992 Dependence on renal dialysis: Secondary | ICD-10-CM | POA: Diagnosis not present

## 2023-02-18 DIAGNOSIS — D631 Anemia in chronic kidney disease: Secondary | ICD-10-CM | POA: Diagnosis not present

## 2023-02-18 DIAGNOSIS — N2581 Secondary hyperparathyroidism of renal origin: Secondary | ICD-10-CM | POA: Diagnosis not present

## 2023-02-21 DIAGNOSIS — N2581 Secondary hyperparathyroidism of renal origin: Secondary | ICD-10-CM | POA: Diagnosis not present

## 2023-02-21 DIAGNOSIS — Z992 Dependence on renal dialysis: Secondary | ICD-10-CM | POA: Diagnosis not present

## 2023-02-21 DIAGNOSIS — D631 Anemia in chronic kidney disease: Secondary | ICD-10-CM | POA: Diagnosis not present

## 2023-02-21 DIAGNOSIS — N186 End stage renal disease: Secondary | ICD-10-CM | POA: Diagnosis not present

## 2023-02-23 ENCOUNTER — Encounter: Payer: Self-pay | Admitting: Nephrology

## 2023-02-23 DIAGNOSIS — D631 Anemia in chronic kidney disease: Secondary | ICD-10-CM | POA: Diagnosis not present

## 2023-02-23 DIAGNOSIS — Z992 Dependence on renal dialysis: Secondary | ICD-10-CM | POA: Diagnosis not present

## 2023-02-23 DIAGNOSIS — N2581 Secondary hyperparathyroidism of renal origin: Secondary | ICD-10-CM | POA: Diagnosis not present

## 2023-02-23 DIAGNOSIS — N186 End stage renal disease: Secondary | ICD-10-CM | POA: Diagnosis not present

## 2023-02-25 DIAGNOSIS — D631 Anemia in chronic kidney disease: Secondary | ICD-10-CM | POA: Diagnosis not present

## 2023-02-25 DIAGNOSIS — N2581 Secondary hyperparathyroidism of renal origin: Secondary | ICD-10-CM | POA: Diagnosis not present

## 2023-02-25 DIAGNOSIS — Z992 Dependence on renal dialysis: Secondary | ICD-10-CM | POA: Diagnosis not present

## 2023-02-25 DIAGNOSIS — N186 End stage renal disease: Secondary | ICD-10-CM | POA: Diagnosis not present

## 2023-02-25 DIAGNOSIS — R079 Chest pain, unspecified: Secondary | ICD-10-CM | POA: Insufficient documentation

## 2023-02-25 NOTE — Assessment & Plan Note (Signed)
Blood pressure within normal limits today. No medication changes at this time.

## 2023-02-25 NOTE — Assessment & Plan Note (Signed)
Continue with regular hemodialysis schedule.

## 2023-02-25 NOTE — Assessment & Plan Note (Signed)
ER visit for chest pain yesterday. Resolved today. Reviewed labs and ECG from yesterday which were reassuring. Patient to resume hemodialysis as scheduled. [name]

## 2023-02-28 DIAGNOSIS — Z992 Dependence on renal dialysis: Secondary | ICD-10-CM | POA: Diagnosis not present

## 2023-02-28 DIAGNOSIS — D631 Anemia in chronic kidney disease: Secondary | ICD-10-CM | POA: Diagnosis not present

## 2023-02-28 DIAGNOSIS — N186 End stage renal disease: Secondary | ICD-10-CM | POA: Diagnosis not present

## 2023-02-28 DIAGNOSIS — N2581 Secondary hyperparathyroidism of renal origin: Secondary | ICD-10-CM | POA: Diagnosis not present

## 2023-03-02 DIAGNOSIS — D631 Anemia in chronic kidney disease: Secondary | ICD-10-CM | POA: Diagnosis not present

## 2023-03-02 DIAGNOSIS — N2581 Secondary hyperparathyroidism of renal origin: Secondary | ICD-10-CM | POA: Diagnosis not present

## 2023-03-02 DIAGNOSIS — Z992 Dependence on renal dialysis: Secondary | ICD-10-CM | POA: Diagnosis not present

## 2023-03-02 DIAGNOSIS — N186 End stage renal disease: Secondary | ICD-10-CM | POA: Diagnosis not present

## 2023-03-04 DIAGNOSIS — D631 Anemia in chronic kidney disease: Secondary | ICD-10-CM | POA: Diagnosis not present

## 2023-03-04 DIAGNOSIS — N186 End stage renal disease: Secondary | ICD-10-CM | POA: Diagnosis not present

## 2023-03-04 DIAGNOSIS — Z992 Dependence on renal dialysis: Secondary | ICD-10-CM | POA: Diagnosis not present

## 2023-03-04 DIAGNOSIS — N2581 Secondary hyperparathyroidism of renal origin: Secondary | ICD-10-CM | POA: Diagnosis not present

## 2023-03-06 DIAGNOSIS — N186 End stage renal disease: Secondary | ICD-10-CM | POA: Diagnosis not present

## 2023-03-06 DIAGNOSIS — I129 Hypertensive chronic kidney disease with stage 1 through stage 4 chronic kidney disease, or unspecified chronic kidney disease: Secondary | ICD-10-CM | POA: Diagnosis not present

## 2023-03-06 DIAGNOSIS — Z992 Dependence on renal dialysis: Secondary | ICD-10-CM | POA: Diagnosis not present

## 2023-03-07 DIAGNOSIS — N2581 Secondary hyperparathyroidism of renal origin: Secondary | ICD-10-CM | POA: Diagnosis not present

## 2023-03-07 DIAGNOSIS — Z992 Dependence on renal dialysis: Secondary | ICD-10-CM | POA: Diagnosis not present

## 2023-03-07 DIAGNOSIS — D631 Anemia in chronic kidney disease: Secondary | ICD-10-CM | POA: Diagnosis not present

## 2023-03-07 DIAGNOSIS — N186 End stage renal disease: Secondary | ICD-10-CM | POA: Diagnosis not present

## 2023-03-09 ENCOUNTER — Other Ambulatory Visit: Payer: Self-pay | Admitting: Nephrology

## 2023-03-09 DIAGNOSIS — Z992 Dependence on renal dialysis: Secondary | ICD-10-CM | POA: Diagnosis not present

## 2023-03-09 DIAGNOSIS — N2581 Secondary hyperparathyroidism of renal origin: Secondary | ICD-10-CM | POA: Diagnosis not present

## 2023-03-09 DIAGNOSIS — R229 Localized swelling, mass and lump, unspecified: Secondary | ICD-10-CM

## 2023-03-09 DIAGNOSIS — D631 Anemia in chronic kidney disease: Secondary | ICD-10-CM | POA: Diagnosis not present

## 2023-03-09 DIAGNOSIS — N186 End stage renal disease: Secondary | ICD-10-CM | POA: Diagnosis not present

## 2023-03-09 DIAGNOSIS — N6310 Unspecified lump in the right breast, unspecified quadrant: Secondary | ICD-10-CM

## 2023-03-11 DIAGNOSIS — Z992 Dependence on renal dialysis: Secondary | ICD-10-CM | POA: Diagnosis not present

## 2023-03-11 DIAGNOSIS — N2581 Secondary hyperparathyroidism of renal origin: Secondary | ICD-10-CM | POA: Diagnosis not present

## 2023-03-11 DIAGNOSIS — D631 Anemia in chronic kidney disease: Secondary | ICD-10-CM | POA: Diagnosis not present

## 2023-03-11 DIAGNOSIS — N186 End stage renal disease: Secondary | ICD-10-CM | POA: Diagnosis not present

## 2023-03-14 DIAGNOSIS — Z992 Dependence on renal dialysis: Secondary | ICD-10-CM | POA: Diagnosis not present

## 2023-03-14 DIAGNOSIS — N186 End stage renal disease: Secondary | ICD-10-CM | POA: Diagnosis not present

## 2023-03-14 DIAGNOSIS — D631 Anemia in chronic kidney disease: Secondary | ICD-10-CM | POA: Diagnosis not present

## 2023-03-14 DIAGNOSIS — E1122 Type 2 diabetes mellitus with diabetic chronic kidney disease: Secondary | ICD-10-CM | POA: Diagnosis not present

## 2023-03-14 DIAGNOSIS — N2581 Secondary hyperparathyroidism of renal origin: Secondary | ICD-10-CM | POA: Diagnosis not present

## 2023-03-16 DIAGNOSIS — N2581 Secondary hyperparathyroidism of renal origin: Secondary | ICD-10-CM | POA: Diagnosis not present

## 2023-03-16 DIAGNOSIS — Z992 Dependence on renal dialysis: Secondary | ICD-10-CM | POA: Diagnosis not present

## 2023-03-16 DIAGNOSIS — N186 End stage renal disease: Secondary | ICD-10-CM | POA: Diagnosis not present

## 2023-03-16 DIAGNOSIS — D631 Anemia in chronic kidney disease: Secondary | ICD-10-CM | POA: Diagnosis not present

## 2023-03-18 DIAGNOSIS — N2581 Secondary hyperparathyroidism of renal origin: Secondary | ICD-10-CM | POA: Diagnosis not present

## 2023-03-18 DIAGNOSIS — D631 Anemia in chronic kidney disease: Secondary | ICD-10-CM | POA: Diagnosis not present

## 2023-03-18 DIAGNOSIS — N186 End stage renal disease: Secondary | ICD-10-CM | POA: Diagnosis not present

## 2023-03-18 DIAGNOSIS — Z992 Dependence on renal dialysis: Secondary | ICD-10-CM | POA: Diagnosis not present

## 2023-03-19 NOTE — Progress Notes (Signed)
Erroneous encounter

## 2023-03-20 ENCOUNTER — Other Ambulatory Visit: Payer: Self-pay | Admitting: Nephrology

## 2023-03-20 ENCOUNTER — Ambulatory Visit
Admission: RE | Admit: 2023-03-20 | Discharge: 2023-03-20 | Disposition: A | Discharge status: Home / Self Care | Payer: Medicare Other | Source: Ambulatory Visit | Attending: Nephrology | Admitting: Nephrology

## 2023-03-20 DIAGNOSIS — N644 Mastodynia: Secondary | ICD-10-CM | POA: Diagnosis not present

## 2023-03-20 DIAGNOSIS — N62 Hypertrophy of breast: Secondary | ICD-10-CM | POA: Diagnosis not present

## 2023-03-20 DIAGNOSIS — N6489 Other specified disorders of breast: Secondary | ICD-10-CM

## 2023-03-20 DIAGNOSIS — N6310 Unspecified lump in the right breast, unspecified quadrant: Secondary | ICD-10-CM

## 2023-03-23 DIAGNOSIS — D631 Anemia in chronic kidney disease: Secondary | ICD-10-CM | POA: Diagnosis not present

## 2023-03-23 DIAGNOSIS — N186 End stage renal disease: Secondary | ICD-10-CM | POA: Diagnosis not present

## 2023-03-23 DIAGNOSIS — N2581 Secondary hyperparathyroidism of renal origin: Secondary | ICD-10-CM | POA: Diagnosis not present

## 2023-03-23 DIAGNOSIS — Z992 Dependence on renal dialysis: Secondary | ICD-10-CM | POA: Diagnosis not present

## 2023-03-24 ENCOUNTER — Ambulatory Visit
Admission: RE | Admit: 2023-03-24 | Discharge: 2023-03-24 | Disposition: A | Discharge status: Home / Self Care | Payer: Medicare Other | Source: Ambulatory Visit | Attending: Nephrology | Admitting: Nephrology

## 2023-03-24 DIAGNOSIS — N6322 Unspecified lump in the left breast, upper inner quadrant: Secondary | ICD-10-CM | POA: Diagnosis not present

## 2023-03-24 DIAGNOSIS — N6489 Other specified disorders of breast: Secondary | ICD-10-CM

## 2023-03-24 DIAGNOSIS — N62 Hypertrophy of breast: Secondary | ICD-10-CM | POA: Diagnosis not present

## 2023-03-24 HISTORY — PX: BREAST BIOPSY: SHX20

## 2023-03-25 ENCOUNTER — Other Ambulatory Visit: Payer: Self-pay | Admitting: Nurse Practitioner

## 2023-03-25 DIAGNOSIS — Z992 Dependence on renal dialysis: Secondary | ICD-10-CM | POA: Diagnosis not present

## 2023-03-25 DIAGNOSIS — D631 Anemia in chronic kidney disease: Secondary | ICD-10-CM | POA: Diagnosis not present

## 2023-03-25 DIAGNOSIS — N186 End stage renal disease: Secondary | ICD-10-CM | POA: Diagnosis not present

## 2023-03-25 DIAGNOSIS — N2581 Secondary hyperparathyroidism of renal origin: Secondary | ICD-10-CM | POA: Diagnosis not present

## 2023-03-25 DIAGNOSIS — I152 Hypertension secondary to endocrine disorders: Secondary | ICD-10-CM

## 2023-03-28 DIAGNOSIS — D631 Anemia in chronic kidney disease: Secondary | ICD-10-CM | POA: Diagnosis not present

## 2023-03-28 DIAGNOSIS — N186 End stage renal disease: Secondary | ICD-10-CM | POA: Diagnosis not present

## 2023-03-28 DIAGNOSIS — N2581 Secondary hyperparathyroidism of renal origin: Secondary | ICD-10-CM | POA: Diagnosis not present

## 2023-03-28 DIAGNOSIS — Z992 Dependence on renal dialysis: Secondary | ICD-10-CM | POA: Diagnosis not present

## 2023-03-30 DIAGNOSIS — N186 End stage renal disease: Secondary | ICD-10-CM | POA: Diagnosis not present

## 2023-03-30 DIAGNOSIS — N2581 Secondary hyperparathyroidism of renal origin: Secondary | ICD-10-CM | POA: Diagnosis not present

## 2023-03-30 DIAGNOSIS — D631 Anemia in chronic kidney disease: Secondary | ICD-10-CM | POA: Diagnosis not present

## 2023-03-30 DIAGNOSIS — Z992 Dependence on renal dialysis: Secondary | ICD-10-CM | POA: Diagnosis not present

## 2023-04-01 DIAGNOSIS — Z992 Dependence on renal dialysis: Secondary | ICD-10-CM | POA: Diagnosis not present

## 2023-04-01 DIAGNOSIS — N2581 Secondary hyperparathyroidism of renal origin: Secondary | ICD-10-CM | POA: Diagnosis not present

## 2023-04-01 DIAGNOSIS — D631 Anemia in chronic kidney disease: Secondary | ICD-10-CM | POA: Diagnosis not present

## 2023-04-01 DIAGNOSIS — N186 End stage renal disease: Secondary | ICD-10-CM | POA: Diagnosis not present

## 2023-04-04 DIAGNOSIS — N186 End stage renal disease: Secondary | ICD-10-CM | POA: Diagnosis not present

## 2023-04-04 DIAGNOSIS — N2581 Secondary hyperparathyroidism of renal origin: Secondary | ICD-10-CM | POA: Diagnosis not present

## 2023-04-04 DIAGNOSIS — Z992 Dependence on renal dialysis: Secondary | ICD-10-CM | POA: Diagnosis not present

## 2023-04-04 DIAGNOSIS — D631 Anemia in chronic kidney disease: Secondary | ICD-10-CM | POA: Diagnosis not present

## 2023-04-05 DIAGNOSIS — I129 Hypertensive chronic kidney disease with stage 1 through stage 4 chronic kidney disease, or unspecified chronic kidney disease: Secondary | ICD-10-CM | POA: Diagnosis not present

## 2023-04-05 DIAGNOSIS — Z992 Dependence on renal dialysis: Secondary | ICD-10-CM | POA: Diagnosis not present

## 2023-04-05 DIAGNOSIS — N186 End stage renal disease: Secondary | ICD-10-CM | POA: Diagnosis not present

## 2023-04-06 DIAGNOSIS — Z992 Dependence on renal dialysis: Secondary | ICD-10-CM | POA: Diagnosis not present

## 2023-04-06 DIAGNOSIS — D631 Anemia in chronic kidney disease: Secondary | ICD-10-CM | POA: Diagnosis not present

## 2023-04-06 DIAGNOSIS — N186 End stage renal disease: Secondary | ICD-10-CM | POA: Diagnosis not present

## 2023-04-06 DIAGNOSIS — N2581 Secondary hyperparathyroidism of renal origin: Secondary | ICD-10-CM | POA: Diagnosis not present

## 2023-04-08 DIAGNOSIS — D631 Anemia in chronic kidney disease: Secondary | ICD-10-CM | POA: Diagnosis not present

## 2023-04-08 DIAGNOSIS — N186 End stage renal disease: Secondary | ICD-10-CM | POA: Diagnosis not present

## 2023-04-08 DIAGNOSIS — N2581 Secondary hyperparathyroidism of renal origin: Secondary | ICD-10-CM | POA: Diagnosis not present

## 2023-04-08 DIAGNOSIS — Z992 Dependence on renal dialysis: Secondary | ICD-10-CM | POA: Diagnosis not present

## 2023-04-11 DIAGNOSIS — N2581 Secondary hyperparathyroidism of renal origin: Secondary | ICD-10-CM | POA: Diagnosis not present

## 2023-04-11 DIAGNOSIS — D631 Anemia in chronic kidney disease: Secondary | ICD-10-CM | POA: Diagnosis not present

## 2023-04-11 DIAGNOSIS — Z992 Dependence on renal dialysis: Secondary | ICD-10-CM | POA: Diagnosis not present

## 2023-04-11 DIAGNOSIS — N186 End stage renal disease: Secondary | ICD-10-CM | POA: Diagnosis not present

## 2023-04-13 DIAGNOSIS — Z992 Dependence on renal dialysis: Secondary | ICD-10-CM | POA: Diagnosis not present

## 2023-04-13 DIAGNOSIS — N186 End stage renal disease: Secondary | ICD-10-CM | POA: Diagnosis not present

## 2023-04-13 DIAGNOSIS — D631 Anemia in chronic kidney disease: Secondary | ICD-10-CM | POA: Diagnosis not present

## 2023-04-13 DIAGNOSIS — N2581 Secondary hyperparathyroidism of renal origin: Secondary | ICD-10-CM | POA: Diagnosis not present

## 2023-04-18 DIAGNOSIS — N2581 Secondary hyperparathyroidism of renal origin: Secondary | ICD-10-CM | POA: Diagnosis not present

## 2023-04-18 DIAGNOSIS — Z992 Dependence on renal dialysis: Secondary | ICD-10-CM | POA: Diagnosis not present

## 2023-04-18 DIAGNOSIS — D631 Anemia in chronic kidney disease: Secondary | ICD-10-CM | POA: Diagnosis not present

## 2023-04-18 DIAGNOSIS — N186 End stage renal disease: Secondary | ICD-10-CM | POA: Diagnosis not present

## 2023-04-20 DIAGNOSIS — Z992 Dependence on renal dialysis: Secondary | ICD-10-CM | POA: Diagnosis not present

## 2023-04-20 DIAGNOSIS — N2581 Secondary hyperparathyroidism of renal origin: Secondary | ICD-10-CM | POA: Diagnosis not present

## 2023-04-20 DIAGNOSIS — D631 Anemia in chronic kidney disease: Secondary | ICD-10-CM | POA: Diagnosis not present

## 2023-04-20 DIAGNOSIS — N186 End stage renal disease: Secondary | ICD-10-CM | POA: Diagnosis not present

## 2023-04-22 DIAGNOSIS — N2581 Secondary hyperparathyroidism of renal origin: Secondary | ICD-10-CM | POA: Diagnosis not present

## 2023-04-22 DIAGNOSIS — N186 End stage renal disease: Secondary | ICD-10-CM | POA: Diagnosis not present

## 2023-04-22 DIAGNOSIS — Z992 Dependence on renal dialysis: Secondary | ICD-10-CM | POA: Diagnosis not present

## 2023-04-22 DIAGNOSIS — D631 Anemia in chronic kidney disease: Secondary | ICD-10-CM | POA: Diagnosis not present

## 2023-04-25 DIAGNOSIS — N2581 Secondary hyperparathyroidism of renal origin: Secondary | ICD-10-CM | POA: Diagnosis not present

## 2023-04-25 DIAGNOSIS — N186 End stage renal disease: Secondary | ICD-10-CM | POA: Diagnosis not present

## 2023-04-25 DIAGNOSIS — D631 Anemia in chronic kidney disease: Secondary | ICD-10-CM | POA: Diagnosis not present

## 2023-04-25 DIAGNOSIS — Z992 Dependence on renal dialysis: Secondary | ICD-10-CM | POA: Diagnosis not present

## 2023-04-27 DIAGNOSIS — N186 End stage renal disease: Secondary | ICD-10-CM | POA: Diagnosis not present

## 2023-04-27 DIAGNOSIS — N2581 Secondary hyperparathyroidism of renal origin: Secondary | ICD-10-CM | POA: Diagnosis not present

## 2023-04-27 DIAGNOSIS — D631 Anemia in chronic kidney disease: Secondary | ICD-10-CM | POA: Diagnosis not present

## 2023-04-27 DIAGNOSIS — Z992 Dependence on renal dialysis: Secondary | ICD-10-CM | POA: Diagnosis not present

## 2023-04-29 DIAGNOSIS — N2581 Secondary hyperparathyroidism of renal origin: Secondary | ICD-10-CM | POA: Diagnosis not present

## 2023-04-29 DIAGNOSIS — N186 End stage renal disease: Secondary | ICD-10-CM | POA: Diagnosis not present

## 2023-04-29 DIAGNOSIS — Z992 Dependence on renal dialysis: Secondary | ICD-10-CM | POA: Diagnosis not present

## 2023-04-29 DIAGNOSIS — D631 Anemia in chronic kidney disease: Secondary | ICD-10-CM | POA: Diagnosis not present

## 2023-05-02 DIAGNOSIS — N186 End stage renal disease: Secondary | ICD-10-CM | POA: Diagnosis not present

## 2023-05-02 DIAGNOSIS — N2581 Secondary hyperparathyroidism of renal origin: Secondary | ICD-10-CM | POA: Diagnosis not present

## 2023-05-02 DIAGNOSIS — Z992 Dependence on renal dialysis: Secondary | ICD-10-CM | POA: Diagnosis not present

## 2023-05-04 DIAGNOSIS — Z992 Dependence on renal dialysis: Secondary | ICD-10-CM | POA: Diagnosis not present

## 2023-05-04 DIAGNOSIS — N2581 Secondary hyperparathyroidism of renal origin: Secondary | ICD-10-CM | POA: Diagnosis not present

## 2023-05-04 DIAGNOSIS — N186 End stage renal disease: Secondary | ICD-10-CM | POA: Diagnosis not present

## 2023-05-06 DIAGNOSIS — Z992 Dependence on renal dialysis: Secondary | ICD-10-CM | POA: Diagnosis not present

## 2023-05-06 DIAGNOSIS — N186 End stage renal disease: Secondary | ICD-10-CM | POA: Diagnosis not present

## 2023-05-06 DIAGNOSIS — N2581 Secondary hyperparathyroidism of renal origin: Secondary | ICD-10-CM | POA: Diagnosis not present

## 2023-05-06 DIAGNOSIS — I129 Hypertensive chronic kidney disease with stage 1 through stage 4 chronic kidney disease, or unspecified chronic kidney disease: Secondary | ICD-10-CM | POA: Diagnosis not present

## 2023-05-09 DIAGNOSIS — N2581 Secondary hyperparathyroidism of renal origin: Secondary | ICD-10-CM | POA: Diagnosis not present

## 2023-05-09 DIAGNOSIS — Z992 Dependence on renal dialysis: Secondary | ICD-10-CM | POA: Diagnosis not present

## 2023-05-09 DIAGNOSIS — N186 End stage renal disease: Secondary | ICD-10-CM | POA: Diagnosis not present

## 2023-05-11 DIAGNOSIS — N186 End stage renal disease: Secondary | ICD-10-CM | POA: Diagnosis not present

## 2023-05-11 DIAGNOSIS — N2581 Secondary hyperparathyroidism of renal origin: Secondary | ICD-10-CM | POA: Diagnosis not present

## 2023-05-11 DIAGNOSIS — Z992 Dependence on renal dialysis: Secondary | ICD-10-CM | POA: Diagnosis not present

## 2023-05-13 DIAGNOSIS — N2581 Secondary hyperparathyroidism of renal origin: Secondary | ICD-10-CM | POA: Diagnosis not present

## 2023-05-13 DIAGNOSIS — N186 End stage renal disease: Secondary | ICD-10-CM | POA: Diagnosis not present

## 2023-05-13 DIAGNOSIS — Z992 Dependence on renal dialysis: Secondary | ICD-10-CM | POA: Diagnosis not present

## 2023-05-16 DIAGNOSIS — N186 End stage renal disease: Secondary | ICD-10-CM | POA: Diagnosis not present

## 2023-05-16 DIAGNOSIS — N2581 Secondary hyperparathyroidism of renal origin: Secondary | ICD-10-CM | POA: Diagnosis not present

## 2023-05-16 DIAGNOSIS — Z992 Dependence on renal dialysis: Secondary | ICD-10-CM | POA: Diagnosis not present

## 2023-05-18 DIAGNOSIS — Z992 Dependence on renal dialysis: Secondary | ICD-10-CM | POA: Diagnosis not present

## 2023-05-18 DIAGNOSIS — N2581 Secondary hyperparathyroidism of renal origin: Secondary | ICD-10-CM | POA: Diagnosis not present

## 2023-05-18 DIAGNOSIS — N186 End stage renal disease: Secondary | ICD-10-CM | POA: Diagnosis not present

## 2023-05-20 DIAGNOSIS — N2581 Secondary hyperparathyroidism of renal origin: Secondary | ICD-10-CM | POA: Diagnosis not present

## 2023-05-20 DIAGNOSIS — Z992 Dependence on renal dialysis: Secondary | ICD-10-CM | POA: Diagnosis not present

## 2023-05-20 DIAGNOSIS — N186 End stage renal disease: Secondary | ICD-10-CM | POA: Diagnosis not present

## 2023-05-23 DIAGNOSIS — N2581 Secondary hyperparathyroidism of renal origin: Secondary | ICD-10-CM | POA: Diagnosis not present

## 2023-05-23 DIAGNOSIS — Z992 Dependence on renal dialysis: Secondary | ICD-10-CM | POA: Diagnosis not present

## 2023-05-23 DIAGNOSIS — N186 End stage renal disease: Secondary | ICD-10-CM | POA: Diagnosis not present

## 2023-05-24 DIAGNOSIS — I871 Compression of vein: Secondary | ICD-10-CM | POA: Diagnosis not present

## 2023-05-24 DIAGNOSIS — N186 End stage renal disease: Secondary | ICD-10-CM | POA: Diagnosis not present

## 2023-05-24 DIAGNOSIS — T82898A Other specified complication of vascular prosthetic devices, implants and grafts, initial encounter: Secondary | ICD-10-CM | POA: Diagnosis not present

## 2023-05-24 DIAGNOSIS — Z992 Dependence on renal dialysis: Secondary | ICD-10-CM | POA: Diagnosis not present

## 2023-05-25 DIAGNOSIS — Z992 Dependence on renal dialysis: Secondary | ICD-10-CM | POA: Diagnosis not present

## 2023-05-25 DIAGNOSIS — N186 End stage renal disease: Secondary | ICD-10-CM | POA: Diagnosis not present

## 2023-05-25 DIAGNOSIS — N2581 Secondary hyperparathyroidism of renal origin: Secondary | ICD-10-CM | POA: Diagnosis not present

## 2023-05-27 DIAGNOSIS — N2581 Secondary hyperparathyroidism of renal origin: Secondary | ICD-10-CM | POA: Diagnosis not present

## 2023-05-27 DIAGNOSIS — N186 End stage renal disease: Secondary | ICD-10-CM | POA: Diagnosis not present

## 2023-05-27 DIAGNOSIS — Z992 Dependence on renal dialysis: Secondary | ICD-10-CM | POA: Diagnosis not present

## 2023-05-30 DIAGNOSIS — Z992 Dependence on renal dialysis: Secondary | ICD-10-CM | POA: Diagnosis not present

## 2023-05-30 DIAGNOSIS — N2581 Secondary hyperparathyroidism of renal origin: Secondary | ICD-10-CM | POA: Diagnosis not present

## 2023-05-30 DIAGNOSIS — N186 End stage renal disease: Secondary | ICD-10-CM | POA: Diagnosis not present

## 2023-06-01 DIAGNOSIS — Z992 Dependence on renal dialysis: Secondary | ICD-10-CM | POA: Diagnosis not present

## 2023-06-01 DIAGNOSIS — N186 End stage renal disease: Secondary | ICD-10-CM | POA: Diagnosis not present

## 2023-06-01 DIAGNOSIS — N2581 Secondary hyperparathyroidism of renal origin: Secondary | ICD-10-CM | POA: Diagnosis not present

## 2023-06-03 DIAGNOSIS — Z992 Dependence on renal dialysis: Secondary | ICD-10-CM | POA: Diagnosis not present

## 2023-06-03 DIAGNOSIS — N2581 Secondary hyperparathyroidism of renal origin: Secondary | ICD-10-CM | POA: Diagnosis not present

## 2023-06-03 DIAGNOSIS — N186 End stage renal disease: Secondary | ICD-10-CM | POA: Diagnosis not present

## 2023-06-05 DIAGNOSIS — I129 Hypertensive chronic kidney disease with stage 1 through stage 4 chronic kidney disease, or unspecified chronic kidney disease: Secondary | ICD-10-CM | POA: Diagnosis not present

## 2023-06-05 DIAGNOSIS — Z992 Dependence on renal dialysis: Secondary | ICD-10-CM | POA: Diagnosis not present

## 2023-06-05 DIAGNOSIS — N186 End stage renal disease: Secondary | ICD-10-CM | POA: Diagnosis not present

## 2023-06-06 DIAGNOSIS — Z992 Dependence on renal dialysis: Secondary | ICD-10-CM | POA: Diagnosis not present

## 2023-06-06 DIAGNOSIS — D631 Anemia in chronic kidney disease: Secondary | ICD-10-CM | POA: Diagnosis not present

## 2023-06-06 DIAGNOSIS — N2581 Secondary hyperparathyroidism of renal origin: Secondary | ICD-10-CM | POA: Diagnosis not present

## 2023-06-06 DIAGNOSIS — N186 End stage renal disease: Secondary | ICD-10-CM | POA: Diagnosis not present

## 2023-06-08 DIAGNOSIS — Z992 Dependence on renal dialysis: Secondary | ICD-10-CM | POA: Diagnosis not present

## 2023-06-08 DIAGNOSIS — D631 Anemia in chronic kidney disease: Secondary | ICD-10-CM | POA: Diagnosis not present

## 2023-06-08 DIAGNOSIS — N2581 Secondary hyperparathyroidism of renal origin: Secondary | ICD-10-CM | POA: Diagnosis not present

## 2023-06-08 DIAGNOSIS — N186 End stage renal disease: Secondary | ICD-10-CM | POA: Diagnosis not present

## 2023-06-10 DIAGNOSIS — N186 End stage renal disease: Secondary | ICD-10-CM | POA: Diagnosis not present

## 2023-06-10 DIAGNOSIS — D631 Anemia in chronic kidney disease: Secondary | ICD-10-CM | POA: Diagnosis not present

## 2023-06-10 DIAGNOSIS — N2581 Secondary hyperparathyroidism of renal origin: Secondary | ICD-10-CM | POA: Diagnosis not present

## 2023-06-10 DIAGNOSIS — Z992 Dependence on renal dialysis: Secondary | ICD-10-CM | POA: Diagnosis not present

## 2023-06-13 DIAGNOSIS — N2581 Secondary hyperparathyroidism of renal origin: Secondary | ICD-10-CM | POA: Diagnosis not present

## 2023-06-13 DIAGNOSIS — D631 Anemia in chronic kidney disease: Secondary | ICD-10-CM | POA: Diagnosis not present

## 2023-06-13 DIAGNOSIS — Z992 Dependence on renal dialysis: Secondary | ICD-10-CM | POA: Diagnosis not present

## 2023-06-13 DIAGNOSIS — N186 End stage renal disease: Secondary | ICD-10-CM | POA: Diagnosis not present

## 2023-06-15 DIAGNOSIS — N186 End stage renal disease: Secondary | ICD-10-CM | POA: Diagnosis not present

## 2023-06-15 DIAGNOSIS — Z992 Dependence on renal dialysis: Secondary | ICD-10-CM | POA: Diagnosis not present

## 2023-06-15 DIAGNOSIS — N2581 Secondary hyperparathyroidism of renal origin: Secondary | ICD-10-CM | POA: Diagnosis not present

## 2023-06-15 DIAGNOSIS — D631 Anemia in chronic kidney disease: Secondary | ICD-10-CM | POA: Diagnosis not present

## 2023-06-20 ENCOUNTER — Encounter (HOSPITAL_COMMUNITY): Payer: Self-pay

## 2023-06-20 ENCOUNTER — Ambulatory Visit (HOSPITAL_COMMUNITY): Payer: Medicare Other

## 2023-06-20 ENCOUNTER — Ambulatory Visit (HOSPITAL_COMMUNITY)
Admission: EM | Admit: 2023-06-20 | Discharge: 2023-06-20 | Disposition: A | Discharge status: Home / Self Care | Payer: Medicare Other | Attending: Nurse Practitioner | Admitting: Nurse Practitioner

## 2023-06-20 VITALS — BP 141/84 | HR 80 | Temp 98.4°F | Resp 17

## 2023-06-20 DIAGNOSIS — M19071 Primary osteoarthritis, right ankle and foot: Secondary | ICD-10-CM | POA: Diagnosis not present

## 2023-06-20 DIAGNOSIS — M25571 Pain in right ankle and joints of right foot: Secondary | ICD-10-CM | POA: Diagnosis not present

## 2023-06-20 DIAGNOSIS — M85871 Other specified disorders of bone density and structure, right ankle and foot: Secondary | ICD-10-CM | POA: Diagnosis not present

## 2023-06-20 DIAGNOSIS — M7989 Other specified soft tissue disorders: Secondary | ICD-10-CM | POA: Diagnosis not present

## 2023-06-20 MED ORDER — ACETAMINOPHEN 500 MG PO TABS: 500.0000 mg | ORAL_TABLET | Freq: Four times a day (QID) | ORAL | 0 refills | Status: DC | PRN

## 2023-06-20 NOTE — Discharge Instructions (Addendum)
The ankle xray today does not show any broken bones.  Please use the Ace wrap to help keep the ankle stabilized and help with pain and swelling.  Elevate your foot when sitting down and apply ice 15 minutes on, 45 minutes off every hour while awake.  You can take Tylenol [phone] mg every 6 hours for pain.

## 2023-06-20 NOTE — ED Provider Notes (Signed)
[facility]    CSN: [id number] Arrival date & time: 06/20/23  1328      History   Chief Complaint Chief Complaint  Patient presents with   appt 2   Ankle Pain    HPI Brandon [name] is a 62 y.o. male.   Patient presents today with a couple days of right ankle pain and swelling on the inside of his ankle.  He denies recent known trauma or injury to the ankle.  Reports he has had a stroke in the past and his memory is not very good.  He has tried elevating and applying ice to the ankle which did not seem to help very much.  He denies numbness or tingling in his toes or inability to ambulate.    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 ischemic stroke   GERD (gastroesophageal reflux disease)    Hiatal hernia    Hyperlipidemia    Hypertension    Seizures (HCC)    none since 2016, taking Keppra - maw   Sleep apnea    Thyroid disease    "weak thyroid" per MD   Wears glasses     Patient Active Problem List   Diagnosis Date Noted   Chest pain 02/25/2023   End stage renal disease (HCC) 11/29/2022   Hemodialysis status (HCC) 11/29/2022   Acute respiratory failure with hypoxia (HCC) 10/02/2022   Hypertensive urgency 10/02/2022   Acute on chronic diastolic CHF (congestive heart failure) (HCC) 10/02/2022   Anemia due to chronic kidney disease 10/02/2022   Cognitive impairment 10/02/2022   Diabetic nephropathy associated with type 2 diabetes mellitus (HCC) 04/28/2022   Iron deficiency anemia  04/28/2022   Obesity (BMI 30-39.9) 04/17/2022   Protein-calorie malnutrition, severe 12/01/2021   Acute renal failure superimposed on stage 4 chronic kidney disease (HCC) 11/30/2021   AMS (altered mental status) 11/29/2021   Hypokalemia 11/29/2021   Callus 10/19/2021   Enlarged and hypertrophic nails 10/19/2021   Open wound of toe 10/19/2021   Mental health disorder 10/19/2021   Delirium 09/21/2021   Polypharmacy 09/18/2020   Anemia 04/16/2020   Anxiety 04/16/2020   Controlled type 2 diabetes mellitus without complication, without long-term current use of insulin (HCC) 02/09/2020   Type 2 diabetes mellitus with diabetic polyneuropathy, with long-term current use of insulin (HCC) 02/09/2020   Family history of premature CAD 01/27/2020   Stage 3b chronic kidney disease (HCC) 01/09/2020   Incontinence of feces 01/09/2020   Chronic pain of left knee 01/09/2020   Fall 01/09/2020   History of diverticulitis 10/30/2019   Noncompliance 06/10/2019   History of renal cell cancer 04/24/2018   Erectile dysfunction 04/24/2018   Edema 03/15/2018   Seizure disorder (HCC) 03/01/2018   History of stroke 11/29/2017   Obstructive sleep apnea 11/29/2017   Hyperlipidemia associated with type 2 diabetes mellitus (HCC) 05/10/2017   Hypertension associated with diabetes (HCC) 05/10/2017   History  of cerebrovascular accident (CVA) with residual deficit 05/10/2017   Diabetic ulcer of left foot associated with secondary diabetes mellitus (HCC) 04/21/2016   Depression, recurrent (HCC) 09/21/2014    Past Surgical History:  Procedure Laterality Date   AV FISTULA PLACEMENT Left 10/14/2022   Procedure: LEFT BRACHIOCAPHALIC FISTULA CREATION;  Surgeon: Cephus Shelling, MD;  Location: [facility];  Service: Vascular;  Laterality: Left;   BREAST BIOPSY Left 03/24/2023   [place] LT BREAST BX W LOC DEV 1ST LESION IMG BX SPEC US GUIDE 03/24/2023 [facility]   COLONOSCOPY     IR FLUORO GUIDE CV LINE RIGHT   10/06/2022   IR RADIOLOGIST EVAL & MGMT  04/04/2017   IR US GUIDE VASC ACCESS RIGHT  10/06/2022   KIDNEY SURGERY     ablation of renal cell CA - 12/28, prior one was October 2012-Dr. Yamagata   TEE WITHOUT CARDIOVERSION N/A 11/19/2013   Procedure: TRANSESOPHAGEAL ECHOCARDIOGRAM (TEE);  Surgeon: Lewayne Bunting, MD;  Location: [facility] ENDOSCOPY;  Service: Cardiovascular;  Laterality: N/A;       Home Medications    Prior to Admission medications   Medication Sig Start Date End Date Taking? Authorizing Provider  acetaminophen (TYLENOL) 500 MG tablet Take 1 tablet (500 mg total) by mouth every 6 (six) hours as needed. 06/20/23  Yes Cathlean Marseilles A, NP  amLODipine (NORVASC) 10 MG tablet TAKE 2 TABLETS BY MOUTH EVERY DAY 03/27/23   Carlean Jews, NP  atorvastatin (LIPITOR) 40 MG tablet Take 1 tablet (40 mg total) by mouth daily. 08/01/22   Carlean Jews, NP  blood glucose meter kit and supplies Dispense based on patient and insurance preference. Use up to four times daily as directed. (FOR ICD-9 250.00, 250.01). Patient taking differently: 1 each by Other route See admin instructions. Dispense based on patient and insurance preference. Use up to four times daily as directed. (FOR ICD-9 250.00, 250.01). 04/24/16   Rodolph Bong, MD  carvedilol (COREG) 25 MG tablet Take 25 mg by mouth 2 (two) times daily. 09/11/22   [provider]  ezetimibe (ZETIA) 10 MG tablet Take 1 tablet (10 mg total) by mouth daily. 10/19/21 10/03/23  Tysinger, Kermit Balo, PA-C  ferrous sulfate 325 (65 FE) MG tablet Take 1 tablet (325 mg total) by mouth daily with breakfast. Patient not taking: Reported on 01/25/2023 10/16/22   Leroy Sea, MD  folic acid (FOLVITE) 1 MG tablet Take 1 tablet (1 mg total) by mouth daily. 11/09/22   Carlean Jews, NP  glucose blood (ONETOUCH ULTRA) test strip Blood sugar testing done TID for insulin dependant Type @ diabetes - E11.65 04/12/22   Carlean Jews, NP   HYDROcodone-acetaminophen (NORCO) 5-325 MG tablet Take 1 tablet by mouth every 6 (six) hours as needed for moderate pain. 03/18/22   Horton, Clabe Seal, DO  isosorbide mononitrate (IMDUR) 30 MG 24 hr tablet Take 1 tablet (30 mg total) by mouth daily. 10/16/22   Leroy Sea, MD  Lancets (ACCU-CHEK SOFT TOUCH) lancets Use as instructed Patient taking differently: 1 each by Other route as directed. 06/11/19   Tysinger, Kermit Balo, PA-C  levETIRAcetam (KEPPRA) 500 MG tablet Take 1 tablet (500 mg total) by mouth 2 (two) times daily. 10/16/22   Leroy Sea, MD  multivitamin (RENA-VIT) TABS tablet Take 1 tablet by mouth at bedtime. 11/09/22   Carlean Jews, NP  pantoprazole (PROTONIX) 40 MG tablet Take 1 tablet (40 mg total) by mouth daily. Patient  not taking: Reported on 01/25/2023 10/16/22   Leroy Sea, MD  sertraline (ZOLOFT) 100 MG tablet TAKE 1 TABLET BY MOUTH EVERY DAY 11/29/22   Carlean Jews, NP    Family History Family History  Problem Relation Age of Onset   Pneumonia Mother    Breast cancer Mother    Diabetes Father    Heart disease Father    Breast cancer Sister    Prostate cancer Other    Stomach cancer Other    Colon cancer Neg Hx    Esophageal cancer Neg Hx    Rectal cancer Neg Hx     Social History Social History   Tobacco Use   Smoking status: Never    Passive exposure: Never   Smokeless tobacco: Never  Vaping Use   Vaping status: Never Used  Substance Use Topics   Alcohol use: Not Currently    Comment: rarely   Drug use: No     Allergies   Hydralazine, Imdur [isosorbide nitrate], and Morphine   Review of Systems Review of Systems Per HPI  Physical Exam Triage Vital Signs ED Triage Vitals  Encounter Vitals Group     BP 06/20/23 1402 (!) 141/84     Systolic BP Percentile --      Diastolic BP Percentile --      Pulse Rate 06/20/23 1402 80     Resp 06/20/23 1402 17     Temp 06/20/23 1402 98.4 F (36.9 C)     Temp Source 06/20/23  1402 Oral     SpO2 06/20/23 1402 98 %     Weight --      Height --      Head Circumference --      Peak Flow --      Pain Score 06/20/23 1400 7     Pain Loc --      Pain Education --      Exclude from Growth Chart --    No data found.  Updated Vital Signs BP (!) 141/84 (BP Location: Right Arm)   Pulse 80   Temp 98.4 F (36.9 C) (Oral)   Resp 17   SpO2 98%   Visual Acuity Right Eye Distance:   Left Eye Distance:   Bilateral Distance:    Right Eye Near:   Left Eye Near:    Bilateral Near:     Physical Exam Vitals and nursing note reviewed.  Constitutional:      General: He is not in acute distress.    Appearance: Normal appearance. He is not toxic-appearing.  Pulmonary:     Effort: Pulmonary effort is normal. No respiratory distress.  Musculoskeletal:     Right lower leg: No edema.     Left lower leg: No edema.     Comments: Inspection: Mild swelling to the medial malleolus, no bruising, obvious deformity or redness Palpation: tender to palpation at the medial malleolus diffusely; no obvious deformities palpated ROM: Full ROM to bilateral lower extremities Strength: 5/5 bilateral lower extremities Neurovascular: neurovascularly intact in bilateral lower extremities  Skin:    General: Skin is warm and dry.     Capillary Refill: Capillary refill takes less than 2 seconds.     Coloration: Skin is not jaundiced or pale.     Findings: No erythema.  Neurological:     Mental Status: He is alert and oriented to person, place, and time.  Psychiatric:        Behavior: Behavior is cooperative.  UC Treatments / Results  Labs (all labs ordered are listed, but only abnormal results are displayed) Labs Reviewed - No data to display  EKG   Radiology DG Ankle Complete Right  Result Date: 06/20/2023 CLINICAL DATA:  Right ankle pain and swelling. EXAM: RIGHT ANKLE - COMPLETE 3+ VIEW COMPARISON:  None Available. FINDINGS: The bones appear osteopenic. No signs of  acute fracture or dislocation. There is a focal cortical lucency with surrounding sclerosis along the plantar surface of the calcaneus. Cannot rule out stress related injury. Plantar and posterior calcaneal heel spurs identified. Moderate degenerative type changes are noted within the tarsal bones. IMPRESSION: 1. No acute findings. 2. Focal cortical lucency with surrounding sclerosis along the plantar surface of the calcaneus. Cannot exclude stress related injury. 3. Osteopenia and degenerative change. Electronically Signed   By: Signa Kell M.D.   On: 06/20/2023 15:30    Procedures Procedures (including critical care time)  Medications Ordered in UC Medications - No data to display  Initial Impression / Assessment and Plan / UC Course  I have reviewed the triage vital signs and the nursing notes.  Pertinent labs & imaging results that were available during my care of the patient were reviewed by me and considered in my medical decision making (see chart for details).   Patient is well-appearing, normotensive, afebrile, not tachycardic, not tachypneic, oxygenating well on room air.    1. Acute right ankle pain X-ray imaging today is negative for acute bony abnormality Suspect sprain, recommended rest, ice, compression, elevation Can take Tylenol as needed for pain Seek care for persistent/worsening symptoms despite treatment   The patient was given the opportunity to ask questions.  All questions answered to their satisfaction.  The patient is in agreement to this plan.    Final Clinical Impressions(s) / UC Diagnoses   Final diagnoses:  Acute right ankle pain     Discharge Instructions      The ankle xray today does not show any broken bones.  Please use the Ace wrap to help keep the ankle stabilized and help with pain and swelling.  Elevate your foot when sitting down and apply ice 15 minutes on, 45 minutes off every hour while awake.  You can take Tylenol [phone] mg every 6  hours for pain.    ED Prescriptions     Medication Sig Dispense Auth. Provider   acetaminophen (TYLENOL) 500 MG tablet Take 1 tablet (500 mg total) by mouth every 6 (six) hours as needed. 30 tablet Valentino Nose, NP      I have reviewed the PDMP during this encounter.   Valentino Nose, NP 06/20/23 [phone]

## 2023-06-20 NOTE — ED Triage Notes (Signed)
Pt c/o right ankle pain for a couple days after he "stumbled a little bit but didn't fall". Has some swelling to area. Tried elevating and icing one day. Pain worse at night time

## 2023-06-21 ENCOUNTER — Telehealth: Payer: Self-pay | Admitting: *Deleted

## 2023-06-21 NOTE — Telephone Encounter (Signed)
Tried to contact pt about message that he left and the phone goes straight to message stating that VM hasn't been set up yet. Will try again later.  His message mentions something about eligibility for a program.

## 2023-06-22 DIAGNOSIS — N2581 Secondary hyperparathyroidism of renal origin: Secondary | ICD-10-CM | POA: Diagnosis not present

## 2023-06-22 DIAGNOSIS — N186 End stage renal disease: Secondary | ICD-10-CM | POA: Diagnosis not present

## 2023-06-22 DIAGNOSIS — D631 Anemia in chronic kidney disease: Secondary | ICD-10-CM | POA: Diagnosis not present

## 2023-06-22 DIAGNOSIS — Z992 Dependence on renal dialysis: Secondary | ICD-10-CM | POA: Diagnosis not present

## 2023-06-24 DIAGNOSIS — D631 Anemia in chronic kidney disease: Secondary | ICD-10-CM | POA: Diagnosis not present

## 2023-06-24 DIAGNOSIS — N186 End stage renal disease: Secondary | ICD-10-CM | POA: Diagnosis not present

## 2023-06-24 DIAGNOSIS — Z992 Dependence on renal dialysis: Secondary | ICD-10-CM | POA: Diagnosis not present

## 2023-06-24 DIAGNOSIS — N2581 Secondary hyperparathyroidism of renal origin: Secondary | ICD-10-CM | POA: Diagnosis not present

## 2023-06-27 DIAGNOSIS — N186 End stage renal disease: Secondary | ICD-10-CM | POA: Diagnosis not present

## 2023-06-27 DIAGNOSIS — N2581 Secondary hyperparathyroidism of renal origin: Secondary | ICD-10-CM | POA: Diagnosis not present

## 2023-06-27 DIAGNOSIS — Z992 Dependence on renal dialysis: Secondary | ICD-10-CM | POA: Diagnosis not present

## 2023-06-27 DIAGNOSIS — D631 Anemia in chronic kidney disease: Secondary | ICD-10-CM | POA: Diagnosis not present

## 2023-06-28 ENCOUNTER — Other Ambulatory Visit: Payer: Self-pay | Admitting: Nurse Practitioner

## 2023-06-28 DIAGNOSIS — F339 Major depressive disorder, recurrent, unspecified: Secondary | ICD-10-CM

## 2023-06-29 DIAGNOSIS — N186 End stage renal disease: Secondary | ICD-10-CM | POA: Diagnosis not present

## 2023-06-29 DIAGNOSIS — Z992 Dependence on renal dialysis: Secondary | ICD-10-CM | POA: Diagnosis not present

## 2023-06-29 DIAGNOSIS — D631 Anemia in chronic kidney disease: Secondary | ICD-10-CM | POA: Diagnosis not present

## 2023-06-29 DIAGNOSIS — N2581 Secondary hyperparathyroidism of renal origin: Secondary | ICD-10-CM | POA: Diagnosis not present

## 2023-06-30 DIAGNOSIS — Z992 Dependence on renal dialysis: Secondary | ICD-10-CM | POA: Diagnosis not present

## 2023-06-30 DIAGNOSIS — N186 End stage renal disease: Secondary | ICD-10-CM | POA: Diagnosis not present

## 2023-06-30 DIAGNOSIS — Z452 Encounter for adjustment and management of vascular access device: Secondary | ICD-10-CM | POA: Diagnosis not present

## 2023-07-01 DIAGNOSIS — N2581 Secondary hyperparathyroidism of renal origin: Secondary | ICD-10-CM | POA: Diagnosis not present

## 2023-07-01 DIAGNOSIS — Z992 Dependence on renal dialysis: Secondary | ICD-10-CM | POA: Diagnosis not present

## 2023-07-01 DIAGNOSIS — N186 End stage renal disease: Secondary | ICD-10-CM | POA: Diagnosis not present

## 2023-07-01 DIAGNOSIS — D631 Anemia in chronic kidney disease: Secondary | ICD-10-CM | POA: Diagnosis not present

## 2023-07-04 DIAGNOSIS — N2581 Secondary hyperparathyroidism of renal origin: Secondary | ICD-10-CM | POA: Diagnosis not present

## 2023-07-04 DIAGNOSIS — Z992 Dependence on renal dialysis: Secondary | ICD-10-CM | POA: Diagnosis not present

## 2023-07-04 DIAGNOSIS — N186 End stage renal disease: Secondary | ICD-10-CM | POA: Diagnosis not present

## 2023-07-04 DIAGNOSIS — D631 Anemia in chronic kidney disease: Secondary | ICD-10-CM | POA: Diagnosis not present

## 2023-07-06 DIAGNOSIS — Z992 Dependence on renal dialysis: Secondary | ICD-10-CM | POA: Diagnosis not present

## 2023-07-06 DIAGNOSIS — N186 End stage renal disease: Secondary | ICD-10-CM | POA: Diagnosis not present

## 2023-07-06 DIAGNOSIS — I129 Hypertensive chronic kidney disease with stage 1 through stage 4 chronic kidney disease, or unspecified chronic kidney disease: Secondary | ICD-10-CM | POA: Diagnosis not present

## 2023-07-08 DIAGNOSIS — N186 End stage renal disease: Secondary | ICD-10-CM | POA: Diagnosis not present

## 2023-07-08 DIAGNOSIS — N2581 Secondary hyperparathyroidism of renal origin: Secondary | ICD-10-CM | POA: Diagnosis not present

## 2023-07-08 DIAGNOSIS — Z992 Dependence on renal dialysis: Secondary | ICD-10-CM | POA: Diagnosis not present

## 2023-07-11 DIAGNOSIS — N186 End stage renal disease: Secondary | ICD-10-CM | POA: Diagnosis not present

## 2023-07-11 DIAGNOSIS — Z992 Dependence on renal dialysis: Secondary | ICD-10-CM | POA: Diagnosis not present

## 2023-07-11 DIAGNOSIS — N2581 Secondary hyperparathyroidism of renal origin: Secondary | ICD-10-CM | POA: Diagnosis not present

## 2023-07-12 ENCOUNTER — Ambulatory Visit: Payer: Medicare Other | Admitting: Family Medicine

## 2023-07-12 NOTE — Progress Notes (Deleted)
   Established Patient Office Visit  Subjective   Patient ID: Brandon [name], male    DOB: 03-24-1961  Age: 62 y.o. MRN: [id number]  No chief complaint on file.   HPI  Diabetes  ESRD-transplant?  Fill history only shows amlodipine, carvedilol, iron, folic acid, sertraline  The ASCVD Risk score (Arnett DK, et al., 2019) failed to calculate for the following reasons:   The patient has a prior MI or stroke diagnosis  Health Maintenance Due  Topic Date Due   Hepatitis C Screening  Never done   Zoster Vaccines- Shingrix (1 of 2) Never done   COVID-19 Vaccine (4 - 2023-24 season) 08/05/2022   FOOT EXAM  10/19/2022   OPHTHALMOLOGY EXAM  04/27/2023   HEMOGLOBIN A1C  05/11/2023   INFLUENZA VACCINE  07/06/2023   Medicare Annual Wellness (AWV)  08/02/2023      Objective:     There were no vitals taken for this visit. {Vitals History (Optional):23777}  Physical Exam   No results found for any visits on 07/12/23.      Assessment & Plan:   There are no diagnoses linked to this encounter.   No follow-ups on file.    Sandre Kitty, MD

## 2023-07-13 DIAGNOSIS — N186 End stage renal disease: Secondary | ICD-10-CM | POA: Diagnosis not present

## 2023-07-13 DIAGNOSIS — N2581 Secondary hyperparathyroidism of renal origin: Secondary | ICD-10-CM | POA: Diagnosis not present

## 2023-07-13 DIAGNOSIS — Z992 Dependence on renal dialysis: Secondary | ICD-10-CM | POA: Diagnosis not present

## 2023-07-15 DIAGNOSIS — Z992 Dependence on renal dialysis: Secondary | ICD-10-CM | POA: Diagnosis not present

## 2023-07-15 DIAGNOSIS — N2581 Secondary hyperparathyroidism of renal origin: Secondary | ICD-10-CM | POA: Diagnosis not present

## 2023-07-15 DIAGNOSIS — N186 End stage renal disease: Secondary | ICD-10-CM | POA: Diagnosis not present

## 2023-07-20 DIAGNOSIS — N2581 Secondary hyperparathyroidism of renal origin: Secondary | ICD-10-CM | POA: Diagnosis not present

## 2023-07-20 DIAGNOSIS — N186 End stage renal disease: Secondary | ICD-10-CM | POA: Diagnosis not present

## 2023-07-20 DIAGNOSIS — Z992 Dependence on renal dialysis: Secondary | ICD-10-CM | POA: Diagnosis not present

## 2023-07-22 DIAGNOSIS — Z992 Dependence on renal dialysis: Secondary | ICD-10-CM | POA: Diagnosis not present

## 2023-07-22 DIAGNOSIS — N186 End stage renal disease: Secondary | ICD-10-CM | POA: Diagnosis not present

## 2023-07-22 DIAGNOSIS — N2581 Secondary hyperparathyroidism of renal origin: Secondary | ICD-10-CM | POA: Diagnosis not present

## 2023-07-25 DIAGNOSIS — Z992 Dependence on renal dialysis: Secondary | ICD-10-CM | POA: Diagnosis not present

## 2023-07-25 DIAGNOSIS — N2581 Secondary hyperparathyroidism of renal origin: Secondary | ICD-10-CM | POA: Diagnosis not present

## 2023-07-25 DIAGNOSIS — N186 End stage renal disease: Secondary | ICD-10-CM | POA: Diagnosis not present

## 2023-07-27 DIAGNOSIS — Z992 Dependence on renal dialysis: Secondary | ICD-10-CM | POA: Diagnosis not present

## 2023-07-27 DIAGNOSIS — N186 End stage renal disease: Secondary | ICD-10-CM | POA: Diagnosis not present

## 2023-07-27 DIAGNOSIS — N2581 Secondary hyperparathyroidism of renal origin: Secondary | ICD-10-CM | POA: Diagnosis not present

## 2023-08-01 DIAGNOSIS — N2581 Secondary hyperparathyroidism of renal origin: Secondary | ICD-10-CM | POA: Diagnosis not present

## 2023-08-01 DIAGNOSIS — Z992 Dependence on renal dialysis: Secondary | ICD-10-CM | POA: Diagnosis not present

## 2023-08-01 DIAGNOSIS — N186 End stage renal disease: Secondary | ICD-10-CM | POA: Diagnosis not present

## 2023-08-05 DIAGNOSIS — Z992 Dependence on renal dialysis: Secondary | ICD-10-CM | POA: Diagnosis not present

## 2023-08-05 DIAGNOSIS — N2581 Secondary hyperparathyroidism of renal origin: Secondary | ICD-10-CM | POA: Diagnosis not present

## 2023-08-05 DIAGNOSIS — N186 End stage renal disease: Secondary | ICD-10-CM | POA: Diagnosis not present

## 2023-08-06 DIAGNOSIS — N186 End stage renal disease: Secondary | ICD-10-CM | POA: Diagnosis not present

## 2023-08-06 DIAGNOSIS — Z992 Dependence on renal dialysis: Secondary | ICD-10-CM | POA: Diagnosis not present

## 2023-08-06 DIAGNOSIS — I129 Hypertensive chronic kidney disease with stage 1 through stage 4 chronic kidney disease, or unspecified chronic kidney disease: Secondary | ICD-10-CM | POA: Diagnosis not present

## 2023-08-08 DIAGNOSIS — Z992 Dependence on renal dialysis: Secondary | ICD-10-CM | POA: Diagnosis not present

## 2023-08-08 DIAGNOSIS — N2581 Secondary hyperparathyroidism of renal origin: Secondary | ICD-10-CM | POA: Diagnosis not present

## 2023-08-08 DIAGNOSIS — D631 Anemia in chronic kidney disease: Secondary | ICD-10-CM | POA: Diagnosis not present

## 2023-08-08 DIAGNOSIS — N186 End stage renal disease: Secondary | ICD-10-CM | POA: Diagnosis not present

## 2023-08-10 DIAGNOSIS — N186 End stage renal disease: Secondary | ICD-10-CM | POA: Diagnosis not present

## 2023-08-10 DIAGNOSIS — Z992 Dependence on renal dialysis: Secondary | ICD-10-CM | POA: Diagnosis not present

## 2023-08-10 DIAGNOSIS — N2581 Secondary hyperparathyroidism of renal origin: Secondary | ICD-10-CM | POA: Diagnosis not present

## 2023-08-10 DIAGNOSIS — D631 Anemia in chronic kidney disease: Secondary | ICD-10-CM | POA: Diagnosis not present

## 2023-08-14 ENCOUNTER — Encounter: Payer: Medicare Other | Admitting: Nurse Practitioner

## 2023-08-15 ENCOUNTER — Ambulatory Visit (INDEPENDENT_AMBULATORY_CARE_PROVIDER_SITE_OTHER): Payer: Medicare Other

## 2023-08-15 VITALS — Ht 72.0 in | Wt 209.0 lb

## 2023-08-15 DIAGNOSIS — N186 End stage renal disease: Secondary | ICD-10-CM | POA: Diagnosis not present

## 2023-08-15 DIAGNOSIS — Z0000 Encounter for general adult medical examination without abnormal findings: Secondary | ICD-10-CM

## 2023-08-15 DIAGNOSIS — D631 Anemia in chronic kidney disease: Secondary | ICD-10-CM | POA: Diagnosis not present

## 2023-08-15 DIAGNOSIS — N2581 Secondary hyperparathyroidism of renal origin: Secondary | ICD-10-CM | POA: Diagnosis not present

## 2023-08-15 DIAGNOSIS — Z992 Dependence on renal dialysis: Secondary | ICD-10-CM | POA: Diagnosis not present

## 2023-08-15 NOTE — Patient Instructions (Addendum)
Brandon [name] , Thank you for taking time to come for your Medicare Wellness Visit. I appreciate your ongoing commitment to your health goals. Please review the following plan we discussed and let me know if I can assist you in the future.   Referrals/Orders/Follow-Ups/Clinician Recommendations:   This is a list of the screening recommended for you and due dates:  Health Maintenance  Topic Date Due   Hepatitis C Screening  Never done   Zoster (Shingles) Vaccine (1 of 2) Never done   Complete foot exam   10/19/2022   Eye exam for diabetics  04/27/2023   Hemoglobin A1C  05/11/2023   Flu Shot  07/06/2023   COVID-19 Vaccine (4 - 2023-24 season) 08/06/2023   Medicare Annual Wellness Visit  08/14/2024   Colon Cancer Screening  06/16/2027   DTaP/Tdap/Td vaccine (3 - Td or Tdap) 10/20/2031   HIV Screening  Completed   HPV Vaccine  Aged Out   Opioid Pain Medicine Management Opioid pain medicines are strong medicines that are used to treat bad or very bad pain. When you take them for a short time, they can help you: Sleep better. Do better in physical therapy. Feel better during the first few days after you get hurt. Recover from surgery. Only take these medicines if a doctor says that you can. You should only take them for a short time. This is because opioids can be very addictive. This means that they are hard to stop taking. The longer you take opioids, the harder it may be to stop taking them. What are the risks? Opioids can cause problems (side effects). Taking them for more than 3 days raises your chance of problems, such as: Trouble pooping (constipation). Feeling sick to your stomach (nausea). Vomiting. Feeling very sleepy. Confusion. Not being able to stop taking the medicine. Breathing problems. Taking opioids for a long time can make it hard for you to do daily tasks. It can also put you at risk for: Car accidents. Depression. Suicide. Heart attack. Taking too much of the  medicine (overdose). This can lead to death. What is a pain treatment plan? A pain treatment plan is a plan made by you and your doctor. Work with your doctor to make a plan for treating your pain. To help you do this: Talk about the goals of your treatment, including: How much pain you might expect to have. How you will manage the pain. Talk about the risks and benefits of taking these medicines for your condition. Remember that a good treatment plan uses more than one approach and lowers the risks of side effects. Tell your doctor about the amount of medicines you take and about any drug or alcohol use. Get your pain medicine prescriptions from only one doctor. Pain can be managed with other treatments. Work with your doctor to find other ways to help your pain, such as: Physical therapy or doing gentle exercises. Counseling. Eating healthy foods. Massage. Meditation. Other pain medicines. How to use opioid pain medicine safely Taking medicine Take your pain medicine exactly as told by your doctor. Take it only when you need it. If your pain is not too bad, you may take less medicine if your doctor allows. If you have no pain, do not take the medicine unless your doctor tells you to take it. If your pain is very bad, do not take more medicine than your doctor told you to take. Call your doctor to know what to do. Write down the times  when you take your pain medicine. Look at the times before you take your next dose. Take other over-the-counter or prescription medicines only as told by your doctor. Keeping yourself and others safe  While you are taking opioids: Do not drive, use machines, or power tools. Do not sign important papers (legal documents). Do not drink alcohol. Do not take sleeping pills. Do not take care of children by yourself. Do not do activities where you need to climb or be in high places, like working on a ladder. Do not go to a lake, river, ocean, swimming pool,  or hot tub. Keep your opioids locked up or in a place where children cannot reach them. Do not share your pain medicine with anyone. Stopping your use of opioids If you have been taking opioids for more than a few weeks, you may need to slowly decrease (taper) how much you take until you stop taking them. Doing this can lower your chance of having symptoms.  Symptoms that come from suddenly stopping the use of opioids include: Pain and cramping in your belly (abdomen). Feeling sick to your stomach (nausea).z Sweating. Feeling very sleepy. Feeling restless. Shaking you cannot control (tremors). Cravings for the medicine. Do not try to stop taking them by yourself. Work with your doctor to stop. Your doctor will help you take less until you are not taking the medicine at all. Getting rid of unused pills Do not save any pills that you did not use. Get rid of the pills by: Taking them to a take-back program in your area. Bringing them to a pharmacy that receives unused pills. Flushing them down the toilet. Check the label or package insert of your medicine to see whether this is safe to do. Throwing them in the trash. Check the label or package insert of your medicine to see whether this is safe to do. If it is safe to throw them out: Take the pills out of their container. Put the pills into a container you can seal. Mix the pills with used coffee grounds, food scraps, dirt, or cat litter. Put this in the trash. Follow these instructions at home: Activity Do exercises as told by your doctor. Avoid doing things that make your pain worse. Return to your normal activities as told by your doctor. Ask your doctor what activities are safe for you. General instructions You may need to take these actions to prevent or treat constipation: Drink enough fluid to keep your pee (urine) pale yellow. Take over-the-counter or prescription medicines. Eat foods that are high in fiber. These include beans,  whole grains, and fresh fruits and vegetables. Limit foods that are high in fat and sugar. These include fried or sweet foods. Keep all follow-up visits. Where to find support If you have been taking opioids for a long time, get help from a local support group or counselor. Ask your doctor about this. Where to find more information Centers for Disease Control and Prevention (CDC): FootballExhibition.com.br [place] Food and Drug Administration (FDA): PumpkinSearch.com.ee Get help right away if: You may have taken too much of an opioid (overdosed). Common symptoms of an overdose: Your breathing is slower or more shallow than normal. You have a very slow heartbeat. Your speech is not normal. You vomit or you feel as if you may vomit. The black centers of your eyes (pupils) are smaller than normal. You have other potential symptoms: You feel very confused. You faint. You are very sleepy. You have cold skin. You have  blue lips or fingernails. You have thoughts of harming yourself or harming others. These symptoms may be an emergency. Get help right away. Call your local emergency services (911 in the [place] Do not wait to see if the symptoms will go away. Do not drive yourself to the hospital. Get help right away if you feel like you may hurt yourself or others, or have thoughts about taking your own life. Go to your nearest emergency room or: Call your local emergency services (911 in the [place] Call the [facility] at (469)511-8466. Call a suicide crisis helpline, such as the National Suicide Prevention Lifeline at (612)442-2371 or 988 in the [place] This is open 24 hours a day. Text the Crisis Text Line at (317)540-1525. Summary Opioid are strong medicines that are used to treat bad or very bad pain. A pain treatment plan is a plan made by you and your doctor. Work with your doctor to make a plan for treating your pain. If you think that you or someone else may have taken too much of an opioid, get  help right away. This information is not intended to replace advice given to you by your health care provider. Make sure you discuss any questions you have with your health care provider. Document Revised: 06/16/2021 Document Reviewed: 03/03/2021 Elsevier Patient Education  2024 [employer]  Advanced directives: (In Chart) A copy of your advanced directives are scanned into your chart should your provider ever need it.  Next Medicare Annual Wellness Visit scheduled for next year: Yes

## 2023-08-15 NOTE — Progress Notes (Signed)
Subjective:   Brandon [name] is a 62 y.o. male who presents for Medicare Annual/Subsequent preventive examination.  Visit Complete: Virtual  I connected with  Brandon [name] on 08/15/23 by a audio enabled telemedicine application and verified that I am speaking with the correct person using two identifiers.  Patient Location: Home  Provider Location: Home Office  I discussed the limitations of evaluation and management by telemedicine. The patient expressed understanding and agreed to proceed.    Review of Systems    Vital Signs: Unable to obtain new vitals due to this being a telehealth visit.  Cardiac Risk Factors include: advanced age (>22men, >25 women);male gender;diabetes mellitus;hypertension     Objective:    Today's Vitals   08/15/23 1439  Weight: 209 lb (94.8 kg)  Height: 6' (1.829 m)   Body mass index is 28.35 kg/m.     08/15/2023    2:50 PM 10/14/2022   11:00 AM 10/03/2022    6:00 AM 10/02/2022    4:27 AM 04/20/2022    7:41 PM 03/18/2022    9:09 AM 02/01/2022    2:38 PM  Advanced Directives  Does Patient Have a Medical Advance Directive? Yes No No No No No No  Type of Estate agent of [place];Living will        Does patient want to make changes to medical advance directive? No - Patient declined        Copy of Healthcare Power of Attorney in Chart? Yes - validated most recent copy scanned in chart (See row information)        Would patient like information on creating a medical advance directive?   No - Patient declined  No - Patient declined No - Patient declined     Current Medications (verified) Outpatient Encounter Medications as of 08/15/2023  Medication Sig   acetaminophen (TYLENOL) 500 MG tablet Take 1 tablet (500 mg total) by mouth every 6 (six) hours as needed.   amLODipine (NORVASC) 10 MG tablet TAKE 2 TABLETS BY MOUTH EVERY DAY   atorvastatin (LIPITOR) 40 MG tablet Take 1 tablet (40 mg total) by mouth daily.   blood  glucose meter kit and supplies Dispense based on patient and insurance preference. Use up to four times daily as directed. (FOR ICD-9 250.00, 250.01). (Patient taking differently: 1 each by Other route See admin instructions. Dispense based on patient and insurance preference. Use up to four times daily as directed. (FOR ICD-9 250.00, 250.01).)   carvedilol (COREG) 25 MG tablet Take 25 mg by mouth 2 (two) times daily.   ezetimibe (ZETIA) 10 MG tablet Take 1 tablet (10 mg total) by mouth daily.   ferrous sulfate 325 (65 FE) MG tablet Take 1 tablet (325 mg total) by mouth daily with breakfast. (Patient not taking: Reported on 01/25/2023)   folic acid (FOLVITE) 1 MG tablet Take 1 tablet (1 mg total) by mouth daily.   glucose blood (ONETOUCH ULTRA) test strip Blood sugar testing done TID for insulin dependant Type @ diabetes - E11.65   HYDROcodone-acetaminophen (NORCO) 5-325 MG tablet Take 1 tablet by mouth every 6 (six) hours as needed for moderate pain.   isosorbide mononitrate (IMDUR) 30 MG 24 hr tablet Take 1 tablet (30 mg total) by mouth daily.   Lancets (ACCU-CHEK SOFT TOUCH) lancets Use as instructed (Patient taking differently: 1 each by Other route as directed.)   levETIRAcetam (KEPPRA) 500 MG tablet Take 1 tablet (500 mg total) by mouth 2 (two) times  daily.   multivitamin (RENA-VIT) TABS tablet Take 1 tablet by mouth at bedtime.   pantoprazole (PROTONIX) 40 MG tablet Take 1 tablet (40 mg total) by mouth daily. (Patient not taking: Reported on 01/25/2023)   sertraline (ZOLOFT) 100 MG tablet TAKE 1 TABLET BY MOUTH EVERY DAY   No facility-administered encounter medications on file as of 08/15/2023.    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; cryoablation bil   Depression    BH Adm in [place] Depression   Diabetes mellitus    DKA prior hospitalization   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 ischemic stroke   GERD (gastroesophageal reflux disease)    Hiatal hernia    Hyperlipidemia    Hypertension    Seizures (HCC)    none since 2016, taking Keppra - maw   Sleep apnea    Thyroid disease    "weak thyroid" per MD   Wears glasses    Past Surgical History:  Procedure Laterality Date   AV FISTULA PLACEMENT Left 10/14/2022   Procedure: LEFT BRACHIOCAPHALIC FISTULA CREATION;  Surgeon: Cephus Shelling, MD;  Location: [facility] OR;  Service: Vascular;  Laterality: Left;   BREAST BIOPSY Left 03/24/2023   [place] LT BREAST BX W LOC DEV 1ST LESION IMG BX SPEC US GUIDE 03/24/2023 [facility]   COLONOSCOPY     IR FLUORO GUIDE CV LINE RIGHT  10/06/2022   IR RADIOLOGIST EVAL & MGMT  04/04/2017   IR US GUIDE VASC ACCESS RIGHT  10/06/2022   KIDNEY SURGERY     ablation of renal cell CA - 12/28, prior one was October 2012-Dr. Yamagata   TEE WITHOUT CARDIOVERSION N/A 11/19/2013   Procedure: TRANSESOPHAGEAL ECHOCARDIOGRAM (TEE);  Surgeon: Lewayne Bunting, MD;  Location: [facility];  Service: Cardiovascular;  Laterality: N/A;   Family History  Problem Relation Age of Onset   Pneumonia Mother    Breast cancer Mother    Diabetes Father    Heart disease Father    Breast cancer Sister    Prostate cancer Other    Stomach cancer Other    Colon cancer Neg Hx    Esophageal cancer Neg Hx    Rectal cancer Neg Hx    Social History   Socioeconomic History   Marital status: Single    Spouse name: Not on file   Number of children: 2   Years of education: college   Highest education level: Not on file  Occupational History   Occupation: disabled  Tobacco Use   Smoking status: Never     Passive exposure: Never   Smokeless tobacco: Never  Vaping Use   Vaping status: Never Used  Substance and Sexual Activity   Alcohol use: Not Currently    Comment: rarely   Drug use: No   Sexual activity: Never  Other Topics Concern   Not on file  Social History Narrative   Patient lives alone.   Education. Two years of college.   Not working.   Right handed.   Caffeine one soda daily. Drinks  coffee    Social Determinants of Health   Financial Resource Strain: Low Risk  (08/15/2023)   Overall Financial Resource Strain (CARDIA)    Difficulty of Paying Living Expenses: Not hard at all  Food Insecurity: No Food Insecurity (08/15/2023)   Hunger Vital Sign    Worried About Running Out of Food in the Last Year: Never true    Ran Out of Food in the Last Year: Never true  Transportation Needs: No Transportation Needs (08/15/2023)   PRAPARE - Administrator, Civil Service (Medical): No    Lack of Transportation (Non-Medical): No  Physical Activity: Sufficiently Active (08/15/2023)   Exercise Vital Sign    Days of Exercise per Week: 3 days    Minutes of Exercise per Session: 60 min  Stress: No Stress Concern Present (08/15/2023)   [employer] of Occupational Health - Occupational Stress Questionnaire    Feeling of Stress : Not at all  Social Connections: Moderately Integrated (08/15/2023)   Social Connection and Isolation Panel [NHANES]    Frequency of Communication with Friends and Family: More than three times a week    Frequency of Social Gatherings with Friends and Family: More than three times a week    Attends Religious Services: More than 4 times per year    Active Member of [employer] or Organizations: Yes    Attends Engineer, structural: More than 4 times per year    Marital Status: Never married    Tobacco Counseling Counseling given: Not Answered   Clinical Intake:  Pre-visit preparation completed: Yes  Pain : No/denies pain     BMI -  recorded: 25.35 Nutritional Status: BMI 25 -29 Overweight Nutritional Risks: None Diabetes: Yes CBG done?: Yes CBG resulted in Enter/ Edit results?: Yes (CBG 103 Per patient) Did pt. bring in CBG monitor from home?: No  How often do you need to have someone help you when you read instructions, pamphlets, or other written materials from your doctor or pharmacy?: 3 - Sometimes (Friends assist)  Interpreter Needed?: No  Information entered by :: Theresa Mulligan LPN   Activities of Daily Living    08/15/2023    2:47 PM 11/09/2022    4:22 PM  In your present state of health, do you have any difficulty performing the following activities:  Hearing? 0 1  Vision? 0 1  Difficulty concentrating or making decisions? 0 1  Walking or climbing stairs? 1 1  Comment Uses a cane   Dressing or bathing? 0 1  Doing errands, shopping? 0 1  Preparing Food and eating ? N   Using the Toilet? N   In the past six months, have you accidently leaked urine? N   Do you have problems with loss of bowel control? N   Managing your Medications? N   Managing your Finances? N   Housekeeping or managing your Housekeeping? N     Patient Care Team: Carlean Jews, NP as PCP - General (Family [facility]) Center, [facility]  Indicate any recent Medical Services you may have received from other than [facility] providers in the past year (date may be approximate).     Assessment:   This is a routine wellness examination for Brandon [name].  Hearing/Vision screen Hearing Screening - Comments:: Denies hearing difficulties   Vision Screening - Comments:: Wears rx glasses - up to date with routine eye exams with  Deferred   Goals Addressed  This Visit's Progress     Get healthier (pt-stated)        I want to get my kidney better.       Depression Screen    08/15/2023    2:45 PM 01/25/2023    2:26 PM 11/09/2022    4:21 PM 08/01/2022    2:52 PM 04/27/2022    2:51 PM 04/06/2022    3:19 PM  01/09/2020   10:42 AM  PHQ 2/9 Scores  PHQ - 2 Score 0 4 2 2  0 2 3  PHQ- 9 Score  22 8 9 5 11 17     Fall Risk    08/15/2023    2:48 PM 04/24/2018    1:13 PM 03/01/2018    9:08 AM 02/02/2017    9:16 AM  Fall Risk   Falls in the past year? 1 Yes Yes Yes  Number falls in past yr: 0 2 or more 2 or more 1  Injury with Fall? 0  Yes No  Comment   hit my left side   Risk for fall due to : No Fall Risks  Impaired balance/gait   Follow up Falls prevention discussed       MEDICARE RISK AT HOME: Medicare Risk at Home Any stairs in or around the home?: No If so, are there any without handrails?: No Home free of loose throw rugs in walkways, pet beds, electrical cords, etc?: Yes Adequate lighting in your home to reduce risk of falls?: Yes Life alert?: Yes Use of a cane, walker or w/c?: Yes Grab bars in the bathroom?: Yes Shower chair or bench in shower?: No Elevated toilet seat or a handicapped toilet?: Yes  TIMED UP AND GO:  Was the test performed?  No    Cognitive Function:        08/15/2023    2:54 PM 08/15/2023    2:50 PM 08/01/2022    2:35 PM  6CIT Screen  What Year? 0 points 0 points 0 points  What month? 0 points 0 points 0 points  What time? 0 points 0 points 0 points  Count back from 20 0 points 0 points 0 points  Months in reverse 4 points 0 points 0 points  Repeat phrase 2 points 0 points 4 points  Total Score 6 points 0 points 4 points    Immunizations Immunization History  Administered Date(s) Administered   Influenza,inj,Quad PF,6+ Mos 11/18/2013, 07/29/2014, 10/19/2015, 08/15/2016, 02/05/2018, 12/24/2020, 10/19/2021, 10/06/2022   Moderna SARS-COV2 Booster Vaccination 07/07/2021   PFIZER(Purple Top)SARS-COV-2 Vaccination 03/14/2020, 04/21/2020, 12/24/2020   PNEUMOCOCCAL CONJUGATE-20 08/01/2022   Pneumococcal Polysaccharide-23 08/15/2016   Td 10/19/2021   Tdap 08/15/2016    TDAP status: Up to date  Flu Vaccine status: Due, Education has been provided  regarding the importance of this vaccine. Advised may receive this vaccine at local pharmacy or Health Dept. Aware to provide a copy of the vaccination record if obtained from local pharmacy or Health Dept. Verbalized acceptance and understanding.    Covid-19 vaccine status: Declined, Education has been provided regarding the importance of this vaccine but patient still declined. Advised may receive this vaccine at local pharmacy or Health Dept.or vaccine clinic. Aware to provide a copy of the vaccination record if obtained from local pharmacy or Health Dept. Verbalized acceptance and understanding.  Qualifies for Shingles Vaccine? Yes   Zostavax completed No   Shingrix Completed?: No.    Education has been provided regarding the importance of this vaccine. Patient has been advised to  call insurance company to determine out of pocket expense if they have not yet received this vaccine. Advised may also receive vaccine at local pharmacy or Health Dept. Verbalized acceptance and understanding.  Screening Tests Health Maintenance  Topic Date Due   Hepatitis C Screening  Never done   Zoster Vaccines- Shingrix (1 of 2) Never done   FOOT EXAM  10/19/2022   OPHTHALMOLOGY EXAM  04/27/2023   HEMOGLOBIN A1C  05/11/2023   INFLUENZA VACCINE  07/06/2023   COVID-19 Vaccine (4 - 2023-24 season) 08/06/2023   Medicare Annual Wellness (AWV)  08/14/2024   Colonoscopy  06/16/2027   DTaP/Tdap/Td (3 - Td or Tdap) 10/20/2031   HIV Screening  Completed   HPV VACCINES  Aged Out    Health Maintenance  Health Maintenance Due  Topic Date Due   Hepatitis C Screening  Never done   Zoster Vaccines- Shingrix (1 of 2) Never done   FOOT EXAM  10/19/2022   OPHTHALMOLOGY EXAM  04/27/2023   HEMOGLOBIN A1C  05/11/2023   INFLUENZA VACCINE  07/06/2023   COVID-19 Vaccine (4 - 2023-24 season) 08/06/2023    Colorectal cancer screening: Type of screening: Colonoscopy. Completed 06/15/17. Repeat every 10  years   Additional Screening:  Hepatitis C Screening: does qualify; Deferred  Vision Screening: Recommended annual ophthalmology exams for early detection of glaucoma and other disorders of the eye. Is the patient up to date with their annual eye exam?  Yes  Who is the provider or what is the name of the office in which the patient attends annual eye exams? Deferred If pt is not established with a provider, would they like to be referred to a provider to establish care? No .   Dental Screening: Recommended annual dental exams for proper oral hygiene  Diabetic Foot Exam: Diabetic Foot Exam: Overdue, Pt has been advised about the importance in completing this exam. Pt is scheduled for diabetic foot exam on Deferred.  Community Resource Referral / Chronic Care Management:  CRR required this visit?  No   CCM required this visit?  No     Plan:     I have personally reviewed and noted the following in the patient's chart:   Medical and social history Use of alcohol, tobacco or illicit drugs  Current medications and supplements including opioid prescriptions. Patient is currently taking opioid prescriptions. Information provided to patient regarding non-opioid alternatives. Patient advised to discuss non-opioid treatment plan with their provider. Functional ability and status Nutritional status Physical activity Advanced directives List of other physicians Hospitalizations, surgeries, and ER visits in previous 12 months Vitals Screenings to include cognitive, depression, and falls Referrals and appointments  In addition, I have reviewed and discussed with patient certain preventive protocols, quality metrics, and best practice recommendations. A written personalized care plan for preventive services as well as general preventive health recommendations were provided to patient.     Tillie Rung, LPN   [id number]   After Visit Summary: (MyChart) Due to this being a  telephonic visit, the after visit summary with patients personalized plan was offered to patient via MyChart   Nurse Notes: None

## 2023-08-17 DIAGNOSIS — D631 Anemia in chronic kidney disease: Secondary | ICD-10-CM | POA: Diagnosis not present

## 2023-08-17 DIAGNOSIS — N186 End stage renal disease: Secondary | ICD-10-CM | POA: Diagnosis not present

## 2023-08-17 DIAGNOSIS — Z992 Dependence on renal dialysis: Secondary | ICD-10-CM | POA: Diagnosis not present

## 2023-08-17 DIAGNOSIS — N2581 Secondary hyperparathyroidism of renal origin: Secondary | ICD-10-CM | POA: Diagnosis not present

## 2023-08-19 DIAGNOSIS — D631 Anemia in chronic kidney disease: Secondary | ICD-10-CM | POA: Diagnosis not present

## 2023-08-19 DIAGNOSIS — N2581 Secondary hyperparathyroidism of renal origin: Secondary | ICD-10-CM | POA: Diagnosis not present

## 2023-08-19 DIAGNOSIS — N186 End stage renal disease: Secondary | ICD-10-CM | POA: Diagnosis not present

## 2023-08-19 DIAGNOSIS — Z992 Dependence on renal dialysis: Secondary | ICD-10-CM | POA: Diagnosis not present

## 2023-08-22 DIAGNOSIS — N186 End stage renal disease: Secondary | ICD-10-CM | POA: Diagnosis not present

## 2023-08-22 DIAGNOSIS — Z992 Dependence on renal dialysis: Secondary | ICD-10-CM | POA: Diagnosis not present

## 2023-08-22 DIAGNOSIS — N2581 Secondary hyperparathyroidism of renal origin: Secondary | ICD-10-CM | POA: Diagnosis not present

## 2023-08-22 DIAGNOSIS — D631 Anemia in chronic kidney disease: Secondary | ICD-10-CM | POA: Diagnosis not present

## 2023-08-24 DIAGNOSIS — N2581 Secondary hyperparathyroidism of renal origin: Secondary | ICD-10-CM | POA: Diagnosis not present

## 2023-08-24 DIAGNOSIS — D631 Anemia in chronic kidney disease: Secondary | ICD-10-CM | POA: Diagnosis not present

## 2023-08-24 DIAGNOSIS — Z992 Dependence on renal dialysis: Secondary | ICD-10-CM | POA: Diagnosis not present

## 2023-08-24 DIAGNOSIS — N186 End stage renal disease: Secondary | ICD-10-CM | POA: Diagnosis not present

## 2023-08-26 DIAGNOSIS — N186 End stage renal disease: Secondary | ICD-10-CM | POA: Diagnosis not present

## 2023-08-26 DIAGNOSIS — Z992 Dependence on renal dialysis: Secondary | ICD-10-CM | POA: Diagnosis not present

## 2023-08-26 DIAGNOSIS — D631 Anemia in chronic kidney disease: Secondary | ICD-10-CM | POA: Diagnosis not present

## 2023-08-26 DIAGNOSIS — N2581 Secondary hyperparathyroidism of renal origin: Secondary | ICD-10-CM | POA: Diagnosis not present

## 2023-08-29 DIAGNOSIS — D631 Anemia in chronic kidney disease: Secondary | ICD-10-CM | POA: Diagnosis not present

## 2023-08-29 DIAGNOSIS — N186 End stage renal disease: Secondary | ICD-10-CM | POA: Diagnosis not present

## 2023-08-29 DIAGNOSIS — N2581 Secondary hyperparathyroidism of renal origin: Secondary | ICD-10-CM | POA: Diagnosis not present

## 2023-08-29 DIAGNOSIS — Z992 Dependence on renal dialysis: Secondary | ICD-10-CM | POA: Diagnosis not present

## 2023-08-31 DIAGNOSIS — D631 Anemia in chronic kidney disease: Secondary | ICD-10-CM | POA: Diagnosis not present

## 2023-08-31 DIAGNOSIS — Z992 Dependence on renal dialysis: Secondary | ICD-10-CM | POA: Diagnosis not present

## 2023-08-31 DIAGNOSIS — N186 End stage renal disease: Secondary | ICD-10-CM | POA: Diagnosis not present

## 2023-08-31 DIAGNOSIS — N2581 Secondary hyperparathyroidism of renal origin: Secondary | ICD-10-CM | POA: Diagnosis not present

## 2023-09-04 DIAGNOSIS — Z23 Encounter for immunization: Secondary | ICD-10-CM | POA: Diagnosis not present

## 2023-09-05 DIAGNOSIS — I129 Hypertensive chronic kidney disease with stage 1 through stage 4 chronic kidney disease, or unspecified chronic kidney disease: Secondary | ICD-10-CM | POA: Diagnosis not present

## 2023-09-05 DIAGNOSIS — N2581 Secondary hyperparathyroidism of renal origin: Secondary | ICD-10-CM | POA: Diagnosis not present

## 2023-09-05 DIAGNOSIS — N186 End stage renal disease: Secondary | ICD-10-CM | POA: Diagnosis not present

## 2023-09-05 DIAGNOSIS — Z992 Dependence on renal dialysis: Secondary | ICD-10-CM | POA: Diagnosis not present

## 2023-09-06 ENCOUNTER — Other Ambulatory Visit: Payer: Self-pay | Admitting: Family Medicine

## 2023-09-06 ENCOUNTER — Encounter: Payer: Self-pay | Admitting: Family Medicine

## 2023-09-06 ENCOUNTER — Ambulatory Visit (INDEPENDENT_AMBULATORY_CARE_PROVIDER_SITE_OTHER): Payer: Medicare Other | Admitting: Family Medicine

## 2023-09-06 VITALS — BP 108/68 | HR 79 | Ht 72.0 in | Wt 209.0 lb

## 2023-09-06 DIAGNOSIS — E1142 Type 2 diabetes mellitus with diabetic polyneuropathy: Secondary | ICD-10-CM

## 2023-09-06 DIAGNOSIS — E1159 Type 2 diabetes mellitus with other circulatory complications: Secondary | ICD-10-CM

## 2023-09-06 DIAGNOSIS — E785 Hyperlipidemia, unspecified: Secondary | ICD-10-CM

## 2023-09-06 DIAGNOSIS — G8929 Other chronic pain: Secondary | ICD-10-CM | POA: Diagnosis not present

## 2023-09-06 DIAGNOSIS — G40909 Epilepsy, unspecified, not intractable, without status epilepticus: Secondary | ICD-10-CM | POA: Diagnosis not present

## 2023-09-06 DIAGNOSIS — F339 Major depressive disorder, recurrent, unspecified: Secondary | ICD-10-CM

## 2023-09-06 DIAGNOSIS — Z794 Long term (current) use of insulin: Secondary | ICD-10-CM

## 2023-09-06 DIAGNOSIS — M545 Low back pain, unspecified: Secondary | ICD-10-CM

## 2023-09-06 DIAGNOSIS — I152 Hypertension secondary to endocrine disorders: Secondary | ICD-10-CM

## 2023-09-06 DIAGNOSIS — E1169 Type 2 diabetes mellitus with other specified complication: Secondary | ICD-10-CM | POA: Diagnosis not present

## 2023-09-06 DIAGNOSIS — E119 Type 2 diabetes mellitus without complications: Secondary | ICD-10-CM | POA: Diagnosis not present

## 2023-09-06 DIAGNOSIS — N186 End stage renal disease: Secondary | ICD-10-CM | POA: Diagnosis not present

## 2023-09-06 DIAGNOSIS — Z992 Dependence on renal dialysis: Secondary | ICD-10-CM

## 2023-09-06 MED ORDER — FREESTYLE LIBRE 3 SENSOR MISC: 3 refills | Status: DC

## 2023-09-06 MED ORDER — OXYCODONE HCL 5 MG PO TABS: 2.5000 mg | ORAL_TABLET | Freq: Every day | ORAL | 0 refills | Status: DC | PRN

## 2023-09-06 NOTE — Progress Notes (Signed)
Established Patient Office Visit  Subjective   Patient ID: Brandon [name], male    DOB: 1961/08/15  Age: 62 y.o. MRN: [id number]  Chief Complaint  Patient presents with   Medical Management of Chronic Issues    HPI Patient complaining of back pain.  Attributes to his surgery for RCC ablation.  States that some days it is unbearable.  Occurs on most days.  Today it is a 7 out of 10.  He sometimes takes Aleve for it but says he was told to avoid ibuprofen.  We discussed medication options for him and how dialysis affects these medications.  DM2-patient states he takes Lantus daily.  States it is prescribed by [place].  The checks his blood sugar with a glucose monitor but does not have a continuous glucose monitor.  We discussed using 1 can help identify hypoglycemic episodes which patient identifies he has had in the past.  Discussed bringing his medications at next visit including his insulin and reassessing his blood sugar with morning continuous glucose monitor data.  ESRD-patient gets his dialysis on Tuesday Thursday Saturday.  We discussed the importance of not missing any appointments.  Patient does not make urine.  Patient states he was told to stop taking Keppra when going on dialysis  Hypertension-patient not taking carvedilol anymore because it is states he made his chest hurt.  He only takes amlodipine currently.  We discussed his HFpEF and hypertension.  Discussed possibly changing back to a lower dose of carvedilol in the future if we need to.  Discussed bringing in his medications at next visit and making sure they are consistent with what we have.  There are some medications in which he states he is taking them but they have not been filled since April.  The ASCVD Risk score (Arnett DK, et al., 2019) failed to calculate for the following reasons:   The patient has a prior MI or stroke diagnosis  Health Maintenance Due  Topic Date Due   Hepatitis C Screening  Never done    Zoster Vaccines- Shingrix (1 of 2) Never done   FOOT EXAM  10/19/2022   OPHTHALMOLOGY EXAM  04/27/2023   HEMOGLOBIN A1C  05/11/2023      Objective:     BP 108/68   Pulse 79   Ht 6' (1.829 m)   Wt 209 lb (94.8 kg)   SpO2 100%   BMI 28.35 kg/m    Physical Exam General: Alert, oriented.  Accompanied by partner. CV: Regular rhythm Pulmonary: Lungs clear bilaterally MSK: Tenderness palpation over the left and right flank. Psych: Pleasant, slowed speech.  Occasional stutter.   No results found for any visits on 09/06/23.      Assessment & Plan:   Hypertension associated with diabetes [facility]) Assessment & Plan: Patient only taking amlodipine, no longer taking carvedilol due to the side effect of chest discomfort.  We discussed carvedilol use in the context of blood pressure but also heart failure.  Advised patient if we need to in the future we can restart carvedilol at a lower dose.  Blood pressure at goal today.   Seizure disorder [facility]) Assessment & Plan: Pt no longer taking keppra.    Hyperlipidemia associated with type 2 diabetes mellitus (HCC) -     Lipid panel  Hemodialysis status (HCC)  End stage renal disease [facility]) Assessment & Plan: Patient has dialysis Tuesday Thursday Saturday.  Advised him on the importance of not skipping dialysis days.   Depression, recurrent (HCC)  Type 2 diabetes mellitus with diabetic polyneuropathy, with long-term current use of insulin (HCC) Assessment & Plan: Patient endorses using long-acting Lantus 40 units every morning.  Endorses occasional hypoglycemic readings.  We discussed continuous glucose monitoring.  Discussed how dialysis affects exogenous insulin.  Cautioned patient on hypoglycemia.  Will order freestyle libre, discussed with patient and his partner on how to administer it.  Advised him to reach out to [place] if they have trouble using it.  Will discuss with them the results at their visit next month.  Will get A1c and  fructosamine today given his ESRD level.  Previously had been using A1c only.  Orders: -     Hemoglobin A1c -     Fructosamine -     FreeStyle Libre 3 Sensor; Apply new sensor to back of upper arm every 14 days.  Dispense: 1 each; Refill: 3  Chronic bilateral low back pain without sciatica Assessment & Plan: Patient complaining of chronic bilateral flank pain which she describes as sometimes "unbearable".  Today pain is 7 out of 10.  Takes Tylenol and Aleve occasionally but states he has been told not to take NSAIDs such as ibuprofen due to his kidney disease.  Discussed medications that are safe in ESRD patients and how dose adjustments need to be made. - 2.5 mg oxycodone daily as needed - Follow-up in 1 month.  Orders: -     oxyCODONE HCl; Take 0.5 tablets (2.5 mg total) by mouth daily as needed for severe pain.  Dispense: 15 tablet; Refill: 0     Return in about 4 weeks (around 10/04/2023) for Pain, medication reconciliation.    Sandre Kitty, MD

## 2023-09-06 NOTE — Assessment & Plan Note (Signed)
Patient has dialysis Tuesday Thursday Saturday.  Advised him on the importance of not skipping dialysis days.

## 2023-09-06 NOTE — Assessment & Plan Note (Signed)
Pt no longer taking keppra.

## 2023-09-06 NOTE — Assessment & Plan Note (Signed)
Patient complaining of chronic bilateral flank pain which she describes as sometimes "unbearable".  Today pain is 7 out of 10.  Takes Tylenol and Aleve occasionally but states he has been told not to take NSAIDs such as ibuprofen due to his kidney disease.  Discussed medications that are safe in ESRD patients and how dose adjustments need to be made. - 2.5 mg oxycodone daily as needed - Follow-up in 1 month.

## 2023-09-06 NOTE — Assessment & Plan Note (Signed)
Patient endorses using long-acting Lantus 40 units every morning.  Endorses occasional hypoglycemic readings.  We discussed continuous glucose monitoring.  Discussed how dialysis affects exogenous insulin.  Cautioned patient on hypoglycemia.  Will order freestyle libre, discussed with patient and his partner on how to administer it.  Advised him to reach out to [place] if they have trouble using it.  Will discuss with them the results at their visit next month.  Will get A1c and fructosamine today given his ESRD level.  Previously had been using A1c only.

## 2023-09-06 NOTE — Assessment & Plan Note (Signed)
Patient only taking amlodipine, no longer taking carvedilol due to the side effect of chest discomfort.  We discussed carvedilol use in the context of blood pressure but also heart failure.  Advised patient if we need to in the future we can restart carvedilol at a lower dose.  Blood pressure at goal today.

## 2023-09-06 NOTE — Patient Instructions (Signed)
It was nice to see you today,  We addressed the following topics today: -I will order a continuous glucose monitor for you.  If you need help setting this up to let us know. - I will prescribe 2.5 mg of oxycodone to use daily.  When we saw him follow-up in 1 month to see if this is helping with your pain - I would like you to come back in 1 month with all of your medications so that we can sort through them to make sure we have an accurate accounting of your medication.  Have a great day,  Frederic Jericho, MD

## 2023-09-07 ENCOUNTER — Telehealth: Payer: Self-pay | Admitting: *Deleted

## 2023-09-07 DIAGNOSIS — N186 End stage renal disease: Secondary | ICD-10-CM | POA: Diagnosis not present

## 2023-09-07 DIAGNOSIS — Z992 Dependence on renal dialysis: Secondary | ICD-10-CM | POA: Diagnosis not present

## 2023-09-07 DIAGNOSIS — E1142 Type 2 diabetes mellitus with diabetic polyneuropathy: Secondary | ICD-10-CM

## 2023-09-07 DIAGNOSIS — N2581 Secondary hyperparathyroidism of renal origin: Secondary | ICD-10-CM | POA: Diagnosis not present

## 2023-09-07 LAB — HEMOGLOBIN A1C
Est. average glucose Bld gHb Est-mCnc: 203 mg/dL
Hgb A1c MFr Bld: 8.7 % — ABNORMAL HIGH (ref 4.8–5.6)

## 2023-09-07 LAB — LIPID PANEL
Chol/HDL Ratio: 3.9 {ratio} (ref 0.0–5.0)
Cholesterol, Total: 142 mg/dL (ref 100–199)
HDL: 36 mg/dL — ABNORMAL LOW (ref >39)
LDL Chol Calc (NIH): 89 mg/dL (ref 0–99)
Triglycerides: 88 mg/dL (ref 0–149)
VLDL Cholesterol Cal: 17 mg/dL (ref 5–40)

## 2023-09-07 LAB — FRUCTOSAMINE: Fructosamine: 583 umol/L — ABNORMAL HIGH (ref 0–285)

## 2023-09-07 NOTE — Telephone Encounter (Signed)
Pt calling stating that they were not able to get medications.  Contacted pharmacy and they stated that the oxycodone would need a PA which they have started and sent to [place], also stated that the free style Josephine Igo needs to be written for 2 a month.

## 2023-09-08 NOTE — Telephone Encounter (Signed)
Provider is advised of this matter

## 2023-09-09 DIAGNOSIS — N186 End stage renal disease: Secondary | ICD-10-CM | POA: Diagnosis not present

## 2023-09-09 DIAGNOSIS — Z992 Dependence on renal dialysis: Secondary | ICD-10-CM | POA: Diagnosis not present

## 2023-09-09 DIAGNOSIS — N2581 Secondary hyperparathyroidism of renal origin: Secondary | ICD-10-CM | POA: Diagnosis not present

## 2023-09-12 ENCOUNTER — Other Ambulatory Visit: Payer: Self-pay | Admitting: Family Medicine

## 2023-09-12 DIAGNOSIS — E1142 Type 2 diabetes mellitus with diabetic polyneuropathy: Secondary | ICD-10-CM

## 2023-09-12 MED ORDER — FREESTYLE LIBRE 3 SENSOR MISC: 3 refills | Status: DC

## 2023-09-12 NOTE — Telephone Encounter (Signed)
I have resent the freestyle as 2 each per pharmacy request.  I have not received any paperwork regarding a prior authorization for oxycodone.

## 2023-09-12 NOTE — Telephone Encounter (Signed)
Provider is advised 

## 2023-09-12 NOTE — Telephone Encounter (Signed)
Pt calling to check on the status of the PA for the oxycodone and then resending the Freestyle libre for 2 a month so insurance only bills once monthly, will route to clinical and provider about this.

## 2023-09-14 DIAGNOSIS — N186 End stage renal disease: Secondary | ICD-10-CM | POA: Diagnosis not present

## 2023-09-14 DIAGNOSIS — Z992 Dependence on renal dialysis: Secondary | ICD-10-CM | POA: Diagnosis not present

## 2023-09-14 DIAGNOSIS — N2581 Secondary hyperparathyroidism of renal origin: Secondary | ICD-10-CM | POA: Diagnosis not present

## 2023-09-16 DIAGNOSIS — N186 End stage renal disease: Secondary | ICD-10-CM | POA: Diagnosis not present

## 2023-09-16 DIAGNOSIS — Z992 Dependence on renal dialysis: Secondary | ICD-10-CM | POA: Diagnosis not present

## 2023-09-16 DIAGNOSIS — N2581 Secondary hyperparathyroidism of renal origin: Secondary | ICD-10-CM | POA: Diagnosis not present

## 2023-09-18 ENCOUNTER — Other Ambulatory Visit: Payer: Self-pay | Admitting: Family Medicine

## 2023-09-18 DIAGNOSIS — M545 Low back pain, unspecified: Secondary | ICD-10-CM

## 2023-09-18 MED ORDER — OXYCODONE HCL 5 MG PO TABS: 2.5000 mg | ORAL_TABLET | Freq: Every day | ORAL | 0 refills | Status: DC | PRN

## 2023-09-18 NOTE — Telephone Encounter (Signed)
Prescription was resent to [facility].

## 2023-09-18 NOTE — Telephone Encounter (Signed)
Pt calling to say that they still have not gotten the Rx for the oxycodone.  I contacted pharmacy and the gentleman I spoke with said that they do not see this on patients profile.  Please resend this medication and let me know once complete and I will confirm that they received it.

## 2023-09-19 DIAGNOSIS — N2581 Secondary hyperparathyroidism of renal origin: Secondary | ICD-10-CM | POA: Diagnosis not present

## 2023-09-19 DIAGNOSIS — N186 End stage renal disease: Secondary | ICD-10-CM | POA: Diagnosis not present

## 2023-09-19 DIAGNOSIS — Z992 Dependence on renal dialysis: Secondary | ICD-10-CM | POA: Diagnosis not present

## 2023-09-21 DIAGNOSIS — N2581 Secondary hyperparathyroidism of renal origin: Secondary | ICD-10-CM | POA: Diagnosis not present

## 2023-09-21 DIAGNOSIS — Z992 Dependence on renal dialysis: Secondary | ICD-10-CM | POA: Diagnosis not present

## 2023-09-21 DIAGNOSIS — N186 End stage renal disease: Secondary | ICD-10-CM | POA: Diagnosis not present

## 2023-09-22 DIAGNOSIS — N2581 Secondary hyperparathyroidism of renal origin: Secondary | ICD-10-CM | POA: Diagnosis not present

## 2023-09-22 DIAGNOSIS — N186 End stage renal disease: Secondary | ICD-10-CM | POA: Diagnosis not present

## 2023-09-22 DIAGNOSIS — Z992 Dependence on renal dialysis: Secondary | ICD-10-CM | POA: Diagnosis not present

## 2023-09-25 ENCOUNTER — Telehealth: Payer: Self-pay | Admitting: *Deleted

## 2023-09-25 NOTE — Telephone Encounter (Signed)
Pt calling to check on the status of the oxycodone refill, contacted pharmacy and they stated that they have sent over a request for a PA for this medication.  Please contact patient with any updates.  I have updated him with this information. Routing to PCP and staff to see next steps.

## 2023-09-26 DIAGNOSIS — N2581 Secondary hyperparathyroidism of renal origin: Secondary | ICD-10-CM | POA: Diagnosis not present

## 2023-09-26 DIAGNOSIS — N186 End stage renal disease: Secondary | ICD-10-CM | POA: Diagnosis not present

## 2023-09-26 DIAGNOSIS — Z992 Dependence on renal dialysis: Secondary | ICD-10-CM | POA: Diagnosis not present

## 2023-09-27 NOTE — Telephone Encounter (Signed)
Called pt LVM stating that there  was an insurance issue contacted [facility] for the right insurance information will be faxing off today

## 2023-09-28 DIAGNOSIS — N2581 Secondary hyperparathyroidism of renal origin: Secondary | ICD-10-CM | POA: Diagnosis not present

## 2023-09-28 DIAGNOSIS — N186 End stage renal disease: Secondary | ICD-10-CM | POA: Diagnosis not present

## 2023-09-28 DIAGNOSIS — Z992 Dependence on renal dialysis: Secondary | ICD-10-CM | POA: Diagnosis not present

## 2023-09-30 DIAGNOSIS — N186 End stage renal disease: Secondary | ICD-10-CM | POA: Diagnosis not present

## 2023-09-30 DIAGNOSIS — N2581 Secondary hyperparathyroidism of renal origin: Secondary | ICD-10-CM | POA: Diagnosis not present

## 2023-09-30 DIAGNOSIS — Z992 Dependence on renal dialysis: Secondary | ICD-10-CM | POA: Diagnosis not present

## 2023-10-03 ENCOUNTER — Other Ambulatory Visit: Payer: Self-pay | Admitting: Family Medicine

## 2023-10-03 DIAGNOSIS — F339 Major depressive disorder, recurrent, unspecified: Secondary | ICD-10-CM

## 2023-10-03 DIAGNOSIS — N2581 Secondary hyperparathyroidism of renal origin: Secondary | ICD-10-CM | POA: Diagnosis not present

## 2023-10-03 DIAGNOSIS — Z992 Dependence on renal dialysis: Secondary | ICD-10-CM | POA: Diagnosis not present

## 2023-10-03 DIAGNOSIS — N186 End stage renal disease: Secondary | ICD-10-CM | POA: Diagnosis not present

## 2023-10-04 ENCOUNTER — Ambulatory Visit: Payer: Medicare Other | Admitting: Family Medicine

## 2023-10-04 NOTE — Progress Notes (Deleted)
Established Patient Office Visit  Subjective   Patient ID: Brandon [name], male    DOB: 02-Jun-1961  Age: 62 y.o. MRN: [id number]  No chief complaint on file.   HPI  Has not had his pain medicine yet?  DM2-Freestyle libre?  Libre link.  Foot exam, Optho exam   The ASCVD Risk score (Arnett DK, et al., 2019) failed to calculate for the following reasons:   The patient has a prior MI or stroke diagnosis  Health Maintenance Due  Topic Date Due   Hepatitis C Screening  Never done   Zoster Vaccines- Shingrix (1 of 2) Never done   FOOT EXAM  10/19/2022   OPHTHALMOLOGY EXAM  04/27/2023      Objective:     There were no vitals taken for this visit. {Vitals History (Optional):23777}  Physical Exam   No results found for any visits on 10/04/23.      Assessment & Plan:   There are no diagnoses linked to this encounter.   No follow-ups on file.    Sandre Kitty, MD

## 2023-10-05 ENCOUNTER — Encounter: Payer: Self-pay | Admitting: Family Medicine

## 2023-10-05 ENCOUNTER — Ambulatory Visit (INDEPENDENT_AMBULATORY_CARE_PROVIDER_SITE_OTHER): Payer: Medicare Other | Admitting: Family Medicine

## 2023-10-05 VITALS — BP 103/63 | HR 84 | Ht 72.0 in | Wt 200.8 lb

## 2023-10-05 DIAGNOSIS — N2581 Secondary hyperparathyroidism of renal origin: Secondary | ICD-10-CM | POA: Diagnosis not present

## 2023-10-05 DIAGNOSIS — Z992 Dependence on renal dialysis: Secondary | ICD-10-CM | POA: Diagnosis not present

## 2023-10-05 DIAGNOSIS — L2989 Other pruritus: Secondary | ICD-10-CM | POA: Diagnosis not present

## 2023-10-05 DIAGNOSIS — Z794 Long term (current) use of insulin: Secondary | ICD-10-CM

## 2023-10-05 DIAGNOSIS — E1142 Type 2 diabetes mellitus with diabetic polyneuropathy: Secondary | ICD-10-CM

## 2023-10-05 DIAGNOSIS — E118 Type 2 diabetes mellitus with unspecified complications: Secondary | ICD-10-CM | POA: Diagnosis not present

## 2023-10-05 DIAGNOSIS — B351 Tinea unguium: Secondary | ICD-10-CM | POA: Diagnosis not present

## 2023-10-05 DIAGNOSIS — N186 End stage renal disease: Secondary | ICD-10-CM | POA: Diagnosis not present

## 2023-10-05 MED ORDER — DEXCOM G7 SENSOR MISC: 3 refills | Status: DC

## 2023-10-05 NOTE — Assessment & Plan Note (Signed)
Complains mostly during dialysis.  Advised to use topical treatments.  No skin lesions to suggest calciphylaxis.

## 2023-10-05 NOTE — Assessment & Plan Note (Addendum)
Does not have his freestyle libre.  Appears it was not covered.  Will send in Dexcom.  If still not covered will refer to case management, as he should have some coverage for this given his insulin use.  Will refer to podiatry for management of onychomycosis

## 2023-10-05 NOTE — Patient Instructions (Signed)
It was nice to see you today,  We addressed the following topics today: -You can take over-the-counter melatonin to help you sleep.  Take 4 to 5 mg 30 minutes before bed - I will send in a Dexcom instead of the freestyle libre.  If you have issues paying for this let us know and we will send in a referral to a case manager - You can use topical treatments for your pain without worry of side effects caused by your kidney disease. - I will send in a referral to the podiatrist for a foot evaluation and trimming of your nails.  Have a great day,  Frederic Jericho, MD

## 2023-10-05 NOTE — Progress Notes (Signed)
Established Patient Office Visit  Subjective   Patient ID: Brandon [name], male    DOB: 07/15/1961  Age: 62 y.o. MRN: [id number]  Chief Complaint  Patient presents with   Medical Management of Chronic Issues    HPI Patient has not had his pain medication due to issues with insurance.  Talked about alternatives to oral pain medications.  Patient does not currently use topical treatments.  Recommended patient try topicals.  Recommended trying a TENS unit.  Patient thinks he has used a TENS unit 1 time when he saw a pain specialist.  Patient has not picked up the freestyle libre only because it was going to cost $90.  Discussed sending in the Dexcom instead and seeing if it is covered  Patient complains of itching, pruritus in his lower extremities when he does dialysis.  He believes it is due to the vitamin D they use.  The ASCVD Risk score (Arnett DK, et al., 2019) failed to calculate for the following reasons:   The patient has a prior MI or stroke diagnosis  Health Maintenance Due  Topic Date Due   Hepatitis C Screening  Never done   Zoster Vaccines- Shingrix (1 of 2) Never done   FOOT EXAM  10/19/2022   OPHTHALMOLOGY EXAM  04/27/2023      Objective:     BP 103/63   Pulse 84   Ht 6' (1.829 m)   Wt 200 lb 12.8 oz (91.1 kg)   SpO2 99%   BMI 27.23 kg/m    Physical Exam General: Alert, oriented Extremities: Thickened toenails.  Right second toenail is discolored.  No evidence of infection of the toe.  DP pulses present bilaterally.  Decreased sensation on the left foot. Psych: Pleasant.  Looks down for most of the encounter.   No results found for any visits on 10/05/23.      Assessment & Plan:   Type 2 diabetes mellitus with diabetic polyneuropathy, with long-term current use of insulin (HCC) Assessment & Plan: Does not have his freestyle libre.  Appears it was not covered.  Will send in Dexcom.  If still not covered will refer to case management, as he  should have some coverage for this given his insulin use.  Will refer to podiatry for management of onychomycosis    Onychomycosis -     Ambulatory referral to Podiatry  Diabetic foot (HCC) -     Ambulatory referral to Podiatry  Uremic pruritus Assessment & Plan: Complains mostly during dialysis.  Advised to use topical treatments.  No skin lesions to suggest calciphylaxis.   Other orders -     Dexcom G7 Sensor; Apply every 15 days  Dispense: 2 each; Refill: 3     Return in about 4 weeks (around 11/02/2023) for DM.    Sandre Kitty, MD

## 2023-10-06 DIAGNOSIS — I129 Hypertensive chronic kidney disease with stage 1 through stage 4 chronic kidney disease, or unspecified chronic kidney disease: Secondary | ICD-10-CM | POA: Diagnosis not present

## 2023-10-06 DIAGNOSIS — Z992 Dependence on renal dialysis: Secondary | ICD-10-CM | POA: Diagnosis not present

## 2023-10-06 DIAGNOSIS — N186 End stage renal disease: Secondary | ICD-10-CM | POA: Diagnosis not present

## 2023-10-07 DIAGNOSIS — N186 End stage renal disease: Secondary | ICD-10-CM | POA: Diagnosis not present

## 2023-10-07 DIAGNOSIS — N2581 Secondary hyperparathyroidism of renal origin: Secondary | ICD-10-CM | POA: Diagnosis not present

## 2023-10-07 DIAGNOSIS — Z992 Dependence on renal dialysis: Secondary | ICD-10-CM | POA: Diagnosis not present

## 2023-10-10 DIAGNOSIS — Z992 Dependence on renal dialysis: Secondary | ICD-10-CM | POA: Diagnosis not present

## 2023-10-10 DIAGNOSIS — N186 End stage renal disease: Secondary | ICD-10-CM | POA: Diagnosis not present

## 2023-10-10 DIAGNOSIS — N2581 Secondary hyperparathyroidism of renal origin: Secondary | ICD-10-CM | POA: Diagnosis not present

## 2023-10-14 DIAGNOSIS — N186 End stage renal disease: Secondary | ICD-10-CM | POA: Diagnosis not present

## 2023-10-14 DIAGNOSIS — Z992 Dependence on renal dialysis: Secondary | ICD-10-CM | POA: Diagnosis not present

## 2023-10-14 DIAGNOSIS — N2581 Secondary hyperparathyroidism of renal origin: Secondary | ICD-10-CM | POA: Diagnosis not present

## 2023-10-17 DIAGNOSIS — N2581 Secondary hyperparathyroidism of renal origin: Secondary | ICD-10-CM | POA: Diagnosis not present

## 2023-10-17 DIAGNOSIS — N186 End stage renal disease: Secondary | ICD-10-CM | POA: Diagnosis not present

## 2023-10-17 DIAGNOSIS — Z992 Dependence on renal dialysis: Secondary | ICD-10-CM | POA: Diagnosis not present

## 2023-10-18 ENCOUNTER — Ambulatory Visit (INDEPENDENT_AMBULATORY_CARE_PROVIDER_SITE_OTHER): Payer: Medicare Other | Admitting: Podiatry

## 2023-10-18 ENCOUNTER — Encounter: Payer: Self-pay | Admitting: Podiatry

## 2023-10-18 ENCOUNTER — Ambulatory Visit (INDEPENDENT_AMBULATORY_CARE_PROVIDER_SITE_OTHER): Payer: Medicare Other

## 2023-10-18 DIAGNOSIS — L97512 Non-pressure chronic ulcer of other part of right foot with fat layer exposed: Secondary | ICD-10-CM

## 2023-10-18 DIAGNOSIS — M79675 Pain in left toe(s): Secondary | ICD-10-CM

## 2023-10-18 DIAGNOSIS — M79674 Pain in right toe(s): Secondary | ICD-10-CM | POA: Diagnosis not present

## 2023-10-18 DIAGNOSIS — E0843 Diabetes mellitus due to underlying condition with diabetic autonomic (poly)neuropathy: Secondary | ICD-10-CM

## 2023-10-18 DIAGNOSIS — M779 Enthesopathy, unspecified: Secondary | ICD-10-CM

## 2023-10-18 DIAGNOSIS — M7751 Other enthesopathy of right foot: Secondary | ICD-10-CM | POA: Diagnosis not present

## 2023-10-18 DIAGNOSIS — B351 Tinea unguium: Secondary | ICD-10-CM | POA: Diagnosis not present

## 2023-10-18 NOTE — Progress Notes (Signed)
Chief Complaint  Patient presents with   Diabetes    PATIENT STATES THAT HIS DOCTOR WANTS HIM TO GET HIS FEET CHECKED OUT , THEY LOOK ALRIGHT TO HIM BUT HE STATES THAT HE HAS A LITTLE SCRAP ON RF 2ND TOE AND THAT IS THE ONE THE DOCTOR WAS CONCERN ABOUT.   IT HAS BEEN LIKE THIS FOR 4 DAYS . DOCTOR THOUGHT THERE IS NO BLOOD GOING TO RF.     SUBJECTIVE Patient with a history of diabetes mellitus; last A1c was 8.7 on 09/06/2023, presents to office today with his caregiver complaining of elongated, thickened nails that cause pain while ambulating in shoes.  Patient is unable to trim their own nails.  Patient also states that about 1 week ago he was scrubbing his feet and he was overly aggressive to the right second toe.  He did create a superficial abrasion to the right second toe.  He has been applying Neosporin.  Patient is here for further evaluation and treatment.  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 ischemic stroke   GERD (gastroesophageal reflux disease)    Hiatal hernia    Hyperlipidemia    Hypertension    Seizures (HCC)    none since 2016, taking Keppra - maw   Sleep apnea    Thyroid disease    "weak thyroid" per MD   Wears glasses     Allergies  Allergen Reactions   Hydralazine Other (See Comments)    Chest pain if by mouth- can tolerate it via IV   Imdur [Isosorbide Nitrate] Other (See Comments)    headaches   Morphine Itching    RT second toe 10/18/2023  OBJECTIVE General Patient is awake,  alert, and oriented x 3 and in no acute distress. Derm Skin is dry and supple bilateral. Negative open lesions or macerations. Remaining integument unremarkable. Nails are tender, long, thickened and dystrophic with subungual debris, consistent with onychomycosis, 1-5 bilateral. No signs of infection noted. Vasc skin cool to touch.  Unable to palpate pulses.  Concern for PAD Neuro light touch and protective threshold sensation diminished bilaterally.  Musculoskeletal Exam No symptomatic pedal deformities noted bilateral. Muscular strength within normal limits.  ASSESSMENT 1. Diabetes Mellitus w/ peripheral neuropathy 2.  Pain due to onychomycosis of toenails bilateral 3.  Superficial abrasion overlying the DIPJ right second toe  PLAN OF CARE -Patient evaluated today.  Comprehensive diabetic foot exam performed today -Instructed to maintain good pedal hygiene and foot care. Stressed importance of controlling blood sugar.  -Mechanical debridement of nails 1-5 bilaterally performed using a nail nipper. Filed with dremel without incident.  -Order placed for ABIs to establish vascular baseline -Will plan to contact the patient after ABIs are available to discuss results  -Return to clinic as needed    Felecia Shelling, DPM [facility]  Dr. Felecia Shelling, DPM    [address]  [place], [place] [address]                Office [phone]  Fax [phone]

## 2023-10-19 DIAGNOSIS — N186 End stage renal disease: Secondary | ICD-10-CM | POA: Diagnosis not present

## 2023-10-19 DIAGNOSIS — N2581 Secondary hyperparathyroidism of renal origin: Secondary | ICD-10-CM | POA: Diagnosis not present

## 2023-10-19 DIAGNOSIS — Z992 Dependence on renal dialysis: Secondary | ICD-10-CM | POA: Diagnosis not present

## 2023-10-21 DIAGNOSIS — Z992 Dependence on renal dialysis: Secondary | ICD-10-CM | POA: Diagnosis not present

## 2023-10-21 DIAGNOSIS — N186 End stage renal disease: Secondary | ICD-10-CM | POA: Diagnosis not present

## 2023-10-21 DIAGNOSIS — N2581 Secondary hyperparathyroidism of renal origin: Secondary | ICD-10-CM | POA: Diagnosis not present

## 2023-10-24 DIAGNOSIS — N186 End stage renal disease: Secondary | ICD-10-CM | POA: Diagnosis not present

## 2023-10-24 DIAGNOSIS — Z992 Dependence on renal dialysis: Secondary | ICD-10-CM | POA: Diagnosis not present

## 2023-10-24 DIAGNOSIS — N2581 Secondary hyperparathyroidism of renal origin: Secondary | ICD-10-CM | POA: Diagnosis not present

## 2023-10-25 ENCOUNTER — Ambulatory Visit (HOSPITAL_COMMUNITY): Payer: Medicare Other

## 2023-10-26 DIAGNOSIS — N186 End stage renal disease: Secondary | ICD-10-CM | POA: Diagnosis not present

## 2023-10-26 DIAGNOSIS — N2581 Secondary hyperparathyroidism of renal origin: Secondary | ICD-10-CM | POA: Diagnosis not present

## 2023-10-26 DIAGNOSIS — Z992 Dependence on renal dialysis: Secondary | ICD-10-CM | POA: Diagnosis not present

## 2023-10-31 DIAGNOSIS — N186 End stage renal disease: Secondary | ICD-10-CM | POA: Diagnosis not present

## 2023-10-31 DIAGNOSIS — N2581 Secondary hyperparathyroidism of renal origin: Secondary | ICD-10-CM | POA: Diagnosis not present

## 2023-10-31 DIAGNOSIS — Z992 Dependence on renal dialysis: Secondary | ICD-10-CM | POA: Diagnosis not present

## 2023-11-02 DIAGNOSIS — N186 End stage renal disease: Secondary | ICD-10-CM | POA: Diagnosis not present

## 2023-11-02 DIAGNOSIS — Z992 Dependence on renal dialysis: Secondary | ICD-10-CM | POA: Diagnosis not present

## 2023-11-02 DIAGNOSIS — N2581 Secondary hyperparathyroidism of renal origin: Secondary | ICD-10-CM | POA: Diagnosis not present

## 2023-11-04 DIAGNOSIS — N186 End stage renal disease: Secondary | ICD-10-CM | POA: Diagnosis not present

## 2023-11-04 DIAGNOSIS — Z992 Dependence on renal dialysis: Secondary | ICD-10-CM | POA: Diagnosis not present

## 2023-11-04 DIAGNOSIS — N2581 Secondary hyperparathyroidism of renal origin: Secondary | ICD-10-CM | POA: Diagnosis not present

## 2023-11-05 DIAGNOSIS — Z992 Dependence on renal dialysis: Secondary | ICD-10-CM | POA: Diagnosis not present

## 2023-11-05 DIAGNOSIS — I129 Hypertensive chronic kidney disease with stage 1 through stage 4 chronic kidney disease, or unspecified chronic kidney disease: Secondary | ICD-10-CM | POA: Diagnosis not present

## 2023-11-05 DIAGNOSIS — N186 End stage renal disease: Secondary | ICD-10-CM | POA: Diagnosis not present

## 2023-11-08 ENCOUNTER — Ambulatory Visit: Payer: Medicare Other | Admitting: Family Medicine

## 2023-11-08 NOTE — Progress Notes (Unsigned)
   Established Patient Office Visit  Subjective   Patient ID: RYOMA PACKETT, male    DOB: 11-19-1961  Age: 62 y.o. MRN: [id number]  No chief complaint on file.   HPI  DM-Dexcom?   The ASCVD Risk score (Arnett DK, et al., 2019) failed to calculate for the following reasons:   The patient has a prior MI or stroke diagnosis  Health Maintenance Due  Topic Date Due   Hepatitis C Screening  Never done   Zoster Vaccines- Shingrix (1 of 2) Never done   OPHTHALMOLOGY EXAM  04/27/2023   COVID-19 Vaccine (5 - 2023-24 season) 10/30/2023      Objective:     There were no vitals taken for this visit. {Vitals History (Optional):23777}  Physical Exam   No results found for any visits on 11/08/23.      Assessment & Plan:   There are no diagnoses linked to this encounter.   No follow-ups on file.    Sandre Kitty, MD

## 2023-11-11 DIAGNOSIS — Z992 Dependence on renal dialysis: Secondary | ICD-10-CM | POA: Diagnosis not present

## 2023-11-11 DIAGNOSIS — N186 End stage renal disease: Secondary | ICD-10-CM | POA: Diagnosis not present

## 2023-11-11 DIAGNOSIS — N2581 Secondary hyperparathyroidism of renal origin: Secondary | ICD-10-CM | POA: Diagnosis not present

## 2023-11-14 DIAGNOSIS — N186 End stage renal disease: Secondary | ICD-10-CM | POA: Diagnosis not present

## 2023-11-14 DIAGNOSIS — N2581 Secondary hyperparathyroidism of renal origin: Secondary | ICD-10-CM | POA: Diagnosis not present

## 2023-11-14 DIAGNOSIS — Z992 Dependence on renal dialysis: Secondary | ICD-10-CM | POA: Diagnosis not present

## 2023-11-16 DIAGNOSIS — Z992 Dependence on renal dialysis: Secondary | ICD-10-CM | POA: Diagnosis not present

## 2023-11-16 DIAGNOSIS — N2581 Secondary hyperparathyroidism of renal origin: Secondary | ICD-10-CM | POA: Diagnosis not present

## 2023-11-16 DIAGNOSIS — N186 End stage renal disease: Secondary | ICD-10-CM | POA: Diagnosis not present

## 2023-11-18 DIAGNOSIS — Z992 Dependence on renal dialysis: Secondary | ICD-10-CM | POA: Diagnosis not present

## 2023-11-18 DIAGNOSIS — N186 End stage renal disease: Secondary | ICD-10-CM | POA: Diagnosis not present

## 2023-11-18 DIAGNOSIS — N2581 Secondary hyperparathyroidism of renal origin: Secondary | ICD-10-CM | POA: Diagnosis not present

## 2023-11-21 DIAGNOSIS — N2581 Secondary hyperparathyroidism of renal origin: Secondary | ICD-10-CM | POA: Diagnosis not present

## 2023-11-21 DIAGNOSIS — Z992 Dependence on renal dialysis: Secondary | ICD-10-CM | POA: Diagnosis not present

## 2023-11-21 DIAGNOSIS — N186 End stage renal disease: Secondary | ICD-10-CM | POA: Diagnosis not present

## 2023-11-23 DIAGNOSIS — Z992 Dependence on renal dialysis: Secondary | ICD-10-CM | POA: Diagnosis not present

## 2023-11-23 DIAGNOSIS — N186 End stage renal disease: Secondary | ICD-10-CM | POA: Diagnosis not present

## 2023-11-23 DIAGNOSIS — N2581 Secondary hyperparathyroidism of renal origin: Secondary | ICD-10-CM | POA: Diagnosis not present

## 2023-11-25 DIAGNOSIS — Z992 Dependence on renal dialysis: Secondary | ICD-10-CM | POA: Diagnosis not present

## 2023-11-25 DIAGNOSIS — N2581 Secondary hyperparathyroidism of renal origin: Secondary | ICD-10-CM | POA: Diagnosis not present

## 2023-11-25 DIAGNOSIS — N186 End stage renal disease: Secondary | ICD-10-CM | POA: Diagnosis not present

## 2023-11-27 DIAGNOSIS — Z992 Dependence on renal dialysis: Secondary | ICD-10-CM | POA: Diagnosis not present

## 2023-11-27 DIAGNOSIS — N186 End stage renal disease: Secondary | ICD-10-CM | POA: Diagnosis not present

## 2023-11-27 DIAGNOSIS — N2581 Secondary hyperparathyroidism of renal origin: Secondary | ICD-10-CM | POA: Diagnosis not present

## 2023-11-30 DIAGNOSIS — Z992 Dependence on renal dialysis: Secondary | ICD-10-CM | POA: Diagnosis not present

## 2023-11-30 DIAGNOSIS — N2581 Secondary hyperparathyroidism of renal origin: Secondary | ICD-10-CM | POA: Diagnosis not present

## 2023-11-30 DIAGNOSIS — N186 End stage renal disease: Secondary | ICD-10-CM | POA: Diagnosis not present

## 2023-12-02 DIAGNOSIS — N2581 Secondary hyperparathyroidism of renal origin: Secondary | ICD-10-CM | POA: Diagnosis not present

## 2023-12-02 DIAGNOSIS — N186 End stage renal disease: Secondary | ICD-10-CM | POA: Diagnosis not present

## 2023-12-02 DIAGNOSIS — Z992 Dependence on renal dialysis: Secondary | ICD-10-CM | POA: Diagnosis not present

## 2023-12-04 DIAGNOSIS — N186 End stage renal disease: Secondary | ICD-10-CM | POA: Diagnosis not present

## 2023-12-04 DIAGNOSIS — N2581 Secondary hyperparathyroidism of renal origin: Secondary | ICD-10-CM | POA: Diagnosis not present

## 2023-12-04 DIAGNOSIS — Z992 Dependence on renal dialysis: Secondary | ICD-10-CM | POA: Diagnosis not present

## 2023-12-06 DIAGNOSIS — Z992 Dependence on renal dialysis: Secondary | ICD-10-CM | POA: Diagnosis not present

## 2023-12-06 DIAGNOSIS — N186 End stage renal disease: Secondary | ICD-10-CM | POA: Diagnosis not present

## 2023-12-06 DIAGNOSIS — I129 Hypertensive chronic kidney disease with stage 1 through stage 4 chronic kidney disease, or unspecified chronic kidney disease: Secondary | ICD-10-CM | POA: Diagnosis not present

## 2023-12-07 DIAGNOSIS — N2581 Secondary hyperparathyroidism of renal origin: Secondary | ICD-10-CM | POA: Diagnosis not present

## 2023-12-07 DIAGNOSIS — N186 End stage renal disease: Secondary | ICD-10-CM | POA: Diagnosis not present

## 2023-12-07 DIAGNOSIS — Z992 Dependence on renal dialysis: Secondary | ICD-10-CM | POA: Diagnosis not present

## 2023-12-09 DIAGNOSIS — Z992 Dependence on renal dialysis: Secondary | ICD-10-CM | POA: Diagnosis not present

## 2023-12-09 DIAGNOSIS — N2581 Secondary hyperparathyroidism of renal origin: Secondary | ICD-10-CM | POA: Diagnosis not present

## 2023-12-09 DIAGNOSIS — N186 End stage renal disease: Secondary | ICD-10-CM | POA: Diagnosis not present

## 2023-12-12 DIAGNOSIS — N186 End stage renal disease: Secondary | ICD-10-CM | POA: Diagnosis not present

## 2023-12-12 DIAGNOSIS — Z992 Dependence on renal dialysis: Secondary | ICD-10-CM | POA: Diagnosis not present

## 2023-12-12 DIAGNOSIS — N2581 Secondary hyperparathyroidism of renal origin: Secondary | ICD-10-CM | POA: Diagnosis not present

## 2023-12-14 DIAGNOSIS — N2581 Secondary hyperparathyroidism of renal origin: Secondary | ICD-10-CM | POA: Diagnosis not present

## 2023-12-14 DIAGNOSIS — Z992 Dependence on renal dialysis: Secondary | ICD-10-CM | POA: Diagnosis not present

## 2023-12-14 DIAGNOSIS — N186 End stage renal disease: Secondary | ICD-10-CM | POA: Diagnosis not present

## 2023-12-19 DIAGNOSIS — N2581 Secondary hyperparathyroidism of renal origin: Secondary | ICD-10-CM | POA: Diagnosis not present

## 2023-12-19 DIAGNOSIS — Z992 Dependence on renal dialysis: Secondary | ICD-10-CM | POA: Diagnosis not present

## 2023-12-19 DIAGNOSIS — N186 End stage renal disease: Secondary | ICD-10-CM | POA: Diagnosis not present

## 2023-12-21 DIAGNOSIS — E1122 Type 2 diabetes mellitus with diabetic chronic kidney disease: Secondary | ICD-10-CM | POA: Diagnosis not present

## 2023-12-21 DIAGNOSIS — N186 End stage renal disease: Secondary | ICD-10-CM | POA: Diagnosis not present

## 2023-12-21 DIAGNOSIS — Z992 Dependence on renal dialysis: Secondary | ICD-10-CM | POA: Diagnosis not present

## 2023-12-21 DIAGNOSIS — N2581 Secondary hyperparathyroidism of renal origin: Secondary | ICD-10-CM | POA: Diagnosis not present

## 2023-12-23 DIAGNOSIS — N2581 Secondary hyperparathyroidism of renal origin: Secondary | ICD-10-CM | POA: Diagnosis not present

## 2023-12-23 DIAGNOSIS — Z992 Dependence on renal dialysis: Secondary | ICD-10-CM | POA: Diagnosis not present

## 2023-12-23 DIAGNOSIS — N186 End stage renal disease: Secondary | ICD-10-CM | POA: Diagnosis not present

## 2023-12-26 DIAGNOSIS — N2581 Secondary hyperparathyroidism of renal origin: Secondary | ICD-10-CM | POA: Diagnosis not present

## 2023-12-26 DIAGNOSIS — N186 End stage renal disease: Secondary | ICD-10-CM | POA: Diagnosis not present

## 2023-12-26 DIAGNOSIS — Z992 Dependence on renal dialysis: Secondary | ICD-10-CM | POA: Diagnosis not present

## 2023-12-28 ENCOUNTER — Other Ambulatory Visit: Payer: Self-pay | Admitting: Family Medicine

## 2023-12-28 DIAGNOSIS — N186 End stage renal disease: Secondary | ICD-10-CM | POA: Diagnosis not present

## 2023-12-28 DIAGNOSIS — N2581 Secondary hyperparathyroidism of renal origin: Secondary | ICD-10-CM | POA: Diagnosis not present

## 2023-12-28 DIAGNOSIS — Z992 Dependence on renal dialysis: Secondary | ICD-10-CM | POA: Diagnosis not present

## 2023-12-28 DIAGNOSIS — F339 Major depressive disorder, recurrent, unspecified: Secondary | ICD-10-CM

## 2024-01-03 DIAGNOSIS — N2581 Secondary hyperparathyroidism of renal origin: Secondary | ICD-10-CM | POA: Diagnosis not present

## 2024-01-03 DIAGNOSIS — Z992 Dependence on renal dialysis: Secondary | ICD-10-CM | POA: Diagnosis not present

## 2024-01-03 DIAGNOSIS — N186 End stage renal disease: Secondary | ICD-10-CM | POA: Diagnosis not present

## 2024-01-05 DIAGNOSIS — N186 End stage renal disease: Secondary | ICD-10-CM | POA: Diagnosis not present

## 2024-01-05 DIAGNOSIS — Z992 Dependence on renal dialysis: Secondary | ICD-10-CM | POA: Diagnosis not present

## 2024-01-05 DIAGNOSIS — N2581 Secondary hyperparathyroidism of renal origin: Secondary | ICD-10-CM | POA: Diagnosis not present

## 2024-01-06 DIAGNOSIS — I129 Hypertensive chronic kidney disease with stage 1 through stage 4 chronic kidney disease, or unspecified chronic kidney disease: Secondary | ICD-10-CM | POA: Diagnosis not present

## 2024-01-06 DIAGNOSIS — Z992 Dependence on renal dialysis: Secondary | ICD-10-CM | POA: Diagnosis not present

## 2024-01-06 DIAGNOSIS — N186 End stage renal disease: Secondary | ICD-10-CM | POA: Diagnosis not present

## 2024-01-08 DIAGNOSIS — D631 Anemia in chronic kidney disease: Secondary | ICD-10-CM | POA: Diagnosis not present

## 2024-01-08 DIAGNOSIS — N186 End stage renal disease: Secondary | ICD-10-CM | POA: Diagnosis not present

## 2024-01-08 DIAGNOSIS — Z992 Dependence on renal dialysis: Secondary | ICD-10-CM | POA: Diagnosis not present

## 2024-01-08 DIAGNOSIS — N2581 Secondary hyperparathyroidism of renal origin: Secondary | ICD-10-CM | POA: Diagnosis not present

## 2024-01-10 DIAGNOSIS — Z992 Dependence on renal dialysis: Secondary | ICD-10-CM | POA: Diagnosis not present

## 2024-01-10 DIAGNOSIS — N2581 Secondary hyperparathyroidism of renal origin: Secondary | ICD-10-CM | POA: Diagnosis not present

## 2024-01-10 DIAGNOSIS — D631 Anemia in chronic kidney disease: Secondary | ICD-10-CM | POA: Diagnosis not present

## 2024-01-10 DIAGNOSIS — N186 End stage renal disease: Secondary | ICD-10-CM | POA: Diagnosis not present

## 2024-01-12 DIAGNOSIS — Z992 Dependence on renal dialysis: Secondary | ICD-10-CM | POA: Diagnosis not present

## 2024-01-12 DIAGNOSIS — N186 End stage renal disease: Secondary | ICD-10-CM | POA: Diagnosis not present

## 2024-01-12 DIAGNOSIS — N2581 Secondary hyperparathyroidism of renal origin: Secondary | ICD-10-CM | POA: Diagnosis not present

## 2024-01-12 DIAGNOSIS — D631 Anemia in chronic kidney disease: Secondary | ICD-10-CM | POA: Diagnosis not present

## 2024-01-15 DIAGNOSIS — Z992 Dependence on renal dialysis: Secondary | ICD-10-CM | POA: Diagnosis not present

## 2024-01-15 DIAGNOSIS — D631 Anemia in chronic kidney disease: Secondary | ICD-10-CM | POA: Diagnosis not present

## 2024-01-15 DIAGNOSIS — N2581 Secondary hyperparathyroidism of renal origin: Secondary | ICD-10-CM | POA: Diagnosis not present

## 2024-01-15 DIAGNOSIS — N186 End stage renal disease: Secondary | ICD-10-CM | POA: Diagnosis not present

## 2024-01-17 DIAGNOSIS — N186 End stage renal disease: Secondary | ICD-10-CM | POA: Diagnosis not present

## 2024-01-17 DIAGNOSIS — D631 Anemia in chronic kidney disease: Secondary | ICD-10-CM | POA: Diagnosis not present

## 2024-01-17 DIAGNOSIS — Z992 Dependence on renal dialysis: Secondary | ICD-10-CM | POA: Diagnosis not present

## 2024-01-17 DIAGNOSIS — N2581 Secondary hyperparathyroidism of renal origin: Secondary | ICD-10-CM | POA: Diagnosis not present

## 2024-01-22 DIAGNOSIS — N186 End stage renal disease: Secondary | ICD-10-CM | POA: Diagnosis not present

## 2024-01-22 DIAGNOSIS — D631 Anemia in chronic kidney disease: Secondary | ICD-10-CM | POA: Diagnosis not present

## 2024-01-22 DIAGNOSIS — Z992 Dependence on renal dialysis: Secondary | ICD-10-CM | POA: Diagnosis not present

## 2024-01-22 DIAGNOSIS — N2581 Secondary hyperparathyroidism of renal origin: Secondary | ICD-10-CM | POA: Diagnosis not present

## 2024-01-26 DIAGNOSIS — D631 Anemia in chronic kidney disease: Secondary | ICD-10-CM | POA: Diagnosis not present

## 2024-01-26 DIAGNOSIS — N186 End stage renal disease: Secondary | ICD-10-CM | POA: Diagnosis not present

## 2024-01-26 DIAGNOSIS — N2581 Secondary hyperparathyroidism of renal origin: Secondary | ICD-10-CM | POA: Diagnosis not present

## 2024-01-26 DIAGNOSIS — Z992 Dependence on renal dialysis: Secondary | ICD-10-CM | POA: Diagnosis not present

## 2024-01-29 DIAGNOSIS — D631 Anemia in chronic kidney disease: Secondary | ICD-10-CM | POA: Diagnosis not present

## 2024-01-29 DIAGNOSIS — N2581 Secondary hyperparathyroidism of renal origin: Secondary | ICD-10-CM | POA: Diagnosis not present

## 2024-01-29 DIAGNOSIS — Z992 Dependence on renal dialysis: Secondary | ICD-10-CM | POA: Diagnosis not present

## 2024-01-29 DIAGNOSIS — N186 End stage renal disease: Secondary | ICD-10-CM | POA: Diagnosis not present

## 2024-01-31 DIAGNOSIS — N186 End stage renal disease: Secondary | ICD-10-CM | POA: Diagnosis not present

## 2024-01-31 DIAGNOSIS — Z992 Dependence on renal dialysis: Secondary | ICD-10-CM | POA: Diagnosis not present

## 2024-01-31 DIAGNOSIS — D631 Anemia in chronic kidney disease: Secondary | ICD-10-CM | POA: Diagnosis not present

## 2024-01-31 DIAGNOSIS — N2581 Secondary hyperparathyroidism of renal origin: Secondary | ICD-10-CM | POA: Diagnosis not present

## 2024-02-02 DIAGNOSIS — D631 Anemia in chronic kidney disease: Secondary | ICD-10-CM | POA: Diagnosis not present

## 2024-02-02 DIAGNOSIS — N186 End stage renal disease: Secondary | ICD-10-CM | POA: Diagnosis not present

## 2024-02-02 DIAGNOSIS — N2581 Secondary hyperparathyroidism of renal origin: Secondary | ICD-10-CM | POA: Diagnosis not present

## 2024-02-02 DIAGNOSIS — Z992 Dependence on renal dialysis: Secondary | ICD-10-CM | POA: Diagnosis not present

## 2024-02-03 DIAGNOSIS — I129 Hypertensive chronic kidney disease with stage 1 through stage 4 chronic kidney disease, or unspecified chronic kidney disease: Secondary | ICD-10-CM | POA: Diagnosis not present

## 2024-02-03 DIAGNOSIS — Z992 Dependence on renal dialysis: Secondary | ICD-10-CM | POA: Diagnosis not present

## 2024-02-03 DIAGNOSIS — N186 End stage renal disease: Secondary | ICD-10-CM | POA: Diagnosis not present

## 2024-02-05 DIAGNOSIS — Z992 Dependence on renal dialysis: Secondary | ICD-10-CM | POA: Diagnosis not present

## 2024-02-05 DIAGNOSIS — N2581 Secondary hyperparathyroidism of renal origin: Secondary | ICD-10-CM | POA: Diagnosis not present

## 2024-02-05 DIAGNOSIS — N186 End stage renal disease: Secondary | ICD-10-CM | POA: Diagnosis not present

## 2024-02-07 DIAGNOSIS — Z992 Dependence on renal dialysis: Secondary | ICD-10-CM | POA: Diagnosis not present

## 2024-02-07 DIAGNOSIS — N2581 Secondary hyperparathyroidism of renal origin: Secondary | ICD-10-CM | POA: Diagnosis not present

## 2024-02-07 DIAGNOSIS — N186 End stage renal disease: Secondary | ICD-10-CM | POA: Diagnosis not present

## 2024-02-12 DIAGNOSIS — N186 End stage renal disease: Secondary | ICD-10-CM | POA: Diagnosis not present

## 2024-02-12 DIAGNOSIS — Z992 Dependence on renal dialysis: Secondary | ICD-10-CM | POA: Diagnosis not present

## 2024-02-12 DIAGNOSIS — N2581 Secondary hyperparathyroidism of renal origin: Secondary | ICD-10-CM | POA: Diagnosis not present

## 2024-02-14 DIAGNOSIS — N186 End stage renal disease: Secondary | ICD-10-CM | POA: Diagnosis not present

## 2024-02-14 DIAGNOSIS — N2581 Secondary hyperparathyroidism of renal origin: Secondary | ICD-10-CM | POA: Diagnosis not present

## 2024-02-14 DIAGNOSIS — Z992 Dependence on renal dialysis: Secondary | ICD-10-CM | POA: Diagnosis not present

## 2024-02-16 ENCOUNTER — Encounter (HOSPITAL_COMMUNITY): Payer: Self-pay

## 2024-02-19 ENCOUNTER — Encounter (HOSPITAL_COMMUNITY): Payer: Self-pay

## 2024-02-19 ENCOUNTER — Encounter (HOSPITAL_COMMUNITY): Payer: Self-pay | Admitting: Emergency Medicine

## 2024-02-19 ENCOUNTER — Emergency Department (HOSPITAL_COMMUNITY)
Admission: EM | Admit: 2024-02-19 | Discharge: 2024-02-20 | Disposition: A | Discharge status: Home / Self Care | Attending: Emergency Medicine | Admitting: Emergency Medicine

## 2024-02-19 ENCOUNTER — Other Ambulatory Visit: Payer: Self-pay

## 2024-02-19 ENCOUNTER — Emergency Department (HOSPITAL_COMMUNITY)

## 2024-02-19 DIAGNOSIS — K449 Diaphragmatic hernia without obstruction or gangrene: Secondary | ICD-10-CM | POA: Diagnosis not present

## 2024-02-19 DIAGNOSIS — K4020 Bilateral inguinal hernia, without obstruction or gangrene, not specified as recurrent: Secondary | ICD-10-CM | POA: Diagnosis not present

## 2024-02-19 DIAGNOSIS — K5730 Diverticulosis of large intestine without perforation or abscess without bleeding: Secondary | ICD-10-CM | POA: Diagnosis not present

## 2024-02-19 DIAGNOSIS — Z8673 Personal history of transient ischemic attack (TIA), and cerebral infarction without residual deficits: Secondary | ICD-10-CM | POA: Insufficient documentation

## 2024-02-19 DIAGNOSIS — N2889 Other specified disorders of kidney and ureter: Secondary | ICD-10-CM | POA: Diagnosis not present

## 2024-02-19 DIAGNOSIS — D631 Anemia in chronic kidney disease: Secondary | ICD-10-CM | POA: Diagnosis not present

## 2024-02-19 DIAGNOSIS — E875 Hyperkalemia: Secondary | ICD-10-CM | POA: Diagnosis not present

## 2024-02-19 DIAGNOSIS — E1122 Type 2 diabetes mellitus with diabetic chronic kidney disease: Secondary | ICD-10-CM | POA: Insufficient documentation

## 2024-02-19 DIAGNOSIS — Z85528 Personal history of other malignant neoplasm of kidney: Secondary | ICD-10-CM | POA: Insufficient documentation

## 2024-02-19 DIAGNOSIS — Z992 Dependence on renal dialysis: Secondary | ICD-10-CM | POA: Diagnosis not present

## 2024-02-19 DIAGNOSIS — I120 Hypertensive chronic kidney disease with stage 5 chronic kidney disease or end stage renal disease: Secondary | ICD-10-CM | POA: Insufficient documentation

## 2024-02-19 DIAGNOSIS — N186 End stage renal disease: Secondary | ICD-10-CM | POA: Insufficient documentation

## 2024-02-19 DIAGNOSIS — Z79899 Other long term (current) drug therapy: Secondary | ICD-10-CM | POA: Insufficient documentation

## 2024-02-19 DIAGNOSIS — R0602 Shortness of breath: Secondary | ICD-10-CM | POA: Diagnosis not present

## 2024-02-19 DIAGNOSIS — D649 Anemia, unspecified: Secondary | ICD-10-CM | POA: Diagnosis not present

## 2024-02-19 LAB — BASIC METABOLIC PANEL
Anion gap: 20 — ABNORMAL HIGH (ref 5–15)
BUN: 64 mg/dL — ABNORMAL HIGH (ref 6–20)
CO2: 27 mmol/L (ref 22–32)
Calcium: 8.3 mg/dL — ABNORMAL LOW (ref 8.9–10.3)
Chloride: 94 mmol/L — ABNORMAL LOW (ref 98–111)
Creatinine, Ser: 15.39 mg/dL — ABNORMAL HIGH (ref 0.61–1.24)
GFR, Estimated: 3 mL/min — ABNORMAL LOW (ref >60)
Glucose, Bld: 141 mg/dL — ABNORMAL HIGH (ref 70–99)
Potassium: 6.9 mmol/L (ref 3.5–5.1)
Sodium: 141 mmol/L (ref 135–145)

## 2024-02-19 LAB — CBC
HCT: 39.1 % (ref 39.0–52.0)
Hemoglobin: 12.5 g/dL — ABNORMAL LOW (ref 13.0–17.0)
MCH: 31.5 pg (ref 26.0–34.0)
MCHC: 32 g/dL (ref 30.0–36.0)
MCV: 98.5 fL (ref 80.0–100.0)
Platelets: 241 10*3/uL (ref 150–400)
RBC: 3.97 MIL/uL — ABNORMAL LOW (ref 4.22–5.81)
RDW: 12.1 % (ref 11.5–15.5)
WBC: 5.3 10*3/uL (ref 4.0–10.5)
nRBC: 0 % (ref 0.0–0.2)

## 2024-02-19 LAB — BRAIN NATRIURETIC PEPTIDE: B Natriuretic Peptide: 252.2 pg/mL — ABNORMAL HIGH (ref 0.0–100.0)

## 2024-02-19 MED ORDER — CALCIUM GLUCONATE-NACL 1-0.675 GM/50ML-% IV SOLN
1.0000 g | Freq: Once | INTRAVENOUS | Status: AC
  Administered 2024-02-19: 1000 mg via INTRAVENOUS
  Filled 2024-02-19: qty 50

## 2024-02-19 MED ORDER — SODIUM POLYSTYRENE SULFONATE 15 GM/60ML CO SUSP
30.0000 g | Freq: Once | Status: DC
  Filled 2024-02-19: qty 120

## 2024-02-19 MED ORDER — SODIUM BICARBONATE 8.4 % IV SOLN
50.0000 meq | Freq: Once | INTRAVENOUS | Status: AC
  Administered 2024-02-19: 50 meq via INTRAVENOUS
  Filled 2024-02-19: qty 50

## 2024-02-19 MED ORDER — DEXTROSE 50 % IV SOLN
1.0000 | Freq: Once | INTRAVENOUS | Status: AC
  Administered 2024-02-19: 50 mL via INTRAVENOUS
  Filled 2024-02-19: qty 50

## 2024-02-19 MED ORDER — INSULIN ASPART 100 UNIT/ML IV SOLN
5.0000 [IU] | Freq: Once | INTRAVENOUS | Status: AC
  Administered 2024-02-20: 5 [IU] via INTRAVENOUS

## 2024-02-19 MED ORDER — SODIUM ZIRCONIUM CYCLOSILICATE 10 G PO PACK
10.0000 g | PACK | Freq: Once | ORAL | Status: AC
  Administered 2024-02-19: 10 g via ORAL
  Filled 2024-02-19: qty 1

## 2024-02-19 MED ORDER — CHLORHEXIDINE GLUCONATE CLOTH 2 % EX PADS: 6.0000 | MEDICATED_PAD | Freq: Every day | CUTANEOUS | Status: DC

## 2024-02-19 MED ORDER — ALBUTEROL SULFATE (2.5 MG/3ML) 0.083% IN NEBU
10.0000 mg | INHALATION_SOLUTION | Freq: Once | RESPIRATORY_TRACT | Status: AC
  Administered 2024-02-20: 10 mg via RESPIRATORY_TRACT
  Filled 2024-02-19: qty 12

## 2024-02-19 NOTE — Progress Notes (Signed)
 Brief Nephrology Progress Note  Pt presented to ED after missing HD x2. K 6.9.  BP ok on RA.  [facility] MWF using AVF.  EKG w/o any major findings.  Plan for HD tonight and back to ED For re-eval and likely DC home.  D/w EDP and KDU RN.

## 2024-02-19 NOTE — ED Triage Notes (Addendum)
 Patient reports missing 2 dialysis sessions and feeling "full." MWF but can't be seen until Wednesday. Denies chest pain. Reports shortness of breath.
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
## 2024-02-19 NOTE — ED Notes (Signed)
 Patient transported to CT

## 2024-02-19 NOTE — ED Notes (Signed)
 Patient transported to CT. Ct made aware of pt being roomed and stated they will take the pt afterwards.

## 2024-02-20 ENCOUNTER — Telehealth: Payer: Self-pay | Admitting: *Deleted

## 2024-02-20 DIAGNOSIS — I120 Hypertensive chronic kidney disease with stage 5 chronic kidney disease or end stage renal disease: Secondary | ICD-10-CM | POA: Diagnosis not present

## 2024-02-20 LAB — HEPATITIS B SURFACE ANTIGEN: Hepatitis B Surface Ag: NONREACTIVE

## 2024-02-20 LAB — CBG MONITORING, ED: Glucose-Capillary: 291 mg/dL — ABNORMAL HIGH (ref 70–99)

## 2024-02-20 NOTE — Telephone Encounter (Addendum)
 Patient was identified as falling into the [place] Measure - Diabetes.   Patient was: Left voicemail to schedule with primary care provider.

## 2024-02-20 NOTE — Discharge Instructions (Signed)
 You were seen in the emergency department after missing your dialysis.  Your potassium level was high and you did appear to have some extra fluid on you.  You were able to receive dialysis while you are in the emergency department and your symptoms improved.  You should continue your outpatient dialysis schedule as planned and can follow-up with your primary doctor as needed.  You should return to the emergency department if you are having any new or worsening symptoms.

## 2024-02-20 NOTE — ED Provider Notes (Signed)
 Patient was reassessed by myself this morning after his dialysis session.  In short this is a 63 year old male with past medical history of renal cell carcinoma status post bilateral nephrectomy on HD that presented to the emergency department for weakness and shortness of breath and having missed dialysis.  His found to be hyperkalemic to 6.9 and mildly volume overloaded.  Patient also reported that he was having some worsening of his chronic nerve pain.  The patient reports that since returning from dialysis his pain has significantly improved and he is no longer feeling short of breath.  Patient's heart is regular rate and rhythm and lungs are clear to auscultation bilaterally, bandages in place over his fistula site on his left arm.  The patient is stable for discharge home with continued outpatient dialysis as scheduled and recommended primary care follow-up.  He was given strict return precautions.   Elayne Snare K, DO 02/20/24 [phone]

## 2024-02-20 NOTE — Progress Notes (Signed)
   02/20/24 0745  Vitals  Temp 97.8 F (36.6 C)  Pulse Rate 81  Resp (!) 23  BP (!) 126/95  SpO2 100 %  O2 Device Room Air  Weight  (pt on stretcher)  Post Treatment  Dialyzer Clearance Lightly streaked  Liters Processed 69.4  Fluid Removed (mL) 3000 mL  Tolerated HD Treatment Yes  AVG/AVF Arterial Site Held (minutes) 5 minutes  AVG/AVF Venous Site Held (minutes) 5 minutes   Received patient in bed to unit.  Alert and oriented.  Informed consent signed and in chart.   TX duration: Three hours  Patient tolerated well.  Transported back to the room  Alert, without acute distress.  Hand-off given to patient's nurse.   Access used: Left upper arm graft Access issues: none

## 2024-02-21 ENCOUNTER — Other Ambulatory Visit: Payer: Self-pay | Admitting: *Deleted

## 2024-02-21 DIAGNOSIS — N186 End stage renal disease: Secondary | ICD-10-CM

## 2024-02-21 LAB — HEPATITIS B SURFACE ANTIBODY, QUANTITATIVE: Hep B S AB Quant (Post): 64.5 m[IU]/mL

## 2024-02-28 DIAGNOSIS — N2581 Secondary hyperparathyroidism of renal origin: Secondary | ICD-10-CM | POA: Insufficient documentation

## 2024-02-28 NOTE — Progress Notes (Deleted)
 HISTORY AND PHYSICAL     CC:  dialysis access Requesting Provider:  Sandre Kitty, MD  HPI: This is a 63 y.o. male here for evaluation of his hemodialysis access.    Pt has hx of left BC AVF on 10/14/2022 by Dr. Chestine Spore.     The pt is right hand dominant.    Pt is on dialysis.   Days of dialysis if applicable:  ***    HD center:  ***  location.   The pt is on a statin for cholesterol management.  The pt is not on a daily aspirin.  Other AC:  none The pt is on CCB for hypertension.  The pt is  on medication for diabetes.   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 ischemic stroke   GERD (gastroesophageal reflux disease)    Hiatal hernia    Hyperlipidemia    Hypertension    Seizures (HCC)    none since 2016, taking Keppra - maw   Sleep apnea    Thyroid disease    "weak thyroid" per MD   Wears glasses     Past Surgical History:  Procedure Laterality Date   AV FISTULA PLACEMENT Left 10/14/2022   Procedure: LEFT BRACHIOCAPHALIC FISTULA CREATION;  Surgeon: Cephus Shelling, MD;  Location: [facility] OR;  Service: Vascular;  Laterality: Left;   BREAST BIOPSY Left 03/24/2023   [place] LT BREAST BX W LOC DEV 1ST LESION IMG BX SPEC US GUIDE 03/24/2023 [facility]MO[facility]   COLONOSCOPY     IR FLUORO GUIDE CV LINE RIGHT  10/06/2022   IR RADIOLOGIST EVAL & MGMT  04/04/2017   IR US GUIDE VASC ACCESS RIGHT  10/06/2022   KIDNEY SURGERY     ablation of renal cell CA - 12/28, prior one was October  2012-Dr. Yamagata   TEE WITHOUT CARDIOVERSION N/A 11/19/2013   Procedure: TRANSESOPHAGEAL ECHOCARDIOGRAM (TEE);  Surgeon: Lewayne Bunting, MD;  Location[facility] ENDOSCOPY;  Service: Cardiovascular;  Laterality: N/A;    Allergies  Allergen Reactions   Hydralazine Other (See Comments)    Chest pain if by mouth- can tolerate it via IV   Imdur [Isosorbide Nitrate] Other (See Comments)    headaches   Morphine Itching    Current Outpatient Medications  Medication Sig Dispense Refill   amLODipine (NORVASC) 10 MG tablet TAKE 2 TABLETS BY MOUTH EVERY DAY (Patient not taking: Reported on 02/20/2024) 180 tablet 1   atorvastatin (LIPITOR) 40 MG tablet Take 1 tablet (40 mg total) by mouth daily. (Patient taking differently: Take 40 mg by mouth See admin instructions. Take 1 tablet by mouth once or twice weekly) 90 tablet 1   blood glucose meter kit and supplies Dispense based on patient and insurance preference. Use up to four times daily as directed. (FOR ICD-9 250.00, 250.01). (Patient taking differently: 1 each by Other route See admin instructions. Dispense based on patient  and insurance preference. Use up to four times daily as directed. (FOR ICD-9 250.00, 250.01).) 1 each 0   Continuous Glucose Sensor (DEXCOM G7 SENSOR) MISC Apply every 15 days 2 each 3   glucose blood (ONETOUCH ULTRA) test strip Blood sugar testing done TID for insulin dependant Type @ diabetes - E11.65 90 strip 5   insulin aspart (NOVOLOG) 100 UNIT/ML injection Inject 25-40 Units into the skin 2 (two) times a week.     Lancets (ACCU-CHEK SOFT TOUCH) lancets Use as instructed (Patient taking differently: 1 each by Other route as directed.) 100 each 12   multivitamin (RENA-VIT) TABS tablet Take 1 tablet by mouth at bedtime. 90 tablet 3   pantoprazole (PROTONIX) 40 MG tablet Take 1 tablet (40 mg total) by mouth daily. (Patient taking differently: Take 40 mg by mouth daily as needed (for acid reflux).) 30 tablet 0   sertraline (ZOLOFT)  100 MG tablet TAKE 1 TABLET BY MOUTH EVERY DAY 90 tablet 0   sevelamer carbonate (RENVELA) 800 MG tablet Take 1,600 mg by mouth 3 (three) times daily with meals.     No current facility-administered medications for this visit.    Family History  Problem Relation Age of Onset   Pneumonia Mother    Breast cancer Mother    Diabetes Father    Heart disease Father    Breast cancer Sister    Prostate cancer Other    Stomach cancer Other    Colon cancer Neg Hx    Esophageal cancer Neg Hx    Rectal cancer Neg Hx     Social History   Socioeconomic History   Marital status: Single    Spouse name: Not on file   Number of children: 2   Years of education: college   Highest education level: Not on file  Occupational History   Occupation: disabled  Tobacco Use   Smoking status: Never    Passive exposure: Never   Smokeless tobacco: Never  Vaping Use   Vaping status: Never Used  Substance and Sexual Activity   Alcohol use: Not Currently    Comment: rarely   Drug use: No   Sexual activity: Never  Other Topics Concern   Not on file  Social History Narrative   Patient lives alone.   Education. Two years of college.   Not working.   Right handed.   Caffeine one soda daily. Drinks coffee    Social Drivers of Corporate investment banker Strain: Low Risk  (08/15/2023)   Overall Financial Resource Strain (CARDIA)    Difficulty of Paying Living Expenses: Not hard at all  Food Insecurity: No Food Insecurity (08/15/2023)   Hunger Vital Sign    Worried About Running Out of Food in the Last Year: Never true    Ran Out of Food in the Last Year: Never true  Transportation Needs: No Transportation Needs (08/15/2023)   PRAPARE - Administrator, Civil Service (Medical): No    Lack of Transportation (Non-Medical): No  Physical Activity: Sufficiently Active (08/15/2023)   Exercise Vital Sign    Days of Exercise per Week: 3 days    Minutes of Exercise per Session: 60 min  Stress:  No Stress Concern Present (08/15/2023)   [employer] of Occupational Health - Occupational Stress Questionnaire    Feeling of Stress : Not at all  Social Connections: Moderately Integrated (08/15/2023)   Social Connection and Isolation Panel [NHANES]    Frequency of Communication with Friends  and Family: More than three times a week    Frequency of Social Gatherings with Friends and Family: More than three times a week    Attends Religious Services: More than 4 times per year    Active Member of [employer] or Organizations: Yes    Attends Banker Meetings: More than 4 times per year    Marital Status: Never married  Intimate Partner Violence: Not At Risk (08/15/2023)   Humiliation, Afraid, Rape, and Kick questionnaire    Fear of Current or Ex-Partner: No    Emotionally Abused: No    Physically Abused: No    Sexually Abused: No     ROS: [x]  Positive   [ ]  Negative   [ ]  All sytems reviewed and are negative *** Cardiac: []  chest pain/pressure []  SOB/DOE  Vascular: []  pain in legs while walking []  pain in feet when lying flat []  swelling in legs  Pulmonary: []  asthma []  wheezing  Neurologic: []  hx CVA/TIA  Hematologic: []  bleeding problems  GI []  GERD  GU: [x]  CKD/renal failure  []  HD---[]  M/W/F []  T/T/S  Psychiatric: []  hx of major depression  Integumentary: []  rashes []  ulcers  Constitutional: []  fever []  chills   PHYSICAL EXAMINATION:  ***   General:  WDWN male in NAD Gait: Not observed HENT: WNL Pulmonary: normal non-labored breathing  Cardiac: {Desc; regular/irreg:14544}, {With/Without:20273} carotid bruit*** Abdomen: soft, NT Skin: {With/Without:20273} rashes Vascular Exam/Pulses:   Right Left  Radial {Exam; arterial pulse strength 0-4:30167} {Exam; arterial pulse strength 0-4:30167}  Ulnar {Exam; arterial pulse strength 0-4:30167} {Exam; arterial pulse strength 0-4:30167}   Extremities:  *** Musculoskeletal: no muscle wasting  or atrophy  Neurologic: A&O X 3  Non-Invasive Vascular Imaging:   Dialysis duplex on 02/29/2024: ***   ASSESSMENT/PLAN: 63 y.o. male with ESRD here for evaluation of his hemodialysis access with hx of  left BC AVF on 10/14/2022 by Dr. Chestine Spore who presents with ***   -*** -pt *** on dialysis on *** -discussed with pt that access does not last forever and will need intervention or even new access at some point.  -pt is *** hand dominant - will plan for *** -pt *** on anticoagulation   Doreatha Massed, [facility] [phone]  Clinic MD:   Karin Lieu

## 2024-02-29 ENCOUNTER — Emergency Department (HOSPITAL_COMMUNITY)

## 2024-02-29 ENCOUNTER — Ambulatory Visit (HOSPITAL_COMMUNITY): Attending: Vascular Surgery

## 2024-02-29 ENCOUNTER — Other Ambulatory Visit: Payer: Self-pay

## 2024-02-29 ENCOUNTER — Ambulatory Visit

## 2024-02-29 ENCOUNTER — Emergency Department (HOSPITAL_COMMUNITY): Admission: EM | Admit: 2024-02-29 | Discharge: 2024-02-29 | Disposition: A | Discharge status: Home / Self Care

## 2024-02-29 DIAGNOSIS — R0602 Shortness of breath: Secondary | ICD-10-CM | POA: Insufficient documentation

## 2024-02-29 DIAGNOSIS — Z794 Long term (current) use of insulin: Secondary | ICD-10-CM | POA: Insufficient documentation

## 2024-02-29 DIAGNOSIS — I10 Essential (primary) hypertension: Secondary | ICD-10-CM | POA: Diagnosis not present

## 2024-02-29 DIAGNOSIS — R109 Unspecified abdominal pain: Secondary | ICD-10-CM | POA: Diagnosis not present

## 2024-02-29 DIAGNOSIS — N186 End stage renal disease: Secondary | ICD-10-CM | POA: Insufficient documentation

## 2024-02-29 DIAGNOSIS — Z79899 Other long term (current) drug therapy: Secondary | ICD-10-CM | POA: Diagnosis not present

## 2024-02-29 DIAGNOSIS — D689 Coagulation defect, unspecified: Secondary | ICD-10-CM | POA: Insufficient documentation

## 2024-02-29 DIAGNOSIS — J9811 Atelectasis: Secondary | ICD-10-CM | POA: Diagnosis not present

## 2024-02-29 DIAGNOSIS — Z992 Dependence on renal dialysis: Secondary | ICD-10-CM | POA: Insufficient documentation

## 2024-02-29 DIAGNOSIS — M25142 Fistula, left hand: Secondary | ICD-10-CM | POA: Insufficient documentation

## 2024-02-29 DIAGNOSIS — I120 Hypertensive chronic kidney disease with stage 5 chronic kidney disease or end stage renal disease: Secondary | ICD-10-CM | POA: Insufficient documentation

## 2024-02-29 DIAGNOSIS — R0989 Other specified symptoms and signs involving the circulatory and respiratory systems: Secondary | ICD-10-CM | POA: Diagnosis not present

## 2024-02-29 LAB — COMPREHENSIVE METABOLIC PANEL WITH GFR
ALT: 8 U/L (ref 0–44)
AST: <5 U/L — ABNORMAL LOW (ref 15–41)
Albumin: 3.5 g/dL (ref 3.5–5.0)
Alkaline Phosphatase: 58 U/L (ref 38–126)
Anion gap: 20 — ABNORMAL HIGH (ref 5–15)
BUN: 113 mg/dL — ABNORMAL HIGH (ref 6–20)
CO2: 19 mmol/L — ABNORMAL LOW (ref 22–32)
Calcium: 7.8 mg/dL — ABNORMAL LOW (ref 8.9–10.3)
Chloride: 100 mmol/L (ref 98–111)
Creatinine, Ser: 20.61 mg/dL — ABNORMAL HIGH (ref 0.61–1.24)
GFR, Estimated: 2 mL/min — ABNORMAL LOW (ref >60)
Glucose, Bld: 133 mg/dL — ABNORMAL HIGH (ref 70–99)
Potassium: 7 mmol/L (ref 3.5–5.1)
Sodium: 139 mmol/L (ref 135–145)
Total Bilirubin: 0.7 mg/dL (ref 0.0–1.2)
Total Protein: 7.1 g/dL (ref 6.5–8.1)

## 2024-02-29 LAB — CBC
HCT: 32.9 % — ABNORMAL LOW (ref 39.0–52.0)
Hemoglobin: 10.9 g/dL — ABNORMAL LOW (ref 13.0–17.0)
MCH: 31.8 pg (ref 26.0–34.0)
MCHC: 33.1 g/dL (ref 30.0–36.0)
MCV: 95.9 fL (ref 80.0–100.0)
Platelets: 198 10*3/uL (ref 150–400)
RBC: 3.43 MIL/uL — ABNORMAL LOW (ref 4.22–5.81)
RDW: 12.2 % (ref 11.5–15.5)
WBC: 5.1 10*3/uL (ref 4.0–10.5)
nRBC: 0 % (ref 0.0–0.2)

## 2024-02-29 LAB — LIPASE, BLOOD: Lipase: 30 U/L (ref 11–51)

## 2024-02-29 MED ORDER — INSULIN ASPART 100 UNIT/ML IV SOLN: 5.0000 [IU] | Freq: Once | INTRAVENOUS | Status: DC

## 2024-02-29 MED ORDER — LIDOCAINE-PRILOCAINE 2.5-2.5 % EX CREA: 1.0000 | TOPICAL_CREAM | CUTANEOUS | Status: DC | PRN

## 2024-02-29 MED ORDER — CHLORHEXIDINE GLUCONATE CLOTH 2 % EX PADS: 6.0000 | MEDICATED_PAD | Freq: Every day | CUTANEOUS | Status: DC

## 2024-02-29 MED ORDER — PENTAFLUOROPROP-TETRAFLUOROETH EX AERO: 1.0000 | INHALATION_SPRAY | CUTANEOUS | Status: DC | PRN

## 2024-02-29 MED ORDER — CALCIUM GLUCONATE-NACL 1-0.675 GM/50ML-% IV SOLN
1.0000 g | Freq: Once | INTRAVENOUS | Status: DC
Start: 2024-02-29 — End: 2024-03-01
  Filled 2024-02-29: qty 50

## 2024-02-29 MED ORDER — ACETAMINOPHEN 325 MG PO TABS
ORAL_TABLET | ORAL | Status: AC
  Filled 2024-02-29: qty 2

## 2024-02-29 MED ORDER — ALBUTEROL SULFATE (2.5 MG/3ML) 0.083% IN NEBU
10.0000 mg | INHALATION_SOLUTION | Freq: Once | RESPIRATORY_TRACT | Status: AC
  Administered 2024-02-29: 10 mg via RESPIRATORY_TRACT
  Filled 2024-02-29: qty 12

## 2024-02-29 MED ORDER — HEPARIN SODIUM (PORCINE) 1000 UNIT/ML DIALYSIS: 1000.0000 [IU] | INTRAMUSCULAR | Status: DC | PRN

## 2024-02-29 MED ORDER — DEXTROSE 50 % IV SOLN: 1.0000 | Freq: Once | INTRAVENOUS | Status: DC

## 2024-02-29 MED ORDER — LIDOCAINE HCL (PF) 1 % IJ SOLN: 5.0000 mL | INTRAMUSCULAR | Status: DC | PRN

## 2024-02-29 MED ORDER — ALTEPLASE 2 MG IJ SOLR: 2.0000 mg | Freq: Once | INTRAMUSCULAR | Status: DC | PRN

## 2024-02-29 NOTE — Discharge Instructions (Signed)
 Please resume your normal dialysis schedule.  If you develop any life-threatening symptoms return to the emergency department.

## 2024-02-29 NOTE — ED Notes (Signed)
 Peyton Najjar PA made aware that pt has arrived from dialysis at this time.

## 2024-02-29 NOTE — ED Triage Notes (Signed)
 Pt. Stated, I've not had dialysis in a week. I was kicked out and now trying to find a place to take me. Im hurting really bad around my stomach and back.

## 2024-02-29 NOTE — Progress Notes (Signed)
 Brief Nephrology Note  Patient ESRD. Currently enrolled for HD at [facility]. He has not been going there regularly. He has not been fired from the unit at this time but is essentially on probation for aggressive behavior.  We encourage the patient to go to dialysis and comply with basic protocols.  K 7 today. Hasn't had HD in a week. Will provide HD today given urgent needs. Return to ER after HD for assessment and if stable can be dc'd from HD.  Patient should be on depakote so ensure he has this medication available to him

## 2024-02-29 NOTE — ED Provider Notes (Signed)
  Physical Exam  BP (!) 164/88   Pulse 83   Temp 98 F (36.7 C) (Oral)   Resp (!) 22   SpO2 100%   Physical Exam  Procedures  Procedures  ED Course / MDM   Clinical Course as of 02/29/24 2328  Thu Feb 29, 2024  1526 Spoke with nephrology, plan for dialysis this afternoon.  Agrees with albuterol and calcium, would hold off on insulin unless significant EKG changes.  Stated likely can discharge after dialysis [TY]    Clinical Course User Index [TY] Coral Spikes, DO   Medical Decision Making Amount and/or Complexity of Data Reviewed Labs: ordered. Radiology: ordered. ECG/medicine tests: ordered.  Risk Prescription drug management.   Patient in the emergency department for reevaluation after emergent dialysis.  Patient states he feels much better at this time.  Dialysis session completed with no reported complications.  Patient is eager to discharge home.  Discharged home in stable condition.       Pamala Duffel 02/29/24 2328    Nira Conn, MD 03/01/24 [phone]

## 2024-02-29 NOTE — ED Provider Notes (Signed)
 [place] EMERGENCY DEPARTMENT AT [facility] Provider Note   CSN: [id number] Arrival date & time: 02/29/24  1200     History  Chief Complaint  Patient presents with   Abdominal Pain   Flank Pain    Brandon [name] is a 63 y.o. male.  This is a 63 year old male ESRD has not had dialysis last time Brandon [name] came to the emergency department.  Reportedly was supposed to have dialysis today, but did not have a chair or space.  Brandon [name] notes some mild dyspnea on exertion.  Not having chest pain.  No fevers no chills.   Abdominal Pain Flank Pain Associated symptoms include abdominal pain.       Home Medications Prior to Admission medications   Medication Sig Start Date End Date Taking? Authorizing Provider  amLODipine (NORVASC) 10 MG tablet TAKE 2 TABLETS BY MOUTH EVERY DAY Patient not taking: Reported on 02/20/2024 03/27/23   Carlean Jews, NP  atorvastatin (LIPITOR) 40 MG tablet Take 1 tablet (40 mg total) by mouth daily. Patient taking differently: Take 40 mg by mouth See admin instructions. Take 1 tablet by mouth once or twice weekly 08/01/22   Carlean Jews, NP  blood glucose meter kit and supplies Dispense based on patient and insurance preference. Use up to four times daily as directed. (FOR ICD-9 250.00, 250.01). Patient taking differently: 1 each by Other route See admin instructions. Dispense based on patient and insurance preference. Use up to four times daily as directed. (FOR ICD-9 250.00, 250.01). 04/24/16   Rodolph Bong, MD  Continuous Glucose Sensor (DEXCOM G7 SENSOR) MISC Apply every 15 days 10/05/23   Sandre Kitty, MD  glucose blood (ONETOUCH ULTRA) test strip Blood sugar testing done TID for insulin dependant Type @ diabetes - E11.65 04/12/22   Carlean Jews, NP  insulin aspart (NOVOLOG) 100 UNIT/ML injection Inject 25-40 Units into the skin 2 (two) times a week.    [provider]  Lancets (ACCU-CHEK SOFT TOUCH) lancets Use as  instructed Patient taking differently: 1 each by Other route as directed. 06/11/19   Tysinger, Kermit Balo, PA-C  multivitamin (RENA-VIT) TABS tablet Take 1 tablet by mouth at bedtime. 11/09/22   Carlean Jews, NP  pantoprazole (PROTONIX) 40 MG tablet Take 1 tablet (40 mg total) by mouth daily. Patient taking differently: Take 40 mg by mouth daily as needed (for acid reflux). 10/16/22   Leroy Sea, MD  sertraline (ZOLOFT) 100 MG tablet TAKE 1 TABLET BY MOUTH EVERY DAY 12/28/23   Sandre Kitty, MD  sevelamer carbonate (RENVELA) 800 MG tablet Take 1,600 mg by mouth 3 (three) times daily with meals. 12/12/23   [provider]      Allergies    Hydralazine, Imdur [isosorbide nitrate], and Morphine    Review of Systems   Review of Systems  Gastrointestinal:  Positive for abdominal pain.  Genitourinary:  Positive for flank pain.    Physical Exam Updated Vital Signs BP (!) 164/92   Pulse 76   Resp 16   SpO2 99%  Physical Exam Vitals and nursing note reviewed.  HENT:     Head: Normocephalic and atraumatic.     Nose: Nose normal.     Mouth/Throat:     Mouth: Mucous membranes are moist.  Eyes:     Conjunctiva/sclera: Conjunctivae normal.  Cardiovascular:     Rate and Rhythm: Normal rate and regular rhythm.  Pulmonary:     Effort: Pulmonary effort  is normal.     Breath sounds: Normal breath sounds.  Abdominal:     General: Abdomen is flat. There is no distension.     Tenderness: There is no abdominal tenderness. There is no guarding or rebound.  Musculoskeletal:        General: Normal range of motion.     Right lower leg: No edema.     Left lower leg: No edema.     Comments: Left upper extremity fistula.  Palpable thrill.  Skin:    General: Skin is warm.  Neurological:     General: No focal deficit present.     Mental Status: Brandon [name] is alert.  Psychiatric:        Mood and Affect: Mood normal.        Behavior: Behavior normal.     ED Results / Procedures /  Treatments   Labs (all labs ordered are listed, but only abnormal results are displayed) Labs Reviewed  COMPREHENSIVE METABOLIC PANEL WITH GFR - Abnormal; Notable for the following components:      Result Value   Potassium 7.0 (*)    CO2 19 (*)    Glucose, Bld 133 (*)    BUN 113 (*)    Creatinine, Ser 20.61 (*)    Calcium 7.8 (*)    AST <5 (*)    GFR, Estimated 2 (*)    Anion gap 20 (*)    All other components within normal limits  CBC - Abnormal; Notable for the following components:   RBC 3.43 (*)    Hemoglobin 10.9 (*)    HCT 32.9 (*)    All other components within normal limits  LIPASE, BLOOD  HEPATITIS B SURFACE ANTIGEN  HEPATITIS B SURFACE ANTIBODY, QUANTITATIVE    EKG EKG Interpretation Date/Time:  Thursday February 29 2024 15:41:01 EDT Ventricular Rate:  80 PR Interval:  277 QRS Duration:  104 QT Interval:  406 QTC Calculation: 469 R Axis:   37  Text Interpretation: Sinus rhythm Prolonged PR interval Anterior infarct, old Confirmed by Estanislado Pandy [phone]) on 02/29/2024 4:35:27 PM  Radiology DG Chest Portable 1 View Result Date: 02/29/2024 CLINICAL DATA:  History of ESRD.  Missed recent dialysis. EXAM: PORTABLE CHEST 1 VIEW COMPARISON:  Chest radiograph dated 02/19/2024. FINDINGS: The heart size is upper limits of normal. Mediastinal contours are within normal limits. Central pulmonary vascular congestion with mildly increased interstitial prominence. Mild left basilar atelectasis. No focal consolidation, sizeable pleural effusion, or pneumothorax. No acute osseous abnormality. IMPRESSION: Central pulmonary vascular congestion with possible mild interstitial edema, new since the prior exam. Electronically Signed   By: Hart Robinsons M.D.   On: 02/29/2024 16:43    Procedures .Critical Care  Performed by: Coral Spikes, DO Authorized by: Coral Spikes, DO   Critical care provider statement:    Critical care time (minutes):  30   Critical care was necessary to  treat or prevent imminent or life-threatening deterioration of the following conditions:  Metabolic crisis   Critical care was time spent personally by me on the following activities:  Development of treatment plan with patient or surrogate, discussions with consultants, evaluation of patient's response to treatment, examination of patient, ordering and review of laboratory studies, ordering and review of radiographic studies, ordering and performing treatments and interventions, pulse oximetry, re-evaluation of patient's condition and review of old charts     Medications Ordered in ED Medications  calcium gluconate 1 g/ 50 mL sodium chloride IVPB (has no administration  in time range)  Chlorhexidine Gluconate Cloth 2 % PADS 6 each (has no administration in time range)  pentafluoroprop-tetrafluoroeth (GEBAUERS) aerosol 1 Application (has no administration in time range)  lidocaine (PF) (XYLOCAINE) 1 % injection 5 mL (has no administration in time range)  lidocaine-prilocaine (EMLA) cream 1 Application (has no administration in time range)  heparin injection 1,000 Units (has no administration in time range)  alteplase (CATHFLO ACTIVASE) injection 2 mg (has no administration in time range)  albuterol (PROVENTIL) (2.5 MG/3ML) 0.083% nebulizer solution 10 mg (10 mg Nebulization Given 02/29/24 1557)    ED Course/ Medical Decision Making/ A&P Clinical Course as of 02/29/24 1656  Thu Feb 29, 2024  1526 Spoke with nephrology, plan for dialysis this afternoon.  Agrees with albuterol and calcium, would hold off on insulin unless significant EKG changes.  Stated likely can discharge after dialysis [TY]    Clinical Course User Index [TY] Coral Spikes, DO                                 Medical Decision Making This is a 63 year old male presenting emergency department for dialysis.  Hypertensive, clinically well-appearing.  Does not appear to be in respiratory distress, lungs largely clear.  Labs  however showed a potassium of 7.  Per chart review was last dialyzed close to 10 days ago.  His potassium is seemingly 6.9 at that time.  EKG today without significant hyperkalemic type changes.  Given calcium and albuterol.  Case discussed with nephrology; see ED course.  Also with widened gap, likely secondary to uremia.  Care signed out to afternoon team.  Patient to go to dialysis and return to ED for reevaluation.  Amount and/or Complexity of Data Reviewed External Data Reviewed:     Details: See above Labs: ordered. Decision-making details documented in ED Course. Radiology: ordered and independent interpretation performed.    Details: Patient with some vascular congestion, does not of have overt pulmonary edema. ECG/medicine tests: ordered and independent interpretation performed.  Risk Prescription drug management. Decision regarding hospitalization. Diagnosis or treatment significantly limited by social determinants of health.          Final Clinical Impression(s) / ED Diagnoses Final diagnoses:  None    Rx / DC Orders ED Discharge Orders     None         Coral Spikes, DO 02/29/24 1656

## 2024-02-29 NOTE — Procedures (Signed)
 I was present at this dialysis session. I have reviewed the session itself and made appropriate changes.   Tolerating well thus far. Was told by Dr Signe Colt he has an outpatient spot at [facility] for now while they look for another unit. Told him he should go to HD again tomorrow and follow his schedule.  Filed Weights    Recent Labs  Lab 02/29/24 1247  NA 139  K 7.0*  CL 100  CO2 19*  GLUCOSE 133*  BUN 113*  CREATININE 20.61*  CALCIUM 7.8*    Recent Labs  Lab 02/29/24 1247  WBC 5.1  HGB 10.9*  HCT 32.9*  MCV 95.9  PLT 198    Scheduled Meds:  [START ON 03/01/2024] Chlorhexidine Gluconate Cloth  6 each Topical Q0600   Continuous Infusions:  calcium gluconate     PRN Meds:.alteplase, heparin, lidocaine (PF), lidocaine-prilocaine, pentafluoroprop-tetrafluoroeth   Louie Bun,  MD 02/29/2024, 7:54 PM

## 2024-03-04 ENCOUNTER — Telehealth: Payer: Self-pay

## 2024-03-04 DIAGNOSIS — Z992 Dependence on renal dialysis: Secondary | ICD-10-CM | POA: Diagnosis not present

## 2024-03-04 DIAGNOSIS — N186 End stage renal disease: Secondary | ICD-10-CM | POA: Diagnosis not present

## 2024-03-04 DIAGNOSIS — N2581 Secondary hyperparathyroidism of renal origin: Secondary | ICD-10-CM | POA: Diagnosis not present

## 2024-03-04 NOTE — Transitions of Care (Post Inpatient/ED Visit) (Signed)
 03/04/2024  Name: Brandon [name] MRN: [id number] DOB: 1961/06/12  Today's TOC FU Call Status: Today's TOC FU Call Status:: Successful TOC FU Call Completed TOC FU Call Complete Date: 03/04/24 Patient's Name and Date of Birth confirmed.  Transition Care Management Follow-up Telephone Call Date of Discharge: 02/29/24 Discharge Facility: [facility]) Type of Discharge: Emergency Department Reason for ED Visit: Other: (Dialysis) How have you been since you were released from the hospital?: Better Any questions or concerns?: No  Items Reviewed: Did you receive and understand the discharge instructions provided?: Yes Medications obtained,verified, and reconciled?: Yes (Medications Reviewed) Any new allergies since your discharge?: No Dietary orders reviewed?: NA Do you have support at home?: Yes People in Home: significant other  Medications Reviewed Today: Medications Reviewed Today     Reviewed by Anthoney Harada, LPN (Licensed Practical Nurse) on 03/04/24 at 1448  Med List Status: <None>   Medication Order Taking? Sig Documenting Provider Last Dose Status Informant  amLODipine (NORVASC) 10 MG tablet [id number] No TAKE 2 TABLETS BY MOUTH EVERY DAY  Patient not taking: Reported on 03/04/2024   Carlean Jews, NP Not Taking Active Self  atorvastatin (LIPITOR) 40 MG tablet [id number] Yes Take 1 tablet (40 mg total) by mouth daily.  Patient taking differently: Take 40 mg by mouth See admin instructions. Take 1 tablet by mouth once or twice weekly   Carlean Jews, NP Taking Active Self  blood glucose meter kit and supplies [id number] Yes Dispense based on patient and insurance preference. Use up to four times daily as directed. (FOR ICD-9 250.00, 250.01).  Patient taking differently: 1 each by Other route See admin instructions. Dispense based on patient and insurance preference. Use up to four times daily as directed. (FOR ICD-9 250.00, 250.01).   Rodolph Bong, MD  Taking Active Self  Continuous Glucose Sensor (DEXCOM G7 [place]) [place] [id number] Yes Apply every 15 days Sandre Kitty, MD Taking Active Self  glucose blood [facility] ULTRA) test strip [id number] Yes Blood sugar testing done TID for insulin dependant Type @ diabetes - E11.65 Carlean Jews, NP Taking Active Self  insulin aspart (NOVOLOG) 100 UNIT/ML injection [id number] Yes Inject 25-40 Units into the skin 2 (two) times a week. [provider] Taking Active Self  Lancets (ACCU-CHEK SOFT TOUCH) lancets [id number] Yes Use as instructed  Patient taking differently: 1 each by Other route as directed.   Tysinger, Kermit Balo, PA-C Taking Active Self  multivitamin (RENA-VIT) TABS tablet [id number] Yes Take 1 tablet by mouth at bedtime. Carlean Jews, NP Taking Active Self  pantoprazole (PROTONIX) 40 MG tablet [id number] Yes Take 1 tablet (40 mg total) by mouth daily.  Patient taking differently: Take 40 mg by mouth daily as needed (for acid reflux).   Leroy Sea, MD Taking Active Self  sertraline (ZOLOFT) 100 MG tablet [id number] Yes TAKE 1 TABLET BY MOUTH EVERY DAY Sandre Kitty, MD Taking Active Self  sevelamer carbonate (RENVELA) 800 MG tablet [id number] Yes Take 1,600 mg by mouth 3 (three) times daily with meals. [provider] Taking Active Self  Med List Note Marsa Aris 04/20/22 1843): Novolog pen 07/10/21            Home Care and Equipment/Supplies: Were Home Health Services Ordered?: NA Any new equipment or medical supplies ordered?: NA  Functional Questionnaire: Do you need assistance with bathing/showering or dressing?: No Do you need assistance with meal preparation?: No Do you need assistance with  eating?: No Do you have difficulty maintaining continence: No Do you need assistance with getting out of bed/getting out of a chair/moving?: No Do you have difficulty managing or taking your medications?: No  Follow up appointments  reviewed: PCP Follow-up appointment confirmed?: NA Specialist Hospital Follow-up appointment confirmed?: NA Do you need transportation to your follow-up appointment?: No Do you understand care options if your condition(s) worsen?: Yes-patient verbalized understanding    SIGNATURE Kandis Fantasia, LPN [facility] Health Advisor [place] [facility] You Are. We Are. One [facility] Direct Dial [phone]

## 2024-03-05 DIAGNOSIS — I120 Hypertensive chronic kidney disease with stage 5 chronic kidney disease or end stage renal disease: Secondary | ICD-10-CM | POA: Diagnosis not present

## 2024-03-05 DIAGNOSIS — Z992 Dependence on renal dialysis: Secondary | ICD-10-CM | POA: Diagnosis not present

## 2024-03-05 DIAGNOSIS — N186 End stage renal disease: Secondary | ICD-10-CM | POA: Diagnosis not present

## 2024-03-06 ENCOUNTER — Telehealth: Payer: Self-pay | Admitting: *Deleted

## 2024-03-06 DIAGNOSIS — N186 End stage renal disease: Secondary | ICD-10-CM

## 2024-03-06 DIAGNOSIS — D631 Anemia in chronic kidney disease: Secondary | ICD-10-CM | POA: Diagnosis not present

## 2024-03-06 DIAGNOSIS — N2581 Secondary hyperparathyroidism of renal origin: Secondary | ICD-10-CM | POA: Diagnosis not present

## 2024-03-06 DIAGNOSIS — Z992 Dependence on renal dialysis: Secondary | ICD-10-CM | POA: Diagnosis not present

## 2024-03-06 NOTE — Progress Notes (Signed)
 Complex Care Management Note  Care Guide Note 03/06/2024 Name: VOLNEY REIERSON MRN: [id number] DOB: 11/12/1961  JOSYAH ACHOR is a 63 y.o. year old male who sees Sandre Kitty, MD for primary care. I reached out to Lollie Sails by phone today to offer complex care management services.  Mr. Mcfarren was given information about Complex Care Management services today including:   The Complex Care Management services include support from the care team which includes your Nurse Care Manager, Clinical Social Worker, or Pharmacist.  The Complex Care Management team is here to help remove barriers to the health concerns and goals most important to you. Complex Care Management services are voluntary, and the patient may decline or stop services at any time by request to their care team member.   Complex Care Management Consent Status: Patient did not agree to participate in complex care management services at this time.  Follow up plan:  None   Encounter Outcome:  Patient Refused  Gwenevere Ghazi  [facility]  [facility], [facility] Guide  Direct Dial: [phone]  Fax [phone]

## 2024-03-08 DIAGNOSIS — N186 End stage renal disease: Secondary | ICD-10-CM | POA: Diagnosis not present

## 2024-03-08 DIAGNOSIS — D631 Anemia in chronic kidney disease: Secondary | ICD-10-CM | POA: Diagnosis not present

## 2024-03-08 DIAGNOSIS — Z992 Dependence on renal dialysis: Secondary | ICD-10-CM | POA: Diagnosis not present

## 2024-03-08 DIAGNOSIS — N2581 Secondary hyperparathyroidism of renal origin: Secondary | ICD-10-CM | POA: Diagnosis not present

## 2024-03-11 DIAGNOSIS — N2581 Secondary hyperparathyroidism of renal origin: Secondary | ICD-10-CM | POA: Diagnosis not present

## 2024-03-11 DIAGNOSIS — Z992 Dependence on renal dialysis: Secondary | ICD-10-CM | POA: Diagnosis not present

## 2024-03-11 DIAGNOSIS — N186 End stage renal disease: Secondary | ICD-10-CM | POA: Diagnosis not present

## 2024-03-11 DIAGNOSIS — D631 Anemia in chronic kidney disease: Secondary | ICD-10-CM | POA: Diagnosis not present

## 2024-03-13 DIAGNOSIS — N186 End stage renal disease: Secondary | ICD-10-CM | POA: Diagnosis not present

## 2024-03-13 DIAGNOSIS — D631 Anemia in chronic kidney disease: Secondary | ICD-10-CM | POA: Diagnosis not present

## 2024-03-13 DIAGNOSIS — N2581 Secondary hyperparathyroidism of renal origin: Secondary | ICD-10-CM | POA: Diagnosis not present

## 2024-03-13 DIAGNOSIS — Z992 Dependence on renal dialysis: Secondary | ICD-10-CM | POA: Diagnosis not present

## 2024-03-15 DIAGNOSIS — N186 End stage renal disease: Secondary | ICD-10-CM | POA: Diagnosis not present

## 2024-03-15 DIAGNOSIS — N2581 Secondary hyperparathyroidism of renal origin: Secondary | ICD-10-CM | POA: Diagnosis not present

## 2024-03-15 DIAGNOSIS — D631 Anemia in chronic kidney disease: Secondary | ICD-10-CM | POA: Diagnosis not present

## 2024-03-15 DIAGNOSIS — Z992 Dependence on renal dialysis: Secondary | ICD-10-CM | POA: Diagnosis not present

## 2024-03-18 DIAGNOSIS — D631 Anemia in chronic kidney disease: Secondary | ICD-10-CM | POA: Diagnosis not present

## 2024-03-18 DIAGNOSIS — Z992 Dependence on renal dialysis: Secondary | ICD-10-CM | POA: Diagnosis not present

## 2024-03-18 DIAGNOSIS — N186 End stage renal disease: Secondary | ICD-10-CM | POA: Diagnosis not present

## 2024-03-18 DIAGNOSIS — N2581 Secondary hyperparathyroidism of renal origin: Secondary | ICD-10-CM | POA: Diagnosis not present

## 2024-03-18 DIAGNOSIS — E1122 Type 2 diabetes mellitus with diabetic chronic kidney disease: Secondary | ICD-10-CM | POA: Diagnosis not present

## 2024-03-21 DIAGNOSIS — Z992 Dependence on renal dialysis: Secondary | ICD-10-CM | POA: Diagnosis not present

## 2024-03-21 DIAGNOSIS — N186 End stage renal disease: Secondary | ICD-10-CM | POA: Diagnosis not present

## 2024-03-23 DIAGNOSIS — N186 End stage renal disease: Secondary | ICD-10-CM | POA: Diagnosis not present

## 2024-03-23 DIAGNOSIS — Z992 Dependence on renal dialysis: Secondary | ICD-10-CM | POA: Diagnosis not present

## 2024-03-27 ENCOUNTER — Other Ambulatory Visit: Payer: Self-pay

## 2024-03-27 ENCOUNTER — Inpatient Hospital Stay (HOSPITAL_COMMUNITY)
Admission: EM | Admit: 2024-03-27 | Discharge: 2024-03-30 | DRG: 314 | Disposition: A | Discharge status: Home / Self Care | Attending: Internal Medicine | Admitting: Internal Medicine

## 2024-03-27 ENCOUNTER — Encounter (HOSPITAL_COMMUNITY): Payer: Self-pay | Admitting: *Deleted

## 2024-03-27 DIAGNOSIS — Z8249 Family history of ischemic heart disease and other diseases of the circulatory system: Secondary | ICD-10-CM

## 2024-03-27 DIAGNOSIS — T82510A Breakdown (mechanical) of surgically created arteriovenous fistula, initial encounter: Principal | ICD-10-CM | POA: Diagnosis present

## 2024-03-27 DIAGNOSIS — Z8673 Personal history of transient ischemic attack (TIA), and cerebral infarction without residual deficits: Secondary | ICD-10-CM

## 2024-03-27 DIAGNOSIS — E1159 Type 2 diabetes mellitus with other circulatory complications: Secondary | ICD-10-CM | POA: Diagnosis present

## 2024-03-27 DIAGNOSIS — N185 Chronic kidney disease, stage 5: Secondary | ICD-10-CM | POA: Diagnosis not present

## 2024-03-27 DIAGNOSIS — Z79899 Other long term (current) drug therapy: Secondary | ICD-10-CM

## 2024-03-27 DIAGNOSIS — Z885 Allergy status to narcotic agent status: Secondary | ICD-10-CM | POA: Diagnosis not present

## 2024-03-27 DIAGNOSIS — Z85528 Personal history of other malignant neoplasm of kidney: Secondary | ICD-10-CM | POA: Diagnosis not present

## 2024-03-27 DIAGNOSIS — T82898A Other specified complication of vascular prosthetic devices, implants and grafts, initial encounter: Secondary | ICD-10-CM | POA: Diagnosis present

## 2024-03-27 DIAGNOSIS — Z833 Family history of diabetes mellitus: Secondary | ICD-10-CM | POA: Diagnosis not present

## 2024-03-27 DIAGNOSIS — I152 Hypertension secondary to endocrine disorders: Secondary | ICD-10-CM | POA: Diagnosis present

## 2024-03-27 DIAGNOSIS — K219 Gastro-esophageal reflux disease without esophagitis: Secondary | ICD-10-CM | POA: Diagnosis present

## 2024-03-27 DIAGNOSIS — G4730 Sleep apnea, unspecified: Secondary | ICD-10-CM | POA: Diagnosis not present

## 2024-03-27 DIAGNOSIS — Z888 Allergy status to other drugs, medicaments and biological substances status: Secondary | ICD-10-CM

## 2024-03-27 DIAGNOSIS — G40909 Epilepsy, unspecified, not intractable, without status epilepticus: Secondary | ICD-10-CM | POA: Diagnosis present

## 2024-03-27 DIAGNOSIS — F339 Major depressive disorder, recurrent, unspecified: Secondary | ICD-10-CM | POA: Diagnosis present

## 2024-03-27 DIAGNOSIS — M898X9 Other specified disorders of bone, unspecified site: Secondary | ICD-10-CM | POA: Diagnosis present

## 2024-03-27 DIAGNOSIS — E079 Disorder of thyroid, unspecified: Secondary | ICD-10-CM | POA: Diagnosis present

## 2024-03-27 DIAGNOSIS — D631 Anemia in chronic kidney disease: Secondary | ICD-10-CM | POA: Diagnosis present

## 2024-03-27 DIAGNOSIS — E1122 Type 2 diabetes mellitus with diabetic chronic kidney disease: Secondary | ICD-10-CM | POA: Diagnosis present

## 2024-03-27 DIAGNOSIS — T82818A Embolism of vascular prosthetic devices, implants and grafts, initial encounter: Secondary | ICD-10-CM | POA: Diagnosis not present

## 2024-03-27 DIAGNOSIS — T82398A Other mechanical complication of other vascular grafts, initial encounter: Secondary | ICD-10-CM | POA: Diagnosis not present

## 2024-03-27 DIAGNOSIS — I120 Hypertensive chronic kidney disease with stage 5 chronic kidney disease or end stage renal disease: Secondary | ICD-10-CM | POA: Diagnosis present

## 2024-03-27 DIAGNOSIS — E785 Hyperlipidemia, unspecified: Secondary | ICD-10-CM | POA: Diagnosis not present

## 2024-03-27 DIAGNOSIS — E875 Hyperkalemia: Secondary | ICD-10-CM | POA: Diagnosis not present

## 2024-03-27 DIAGNOSIS — Z992 Dependence on renal dialysis: Principal | ICD-10-CM

## 2024-03-27 DIAGNOSIS — Y832 Surgical operation with anastomosis, bypass or graft as the cause of abnormal reaction of the patient, or of later complication, without mention of misadventure at the time of the procedure: Secondary | ICD-10-CM | POA: Diagnosis present

## 2024-03-27 DIAGNOSIS — Y712 Prosthetic and other implants, materials and accessory cardiovascular devices associated with adverse incidents: Secondary | ICD-10-CM | POA: Diagnosis present

## 2024-03-27 DIAGNOSIS — Z794 Long term (current) use of insulin: Secondary | ICD-10-CM | POA: Diagnosis not present

## 2024-03-27 DIAGNOSIS — N250 Renal osteodystrophy: Secondary | ICD-10-CM | POA: Diagnosis not present

## 2024-03-27 DIAGNOSIS — N2581 Secondary hyperparathyroidism of renal origin: Secondary | ICD-10-CM | POA: Diagnosis not present

## 2024-03-27 DIAGNOSIS — N186 End stage renal disease: Principal | ICD-10-CM

## 2024-03-27 LAB — CBC
HCT: 35.2 % — ABNORMAL LOW (ref 39.0–52.0)
Hemoglobin: 11.6 g/dL — ABNORMAL LOW (ref 13.0–17.0)
MCH: 31.6 pg (ref 26.0–34.0)
MCHC: 33 g/dL (ref 30.0–36.0)
MCV: 95.9 fL (ref 80.0–100.0)
Platelets: 225 10*3/uL (ref 150–400)
RBC: 3.67 MIL/uL — ABNORMAL LOW (ref 4.22–5.81)
RDW: 12.6 % (ref 11.5–15.5)
WBC: 5.7 10*3/uL (ref 4.0–10.5)
nRBC: 0 % (ref 0.0–0.2)

## 2024-03-27 LAB — COMPREHENSIVE METABOLIC PANEL WITH GFR
ALT: 12 U/L (ref 0–44)
AST: 7 U/L — ABNORMAL LOW (ref 15–41)
Albumin: 4 g/dL (ref 3.5–5.0)
Alkaline Phosphatase: 88 U/L (ref 38–126)
Anion gap: 19 — ABNORMAL HIGH (ref 5–15)
BUN: 93 mg/dL — ABNORMAL HIGH (ref 6–20)
CO2: 24 mmol/L (ref 22–32)
Calcium: 7.7 mg/dL — ABNORMAL LOW (ref 8.9–10.3)
Chloride: 96 mmol/L — ABNORMAL LOW (ref 98–111)
Creatinine, Ser: 14.75 mg/dL — ABNORMAL HIGH (ref 0.61–1.24)
GFR, Estimated: 3 mL/min — ABNORMAL LOW (ref >60)
Glucose, Bld: 163 mg/dL — ABNORMAL HIGH (ref 70–99)
Potassium: 4.6 mmol/L (ref 3.5–5.1)
Sodium: 139 mmol/L (ref 135–145)
Total Bilirubin: 0.9 mg/dL (ref 0.0–1.2)
Total Protein: 8 g/dL (ref 6.5–8.1)

## 2024-03-27 NOTE — ED Triage Notes (Signed)
 The pts dialysis catheter is not working right  he was last dialyzed  saturday

## 2024-03-27 NOTE — ED Provider Triage Note (Signed)
 Emergency Medicine Provider Triage Evaluation Note  Brandon [name] , a 63 y.o. male  was evaluated in triage.  Pt complains of vascular access issue. Patient receives dialysis T/Th/S, has not had dialysis since Saturday due to issues with his access site (LUE), was instructed by his nephrologist to come to the ED for further evaluation. Patient reports some tenderness/swelling around the site. Denies fever, chest pain, shortness of breath.  Review of Systems  Positive: As above Negative: As above  Physical Exam  BP 137/80 (BP Location: Right Arm)   Pulse 73   Temp 98.8 F (37.1 C)   Resp 14   Ht 6' (1.829 m)   Wt 94.8 kg   SpO2 100%   BMI 28.35 kg/m  Gen:   Awake, no distress   Resp:  Normal effort  MSK:   Moves extremities without difficulty  Other:  Palpable thrill over AV fistula, LUE  Medical Decision Making  Medically screening exam initiated at 7:52 PM.  Appropriate orders placed.  Brandon [name] was informed that the remainder of the evaluation will be completed by another provider, this initial triage assessment does not replace that evaluation, and the importance of remaining in the ED until their evaluation is complete.     Kendrick Pax, [place] 03/27/24 1954

## 2024-03-28 DIAGNOSIS — T82898A Other specified complication of vascular prosthetic devices, implants and grafts, initial encounter: Secondary | ICD-10-CM | POA: Diagnosis not present

## 2024-03-28 DIAGNOSIS — Z85528 Personal history of other malignant neoplasm of kidney: Secondary | ICD-10-CM | POA: Diagnosis not present

## 2024-03-28 DIAGNOSIS — G4730 Sleep apnea, unspecified: Secondary | ICD-10-CM | POA: Diagnosis present

## 2024-03-28 DIAGNOSIS — E1122 Type 2 diabetes mellitus with diabetic chronic kidney disease: Secondary | ICD-10-CM | POA: Diagnosis present

## 2024-03-28 DIAGNOSIS — Z992 Dependence on renal dialysis: Secondary | ICD-10-CM

## 2024-03-28 DIAGNOSIS — K219 Gastro-esophageal reflux disease without esophagitis: Secondary | ICD-10-CM | POA: Diagnosis present

## 2024-03-28 DIAGNOSIS — N186 End stage renal disease: Secondary | ICD-10-CM | POA: Diagnosis present

## 2024-03-28 DIAGNOSIS — M898X9 Other specified disorders of bone, unspecified site: Secondary | ICD-10-CM | POA: Diagnosis present

## 2024-03-28 DIAGNOSIS — F339 Major depressive disorder, recurrent, unspecified: Secondary | ICD-10-CM | POA: Diagnosis present

## 2024-03-28 DIAGNOSIS — E875 Hyperkalemia: Secondary | ICD-10-CM | POA: Diagnosis not present

## 2024-03-28 DIAGNOSIS — E079 Disorder of thyroid, unspecified: Secondary | ICD-10-CM | POA: Diagnosis present

## 2024-03-28 DIAGNOSIS — T82510A Breakdown (mechanical) of surgically created arteriovenous fistula, initial encounter: Secondary | ICD-10-CM | POA: Diagnosis present

## 2024-03-28 DIAGNOSIS — Z833 Family history of diabetes mellitus: Secondary | ICD-10-CM | POA: Diagnosis not present

## 2024-03-28 DIAGNOSIS — Z885 Allergy status to narcotic agent status: Secondary | ICD-10-CM | POA: Diagnosis not present

## 2024-03-28 DIAGNOSIS — I152 Hypertension secondary to endocrine disorders: Secondary | ICD-10-CM

## 2024-03-28 DIAGNOSIS — N250 Renal osteodystrophy: Secondary | ICD-10-CM | POA: Diagnosis not present

## 2024-03-28 DIAGNOSIS — T82818A Embolism of vascular prosthetic devices, implants and grafts, initial encounter: Secondary | ICD-10-CM | POA: Diagnosis present

## 2024-03-28 DIAGNOSIS — E1159 Type 2 diabetes mellitus with other circulatory complications: Secondary | ICD-10-CM | POA: Diagnosis present

## 2024-03-28 DIAGNOSIS — D631 Anemia in chronic kidney disease: Secondary | ICD-10-CM | POA: Diagnosis present

## 2024-03-28 DIAGNOSIS — Z8249 Family history of ischemic heart disease and other diseases of the circulatory system: Secondary | ICD-10-CM | POA: Diagnosis not present

## 2024-03-28 DIAGNOSIS — I120 Hypertensive chronic kidney disease with stage 5 chronic kidney disease or end stage renal disease: Secondary | ICD-10-CM | POA: Diagnosis present

## 2024-03-28 DIAGNOSIS — Z794 Long term (current) use of insulin: Secondary | ICD-10-CM | POA: Diagnosis not present

## 2024-03-28 DIAGNOSIS — E785 Hyperlipidemia, unspecified: Secondary | ICD-10-CM | POA: Diagnosis present

## 2024-03-28 DIAGNOSIS — Y712 Prosthetic and other implants, materials and accessory cardiovascular devices associated with adverse incidents: Secondary | ICD-10-CM | POA: Diagnosis present

## 2024-03-28 DIAGNOSIS — Z8673 Personal history of transient ischemic attack (TIA), and cerebral infarction without residual deficits: Secondary | ICD-10-CM | POA: Diagnosis not present

## 2024-03-28 DIAGNOSIS — Z79899 Other long term (current) drug therapy: Secondary | ICD-10-CM | POA: Diagnosis not present

## 2024-03-28 DIAGNOSIS — T82398A Other mechanical complication of other vascular grafts, initial encounter: Secondary | ICD-10-CM | POA: Diagnosis not present

## 2024-03-28 DIAGNOSIS — N2581 Secondary hyperparathyroidism of renal origin: Secondary | ICD-10-CM | POA: Diagnosis present

## 2024-03-28 DIAGNOSIS — Y832 Surgical operation with anastomosis, bypass or graft as the cause of abnormal reaction of the patient, or of later complication, without mention of misadventure at the time of the procedure: Secondary | ICD-10-CM | POA: Diagnosis present

## 2024-03-28 DIAGNOSIS — G40909 Epilepsy, unspecified, not intractable, without status epilepticus: Secondary | ICD-10-CM | POA: Diagnosis present

## 2024-03-28 LAB — CBG MONITORING, ED: Glucose-Capillary: 118 mg/dL — ABNORMAL HIGH (ref 70–99)

## 2024-03-28 LAB — CBC
HCT: 38 % — ABNORMAL LOW (ref 39.0–52.0)
Hemoglobin: 13 g/dL (ref 13.0–17.0)
MCH: 31.6 pg (ref 26.0–34.0)
MCHC: 34.2 g/dL (ref 30.0–36.0)
MCV: 92.5 fL (ref 80.0–100.0)
Platelets: 223 10*3/uL (ref 150–400)
RBC: 4.11 MIL/uL — ABNORMAL LOW (ref 4.22–5.81)
RDW: 12.6 % (ref 11.5–15.5)
WBC: 4.8 10*3/uL (ref 4.0–10.5)
nRBC: 0 % (ref 0.0–0.2)

## 2024-03-28 LAB — CREATININE, SERUM
Creatinine, Ser: 16.21 mg/dL — ABNORMAL HIGH (ref 0.61–1.24)
GFR, Estimated: 3 mL/min — ABNORMAL LOW (ref >60)

## 2024-03-28 LAB — HIV ANTIBODY (ROUTINE TESTING W REFLEX): HIV Screen 4th Generation wRfx: NONREACTIVE

## 2024-03-28 LAB — HEPATITIS B SURFACE ANTIGEN: Hepatitis B Surface Ag: NONREACTIVE

## 2024-03-28 MED ORDER — AMLODIPINE BESYLATE 10 MG PO TABS: 10.0000 mg | ORAL_TABLET | Freq: Every day | ORAL | Status: DC | PRN

## 2024-03-28 MED ORDER — SERTRALINE HCL 100 MG PO TABS
100.0000 mg | ORAL_TABLET | Freq: Every day | ORAL | Status: DC
  Administered 2024-03-28 – 2024-03-30 (×2): 100 mg via ORAL
  Filled 2024-03-28 (×2): qty 1

## 2024-03-28 MED ORDER — ALTEPLASE 2 MG IJ SOLR: 2.0000 mg | Freq: Once | INTRAMUSCULAR | Status: DC | PRN

## 2024-03-28 MED ORDER — ANTICOAGULANT SODIUM CITRATE 4% (200MG/5ML) IV SOLN: 5.0000 mL | Status: DC | PRN

## 2024-03-28 MED ORDER — INSULIN ASPART 100 UNIT/ML IJ SOLN: 0.0000 [IU] | Freq: Three times a day (TID) | INTRAMUSCULAR | Status: DC

## 2024-03-28 MED ORDER — LIDOCAINE HCL (PF) 1 % IJ SOLN: 5.0000 mL | INTRAMUSCULAR | Status: DC | PRN

## 2024-03-28 MED ORDER — SEVELAMER CARBONATE 800 MG PO TABS
1600.0000 mg | ORAL_TABLET | Freq: Three times a day (TID) | ORAL | Status: DC
Start: 2024-03-28 — End: 2024-03-30
  Administered 2024-03-30: 1600 mg via ORAL
  Filled 2024-03-28 (×2): qty 2

## 2024-03-28 MED ORDER — HEPARIN SODIUM (PORCINE) 5000 UNIT/ML IJ SOLN
5000.0000 [IU] | Freq: Three times a day (TID) | INTRAMUSCULAR | Status: DC
  Administered 2024-03-28 – 2024-03-30 (×4): 5000 [IU] via SUBCUTANEOUS
  Filled 2024-03-28 (×4): qty 1

## 2024-03-28 MED ORDER — PENTAFLUOROPROP-TETRAFLUOROETH EX AERO: 1.0000 | INHALATION_SPRAY | CUTANEOUS | Status: DC | PRN

## 2024-03-28 MED ORDER — HEPARIN SODIUM (PORCINE) 1000 UNIT/ML DIALYSIS: 1000.0000 [IU] | INTRAMUSCULAR | Status: DC | PRN

## 2024-03-28 MED ORDER — LIDOCAINE-PRILOCAINE 2.5-2.5 % EX CREA: 1.0000 | TOPICAL_CREAM | CUTANEOUS | Status: DC | PRN

## 2024-03-28 MED ORDER — OXYCODONE HCL 5 MG PO TABS
5.0000 mg | ORAL_TABLET | Freq: Once | ORAL | Status: AC
  Administered 2024-03-28: 5 mg via ORAL
  Filled 2024-03-28: qty 1

## 2024-03-28 MED ORDER — CHLORHEXIDINE GLUCONATE CLOTH 2 % EX PADS: 6.0000 | MEDICATED_PAD | Freq: Every day | CUTANEOUS | Status: DC

## 2024-03-28 NOTE — ED Provider Notes (Addendum)
 [place] EMERGENCY DEPARTMENT AT [facility] Provider Note   CSN: [id number] Arrival date & time: 03/27/24  1832     History  Chief complaint: AV fistula not working properly.  Brandon [name] is a 63 y.o. male.  HPI   Patient has a history of hypertension diverticulitis, stroke, diabetes, hyperlipidemia,   renal cancer.  Patient states he has been on dialysis for about a year.  Patient states he last went to dialysis on Saturday.  He went Tuesday but they are having difficulty with his left AV fistula and could not dialyze him.  Patient was told he needed to have his AV fistula evaluated.  Patient states he had an appointment with vascular but is not until May 9.  Patient states he was told to come to the emergency room to have his AV fistula evaluated.  He denies any chest pain or shortness of breath.  No fevers or chills.  No swelling.  Home Medications Prior to Admission medications   Medication Sig Start Date End Date Taking? Authorizing Provider  amLODipine  (NORVASC ) 10 MG tablet TAKE 2 TABLETS BY MOUTH EVERY DAY Patient not taking: Reported on 03/04/2024 03/27/23   Boscia, Heather E, NP  atorvastatin  (LIPITOR) 40 MG tablet Take 1 tablet (40 mg total) by mouth daily. Patient taking differently: Take 40 mg by mouth See admin instructions. Take 1 tablet by mouth once or twice weekly 08/01/22   Sharyon Deis, NP  blood glucose meter kit and supplies Dispense based on patient and insurance preference. Use up to four times daily as directed. (FOR ICD-9 250.00, 250.01). Patient taking differently: 1 each by Other route See admin instructions. Dispense based on patient and insurance preference. Use up to four times daily as directed. (FOR ICD-9 250.00, 250.01). 04/24/16   Armenta Landau, MD  Continuous Glucose Sensor (DEXCOM G7 SENSOR) MISC Apply every 15 days 10/05/23   Laneta Pintos, MD  glucose blood [facility] ULTRA) test strip Blood sugar testing done TID for  insulin  dependant Type @ diabetes - E11.65 04/12/22   Sharyon Deis, NP  insulin  aspart (NOVOLOG ) 100 UNIT/ML injection Inject 25-40 Units into the skin 2 (two) times a week.    [provider]  Lancets (ACCU-CHEK SOFT TOUCH) lancets Use as instructed Patient taking differently: 1 each by Other route as directed. 06/11/19   Tysinger, Christiane Cowing, PA-C  multivitamin (RENA-VIT) TABS tablet Take 1 tablet by mouth at bedtime. 11/09/22   Sharyon Deis, NP  pantoprazole  (PROTONIX ) 40 MG tablet Take 1 tablet (40 mg total) by mouth daily. Patient taking differently: Take 40 mg by mouth daily as needed (for acid reflux). 10/16/22   Singh, Prashant K, MD  sertraline  (ZOLOFT ) 100 MG tablet TAKE 1 TABLET BY MOUTH EVERY DAY 12/28/23   Laneta Pintos, MD  sevelamer  carbonate (RENVELA ) 800 MG tablet Take 1,600 mg by mouth 3 (three) times daily with meals. 12/12/23   [provider]      Allergies    Hydralazine , Imdur  [isosorbide  nitrate], and Morphine     Review of Systems   Review of Systems  Physical Exam Updated Vital Signs BP (!) 140/93 (BP Location: Right Arm)   Pulse 66   Temp (!) 97.4 F (36.3 C)   Resp 16   Ht 1.829 m (6')   Wt 94.8 kg   SpO2 100%   BMI 28.35 kg/m  Physical Exam Vitals and nursing note reviewed.  Constitutional:  General: He is not in acute distress.    Appearance: He is well-developed.  HENT:     Head: Normocephalic and atraumatic.     Right Ear: External ear normal.     Left Ear: External ear normal.  Eyes:     General: No scleral icterus.       Right eye: No discharge.        Left eye: No discharge.     Conjunctiva/sclera: Conjunctivae normal.  Neck:     Trachea: No tracheal deviation.  Cardiovascular:     Rate and Rhythm: Normal rate.  Pulmonary:     Effort: Pulmonary effort is normal. No respiratory distress.     Breath sounds: No stridor.  Abdominal:     General: There is no distension.  Musculoskeletal:        General: No  swelling or deformity.     Cervical back: Neck supple.     Comments: AV fistula with palpable pulse left upper extremity  Skin:    General: Skin is warm and dry.     Findings: No rash.  Neurological:     Mental Status: He is alert. Mental status is at baseline.     Cranial Nerves: No dysarthria or facial asymmetry.     Motor: No seizure activity.     ED Results / Procedures / Treatments   Labs (all labs ordered are listed, but only abnormal results are displayed) Labs Reviewed  CBC - Abnormal; Notable for the following components:      Result Value   RBC 3.67 (*)    Hemoglobin 11.6 (*)    HCT 35.2 (*)    All other components within normal limits  COMPREHENSIVE METABOLIC PANEL WITH GFR - Abnormal; Notable for the following components:   Chloride 96 (*)    Glucose, Bld 163 (*)    BUN 93 (*)    Creatinine, Ser 14.75 (*)    Calcium  7.7 (*)    AST 7 (*)    GFR, Estimated 3 (*)    Anion gap 19 (*)    All other components within normal limits  HEPATITIS B SURFACE ANTIGEN  HEPATITIS B SURFACE ANTIBODY, QUANTITATIVE    EKG None  Radiology No results found.  Procedures Procedures    Medications Ordered in ED Medications  Chlorhexidine  Gluconate Cloth 2 % PADS 6 each (has no administration in time range)    ED Course/ Medical Decision Making/ A&P Clinical Course as of 03/28/24 1530  Thu Mar 28, 2024  0954 Labs reviewed.  CBC normal.  Metabolic panel shows elevated BUN and creatinine consistent with his chronic kidney disease.  No hyperkalemia [JK]  1006 Case reviewed with Dr. Edson Graces.  He will review case to see what neck steps are for this patient [JK]  1038 Nephrology evaluated patient at the bedside.  Plan is for dialysis today as patient primarily seems to have bruising and no clotting of his fistula.  Will hold in the ED pending dialysis [JK]  1530 I was notified that nephrology was not able to dialyze him today.  They are going to need to have IR assess his AV  fistula.  Patient will require admission to the hospital for further treatment [JK]    Clinical Course User Index [JK] Trish Furl, MD                                 Medical Decision Making  Risk Decision regarding hospitalization.   Patient presented to the ED for evaluation of his AV fistula.  Patient had dialysis on Saturday but on Tuesday they were not but able to access properly.  Patient was evaluated by nephrology at the bedside.  He has good thrill and pulse in his AV fistula.  Probably team feels patient had some bruising at his site.  Plan is to bring him up to the hemodialysis center in the hospital this afternoon to make sure he can receive dialysis appropriately.  Patient remained stable he has no complaints.  Will monitor in the ED while he is waiting for dialysis.  Anticipate discharge home after his dialysis is complete   4:09 PM Unfortunately they were not able to access his AV fistula.  Patient will need IR assessment for possible thrombosis of his AV fistula.  Nephrology will continue to follow along.  Have spoken with the hospitalist service Dr. Sulema Endo regarding admission     Final Clinical Impression(s) / ED Diagnoses Final diagnoses:  Stage 5 chronic kidney disease on chronic dialysis [facility])    Rx / DC Orders ED Discharge Orders     None         Trish Furl, MD 03/28/24 1042    Trish Furl, MD 03/28/24 [id number]

## 2024-03-28 NOTE — H&P (Signed)
 History and Physical    Patient: Brandon [name] ZOX:[id number] DOB: [birth year] DOA: 03/27/2024 DOS: the patient was seen and examined on 03/28/2024 PCP: Laneta Pintos, MD  Patient coming from: Home  Chief Complaint: No chief complaint on file.  HPI: Brandon [name] is a 63 y.o. male with medical history significant of HTN, T2DM, and ESRD p/w poorly functioning LUE brachiocephalic fistula and warrants admission for access and dialysis.  Pt states that he has had difficulty obtaining consistent HD access since switching to his new dialysis center in [place], [place] weeks ago. He reports that his last full HD session, which typically lasts 3-4 hours, was completed on Saturday 4/19, and that his sessions on 4/22 and 4/24 were unsuccessful due to inability of the staff to access his AV fistula; as such, the pt presented to the ED for dialysis iso HD access issues. Pt was evaluate by nephrology who attempted to access fistula, but were unable; as such, he was admitted to medicine in order for renal to discuss balloon angioplasty with IR. Pt denies cp, SOB, or BLE swelling.  Review of Systems: As mentioned in the history of present illness. All other systems reviewed and are negative.  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 ischemic stroke   GERD (gastroesophageal reflux disease)    Hiatal hernia    Hyperlipidemia    Hypertension    Seizures (HCC)    none since 2016, taking Keppra  - maw   Sleep apnea    Thyroid   disease    "weak thyroid " per MD   Wears glasses    Past Surgical History:  Procedure Laterality Date   AV FISTULA PLACEMENT Left 10/14/2022   Procedure: LEFT BRACHIOCAPHALIC FISTULA CREATION;  Surgeon: Young Hensen, MD;  Location: [facility] OR;  Service: Vascular;  Laterality: Left;   BREAST BIOPSY Left 03/24/2023   US  LT BREAST BX W LOC DEV 1ST LESION IMG BX SPEC US  GUIDE 03/24/2023 [facility] MAMMOGRAPHY   COLONOSCOPY     IR FLUORO GUIDE CV LINE RIGHT  10/06/2022   IR RADIOLOGIST EVAL & MGMT  04/04/2017   IR US  GUIDE VASC ACCESS RIGHT  10/06/2022   KIDNEY SURGERY     ablation of renal cell CA - 12/28, prior one was October 2012-Dr. Yamagata   TEE WITHOUT CARDIOVERSION N/A 11/19/2013   Procedure: TRANSESOPHAGEAL ECHOCARDIOGRAM (TEE);  Surgeon: Lenise Quince, MD;  [facility] END[facility]COPY;  Service: Cardiovascular;  Laterality: N/A;   Social History:  reports that he has never smoked. He has never been exposed to tobacco smoke. He has never used smokeless tobacco. He reports that he does not currently use alcohol. He reports that he does not use drugs.  Allergies  Allergen Reactions   Apresoline  [Hydralazine ] Other (See Comments)    Chest pain if by mouth- can tolerate it via IV   Imdur  [Isosorbide  Nitrate] Other (See Comments)    Headaches  Ms Contin  [Morphine ] Itching    Family History  Problem Relation Age of Onset   Pneumonia Mother    Breast cancer Mother    Diabetes Father    Heart disease Father    Breast cancer Sister    Prostate cancer Other    Stomach cancer Other    Colon cancer Neg Hx    Esophageal cancer Neg Hx    Rectal cancer Neg Hx     Prior to Admission medications   Medication Sig Start Date End Date Taking? Authorizing Provider  amLODipine  (NORVASC ) 10 MG tablet TAKE 2 TABLETS BY MOUTH EVERY DAY Patient taking differently: Take 10 mg by mouth daily as needed (SBP >= 120). 03/27/23  Yes Boscia, Heather E, NP  insulin  aspart (NOVOLOG  FLEXPEN) 100 UNIT/ML  FlexPen Inject 0-25 Units into the skin 3 (three) times daily as needed (blood sugar > 200).   Yes [provider]  naproxen sodium (ALEVE) 220 MG tablet Take 440 mg by mouth 2 (two) times daily as needed (pain).   Yes [provider]  sertraline  (ZOLOFT ) 100 MG tablet TAKE 1 TABLET BY MOUTH EVERY DAY 12/28/23  Yes Laneta Pintos, MD  sevelamer  carbonate (RENVELA ) 800 MG tablet Take 1,600 mg by mouth 3 (three) times daily with meals. 12/12/23  Yes [provider]  glucose blood (ONETOUCH ULTRA) test strip Blood sugar testing done TID for insulin  dependant Type @ diabetes - E11.65 04/12/22   Sharyon Deis, NP  Lancets (ACCU-CHEK SOFT TOUCH) lancets Use as instructed Patient taking differently: 1 each by Other route as directed. 06/11/19   Claudene Crystal, PA-C    Physical Exam: Vitals:   03/28/24 0257 03/28/24 0829 03/28/24 1201 03/28/24 1359  BP: (!) 146/81 (!) 140/93 (!) 153/88 (!) 156/95  Pulse: 67 66 70 74  Resp: 20 16 15 19   Temp: (!) 97.3 F (36.3 C) (!) 97.4 F (36.3 C) 97.6 F (36.4 C) (!) 97.4 F (36.3 C)  TempSrc:   Oral   SpO2: 100% 100% 100% 100%  Weight:      Height:       General: Alert, oriented x3, resting comfortably in no acute distress Respiratory: Lungs clear to auscultation bilaterally with normal respiratory effort; no w/r/r Cardiovascular: Regular rate and rhythm w/o m/r/g MSK: LUE AV fistula with palpable thrill  Data Reviewed:  Lab Results  Component Value Date   WBC 5.7 03/27/2024   HGB 11.6 (L) 03/27/2024   HCT 35.2 (L) 03/27/2024   MCV 95.9 03/27/2024   PLT 225 03/27/2024   Lab Results  Component Value Date   GLUCOSE 163 (H) 03/27/2024   CALCIUM  7.7 (L) 03/27/2024   NA 139 03/27/2024   K 4.6 03/27/2024   CO2 24 03/27/2024   CL 96 (L) 03/27/2024   BUN 93 (H) 03/27/2024   CREATININE 14.75 (H) 03/27/2024   Lab Results  Component Value Date   ALT 12 03/27/2024   AST 7 (L) 03/27/2024   ALKPHOS 88 03/27/2024    BILITOT 0.9 03/27/2024   Lab Results  Component Value Date   INR 1.2 11/29/2021   INR 1.3 (H) 07/04/2021   INR 1.17 01/08/2016    Radiology: No results found.   Assessment and Plan: Brandon [name] is a 63 y.o. male with medical history significant of HTN, T2DM, and ESRD p/w poorly functioning LUE brachiocephalic fistula and warrants admission for access and dialysis.  #ESRD #AV fistula occlusion -Renal consulted; recs: admit to medicine; will  engage IR to consider balloon angioplasty prior to HD planned for 4/25 -IR consulted per renal; recs: pending -PTA Renvela  1600mg  TID  #H/o HTN -PTA amlodipine   #H/o depression -PTA Zoloft    Advance Care Planning:   Code Status: Full Code   Consults: Renal  Family Communication: N/A  Severity of Illness: The appropriate patient status for this patient is OBSERVATION. Observation status is judged to be reasonable and necessary in order to provide the required intensity of service to ensure the patient's safety. The patient's presenting symptoms, physical exam findings, and initial radiographic and laboratory data in the context of their medical condition is felt to place them at decreased risk for further clinical deterioration. Furthermore, it is anticipated that the patient will be medically stable for discharge from the hospital within 2 midnights of admission.   Author: Arne Langdon, MD 03/28/2024 4:56 PM  For on call review [email or web address].

## 2024-03-28 NOTE — Consult Note (Addendum)
 [facility] Renal Consultation Note    Indication for Consultation:  Management of ESRD/hemodialysis, anemia, hypertension/volume, and secondary hyperparathyroidism. PCP:  HPI: Brandon [name] is a 63 y.o. male with ESRD, T2DM, HTN, seizure disorder, GERD.  Presented to ED on 4/23 with concern for "fistula not working." Last HD was Saturday 4/19. On Tues 4/22, went to HD and told that his AVF was clotted. He has had ongoing cannulation issues and many infiltrations. Does have vascular surgery evaluation scheduled for next week. He reports his HD unit directed him to the hospital.  Seen in ED bed initially - noted to have good bruit and thrill to LUE AAVF although was obvious that had had recent infiltration (bruising). Plan was discussed to bring him up to the HD unit and see if could be dialyzed, then discharged home with outpatient follow up. Unfortunately, brought up to HD unit and staff tried 4 times to stick him unsuccessfully.  Seen in HD unit at that time, no CP/dyspnea, no abd pain, N/V/D. He is frustrated and would like to be admitted and see IR here. Discussed plan with ED Dr. Monnie Anthony.  Dialyzes at [facility]) on TTS schedule.  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   left and right; cryoablation bil   Depression    [facility] Adm in [place] Depression   Diabetes mellitus    DKA prior hospitalization   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 ischemic stroke   GERD (gastroesophageal reflux disease)    Hiatal hernia    Hyperlipidemia    Hypertension    Seizures (HCC)    none since 2016, taking Keppra  - maw   Sleep apnea    Thyroid  disease    "weak  thyroid " per MD   Wears glasses    Past Surgical History:  Procedure Laterality Date   AV FISTULA PLACEMENT Left 10/14/2022   Procedure: LEFT BRACHIOCAPHALIC FISTULA CREATION;  Surgeon: Young Hensen, MD;  Location: [facility] OR;  Service: Vascular;  Laterality: Left;   BREAST BIOPSY Left 03/24/2023   US  LT BREAST BX W LOC DEV 1ST LESION IMG BX SPEC US  GUIDE 03/24/2023 [facility] MAMMOGRAPHY   COLONOSCOPY     IR FLUORO GUIDE CV LINE RIGHT  10/06/2022   IR RADIOLOGIST EVAL & MGMT  04/04/2017   IR US  GUIDE VASC ACCESS RIGHT  10/06/2022   KIDNEY SURGERY     ablation of renal cell CA - 12/28, prior one was October 2012-Dr. Yamagata   TEE WITHOUT CARDIOVERSION N/A 11/19/2013   Procedure: TRANSESOPHAGEAL ECHOCARDIOGRAM (TEE);  Surgeon: Lenise Quince, MD;  Location: [facility] ENDOSCOPY;  Service: Cardiovascular;  Laterality: N/A;   Family History  Problem Relation Age of Onset   Pneumonia Mother    Breast cancer Mother    Diabetes Father    Heart disease Father    Breast cancer Sister    Prostate cancer Other    Stomach cancer Other    Colon cancer Neg Hx    Esophageal cancer Neg Hx    Rectal cancer Neg Hx    Social History:  reports that he has never smoked. He  has never been exposed to tobacco smoke. He has never used smokeless tobacco. He reports that he does not currently use alcohol. He reports that he does not use drugs.  ROS: As per HPI otherwise negative.  Physical Exam: Vitals:   03/28/24 0257 03/28/24 0829 03/28/24 1201 03/28/24 1359  BP: (!) 146/81 (!) 140/93 (!) 153/88 (!) 156/95  Pulse: 67 66 70 74  Resp: 20 16 15 19   Temp: (!) 97.3 F (36.3 C) (!) 97.4 F (36.3 C) 97.6 F (36.4 C) (!) 97.4 F (36.3 C)  TempSrc:   Oral   SpO2: 100% 100% 100% 100%  Weight:      Height:         General: Well developed, well nourished, in no acute distress. Room air. Head: Normocephalic, atraumatic, sclera non-icteric, mucus membranes are moist. Neck: Supple without lymphadenopathy/masses.  JVD not elevated. Lungs: Clear bilaterally to auscultation without wheezes, rales, or rhonchi. Breathing is unlabored. Heart: RRR with normal S1, S2. No murmurs, rubs, or gallops appreciated. Abdomen: Soft, non-tender, non-distended with normoactive bowel sounds. No rebound/guarding.  Musculoskeletal:  Strength and tone appear normal for age. Lower extremities: No edema or ischemic changes, no open wounds. Neuro: Alert and oriented X 3. Moves all extremities spontaneously. Psych:  Responds to questions appropriately with a normal affect. Dialysis Access: LUE AVF +t/b, bruising present  Allergies  Allergen Reactions   Apresoline  [Hydralazine ] Other (See Comments)    Chest pain if by mouth- can tolerate it via IV   Imdur  [Isosorbide  Nitrate] Other (See Comments)    Headaches   Ms Contin  [Morphine ] Itching   Prior to Admission medications   Medication Sig Start Date End Date Taking? Authorizing Provider  amLODipine  (NORVASC ) 10 MG tablet TAKE 2 TABLETS BY MOUTH EVERY DAY Patient taking differently: Take 10 mg by mouth daily as needed (SBP >= 120). 03/27/23  Yes Boscia, Heather E, NP  insulin  aspart (NOVOLOG  FLEXPEN) 100 UNIT/ML FlexPen Inject 0-25 Units into the skin 3 (three) times daily as needed (blood sugar > 200).   Yes [provider]  naproxen sodium (ALEVE) 220 MG tablet Take 440 mg by mouth 2 (two) times daily as needed (pain).   Yes [provider]  sertraline  (ZOLOFT ) 100 MG tablet TAKE 1 TABLET BY MOUTH EVERY DAY 12/28/23  Yes Laneta Pintos, MD  sevelamer  carbonate (RENVELA ) 800 MG tablet Take 1,600 mg by mouth 3 (three) times daily with meals. 12/12/23  Yes [provider]  glucose blood (ONETOUCH ULTRA) test strip Blood sugar testing done TID for insulin  dependant Type @ diabetes - E11.65 04/12/22   Sharyon Deis, NP  Lancets (ACCU-CHEK SOFT TOUCH) lancets Use as instructed Patient taking differently: 1 each by Other route as directed. 06/11/19    Tysinger, Christiane Cowing, PA-C   Current Facility-Administered Medications  Medication Dose Route Frequency Provider Last Rate Last Admin   alteplase  (CATHFLO ACTIVASE ) injection 2 mg  2 mg Intracatheter Once PRN Stovall, Kathryn R, PA-C       anticoagulant sodium citrate  solution 5 mL  5 mL Intracatheter PRN Stovall, Kathryn R, PA-C       Chlorhexidine  Gluconate Cloth 2 % PADS 6 each  6 each Topical Q0600 Stovall, Kathryn R, PA-C       heparin  injection 1,000 Units  1,000 Units Intracatheter PRN Stovall, Kathryn R, PA-C       lidocaine  (PF) (XYLOCAINE ) 1 % injection 5 mL  5 mL Intradermal PRN Stovall, Kathryn R, PA-C  lidocaine -prilocaine  (EMLA ) cream 1 Application  1 Application Topical PRN Stovall, Kathryn R, PA-C       pentafluoroprop-tetrafluoroeth (GEBAUERS) aerosol 1 Application  1 Application Topical PRN Stovall, Kathryn R, PA-C       Current Outpatient Medications  Medication Sig Dispense Refill   amLODipine  (NORVASC ) 10 MG tablet TAKE 2 TABLETS BY MOUTH EVERY DAY (Patient taking differently: Take 10 mg by mouth daily as needed (SBP >= 120).) 180 tablet 1   insulin  aspart (NOVOLOG  FLEXPEN) 100 UNIT/ML FlexPen Inject 0-25 Units into the skin 3 (three) times daily as needed (blood sugar > 200).     naproxen sodium (ALEVE) 220 MG tablet Take 440 mg by mouth 2 (two) times daily as needed (pain).     sertraline  (ZOLOFT ) 100 MG tablet TAKE 1 TABLET BY MOUTH EVERY DAY 90 tablet 0   sevelamer  carbonate (RENVELA ) 800 MG tablet Take 1,600 mg by mouth 3 (three) times daily with meals.     glucose blood (ONETOUCH ULTRA) test strip Blood sugar testing done TID for insulin  dependant Type @ diabetes - E11.65 90 strip 5   Lancets (ACCU-CHEK SOFT TOUCH) lancets Use as instructed (Patient taking differently: 1 each by Other route as directed.) 100 each 12   Labs: Basic Metabolic Panel: Recent Labs  Lab 03/27/24 1959  NA 139  K 4.6  CL 96*  CO2 24  GLUCOSE 163*  BUN 93*  CREATININE 14.75*   CALCIUM  7.7*   Liver Function Tests: Recent Labs  Lab 03/27/24 1959  AST 7*  ALT 12  ALKPHOS 88  BILITOT 0.9  PROT 8.0  ALBUMIN 4.0   CBC: Recent Labs  Lab 03/27/24 1959  WBC 5.7  HGB 11.6*  HCT 35.2*  MCV 95.9  PLT 225    Dialysis Orders:  TTS - [facility]  Assessment/Plan:  Dysfunctional AVF/cannulation issues: Outpatient issues and unable to cannulate here today. IR consulted for fistulogram v. TDC placement. Pt agreeable to plan.  ESRD:  Usual TTS schedule - last HD was Saturday 4/19. K and volume ok, BUN rising.   Hypertension/volume: BP high, no acute dyspnea, but hasn't dialyzed in 5 days.  Anemia: Hgb > 11, ok for now  Metabolic bone disease: Ca low end, follow.  T2DM  Janus Mercury, Kirby Peoples 03/28/2024, 3:41 PM  [facility]   Seen and examined independently.  Agree with note and exam as documented above by Janus Mercury, PA and as noted here.  Mr. Cain is a 63 year old gentleman with a history of ESRD on hemodialysis Tuesday Thursday Saturday at [facility] in [place] who presented to the hospital after being unable to dialyze for the past several days.  His last dialysis session was Saturday, 4/19.  On Tuesday, 4/22 the staff were unable to access his fistula despite 3 attempts.  After being unable to access his AVF, the patient was sent to the ER.  Staff here at the inpatient dialysis unit tried 4 times unsuccessfully to cannulate him.  He has agreed to IR consult for fistulogram.  He had an appointment with an interventional list on 4/29 and was unable to get this moved up.  Previously lived in [place] and dialyzed at [facility].  He has had several recent infiltrations per charting.   General adult male in bed in no acute distress HEENT normocephalic atraumatic extraocular movements intact sclera anicteric Neck supple trachea midline Lungs clear to auscultation bilaterally normal work of breathing at rest on room air Heart S1S2 no  rub  Abdomen soft nontender nondistended Extremities no edema appreciated; no cyanosis or clubbing Psych normal mood and affect Access LUE AVF with bruit and thrill. Bruising noted  # Dysfunctional AVF - for fistulogram tomorrow with IR.  Appreciate their assistance.  May need tunneled catheter depending on findings  - I have placed an order for him to be NPO after 12:01 am on 4/25  # ESRD  - Plan for HD on 4/25 after fistulogram  - usually HD per TTS schedule - renal panel in AM   # HTN  - continue current regimen   # Anemia of CKD - No indication for ESA currently  - Hb acceptable at 11.6   # Metabolic bone disease  - he is on renvela   Disposition - needs fistulogram and possibly     Nan Aver, MD 03/28/2024  7:16 PM

## 2024-03-28 NOTE — Progress Notes (Signed)
 Pt was accessed and place on the machine but the access was not good due to the machine not running. Pt was de-accessed and the pt needles removed, pt sent to ED for further evaluation. Katy the PA was notified and she came to see the patient with further instruction.

## 2024-03-28 NOTE — Progress Notes (Signed)
 [name] quick note  S: Presented to ED 4/23 with concern for "clotted AVF" - unable to be dialyzed since Sat. No CP/dyspnea.  O: Blood pressure (!) 156/95, pulse 74, temperature (!) 97.4 F (36.3 C), resp. rate 19, height 6' (1.829 m), weight 94.8 kg, SpO2 100%.   A/P: ESRD: AVF with good bruit and thrill despite obvious recent infiltration. Brought up to HD unit to attempt cannulation. 4 attempts - unable to successfully cannulate.  Will send back down to ED. Needs f'gram in near future - will consult to see if can be done today. If not, then can by done with [facility] tomorrow at 11:30am. Provided options to patient - he prefers to wait to see if can be done today.  Janus Mercury, PA-C [employer] Pager [phone]

## 2024-03-29 ENCOUNTER — Inpatient Hospital Stay (HOSPITAL_COMMUNITY)

## 2024-03-29 DIAGNOSIS — D631 Anemia in chronic kidney disease: Secondary | ICD-10-CM | POA: Diagnosis not present

## 2024-03-29 DIAGNOSIS — N250 Renal osteodystrophy: Secondary | ICD-10-CM | POA: Diagnosis not present

## 2024-03-29 DIAGNOSIS — Z992 Dependence on renal dialysis: Secondary | ICD-10-CM | POA: Diagnosis not present

## 2024-03-29 DIAGNOSIS — T82898A Other specified complication of vascular prosthetic devices, implants and grafts, initial encounter: Secondary | ICD-10-CM | POA: Diagnosis not present

## 2024-03-29 DIAGNOSIS — T82398A Other mechanical complication of other vascular grafts, initial encounter: Secondary | ICD-10-CM | POA: Diagnosis not present

## 2024-03-29 DIAGNOSIS — I120 Hypertensive chronic kidney disease with stage 5 chronic kidney disease or end stage renal disease: Secondary | ICD-10-CM | POA: Diagnosis not present

## 2024-03-29 DIAGNOSIS — N186 End stage renal disease: Secondary | ICD-10-CM | POA: Diagnosis not present

## 2024-03-29 HISTORY — PX: IR DIALY SHUNT INTRO NEEDLE/INTRACATH INITIAL W/IMG LEFT: IMG6102

## 2024-03-29 LAB — GLUCOSE, CAPILLARY
Glucose-Capillary: 120 mg/dL — ABNORMAL HIGH (ref 70–99)
Glucose-Capillary: 104 mg/dL — ABNORMAL HIGH (ref 70–99)
Glucose-Capillary: 82 mg/dL (ref 70–99)
Glucose-Capillary: 72 mg/dL (ref 70–99)

## 2024-03-29 LAB — HEMOGLOBIN A1C
Hgb A1c MFr Bld: 7 % — ABNORMAL HIGH (ref 4.8–5.6)
Mean Plasma Glucose: 154.2 mg/dL

## 2024-03-29 LAB — RENAL FUNCTION PANEL
Albumin: 3.8 g/dL (ref 3.5–5.0)
Anion gap: 20 — ABNORMAL HIGH (ref 5–15)
BUN: 108 mg/dL — ABNORMAL HIGH (ref 6–20)
CO2: 21 mmol/L — ABNORMAL LOW (ref 22–32)
Calcium: 7.4 mg/dL — ABNORMAL LOW (ref 8.9–10.3)
Chloride: 96 mmol/L — ABNORMAL LOW (ref 98–111)
Creatinine, Ser: 16.26 mg/dL — ABNORMAL HIGH (ref 0.61–1.24)
GFR, Estimated: 3 mL/min — ABNORMAL LOW (ref >60)
Glucose, Bld: 126 mg/dL — ABNORMAL HIGH (ref 70–99)
Phosphorus: 7.7 mg/dL — ABNORMAL HIGH (ref 2.5–4.6)
Potassium: 5.3 mmol/L — ABNORMAL HIGH (ref 3.5–5.1)
Sodium: 137 mmol/L (ref 135–145)

## 2024-03-29 LAB — HEPATITIS B SURFACE ANTIBODY, QUANTITATIVE: Hep B S AB Quant (Post): 67.2 m[IU]/mL

## 2024-03-29 MED ORDER — LIDOCAINE HCL (PF) 1 % IJ SOLN: 5.0000 mL | INTRAMUSCULAR | Status: DC | PRN

## 2024-03-29 MED ORDER — OXYCODONE HCL 5 MG PO TABS
5.0000 mg | ORAL_TABLET | ORAL | Status: DC | PRN
  Administered 2024-03-29 – 2024-03-30 (×3): 5 mg via ORAL
  Filled 2024-03-29 (×3): qty 1

## 2024-03-29 MED ORDER — AMLODIPINE BESYLATE 10 MG PO TABS
10.0000 mg | ORAL_TABLET | Freq: Every day | ORAL | Status: DC
  Administered 2024-03-30: 10 mg via ORAL
  Filled 2024-03-29: qty 1

## 2024-03-29 MED ORDER — SODIUM ZIRCONIUM CYCLOSILICATE 10 G PO PACK
10.0000 g | PACK | Freq: Once | ORAL | Status: AC
  Administered 2024-03-29: 10 g via ORAL
  Filled 2024-03-29: qty 1

## 2024-03-29 MED ORDER — IOHEXOL 300 MG/ML  SOLN
100.0000 mL | Freq: Once | INTRAMUSCULAR | Status: AC | PRN
  Administered 2024-03-29: 60 mL via INTRAVENOUS

## 2024-03-29 NOTE — TOC CM/SW Note (Signed)
 Transition of Care [facility]) - Inpatient Brief Assessment   Patient Details  Name: Brandon [name] MRN: [id number] Date of Birth: [birth year]  [facility]) CM/SW Contact:    Tom-Johnson, Angelique Ken, RN Phone Number: 03/29/2024, 3:42 PM   Clinical Narrative:  Patient presented to the ED with Clotted Hemodialysis AV Fistula. Patient received a full HD session on 03/23/24 and his next 2 sessions were unsuccessful. Admitted for further intervention. IR following for de-clotting and and possible Tunneled HD Catheter placement. Nephrology following. Patient goes to outpatient HD on TTS schedule, states his friend, Edwina Gram drives him to and from HD.  From home alone, has three children. Independent with care. Has a cane and shower seat at home.  PCP is Laneta Pintos, MD and uses [facility] on [address].   No TOC needs or recommendations noted at this time.  Patient not Medically ready for discharge.  CM will continue to follow as patient progresses with care towards discharge.                 Transition of Care Asessment: Insurance and Status: Insurance coverage has been reviewed Patient has primary care physician: Yes Home environment has been reviewed: Yes Prior level of function:: Modified Independent Prior/Current Home Services: No current home services Social Drivers of Health Review: SDOH reviewed no interventions necessary Readmission risk has been reviewed: Yes Transition of care needs: no transition of care needs at this time

## 2024-03-29 NOTE — Sedation Documentation (Signed)
 Images taken and Dr Julietta Ogren reports no intervention needed. No sedation given and procedure is not needed.

## 2024-03-29 NOTE — Progress Notes (Signed)
 Post rinse-back

## 2024-03-29 NOTE — Progress Notes (Signed)
 PROGRESS NOTE  [name] DOB: 01/13/1961 DOA: 03/27/2024 PCP: Laneta Pintos, MD   LOS: 1 day   Brief Narrative / Interim history: Brandon [name] is a 63 y.o. male with medical history significant of HTN, T2DM, and ESRD p/w poorly functioning LUE brachiocephalic fistula.  His last full HD session was 4/19, and his next 2 ones were unsuccessful.  He eventually came to the ED.  IR consulted to evaluate fistula  Subjective / 24h Interval events: Doing well this morning, no chest pain, no shortness of breath  Assesement and Plan: Principal Problem:   ESRD (end stage renal disease) on dialysis [facility]) Active Problems:   Hypertension associated with diabetes (HCC)   Depression, recurrent (HCC)   AV fistula occlusion, initial encounter (HCC)   Principal problem Malfunctioning AV fistula -IR consulted, he will have further evaluation, possible intervention to his fistula versus possible tunneled HD catheter.  Active problems ESRD-he will need to have an HD session after IR intervention  Hyperkalemia-due to missed dialysis, Lokelma   Essential hypertension-continue amlodipine   History of depression-continue Zoloft   Scheduled Meds:  amLODipine   10 mg Oral Daily   Chlorhexidine  Gluconate Cloth  6 each Topical Q0600   heparin   5,000 Units Subcutaneous Q8H   insulin  aspart  0-6 Units Subcutaneous TID WC   sertraline   100 mg Oral Daily   sevelamer  carbonate  1,600 mg Oral TID WC   sodium zirconium cyclosilicate   10 g Oral Once   Continuous Infusions:  anticoagulant sodium citrate      PRN Meds:.alteplase , anticoagulant sodium citrate , heparin , lidocaine  (PF), lidocaine -prilocaine , oxyCODONE , pentafluoroprop-tetrafluoroeth  Current Outpatient Medications  Medication Instructions   amLODipine  (NORVASC ) 20 mg, Oral, Daily   glucose blood (ONETOUCH ULTRA) test strip Blood sugar testing done TID for insulin  dependant Type @ diabetes - E11.65   Lancets (ACCU-CHEK  SOFT TOUCH) lancets Use as instructed   naproxen sodium (ALEVE) 440 mg, 2 times daily PRN   NovoLOG  FlexPen 0-25 Units, Subcutaneous, 3 times daily PRN   sertraline  (ZOLOFT ) 100 mg, Oral, Daily   sevelamer  carbonate (RENVELA ) 1,600 mg, Oral, 3 times daily with meals    Diet Orders (From admission, onward)     Start     Ordered   03/29/24 0001  Diet NPO time specified  Diet effective midnight        03/28/24 1905            DVT prophylaxis: heparin  injection 5,000 Units Start: 03/28/24 1700   Lab Results  Component Value Date   PLT 223 03/28/2024      Code Status: Full Code  Family Communication: No family at bedside  Status is: Inpatient Remains inpatient appropriate because: IR intervention pending, full HD session pending   Level of care: Med-Surg  Consultants:  IR Nephrology  Objective: Vitals:   03/28/24 1915 03/28/24 2111 03/29/24 0503 03/29/24 0843  BP:  (!) 140/68 (!) 154/84 (!) 152/91  Pulse:  74 86 88  Resp:  18 19 16   Temp: 97.6 F (36.4 C) 97.8 F (36.6 C) 98.3 F (36.8 C) 98 F (36.7 C)  TempSrc: Oral Oral Oral Oral  SpO2:  100% 100% 98%  Weight:      Height:        Intake/Output Summary (Last 24 hours) at 03/29/2024 1411 Last data filed at 03/28/2024 2200 Gross per 24 hour  Intake 240 ml  Output 0 ml  Net 240 ml   Wt Readings from Last 3 Encounters:  03/27/24  94.8 kg  02/19/24 103.9 kg  10/05/23 91.1 kg    Examination:  Constitutional: NAD Eyes: no scleral icterus ENMT: Mucous membranes are moist.  Neck: normal, supple Respiratory: clear to auscultation bilaterally, no wheezing, no crackles.  Cardiovascular: Regular rate and rhythm, no murmurs / rubs / gallops. No LE edema.  Abdomen: non distended, no tenderness. Bowel sounds positive.  Musculoskeletal: no clubbing / cyanosis.    Data Reviewed: I have independently reviewed following labs and imaging studies   CBC Recent Labs  Lab 03/27/24 1959 03/28/24 2022  WBC 5.7  4.8  HGB 11.6* 13.0  HCT 35.2* 38.0*  PLT 225 223  MCV 95.9 92.5  MCH 31.6 31.6  MCHC 33.0 34.2  RDW 12.6 12.6    Recent Labs  Lab 03/27/24 1959 03/28/24 2022 03/29/24 0611  NA 139  --  137  K 4.6  --  5.3*  CL 96*  --  96*  CO2 24  --  21*  GLUCOSE 163*  --  126*  BUN 93*  --  108*  CREATININE 14.75* 16.21* 16.26*  CALCIUM  7.7*  --  7.4*  AST 7*  --   --   ALT 12  --   --   ALKPHOS 88  --   --   BILITOT 0.9  --   --   ALBUMIN 4.0  --  3.8  HGBA1C  --  7.0*  --     ------------------------------------------------------------------------------------------------------------------ No results for input(s): "CHOL", "HDL", "LDLCALC", "TRIG", "CHOLHDL", "LDLDIRECT" in the last 72 hours.  Lab Results  Component Value Date   HGBA1C 7.0 (H) 03/28/2024   ------------------------------------------------------------------------------------------------------------------ No results for input(s): "TSH", "T4TOTAL", "T3FREE", "THYROIDAB" in the last 72 hours.  Invalid input(s): "FREET3"  Cardiac Enzymes No results for input(s): "CKMB", "TROPONINI", "MYOGLOBIN" in the last 168 hours.  Invalid input(s): "CK" ------------------------------------------------------------------------------------------------------------------    Component Value Date/Time   BNP 252.2 (H) 02/19/2024 2000    CBG: Recent Labs  Lab 03/28/24 1725 03/29/24 0842 03/29/24 1137  GLUCAP 118* 120* 104*    No results found for this or any previous visit (from the past 240 hours).   Radiology Studies: No results found.   Kathlen Para, MD, PhD [facility]  Between 7 am - 7 pm I am available, please contact me via Amion (for emergencies) or Securechat (non urgent messages)  Between 7 pm - 7 am I am not available, please contact night coverage MD/APP via Amion

## 2024-03-29 NOTE — Procedures (Addendum)
 Interventional Radiology Procedure:   Indications: Left upper extremity fistulogram   Procedure: Difficult cannulation of fistula  Findings: Left upper arm AV fistula is patent.  Two large branch vessels coming off the left cephalic vein.  No focal stenosis. Central veins patent.  Limited evaluation of the anastomosis.  Complications: None     EBL: Minimal  Plan: Patent left arm AV fistula with prominent branch vessels.  No significant stenosis.   Ramon Brant R. Julietta Ogren, MD  Pager: [phone]

## 2024-03-29 NOTE — Progress Notes (Addendum)
 [facility] Progress Note   Subjective: No HD since 04/19 D/T dysfunctional AVF. Denies SOB. IR seeing patient now.    Objective Vitals:   03/28/24 1915 03/28/24 2111 03/29/24 0503 03/29/24 0843  BP:  (!) 140/68 (!) 154/84 (!) 152/91  Pulse:  74 86 88  Resp:  18 19 16   Temp: 97.6 F (36.4 C) 97.8 F (36.6 C) 98.3 F (36.8 C) 98 F (36.7 C)  TempSrc: Oral Oral Oral Oral  SpO2:  100% 100% 98%  Weight:      Height:       Physical Exam General: Well appearing male in NAD Heart: RRR  Lungs:CTAB A/P Abdomen:NABS NT Extremities:No LE edema Dialysis Access: L AVF + T/B   Additional Objective Labs: Basic Metabolic Panel: Recent Labs  Lab 03/27/24 1959 03/28/24 2022 03/29/24 0611  NA 139  --  137  K 4.6  --  5.3*  CL 96*  --  96*  CO2 24  --  21*  GLUCOSE 163*  --  126*  BUN 93*  --  108*  CREATININE 14.75* 16.21* 16.26*  CALCIUM  7.7*  --  7.4*  PHOS  --   --  7.7*   Liver Function Tests: Recent Labs  Lab 03/27/24 1959 03/29/24 0611  AST 7*  --   ALT 12  --   ALKPHOS 88  --   BILITOT 0.9  --   PROT 8.0  --   ALBUMIN 4.0 3.8   No results for input(s): "LIPASE", "AMYLASE" in the last 168 hours. CBC: Recent Labs  Lab 03/27/24 1959 03/28/24 2022  WBC 5.7 4.8  HGB 11.6* 13.0  HCT 35.2* 38.0*  MCV 95.9 92.5  PLT 225 223   Blood Culture    Component Value Date/Time   SDES  04/05/2020 2119    URINE, CLEAN CATCH Performed at [facility], [address], [place], [place] [address]    SPECREQUEST  04/05/2020 2119    NONE Performed at [facility], [address], [place], [place] [address]    CULT MULTIPLE SPECIES PRESENT, SUGGEST RECOLLECTION (A) 04/05/2020 2119   REPTSTATUS 04/06/2020 FINAL 04/05/2020 2119    Cardiac Enzymes: No results for input(s): "CKTOTAL", "CKMB", "CKMBINDEX", "TROPONINI" in the last 168 hours. CBG: Recent Labs  Lab 03/28/24 1725 03/29/24 0842  GLUCAP 118* 120*    Iron Studies: No results for input(s): "IRON", "TIBC", "TRANSFERRIN", "FERRITIN" in the last 72 hours. @lablastinr3 @ Studies/Results: No results found. Medications:  anticoagulant sodium citrate       amLODipine   10 mg Oral Daily   Chlorhexidine  Gluconate Cloth  6 each Topical Q0600   heparin   5,000 Units Subcutaneous Q8H   insulin  aspart  0-6 Units Subcutaneous TID WC   sertraline   100 mg Oral Daily   sevelamer  carbonate  1,600 mg Oral TID WC     Dialysis Orders:  TTS - [facility] [address]-Will call for orders   Assessment/Plan:  Dysfunctional AVF/cannulation issues: Outpatient issues and unable to cannulate here today. IR consulted for fistulogram v. TDC placement. Pt agreeable to plan.  Mild hyper kalemia- Will give lokelma  now. Follow labs  ESRD:  Usual TTS schedule - last HD was Saturday 4/19. K and volume ok, BUN rising. Will order HD for after intervention in I>   Hypertension/volume: BP improved at present. Not overtly volume overloaded., no acute dyspnea, but hasn't dialyzed in 5 days.  Anemia: Hgb > 11, ok for now  Metabolic bone disease: Ca low  end, follow.  T2DM  Adanna Zuckerman H. Ermine Spofford NP-C 03/29/2024, 9:02 AM  [employer] [phone]

## 2024-03-29 NOTE — Consult Note (Signed)
 Chief Complaint: Clotted dialysis fistula - IR consulted for fistula de-clot with possible intervention and possible tunneled HD catheter placement  Referring Provider(s): Nan Aver, MD   Supervising Physician: Alyssa Jumper  Patient Status: [facility] - In-pt  History of Present Illness: Brandon [name] is a 63 y.o. male with pmhx of ESRD on dialysis. DM2, HTN, seizure disorder, GERD. Pt reports having left arm fistula and being on dialysis for just over 1 year. Last HD was 4/19. On 4/22 dialysis was unable to access the fistula and he was told it was clotted. He was then referred to the ED for evaluation of fistula with need for possible new line access. IR now consulted for the fistulogram and possible intervention with possibility of new tunneled HD cath.   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   left and right; cryoablation bil   Depression    [facility] Adm in [place] Depression   Diabetes mellitus    DKA prior hospitalization   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 ischemic stroke   GERD (gastroesophageal reflux disease)    Hiatal hernia    Hyperlipidemia    Hypertension    Seizures (HCC)    none since 2016, taking Keppra  - maw   Sleep apnea    Thyroid  disease    "weak thyroid " per MD   Wears glasses     Past Surgical History:  Procedure Laterality Date   AV FISTULA PLACEMENT Left 10/14/2022   Procedure: LEFT BRACHIOCAPHALIC FISTULA CREATION;  Surgeon: Young Hensen, MD;  Location: [facility] OR;  Service: Vascular;  Laterality: Left;   BREAST BIOPSY Left 03/24/2023   US  LT BREAST BX W LOC DEV 1ST LESION IMG BX SPEC US  GUIDE 03/24/2023 [facility]  MAMMOGRAPHY   COLONOSCOPY     IR FLUORO GUIDE CV LINE RIGHT  10/06/2022   IR RADIOLOGIST EVAL & MGMT  04/04/2017   IR US  GUIDE VASC ACCESS RIGHT  10/06/2022   KIDNEY SURGERY     ablation of renal cell CA - 12/28, prior one was October 2012-Dr. Yamagata   TEE WITHOUT CARDIOVERSION N/A 11/19/2013   Procedure: TRANSESOPHAGEAL ECHOCARDIOGRAM (TEE);  Surgeon: Lenise Quince, MD;  Location: [facility] ENDOSCOPY;  Service: Cardiovascular;  Laterality: N/A;    Allergies: Apresoline  [hydralazine ], Imdur  [isosorbide  nitrate], and Ms contin  [morphine ]  Medications: Prior to Admission medications   Medication Sig Start Date End Date Taking? Authorizing Provider  amLODipine  (NORVASC ) 10 MG tablet TAKE 2 TABLETS BY MOUTH EVERY DAY Patient taking differently: Take 10 mg by mouth daily as needed (SBP >= 120). 03/27/23  Yes Boscia, Heather E, NP  insulin  aspart (NOVOLOG  FLEXPEN) 100 UNIT/ML FlexPen Inject 0-25 Units into the skin 3 (three) times daily as needed (blood sugar > 200).   Yes [provider]  naproxen sodium (ALEVE) 220 MG tablet Take 440 mg by mouth 2 (two) times daily as needed (pain).   Yes [provider]  sertraline  (ZOLOFT ) 100 MG tablet TAKE 1 TABLET BY MOUTH EVERY DAY 12/28/23  Yes Laneta Pintos, MD  sevelamer  carbonate (RENVELA ) 800 MG tablet Take  1,600 mg by mouth 3 (three) times daily with meals. 12/12/23  Yes [provider]  glucose blood (ONETOUCH ULTRA) test strip Blood sugar testing done TID for insulin  dependant Type @ diabetes - E11.65 04/12/22   Sharyon Deis, NP  Lancets (ACCU-CHEK SOFT TOUCH) lancets Use as instructed Patient taking differently: 1 each by Other route as directed. 06/11/19   Tysinger, Christiane Cowing, PA-C     Family History  Problem Relation Age of Onset   Pneumonia Mother    Breast cancer Mother    Diabetes Father    Heart disease Father    Breast cancer Sister    Prostate cancer Other    Stomach cancer Other    Colon cancer Neg Hx     Esophageal cancer Neg Hx    Rectal cancer Neg Hx     Social History   Socioeconomic History   Marital status: Single    Spouse name: Not on file   Number of children: 2   Years of education: college   Highest education level: Not on file  Occupational History   Occupation: disabled  Tobacco Use   Smoking status: Never    Passive exposure: Never   Smokeless tobacco: Never  Vaping Use   Vaping status: Never Used  Substance and Sexual Activity   Alcohol use: Not Currently    Comment: rarely   Drug use: No   Sexual activity: Never  Other Topics Concern   Not on file  Social History Narrative   Patient lives alone.   Education. Two years of college.   Not working.   Right handed.   Caffeine one soda daily. Drinks coffee    Social Drivers of Corporate investment banker Strain: Low Risk  (08/15/2023)   Overall Financial Resource Strain (CARDIA)    Difficulty of Paying Living Expenses: Not hard at all  Food Insecurity: No Food Insecurity (03/29/2024)   Hunger Vital Sign    Worried About Running Out of Food in the Last Year: Never true    Ran Out of Food in the Last Year: Never true  Transportation Needs: No Transportation Needs (03/29/2024)   PRAPARE - Administrator, Civil Service (Medical): No    Lack of Transportation (Non-Medical): No  Physical Activity: Sufficiently Active (08/15/2023)   Exercise Vital Sign    Days of Exercise per Week: 3 days    Minutes of Exercise per Session: 60 min  Stress: No Stress Concern Present (08/15/2023)   [employer] of Occupational Health - Occupational Stress Questionnaire    Feeling of Stress : Not at all  Social Connections: Moderately Integrated (08/15/2023)   Social Connection and Isolation Panel [NHANES]    Frequency of Communication with Friends and Family: More than three times a week    Frequency of Social Gatherings with Friends and Family: More than three times a week    Attends Religious Services: More than 4  times per year    Active Member of [employer] or Organizations: Yes    Attends Engineer, structural: More than 4 times per year    Marital Status: Never married     Review of Systems: A 12 point ROS discussed and pertinent positives are indicated in the HPI above.  All other systems are negative.    Vital Signs: BP (!) 152/91 (BP Location: Right Arm)   Pulse 88   Temp 98 F (36.7 C) (Oral)   Resp 16   Ht 6' (1.829  m)   Wt 209 lb (94.8 kg)   SpO2 98%   BMI 28.35 kg/m   Advance Care Plan: The advanced care place/surrogate decision maker was discussed at the time of visit and the patient did not wish to discuss or was not able to name a surrogate decision maker or provide an advance care plan.  Physical Exam Vitals and nursing note reviewed.  Constitutional:      Appearance: Normal appearance.  HENT:     Mouth/Throat:     Mouth: Mucous membranes are moist.     Pharynx: Oropharynx is clear.  Cardiovascular:     Rate and Rhythm: Normal rate and regular rhythm.  Pulmonary:     Effort: Pulmonary effort is normal.     Breath sounds: Normal breath sounds.  Abdominal:     Palpations: Abdomen is soft.     Tenderness: There is no abdominal tenderness. There is no guarding or rebound.  Musculoskeletal:     Right lower leg: No edema.     Left lower leg: No edema.     Comments: Fistula of left upper arm. No overlying abnormality. + palpable thrill. Neurovascularly intact   Skin:    General: Skin is warm and dry.  Neurological:     Mental Status: He is alert and oriented to person, place, and time. Mental status is at baseline.     Imaging: DG Chest Portable 1 View Result Date: 02/29/2024 CLINICAL DATA:  History of ESRD.  Missed recent dialysis. EXAM: PORTABLE CHEST 1 VIEW COMPARISON:  Chest radiograph dated 02/19/2024. FINDINGS: The heart size is upper limits of normal. Mediastinal contours are within normal limits. Central pulmonary vascular congestion with mildly  increased interstitial prominence. Mild left basilar atelectasis. No focal consolidation, sizeable pleural effusion, or pneumothorax. No acute osseous abnormality. IMPRESSION: Central pulmonary vascular congestion with possible mild interstitial edema, new since the prior exam. Electronically Signed   By: Mannie Seek M.D.   On: 02/29/2024 16:43    Labs:  CBC: Recent Labs    02/19/24 2000 02/29/24 1247 03/27/24 1959 03/28/24 2022  WBC 5.3 5.1 5.7 4.8  HGB 12.5* 10.9* 11.6* 13.0  HCT 39.1 32.9* 35.2* 38.0*  PLT 241 198 225 223    COAGS: No results for input(s): "INR", "APTT" in the last 8760 hours.  BMP: Recent Labs    02/19/24 2000 02/29/24 1247 03/27/24 1959 03/28/24 2022 03/29/24 0611  NA 141 139 139  --  137  K 6.9* 7.0* 4.6  --  5.3*  CL 94* 100 96*  --  96*  CO2 27 19* 24  --  21*  GLUCOSE 141* 133* 163*  --  126*  BUN 64* 113* 93*  --  108*  CALCIUM  8.3* 7.8* 7.7*  --  7.4*  CREATININE 15.39* 20.61* 14.75* 16.21* 16.26*  GFRNONAA 3* 2* 3* 3* 3*    LIVER FUNCTION TESTS: Recent Labs    02/29/24 1247 03/27/24 1959 03/29/24 0611  BILITOT 0.7 0.9  --   AST <5* 7*  --   ALT 8 12  --   ALKPHOS 58 88  --   PROT 7.1 8.0  --   ALBUMIN 3.5 4.0 3.8    TUMOR MARKERS: No results for input(s): "AFPTM", "CEA", "CA199", "CHROMGRNA" in the last 8760 hours.  Assessment and Plan:  Brandon [name] is a 63 y.o. male with pmhx of ESRD on dialysis. DM2, HTN, seizure disorder, GERD. Pt reports having left arm fistula and being on dialysis for  just over 1 year. Last HD was 4/19. On 4/22 dialysis was unable to access the fistula and he was told it was clotted. He was then referred to the ED for evaluation of fistula with need for possible new line access. IR now consulted for the fistulogram and possible intervention with possibility of new tunneled HD cath.  Risks and benefits discussed with the patient including, but not limited to bleeding, infection, vascular injury,  pulmonary embolism, need for tunneled HD catheter placement or even death.  All of the patient's questions were answered, patient is agreeable to proceed. Consent signed and in chart.   Thank you for allowing our service to participate in MICA RELEFORD 's care.  Electronically Signed: Nicolasa Barrett, PA-C   03/29/2024, 8:45 AM      I spent a total of 40 Minutes    in face to face in clinical consultation, greater than 50% of which was counseling/coordinating care for fistula declot with possible intervention, possible tunneled HD catheter.

## 2024-03-30 DIAGNOSIS — I120 Hypertensive chronic kidney disease with stage 5 chronic kidney disease or end stage renal disease: Secondary | ICD-10-CM | POA: Diagnosis not present

## 2024-03-30 DIAGNOSIS — Z992 Dependence on renal dialysis: Secondary | ICD-10-CM | POA: Diagnosis not present

## 2024-03-30 DIAGNOSIS — D631 Anemia in chronic kidney disease: Secondary | ICD-10-CM | POA: Diagnosis not present

## 2024-03-30 DIAGNOSIS — T82898A Other specified complication of vascular prosthetic devices, implants and grafts, initial encounter: Secondary | ICD-10-CM | POA: Diagnosis not present

## 2024-03-30 DIAGNOSIS — N186 End stage renal disease: Secondary | ICD-10-CM | POA: Diagnosis not present

## 2024-03-30 DIAGNOSIS — N250 Renal osteodystrophy: Secondary | ICD-10-CM | POA: Diagnosis not present

## 2024-03-30 LAB — COMPREHENSIVE METABOLIC PANEL WITH GFR
ALT: 9 U/L (ref 0–44)
AST: 6 U/L — ABNORMAL LOW (ref 15–41)
Albumin: 3.3 g/dL — ABNORMAL LOW (ref 3.5–5.0)
Alkaline Phosphatase: 58 U/L (ref 38–126)
Anion gap: 16 — ABNORMAL HIGH (ref 5–15)
BUN: 68 mg/dL — ABNORMAL HIGH (ref 6–20)
CO2: 23 mmol/L (ref 22–32)
Calcium: 7.5 mg/dL — ABNORMAL LOW (ref 8.9–10.3)
Chloride: 97 mmol/L — ABNORMAL LOW (ref 98–111)
Creatinine, Ser: 11.86 mg/dL — ABNORMAL HIGH (ref 0.61–1.24)
GFR, Estimated: 4 mL/min — ABNORMAL LOW (ref >60)
Glucose, Bld: 103 mg/dL — ABNORMAL HIGH (ref 70–99)
Potassium: 4.4 mmol/L (ref 3.5–5.1)
Sodium: 136 mmol/L (ref 135–145)
Total Bilirubin: 1 mg/dL (ref 0.0–1.2)
Total Protein: 6.8 g/dL (ref 6.5–8.1)

## 2024-03-30 LAB — CBC
HCT: 31.8 % — ABNORMAL LOW (ref 39.0–52.0)
Hemoglobin: 10.9 g/dL — ABNORMAL LOW (ref 13.0–17.0)
MCH: 31.5 pg (ref 26.0–34.0)
MCHC: 34.3 g/dL (ref 30.0–36.0)
MCV: 91.9 fL (ref 80.0–100.0)
Platelets: 198 10*3/uL (ref 150–400)
RBC: 3.46 MIL/uL — ABNORMAL LOW (ref 4.22–5.81)
RDW: 12.7 % (ref 11.5–15.5)
WBC: 3.9 10*3/uL — ABNORMAL LOW (ref 4.0–10.5)
nRBC: 0 % (ref 0.0–0.2)

## 2024-03-30 LAB — MAGNESIUM: Magnesium: 2 mg/dL (ref 1.7–2.4)

## 2024-03-30 LAB — GLUCOSE, CAPILLARY: Glucose-Capillary: 101 mg/dL — ABNORMAL HIGH (ref 70–99)

## 2024-03-30 NOTE — Progress Notes (Addendum)
 [facility] Progress Note   Subjective: F'gram yesterday. AVF is patent, with prominent branch vessels. HD 03/29/2024.  Objective Vitals:   03/29/24 2116 03/29/24 2140 03/30/24 0522 03/30/24 0744  BP: (!) 189/96 (!) 152/94 (!) 105/56 119/72  Pulse: 79 77 71 76  Resp: 18 20 20 18   Temp:  98.4 F (36.9 C) 98.6 F (37 C) 98.5 F (36.9 C)  TempSrc:  Oral Oral Oral  SpO2: 100%  100% 100%  Weight:      Height:       Physical Exam General: Well appearing male in NAD Heart: RRR  Lungs:CTAB A/P Abdomen:NABS NT Extremities:No LE edema Dialysis Access: L AVF + T/B  Additional Objective Labs: Basic Metabolic Panel: Recent Labs  Lab 03/27/24 1959 03/28/24 2022 03/29/24 0611 03/30/24 0547  NA 139  --  137 136  K 4.6  --  5.3* 4.4  CL 96*  --  96* 97*  CO2 24  --  21* 23  GLUCOSE 163*  --  126* 103*  BUN 93*  --  108* 68*  CREATININE 14.75* 16.21* 16.26* 11.86*  CALCIUM  7.7*  --  7.4* 7.5*  PHOS  --   --  7.7*  --    Liver Function Tests: Recent Labs  Lab 03/27/24 1959 03/29/24 0611 03/30/24 0547  AST 7*  --  6*  ALT 12  --  9  ALKPHOS 88  --  58  BILITOT 0.9  --  1.0  PROT 8.0  --  6.8  ALBUMIN 4.0 3.8 3.3*   No results for input(s): "LIPASE", "AMYLASE" in the last 168 hours. CBC: Recent Labs  Lab 03/27/24 1959 03/28/24 2022 03/30/24 0547  WBC 5.7 4.8 3.9*  HGB 11.6* 13.0 10.9*  HCT 35.2* 38.0* 31.8*  MCV 95.9 92.5 91.9  PLT 225 223 198   Blood Culture    Component Value Date/Time   SDES  04/05/2020 2119    URINE, CLEAN CATCH Performed at [facility], [address], [place], [place] [address]    SPECREQUEST  04/05/2020 2119    NONE Performed at [facility], [address], [place], [place] [address]    CULT MULTIPLE SPECIES PRESENT, SUGGEST RECOLLECTION (A) 04/05/2020 2119   REPTSTATUS 04/06/2020 FINAL 04/05/2020 2119    Cardiac Enzymes: No results for input(s): "CKTOTAL", "CKMB",  "CKMBINDEX", "TROPONINI" in the last 168 hours. CBG: Recent Labs  Lab 03/29/24 0842 03/29/24 1137 03/29/24 1618 03/29/24 2155 03/30/24 0742  GLUCAP 120* 104* 82 72 101*   Iron Studies: No results for input(s): "IRON", "TIBC", "TRANSFERRIN", "FERRITIN" in the last 72 hours. @lablastinr3 @ Studies/Results: IR DIALY SHUNT INTRO NEEDLE/INTRACATH INITIAL W/IMG LEFT Result Date: 03/29/2024 INDICATION: 63 year old with end-stage renal disease. Difficulty cannulating the left upper extremity AV fistula. EXAM: LEFT UPPER EXTREMITY DIALYSIS FISTULOGRAM MEDICATIONS: None. ANESTHESIA/SEDATION: None FLUOROSCOPY: Radiation Exposure Index (as provided by the fluoroscopic device): 45 mGy Kerma CONTRAST:  60 mL Omnipaque  300 COMPLICATIONS: None immediate. PROCEDURE: Informed written consent was obtained from the patient after a thorough discussion of the procedural risks, benefits and alternatives. All questions were addressed. A timeout was performed prior to the initiation of the procedure. Ultrasound confirmed a patent left upper extremity AV fistula. Left upper arm AV fistula was cannulated using an Angiocath. Fistulogram images were obtained. The Angiocath was removed with manual compression at the end of the procedure. Bandage placed over the puncture site. FINDINGS: Ultrasound images demonstrated a patent AV fistula in the left upper arm.  Arterial anastomosis appeared to be widely patent on ultrasound. Fistulogram images demonstrated a patent left cephalic vein. Two large branch vessels coming off the cephalic vein draining into the brachial venous system. Cephalic arch is patent without significant stenosis. Central veins are patent. It was difficult to reflux contrast to the arterial anastomosis and likely related to brisk flow at the anastomosis based on the ultrasound findings. IMPRESSION: 1. Left upper extremity AV fistula is patent. No significant stenosis. 2. Two large branch vessels coming off the left  cephalic vein could be causing competitive flow. ACCESS: This access remains amenable to future percutaneous interventions as clinically indicated. Electronically Signed   By: [name] M.D.   On: 03/29/2024 17:13   Medications:   amLODipine   10 mg Oral Daily   heparin   5,000 Units Subcutaneous Q8H   insulin  aspart  0-6 Units Subcutaneous TID WC   sertraline   100 mg Oral Daily   sevelamer  carbonate  1,600 mg Oral TID WC     Dialysis Orders:  TTS - [facility] [address]-Will call for orders   Assessment/Plan:  Dysfunctional AVF/cannulation issues: Outpatient issues and unable to cannulate here today. IR consulted for fistulogram. F'gram 03/30/2024. AVF is patent with prominent side branches. No issues with HD yesterday. Use 16 g needles BFR 350. Will call OP Clinic and update.   Mild hyper kalemia- Will give lokelma  now. Follow labs  ESRD:  Usual TTS schedule - last HD was Saturday 4/19. K and volume ok, BUN rising. Will order HD for after intervention in I>   Hypertension/volume: BP improved at present. Not overtly volume overloaded., no acute dyspnea, but hasn't dialyzed in 5 days.  Anemia: Hgb > 11, ok for now  Metabolic bone disease: Ca low end, follow.  T2DM  Disposition: Discharge today. Called OP clinic and updated on labs, to use 16g needles and 350 BFR.   Bellanie Matthew H. Caoimhe Damron NP-C 03/30/2024, 9:53 AM  [employer] [phone]

## 2024-03-30 NOTE — Plan of Care (Signed)
  Problem: Education: Goal: Ability to describe self-care measures that may prevent or decrease complications (Diabetes Survival Skills Education) will improve Outcome: Progressing Goal: Individualized Educational Video(s) Outcome: Not Met (add Reason)

## 2024-03-30 NOTE — Discharge Summary (Signed)
 Physician Discharge Summary  Brandon [name]:[id number] DOB: 03-13-1961 DOA: 03/27/2024  PCP: Laneta Pintos, MD  Admit date: 03/27/2024 Discharge date: 03/30/2024  Admitted From: home Disposition:  home  Recommendations for Outpatient Follow-up:  Follow up with PCP in 1-2 weeks Follow up with HD outpatient as scheduled  Home Health: none Equipment/Devices: none  Discharge Condition: stable CODE STATUS: Full code Diet Orders (From admission, onward)     Start     Ordered   03/30/24 0202  Diet renal/carb modified with fluid restriction Diet-HS Snack? Nothing; Fluid restriction: 1200 mL Fluid; Room service appropriate? Yes; Fluid consistency: Thin  Diet effective now       Question Answer Comment  Diet-HS Snack? Nothing   Fluid restriction: 1200 mL Fluid   Room service appropriate? Yes   Fluid consistency: Thin      03/30/24 0201           Brief Narrative / Interim history: Brandon [name] is a 63 y.o. male with medical history significant of HTN, T2DM, and ESRD p/w poorly functioning LUE brachiocephalic fistula.  His last full HD session was 4/19, and his next 2 ones were unsuccessful.  He eventually came to the ED.  IR consulted to evaluate fistula  Hospital Course / Discharge diagnoses: Principal Problem:   ESRD (end stage renal disease) on dialysis [facility]) Active Problems:   Hypertension associated with diabetes (HCC)   Depression, recurrent (HCC)   AV fistula occlusion, initial encounter [facility])   Principal problem Malfunctioning AV fistula -patient was admitted to the hospital, IR as well as nephrology consulted.  He underwent a left upper extremity fistulogram, which showed a patent left arm AV fistula with prominent branch vessels without significant stenosis.  Following that, patient was dialyzed in-house without any issues.  It is unclear as to why his AV fistula has been malfunctioning as an outpatient.  Case was discussed with nephrology, and he will be  discharged home in stable condition.    Active problems ESRD-he was dialyzed here prior to discharge Hyperkalemia-due to missed dialysis, received Lokelma  and HD Essential hypertension-continue amlodipine  History of depression-continue Zoloft   Sepsis ruled out   Discharge Instructions   Allergies as of 03/30/2024       Reactions   Apresoline  [hydralazine ] Other (See Comments)   Chest pain if by mouth- can tolerate it via IV   Imdur  [isosorbide  Nitrate] Other (See Comments)   Headaches   Ms Contin  [morphine ] Itching        Medication List     TAKE these medications    accu-chek soft touch lancets Use as instructed   amLODipine  10 MG tablet Commonly known as: NORVASC  TAKE 2 TABLETS BY MOUTH EVERY DAY What changed:  how much to take when to take this reasons to take this   naproxen sodium 220 MG tablet Commonly known as: ALEVE Take 440 mg by mouth 2 (two) times daily as needed (pain).   NovoLOG  FlexPen 100 UNIT/ML FlexPen Generic drug: insulin  aspart Inject 0-25 Units into the skin 3 (three) times daily as needed (blood sugar > 200).   OneTouch Ultra test strip Generic drug: glucose blood Blood sugar testing done TID for insulin  dependant Type @ diabetes - E11.65   sertraline  100 MG tablet Commonly known as: ZOLOFT  TAKE 1 TABLET BY MOUTH EVERY DAY   sevelamer  carbonate 800 MG tablet Commonly known as: RENVELA  Take 1,600 mg by mouth 3 (three) times daily with meals.  Consultations: Nephrology IR  Procedures/Studies:  IR DIALY SHUNT INTRO NEEDLE/INTRACATH INITIAL W/IMG LEFT Result Date: 03/29/2024 INDICATION: 63 year old with end-stage renal disease. Difficulty cannulating the left upper extremity AV fistula. EXAM: LEFT UPPER EXTREMITY DIALYSIS FISTULOGRAM MEDICATIONS: None. ANESTHESIA/SEDATION: None FLUOROSCOPY: Radiation Exposure Index (as provided by the fluoroscopic device): 45 mGy Kerma CONTRAST:  60 mL Omnipaque  300 COMPLICATIONS: None  immediate. PROCEDURE: Informed written consent was obtained from the patient after a thorough discussion of the procedural risks, benefits and alternatives. All questions were addressed. A timeout was performed prior to the initiation of the procedure. Ultrasound confirmed a patent left upper extremity AV fistula. Left upper arm AV fistula was cannulated using an Angiocath. Fistulogram images were obtained. The Angiocath was removed with manual compression at the end of the procedure. Bandage placed over the puncture site. FINDINGS: Ultrasound images demonstrated a patent AV fistula in the left upper arm. Arterial anastomosis appeared to be widely patent on ultrasound. Fistulogram images demonstrated a patent left cephalic vein. Two large branch vessels coming off the cephalic vein draining into the brachial venous system. Cephalic arch is patent without significant stenosis. Central veins are patent. It was difficult to reflux contrast to the arterial anastomosis and likely related to brisk flow at the anastomosis based on the ultrasound findings. IMPRESSION: 1. Left upper extremity AV fistula is patent. No significant stenosis. 2. Two large branch vessels coming off the left cephalic vein could be causing competitive flow. ACCESS: This access remains amenable to future percutaneous interventions as clinically indicated. Electronically Signed   By: [name] M.D.   On: 03/29/2024 17:13   DG Chest Portable 1 View Result Date: 02/29/2024 CLINICAL DATA:  History of ESRD.  Missed recent dialysis. EXAM: PORTABLE CHEST 1 VIEW COMPARISON:  Chest radiograph dated 02/19/2024. FINDINGS: The heart size is upper limits of normal. Mediastinal contours are within normal limits. Central pulmonary vascular congestion with mildly increased interstitial prominence. Mild left basilar atelectasis. No focal consolidation, sizeable pleural effusion, or pneumothorax. No acute osseous abnormality. IMPRESSION: Central pulmonary  vascular congestion with possible mild interstitial edema, new since the prior exam. Electronically Signed   By: Mannie Seek M.D.   On: 02/29/2024 16:43     Subjective: - no chest pain, shortness of breath, no abdominal pain, nausea or vomiting.   Discharge Exam: BP 119/72 (BP Location: Left Arm)   Pulse 76   Temp 98.5 F (36.9 C) (Oral)   Resp 18   Ht 6' (1.829 m)   Wt 100.5 kg   SpO2 100%   BMI 30.05 kg/m   General: Pt is alert, awake, not in acute distress Cardiovascular: RRR, S1/S2 +, no rubs, no gallops Respiratory: CTA bilaterally, no wheezing, no rhonchi Abdominal: Soft, NT, ND, bowel sounds + Extremities: no edema, no cyanosis   The results of significant diagnostics from this hospitalization (including imaging, microbiology, ancillary and laboratory) are listed below for reference.     Microbiology: No results found for this or any previous visit (from the past 240 hours).   Labs: Basic Metabolic Panel: Recent Labs  Lab 03/27/24 1959 03/28/24 2022 03/29/24 0611 03/30/24 0547  NA 139  --  137 136  K 4.6  --  5.3* 4.4  CL 96*  --  96* 97*  CO2 24  --  21* 23  GLUCOSE 163*  --  126* 103*  BUN 93*  --  108* 68*  CREATININE 14.75* 16.21* 16.26* 11.86*  CALCIUM  7.7*  --  7.4* 7.5*  MG  --   --   --  2.0  PHOS  --   --  7.7*  --    Liver Function Tests: Recent Labs  Lab 03/27/24 1959 03/29/24 0611 03/30/24 0547  AST 7*  --  6*  ALT 12  --  9  ALKPHOS 88  --  58  BILITOT 0.9  --  1.0  PROT 8.0  --  6.8  ALBUMIN 4.0 3.8 3.3*   CBC: Recent Labs  Lab 03/27/24 1959 03/28/24 2022 03/30/24 0547  WBC 5.7 4.8 3.9*  HGB 11.6* 13.0 10.9*  HCT 35.2* 38.0* 31.8*  MCV 95.9 92.5 91.9  PLT 225 223 198   CBG: Recent Labs  Lab 03/29/24 0842 03/29/24 1137 03/29/24 1618 03/29/24 2155 03/30/24 0742  GLUCAP 120* 104* 82 72 101*   Hgb A1c Recent Labs    03/28/24 2022  HGBA1C 7.0*   Lipid Profile No results for input(s): "CHOL", "HDL",  "LDLCALC", "TRIG", "CHOLHDL", "LDLDIRECT" in the last 72 hours. Thyroid  function studies No results for input(s): "TSH", "T4TOTAL", "T3FREE", "THYROIDAB" in the last 72 hours.  Invalid input(s): "FREET3" Urinalysis    Component Value Date/Time   COLORURINE YELLOW 04/20/2022 1837   APPEARANCEUR HAZY (A) 04/20/2022 1837   LABSPEC 1.012 04/20/2022 1837   LABSPEC 1.030 05/23/2017 1538   PHURINE 5.0 04/20/2022 1837   GLUCOSEU NEGATIVE 04/20/2022 1837   HGBUR NEGATIVE 04/20/2022 1837   BILIRUBINUR NEGATIVE 04/20/2022 1837   BILIRUBINUR negative 09/18/2020 1437   BILIRUBINUR neg 11/04/2016 1619   KETONESUR NEGATIVE 04/20/2022 1837   PROTEINUR 100 (A) 04/20/2022 1837   UROBILINOGEN 0.2 09/18/2020 1437   UROBILINOGEN 1.0 12/13/2014 1029   NITRITE NEGATIVE 04/20/2022 1837   LEUKOCYTESUR TRACE (A) 04/20/2022 1837    FURTHER DISCHARGE INSTRUCTIONS:   Get Medicines reviewed and adjusted: Please take all your medications with you for your next visit with your Primary MD   Laboratory/radiological data: Please request your Primary MD to go over all hospital tests and procedure/radiological results at the follow up, please ask your Primary MD to get all Hospital records sent to his/her office.   In some cases, they will be blood work, cultures and biopsy results pending at the time of your discharge. Please request that your primary care M.D. goes through all the records of your hospital data and follows up on these results.   Also Note the following: If you experience worsening of your admission symptoms, develop shortness of breath, life threatening emergency, suicidal or homicidal thoughts you must seek medical attention immediately by calling 911 or calling your MD immediately  if symptoms less severe.   You must read complete instructions/literature along with all the possible adverse reactions/side effects for all the Medicines you take and that have been prescribed to you. Take any new  Medicines after you have completely understood and accpet all the possible adverse reactions/side effects.    Do not drive when taking Pain medications or sleeping medications (Benzodaizepines)   Do not take more than prescribed Pain, Sleep and Anxiety Medications. It is not advisable to combine anxiety,sleep and pain medications without talking with your primary care practitioner   Special Instructions: If you have smoked or chewed Tobacco  in the last 2 yrs please stop smoking, stop any regular Alcohol  and or any Recreational drug use.   Wear Seat belts while driving.   Please note: You were cared for by a hospitalist during your hospital stay. Once you are discharged, your primary care physician will handle any further medical issues. Please note  that NO REFILLS for any discharge medications will be authorized once you are discharged, as it is imperative that you return to your primary care physician (or establish a relationship with a primary care physician if you do not have one) for your post hospital discharge needs so that they can reassess your need for medications and monitor your lab values.  Time coordinating discharge: 40 minutes  SIGNED:  Kathlen Para, MD, PhD 03/30/2024, 9:58 AM

## 2024-04-01 ENCOUNTER — Telehealth: Payer: Self-pay

## 2024-04-01 NOTE — Transitions of Care (Post Inpatient/ED Visit) (Signed)
   04/01/2024  Name: RECARDO VIERECK MRN: [id number] DOB: 17-Jul-1961  Today's TOC FU Call Status: Today's TOC FU Call Status:: Unsuccessful Call (1st Attempt) Unsuccessful Call (1st Attempt) Date: 04/01/24  Attempted to reach the patient regarding the most recent Inpatient/ED visit.  Follow Up Plan: Additional outreach attempts will be made to reach the patient to complete the Transitions of Care (Post Inpatient/ED visit) call.   Orpha Blade, RN, BSN, CEN [employer]- Transition of Care Team.  [facility] [phone]

## 2024-04-01 NOTE — Progress Notes (Signed)
 Late Note entry- April 01, 2024  Pt was d/c on Saturday. Contacted [facility] this morning and spoke to [place], Charity fundraiser. Clinic advised of pt's d/c date and that pt should resume care tomorrow. D/C summary, last renal note, and procedure note faxed to clinic for continuation of care.   Lauraine Polite Renal Navigator [phone]

## 2024-04-02 ENCOUNTER — Ambulatory Visit

## 2024-04-02 ENCOUNTER — Ambulatory Visit (HOSPITAL_COMMUNITY)

## 2024-04-02 ENCOUNTER — Telehealth: Payer: Self-pay

## 2024-04-02 NOTE — Transitions of Care (Post Inpatient/ED Visit) (Signed)
   04/02/2024  Name: Brandon [name] MRN: [id number] DOB: 1961/09/11  Today's TOC FU Call Status: Today's TOC FU Call Status:: Unsuccessful Call (2nd Attempt) Unsuccessful Call (2nd Attempt) Date: 04/02/24  Attempted to reach the patient regarding the most recent Inpatient/ED visit.  Follow Up Plan: Additional outreach attempts will be made to reach the patient to complete the Transitions of Care (Post Inpatient/ED visit) call.   Orpha Blade, RN, BSN, CEN [employer]- Transition of Care Team.  [facility] [phone]

## 2024-04-03 ENCOUNTER — Telehealth: Payer: Self-pay

## 2024-04-03 NOTE — Transitions of Care (Post Inpatient/ED Visit) (Signed)
 04/03/2024  Name: Brandon [name] MRN: [id number] DOB: 01-23-1961  Today's TOC FU Call Status: Today's TOC FU Call Status:: Successful TOC FU Call Completed TOC FU Call Complete Date: 04/03/24 Patient's Name and Date of Birth confirmed.  Transition Care Management Follow-up Telephone Call How have you been since you were released from the hospital?: Same (no appetite and weakness) Any questions or concerns?: No  Items Reviewed: Did you receive and understand the discharge instructions provided?: Yes Medications obtained,verified, and reconciled?: Yes (Medications Reviewed) Any new allergies since your discharge?: No Dietary orders reviewed?: NA Do you have support at home?: Yes People in Home [RPT]: friend(s) Name of Support/Comfort Primary Source: Ms. Barbie Lex  Medications Reviewed Today: Medications Reviewed Today     Reviewed by Vanetta Generous, RN (Registered Nurse) on 04/03/24 at 1345  Med List Status: <None>   Medication Order Taking? Sig Documenting Provider Last Dose Status Informant  amLODipine  (NORVASC ) 10 MG tablet [id number] No TAKE 2 TABLETS BY MOUTH EVERY DAY  Patient not taking: Reported on 04/03/2024   Sharyon Deis, NP Not Taking Active Self, Pharmacy Records           Med Note (COFFELL, ANGELA M   Thu Mar 28, 2024 12:05 PM) Pt states he stopped taking this medication as directed and started using 10mg  QD PRN as his blood pressure had been running low. He states he checks his blood pressure every morning.  glucose blood (ONETOUCH ULTRA) test strip [id number] Yes Blood sugar testing done TID for insulin  dependant Type @ diabetes - E11.65 Sharyon Deis, NP Taking Active Self, Pharmacy Records  insulin  aspart (NOVOLOG  FLEXPEN) 100 UNIT/ML FlexPen [id number] Yes Inject 0-25 Units into the skin 3 (three) times daily as needed (blood sugar > 200). [provider] Taking Active Self, Pharmacy Records           Med Note (COFFELL, ANGELA M   Thu Mar 28, 2024  12:08 PM) No fill history found on dispense report or Dr. Anson Basta - pt specified he is using Novolog  u-100 pens. Pt does not use an exact sliding scale but provided his range. Pt states he injects what he feels appropriate as long as his sugar is over 200, he does not inject over 25u per dose.  Lancets (ACCU-CHEK SOFT TOUCH) lancets [id number] Yes Use as instructed  Patient taking differently: 1 each by Other route as directed.   Tysinger, Christiane Cowing, PA-C Taking Active Self, Pharmacy Records  naproxen sodium (ALEVE) 220 MG tablet [id number] Yes Take 440 mg by mouth 2 (two) times daily as needed (pain). [provider] Taking Active Self, Pharmacy Records  sertraline  (ZOLOFT ) 100 MG tablet [id number] Yes TAKE 1 TABLET BY MOUTH EVERY DAY Laneta Pintos, MD Taking Active Self, Pharmacy Records           Med Note (COFFELL, [facility] M   Thu Mar 28, 2024 12:09 PM) Pt states he takes this medication daily. Per dispense report, LF 10/03/2023 #90, 90 DS.  sevelamer  carbonate (RENVELA ) 800 MG tablet [id number] No Take 1,600 mg by mouth 3 (three) times daily with meals.  Patient not taking: Reported on 04/03/2024   [provider] Not Taking Active Self, Pharmacy Records           Med Note (COFFELL, ANGELA M   Thu Mar 28, 2024 12:10 PM) Pt states he has not taken this medication in over a week because he does not feel any different while taking so he thinks  it is not helping his condition.            Home Care and Equipment/Supplies: Were Home Health Services Ordered?: NA Any new equipment or medical supplies ordered?: NA  Functional Questionnaire: Do you need assistance with bathing/showering or dressing?: No Do you need assistance with meal preparation?: Yes (Ms. Barbie Lex) Do you need assistance with eating?: No Do you have difficulty maintaining continence: No Do you need assistance with getting out of bed/getting out of a chair/moving?: No (sleeps on the sofa at friends house) Do you have  difficulty managing or taking your medications?: No  Follow up appointments reviewed: PCP Follow-up appointment confirmed?: No MD Provider Line [phone] Given: No Specialist Hospital Follow-up appointment confirmed?: No (see kidney doctor at dialysis) Reason Specialist Follow-Up Not Confirmed: Patient has Specialist Provider Number and will Call for Appointment Do you need transportation to your follow-up appointment?: No Do you understand care options if your condition(s) worsen?: Yes-patient verbalized understanding  SDOH Interventions Today    Flowsheet Row Most Recent Value  SDOH Interventions   Food Insecurity Interventions Intervention Not Indicated  Housing Interventions Intervention Not Indicated  Transportation Interventions Intervention Not Indicated  Utilities Interventions Intervention Not Indicated     Reports feeling weak.  Patient reports difficulty with dialysis. Reports that he did not go to dialysis yesterday due to arm pain. Reports that he has not been to outpatient dialysis since hospital discharge. Reviewed with patient the importance of attending dialysis. Encouraged patient to call PCP for a hospital follow up appointment.   Patient reports that he has good support from a friend Ms. Bryd.  Patient denies any needs or concerns today. Reviewed and offered 30 day TOC program and patient declines.   Orpha Blade, RN, BSN, CEN [employer]- Transition of Care Team.  [facility] [phone]

## 2024-04-04 ENCOUNTER — Observation Stay

## 2024-04-04 ENCOUNTER — Other Ambulatory Visit: Payer: Self-pay

## 2024-04-04 ENCOUNTER — Encounter: Payer: Self-pay | Admitting: Internal Medicine

## 2024-04-04 ENCOUNTER — Inpatient Hospital Stay
Admission: EM | Admit: 2024-04-04 | Discharge: 2024-04-08 | DRG: 314 | Disposition: A | Discharge status: Home / Self Care | Attending: Family Medicine | Admitting: Family Medicine

## 2024-04-04 ENCOUNTER — Encounter
Admission: EM | Disposition: A | Discharge status: Home / Self Care | Payer: Self-pay | Source: Home / Self Care | Attending: Family Medicine

## 2024-04-04 DIAGNOSIS — Z885 Allergy status to narcotic agent status: Secondary | ICD-10-CM

## 2024-04-04 DIAGNOSIS — F919 Conduct disorder, unspecified: Secondary | ICD-10-CM | POA: Diagnosis present

## 2024-04-04 DIAGNOSIS — T82898A Other specified complication of vascular prosthetic devices, implants and grafts, initial encounter: Secondary | ICD-10-CM | POA: Diagnosis not present

## 2024-04-04 DIAGNOSIS — E875 Hyperkalemia: Principal | ICD-10-CM

## 2024-04-04 DIAGNOSIS — Z8042 Family history of malignant neoplasm of prostate: Secondary | ICD-10-CM | POA: Diagnosis not present

## 2024-04-04 DIAGNOSIS — Z888 Allergy status to other drugs, medicaments and biological substances status: Secondary | ICD-10-CM

## 2024-04-04 DIAGNOSIS — Z8249 Family history of ischemic heart disease and other diseases of the circulatory system: Secondary | ICD-10-CM | POA: Diagnosis not present

## 2024-04-04 DIAGNOSIS — Z79899 Other long term (current) drug therapy: Secondary | ICD-10-CM | POA: Diagnosis not present

## 2024-04-04 DIAGNOSIS — Z992 Dependence on renal dialysis: Secondary | ICD-10-CM | POA: Diagnosis not present

## 2024-04-04 DIAGNOSIS — T82510A Breakdown (mechanical) of surgically created arteriovenous fistula, initial encounter: Principal | ICD-10-CM | POA: Diagnosis present

## 2024-04-04 DIAGNOSIS — Z794 Long term (current) use of insulin: Secondary | ICD-10-CM

## 2024-04-04 DIAGNOSIS — J449 Chronic obstructive pulmonary disease, unspecified: Secondary | ICD-10-CM | POA: Diagnosis present

## 2024-04-04 DIAGNOSIS — N186 End stage renal disease: Secondary | ICD-10-CM | POA: Diagnosis not present

## 2024-04-04 DIAGNOSIS — I120 Hypertensive chronic kidney disease with stage 5 chronic kidney disease or end stage renal disease: Secondary | ICD-10-CM | POA: Diagnosis not present

## 2024-04-04 DIAGNOSIS — Z800 Family history of malignant neoplasm of digestive organs: Secondary | ICD-10-CM

## 2024-04-04 DIAGNOSIS — F419 Anxiety disorder, unspecified: Secondary | ICD-10-CM | POA: Diagnosis present

## 2024-04-04 DIAGNOSIS — F840 Autistic disorder: Secondary | ICD-10-CM | POA: Diagnosis present

## 2024-04-04 DIAGNOSIS — I5033 Acute on chronic diastolic (congestive) heart failure: Secondary | ICD-10-CM | POA: Diagnosis not present

## 2024-04-04 DIAGNOSIS — E1122 Type 2 diabetes mellitus with diabetic chronic kidney disease: Secondary | ICD-10-CM | POA: Diagnosis not present

## 2024-04-04 DIAGNOSIS — Z833 Family history of diabetes mellitus: Secondary | ICD-10-CM

## 2024-04-04 DIAGNOSIS — I152 Hypertension secondary to endocrine disorders: Secondary | ICD-10-CM

## 2024-04-04 DIAGNOSIS — I2510 Atherosclerotic heart disease of native coronary artery without angina pectoris: Secondary | ICD-10-CM | POA: Diagnosis present

## 2024-04-04 DIAGNOSIS — R9431 Abnormal electrocardiogram [ECG] [EKG]: Secondary | ICD-10-CM | POA: Diagnosis not present

## 2024-04-04 DIAGNOSIS — Z85528 Personal history of other malignant neoplasm of kidney: Secondary | ICD-10-CM

## 2024-04-04 DIAGNOSIS — E785 Hyperlipidemia, unspecified: Secondary | ICD-10-CM | POA: Diagnosis present

## 2024-04-04 DIAGNOSIS — Z803 Family history of malignant neoplasm of breast: Secondary | ICD-10-CM | POA: Diagnosis not present

## 2024-04-04 DIAGNOSIS — I509 Heart failure, unspecified: Secondary | ICD-10-CM | POA: Diagnosis not present

## 2024-04-04 DIAGNOSIS — Z8673 Personal history of transient ischemic attack (TIA), and cerebral infarction without residual deficits: Secondary | ICD-10-CM | POA: Diagnosis not present

## 2024-04-04 DIAGNOSIS — D631 Anemia in chronic kidney disease: Secondary | ICD-10-CM | POA: Diagnosis not present

## 2024-04-04 DIAGNOSIS — I517 Cardiomegaly: Secondary | ICD-10-CM | POA: Diagnosis not present

## 2024-04-04 DIAGNOSIS — Y832 Surgical operation with anastomosis, bypass or graft as the cause of abnormal reaction of the patient, or of later complication, without mention of misadventure at the time of the procedure: Secondary | ICD-10-CM | POA: Diagnosis present

## 2024-04-04 HISTORY — PX: TEMPORARY DIALYSIS CATHETER: CATH118312

## 2024-04-04 LAB — CBC WITH DIFFERENTIAL/PLATELET
Abs Immature Granulocytes: 0.01 10*3/uL (ref 0.00–0.07)
Basophils Absolute: 0 10*3/uL (ref 0.0–0.1)
Basophils Relative: 1 %
Eosinophils Absolute: 0.1 10*3/uL (ref 0.0–0.5)
Eosinophils Relative: 4 %
HCT: 33.2 % — ABNORMAL LOW (ref 39.0–52.0)
Hemoglobin: 11.2 g/dL — ABNORMAL LOW (ref 13.0–17.0)
Immature Granulocytes: 0 %
Lymphocytes Relative: 39 %
Lymphs Abs: 1.6 10*3/uL (ref 0.7–4.0)
MCH: 32.1 pg (ref 26.0–34.0)
MCHC: 33.7 g/dL (ref 30.0–36.0)
MCV: 95.1 fL (ref 80.0–100.0)
Monocytes Absolute: 0.3 10*3/uL (ref 0.1–1.0)
Monocytes Relative: 7 %
Neutro Abs: 2 10*3/uL (ref 1.7–7.7)
Neutrophils Relative %: 49 %
Platelets: 214 10*3/uL (ref 150–400)
RBC: 3.49 MIL/uL — ABNORMAL LOW (ref 4.22–5.81)
RDW: 12.6 % (ref 11.5–15.5)
WBC: 4.1 10*3/uL (ref 4.0–10.5)
nRBC: 0 % (ref 0.0–0.2)

## 2024-04-04 LAB — GLUCOSE, CAPILLARY
Glucose-Capillary: 104 mg/dL — ABNORMAL HIGH (ref 70–99)
Glucose-Capillary: 113 mg/dL — ABNORMAL HIGH (ref 70–99)
Glucose-Capillary: 157 mg/dL — ABNORMAL HIGH (ref 70–99)

## 2024-04-04 LAB — BASIC METABOLIC PANEL WITH GFR
Anion gap: 18 — ABNORMAL HIGH (ref 5–15)
BUN: 124 mg/dL — ABNORMAL HIGH (ref 6–20)
CO2: 23 mmol/L (ref 22–32)
Calcium: 7 mg/dL — ABNORMAL LOW (ref 8.9–10.3)
Chloride: 99 mmol/L (ref 98–111)
Creatinine, Ser: 19.53 mg/dL — ABNORMAL HIGH (ref 0.61–1.24)
GFR, Estimated: 2 mL/min — ABNORMAL LOW (ref >60)
Glucose, Bld: 127 mg/dL — ABNORMAL HIGH (ref 70–99)
Potassium: 6.1 mmol/L — ABNORMAL HIGH (ref 3.5–5.1)
Sodium: 140 mmol/L (ref 135–145)

## 2024-04-04 LAB — POTASSIUM: Potassium: 6.4 mmol/L (ref 3.5–5.1)

## 2024-04-04 SURGERY — TEMPORARY DIALYSIS CATHETER
Anesthesia: Moderate Sedation

## 2024-04-04 MED ORDER — SERTRALINE HCL 50 MG PO TABS
100.0000 mg | ORAL_TABLET | Freq: Every day | ORAL | Status: DC
  Administered 2024-04-05 – 2024-04-07 (×3): 100 mg via ORAL
  Filled 2024-04-04 (×3): qty 2

## 2024-04-04 MED ORDER — HEPARIN SODIUM (PORCINE) 5000 UNIT/ML IJ SOLN
5000.0000 [IU] | Freq: Two times a day (BID) | INTRAMUSCULAR | Status: DC
  Administered 2024-04-04 – 2024-04-07 (×6): 5000 [IU] via SUBCUTANEOUS
  Filled 2024-04-04 (×6): qty 1

## 2024-04-04 MED ORDER — CALCIUM GLUCONATE-NACL 1-0.675 GM/50ML-% IV SOLN
1.0000 g | Freq: Once | INTRAVENOUS | Status: AC
  Administered 2024-04-04: 1000 mg via INTRAVENOUS
  Filled 2024-04-04: qty 50

## 2024-04-04 MED ORDER — ACETAMINOPHEN 325 MG PO TABS: 650.0000 mg | ORAL_TABLET | ORAL | Status: DC | PRN

## 2024-04-04 MED ORDER — DEXTROSE 50 % IV SOLN
1.0000 | Freq: Once | INTRAVENOUS | Status: DC
  Filled 2024-04-04: qty 50

## 2024-04-04 MED ORDER — INSULIN ASPART 100 UNIT/ML IJ SOLN
0.0000 [IU] | Freq: Three times a day (TID) | INTRAMUSCULAR | Status: DC
  Administered 2024-04-07: 1 [IU] via SUBCUTANEOUS
  Filled 2024-04-04: qty 1

## 2024-04-04 MED ORDER — SODIUM CHLORIDE 0.9% FLUSH
3.0000 mL | Freq: Two times a day (BID) | INTRAVENOUS | Status: DC
  Administered 2024-04-04 – 2024-04-07 (×7): 3 mL via INTRAVENOUS

## 2024-04-04 MED ORDER — IBUPROFEN 400 MG PO TABS: 400.0000 mg | ORAL_TABLET | Freq: Four times a day (QID) | ORAL | Status: DC | PRN

## 2024-04-04 MED ORDER — SODIUM ZIRCONIUM CYCLOSILICATE 10 G PO PACK
10.0000 g | PACK | Freq: Two times a day (BID) | ORAL | Status: AC
  Administered 2024-04-04 – 2024-04-05 (×2): 10 g via ORAL
  Filled 2024-04-04 (×3): qty 1

## 2024-04-04 MED ORDER — SODIUM CHLORIDE 0.9% FLUSH: 3.0000 mL | INTRAVENOUS | Status: DC | PRN

## 2024-04-04 MED ORDER — LIDOCAINE-EPINEPHRINE (PF) 1 %-1:200000 IJ SOLN
INTRAMUSCULAR | Status: DC | PRN
  Administered 2024-04-04: 10 mL

## 2024-04-04 MED ORDER — INSULIN ASPART 100 UNIT/ML IV SOLN
5.0000 [IU] | Freq: Once | INTRAVENOUS | Status: AC
  Administered 2024-04-04: 5 [IU] via INTRAVENOUS
  Filled 2024-04-04: qty 0.05

## 2024-04-04 MED ORDER — SODIUM ZIRCONIUM CYCLOSILICATE 10 G PO PACK
10.0000 g | PACK | Freq: Once | ORAL | Status: AC
  Administered 2024-04-04: 10 g via ORAL
  Filled 2024-04-04: qty 1

## 2024-04-04 MED ORDER — ONDANSETRON HCL 4 MG/2ML IJ SOLN: 4.0000 mg | Freq: Four times a day (QID) | INTRAMUSCULAR | Status: DC | PRN

## 2024-04-04 MED ORDER — SODIUM CHLORIDE 0.9 % IV SOLN: 250.0000 mL | INTRAVENOUS | Status: AC | PRN

## 2024-04-04 MED ORDER — CHLORHEXIDINE GLUCONATE CLOTH 2 % EX PADS
6.0000 | MEDICATED_PAD | Freq: Every day | CUTANEOUS | Status: DC
  Administered 2024-04-05 – 2024-04-08 (×3): 6 via TOPICAL
  Filled 2024-04-04: qty 6

## 2024-04-04 MED ORDER — SODIUM CHLORIDE 0.9 % IV SOLN: INTRAVENOUS | Status: DC

## 2024-04-04 MED ORDER — NAPROXEN 250 MG PO TABS
375.0000 mg | ORAL_TABLET | Freq: Two times a day (BID) | ORAL | Status: DC | PRN
  Administered 2024-04-05: 375 mg via ORAL
  Filled 2024-04-04 (×2): qty 2

## 2024-04-04 MED ORDER — NAPROXEN SODIUM 220 MG PO TABS: 440.0000 mg | ORAL_TABLET | Freq: Two times a day (BID) | ORAL | Status: DC | PRN

## 2024-04-04 MED ORDER — DEXTROSE 50 % IV SOLN
1.0000 | Freq: Once | INTRAVENOUS | Status: AC
Start: 2024-04-05 — End: 2024-04-04
  Administered 2024-04-04: 50 mL via INTRAVENOUS
  Filled 2024-04-04: qty 50

## 2024-04-04 MED ORDER — SEVELAMER CARBONATE 800 MG PO TABS
1600.0000 mg | ORAL_TABLET | Freq: Three times a day (TID) | ORAL | Status: DC
  Administered 2024-04-05 – 2024-04-06 (×3): 1600 mg via ORAL
  Filled 2024-04-04 (×6): qty 2

## 2024-04-04 MED ORDER — INSULIN ASPART 100 UNIT/ML IV SOLN
5.0000 [IU] | Freq: Once | INTRAVENOUS | Status: DC
  Filled 2024-04-04: qty 0.05

## 2024-04-04 MED ORDER — NAPROXEN 375 MG PO TABS: 375.0000 mg | ORAL_TABLET | Freq: Two times a day (BID) | ORAL | Status: DC

## 2024-04-04 SURGICAL SUPPLY — 2 items
COVER PROBE ULTRASOUND 5X96 (MISCELLANEOUS) IMPLANT
KIT DIALYSIS CATH TRI 30X13 (CATHETERS) IMPLANT

## 2024-04-04 NOTE — ED Provider Notes (Signed)
 [facility] Provider Note    Event Date/Time   First MD Initiated Contact with Patient 04/04/24 [phone]     (approximate)   History   Vascular Access Problem   HPI  Brandon [name] is a 64 y.o. male with CKD on dialysis who comes in with concerns for issues with fistula. Last dialysis on Friday.  I reviewed the hospital note from 03/27/2024 where patient was admitted till forced/20 05/2024 where patient had a malfunctioning AV fistula.  He had an underwent a left upper extremity fistulogram which showed that it was patent.  He was able to have dialysis without any issues.  Patient did not go to dialysis on Tuesday secondary to just discomfort in his arm.  He tried to go to dialysis today where they attempted to access him 4-5 times but were unable to access them therefore they were discussed with Dr. Rhesa Celeste who recommended he come back to the emergency room.    Physical Exam   Triage Vital Signs: ED Triage Vitals [04/04/24 0815]  Encounter Vitals Group     BP      Systolic BP Percentile      Diastolic BP Percentile      Pulse      Resp      Temp      Temp src      SpO2      Weight      Height      Head Circumference      Peak Flow      Pain Score 3     Pain Loc      Pain Education      Exclude from Growth Chart     Most recent vital signs: Vitals:   04/04/24 0816  BP: (!) 174/102  Pulse: 81  Resp: 18  Temp: 98.4 F (36.9 C)  SpO2: 100%     General: Awake, no distress.  CV:  Good peripheral perfusion.  Resp:  Normal effort.  Abd:  No distention.  Other:  Fistula noted on the left upper arm, thrill positive.  Good radial pulse   ED Results / Procedures / Treatments   Labs (all labs ordered are listed, but only abnormal results are displayed) Labs Reviewed  CBC WITH DIFFERENTIAL/PLATELET - Abnormal; Notable for the following components:      Result Value   RBC 3.49 (*)    Hemoglobin 11.2 (*)    HCT 33.2 (*)    All other  components within normal limits  BASIC METABOLIC PANEL WITH GFR     EKG  My interpretation of EKG:  Sinus rate of 79 without any ST elevation, T waves do looked a little enlarged, normal intervals  RADIOLOGY    PROCEDURES:  Critical Care performed: Yes, see critical care procedure note(s)  .1-3 Lead EKG Interpretation  Performed by: Lubertha Rush, MD Authorized by: Lubertha Rush, MD     Interpretation: normal     ECG rate:  70   ECG rate assessment: normal     Rhythm: sinus rhythm     Ectopy: none     Conduction: normal   .Critical Care  Performed by: Lubertha Rush, MD Authorized by: Lubertha Rush, MD   Critical care provider statement:    Critical care time (minutes):  30   Critical care was necessary to treat or prevent imminent or life-threatening deterioration of the following conditions: hyperkalemia.   Critical care was time spent personally  by me on the following activities:  Development of treatment plan with patient or surrogate, discussions with consultants, evaluation of patient's response to treatment, examination of patient, ordering and review of laboratory studies, ordering and review of radiographic studies, ordering and performing treatments and interventions, pulse oximetry, re-evaluation of patient's condition and review of old charts    MEDICATIONS ORDERED IN ED: Medications  sodium zirconium cyclosilicate  (LOKELMA ) packet 10 g (has no administration in time range)  insulin  aspart (novoLOG ) injection 5 Units (has no administration in time range)    And  dextrose  50 % solution 50 mL (has no administration in time range)  calcium  gluconate 1 g/ 50 mL sodium chloride  IVPB (1,000 mg Intravenous New Bag/Given 04/04/24 0913)     IMPRESSION / MDM / ASSESSMENT AND PLAN / ED COURSE  I reviewed the triage vital signs and the nursing notes.   Patient's presentation is most consistent with acute presentation with potential threat to life or bodily function.    Differential includes hyperkalemia, acidosis,, fluid overload.  Patient is hypertensive not hypoxic.  Will get basic labs to evaluate need for emergent dialysis.  Will discuss the case with nephrology.  Given patient has a history of hyperkalemia -will give a dose of calcium  gluconate while awaiting potassium levels to come back just in case some elevated t waves   CBC shows stable hemoglobin. BMP shows elevated potassium and elevated BUN.  Discussed with Dr. Rhesa Celeste will admit to the hospital team they will attempt to do dialysis if they are unable to do dialysis they will consider catheter placement.   The patient is on the cardiac monitor to evaluate for evidence of arrhythmia and/or significant heart rate changes.      FINAL CLINICAL IMPRESSION(S) / ED DIAGNOSES   Final diagnoses:  Hyperkalemia  ESRD (end stage renal disease) on dialysis [facility])     Rx / DC Orders   ED Discharge Orders     None        Note:  This document was prepared using Dragon voice recognition software and may include unintentional dictation errors.   Lubertha Rush, MD 04/04/24 1021

## 2024-04-04 NOTE — Progress Notes (Signed)
 Dr. Vonna Guardian returned to pt. Bedside & spoke extensively with pt. Re: need for temp. Cath for dialysis. Pt. Agreeable to proceed with temp. Cath after speaking with Dr. Vonna Guardian.

## 2024-04-04 NOTE — H&P (Signed)
 History and Physical    Brandon [name] UUV:[id number] DOB: 01/10/1961 DOA: 04/04/2024  PCP: Laneta Pintos, MD (Confirm with patient/family/NH records and if not entered, this has to be entered at [facility] point of entry) Patient coming from: Home  I have personally briefly reviewed patient's old medical records in [facility]  Chief Complaint: HD access problem  HPI: Brandon [name] is a 63 y.o. male with medical history significant of ESRD on HD MWF, IDDM, HTN, recently poorly functioning L UE brachiocephalic fistula, presented with HD access problems.  Patient was hospitalized last week for malfunctioning left arm AV fistula, workup however showed a patent left arm AV fistula without significant stenosis and patient was able to complete inpatient HD and sent home.  Subsequently patient underwent a routine HD on Friday and completed 3 hours HD.  This Monday hypertension went to his outpatient HD and staff found left arm AV fistula again not functioning properly and HD canceled.  Today, patient went back to HD center and was told left arm AV fistula still not functioning and sent to ED instead.  Patient denies any shortness of breath cough chest pains.  No peripheral edema.  He denies any left arm swelling or pain.  ED Course: Afebrile, not tachycardia nonhypotensive blood work showed BUN 129 creatinine 19, K6.1, bicarb 23, EKG showed sinus rhythm and no acute T wave changes.  Patient was given hyperkalemia cocktail insulin  and dextrose  and Lokelma  and calcium  gluconate, nephrology consulted for emergency dialysis.  Review of Systems: As per HPI otherwise 14 point review of systems negative.    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   left and right; cryoablation bil   Depression    BH Adm in  [place] Depression   Diabetes mellitus    DKA prior hospitalization   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 ischemic stroke   GERD (gastroesophageal reflux disease)    Hiatal hernia    Hyperlipidemia    Hypertension    Seizures (HCC)    none since 2016, taking Keppra  - maw   Sleep apnea    Thyroid  disease    "weak thyroid " per MD   Wears glasses     Past Surgical History:  Procedure Laterality Date   AV FISTULA PLACEMENT Left 10/14/2022   Procedure: LEFT BRACHIOCAPHALIC FISTULA CREATION;  Surgeon: Young Hensen, MD;  Location: [facility] OR;  Service: Vascular;  Laterality: Left;   BREAST BIOPSY Left 03/24/2023   US  LT BREAST BX W LOC DEV 1ST LESION IMG BX SPEC US  GUIDE 03/24/2023 [facility] MAMMOGRAPHY   COLONOSCOPY     IR DIALY SHUNT INTRO NEEDLE/INTRACATH INITIAL W/IMG LEFT Left 03/29/2024   IR FLUORO GUIDE CV LINE RIGHT  10/06/2022   IR RADIOLOGIST EVAL & MGMT  04/04/2017   IR US  GUIDE VASC ACCESS RIGHT  10/06/2022   KIDNEY SURGERY     ablation of renal cell CA - 12/28, prior one was October 2012-Dr. Yamagata   TEE WITHOUT CARDIOVERSION N/A 11/19/2013   Procedure: TRANSESOPHAGEAL ECHOCARDIOGRAM (TEE);  Surgeon: Lenise Quince, MD;  Location: [facility] ENDOSCOPY;  Service: Cardiovascular;  Laterality: N/A;  reports that he has never smoked. He has never been exposed to tobacco smoke. He has never used smokeless tobacco. He reports that he does not currently use alcohol. He reports that he does not use drugs.  Allergies  Allergen Reactions   Apresoline  [Hydralazine ] Other (See Comments)    Chest pain if by mouth- can tolerate it via IV   Imdur  [Isosorbide  Nitrate] Other (See Comments)    Headaches   Ms Contin  [Morphine ] Itching    Family History  Problem Relation Age of Onset   Pneumonia Mother    Breast cancer Mother    Diabetes Father    Heart disease  Father    Breast cancer Sister    Prostate cancer Other    Stomach cancer Other    Colon cancer Neg Hx    Esophageal cancer Neg Hx    Rectal cancer Neg Hx      Prior to Admission medications   Medication Sig Start Date End Date Taking? Authorizing Provider  amLODipine  (NORVASC ) 10 MG tablet TAKE 2 TABLETS BY MOUTH EVERY DAY Patient not taking: Reported on 04/03/2024 03/27/23   Sharyon Deis, NP  glucose blood (ONETOUCH ULTRA) test strip Blood sugar testing done TID for insulin  dependant Type @ diabetes - E11.65 04/12/22   Sharyon Deis, NP  insulin  aspart (NOVOLOG  FLEXPEN) 100 UNIT/ML FlexPen Inject 0-25 Units into the skin 3 (three) times daily as needed (blood sugar > 200).    [provider]  Lancets (ACCU-CHEK SOFT TOUCH) lancets Use as instructed Patient taking differently: 1 each by Other route as directed. 06/11/19   Tysinger, Christiane Cowing, PA-C  naproxen  sodium (ALEVE ) 220 MG tablet Take 440 mg by mouth 2 (two) times daily as needed (pain).    [provider]  sertraline  (ZOLOFT ) 100 MG tablet TAKE 1 TABLET BY MOUTH EVERY DAY 12/28/23   Laneta Pintos, MD  sevelamer  carbonate (RENVELA ) 800 MG tablet Take 1,600 mg by mouth 3 (three) times daily with meals. Patient not taking: Reported on 04/03/2024 12/12/23   [provider]    Physical Exam: Vitals:   04/04/24 0815 04/04/24 0816 04/04/24 0830 04/04/24 1000  BP:  (!) 174/102 (!) 173/91 (!) 175/92  Pulse:  81 76 72  Resp:  18 14 11   Temp:  98.4 F (36.9 C)    TempSrc:  Oral    SpO2:  100% 99% 100%  Weight: 100.5 kg       Constitutional: NAD, calm, comfortable Vitals:   04/04/24 0815 04/04/24 0816 04/04/24 0830 04/04/24 1000  BP:  (!) 174/102 (!) 173/91 (!) 175/92  Pulse:  81 76 72  Resp:  18 14 11   Temp:  98.4 F (36.9 C)    TempSrc:  Oral    SpO2:  100% 99% 100%  Weight: 100.5 kg      Eyes: PERRL, lids and conjunctivae normal ENMT: Mucous membranes are moist. Posterior pharynx clear of any  exudate or lesions.Normal dentition.  Neck: normal, supple, no masses, no thyromegaly Respiratory: clear to auscultation bilaterally, no wheezing, no crackles. Normal respiratory effort. No accessory muscle use.  Cardiovascular: Regular rate and rhythm, no murmurs / rubs / gallops. No extremity edema. 2+ pedal pulses. No carotid bruits.  Left arm AV fistula has thrill and bruits Abdomen: no tenderness, no masses palpated. No hepatosplenomegaly. Bowel sounds positive.  Musculoskeletal: no clubbing / cyanosis. No joint deformity upper and lower extremities. Good ROM, no contractures. Normal muscle tone.  Skin: no rashes,  lesions, ulcers. No induration Neurologic: CN 2-12 grossly intact. Sensation intact, DTR normal. Strength 5/5 in all 4.  Psychiatric: Normal judgment and insight. Alert and oriented x 3. Normal mood.     Labs on Admission: I have personally reviewed following labs and imaging studies  CBC: Recent Labs  Lab 03/28/24 2022 03/30/24 0547 04/04/24 0847  WBC 4.8 3.9* 4.1  NEUTROABS  --   --  2.0  HGB 13.0 10.9* 11.2*  HCT 38.0* 31.8* 33.2*  MCV 92.5 91.9 95.1  PLT 223 198 214   Basic Metabolic Panel: Recent Labs  Lab 03/28/24 2022 03/29/24 0611 03/30/24 0547 04/04/24 0847  NA  --  137 136 140  K  --  5.3* 4.4 6.1*  CL  --  96* 97* 99  CO2  --  21* 23 23  GLUCOSE  --  126* 103* 127*  BUN  --  108* 68* 124*  CREATININE 16.21* 16.26* 11.86* 19.53*  CALCIUM   --  7.4* 7.5* 7.0*  MG  --   --  2.0  --   PHOS  --  7.7*  --   --    GFR: Estimated Creatinine Clearance: 4.9 mL/min (A) (by C-G formula based on SCr of 19.53 mg/dL (H)). Liver Function Tests: Recent Labs  Lab 03/29/24 0611 03/30/24 0547  AST  --  6*  ALT  --  9  ALKPHOS  --  58  BILITOT  --  1.0  PROT  --  6.8  ALBUMIN 3.8 3.3*   No results for input(s): "LIPASE", "AMYLASE" in the last 168 hours. No results for input(s): "AMMONIA" in the last 168 hours. Coagulation Profile: No results for  input(s): "INR", "PROTIME" in the last 168 hours. Cardiac Enzymes: No results for input(s): "CKTOTAL", "CKMB", "CKMBINDEX", "TROPONINI" in the last 168 hours. BNP (last 3 results) No results for input(s): "PROBNP" in the last 8760 hours. HbA1C: No results for input(s): "HGBA1C" in the last 72 hours. CBG: Recent Labs  Lab 03/29/24 0842 03/29/24 1137 03/29/24 1618 03/29/24 2155 03/30/24 0742  GLUCAP 120* 104* 82 72 101*   Lipid Profile: No results for input(s): "CHOL", "HDL", "LDLCALC", "TRIG", "CHOLHDL", "LDLDIRECT" in the last 72 hours. Thyroid  Function Tests: No results for input(s): "TSH", "T4TOTAL", "FREET4", "T3FREE", "THYROIDAB" in the last 72 hours. Anemia Panel: No results for input(s): "VITAMINB12", "FOLATE", "FERRITIN", "TIBC", "IRON", "RETICCTPCT" in the last 72 hours. Urine analysis:    Component Value Date/Time   COLORURINE YELLOW 04/20/2022 1837   APPEARANCEUR HAZY (A) 04/20/2022 1837   LABSPEC 1.012 04/20/2022 1837   LABSPEC 1.030 05/23/2017 1538   PHURINE 5.0 04/20/2022 1837   GLUCOSEU NEGATIVE 04/20/2022 1837   HGBUR NEGATIVE 04/20/2022 1837   BILIRUBINUR NEGATIVE 04/20/2022 1837   BILIRUBINUR negative 09/18/2020 1437   BILIRUBINUR neg 11/04/2016 1619   KETONESUR NEGATIVE 04/20/2022 1837   PROTEINUR 100 (A) 04/20/2022 1837   UROBILINOGEN 0.2 09/18/2020 1437   UROBILINOGEN 1.0 12/13/2014 1029   NITRITE NEGATIVE 04/20/2022 1837   LEUKOCYTESUR TRACE (A) 04/20/2022 1837    Radiological Exams on Admission: No results found.  EKG: Independently reviewed.  Sinus rhythm, no acute T wave changes  Assessment/Plan Principal Problem:   Hyperkalemia Active Problems:   Acute on chronic diastolic CHF (congestive heart failure) (HCC)   AV fistula occlusion, initial encounter (HCC)  (please populate well all problems here in Problem List. (For example, if patient is on BP meds at home and you resume or decide to hold them, it is a  problem that needs to be her.  Same for CAD, COPD, HLD and so on)  Malfunctioning AV fistula, recurrent - Unclear mechanism as patient had a similar problem last week and workup showed no significant findings on left arm AV fistula.  Nephrology will attempt emergent dialysis today.  If unable to perform HD today, nephrology plans to contact IR for a temporal HD catheter insertion.  Patient informed about the plan and agreed.  ESRD on HD Worsening of chronic uremia and azotemia -Secondary to missed HD sessions, management as above.  Hyperkalemia - Secondary to missed HD sessions, received hyperkalemia cocktail in the ER including calcium  gluconate insulin  and Lokelma .  IDDM -SSI  HTN - Stable, continue amlodipine   DVT prophylaxis: Heparin  subcu Code Status: Full code Family Communication: None at bedside Disposition Plan: Expect less than 2 midnight hospital stay Consults called: Nephrology Admission status: Telemetry observation   [place] MD [facility] Pager [phone]  04/04/2024, 12:13 PM

## 2024-04-04 NOTE — ED Triage Notes (Signed)
 Sent to ED to evaluate left AV fistula and to receive dialysis.  Last dialysis on Friday while inpatient at [facility].

## 2024-04-04 NOTE — Interval H&P Note (Signed)
 History and Physical Interval Note:  04/04/2024 3:10 PM  Brandon [name]  has presented today for surgery, with the diagnosis of ESRD.  The various methods of treatment have been discussed with the patient and family. After consideration of risks, benefits and other options for treatment, the patient has consented to  Procedure(s): TEMPORARY DIALYSIS CATHETER (N/A) as a surgical intervention.  The patient's history has been reviewed, patient examined, no change in status, stable for surgery.  I have reviewed the patient's chart and labs.  Questions were answered to the patient's satisfaction.     Bobbie Virden

## 2024-04-04 NOTE — Progress Notes (Signed)
 Dr. Vonna Guardian in at bedside now: pt. Refuses to have temp. Cath placed to groin area. MD spoke with pt. , stating I will go do my other case and check back with you. Dr. Vonna Guardian texted pt. Nephrologist.

## 2024-04-04 NOTE — Progress Notes (Signed)
 Consulted on patient for malfunctioning dialysis access.  Had scheduled dialysis tech attempt to access fistula.  Report good push/pull on arterial however patient reports burning and discomfort when flushing.  Vascular surgery consulted for evaluation and attempt cath placement.  Will provide formal consult in a.m.

## 2024-04-04 NOTE — Op Note (Signed)
  OPERATIVE NOTE   PROCEDURE: Ultrasound guidance for vascular access right femoral vein Placement of a 30 cm triple lumen dialysis catheter right femoral vein  PRE-OPERATIVE DIAGNOSIS: 1. ESRD, hyperkalemia, non-functional arm access  POST-OPERATIVE DIAGNOSIS: Same  SURGEON: Mikki Alexander, MD  ASSISTANT(S): None  ANESTHESIA: local  ESTIMATED BLOOD LOSS: Minimal   FINDING(S): 1.  None  SPECIMEN(S):  None  INDICATIONS:    Patient is a 62 y.o.male who presents with hyperkalemia and a non-functional left arm access.  Risks and benefits were discussed, and informed consent was obtained..  DESCRIPTION: After obtaining full informed written consent, the patient was laid flat in the bed.  The right groin was sterilely prepped and draped in a sterile surgical field was created. The right femoral vein was visualized with ultrasound and found to be widely patent. It was then accessed under direct guidance without difficulty with a Seldinger needle and a permanent image was recorded. A J-wire was then placed. After skin nick and dilatation, a 30 cm triple lumen dialysis catheter was placed over the wire and the wire was removed. The lumens withdrew dark red nonpulsatile blood and flushed easily with sterile saline. The catheter was secured to the skin with 3 nylon sutures. Sterile dressing was placed.  COMPLICATIONS: None  CONDITION: Stable  Mikki Alexander 04/04/2024 4:15 PM  This note was created with Dragon Medical transcription system. Any errors in dictation are purely unintentional.

## 2024-04-04 NOTE — Consult Note (Signed)
 Hospital Consult    Reason for Consult:  Non functioning Catheter. Needs Temporary dialysis access Requesting Physician:  Anola King NP  MRN #:  [id number]  History of Present Illness: This is a 63 y.o. male male with medical history significant of ESRD on HD MWF, IDDM, HTN, recently poorly functioning L UE brachiocephalic fistula, presented with HD access problems. Patient was hospitalized last week for malfunctioning left arm AV fistula, workup however showed a patent left arm AV fistula without significant stenosis and patient was able to complete inpatient HD and sent home. Subsequently patient underwent a routine HD on Friday and completed 3 hours HD. This Monday hypertension went to his outpatient HD and staff found left arm AV fistula again not functioning properly and HD canceled. Today, patient went back to HD center and was told left arm AV fistula still not functioning and sent to ED instead.  Vascular surgery was consulted to evaluate.  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   left and right; cryoablation bil   Depression    BH Adm in [place]   Diabetes mellitus    DKA prior hospitalization   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 ischemic stroke   GERD (gastroesophageal reflux disease)    Hiatal hernia    Hyperlipidemia    Hypertension    Seizures (HCC)    none since 2016, taking Keppra  - maw   Sleep apnea    Thyroid  disease    "weak thyroid " per MD   Wears glasses     Past Surgical History:  Procedure Laterality Date   AV FISTULA PLACEMENT Left 10/14/2022   Procedure: LEFT BRACHIOCAPHALIC FISTULA CREATION;  Surgeon: Young Hensen, MD;   Location: [facility] OR;  Service: Vascular;  Laterality: Left;   BREAST BIOPSY Left 03/24/2023   US  LT BREAST BX W LOC DEV 1ST LESION IMG BX SPEC US  GUIDE 03/24/2023 [facility] MAMMOGRAPHY   COLONOSCOPY     IR DIALY SHUNT INTRO NEEDLE/INTRACATH INITIAL W/IMG LEFT Left 03/29/2024   IR FLUORO GUIDE CV LINE RIGHT  10/06/2022   IR RADIOLOGIST EVAL & MGMT  04/04/2017   IR US  GUIDE VASC ACCESS RIGHT  10/06/2022   KIDNEY SURGERY     ablation of renal cell CA - 12/28, prior one was October 2012-Dr. Yamagata   TEE WITHOUT CARDIOVERSION N/A 11/19/2013   Procedure: TRANSESOPHAGEAL ECHOCARDIOGRAM (TEE);  Surgeon: Lenise Quince, MD;  Location: [facility] ENDOSCOPY;  Service: Cardiovascular;  Laterality: N/A;    Allergies  Allergen Reactions   Apresoline  [Hydralazine ] Other (See Comments)    Chest pain if by mouth- can tolerate it via IV   Imdur  [Isosorbide  Nitrate] Other (See Comments)    Headaches   Ms Contin  [Morphine ] Itching    Prior to Admission medications   Medication Sig Start Date End Date Taking? Authorizing Provider  amLODipine  (NORVASC ) 10 MG tablet TAKE 2 TABLETS BY MOUTH EVERY DAY Patient not taking: Reported on 04/03/2024 03/27/23   Sharyon Deis, NP  glucose blood (ONETOUCH ULTRA) test strip Blood sugar testing done TID for insulin  dependant Type @ diabetes - E11.65 04/12/22   Sharyon Deis,  NP  insulin  aspart (NOVOLOG  FLEXPEN) 100 UNIT/ML FlexPen Inject 0-25 Units into the skin 3 (three) times daily as needed (blood sugar > 200).    [provider]  Lancets (ACCU-CHEK SOFT TOUCH) lancets Use as instructed Patient taking differently: 1 each by Other route as directed. 06/11/19   Tysinger, Christiane Cowing, PA-C  naproxen  sodium (ALEVE ) 220 MG tablet Take 440 mg by mouth 2 (two) times daily as needed (pain).    [provider]  sertraline  (ZOLOFT ) 100 MG tablet TAKE 1 TABLET BY MOUTH EVERY DAY 12/28/23   Laneta Pintos, MD  sevelamer  carbonate (RENVELA ) 800 MG tablet Take 1,600 mg by mouth 3  (three) times daily with meals. Patient not taking: Reported on 04/03/2024 12/12/23   [provider]    Social History   Socioeconomic History   Marital status: Single    Spouse name: Not on file   Number of children: 2   Years of education: college   Highest education level: Not on file  Occupational History   Occupation: disabled  Tobacco Use   Smoking status: Never    Passive exposure: Never   Smokeless tobacco: Never  Vaping Use   Vaping status: Never Used  Substance and Sexual Activity   Alcohol use: Not Currently    Comment: rarely   Drug use: No   Sexual activity: Never  Other Topics Concern   Not on file  Social History Narrative   Patient lives alone.   Education. Two years of college.   Not working.   Right handed.   Caffeine one soda daily. Drinks coffee    Social Drivers of Corporate investment banker Strain: Low Risk  (08/15/2023)   Overall Financial Resource Strain (CARDIA)    Difficulty of Paying Living Expenses: Not hard at all  Food Insecurity: No Food Insecurity (04/03/2024)   Hunger Vital Sign    Worried About Running Out of Food in the Last Year: Never true    Ran Out of Food in the Last Year: Never true  Transportation Needs: No Transportation Needs (04/03/2024)   PRAPARE - Administrator, Civil Service (Medical): No    Lack of Transportation (Non-Medical): No  Physical Activity: Sufficiently Active (08/15/2023)   Exercise Vital Sign    Days of Exercise per Week: 3 days    Minutes of Exercise per Session: 60 min  Stress: No Stress Concern Present (08/15/2023)   [employer] of Occupational Health - Occupational Stress Questionnaire    Feeling of Stress : Not at all  Social Connections: Moderately Integrated (08/15/2023)   Social Connection and Isolation Panel [NHANES]    Frequency of Communication with Friends and Family: More than three times a week    Frequency of Social Gatherings with Friends and Family: More than  three times a week    Attends Religious Services: More than 4 times per year    Active Member of [employer] or Organizations: Yes    Attends Banker Meetings: More than 4 times per year    Marital Status: Never married  Intimate Partner Violence: Not At Risk (04/03/2024)   Humiliation, Afraid, Rape, and Kick questionnaire    Fear of Current or Ex-Partner: No    Emotionally Abused: No    Physically Abused: No    Sexually Abused: No     Family History  Problem Relation Age of Onset   Pneumonia Mother    Breast cancer Mother    Diabetes Father  Heart disease Father    Breast cancer Sister    Prostate cancer Other    Stomach cancer Other    Colon cancer Neg Hx    Esophageal cancer Neg Hx    Rectal cancer Neg Hx     ROS: Otherwise negative unless mentioned in HPI  Physical Examination  Vitals:   04/04/24 1000 04/04/24 1211  BP: (!) 175/92 (!) 172/91  Pulse: 72 74  Resp: 11 16  Temp:  97.7 F (36.5 C)  SpO2: 100% 100%   Body mass index is 30.05 kg/m.  General:  WDWN in NAD Gait: Not observed HENT: WNL, normocephalic Pulmonary: normal non-labored breathing, without Rales, rhonchi,  wheezing Cardiac: regular, without  Murmurs, rubs or gallops; without carotid bruits Abdomen: Positive bowel sounds throughout, soft, NT/ND, no masses Skin: without rashes Vascular Exam/Pulses: Palpable pulses throughout.  Left upper arm fistula with very weak thrill palpated. Extremities: without ischemic changes, without Gangrene , without cellulitis; without open wounds;  Musculoskeletal: no muscle wasting or atrophy  Neurologic: A&O X 3;  No focal weakness or paresthesias are detected; speech is fluent/normal.  Patient has a history of autism Psychiatric:  The pt has Normal affect.  Patient has a history of autism.  Family so is at the bedside with him. Lymph:  Unremarkable  CBC    Component Value Date/Time   WBC 4.1 04/04/2024 0847   RBC 3.49 (L) 04/04/2024 0847   HGB  11.2 (L) 04/04/2024 0847   HGB 10.1 (L) 04/19/2022 1033   HCT 33.2 (L) 04/04/2024 0847   HCT 30.2 (L) 04/19/2022 1033   PLT 214 04/04/2024 0847   PLT 229 04/19/2022 1033   MCV 95.1 04/04/2024 0847   MCV 89 04/19/2022 1033   MCH 32.1 04/04/2024 0847   MCHC 33.7 04/04/2024 0847   RDW 12.6 04/04/2024 0847   RDW 12.8 04/19/2022 1033   LYMPHSABS 1.6 04/04/2024 0847   LYMPHSABS 1.8 04/19/2022 1033   MONOABS 0.3 04/04/2024 0847   EOSABS 0.1 04/04/2024 0847   EOSABS 0.1 04/19/2022 1033   BASOSABS 0.0 04/04/2024 0847   BASOSABS 0.0 04/19/2022 1033    BMET    Component Value Date/Time   NA 140 04/04/2024 0847   NA 146 (H) 04/19/2022 1033   K 6.1 (H) 04/04/2024 0847   CL 99 04/04/2024 0847   CO2 23 04/04/2024 0847   GLUCOSE 127 (H) 04/04/2024 0847   BUN 124 (H) 04/04/2024 0847   BUN 62 (H) 04/19/2022 1033   CREATININE 19.53 (H) 04/04/2024 0847   CREATININE 1.23 06/02/2017 0719   CALCIUM  7.0 (L) 04/04/2024 0847   GFRNONAA 2 (L) 04/04/2024 0847   GFRNONAA 86 05/21/2013 1045   GFRAA 44 (L) 09/18/2020 1436   GFRAA >89 05/21/2013 1045    COAGS: Lab Results  Component Value Date   INR 1.2 11/29/2021   INR 1.3 (H) 07/04/2021   INR 1.17 01/08/2016     Non-Invasive Vascular Imaging:   EXAM: LEFT UPPER EXTREMITY DIALYSIS FISTULOGRAM   MEDICATIONS: None.   ANESTHESIA/SEDATION: None   FLUOROSCOPY: Radiation Exposure Index (as provided by the fluoroscopic device): 45 mGy Kerma   CONTRAST:  60 mL Omnipaque  300   COMPLICATIONS: None immediate.   PROCEDURE: Informed written consent was obtained from the patient after a thorough discussion of the procedural risks, benefits and alternatives. All questions were addressed. A timeout was performed prior to the initiation of the procedure.   Ultrasound confirmed a patent left upper extremity AV fistula. Left upper  arm AV fistula was cannulated using an Angiocath. Fistulogram images were obtained. The Angiocath was removed  with manual compression at the end of the procedure. Bandage placed over the puncture site.   FINDINGS: Ultrasound images demonstrated a patent AV fistula in the left upper arm. Arterial anastomosis appeared to be widely patent on ultrasound.   Fistulogram images demonstrated a patent left cephalic vein. Two large branch vessels coming off the cephalic vein draining into the brachial venous system. Cephalic arch is patent without significant stenosis. Central veins are patent. It was difficult to reflux contrast to the arterial anastomosis and likely related to brisk flow at the anastomosis based on the ultrasound findings.   IMPRESSION: 1. Left upper extremity AV fistula is patent. No significant stenosis. 2. Two large branch vessels coming off the left cephalic vein could be causing competitive flow.   ACCESS: This access remains amenable to future percutaneous interventions as clinically indicated.  Statin:  No. Beta Blocker:  No. Aspirin :  No. ACEI:  No. ARB:  No. CCB use:  Yes Other antiplatelets/anticoagulants:  No.    ASSESSMENT/PLAN: This is a 63 y.o. male who presents to [facility] emergency department with complaints of pain to his fistula access for dialysis.  On 03/29/2024 patient underwent a left upper extremity angiogram which showed left upper extremity AV fistula with a patent with no significant stenosis.  However they did mention that there were 2 large branch vessels coming off the left cephalic vein which could be causing competitive flow which may be related to his pain.  Vascular surgery was consulted for temporary dialysis access.  Patient requested a permacath but he was last dialyzed last Friday on 03/29/2024.  Today's BUN is 124, creatinine 19.53, and GFR of 2.  Due to severely elevated BUN patient will only be able to receive a temp cath today with possible placement of PermCath next week.  Vascular surgery plans on taking the patient to the vascular lab  later today on 04/04/2024 for temporary dialysis access placement.  I did detailed discussion at the bedside in the emergency room with the patient and family members regarding the procedure.  They would like to proceed as soon as possible.  Patient does not need to be n.p.o. for this procedure.  Patient states he last ate breakfast at 9 AM this morning.  -Discussed the case with Dr. Mikki Alexander MD and he agrees with plan   Annamaria Barrette [facility] 04/04/2024 12:56 PM

## 2024-04-04 NOTE — H&P (View-Only) (Signed)
 Hospital Consult    Reason for Consult:  Non functioning Catheter. Needs Temporary dialysis access Requesting Physician:  Anola King NP  MRN #:  [id number]  History of Present Illness: This is a 63 y.o. male male with medical history significant of ESRD on HD MWF, IDDM, HTN, recently poorly functioning L UE brachiocephalic fistula, presented with HD access problems. Patient was hospitalized last week for malfunctioning left arm AV fistula, workup however showed a patent left arm AV fistula without significant stenosis and patient was able to complete inpatient HD and sent home. Subsequently patient underwent a routine HD on Friday and completed 3 hours HD. This Monday hypertension went to his outpatient HD and staff found left arm AV fistula again not functioning properly and HD canceled. Today, patient went back to HD center and was told left arm AV fistula still not functioning and sent to ED instead.  Vascular surgery was consulted to evaluate.  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   left and right; cryoablation bil   Depression    BH Adm in [place]   Diabetes mellitus    DKA prior hospitalization   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 ischemic stroke   GERD (gastroesophageal reflux disease)    Hiatal hernia    Hyperlipidemia    Hypertension    Seizures (HCC)    none since 2016, taking Keppra  - maw   Sleep apnea    Thyroid  disease    "weak thyroid " per MD   Wears glasses     Past Surgical History:  Procedure Laterality Date   AV FISTULA PLACEMENT Left 10/14/2022   Procedure: LEFT BRACHIOCAPHALIC FISTULA CREATION;  Surgeon: Young Hensen, MD;   Location: [facility] OR;  Service: Vascular;  Laterality: Left;   BREAST BIOPSY Left 03/24/2023   US  LT BREAST BX W LOC DEV 1ST LESION IMG BX SPEC US  GUIDE 03/24/2023 [facility] MAMMOGRAPHY   COLONOSCOPY     IR DIALY SHUNT INTRO NEEDLE/INTRACATH INITIAL W/IMG LEFT Left 03/29/2024   IR FLUORO GUIDE CV LINE RIGHT  10/06/2022   IR RADIOLOGIST EVAL & MGMT  04/04/2017   IR US  GUIDE VASC ACCESS RIGHT  10/06/2022   KIDNEY SURGERY     ablation of renal cell CA - 12/28, prior one was October 2012-Dr. Yamagata   TEE WITHOUT CARDIOVERSION N/A 11/19/2013   Procedure: TRANSESOPHAGEAL ECHOCARDIOGRAM (TEE);  Surgeon: Lenise Quince, MD;  Location: [facility] ENDOSCOPY;  Service: Cardiovascular;  Laterality: N/A;    Allergies  Allergen Reactions   Apresoline  [Hydralazine ] Other (See Comments)    Chest pain if by mouth- can tolerate it via IV   Imdur  [Isosorbide  Nitrate] Other (See Comments)    Headaches   Ms Contin  [Morphine ] Itching    Prior to Admission medications   Medication Sig Start Date End Date Taking? Authorizing Provider  amLODipine  (NORVASC ) 10 MG tablet TAKE 2 TABLETS BY MOUTH EVERY DAY Patient not taking: Reported on 04/03/2024 03/27/23   Sharyon Deis, NP  glucose blood (ONETOUCH ULTRA) test strip Blood sugar testing done TID for insulin  dependant Type @ diabetes - E11.65 04/12/22   Sharyon Deis,  NP  insulin  aspart (NOVOLOG  FLEXPEN) 100 UNIT/ML FlexPen Inject 0-25 Units into the skin 3 (three) times daily as needed (blood sugar > 200).    [provider]  Lancets (ACCU-CHEK SOFT TOUCH) lancets Use as instructed Patient taking differently: 1 each by Other route as directed. 06/11/19   Tysinger, Christiane Cowing, PA-C  naproxen  sodium (ALEVE ) 220 MG tablet Take 440 mg by mouth 2 (two) times daily as needed (pain).    [provider]  sertraline  (ZOLOFT ) 100 MG tablet TAKE 1 TABLET BY MOUTH EVERY DAY 12/28/23   Laneta Pintos, MD  sevelamer  carbonate (RENVELA ) 800 MG tablet Take 1,600 mg by mouth 3  (three) times daily with meals. Patient not taking: Reported on 04/03/2024 12/12/23   [provider]    Social History   Socioeconomic History   Marital status: Single    Spouse name: Not on file   Number of children: 2   Years of education: college   Highest education level: Not on file  Occupational History   Occupation: disabled  Tobacco Use   Smoking status: Never    Passive exposure: Never   Smokeless tobacco: Never  Vaping Use   Vaping status: Never Used  Substance and Sexual Activity   Alcohol use: Not Currently    Comment: rarely   Drug use: No   Sexual activity: Never  Other Topics Concern   Not on file  Social History Narrative   Patient lives alone.   Education. Two years of college.   Not working.   Right handed.   Caffeine one soda daily. Drinks coffee    Social Drivers of Corporate investment banker Strain: Low Risk  (08/15/2023)   Overall Financial Resource Strain (CARDIA)    Difficulty of Paying Living Expenses: Not hard at all  Food Insecurity: No Food Insecurity (04/03/2024)   Hunger Vital Sign    Worried About Running Out of Food in the Last Year: Never true    Ran Out of Food in the Last Year: Never true  Transportation Needs: No Transportation Needs (04/03/2024)   PRAPARE - Administrator, Civil Service (Medical): No    Lack of Transportation (Non-Medical): No  Physical Activity: Sufficiently Active (08/15/2023)   Exercise Vital Sign    Days of Exercise per Week: 3 days    Minutes of Exercise per Session: 60 min  Stress: No Stress Concern Present (08/15/2023)   [employer] of Occupational Health - Occupational Stress Questionnaire    Feeling of Stress : Not at all  Social Connections: Moderately Integrated (08/15/2023)   Social Connection and Isolation Panel [NHANES]    Frequency of Communication with Friends and Family: More than three times a week    Frequency of Social Gatherings with Friends and Family: More than  three times a week    Attends Religious Services: More than 4 times per year    Active Member of [employer] or Organizations: Yes    Attends Banker Meetings: More than 4 times per year    Marital Status: Never married  Intimate Partner Violence: Not At Risk (04/03/2024)   Humiliation, Afraid, Rape, and Kick questionnaire    Fear of Current or Ex-Partner: No    Emotionally Abused: No    Physically Abused: No    Sexually Abused: No     Family History  Problem Relation Age of Onset   Pneumonia Mother    Breast cancer Mother    Diabetes Father  Heart disease Father    Breast cancer Sister    Prostate cancer Other    Stomach cancer Other    Colon cancer Neg Hx    Esophageal cancer Neg Hx    Rectal cancer Neg Hx     ROS: Otherwise negative unless mentioned in HPI  Physical Examination  Vitals:   04/04/24 1000 04/04/24 1211  BP: (!) 175/92 (!) 172/91  Pulse: 72 74  Resp: 11 16  Temp:  97.7 F (36.5 C)  SpO2: 100% 100%   Body mass index is 30.05 kg/m.  General:  WDWN in NAD Gait: Not observed HENT: WNL, normocephalic Pulmonary: normal non-labored breathing, without Rales, rhonchi,  wheezing Cardiac: regular, without  Murmurs, rubs or gallops; without carotid bruits Abdomen: Positive bowel sounds throughout, soft, NT/ND, no masses Skin: without rashes Vascular Exam/Pulses: Palpable pulses throughout.  Left upper arm fistula with very weak thrill palpated. Extremities: without ischemic changes, without Gangrene , without cellulitis; without open wounds;  Musculoskeletal: no muscle wasting or atrophy  Neurologic: A&O X 3;  No focal weakness or paresthesias are detected; speech is fluent/normal.  Patient has a history of autism Psychiatric:  The pt has Normal affect.  Patient has a history of autism.  Family so is at the bedside with him. Lymph:  Unremarkable  CBC    Component Value Date/Time   WBC 4.1 04/04/2024 0847   RBC 3.49 (L) 04/04/2024 0847   HGB  11.2 (L) 04/04/2024 0847   HGB 10.1 (L) 04/19/2022 1033   HCT 33.2 (L) 04/04/2024 0847   HCT 30.2 (L) 04/19/2022 1033   PLT 214 04/04/2024 0847   PLT 229 04/19/2022 1033   MCV 95.1 04/04/2024 0847   MCV 89 04/19/2022 1033   MCH 32.1 04/04/2024 0847   MCHC 33.7 04/04/2024 0847   RDW 12.6 04/04/2024 0847   RDW 12.8 04/19/2022 1033   LYMPHSABS 1.6 04/04/2024 0847   LYMPHSABS 1.8 04/19/2022 1033   MONOABS 0.3 04/04/2024 0847   EOSABS 0.1 04/04/2024 0847   EOSABS 0.1 04/19/2022 1033   BASOSABS 0.0 04/04/2024 0847   BASOSABS 0.0 04/19/2022 1033    BMET    Component Value Date/Time   NA 140 04/04/2024 0847   NA 146 (H) 04/19/2022 1033   K 6.1 (H) 04/04/2024 0847   CL 99 04/04/2024 0847   CO2 23 04/04/2024 0847   GLUCOSE 127 (H) 04/04/2024 0847   BUN 124 (H) 04/04/2024 0847   BUN 62 (H) 04/19/2022 1033   CREATININE 19.53 (H) 04/04/2024 0847   CREATININE 1.23 06/02/2017 0719   CALCIUM  7.0 (L) 04/04/2024 0847   GFRNONAA 2 (L) 04/04/2024 0847   GFRNONAA 86 05/21/2013 1045   GFRAA 44 (L) 09/18/2020 1436   GFRAA >89 05/21/2013 1045    COAGS: Lab Results  Component Value Date   INR 1.2 11/29/2021   INR 1.3 (H) 07/04/2021   INR 1.17 01/08/2016     Non-Invasive Vascular Imaging:   EXAM: LEFT UPPER EXTREMITY DIALYSIS FISTULOGRAM   MEDICATIONS: None.   ANESTHESIA/SEDATION: None   FLUOROSCOPY: Radiation Exposure Index (as provided by the fluoroscopic device): 45 mGy Kerma   CONTRAST:  60 mL Omnipaque  300   COMPLICATIONS: None immediate.   PROCEDURE: Informed written consent was obtained from the patient after a thorough discussion of the procedural risks, benefits and alternatives. All questions were addressed. A timeout was performed prior to the initiation of the procedure.   Ultrasound confirmed a patent left upper extremity AV fistula. Left upper  arm AV fistula was cannulated using an Angiocath. Fistulogram images were obtained. The Angiocath was removed  with manual compression at the end of the procedure. Bandage placed over the puncture site.   FINDINGS: Ultrasound images demonstrated a patent AV fistula in the left upper arm. Arterial anastomosis appeared to be widely patent on ultrasound.   Fistulogram images demonstrated a patent left cephalic vein. Two large branch vessels coming off the cephalic vein draining into the brachial venous system. Cephalic arch is patent without significant stenosis. Central veins are patent. It was difficult to reflux contrast to the arterial anastomosis and likely related to brisk flow at the anastomosis based on the ultrasound findings.   IMPRESSION: 1. Left upper extremity AV fistula is patent. No significant stenosis. 2. Two large branch vessels coming off the left cephalic vein could be causing competitive flow.   ACCESS: This access remains amenable to future percutaneous interventions as clinically indicated.  Statin:  No. Beta Blocker:  No. Aspirin :  No. ACEI:  No. ARB:  No. CCB use:  Yes Other antiplatelets/anticoagulants:  No.    ASSESSMENT/PLAN: This is a 63 y.o. male who presents to [facility] emergency department with complaints of pain to his fistula access for dialysis.  On 03/29/2024 patient underwent a left upper extremity angiogram which showed left upper extremity AV fistula with a patent with no significant stenosis.  However they did mention that there were 2 large branch vessels coming off the left cephalic vein which could be causing competitive flow which may be related to his pain.  Vascular surgery was consulted for temporary dialysis access.  Patient requested a permacath but he was last dialyzed last Friday on 03/29/2024.  Today's BUN is 124, creatinine 19.53, and GFR of 2.  Due to severely elevated BUN patient will only be able to receive a temp cath today with possible placement of PermCath next week.  Vascular surgery plans on taking the patient to the vascular lab  later today on 04/04/2024 for temporary dialysis access placement.  I did detailed discussion at the bedside in the emergency room with the patient and family members regarding the procedure.  They would like to proceed as soon as possible.  Patient does not need to be n.p.o. for this procedure.  Patient states he last ate breakfast at 9 AM this morning.  -Discussed the case with Dr. Mikki Alexander MD and he agrees with plan   Annamaria [facility]rette [facility] 04/04/2024 12:56 PM

## 2024-04-04 NOTE — Progress Notes (Signed)
 Hemodialysis Note:  Received patient in bed to unit. Alert and oriented  Access used: Left AVF Access issues: AVF not working  Patient unable to do his treatment today due to AVF not working.Transported back to room, alert without acute distress. Report given to patient's RN.  [facility]

## 2024-04-05 ENCOUNTER — Encounter: Payer: Self-pay | Admitting: Vascular Surgery

## 2024-04-05 DIAGNOSIS — I5033 Acute on chronic diastolic (congestive) heart failure: Secondary | ICD-10-CM

## 2024-04-05 DIAGNOSIS — I2510 Atherosclerotic heart disease of native coronary artery without angina pectoris: Secondary | ICD-10-CM | POA: Diagnosis present

## 2024-04-05 DIAGNOSIS — D631 Anemia in chronic kidney disease: Secondary | ICD-10-CM | POA: Diagnosis present

## 2024-04-05 DIAGNOSIS — Z794 Long term (current) use of insulin: Secondary | ICD-10-CM | POA: Diagnosis not present

## 2024-04-05 DIAGNOSIS — F840 Autistic disorder: Secondary | ICD-10-CM | POA: Diagnosis present

## 2024-04-05 DIAGNOSIS — E785 Hyperlipidemia, unspecified: Secondary | ICD-10-CM | POA: Diagnosis present

## 2024-04-05 DIAGNOSIS — T82898A Other specified complication of vascular prosthetic devices, implants and grafts, initial encounter: Secondary | ICD-10-CM | POA: Diagnosis not present

## 2024-04-05 DIAGNOSIS — I120 Hypertensive chronic kidney disease with stage 5 chronic kidney disease or end stage renal disease: Secondary | ICD-10-CM | POA: Diagnosis not present

## 2024-04-05 DIAGNOSIS — Z803 Family history of malignant neoplasm of breast: Secondary | ICD-10-CM | POA: Diagnosis not present

## 2024-04-05 DIAGNOSIS — Z85528 Personal history of other malignant neoplasm of kidney: Secondary | ICD-10-CM | POA: Diagnosis not present

## 2024-04-05 DIAGNOSIS — Z992 Dependence on renal dialysis: Secondary | ICD-10-CM | POA: Diagnosis not present

## 2024-04-05 DIAGNOSIS — Z888 Allergy status to other drugs, medicaments and biological substances status: Secondary | ICD-10-CM | POA: Diagnosis not present

## 2024-04-05 DIAGNOSIS — Z8042 Family history of malignant neoplasm of prostate: Secondary | ICD-10-CM | POA: Diagnosis not present

## 2024-04-05 DIAGNOSIS — E875 Hyperkalemia: Secondary | ICD-10-CM | POA: Diagnosis present

## 2024-04-05 DIAGNOSIS — N186 End stage renal disease: Secondary | ICD-10-CM | POA: Diagnosis present

## 2024-04-05 DIAGNOSIS — Z833 Family history of diabetes mellitus: Secondary | ICD-10-CM | POA: Diagnosis not present

## 2024-04-05 DIAGNOSIS — Z79899 Other long term (current) drug therapy: Secondary | ICD-10-CM | POA: Diagnosis not present

## 2024-04-05 DIAGNOSIS — F919 Conduct disorder, unspecified: Secondary | ICD-10-CM | POA: Diagnosis present

## 2024-04-05 DIAGNOSIS — J449 Chronic obstructive pulmonary disease, unspecified: Secondary | ICD-10-CM | POA: Diagnosis present

## 2024-04-05 DIAGNOSIS — T82510A Breakdown (mechanical) of surgically created arteriovenous fistula, initial encounter: Secondary | ICD-10-CM | POA: Diagnosis present

## 2024-04-05 DIAGNOSIS — Z8673 Personal history of transient ischemic attack (TIA), and cerebral infarction without residual deficits: Secondary | ICD-10-CM | POA: Diagnosis not present

## 2024-04-05 DIAGNOSIS — F419 Anxiety disorder, unspecified: Secondary | ICD-10-CM | POA: Diagnosis present

## 2024-04-05 DIAGNOSIS — Y832 Surgical operation with anastomosis, bypass or graft as the cause of abnormal reaction of the patient, or of later complication, without mention of misadventure at the time of the procedure: Secondary | ICD-10-CM | POA: Diagnosis present

## 2024-04-05 DIAGNOSIS — Z8249 Family history of ischemic heart disease and other diseases of the circulatory system: Secondary | ICD-10-CM | POA: Diagnosis not present

## 2024-04-05 DIAGNOSIS — E1122 Type 2 diabetes mellitus with diabetic chronic kidney disease: Secondary | ICD-10-CM | POA: Diagnosis present

## 2024-04-05 DIAGNOSIS — Z800 Family history of malignant neoplasm of digestive organs: Secondary | ICD-10-CM | POA: Diagnosis not present

## 2024-04-05 DIAGNOSIS — Z885 Allergy status to narcotic agent status: Secondary | ICD-10-CM | POA: Diagnosis not present

## 2024-04-05 LAB — RENAL FUNCTION PANEL
Albumin: 3.3 g/dL — ABNORMAL LOW (ref 3.5–5.0)
Anion gap: 20 — ABNORMAL HIGH (ref 5–15)
BUN: 117 mg/dL — ABNORMAL HIGH (ref 6–20)
CO2: 20 mmol/L — ABNORMAL LOW (ref 22–32)
Calcium: 6.5 mg/dL — ABNORMAL LOW (ref 8.9–10.3)
Chloride: 95 mmol/L — ABNORMAL LOW (ref 98–111)
Creatinine, Ser: 20.74 mg/dL — ABNORMAL HIGH (ref 0.61–1.24)
GFR, Estimated: 2 mL/min — ABNORMAL LOW (ref >60)
Glucose, Bld: 183 mg/dL — ABNORMAL HIGH (ref 70–99)
Phosphorus: 8.8 mg/dL — ABNORMAL HIGH (ref 2.5–4.6)
Potassium: 6.4 mmol/L (ref 3.5–5.1)
Sodium: 135 mmol/L (ref 135–145)

## 2024-04-05 LAB — GLUCOSE, CAPILLARY
Glucose-Capillary: 124 mg/dL — ABNORMAL HIGH (ref 70–99)
Glucose-Capillary: 113 mg/dL — ABNORMAL HIGH (ref 70–99)
Glucose-Capillary: 97 mg/dL (ref 70–99)
Glucose-Capillary: 86 mg/dL (ref 70–99)
Glucose-Capillary: 85 mg/dL (ref 70–99)
Glucose-Capillary: 197 mg/dL — ABNORMAL HIGH (ref 70–99)

## 2024-04-05 LAB — BASIC METABOLIC PANEL WITH GFR
Anion gap: 21 — ABNORMAL HIGH (ref 5–15)
BUN: 119 mg/dL — ABNORMAL HIGH (ref 6–20)
CO2: 19 mmol/L — ABNORMAL LOW (ref 22–32)
Calcium: 7 mg/dL — ABNORMAL LOW (ref 8.9–10.3)
Chloride: 96 mmol/L — ABNORMAL LOW (ref 98–111)
Creatinine, Ser: 20.57 mg/dL — ABNORMAL HIGH (ref 0.61–1.24)
GFR, Estimated: 2 mL/min — ABNORMAL LOW (ref >60)
Glucose, Bld: 129 mg/dL — ABNORMAL HIGH (ref 70–99)
Potassium: 6.6 mmol/L (ref 3.5–5.1)
Sodium: 136 mmol/L (ref 135–145)

## 2024-04-05 LAB — CBC
HCT: 35.6 % — ABNORMAL LOW (ref 39.0–52.0)
Hemoglobin: 12 g/dL — ABNORMAL LOW (ref 13.0–17.0)
MCH: 31.5 pg (ref 26.0–34.0)
MCHC: 33.7 g/dL (ref 30.0–36.0)
MCV: 93.4 fL (ref 80.0–100.0)
Platelets: 206 10*3/uL (ref 150–400)
RBC: 3.81 MIL/uL — ABNORMAL LOW (ref 4.22–5.81)
RDW: 12.4 % (ref 11.5–15.5)
WBC: 5.6 10*3/uL (ref 4.0–10.5)
nRBC: 0 % (ref 0.0–0.2)

## 2024-04-05 MED ORDER — CALCIUM GLUCONATE-NACL 2-0.675 GM/100ML-% IV SOLN
2.0000 g | Freq: Once | INTRAVENOUS | Status: AC
  Administered 2024-04-05: 2000 mg via INTRAVENOUS
  Filled 2024-04-05: qty 100

## 2024-04-05 MED ORDER — LORAZEPAM 2 MG/ML IJ SOLN
1.0000 mg | Freq: Once | INTRAMUSCULAR | Status: AC
  Administered 2024-04-06: 1 mg via INTRAVENOUS
  Filled 2024-04-05: qty 1

## 2024-04-05 MED ORDER — LORAZEPAM 2 MG/ML IJ SOLN
1.0000 mg | Freq: Once | INTRAMUSCULAR | Status: DC
  Filled 2024-04-05: qty 1

## 2024-04-05 MED ORDER — INSULIN ASPART 100 UNIT/ML IV SOLN
5.0000 [IU] | Freq: Once | INTRAVENOUS | Status: AC
  Administered 2024-04-05: 5 [IU] via INTRAVENOUS
  Filled 2024-04-05: qty 0.05

## 2024-04-05 MED ORDER — DEXTROSE 50 % IV SOLN
1.0000 | Freq: Once | INTRAVENOUS | Status: AC
  Administered 2024-04-05: 50 mL via INTRAVENOUS
  Filled 2024-04-05: qty 50

## 2024-04-05 MED ORDER — ORAL CARE MOUTH RINSE: 15.0000 mL | OROMUCOSAL | Status: DC | PRN

## 2024-04-05 MED ORDER — LORAZEPAM 2 MG/ML IJ SOLN
1.0000 mg | Freq: Once | INTRAMUSCULAR | Status: AC
  Administered 2024-04-05: 1 mg via INTRAVENOUS
  Filled 2024-04-05: qty 1

## 2024-04-05 NOTE — TOC Initial Note (Addendum)
 Transition of Care [facility]) - Initial/Assessment Note    Patient Details  Name: Brandon [name] MRN: [id number] Date of Birth: 15-Dec-1960  [facility]) CM/SW Contact:    Elsie Halo, RN Phone Number: 04/05/2024, 4:20 PM  Clinical Narrative:                 Patient has his own place at a retirement community but spends a lot of time at his friend and HCPOA's home. The patient doesn't drive but the HCPOA ensures he gets to appts and helps him complete errands. He has a walker and canes, but choses not to use them. He currently has a PCP, Dr. Denette Finner [facility] [address] [facility] [place], [place].   The patient lives in a retirement community, but is exploring housing options.  TOC placed a list of housing resources on the patient's avs.  TOC will continue to follow.         Patient Goals and CMS Choice            Expected Discharge Plan and Services                                              Prior Living Arrangements/Services                       Activities of Daily Living   ADL Screening (condition at time of admission) Independently performs ADLs?: Yes (appropriate for developmental age) Is the patient deaf or have difficulty hearing?: No Does the patient have difficulty seeing, even when wearing glasses/contacts?: Yes Does the patient have difficulty concentrating, remembering, or making decisions?: No  Permission Sought/Granted                  Emotional Assessment              Admission diagnosis:  End stage renal disease (HCC) [N18.6] Hyperkalemia [E87.5] ESRD (end stage renal disease) on dialysis (HCC) [N18.6, Z99.2] Patient Active Problem List   Diagnosis Date Noted   Hyperkalemia 04/04/2024   ESRD (end stage renal disease) on dialysis (HCC) 03/28/2024   AV fistula occlusion, initial encounter (HCC) 03/28/2024   Uremic pruritus 10/05/2023   Chronic bilateral low back pain without sciatica  09/06/2023   Chest pain 02/25/2023   End stage renal disease (HCC) 11/29/2022   Hemodialysis status (HCC) 11/29/2022   Acute respiratory failure with hypoxia (HCC) 10/02/2022   Hypertensive urgency 10/02/2022   Acute on chronic diastolic CHF (congestive heart failure) (HCC) 10/02/2022   Anemia due to chronic kidney disease 10/02/2022   Cognitive impairment 10/02/2022   Diabetic nephropathy associated with type 2 diabetes mellitus (HCC) 04/28/2022   Iron deficiency anemia 04/28/2022   Obesity (BMI 30-39.9) 04/17/2022   Protein-calorie malnutrition, severe 12/01/2021   Acute renal failure superimposed on stage 4 chronic kidney disease (HCC) 11/30/2021   AMS (altered mental status) 11/29/2021   Hypokalemia 11/29/2021   Callus 10/19/2021   Enlarged and hypertrophic nails 10/19/2021   Open wound of toe 10/19/2021   Mental health disorder 10/19/2021   Delirium 09/21/2021   Polypharmacy 09/18/2020   Anemia 04/16/2020   Anxiety 04/16/2020   Controlled type 2 diabetes mellitus without complication, without long-term current use of insulin  (HCC) 02/09/2020   Type 2 diabetes mellitus with diabetic polyneuropathy, with long-term current  use of insulin  (HCC) 02/09/2020   Family history of premature CAD 01/27/2020   Stage 3b chronic kidney disease (HCC) 01/09/2020   Incontinence of feces 01/09/2020   Chronic pain of left knee 01/09/2020   Fall 01/09/2020   History of diverticulitis 10/30/2019   Noncompliance 06/10/2019   History of renal cell cancer 04/24/2018   Erectile dysfunction 04/24/2018   Edema 03/15/2018   Seizure disorder (HCC) 03/01/2018   History of stroke 11/29/2017   Obstructive sleep apnea 11/29/2017   Hyperlipidemia associated with type 2 diabetes mellitus (HCC) 05/10/2017   Hypertension associated with diabetes (HCC) 05/10/2017   History of cerebrovascular accident (CVA) with residual deficit 05/10/2017   Diabetic ulcer of left foot associated with secondary diabetes  mellitus (HCC) 04/21/2016   Depression, recurrent (HCC) 09/21/2014   PCP:  Laneta Pintos, MD Pharmacy:   [id number] [place], [place] - [address] [place] [address] Phone: [phone] Fax: [phone]     Social Drivers of Health (SDOH) Social History: SDOH Screenings   Food Insecurity: Patient Declined (04/04/2024)  Housing: Patient Declined (04/04/2024)  Transportation Needs: Patient Declined (04/04/2024)  Utilities: Patient Declined (04/04/2024)  Alcohol Screen: Low Risk  (08/15/2023)  Depression (PHQ2-9): Low Risk  (04/03/2024)  Financial Resource Strain: Low Risk  (08/15/2023)  Physical Activity: Sufficiently Active (08/15/2023)  Social Connections: Patient Declined (04/04/2024)  Stress: No Stress Concern Present (08/15/2023)  Tobacco Use: Low Risk  (04/04/2024)  Health Literacy: Adequate Health Literacy (08/15/2023)   SDOH Interventions:     Readmission Risk Interventions     No data to display

## 2024-04-05 NOTE — Discharge Instructions (Signed)
 Rent/Utility/Housing  Agency Name: [facility] Agency Address: [address] [place], [place] [address] Phone: [phone] Email: [email or web address] Website: [email or web address] Service(s) Offered: Housing services, self-sufficiency, congregate meal program, weatherization program, Field seismologist program, emergency food assistance,  housing counseling, home ownership program, wheels -towork program.  Agency Name: [facility] Address: [address] [place], [place] [address] Phone: [phone] (8a-4p) [phone] (8p- 10p) Email: [email or web address] Website: [email or web address] Service(s) Offered: A program for homeless and/or needy men that includes one-on-one counseling, life skills training and job rehabilitation.  Agency Name: [facility] [place] Address: [address] [place], [place] [address] Phone: [phone] Website: www.[email or web address].org Service(s) Offered: Assistance to needy in emergency with utility bills, heating fuel, and prescriptions. Shelter for homeless 7pm-7am. March 30, 2017 15  Agency Name: [facility] [place] [facility] Address: [address], [place], [place] [address] Phone: [phone] Contact Person: Cathleen Corti Email: [email or web address] Website: [email or web address] Service(s) Offered: Helps individuals with developmental disabilities move from housing that is more restrictive to homes where they  can achieve greater independence and have more  opportunities.  Agency Name: [facility] Address: [address], [place], [place] [address] Phone: [phone] Email: [email or web address] Website: www.[email or web address].org Service(s) Offered: Provides affordable housing for low-income families, elderly, and disabled individuals. Offer a wide range of  programs and services, from financial planning to  afterschool and summer programs.  Agency Name: [facility] [employer] Address: [address] [place], [place] [address] Phone: [phone] Service(s) Offered: Child support services; child welfare services; food stamps; Medicaid; work first family assistance; and aid with fuel,  rent, food and medicine.  Agency Name: [facility] [place] [employer]. Address: [facility] [address], [place], [place]  [address] Phone: [phone] Website: www.familyabuseservices.org Service(s) Offered: 24 hour Crisis Line: [phone]; 24 hour Emergency Shelter; Transitional Housing; Support Groups; Scientist, physiological; [employer]; Hispanic Outreach: [phone];  Visitation Center: [phone].  Agency Name: [facility] [place]. Address: [address] [place], [place] [address] Phone: [phone] Service(s) Offered: CAP Services; Home and [employer]; Individual or Group Supports; Respite Care Non-Institutional Nursing;  Residential Supports; Respite Care and Personal Care Services; Transportation; Family and Friends Night; Recreational Activities; Three Nutritious Meals/Snacks; Consultation with Registered Dietician; Twenty-four hour Registered Nurse Access; Daily and [employer]; Camp Green Leaves; [place] for the [employer] (During Summer Months) Bingo Night (Every  Wednesday Night); Special Populations Dance Night  (Every Tuesday Night); Professional Hair Care Services.  Agency Name: [facility] Address: [address], [place], [place] [address] Phone: [phone] Contact Person: Jabier Mutton Website: [email or web address].Physicist, medical) Offered: Residential treatment facility for women; food and  clothing, educational & employment development and  transportation to work; Counsellor of financial skills;  parenting and family reunification; emotional and spiritual  support;  transitional housing for program graduates.  Agency Name: [facility] Address: [address] [place], [place] [address] Phone: [phone] Email: [email or web address] Website: [email or web address].es Service(s) Offered: Public housing units for elderly, disabled, and low income people; housing choice vouchers for income eligible  applicants; shelter plus care vouchers; and Psychologist, clinical.  Agency Name: [facility] [employer] Address: [address] [place], [place] [address] Phone: [phone] Email: [email or web address] Website: www.habitatalamance.org Service(s) Offered: Build houses for families in need of decent housing. Each adult in the family must invest 200 hours of labor on  someone else's house, work with volunteers to build their own house, attend classes  on budgeting, home maintenance, yard care, and attend homeowner association meetings.  Agency Name: [facility] Address: [address], [place], [place] [address] Phone: [phone] Website: www.rsli.org Service(s) Offered: Intermediate care facilities for intellectually delayed, Supervised Living in group homes for adults with developmental disabilities, Supervised Living for people who have dual diagnoses (MRMI), Independent Living, Supported Living, respite and a variety of CAP services, pre-vocational services, day supports, and [employer].  Agency Name: [place] [employer] Phone: [phone] Website: www.NCForeclosurePrevention.gov Service(s) Offered: Zero-interest, deferred loans to homeowners struggling to pay their mortgage. Call for more information.

## 2024-04-05 NOTE — Progress Notes (Signed)
 Spoke with dialysis RN and was told to hold off on giving the calcium  gluconate due at 0700. Sent Secure Chat message to inform Dr. Rhesa Celeste. Message acknowledged.

## 2024-04-05 NOTE — Progress Notes (Signed)
 [facility]  ROUNDING NOTE   Subjective:   Brandon [name] is a 63 year old male patient known to our practice.  Currently receives outpatient dialysis treatments at [facility] on a TTS schedule, supervised by Dr. Rhesa Celeste.  Patient presents to the emergency department with a malfunctioning dialysis access.  Patient's last dialysis treatment was received last Friday.  Did have staff attempt to cannulate fistula yesterday, patient complained of discomfort at arterial site.  Vascular was consulted for placement of temp cath at that time.  Patient seen sitting up in chair today.  This provider received call from HD staff stating patient was argumentative and aggressive when attempting his treatment this morning.  Patient does have history of autism, and outpatient clinic confirms patient becomes aggressive when overwhelmed.  Patient has completed breakfast tray, appears calm and agreeable to dialysis treatment.  Will proceed.  Potassium elevated on ED arrival, 6.1.  Patient treated with temporizing measures, remains 6.4 last night with recheck this morning of 6.6.  Patient given temporizing measures.   Objective:  Vital signs in last 24 hours:  Temp:  [97.5 F (36.4 C)-98.6 F (37 C)] 97.9 F (36.6 C) (05/02 1138) Pulse Rate:  [66-84] 72 (05/02 1138) Resp:  [4-20] 11 (05/02 1150) BP: (135-174)/(62-95) 170/87 (05/02 1150) SpO2:  [100 %] 100 % (05/02 1150) Weight:  [100.5 kg-105.8 kg] 105.8 kg (05/02 1138)  Weight change:  Filed Weights   04/04/24 0815 04/04/24 1506 04/05/24 1138  Weight: 100.5 kg 100.5 kg 105.8 kg    Intake/Output: I/O last 3 completed shifts: In: 240 [P.O.:240] Out: -    Intake/Output this shift:  No intake/output data recorded.  Physical Exam: General: NAD,   Head: Normocephalic, atraumatic. Moist oral mucosal membranes  Eyes: Anicteric  Lungs:  Clear to auscultation  Heart: Regular rate and rhythm  Abdomen:  Soft, nontender,  nondistended  Extremities: Trace peripheral edema.  Neurologic: Alert and oriented, moving all four extremities  Skin: No lesions  Access: Left aVF, right femoral HD temp cath    Basic Metabolic Panel: Recent Labs  Lab 03/30/24 0547 04/04/24 0847 04/04/24 2215 04/05/24 0450  NA 136 140  --  136  K 4.4 6.1* 6.4* 6.6*  CL 97* 99  --  96*  CO2 23 23  --  19*  GLUCOSE 103* 127*  --  129*  BUN 68* 124*  --  119*  CREATININE 11.86* 19.53*  --  20.57*  CALCIUM  7.5* 7.0*  --  7.0*  MG 2.0  --   --   --     Liver Function Tests: Recent Labs  Lab 03/30/24 0547  AST 6*  ALT 9  ALKPHOS 58  BILITOT 1.0  PROT 6.8  ALBUMIN 3.3*   No results for input(s): "LIPASE", "AMYLASE" in the last 168 hours. No results for input(s): "AMMONIA" in the last 168 hours.  CBC: Recent Labs  Lab 03/30/24 0547 04/04/24 0847 04/05/24 0450  WBC 3.9* 4.1 5.6  NEUTROABS  --  2.0  --   HGB 10.9* 11.2* 12.0*  HCT 31.8* 33.2* 35.6*  MCV 91.9 95.1 93.4  PLT 198 214 206    Cardiac Enzymes: No results for input(s): "CKTOTAL", "CKMB", "CKMBINDEX", "TROPONINI" in the last 168 hours.  BNP: Invalid input(s): "POCBNP"  CBG: Recent Labs  Lab 04/04/24 1707 04/04/24 2058 04/05/24 0101 04/05/24 0736 04/05/24 0926  GLUCAP 113* 157* 124* 113* 97    Microbiology: Results for orders placed or performed during the hospital encounter of  10/02/22  Resp Panel by RT-PCR (Flu A&B, Covid) Anterior Nasal Swab     Status: None   Collection Time: 10/02/22  4:06 AM   Specimen: Anterior Nasal Swab  Result Value Ref Range Status   SARS Coronavirus 2 by RT PCR NEGATIVE NEGATIVE Final    Comment: (NOTE) SARS-CoV-2 target nucleic acids are NOT DETECTED.  The SARS-CoV-2 RNA is generally detectable in upper respiratory specimens during the acute phase of infection. The lowest concentration of SARS-CoV-2 viral copies this assay can detect is 138 copies/mL. A negative result does not preclude  SARS-Cov-2 infection and should not be used as the sole basis for treatment or other patient management decisions. A negative result may occur with  improper specimen collection/handling, submission of specimen other than nasopharyngeal swab, presence of viral mutation(s) within the areas targeted by this assay, and inadequate number of viral copies(<138 copies/mL). A negative result must be combined with clinical observations, patient history, and epidemiological information. The expected result is Negative.  Fact Sheet for Patients:  BloggerCourse.com  Fact Sheet for Healthcare Providers:  SeriousBroker.it  This test is no t yet approved or cleared by the [place]  FDA and  has been authorized for detection and/or diagnosis of SARS-CoV-2 by FDA under an Emergency Use Authorization (EUA). This EUA will remain  in effect (meaning this test can be used) for the duration of the COVID-19 declaration under Section 564(b)(1) of the Act, 21 U.S.C.section 360bbb-3(b)(1), unless the authorization is terminated  or revoked sooner.       Influenza A by PCR NEGATIVE NEGATIVE Final   Influenza B by PCR NEGATIVE NEGATIVE Final    Comment: (NOTE) The Xpert Xpress SARS-CoV-2/FLU/RSV plus assay is intended as an aid in the diagnosis of influenza from Nasopharyngeal swab specimens and should not be used as a sole basis for treatment. Nasal washings and aspirates are unacceptable for Xpert Xpress SARS-CoV-2/FLU/RSV testing.  Fact Sheet for Patients: BloggerCourse.com  Fact Sheet for Healthcare Providers: SeriousBroker.it  This test is not yet approved or cleared by the [place]  FDA and has been authorized for detection and/or diagnosis of SARS-CoV-2 by FDA under an Emergency Use Authorization (EUA). This EUA will remain in effect (meaning this test can be used) for the duration of  the COVID-19 declaration under Section 564(b)(1) of the Act, 21 U.S.C. section 360bbb-3(b)(1), unless the authorization is terminated or revoked.  Performed at [facility], [address], [place], [place] [address]     Coagulation Studies: No results for input(s): "LABPROT", "INR" in the last 72 hours.  Urinalysis: No results for input(s): "COLORURINE", "LABSPEC", "PHURINE", "GLUCOSEU", "HGBUR", "BILIRUBINUR", "KETONESUR", "PROTEINUR", "UROBILINOGEN", "NITRITE", "LEUKOCYTESUR" in the last 72 hours.  Invalid input(s): "APPERANCEUR"    Imaging: PERIPHERAL VASCULAR CATHETERIZATION Result Date: 04/04/2024 See surgical note for result.  DG Chest 1 View Result Date: 04/04/2024 CLINICAL DATA:  Congestive heart failure. EXAM: CHEST  1 VIEW COMPARISON:  02/29/2024 FINDINGS: Shallow inspiration. Cardiac enlargement. No vascular congestion, edema, or consolidation. No pleural effusion or pneumothorax. Mediastinal contours appear intact. Degenerative changes in the spine and shoulders. IMPRESSION: Cardiac enlargement.  Lungs are clear. Electronically Signed   By: Boyce Byes M.D.   On: 04/04/2024 15:05     Medications:     Chlorhexidine  Gluconate Cloth  6 each Topical Q0600   insulin  aspart  5 Units Intravenous Once   And   dextrose   1 ampule Intravenous Once   heparin   5,000 Units Subcutaneous Q12H   insulin  aspart  0-6 Units Subcutaneous TID WC   LORazepam   1 mg Intravenous Once   sertraline   100 mg Oral Daily   sevelamer  carbonate  1,600 mg Oral TID WC   sodium chloride  flush  3 mL Intravenous Q12H   acetaminophen , naproxen , ondansetron  (ZOFRAN ) IV, mouth rinse, sodium chloride  flush  Assessment/ Plan:  Mr. LOURDES OSEN is a 63 y.o.  male past medical conditions including hypertension, diabetes, end-stage renal disease on hemodialysis.  Patient presents to the emergency department with malfunctioning dialysis access and has been admitted under observation for End  stage renal disease (HCC) [N18.6] Hyperkalemia [E87.5] ESRD (end stage renal disease) on dialysis (HCC) [N18.6, Z99.2]  [facility]/TTS/left aVF  Malfunctioning dialysis access.  AV fistula concerns all week with outpatient dialysis.  Experienced infiltration on Thursday morning.  Complains of discomfort when cannulated by hospital staff.  Vascular surgery consulted, appreciate placement of HD temp cath.  Awaiting plan to evaluate fistula versus tunneled access placement  2.  End-stage renal disease with hyperkalemia on hemodialysis.  Last treatment received on Friday.  HD temp cath placed by vascular on 5/1.  Patient returned to room this morning due to aggressive behavior.  Patient given IV lorazepam  1 mg prior to treatment.  Potassium 6.6, temporizing measures given this morning.  Will order pretreatment potassium level and correct accordingly with appropriate bath.  3. Anemia of chronic kidney disease Lab Results  Component Value Date   HGB 12.0 (L) 04/05/2024    Hemoglobin within optimal range.  No need for ESA's at this time.  4. Diabetes mellitus type II with chronic kidney disease/renal manifestations: insulin  dependent. Home regimen includes NovoLog . Most recent hemoglobin A1c is 7.0 on 4/24.     LOS: 0 Johan Creveling 5/2/202512:13 PM

## 2024-04-05 NOTE — Care Management Obs Status (Signed)
 MEDICARE OBSERVATION STATUS NOTIFICATION   Patient Details  Name: Brandon [name] MRN: [id number] Date of Birth: 1961-02-14   Medicare Observation Status Notification Given:  Rudolph Cost, CMA 04/05/2024, 10:12 AM

## 2024-04-05 NOTE — Progress Notes (Signed)
 PROGRESS NOTE    Brandon [name]  ZOX:[id number] DOB: [birth year] DOA: 04/04/2024 PCP: Laneta Pintos, MD  Chief Complaint  Patient presents with   Vascular Access Problem    Hospital Course:  Brandon [name] is a 63 year old male with ESRD on hemodialysis MWF, insulin -dependent diabetes, hypertension, with recent malfunction of AV fistula.  Patient was hospitalized at [facility], discharged on 4/26 due to malfunctioning left arm AV fistula however workup revealed no significant stenosis and patient was able to complete inpatient hemodialysis.  On Monday 4/30 patient attempted outpatient hemodialysis and staff reports left arm AV fistula was not functioning.  Patient attempted dialysis again on 5/1 but again fistula was not working.  Patient was instead sent to the ED.  On arrival to the ED vital signs are largely within normal limits, BUN 129, creatinine 19, K6.1, no acute T wave changes on EKG.  Patient was given hyperkalemia cocktail and nephrology was consulted. Per 4/25 left upper extremity angiogram AV fistula is patent but there were 2 large branch vessels coming off the left cephalic vein which could be causing competitive flow.  Vascular surgery was consulted on this admission.  Plans for permacath but due to severely elevated BUN patient was only able to receive a temporary catheter on 5/1. Stay has been further complicated by behavioral problems.  Patient was refusing labs, EKG, and becoming aggressive with the staff  Subjective: Patient was meant to go for hemodialysis this morning but became aggressive with dialysis nurse and was sent back to the floor. The time of my evaluation patient is much calmer and going for dialysis now.  Endorses no acute complaints.   Objective: Vitals:   04/05/24 0642 04/05/24 0650 04/05/24 0704 04/05/24 0739  BP:  (!) 165/76  (!) 146/64  Pulse:  73  66  Resp: (!) 4 16 (!) 9 14  Temp:  98.3 F (36.8 C)  98.6 F (37 C)  TempSrc:  Oral    SpO2:  100%   100%  Weight:      Height:        Intake/Output Summary (Last 24 hours) at 04/05/2024 0913 Last data filed at 04/04/2024 2200 Gross per 24 hour  Intake 240 ml  Output --  Net 240 ml   Filed Weights   04/04/24 0815 04/04/24 1506  Weight: 100.5 kg 100.5 kg    Examination: General exam: Appears calm and comfortable, NAD  Respiratory system: No work of breathing, symmetric chest wall expansion Cardiovascular system: S1 & S2 heard, RRR.  Gastrointestinal system: Abdomen is nondistended, soft and nontender.  Neuro: Alert and oriented. No focal neurological deficits. Extremities: Left AV fistula without erythema, swelling, oozing. Skin: No rashes, lesions Psychiatry: Demonstrates appropriate judgement and insight. Mood & affect appropriate for situation.   Assessment & Plan:  Principal Problem:   Hyperkalemia Active Problems:   Acute on chronic diastolic CHF (congestive heart failure) (HCC)   AV fistula occlusion, initial encounter (HCC)    ESRD on hemodialysis Malfunctioning AV fistula - Vascular surgery consulted for PermCath, received temporary catheter 5/1.  Will likely need PermCath early next week - Nephrology consulted, hemodialysis per nephro.  Has hyperkalemia and significantly elevated BUN. - Fistula workup thus far has revealed patent fistula.  Appreciate vascular recommendations  Aggressive behaviors - Complicating care.  Resolved now. - RN to call security for safety if needed  Severe hyperkalemia - Patient requires urgent hemodialysis.  Some complications given his behavioral issues was unable to receive dialysis as scheduled.  Going for dialysis now.  Will be corrected with dialysis - Trend CMP daily.  Correct as needed  Insulin -dependent diabetes - Sliding scale insulin , titrate as tolerated  Hypertension - Stable at this time.  Continue current meds  DVT prophylaxis: Heparin    Code Status: Full Code Disposition: Will admit to inpatient.  Patient is  pending permanent catheter placement and further resolution of electrolyte abnormalities.  BUN still very elevated.  Going for dialysis today  Consultants:    Procedures:  Temporary Cath placement 5/1  Antimicrobials:  Anti-infectives (From admission, onward)    None       Data Reviewed: I have personally reviewed following labs and imaging studies CBC: Recent Labs  Lab 03/30/24 0547 04/04/24 0847 04/05/24 0450  WBC 3.9* 4.1 5.6  NEUTROABS  --  2.0  --   HGB 10.9* 11.2* 12.0*  HCT 31.8* 33.2* 35.6*  MCV 91.9 95.1 93.4  PLT 198 214 206   Basic Metabolic Panel: Recent Labs  Lab 03/30/24 0547 04/04/24 0847 04/04/24 2215 04/05/24 0450  NA 136 140  --  136  K 4.4 6.1* 6.4* 6.6*  CL 97* 99  --  96*  CO2 23 23  --  19*  GLUCOSE 103* 127*  --  129*  BUN 68* 124*  --  119*  CREATININE 11.86* 19.53*  --  20.57*  CALCIUM  7.5* 7.0*  --  7.0*  MG 2.0  --   --   --    GFR: Estimated Creatinine Clearance: 4.7 mL/min (A) (by C-G formula based on SCr of 20.57 mg/dL (H)). Liver Function Tests: Recent Labs  Lab 03/30/24 0547  AST 6*  ALT 9  ALKPHOS 58  BILITOT 1.0  PROT 6.8  ALBUMIN 3.3*   CBG: Recent Labs  Lab 04/04/24 1540 04/04/24 1707 04/04/24 2058 04/05/24 0101 04/05/24 0736  GLUCAP 104* 113* 157* 124* 113*    No results found for this or any previous visit (from the past 240 hours).   Radiology Studies: PERIPHERAL VASCULAR CATHETERIZATION Result Date: 04/04/2024 See surgical note for result.  DG Chest 1 View Result Date: 04/04/2024 CLINICAL DATA:  Congestive heart failure. EXAM: CHEST  1 VIEW COMPARISON:  02/29/2024 FINDINGS: Shallow inspiration. Cardiac enlargement. No vascular congestion, edema, or consolidation. No pleural effusion or pneumothorax. Mediastinal contours appear intact. Degenerative changes in the spine and shoulders. IMPRESSION: Cardiac enlargement.  Lungs are clear. Electronically Signed   By: Boyce Byes M.D.   On: 04/04/2024  15:05    Scheduled Meds:  Chlorhexidine  Gluconate Cloth  6 each Topical Q0600   insulin  aspart  5 Units Intravenous Once   And   dextrose   1 ampule Intravenous Once   heparin   5,000 Units Subcutaneous Q12H   insulin  aspart  0-6 Units Subcutaneous TID WC   LORazepam   1 mg Intravenous Once   sertraline   100 mg Oral Daily   sevelamer  carbonate  1,600 mg Oral TID WC   sodium chloride  flush  3 mL Intravenous Q12H   Continuous Infusions:  sodium chloride      calcium  gluconate       LOS: 0 days  MDM: Patient is high risk for one or more organ failure.  They necessitate ongoing hospitalization for continued IV therapies and subsequent lab monitoring. Total time spent interpreting labs and vitals, reviewing the medical record, coordinating care amongst consultants and care team members, directly assessing and discussing care with the patient and/or family: 55 min  Darivs Lunden, DO [facility]  To  contact the attending physician between 7A-7P please use Epic Chat. To contact the covering physician during after hours 7P-7A, please review Amion.  04/05/2024, 9:13 AM   *This document has been created with the assistance of dictation software. Please excuse typographical errors. *

## 2024-04-05 NOTE — Progress Notes (Signed)
 Hemodialysis Note:  Received patient in bed to unit. Alert and oriented. Informed consent singed and in chart.  Treatment initiated: 1150 Treatment completed: 1600  Access used: Right femoral catheter Access issues: None  Patient tolerated well. Transported back to room, alert without acute distress. Report given to patient's RN.  Total UF removed: 1.5 Liters Medications given: None  Post HD weight: 104.3 Kg  [facility]

## 2024-04-05 NOTE — Plan of Care (Signed)
  Problem: Health Behavior/Discharge Planning: Goal: Ability to identify and utilize available resources and services will improve Outcome: Progressing   Problem: Skin Integrity: Goal: Risk for impaired skin integrity will decrease Outcome: Progressing   Problem: Activity: Goal: Risk for activity intolerance will decrease Outcome: Progressing   Problem: Safety: Goal: Ability to remain free from injury will improve Outcome: Progressing   Problem: Skin Integrity: Goal: Risk for impaired skin integrity will decrease Outcome: Progressing

## 2024-04-05 NOTE — Progress Notes (Signed)
 Elisabeth Guild, NP and Dr. Rhesa Celeste of nephrology team notified of patient's elevated potassium levels this morning. Orders entered.

## 2024-04-05 NOTE — Tx Team (Signed)
 Patient refused STAT EKG ordered due to critical potassium of 6.4. Elisabeth Guild, NP, notified of refusal.

## 2024-04-05 NOTE — Progress Notes (Signed)
 Pt down to unit for tx, when asked if pt would sign consent, pt became agitated.  I asked if anything was wrong and he continued to give me an attitude. Telling me "just get going doing what your going to do"  I asked pt if there was anything I needed to do for him and he sat up in his bed started getting verbally abusive telling me to "go fuck yourself"  I told the pt to stop cursing and not to say that and he continued to tell me "fuck you" on repeat.  He then started to get out of his bed to come at me.  I told him we aren't doing that today and to lay back down.  He started again to get out of his bed again.  RN then came over and I left pt,  called NP and made aware of above.  Orders given to send pt back upstairs until they can talk to him.

## 2024-04-05 NOTE — Progress Notes (Signed)
 Heart Failure Navigator Progress Note  Assessed for Heart & Vascular TOC clinic readiness.  Unfortunately patient does not meet criteria due to ESRD on Hemodialysis.   Navigator will sign off at this time.  Celedonio Coil, RN, BSN [facility] Heart Failure Navigator Secure Chat Only

## 2024-04-06 DIAGNOSIS — T82898A Other specified complication of vascular prosthetic devices, implants and grafts, initial encounter: Secondary | ICD-10-CM | POA: Diagnosis not present

## 2024-04-06 DIAGNOSIS — I5033 Acute on chronic diastolic (congestive) heart failure: Secondary | ICD-10-CM | POA: Diagnosis not present

## 2024-04-06 DIAGNOSIS — E875 Hyperkalemia: Secondary | ICD-10-CM | POA: Diagnosis not present

## 2024-04-06 LAB — CBC WITH DIFFERENTIAL/PLATELET
Abs Immature Granulocytes: 0.02 10*3/uL (ref 0.00–0.07)
Basophils Absolute: 0 10*3/uL (ref 0.0–0.1)
Basophils Relative: 1 %
Eosinophils Absolute: 0.1 10*3/uL (ref 0.0–0.5)
Eosinophils Relative: 4 %
HCT: 29.1 % — ABNORMAL LOW (ref 39.0–52.0)
Hemoglobin: 9.8 g/dL — ABNORMAL LOW (ref 13.0–17.0)
Immature Granulocytes: 1 %
Lymphocytes Relative: 39 %
Lymphs Abs: 1.4 10*3/uL (ref 0.7–4.0)
MCH: 31.3 pg (ref 26.0–34.0)
MCHC: 33.7 g/dL (ref 30.0–36.0)
MCV: 93 fL (ref 80.0–100.0)
Monocytes Absolute: 0.3 10*3/uL (ref 0.1–1.0)
Monocytes Relative: 9 %
Neutro Abs: 1.8 10*3/uL (ref 1.7–7.7)
Neutrophils Relative %: 46 %
Platelets: 163 10*3/uL (ref 150–400)
RBC: 3.13 MIL/uL — ABNORMAL LOW (ref 4.22–5.81)
RDW: 12.1 % (ref 11.5–15.5)
WBC: 3.7 10*3/uL — ABNORMAL LOW (ref 4.0–10.5)
nRBC: 0 % (ref 0.0–0.2)

## 2024-04-06 LAB — COMPREHENSIVE METABOLIC PANEL WITH GFR
ALT: 8 U/L (ref 0–44)
AST: 8 U/L — ABNORMAL LOW (ref 15–41)
Albumin: 3.4 g/dL — ABNORMAL LOW (ref 3.5–5.0)
Alkaline Phosphatase: 63 U/L (ref 38–126)
Anion gap: 13 (ref 5–15)
BUN: 67 mg/dL — ABNORMAL HIGH (ref 6–20)
CO2: 26 mmol/L (ref 22–32)
Calcium: 7 mg/dL — ABNORMAL LOW (ref 8.9–10.3)
Chloride: 95 mmol/L — ABNORMAL LOW (ref 98–111)
Creatinine, Ser: 13.38 mg/dL — ABNORMAL HIGH (ref 0.61–1.24)
GFR, Estimated: 4 mL/min — ABNORMAL LOW (ref >60)
Glucose, Bld: 114 mg/dL — ABNORMAL HIGH (ref 70–99)
Potassium: 4.6 mmol/L (ref 3.5–5.1)
Sodium: 134 mmol/L — ABNORMAL LOW (ref 135–145)
Total Bilirubin: 0.9 mg/dL (ref 0.0–1.2)
Total Protein: 6.6 g/dL (ref 6.5–8.1)

## 2024-04-06 LAB — GLUCOSE, CAPILLARY
Glucose-Capillary: 118 mg/dL — ABNORMAL HIGH (ref 70–99)
Glucose-Capillary: 102 mg/dL — ABNORMAL HIGH (ref 70–99)
Glucose-Capillary: 131 mg/dL — ABNORMAL HIGH (ref 70–99)

## 2024-04-06 LAB — PHOSPHORUS: Phosphorus: 6.4 mg/dL — ABNORMAL HIGH (ref 2.5–4.6)

## 2024-04-06 LAB — MAGNESIUM: Magnesium: 2.1 mg/dL (ref 1.7–2.4)

## 2024-04-06 NOTE — Plan of Care (Signed)
  Problem: Education: Goal: Ability to describe self-care measures that may prevent or decrease complications (Diabetes Survival Skills Education) will improve Outcome: Progressing   Problem: Coping: Goal: Ability to adjust to condition or change in health will improve Outcome: Progressing   Problem: Health Behavior/Discharge Planning: Goal: Ability to manage health-related needs will improve Outcome: Progressing   

## 2024-04-06 NOTE — Progress Notes (Signed)
 [facility]  ROUNDING NOTE   Subjective:   Brandon [name] is a 63 year old male patient known to our practice.  Currently receives outpatient dialysis treatments at [facility] on a TTS schedule, supervised by Dr. Rhesa Celeste.  Patient presents to the emergency department with a malfunctioning dialysis access.  Patient's last dialysis treatment was received last Friday.  Patient seen and evaluated during dialysis   HEMODIALYSIS FLOWSHEET:  Blood Flow Rate (mL/min): 300 mL/min Arterial Pressure (mmHg): -174.74 mmHg Venous Pressure (mmHg): 122.82 mmHg TMP (mmHg): 24.84 mmHg Ultrafiltration Rate (mL/min): 971 mL/min Dialysate Flow Rate (mL/min): 300 ml/min  Resting quietly Patient did receive Ativan  prior to treatment.   Objective:  Vital signs in last 24 hours:  Temp:  [97.9 F (36.6 C)-98.6 F (37 C)] 98.6 F (37 C) (05/03 0950) Pulse Rate:  [68-76] 76 (05/03 1100) Resp:  [0-22] 13 (05/03 1100) BP: (132-174)/(72-91) 139/87 (05/03 1100) SpO2:  [100 %] 100 % (05/03 1100) Weight:  [101.7 kg-105.8 kg] 101.7 kg (05/03 0950)  Weight change: 5.3 kg Filed Weights   04/05/24 1138 04/05/24 1600 04/06/24 0950  Weight: 105.8 kg 104.3 kg 101.7 kg    Intake/Output: I/O last 3 completed shifts: In: 243 [P.O.:240; I.V.:3] Out: 1500 [Other:1500]   Intake/Output this shift:  No intake/output data recorded.  Physical Exam: General: NAD,   Head: Normocephalic, atraumatic. Moist oral mucosal membranes  Eyes: Anicteric  Lungs:  Clear to auscultation  Heart: Regular rate and rhythm  Abdomen:  Soft, nontender, nondistended  Extremities: Trace peripheral edema.  Neurologic: Alert and oriented, moving all four extremities  Skin: No lesions  Access: Left aVF, right femoral HD temp cath    Basic Metabolic Panel: Recent Labs  Lab 04/04/24 0847 04/04/24 2215 04/05/24 0450 04/05/24 1148 04/06/24 0351  NA 140  --  136 135 134*  K 6.1* 6.4* 6.6* 6.4* 4.6  CL  99  --  96* 95* 95*  CO2 23  --  19* 20* 26  GLUCOSE 127*  --  129* 183* 114*  BUN 124*  --  119* 117* 67*  CREATININE 19.53*  --  20.57* 20.74* 13.38*  CALCIUM  7.0*  --  7.0* 6.5* 7.0*  MG  --   --   --   --  2.1  PHOS  --   --   --  8.8* 6.4*    Liver Function Tests: Recent Labs  Lab 04/05/24 1148 04/06/24 0351  AST  --  8*  ALT  --  8  ALKPHOS  --  63  BILITOT  --  0.9  PROT  --  6.6  ALBUMIN 3.3* 3.4*   No results for input(s): "LIPASE", "AMYLASE" in the last 168 hours. No results for input(s): "AMMONIA" in the last 168 hours.  CBC: Recent Labs  Lab 04/04/24 0847 04/05/24 0450 04/06/24 0351  WBC 4.1 5.6 3.7*  NEUTROABS 2.0  --  1.8  HGB 11.2* 12.0* 9.8*  HCT 33.2* 35.6* 29.1*  MCV 95.1 93.4 93.0  PLT 214 206 163    Cardiac Enzymes: No results for input(s): "CKTOTAL", "CKMB", "CKMBINDEX", "TROPONINI" in the last 168 hours.  BNP: Invalid input(s): "POCBNP"  CBG: Recent Labs  Lab 04/05/24 0926 04/05/24 1737 04/05/24 1852 04/05/24 2214 04/06/24 0730  GLUCAP 97 86 85 197* 118*    Microbiology: Results for orders placed or performed during the hospital encounter of 10/02/22  Resp Panel by RT-PCR (Flu A&B, Covid) Anterior Nasal Swab     Status: None  Collection Time: 10/02/22  4:06 AM   Specimen: Anterior Nasal Swab  Result Value Ref Range Status   SARS Coronavirus 2 by RT PCR NEGATIVE NEGATIVE Final    Comment: (NOTE) SARS-CoV-2 target nucleic acids are NOT DETECTED.  The SARS-CoV-2 RNA is generally detectable in upper respiratory specimens during the acute phase of infection. The lowest concentration of SARS-CoV-2 viral copies this assay can detect is 138 copies/mL. A negative result does not preclude SARS-Cov-2 infection and should not be used as the sole basis for treatment or other patient management decisions. A negative result may occur with  improper specimen collection/handling, submission of specimen other than nasopharyngeal swab,  presence of viral mutation(s) within the areas targeted by this assay, and inadequate number of viral copies(<138 copies/mL). A negative result must be combined with clinical observations, patient history, and epidemiological information. The expected result is Negative.  Fact Sheet for Patients:  BloggerCourse.com  Fact Sheet for Healthcare Providers:  SeriousBroker.it  This test is no t yet approved or cleared by the [place]  FDA and  has been authorized for detection and/or diagnosis of SARS-CoV-2 by FDA under an Emergency Use Authorization (EUA). This EUA will remain  in effect (meaning this test can be used) for the duration of the COVID-19 declaration under Section 564(b)(1) of the Act, 21 U.S.C.section 360bbb-3(b)(1), unless the authorization is terminated  or revoked sooner.       Influenza A by PCR NEGATIVE NEGATIVE Final   Influenza B by PCR NEGATIVE NEGATIVE Final    Comment: (NOTE) The Xpert Xpress SARS-CoV-2/FLU/RSV plus assay is intended as an aid in the diagnosis of influenza from Nasopharyngeal swab specimens and should not be used as a sole basis for treatment. Nasal washings and aspirates are unacceptable for Xpert Xpress SARS-CoV-2/FLU/RSV testing.  Fact Sheet for Patients: BloggerCourse.com  Fact Sheet for Healthcare Providers: SeriousBroker.it  This test is not yet approved or cleared by the [place]  FDA and has been authorized for detection and/or diagnosis of SARS-CoV-2 by FDA under an Emergency Use Authorization (EUA). This EUA will remain in effect (meaning this test can be used) for the duration of the COVID-19 declaration under Section 564(b)(1) of the Act, 21 U.S.C. section 360bbb-3(b)(1), unless the authorization is terminated or revoked.  Performed at [facility], [address], [place], [place] [address]     Coagulation  Studies: No results for input(s): "LABPROT", "INR" in the last 72 hours.  Urinalysis: No results for input(s): "COLORURINE", "LABSPEC", "PHURINE", "GLUCOSEU", "HGBUR", "BILIRUBINUR", "KETONESUR", "PROTEINUR", "UROBILINOGEN", "NITRITE", "LEUKOCYTESUR" in the last 72 hours.  Invalid input(s): "APPERANCEUR"    Imaging: PERIPHERAL VASCULAR CATHETERIZATION Result Date: 04/04/2024 See surgical note for result.  DG Chest 1 View Result Date: 04/04/2024 CLINICAL DATA:  Congestive heart failure. EXAM: CHEST  1 VIEW COMPARISON:  02/29/2024 FINDINGS: Shallow inspiration. Cardiac enlargement. No vascular congestion, edema, or consolidation. No pleural effusion or pneumothorax. Mediastinal contours appear intact. Degenerative changes in the spine and shoulders. IMPRESSION: Cardiac enlargement.  Lungs are clear. Electronically Signed   By: Boyce Byes M.D.   On: 04/04/2024 15:05     Medications:     Chlorhexidine  Gluconate Cloth  6 each Topical Q0600   insulin  aspart  5 Units Intravenous Once   And   dextrose   1 ampule Intravenous Once   heparin   5,000 Units Subcutaneous Q12H   insulin  aspart  0-6 Units Subcutaneous TID WC   LORazepam   1 mg Intravenous Once   sertraline   100 mg  Oral Daily   sevelamer  carbonate  1,600 mg Oral TID WC   sodium chloride  flush  3 mL Intravenous Q12H   acetaminophen , naproxen , ondansetron  (ZOFRAN ) IV, mouth rinse, sodium chloride  flush  Assessment/ Plan:  [name] is a 63 y.o.  male past medical conditions including hypertension, diabetes, end-stage renal disease on hemodialysis.  Patient presents to the emergency department with malfunctioning dialysis access and has been admitted under observation for End stage renal disease (HCC) [N18.6] Hyperkalemia [E87.5] ESRD (end stage renal disease) on dialysis (HCC) [N18.6, Z99.2]  [facility]/TTS/left aVF  Malfunctioning dialysis access.  AV fistula concerns all week with outpatient dialysis  with infiltration.  Patient now has HD temp cath and awaiting vascular treatment plan.  2.  End-stage renal disease with hyperkalemia on hemodialysis.  Patient received dialysis yesterday, UF 1.5 L as tolerated.  Potassium was corrected to 4.6.  Patient receiving dialysis today to maintain outpatient schedule.  3. Anemia of chronic kidney disease Lab Results  Component Value Date   HGB 9.8 (L) 04/06/2024    Hemoglobin satisfactory.  No need for ESA's at this time.  4. Diabetes mellitus type II with chronic kidney disease/renal manifestations: insulin  dependent. Home regimen includes NovoLog . Most recent hemoglobin A1c is 7.0 on 4/24.   Glucose well-controlled.   LOS: 1 Roshawnda Pecora 5/3/202511:33 AM

## 2024-04-06 NOTE — Progress Notes (Signed)
 PROGRESS NOTE    Brandon [name]  ZOX:[id number] DOB: 07/20/1961 DOA: 04/04/2024 PCP: Laneta Pintos, MD  Chief Complaint  Patient presents with   Vascular Access Problem    Hospital Course:  Brandon [name] is a 63 year old male with ESRD on hemodialysis MWF, insulin -dependent diabetes, hypertension, with recent malfunction of AV fistula.  Patient was hospitalized at [facility], discharged on 4/26 due to malfunctioning left arm AV fistula however workup revealed no significant stenosis and patient was able to complete inpatient hemodialysis.  On Monday 4/30 patient attempted outpatient hemodialysis and staff reports left arm AV fistula was not functioning.  Patient attempted dialysis again on 5/1 but again fistula was not working.  Patient was instead sent to the ED.  On arrival to the ED vital signs are largely within normal limits, BUN 129, creatinine 19, K6.1, no acute T wave changes on EKG.  Patient was given hyperkalemia cocktail and nephrology was consulted. Per 4/25 left upper extremity angiogram AV fistula is patent but there were 2 large branch vessels coming off the left cephalic vein which could be causing competitive flow.  Vascular surgery was consulted on this admission.  Plans for permacath but due to severely elevated BUN patient was only able to receive a temporary catheter on 5/1. Stay has been further complicated by behavioral problems.  Patient was refusing labs, EKG, and becoming aggressive with the staff  Subjective: Patient evaluated while receiving hemodialysis.  He has no acute complaints this morning.   Objective: Vitals:   04/05/24 2022 04/05/24 2313 04/06/24 0442 04/06/24 0728  BP: 132/76 (!) 152/77 (!) 144/72 (!) 147/83  Pulse: 75 69 70 69  Resp: 18 16 18 18   Temp: 98.3 F (36.8 C) 98.2 F (36.8 C) 98.6 F (37 C)   TempSrc: Oral     SpO2: 100% 100% 100% 100%  Weight:      Height:        Intake/Output Summary (Last 24 hours) at 04/06/2024 0830 Last data  filed at 04/05/2024 2134 Gross per 24 hour  Intake 3 ml  Output 1500 ml  Net -1497 ml   Filed Weights   04/04/24 1506 04/05/24 1138 04/05/24 1600  Weight: 100.5 kg 105.8 kg 104.3 kg    Examination: General exam: Appears calm and comfortable, NAD  Respiratory system: No work of breathing, symmetric chest wall expansion Cardiovascular system: S1 & S2 heard, RRR.  Gastrointestinal system: Abdomen is nondistended, soft and nontender.  Neuro: Alert and oriented. No focal neurological deficits. Extremities: Left AV fistula without erythema, swelling, oozing. Skin: No rashes, lesions Psychiatry: Demonstrates appropriate judgement and insight. Mood & affect appropriate for situation.   Assessment & Plan:  Principal Problem:   Hyperkalemia Active Problems:   Acute on chronic diastolic CHF (congestive heart failure) (HCC)   AV fistula occlusion, initial encounter (HCC)    ESRD on hemodialysis Malfunctioning AV fistula - Vascular surgery consulted for PermCath, received temporary catheter 5/1.  Will likely need PermCath early next week, pending final plans - Nephrology consulted, hemodialysis per nephrology recommendations.  Currently well-tolerated - BUN and potassium have resolved nicely.  Will continue to trend. - Fistula workup thus far has revealed patent fistula though there are 2 of branching large vessels which could be causing competitive flow.  Appreciate vascular recommendations  Aggressive behaviors - Complicating care.  Resolved now. - RN to call security for safety if needed  Severe hyperkalemia - Received shifters.  Improved now after dialysis - Continue to trend daily CMP, correct  as needed - Continue on telemetry monitoring, no acute arrhythmias  Insulin -dependent diabetes - Sliding scale insulin , titrate as tolerated  Hypertension - Stable at this time.  Continue current meds  DVT prophylaxis: Heparin    Code Status: Full Code Disposition:  Consultants:     Procedures:  Temporary Cath placement 5/1  Antimicrobials:  Anti-infectives (From admission, onward)    None       Data Reviewed: I have personally reviewed following labs and imaging studies CBC: Recent Labs  Lab 04/04/24 0847 04/05/24 0450 04/06/24 0351  WBC 4.1 5.6 3.7*  NEUTROABS 2.0  --  1.8  HGB 11.2* 12.0* 9.8*  HCT 33.2* 35.6* 29.1*  MCV 95.1 93.4 93.0  PLT 214 206 163   Basic Metabolic Panel: Recent Labs  Lab 04/04/24 0847 04/04/24 2215 04/05/24 0450 04/05/24 1148 04/06/24 0351  NA 140  --  136 135 134*  K 6.1* 6.4* 6.6* 6.4* 4.6  CL 99  --  96* 95* 95*  CO2 23  --  19* 20* 26  GLUCOSE 127*  --  129* 183* 114*  BUN 124*  --  119* 117* 67*  CREATININE 19.53*  --  20.57* 20.74* 13.38*  CALCIUM  7.0*  --  7.0* 6.5* 7.0*  MG  --   --   --   --  2.1  PHOS  --   --   --  8.8* 6.4*   GFR: Estimated Creatinine Clearance: 7.3 mL/min (A) (by C-G formula based on SCr of 13.38 mg/dL (H)). Liver Function Tests: Recent Labs  Lab 04/05/24 1148 04/06/24 0351  AST  --  8*  ALT  --  8  ALKPHOS  --  63  BILITOT  --  0.9  PROT  --  6.6  ALBUMIN 3.3* 3.4*   CBG: Recent Labs  Lab 04/05/24 0926 04/05/24 1737 04/05/24 1852 04/05/24 2214 04/06/24 0730  GLUCAP 97 86 85 197* 118*    No results found for this or any previous visit (from the past 240 hours).   Radiology Studies: PERIPHERAL VASCULAR CATHETERIZATION Result Date: 04/04/2024 See surgical note for result.  DG Chest 1 View Result Date: 04/04/2024 CLINICAL DATA:  Congestive heart failure. EXAM: CHEST  1 VIEW COMPARISON:  02/29/2024 FINDINGS: Shallow inspiration. Cardiac enlargement. No vascular congestion, edema, or consolidation. No pleural effusion or pneumothorax. Mediastinal contours appear intact. Degenerative changes in the spine and shoulders. IMPRESSION: Cardiac enlargement.  Lungs are clear. Electronically Signed   By: Boyce Byes M.D.   On: 04/04/2024 15:05    Scheduled Meds:   Chlorhexidine  Gluconate Cloth  6 each Topical Q0600   insulin  aspart  5 Units Intravenous Once   And   dextrose   1 ampule Intravenous Once   heparin   5,000 Units Subcutaneous Q12H   insulin  aspart  0-6 Units Subcutaneous TID WC   LORazepam   1 mg Intravenous Once   LORazepam   1 mg Intravenous Once   sertraline   100 mg Oral Daily   sevelamer  carbonate  1,600 mg Oral TID WC   sodium chloride  flush  3 mL Intravenous Q12H   Continuous Infusions:     LOS: 1 day  MDM: Patient is high risk for one or more organ failure.  They necessitate ongoing hospitalization for continued IV therapies and subsequent lab monitoring. Total time spent interpreting labs and vitals, reviewing the medical record, coordinating care amongst consultants and care team members, directly assessing and discussing care with the patient and/or family: 55 min  Javon Snee, DO  [facility]  To contact the attending physician between 7A-7P please use Epic Chat. To contact the covering physician during after hours 7P-7A, please review Amion.  04/06/2024, 8:30 AM   *This document has been created with the assistance of dictation software. Please excuse typographical errors. *

## 2024-04-06 NOTE — Progress Notes (Signed)
 Pt transferred from Room 111 to the Hemodialysis Unit via hospital bed by the transporter

## 2024-04-06 NOTE — Progress Notes (Signed)
   04/06/24 1345  Vitals  Temp 97.6 F (36.4 C)  Temp Source Oral  BP 119/69  MAP (mmHg) 82  BP Location Right Wrist  BP Method Automatic  Patient Position (if appropriate) Lying  Pulse Rate 80  Pulse Rate Source Monitor  ECG Heart Rate 80  Resp 14  Oxygen Therapy  SpO2 100 %  O2 Device Room Air  During Treatment Monitoring  Blood Flow Rate (mL/min) 0 mL/min  Arterial Pressure (mmHg) -2.63 mmHg  Venous Pressure (mmHg) -2.42 mmHg  TMP (mmHg) -54.34 mmHg  Ultrafiltration Rate (mL/min) 0 mL/min  Dialysate Flow Rate (mL/min) 299 ml/min  Duration of HD Treatment -hour(s) 3.5 hour(s)  Cumulative Fluid Removed (mL) per Treatment  2000.54  Post Treatment  Dialyzer Clearance Lightly streaked  Hemodialysis Intake (mL) 0 mL  Liters Processed 68.4  Fluid Removed (mL) 2000 mL  Tolerated HD Treatment Yes  Post-Hemodialysis Comments tolerated well no problems to note  Note  Patient Observations no issues no c/o tolerated well  Hemodialysis Catheter Right Femoral vein Triple lumen Temporary (Non-Tunneled)  Placement Date/Time: 04/04/24 1608   Serial / Lot #: [id number]  Expiration Date: 05/04/25  Time Out: Correct patient;Correct site;Correct procedure  Maximum sterile barrier precautions: Hand hygiene;Cap;Mask;Sterile gown;Sterile gloves;Sterile probe co...  Site Condition No complications  Blue Lumen Status Flushed;Heparin  locked  Red Lumen Status Flushed;Heparin  locked  Catheter fill solution Heparin  1000 units/ml  Catheter fill volume (Arterial) 1.8 cc  Catheter fill volume (Venous) 1.8  Dressing Type Transparent  Dressing Status Antimicrobial disc/dressing in place  Drainage Description None  Post treatment catheter status Capped and Clamped   Received patient in bed to unit.  Alert and oriented.  Informed consent signed and in chart.   TX duration:3.5  Patient tolerated well.  Transported back to the room  Alert, without acute distress.  Hand-off given to patient's nurse.    Access used: Rt femoral hd cath Access issues: alarming at times  Total UF removed: 2000 Medication(s) given: none Post HD VS: see above Post HD weight: 99.5kg   [facility]

## 2024-04-06 NOTE — Plan of Care (Signed)
  Problem: Coping: Goal: Ability to adjust to condition or change in health will improve Outcome: Progressing   Problem: Fluid Volume: Goal: Ability to maintain a balanced intake and output will improve Outcome: Progressing   Problem: Health Behavior/Discharge Planning: Goal: Ability to identify and utilize available resources and services will improve Outcome: Progressing Goal: Ability to manage health-related needs will improve Outcome: Progressing   Problem: Metabolic: Goal: Ability to maintain appropriate glucose levels will improve Outcome: Progressing   Problem: Nutritional: Goal: Maintenance of adequate nutrition will improve Outcome: Progressing Goal: Progress toward achieving an optimal weight will improve Outcome: Progressing   Problem: Skin Integrity: Goal: Risk for impaired skin integrity will decrease Outcome: Progressing   Problem: Tissue Perfusion: Goal: Adequacy of tissue perfusion will improve Outcome: Progressing   Problem: Education: Goal: Knowledge of General Education information will improve Description: Including pain rating scale, medication(s)/side effects and non-pharmacologic comfort measures Outcome: Progressing   Problem: Health Behavior/Discharge Planning: Goal: Ability to manage health-related needs will improve Outcome: Progressing   Problem: Clinical Measurements: Goal: Ability to maintain clinical measurements within normal limits will improve Outcome: Progressing Goal: Will remain free from infection Outcome: Progressing   Problem: Activity: Goal: Risk for activity intolerance will decrease Outcome: Progressing   Problem: Nutrition: Goal: Adequate nutrition will be maintained Outcome: Progressing   Problem: Coping: Goal: Level of anxiety will decrease Outcome: Progressing   Problem: Elimination: Goal: Will not experience complications related to bowel motility Outcome: Progressing Goal: Will not experience complications  related to urinary retention Outcome: Progressing  Hemodialysis pt - removed 2 liters in dialysis this shift per hemodialysis RN report Problem: Pain Managment: Goal: General experience of comfort will improve and/or be controlled Outcome: Progressing   Problem: Safety: Goal: Ability to remain free from injury will improve Outcome: Progressing   Problem: Skin Integrity: Goal: Risk for impaired skin integrity will decrease Outcome: Progressing   Problem: Education: Goal: Ability to describe self-care measures that may prevent or decrease complications (Diabetes Survival Skills Education) will improve Outcome: Not Applicable Goal: Individualized Educational Video(s) Outcome: Not Applicable   Problem: Clinical Measurements: Goal: Diagnostic test results will improve Outcome: Not Applicable Goal: Respiratory complications will improve Outcome: Not Applicable Goal: Cardiovascular complication will be avoided Outcome: Not Applicable

## 2024-04-07 DIAGNOSIS — I5033 Acute on chronic diastolic (congestive) heart failure: Secondary | ICD-10-CM | POA: Diagnosis not present

## 2024-04-07 DIAGNOSIS — T82898A Other specified complication of vascular prosthetic devices, implants and grafts, initial encounter: Secondary | ICD-10-CM | POA: Diagnosis not present

## 2024-04-07 DIAGNOSIS — E875 Hyperkalemia: Secondary | ICD-10-CM | POA: Diagnosis not present

## 2024-04-07 LAB — CBC WITH DIFFERENTIAL/PLATELET
Abs Immature Granulocytes: 0.01 10*3/uL (ref 0.00–0.07)
Basophils Absolute: 0 10*3/uL (ref 0.0–0.1)
Basophils Relative: 1 %
Eosinophils Absolute: 0.1 10*3/uL (ref 0.0–0.5)
Eosinophils Relative: 2 %
HCT: 29.3 % — ABNORMAL LOW (ref 39.0–52.0)
Hemoglobin: 10 g/dL — ABNORMAL LOW (ref 13.0–17.0)
Immature Granulocytes: 0 %
Lymphocytes Relative: 38 %
Lymphs Abs: 1.4 10*3/uL (ref 0.7–4.0)
MCH: 31.3 pg (ref 26.0–34.0)
MCHC: 34.1 g/dL (ref 30.0–36.0)
MCV: 91.8 fL (ref 80.0–100.0)
Monocytes Absolute: 0.3 10*3/uL (ref 0.1–1.0)
Monocytes Relative: 9 %
Neutro Abs: 1.9 10*3/uL (ref 1.7–7.7)
Neutrophils Relative %: 50 %
Platelets: 167 10*3/uL (ref 150–400)
RBC: 3.19 MIL/uL — ABNORMAL LOW (ref 4.22–5.81)
RDW: 12.1 % (ref 11.5–15.5)
WBC: 3.7 10*3/uL — ABNORMAL LOW (ref 4.0–10.5)
nRBC: 0 % (ref 0.0–0.2)

## 2024-04-07 LAB — COMPREHENSIVE METABOLIC PANEL WITH GFR
ALT: 8 U/L (ref 0–44)
AST: 10 U/L — ABNORMAL LOW (ref 15–41)
Albumin: 3.7 g/dL (ref 3.5–5.0)
Alkaline Phosphatase: 57 U/L (ref 38–126)
Anion gap: 16 — ABNORMAL HIGH (ref 5–15)
BUN: 43 mg/dL — ABNORMAL HIGH (ref 6–20)
CO2: 25 mmol/L (ref 22–32)
Calcium: 7.9 mg/dL — ABNORMAL LOW (ref 8.9–10.3)
Chloride: 94 mmol/L — ABNORMAL LOW (ref 98–111)
Creatinine, Ser: 9.56 mg/dL — ABNORMAL HIGH (ref 0.61–1.24)
GFR, Estimated: 6 mL/min — ABNORMAL LOW (ref >60)
Glucose, Bld: 103 mg/dL — ABNORMAL HIGH (ref 70–99)
Potassium: 4.5 mmol/L (ref 3.5–5.1)
Sodium: 135 mmol/L (ref 135–145)
Total Bilirubin: 1 mg/dL (ref 0.0–1.2)
Total Protein: 7.2 g/dL (ref 6.5–8.1)

## 2024-04-07 LAB — GLUCOSE, CAPILLARY
Glucose-Capillary: 107 mg/dL — ABNORMAL HIGH (ref 70–99)
Glucose-Capillary: 170 mg/dL — ABNORMAL HIGH (ref 70–99)
Glucose-Capillary: 145 mg/dL — ABNORMAL HIGH (ref 70–99)
Glucose-Capillary: 148 mg/dL — ABNORMAL HIGH (ref 70–99)

## 2024-04-07 LAB — MAGNESIUM: Magnesium: 2.1 mg/dL (ref 1.7–2.4)

## 2024-04-07 LAB — PHOSPHORUS: Phosphorus: 5.6 mg/dL — ABNORMAL HIGH (ref 2.5–4.6)

## 2024-04-07 NOTE — Progress Notes (Signed)
 [facility]  ROUNDING NOTE   Subjective:   Brandon [name] is a 63 year old male patient known to our practice.  Currently receives outpatient dialysis treatments at [facility] on a TTS schedule, supervised by Dr. Rhesa Celeste.  Patient presents to the emergency department with a malfunctioning dialysis access.  Patient laying quietly in bed Dialysis yesterday, UF 2L achieved.   Objective:  Vital signs in last 24 hours:  Temp:  [97.6 F (36.4 C)-99.3 F (37.4 C)] 98.5 F (36.9 C) (05/04 0943) Pulse Rate:  [71-84] 84 (05/04 0943) Resp:  [12-16] 16 (05/03 2013) BP: (96-145)/(38-87) 135/87 (05/04 0943) SpO2:  [98 %-100 %] 100 % (05/04 0943) Weight:  [99.5 kg] 99.5 kg (05/03 1345)  Weight change: -4.1 kg Filed Weights   04/05/24 1600 04/06/24 0950 04/06/24 1345  Weight: 104.3 kg 101.7 kg 99.5 kg    Intake/Output: I/O last 3 completed shifts: In: 3 [I.V.:3] Out: 2000 [Other:2000]   Intake/Output this shift:  No intake/output data recorded.  Physical Exam: General: NAD,   Head: Normocephalic, atraumatic. Moist oral mucosal membranes  Eyes: Anicteric  Lungs:  Clear to auscultation  Heart: Regular rate and rhythm  Abdomen:  Soft, nontender, nondistended  Extremities: Trace peripheral edema.  Neurologic: Alert and oriented, moving all four extremities  Skin: No lesions  Access: Left aVF, right femoral HD temp cath    Basic Metabolic Panel: Recent Labs  Lab 04/04/24 0847 04/04/24 2215 04/05/24 0450 04/05/24 1148 04/06/24 0351 04/07/24 0714  NA 140  --  136 135 134* 135  K 6.1* 6.4* 6.6* 6.4* 4.6 4.5  CL 99  --  96* 95* 95* 94*  CO2 23  --  19* 20* 26 25  GLUCOSE 127*  --  129* 183* 114* 103*  BUN 124*  --  119* 117* 67* 43*  CREATININE 19.53*  --  20.57* 20.74* 13.38* 9.56*  CALCIUM  7.0*  --  7.0* 6.5* 7.0* 7.9*  MG  --   --   --   --  2.1 2.1  PHOS  --   --   --  8.8* 6.4* 5.6*    Liver Function Tests: Recent Labs  Lab 04/05/24 1148  04/06/24 0351 04/07/24 0714  AST  --  8* 10*  ALT  --  8 8  ALKPHOS  --  63 57  BILITOT  --  0.9 1.0  PROT  --  6.6 7.2  ALBUMIN 3.3* 3.4* 3.7   No results for input(s): "LIPASE", "AMYLASE" in the last 168 hours. No results for input(s): "AMMONIA" in the last 168 hours.  CBC: Recent Labs  Lab 04/04/24 0847 04/05/24 0450 04/06/24 0351 04/07/24 0714  WBC 4.1 5.6 3.7* 3.7*  NEUTROABS 2.0  --  1.8 1.9  HGB 11.2* 12.0* 9.8* 10.0*  HCT 33.2* 35.6* 29.1* 29.3*  MCV 95.1 93.4 93.0 91.8  PLT 214 206 163 167    Cardiac Enzymes: No results for input(s): "CKTOTAL", "CKMB", "CKMBINDEX", "TROPONINI" in the last 168 hours.  BNP: Invalid input(s): "POCBNP"  CBG: Recent Labs  Lab 04/06/24 0730 04/06/24 1638 04/06/24 2018 04/07/24 0738 04/07/24 1133  GLUCAP 118* 102* 131* 107* 170*    Microbiology: Results for orders placed or performed during the hospital encounter of 10/02/22  Resp Panel by RT-PCR (Flu A&B, Covid) Anterior Nasal Swab     Status: None   Collection Time: 10/02/22  4:06 AM   Specimen: Anterior Nasal Swab  Result Value Ref Range Status   SARS Coronavirus 2  by RT PCR NEGATIVE NEGATIVE Final    Comment: (NOTE) SARS-CoV-2 target nucleic acids are NOT DETECTED.  The SARS-CoV-2 RNA is generally detectable in upper respiratory specimens during the acute phase of infection. The lowest concentration of SARS-CoV-2 viral copies this assay can detect is 138 copies/mL. A negative result does not preclude SARS-Cov-2 infection and should not be used as the sole basis for treatment or other patient management decisions. A negative result may occur with  improper specimen collection/handling, submission of specimen other than nasopharyngeal swab, presence of viral mutation(s) within the areas targeted by this assay, and inadequate number of viral copies(<138 copies/mL). A negative result must be combined with clinical observations, patient history, and  epidemiological information. The expected result is Negative.  Fact Sheet for Patients:  BloggerCourse.com  Fact Sheet for Healthcare Providers:  SeriousBroker.it  This test is no t yet approved or cleared by the [place]  FDA and  has been authorized for detection and/or diagnosis of SARS-CoV-2 by FDA under an Emergency Use Authorization (EUA). This EUA will remain  in effect (meaning this test can be used) for the duration of the COVID-19 declaration under Section 564(b)(1) of the Act, 21 U.S.C.section 360bbb-3(b)(1), unless the authorization is terminated  or revoked sooner.       Influenza A by PCR NEGATIVE NEGATIVE Final   Influenza B by PCR NEGATIVE NEGATIVE Final    Comment: (NOTE) The Xpert Xpress SARS-CoV-2/FLU/RSV plus assay is intended as an aid in the diagnosis of influenza from Nasopharyngeal swab specimens and should not be used as a sole basis for treatment. Nasal washings and aspirates are unacceptable for Xpert Xpress SARS-CoV-2/FLU/RSV testing.  Fact Sheet for Patients: BloggerCourse.com  Fact Sheet for Healthcare Providers: SeriousBroker.it  This test is not yet approved or cleared by the [place]  FDA and has been authorized for detection and/or diagnosis of SARS-CoV-2 by FDA under an Emergency Use Authorization (EUA). This EUA will remain in effect (meaning this test can be used) for the duration of the COVID-19 declaration under Section 564(b)(1) of the Act, 21 U.S.C. section 360bbb-3(b)(1), unless the authorization is terminated or revoked.  Performed at [facility], [address], [place], [place] [address]     Coagulation Studies: No results for input(s): "LABPROT", "INR" in the last 72 hours.  Urinalysis: No results for input(s): "COLORURINE", "LABSPEC", "PHURINE", "GLUCOSEU", "HGBUR", "BILIRUBINUR", "KETONESUR", "PROTEINUR",  "UROBILINOGEN", "NITRITE", "LEUKOCYTESUR" in the last 72 hours.  Invalid input(s): "APPERANCEUR"    Imaging: No results found.    Medications:     Chlorhexidine  Gluconate Cloth  6 each Topical Q0600   insulin  aspart  5 Units Intravenous Once   And   dextrose   1 ampule Intravenous Once   heparin   5,000 Units Subcutaneous Q12H   insulin  aspart  0-6 Units Subcutaneous TID WC   LORazepam   1 mg Intravenous Once   sertraline   100 mg Oral Daily   sevelamer  carbonate  1,600 mg Oral TID WC   sodium chloride  flush  3 mL Intravenous Q12H   acetaminophen , naproxen , ondansetron  (ZOFRAN ) IV, mouth rinse, sodium chloride  flush  Assessment/ Plan:  Brandon [name] is a 63 y.o.  male past medical conditions including hypertension, diabetes, end-stage renal disease on hemodialysis.  Patient presents to the emergency department with malfunctioning dialysis access and has been admitted under observation for End stage renal disease (HCC) [N18.6] Hyperkalemia [E87.5] ESRD (end stage renal disease) on dialysis (HCC) [N18.6, Z99.2]  [facility]/TTS/left aVF  Malfunctioning  dialysis access.  AV fistula concerns all week with outpatient dialysis with infiltration.  Awaiting permcath placement with vascular.   2.  End-stage renal disease with hyperkalemia on hemodialysis.  Dialysis received yesterday, UF 2L achieved. Next treatment scheduled for Tuesday.   3. Anemia of chronic kidney disease Lab Results  Component Value Date   HGB 10.0 (L) 04/07/2024    Hemoglobin within optimal range.   4. Diabetes mellitus type II with chronic kidney disease/renal manifestations: insulin  dependent. Home regimen includes NovoLog . Most recent hemoglobin A1c is 7.0 on 4/24.   Glucose stable   LOS: 2 Brandon [name] 5/4/202511:37 AM

## 2024-04-07 NOTE — Progress Notes (Signed)
 PROGRESS NOTE    [name]  [name] DOB: Jan 23, 1961 DOA: 04/04/2024 PCP: Laneta Pintos, MD  Chief Complaint  Patient presents with   Vascular Access Problem    Hospital Course:  Brandon [name] is a 63 year old male with ESRD on hemodialysis MWF, insulin -dependent diabetes, hypertension, with recent malfunction of AV fistula.  Patient was hospitalized at [name], discharged on 4/26 due to malfunctioning left arm AV fistula however workup revealed no significant stenosis and patient was able to complete inpatient hemodialysis.  On Monday 4/30 patient attempted outpatient hemodialysis and staff reports left arm AV fistula was not functioning.  Patient attempted dialysis again on 5/1 but again fistula was not working.  Patient was instead sent to the ED.  On arrival to the ED vital signs are largely within normal limits, BUN 129, creatinine 19, K6.1, no acute T wave changes on EKG.  Patient was given hyperkalemia cocktail and nephrology was consulted. Per 4/25 left upper extremity angiogram AV fistula is patent but there were 2 large branch vessels coming off the left cephalic vein which could be causing competitive flow.  Vascular surgery was consulted on this admission.  Plans for permacath but due to severely elevated BUN patient was only able to receive a temporary catheter on 5/1. Stay has been further complicated by behavioral problems.  Patient was refusing labs, EKG, and becoming aggressive with the staff  Subjective: No acute events overnight. On evaluation today patient is irritated and once to go home.  We discussed that he needs a permanent catheter for dialysis outpatient.   Objective: Vitals:   04/06/24 1345 04/06/24 1411 04/06/24 1613 04/06/24 2013  BP: 119/69 (!) 132/55 (!) 145/80 (!) 96/38  Pulse: 80 79 80 71  Resp: 14 16 16 16   Temp: 97.6 F (36.4 C) 98.6 F (37 C) 99.3 F (37.4 C) 98.3 F (36.8 C)  TempSrc: Oral Oral Oral   SpO2: 100% 100% 100% 100%   Weight: 99.5 kg     Height:        Intake/Output Summary (Last 24 hours) at 04/07/2024 0840 Last data filed at 04/06/2024 1345 Gross per 24 hour  Intake --  Output 2000 ml  Net -2000 ml   Filed Weights   04/05/24 1600 04/06/24 0950 04/06/24 1345  Weight: 104.3 kg 101.7 kg 99.5 kg    Examination: General exam: Appears calm and comfortable, NAD  Respiratory system: No work of breathing, symmetric chest wall expansion Cardiovascular system: S1 & S2 heard, RRR.  Gastrointestinal system: Abdomen is nondistended, soft and nontender.  Neuro: Alert and oriented. No focal neurological deficits. Extremities: Left AV fistula without erythema, swelling, oozing.  Some surrounding bruising. Skin: No rashes, lesions Psychiatry: Mood and affect congruent.  Irritated.  Assessment & Plan:  Principal Problem:   Hyperkalemia Active Problems:   Acute on chronic diastolic CHF (congestive heart failure) (HCC)   AV fistula occlusion, initial encounter (HCC)    ESRD on hemodialysis Malfunctioning AV fistula - Vascular surgery consulted for PermCath, received temporary catheter 5/1.  Needs PermCath placement.  Likely tomorrow. - Nephrology consulted, hemodialysis per renal - Currently tolerating well - BUN and potassium have resolved - Continue to trend CMP - Fistula workup thus far has revealed patent fistula though there are 2 of branching large vessels which could be causing competitive flow.  Appreciate vascular recommendations  Aggressive behaviors - Complicating care.  Resolved now. - Continue close monitoring - RN to call security for safety if needed  Severe hyperkalemia -  Received shifters.  Improved now after dialysis - Continue to trend daily CMP, correct as needed  Insulin -dependent diabetes - Monitor CBGs, titrate insulin  as needed  Hypertension - Continue to monitor, continue current meds.  Titrate as needed  DVT prophylaxis: Heparin    Code Status: Full  Code Disposition:  Consultants: Nephrology Vascular surgery   Procedures:  Temporary Cath placement 5/1  Antimicrobials:  Anti-infectives (From admission, onward)    None       Data Reviewed: I have personally reviewed following labs and imaging studies CBC: Recent Labs  Lab 04/04/24 0847 04/05/24 0450 04/06/24 0351 04/07/24 0714  WBC 4.1 5.6 3.7* 3.7*  NEUTROABS 2.0  --  1.8 1.9  HGB 11.2* 12.0* 9.8* 10.0*  HCT 33.2* 35.6* 29.1* 29.3*  MCV 95.1 93.4 93.0 91.8  PLT 214 206 163 167   Basic Metabolic Panel: Recent Labs  Lab 04/04/24 0847 04/04/24 2215 04/05/24 0450 04/05/24 1148 04/06/24 0351 04/07/24 0714  NA 140  --  136 135 134* PENDING  K 6.1* 6.4* 6.6* 6.4* 4.6 PENDING  CL 99  --  96* 95* 95* PENDING  CO2 23  --  19* 20* 26 PENDING  GLUCOSE 127*  --  129* 183* 114* 103*  BUN 124*  --  119* 117* 67* 43*  CREATININE 19.53*  --  20.57* 20.74* 13.38* 9.56*  CALCIUM  7.0*  --  7.0* 6.5* 7.0* PENDING  MG  --   --   --   --  2.1 2.1  PHOS  --   --   --  8.8* 6.4* 5.6*   GFR: Estimated Creatinine Clearance: 10 mL/min (A) (by C-G formula based on SCr of 9.56 mg/dL (H)). Liver Function Tests: Recent Labs  Lab 04/05/24 1148 04/06/24 0351 04/07/24 0714  AST  --  8* 10*  ALT  --  8 8  ALKPHOS  --  63 57  BILITOT  --  0.9 1.0  PROT  --  6.6 7.2  ALBUMIN 3.3* 3.4* 3.7   CBG: Recent Labs  Lab 04/05/24 2214 04/06/24 0730 04/06/24 1638 04/06/24 2018 04/07/24 0738  GLUCAP 197* 118* 102* 131* 107*    No results found for this or any previous visit (from the past 240 hours).   Radiology Studies: No results found.   Scheduled Meds:  Chlorhexidine  Gluconate Cloth  6 each Topical Q0600   insulin  aspart  5 Units Intravenous Once   And   dextrose   1 ampule Intravenous Once   heparin   5,000 Units Subcutaneous Q12H   insulin  aspart  0-6 Units Subcutaneous TID WC   LORazepam   1 mg Intravenous Once   sertraline   100 mg Oral Daily   sevelamer   carbonate  1,600 mg Oral TID WC   sodium chloride  flush  3 mL Intravenous Q12H   Continuous Infusions:     LOS: 2 days  MDM: Patient is high risk for one or more organ failure.  They necessitate ongoing hospitalization for continued IV therapies and subsequent lab monitoring. Total time spent interpreting labs and vitals, reviewing the medical record, coordinating care amongst consultants and care team members, directly assessing and discussing care with the patient and/or family: 55 min  Velvia Mehrer, DO [facility]  To contact the attending physician between 7A-7P please use Epic Chat. To contact the covering physician during after hours 7P-7A, please review Amion.  04/07/2024, 8:40 AM   *This document has been created with the assistance of dictation software. Please excuse typographical errors. *

## 2024-04-08 ENCOUNTER — Encounter: Payer: Self-pay | Admitting: Vascular Surgery

## 2024-04-08 ENCOUNTER — Encounter
Admission: EM | Disposition: A | Discharge status: Home / Self Care | Payer: Self-pay | Source: Home / Self Care | Attending: Family Medicine

## 2024-04-08 DIAGNOSIS — E875 Hyperkalemia: Secondary | ICD-10-CM | POA: Diagnosis not present

## 2024-04-08 DIAGNOSIS — T82898A Other specified complication of vascular prosthetic devices, implants and grafts, initial encounter: Secondary | ICD-10-CM | POA: Diagnosis not present

## 2024-04-08 DIAGNOSIS — I5033 Acute on chronic diastolic (congestive) heart failure: Secondary | ICD-10-CM | POA: Diagnosis not present

## 2024-04-08 HISTORY — PX: DIALYSIS/PERMA CATHETER INSERTION: CATH118288

## 2024-04-08 LAB — CBC WITH DIFFERENTIAL/PLATELET
Abs Immature Granulocytes: 0.02 10*3/uL (ref 0.00–0.07)
Basophils Absolute: 0 10*3/uL (ref 0.0–0.1)
Basophils Relative: 1 %
Eosinophils Absolute: 0.1 10*3/uL (ref 0.0–0.5)
Eosinophils Relative: 3 %
HCT: 29.7 % — ABNORMAL LOW (ref 39.0–52.0)
Hemoglobin: 9.9 g/dL — ABNORMAL LOW (ref 13.0–17.0)
Immature Granulocytes: 1 %
Lymphocytes Relative: 32 %
Lymphs Abs: 1.3 10*3/uL (ref 0.7–4.0)
MCH: 30.7 pg (ref 26.0–34.0)
MCHC: 33.3 g/dL (ref 30.0–36.0)
MCV: 92 fL (ref 80.0–100.0)
Monocytes Absolute: 0.4 10*3/uL (ref 0.1–1.0)
Monocytes Relative: 10 %
Neutro Abs: 2.2 10*3/uL (ref 1.7–7.7)
Neutrophils Relative %: 53 %
Platelets: 169 10*3/uL (ref 150–400)
RBC: 3.23 MIL/uL — ABNORMAL LOW (ref 4.22–5.81)
RDW: 12.2 % (ref 11.5–15.5)
WBC: 4 10*3/uL (ref 4.0–10.5)
nRBC: 0 % (ref 0.0–0.2)

## 2024-04-08 LAB — COMPREHENSIVE METABOLIC PANEL WITH GFR
ALT: 10 U/L (ref 0–44)
AST: 12 U/L — ABNORMAL LOW (ref 15–41)
Albumin: 3.7 g/dL (ref 3.5–5.0)
Alkaline Phosphatase: 61 U/L (ref 38–126)
Anion gap: 16 — ABNORMAL HIGH (ref 5–15)
BUN: 58 mg/dL — ABNORMAL HIGH (ref 6–20)
CO2: 25 mmol/L (ref 22–32)
Calcium: 7.6 mg/dL — ABNORMAL LOW (ref 8.9–10.3)
Chloride: 95 mmol/L — ABNORMAL LOW (ref 98–111)
Creatinine, Ser: 11.75 mg/dL — ABNORMAL HIGH (ref 0.61–1.24)
GFR, Estimated: 4 mL/min — ABNORMAL LOW (ref >60)
Glucose, Bld: 120 mg/dL — ABNORMAL HIGH (ref 70–99)
Potassium: 4.5 mmol/L (ref 3.5–5.1)
Sodium: 136 mmol/L (ref 135–145)
Total Bilirubin: 1.1 mg/dL (ref 0.0–1.2)
Total Protein: 7.2 g/dL (ref 6.5–8.1)

## 2024-04-08 LAB — MAGNESIUM: Magnesium: 2 mg/dL (ref 1.7–2.4)

## 2024-04-08 LAB — PHOSPHORUS: Phosphorus: 6.1 mg/dL — ABNORMAL HIGH (ref 2.5–4.6)

## 2024-04-08 LAB — GLUCOSE, CAPILLARY: Glucose-Capillary: 120 mg/dL — ABNORMAL HIGH (ref 70–99)

## 2024-04-08 SURGERY — DIALYSIS/PERMA CATHETER INSERTION
Anesthesia: Moderate Sedation

## 2024-04-08 MED ORDER — CEFAZOLIN SODIUM-DEXTROSE 1-4 GM/50ML-% IV SOLN
INTRAVENOUS | Status: AC
  Filled 2024-04-08: qty 50

## 2024-04-08 MED ORDER — SODIUM CHLORIDE 0.9 % IV SOLN: INTRAVENOUS | Status: DC

## 2024-04-08 MED ORDER — AMLODIPINE BESYLATE 10 MG PO TABS: 10.0000 mg | ORAL_TABLET | Freq: Every day | ORAL | 1 refills | Status: AC

## 2024-04-08 MED ORDER — ACETAMINOPHEN-CODEINE 300-30 MG PO TABS: 1.0000 | ORAL_TABLET | Freq: Four times a day (QID) | ORAL | 0 refills | Status: AC | PRN

## 2024-04-08 MED ORDER — FENTANYL CITRATE (PF) 100 MCG/2ML IJ SOLN
INTRAMUSCULAR | Status: DC | PRN
  Administered 2024-04-08: 25 ug via INTRAVENOUS
  Administered 2024-04-08: 50 ug via INTRAVENOUS

## 2024-04-08 MED ORDER — HEPARIN SODIUM (PORCINE) 10000 UNIT/ML IJ SOLN
INTRAMUSCULAR | Status: DC | PRN
  Administered 2024-04-08: 10000 [IU]

## 2024-04-08 MED ORDER — LIDOCAINE-EPINEPHRINE (PF) 1 %-1:200000 IJ SOLN
INTRAMUSCULAR | Status: DC | PRN
  Administered 2024-04-08: 40 mL

## 2024-04-08 MED ORDER — HEPARIN (PORCINE) IN NACL 1000-0.9 UT/500ML-% IV SOLN
INTRAVENOUS | Status: DC | PRN
  Administered 2024-04-08: 500 mL

## 2024-04-08 MED ORDER — CEFAZOLIN SODIUM-DEXTROSE 1-4 GM/50ML-% IV SOLN
1.0000 g | INTRAVENOUS | Status: AC
  Administered 2024-04-08: 1 g via INTRAVENOUS

## 2024-04-08 MED ORDER — FENTANYL CITRATE (PF) 100 MCG/2ML IJ SOLN
INTRAMUSCULAR | Status: AC
Start: 2024-04-08 — End: ?
  Filled 2024-04-08: qty 2

## 2024-04-08 MED ORDER — MIDAZOLAM HCL 2 MG/2ML IJ SOLN
INTRAMUSCULAR | Status: DC | PRN
  Administered 2024-04-08 (×2): 1 mg via INTRAVENOUS

## 2024-04-08 MED ORDER — MIDAZOLAM HCL 5 MG/5ML IJ SOLN
INTRAMUSCULAR | Status: AC
  Filled 2024-04-08: qty 5

## 2024-04-08 SURGICAL SUPPLY — 10 items
BIOPATCH RED 1 DISK 7.0 (GAUZE/BANDAGES/DRESSINGS) IMPLANT
CATH CANNON HEMO 15FR 23CM (HEMODIALYSIS SUPPLIES) IMPLANT
COVER PROBE ULTRASOUND 5X96 (MISCELLANEOUS) IMPLANT
DERMABOND ADVANCED .7 DNX12 (GAUZE/BANDAGES/DRESSINGS) IMPLANT
KIT MICROPUNCTURE VSI 5F STIFF (SHEATH) IMPLANT
NDL ENTRY 21GA 7CM ECHOTIP (NEEDLE) IMPLANT
NEEDLE ENTRY 21GA 7CM ECHOTIP (NEEDLE) ×1 IMPLANT
PACK ANGIOGRAPHY (CUSTOM PROCEDURE TRAY) ×1 IMPLANT
SUT MNCRL AB 4-0 PS2 18 (SUTURE) IMPLANT
SUT PROLENE 0 CT 1 30 (SUTURE) IMPLANT

## 2024-04-08 NOTE — Progress Notes (Signed)
 [facility]  ROUNDING NOTE   Subjective:   Brandon [name] is a 63 year old male patient known to our practice.  Currently receives outpatient dialysis treatments at [facility] on a TTS schedule, supervised by Dr. Rhesa Celeste.  Patient presents to the emergency department with a malfunctioning dialysis access.  Patient seen sitting up in chair Alert and oriented, pleasant NPO for vascular procedure   Objective:  Vital signs in last 24 hours:  Temp:  [97.7 F (36.5 C)-98.8 F (37.1 C)] 97.7 F (36.5 C) (05/05 0911) Pulse Rate:  [0-93] 91 (05/05 1130) Resp:  [0-19] 15 (05/05 1130) BP: (115-171)/(64-92) 158/70 (05/05 1130) SpO2:  [83 %-100 %] 100 % (05/05 1130) Weight:  [100.4 kg] 100.4 kg (05/05 0500)  Weight change: -1.3 kg Filed Weights   04/06/24 0950 04/06/24 1345 04/08/24 0500  Weight: 101.7 kg 99.5 kg 100.4 kg    Intake/Output: I/O last 3 completed shifts: In: 340 [P.O.:340] Out: -    Intake/Output this shift:  No intake/output data recorded.  Physical Exam: General: NAD, pleasant   Head: Normocephalic, atraumatic. Moist oral mucosal membranes  Eyes: Anicteric  Lungs:  Clear to auscultation  Heart: Regular rate and rhythm  Abdomen:  Soft, nontender, nondistended  Extremities: Trace peripheral edema.  Neurologic: Alert and oriented, moving all four extremities  Skin: No lesions  Access: Left aVF, right femoral HD temp cath    Basic Metabolic Panel: Recent Labs  Lab 04/05/24 0450 04/05/24 1148 04/06/24 0351 04/07/24 0714 04/08/24 0724  NA 136 135 134* 135 136  K 6.6* 6.4* 4.6 4.5 4.5  CL 96* 95* 95* 94* 95*  CO2 19* 20* 26 25 25   GLUCOSE 129* 183* 114* 103* 120*  BUN 119* 117* 67* 43* 58*  CREATININE 20.57* 20.74* 13.38* 9.56* 11.75*  CALCIUM  7.0* 6.5* 7.0* 7.9* 7.6*  MG  --   --  2.1 2.1 2.0  PHOS  --  8.8* 6.4* 5.6* 6.1*    Liver Function Tests: Recent Labs  Lab 04/05/24 1148 04/06/24 0351 04/07/24 0714 04/08/24 0724   AST  --  8* 10* 12*  ALT  --  8 8 10   ALKPHOS  --  63 57 61  BILITOT  --  0.9 1.0 1.1  PROT  --  6.6 7.2 7.2  ALBUMIN 3.3* 3.4* 3.7 3.7   No results for input(s): "LIPASE", "AMYLASE" in the last 168 hours. No results for input(s): "AMMONIA" in the last 168 hours.  CBC: Recent Labs  Lab 04/04/24 0847 04/05/24 0450 04/06/24 0351 04/07/24 0714 04/08/24 0724  WBC 4.1 5.6 3.7* 3.7* 4.0  NEUTROABS 2.0  --  1.8 1.9 2.2  HGB 11.2* 12.0* 9.8* 10.0* 9.9*  HCT 33.2* 35.6* 29.1* 29.3* 29.7*  MCV 95.1 93.4 93.0 91.8 92.0  PLT 214 206 163 167 169    Cardiac Enzymes: No results for input(s): "CKTOTAL", "CKMB", "CKMBINDEX", "TROPONINI" in the last 168 hours.  BNP: Invalid input(s): "POCBNP"  CBG: Recent Labs  Lab 04/07/24 0738 04/07/24 1133 04/07/24 1650 04/07/24 2105 04/08/24 0801  GLUCAP 107* 170* 145* 148* 120*    Microbiology: Results for orders placed or performed during the hospital encounter of 10/02/22  Resp Panel by RT-PCR (Flu A&B, Covid) Anterior Nasal Swab     Status: None   Collection Time: 10/02/22  4:06 AM   Specimen: Anterior Nasal Swab  Result Value Ref Range Status   SARS Coronavirus 2 by RT PCR NEGATIVE NEGATIVE Final    Comment: (NOTE) SARS-CoV-2 target  nucleic acids are NOT DETECTED.  The SARS-CoV-2 RNA is generally detectable in upper respiratory specimens during the acute phase of infection. The lowest concentration of SARS-CoV-2 viral copies this assay can detect is 138 copies/mL. A negative result does not preclude SARS-Cov-2 infection and should not be used as the sole basis for treatment or other patient management decisions. A negative result may occur with  improper specimen collection/handling, submission of specimen other than nasopharyngeal swab, presence of viral mutation(s) within the areas targeted by this assay, and inadequate number of viral copies(<138 copies/mL). A negative result must be combined with clinical observations,  patient history, and epidemiological information. The expected result is Negative.  Fact Sheet for Patients:  BloggerCourse.com  Fact Sheet for Healthcare Providers:  SeriousBroker.it  This test is no t yet approved or cleared by the [place]  FDA and  has been authorized for detection and/or diagnosis of SARS-CoV-2 by FDA under an Emergency Use Authorization (EUA). This EUA will remain  in effect (meaning this test can be used) for the duration of the COVID-19 declaration under Section 564(b)(1) of the Act, 21 U.S.C.section 360bbb-3(b)(1), unless the authorization is terminated  or revoked sooner.       Influenza A by PCR NEGATIVE NEGATIVE Final   Influenza B by PCR NEGATIVE NEGATIVE Final    Comment: (NOTE) The Xpert Xpress SARS-CoV-2/FLU/RSV plus assay is intended as an aid in the diagnosis of influenza from Nasopharyngeal swab specimens and should not be used as a sole basis for treatment. Nasal washings and aspirates are unacceptable for Xpert Xpress SARS-CoV-2/FLU/RSV testing.  Fact Sheet for Patients: BloggerCourse.com  Fact Sheet for Healthcare Providers: SeriousBroker.it  This test is not yet approved or cleared by the [place]  FDA and has been authorized for detection and/or diagnosis of SARS-CoV-2 by FDA under an Emergency Use Authorization (EUA). This EUA will remain in effect (meaning this test can be used) for the duration of the COVID-19 declaration under Section 564(b)(1) of the Act, 21 U.S.C. section 360bbb-3(b)(1), unless the authorization is terminated or revoked.  Performed at [facility], [address] [place], [place] [address]     Coagulation Studies: No results for input(s): "LABPROT", "INR" in the last 72 hours.  Urinalysis: No results for input(s): "COLORURINE", "LABSPEC", "PHURINE", "GLUCOSEU", "HGBUR", "BILIRUBINUR",  "KETONESUR", "PROTEINUR", "UROBILINOGEN", "NITRITE", "LEUKOCYTESUR" in the last 72 hours.  Invalid input(s): "APPERANCEUR"    Imaging: PERIPHERAL VASCULAR CATHETERIZATION Result Date: 04/08/2024 See surgical note for result.     Medications:     [MAR Hold] Chlorhexidine  Gluconate Cloth  6 each Topical Q0600   [MAR Hold] insulin  aspart  5 Units Intravenous Once   And   [MAR Hold] dextrose   1 ampule Intravenous Once   [MAR Hold] heparin   5,000 Units Subcutaneous Q12H   [MAR Hold] insulin  aspart  0-6 Units Subcutaneous TID WC   [MAR Hold] LORazepam   1 mg Intravenous Once   [MAR Hold] sertraline   100 mg Oral Daily   [MAR Hold] sevelamer  carbonate  1,600 mg Oral TID WC   [MAR Hold] sodium chloride  flush  3 mL Intravenous Q12H   [MAR Hold] acetaminophen , Heparin  (Porcine) in NaCl, heparin , [MAR Hold] naproxen , [MAR Hold] ondansetron  (ZOFRAN ) IV, [MAR Hold] mouth rinse, [MAR Hold] sodium chloride  flush  Assessment/ Plan:  Mr. CRISTION RIBELIN is a 63 y.o.  male past medical conditions including hypertension, diabetes, end-stage renal disease on hemodialysis.  Patient presents to the emergency department with malfunctioning dialysis access and has been  admitted under observation for End stage renal disease (HCC) [N18.6] Hyperkalemia [E87.5] ESRD (end stage renal disease) on dialysis (HCC) [N18.6, Z99.2]  [facility]/TTS/left aVF  Malfunctioning dialysis access.  Appreciate vascular surgery placing permcath. Will follow up outpatient with vascular for any further intervention of fistula.   2.  End-stage renal disease with hyperkalemia on hemodialysis. Potassium stable  Next treatment scheduled for Tuesday.   3. Anemia of chronic kidney disease Lab Results  Component Value Date   HGB 9.9 (L) 04/08/2024    Hemoglobin within optimal range.   4. Diabetes mellitus type II with chronic kidney disease/renal manifestations: insulin  dependent. Home regimen includes NovoLog .  Most recent hemoglobin A1c is 7.0 on 4/24.   Glucose stable   LOS: 3 [place] 5/5/202511:36 AM

## 2024-04-08 NOTE — Plan of Care (Signed)
 The patient shows no s/s of acute distress.

## 2024-04-08 NOTE — Interval H&P Note (Signed)
 History and Physical Interval Note:  04/08/2024 9:11 AM  Brandon [name]  has presented today for surgery, with the diagnosis of ESRD.  The various methods of treatment have been discussed with the patient and family. After consideration of risks, benefits and other options for treatment, the patient has consented to  Procedure(s): DIALYSIS/PERMA CATHETER INSERTION (N/A) as a surgical intervention.  The patient's history has been reviewed, patient examined, no change in status, stable for surgery.  I have reviewed the patient's chart and labs.  Questions were answered to the patient's satisfaction.     [name]

## 2024-04-08 NOTE — Progress Notes (Signed)
 Consult to HF Navigation Team Placed. Unfortunately, this patient does not meet criteria given ESRD on HD and GDMT is limited. Please feel free to reach out with any questions or medication assistance needs.   Thank you for involving the HF Navigation Team in this patient's care.  Enos Fling, PharmD, BCPS Clinical Pharmacist 07/13/2023 12:50 PM

## 2024-04-08 NOTE — Op Note (Signed)
 OPERATIVE NOTE    PRE-OPERATIVE DIAGNOSIS: 1. ESRD   POST-OPERATIVE DIAGNOSIS: same as above  PROCEDURE: Ultrasound guidance for vascular access to the left internal jugular vein Fluoroscopic guidance for placement of catheter Placement of a 23 cm tip to cuff tunneled hemodialysis catheter via the left internal jugular vein  SURGEON: Mikki Alexander, MD  ANESTHESIA:  Local with Moderate conscious sedation for approximately 32 minutes using 2 mg of Versed  and 75 mcg of Fentanyl   ESTIMATED BLOOD LOSS: 5 cc  FLUORO TIME: less than one minute  CONTRAST: none  FINDING(S): 1.  Patent left internal jugular vein  SPECIMEN(S):  None  INDICATIONS:   Brandon [name] is a 63 y.o. male who presents with renal failure.  The patient needs long term dialysis access for their ESRD, and a Permcath is necessary.  Risks and benefits are discussed and informed consent is obtained.    DESCRIPTION: After obtaining full informed written consent, the patient was brought back to the vascular suited. The patient's left neck and chest were sterilely prepped and draped in a sterile surgical field was created. Moderate conscious sedation was administered during a face to face encounter with the patient throughout the procedure with my supervision of the RN administering medicines and monitoring the patient's vital signs, pulse oximetry, telemetry and mental status throughout from the start of the procedure until the patient was taken to the recovery room.  The left internal jugular vein was visualized with ultrasound and found to be patent. It was then accessed under direct ultrasound guidance with a micropuncture needle and a permanent image was recorded. A micropuncture wire and sheath were placed.   A 0.035 J wire was placed. After skin nick and dilatation, the peel-away sheath was placed over the wire. I then turned my attention to an area under the clavicle. Approximately 1-2 fingerbreadths below the clavicle a  small counterincision was created and tunneled from the subclavicular incision to the access site. Using fluoroscopic guidance, a 23 centimeter tip to cuff tunneled hemodialysis catheter was selected, and tunneled from the subclavicular incision to the access site. It was then placed through the peel-away sheath and the peel-away sheath was removed. Using fluoroscopic guidance the catheter tips were parked in the right atrium. The appropriate distal connectors were placed. It withdrew blood well and flushed easily with heparinized saline and a concentrated heparin  solution was then placed. It was secured to the chest wall with 2 Prolene sutures. The access incision was closed single 4-0 Monocryl. A 4-0 Monocryl pursestring suture was placed around the exit site. Sterile dressings were placed. The patient tolerated the procedure well and was taken to the recovery room in stable condition.  COMPLICATIONS: None  CONDITION: Stable  Mikki Alexander  04/08/2024, 11:04 AM   This note was created with Dragon Medical transcription system. Any errors in dictation are purely unintentional.

## 2024-04-08 NOTE — Progress Notes (Signed)
 Trialysis cath right fem removed. Patient tolerated well.  Held pressure for 15 then patient stated he would hold pressure after that. Small amount of blood noted after first 15 minutes. After 30 minutes of pressure, no increase in blood on gauze. Patient anxious to leave. New guaze placed, ride at side. Patient dressed himself and packed his belongings. W/C to car with volunteer services. Discharge instruction give by Ericka Hauser RN. All questions answered. Verbalized information back. No further needs.

## 2024-04-08 NOTE — Discharge Summary (Signed)
 Physician Discharge Summary   Patient: Brandon [name] MRN: [id number] DOB: June 13, 1961  Admit date:     04/04/2024  Discharge date: 04/08/24  Discharge Physician: Roise Cleaver   PCP: Laneta Pintos, MD   Recommendations at discharge:    Continue with regularly scheduled hemodialysis Tuesday, Thurs, Sat See vascular surgery in clinic for AV Fistula repair  Discharge Diagnoses: Principal Problem:   Hyperkalemia Active Problems:   Acute on chronic diastolic CHF (congestive heart failure) (HCC)   AV fistula occlusion, initial encounter [facility])  Resolved Problems:   * No resolved hospital problems. *  Hospital Course: Brandon [name] is a 63 year old male with ESRD on hemodialysis MWF, insulin -dependent diabetes, hypertension, with recent malfunction of AV fistula.  Patient was hospitalized at [facility], discharged on 4/26 due to malfunctioning left arm AV fistula however workup revealed no significant stenosis and patient was able to complete inpatient hemodialysis.  On Monday 4/30 patient attempted outpatient hemodialysis and staff reports left arm AV fistula was not functioning.  Patient attempted dialysis again on 5/1 but again fistula was not working.  Patient was instead sent to the ED.  On arrival to the ED vital signs are largely within normal limits, BUN 129, creatinine 19, K6.1, no acute T wave changes on EKG.  Patient was given hyperkalemia cocktail and nephrology was consulted. Per 4/25 left upper extremity angiogram AV fistula is patent but there were 2 large branch vessels coming off the left cephalic vein which could be causing competitive flow.  Vascular surgery was consulted on this admission.  There were initially plans for permacath placement but due to severely elevated BUN patient was only able to receive temporary catheter on 5/1.  He successfully received this twice with this temporary catheter.  On 5/5 he received tunneled hemodialysis catheter to the left IJ with vascular  surgery.  He was subsequently cleared for discharge by nephrology with plans to dialyze via this catheter on a regular TTS schedule outpatient.  Follow-up outpatient with vascular surgery for fistula intervention and total fixation.   Assessment and Plan: ESRD on hemodialysis Malfunctioning AV fistula - Vascular surgery consulted for PermCath, received temporary catheter 5/1.  Tunneled dialysis catheter to left IJ 5/5.  Short course Tylenol  #3 provided for postoperative pain per patient request -Nephrology is cleared for discharge.  Hemodialysis through new [facility] outpatient -- Dialyzed while admitted, BUN and potassium have resolved - Continue to trend CMP - Fistula workup thus far has revealed patent fistula though there are 2 of branching large vessels which could be causing competitive flow.  Follow-up with vascular surgery outpatient.  Referral placed.   Aggressive behaviors - Complicating care.  Resolved now.  Severe hyperkalemia - Received shifters.  Improved now after dialysis  Insulin -dependent diabetes - Monitor CBGs, titrate insulin  as needed   Hypertension - Continue to monitor, continue current meds.  Titrate as needed -- Resume home meds at DC   BMI 31 -- Outpatient follow up for lifestyle modification and risk factor management      PDMP reviewed.  Consultants: Nephrology, Vascular surgery Procedures performed: TDC to L internal jugular 5/5   Disposition: Home Diet recommendation:  Discharge Diet Orders (From admission, onward)     Start     Ordered   04/08/24 0000  Diet general        04/08/24 1433           Renal diet DISCHARGE MEDICATION: Allergies as of 04/08/2024       Reactions  Apresoline  [hydralazine ] Other (See Comments)   Chest pain if by mouth- can tolerate it via IV   Imdur  [isosorbide  Nitrate] Other (See Comments)   Headaches   Ms Contin  [morphine ] Itching        Medication List     TAKE these medications    accu-chek soft  touch lancets Use as instructed What changed:  how much to take how to take this when to take this additional instructions   acetaminophen -codeine 300-30 MG tablet Commonly known as: TYLENOL  #3 Take 1 tablet by mouth every 6 (six) hours as needed for up to 5 days for moderate pain (pain score 4-6) (for pain not relieved by tylenol ).   amLODipine  10 MG tablet Commonly known as: NORVASC  Take 1 tablet (10 mg total) by mouth daily. What changed: how much to take   naproxen  sodium 220 MG tablet Commonly known as: ALEVE  Take 440 mg by mouth 2 (two) times daily as needed (pain).   NovoLOG  FlexPen 100 UNIT/ML FlexPen Generic drug: insulin  aspart Inject 0-25 Units into the skin 3 (three) times daily as needed (blood sugar > 200).   OneTouch Ultra test strip Generic drug: glucose blood Blood sugar testing done TID for insulin  dependant Type @ diabetes - E11.65   sertraline  100 MG tablet Commonly known as: ZOLOFT  TAKE 1 TABLET BY MOUTH EVERY DAY   sevelamer  carbonate 800 MG tablet Commonly known as: RENVELA  Take 1,600 mg by mouth 3 (three) times daily with meals.        Follow-up Information     Laneta Pintos, MD Follow up.   Specialty: Family Medicine Why: Hospital follow up Contact information: [address] [place] [address] [phone]                Discharge Exam: Brandon [name] Weights   04/06/24 0950 04/06/24 1345 04/08/24 0500  Weight: 101.7 kg 99.5 kg 100.4 kg   Constitutional:  Normal appearance. Non toxic-appearing.  HENT: Head Normocephalic and atraumatic.  Mucous membranes are moist.  Eyes:  Extraocular intact. Conjunctivae normal. Pupils are equal, round, and reactive to light.  Cardiovascular: Rate and Rhythm: Normal rate and regular rhythm.  Left IJ catheter freshly bandaged, clean/dry/intact.  No surrounding bleeding or oozing. Pulmonary: Non labored, symmetric rise of chest wall.  Musculoskeletal:  Normal range of motion.  Skin: Some  ecchymosis around AV fistula, various stages of healing Neurological: No focal deficit present. alert. Oriented. Psychiatric: Mood and Affect congruent.    Condition at discharge: stable  The results of significant diagnostics from this hospitalization (including imaging, microbiology, ancillary and laboratory) are listed below for reference.   Imaging Studies: PERIPHERAL VASCULAR CATHETERIZATION Result Date: 04/08/2024 See surgical note for result.  PERIPHERAL VASCULAR CATHETERIZATION Result Date: 04/04/2024 See surgical note for result.  DG Chest 1 View Result Date: 04/04/2024 CLINICAL DATA:  Congestive heart failure. EXAM: CHEST  1 VIEW COMPARISON:  02/29/2024 FINDINGS: Shallow inspiration. Cardiac enlargement. No vascular congestion, edema, or consolidation. No pleural effusion or pneumothorax. Mediastinal contours appear intact. Degenerative changes in the spine and shoulders. IMPRESSION: Cardiac enlargement.  Lungs are clear. Electronically Signed   By: Boyce Byes M.D.   On: 04/04/2024 15:05   IR DIALY SHUNT INTRO NEEDLE/INTRACATH INITIAL W/IMG LEFT Result Date: 03/29/2024 INDICATION: 63 year old with end-stage renal disease. Difficulty cannulating the left upper extremity AV fistula. EXAM: LEFT UPPER EXTREMITY DIALYSIS FISTULOGRAM MEDICATIONS: None. ANESTHESIA/SEDATION: None FLUOROSCOPY: Radiation Exposure Index (as provided by the fluoroscopic device): 45 mGy Kerma CONTRAST:  60  mL Omnipaque  300 COMPLICATIONS: None immediate. PROCEDURE: Informed written consent was obtained from the patient after a thorough discussion of the procedural risks, benefits and alternatives. All questions were addressed. A timeout was performed prior to the initiation of the procedure. Ultrasound confirmed a patent left upper extremity AV fistula. Left upper arm AV fistula was cannulated using an Angiocath. Fistulogram images were obtained. The Angiocath was removed with manual compression at the end of the  procedure. Bandage placed over the puncture site. FINDINGS: Ultrasound images demonstrated a patent AV fistula in the left upper arm. Arterial anastomosis appeared to be widely patent on ultrasound. Fistulogram images demonstrated a patent left cephalic vein. Two large branch vessels coming off the cephalic vein draining into the brachial venous system. Cephalic arch is patent without significant stenosis. Central veins are patent. It was difficult to reflux contrast to the arterial anastomosis and likely related to brisk flow at the anastomosis based on the ultrasound findings. IMPRESSION: 1. Left upper extremity AV fistula is patent. No significant stenosis. 2. Two large branch vessels coming off the left cephalic vein could be causing competitive flow. ACCESS: This access remains amenable to future percutaneous interventions as clinically indicated. Electronically Signed   By: [name] M.D.   On: 03/29/2024 17:13    Microbiology: Results for orders placed or performed during the hospital encounter of 10/02/22  Resp Panel by RT-PCR (Flu A&B, Covid) Anterior Nasal Swab     Status: None   Collection Time: 10/02/22  4:06 AM   Specimen: Anterior Nasal Swab  Result Value Ref Range Status   SARS Coronavirus 2 by RT PCR NEGATIVE NEGATIVE Final    Comment: (NOTE) SARS-CoV-2 target nucleic acids are NOT DETECTED.  The SARS-CoV-2 RNA is generally detectable in upper respiratory specimens during the acute phase of infection. The lowest concentration of SARS-CoV-2 viral copies this assay can detect is 138 copies/mL. A negative result does not preclude SARS-Cov-2 infection and should not be used as the sole basis for treatment or other patient management decisions. A negative result may occur with  improper specimen collection/handling, submission of specimen other than nasopharyngeal swab, presence of viral mutation(s) within the areas targeted by this assay, and inadequate number of viral copies(<138  copies/mL). A negative result must be combined with clinical observations, patient history, and epidemiological information. The expected result is Negative.  Fact Sheet for Patients:  BloggerCourse.com  Fact Sheet for Healthcare Providers:  SeriousBroker.it  This test is no t yet approved or cleared by the [place]  FDA and  has been authorized for detection and/or diagnosis of SARS-CoV-2 by FDA under an Emergency Use Authorization (EUA). This EUA will remain  in effect (meaning this test can be used) for the duration of the COVID-19 declaration under Section 564(b)(1) of the Act, 21 U.S.C.section 360bbb-3(b)(1), unless the authorization is terminated  or revoked sooner.       Influenza A by PCR NEGATIVE NEGATIVE Final   Influenza B by PCR NEGATIVE NEGATIVE Final    Comment: (NOTE) The Xpert Xpress SARS-CoV-2/FLU/RSV plus assay is intended as an aid in the diagnosis of influenza from Nasopharyngeal swab specimens and should not be used as a sole basis for treatment. Nasal washings and aspirates are unacceptable for Xpert Xpress SARS-CoV-2/FLU/RSV testing.  Fact Sheet for Patients: BloggerCourse.com  Fact Sheet for Healthcare Providers: SeriousBroker.it  This test is not yet approved or cleared by the [place]  FDA and has been authorized for detection and/or diagnosis of SARS-CoV-2 by FDA under  an Emergency Use Authorization (EUA). This EUA will remain in effect (meaning this test can be used) for the duration of the COVID-19 declaration under Section 564(b)(1) of the Act, 21 U.S.C. section 360bbb-3(b)(1), unless the authorization is terminated or revoked.  Performed at [facility], [address] [place], [place] [address]     Labs: CBC: Recent Labs  Lab 04/04/24 [phone] 04/05/24 0450 04/06/24 0351 04/07/24 0714 04/08/24 0724  WBC 4.1 5.6 3.7* 3.7*  4.0  NEUTROABS 2.0  --  1.8 1.9 2.2  HGB 11.2* 12.0* 9.8* 10.0* 9.9*  HCT 33.2* 35.6* 29.1* 29.3* 29.7*  MCV 95.1 93.4 93.0 91.8 92.0  PLT 214 206 163 167 169   Basic Metabolic Panel: Recent Labs  Lab 04/05/24 0450 04/05/24 1148 04/06/24 0351 04/07/24 0714 04/08/24 0724  NA 136 135 134* 135 136  K 6.6* 6.4* 4.6 4.5 4.5  CL 96* 95* 95* 94* 95*  CO2 19* 20* 26 25 25   GLUCOSE 129* 183* 114* 103* 120*  BUN 119* 117* 67* 43* 58*  CREATININE 20.57* 20.74* 13.38* 9.56* 11.75*  CALCIUM  7.0* 6.5* 7.0* 7.9* 7.6*  MG  --   --  2.1 2.1 2.0  PHOS  --  8.8* 6.4* 5.6* 6.1*   Liver Function Tests: Recent Labs  Lab 04/05/24 1148 04/06/24 0351 04/07/24 0714 04/08/24 0724  AST  --  8* 10* 12*  ALT  --  8 8 10   ALKPHOS  --  63 57 61  BILITOT  --  0.9 1.0 1.1  PROT  --  6.6 7.2 7.2  ALBUMIN 3.3* 3.4* 3.7 3.7   CBG: Recent Labs  Lab 04/07/24 0738 04/07/24 1133 04/07/24 1650 04/07/24 2105 04/08/24 0801  GLUCAP 107* 170* 145* 148* 120*    Discharge time spent: 32 minutes.  Signed: Vesna Kable, DO [facility] 04/08/2024

## 2024-04-09 ENCOUNTER — Telehealth: Payer: Self-pay

## 2024-04-09 NOTE — Transitions of Care (Post Inpatient/ED Visit) (Signed)
   04/09/2024  Name: Brandon [name] MRN: [id number] DOB: February 12, 1961  Today's TOC FU Call Status: Today's TOC FU Call Status:: Unsuccessful Call (1st Attempt) Unsuccessful Call (1st Attempt) Date: 04/09/24  Attempted to reach the patient regarding the most recent Inpatient/ED visit.  Follow Up Plan: Additional outreach attempts will be made to reach the patient to complete the Transitions of Care (Post Inpatient/ED visit) call.    Orpha Blade, RN, BSN, CEN [employer]- Transition of Care Team.  [facility] [phone]

## 2024-04-10 ENCOUNTER — Telehealth: Payer: Self-pay

## 2024-04-10 NOTE — Transitions of Care (Post Inpatient/ED Visit) (Signed)
   04/10/2024  Name: Brandon [name] MRN: [id number] DOB: [birth year]  Today's TOC FU Call Status: Today's TOC FU Call Status:: Unsuccessful Call (2nd Attempt) Unsuccessful Call (2nd Attempt) Date: 04/10/24  Attempted to reach the patient regarding the most recent Inpatient/ED visit.  Follow Up Plan: Additional outreach attempts will be made to reach the patient to complete the Transitions of Care (Post Inpatient/ED visit) call.   Orpha Blade, RN, BSN, CEN [employer]- Transition of Care Team.  [facility] [phone]

## 2024-04-11 ENCOUNTER — Telehealth: Payer: Self-pay

## 2024-04-11 DIAGNOSIS — D509 Iron deficiency anemia, unspecified: Secondary | ICD-10-CM | POA: Diagnosis not present

## 2024-04-11 DIAGNOSIS — N186 End stage renal disease: Secondary | ICD-10-CM | POA: Diagnosis not present

## 2024-04-11 DIAGNOSIS — Z992 Dependence on renal dialysis: Secondary | ICD-10-CM | POA: Diagnosis not present

## 2024-04-11 DIAGNOSIS — N250 Renal osteodystrophy: Secondary | ICD-10-CM | POA: Diagnosis not present

## 2024-04-11 DIAGNOSIS — D631 Anemia in chronic kidney disease: Secondary | ICD-10-CM | POA: Diagnosis not present

## 2024-04-11 NOTE — Transitions of Care (Post Inpatient/ED Visit) (Signed)
   04/11/2024  Name: Brandon [name] MRN: [id number] DOB: 1961-02-24  Today's TOC FU Call Status: Today's TOC FU Call Status:: Unsuccessful Call (3rd Attempt) Unsuccessful Call (3rd Attempt) Date: 04/11/24  Attempted to reach the patient regarding the most recent Inpatient/ED visit.  Follow Up Plan: No further outreach attempts will be made at this time. We have been unable to contact the patient.  Orpha Blade, RN, BSN, CEN [employer]- Transition of Care Team.  [facility] [phone]

## 2024-04-13 DIAGNOSIS — N250 Renal osteodystrophy: Secondary | ICD-10-CM | POA: Diagnosis not present

## 2024-04-13 DIAGNOSIS — D509 Iron deficiency anemia, unspecified: Secondary | ICD-10-CM | POA: Diagnosis not present

## 2024-04-13 DIAGNOSIS — Z992 Dependence on renal dialysis: Secondary | ICD-10-CM | POA: Diagnosis not present

## 2024-04-13 DIAGNOSIS — D631 Anemia in chronic kidney disease: Secondary | ICD-10-CM | POA: Diagnosis not present

## 2024-04-13 DIAGNOSIS — N186 End stage renal disease: Secondary | ICD-10-CM | POA: Diagnosis not present

## 2024-04-16 DIAGNOSIS — N186 End stage renal disease: Secondary | ICD-10-CM | POA: Diagnosis not present

## 2024-04-16 DIAGNOSIS — N250 Renal osteodystrophy: Secondary | ICD-10-CM | POA: Diagnosis not present

## 2024-04-16 DIAGNOSIS — D631 Anemia in chronic kidney disease: Secondary | ICD-10-CM | POA: Diagnosis not present

## 2024-04-16 DIAGNOSIS — Z992 Dependence on renal dialysis: Secondary | ICD-10-CM | POA: Diagnosis not present

## 2024-04-16 DIAGNOSIS — D509 Iron deficiency anemia, unspecified: Secondary | ICD-10-CM | POA: Diagnosis not present

## 2024-04-18 DIAGNOSIS — Z992 Dependence on renal dialysis: Secondary | ICD-10-CM | POA: Diagnosis not present

## 2024-04-18 DIAGNOSIS — D631 Anemia in chronic kidney disease: Secondary | ICD-10-CM | POA: Diagnosis not present

## 2024-04-18 DIAGNOSIS — N250 Renal osteodystrophy: Secondary | ICD-10-CM | POA: Diagnosis not present

## 2024-04-18 DIAGNOSIS — N186 End stage renal disease: Secondary | ICD-10-CM | POA: Diagnosis not present

## 2024-04-18 DIAGNOSIS — D509 Iron deficiency anemia, unspecified: Secondary | ICD-10-CM | POA: Diagnosis not present

## 2024-04-20 DIAGNOSIS — N250 Renal osteodystrophy: Secondary | ICD-10-CM | POA: Diagnosis not present

## 2024-04-20 DIAGNOSIS — D631 Anemia in chronic kidney disease: Secondary | ICD-10-CM | POA: Diagnosis not present

## 2024-04-20 DIAGNOSIS — N186 End stage renal disease: Secondary | ICD-10-CM | POA: Diagnosis not present

## 2024-04-20 DIAGNOSIS — Z992 Dependence on renal dialysis: Secondary | ICD-10-CM | POA: Diagnosis not present

## 2024-04-20 DIAGNOSIS — D509 Iron deficiency anemia, unspecified: Secondary | ICD-10-CM | POA: Diagnosis not present

## 2024-04-25 DIAGNOSIS — D631 Anemia in chronic kidney disease: Secondary | ICD-10-CM | POA: Diagnosis not present

## 2024-04-25 DIAGNOSIS — D509 Iron deficiency anemia, unspecified: Secondary | ICD-10-CM | POA: Diagnosis not present

## 2024-04-25 DIAGNOSIS — Z992 Dependence on renal dialysis: Secondary | ICD-10-CM | POA: Diagnosis not present

## 2024-04-25 DIAGNOSIS — N186 End stage renal disease: Secondary | ICD-10-CM | POA: Diagnosis not present

## 2024-04-25 DIAGNOSIS — N250 Renal osteodystrophy: Secondary | ICD-10-CM | POA: Diagnosis not present

## 2024-04-27 DIAGNOSIS — N186 End stage renal disease: Secondary | ICD-10-CM | POA: Diagnosis not present

## 2024-04-27 DIAGNOSIS — D509 Iron deficiency anemia, unspecified: Secondary | ICD-10-CM | POA: Diagnosis not present

## 2024-04-27 DIAGNOSIS — N250 Renal osteodystrophy: Secondary | ICD-10-CM | POA: Diagnosis not present

## 2024-04-27 DIAGNOSIS — Z992 Dependence on renal dialysis: Secondary | ICD-10-CM | POA: Diagnosis not present

## 2024-04-27 DIAGNOSIS — D631 Anemia in chronic kidney disease: Secondary | ICD-10-CM | POA: Diagnosis not present

## 2024-04-30 DIAGNOSIS — D509 Iron deficiency anemia, unspecified: Secondary | ICD-10-CM | POA: Diagnosis not present

## 2024-04-30 DIAGNOSIS — N186 End stage renal disease: Secondary | ICD-10-CM | POA: Diagnosis not present

## 2024-04-30 DIAGNOSIS — Z992 Dependence on renal dialysis: Secondary | ICD-10-CM | POA: Diagnosis not present

## 2024-04-30 DIAGNOSIS — D631 Anemia in chronic kidney disease: Secondary | ICD-10-CM | POA: Diagnosis not present

## 2024-04-30 DIAGNOSIS — N250 Renal osteodystrophy: Secondary | ICD-10-CM | POA: Diagnosis not present

## 2024-05-02 DIAGNOSIS — D631 Anemia in chronic kidney disease: Secondary | ICD-10-CM | POA: Diagnosis not present

## 2024-05-02 DIAGNOSIS — N186 End stage renal disease: Secondary | ICD-10-CM | POA: Diagnosis not present

## 2024-05-02 DIAGNOSIS — Z992 Dependence on renal dialysis: Secondary | ICD-10-CM | POA: Diagnosis not present

## 2024-05-02 DIAGNOSIS — D509 Iron deficiency anemia, unspecified: Secondary | ICD-10-CM | POA: Diagnosis not present

## 2024-05-02 DIAGNOSIS — N250 Renal osteodystrophy: Secondary | ICD-10-CM | POA: Diagnosis not present

## 2024-05-04 DIAGNOSIS — N250 Renal osteodystrophy: Secondary | ICD-10-CM | POA: Diagnosis not present

## 2024-05-04 DIAGNOSIS — Z992 Dependence on renal dialysis: Secondary | ICD-10-CM | POA: Diagnosis not present

## 2024-05-04 DIAGNOSIS — D509 Iron deficiency anemia, unspecified: Secondary | ICD-10-CM | POA: Diagnosis not present

## 2024-05-04 DIAGNOSIS — N186 End stage renal disease: Secondary | ICD-10-CM | POA: Diagnosis not present

## 2024-05-04 DIAGNOSIS — D631 Anemia in chronic kidney disease: Secondary | ICD-10-CM | POA: Diagnosis not present

## 2024-05-07 ENCOUNTER — Telehealth: Payer: Self-pay

## 2024-05-07 NOTE — Telephone Encounter (Signed)
 05/06/2024  Patient called expressing concern about his AVFistula.  Pt reported he has recently changed dialysis centers, now at [facility]. Pt reported that it takes multiple attempts to access his AVF and that it often infiltrates. He reported the same things happening in the previous dialysis center. Pt reported significant bruising as well. Pt was advised to talk to his dialysis center about making a referral to [facility].

## 2024-05-21 NOTE — Progress Notes (Signed)
   05/21/2024  Patient ID: Brandon [name], male   DOB: Jun 18, 1961, 63 y.o.   MRN: [id number]  Patient was identified as falling into the [place] Measure - Diabetes.   Reviewed [place] referral. Patient is ESRD-IHD, not requiring intensive BG lowering. Last A1c 7.0 03/2024  Lab Results  Component Value Date   HGBA1C 7.0 (H) 03/28/2024   HGBA1C 8.7 (H) 09/06/2023   HGBA1C 5.0 11/09/2022   No outreach at this time.   [name], PharmD, BCGP Clinical Pharmacist  [phone]

## 2024-05-23 ENCOUNTER — Ambulatory Visit (INDEPENDENT_AMBULATORY_CARE_PROVIDER_SITE_OTHER): Admitting: Family Medicine

## 2024-05-23 ENCOUNTER — Encounter: Payer: Self-pay | Admitting: Family Medicine

## 2024-05-23 ENCOUNTER — Other Ambulatory Visit: Payer: Self-pay | Admitting: *Deleted

## 2024-05-23 ENCOUNTER — Ambulatory Visit: Payer: Self-pay

## 2024-05-23 VITALS — BP 101/61 | HR 80 | Ht 72.0 in | Wt 209.0 lb

## 2024-05-23 DIAGNOSIS — F339 Major depressive disorder, recurrent, unspecified: Secondary | ICD-10-CM | POA: Diagnosis not present

## 2024-05-23 DIAGNOSIS — E1159 Type 2 diabetes mellitus with other circulatory complications: Secondary | ICD-10-CM

## 2024-05-23 DIAGNOSIS — E1142 Type 2 diabetes mellitus with diabetic polyneuropathy: Secondary | ICD-10-CM | POA: Diagnosis not present

## 2024-05-23 DIAGNOSIS — I152 Hypertension secondary to endocrine disorders: Secondary | ICD-10-CM

## 2024-05-23 DIAGNOSIS — L97509 Non-pressure chronic ulcer of other part of unspecified foot with unspecified severity: Secondary | ICD-10-CM | POA: Insufficient documentation

## 2024-05-23 DIAGNOSIS — Z794 Long term (current) use of insulin: Secondary | ICD-10-CM

## 2024-05-23 DIAGNOSIS — N186 End stage renal disease: Secondary | ICD-10-CM

## 2024-05-23 DIAGNOSIS — L97511 Non-pressure chronic ulcer of other part of right foot limited to breakdown of skin: Secondary | ICD-10-CM

## 2024-05-23 MED ORDER — LANCET DEVICE MISC: 0 refills | Status: DC

## 2024-05-23 MED ORDER — SERTRALINE HCL 100 MG PO TABS: 100.0000 mg | ORAL_TABLET | Freq: Every day | ORAL | 0 refills | Status: DC

## 2024-05-23 MED ORDER — BLOOD GLUCOSE MONITORING SUPPL DEVI: 0 refills | Status: DC

## 2024-05-23 MED ORDER — NOVOLOG FLEXPEN 100 UNIT/ML ~~LOC~~ SOPN: 0.0000 [IU] | PEN_INJECTOR | Freq: Three times a day (TID) | SUBCUTANEOUS | 2 refills | Status: AC | PRN

## 2024-05-23 MED ORDER — MUPIROCIN CALCIUM 2 % EX CREA: 1.0000 | TOPICAL_CREAM | Freq: Two times a day (BID) | CUTANEOUS | 0 refills | Status: DC

## 2024-05-23 MED ORDER — BLOOD GLUCOSE TEST VI STRP: ORAL_STRIP | 5 refills | Status: DC

## 2024-05-23 MED ORDER — LANCETS MISC. MISC: 5 refills | Status: DC

## 2024-05-23 NOTE — Patient Instructions (Signed)
 It was nice to see you today,  We addressed the following topics today: -Your foot does not look infected, but if it does start to get more red, swollen, or starts to have any purulent discharge let us  know so we can reevaluate - I am sending in the glucometer, more insulin , sertraline . - I am sending in a prescription for a antibiotic cream for you to use on the foot for the next 2 weeks.  Have a great day,  Etha Henle, MD

## 2024-05-23 NOTE — Assessment & Plan Note (Signed)
 Small area of skin breakdown on small toe where patient removed callus. No signs of infection currently. Diminished sensation in both feet consistent with diabetic neuropathy.    - Advised against self-debridement of calluses or corns    - Prescribed topical antibiotic cream for 2 weeks    - Instructed to monitor for signs of infection (increased redness, swelling, drainage)    - Follow-up appointment scheduled for 06/06/2024

## 2024-05-23 NOTE — Telephone Encounter (Signed)
 FYI Only or Action Required?: Action required by provider: request for appointment.  Patient was last seen in primary care on 10/05/2023 by Laneta Pintos, MD. Called Nurse Triage reporting Wound Infection. Symptoms began yesterday. Interventions attempted: Nothing. Symptoms are: gradually worsening.  Triage Disposition: See Physician Within 24 Hours  Patient/caregiver understands and will follow disposition?: Yes        Copied from CRM [id number]. Topic: Clinical - Red Word Triage >> May 23, 2024  9:22 AM Stanly Early wrote: Red Word that prompted transfer to Nurse Triage: wound infection/diabetic Reason for Disposition  [1] Pus or cloudy fluid draining from wound AND [2] no fever  Answer Assessment - Initial Assessment Questions 1. LOCATION: Where is the wound located?      Foot right  2. WOUND APPEARANCE: What does the wound look like?      Pus in it  3. SIZE: If redness is present, ask: What is the size of the red area? (Inches, centimeters, or compare to size of a coin)      no 4. SPREAD: What's changed in the last day?  Do you see any red streaks coming from the wound?     *No Answer* 5. ONSET: When did it start to look infected?      Last night  6. MECHANISM: How did the wound start, what was the cause?     Shoes tight 7. PAIN: Is there any pain? If Yes, ask: How bad is the pain?   (Scale 1-10; or mild, moderate, severe)     No pain  8. FEVER: Do you have a fever? If Yes, ask: What is your temperature, how was it measured, and when did it start?     no 9. OTHER SYMPTOMS: Do you have any other symptoms? (e.g., shaking chills, weakness, rash elsewhere on body)     no 10. PREGNANCY: Is there any chance you are pregnant? When was your last menstrual period?       N/a  Protocols used: Wound Infection-A-AH

## 2024-05-23 NOTE — Progress Notes (Unsigned)
 Acute Office Visit  Subjective:     Patient ID: Brandon [name], male    DOB: 08/16/1961, 63 y.o.   MRN: [id number]  Chief Complaint  Patient presents with   Foot Injury    HPI Subjective - Foot pain: Patient reports rubbing from old shoes, developed hard corn-like lesion which he peeled off. Noticed black area underneath. Denies infection but concerned about appearance. - Recent hospitalization last month due to dialysis access issues. Returns Monday for vascular evaluation of infiltrated access site. - Continues regular dialysis treatments Tuesday, Thursday, Saturday. Reports starting at new center in [place] second week of July. - Blood glucose: Reports levels between 109-117. Unable to check recently due to meter falling in water . - Blood pressure: Reports home readings 93-112 systolic. Stopped amlodipine  due to significant hypotension at dialysis center (reports BP dropping to 70s-80s systolic).  Medications Insulin  as needed, surchulin.  PMH, PSH, FH, Social Hx Diabetes mellitus, end-stage renal disease on hemodialysis, hypertension.  ROS Cardiovascular: Reports low blood pressure readings. Denies chest pain. Endocrine: Reports well-controlled blood glucose levels. Integumentary: Reports corn-like lesion on foot that was peeled off.   ROS      Objective:    BP 101/61   Pulse 80   Ht 6' (1.829 m)   Wt 209 lb (94.8 kg)   SpO2 100%   BMI 28.35 kg/m  {Vitals History (Optional):23777}  Physical Exam Gen: alert, oriented.   Pulm: no resp distress Ext: absent sensation to pinprick b/l.  Thickened nails b/l.  Peeling skin with small laceration lateral to the 5th digit on the right foot.  No swelling, erythema, or discharge.   No results found for any visits on 05/23/24.      Assessment & Plan:   Ulcer of right foot, limited to breakdown of skin [facility]) Assessment & Plan:    Small area of skin breakdown on small toe where patient removed callus. No signs  of infection currently. Diminished sensation in both feet consistent with diabetic neuropathy.    - Advised against self-debridement of calluses or corns    - Prescribed topical antibiotic cream for 2 weeks    - Instructed to monitor for signs of infection (increased redness, swelling, drainage)    - Follow-up appointment scheduled for 06/06/2024   Type 2 diabetes mellitus with diabetic polyneuropathy, with long-term current use of insulin  (HCC)  Depression, recurrent (HCC) -     Sertraline  HCl; Take 1 tablet (100 mg total) by mouth daily.  Dispense: 90 tablet; Refill: 0  Hypertension associated with diabetes (HCC) Assessment & Plan: Pt has stopped his amlodipine  d/t hypotension.  Bp w/o medication today is low normal.  Advised pt to conitnue holding medication for bp and continue to check bp at home.    Other orders -     NovoLOG  FlexPen; Inject 0-25 Units into the skin 3 (three) times daily as needed (blood sugar > 200).  Dispense: 15 mL; Refill: 2 -     Blood Glucose Monitoring Suppl; Check glucose three times daily May substitute to any manufacturer covered by patient's insurance.  Dispense: 1 each; Refill: 0 -     Blood Glucose Test; Check glucose three times daily May substitute to any manufacturer covered by patient's insurance.  Dispense: 100 each; Refill: 5 -     Lancet Device; Check blood glucose three times daily May substitute to any manufacturer covered by patient's insurance.  Dispense: 1 each; Refill: 0 -     Lancets Misc.; Check  glucose three times daily May substitute to any manufacturer covered by patient's insurance.  Dispense: 100 each; Refill: 5 -     Mupirocin  Calcium ; Apply 1 Application topically 2 (two) times daily.  Dispense: 15 g; Refill: 0     Return in about 2 months (around 07/23/2024) for wound, dm.  Laneta Pintos, MD

## 2024-05-23 NOTE — Assessment & Plan Note (Signed)
 Pt has stopped his amlodipine  d/t hypotension.  Bp w/o medication today is low normal.  Advised pt to conitnue holding medication for bp and continue to check bp at home.

## 2024-05-27 ENCOUNTER — Ambulatory Visit

## 2024-05-27 ENCOUNTER — Ambulatory Visit (HOSPITAL_COMMUNITY)
Admission: RE | Admit: 2024-05-27 | Discharge: 2024-05-27 | Disposition: A | Discharge status: Home / Self Care | Source: Ambulatory Visit | Attending: Surgery | Admitting: Surgery

## 2024-05-27 ENCOUNTER — Encounter: Payer: Self-pay | Admitting: Physician Assistant

## 2024-05-27 VITALS — BP 127/82 | HR 72 | Temp 98.1°F | Ht 72.0 in | Wt 214.2 lb

## 2024-05-27 DIAGNOSIS — N186 End stage renal disease: Secondary | ICD-10-CM

## 2024-05-27 NOTE — H&P (View-Only) (Signed)
 HISTORY AND PHYSICAL     CC:  dialysis access Requesting Provider:  Chandra Toribio POUR, MD  HPI: This is a 63 y.o. male here for evaluation of his hemodialysis access.    Pt has hx of left BC AVF created 10/14/2022 by Dr. Gretta.  He recently was unable to dialyze and Dr. Marea placed [facility] x 2.  Pt had fistulogram with IR on 03/29/2024 and the fistula was patent without significant stenosis.  He was noted to have two large branch vessels coming off the left cepahlic vein that could be causing competitive flow.    He comes in today for evaluation of his fistula.  He states that they are having a hard time sticking the fistula.  He is going to [facility] in [place] due to having a catheter.  He states that one time they stuck the fistula and went too deep and he had bleeding.    Dialysis access history: -left BC AVF 10/14/2022 Dr. Gretta -right femoral [facility] 04/04/2024 Dr. Marea -left internal jugular [facility] 04/08/2024 Dr. Marea   The pt is right hand dominant.    Pt is on dialysis.   Days of dialysis if applicable:  T/T/S     HD center:  [facility] at [facility]  location.   The pt is not on a statin for cholesterol management.  The pt is not on a daily aspirin .  Other AC:  none The pt is not on medication for hypertension.  The pt is  on medication for diabetes.   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 ischemic stroke   GERD (gastroesophageal reflux disease)    Hiatal hernia    Hyperlipidemia    Hypertension     Seizures (HCC)    none since 2016, taking Keppra  - maw   Sleep apnea    Thyroid  disease    weak thyroid  per MD   Wears glasses     Past Surgical History:  Procedure Laterality Date   AV FISTULA PLACEMENT Left 10/14/2022   Procedure: LEFT BRACHIOCAPHALIC FISTULA CREATION;  Surgeon: Gretta Lonni PARAS, MD;  Location: [facility] OR;  Service: Vascular;  Laterality: Left;   BREAST BIOPSY Left 03/24/2023   US  LT BREAST BX W LOC DEV 1ST LESION IMG BX SPEC US  GUIDE 03/24/2023 [facility] MAMMOGRAPHY   COLONOSCOPY     DIALYSIS/PERMA CATHETER INSERTION N/A 04/08/2024   Procedure: DIALYSIS/PERMA CATHETER INSERTION;  Surgeon: Marea Selinda RAMAN, MD;  Location: [facility] INVASIVE CV LAB;  Service: Cardiovascular;  Laterality: N/A;   IR DIALY SHUNT INTRO NEEDLE/INTRACATH INITIAL W/IMG LEFT Left 03/29/2024   IR FLUORO GUIDE CV LINE RIGHT  10/06/2022   IR RADIOLOGIST EVAL & MGMT  04/04/2017   IR US  GUIDE VASC ACCESS RIGHT  10/06/2022   KIDNEY SURGERY     ablation of renal cell CA - 12/28, prior one was October 2012-Dr. Yamagata   TEE WITHOUT CARDIOVERSION N/A 11/19/2013  Procedure: TRANSESOPHAGEAL ECHOCARDIOGRAM (TEE);  Surgeon: Redell GORMAN Shallow, MD;  Location: [facility] ENDOSCOPY;  Service: Cardiovascular;  Laterality: N/A;   TEMPORARY DIALYSIS CATHETER N/A 04/04/2024   Procedure: TEMPORARY DIALYSIS CATHETER;  Surgeon: Marea Selinda GORMAN, MD;  Location: [facility] INVASIVE CV LAB;  Service: Cardiovascular;  Laterality: N/A;    Allergies  Allergen Reactions   Apresoline  [Hydralazine ] Other (See Comments)    Chest pain if by mouth- can tolerate it via IV   Imdur  [Isosorbide  Nitrate] Other (See Comments)    Headaches   Ms Contin  [Morphine ] Itching    Current Outpatient Medications  Medication Sig Dispense Refill   amLODipine  (NORVASC ) 10 MG tablet Take 1 tablet (10 mg total) by mouth daily. 180 tablet 1   Blood Glucose Monitoring Suppl DEVI Check glucose three times daily May substitute to any manufacturer covered by patient's  insurance. 1 each 0   Glucose Blood (BLOOD GLUCOSE TEST STRIPS) STRP Check glucose three times daily May substitute to any manufacturer covered by patient's insurance. 100 each 5   insulin  aspart (NOVOLOG  FLEXPEN) 100 UNIT/ML FlexPen Inject 0-25 Units into the skin 3 (three) times daily as needed (blood sugar > 200). 15 mL 2   Lancet Device MISC Check blood glucose three times daily May substitute to any manufacturer covered by patient's insurance. 1 each 0   Lancets (ACCU-CHEK SOFT TOUCH) lancets Use as instructed (Patient taking differently: 1 each by Other route as directed.) 100 each 12   Lancets Misc. MISC Check glucose three times daily May substitute to any manufacturer covered by patient's insurance. 100 each 5   mupirocin  cream (BACTROBAN ) 2 % Apply 1 Application topically 2 (two) times daily. 15 g 0   naproxen  sodium (ALEVE ) 220 MG tablet Take 440 mg by mouth 2 (two) times daily as needed (pain).     sertraline  (ZOLOFT ) 100 MG tablet Take 1 tablet (100 mg total) by mouth daily. 90 tablet 0   sevelamer  carbonate (RENVELA ) 800 MG tablet Take 1,600 mg by mouth 3 (three) times daily with meals. (Patient not taking: Reported on 04/03/2024)     No current facility-administered medications for this visit.    Family History  Problem Relation Age of Onset   Pneumonia Mother    Breast cancer Mother    Diabetes Father    Heart disease Father    Breast cancer Sister    Prostate cancer Other    Stomach cancer Other    Colon cancer Neg Hx    Esophageal cancer Neg Hx    Rectal cancer Neg Hx     Social History   Socioeconomic History   Marital status: Single    Spouse name: Not on file   Number of children: 2   Years of education: college   Highest education level: Not on file  Occupational History   Occupation: disabled  Tobacco Use   Smoking status: Never    Passive exposure: Never   Smokeless tobacco: Never  Vaping Use   Vaping status: Never Used  Substance and Sexual Activity    Alcohol use: Not Currently    Comment: rarely   Drug use: No   Sexual activity: Never  Other Topics Concern   Not on file  Social History Narrative   Patient lives alone.   Education. Two years of college.   Not working.   Right handed.   Caffeine one soda daily. Drinks coffee    Social Drivers of Corporate investment banker Strain: Low Risk  (  08/15/2023)   Overall Financial Resource Strain (CARDIA)    Difficulty of Paying Living Expenses: Not hard at all  Food Insecurity: Patient Declined (04/04/2024)   Hunger Vital Sign    Worried About Running Out of Food in the Last Year: Patient declined    Ran Out of Food in the Last Year: Patient declined  Transportation Needs: Patient Declined (04/04/2024)   PRAPARE - Administrator, Civil Service (Medical): Patient declined    Lack of Transportation (Non-Medical): Patient declined  Physical Activity: Sufficiently Active (08/15/2023)   Exercise Vital Sign    Days of Exercise per Week: 3 days    Minutes of Exercise per Session: 60 min  Stress: No Stress Concern Present (08/15/2023)   [employer] of Occupational Health - Occupational Stress Questionnaire    Feeling of Stress : Not at all  Social Connections: Patient Declined (04/04/2024)   Social Connection and Isolation Panel    Frequency of Communication with Friends and Family: Patient declined    Frequency of Social Gatherings with Friends and Family: Patient declined    Attends Religious Services: Patient declined    Database administrator or Organizations: Patient declined    Attends Banker Meetings: Patient declined    Marital Status: Patient declined  Intimate Partner Violence: Patient Declined (04/04/2024)   Humiliation, Afraid, Rape, and Kick questionnaire    Fear of Current or Ex-Partner: Patient declined    Emotionally Abused: Patient declined    Physically Abused: Patient declined    Sexually Abused: Patient declined     ROS: [x]  Positive   [  ] Negative   [ ]  All sytems reviewed and are negative  Cardiac: []  chest pain/pressure []  SOB/DOE  Vascular: []  pain in legs while walking []  pain in feet when lying flat []  swelling in legs  Pulmonary: [x]  OSA  Neurologic: [x]  hx CVA/TIA  Hematologic: [x]  anemia [x]  renal cancer  GI []  GERD  GU: [x]  CKD/renal failure  [x]  HD---[]  M/W/F [x]  T/T/S  Psychiatric: []  hx of major depression  Integumentary: []  rashes []  ulcers  Constitutional: []  fever []  chills   PHYSICAL EXAMINATION:  Today's Vitals   05/27/24 1217  BP: 127/82  Pulse: 72  Temp: 98.1 F (36.7 C)  TempSrc: Temporal  SpO2: 91%  Weight: 214 lb 3.2 oz (97.2 kg)  Height: 6' (1.829 m)  PainSc: 5   PainLoc: Arm   Body mass index is 29.05 kg/m.    General:  WDWN male in NAD Gait: Not observed HENT: WNL Pulmonary: normal non-labored breathing  Cardiac: regular, without carotid bruits Skin: without rashes Vascular Exam/Pulses:   Right Left  Radial 2+ (normal) 2+ (normal)   Extremities:  + thrill in LUA AVF.  Somewhat pulsatile proximally; keloid over fistula from multiple sticks.  Musculoskeletal: no muscle wasting or atrophy  Neurologic: A&O X 3  Non-Invasive Vascular Imaging:   Dialysis duplex on 05/27/2024: Fistula is patent.  There is elevated velocities in the mid upper arm .  Fistulogram with IR 03/29/2024: IMPRESSION: 1. Left upper extremity AV fistula is patent. No significant stenosis. 2. Two large branch vessels coming off the left cephalic vein could be causing competitive flow.   ASSESSMENT/PLAN: 63 y.o. male with ESRD here for evaluation of his hemodialysis access with hx of left BC AVF   -AVF infiltrated and had TDC placed by Dr. Marea -pt is on dialysis on T/T/S at [facility] location in [place] -duplex today reveals elevated velocities in the  mid uppper arm.  Discussed with pt and significant other we would proceed with fistulogram to see if any intervention could help  his fistula.  He does have a couple of competing branches that may need ligation pending results of fistulogram.  Discussed with them that even with the above, it may not work and he may need new access.  His significant other expressed good understanding.  -discussed with pt that access does not last forever and will need intervention or even new access at some point.  -pt is right hand dominant -pt is not on anticoagulation   Kienna Moncada, [facility] [phone]  Clinic MD:   Serene

## 2024-05-27 NOTE — Progress Notes (Signed)
 HISTORY AND PHYSICAL     CC:  dialysis access Requesting Provider:  Chandra Toribio POUR, MD  HPI: This is a 63 y.o. male here for evaluation of his hemodialysis access.    Pt has hx of left BC AVF created 10/14/2022 by Dr. Gretta.  He recently was unable to dialyze and Dr. Marea placed [facility] x 2.  Pt had fistulogram with IR on 03/29/2024 and the fistula was patent without significant stenosis.  He was noted to have two large branch vessels coming off the left cepahlic vein that could be causing competitive flow.    He comes in today for evaluation of his fistula.  He states that they are having a hard time sticking the fistula.  He is going to [facility] in [place] due to having a catheter.  He states that one time they stuck the fistula and went too deep and he had bleeding.    Dialysis access history: -left BC AVF 10/14/2022 Dr. Gretta -right femoral [facility] 04/04/2024 Dr. Marea -left internal jugular [facility] 04/08/2024 Dr. Marea   The pt is right hand dominant.    Pt is on dialysis.   Days of dialysis if applicable:  T/T/S     HD center:  [facility] at [facility]  location.   The pt is not on a statin for cholesterol management.  The pt is not on a daily aspirin .  Other AC:  none The pt is not on medication for hypertension.  The pt is  on medication for diabetes.   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 ischemic stroke   GERD (gastroesophageal reflux disease)    Hiatal hernia    Hyperlipidemia    Hypertension     Seizures (HCC)    none since 2016, taking Keppra  - maw   Sleep apnea    Thyroid  disease    weak thyroid  per MD   Wears glasses     Past Surgical History:  Procedure Laterality Date   AV FISTULA PLACEMENT Left 10/14/2022   Procedure: LEFT BRACHIOCAPHALIC FISTULA CREATION;  Surgeon: Gretta Lonni PARAS, MD;  Location: [facility] OR;  Service: Vascular;  Laterality: Left;   BREAST BIOPSY Left 03/24/2023   US  LT BREAST BX W LOC DEV 1ST LESION IMG BX SPEC US  GUIDE 03/24/2023 [facility] MAMMOGRAPHY   COLONOSCOPY     DIALYSIS/PERMA CATHETER INSERTION N/A 04/08/2024   Procedure: DIALYSIS/PERMA CATHETER INSERTION;  Surgeon: Marea Selinda RAMAN, MD;  Location: [facility] INVASIVE CV LAB;  Service: Cardiovascular;  Laterality: N/A;   IR DIALY SHUNT INTRO NEEDLE/INTRACATH INITIAL W/IMG LEFT Left 03/29/2024   IR FLUORO GUIDE CV LINE RIGHT  10/06/2022   IR RADIOLOGIST EVAL & MGMT  04/04/2017   IR US  GUIDE VASC ACCESS RIGHT  10/06/2022   KIDNEY SURGERY     ablation of renal cell CA - 12/28, prior one was October 2012-Dr. Yamagata   TEE WITHOUT CARDIOVERSION N/A 11/19/2013  Procedure: TRANSESOPHAGEAL ECHOCARDIOGRAM (TEE);  Surgeon: Redell GORMAN Shallow, MD;  Location: [facility] ENDOSCOPY;  Service: Cardiovascular;  Laterality: N/A;   TEMPORARY DIALYSIS CATHETER N/A 04/04/2024   Procedure: TEMPORARY DIALYSIS CATHETER;  Surgeon: Marea Selinda GORMAN, MD;  Location: [facility] INVASIVE CV LAB;  Service: Cardiovascular;  Laterality: N/A;    Allergies  Allergen Reactions   Apresoline  [Hydralazine ] Other (See Comments)    Chest pain if by mouth- can tolerate it via IV   Imdur  [Isosorbide  Nitrate] Other (See Comments)    Headaches   Ms Contin  [Morphine ] Itching    Current Outpatient Medications  Medication Sig Dispense Refill   amLODipine  (NORVASC ) 10 MG tablet Take 1 tablet (10 mg total) by mouth daily. 180 tablet 1   Blood Glucose Monitoring Suppl DEVI Check glucose three times daily May substitute to any manufacturer covered by patient's  insurance. 1 each 0   Glucose Blood (BLOOD GLUCOSE TEST STRIPS) STRP Check glucose three times daily May substitute to any manufacturer covered by patient's insurance. 100 each 5   insulin  aspart (NOVOLOG  FLEXPEN) 100 UNIT/ML FlexPen Inject 0-25 Units into the skin 3 (three) times daily as needed (blood sugar > 200). 15 mL 2   Lancet Device MISC Check blood glucose three times daily May substitute to any manufacturer covered by patient's insurance. 1 each 0   Lancets (ACCU-CHEK SOFT TOUCH) lancets Use as instructed (Patient taking differently: 1 each by Other route as directed.) 100 each 12   Lancets Misc. MISC Check glucose three times daily May substitute to any manufacturer covered by patient's insurance. 100 each 5   mupirocin  cream (BACTROBAN ) 2 % Apply 1 Application topically 2 (two) times daily. 15 g 0   naproxen  sodium (ALEVE ) 220 MG tablet Take 440 mg by mouth 2 (two) times daily as needed (pain).     sertraline  (ZOLOFT ) 100 MG tablet Take 1 tablet (100 mg total) by mouth daily. 90 tablet 0   sevelamer  carbonate (RENVELA ) 800 MG tablet Take 1,600 mg by mouth 3 (three) times daily with meals. (Patient not taking: Reported on 04/03/2024)     No current facility-administered medications for this visit.    Family History  Problem Relation Age of Onset   Pneumonia Mother    Breast cancer Mother    Diabetes Father    Heart disease Father    Breast cancer Sister    Prostate cancer Other    Stomach cancer Other    Colon cancer Neg Hx    Esophageal cancer Neg Hx    Rectal cancer Neg Hx     Social History   Socioeconomic History   Marital status: Single    Spouse name: Not on file   Number of children: 2   Years of education: college   Highest education level: Not on file  Occupational History   Occupation: disabled  Tobacco Use   Smoking status: Never    Passive exposure: Never   Smokeless tobacco: Never  Vaping Use   Vaping status: Never Used  Substance and Sexual Activity    Alcohol use: Not Currently    Comment: rarely   Drug use: No   Sexual activity: Never  Other Topics Concern   Not on file  Social History Narrative   Patient lives alone.   Education. Two years of college.   Not working.   Right handed.   Caffeine one soda daily. Drinks coffee    Social Drivers of Corporate investment banker Strain: Low Risk  (  08/15/2023)   Overall Financial Resource Strain (CARDIA)    Difficulty of Paying Living Expenses: Not hard at all  Food Insecurity: Patient Declined (04/04/2024)   Hunger Vital Sign    Worried About Running Out of Food in the Last Year: Patient declined    Ran Out of Food in the Last Year: Patient declined  Transportation Needs: Patient Declined (04/04/2024)   PRAPARE - Administrator, Civil Service (Medical): Patient declined    Lack of Transportation (Non-Medical): Patient declined  Physical Activity: Sufficiently Active (08/15/2023)   Exercise Vital Sign    Days of Exercise per Week: 3 days    Minutes of Exercise per Session: 60 min  Stress: No Stress Concern Present (08/15/2023)   [employer] of Occupational Health - Occupational Stress Questionnaire    Feeling of Stress : Not at all  Social Connections: Patient Declined (04/04/2024)   Social Connection and Isolation Panel    Frequency of Communication with Friends and Family: Patient declined    Frequency of Social Gatherings with Friends and Family: Patient declined    Attends Religious Services: Patient declined    Database administrator or Organizations: Patient declined    Attends Banker Meetings: Patient declined    Marital Status: Patient declined  Intimate Partner Violence: Patient Declined (04/04/2024)   Humiliation, Afraid, Rape, and Kick questionnaire    Fear of Current or Ex-Partner: Patient declined    Emotionally Abused: Patient declined    Physically Abused: Patient declined    Sexually Abused: Patient declined     ROS: [x]  Positive   [  ] Negative   [ ]  All sytems reviewed and are negative  Cardiac: []  chest pain/pressure []  SOB/DOE  Vascular: []  pain in legs while walking []  pain in feet when lying flat []  swelling in legs  Pulmonary: [x]  OSA  Neurologic: [x]  hx CVA/TIA  Hematologic: [x]  anemia [x]  renal cancer  GI []  GERD  GU: [x]  CKD/renal failure  [x]  HD---[]  M/W/F [x]  T/T/S  Psychiatric: []  hx of major depression  Integumentary: []  rashes []  ulcers  Constitutional: []  fever []  chills   PHYSICAL EXAMINATION:  Today's Vitals   05/27/24 1217  BP: 127/82  Pulse: 72  Temp: 98.1 F (36.7 C)  TempSrc: Temporal  SpO2: 91%  Weight: 214 lb 3.2 oz (97.2 kg)  Height: 6' (1.829 m)  PainSc: 5   PainLoc: Arm   Body mass index is 29.05 kg/m.    General:  WDWN male in NAD Gait: Not observed HENT: WNL Pulmonary: normal non-labored breathing  Cardiac: regular, without carotid bruits Skin: without rashes Vascular Exam/Pulses:   Right Left  Radial 2+ (normal) 2+ (normal)   Extremities:  + thrill in LUA AVF.  Somewhat pulsatile proximally; keloid over fistula from multiple sticks.  Musculoskeletal: no muscle wasting or atrophy  Neurologic: A&O X 3  Non-Invasive Vascular Imaging:   Dialysis duplex on 05/27/2024: Fistula is patent.  There is elevated velocities in the mid upper arm .  Fistulogram with IR 03/29/2024: IMPRESSION: 1. Left upper extremity AV fistula is patent. No significant stenosis. 2. Two large branch vessels coming off the left cephalic vein could be causing competitive flow.   ASSESSMENT/PLAN: 63 y.o. male with ESRD here for evaluation of his hemodialysis access with hx of left BC AVF   -AVF infiltrated and had TDC placed by Dr. Marea -pt is on dialysis on T/T/S at [facility] location in [place] -duplex today reveals elevated velocities in the  mid uppper arm.  Discussed with pt and significant other we would proceed with fistulogram to see if any intervention could help  his fistula.  He does have a couple of competing branches that may need ligation pending results of fistulogram.  Discussed with them that even with the above, it may not work and he may need new access.  His significant other expressed good understanding.  -discussed with pt that access does not last forever and will need intervention or even new access at some point.  -pt is right hand dominant -pt is not on anticoagulation   Kienna Moncada, [facility] [phone]  Clinic MD:   Serene

## 2024-06-03 ENCOUNTER — Other Ambulatory Visit: Payer: Self-pay

## 2024-06-03 ENCOUNTER — Encounter (HOSPITAL_COMMUNITY)
Admission: RE | Disposition: A | Discharge status: Home / Self Care | Payer: Self-pay | Source: Ambulatory Visit | Attending: Vascular Surgery

## 2024-06-03 ENCOUNTER — Encounter (HOSPITAL_COMMUNITY): Payer: Self-pay | Admitting: Vascular Surgery

## 2024-06-03 ENCOUNTER — Ambulatory Visit (HOSPITAL_COMMUNITY)
Admission: RE | Admit: 2024-06-03 | Discharge: 2024-06-03 | Disposition: A | Discharge status: Home / Self Care | Source: Ambulatory Visit | Attending: Vascular Surgery | Admitting: Vascular Surgery

## 2024-06-03 DIAGNOSIS — E1122 Type 2 diabetes mellitus with diabetic chronic kidney disease: Secondary | ICD-10-CM | POA: Insufficient documentation

## 2024-06-03 DIAGNOSIS — Z992 Dependence on renal dialysis: Secondary | ICD-10-CM | POA: Insufficient documentation

## 2024-06-03 DIAGNOSIS — T82858A Stenosis of vascular prosthetic devices, implants and grafts, initial encounter: Secondary | ICD-10-CM | POA: Diagnosis present

## 2024-06-03 DIAGNOSIS — N186 End stage renal disease: Secondary | ICD-10-CM | POA: Diagnosis not present

## 2024-06-03 DIAGNOSIS — I120 Hypertensive chronic kidney disease with stage 5 chronic kidney disease or end stage renal disease: Secondary | ICD-10-CM | POA: Diagnosis not present

## 2024-06-03 DIAGNOSIS — Y832 Surgical operation with anastomosis, bypass or graft as the cause of abnormal reaction of the patient, or of later complication, without mention of misadventure at the time of the procedure: Secondary | ICD-10-CM | POA: Insufficient documentation

## 2024-06-03 HISTORY — PX: A/V SHUNT INTERVENTION: CATH118220

## 2024-06-03 SURGERY — A/V SHUNT INTERVENTION
Anesthesia: LOCAL | Laterality: Left

## 2024-06-03 MED ORDER — HEPARIN (PORCINE) IN NACL 1000-0.9 UT/500ML-% IV SOLN
INTRAVENOUS | Status: DC | PRN
Start: 2024-06-03 — End: 2024-06-03
  Administered 2024-06-03: 500 mL

## 2024-06-03 MED ORDER — IODIXANOL 320 MG/ML IV SOLN
INTRAVENOUS | Status: DC | PRN
  Administered 2024-06-03: 25 mL via INTRAVENOUS

## 2024-06-03 MED ORDER — LIDOCAINE HCL (PF) 1 % IJ SOLN
INTRAMUSCULAR | Status: DC | PRN
  Administered 2024-06-03: 5 mL

## 2024-06-03 MED ORDER — LIDOCAINE HCL (PF) 1 % IJ SOLN
INTRAMUSCULAR | Status: AC
  Filled 2024-06-03: qty 30

## 2024-06-03 SURGICAL SUPPLY — 9 items
BALLOON MUSTANG 7X80X75 (BALLOONS) IMPLANT
CATH ANGIO 5F BER2 65CM (CATHETERS) IMPLANT
KIT ENCORE 26 ADVANTAGE (KITS) IMPLANT
KIT MICROPUNCTURE NIT STIFF (SHEATH) IMPLANT
SHEATH PINNACLE R/O II 6F 4CM (SHEATH) IMPLANT
SHEATH PROBE COVER 6X72 (BAG) IMPLANT
TRAY PV CATH (CUSTOM PROCEDURE TRAY) ×1 IMPLANT
TUBING CIL FLEX 10 FLL-RA (TUBING) IMPLANT
WIRE BENTSON .035X145CM (WIRE) IMPLANT

## 2024-06-03 NOTE — Op Note (Signed)
 DATE OF SERVICE: 06/03/2024  PATIENT:  Brandon [name]  63 y.o. male  PRE-OPERATIVE DIAGNOSIS:  ESRD  POST-OPERATIVE DIAGNOSIS:  Same  PROCEDURE:   1) ultrasound guided left brachiocephalic AVF access (CPT [phone]) 2) left arm fistulagram with peripheral angioplasty (CPT [phone])  SURGEON:  Surgeons and Role:    * Magda Debby SAILOR, MD - Primary  ASSISTANT: none  ANESTHESIA:   local  EBL: minimal  BLOOD ADMINISTERED:none  DRAINS: none   LOCAL MEDICATIONS USED:  LIDOCAINE    SPECIMEN:  none  COUNTS: confirmed correct.  TOURNIQUET:  none  PATIENT DISPOSITION:  PACU - hemodynamically stable.   Delay start of Pharmacological VTE agent (>24hrs) due to surgical blood loss or risk of bleeding: no  INDICATION FOR PROCEDURE: LORIE MELICHAR is a 63 y.o. male with ESRD, he has a left arm BC AVF that has not been reliably used. After careful discussion of risks, benefits, and alternatives the patient was offered fistulagram. The patient understood and wished to proceed.  OPERATIVE FINDINGS: no central venous stenosis; Left tunneled dialysis catheter in place. No subclavian vein stensosis; 60% left cephalic arch stenosis; two large competing branches in the [facility] AVF. No AVF stenosis in the arm. No perianastomotic stenosis.  DESCRIPTION OF PROCEDURE: After identification of the patient in the pre-operative holding area, the patient was transferred to the operating room. The patient was positioned supine on the operating room table. Anesthesia was induced. The left arm was prepped and draped in standard fashion. A surgical pause was performed confirming correct patient, procedure, and operative location.  Using ultrasound guidance the fistula in the left arm was evaluated.  1% lidocaine  was used to anesthetize the area of access.  Ultrasound guidance was used to guide needle access into the fistula.  Using Seldinger technique, micro sheath was introduced into the fistula.  Sheath injection  was used to perform fistulogram in stations.  See above for details.  Access was upsized to a 6 [place] sheath.  A Bentson wire and Berenstein catheter were used to cross the cephalic arch stenosis.  Angioplasty was performed with a 7 x 80 mm Mustang balloon.  Good technical result was achieved with improvement in stenosis.  All endovascular equipment was removed.  A figure-of-eight stitch was applied to the access site with good hemostasis.  A bandage was applied.  Upon completion of the case instrument and sharps counts were confirmed correct. The patient was transferred to the PACU in good condition. I was present for all portions of the procedure.  FOLLOW UP PLAN: plan AVF revision in OR.   Debby SAILOR. Magda, MD [facility] Phone Number: [phone] 06/03/2024 8:14 AM

## 2024-06-03 NOTE — Interval H&P Note (Signed)
 History and Physical Interval Note:  06/03/2024 8:14 AM  Brandon [name]  has presented today for surgery, with the diagnosis of N18.6.  The various methods of treatment have been discussed with the patient and family. After consideration of risks, benefits and other options for treatment, the patient has consented to  Procedure(s): A/V SHUNT INTERVENTION (Left) as a surgical intervention.  The patient's history has been reviewed, patient examined, no change in status, stable for surgery.  I have reviewed the patient's chart and labs.  Questions were answered to the patient's satisfaction.     Debby LOISE Robertson

## 2024-06-04 ENCOUNTER — Encounter (HOSPITAL_COMMUNITY): Payer: Self-pay

## 2024-06-11 ENCOUNTER — Encounter (HOSPITAL_COMMUNITY): Payer: Self-pay | Admitting: Vascular Surgery

## 2024-06-11 ENCOUNTER — Other Ambulatory Visit: Payer: Self-pay

## 2024-06-11 ENCOUNTER — Encounter: Payer: Self-pay | Admitting: Nephrology

## 2024-06-11 NOTE — Progress Notes (Addendum)
 PCP - Dr Toribio Slain Cardiologist - none Nephrology - Dr Marcelino  Chest x-ray - 04/04/24 EKG - 04/05/24 Stress Test - n/a ECHO - 10/03/22 Cardiac Cath - n/a  ICD Pacemaker/Loop - n/a  Sleep Study -  Yes, 02/2017 CPAP - does not use CPAP  Diabetes Type  2, does not  check blood sugar, no machine.   Do not take Novolog  Insulin  on the morning of surgery unless your CBG is > than 220 mg/dL.  If your CBG is greater than 220 mg/dL, you may take  of your sliding scale (correction) dose of insulin .  Aspirin  & Blood Thinner Instructions:  n/a  NPO  Anesthesia review: Yes  STOP now taking any Aspirin  (unless otherwise instructed by your surgeon), Aleve , Naproxen , Ibuprofen , Motrin , Advil , Goody's, BC's, all herbal medications, fish oil, and all vitamins.   Coronavirus Screening Does the patient have any of the following symptoms:  Cough yes/no: No Fever (>100.6F)  yes/no: No Runny nose yes/no: No Sore throat yes/no: No Difficulty breathing/shortness of breath  yes/no: No  Has the patient traveled in the last 14 days and where? yes/no: No  SO Brandon [name] verbalized understanding of instructions that were given via phone.

## 2024-06-12 ENCOUNTER — Encounter (HOSPITAL_COMMUNITY): Payer: Self-pay | Admitting: Vascular Surgery

## 2024-06-12 ENCOUNTER — Encounter (HOSPITAL_COMMUNITY): Admitting: Physician Assistant

## 2024-06-12 ENCOUNTER — Encounter (HOSPITAL_COMMUNITY): Payer: Self-pay

## 2024-06-12 ENCOUNTER — Encounter (HOSPITAL_COMMUNITY)
Admission: RE | Disposition: A | Discharge status: Home / Self Care | Payer: Self-pay | Source: Ambulatory Visit | Attending: Vascular Surgery

## 2024-06-12 ENCOUNTER — Other Ambulatory Visit: Payer: Self-pay

## 2024-06-12 ENCOUNTER — Ambulatory Visit (HOSPITAL_BASED_OUTPATIENT_CLINIC_OR_DEPARTMENT_OTHER): Admitting: Physician Assistant

## 2024-06-12 ENCOUNTER — Ambulatory Visit (HOSPITAL_COMMUNITY)
Admission: RE | Admit: 2024-06-12 | Discharge: 2024-06-12 | Disposition: A | Discharge status: Home / Self Care | Source: Ambulatory Visit | Attending: Vascular Surgery | Admitting: Vascular Surgery

## 2024-06-12 ENCOUNTER — Other Ambulatory Visit (HOSPITAL_COMMUNITY): Payer: Self-pay

## 2024-06-12 DIAGNOSIS — I120 Hypertensive chronic kidney disease with stage 5 chronic kidney disease or end stage renal disease: Secondary | ICD-10-CM | POA: Insufficient documentation

## 2024-06-12 DIAGNOSIS — F319 Bipolar disorder, unspecified: Secondary | ICD-10-CM | POA: Diagnosis not present

## 2024-06-12 DIAGNOSIS — M1990 Unspecified osteoarthritis, unspecified site: Secondary | ICD-10-CM | POA: Insufficient documentation

## 2024-06-12 DIAGNOSIS — T82590A Other mechanical complication of surgically created arteriovenous fistula, initial encounter: Secondary | ICD-10-CM | POA: Diagnosis not present

## 2024-06-12 DIAGNOSIS — E1122 Type 2 diabetes mellitus with diabetic chronic kidney disease: Secondary | ICD-10-CM | POA: Diagnosis not present

## 2024-06-12 DIAGNOSIS — Z992 Dependence on renal dialysis: Secondary | ICD-10-CM | POA: Diagnosis not present

## 2024-06-12 DIAGNOSIS — N186 End stage renal disease: Secondary | ICD-10-CM | POA: Diagnosis present

## 2024-06-12 DIAGNOSIS — Z8673 Personal history of transient ischemic attack (TIA), and cerebral infarction without residual deficits: Secondary | ICD-10-CM | POA: Insufficient documentation

## 2024-06-12 DIAGNOSIS — I132 Hypertensive heart and chronic kidney disease with heart failure and with stage 5 chronic kidney disease, or end stage renal disease: Secondary | ICD-10-CM

## 2024-06-12 DIAGNOSIS — Z85528 Personal history of other malignant neoplasm of kidney: Secondary | ICD-10-CM | POA: Diagnosis not present

## 2024-06-12 DIAGNOSIS — G4733 Obstructive sleep apnea (adult) (pediatric): Secondary | ICD-10-CM | POA: Insufficient documentation

## 2024-06-12 DIAGNOSIS — I5033 Acute on chronic diastolic (congestive) heart failure: Secondary | ICD-10-CM | POA: Diagnosis not present

## 2024-06-12 DIAGNOSIS — F419 Anxiety disorder, unspecified: Secondary | ICD-10-CM | POA: Insufficient documentation

## 2024-06-12 HISTORY — DX: Anemia, unspecified: D64.9

## 2024-06-12 HISTORY — DX: Heart failure, unspecified: I50.9

## 2024-06-12 HISTORY — PX: REVISON OF ARTERIOVENOUS FISTULA: SHX6074

## 2024-06-12 HISTORY — DX: Bipolar disorder, unspecified: F31.9

## 2024-06-12 HISTORY — DX: Autistic disorder: F84.0

## 2024-06-12 LAB — POCT I-STAT, CHEM 8
BUN: 68 mg/dL — ABNORMAL HIGH (ref 8–23)
Calcium, Ion: 0.78 mmol/L — CL (ref 1.15–1.40)
Chloride: 100 mmol/L (ref 98–111)
Creatinine, Ser: 14.1 mg/dL — ABNORMAL HIGH (ref 0.61–1.24)
Glucose, Bld: 154 mg/dL — ABNORMAL HIGH (ref 70–99)
HCT: 39 % (ref 39.0–52.0)
Hemoglobin: 13.3 g/dL (ref 13.0–17.0)
Potassium: 5.6 mmol/L — ABNORMAL HIGH (ref 3.5–5.1)
Sodium: 137 mmol/L (ref 135–145)
TCO2: 26 mmol/L (ref 22–32)

## 2024-06-12 LAB — GLUCOSE, CAPILLARY
Glucose-Capillary: 159 mg/dL — ABNORMAL HIGH (ref 70–99)
Glucose-Capillary: 143 mg/dL — ABNORMAL HIGH (ref 70–99)

## 2024-06-12 SURGERY — REVISON OF ARTERIOVENOUS FISTULA
Anesthesia: Monitor Anesthesia Care | Laterality: Left

## 2024-06-12 MED ORDER — CEFAZOLIN SODIUM-DEXTROSE 2-4 GM/100ML-% IV SOLN
2.0000 g | INTRAVENOUS | Status: AC
  Administered 2024-06-12: 2 g via INTRAVENOUS
  Filled 2024-06-12: qty 100

## 2024-06-12 MED ORDER — PHENYLEPHRINE HCL-NACL 20-0.9 MG/250ML-% IV SOLN
INTRAVENOUS | Status: DC | PRN
  Administered 2024-06-12: 20 ug/min via INTRAVENOUS

## 2024-06-12 MED ORDER — LIDOCAINE-EPINEPHRINE (PF) 1 %-1:200000 IJ SOLN
INTRAMUSCULAR | Status: DC | PRN
  Administered 2024-06-12: 10 mL
  Administered 2024-06-12: 7 mL

## 2024-06-12 MED ORDER — ONDANSETRON HCL 4 MG/2ML IJ SOLN
INTRAMUSCULAR | Status: DC | PRN
  Administered 2024-06-12: 4 mg via INTRAVENOUS

## 2024-06-12 MED ORDER — ORAL CARE MOUTH RINSE: 15.0000 mL | Freq: Once | OROMUCOSAL | Status: AC

## 2024-06-12 MED ORDER — CHLORHEXIDINE GLUCONATE 4 % EX SOLN: 60.0000 mL | Freq: Once | CUTANEOUS | Status: DC

## 2024-06-12 MED ORDER — OXYCODONE HCL 5 MG PO TABS
5.0000 mg | ORAL_TABLET | Freq: Four times a day (QID) | ORAL | 0 refills | Status: DC | PRN
  Filled 2024-06-12: qty 12, 3d supply, fill #0

## 2024-06-12 MED ORDER — SODIUM CHLORIDE 0.9% FLUSH: 3.0000 mL | INTRAVENOUS | Status: DC | PRN

## 2024-06-12 MED ORDER — MIDAZOLAM HCL 2 MG/2ML IJ SOLN
INTRAMUSCULAR | Status: DC | PRN
  Administered 2024-06-12: 2 mg via INTRAVENOUS

## 2024-06-12 MED ORDER — PROPOFOL 10 MG/ML IV BOLUS
INTRAVENOUS | Status: DC | PRN
  Administered 2024-06-12: 30 mg via INTRAVENOUS

## 2024-06-12 MED ORDER — SODIUM CHLORIDE 0.9 % IV SOLN: INTRAVENOUS | Status: DC | PRN

## 2024-06-12 MED ORDER — HEPARIN 6000 UNIT IRRIGATION SOLUTION
Status: AC
  Filled 2024-06-12: qty 500

## 2024-06-12 MED ORDER — MIDAZOLAM HCL 2 MG/2ML IJ SOLN
INTRAMUSCULAR | Status: AC
  Filled 2024-06-12: qty 2

## 2024-06-12 MED ORDER — PHENYLEPHRINE 80 MCG/ML (10ML) SYRINGE FOR IV PUSH (FOR BLOOD PRESSURE SUPPORT)
PREFILLED_SYRINGE | INTRAVENOUS | Status: DC | PRN
  Administered 2024-06-12: 80 ug via INTRAVENOUS

## 2024-06-12 MED ORDER — CHLORHEXIDINE GLUCONATE 0.12 % MT SOLN
15.0000 mL | Freq: Once | OROMUCOSAL | Status: AC
Start: 2024-06-12 — End: 2024-06-12
  Administered 2024-06-12: 15 mL via OROMUCOSAL
  Filled 2024-06-12: qty 15

## 2024-06-12 MED ORDER — HEPARIN 6000 UNIT IRRIGATION SOLUTION
Status: DC | PRN
  Administered 2024-06-12: 1

## 2024-06-12 MED ORDER — OXYCODONE HCL 5 MG PO TABS
ORAL_TABLET | ORAL | Status: AC
  Filled 2024-06-12: qty 1

## 2024-06-12 MED ORDER — OXYCODONE HCL 5 MG PO TABS
5.0000 mg | ORAL_TABLET | Freq: Once | ORAL | Status: AC
  Administered 2024-06-12: 5 mg via ORAL

## 2024-06-12 MED ORDER — LIDOCAINE-EPINEPHRINE (PF) 1 %-1:200000 IJ SOLN
INTRAMUSCULAR | Status: AC
  Filled 2024-06-12: qty 30

## 2024-06-12 MED ORDER — HEPARIN SODIUM (PORCINE) 1000 UNIT/ML IJ SOLN
INTRAMUSCULAR | Status: AC
  Filled 2024-06-12: qty 10

## 2024-06-12 MED ORDER — INSULIN ASPART 100 UNIT/ML IJ SOLN: 0.0000 [IU] | INTRAMUSCULAR | Status: DC | PRN

## 2024-06-12 MED ORDER — PROPOFOL 500 MG/50ML IV EMUL
INTRAVENOUS | Status: DC | PRN
  Administered 2024-06-12: 85 ug/kg/min via INTRAVENOUS
  Administered 2024-06-12: 100 ug/kg/min via INTRAVENOUS

## 2024-06-12 MED ORDER — 0.9 % SODIUM CHLORIDE (POUR BTL) OPTIME
TOPICAL | Status: DC | PRN
  Administered 2024-06-12: 1000 mL

## 2024-06-12 SURGICAL SUPPLY — 31 items
ARMBAND PINK RESTRICT EXTREMIT (MISCELLANEOUS) ×1 IMPLANT
BAG COUNTER SPONGE SURGICOUNT (BAG) ×1 IMPLANT
BENZOIN TINCTURE PRP APPL 2/3 (GAUZE/BANDAGES/DRESSINGS) ×1 IMPLANT
CANISTER SUCTION 3000ML PPV (SUCTIONS) ×1 IMPLANT
CANNULA VESSEL 3MM 2 BLNT TIP (CANNULA) ×1 IMPLANT
CHLORAPREP W/TINT 26 (MISCELLANEOUS) ×1 IMPLANT
CLIP LIGATING EXTRA MED SLVR (CLIP) IMPLANT
CLIP LIGATING EXTRA SM BLUE (MISCELLANEOUS) IMPLANT
COVER PROBE W GEL 5X96 (DRAPES) IMPLANT
ELECTRODE REM PT RTRN 9FT ADLT (ELECTROSURGICAL) ×1 IMPLANT
GLOVE BIO SURGEON STRL SZ8 (GLOVE) ×1 IMPLANT
GOWN STRL REUS W/ TWL LRG LVL3 (GOWN DISPOSABLE) ×2 IMPLANT
GOWN STRL REUS W/ TWL XL LVL3 (GOWN DISPOSABLE) ×1 IMPLANT
INSERT FOGARTY SM (MISCELLANEOUS) IMPLANT
KIT BASIN OR (CUSTOM PROCEDURE TRAY) ×1 IMPLANT
KIT TURNOVER KIT B (KITS) ×1 IMPLANT
LOOP VESSEL MINI RED (MISCELLANEOUS) IMPLANT
NDL 18GX1X1/2 (RX/OR ONLY) (NEEDLE) ×1 IMPLANT
NEEDLE 18GX1X1/2 (RX/OR ONLY) (NEEDLE) ×1 IMPLANT
NS IRRIG 1000ML POUR BTL (IV SOLUTION) ×1 IMPLANT
PACK CV ACCESS (CUSTOM PROCEDURE TRAY) ×1 IMPLANT
PAD ARMBOARD POSITIONER FOAM (MISCELLANEOUS) ×2 IMPLANT
SLING ARM FOAM STRAP LRG (SOFTGOODS) IMPLANT
STRIP CLOSURE SKIN 1/2X4 (GAUZE/BANDAGES/DRESSINGS) ×1 IMPLANT
SUT MNCRL AB 4-0 PS2 18 (SUTURE) ×1 IMPLANT
SUT PROLENE 6 0 BV (SUTURE) ×1 IMPLANT
SUT VIC AB 3-0 SH 27X BRD (SUTURE) ×1 IMPLANT
SYR 3ML LL SCALE MARK (SYRINGE) ×1 IMPLANT
TOWEL GREEN STERILE (TOWEL DISPOSABLE) ×1 IMPLANT
UNDERPAD 30X36 HEAVY ABSORB (UNDERPADS AND DIAPERS) ×1 IMPLANT
WATER STERILE IRR 1000ML POUR (IV SOLUTION) ×1 IMPLANT

## 2024-06-12 NOTE — Anesthesia Postprocedure Evaluation (Signed)
 Anesthesia Post Note  Patient: Brandon [name]  Procedure(s) Performed: REVISON OF ARTERIOVENOUS FISTULA (Left)     Patient location during evaluation: PACU Anesthesia Type: MAC Level of consciousness: awake and alert Pain management: pain level controlled Vital Signs Assessment: post-procedure vital signs reviewed and stable Respiratory status: spontaneous breathing, nonlabored ventilation, respiratory function stable and patient connected to nasal cannula oxygen Cardiovascular status: stable and blood pressure returned to baseline Postop Assessment: no apparent nausea or vomiting Anesthetic complications: no   No notable events documented.  Last Vitals:  Vitals:   06/12/24 1145 06/12/24 1200  BP: (!) 143/74 (!) 159/84  Pulse: 64 65  Resp: 16 16  Temp:  (!) 36.3 C  SpO2: 100% 100%    Last Pain:  Vitals:   06/12/24 1218  TempSrc:   PainSc: 5                  Jeydi Klingel

## 2024-06-12 NOTE — Discharge Instructions (Signed)
   [facility]  Discharge Instructions  AV Fistula or Graft Surgery for Dialysis Access  Please refer to the following instructions for your post-procedure care. Your surgeon or physician assistant will discuss any changes with you.  Activity  You may drive the day following your surgery, if you are comfortable and no longer taking prescription pain medication. Resume full activity as the soreness in your incision resolves.  Bathing/Showering  You may shower after you go home. Keep your incision dry for 48 hours. Do not soak in a bathtub, hot tub, or swim until the incision heals completely. You may not shower if you have a hemodialysis catheter.  Incision Care  Clean your incision with mild soap and water  after 48 hours. Pat the area dry with a clean towel. You do not need a bandage unless otherwise instructed. Do not apply any ointments or creams to your incision. You may have skin glue on your incision. Do not peel it off. It will come off on its own in about one week. Your arm may swell a bit after surgery. To reduce swelling use pillows to elevate your arm so it is above your heart. Your doctor will tell you if you need to lightly wrap your arm with an ACE bandage.  Diet  Resume your normal diet. There are not special food restrictions following this procedure. In order to heal from your surgery, it is CRITICAL to get adequate nutrition. Your body requires vitamins, minerals, and protein. Vegetables are the best source of vitamins and minerals. Vegetables also provide the perfect balance of protein. Processed food has little nutritional value, so try to avoid this.  Medications  Resume taking all of your medications. If your incision is causing pain, you may take over-the counter pain relievers such as acetaminophen  (Tylenol ). If you were prescribed a stronger pain medication, please be aware these medications can cause nausea and constipation. Prevent  nausea by taking the medication with a snack or meal. Avoid constipation by drinking plenty of fluids and eating foods with high amount of fiber, such as fruits, vegetables, and grains.  Do not take Tylenol  if you are taking prescription pain medications.  Follow up Your surgeon may want to see you in the office following your access surgery. If so, this will be arranged at the time of your surgery.  Please call us  immediately for any of the following conditions:  Increased pain, redness, drainage (pus) from your incision site Fever of 101 degrees or higher Severe or worsening pain at your incision site Hand pain or numbness.  Reduce your risk of vascular disease:  Stop smoking. If you would like help, call [facility] at 1-800-QUIT-NOW (3010389527) or [place] at (684) 015-2492  Manage your cholesterol Maintain a desired weight Control your diabetes Keep your blood pressure down  Dialysis  It will take several weeks to several months for your new dialysis access to be ready for use. Your surgeon will determine when it is okay to use it. Your nephrologist will continue to direct your dialysis. You can continue to use your Permcath until your new access is ready for use.   06/12/2024 Brandon [name] [id number] 1961/04/23  Surgeon(s): Magda Debby SAILOR, MD  Procedure(s): REVISON OF ARTERIOVENOUS FISTULA   May stick graft immediately   May stick graft on designated area only:   x Do not stick fistula for 8 weeks    If you have any questions, please call the office at [phone].

## 2024-06-12 NOTE — Transfer of Care (Signed)
 Immediate Anesthesia Transfer of Care Note  Patient: Brandon [name]  Procedure(s) Performed: REVISON OF ARTERIOVENOUS FISTULA (Left)  Patient Location: PACU  Anesthesia Type:MAC  Level of Consciousness: awake and drowsy  Airway & Oxygen Therapy: Patient Spontanous Breathing and Patient connected to face mask oxygen  Post-op Assessment: Report given to RN and Post -op Vital signs reviewed and stable  Post vital signs: Reviewed and stable  Last Vitals:  Vitals Value Taken Time  BP    Temp    Pulse    Resp    SpO2      Last Pain:  Vitals:   06/12/24 1001  TempSrc:   PainSc: 0-No pain         Complications: No notable events documented.

## 2024-06-12 NOTE — Interval H&P Note (Signed)
 History and Physical Interval Note:  06/12/2024 9:39 AM  Brandon [name]  has presented today for surgery, with the diagnosis of ESRD.  The various methods of treatment have been discussed with the patient and family. After consideration of risks, benefits and other options for treatment, the patient has consented to  Procedure(s): REVISON OF ARTERIOVENOUS FISTULA (Left) as a surgical intervention.  The patient's history has been reviewed, patient examined, no change in status, stable for surgery.  I have reviewed the patient's chart and labs.  Questions were answered to the patient's satisfaction.     Debby LOISE Robertson

## 2024-06-12 NOTE — Op Note (Signed)
 DATE OF SERVICE: 06/12/2024  PATIENT:  Brandon [name]  63 y.o. male  PRE-OPERATIVE DIAGNOSIS:  ESRD  POST-OPERATIVE DIAGNOSIS:  Same  PROCEDURE:   left arm AV fistula revision with sidebranch ligation x 2 (CPT [phone])  SURGEON:  Surgeons and Role:    * Magda Debby SAILOR, MD - Primary  ASSISTANT: Ahmed Holster, PA-C  An experienced assistant was required given the complexity of this procedure and the standard of surgical care. My assistant helped with exposure through counter tension, suctioning, ligation and retraction to better visualize the surgical field.  My assistant expedited sewing during the case by following my sutures. Wherever I use the term we in the report, my assistant actively helped me with that portion of the procedure.  ANESTHESIA:   local and MAC  EBL: minimal  BLOOD ADMINISTERED:none  DRAINS: none   LOCAL MEDICATIONS USED:  LIDOCAINE    SPECIMEN:  none  COUNTS: confirmed correct.  TOURNIQUET:  none  PATIENT DISPOSITION:  PACU - hemodynamically stable.   Delay start of Pharmacological VTE agent (>24hrs) due to surgical blood loss or risk of bleeding: no  INDICATION FOR PROCEDURE: Brandon [name] is a 63 y.o. male with ESRD with left arm BC AVF that has been difficult to use. Fistulagram showed two large sidebranches. After careful discussion of risks, benefits, and alternatives the patient was offered sidebranch ligation. The patient understood and wished to proceed.  OPERATIVE FINDINGS: two large sidebranches successfully ligated.   DESCRIPTION OF PROCEDURE: After identification of the patient in the pre-operative holding area, the patient was transferred to the operating room. The patient was positioned supine on the operating room table. Anesthesia was induced. The left arm was prepped and draped in standard fashion. A surgical pause was performed confirming correct patient, procedure, and operative location.  Intraoperative ultrasound was used to  identify 2 large sidebranches in the left arm brachiocephalic AV fistula.  Longitudinal incisions were made over the sidebranches and carried down through the subcutaneous tissue using Bovie electrocautery.  The sidebranches were encircled with 2-0 silk suture and divided between ligature.  Strong thrill was noted throughout the fistula after these sidebranches were ligated.  The wounds were closed in layers using 3-0 Vicryl and 4-0 Monocryl.  Upon completion of the case instrument and sharps counts were confirmed correct. The patient was transferred to the PACU in good condition. I was present for all portions of the procedure.  FOLLOW UP PLAN: Follow-up in 6 weeks with [facility] PA with AV fistula duplex  Debby SAILOR. Magda, MD [facility] Phone Number: [phone] 06/12/2024 11:06 AM

## 2024-06-12 NOTE — Anesthesia Preprocedure Evaluation (Addendum)
 Anesthesia Evaluation  Patient identified by MRN, date of birth, ID band Patient awake    Reviewed: Allergy & Precautions, NPO status , Patient's Chart, lab work & pertinent test results  History of Anesthesia Complications Negative for: history of anesthetic complications  Airway Mallampati: II  TM Distance: >3 FB Neck ROM: Full    Dental  (+) Dental Advisory Given, Edentulous Upper,    Pulmonary sleep apnea    Pulmonary exam normal        Cardiovascular hypertension, +CHF  Normal cardiovascular exam     Neuro/Psych Seizures -,  PSYCHIATRIC DISORDERS Anxiety Depression Bipolar Disorder    Neuromuscular disease CVA    GI/Hepatic Neg liver ROS, hiatal hernia,GERD  ,,  Endo/Other  diabetes    Renal/GU ESRFRenal disease  negative genitourinary   Musculoskeletal  (+) Arthritis ,    Abdominal   Peds  Hematology  (+) Blood dyscrasia, anemia   Anesthesia Other Findings   Reproductive/Obstetrics                              Anesthesia Physical Anesthesia Plan  ASA: 3  Anesthesia Plan: MAC   Post-op Pain Management: Minimal or no pain anticipated   Induction: Intravenous  PONV Risk Score and Plan: 2 and Ondansetron , Treatment may vary due to age or medical condition, Propofol  infusion and Midazolam   Airway Management Planned: Simple Face Mask and Nasal Cannula  Additional Equipment: None  Intra-op Plan:   Post-operative Plan: Extubation in OR  Informed Consent: I have reviewed the patients History and Physical, chart, labs and discussed the procedure including the risks, benefits and alternatives for the proposed anesthesia with the patient or authorized representative who has indicated his/her understanding and acceptance.     Dental advisory given  Plan Discussed with: Anesthesiologist and CRNA  Anesthesia Plan Comments:         Anesthesia Quick Evaluation

## 2024-06-13 ENCOUNTER — Encounter (HOSPITAL_COMMUNITY): Payer: Self-pay | Admitting: Vascular Surgery

## 2024-06-13 ENCOUNTER — Ambulatory Visit

## 2024-06-13 ENCOUNTER — Encounter (HOSPITAL_COMMUNITY)

## 2024-07-05 ENCOUNTER — Other Ambulatory Visit: Payer: Self-pay | Admitting: Vascular Surgery

## 2024-07-05 DIAGNOSIS — N186 End stage renal disease: Secondary | ICD-10-CM

## 2024-07-09 ENCOUNTER — Other Ambulatory Visit
Admission: RE | Admit: 2024-07-09 | Discharge: 2024-07-09 | Disposition: A | Discharge status: Home / Self Care | Source: Ambulatory Visit | Attending: Nephrology | Admitting: Nephrology

## 2024-07-09 DIAGNOSIS — N186 End stage renal disease: Secondary | ICD-10-CM | POA: Insufficient documentation

## 2024-07-09 LAB — POTASSIUM: Potassium: 6.5 mmol/L (ref 3.5–5.1)

## 2024-07-26 ENCOUNTER — Ambulatory Visit (INDEPENDENT_AMBULATORY_CARE_PROVIDER_SITE_OTHER): Admitting: Family Medicine

## 2024-07-26 ENCOUNTER — Encounter (HOSPITAL_COMMUNITY): Payer: Self-pay

## 2024-07-26 ENCOUNTER — Encounter: Payer: Self-pay | Admitting: Family Medicine

## 2024-07-26 VITALS — BP 113/76 | HR 81 | Ht 72.0 in | Wt 212.8 lb

## 2024-07-26 DIAGNOSIS — N186 End stage renal disease: Secondary | ICD-10-CM | POA: Diagnosis not present

## 2024-07-26 DIAGNOSIS — E1169 Type 2 diabetes mellitus with other specified complication: Secondary | ICD-10-CM

## 2024-07-26 DIAGNOSIS — K029 Dental caries, unspecified: Secondary | ICD-10-CM

## 2024-07-26 DIAGNOSIS — L97511 Non-pressure chronic ulcer of other part of right foot limited to breakdown of skin: Secondary | ICD-10-CM

## 2024-07-26 DIAGNOSIS — E785 Hyperlipidemia, unspecified: Secondary | ICD-10-CM

## 2024-07-26 DIAGNOSIS — E119 Type 2 diabetes mellitus without complications: Secondary | ICD-10-CM

## 2024-07-26 DIAGNOSIS — Z794 Long term (current) use of insulin: Secondary | ICD-10-CM

## 2024-07-26 DIAGNOSIS — N529 Male erectile dysfunction, unspecified: Secondary | ICD-10-CM

## 2024-07-26 LAB — POCT GLYCOSYLATED HEMOGLOBIN (HGB A1C): HbA1c POC (<> result, manual entry): 7 % (ref 4.0–5.6)

## 2024-07-26 NOTE — Patient Instructions (Signed)
 It was nice to see you today,  We addressed the following topics today: -I will send in a referral to the urologist for other options for erectile dysfunction - I will send in a referral to the dentist. - If you cannot find out who your podiatrist was who saw you last time let me know and I put in a new referral.  Otherwise you know who it is you can call them to schedule an appointment for your nails.   Have a great day,  Rolan Slain, MD

## 2024-07-26 NOTE — Assessment & Plan Note (Signed)
 A1c stable at 7.0. Using sliding scale short-acting insulin  3-4 times per week. Reports self-monitoring blood glucose. - Continue current insulin  regimen. - Educated on importance of professional nail trimming by a podiatrist to prevent foot injury and infection, given poor healing associated with diabetes and PVD. He has a history of self-inflicted foot injury. Will re-refer to a podiatrist if he cannot recall the previous one.

## 2024-07-26 NOTE — Assessment & Plan Note (Signed)
 Post-CVA erectile dysfunction. Patient requests oral medication. Has not tried any previous treatments. Due to ESRD on HD and episodes of symptomatic hypotension, oral PDE-5 inhibitors (e.g., Viagra , Cialis) are contraindicated due to his episodes of hypotension and ESRD.  Discussed alternative, lower-risk options. - Counselled on risks of oral ED medications with his comorbidities. - Refer to Urology to discuss non-systemic options including penile injections, shockwave therapy, and vacuum pump devices.

## 2024-07-26 NOTE — Assessment & Plan Note (Signed)
 Patient is on HD three times weekly. Reports fatigue, which is multifactorial. He sees the nephrologist (Dr. Marcelino) at the dialysis center but reports no appt's in between those times. AV fistula was recently revised, not yet in use; currently using a chest catheter. - Order labs today (CMP, CBC) to assess for anemia, BUN, electrolytes, as dialysis center labs are not available. - Will submit a records request to his dialysis center [facility] center) for recent labs and progress notes. - Discussed his interest in clinical trials for kidney disease and will keep it in mind.

## 2024-07-26 NOTE — Assessment & Plan Note (Signed)
 History of ulcer on the 5th metatarsal, plantar side, of the right foot, which has healed - Continue local wound care as needed. - Follow up with podiatry for ongoing foot and nail care.

## 2024-07-26 NOTE — Progress Notes (Signed)
 Established Patient Office Visit  Subjective   Patient ID: Brandon [name], male    DOB: 11/30/1961  Age: 63 y.o. MRN: [id number]  Chief Complaint  Patient presents with   Medical Management of Chronic Issues    HPI  Subjective - Follow-up for multiple issues. - Reports right foot ulcer is now healed. Was applying Neosporin. - Requests medication for erectile dysfunction, which he has experienced since his stroke. Reports difficulty achieving and maintaining an erection. Has not tried any treatments. - Reports fatigue, which he has had since his stroke and also attributes to ESRD and dialysis. - Follow-up for type 2 diabetes. A1C is 7, stable from last measurement. - Reports episodes of hypotension and vomiting, particularly after dialysis. - Asks for a referral to a dentist. - Asks about taking vitamins, specifically a [employer] vitality pack he took in the past. - Reports a history of high potassium. Acknowledges dietary restrictions including bananas and watermelon.  Medications Reports using short-acting insulin  pen, 25 units PRN for blood glucose >125, approximately 3-4 times per week. Takes it before meals. Does not take blood pressure medication due to hypotension. Uses topical Neosporin on foot.  PMH, PSH, FH, Social Hx PMHx: Type 2 diabetes mellitus, end-stage renal disease on hemodialysis (Tuesday, Thursday, Saturday), history of CVA (described as massive, with left-sided effects), history of kidney cancer (s/p removal), hypertension, hyperkalemia. PSH: AV fistula revision, currently has a chest catheter for dialysis access. History of kidney cancer surgery. Social Hx: Attends dialysis at a center in [place], may be transferring to a closer center in [place]. Inquired about clinical trials for kidney disease.  ROS Constitutional: Positive for fatigue. Denies fever, chills. Cardiovascular: Denies chest pain. Endocrine: Denies polyuria, polydipsia. GU: Reports erectile  dysfunction. Denies urinary symptoms. MSK: Denies joint pain or swelling. Neurologic: History of stroke with residual fatigue. Denies new focal deficits. Skin: Reports healed ulcer on right foot. No new rashes or lesions.    The ASCVD Risk score (Arnett DK, et al., 2019) failed to calculate for the following reasons:   Risk score cannot be calculated because patient has a medical history suggesting prior/existing ASCVD  Health Maintenance Due  Topic Date Due   Hepatitis C Screening  Never done   Zoster Vaccines- Shingrix (1 of 2) Never done   OPHTHALMOLOGY EXAM  04/27/2023   COVID-19 Vaccine (5 - 2024-25 season) 10/30/2023   INFLUENZA VACCINE  07/05/2024   Medicare Annual Wellness (AWV)  08/14/2024      Objective:     BP 113/76   Pulse 81   Ht 6' (1.829 m)   Wt 212 lb 12.8 oz (96.5 kg)   SpO2 100%   BMI 28.86 kg/m    Physical Exam Gen: alert, oriented.  Accompanied by partner Pulm: no resp distress Ext: ulceration over the right 5th metatarsal joint appears to be healing normally.  No other ulcerations noted.  Thickened discolored nails on all toes.    Results for orders placed or performed in visit on 07/26/24  POCT glycosylated hemoglobin (Hb A1C)  Result Value Ref Range   Hemoglobin A1C     HbA1c POC (<> result, manual entry) 7.0 4.0 - 5.6 %   HbA1c, POC (prediabetic range)     HbA1c, POC (controlled diabetic range)          Assessment & Plan:   Controlled type 2 diabetes mellitus without complication, without long-term current use of insulin  (HCC) Assessment & Plan: A1c stable at 7.0. Using sliding scale  short-acting insulin  3-4 times per week. Reports self-monitoring blood glucose. - Continue current insulin  regimen. - Educated on importance of professional nail trimming by a podiatrist to prevent foot injury and infection, given poor healing associated with diabetes and PVD. He has a history of self-inflicted foot injury. Will re-refer to a podiatrist if  he cannot recall the previous one.  Orders: -     POCT glycosylated hemoglobin (Hb A1C)  Hyperlipidemia associated with type 2 diabetes mellitus (HCC) -     POCT glycosylated hemoglobin (Hb A1C)  End stage renal disease (HCC) Assessment & Plan: Patient is on HD three times weekly. Reports fatigue, which is multifactorial. He sees the nephrologist (Dr. Marcelino) at the dialysis center but reports no appt's in between those times. AV fistula was recently revised, not yet in use; currently using a chest catheter. - Order labs today (CMP, CBC) to assess for anemia, BUN, electrolytes, as dialysis center labs are not available. - Will submit a records request to his dialysis center [facility] center) for recent labs and progress notes. - Discussed his interest in clinical trials for kidney disease and will keep it in mind.  Orders: -     Comprehensive metabolic panel with GFR -     CBC with Differential/Platelet -     Phosphorus -     Magnesium   Dental caries Assessment & Plan: Sending in referral to dentist.   Orders: -     Ambulatory referral to Dentistry  Ulcer of right foot, limited to breakdown of skin [facility]) Assessment & Plan: History of ulcer on the 5th metatarsal, plantar side, of the right foot, which has healed - Continue local wound care as needed. - Follow up with podiatry for ongoing foot and nail care.   Erectile dysfunction, unspecified erectile dysfunction type Assessment & Plan: Post-CVA erectile dysfunction. Patient requests oral medication. Has not tried any previous treatments. Due to ESRD on HD and episodes of symptomatic hypotension, oral PDE-5 inhibitors (e.g., Viagra , Cialis) are contraindicated due to his episodes of hypotension and ESRD.  Discussed alternative, lower-risk options. - Counselled on risks of oral ED medications with his comorbidities. - Refer to Urology to discuss non-systemic options including penile injections, shockwave therapy, and vacuum  pump devices.   Orders: -     Ambulatory referral to Urology     Return in about 5 months (around 12/26/2024) for DM.    Toribio MARLA Slain, MD

## 2024-07-26 NOTE — Assessment & Plan Note (Signed)
 Sending in referral to dentist.

## 2024-07-27 LAB — CBC WITH DIFFERENTIAL/PLATELET
Basophils Absolute: 0 x10E3/uL (ref 0.0–0.2)
Basos: 1 %
EOS (ABSOLUTE): 0.1 x10E3/uL (ref 0.0–0.4)
Eos: 1 %
Hematocrit: 42.7 % (ref 37.5–51.0)
Hemoglobin: 14 g/dL (ref 13.0–17.7)
Immature Grans (Abs): 0 x10E3/uL (ref 0.0–0.1)
Immature Granulocytes: 0 %
Lymphocytes Absolute: 1.6 x10E3/uL (ref 0.7–3.1)
Lymphs: 37 %
MCH: 30.9 pg (ref 26.6–33.0)
MCHC: 32.8 g/dL (ref 31.5–35.7)
MCV: 94 fL (ref 79–97)
Monocytes Absolute: 0.6 x10E3/uL (ref 0.1–0.9)
Monocytes: 15 %
Neutrophils Absolute: 1.9 x10E3/uL (ref 1.4–7.0)
Neutrophils: 46 %
Platelets: 188 x10E3/uL (ref 150–450)
RBC: 4.53 x10E6/uL (ref 4.14–5.80)
RDW: 12.7 % (ref 11.6–15.4)
WBC: 4.2 x10E3/uL (ref 3.4–10.8)

## 2024-07-27 LAB — COMPREHENSIVE METABOLIC PANEL WITH GFR
ALT: 20 IU/L (ref 0–44)
AST: 15 IU/L (ref 0–40)
Albumin: 4.9 g/dL (ref 3.9–4.9)
Alkaline Phosphatase: 148 IU/L — ABNORMAL HIGH (ref 44–121)
BUN/Creatinine Ratio: 3 — ABNORMAL LOW (ref 10–24)
BUN: 31 mg/dL — ABNORMAL HIGH (ref 8–27)
Bilirubin Total: 0.4 mg/dL (ref 0.0–1.2)
CO2: 26 mmol/L (ref 20–29)
Calcium: 8 mg/dL — ABNORMAL LOW (ref 8.6–10.2)
Chloride: 95 mmol/L — ABNORMAL LOW (ref 96–106)
Creatinine, Ser: 9.11 mg/dL — ABNORMAL HIGH (ref 0.76–1.27)
Globulin, Total: 3.6 g/dL (ref 1.5–4.5)
Glucose: 149 mg/dL — ABNORMAL HIGH (ref 70–99)
Potassium: 4.7 mmol/L (ref 3.5–5.2)
Sodium: 143 mmol/L (ref 134–144)
Total Protein: 8.5 g/dL (ref 6.0–8.5)
eGFR: 6 mL/min/1.73 — ABNORMAL LOW (ref >59)

## 2024-07-27 LAB — PHOSPHORUS: Phosphorus: 5.5 mg/dL — ABNORMAL HIGH (ref 2.8–4.1)

## 2024-07-27 LAB — MAGNESIUM: Magnesium: 2.2 mg/dL (ref 1.6–2.3)

## 2024-07-29 ENCOUNTER — Ambulatory Visit: Payer: Self-pay | Admitting: Family Medicine

## 2024-07-30 NOTE — Progress Notes (Signed)
 POST OPERATIVE OFFICE NOTE    CC:  F/u for surgery  HPI:  Brandon [name] is a 63 y.o. male who is here for postop check.  He recently underwent left upper arm AV fistula revision with sidebranch ligation x 2 on 06/12/2024 by Dr. Magda.  Prior to this revision, he underwent fistulogram with left cephalic arch balloon angioplasty on 06/03/2024 by Dr. Magda.  Both of these procedures were done for a poorly functioning left brachiocephalic AV fistula. He currently dialyzes at [facility] in [place] via left internal jugular TDC.   He returns today for follow up.  He says he is doing well at today's visit.  He denies any issues with his left arm incisions since surgery such as drainage, redness, or tenderness.  He continues to dialyze through a left IJ TDC without any issues.   Allergies  Allergen Reactions   Apresoline  [Hydralazine ] Other (See Comments)    Chest pain if by mouth- can tolerate it via IV   Imdur  [Isosorbide  Nitrate] Other (See Comments)    Headaches   Ms Contin  [Morphine ] Itching    Current Outpatient Medications  Medication Sig Dispense Refill   amLODipine  (NORVASC ) 10 MG tablet Take 1 tablet (10 mg total) by mouth daily. (Patient not taking: Reported on 07/26/2024) 180 tablet 1   Blood Glucose Monitoring Suppl DEVI Check glucose three times daily May substitute to any manufacturer covered by patient's insurance. 1 each 0   diphenhydrAMINE  (BENADRYL ) 25 MG tablet Take 25 mg by mouth every 6 (six) hours as needed for allergies.     Glucose Blood (BLOOD GLUCOSE TEST STRIPS) STRP Check glucose three times daily May substitute to any manufacturer covered by patient's insurance. 100 each 5   insulin  aspart (NOVOLOG  FLEXPEN) 100 UNIT/ML FlexPen Inject 0-25 Units into the skin 3 (three) times daily as needed (blood sugar > 200). 15 mL 2   Lancet Device MISC Check blood glucose three times daily May substitute to any manufacturer covered by patient's insurance. 1 each 0   Lancets  (ACCU-CHEK SOFT TOUCH) lancets Use as instructed (Patient taking differently: 1 each by Other route as directed.) 100 each 12   Lancets Misc. MISC Check glucose three times daily May substitute to any manufacturer covered by patient's insurance. 100 each 5   mupirocin  cream (BACTROBAN ) 2 % Apply 1 Application topically 2 (two) times daily. (Patient not taking: Reported on 07/26/2024) 15 g 0   oxyCODONE  (ROXICODONE ) 5 MG immediate release tablet Take 1 tablet (5 mg total) by mouth every 6 (six) hours as needed. 12 tablet 0   sertraline  (ZOLOFT ) 100 MG tablet Take 1 tablet (100 mg total) by mouth daily. 90 tablet 0   No current facility-administered medications for this visit.     ROS:  See HPI  Physical Exam:  Incision:  left upper arm incisions well healed without signs of infection or hematoma Extremities:  palpable left radial pulse. Left brachiocephalic fistula with good thrill throughout the upper arm, moderately pulsatile in the mid arm Neuro: intact motor and sensation of LUE   Studies Dialysis Duplex (08/01/2024) +--------------------+----------+-----------------+--------+  AVF                PSV (cm/s)Flow Vol (mL/min)Comments  +--------------------+----------+-----------------+--------+  Native artery inflow   277          1449                 +--------------------+----------+-----------------+--------+  AVF Anastomosis        460                               +--------------------+----------+-----------------+--------+     +------------+----------+-------------+----------+-------------------+  OUTFLOW VEINPSV (cm/s)Diameter (cm)Depth (cm)     Describe        +------------+----------+-------------+----------+-------------------+  Prox UA        398        0.76        0.62   partially-occlusive  +------------+----------+-------------+----------+-------------------+  Mid UA          96        0.90        0.36                         +------------+----------+-------------+----------+-------------------+  Dist UA         68        1.44        0.71                        +------------+----------+-------------+----------+-------------------+  AC Fossa       164        1.49        0.24                        +------------+----------+-------------+----------+-------------------+    Assessment/Plan:  This is a 63 y.o. male who is here for a post op check  -The patient recently underwent left cephalic arch balloon angioplasty followed by left arm fistula revision with sidebranch ligation x2 -His left arm incisions are well healed without dehiscence or signs of infection -Duplex demonstrates a patent LUE AVF with diameters >26mm and flow volume in the 1400s -On exam he has a palpable left radial pulse. His left upper arm fistula has a great thrill throughout the arm with moderate pulsatility in the mid arm. I suspect that some of this pulsatility should improve once his [facility] is removed, since it is in the left internal jugular -Dialysis can attempt using his fistula again. If this can be successfully used at least 3 times, the patient's catheter can be removed. Should be have any further issues with his fistula, he would likely require repeat fistulogram   Ahmed Holster, PA-C [facility] [phone]   Call MD: [place]

## 2024-08-01 ENCOUNTER — Ambulatory Visit: Attending: Vascular Surgery | Admitting: Physician Assistant

## 2024-08-01 ENCOUNTER — Ambulatory Visit (HOSPITAL_COMMUNITY)
Admission: RE | Admit: 2024-08-01 | Discharge: 2024-08-01 | Disposition: A | Discharge status: Home / Self Care | Source: Ambulatory Visit | Attending: Vascular Surgery | Admitting: Vascular Surgery

## 2024-08-01 VITALS — BP 138/80 | HR 73 | Temp 98.2°F | Wt 212.2 lb

## 2024-08-01 DIAGNOSIS — N186 End stage renal disease: Secondary | ICD-10-CM

## 2024-08-12 ENCOUNTER — Ambulatory Visit
Admission: EM | Admit: 2024-08-12 | Discharge: 2024-08-12 | Disposition: A | Discharge status: Home / Self Care | Attending: Physician Assistant | Admitting: Physician Assistant

## 2024-08-12 ENCOUNTER — Encounter: Payer: Self-pay | Admitting: Emergency Medicine

## 2024-08-12 ENCOUNTER — Ambulatory Visit (INDEPENDENT_AMBULATORY_CARE_PROVIDER_SITE_OTHER)

## 2024-08-12 DIAGNOSIS — S99921A Unspecified injury of right foot, initial encounter: Secondary | ICD-10-CM

## 2024-08-12 MED ORDER — DOXYCYCLINE HYCLATE 100 MG PO CAPS: 100.0000 mg | ORAL_CAPSULE | Freq: Two times a day (BID) | ORAL | 0 refills | Status: AC

## 2024-08-12 NOTE — ED Triage Notes (Signed)
 Pt reports getting his R foot slammed in metal door x7 days ago. Foot is still very sore and wound is not healing well to lateral side of R foot. Removed foam dressing. Skin is peeled back and wound bed is pink with yellowish discoloration in middle. Pt is a diabetic. Pt states the area was initially a large blister that popped on it's own 3 days ago. Small amounts of pink-clear drainage in sock since then as well. Pt is able to walk on foot, but it increases pain. No med use or creams applied to area.

## 2024-08-18 NOTE — ED Provider Notes (Signed)
 [facility]    CSN: [id number] Arrival date & time: 08/12/24  1153      History   Chief Complaint Chief Complaint  Patient presents with   Foot Injury    HPI Brandon [name] is a 63 y.o. male.   Patient here today for evaluation of injury to right foot that occurred a week ago.  He reports continued pain to the area.  He did have a wound develop after skin peeled off following injury.  He had some yellowish discoloration with same.  He is a diabetic.  He denies any fever.  He is able to walk but does have some increased pain with walking due to where the injury is.  He denies any treatment for the wound.  The history is provided by the patient.  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 ischemic stroke   GERD (gastroesophageal reflux disease)    Hiatal hernia    Hyperlipidemia    Hypertension    Seizures (HCC)    none since 2016, taking Keppra  - maw   Sleep apnea    does not use CPAP   Thyroid  disease    weak thyroid  per MD   Wears glasses     Patient Active Problem List   Diagnosis Date Noted   Dental caries 07/26/2024   Foot ulcer (HCC) 05/23/2024   Hyperkalemia 04/04/2024   ESRD (end stage renal  disease) on dialysis (HCC) 03/28/2024   AV fistula occlusion, initial encounter (HCC) 03/28/2024   Coagulation defect, unspecified (HCC) 02/29/2024   Shortness of breath 02/29/2024   Secondary hyperparathyroidism of renal origin (HCC) 02/28/2024   Other disorders of phosphorus metabolism 11/04/2023   Uremic pruritus 10/05/2023   Chronic bilateral low back pain without sciatica 09/06/2023   Chest pain 02/25/2023   End stage renal disease (HCC) 11/29/2022   Hemodialysis status (HCC) 11/29/2022   Acute respiratory failure with hypoxia (HCC) 10/02/2022   Hypertensive urgency 10/02/2022   Acute on chronic diastolic CHF (congestive heart failure) (HCC) 10/02/2022   Anemia due to chronic kidney disease 10/02/2022   Cognitive impairment 10/02/2022   Diabetic nephropathy associated with type 2 diabetes mellitus (HCC) 04/28/2022   Iron deficiency anemia 04/28/2022   Obesity (BMI 30-39.9) 04/17/2022   Protein-calorie malnutrition, severe 12/01/2021   Acute renal failure superimposed on stage 4 chronic kidney disease (HCC) 11/30/2021   AMS (altered mental status) 11/29/2021   Hypokalemia 11/29/2021   Callus 10/19/2021   Enlarged and hypertrophic nails 10/19/2021   Open wound of  toe 10/19/2021   Mental health disorder 10/19/2021   Delirium 09/21/2021   Benign prostatic hyperplasia 04/24/2021   Muscle weakness 04/24/2021   Polypharmacy 09/18/2020   Anemia 04/16/2020   Anxiety 04/16/2020   Controlled type 2 diabetes mellitus without complication, without long-term current use of insulin  (HCC) 02/09/2020   Type 2 diabetes mellitus with diabetic polyneuropathy, with long-term current use of insulin  (HCC) 02/09/2020   Family history of premature CAD 01/27/2020   Stage 3b chronic kidney disease (HCC) 01/09/2020   Incontinence of feces 01/09/2020   Chronic pain of left knee 01/09/2020   Fall 01/09/2020   History of diverticulitis 10/30/2019   Noncompliance 06/10/2019   History of renal cell  cancer 04/24/2018   Erectile dysfunction 04/24/2018   Edema 03/15/2018   Seizure disorder (HCC) 03/01/2018   History of stroke 11/29/2017   Obstructive sleep apnea 11/29/2017   Acute kidney injury (HCC) 05/23/2017   Hyperlipidemia associated with type 2 diabetes mellitus (HCC) 05/10/2017   Hypertension associated with diabetes (HCC) 05/10/2017   History of cerebrovascular accident (CVA) with residual deficit 05/10/2017   Diabetic ulcer of left foot associated with secondary diabetes mellitus (HCC) 04/21/2016   Depression, recurrent (HCC) 09/21/2014    Past Surgical History:  Procedure Laterality Date   A/V SHUNT INTERVENTION Left 06/03/2024   Procedure: A/V SHUNT INTERVENTION;  Surgeon: Magda Debby SAILOR, MD;  Location: [facility];  Service: Cardiovascular;  Laterality: Left;   AV FISTULA PLACEMENT Left 10/14/2022   Procedure: LEFT BRACHIOCAPHALIC FISTULA CREATION;  Surgeon: Gretta Lonni PARAS, MD;  Location[facility] OR;  Service: Vascular;  Laterality: Left;   BREAST BIOPSY Left 03/24/2023   US  LT BREAST BX W LOC DEV 1ST LESION IMG BX SPEC US  GUIDE 03/24/2023 [facility] MAMMOGRAPHY   COLONOSCOPY     DIALYSIS/PERMA CATHETER INSERTION N/A 04/08/2024   Procedure: DIALYSIS/PERMA CATHETER INSERTION;  Surgeon: Marea Selinda RAMAN, MD;  Location: [facility] INVASIVE [facility] LAB;  Service: Cardiovascular;  Laterality: N/A;   IR DIALY SHUNT INTRO NEEDLE/INTRACATH INITIAL W/IMG LEFT Left 03/29/2024   IR FLUORO GUIDE CV LINE RIGHT  10/06/2022   IR RADIOLOGIST EVAL & MGMT  04/04/2017   IR US  GUIDE VASC ACCESS RIGHT  10/06/2022   KIDNEY SURGERY     ablation of renal cell CA - 12/28, prior one was October 2012-Dr. Yamagata   MRI  11/2021   brain   REVISON OF ARTERIOVENOUS FISTULA Left 06/12/2024   Procedure: REVISON OF ARTERIOVENOUS FISTULA;  Surgeon: Magda Debby SAILOR, MD;  Location: [facility] OR;  Service: Vascular;  Laterality: Left;   TEE WITHOUT CARDIOVERSION N/A 11/19/2013   Procedure: TRANSESOPHAGEAL ECHOCARDIOGRAM (TEE);   Surgeon: Redell RAMAN Shallow, MD;  Location: [facility] ENDOSCOPY;  Service: Cardiovascular;  Laterality: N/A;   TEMPORARY DIALYSIS CATHETER N/A 04/04/2024   Procedure: TEMPORARY DIALYSIS CATHETER;  Surgeon: Marea Selinda RAMAN, MD;  Location: [facility] INVASIVE CV LAB;  Service: Cardiovascular;  Laterality: N/A;       Home Medications    Prior to Admission medications   Medication Sig Start Date End Date Taking? Authorizing Provider  Blood Glucose Monitoring Suppl DEVI Check glucose three times daily May substitute to any manufacturer covered by patient's insurance. 05/23/24  Yes Chandra Toribio POUR, MD  diphenhydrAMINE  (BENADRYL ) 25 MG tablet Take 25 mg by mouth every 6 (six) hours as needed for allergies.   Yes [provider]  doxycycline  (VIBRAMYCIN ) 100 MG capsule Take 1 capsule (100 mg total) by mouth 2 (two) times daily for 7 days. 08/12/24  08/19/24 Yes Billy Asberry FALCON, PA-C  Glucose Blood (BLOOD GLUCOSE TEST STRIPS) STRP Check glucose three times daily May substitute to any manufacturer covered by patient's insurance. 05/23/24  Yes Chandra Toribio POUR, MD  insulin  aspart (NOVOLOG  FLEXPEN) 100 UNIT/ML FlexPen Inject 0-25 Units into the skin 3 (three) times daily as needed (blood sugar > 200). 05/23/24  Yes Chandra Toribio POUR, MD  Lancet Device MISC Check blood glucose three times daily May substitute to any manufacturer covered by patient's insurance. 05/23/24  Yes Chandra Toribio POUR, MD  Lancets (ACCU-CHEK SOFT [facility]) lancets Use as instructed Patient taking differently: 1 each by Other route as directed. 06/11/19  Yes Tysinger, Alm RAMAN, PA-C  sertraline  (ZOLOFT ) 100 MG tablet Take 1 tablet (100 mg total) by mouth daily. 05/23/24  Yes Chandra Toribio POUR, MD  amLODipine  (NORVASC ) 10 MG tablet Take 1 tablet (10 mg total) by mouth daily. Patient not taking: Reported on 07/26/2024 04/08/24   Dezii, Alexandra, DO  Lancets Misc. MISC Check glucose three times daily May substitute to any manufacturer covered by patient's insurance.  05/23/24   Chandra Toribio POUR, MD  mupirocin  cream (BACTROBAN ) 2 % Apply 1 Application topically 2 (two) times daily. Patient not taking: Reported on 07/26/2024 05/23/24   Chandra Toribio POUR, MD  oxyCODONE  (ROXICODONE ) 5 MG immediate release tablet Take 1 tablet (5 mg total) by mouth every 6 (six) hours as needed. Patient not taking: Reported on 08/12/2024 06/12/24   Elna Ahmed SQUIBB, PA-C    Family History Family History  Problem Relation Age of Onset   Pneumonia Mother    Breast cancer Mother    Diabetes Father    Heart disease Father    Breast cancer Sister    Prostate cancer Other    Stomach cancer Other    Colon cancer Neg Hx    Esophageal cancer Neg Hx    Rectal cancer Neg Hx     Social History Social History   Tobacco Use   Smoking status: Never    Passive exposure: Never   Smokeless tobacco: Never  Vaping Use   Vaping status: Never Used  Substance Use Topics   Alcohol use: Not Currently    Comment: rarely   Drug use: No     Allergies   Apresoline  [hydralazine ], Imdur  [isosorbide  nitrate], and Ms contin  [morphine ]   Review of Systems Review of Systems  Constitutional:  Negative for chills and fever.  Eyes:  Negative for discharge and redness.  Respiratory:  Negative for shortness of breath.   Skin:  Positive for color change and wound.  Neurological:  Negative for numbness (no new or worsening numbness).     Physical Exam Triage Vital Signs ED Triage Vitals [08/12/24 1217]  Encounter Vitals Group     BP 126/77     Girls Systolic BP Percentile      Girls Diastolic BP Percentile      Boys Systolic BP Percentile      Boys Diastolic BP Percentile      Pulse Rate 74     Resp 14     Temp 98.6 F (37 C)     Temp Source Oral     SpO2 98 %     Weight      Height      Head Circumference      Peak Flow      Pain Score 8     Pain Loc      Pain Education  Exclude from Growth Chart    No data found.  Updated Vital Signs BP 126/77 (BP Location: Right Arm)    Pulse 74   Temp 98.6 F (37 C) (Oral)   Resp 14   SpO2 98%   Visual Acuity Right Eye Distance:   Left Eye Distance:   Bilateral Distance:    Right Eye Near:   Left Eye Near:    Bilateral Near:     Physical Exam Vitals and nursing note reviewed.  Constitutional:      General: He is not in acute distress.    Appearance: Normal appearance. He is not ill-appearing.  HENT:     Head: Normocephalic and atraumatic.  Eyes:     Conjunctiva/sclera: Conjunctivae normal.  Cardiovascular:     Rate and Rhythm: Normal rate.  Pulmonary:     Effort: Pulmonary effort is normal. No respiratory distress.  Skin:    Comments: See photo  Neurological:     Mental Status: He is alert.  Psychiatric:        Mood and Affect: Mood normal.        Behavior: Behavior normal.        Thought Content: Thought content normal.      UC Treatments / Results  Labs (all labs ordered are listed, but only abnormal results are displayed) Labs Reviewed - No data to display  EKG   Radiology No results found.  Procedures Procedures (including critical care time)  Medications Ordered in UC Medications - No data to display  Initial Impression / Assessment and Plan / UC Course  I have reviewed the triage vital signs and the nursing notes.  Pertinent labs & imaging results that were available during my care of the patient were reviewed by me and considered in my medical decision making (see chart for details).    X-ray ordered to rule out deeper infection, and x-ray without concerning findings.  Will treat with antibiotic and strongly recommended further evaluation in the emergency department if no gradual improvement or with any worsening.  Patient expressed understanding.  Final Clinical Impressions(s) / UC Diagnoses   Final diagnoses:  Right foot injury, initial encounter   Discharge Instructions   None    ED Prescriptions     Medication Sig Dispense Auth. Provider   doxycycline   (VIBRAMYCIN ) 100 MG capsule Take 1 capsule (100 mg total) by mouth 2 (two) times daily for 7 days. 14 capsule Billy Asberry FALCON, PA-C      PDMP not reviewed this encounter.   Billy Asberry FALCON, PA-C 08/18/24 1237

## 2024-08-22 ENCOUNTER — Ambulatory Visit (INDEPENDENT_AMBULATORY_CARE_PROVIDER_SITE_OTHER): Payer: Medicare Other

## 2024-08-22 DIAGNOSIS — Z0000 Encounter for general adult medical examination without abnormal findings: Secondary | ICD-10-CM

## 2024-08-22 NOTE — Progress Notes (Signed)
 Subjective:   Brandon [name] is a 63 y.o. who presents for a Medicare Wellness preventive visit.  As a reminder, Annual Wellness Visits don't include a physical exam, and some assessments may be limited, especially if this visit is performed virtually. We may recommend an in-person follow-up visit with your provider if needed.  Visit Complete: Virtual I connected with  Brandon [name] on 08/22/24 by a audio enabled telemedicine application and verified that I am speaking with the correct person using two identifiers.  Patient Location: Home  Provider Location: Home Office  I discussed the limitations of evaluation and management by telemedicine. The patient expressed understanding and agreed to proceed.  Vital Signs: Because this visit was a virtual/telehealth visit, some criteria may be missing or patient reported. Any vitals not documented were not able to be obtained and vitals that have been documented are patient reported.  VideoError- Librarian, academic were attempted between this provider and patient, however failed, due to patient having technical difficulties OR patient did not have access to video capability.  We continued and completed visit with audio only.   Persons Participating in Visit: Patient.  AWV Questionnaire: No: Patient Medicare AWV questionnaire was not completed prior to this visit.  Cardiac Risk Factors include: advanced age (>11men, >20 women);diabetes mellitus;dyslipidemia;hypertension;male gender     Objective:    Today's Vitals   08/22/24 1128  PainSc: 6    There is no height or weight on file to calculate BMI.     08/22/2024   11:44 AM 04/04/2024    8:16 AM 03/27/2024    7:41 PM 02/29/2024   12:39 PM 02/19/2024    7:39 PM 08/15/2023    2:50 PM 10/14/2022   11:00 AM  Advanced Directives  Does Patient Have a Medical Advance Directive? Yes No No;Yes No Yes Yes No  Type of Estate agent of  [place];Living will Healthcare Power of [employer] Power of [employer] Power of [employer] Power of [place];Living will   Does patient want to make changes to medical advance directive?      No - Patient declined   Copy of Healthcare Power of Attorney in Chart? Yes - validated most recent copy scanned in chart (See row information)     Yes - validated most recent copy scanned in chart (See row information)   Would patient like information on creating a medical advance directive?  No - Patient declined         Current Medications (verified) Outpatient Encounter Medications as of 08/22/2024  Medication Sig   Blood Glucose Monitoring Suppl DEVI Check glucose three times daily May substitute to any manufacturer covered by patient's insurance.   diphenhydrAMINE  (BENADRYL ) 25 MG tablet Take 25 mg by mouth every 6 (six) hours as needed for allergies.   Glucose Blood (BLOOD GLUCOSE TEST STRIPS) STRP Check glucose three times daily May substitute to any manufacturer covered by patient's insurance.   insulin  aspart (NOVOLOG  FLEXPEN) 100 UNIT/ML FlexPen Inject 0-25 Units into the skin 3 (three) times daily as needed (blood sugar > 200).   Lancet Device MISC Check blood glucose three times daily May substitute to any manufacturer covered by patient's insurance.   sertraline  (ZOLOFT ) 100 MG tablet Take 1 tablet (100 mg total) by mouth daily.   amLODipine  (NORVASC ) 10 MG tablet Take 1 tablet (10 mg total) by mouth daily. (Patient not taking: Reported on 08/22/2024)   Lancets (ACCU-CHEK SOFT TOUCH) lancets Use as  instructed (Patient not taking: Reported on 08/22/2024)   Lancets Misc. MISC Check glucose three times daily May substitute to any manufacturer covered by patient's insurance. (Patient not taking: Reported on 08/22/2024)   mupirocin  cream (BACTROBAN ) 2 % Apply 1 Application topically 2 (two) times daily. (Patient not taking: Reported on 08/22/2024)   oxyCODONE  (ROXICODONE ) 5 MG  immediate release tablet Take 1 tablet (5 mg total) by mouth every 6 (six) hours as needed. (Patient not taking: Reported on 08/22/2024)   No facility-administered encounter medications on file as of 08/22/2024.    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 ischemic stroke   GERD (gastroesophageal reflux disease)    Hiatal hernia    Hyperlipidemia    Hypertension    Seizures (HCC)    none since 2016, taking Keppra  - maw   Sleep apnea    does not use CPAP   Thyroid  disease    weak thyroid  per MD   Wears glasses    Past Surgical History:  Procedure Laterality Date   A/V SHUNT INTERVENTION Left 06/03/2024   Procedure: A/V SHUNT INTERVENTION;  Surgeon: Magda Debby SAILOR, MD;  Location: [facility];  Service: Cardiovascular;  Laterality: Left;   AV FISTULA PLACEMENT Left 10/14/2022   Procedure: LEFT BRACHIOCAPHALIC FISTULA CREATION;  Surgeon: Gretta Lonni PARAS, MD;  Location: [facility] OR;  Service: Vascular;  Laterality: Left;   BREAST BIOPSY Left 03/24/2023   US  LT BREAST BX W LOC DEV 1ST LESION IMG BX SPEC US  GUIDE 03/24/2023 [facility] MAMMOGRAPHY   COLONOSCOPY      DIALYSIS/PERMA CATHETER INSERTION N/A 04/08/2024   Procedure: DIALYSIS/PERMA CATHETER INSERTION;  Surgeon: Marea Selinda RAMAN, MD;  Location: [facility] INVASIVE [facility] LAB;  Service: Cardiovascular;  Laterality: N/A;   IR DIALY SHUNT INTRO NEEDLE/INTRACATH INITIAL W/IMG LEFT Left 03/29/2024   IR FLUORO GUIDE CV LINE RIGHT  10/06/2022   IR RADIOLOGIST EVAL & MGMT  04/04/2017   IR US  GUIDE VASC ACCESS RIGHT  10/06/2022   KIDNEY SURGERY     ablation of renal cell CA - 12/28, prior one was October 2012-Dr. Yamagata   MRI  11/2021   brain   REVISON OF ARTERIOVENOUS FISTULA Left 06/12/2024   Procedure: REVISON OF ARTERIOVENOUS FISTULA;  Surgeon: Magda Debby SAILOR, MD;  Location: [facility] OR;  Service: Vascular;  Laterality: Left;   TEE WITHOUT CARDIOVERSION N/A 11/19/2013   Procedure: TRANSESOPHAGEAL ECHOCARDIOGRAM (TEE);  Surgeon: Redell RAMAN Shallow, MD;  Location: [facility] ENDOSCOPY;  Service: Cardiovascular;  Laterality: N/A;  TEMPORARY DIALYSIS CATHETER N/A 04/04/2024   Procedure: TEMPORARY DIALYSIS CATHETER;  Surgeon: Marea Selinda RAMAN, MD;  Location: [facility] INVASIVE CV LAB;  Service: Cardiovascular;  Laterality: N/A;   Family History  Problem Relation Age of Onset   Pneumonia Mother    Breast cancer Mother    Diabetes Father    Heart disease Father    Breast cancer Sister    Prostate cancer Other    Stomach cancer Other    Colon cancer Neg Hx    Esophageal cancer Neg Hx    Rectal cancer Neg Hx    Social History   Socioeconomic History   Marital status: Single    Spouse name: Not on file   Number of children: 2   Years of education: college   Highest education level: Not on file  Occupational History   Occupation: disabled  Tobacco Use   Smoking status: Never    Passive exposure: Never   Smokeless tobacco: Never  Vaping Use   Vaping status: Never Used  Substance and Sexual Activity   Alcohol use: Not Currently    Comment: rarely   Drug use: No   Sexual activity: Not Currently  Other Topics Concern   Not  on file  Social History Narrative   Patient lives alone.   Education. Two years of college.   Not working.   Right handed.   Caffeine one soda daily. Drinks coffee    Social Drivers of Corporate investment banker Strain: Low Risk  (08/22/2024)   Overall Financial Resource Strain (CARDIA)    Difficulty of Paying Living Expenses: Not hard at all  Food Insecurity: No Food Insecurity (08/22/2024)   Hunger Vital Sign    Worried About Running Out of Food in the Last Year: Never true    Ran Out of Food in the Last Year: Never true  Transportation Needs: No Transportation Needs (08/22/2024)   PRAPARE - Administrator, Civil Service (Medical): No    Lack of Transportation (Non-Medical): No  Physical Activity: Insufficiently Active (08/22/2024)   Exercise Vital Sign    Days of Exercise per Week: 7 days    Minutes of Exercise per Session: 10 min  Stress: No Stress Concern Present (08/22/2024)   [employer] of Occupational Health - Occupational Stress Questionnaire    Feeling of Stress: Only a little  Social Connections: Moderately Isolated (08/22/2024)   Social Connection and Isolation Panel    Frequency of Communication with Friends and Family: More than three times a week    Frequency of Social Gatherings with Friends and Family: More than three times a week    Attends Religious Services: 1 to 4 times per year    Active Member of [employer] or Organizations: No    Attends Engineer, structural: Never    Marital Status: Never married    Tobacco Counseling Counseling given: Not Answered    Clinical Intake:  Pre-visit preparation completed: Yes  Pain : 0-10 Pain Score: 6  Pain Type: Chronic pain Pain Location: Foot Pain Orientation: Right Pain Descriptors / Indicators: Throbbing Pain Onset: More than a month ago Pain Frequency: Intermittent     Nutritional Risks: Nausea/ vomitting/ diarrhea (diarrhea this morning) Diabetes: Yes CBG done?: No Did pt.  bring in CBG monitor from home?: No  Lab Results  Component Value Date   HGBA1C 7.0 07/26/2024   HGBA1C 7.0 (H) 03/28/2024   HGBA1C 8.7 (H) 09/06/2023     How often do  you need to have someone help you when you read instructions, pamphlets, or other written materials from your doctor or pharmacy?: 1 - Never  Interpreter Needed?: No  Information entered by :: NAllen LPN   Activities of Daily Living     08/22/2024   11:33 AM 06/12/2024   10:03 AM  In your present state of health, do you have any difficulty performing the following activities:  Hearing? 1   Comment trouble on the left side   Vision? 1   Comment lost lots of sight in left eye   Difficulty concentrating or making decisions? 1   Comment since the stroke   Walking or climbing stairs? 1   Dressing or bathing? 1   Comment takes time   Doing errands, shopping? 1 1  Comment can't drive Get assistance  Preparing Food and eating ? N   Using the Toilet? N   In the past six months, have you accidently leaked urine? N   Do you have problems with loss of bowel control? N   Managing your Medications? N   Managing your Finances? N   Housekeeping or managing your Housekeeping? Y     Patient Care Team: Chandra Toribio POUR, MD as PCP - General (Family Medicine) Center, [facility]  I have updated your Care Teams any recent Medical Services you may have received from other providers in the past year.     Assessment:   This is a routine wellness examination for [place].  Hearing/Vision screen Hearing Screening - Comments:: Hearing loss in left ear Vision Screening - Comments:: Regular eye exams, [facility]   Goals Addressed             This Visit's Progress    Patient Stated       08/22/2024, wants to get on a kidney trial       Depression Screen     08/22/2024   11:47 AM 07/26/2024   10:38 AM 04/03/2024    2:04 PM 10/05/2023    4:15 PM 09/06/2023    9:56 AM 08/15/2023    2:45 PM 01/25/2023     2:26 PM  PHQ 2/9 Scores  PHQ - 2 Score 0 1 1 1 1  0 4  PHQ- 9 Score 0 7  7 13  22     Fall Risk     08/22/2024   11:45 AM 04/03/2024    2:05 PM 08/15/2023    2:48 PM 04/24/2018    1:13 PM 03/01/2018    9:08 AM  Fall Risk   Falls in the past year? 1 0 1 Yes  Yes   Comment fell down stairs      Number falls in past yr: 0 0 0 2 or more  2 or more   Injury with Fall? 1 0 0  Yes   Comment hurt foot    hit my left side   Risk for fall due to : Impaired mobility;Impaired balance/gait;Medication side effect;Impaired vision  No Fall Risks  Impaired balance/gait   Follow up Falls prevention discussed;Falls evaluation completed  Falls prevention discussed       Data saved with a previous flowsheet row definition    MEDICARE RISK AT HOME:  Medicare Risk at Home Any stairs in or around the home?: Yes If so, are there any without handrails?: No Home free of loose throw rugs in walkways, pet beds, electrical cords, etc?: Yes Adequate lighting in your home to reduce risk of falls?: Yes Life  alert?: No Use of a cane, walker or w/c?: Yes Grab bars in the bathroom?: Yes Shower chair or bench in shower?: No  TIMED UP AND GO:  Was the test performed?  No  Cognitive Function: 6CIT completed        08/22/2024   11:48 AM 08/15/2023    2:54 PM 08/15/2023    2:50 PM 08/01/2022    2:35 PM  6CIT Screen  What Year? 0 points 0 points 0 points 0 points  What month? 0 points 0 points 0 points 0 points  What time? 0 points 0 points 0 points 0 points  Count back from 20 0 points 0 points 0 points 0 points  Months in reverse 4 points 4 points 0 points 0 points  Repeat phrase 0 points 2 points 0 points 4 points  Total Score 4 points 6 points 0 points 4 points    Immunizations Immunization History  Administered Date(s) Administered   Influenza, Quadrivalent, Recombinant, Inj, Pf 11/01/2022   Influenza,inj,Quad PF,6+ Mos 11/18/2013, 07/29/2014, 10/19/2015, 08/15/2016, 02/05/2018, 12/24/2020,  10/19/2021, 10/06/2022   Influenza-Unspecified 09/04/2023   Moderna SARS-COV2 Booster Vaccination 07/07/2021   PFIZER(Purple Top)SARS-COV-2 Vaccination 03/14/2020, 04/21/2020, 12/24/2020, 09/04/2023   PNEUMOCOCCAL CONJUGATE-20 08/01/2022   Pneumococcal Polysaccharide-23 08/15/2016   Td 10/19/2021   Tdap 08/15/2016    Screening Tests Health Maintenance  Topic Date Due   Hepatitis C Screening  Never done   Zoster Vaccines- Shingrix (1 of 2) Never done   OPHTHALMOLOGY EXAM  04/27/2023   Influenza Vaccine  07/05/2024   COVID-19 Vaccine (5 - 2025-26 season) 08/05/2024   HEMOGLOBIN A1C  01/26/2025   FOOT EXAM  05/23/2025   Medicare Annual Wellness (AWV)  08/22/2025   Colonoscopy  06/16/2027   DTaP/Tdap/Td (3 - Td or Tdap) 10/20/2031   Pneumococcal Vaccine: 50+ Years  Completed   HIV Screening  Completed   Hepatitis B Vaccines 19-59 Average Risk  Aged Out   HPV VACCINES  Aged Out   Meningococcal B Vaccine  Aged Out    Health Maintenance Items Addressed: Due for flu and covid vaccines. Due for hep c screening  Additional Screening:  Vision Screening: Recommended annual ophthalmology exams for early detection of glaucoma and other disorders of the eye. Is the patient up to date with their annual eye exam?  Yes  Who is the provider or what is the name of the office in which the patient attends annual eye exams? [facility]  Dental Screening: Recommended annual dental exams for proper oral hygiene  Community Resource Referral / Chronic Care Management: CRR required this visit?  No   CCM required this visit?  No   Plan:    I have personally reviewed and noted the following in the patient's chart:   Medical and social history Use of alcohol, tobacco or illicit drugs  Current medications and supplements including opioid prescriptions. Patient is not currently taking opioid prescriptions. Functional ability and status Nutritional status Physical activity Advanced  directives List of other physicians Hospitalizations, surgeries, and ER visits in previous 12 months Vitals Screenings to include cognitive, depression, and falls Referrals and appointments  In addition, I have reviewed and discussed with patient certain preventive protocols, quality metrics, and best practice recommendations. A written personalized care plan for preventive services as well as general preventive health recommendations were provided to patient.   Ardella FORBES Dawn, LPN   [id number]   After Visit Summary: (MyChart) Due to this being a telephonic visit, the after visit summary  with patients personalized plan was offered to patient via MyChart   Notes: Nothing significant to report at this time.

## 2024-08-22 NOTE — Patient Instructions (Signed)
 [name],  Thank you for taking the time for your Medicare Wellness Visit. I appreciate your continued commitment to your health goals. Please review the care plan we discussed, and feel free to reach out if I can assist you further.  Medicare recommends these wellness visits once per year to help you and your care team stay ahead of potential health issues. These visits are designed to focus on prevention, allowing your provider to concentrate on managing your acute and chronic conditions during your regular appointments.  Please note that Annual Wellness Visits do not include a physical exam. Some assessments may be limited, especially if the visit was conducted virtually. If needed, we may recommend a separate in-person follow-up with your provider.  Ongoing Care Seeing your primary care provider every 3 to 6 months helps us  monitor your health and provide consistent, personalized care.   Referrals If a referral was made during today's visit and you haven't received any updates within two weeks, please contact the referred provider directly to check on the status.  Recommended Screenings:  Health Maintenance  Topic Date Due   Hepatitis C Screening  Never done   Zoster (Shingles) Vaccine (1 of 2) Never done   Eye exam for diabetics  04/27/2023   Flu Shot  07/05/2024   COVID-19 Vaccine (5 - 2025-26 season) 08/05/2024   Hemoglobin A1C  01/26/2025   Complete foot exam   05/23/2025   Medicare Annual Wellness Visit  08/22/2025   Colon Cancer Screening  06/16/2027   DTaP/Tdap/Td vaccine (3 - Td or Tdap) 10/20/2031   Pneumococcal Vaccine for age over 67  Completed   HIV Screening  Completed   Hepatitis B Vaccine  Aged Out   HPV Vaccine  Aged Out   Meningitis B Vaccine  Aged Out       08/22/2024   11:44 AM  Advanced Directives  Does Patient Have a Medical Advance Directive? Yes  Type of Estate agent of [place];Living will  Copy of Healthcare Power of  Attorney in Chart? Yes - validated most recent copy scanned in chart (See row information)   Advance Care Planning is important because it: Ensures you receive medical care that aligns with your values, goals, and preferences. Provides guidance to your family and loved ones, reducing the emotional burden of decision-making during critical moments.  Vision: Annual vision screenings are recommended for early detection of glaucoma, cataracts, and diabetic retinopathy. These exams can also reveal signs of chronic conditions such as diabetes and high blood pressure.  Dental: Annual dental screenings help detect early signs of oral cancer, gum disease, and other conditions linked to overall health, including heart disease and diabetes.  Please see the attached documents for additional preventive care recommendations.

## 2024-08-23 ENCOUNTER — Encounter (HOSPITAL_BASED_OUTPATIENT_CLINIC_OR_DEPARTMENT_OTHER): Payer: Self-pay | Admitting: Emergency Medicine

## 2024-08-23 ENCOUNTER — Emergency Department (HOSPITAL_BASED_OUTPATIENT_CLINIC_OR_DEPARTMENT_OTHER)
Admission: EM | Admit: 2024-08-23 | Discharge: 2024-08-24 | Disposition: A | Discharge status: Home / Self Care | Attending: Emergency Medicine | Admitting: Emergency Medicine

## 2024-08-23 ENCOUNTER — Other Ambulatory Visit: Payer: Self-pay

## 2024-08-23 DIAGNOSIS — F840 Autistic disorder: Secondary | ICD-10-CM | POA: Diagnosis not present

## 2024-08-23 DIAGNOSIS — E1122 Type 2 diabetes mellitus with diabetic chronic kidney disease: Secondary | ICD-10-CM | POA: Diagnosis not present

## 2024-08-23 DIAGNOSIS — Z794 Long term (current) use of insulin: Secondary | ICD-10-CM | POA: Insufficient documentation

## 2024-08-23 DIAGNOSIS — Z992 Dependence on renal dialysis: Secondary | ICD-10-CM | POA: Insufficient documentation

## 2024-08-23 DIAGNOSIS — Z79899 Other long term (current) drug therapy: Secondary | ICD-10-CM | POA: Diagnosis not present

## 2024-08-23 DIAGNOSIS — N186 End stage renal disease: Secondary | ICD-10-CM | POA: Diagnosis not present

## 2024-08-23 DIAGNOSIS — E875 Hyperkalemia: Secondary | ICD-10-CM | POA: Diagnosis not present

## 2024-08-23 DIAGNOSIS — Z8673 Personal history of transient ischemic attack (TIA), and cerebral infarction without residual deficits: Secondary | ICD-10-CM | POA: Insufficient documentation

## 2024-08-23 DIAGNOSIS — R1110 Vomiting, unspecified: Secondary | ICD-10-CM | POA: Diagnosis present

## 2024-08-23 DIAGNOSIS — Z85528 Personal history of other malignant neoplasm of kidney: Secondary | ICD-10-CM | POA: Diagnosis not present

## 2024-08-23 DIAGNOSIS — I120 Hypertensive chronic kidney disease with stage 5 chronic kidney disease or end stage renal disease: Secondary | ICD-10-CM | POA: Diagnosis not present

## 2024-08-23 DIAGNOSIS — Z7401 Bed confinement status: Secondary | ICD-10-CM | POA: Diagnosis not present

## 2024-08-23 LAB — BASIC METABOLIC PANEL WITH GFR
Anion gap: 23 — ABNORMAL HIGH (ref 5–15)
Anion gap: 26 — ABNORMAL HIGH (ref 5–15)
BUN: 96 mg/dL — ABNORMAL HIGH (ref 8–23)
BUN: 113 mg/dL — ABNORMAL HIGH (ref 8–23)
CO2: 20 mmol/L — ABNORMAL LOW (ref 22–32)
CO2: 21 mmol/L — ABNORMAL LOW (ref 22–32)
Calcium: 7.2 mg/dL — ABNORMAL LOW (ref 8.9–10.3)
Calcium: 7 mg/dL — ABNORMAL LOW (ref 8.9–10.3)
Chloride: 95 mmol/L — ABNORMAL LOW (ref 98–111)
Chloride: 94 mmol/L — ABNORMAL LOW (ref 98–111)
Creatinine, Ser: 18.7 mg/dL — ABNORMAL HIGH (ref 0.61–1.24)
Creatinine, Ser: 20.43 mg/dL — ABNORMAL HIGH (ref 0.61–1.24)
GFR, Estimated: 3 mL/min — ABNORMAL LOW (ref >60)
GFR, Estimated: 2 mL/min — ABNORMAL LOW (ref >60)
Glucose, Bld: 95 mg/dL (ref 70–99)
Glucose, Bld: 86 mg/dL (ref 70–99)
Potassium: 7 mmol/L (ref 3.5–5.1)
Potassium: 4.9 mmol/L (ref 3.5–5.1)
Sodium: 139 mmol/L (ref 135–145)
Sodium: 141 mmol/L (ref 135–145)

## 2024-08-23 LAB — HEPATITIS B SURFACE ANTIGEN: Hepatitis B Surface Ag: NONREACTIVE

## 2024-08-23 MED ORDER — HEPARIN SODIUM (PORCINE) 1000 UNIT/ML IJ SOLN
INTRAMUSCULAR | Status: AC
  Filled 2024-08-23: qty 5

## 2024-08-23 MED ORDER — DEXTROSE 50 % IV SOLN
1.0000 | Freq: Once | INTRAVENOUS | Status: AC
  Administered 2024-08-23: 50 mL via INTRAVENOUS
  Filled 2024-08-23: qty 50

## 2024-08-23 MED ORDER — SODIUM BICARBONATE 8.4 % IV SOLN
50.0000 meq | Freq: Once | INTRAVENOUS | Status: AC
  Administered 2024-08-23: 50 meq via INTRAVENOUS
  Filled 2024-08-23: qty 50

## 2024-08-23 MED ORDER — SODIUM ZIRCONIUM CYCLOSILICATE 10 G PO PACK
10.0000 g | PACK | Freq: Once | ORAL | Status: AC
  Administered 2024-08-23: 10 g via ORAL
  Filled 2024-08-23: qty 1

## 2024-08-23 MED ORDER — ALBUTEROL SULFATE (2.5 MG/3ML) 0.083% IN NEBU
10.0000 mg | INHALATION_SOLUTION | Freq: Once | RESPIRATORY_TRACT | Status: AC
  Administered 2024-08-23: 10 mg via RESPIRATORY_TRACT
  Filled 2024-08-23: qty 12

## 2024-08-23 MED ORDER — INSULIN ASPART 100 UNIT/ML IV SOLN
5.0000 [IU] | Freq: Once | INTRAVENOUS | Status: AC
  Administered 2024-08-23: 5 [IU] via INTRAVENOUS

## 2024-08-23 MED ORDER — CALCIUM GLUCONATE-NACL 1-0.675 GM/50ML-% IV SOLN
1.0000 g | Freq: Once | INTRAVENOUS | Status: AC
  Administered 2024-08-23: 1000 mg via INTRAVENOUS
  Filled 2024-08-23: qty 50

## 2024-08-23 MED ORDER — CHLORHEXIDINE GLUCONATE CLOTH 2 % EX PADS: 6.0000 | MEDICATED_PAD | Freq: Every day | CUTANEOUS | Status: DC

## 2024-08-23 NOTE — ED Provider Notes (Signed)
 [place] EMERGENCY DEPARTMENT AT [facility] Provider Note   CSN: [id number] Arrival date & time: 08/23/24  1303     Patient presents with: Vascular Access Problem   Br[place] [name] is a 63 y.o. male.   HPI   Patient has a history of hypertension diverticulitis stroke, seizures, diabetes, renal cell carcinoma, end-stage renal disease on dialysis, autism spectrum, bipolar disorder.  Patient states he did not go to dialysis yesterday.  He plans on going tomorrow.  However when he woke up this morning he noticed some crusting at the dialysis site.  He was concerned that he could be developing an infection so he came to the ED to be rechecked.  He is not have any fevers or chills.  No vomiting or diarrhea.  No difficulty breathing  Prior to Admission medications   Medication Sig Start Date End Date Taking? Authorizing Provider  amLODipine  (NORVASC ) 10 MG tablet Take 1 tablet (10 mg total) by mouth daily. Patient not taking: Reported on 08/22/2024 04/08/24   Dezii, Alexandra, DO  Blood Glucose Monitoring Suppl DEVI Check glucose three times daily May substitute to any manufacturer covered by patient's insurance. 05/23/24   Chandra Toribio POUR, MD  diphenhydrAMINE  (BENADRYL ) 25 MG tablet Take 25 mg by mouth every 6 (six) hours as needed for allergies.    [provider]  Glucose Blood (BLOOD GLUCOSE TEST STRIPS) STRP Check glucose three times daily May substitute to any manufacturer covered by patient's insurance. 05/23/24   Chandra Toribio POUR, MD  insulin  aspart (NOVOLOG  FLEXPEN) 100 UNIT/ML FlexPen Inject 0-25 Units into the skin 3 (three) times daily as needed (blood sugar > 200). 05/23/24   Chandra Toribio POUR, MD  Lancet Device MISC Check blood glucose three times daily May substitute to any manufacturer covered by patient's insurance. 05/23/24   Chandra Toribio POUR, MD  Lancets [facility]) lancets Use as instructed Patient not taking: Reported on 08/22/2024 06/11/19   Tysinger,  Alm RAMAN, PA-C  Lancets Misc. MISC Check glucose three times daily May substitute to any manufacturer covered by patient's insurance. Patient not taking: Reported on 08/22/2024 05/23/24   Chandra Toribio POUR, MD  mupirocin  cream (BACTROBAN ) 2 % Apply 1 Application topically 2 (two) times daily. Patient not taking: Reported on 08/22/2024 05/23/24   Chandra Toribio POUR, MD  oxyCODONE  (ROXICODONE ) 5 MG immediate release tablet Take 1 tablet (5 mg total) by mouth every 6 (six) hours as needed. Patient not taking: Reported on 08/22/2024 06/12/24   Elna Loud P, PA-C  sertraline  (ZOLOFT ) 100 MG tablet Take 1 tablet (100 mg total) by mouth daily. 05/23/24   Chandra Toribio POUR, MD    Allergies: Apresoline  [hydralazine ], Imdur  [isosorbide  nitrate], and Ms contin  [morphine ]    Review of Systems  Updated Vital Signs BP (!) 143/84 (BP Location: Right Arm)   Pulse 77   Temp (!) 97.5 F (36.4 C)   Resp 16   Ht 1.829 m (6')   Wt 97.1 kg   SpO2 100%   BMI 29.02 kg/m   Physical Exam Vitals and nursing note reviewed.  Constitutional:      General: He is not in acute distress.    Appearance: He is well-developed.  HENT:     Head: Normocephalic and atraumatic.     Right Ear: External ear normal.     Left Ear: External ear normal.  Eyes:     General: No scleral icterus.       Right eye: No  discharge.        Left eye: No discharge.     Conjunctiva/sclera: Conjunctivae normal.  Neck:     Trachea: No tracheal deviation.  Cardiovascular:     Rate and Rhythm: Normal rate and regular rhythm.     Comments: Vascular access catheter left chest Without any signs of erythema or induration, no drainage noted Pulmonary:     Effort: Pulmonary effort is normal. No respiratory distress.     Breath sounds: Normal breath sounds. No stridor.  Abdominal:     General: There is no distension.  Musculoskeletal:        General: No swelling or deformity.     Cervical back: Neck supple.  Skin:    General: Skin is warm and  dry.     Findings: No rash.  Neurological:     Mental Status: He is alert. Mental status is at baseline.     Cranial Nerves: No dysarthria or facial asymmetry.     Motor: No seizure activity.     (all labs ordered are listed, but only abnormal results are displayed) Labs Reviewed  BASIC METABOLIC PANEL WITH GFR - Abnormal; Notable for the following components:      Result Value   Potassium 7.0 (*)    Chloride 95 (*)    CO2 20 (*)    BUN 96 (*)    Creatinine, Ser 18.70 (*)    Calcium  7.2 (*)    GFR, Estimated 3 (*)    Anion gap 23 (*)    All other components within normal limits    EKG: None  Radiology: No results found.   .Critical Care  Performed by: Randol Simmonds, MD Authorized by: Randol Simmonds, MD   Critical care provider statement:    Critical care time (minutes):  30   New dressing placed.  Patient states catheter Medications Ordered in the ED  insulin  aspart (novoLOG ) injection 5 Units (has no administration in time range)    And  dextrose  50 % solution 50 mL (has no administration in time range)  sodium zirconium cyclosilicate  (LOKELMA ) packet 10 g (has no administration in time range)  albuterol  (PROVENTIL ) (2.5 MG/3ML) 0.083% nebulizer solution 10 mg (has no administration in time range)  calcium  gluconate 1 g/ 50 mL sodium chloride  IVPB (has no administration in time range)    Clinical Course as of 08/25/24 0925  Fri Aug 23, 2024  1454 Patient's metabolic panel shows potassium above 7. [JK]  1526 Case discussed with Dr. Adra.  Agrees with treatment that I have ordered for the patient's hyperkalemia also recommends a dose of bicarbonate.  Will plan on dialysis at [facility].  Request ED to ED transfer. [JK]  1527 Dr. Patsey accepts transfer at [facility] [JK]    Clinical Course User Index [JK] Randol Simmonds, MD                                 Medical Decision Making Problems Addressed: Hyperkalemia: acute illness or injury that poses a threat to  life or bodily functions Stage 5 chronic kidney disease on chronic dialysis [facility]): acute illness or injury that poses a threat to life or bodily functions  Amount and/or Complexity of Data Reviewed Labs: ordered. Decision-making details documented in ED Course.  Risk OTC drugs. Prescription drug management. Decision regarding hospitalization.   Pt presented with concerns of drainage from his dialysis catheter.  Exam reassuring.  NO signs of erythema, no drainage noted at chest wall or on dressing.    Pt missed dialysis.  No complaints of shortness of breath, weakness.  BMET however shows hyperkalemia.  Pt initially did not want any further treatment but explained danger of his hyperkalemia.  Agreed to treatment.  Hyperkalemia treated in the ED.  Case discussed with Dr Jerrye, nephrology.  Pt will be transferred to [facility] ED for urgent dialysis.     Final diagnoses:  Hyperkalemia  Stage 5 chronic kidney disease on chronic dialysis [facility])    ED Discharge Orders     None          Randol Simmonds, MD 08/25/24 [phone]

## 2024-08-23 NOTE — ED Provider Notes (Signed)
 [place] EMERGENCY DEPARTMENT AT [facility] Provider Note   CSN: [id number] Arrival date & time: 08/23/24  1303     History Chief Complaint  Patient presents with   Vascular Access Problem    HPI: [place] [name] is a 63 y.o. male with history pertinent for ESRD on IHD TTS, diverticulitis, HTN, stroke, seizures, T2DM, renal cell carcinoma, autism spectrum disorder, bipolar disorder who presents complaining of needing dialysis. Patient arrived via [facility] as a transfer from [facility].  History provided by patient and [facility].  No interpreter required during this encounter.  Patient reports that he missed dialysis yesterday because he was feeling unwell.  Noticed some crusting at his dialysis site today, therefore he went to outside hospital for evaluation.  EMS notes that patient was found to be hyperkalemic at OSH, received temporization and was transferred for dialysis.  Patient denies other acute complaints at this time.  Patient's recorded medical, surgical, social, medication list and allergies were reviewed in the Snapshot window as part of the initial history.   Prior to Admission medications   Medication Sig Start Date End Date Taking? Authorizing Provider  amLODipine  (NORVASC ) 10 MG tablet Take 1 tablet (10 mg total) by mouth daily. Patient not taking: Reported on 08/22/2024 04/08/24   Dezii, Alexandra, DO  Blood Glucose Monitoring Suppl DEVI Check glucose three times daily May substitute to any manufacturer covered by patient's insurance. 05/23/24   Chandra Toribio POUR, MD  diphenhydrAMINE  (BENADRYL ) 25 MG tablet Take 25 mg by mouth every 6 (six) hours as needed for allergies.    [provider]  Glucose Blood (BLOOD GLUCOSE TEST STRIPS) STRP Check glucose three times daily May substitute to any manufacturer covered by patient's insurance. 05/23/24   Chandra Toribio POUR, MD  insulin  aspart (NOVOLOG  FLEXPEN) 100 UNIT/ML FlexPen Inject 0-25 Units into  the skin 3 (three) times daily as needed (blood sugar > 200). 05/23/24   Chandra Toribio POUR, MD  Lancet Device MISC Check blood glucose three times daily May substitute to any manufacturer covered by patient's insurance. 05/23/24   Chandra Toribio POUR, MD  Lancets [facility]) lancets Use as instructed Patient not taking: Reported on 08/22/2024 06/11/19   Tysinger, Alm RAMAN, PA-C  Lancets Misc. MISC Check glucose three times daily May substitute to any manufacturer covered by patient's insurance. Patient not taking: Reported on 08/22/2024 05/23/24   Chandra Toribio POUR, MD  mupirocin  cream (BACTROBAN ) 2 % Apply 1 Application topically 2 (two) times daily. Patient not taking: Reported on 08/22/2024 05/23/24   Chandra Toribio POUR, MD  oxyCODONE  (ROXICODONE ) 5 MG immediate release tablet Take 1 tablet (5 mg total) by mouth every 6 (six) hours as needed. Patient not taking: Reported on 08/22/2024 06/12/24   Elna Loud P, PA-C  sertraline  (ZOLOFT ) 100 MG tablet Take 1 tablet (100 mg total) by mouth daily. 05/23/24   Chandra Toribio POUR, MD     Allergies: Apresoline  [hydralazine ], Imdur  [isosorbide  nitrate], and Ms contin  [morphine ]   Review of Systems   ROS as per HPI  Physical Exam Updated Vital Signs BP (!) 151/83   Pulse 72   Temp (!) 97.5 F (36.4 C)   Resp 15   Ht 6' (1.829 m)   Wt 97.1 kg   SpO2 100%   BMI 29.02 kg/m  Physical Exam Vitals and nursing note reviewed.  Constitutional:      General: He is not in acute distress.    Appearance: He is  well-developed.  HENT:     Head: Normocephalic and atraumatic.  Eyes:     Conjunctiva/sclera: Conjunctivae normal.  Cardiovascular:     Rate and Rhythm: Normal rate and regular rhythm.     Heart sounds: No murmur heard. Pulmonary:     Effort: Pulmonary effort is normal. No respiratory distress.     Breath sounds: Normal breath sounds.  Abdominal:     Palpations: Abdomen is soft.     Tenderness: There is no abdominal tenderness.  Musculoskeletal:         General: No swelling.     Cervical back: Neck supple.  Skin:    General: Skin is warm and dry.     Capillary Refill: Capillary refill takes less than 2 seconds.  Neurological:     Mental Status: He is alert.  Psychiatric:        Mood and Affect: Mood normal.     ED Course/ Medical Decision Making/ A&P Procedures Procedures   Medications Ordered in ED Medications  insulin  aspart (novoLOG ) injection 5 Units (5 Units Intravenous Given 08/23/24 1538)    And  dextrose  50 % solution 50 mL (50 mLs Intravenous Given 08/23/24 1541)  sodium zirconium cyclosilicate  (LOKELMA ) packet 10 g (10 g Oral Given 08/23/24 1527)  albuterol  (PROVENTIL ) (2.5 MG/3ML) 0.083% nebulizer solution 10 mg (10 mg Nebulization Given 08/23/24 1527)  calcium  gluconate 1 g/ 50 mL sodium chloride  IVPB (0 mg Intravenous Stopped 08/23/24 1545)  sodium bicarbonate  injection 50 mEq (50 mEq Intravenous Given 08/23/24 1543)    Medical Decision Making:   [place] [name] is a 63 y.o. male who presents for needing dialysis as per above.  Physical exam is pertinent for no focal abnormalities.   The differential includes but is not limited to hyperkalemia, uremia, azotemia, acidosis.  Independent historian: [facility]  External data reviewed: Labs and Notes: Reviewed notes from OSH, patient was temporized for hyperkalemia to 7.0 with prolongation of PR interval and QRS concerning for effects of potassium  Labs: Ordered, Independent interpretation, and Details: Repeat BMPwithout hyperkalemia.  Creatinine and BUN consistent with known ESRD  Radiology: Not indicated   EKG/Medicine tests: Ordered, Independent interpretation, and Details: Rate 89, sinus rhythm, similar prolongation of PR and QRS on comparison to prior from earlier today                Interventions: None  See the EMR for full details regarding lab and imaging results.  Patient arrives hemodynamically stable, no altered mental status, doubt uremia.   Patient with hyperkalemia temporized at OSH.  Repeat BMP without ongoing hyperkalemia.  Nephrology consulted, and will perform emergent dialysis.  Patient is amenable to this plan.  At the time of handoff, patient is awaiting return from dialysis.  Plan to reassess, likely discharge.  Presentation is most consistent with acute complicated illness and Current presentation is complicated by underlying chronic conditions  Discussion of management or test interpretations with external provider(s): Nephrology  Risk Drugs:None Treatment: Pending at the time of handoff, anticipate discharge  Disposition: In dialysis at the time of handoff  MDM generated using voice dictation software and may contain dictation errors.  Please contact me for any clarification or with any questions.  Clinical Impression:  1. Hyperkalemia   2. 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

## 2024-08-23 NOTE — ED Triage Notes (Signed)
 Pt reports missing dialysis x 1 (Thursday), woke up this AM with crusting over chest dialysis access site.   Reports emesis after leaving dialysis appt previously.

## 2024-08-23 NOTE — ED Notes (Signed)
 [facility] at bedside

## 2024-08-24 LAB — HEPATITIS B SURFACE ANTIBODY, QUANTITATIVE: Hep B S AB Quant (Post): 72.2 m[IU]/mL

## 2024-08-24 NOTE — Discharge Instructions (Signed)
Follow up with your dialysis center °

## 2024-08-24 NOTE — ED Provider Notes (Signed)
 I assumed care of the patient at 0010.  Patient required urgent dialysis.  He is back and feels better and would like to go home.  Discharge.   Emil Share, DO 08/24/24 0012

## 2024-10-11 ENCOUNTER — Encounter (HOSPITAL_COMMUNITY): Admission: RE | Disposition: A | Payer: Self-pay | Source: Home / Self Care | Attending: Surgery

## 2024-10-11 ENCOUNTER — Other Ambulatory Visit: Payer: Self-pay

## 2024-10-11 ENCOUNTER — Ambulatory Visit (HOSPITAL_COMMUNITY)
Admission: RE | Admit: 2024-10-11 | Discharge: 2024-10-11 | Disposition: A | Discharge status: Home / Self Care | Attending: Surgery | Admitting: Surgery

## 2024-10-11 DIAGNOSIS — E1122 Type 2 diabetes mellitus with diabetic chronic kidney disease: Secondary | ICD-10-CM | POA: Insufficient documentation

## 2024-10-11 DIAGNOSIS — T8242XA Displacement of vascular dialysis catheter, initial encounter: Secondary | ICD-10-CM

## 2024-10-11 DIAGNOSIS — Z4901 Encounter for fitting and adjustment of extracorporeal dialysis catheter: Secondary | ICD-10-CM | POA: Diagnosis present

## 2024-10-11 DIAGNOSIS — I132 Hypertensive heart and chronic kidney disease with heart failure and with stage 5 chronic kidney disease, or end stage renal disease: Secondary | ICD-10-CM | POA: Diagnosis not present

## 2024-10-11 DIAGNOSIS — I509 Heart failure, unspecified: Secondary | ICD-10-CM | POA: Diagnosis not present

## 2024-10-11 DIAGNOSIS — N186 End stage renal disease: Secondary | ICD-10-CM | POA: Diagnosis not present

## 2024-10-11 HISTORY — PX: TUNNELLED CATHETER EXCHANGE: CATH118373

## 2024-10-11 LAB — GLUCOSE, CAPILLARY: Glucose-Capillary: 128 mg/dL — ABNORMAL HIGH (ref 70–99)

## 2024-10-11 SURGERY — TUNNELLED CATHETER EXCHANGE
Anesthesia: LOCAL | Site: Chest

## 2024-10-11 MED ORDER — HEPARIN SODIUM (PORCINE) 1000 UNIT/ML IJ SOLN
INTRAMUSCULAR | Status: DC | PRN
  Administered 2024-10-11 (×2): 2100 [IU] via INTRAVENOUS

## 2024-10-11 MED ORDER — LIDOCAINE HCL (PF) 1 % IJ SOLN
INTRAMUSCULAR | Status: DC | PRN
Start: 2024-10-11 — End: 2024-10-11
  Administered 2024-10-11: 10 mL

## 2024-10-11 MED ORDER — LIDOCAINE HCL (PF) 1 % IJ SOLN
INTRAMUSCULAR | Status: AC
  Filled 2024-10-11: qty 30

## 2024-10-11 MED ORDER — HEPARIN SODIUM (PORCINE) 1000 UNIT/ML IJ SOLN
INTRAMUSCULAR | Status: AC
  Filled 2024-10-11: qty 10

## 2024-10-11 MED ORDER — HEPARIN (PORCINE) IN NACL 1000-0.9 UT/500ML-% IV SOLN
INTRAVENOUS | Status: DC | PRN
Start: 2024-10-11 — End: 2024-10-11
  Administered 2024-10-11: 500 mL

## 2024-10-11 SURGICAL SUPPLY — 4 items
CATH PALINDROME-P 28CM W/VT (CATHETERS) IMPLANT
GLIDEWIRE ADV .035X180CM (WIRE) IMPLANT
KIT PV (KITS) ×1 IMPLANT
TRAY PV CATH (CUSTOM PROCEDURE TRAY) ×1 IMPLANT

## 2024-10-11 NOTE — H&P (Signed)
 [facility] [place]  Patient name: [name] MRN: [id number] DOB: 08-25-1961 Sex: male   REASON FOR VISIT:    ESRD  HISOTRY OF PRESENT ILLNESS:    [place] [name] is a 63 y.o. male with end-stage renal disease who is currently dialyzing through a left sided catheter.  They are concerned that the catheter has nearly come out.  He also has a left upper arm fistula that was revised by Dr. Magda in July with sidebranch ligation.  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 ischemic stroke   GERD (gastroesophageal reflux disease)    Hiatal hernia    Hyperlipidemia    Hypertension    Seizures (HCC)    none since 2016, taking Keppra  - maw   Sleep apnea    does not use CPAP   Thyroid  disease    weak thyroid  per MD   Wears glasses      FAMILY HISTORY:   Family History  Problem Relation Age of Onset   Pneumonia Mother    Breast cancer Mother    Diabetes Father    Heart disease Father    Breast cancer Sister    Prostate cancer Other    Stomach cancer Other    Colon cancer Neg Hx    Esophageal cancer Neg Hx    Rectal cancer Neg Hx      SOCIAL HISTORY:   Social History   Tobacco Use   Smoking status: Never    Passive exposure: Never   Smokeless tobacco: Never  Substance Use Topics   Alcohol use: Not Currently    Comment: rarely     ALLERGIES:   Allergies  Allergen Reactions   Apresoline  [Hydralazine ] Other (See Comments)    Chest pain if by mouth- can tolerate it via IV   Imdur  [Isosorbide  Nitrate] Other (See Comments)    Headaches   Ms Contin  [Morphine ] Itching     CURRENT MEDICATIONS:   No current facility-administered medications for this encounter.    REVIEW OF SYSTEMS:   [X]  denotes positive finding, [ ]  denotes negative finding Cardiac  Comments:  Chest pain or chest pressure:    Shortness of breath upon exertion:    Short of breath when lying flat:    Irregular heart rhythm:        Vascular    Pain in calf, thigh, or hip brought on by ambulation:    Pain in feet at night that wakes you up from your sleep:  Blood clot in your veins:    Leg swelling:         Pulmonary    Oxygen at home:    Productive cough:     Wheezing:         Neurologic    Sudden weakness in arms or legs:     Sudden numbness in arms or legs:     Sudden onset of difficulty speaking or slurred speech:    Temporary loss of vision in one eye:     Problems with dizziness:         Gastrointestinal    Blood in stool:     Vomited blood:         Genitourinary    Burning when urinating:     Blood in urine:        Psychiatric    Major depression:         Hematologic    Bleeding problems:    Problems with blood clotting too easily:        Skin    Rashes or ulcers:        Constitutional    Fever or chills:      PHYSICAL EXAM:   Vitals:   10/11/24 1017 10/11/24 1045  BP: (!) 147/87 (!) 150/89  Pulse: 65 68  Resp: 12   Temp: 97.9 F (36.6 C)   SpO2: 100% 97%    GENERAL: The patient is a well-nourished male, in no acute distress. The vital signs are documented above. CARDIAC: There is a  regular rate and rhythm.  VASCULAR: Palpable thrill in left arm fistula PULMONARY: Non-labored respirations MUSCULOSKELETAL: There are no major deformities or cyanosis. NEUROLOGIC: No focal weakness or paresthesias are detected. SKIN: There are no ulcers or rashes noted. PSYCHIATRIC: The patient has a normal affect.  STUDIES:     MEDICAL ISSUES:   ESRD: The patient's catheter has nearly come out.  I have recommended replacing it.  He agrees to do this.  I also feel that his fistula can be utilized and would recommend using his fistula so that we can take his catheter out    Malvina New, IV, MD, FACS [facility] [phone] Pager [phone]

## 2024-10-11 NOTE — Op Note (Signed)
    Patient name: [place] [name] MRN: [id number] DOB: [birth year] Sex: male  10/11/2024 Pre-operative Diagnosis: ESRD Post-operative diagnosis:  Same Surgeon:  Malvina New Procedure:   Exchange of tunneled dialysis catheter under fluoroscopic guidance  Indications: The patient has had his catheter on the left nearly come out so exchange has been recommended  Procedure:  The patient was identified in the holding area and taken to [facility] 1  The patient was then placed supine on the table. local anesthesia was administered.  The patient was prepped and draped in the usual sterile fashion.  A time out was called.  1% lidocaine  was used for local anesthesia.  The cuff was nearly out of the skin as it was exposed but still somewhat incorporated.  Blunt dissection was used to fully expose the cuff and free it from the subcutaneous tissue.  Once this was done a Glidewire advantage was advanced into the inferior vena cava and the catheter was removed.  A new 28 cm tip to cuff catheter was inserted under fluoroscopic visualization.  The catheter tip was in the cavoatrial junction.  Both ports flushed and aspirated without difficulty.  The cath was filled the appropriate volume to heparin .  There were no immediate complications.  It was sutured in skin with 2-0 nylon     V. Malvina New, M.D., [facility] [place] Office: [phone] Pager:  [phone]

## 2024-10-14 ENCOUNTER — Encounter (HOSPITAL_COMMUNITY): Payer: Self-pay | Admitting: Surgery

## 2024-11-12 ENCOUNTER — Ambulatory Visit: Payer: Self-pay

## 2024-11-12 NOTE — Telephone Encounter (Signed)
 Can you call to try and schedule something with me this week instead of this urgent care appointment for tomorrow?

## 2024-11-12 NOTE — Telephone Encounter (Signed)
 FYI Only or Action Required?: FYI only for provider: scheduled pt appt with [place] urgent care on 11/13/2024.  Patient was last seen in primary care on 07/26/2024 by Chandra Toribio POUR, MD.  Called Nurse Triage reporting Foot Ulcer.  Symptoms began 1.5 weeks.  Interventions attempted: Nothing.  Symptoms are: gradually worsening.  Triage Disposition: See Physician Within 24 Hours  Patient/caregiver understands and will follow disposition?: Yes   Copied from CRM [id number]. Topic: Clinical - Red Word Triage >> Nov 12, 2024 10:55 AM Montie POUR wrote: Red Word that prompted transfer to Nurse Triage:  Ms. Ebb is Tracey's power of attorney. She is calling because is the ulcer on his right foot is getting worse. It has started bleeding again. He came to see Dr. Chandra on 07/26/24 for Ulcer of right foot, limited to breakdown of skin (HCC). She is calling to see what to do and also, to see if Dr. Chandra could call in medication for ulcer. Reason for Disposition  [1] Ulcer, wound, blister, sore, or red area AND [2] new or increasing  Answer Assessment - Initial Assessment Questions 1. SYMPTOM: What's the main symptom you're concerned about? (e.g., rash, sore, callus, drainage, numbness)     Bleeding from ulcer - possible pus 2. LOCATION: Where is the   located? (e.g., foot/toe, top/bottom, left/right)     Right foot 3. APPEARANCE: What does the area look like? (e.g., normal, red, swollen; size)     unsure 4. ONSET: When did the    start?     1.5 weeks 5. PAIN: Is there any pain? If Yes, ask: How bad is it? (Scale: 0-10; none, mild, moderate, severe)     moderate 6. CAUSE: What do you think is causing the symptoms?     ulcer 7. BLOOD GLUCOSE: What is your blood glucose level?      unsure 8. USUAL RANGE: What is your blood glucose level usually? (e.g., usual fasting morning value, usual evening value)     unsure 9. OTHER SYMPTOMS: Do you have any other symptoms? (e.g.,  fever, weakness)     no 10. PREGNANCY: Is there any chance you are pregnant? When was your last menstrual period?       Na  Scheduled pt to be seen at [place] tomorrow  Protocols used: Diabetes - Foot Problems and Questions-A-AH

## 2024-11-13 ENCOUNTER — Inpatient Hospital Stay: Admission: RE | Admit: 2024-11-13 | Discharge: 2024-11-13 | Payer: Self-pay | Attending: Family Medicine

## 2024-11-13 ENCOUNTER — Ambulatory Visit (INDEPENDENT_AMBULATORY_CARE_PROVIDER_SITE_OTHER)

## 2024-11-13 VITALS — BP 156/80 | HR 74 | Temp 97.8°F | Resp 18

## 2024-11-13 DIAGNOSIS — E118 Type 2 diabetes mellitus with unspecified complications: Secondary | ICD-10-CM

## 2024-11-13 DIAGNOSIS — L97519 Non-pressure chronic ulcer of other part of right foot with unspecified severity: Secondary | ICD-10-CM

## 2024-11-13 MED ORDER — OXYCODONE-ACETAMINOPHEN 5-325 MG PO TABS: 1.0000 | ORAL_TABLET | Freq: Four times a day (QID) | ORAL | 0 refills | Status: DC | PRN

## 2024-11-13 MED ORDER — DOXYCYCLINE HYCLATE 100 MG PO CAPS: 100.0000 mg | ORAL_CAPSULE | Freq: Two times a day (BID) | ORAL | 0 refills | Status: DC

## 2024-11-13 NOTE — Discharge Instructions (Addendum)
 Be aware, you have been prescribed pain medications that may cause drowsiness. While taking this medication, do not take any other medications containing acetaminophen (Tylenol). Do not combine with alcohol or recreational drugs. Please do not drive, operate heavy machinery, or take part in activities that require making important decisions while on this medication as your judgement may be clouded.

## 2024-11-13 NOTE — ED Provider Notes (Signed)
 [facility]   [id number] 11/13/24 Arrival Time: 1147  ASSESSMENT & PLAN:  1. Ulcer of right foot, unspecified ulcer stage (HCC)   2. Type 2 diabetes with complication (HCC)    I have personally viewed and independently interpreted the imaging studies ordered this visit. R foot: no evidence of osteomyelitis appreciated.  Meds ordered this encounter  Medications   oxyCODONE -acetaminophen  (PERCOCET/ROXICET) 5-325 MG tablet    Sig: Take 1 tablet by mouth every 6 (six) hours as needed for severe pain (pain score 7-10).    Dispense:  8 tablet    Refill:  0   doxycycline  (VIBRAMYCIN ) 100 MG capsule    Sig: Take 1 capsule (100 mg total) by mouth 2 (two) times daily.    Dispense:  20 capsule    Refill:  0   Orders Placed This Encounter  Procedures   Ambulatory referral to Orthopedic Surgery    Referral Priority:   Urgent    Referral Type:   Surgical    Referral Reason:   Specialty Services Required    Referred to Provider:   Harden Jerona GAILS, MD    Requested Specialty:   Orthopedic Surgery    Number of Visits Requested:   1   Wound/ortho referral placed.  Will follow up with PCP or here if worsening or failing to improve as anticipated. Reviewed expectations re: course of current medical issues. Questions answered. Outlined signs and symptoms indicating need for more acute intervention. Patient verbalized understanding. After Visit Summary given.   SUBJECTIVE:  Brandon [name] is a 63 y.o. male with PMH of DM, CHF, HTN, CKD who presents with a skin complaint. Reports ulcer to distal plantar RIGHT foot since approx 08/2024. At that time was placed on antibiotics for drainage. Over past week or so reports yellowish drainage and odor. Mild pain but enough to affect sleep. Denies fever. Could not see PCP today; sent here for evaluation.  OBJECTIVE: Vitals:   11/13/24 1225  BP: (!) 156/80  Pulse: 74  Resp: 18  Temp: 97.8 F (36.6 C)  TempSrc: Oral  SpO2: 97%     General appearance: alert; no distress Extremities: no edema; moves all extremities normally Skin: warm and dry  Psychological: alert and cooperative; normal mood and affect  Allergies  Allergen Reactions   Apresoline  [Hydralazine ] Other (See Comments)    Chest pain if by mouth- can tolerate it via IV   Imdur  [Isosorbide  Nitrate] Other (See Comments)    Headaches   Ms Contin  [Morphine ] Itching    Past Medical History:  Diagnosis Date   Acute ischemic stroke (HCC) 11/2013   Allergy    Anemia    hx   Anxiety    Arthritis    Autism spectrum    high functioning per SO ib 06/11/24   Benign hypertension with CKD (chronic kidney disease) stage III (HCC)    Dialysis T T S   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  ischemic stroke   GERD (gastroesophageal reflux disease)    Hiatal hernia    Hyperlipidemia    Hypertension    Seizures (HCC)    none since 2016, taking Keppra  - maw   Sleep apnea    does not use CPAP   Thyroid  disease    weak thyroid  per MD   Wears glasses    Social History   Socioeconomic History   Marital status: Single    Spouse name: Not on file   Number of children: 2   Years of education: college   Highest education level: Not on file  Occupational History   Occupation: disabled  Tobacco Use   Smoking status: Never    Passive exposure: Never   Smokeless tobacco: Never  Vaping Use   Vaping status: Never Used  Substance and Sexual Activity   Alcohol use: Not Currently    Comment: rarely   Drug use: No   Sexual  activity: Not Currently  Other Topics Concern   Not on file  Social History Narrative   Patient lives alone.   Education. Two years of college.   Not working.   Right handed.   Caffeine one soda daily. Drinks coffee    Social Drivers of Corporate Investment Banker Strain: Low Risk  (08/22/2024)   Overall Financial Resource Strain (CARDIA)    Difficulty of Paying Living Expenses: Not hard at all  Food Insecurity: No Food Insecurity (08/22/2024)   Hunger Vital Sign    Worried About Running Out of Food in the Last Year: Never true    Ran Out of Food in the Last Year: Never true  Transportation Needs: No Transportation Needs (08/22/2024)   PRAPARE - Administrator, Civil Service (Medical): No    Lack of Transportation (Non-Medical): No  Physical Activity: Insufficiently Active (08/22/2024)   Exercise Vital Sign    Days of Exercise per Week: 7 days    Minutes of Exercise per Session: 10 min  Stress: No Stress Concern Present (08/22/2024)   [employer] of Occupational Health - Occupational Stress Questionnaire    Feeling of Stress: Only a little  Social Connections: Moderately Isolated (08/22/2024)   Social Connection and Isolation Panel    Frequency of Communication with Friends and Family: More than three times a week    Frequency of Social Gatherings with Friends and Family: More than three times a week    Attends Religious Services: 1 to 4 times per year    Active Member of [employer] or Organizations: No    Attends Banker Meetings: Never    Marital Status: Never married  Intimate Partner Violence: Not At Risk (08/22/2024)   Humiliation, Afraid, Rape, and Kick questionnaire    Fear of Current or Ex-Partner: No    Emotionally Abused: No    Physically Abused: No    Sexually Abused: No   Family History  Problem Relation Age of Onset   Pneumonia Mother    Breast cancer Mother    Diabetes Father    Heart disease Father    Breast cancer Sister     Prostate cancer Other    Stomach cancer Other    Colon cancer Neg Hx    Esophageal cancer Neg Hx    Rectal cancer Neg Hx    Past Surgical History:  Procedure Laterality Date   A/V SHUNT INTERVENTION Left 06/03/2024   Procedure: A/V SHUNT INTERVENTION;  Surgeon: Magda Debby SAILOR, MD;  Location: [facility]  LAB;  Service: Cardiovascular;  Laterality: Left;   AV FISTULA PLACEMENT Left 10/14/2022   Procedure: LEFT BRACHIOCAPHALIC FISTULA CREATION;  Surgeon: Gretta Lonni PARAS, MD;  Location: [facility] OR;  Service: Vascular;  Laterality: Left;   BREAST BIOPSY Left 03/24/2023   US  LT BREAST BX W LOC DEV 1ST LESION IMG BX SPEC US  GUIDE 03/24/2023 [facility] MAMMOGRAPHY   COLONOSCOPY     DIALYSIS/PERMA CATHETER INSERTION N/A 04/08/2024   Procedure: DIALYSIS/PERMA CATHETER INSERTION;  Surgeon: Marea Selinda RAMAN, MD;  Location: [facility] INVASIVE CV LAB;  Service: Cardiovascular;  Laterality: N/A;   IR DIALY SHUNT INTRO NEEDLE/INTRACATH INITIAL W/IMG LEFT Left 03/29/2024   IR FLUORO GUIDE CV LINE RIGHT  10/06/2022   IR RADIOLOGIST EVAL & MGMT  04/04/2017   IR US  GUIDE VASC ACCESS RIGHT  10/06/2022   KIDNEY SURGERY     ablation of renal cell CA - 12/28, prior one was October 2012-Dr. Yamagata   MRI  11/2021   brain   REVISON OF ARTERIOVENOUS FISTULA Left 06/12/2024   Procedure: REVISON OF ARTERIOVENOUS FISTULA;  Surgeon: Magda Debby SAILOR, MD;  Location: [facility] OR;  Service: Vascular;  Laterality: Left;   TEE WITHOUT CARDIOVERSION N/A 11/19/2013   Procedure: TRANSESOPHAGEAL ECHOCARDIOGRAM (TEE);  Surgeon: Redell RAMAN Shallow, MD;  Location: [facility] ENDOSCOPY;  Service: Cardiovascular;  Laterality: N/A;   TEMPORARY DIALYSIS CATHETER N/A 04/04/2024   Procedure: TEMPORARY DIALYSIS CATHETER;  Surgeon: Marea Selinda RAMAN, MD;  Location: [facility] INVASIVE CV LAB;  Service: Cardiovascular;  Laterality: N/A;   TUNNELLED CATHETER EXCHANGE N/A 10/11/2024   Procedure: TUNNELLED CATHETER EXCHANGE;  Surgeon: Serene Gaile ORN, MD;  Location: [facility] LAB;   Service: Cardiovascular;  Laterality: N/A;      Rolinda Redell, MD 11/13/24 1429

## 2024-11-13 NOTE — ED Triage Notes (Signed)
 Pt reports he was seen her for right foot injury in September. States it is draining and has a bad smell for the last 10 days. Pt ambulated to room without assist. States he is out of antibiotics. Last dialysis was Saturday, he missed session yesterday. Reports he is waiting to be transferred to a closer location to his home.

## 2024-11-14 ENCOUNTER — Ambulatory Visit (HOSPITAL_COMMUNITY): Payer: Self-pay

## 2024-11-27 ENCOUNTER — Other Ambulatory Visit: Payer: Self-pay | Admitting: Family Medicine

## 2024-11-27 DIAGNOSIS — F339 Major depressive disorder, recurrent, unspecified: Secondary | ICD-10-CM

## 2024-12-03 ENCOUNTER — Ambulatory Visit (INDEPENDENT_AMBULATORY_CARE_PROVIDER_SITE_OTHER): Admitting: Orthopedic Surgery

## 2024-12-03 ENCOUNTER — Other Ambulatory Visit (INDEPENDENT_AMBULATORY_CARE_PROVIDER_SITE_OTHER)

## 2024-12-03 DIAGNOSIS — I739 Peripheral vascular disease, unspecified: Secondary | ICD-10-CM | POA: Diagnosis not present

## 2024-12-03 DIAGNOSIS — M868X7 Other osteomyelitis, ankle and foot: Secondary | ICD-10-CM

## 2024-12-03 NOTE — Progress Notes (Unsigned)
 "  Office Visit Note   Patient: Brandon [name]           Date of Birth: 04-Aug-1961           MRN: [id number] Visit Date: 12/03/2024              Requested by: Rolinda Rogue, MD [address] [place],  [place] [address] PCP: Chandra Toribio POUR, MD  Chief Complaint  Patient presents with   Right Foot - Wound Check    Lateral foot ulcer      HPI: Discussed the use of AI scribe software for clinical note transcription with the patient, who gave verbal consent to proceed.  History of Present Illness Brandon [name] is a 63 year old male with chronic right foot ulcer, osteomyelitis, and peripheral vascular disease who presents for evaluation of persistent right foot pain and non-healing ulcer.  He has constant, severe, throbbing pain involving the plantar and lateral aspects of the right foot. The pain persists most of the time, including when the foot is elevated. He is unable to determine if the pain worsens with elevation or dependency, but generally keeps the foot elevated and remains still to minimize discomfort.  There is a chronic ulcer beneath the right fifth metatarsal head that has failed to heal despite ongoing care. Previous radiographs, including one two weeks ago, did not show severe findings initially, but the ulcer has persisted, prompting further evaluation.  Recent radiographs from December 10th demonstrated osteophytic bone spurs, arterial calcification at the ankle, destructive changes of the great toe metatarsophalangeal joint, and lytic changes of the fifth metatarsal head. He has not undergone vascular studies such as ankle-brachial indices. He underwent exchange of a dialysis catheter in November. His most recent hemoglobin A1c was 7.0.     Assessment & Plan: Visit Diagnoses:  1. 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 Intervention depends on improved circulation. - Obtained repeat radiographs of the right foot. - Planned urgent vascular surgery consultation prior to further intervention.  Peripheral vascular disease of right lower extremity Severe peripheral vascular disease with arterial calcification, diminished pulses, and ischemic pain. Circulatory compromise affects ulcer healing and osteomyelitis persistence. Perfusion improvement is necessary before further management. - Ordered ankle-brachial index studies to assess perfusion. - Ordered urgent vascular surgery consultation for possible revascularization.  Chronic ulcer of right foot Chronic non-healing ulcer beneath right fifth metatarsal head, associated with osteomyelitis. Poor healing due to peripheral vascular disease and impaired perfusion. Ulcer unlikely to heal without improved circulation. - Obtained repeat radiographs for bony involvement assessment. - Planned vascular evaluation and intervention to improve perfusion and facilitate healing.      Follow-Up Instructions: No follow-ups on file.   Ortho Exam  Patient is alert, oriented, no adenopathy, well-dressed, normal affect, normal respiratory effort. Physical Exam CARDIOVASCULAR: No palpable dorsalis pedis or posterior tibial pulse. Doppler: strong biphasic posterior tibial pulse, dampened monophasic dorsalis pedis pulse. EXTREMITIES: Chronic ulcer beneath the fifth metatarsal head.      Imaging: No results found. No images are attached to the encounter.  Labs: Lab Results  Component Value Date   HGBA1C 7.0 07/26/2024   HGBA1C 7.0 (H) 03/28/2024   HGBA1C 8.7 (H) 09/06/2023   ESRSEDRATE 45 (H) 07/08/2021   ESRSEDRATE 28 (H) 02/02/2018  ESRSEDRATE 18 12/29/2016   CRP 1.0 (H) 07/08/2021   CRP 6.7 (H) 04/22/2016   REPTSTATUS 04/06/2020 FINAL 04/05/2020   CULT MULTIPLE SPECIES  PRESENT, SUGGEST RECOLLECTION (A) 04/05/2020   LABORGA NO GROWTH 10/21/2016     Lab Results  Component Value Date   ALBUMIN 4.9 07/26/2024   ALBUMIN 3.7 04/08/2024   ALBUMIN 3.7 04/07/2024    Lab Results  Component Value Date   MG 2.2 07/26/2024   MG 2.0 04/08/2024   MG 2.1 04/07/2024   No results found for: VD25OH  No results found for: PREALBUMIN    Latest Ref Rng & Units 07/26/2024   10:37 AM 06/12/2024    9:51 AM 04/08/2024    7:24 AM  CBC EXTENDED  WBC 3.4 - 10.8 x10E3/uL 4.2   4.0   RBC 4.14 - 5.80 x10E6/uL 4.53   3.23   Hemoglobin 13.0 - 17.7 g/dL 85.9  86.6  9.9   HCT 62.4 - 51.0 % 42.7  39.0  29.7   Platelets 150 - 450 x10E3/uL 188   169   NEUT# 1.4 - 7.0 x10E3/uL 1.9   2.2   Lymph# 0.7 - 3.1 x10E3/uL 1.6   1.3      There is no height or weight on file to calculate BMI.  Orders:  Orders Placed This Encounter  Procedures   XR Foot Complete Right   No orders of the defined types were placed in this encounter.    Procedures: No procedures performed  Clinical Data: No additional findings.  ROS:  All other systems negative, except as noted in the HPI. Review of Systems  Objective: Vital Signs: There were no vitals taken for this visit.  Specialty Comments:  No specialty comments available.  PMFS History: Patient Active Problem List   Diagnosis Date Noted   Dental caries 07/26/2024   Foot ulcer (HCC) 05/23/2024   Hyperkalemia 04/04/2024   ESRD (end stage renal disease) on dialysis (HCC) 03/28/2024   AV fistula occlusion, initial encounter 03/28/2024   Coagulation defect, unspecified 02/29/2024   Shortness of breath 02/29/2024   Secondary hyperparathyroidism of renal origin 02/28/2024   Other disorders of phosphorus metabolism 11/04/2023   Uremic pruritus 10/05/2023   Chronic bilateral low back pain without sciatica 09/06/2023   Chest pain 02/25/2023   End stage renal disease (HCC) 11/29/2022   Hemodialysis status  11/29/2022   Acute respiratory failure with hypoxia (HCC) 10/02/2022   Hypertensive urgency 10/02/2022   Acute on chronic diastolic CHF (congestive heart failure) (HCC) 10/02/2022   Anemia due to chronic kidney disease 10/02/2022   Cognitive impairment 10/02/2022   Diabetic nephropathy associated with type 2 diabetes mellitus (HCC) 04/28/2022   Iron deficiency anemia 04/28/2022   Obesity (BMI 30-39.9) 04/17/2022   Protein-calorie malnutrition, severe 12/01/2021   Acute renal failure superimposed on stage 4 chronic kidney disease (HCC) 11/30/2021   AMS (altered mental status) 11/29/2021   Hypokalemia 11/29/2021   Callus 10/19/2021   Enlarged and hypertrophic nails 10/19/2021   Open wound of toe 10/19/2021   Mental health disorder 10/19/2021   Delirium 09/21/2021   Benign prostatic hyperplasia 04/24/2021   Muscle weakness 04/24/2021   Hyperlipidemia 04/24/2021   Unsteady 04/24/2021   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) 04/24/2021   Long term (current) use of insulin  (HCC) 04/24/2021   Hyperosmolarity syndrome 04/24/2021   Low back pain 04/24/2021   Epilepsy (HCC) 04/24/2021   Chronic kidney disease due to type 2 diabetes mellitus (HCC)  04/24/2021   Type 2 diabetes mellitus (HCC) 04/24/2021   Polypharmacy 09/18/2020   Anemia 04/16/2020   Anxiety 04/16/2020   Controlled type 2 diabetes mellitus without complication, without long-term current use of insulin  (HCC) 02/09/2020   Type 2 diabetes mellitus with diabetic polyneuropathy, with long-term current use of insulin  (HCC) 02/09/2020   Family history of premature CAD 01/27/2020   Stage 3b chronic kidney disease (HCC) 01/09/2020   Incontinence of feces 01/09/2020   Chronic pain of left knee 01/09/2020   Fall 01/09/2020   History of diverticulitis 10/30/2019   Noncompliance 06/10/2019   History of renal cell cancer 04/24/2018   Erectile dysfunction  04/24/2018   Edema 03/15/2018   Seizure disorder (HCC) 03/01/2018   History of stroke 11/29/2017   Obstructive sleep apnea 11/29/2017   Acute kidney injury 05/23/2017   Hyperlipidemia associated with type 2 diabetes mellitus (HCC) 05/10/2017   Hypertension associated with diabetes (HCC) 05/10/2017   History of cerebrovascular accident (CVA) with residual deficit 05/10/2017   Diabetic ulcer of left foot associated with secondary diabetes mellitus (HCC) 04/21/2016   Depression, recurrent 09/21/2014   Past Medical History:  Diagnosis Date   Acute ischemic stroke (HCC) 11/2013   Allergy    Anemia    hx   Anxiety    Arthritis    Autism spectrum    high functioning per SO ib 06/11/24   Benign hypertension with CKD (chronic kidney disease) stage III (HCC)    Dialysis T T S   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 ischemic stroke   GERD (gastroesophageal reflux disease)    Hiatal hernia    Hyperlipidemia    Hypertension    Seizures (HCC)    none since 2016, taking Keppra  - maw   Sleep apnea    does not use CPAP   Thyroid  disease    weak thyroid  per MD   Wears glasses     Family History  Problem Relation Age of Onset   Pneumonia Mother    Breast cancer Mother    Diabetes Father    Heart disease Father    Breast cancer Sister    Prostate cancer Other    Stomach cancer Other    Colon cancer Neg Hx    Esophageal cancer Neg Hx    Rectal cancer Neg Hx     Past Surgical History:  Procedure Laterality  Date   A/V SHUNT INTERVENTION Left 06/03/2024   Procedure: A/V SHUNT INTERVENTION;  Surgeon: Magda Debby SAILOR, MD;  Location: [facility];  Service: Cardiovascular;  Laterality: Left;   AV FISTULA PLACEMENT Left 10/14/2022   Procedure: LEFT BRACHIOCAPHALIC FISTULA CREATION;  Surgeon: Gretta Lonni PARAS, MD;  Location: [facility] OR;  Service: Vascular;  Laterality: Left;   BREAST BIOPSY Left 03/24/2023   US  LT BREAST BX W LOC DEV 1ST LESION IMG BX SPEC US  GUIDE 03/24/2023 [facility] MAMMOGRAPHY   COLONOSCOPY     DIALYSIS/PERMA CATHETER INSERTION N/A 04/08/2024  Procedure: DIALYSIS/PERMA CATHETER INSERTION;  Surgeon: Marea Selinda RAMAN, MD;  Location: [facility] INVASIVE CV LAB;  Service: Cardiovascular;  Laterality: N/A;   IR DIALY SHUNT INTRO NEEDLE/INTRACATH INITIAL W/IMG LEFT Left 03/29/2024   IR FLUORO GUIDE CV LINE RIGHT  10/06/2022   IR RADIOLOGIST EVAL & MGMT  04/04/2017   IR US  GUIDE VASC ACCESS RIGHT  10/06/2022   KIDNEY SURGERY     ablation of renal cell CA - 12/28, prior one was October 2012-Dr. Yamagata   MRI  11/2021   brain   REVISON OF ARTERIOVENOUS FISTULA Left 06/12/2024   Procedure: REVISON OF ARTERIOVENOUS FISTULA;  Surgeon: Magda Debby SAILOR, MD;  Location: [facility] OR;  Service: Vascular;  Laterality: Left;   TEE WITHOUT CARDIOVERSION N/A 11/19/2013   Procedure: TRANSESOPHAGEAL ECHOCARDIOGRAM (TEE);  Surgeon: Redell RAMAN Shallow, MD;  Location: [facility] ENDOSCOPY;  Service: Cardiovascular;  Laterality: N/A;   TEMPORARY DIALYSIS CATHETER N/A 04/04/2024   Procedure: TEMPORARY DIALYSIS CATHETER;  Surgeon: Marea Selinda RAMAN, MD;  Location: [facility] INVASIVE CV LAB;  Service: Cardiovascular;  Laterality: N/A;   TUNNELLED CATHETER EXCHANGE N/A 10/11/2024   Procedure: TUNNELLED CATHETER EXCHANGE;  Surgeon: Serene Gaile ORN, MD;  Location: [facility];  Service: Cardiovascular;  Laterality: N/A;   Social History   Occupational History   Occupation: disabled  Tobacco Use   Smoking status: Never    Passive  exposure: Never   Smokeless tobacco: Never  Vaping Use   Vaping status: Never Used  Substance and Sexual Activity   Alcohol use: Not Currently    Comment: rarely   Drug use: No   Sexual activity: Not Currently         "

## 2024-12-04 ENCOUNTER — Encounter: Payer: Self-pay | Admitting: Orthopedic Surgery

## 2024-12-13 ENCOUNTER — Ambulatory Visit (HOSPITAL_COMMUNITY)
Admission: RE | Admit: 2024-12-13 | Discharge: 2024-12-13 | Disposition: A | Source: Ambulatory Visit | Attending: Orthopedic Surgery | Admitting: Orthopedic Surgery

## 2024-12-13 DIAGNOSIS — M868X7 Other osteomyelitis, ankle and foot: Secondary | ICD-10-CM | POA: Insufficient documentation

## 2024-12-13 LAB — VAS US ABI WITH/WO TBI
Left ABI: 0.92
Right ABI: 0.86

## 2024-12-23 ENCOUNTER — Encounter: Payer: Self-pay | Admitting: Orthopedic Surgery

## 2024-12-23 ENCOUNTER — Ambulatory Visit: Admitting: Orthopedic Surgery

## 2024-12-23 DIAGNOSIS — I739 Peripheral vascular disease, unspecified: Secondary | ICD-10-CM

## 2024-12-23 DIAGNOSIS — M869 Osteomyelitis, unspecified: Secondary | ICD-10-CM | POA: Diagnosis not present

## 2024-12-23 MED ORDER — OXYCODONE-ACETAMINOPHEN 5-325 MG PO TABS: 1.0000 | ORAL_TABLET | Freq: Three times a day (TID) | ORAL | 0 refills | Status: DC | PRN

## 2024-12-23 NOTE — Progress Notes (Signed)
 "  Office Visit Note   Patient: Brandon [name]           Date of Birth: [birth year]           MRN: [id number] Visit Date: 12/23/2024              Requested by: Chandra Toribio POUR, MD [address] [place],  [place] [address] PCP: Chandra Toribio POUR, MD  Chief Complaint  Patient presents with   Right Foot - Follow-up      HPI: Discussed the use of AI scribe software for clinical note transcription with the patient, who gave verbal consent to proceed.  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  He has a chronic, non-healing ulcer at the right fifth metatarsal head, complicated by chronic osteomyelitis. He experiences persistent, severe, throbbing pain localized to the affected area, occasionally intense enough to cause vocal outbursts. Pain is managed with oxycodone /acetaminophen , typically half of a 5 mg tablet per dose, and he avoids non-prescribed medications due to his dialysis status. The pain results in significant discomfort and functional limitation.  He is aware of the risk of microcirculatory compromise and impaired wound healing. He notes a family history of similar complications, with his father requiring a foot amputation after failed healing of a small toe amputation. Despite these risks, he is motivated to proceed with surgery to attempt limb salvage.  He undergoes hemodialysis three times weekly and has previously received dialysis at the hospital. He lives alone but receives assistance from a family member for transportation and daily needs. He inquires about expected healing time and wound care.     Assessment & Plan: Visit Diagnoses:  1. PAD (peripheral artery disease)   2. Osteomyelitis of fifth toe of right foot (HCC)     Plan: Assessment and  Plan Assessment & Plan Diabetic foot ulcer with chronic osteomyelitis, right fifth metatarsal Chronic, non-healing Wagner grade 3 ulcer with underlying osteomyelitis. Adequate macrocirculation, uncertain microcirculation, high risk for infection progression and limb loss without surgery. - Reviewed ankle brachial indices confirming adequate circulation for healing. - Discussed necessity of surgical intervention to remove infected bone and control infection.  Planned right fifth toe amputation Indicated to eradicate chronic infection and attempt foot salvage. Significant risk of non-healing may require more proximal amputation. He understands risks and is motivated to proceed. - Scheduled right fifth toe amputation for Friday at [facility]. - Coordinated perioperative dialysis at the hospital on Saturday morning. - Instructed him to remain non-weight bearing postoperatively, utilizing wheelchair and walker as needed to promote wound healing. - Planned for surgical dressing to remain in place for one week postoperatively. - Scheduled follow-up in one week for wound check and x-ray. - Provided wound care instructions after dressing removal (soap and water , gauze).  Chronic pain due to foot ulcer and osteomyelitis Severe pain secondary to chronic ulcer and osteomyelitis, interfering with daily activities. Pain management complicated by dialysis. - Prescribed oxycodone /acetaminophen  (Percocet) 5 mg tablets, with instructions to split for 2.5 mg dosing as previously tolerated. - Sent prescription to [facility] on [address] and [address] [place].      Follow-Up Instructions: Return in about 2 weeks (around 01/06/2025).   Ortho Exam  Patient is alert, oriented, no adenopathy, well-dressed, normal affect, normal respiratory effort. Physical Exam  Imaging: No results found. No images are attached to the encounter.  Labs: Lab Results  Component Value Date   HGBA1C 7.0 07/26/2024    HGBA1C 7.0 (H) 03/28/2024   HGBA1C 8.7 (H) 09/06/2023   ESRSEDRATE 45 (H) 07/08/2021   ESRSEDRATE 28 (H) 02/02/2018   ESRSEDRATE 18 12/29/2016   CRP 1.0 (H) 07/08/2021   CRP 6.7 (H) 04/22/2016   REPTSTATUS 04/06/2020 FINAL 04/05/2020   CULT MULTIPLE SPECIES PRESENT, SUGGEST RECOLLECTION (A) 04/05/2020   LABORGA NO GROWTH 10/21/2016     Lab Results  Component Value Date   ALBUMIN 4.9 07/26/2024   ALBUMIN 3.7 04/08/2024   ALBUMIN 3.7 04/07/2024    Lab Results  Component Value Date   MG 2.2 07/26/2024   MG 2.0 04/08/2024   MG 2.1 04/07/2024   No results found for: VD25OH  No results found for: PREALBUMIN    Latest Ref Rng & Units 07/26/2024   10:37 AM 06/12/2024    9:51 AM 04/08/2024    7:24 AM  CBC EXTENDED  WBC 3.4 - 10.8 x10E3/uL 4.2   4.0   RBC 4.14 - 5.80 x10E6/uL 4.53   3.23   Hemoglobin 13.0 - 17.7 g/dL 85.9  86.6  9.9   HCT 62.4 - 51.0 % 42.7  39.0  29.7   Platelets 150 - 450 x10E3/uL 188   169   NEUT# 1.4 - 7.0 x10E3/uL 1.9   2.2   Lymph# 0.7 - 3.1 x10E3/uL 1.6   1.3      There is no height or weight on file to calculate BMI.  Orders:  No orders of the defined types were placed in this encounter.  Meds ordered this encounter  Medications   oxyCODONE -acetaminophen  (PERCOCET/ROXICET) 5-325 MG tablet    Sig: Take 1 tablet by mouth every 8 (eight) hours as needed.    Dispense:  20 tablet    Refill:  0     Procedures: No procedures performed  Clinical Data: No additional findings.  ROS:  All other systems negative, except as noted in the HPI. Review of Systems  Objective: Vital Signs: There were no vitals taken for this visit.  Specialty Comments:  No specialty comments available.  PMFS History: Patient Active Problem List   Diagnosis Date Noted   Dental caries 07/26/2024   Foot ulcer (HCC) 05/23/2024   Hyperkalemia 04/04/2024   ESRD (end stage renal disease) on dialysis (HCC) 03/28/2024   AV fistula occlusion, initial encounter  03/28/2024   Coagulation defect, unspecified 02/29/2024   Shortness of breath 02/29/2024   Secondary hyperparathyroidism of renal origin 02/28/2024   Other disorders of phosphorus metabolism 11/04/2023   Uremic pruritus 10/05/2023   Chronic bilateral low back pain without sciatica 09/06/2023   Chest pain 02/25/2023   End stage renal disease (HCC) 11/29/2022   Hemodialysis status 11/29/2022   Acute respiratory failure with hypoxia (HCC) 10/02/2022   Hypertensive urgency 10/02/2022   Acute on chronic diastolic CHF (congestive heart failure) (HCC) 10/02/2022   Anemia due to chronic kidney disease 10/02/2022   Cognitive impairment 10/02/2022   Diabetic nephropathy associated with type 2 diabetes mellitus (HCC) 04/28/2022   Iron deficiency anemia 04/28/2022   Obesity (BMI 30-39.9) 04/17/2022   Protein-calorie malnutrition, severe 12/01/2021   Acute renal failure superimposed on stage 4 chronic kidney disease (HCC) 11/30/2021   AMS (altered mental status) 11/29/2021   Hypokalemia 11/29/2021   Callus 10/19/2021   Enlarged and hypertrophic nails 10/19/2021   Open wound of toe 10/19/2021  Mental health disorder 10/19/2021   Delirium 09/21/2021   Benign prostatic hyperplasia 04/24/2021   Muscle weakness 04/24/2021   Hyperlipidemia 04/24/2021   Unsteady 04/24/2021   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) 04/24/2021   Long term (current) use of insulin  (HCC) 04/24/2021   Hyperosmolarity syndrome 04/24/2021   Low back pain 04/24/2021   Epilepsy (HCC) 04/24/2021   Chronic kidney disease due to type 2 diabetes mellitus (HCC) 04/24/2021   Type 2 diabetes mellitus (HCC) 04/24/2021   Polypharmacy 09/18/2020   Anemia 04/16/2020   Anxiety 04/16/2020   Controlled type 2 diabetes mellitus without complication, without long-term current use of insulin  (HCC) 02/09/2020   Type 2 diabetes mellitus with diabetic polyneuropathy, with long-term current use of  insulin  (HCC) 02/09/2020   Family history of premature CAD 01/27/2020   Stage 3b chronic kidney disease (HCC) 01/09/2020   Incontinence of feces 01/09/2020   Chronic pain of left knee 01/09/2020   Fall 01/09/2020   History of diverticulitis 10/30/2019   Noncompliance 06/10/2019   History of renal cell cancer 04/24/2018   Erectile dysfunction 04/24/2018   Edema 03/15/2018   Seizure disorder (HCC) 03/01/2018   History of stroke 11/29/2017   Obstructive sleep apnea 11/29/2017   Acute kidney injury 05/23/2017   Hyperlipidemia associated with type 2 diabetes mellitus (HCC) 05/10/2017   Hypertension associated with diabetes (HCC) 05/10/2017   History of cerebrovascular accident (CVA) with residual deficit 05/10/2017   Diabetic ulcer of left foot associated with secondary diabetes mellitus (HCC) 04/21/2016   Depression, recurrent 09/21/2014   Past Medical History:  Diagnosis Date   Acute ischemic stroke (HCC) 11/2013   Allergy    Anemia    hx   Anxiety    Arthritis    Autism spectrum    high functioning per SO ib 06/11/24   Benign hypertension with CKD (chronic kidney disease) stage III (HCC)    Dialysis T T S   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 ischemic stroke   GERD (gastroesophageal reflux disease)    Hiatal hernia    Hyperlipidemia    Hypertension    Seizures (HCC)    none since 2016, taking Keppra  - maw   Sleep apnea    does not use CPAP   Thyroid  disease    weak thyroid  per MD   Wears glasses     Family History  Problem Relation  Age of Onset   Pneumonia Mother    Breast cancer Mother    Diabetes Father    Heart disease Father    Breast cancer Sister    Prostate cancer Other    Stomach cancer Other    Colon cancer Neg Hx    Esophageal cancer Neg Hx    Rectal cancer Neg Hx     Past Surgical History:  Procedure Laterality Date   A/V SHUNT INTERVENTION Left 06/03/2024   Procedure: A/V SHUNT INTERVENTION;  Surgeon: Magda Debby SAILOR, MD;  Location: [facility];  Service: Cardiovascular;  Laterality: Left;   AV FISTULA PLACEMENT Left 10/14/2022   Procedure: LEFT BRACHIOCAPHALIC FISTULA CREATION;  Surgeon: Gretta Lonni PARAS, MD;  Location: [facility] OR;  Service: Vascular;  Laterality: Left;   BREAST BIOPSY Left 03/24/2023   US  LT BREAST BX W LOC DEV 1ST LESION IMG BX SPEC US  GUIDE 03/24/2023 [facility] MAMMOGRAPHY   COLONOSCOPY     DIALYSIS/PERMA CATHETER INSERTION N/A 04/08/2024   Procedure: DIALYSIS/PERMA CATHETER INSERTION;  Surgeon: Marea Selinda RAMAN, MD;  Location: [facility];  Service: Cardiovascular;  Laterality: N/A;   IR DIALY SHUNT INTRO NEEDLE/INTRACATH INITIAL W/IMG LEFT Left 03/29/2024   IR FLUORO GUIDE CV LINE RIGHT  10/06/2022   IR RADIOLOGIST EVAL & MGMT  04/04/2017   IR US  GUIDE VASC ACCESS RIGHT  10/06/2022   KIDNEY SURGERY     ablation of renal cell CA - 12/28, prior one was October 2012-Dr. Yamagata   MRI  11/2021   brain   REVISON OF ARTERIOVENOUS FISTULA Left 06/12/2024   Procedure: REVISON OF ARTERIOVENOUS FISTULA;  Surgeon: Magda Debby SAILOR, MD;  Location: [facility] OR;  Service: Vascular;  Laterality: Left;   TEE WITHOUT CARDIOVERSION N/A 11/19/2013   Procedure: TRANSESOPHAGEAL ECHOCARDIOGRAM (TEE);  Surgeon: Redell RAMAN Shallow, MD;  Location: [facility] ENDOSCOPY;  Service: Cardiovascular;  Laterality: N/A;   TEMPORARY DIALYSIS CATHETER N/A 04/04/2024   Procedure: TEMPORARY DIALYSIS CATHETER;  Surgeon: Marea Selinda RAMAN, MD;  Location: [facility] INVAS[facility] LAB;  Service: Cardiovascular;  Laterality: N/A;   TUNNELLED  CATHETER EXCHANGE N/A 10/11/2024   Procedure: TUNNELLED CATHETER EXCHANGE;  Surgeon: Serene Gaile ORN, MD;  Location: [facility];  Service: Cardiovascular;  Laterality: N/A;   Social History   Occupational History   Occupation: disabled  Tobacco Use   Smoking status: Never    Passive exposure: Never   Smokeless tobacco: Never  Vaping Use   Vaping status: Never Used  Substance and Sexual Activity   Alcohol use: Not Currently    Comment: rarely   Drug use: No   Sexual activity: Not Currently         "

## 2024-12-26 ENCOUNTER — Other Ambulatory Visit: Payer: Self-pay

## 2024-12-26 ENCOUNTER — Encounter (HOSPITAL_COMMUNITY): Payer: Self-pay | Admitting: Orthopedic Surgery

## 2024-12-26 NOTE — Progress Notes (Signed)
 Spoke with the pt, he will arrive tom at 0630. NPO post mn, and he will stop clear liquids at 0600.

## 2024-12-26 NOTE — H&P (Signed)
 Brandon [name] is an 64 y.o. male.   Chief Complaint: chronic osteomyelitis right fifth toe HPI: Brandon [name] is a 64 year old male with end-stage renal disease on dialysis and a Wagner grade 3 diabetic foot ulcer with chronic osteomyelitis who presents for follow-up and surgical planning for right fifth toe amputation.  He has DM and ESRD.    Past Medical History:  Diagnosis Date   Acute ischemic stroke (HCC) 11/2013   Allergy    Anemia    hx   Anxiety    Arthritis    Autism spectrum    high functioning per SO ib 06/11/24   Benign hypertension with CKD (chronic kidney disease) stage III (HCC)    Dialysis T T S   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 ischemic stroke   GERD (gastroesophageal reflux disease)    Hiatal hernia    Hyperlipidemia    Hypertension    Seizures (HCC)    none since 2016, taking Keppra  - maw   Sleep apnea    does not use CPAP   Thyroid  disease    weak thyroid  per MD   Wears glasses     Past Surgical History:  Procedure Laterality Date   A/V SHUNT INTERVENTION Left 06/03/2024   Procedure: A/V SHUNT INTERVENTION;  Surgeon: Magda Debby SAILOR, MD;  Location: [facility];  Service: Cardiovascular;  Laterality: Left;   AV FISTULA PLACEMENT Left 10/14/2022   Procedure: LEFT BRACHIOCAPHALIC FISTULA CREATION;  Surgeon: Gretta Lonni PARAS, MD;  Location: [facility] OR;  Service: Vascular;  Laterality: Left;   BREAST BIOPSY Left 03/24/2023   US  LT BREAST BX W LOC DEV 1ST LESION IMG BX SPEC US  GUIDE 03/24/2023 [facility] MAMMOGRAPHY    COLONOSCOPY     DIALYSIS/PERMA CATHETER INSERTION N/A 04/08/2024   Procedure: DIALYSIS/PERMA CATHETER INSERTION;  Surgeon: Marea Selinda RAMAN, MD;  Location: [facility];  Service: Cardiovascular;  Laterality: N/A;   IR DIALY SHUNT INTRO NEEDLE/INTRACATH INITIAL W/IMG LEFT Left 03/29/2024   IR FLUORO GUIDE CV LINE RIGHT  10/06/2022   IR RADIOLOGIST EVAL & MGMT  04/04/2017   IR US  GUIDE VASC ACCESS RIGHT  10/06/2022   KIDNEY SURGERY     ablation of renal cell CA - 12/28, prior one was October 2012-Dr. Yamagata   MRI  11/2021   brain   REVISON OF ARTERIOVENOUS FISTULA Left 06/12/2024   Procedure: REVISON OF ARTERIOVENOUS FISTULA;  Surgeon: Magda Debby SAILOR, MD;  Location: [facility] OR;  Service: Vascular;  Laterality: Left;   TEE WITHOUT CARDIOVERSION N/A 11/19/2013   Procedure: TRANSESOPHAGEAL ECHOCARDIOGRAM (TEE);  Surgeon: Redell RAMAN Shallow, MD;  Location: [facility] ENDOSCOPY;  Service: Cardiovascular;  Laterality: N/A;   TEMPORARY DIALYSIS CATHETER N/A 04/04/2024   Procedure: TEMPORARY DIALYSIS CATHETER;  Surgeon: Marea Selinda RAMAN, MD;  Location: [facility];  Service: Cardiovascular;  Laterality: N/A;   TUNNELLED CATHETER EXCHANGE N/A 10/11/2024   Procedure: TUNNELLED CATHETER EXCHANGE;  Surgeon:  Serene Gaile ORN, MD;  Location: [facility];  Service: Cardiovascular;  Laterality: N/A;    Family History  Problem Relation Age of Onset   Pneumonia Mother    Breast cancer Mother    Diabetes Father    Heart disease Father    Breast cancer Sister    Prostate cancer Other    Stomach cancer Other    Colon cancer Neg Hx    Esophageal cancer Neg Hx    Rectal cancer Neg Hx    Social History:  reports that he has never smoked. He has never been exposed to tobacco smoke. He has never used smokeless tobacco. He reports that he does not currently use alcohol. He reports that he does not use drugs.  Allergies: Allergies[1]  No medications prior to admission.    No results found for this or any  previous visit (from the past 48 hours). No results found.  Review of Systems  All other systems reviewed and are negative.   There were no vitals taken for this visit. Physical Exam  Patient is alert, oriented, no adenopathy, well-dressed, normal affect, normal respiratory effort.  ulcer at the right fifth metatarsal head  No ascending cellulitis. No blisters. Ulcer does not probe to bone. Wagner grade 3 ulceration.  Doppler: strong biphasic posterior tibial pulse, dampened monophasic dorsalis pedis pulse   12/13/2024 ABI/TBIToday's ABIToday's TBIPrevious ABIPrevious TBI  +-------+-----------+-----------+------------+------------+  Right 0.86       0.93       1.24        1.13          +-------+-----------+-----------+------------+------------+  Left  0.92       0.83       1.22        1.03          +-------+-----------+-----------+------------+------------+    Assessment/Plan Diabetic foot ulcer with chronic osteomyelitis, right fifth metatarsal Chronic, non-healing Wagner grade 3 ulcer with underlying osteomyelitis. Adequate macrocirculation, uncertain microcirculation, high risk for infection progression and limb loss without surgery. - Reviewed ankle brachial indices confirming adequate circulation for healing. - Discussed necessity of surgical intervention to remove infected bone and control infection.   Planned right fifth toe amputation  Maurilio Deland Collet, PA-C 12/26/2024, 11:25 AM       [1]  Allergies Allergen Reactions   Apresoline  [Hydralazine ] Other (See Comments)    Chest pain if by mouth- can tolerate it via IV   Imdur  [Isosorbide  Nitrate] Other (See Comments)    Headaches   Ms Contin  [Morphine ] Itching

## 2024-12-26 NOTE — Progress Notes (Signed)
 SDW CALL  Patient was given pre-op  instructions over the phone. The opportunity was given for the patient to ask questions. No further questions asked. Patient verbalized understanding of instructions given.   PCP - Chandra Toribio POUR, MD  Cardiologist - none Nephrologist - Munsoor Marcelino, MD PPM/ICD - denies Device Orders -  Rep Notified -   Chest x-ray - 1 v-04/04/24 EKG - 08/23/24 Stress Test -  ECHO - 10/03/22 Cardiac Cath -   Sleep Study - 02/23/17 +OSA CPAP - pt does not use  Fasting Blood Sugar - 128 Checks Blood Sugar - pt does not check his blood sugar at home and says his meter is broken.He says his blood sugar is checked at dialysis.  Blood Thinner Instructions:na  Aspirin  Instructions:na  ERAS Protcol - clear liquids until 0430 PRE-SURGERY Ensure or G2- no  COVID TEST- na   Anesthesia review: yes-hx CHF,HTN,ESRD on HD,stroke,DM,PAD   Patient denies shortness of breath, fever, cough and chest pain over the phone call  Oral Hygiene is also important to reduce your risk of infection.  Remember - BRUSH YOUR TEETH THE MORNING OF SURGERY WITH YOUR REGULAR TOOTHPASTE

## 2024-12-27 ENCOUNTER — Other Ambulatory Visit: Payer: Self-pay

## 2024-12-27 ENCOUNTER — Observation Stay (HOSPITAL_COMMUNITY)
Admission: RE | Admit: 2024-12-27 | Discharge: 2024-12-28 | Disposition: A | Attending: Orthopedic Surgery | Admitting: Orthopedic Surgery

## 2024-12-27 ENCOUNTER — Encounter (HOSPITAL_COMMUNITY): Payer: Self-pay | Admitting: Orthopedic Surgery

## 2024-12-27 ENCOUNTER — Ambulatory Visit (HOSPITAL_COMMUNITY): Payer: Self-pay | Admitting: Physician Assistant

## 2024-12-27 ENCOUNTER — Encounter (HOSPITAL_COMMUNITY): Admission: RE | Disposition: A | Payer: Self-pay | Source: Home / Self Care | Attending: Orthopedic Surgery

## 2024-12-27 DIAGNOSIS — I509 Heart failure, unspecified: Secondary | ICD-10-CM | POA: Insufficient documentation

## 2024-12-27 DIAGNOSIS — I110 Hypertensive heart disease with heart failure: Secondary | ICD-10-CM

## 2024-12-27 DIAGNOSIS — Z8673 Personal history of transient ischemic attack (TIA), and cerebral infarction without residual deficits: Secondary | ICD-10-CM | POA: Diagnosis not present

## 2024-12-27 DIAGNOSIS — Z992 Dependence on renal dialysis: Secondary | ICD-10-CM | POA: Diagnosis not present

## 2024-12-27 DIAGNOSIS — T148XXA Other injury of unspecified body region, initial encounter: Secondary | ICD-10-CM | POA: Diagnosis not present

## 2024-12-27 DIAGNOSIS — E1122 Type 2 diabetes mellitus with diabetic chronic kidney disease: Secondary | ICD-10-CM | POA: Insufficient documentation

## 2024-12-27 DIAGNOSIS — Z79899 Other long term (current) drug therapy: Secondary | ICD-10-CM | POA: Diagnosis not present

## 2024-12-27 DIAGNOSIS — M869 Osteomyelitis, unspecified: Secondary | ICD-10-CM | POA: Diagnosis not present

## 2024-12-27 DIAGNOSIS — X58XXXA Exposure to other specified factors, initial encounter: Secondary | ICD-10-CM | POA: Diagnosis not present

## 2024-12-27 DIAGNOSIS — E11621 Type 2 diabetes mellitus with foot ulcer: Secondary | ICD-10-CM | POA: Diagnosis not present

## 2024-12-27 DIAGNOSIS — I130 Hypertensive heart and chronic kidney disease with heart failure and stage 1 through stage 4 chronic kidney disease, or unspecified chronic kidney disease: Secondary | ICD-10-CM | POA: Diagnosis not present

## 2024-12-27 DIAGNOSIS — L97519 Non-pressure chronic ulcer of other part of right foot with unspecified severity: Secondary | ICD-10-CM | POA: Insufficient documentation

## 2024-12-27 DIAGNOSIS — Z794 Long term (current) use of insulin: Secondary | ICD-10-CM | POA: Insufficient documentation

## 2024-12-27 DIAGNOSIS — N186 End stage renal disease: Secondary | ICD-10-CM | POA: Insufficient documentation

## 2024-12-27 DIAGNOSIS — E1169 Type 2 diabetes mellitus with other specified complication: Secondary | ICD-10-CM

## 2024-12-27 DIAGNOSIS — I5033 Acute on chronic diastolic (congestive) heart failure: Secondary | ICD-10-CM | POA: Diagnosis not present

## 2024-12-27 DIAGNOSIS — N183 Chronic kidney disease, stage 3 unspecified: Secondary | ICD-10-CM | POA: Insufficient documentation

## 2024-12-27 DIAGNOSIS — M86671 Other chronic osteomyelitis, right ankle and foot: Secondary | ICD-10-CM | POA: Diagnosis present

## 2024-12-27 DIAGNOSIS — L089 Local infection of the skin and subcutaneous tissue, unspecified: Principal | ICD-10-CM | POA: Diagnosis present

## 2024-12-27 LAB — GLUCOSE, CAPILLARY
Glucose-Capillary: 77 mg/dL (ref 70–99)
Glucose-Capillary: 54 mg/dL — ABNORMAL LOW (ref 70–99)
Glucose-Capillary: 52 mg/dL — ABNORMAL LOW (ref 70–99)
Glucose-Capillary: 69 mg/dL — ABNORMAL LOW (ref 70–99)
Glucose-Capillary: 74 mg/dL (ref 70–99)

## 2024-12-27 LAB — POCT I-STAT, CHEM 8
BUN: 74 mg/dL — ABNORMAL HIGH (ref 8–23)
Calcium, Ion: 0.82 mmol/L — CL (ref 1.15–1.40)
Chloride: 97 mmol/L — ABNORMAL LOW (ref 98–111)
Creatinine, Ser: 17 mg/dL — ABNORMAL HIGH (ref 0.61–1.24)
Glucose, Bld: 75 mg/dL (ref 70–99)
HCT: 39 % (ref 39.0–52.0)
Hemoglobin: 13.3 g/dL (ref 13.0–17.0)
Potassium: 5.4 mmol/L — ABNORMAL HIGH (ref 3.5–5.1)
Sodium: 138 mmol/L (ref 135–145)
TCO2: 27 mmol/L (ref 22–32)

## 2024-12-27 LAB — HEPATITIS B SURFACE ANTIGEN: Hepatitis B Surface Ag: NONREACTIVE

## 2024-12-27 MED ORDER — PROPOFOL 500 MG/50ML IV EMUL
INTRAVENOUS | Status: DC | PRN
  Administered 2024-12-27: 80 ug/kg/min via INTRAVENOUS

## 2024-12-27 MED ORDER — FENTANYL CITRATE (PF) 100 MCG/2ML IJ SOLN: 100.0000 ug | Freq: Once | INTRAMUSCULAR | Status: AC

## 2024-12-27 MED ORDER — HYDROMORPHONE HCL 1 MG/ML IJ SOLN: 0.5000 mg | INTRAMUSCULAR | Status: DC | PRN

## 2024-12-27 MED ORDER — ONDANSETRON HCL 4 MG/2ML IJ SOLN: 4.0000 mg | Freq: Once | INTRAMUSCULAR | Status: DC | PRN

## 2024-12-27 MED ORDER — ONDANSETRON HCL 4 MG/2ML IJ SOLN
INTRAMUSCULAR | Status: DC | PRN
  Administered 2024-12-27: 4 mg via INTRAVENOUS

## 2024-12-27 MED ORDER — HYDROMORPHONE HCL 1 MG/ML IJ SOLN
INTRAMUSCULAR | Status: AC
  Filled 2024-12-27: qty 1

## 2024-12-27 MED ORDER — CEFAZOLIN SODIUM-DEXTROSE 2-4 GM/100ML-% IV SOLN
2.0000 g | INTRAVENOUS | Status: AC
  Administered 2024-12-27: 2 g via INTRAVENOUS
  Filled 2024-12-27: qty 100

## 2024-12-27 MED ORDER — HYDROCODONE-ACETAMINOPHEN 7.5-325 MG PO TABS
1.0000 | ORAL_TABLET | ORAL | Status: DC | PRN
  Administered 2024-12-27 – 2024-12-28 (×5): 2 via ORAL
  Filled 2024-12-27 (×4): qty 2

## 2024-12-27 MED ORDER — ACETAMINOPHEN 325 MG PO TABS: 325.0000 mg | ORAL_TABLET | Freq: Four times a day (QID) | ORAL | Status: DC | PRN

## 2024-12-27 MED ORDER — ANTICOAGULANT SODIUM CITRATE 4% (200MG/5ML) IV SOLN: 5.0000 mL | Status: DC | PRN

## 2024-12-27 MED ORDER — DOCUSATE SODIUM 100 MG PO CAPS
100.0000 mg | ORAL_CAPSULE | Freq: Two times a day (BID) | ORAL | Status: DC
  Administered 2024-12-28: 100 mg via ORAL
  Filled 2024-12-27 (×3): qty 1

## 2024-12-27 MED ORDER — SODIUM CHLORIDE 0.9 % IV SOLN: INTRAVENOUS | Status: DC

## 2024-12-27 MED ORDER — INSULIN GLARGINE 100 UNIT/ML ~~LOC~~ SOLN: 15.0000 [IU] | Freq: Two times a day (BID) | SUBCUTANEOUS | Status: DC

## 2024-12-27 MED ORDER — FENTANYL CITRATE (PF) 250 MCG/5ML IJ SOLN
INTRAMUSCULAR | Status: AC
  Filled 2024-12-27: qty 5

## 2024-12-27 MED ORDER — HEPARIN SODIUM (PORCINE) 1000 UNIT/ML IJ SOLN
INTRAMUSCULAR | Status: AC
  Filled 2024-12-27: qty 8

## 2024-12-27 MED ORDER — CEFAZOLIN SODIUM-DEXTROSE 1-4 GM/50ML-% IV SOLN: 1.0000 g | Freq: Three times a day (TID) | INTRAVENOUS | Status: DC

## 2024-12-27 MED ORDER — OXYCODONE HCL 5 MG/5ML PO SOLN: 5.0000 mg | Freq: Once | ORAL | Status: DC | PRN

## 2024-12-27 MED ORDER — AMLODIPINE BESYLATE 10 MG PO TABS
10.0000 mg | ORAL_TABLET | Freq: Every day | ORAL | Status: DC
  Administered 2024-12-27 – 2024-12-28 (×2): 10 mg via ORAL
  Filled 2024-12-27 (×2): qty 1

## 2024-12-27 MED ORDER — MIDAZOLAM HCL 2 MG/2ML IJ SOLN
INTRAMUSCULAR | Status: AC
  Filled 2024-12-27: qty 2

## 2024-12-27 MED ORDER — HEPARIN SODIUM (PORCINE) 1000 UNIT/ML DIALYSIS
2500.0000 [IU] | Freq: Once | INTRAMUSCULAR | Status: AC
  Administered 2024-12-27: 2500 [IU] via INTRAVENOUS_CENTRAL

## 2024-12-27 MED ORDER — ACETAMINOPHEN 500 MG PO TABS
500.0000 mg | ORAL_TABLET | Freq: Four times a day (QID) | ORAL | Status: AC
  Administered 2024-12-27 (×2): 500 mg via ORAL
  Filled 2024-12-27 (×2): qty 1

## 2024-12-27 MED ORDER — ORAL CARE MOUTH RINSE: 15.0000 mL | Freq: Once | OROMUCOSAL | Status: AC

## 2024-12-27 MED ORDER — PROPOFOL 10 MG/ML IV BOLUS
INTRAVENOUS | Status: DC | PRN
  Administered 2024-12-27: 30 mg via INTRAVENOUS

## 2024-12-27 MED ORDER — NEPRO/CARBSTEADY PO LIQD: 237.0000 mL | ORAL | Status: DC | PRN

## 2024-12-27 MED ORDER — CALCIUM ACETATE (PHOS BINDER) 667 MG PO CAPS
1334.0000 mg | ORAL_CAPSULE | Freq: Three times a day (TID) | ORAL | Status: DC
  Administered 2024-12-27 – 2024-12-28 (×4): 1334 mg via ORAL
  Filled 2024-12-27 (×5): qty 2

## 2024-12-27 MED ORDER — ACETAMINOPHEN 500 MG PO TABS
ORAL_TABLET | ORAL | Status: AC
  Filled 2024-12-27: qty 1

## 2024-12-27 MED ORDER — ONDANSETRON HCL 4 MG PO TABS: 4.0000 mg | ORAL_TABLET | Freq: Four times a day (QID) | ORAL | Status: DC | PRN

## 2024-12-27 MED ORDER — MIDAZOLAM HCL 2 MG/2ML IJ SOLN
INTRAMUSCULAR | Status: AC
  Administered 2024-12-27: 1 mg via INTRAVENOUS
  Filled 2024-12-27: qty 2

## 2024-12-27 MED ORDER — OXYCODONE HCL 5 MG PO TABS: 5.0000 mg | ORAL_TABLET | Freq: Once | ORAL | Status: DC | PRN

## 2024-12-27 MED ORDER — ROPIVACAINE HCL 5 MG/ML IJ SOLN
INTRAMUSCULAR | Status: DC | PRN
  Administered 2024-12-27: 30 mL via PERINEURAL

## 2024-12-27 MED ORDER — LIDOCAINE-PRILOCAINE 2.5-2.5 % EX CREA: 1.0000 | TOPICAL_CREAM | CUTANEOUS | Status: DC | PRN

## 2024-12-27 MED ORDER — SERTRALINE HCL 100 MG PO TABS
100.0000 mg | ORAL_TABLET | Freq: Every day | ORAL | Status: DC
  Administered 2024-12-28: 100 mg via ORAL
  Filled 2024-12-27 (×2): qty 1

## 2024-12-27 MED ORDER — HYDROMORPHONE HCL 1 MG/ML IJ SOLN
0.2500 mg | INTRAMUSCULAR | Status: DC | PRN
  Administered 2024-12-27 (×4): 0.5 mg via INTRAVENOUS

## 2024-12-27 MED ORDER — PENTAFLUOROPROP-TETRAFLUOROETH EX AERO: 1.0000 | INHALATION_SPRAY | CUTANEOUS | Status: DC | PRN

## 2024-12-27 MED ORDER — MIDAZOLAM HCL (PF) 2 MG/2ML IJ SOLN: 1.0000 mg | Freq: Once | INTRAMUSCULAR | Status: AC

## 2024-12-27 MED ORDER — FENTANYL CITRATE (PF) 100 MCG/2ML IJ SOLN
INTRAMUSCULAR | Status: AC
  Administered 2024-12-27: 100 ug via INTRAVENOUS
  Filled 2024-12-27: qty 2

## 2024-12-27 MED ORDER — ONDANSETRON HCL 4 MG/2ML IJ SOLN
INTRAMUSCULAR | Status: AC
  Filled 2024-12-27: qty 2

## 2024-12-27 MED ORDER — CHLORHEXIDINE GLUCONATE 0.12 % MT SOLN
15.0000 mL | Freq: Once | OROMUCOSAL | Status: AC
  Administered 2024-12-27: 15 mL via OROMUCOSAL
  Filled 2024-12-27: qty 15

## 2024-12-27 MED ORDER — HEPARIN SODIUM (PORCINE) 1000 UNIT/ML DIALYSIS
1500.0000 [IU] | INTRAMUSCULAR | Status: AC | PRN
  Administered 2024-12-27: 1500 [IU] via INTRAVENOUS_CENTRAL

## 2024-12-27 MED ORDER — CHLORHEXIDINE GLUCONATE CLOTH 2 % EX PADS: 6.0000 | MEDICATED_PAD | Freq: Every day | CUTANEOUS | Status: DC

## 2024-12-27 MED ORDER — HYDROCODONE-ACETAMINOPHEN 7.5-325 MG PO TABS
ORAL_TABLET | ORAL | Status: AC
  Filled 2024-12-27: qty 2

## 2024-12-27 MED ORDER — ALTEPLASE 2 MG IJ SOLR: 2.0000 mg | Freq: Once | INTRAMUSCULAR | Status: DC | PRN

## 2024-12-27 MED ORDER — LIDOCAINE HCL (PF) 1 % IJ SOLN: 5.0000 mL | INTRAMUSCULAR | Status: DC | PRN

## 2024-12-27 MED ORDER — INSULIN ASPART 100 UNIT/ML IJ SOLN
0.0000 [IU] | Freq: Three times a day (TID) | INTRAMUSCULAR | Status: DC
  Administered 2024-12-28: 1 [IU] via SUBCUTANEOUS
  Filled 2024-12-27: qty 1

## 2024-12-27 MED ORDER — HYDROMORPHONE HCL 1 MG/ML IJ SOLN
1.0000 mg | INTRAMUSCULAR | Status: DC | PRN
  Administered 2024-12-27 (×2): 1 mg via INTRAVENOUS

## 2024-12-27 MED ORDER — VASHE WOUND IRRIGATION OPTIME
TOPICAL | Status: DC | PRN
  Administered 2024-12-27: 34 [oz_av]

## 2024-12-27 MED ORDER — MORPHINE SULFATE (PF) 2 MG/ML IV SOLN: 0.5000 mg | INTRAVENOUS | Status: DC | PRN

## 2024-12-27 MED ORDER — HEPARIN SODIUM (PORCINE) 1000 UNIT/ML DIALYSIS
1000.0000 [IU] | INTRAMUSCULAR | Status: DC | PRN
  Administered 2024-12-28: 4200 [IU]

## 2024-12-27 MED ORDER — PROPOFOL 10 MG/ML IV BOLUS
INTRAVENOUS | Status: AC
  Filled 2024-12-27: qty 20

## 2024-12-27 MED ORDER — ONDANSETRON HCL 4 MG/2ML IJ SOLN: 4.0000 mg | Freq: Four times a day (QID) | INTRAMUSCULAR | Status: DC | PRN

## 2024-12-27 MED ORDER — DIPHENHYDRAMINE HCL 25 MG PO CAPS: 25.0000 mg | ORAL_CAPSULE | Freq: Four times a day (QID) | ORAL | Status: DC | PRN

## 2024-12-27 MED ORDER — HYDROCODONE-ACETAMINOPHEN 5-325 MG PO TABS: 1.0000 | ORAL_TABLET | ORAL | Status: DC | PRN

## 2024-12-27 NOTE — Anesthesia Postprocedure Evaluation (Signed)
"   Anesthesia Post Note  Patient: Brandon [name]  Procedure(s) Performed: RIGHT FIFTH RAY AMPUTATION (Right)     Patient location during evaluation: PACU Anesthesia Type: Regional and MAC Level of consciousness: awake and alert Pain management: pain level controlled Vital Signs Assessment: post-procedure vital signs reviewed and stable Respiratory status: spontaneous breathing, nonlabored ventilation and respiratory function stable Cardiovascular status: stable and blood pressure returned to baseline Postop Assessment: no apparent nausea or vomiting Anesthetic complications: no   No notable events documented.  Last Vitals:  Vitals:   12/27/24 0900 12/27/24 0915  BP: 120/78 (!) 154/95  Pulse: 64 70  Resp: 10 19  Temp:    SpO2: 100% 100%    Last Pain:  Vitals:   12/27/24 0915  TempSrc:   PainSc: 7                  Broughton Eppinger A.      "

## 2024-12-27 NOTE — Interval H&P Note (Signed)
 History and Physical Interval Note:  12/27/2024 6:45 AM  Brandon [name]  has presented today for surgery, with the diagnosis of Osteomyelitis Right Little Toe.  The various methods of treatment have been discussed with the patient and family. After consideration of risks, benefits and other options for treatment, the patient has consented to  Procedures with comments: AMPUTATION, FOOT, RAY (Right) - RIGHT 5TH RAY AMPUTATION as a surgical intervention.  The patient's history has been reviewed, patient examined, no change in status, stable for surgery.  I have reviewed the patient's chart and labs.  Questions were answered to the patient's satisfaction.     Blyss Lugar V Emslee Lopezmartinez

## 2024-12-27 NOTE — Progress Notes (Signed)
 PHARMACY NOTE:  ANTIMICROBIAL RENAL DOSAGE ADJUSTMENT  Current antimicrobial regimen includes a mismatch between antimicrobial dosage and estimated renal function.  As per policy approved by the Pharmacy & Therapeutics and Medical Executive Committees, the antimicrobial dosage will be adjusted accordingly.  Current antimicrobial dosage:  Cefazolin  1 gm IV q8h x 1 dose post-op  Indication: surgical prophylaxis  Renal Function:  Estimated Creatinine Clearance: 5.6 mL/min (A) (by C-G formula based on SCr of 17 mg/dL (H)). [x]      On intermittent HD, scheduled: 1/23 at 11am after discharge []      On CRRT    Antimicrobial dosage has been changed to:  no post-op dose needed  Additional comments: - Cefazolin  2gm IV given pre-op  at 0816.  Will provide coverage until next HD session.   Thank you for allowing pharmacy to be a part of this patient's care.  Genaro Zebedee Calin, RPh 12/27/2024 1:30 PM

## 2024-12-27 NOTE — Anesthesia Preprocedure Evaluation (Addendum)
 "                                  Anesthesia Evaluation  Patient identified by MRN, date of birth, ID band Patient awake    Reviewed: Allergy & Precautions, NPO status , Patient's Chart, lab work & pertinent test results  Airway Mallampati: II  TM Distance: >3 FB     Dental  (+) Edentulous Upper, Edentulous Lower   Pulmonary shortness of breath and with exertion, sleep apnea    Pulmonary exam normal breath sounds clear to auscultation       Cardiovascular hypertension, Pt. on medications +CHF  Normal cardiovascular exam Rhythm:Regular Rate:Normal  EKG 08/2024 NSR, Borderline prolonged PR, Low voltage, Poor R wave progression, incomplete LBBB  Echo 10/03/22 1. Left ventricular ejection fraction, by estimation, is 50 to 55%. The  left ventricle has low normal function. Left ventricular endocardial  border not optimally defined to evaluate regional wall motion. There is  moderate left ventricular hypertrophy.  Left ventricular diastolic parameters were low normal for age.   2. Right ventricular systolic function is normal. The right ventricular  size is normal. There is normal pulmonary artery systolic pressure. The  estimated right ventricular systolic pressure is 35.2 mmHg.   3. The mitral valve is normal in structure. Trivial mitral valve  regurgitation. No evidence of mitral stenosis.   4. The aortic valve is grossly normal. Aortic valve regurgitation is not  visualized. No aortic stenosis is present.   5. The inferior vena cava is dilated in size with >50% respiratory  variability, suggesting right atrial pressure of 8 mmHg.     Neuro/Psych Seizures -,  PSYCHIATRIC DISORDERS Anxiety Depression Bipolar Disorder   Peripheral neuropathy  Neuromuscular disease CVA, Residual Symptoms    GI/Hepatic Neg liver ROS, hiatal hernia,GERD  Medicated,,  Endo/Other  diabetes, Poorly Controlled, Type 2, Insulin  Dependent  HLD Obesity Non compliant  Renal/GU Renal  diseaseLast dialysis 1/21  Lab Results      Component                Value               Date                      NA                       138                 12/27/2024                CL                       97 (L)              12/27/2024                K                        5.4 (H)             12/27/2024                CO2                      21 (L)  08/23/2024                BUN                      74 (H)              12/27/2024                CREATININE               17.00 (H)           12/27/2024                GFRNONAA                 2 (L)               08/23/2024                CALCIUM                   7.0 (L)             08/23/2024                PHOS                     5.5 (H)             07/26/2024                ALBUMIN                  4.9                 07/26/2024                GLUCOSE                  75                  12/27/2024             negative genitourinary   Musculoskeletal  (+) Arthritis , Osteoarthritis,  Osteomyelitis right 5th toe   Abdominal  (+) + obese  Peds  Hematology  (+) Blood dyscrasia, anemia Lab Results      Component                Value               Date                      WBC                      4.2                 07/26/2024                HGB                      13.3                12/27/2024                HCT                      39.0                12/27/2024  MCV                      94                  07/26/2024                PLT                      188                 07/26/2024              Anesthesia Other Findings   Reproductive/Obstetrics                              Anesthesia Physical Anesthesia Plan  ASA: 4  Anesthesia Plan: MAC and Regional   Post-op Pain Management: Regional block* and Minimal or no pain anticipated   Induction: Intravenous  PONV Risk Score and Plan: 2 and Treatment may vary due to age or medical condition and Propofol   infusion  Airway Management Planned: Natural Airway and Simple Face Mask  Additional Equipment: None  Intra-op Plan:   Post-operative Plan:   Informed Consent: I have reviewed the patients History and Physical, chart, labs and discussed the procedure including the risks, benefits and alternatives for the proposed anesthesia with the patient or authorized representative who has indicated his/her understanding and acceptance.       Plan Discussed with: CRNA and Anesthesiologist  Anesthesia Plan Comments:          Anesthesia Quick Evaluation  "

## 2024-12-27 NOTE — Plan of Care (Signed)

## 2024-12-27 NOTE — Progress Notes (Signed)
 Contacted by nephrologist with request for pt to receive out-pt HD treatment at pt's clinic tomorrow after d/c. Contacted [facility] [employer] [facility]. Clinic can treat pt tomorrow. Pt will need to arrive at 10:45 am for 11:00 am chair time. Clinic did advise navigator that pt has not received treatment at clinic since last Saturday. Clinic requested that pt's labs from today please be faxed to clinic for review. This info was passed along to nephrologist, renal PA, and attending's PA. Navigator added HD appt for tomorrow to pt's AVS. Above staff aware of HD appt arrangements for tomorrow. Will assist as needed.   Randine Mungo Dialysis Navigator [phone]

## 2024-12-27 NOTE — Inpatient Diabetes Management (Signed)
 Inpatient Diabetes Program Recommendations  AACE/ADA: New Consensus Statement on Inpatient Glycemic Control   Target Ranges:  Prepandial:   less than 140 mg/dL      Peak postprandial:   less than 180 mg/dL (1-2 hours)      Critically ill patients:  140 - 180 mg/dL   Lab Results  Component Value Date   GLUCAP 54 (L) 12/27/2024   HGBA1C 7.0 07/26/2024    Latest Reference Range & Units 12/27/24 06:50 12/27/24 13:00  Glucose-Capillary 70 - 99 mg/dL 77 54 (L)   Review of Glycemic Control  Diabetes history: DM2  Outpatient Diabetes medications:  Lantus  25 units BID  Novolog  0-25 units TID (Not taking)   Current orders for Inpatient glycemic control:  Lantus  15 units BID  Novolog  0-9 units TID   Inpatient Diabetes Program Recommendations:   Noted hypoglycemia.   Please consider - Change correction to Novolog  0-9 units Q4HRS  - Discontinue Lantus  15 units BID today.  Recommend possibly restarting Lantus  tomorrow or when CBG is trending above 180mg /dL   Thanks,  Lavanda Search, RN, MSN, [facility]  Inpatient Diabetes Coordinator  Pager [phone] (8a-5p)

## 2024-12-27 NOTE — Progress Notes (Signed)
 Dr. Jerrye made aware of today's ISTAT result. Potassium 5.4. Ok per Dr. Jerrye. No new orders received.

## 2024-12-27 NOTE — Progress Notes (Signed)
 [facility]    - Changing correction to Novolog  0-9 units Q4HRS  - Discontinue Lantus  15 units BID until CBG is trending above 180mg /dL   Maurilio Deland Collet PA-C

## 2024-12-27 NOTE — Consult Note (Signed)
 Renal Service Consult Note [facility] Lamar JONETTA Fret, MD  Patient: Brandon [name] Date: 12/27/2024 Requesting Physician: Dr. Harden  Reason for Consult: ESRD pt w/  HPI: The patient is a 64 y.o. year-old w/ PMH as below who was admitted for R little toe ray amputation which was done today by Dr Harden. Pt has esrd on HD.  We are asked to see for dialysis.    Pt seen in PACU. Pt is pleasant, just had toe amp surgery. Denies any SOB, CP recently. Tries to make all his HD sessions. Last HD was on Tuesday (TTS in [place]). No leg swelling. [name] is 3-6 kg at his OP unit.    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 ischemic stroke   GERD (gastroesophageal reflux disease)    Hiatal hernia    Hyperlipidemia    Hypertension    Seizures (HCC)    none since 2016, taking Keppra  - maw   Sleep apnea    does not use CPAP   Thyroid  disease    weak thyroid  per MD   Wears glasses    Past Surgical History  Past Surgical History:  Procedure Laterality Date   A/V SHUNT INTERVENTION Left 06/03/2024   Procedure: A/V SHUNT INTERVENTION;  Surgeon:  Magda Debby SAILOR, MD;  Location: [facility];  Service: Cardiovascular;  Laterality: Left;   AV FISTULA PLACEMENT Left 10/14/2022   Procedure: LEFT BRACHIOCAPHALIC FISTULA CREATION;  Surgeon: Gretta Lonni PARAS, MD;  Location: [facility] OR;  Service: Vascular;  Laterality: Left;   BREAST BIOPSY Left 03/24/2023   US  LT BREAST BX W LOC DEV 1ST LESION IMG BX SPEC US  GUIDE 03/24/2023 [facility] MAMMOGRAPHY   COLONOSCOPY     DIALYSIS/PERMA CATHETER INSERTION N/A 04/08/2024   Procedure: DIALYSIS/PERMA CATHETER INSERTION;  Surgeon: Marea Selinda RAMAN, MD;  Location: [facility] INVASIVE CV LAB;  Service: Cardiovascular;  Laterality: N/A;   IR DIALY SHUNT INTRO NEEDLE/INTRACATH INITIAL W/IMG LEFT Left 03/29/2024   IR FLUORO GUIDE CV LINE RIGHT  10/06/2022   IR RADIOLOGIST EVAL & MGMT  04/04/2017   IR US  GUIDE VASC ACCESS RIGHT  10/06/2022   KIDNEY SURGERY     ablation of renal cell CA - 12/28, prior one was October 2012-Dr. Yamagata   MRI  11/2021   brain   REVISON OF ARTERIOVENOUS FISTULA Left 06/12/2024   Procedure: REVISON OF ARTERIOVENOUS FISTULA;  Surgeon: Magda Debby SAILOR, MD;  Location: [facility] OR;  Service: Vascular;  Laterality: Left;   TEE WITHOUT CARDIOVERSION N/A 11/19/2013   Procedure: TRANSESOPHAGEAL ECHOCARDIOGRAM (TEE);  Surgeon: Redell GORMAN Shallow, MD;  Location: [facility] ENDOSCOPY;  Service: Cardiovascular;  Laterality: N/A;   TEMPORARY DIALYSIS CATHETER N/A 04/04/2024   Procedure: TEMPORARY DIALYSIS CATHETER;  Surgeon: Marea Selinda GORMAN, MD;  Location: [facility] INVASIVE CV LAB;  Service: Cardiovascular;  Laterality: N/A;   TUNNELLED CATHETER EXCHANGE N/A 10/11/2024   Procedure: TUNNELLED CATHETER EXCHANGE;  Surgeon: Serene Gaile ORN, MD;  Location: [facility];  Service: Cardiovascular;  Laterality: N/A;   Family History  Family History  Problem Relation Age of Onset   Pneumonia Mother    Breast cancer Mother    Diabetes Father    Heart disease Father    Breast cancer Sister    Prostate cancer Other    Stomach cancer  Other    Colon cancer Neg Hx    Esophageal cancer Neg Hx    Rectal cancer Neg Hx    Social History  reports that he has never smoked. He has never been exposed to tobacco smoke. He has never used smokeless tobacco. He reports that he does not currently use alcohol. He reports that he does not use drugs. Allergies Allergies[1] Home medications Prior to Admission medications  Medication Sig Start Date End Date Taking? Authorizing Provider  Calcium  Acetate 667 MG TABS Take 1,334 mg by mouth 3 (three) times daily with meals.   Yes [provider]  insulin  glargine (LANTUS ) 100 UNIT/ML injection Inject 25 Units into the skin 2 (two) times daily.   Yes [provider]  oxyCODONE -acetaminophen  (PERCOCET/ROXICET) 5-325 MG tablet Take 1 tablet by mouth every 8 (eight) hours as needed. 12/23/24  Yes Harden Jerona GAILS, MD  sertraline  (ZOLOFT ) 100 MG tablet TAKE 1 TABLET BY MOUTH EVERY DAY 11/30/24  Yes Chandra Toribio POUR, MD  amLODipine  (NORVASC ) 10 MG tablet Take 1 tablet (10 mg total) by mouth daily. Patient not taking: Reported on 06/11/2024 04/08/24   Dezii, Alexandra, DO  Blood Glucose Monitoring Suppl DEVI Check glucose three times daily May substitute to any manufacturer covered by patient's insurance. 05/23/24   Chandra Toribio POUR, MD  Glucose Blood (BLOOD GLUCOSE TEST STRIPS) STRP Check glucose three times daily May substitute to any manufacturer covered by patient's insurance. 05/23/24   Chandra Toribio POUR, MD  Influenza Vac Subunit Quad (FLUCELVAX QUADRIVALENT IM) Number of Repeats Allowed: Frequency: One time only 09/23/24 03/05/25  [provider]  insulin  aspart (NOVOLOG  FLEXPEN) 100 UNIT/ML FlexPen Inject 0-25 Units into the skin 3 (three) times daily as needed (blood sugar > 200). Patient not taking: Reported on 12/26/2024 05/23/24   Chandra Toribio POUR, MD  Lancet Device MISC Check blood glucose three times daily May substitute to any manufacturer covered by patient's insurance. 05/23/24    Chandra Toribio POUR, MD  Lancets [facility]) lancets Use as instructed Patient not taking: No sig reported 06/11/19   Tysinger, Alm GORMAN, PA-C  Lancets Misc. MISC Check glucose three times daily May substitute to any manufacturer covered by patient's insurance. Patient not taking: No sig reported 05/23/24   Chandra Toribio POUR, MD  mupirocin  cream (BACTROBAN ) 2 % Apply 1 Application topically 2 (two) times daily. Patient not taking: Reported on 06/11/2024 05/23/24   Chandra Toribio POUR, MD  tadalafil (CIALIS) 20 MG tablet Take 20 mg by mouth daily as needed. Patient not taking: Reported on 12/26/2024 09/27/24   [provider]  Vitals:   12/27/24 1215 12/27/24 1230 12/27/24 1300 12/27/24 1315  BP: (!) 178/114 (!) 170/96 (!) 164/130 (!) 164/130  Pulse: 76 74 77 81  Resp: 12 (!) 9 17 17   Temp:      TempSrc:      SpO2: 97% 97% 97% 98%  Weight:      Height:       Exam Gen alert, no distress Sclera anicteric, throat clear  No jvd or bruits Chest clear bilat to bases RRR no MRG Abd soft ntnd no mass or ascites +bs Ext no LE or UE edema, no other edema Neuro is alert, Ox 3 , nf    LIJ TDC intact  Home bp meds: Norvasc     OP HD: [facility]TS  From [facility] Jan 2025 ->  4h  2k  95kg  B350  TDC     Assessment/ Plan:  # ESRD - on HD TTS, last HD was tuesday - plan for HD later tonight, or at the latest tomorrow morning    # HTN - high bp's here   # Volume - not grossly overloaded on exam - pt seems to state they have to pull a lot of fluid on him - UF 3-4 L as tolerated   # 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, no esa needs    # s/p 1st toe ampututation - on 1/23 by Dr Harden Myer Fret  MD [name] 12/27/2024, 1:50 PM  Recent Labs  Lab 12/27/24 0654  HGB 13.3  CREATININE 17.00*  K 5.4*   Inpatient medications:  acetaminophen   500 mg Oral Q6H   amLODipine   10 mg Oral Daily   calcium  acetate  1,334 mg Oral TID WC   docusate sodium   100 mg Oral BID   insulin   aspart  0-9 Units Subcutaneous TID WC   [START ON 12/28/2024] sertraline   100 mg Oral Daily    [START ON 12/28/2024] acetaminophen , diphenhydrAMINE , HYDROcodone -acetaminophen , HYDROcodone -acetaminophen , HYDROmorphone  (DILAUDID ) injection, HYDROmorphone  (DILAUDID ) injection, ondansetron  (ZOFRAN ) IV, ondansetron  **OR** ondansetron  (ZOFRAN ) IV, oxyCODONE  **OR** oxyCODONE       [1]  Allergies Allergen Reactions   Apresoline  [Hydralazine ] Other (See Comments)    Chest pain if by mouth- can tolerate it via IV   Imdur  [Isosorbide  Nitrate] Other (See Comments)    Headaches   Ms Contin  [Morphine ] Itching

## 2024-12-27 NOTE — Progress Notes (Signed)
 Orthopedic Tech Progress Note Patient Details:  FABYAN LOUGHMILLER [birth year] [id number]  Ortho Devices Type of Ortho Device: Postop shoe/boot Ortho Device/Splint Location: RLE/ left with PACU nurse Ortho Device/Splint Interventions: Ordered   Post Interventions Patient Tolerated: Well Instructions Provided: Care of device, Adjustment of device  Adine MARLA Blush 12/27/2024, 11:52 AM

## 2024-12-27 NOTE — Transfer of Care (Signed)
 Immediate Anesthesia Transfer of Care Note  Patient: Brandon [name]  Procedure(s) Performed: RIGHT FIFTH RAY AMPUTATION (Right)  Patient Location: PACU  Anesthesia Type:MAC and Regional  Level of Consciousness: drowsy, patient cooperative, and responds to stimulation  Airway & Oxygen Therapy: Patient Spontanous Breathing and Patient connected to nasal cannula oxygen  Post-op Assessment: Report given to RN and Post -op Vital signs reviewed and stable  Post vital signs: Reviewed and stable  Last Vitals:  Vitals Value Taken Time  BP 104/72 12/27/24 08:47  Temp 36.6 C 12/27/24 08:47  Pulse 63 12/27/24 08:50  Resp 8 12/27/24 08:50  SpO2 100 % 12/27/24 08:50  Vitals shown include unfiled device data.  Last Pain:  Vitals:   12/27/24 0847  TempSrc:   PainSc: Asleep      Patients Stated Pain Goal: 3 (12/27/24 9346)  Complications: No notable events documented.

## 2024-12-27 NOTE — Progress Notes (Addendum)
"    [facility]   Dr. Geralynn with Nephrology was consult for HD.   Patient is being admitted over night.   Pending staffing he may be sent to his out patient HD facility.  I spoke with DR. Schertz and he will review the patients emergent needs for HD verse OP HD at his facility.  They will help with the arrangements pending review.     Maurilio Deland Collet PA-C [phone] "

## 2024-12-27 NOTE — Anesthesia Procedure Notes (Signed)
 Anesthesia Regional Block: Popliteal block   Pre-Anesthetic Checklist: , timeout performed,  Correct Patient, Correct Site, Correct Laterality,  Correct Procedure, Correct Position, site marked,  Risks and benefits discussed,  Surgical consent,  Pre-op  evaluation,  At surgeon's request and post-op pain management  Laterality: Right  Prep: chloraprep       Needles:  Injection technique: Single-shot  Needle Type: Echogenic Stimulator Needle     Needle Length: 10cm  Needle Gauge: 21   Needle insertion depth (cm): 6   Additional Needles:   Procedures:,,,, ultrasound used (permanent image in chart),,    Narrative:  Start time: 12/27/2024 8:02 AM End time: 12/27/2024 8:07 AM Injection made incrementally with aspirations every 5 mL.  Performed by: Personally  Anesthesiologist: Jerrye Sharper, MD  Additional Notes: Timeout performed. Patient sedated. Relevant anatomy ID'd using US . Incremental 2-5ml injection of LA with frequent aspiration. Patient tolerated procedure well.

## 2024-12-27 NOTE — Op Note (Signed)
 12/27/2024  8:45 AM  PATIENT:  Brandon [name]    PRE-OPERATIVE DIAGNOSIS:  Osteomyelitis Right Little Toe  POST-OPERATIVE DIAGNOSIS:  Same  PROCEDURE:  RIGHT FIFTH RAY AMPUTATION Local tissue transfer for wound closure 10 x 4 cm.  SURGEON:  Jerona LULLA Sage, MD  PHYSICIAN ASSISTANT:None ANESTHESIA:   General  PREOPERATIVE INDICATIONS:  [name] is a  64 y.o. male with a diagnosis of Osteomyelitis Right Little Toe who failed conservative measures and elected for surgical management.    The risks benefits and alternatives were discussed with the patient preoperatively including but not limited to the risks of infection, bleeding, nerve injury, cardiopulmonary complications, the need for revision surgery, among others, and the patient was willing to proceed.  OPERATIVE IMPLANTS:   * No implants in log *  @ENCIMAGES @  OPERATIVE FINDINGS: Tissue margins had good petechial bleeding.  Tissue margins were clear.  OPERATIVE PROCEDURE: Patient was brought the operating room and underwent a MAC anesthetic.  After adequate levels anesthesia were obtained patient's right lower extremity was prepped using DuraPrep draped into a sterile field a timeout was called.  An elliptical incision was made around the ulcerative tissue and the fifth ray to resect the ray through the base of the fifth metatarsal with the ulcerative tissue and 1 block of tissue.  The tissue margins were clear.  Electrocautery was used for hemostasis.  The wound was irrigated with Vashe.  The tissue margins were undermined to allow for local tissue transfer.  Local tissue transfer was used to close the wound 10 x 4 cm.  Sterile dressing was applied patient was taken the PACU in stable condition   DISCHARGE PLANNING:  Antibiotic duration: 24 hours antibiotics  Weightbearing: Touchdown weightbearing on the right  Pain medication: Opioid pathway  Dressing care/ Wound VAC: Dry dressing reinforce as  needed  Ambulatory devices: Walker or crutches  Discharge to: Anticipate admission for observation with dialysis in the hospital and discharge to home tomorrow.  Follow-up: In the office 1 week post operative.

## 2024-12-28 ENCOUNTER — Encounter (HOSPITAL_COMMUNITY): Payer: Self-pay | Admitting: Orthopedic Surgery

## 2024-12-28 DIAGNOSIS — M86671 Other chronic osteomyelitis, right ankle and foot: Secondary | ICD-10-CM | POA: Diagnosis not present

## 2024-12-28 LAB — HEPATITIS B SURFACE ANTIBODY, QUANTITATIVE: Hep B S AB Quant (Post): 70.8 m[IU]/mL

## 2024-12-28 LAB — GLUCOSE, CAPILLARY
Glucose-Capillary: 124 mg/dL — ABNORMAL HIGH (ref 70–99)
Glucose-Capillary: 92 mg/dL (ref 70–99)

## 2024-12-28 MED ORDER — OXYCODONE-ACETAMINOPHEN 5-325 MG PO TABS: 1.0000 | ORAL_TABLET | ORAL | 0 refills | Status: AC | PRN

## 2024-12-28 MED ORDER — HEPARIN SODIUM (PORCINE) 1000 UNIT/ML IJ SOLN
INTRAMUSCULAR | Status: AC
  Filled 2024-12-28: qty 1

## 2024-12-28 NOTE — Care Management Obs Status (Signed)
 MEDICARE OBSERVATION STATUS NOTIFICATION   Patient Details  Name: Brandon [name] MRN: [id number] Date of Birth: Oct 31, 1961   Medicare Observation Status Notification Given:  Yes    Rosalva Jon Bloch, RN 12/28/2024, 9:26 AM

## 2024-12-28 NOTE — Progress Notes (Addendum)
 Patient ID: KRIS NO, male   DOB: 1961-08-06, 64 y.o.   MRN: [id number] Patient is postoperative day 1/5 ray amputation.  The dressing is clean and dry he completed dialysis last night.  Plan for discharge today discussed the importance of minimizing weightbearing.

## 2024-12-28 NOTE — Plan of Care (Signed)
" °  Problem: Education: Goal: Knowledge of the prescribed therapeutic regimen will improve Outcome: Progressing   Problem: Bowel/Gastric: Goal: Gastrointestinal status for postoperative course will improve Outcome: Progressing   Problem: Cardiac: Goal: Ability to maintain an adequate cardiac output Outcome: Progressing Goal: Will show no evidence of cardiac arrhythmias Outcome: Progressing   Problem: Nutritional: Goal: Will attain and maintain optimal nutritional status Outcome: Progressing   Problem: Neurological: Goal: Will regain or maintain usual level of consciousness Outcome: Progressing   Problem: Clinical Measurements: Goal: Ability to maintain clinical measurements within normal limits Outcome: Progressing Goal: Postoperative complications will be avoided or minimized Outcome: Progressing   Problem: Respiratory: Goal: Will regain and/or maintain adequate ventilation Outcome: Progressing Goal: Respiratory status will improve Outcome: Progressing   Problem: Skin Integrity: Goal: Demonstrates signs of wound healing without infection Outcome: Progressing   Problem: Urinary Elimination: Goal: Will remain free from infection Outcome: Progressing Goal: Ability to achieve and maintain adequate urine output Outcome: Progressing   Problem: Education: Goal: Ability to describe self-care measures that may prevent or decrease complications (Diabetes Survival Skills Education) will improve Outcome: Progressing Goal: Individualized Educational Video(s) Outcome: Progressing   Problem: Coping: Goal: Ability to adjust to condition or change in health will improve Outcome: Progressing   Problem: Fluid Volume: Goal: Ability to maintain a balanced intake and output will improve Outcome: Progressing   Problem: Health Behavior/Discharge Planning: Goal: Ability to identify and utilize available resources and services will improve Outcome: Progressing Goal: Ability to  manage health-related needs will improve Outcome: Progressing   Problem: Metabolic: Goal: Ability to maintain appropriate glucose levels will improve Outcome: Progressing   Problem: Nutritional: Goal: Maintenance of adequate nutrition will improve Outcome: Progressing Goal: Progress toward achieving an optimal weight will improve Outcome: Progressing   Problem: Skin Integrity: Goal: Risk for impaired skin integrity will decrease Outcome: Progressing   Problem: Tissue Perfusion: Goal: Adequacy of tissue perfusion will improve Outcome: Progressing   Problem: Education: Goal: Knowledge of General Education information will improve Description: Including pain rating scale, medication(s)/side effects and non-pharmacologic comfort measures Outcome: Progressing   Problem: Health Behavior/Discharge Planning: Goal: Ability to manage health-related needs will improve Outcome: Progressing   Problem: Clinical Measurements: Goal: Ability to maintain clinical measurements within normal limits will improve Outcome: Progressing Goal: Will remain free from infection Outcome: Progressing Goal: Diagnostic test results will improve Outcome: Progressing Goal: Respiratory complications will improve Outcome: Progressing Goal: Cardiovascular complication will be avoided Outcome: Progressing   Problem: Activity: Goal: Risk for activity intolerance will decrease Outcome: Progressing   Problem: Nutrition: Goal: Adequate nutrition will be maintained Outcome: Progressing   Problem: Coping: Goal: Level of anxiety will decrease Outcome: Progressing   Problem: Elimination: Goal: Will not experience complications related to bowel motility Outcome: Progressing Goal: Will not experience complications related to urinary retention Outcome: Progressing   Problem: Pain Managment: Goal: General experience of comfort will improve and/or be controlled Outcome: Progressing   Problem:  Safety: Goal: Ability to remain free from injury will improve Outcome: Progressing   Problem: Skin Integrity: Goal: Risk for impaired skin integrity will decrease Outcome: Progressing   "

## 2024-12-28 NOTE — Progress Notes (Signed)
" °   12/28/24 0031  Vitals  Temp 98.2 F (36.8 C)  Pulse Rate 76  Resp 15  BP 131/81  SpO2 100 %  O2 Device Room Air  Weight 102 kg  Type of Weight Post-Dialysis  Oxygen Therapy  Patient Activity (if Appropriate) In bed  Pulse Oximetry Type Continuous  Oximetry Probe Site Changed No  Post Treatment  Dialyzer Clearance Clear  Liters Processed 84  Fluid Removed (mL) 2000 mL  Tolerated HD Treatment Yes  Post-Hemodialysis Comments Patient BP decreased during run. UF goal decreased. Patient tolerated HD well.   Dressing Changed per protocol. Patient tolerated well. "

## 2024-12-28 NOTE — Discharge Summary (Signed)
 " Physician Discharge Summary  Patient ID: Brandon [name] MRN: [id number] DOB/AGE: [birth year] 64 y.o.  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 ischemic stroke   GERD (gastroesophageal reflux disease)    Hiatal hernia    Hyperlipidemia    Hypertension    Seizures (HCC)    none since 2016, taking Keppra  - maw   Sleep apnea    does not use CPAP   Thyroid  disease    weak thyroid  per MD   Wears glasses     Surgeries: Procedures: RIGHT FIFTH RAY AMPUTATION on 12/27/2024   Consultants: Treatment Team:  Geralynn Lamar, MD  Discharged Condition: Improved  Hospital Course: Brandon [name] is an 64 y.o. male who was admitted 12/27/2024 with a chief complaint of No chief complaint on file. , and found to have a diagnosis of Infected wound.  They were brought to the operating room on 12/27/2024 and underwent the above named procedures.    They were given perioperative antibiotics:  Anti-infectives (From admission, onward)    Start     Dose/Rate Route Frequency  Ordered Stop   12/27/24 1400  ceFAZolin  (ANCEF ) IVPB 1 g/50 mL premix  Status:  Discontinued        1 g 100 mL/hr over 30 Minutes Intravenous Every 8 hours 12/27/24 1112 12/27/24 1329   12/27/24 0645  ceFAZolin  (ANCEF ) IVPB 2g/100 mL premix        2 g 200 mL/hr over 30 Minutes Intravenous On call to O.R. 12/27/24 [id number] 12/27/24 0816     .  They were given compression stockings, early ambulation, and chemoprophylaxis for DVT prophylaxis.  They benefited maximally from their hospital stay and there were no complications.    Recent vital signs:  Vitals:   12/28/24 0355 12/28/24 0742  BP: (!) 113/57 (!) 118/58  Pulse: 80 72  Resp: 18   Temp: 98.3 F (36.8 C) 98.4 F (36.9 C)  SpO2: 97% 99%    Recent laboratory studies:  Results for orders placed or performed during the hospital encounter of 12/27/24  Glucose, capillary   Collection Time: 12/27/24  6:50 AM  Result Value Ref Range   Glucose-Capillary 77 70 - 99 mg/dL   Comment 1 Notify RN   I-STAT, chem 8   Collection Time: 12/27/24  6:54 AM  Result Value Ref Range   Sodium 138 135 - 145  mmol/L   Potassium 5.4 (H) 3.5 - 5.1 mmol/L   Chloride 97 (L) 98 - 111 mmol/L   BUN 74 (H) 8 - 23 mg/dL   Creatinine, Ser 82.99 (H) 0.61 - 1.24 mg/dL   Glucose, Bld 75 70 - 99 mg/dL   Calcium , Ion 0.82 (LL) 1.15 - 1.40 mmol/L   TCO2 27 22 - 32 mmol/L   Hemoglobin 13.3 13.0 - 17.0 g/dL   HCT 60.9 60.9 - 47.9 %   Comment NOTIFIED PHYSICIAN   Glucose, capillary   Collection Time: 12/27/24  1:00 PM  Result Value Ref Range   Glucose-Capillary 54 (L) 70 - 99 mg/dL  Glucose, capillary   Collection Time: 12/27/24  1:51 PM  Result Value Ref Range   Glucose-Capillary 52 (L) 70 - 99 mg/dL  Glucose, capillary   Collection Time: 12/27/24  2:17 PM  Result Value Ref Range   Glucose-Capillary 69 (L) 70 - 99 mg/dL  Hepatitis B surface antigen   Collection Time: 12/27/24  5:10 PM  Result Value Ref Range   Hepatitis B Surface Ag NON REACTIVE NON  REACTIVE  Hepatitis B surface antibody,quantitative   Collection Time: 12/27/24  5:10 PM  Result Value Ref Range   Hep B S AB Quant (Post) 70.8 Immunity>10 mIU/mL  Glucose, capillary   Collection Time: 12/27/24  5:15 PM  Result Value Ref Range   Glucose-Capillary 74 70 - 99 mg/dL  Glucose, capillary   Collection Time: 12/28/24  6:10 AM  Result Value Ref Range   Glucose-Capillary 124 (H) 70 - 99 mg/dL  Glucose, capillary   Collection Time: 12/28/24 12:42 PM  Result Value Ref Range   Glucose-Capillary 92 70 - 99 mg/dL    Discharge Medications:   Allergies as of 12/28/2024       Reactions   Apresoline  [hydralazine ] Other (See Comments)   Chest pain if by mouth- can tolerate it via IV   Imdur  [isosorbide  Nitrate] Other (See Comments)   Headaches   Ms Contin  [morphine ] Itching        Medication List     TAKE these medications    accu-chek soft touch lancets Use as instructed   amLODipine  10 MG tablet Commonly known as: NORVASC  Take 1 tablet (10 mg total) by mouth daily.   Blood Glucose Monitoring Suppl Devi Check glucose three times daily May substitute to any manufacturer covered by patient's insurance.   BLOOD GLUCOSE TEST STRIPS Strp Check glucose three times daily May substitute to any manufacturer covered by patient's insurance.   Calcium  Acetate 667 MG Tabs Take 1,334 mg by mouth 3 (three) times daily with meals.   FLUCELVAX QUADRIVALENT IM Number of Repeats Allowed: Frequency: One time only   insulin  glargine 100 UNIT/ML injection Commonly known as: LANTUS  Inject 25 Units into the skin 2 (two) times daily.   Lancet Device Misc Check blood glucose three times daily May substitute to any manufacturer covered by patient's insurance.   Lancets Misc. Misc Check glucose three times daily May substitute to any manufacturer covered by patient's insurance.   mupirocin  cream 2 % Commonly known as: BACTROBAN  Apply 1 Application topically 2 (two) times  daily.   NovoLOG  FlexPen 100 UNIT/ML FlexPen Generic drug: insulin  aspart Inject 0-25 Units into the skin 3 (three) times daily as needed (blood sugar > 200).   oxyCODONE -acetaminophen  5-325 MG tablet Commonly known as: PERCOCET/ROXICET Take 1 tablet by mouth every 4 (four) hours as needed. What changed: when to take this   sertraline   100 MG tablet Commonly known as: ZOLOFT  TAKE 1 TABLET BY MOUTH EVERY DAY   tadalafil 20 MG tablet Commonly known as: CIALIS Take 20 mg by mouth daily as needed.               Durable Medical Equipment  (From admission, onward)           Start     Ordered   12/28/24 0000  For home use only DME Crutches        12/28/24 1313              Discharge Care Instructions  (From admission, onward)           Start     Ordered   12/28/24 0000  Touch down weight bearing       Question Answer Comment  Laterality right   Extremity Lower      12/28/24 1313            Diagnostic Studies: VAS US  ABI WITH/WO TBI Result Date: 12/13/2024  LOWER EXTREMITY DOPPLER STUDY Patient Name:  MARLYN RABINE  Date of Exam:   12/13/2024 Medical Rec #: [id number]          Accession #:    [id number] Date of Birth: Apr 13, 1961           Patient Gender: M Patient Age:   21 years Exam Location:  [address] Procedure:      VAS US  ABI WITH/WO TBI Referring Phys: Melinda Pottinger --------------------------------------------------------------------------------  Indications: Osteomyelitis of right foot High Risk Factors: Hypertension, hyperlipidemia, Diabetes, no history of                    smoking, prior CVA.  Comparison Study: 04/22/16                   right: 1.24                   left: 1.22 Performing Technologist: Dena Pane  Examination Guidelines: A complete evaluation includes at minimum, Doppler waveform signals and systolic blood pressure reading at the level of bilateral brachial, anterior tibial, and posterior tibial arteries, when vessel segments  are accessible. Bilateral testing is considered an integral part of a complete examination. Photoelectric Plethysmograph (PPG) waveforms and toe systolic pressure readings are included as required and additional duplex testing as needed. Limited examinations for reoccurring indications may be performed as noted.  ABI Findings: +---------+------------------+-----+----------+--------+ Right    Rt Pressure (mmHg)IndexWaveform  Comment  +---------+------------------+-----+----------+--------+ Brachial 153                    triphasic          +---------+------------------+-----+----------+--------+ PTA      148               0.85 triphasic          +---------+------------------+-----+----------+--------+ DP       150               0.86 monophasic         +---------+------------------+-----+----------+--------+ Great Toe162               0.93                    +---------+------------------+-----+----------+--------+ +---------+------------------+-----+----------+-------+ Left     Lt Pressure (mmHg)IndexWaveform  Comment +---------+------------------+-----+----------+-------+ Brachial 174  triphasic         +---------+------------------+-----+----------+-------+ PTA      160               0.92 biphasic          +---------+------------------+-----+----------+-------+ DP       157               0.90 monophasic        +---------+------------------+-----+----------+-------+ Great Toe144               0.83                   +---------+------------------+-----+----------+-------+ +-------+-----------+-----------+------------+------------+ ABI/TBIToday's ABIToday's TBIPrevious ABIPrevious TBI +-------+-----------+-----------+------------+------------+ Right  0.86       0.93       1.24        1.13         +-------+-----------+-----------+------------+------------+ Left   0.92       0.83       1.22        1.03          +-------+-----------+-----------+------------+------------+  Bilateral ABIs appear decreased. Bilateral TBIs appear essentially unchanged.  Summary: Right: Resting right ankle-brachial index indicates mild right lower extremity arterial disease. The right toe-brachial index is normal.  Left: Resting left ankle-brachial index indicates mild left lower extremity arterial disease. The left toe-brachial index is normal.  *See table(s) above for measurements and observations.  Electronically signed by Norman Serve on 12/13/2024 at 3:33:07 PM.    Final    XR Foot Complete Right Result Date: 12/04/2024 Three-view radiographs of the right foot shows osteoarthritis with osteophytic bone spurs of the great toe MTP joint.  There is significant arterial calcification with calcification of the arteries out to the toes.  There were lytic changes to the fifth metatarsal head.   Disposition: Discharge disposition: 01-Home or Self Care       Discharge Instructions     Call MD / Call 911   Complete by: As directed    If you experience chest pain or shortness of breath, CALL 911 and be transported to the hospital emergency room.  If you develope a fever above 101 F, pus (white drainage) or increased drainage or redness at the wound, or calf pain, call your surgeon's office.   Constipation Prevention   Complete by: As directed    Drink plenty of fluids.  Prune juice may be helpful.  You may use a stool softener, such as Colace (over the counter) 100 mg twice a day.  Use MiraLax  (over the counter) for constipation as needed.   For home use only DME Crutches   Complete by: As directed    Increase activity slowly as tolerated   Complete by: As directed    Post-operative opioid taper instructions:   Complete by: As directed    POST-OPERATIVE OPIOID TAPER INSTRUCTIONS: It is important to wean off of your opioid medication as soon as possible. If you do not need pain medication after your surgery it is ok to stop  day one. Opioids include: Codeine , Hydrocodone (Norco, Vicodin), Oxycodone (Percocet, oxycontin ) and hydromorphone  amongst others.  Long term and even short term use of opiods can cause: Increased pain response Dependence Constipation Depression Respiratory depression And more.  Withdrawal symptoms can include Flu like symptoms Nausea, vomiting And more Techniques to manage these symptoms Hydrate well Eat regular healthy meals Stay active Use relaxation techniques(deep breathing, meditating, yoga) Do Not substitute Alcohol to help with tapering If you have  been on opioids for less than two weeks and do not have pain than it is ok to stop all together.  Plan to wean off of opioids This plan should start within one week post op of your joint replacement. Maintain the same interval or time between taking each dose and first decrease the dose.  Cut the total daily intake of opioids by one tablet each day Next start to increase the time between doses. The last dose that should be eliminated is the evening dose.      Touch down weight bearing   Complete by: As directed    Laterality: right   Extremity: Lower        Follow-up Information     Harden Jerona GAILS, MD Follow up in 1 week(s).   Specialty: Orthopedic Surgery Contact information: [address] [place]  [place] [address] [phone]         Dialysis, [facility]. Go on 12/28/2024.   Why: please arrive at 10:45 am for 11:00 am chair time. Contact information: [address] [place] [address] [phone]         Chandra Toribio POUR, MD Follow up.   Specialty: Family Medicine Contact information: [address] [place] [address] [phone]                  Signed: Jerona GAILS Harden 12/28/2024, 1:13 PM  "

## 2024-12-28 NOTE — Progress Notes (Signed)
 PT Cancellation Note  Patient Details Name: OVA MEEGAN MRN: [id number] DOB: [birth year]   Cancelled Treatment:    Reason Eval/Treat Not Completed: Patient declined, no reason specified Pt reports he is in too much pain to move; I offered to notify RN and he states, I'll do it myself. He reports he wants crutches, but would not let PT assess him.   Aleck Daring, PT, DPT Acute Rehabilitation Services Office [phone]    Aleck ONEIDA Daring 12/28/2024, 11:13 AM

## 2024-12-28 NOTE — Progress Notes (Signed)
 Brandon [name] Progress Note  Subjective:  Pt seen in room Had HD overnight early this am No c/o's today Bp's dropped to only 2 L removed  Tolerated HD well  Presentation summary:  64 y.o. year-old w/ PMH as below who was admitted for R little toe ray amputation which was done today by Dr Harden. Pt has esrd on HD.  We are asked to see for dialysis. Pt seen in PACU. Pt is pleasant, just had toe amp surgery. Denies any SOB, CP recently. Tries to make all his HD sessions. Last HD was on Tuesday (TTS in [place]). No leg swelling. [name] is 3-6 kg at his OP unit.     Vitals:   12/28/24 0031 12/28/24 0117 12/28/24 0355 12/28/24 0742  BP: 131/81 139/82 (!) 113/57 (!) 118/58  Pulse: 76 77 80 72  Resp: 15 18 18    Temp: 98.2 F (36.8 C) 98.4 F (36.9 C) 98.3 F (36.8 C) 98.4 F (36.9 C)  TempSrc:  Oral Oral Oral  SpO2: 100% 100% 97% 99%  Weight: 102 kg     Height:        Exam: Gen alert, no distress, pleasant  Sclera anicteric, throat clear  No jvd or bruits Chest clear bilat to bases RRR no MRG Abd soft ntnd no mass or ascites +bs Ext no LE or UE edema, no other edema Neuro is alert, Ox 3 , nf    LIJ TDC intact   Home bp meds: Norvasc       OP HD: [facility]  From [facility] Jan 2025 ->  4h  2k  95kg  B350  TDC       Assessment/ Plan:   # ESRD - on HD TTS, last HD was tuesday - pt got HD overnight early this am - no need for further HD this weekend  - next HD on 1/27   # HTN - high bp's came down w/ HD and probably pain control   # Volume - not grossly overloaded on exam - UF 2 L w/ HD last night   # 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, no esa needs   # s/p 1st toe ampututation - on 1/23 by Dr Harden - per orthopedics  Brandon Fret MD  [name] 12/28/2024, 9:31 AM  Recent Labs  Lab 12/27/24 0654  HGB 13.3  CREATININE 17.00*  K 5.4*   No results for input(s): IRON, TIBC, FERRITIN in the last 168 hours. Inpatient medications:  acetaminophen   500  mg Oral Q6H   amLODipine   10 mg Oral Daily   calcium  acetate  1,334 mg Oral TID WC   Chlorhexidine  Gluconate Cloth  6 each Topical Q0600   docusate sodium   100 mg Oral BID   insulin  aspart  0-9 Units Subcutaneous TID WC   sertraline   100 mg Oral Daily    acetaminophen , diphenhydrAMINE , HYDROcodone -acetaminophen , HYDROcodone -acetaminophen , HYDROmorphone  (DILAUDID ) injection, ondansetron  **OR** ondansetron  (ZOFRAN ) IV

## 2024-12-30 ENCOUNTER — Ambulatory Visit: Admitting: Family Medicine

## 2024-12-30 NOTE — Progress Notes (Signed)
 Late Note Entry- Dec 30, 2024  Pt was d/c on Saturday. Contacted [facility] this morning to be advised of pt's d/c date and that pt received treatment at hospital late Friday night into Saturday morning. D/C summary and last renal note faxed to clinic for continuation of care.   Randine Mungo Dialysis Navigator [phone]

## 2025-01-03 ENCOUNTER — Inpatient Hospital Stay (HOSPITAL_COMMUNITY)
Admission: EM | Admit: 2025-01-03 | Discharge: 2025-01-06 | DRG: 640 | Disposition: A | Attending: Internal Medicine | Admitting: Student

## 2025-01-03 ENCOUNTER — Emergency Department (HOSPITAL_COMMUNITY)

## 2025-01-03 ENCOUNTER — Ambulatory Visit: Admitting: Physician Assistant

## 2025-01-03 DIAGNOSIS — N529 Male erectile dysfunction, unspecified: Secondary | ICD-10-CM | POA: Diagnosis present

## 2025-01-03 DIAGNOSIS — Z8042 Family history of malignant neoplasm of prostate: Secondary | ICD-10-CM

## 2025-01-03 DIAGNOSIS — N179 Acute kidney failure, unspecified: Secondary | ICD-10-CM | POA: Diagnosis present

## 2025-01-03 DIAGNOSIS — Z803 Family history of malignant neoplasm of breast: Secondary | ICD-10-CM

## 2025-01-03 DIAGNOSIS — E1122 Type 2 diabetes mellitus with diabetic chronic kidney disease: Secondary | ICD-10-CM | POA: Diagnosis present

## 2025-01-03 DIAGNOSIS — Z992 Dependence on renal dialysis: Secondary | ICD-10-CM | POA: Diagnosis not present

## 2025-01-03 DIAGNOSIS — E1159 Type 2 diabetes mellitus with other circulatory complications: Secondary | ICD-10-CM | POA: Diagnosis present

## 2025-01-03 DIAGNOSIS — F840 Autistic disorder: Secondary | ICD-10-CM | POA: Diagnosis present

## 2025-01-03 DIAGNOSIS — Z23 Encounter for immunization: Secondary | ICD-10-CM

## 2025-01-03 DIAGNOSIS — Z888 Allergy status to other drugs, medicaments and biological substances status: Secondary | ICD-10-CM

## 2025-01-03 DIAGNOSIS — Z89421 Acquired absence of other right toe(s): Secondary | ICD-10-CM

## 2025-01-03 DIAGNOSIS — Z91A58 Caregiver's noncompliance with patient's renal dialysis for other reason: Secondary | ICD-10-CM

## 2025-01-03 DIAGNOSIS — Z85528 Personal history of other malignant neoplasm of kidney: Secondary | ICD-10-CM | POA: Diagnosis not present

## 2025-01-03 DIAGNOSIS — Z8249 Family history of ischemic heart disease and other diseases of the circulatory system: Secondary | ICD-10-CM | POA: Diagnosis not present

## 2025-01-03 DIAGNOSIS — E875 Hyperkalemia: Secondary | ICD-10-CM | POA: Diagnosis present

## 2025-01-03 DIAGNOSIS — I152 Hypertension secondary to endocrine disorders: Secondary | ICD-10-CM | POA: Diagnosis present

## 2025-01-03 DIAGNOSIS — F319 Bipolar disorder, unspecified: Secondary | ICD-10-CM | POA: Diagnosis present

## 2025-01-03 DIAGNOSIS — R531 Weakness: Secondary | ICD-10-CM | POA: Diagnosis not present

## 2025-01-03 DIAGNOSIS — G4733 Obstructive sleep apnea (adult) (pediatric): Secondary | ICD-10-CM | POA: Diagnosis present

## 2025-01-03 DIAGNOSIS — Z91158 Patient's noncompliance with renal dialysis for other reason: Secondary | ICD-10-CM | POA: Diagnosis not present

## 2025-01-03 DIAGNOSIS — E878 Other disorders of electrolyte and fluid balance, not elsewhere classified: Secondary | ICD-10-CM | POA: Diagnosis not present

## 2025-01-03 DIAGNOSIS — I120 Hypertensive chronic kidney disease with stage 5 chronic kidney disease or end stage renal disease: Secondary | ICD-10-CM | POA: Diagnosis present

## 2025-01-03 DIAGNOSIS — D631 Anemia in chronic kidney disease: Secondary | ICD-10-CM | POA: Diagnosis present

## 2025-01-03 DIAGNOSIS — K219 Gastro-esophageal reflux disease without esophagitis: Secondary | ICD-10-CM | POA: Diagnosis present

## 2025-01-03 DIAGNOSIS — Z79899 Other long term (current) drug therapy: Secondary | ICD-10-CM

## 2025-01-03 DIAGNOSIS — Z833 Family history of diabetes mellitus: Secondary | ICD-10-CM

## 2025-01-03 DIAGNOSIS — E11649 Type 2 diabetes mellitus with hypoglycemia without coma: Secondary | ICD-10-CM | POA: Diagnosis present

## 2025-01-03 DIAGNOSIS — I6930 Unspecified sequelae of cerebral infarction: Secondary | ICD-10-CM

## 2025-01-03 DIAGNOSIS — G928 Other toxic encephalopathy: Secondary | ICD-10-CM | POA: Diagnosis not present

## 2025-01-03 DIAGNOSIS — E785 Hyperlipidemia, unspecified: Secondary | ICD-10-CM | POA: Diagnosis present

## 2025-01-03 DIAGNOSIS — Z794 Long term (current) use of insulin: Secondary | ICD-10-CM | POA: Diagnosis not present

## 2025-01-03 DIAGNOSIS — Z885 Allergy status to narcotic agent status: Secondary | ICD-10-CM | POA: Diagnosis not present

## 2025-01-03 DIAGNOSIS — N184 Chronic kidney disease, stage 4 (severe): Secondary | ICD-10-CM | POA: Diagnosis present

## 2025-01-03 DIAGNOSIS — I69354 Hemiplegia and hemiparesis following cerebral infarction affecting left non-dominant side: Secondary | ICD-10-CM | POA: Diagnosis not present

## 2025-01-03 DIAGNOSIS — E119 Type 2 diabetes mellitus without complications: Secondary | ICD-10-CM

## 2025-01-03 DIAGNOSIS — G459 Transient cerebral ischemic attack, unspecified: Secondary | ICD-10-CM

## 2025-01-03 DIAGNOSIS — N186 End stage renal disease: Secondary | ICD-10-CM | POA: Diagnosis present

## 2025-01-03 DIAGNOSIS — F419 Anxiety disorder, unspecified: Secondary | ICD-10-CM | POA: Diagnosis present

## 2025-01-03 DIAGNOSIS — N250 Renal osteodystrophy: Secondary | ICD-10-CM | POA: Diagnosis present

## 2025-01-03 DIAGNOSIS — Z8673 Personal history of transient ischemic attack (TIA), and cerebral infarction without residual deficits: Secondary | ICD-10-CM

## 2025-01-03 DIAGNOSIS — R200 Anesthesia of skin: Secondary | ICD-10-CM | POA: Diagnosis present

## 2025-01-03 DIAGNOSIS — Z800 Family history of malignant neoplasm of digestive organs: Secondary | ICD-10-CM

## 2025-01-03 DIAGNOSIS — S98131A Complete traumatic amputation of one right lesser toe, initial encounter: Secondary | ICD-10-CM

## 2025-01-03 LAB — COMPREHENSIVE METABOLIC PANEL WITH GFR
ALT: <5 U/L (ref 0–44)
AST: 18 U/L (ref 15–41)
Albumin: 4.2 g/dL (ref 3.5–5.0)
Alkaline Phosphatase: 127 U/L — ABNORMAL HIGH (ref 38–126)
Anion gap: 23 — ABNORMAL HIGH (ref 5–15)
BUN: 88 mg/dL — ABNORMAL HIGH (ref 8–23)
CO2: 19 mmol/L — ABNORMAL LOW (ref 22–32)
Calcium: 7.7 mg/dL — ABNORMAL LOW (ref 8.9–10.3)
Chloride: 95 mmol/L — ABNORMAL LOW (ref 98–111)
Creatinine, Ser: 18.4 mg/dL — ABNORMAL HIGH (ref 0.61–1.24)
GFR, Estimated: 3 mL/min — ABNORMAL LOW
Glucose, Bld: 75 mg/dL (ref 70–99)
Potassium: >7.5 mmol/L (ref 3.5–5.1)
Sodium: 138 mmol/L (ref 135–145)
Total Bilirubin: 0.5 mg/dL (ref 0.0–1.2)
Total Protein: 9 g/dL — ABNORMAL HIGH (ref 6.5–8.1)

## 2025-01-03 LAB — HEPATITIS B SURFACE ANTIGEN: Hepatitis B Surface Ag: NONREACTIVE

## 2025-01-03 LAB — DIFFERENTIAL
Abs Immature Granulocytes: 0.02 10*3/uL (ref 0.00–0.07)
Basophils Absolute: 0 10*3/uL (ref 0.0–0.1)
Basophils Relative: 1 %
Eosinophils Absolute: 0.1 10*3/uL (ref 0.0–0.5)
Eosinophils Relative: 2 %
Immature Granulocytes: 0 %
Lymphocytes Relative: 24 %
Lymphs Abs: 1.6 10*3/uL (ref 0.7–4.0)
Monocytes Absolute: 0.5 10*3/uL (ref 0.1–1.0)
Monocytes Relative: 8 %
Neutro Abs: 4.3 10*3/uL (ref 1.7–7.7)
Neutrophils Relative %: 65 %

## 2025-01-03 LAB — ETHANOL: Alcohol, Ethyl (B): <15 mg/dL

## 2025-01-03 LAB — CBC
HCT: 37.7 % — ABNORMAL LOW (ref 39.0–52.0)
Hemoglobin: 12.1 g/dL — ABNORMAL LOW (ref 13.0–17.0)
MCH: 31.1 pg (ref 26.0–34.0)
MCHC: 32.1 g/dL (ref 30.0–36.0)
MCV: 96.9 fL (ref 80.0–100.0)
Platelets: 244 10*3/uL (ref 150–400)
RBC: 3.89 MIL/uL — ABNORMAL LOW (ref 4.22–5.81)
RDW: 13.2 % (ref 11.5–15.5)
WBC: 6.6 10*3/uL (ref 4.0–10.5)
nRBC: 0 % (ref 0.0–0.2)

## 2025-01-03 LAB — CBG MONITORING, ED
Glucose-Capillary: 90 mg/dL (ref 70–99)
Glucose-Capillary: 88 mg/dL (ref 70–99)
Glucose-Capillary: 34 mg/dL — CL (ref 70–99)
Glucose-Capillary: 99 mg/dL (ref 70–99)

## 2025-01-03 LAB — I-STAT CHEM 8, ED
BUN: 90 mg/dL — ABNORMAL HIGH (ref 8–23)
Calcium, Ion: 0.81 mmol/L — CL (ref 1.15–1.40)
Chloride: 105 mmol/L (ref 98–111)
Creatinine, Ser: >18 mg/dL — ABNORMAL HIGH (ref 0.61–1.24)
Glucose, Bld: 77 mg/dL (ref 70–99)
HCT: 38 % — ABNORMAL LOW (ref 39.0–52.0)
Hemoglobin: 12.9 g/dL — ABNORMAL LOW (ref 13.0–17.0)
Potassium: 8 mmol/L (ref 3.5–5.1)
Sodium: 137 mmol/L (ref 135–145)
TCO2: 24 mmol/L (ref 22–32)

## 2025-01-03 LAB — PROTIME-INR
INR: 1.3 — ABNORMAL HIGH (ref 0.8–1.2)
Prothrombin Time: 17.2 s — ABNORMAL HIGH (ref 11.4–15.2)

## 2025-01-03 LAB — APTT: aPTT: 40 s — ABNORMAL HIGH (ref 24–36)

## 2025-01-03 MED ORDER — CHLORHEXIDINE GLUCONATE CLOTH 2 % EX PADS
6.0000 | MEDICATED_PAD | Freq: Every day | CUTANEOUS | Status: DC
  Administered 2025-01-05: 6 via TOPICAL

## 2025-01-03 MED ORDER — HEPARIN SODIUM (PORCINE) 5000 UNIT/ML IJ SOLN
5000.0000 [IU] | Freq: Three times a day (TID) | INTRAMUSCULAR | Status: DC
  Administered 2025-01-04 – 2025-01-06 (×6): 5000 [IU] via SUBCUTANEOUS
  Filled 2025-01-03 (×6): qty 1

## 2025-01-03 MED ORDER — SODIUM ZIRCONIUM CYCLOSILICATE 10 G PO PACK
10.0000 g | PACK | Freq: Once | ORAL | Status: DC
  Filled 2025-01-03: qty 1

## 2025-01-03 MED ORDER — ACETAMINOPHEN 325 MG PO TABS
650.0000 mg | ORAL_TABLET | Freq: Four times a day (QID) | ORAL | Status: DC | PRN
  Administered 2025-01-04: 650 mg via ORAL
  Filled 2025-01-03 (×2): qty 2

## 2025-01-03 MED ORDER — DEXTROSE 50 % IV SOLN
1.0000 | Freq: Once | INTRAVENOUS | Status: AC
  Administered 2025-01-03: 50 mL via INTRAVENOUS

## 2025-01-03 MED ORDER — ACETAMINOPHEN 650 MG RE SUPP: 650.0000 mg | Freq: Four times a day (QID) | RECTAL | Status: DC | PRN

## 2025-01-03 MED ORDER — CALCIUM GLUCONATE 10 % IV SOLN
1.0000 g | Freq: Once | INTRAVENOUS | Status: AC
  Administered 2025-01-03: 1 g via INTRAVENOUS
  Filled 2025-01-03: qty 10

## 2025-01-03 MED ORDER — DEXTROSE 50 % IV SOLN
1.0000 | Freq: Once | INTRAVENOUS | Status: AC
  Administered 2025-01-03: 50 mL via INTRAVENOUS
  Filled 2025-01-03: qty 50

## 2025-01-03 MED ORDER — INSULIN ASPART 100 UNIT/ML IV SOLN
5.0000 [IU] | Freq: Once | INTRAVENOUS | Status: AC
  Administered 2025-01-03: 5 [IU] via INTRAVENOUS
  Filled 2025-01-03: qty 5

## 2025-01-03 MED ORDER — SODIUM CHLORIDE 0.9% FLUSH
3.0000 mL | Freq: Once | INTRAVENOUS | Status: AC
  Administered 2025-01-03: 3 mL via INTRAVENOUS

## 2025-01-03 MED ORDER — IOHEXOL 350 MG/ML SOLN
75.0000 mL | Freq: Once | INTRAVENOUS | Status: AC | PRN
  Administered 2025-01-03: 75 mL via INTRAVENOUS

## 2025-01-03 MED ORDER — ALBUTEROL SULFATE (2.5 MG/3ML) 0.083% IN NEBU
10.0000 mg | INHALATION_SOLUTION | Freq: Once | RESPIRATORY_TRACT | Status: AC
  Administered 2025-01-03: 10 mg via RESPIRATORY_TRACT
  Filled 2025-01-03: qty 12

## 2025-01-03 MED ORDER — DEXTROSE 50 % IV SOLN
25.0000 mL | Freq: Once | INTRAVENOUS | Status: AC
  Administered 2025-01-03: 25 mL via INTRAVENOUS

## 2025-01-03 NOTE — Code Documentation (Signed)
 Responded to Code Stroke called on pt who arrived to the ED at 1846. Code Stroke paged out at 1854 for R sided weakness/numbness, [id number]. CBG-90, NIH-6, CT head negative for acute changes, CTA/CTP-no LVO. TNK not given-outside window. Plan admission for stroke workup and emergent HD. Please complete VS/neuro checks(including NIH) q2h x 12h, then q4h.

## 2025-01-03 NOTE — ED Provider Notes (Signed)
 " [place] EMERGENCY DEPARTMENT AT [facility] Provider Note   CSN: [id number] Arrival date & time: 01/03/25  1846  An emergency department physician performed an initial assessment on this suspected stroke patient at 1854.  Patient presents with: Code Stroke   Brandon [name] is a 64 y.o. male.   HPI 64 year old male presents as a code stroke.  He developed acute right sided leg and arm weakness when he was in the car.  He was at a doctor's appointment around noon and that around 1:20 PM he noticed the weakness and could not get out of the car.  Ultimately came by POV to the ED.  Code stroke was activated in triage.  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  Had a mild headache earlier that is gone.  No vision or facial complaints or speech complaints.  He also notes when it was noted that he is a dialysis patient that his last dialysis was 1 week ago.  He states he has not been feeling well so he has not gone.  Prior to Admission medications  Medication Sig Start Date End Date Taking? Authorizing Provider  amLODipine  (NORVASC ) 10 MG tablet Take 1 tablet (10 mg total) by mouth daily. 04/08/24  Yes Dezii, Alexandra, DO  Calcium  Acetate 667 MG TABS Take 1,334 mg by mouth 3 (three) times daily with meals.   Yes [provider]  insulin  aspart (NOVOLOG  FLEXPEN) 100 UNIT/ML FlexPen Inject 0-25 Units into the skin 3 (three) times daily as needed (blood sugar > 200). 05/23/24  Yes Chandra Toribio POUR, MD  insulin  glargine (LANTUS ) 100 UNIT/ML injection Inject 25 Units into the skin 2 (two) times daily.   Yes [provider]  oxyCODONE -acetaminophen  (PERCOCET/ROXICET) 5-325 MG tablet Take 1 tablet by mouth every 4 (four) hours as needed. 12/28/24  Yes Harden Jerona GAILS, MD  sertraline  (ZOLOFT ) 100 MG tablet TAKE 1 TABLET BY MOUTH EVERY DAY 11/30/24  Yes Chandra Toribio POUR, MD  mupirocin  cream (BACTROBAN ) 2 % Apply 1  Application topically 2 (two) times daily. Patient not taking: Reported on 01/03/2025 05/23/24   Chandra Toribio POUR, MD  tadalafil (CIALIS) 20 MG tablet Take 20 mg by mouth daily as needed. Patient not taking: Reported on 12/26/2024 09/27/24   [provider]    Allergies: Apresoline  [hydralazine ], Imdur  [isosorbide  nitrate], and Ms contin  [morphine ]    Review of Systems  Neurological:  Positive for weakness and headaches. Negative for speech difficulty.    Updated Vital Signs BP (!) 159/86   Pulse 94   Temp 97.8 F (36.6 C)   Resp 19   SpO2 100%   Physical Exam Vitals and nursing note reviewed.  Constitutional:      General: He is not in acute distress.    Appearance: He is well-developed. He is not ill-appearing or diaphoretic.  HENT:     Head: Normocephalic and atraumatic.  Cardiovascular:     Rate and Rhythm: Normal rate and regular rhythm.     Heart sounds: Normal heart sounds.  Pulmonary:     Effort: Pulmonary effort is normal.     Breath sounds: Normal breath sounds.  Abdominal:     Palpations: Abdomen is soft.     Tenderness: There is no abdominal tenderness.  Skin:    General: Skin is warm and dry.  Neurological:     Mental Status: He is alert.     Comments: No facial  droop.  Clear speech.  Intact extraocular movements.  He has intact grip strength in both upper extremities and 5/5 strength in both upper extremities.  Both lower extremities are 3/5 and similar to each other.     (all labs ordered are listed, but only abnormal results are displayed) Labs Reviewed  PROTIME-INR - Abnormal; Notable for the following components:      Result Value   Prothrombin Time 17.2 (*)    INR 1.3 (*)    All other components within normal limits  APTT - Abnormal; Notable for the following components:   aPTT 40 (*)    All other components within normal limits  CBC - Abnormal; Notable for the following components:   RBC 3.89 (*)    Hemoglobin 12.1 (*)    HCT 37.7 (*)     All other components within normal limits  COMPREHENSIVE METABOLIC PANEL WITH GFR - Abnormal; Notable for the following components:   Potassium >7.5 (*)    Chloride 95 (*)    CO2 19 (*)    BUN 88 (*)    Creatinine, Ser 18.40 (*)    Calcium  7.7 (*)    Total Protein 9.0 (*)    Alkaline Phosphatase 127 (*)    GFR, Estimated 3 (*)    Anion gap 23 (*)    All other components within normal limits  I-STAT CHEM 8, ED - Abnormal; Notable for the following components:   Potassium 8.0 (*)    BUN 90 (*)    Creatinine, Ser >18.00 (*)    Calcium , Ion 0.81 (*)    Hemoglobin 12.9 (*)    HCT 38.0 (*)    All other components within normal limits  CBG MONITORING, ED - Abnormal; Notable for the following components:   Glucose-Capillary 34 (*)    All other components within normal limits  DIFFERENTIAL  ETHANOL  HEPATITIS B SURFACE ANTIGEN  HEPATITIS B SURFACE ANTIBODY, QUANTITATIVE  COMPREHENSIVE METABOLIC PANEL WITH GFR  CBC  CBG MONITORING, ED  CBG MONITORING, ED  CBG MONITORING, ED    EKG: EKG Interpretation Date/Time:  Friday January 03 2025 20:12:52 EST Ventricular Rate:  97 PR Interval:  233 QRS Duration:  113 QT Interval:  360 QTC Calculation: 458 R Axis:   6  Text Interpretation: Sinus rhythm Prolonged PR interval Borderline low voltage, extremity leads Anteroseptal infarct, old Peaked T waves V2 Confirmed by Freddi Hamilton [phone]) on 01/03/2025 8:39:00 PM  Radiology: CT ANGIO HEAD NECK W WO CM (CODE STROKE) Result Date: 01/03/2025 CLINICAL DATA:  Initial evaluation for acute neuro deficit, stroke suspected. EXAM: CT ANGIOGRAPHY HEAD AND NECK WITH AND WITHOUT CONTRAST TECHNIQUE: Multidetector CT imaging of the head and neck was performed using the standard protocol during bolus administration of intravenous contrast. Multiplanar CT image reconstructions and MIPs were obtained to evaluate the vascular anatomy. Carotid stenosis measurements (when applicable) are obtained  utilizing NASCET criteria, using the distal internal carotid diameter as the denominator. RADIATION DOSE REDUCTION: This exam was performed according to the departmental dose-optimization program which includes automated exposure control, adjustment of the mA and/or kV according to patient size and/or use of iterative reconstruction technique. CONTRAST:  75mL OMNIPAQUE  IOHEXOL  350 MG/ML SOLN COMPARISON:  CT from earlier the same day as well as prior MRA from 07/05/2021. FINDINGS: CTA NECK FINDINGS Aortic arch: Visualized aortic arch within normal limits for caliber standard branch pattern. No stenosis about the origin the great vessels. Right carotid system: Right common and internal carotid arteries  are patent without dissection. Mild atheromatous change about the right carotid bulb without hemodynamically significant stenosis. Left carotid system: Left common and internal carotid arteries are patent without dissection. Mild atheromatous change about the left carotid bulb without hemodynamically significant stenosis. Vertebral arteries: Both vertebral arteries arise from subclavian arteries. Vertebral arteries are patent without significant stenosis or dissection. 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 Degenerative osteoarthritic changes about the C1-2 articulations. No worrisome osseous lesions. Other neck: No other acute finding. Few scattered thyroid  nodules noted, largest of which measures 2 cm on the right. These have been previously evaluated by thyroid  ultrasound on 08/06/2011. Upper chest: No other acute finding. Review of the MIP images confirms the above findings CTA HEAD FINDINGS Anterior circulation: The carotid siphons with up to moderate stenoses bilaterally. A1 segments patent bilaterally. Normal anterior communicating complex. Anterior cerebral arteries patent without stenosis. Left M1 segment and distal left MCA branches are  patent without significant stenosis or occlusion. Right M1 segment patent proximally. There is a proximal right M2 occlusion, superior division (series 4, image 97). Overall appearance is similar as compared to prior MRA from 2022, consistent with a chronic finding. Inferior division and its distal branches remain patent and perfused. Posterior circulation: Both V4 segments patent without significant stenosis. Right vertebral artery slightly dominant. Neither PICA well seen. Short-segment fenestration at the vertebrobasilar junction noted. Basilar patent without stenosis. Superior cerebellar arteries patent bilaterally. Left PCA supplied via the basilar as well as a small left posterior communicating artery. Fetal type origin of the right PCA. Atheromatous irregularity about the left PCA with associated moderate left P2 stenosis (series 6, image 20). Right PCA patent without significant stenosis. Venous sinuses: Patent allowing for timing the contrast bolus. Anatomic variants: As above.  No aneurysm. Review of the MIP images confirms the above findings IMPRESSION: 1. Negative CTA for acute large vessel occlusion or other emergent finding. 2. Proximal right M2 occlusion, superior division. Overall appearance is similar as compared to prior MRA from 2022, consistent with a chronic finding. 3. Moderate left P2 stenosis. 4. Atheromatous change about the carotid siphons with up to moderate stenoses bilaterally. No hemodynamically significant stenosis within the neck. Electronically Signed   By: Morene Hoard M.D.   On: 01/03/2025 20:16   CT HEAD CODE STROKE WO CONTRAST Result Date: 01/03/2025 CLINICAL DATA:  Code stroke. Initial evaluation for acute neuro deficit, stroke suspected. EXAM: CT HEAD WITHOUT CONTRAST TECHNIQUE: Contiguous axial images were obtained from the base of the skull through the vertex without intravenous contrast. RADIATION DOSE REDUCTION: This exam was performed according to the  departmental dose-optimization program which includes automated exposure control, adjustment of the mA and/or kV according to patient size and/or use of iterative reconstruction technique. COMPARISON:  Comparison made with prior head CT from 03/18/2022. FINDINGS: Brain: Cerebral volume within normal limits. Encephalomalacia involving the right temporoparietal region, consistent with a chronic right MCA distribution infarct, stable. No acute intracranial hemorrhage. No acute large vessel territory infarct. No mass lesion or midline shift. No hydrocephalus or extra-axial fluid collection. Vascular: No abnormal hyperdense vessel. Skull: Scalp soft tissues within normal limits for calvarium intact. Sinuses/Orbits: Globes orbital soft tissues within normal limits. Paranasal sinuses and mastoid air cells are largely clear. Other: None. ASPECTS [facility] Stroke Program Early CT Score) - Ganglionic level infarction (caudate, lentiform nuclei, internal capsule, insula, M1-M3 cortex): 7 - Supraganglionic infarction (M4-M6 cortex): 3 Total score (0-10 with 10 being normal): 10 IMPRESSION: 1.  No acute intracranial abnormality. 2. ASPECTS is 10. 3. Chronic right MCA distribution infarct. These results were communicated to Dr. Lindzen at 7:07 pm on 01/03/2025 by text page via the [facility] messaging system. Electronically Signed   By: Morene Hoard M.D.   On: 01/03/2025 19:09     .Ultrasound ED Peripheral IV (Provider)  Date/Time: 01/03/2025 8:45 PM  Performed by: Freddi Hamilton, MD Authorized by: Freddi Hamilton, MD   Procedure details:    Indications: multiple failed IV attempts and poor IV access     Skin Prep: chlorhexidine  gluconate     Location:  Right AC   Angiocath:  18 G   Bedside Ultrasound Guided: Yes     Patient tolerated procedure without complications: Yes     Dressing applied: Yes   .Critical Care  Performed by: Freddi Hamilton, MD Authorized by: Freddi Hamilton, MD   Critical care provider  statement:    Critical care time (minutes):  45   Critical care time was exclusive of:  Separately billable procedures and treating other patients   Critical care was necessary to treat or prevent imminent or life-threatening deterioration of the following conditions:  Renal failure, metabolic crisis and CNS failure or compromise   Critical care was time spent personally by me on the following activities:  Development of treatment plan with patient or surrogate, discussions with consultants, evaluation of patient's response to treatment, examination of patient, ordering and review of laboratory studies, ordering and review of radiographic studies, ordering and performing treatments and interventions, pulse oximetry, re-evaluation of patient's condition and review of old charts    Medications Ordered in the ED  sodium zirconium cyclosilicate  (LOKELMA ) packet 10 g (10 g Oral Patient Refused/Not Given 01/03/25 1955)  Chlorhexidine  Gluconate Cloth 2 % PADS 6 each (has no administration in time range)  heparin  injection 5,000 Units (has no administration in time range)  acetaminophen  (TYLENOL ) tablet 650 mg (has no administration in time range)    Or  acetaminophen  (TYLENOL ) suppository 650 mg (has no administration in time range)  sodium chloride  flush (NS) 0.9 % injection 3 mL (3 mLs Intravenous Given 01/03/25 1952)  insulin  aspart (novoLOG ) injection 5 Units (5 Units Intravenous Given 01/03/25 1944)    And  dextrose  50 % solution 50 mL (50 mLs Intravenous Given 01/03/25 1944)  albuterol  (PROVENTIL ) (2.5 MG/3ML) 0.083% nebulizer solution 10 mg (10 mg Nebulization Given 01/03/25 2005)  iohexol  (OMNIPAQUE ) 350 MG/ML injection 75 mL (75 mLs Intravenous Contrast Given 01/03/25 1952)  calcium  gluconate inj 10% (1 g) URGENT USE ONLY! (1 g Intravenous Given 01/03/25 2020)  dextrose  50 % solution 50 mL (50 mLs Intravenous Given 01/03/25 2111)  dextrose  50 % solution 25 mL (25 mLs Intravenous Given 01/03/25 2128)     Clinical Course as of 01/03/25 2252  Fri Jan 03, 2025  1941 Patient is currently getting his CTA and perfusion.  I-STAT Chem-8 has come back and shows significant abnormalities from missed dialysis.  Potassium is near 8.  I have ordered temporizing measures, and informs the nurses with him that he will need to be taken back straight to his room when CTA is finished and will need to start hyperkalemia treatments immediately. [SG]    Clinical Course User Index [SG] Freddi Hamilton, MD                                 Medical Decision Making Amount and/or Complexity of  Data Reviewed Labs: ordered.    Details: Hyperkalemia Radiology: ordered and independent interpretation performed.    Details: No head bleed, old stroke ECG/medicine tests: ordered and independent interpretation performed.    Details: Mildly peaked T waves  Risk OTC drugs. Prescription drug management. Decision regarding hospitalization.   Patient presents with acute strokelike symptoms.  Patient is out of the TNK window.  CTA does not show any evidence of an LVO.  However he was noted to have hyperkalemia as above and has missed at least 1 week of dialysis.  Treated with calcium , insulin , glucose, albuterol .  He declined the Lokelma .  POA who is at the bedside notes he has psychiatric and neurocognitive issues which sometimes limit treatments.  He is otherwise stable and is in no distress.  Treatments were given as above and I discussed with Dr. Melia of nephrology who will take him to dialysis urgently.  I discussed with the hospitalist for admission.  While I was taking care of another patient, he apparently had a brief hypoglycemic episode that responded to food and drink and a half amp of D50.     Final diagnoses:  Hyperkalemia  Right sided weakness    ED Discharge Orders     None          Freddi Hamilton, MD 01/03/25 2318  "

## 2025-01-03 NOTE — ED Notes (Signed)
 Report called dialysis RN and report called to 3W RN. Pt to go straight to 3W from dialysis.

## 2025-01-03 NOTE — Consult Note (Signed)
 Reason for Consult: ESRD Referring Physician:  Dr. Freddi  Chief Complaint: Right-sided weakness   OP HD: [facility]  From Jan 2025 ->  4h  2k  95kg  B350  [facility]  Lt BCF  Assessment/Plan: # ESRD with lifethreatening hyperkalemia K>8 - on HD TTS, last HD here in the hospital a week ago on Fri - plan for HD tonight emergently w/ 1K bath 1st 2  hours and will consider a shorter treatment tomorrow second shift depending what time he finishes overnight.   Additional recommendations - Dose all meds for creatinine clearance < 10 ml/min  - Unless absolutely necessary, no MRIs with gadolinium.  - Prefer needle sticks in the dorsum of the hands or wrists.  No blood pressure measurements in right arm. - If blood transfusion is requested during hemodialysis sessions, please alert us  prior to the session.  - If a hemodialysis catheter line culture is requested, please alert us  as only hemodialysis nurses are able to collect those specimens.   Avoid nephrotoxic medications including NSAIDs and iodinated intravenous contrast exposure unless the latter is absolutely indicated.  Preferred narcotic agents for pain control are hydromorphone , fentanyl , and methadone. Morphine  should not be used. Avoid Baclofen and avoid oral sodium phosphate and magnesium  citrate based laxatives / bowel preps. Continue strict Input and Output monitoring. Will monitor the patient closely with you and intervene or adjust therapy as indicated by changes in clinical status/labs     # HTN - high bp's here; restart home regimen and UF on dialysis will help as well     # Volume - not grossly overloaded on exam - pt seems to state they have to pull a lot of fluid on him - UF 3-4 L as tolerated     # Anemia of esrd - Hb 12.9; no esa needs  # Renal osteodystrophy -Check phosphorus for binder management     # s/p right fifth ray ampututation - on 1/23 by Dr Harden   HPI: Brandon [name] is an 64 y.o. male  with a history of ischemic stroke in 2014, autism, hypertension, bilateral renal carcinoma status post cryoablation, bipolar disorder, CHF, diabetes with a history of DKA in the past, hyperlipidemia, seizures, OSA but not on CPAP, ESRD.  Patient presenting with right-sided weakness and numbness with a history of residual left-sided hemiparesis from a prior CVA.  He states that he had sudden onset of this right sided weakness and numbness when he was in the car after going to a doctor's appointment, unable to get out of the car.  Patient's last dialysis treatment was a week ago but will need confirmation.  Patient recently had a right fifth ray amputation on 1/23 by Dr. Harden.  CT of the head revealed no acute abnormality.  ROS Per HPI  Chemistry and CBC: Creat  Date/Time Value Ref Range Status  06/02/2017 07:19 AM 1.23 0.70 - 1.33 mg/dL Final    Comment:      For patients > or = 62 years of age: The upper reference limit for Creatinine is approximately 13% higher for people identified as African-American.     05/23/2017 10:37 AM 1.69 (H) 0.70 - 1.33 mg/dL Final    Comment:      For patients > or = 62 years of age: The upper reference limit for Creatinine is approximately 13% higher for people identified as African-American.     10/19/2015 12:01 AM 0.96 0.70 - 1.33 mg/dL Final  94/80/7983 87:98  AM 1.05 0.50 - 1.35 mg/dL Final  91/74/7984 90:89 AM 1.19 0.50 - 1.35 mg/dL Final  91/86/7985 91:58 AM 1.03 0.50 - 1.35 mg/dL Final  93/82/7985 89:54 AM 1.01 0.50 - 1.35 mg/dL Final  87/96/7987 95:49 AM 0.87 0.50 - 1.35 mg/dL Final   Creatinine, Ser  Date/Time Value Ref Range Status  01/03/2025 07:36 PM >18.00 (H) 0.61 - 1.24 mg/dL Final  98/69/7973 92:99 PM 18.40 (H) 0.61 - 1.24 mg/dL Final  98/76/7973 93:45 AM 17.00 (H) 0.61 - 1.24 mg/dL Final  90/80/7974 93:79 PM 20.43 (H) 0.61 - 1.24 mg/dL Final  90/80/7974 97:88 PM 18.70 (H) 0.61 - 1.24 mg/dL Final  91/77/7974 89:62 AM 9.11 (H) 0.76 -  1.27 mg/dL Final    Comment:    **Verified by repeat analysis**  06/12/2024 09:51 AM 14.10 (H) 0.61 - 1.24 mg/dL Final  94/94/7974 92:75 AM 11.75 (H) 0.61 - 1.24 mg/dL Final  94/95/7974 92:85 AM 9.56 (H) 0.61 - 1.24 mg/dL Final  94/96/7974 96:48 AM 13.38 (H) 0.61 - 1.24 mg/dL Final  94/97/7974 88:51 AM 20.74 (H) 0.61 - 1.24 mg/dL Final  94/97/7974 95:49 AM 20.57 (H) 0.61 - 1.24 mg/dL Final  94/98/7974 91:52 AM 19.53 (H) 0.61 - 1.24 mg/dL Final  95/73/7974 94:52 AM 11.86 (H) 0.61 - 1.24 mg/dL Final  95/74/7974 93:88 AM 16.26 (H) 0.61 - 1.24 mg/dL Final  95/75/7974 91:77 PM 16.21 (H) 0.61 - 1.24 mg/dL Final  95/76/7974 92:40 PM 14.75 (H) 0.61 - 1.24 mg/dL Final  96/72/7974 87:52 PM 20.61 (H) 0.61 - 1.24 mg/dL Final  96/82/7974 91:99 PM 15.39 (H) 0.61 - 1.24 mg/dL Final  97/79/7975 96:68 PM 7.49 (H) 0.61 - 1.24 mg/dL Final  88/87/7976 96:80 AM 4.98 (H) 0.61 - 1.24 mg/dL Final  88/88/7976 97:91 AM 6.80 (H) 0.61 - 1.24 mg/dL Final  88/89/7976 87:76 PM 6.50 (H) 0.61 - 1.24 mg/dL Final  88/89/7976 97:50 AM 5.88 (H) 0.61 - 1.24 mg/dL Final  88/90/7976 95:62 AM 4.73 (H) 0.61 - 1.24 mg/dL Final  88/91/7976 93:54 AM 6.97 (H) 0.61 - 1.24 mg/dL Final  88/92/7976 95:54 AM 5.77 (H) 0.61 - 1.24 mg/dL Final  88/93/7976 95:62 AM 8.10 (H) 0.61 - 1.24 mg/dL Final  88/94/7976 95:92 AM 7.29 (H) 0.61 - 1.24 mg/dL Final  88/95/7976 96:52 AM 9.93 (H) 0.61 - 1.24 mg/dL Final  88/96/7976 97:66 AM 9.26 (H) 0.61 - 1.24 mg/dL Final  88/97/7976 96:84 AM 11.77 (H) 0.61 - 1.24 mg/dL Final  88/98/7976 95:97 AM 11.54 (H) 0.61 - 1.24 mg/dL Final  89/68/7976 95:59 AM 11.30 (H) 0.61 - 1.24 mg/dL Final  89/69/7976 93:98 AM 11.03 (H) 0.61 - 1.24 mg/dL Final  89/70/7976 95:79 AM 11.00 (H) 0.61 - 1.24 mg/dL Final  94/83/7976 89:66 AM 6.23 (H) 0.76 - 1.27 mg/dL Final  95/85/7976 90:71 AM 4.60 (H) 0.61 - 1.24 mg/dL Final  97/71/7976 96:56 PM 3.99 (H) 0.61 - 1.24 mg/dL Final  87/68/7977 97:92 AM 4.19 (H) 0.61 - 1.24 mg/dL  Final  87/69/7977 97:74 AM 4.43 (H) 0.61 - 1.24 mg/dL Final  87/70/7977 96:81 AM 4.17 (H) 0.61 - 1.24 mg/dL Final  87/71/7977 91:57 AM 3.81 (H) 0.61 - 1.24 mg/dL Final  87/72/7977 91:63 AM 3.98 (H) 0.61 - 1.24 mg/dL Final  87/73/7977 97:70 AM 3.40 (H) 0.61 - 1.24 mg/dL Final  87/73/7977 97:71 AM 3.38 (H) 0.61 - 1.24 mg/dL Final  88/84/7977 95:70 PM 2.33 (H) 0.76 - 1.27 mg/dL Final  89/81/7977 92:46 AM 2.79 (H) 0.61 - 1.24 mg/dL Final  89/82/7977  04:11 AM 2.99 (H) 0.61 - 1.24 mg/dL Final  89/83/7977 92:97 AM 2.95 (H) 0.61 - 1.24 mg/dL Final  89/84/7977 97:93 AM 3.35 (H) 0.61 - 1.24 mg/dL Final  89/85/7977 93:42 AM 3.72 (H) 0.61 - 1.24 mg/dL Final   Recent Labs  Lab 01/03/25 1900 01/03/25 1936  NA 138 137  K >7.5* 8.0*  CL 95* 105  CO2 19*  --   GLUCOSE 75 77  BUN 88* 90*  CREATININE 18.40* >18.00*  CALCIUM  7.7*  --    Recent Labs  Lab 01/03/25 1900 01/03/25 1936  WBC 6.6  --   NEUTROABS 4.3  --   HGB 12.1* 12.9*  HCT 37.7* 38.0*  MCV 96.9  --   PLT 244  --    Liver Function Tests: Recent Labs  Lab 01/03/25 1900  AST 18  ALT <5  ALKPHOS 127*  BILITOT 0.5  PROT 9.0*  ALBUMIN 4.2   No results for input(s): LIPASE, AMYLASE in the last 168 hours. No results for input(s): AMMONIA in the last 168 hours. Cardiac Enzymes: No results for input(s): CKTOTAL, CKMB, CKMBINDEX, TROPONINI in the last 168 hours. Iron Studies: No results for input(s): IRON, TIBC, TRANSFERRIN, FERRITIN in the last 72 hours. PT/INR: @LABRCNTIP (inr:5)  Xrays/Other Studies: ) Results for orders placed or performed during the hospital encounter of 01/03/25 (from the past 48 hours)  Protime-INR     Status: Abnormal   Collection Time: 01/03/25  7:00 PM  Result Value Ref Range   Prothrombin Time 17.2 (H) 11.4 - 15.2 seconds   INR 1.3 (H) 0.8 - 1.2    Comment: (NOTE) INR goal varies based on device and disease states. Performed at [facility], [address],  [place], [place] [address]   APTT     Status: Abnormal   Collection Time: 01/03/25  7:00 PM  Result Value Ref Range   aPTT 40 (H) 24 - 36 seconds    Comment:        IF BASELINE aPTT IS ELEVATED, SUGGEST PATIENT RISK ASSESSMENT BE USED TO DETERMINE APPROPRIATE ANTICOAGULANT THERAPY. Performed at [facility], [address], [place], [place] [address]   CBC     Status: Abnormal   Collection Time: 01/03/25  7:00 PM  Result Value Ref Range   WBC 6.6 4.0 - 10.5 K/uL   RBC 3.89 (L) 4.22 - 5.81 MIL/uL   Hemoglobin 12.1 (L) 13.0 - 17.0 g/dL   HCT 62.2 (L) 60.9 - 47.9 %   MCV 96.9 80.0 - 100.0 fL   MCH 31.1 26.0 - 34.0 pg   MCHC 32.1 30.0 - 36.0 g/dL   RDW 86.7 88.4 - 84.4 %   Platelets 244 150 - 400 K/uL   nRBC 0.0 0.0 - 0.2 %    Comment: Performed at [facility], [address], [place], [place] [address]  Differential     Status: None   Collection Time: 01/03/25  7:00 PM  Result Value Ref Range   Neutrophils Relative % 65 %   Neutro Abs 4.3 1.7 - 7.7 K/uL   Lymphocytes Relative 24 %   Lymphs Abs 1.6 0.7 - 4.0 K/uL   Monocytes Relative 8 %   Monocytes Absolute 0.5 0.1 - 1.0 K/uL   Eosinophils Relative 2 %   Eosinophils Absolute 0.1 0.0 - 0.5 K/uL   Basophils Relative 1 %   Basophils Absolute 0.0 0.0 - 0.1 K/uL   Immature Granulocytes 0 %  Abs Immature Granulocytes 0.02 0.00 - 0.07 K/uL    Comment: Performed at [facility], [address], [place], [place] [address]  Comprehensive metabolic panel     Status: Abnormal   Collection Time: 01/03/25  7:00 PM  Result Value Ref Range   Sodium 138 135 - 145 mmol/L   Potassium >7.5 (HH) 3.5 - 5.1 mmol/L    Comment: Critical Value, Read Back and verified with GASQUE, S RN @2036  01/03/25 MAB OKAY TO RELEASE PER MD    Chloride 95 (L) 98 - 111 mmol/L   CO2 19 (L) 22 - 32 mmol/L   Glucose, Bld 75 70 - 99 mg/dL    Comment: Glucose reference range applies only to samples taken after fasting for at least 8 hours.   BUN  88 (H) 8 - 23 mg/dL   Creatinine, Ser 81.59 (H) 0.61 - 1.24 mg/dL   Calcium  7.7 (L) 8.9 - 10.3 mg/dL    Comment: OKAY TO RELEASE PER MD   Total Protein 9.0 (H) 6.5 - 8.1 g/dL   Albumin 4.2 3.5 - 5.0 g/dL   AST 18 15 - 41 U/L   ALT <5 0 - 44 U/L   Alkaline Phosphatase 127 (H) 38 - 126 U/L   Total Bilirubin 0.5 0.0 - 1.2 mg/dL   GFR, Estimated 3 (L) >60 mL/min    Comment: (NOTE) Calculated using the CKD-EPI Creatinine Equation (2021)    Anion gap 23 (H) 5 - 15    Comment: Performed at [facility], [address] [place], [place] [address]  Ethanol     Status: None   Collection Time: 01/03/25  7:00 PM  Result Value Ref Range   Alcohol, Ethyl (B) <15 <15 mg/dL    Comment: (NOTE) For medical purposes only. Performed at [facility], [address] [place], [place] [address]   CBG monitoring, ED     Status: None   Collection Time: 01/03/25  7:02 PM  Result Value Ref Range   Glucose-Capillary 90 70 - 99 mg/dL    Comment: Glucose reference range applies only to samples taken after fasting for at least 8 hours.  I-stat chem 8, ED     Status: Abnormal   Collection Time: 01/03/25  7:36 PM  Result Value Ref Range   Sodium 137 135 - 145 mmol/L   Potassium 8.0 (HH) 3.5 - 5.1 mmol/L   Chloride 105 98 - 111 mmol/L   BUN 90 (H) 8 - 23 mg/dL   Creatinine, Ser >81.99 (H) 0.61 - 1.24 mg/dL   Glucose, Bld 77 70 - 99 mg/dL    Comment: Glucose reference range applies only to samples taken after fasting for at least 8 hours.   Calcium , Ion 0.81 (LL) 1.15 - 1.40 mmol/L   TCO2 24 22 - 32 mmol/L   Hemoglobin 12.9 (L) 13.0 - 17.0 g/dL   HCT 61.9 (L) 60.9 - 47.9 %   Comment NOTIFIED PHYSICIAN   CBG monitoring, ED     Status: None   Collection Time: 01/03/25  8:26 PM  Result Value Ref Range   Glucose-Capillary 88 70 - 99 mg/dL    Comment: Glucose reference range applies only to samples taken after fasting for at least 8 hours.   CT ANGIO HEAD NECK W WO CM (CODE STROKE) Result  Date: 01/03/2025 CLINICAL DATA:  Initial evaluation for acute neuro deficit, stroke suspected. EXAM: CT ANGIOGRAPHY HEAD AND NECK WITH AND WITHOUT CONTRAST TECHNIQUE: Multidetector  CT imaging of the head and neck was performed using the standard protocol during bolus administration of intravenous contrast. Multiplanar CT image reconstructions and MIPs were obtained to evaluate the vascular anatomy. Carotid stenosis measurements (when applicable) are obtained utilizing NASCET criteria, using the distal internal carotid diameter as the denominator. RADIATION DOSE REDUCTION: This exam was performed according to the departmental dose-optimization program which includes automated exposure control, adjustment of the mA and/or kV according to patient size and/or use of iterative reconstruction technique. CONTRAST:  75mL OMNIPAQUE  IOHEXOL  350 MG/ML SOLN COMPARISON:  CT from earlier the same day as well as prior MRA from 07/05/2021. FINDINGS: CTA NECK FINDINGS Aortic arch: Visualized aortic arch within normal limits for caliber standard branch pattern. No stenosis about the origin the great vessels. Right carotid system: Right common and internal carotid arteries are patent without dissection. Mild atheromatous change about the right carotid bulb without hemodynamically significant stenosis. Left carotid system: Left common and internal carotid arteries are patent without dissection. Mild atheromatous change about the left carotid bulb without hemodynamically significant stenosis. Vertebral arteries: Both vertebral arteries arise from subclavian arteries. Vertebral arteries are patent without significant stenosis or dissection. 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 Degenerative osteoarthritic changes about the C1-2 articulations. No worrisome osseous lesions. Other neck: No other acute finding. Few scattered thyroid  nodules noted, largest of which  measures 2 cm on the right. These have been previously evaluated by thyroid  ultrasound on 08/06/2011. Upper chest: No other acute finding. Review of the MIP images confirms the above findings CTA HEAD FINDINGS Anterior circulation: The carotid siphons with up to moderate stenoses bilaterally. A1 segments patent bilaterally. Normal anterior communicating complex. Anterior cerebral arteries patent without stenosis. Left M1 segment and distal left MCA branches are patent without significant stenosis or occlusion. Right M1 segment patent proximally. There is a proximal right M2 occlusion, superior division (series 4, image 97). Overall appearance is similar as compared to prior MRA from 2022, consistent with a chronic finding. Inferior division and its distal branches remain patent and perfused. Posterior circulation: Both V4 segments patent without significant stenosis. Right vertebral artery slightly dominant. Neither PICA well seen. Short-segment fenestration at the vertebrobasilar junction noted. Basilar patent without stenosis. Superior cerebellar arteries patent bilaterally. Left PCA supplied via the basilar as well as a small left posterior communicating artery. Fetal type origin of the right PCA. Atheromatous irregularity about the left PCA with associated moderate left P2 stenosis (series 6, image 20). Right PCA patent without significant stenosis. Venous sinuses: Patent allowing for timing the contrast bolus. Anatomic variants: As above.  No aneurysm. Review of the MIP images confirms the above findings IMPRESSION: 1. Negative CTA for acute large vessel occlusion or other emergent finding. 2. Proximal right M2 occlusion, superior division. Overall appearance is similar as compared to prior MRA from 2022, consistent with a chronic finding. 3. Moderate left P2 stenosis. 4. Atheromatous change about the carotid siphons with up to moderate stenoses bilaterally. No hemodynamically significant stenosis within the  neck. Electronically Signed   By: Morene Hoard M.D.   On: 01/03/2025 20:16   CT HEAD CODE STROKE WO CONTRAST Result Date: 01/03/2025 CLINICAL DATA:  Code stroke. Initial evaluation for acute neuro deficit, stroke suspected. EXAM: CT HEAD WITHOUT CONTRAST TECHNIQUE: Contiguous axial images were obtained from the base of the skull through the vertex without intravenous contrast. RADIATION DOSE REDUCTION: This exam was performed according to the departmental dose-optimization program which includes automated  exposure control, adjustment of the mA and/or kV according to patient size and/or use of iterative reconstruction technique. COMPARISON:  Comparison made with prior head CT from 03/18/2022. FINDINGS: Brain: Cerebral volume within normal limits. Encephalomalacia involving the right temporoparietal region, consistent with a chronic right MCA distribution infarct, stable. No acute intracranial hemorrhage. No acute large vessel territory infarct. No mass lesion or midline shift. No hydrocephalus or extra-axial fluid collection. Vascular: No abnormal hyperdense vessel. Skull: Scalp soft tissues within normal limits for calvarium intact. Sinuses/Orbits: Globes orbital soft tissues within normal limits. Paranasal sinuses and mastoid air cells are largely clear. Other: None. ASPECTS [facility] Stroke Program Early CT Score) - Ganglionic level infarction (caudate, lentiform nuclei, internal capsule, insula, M1-M3 cortex): 7 - Supraganglionic infarction (M4-M6 cortex): 3 Total score (0-10 with 10 being normal): 10 IMPRESSION: 1. No acute intracranial abnormality. 2. ASPECTS is 10. 3. Chronic right MCA distribution infarct. These results were communicated to Dr. Lindzen at 7:07 pm on 01/03/2025 by text page via the [facility] messaging system. Electronically Signed   By: Morene Hoard M.D.   On: 01/03/2025 19:09    PMH:   Past Medical History:  Diagnosis Date   Acute ischemic stroke (HCC) 11/2013   Allergy     Anemia    hx   Anxiety    Arthritis    Autism spectrum    high functioning per SO ib 06/11/24   Benign hypertension with CKD (chronic kidney disease) stage III (HCC)    Dialysis T T S   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 ischemic stroke   GERD (gastroesophageal reflux disease)    Hiatal hernia    Hyperlipidemia    Hypertension    Seizures (HCC)    none since 2016, taking Keppra  - maw   Sleep apnea    does not use CPAP   Thyroid  disease    weak thyroid  per MD   Wears glasses     PSH:   Past Surgical History:  Procedure Laterality Date   A/V SHUNT INTERVENTION Left 06/03/2024   Procedure: A/V SHUNT INTERVENTION;  Surgeon: Magda Debby SAILOR, MD;  Location: [facility];  Service: Cardiovascular;  Laterality: Left;   AMPUTATION Right 12/27/2024   Procedure: RIGHT FIFTH RAY AMPUTATION;  Surgeon: Harden Jerona GAILS, MD;  Location: [facility] OR;  Service: Orthopedics;  Laterality: Right;   AV FISTULA PLACEMENT Left 10/14/2022   Procedure: LEFT BRACHIOCAPHALIC FISTULA CREATION;  Surgeon: Gretta Lonni PARAS, MD;  Location: [facility] OR;  Service: Vascular;  Laterality: Left;   BREAST BIOPSY Left 03/24/2023   US  LT BREAST BX W LOC DEV 1ST LESION IMG BX SPEC US  GUIDE 03/24/2023 [facility] MAMMOGRAPHY   COLONOSCOPY     DIALYSIS/PERMA CATHETER INSERTION N/A 04/08/2024   Procedure: DIALYSIS/PERMA CATHETER INSERTION;  Surgeon: Marea Selinda RAMAN, MD;  Location: [facility] INVASIVE CV LAB;  Service: Cardiovascular;  Laterality: N/A;   IR DIALY SHUNT INTRO NEEDLE/INTRACATH INITIAL W/IMG  LEFT Left 03/29/2024  IR FLUORO GUIDE CV LINE RIGHT  10/06/2022   IR RADIOLOGIST EVAL & MGMT  04/04/2017   IR US  GUIDE VASC ACCESS RIGHT  10/06/2022   KIDNEY SURGERY     ablation of renal cell CA - 12/28, prior one was October 2012-Dr. Yamagata   MRI  11/2021   brain   REVISON OF ARTERIOVENOUS FISTULA Left 06/12/2024   Procedure: REVISON OF ARTERIOVENOUS FISTULA;  Surgeon: Magda Debby SAILOR, MD;  Location: [facility] OR;  Service: Vascular;  Laterality: Left;   TEE WITHOUT CARDIOVERSION N/A 11/19/2013   Procedure: TRANSESOPHAGEAL ECHOCARDIOGRAM (TEE);  Surgeon: Redell GORMAN Shallow, MD;  Location: [facility] ENDOSCOPY;  Service: Cardiovascular;  Laterality: N/A;   TEMPORARY DIALYSIS CATHETER N/A 04/04/2024   Procedure: TEMPORARY DIALYSIS CATHETER;  Surgeon: Marea Selinda GORMAN, MD;  Location: [facility] INVASIVE CV LAB;  Service: Cardiovascular;  Laterality: N/A;   TUNNELLED CATHETER EXCHANGE N/A 10/11/2024   Procedure: TUNNELLED CATHETER EXCHANGE;  Surgeon: Serene Gaile ORN, MD;  Location: [facility];  Service: Cardiovascular;  Laterality: N/A;    Allergies: Allergies[1]  Medications:   Prior to Admission medications  Medication Sig Start Date End Date Taking? Authorizing Provider  amLODipine  (NORVASC ) 10 MG tablet Take 1 tablet (10 mg total) by mouth daily. 04/08/24  Yes Dezii, Alexandra, DO  Calcium  Acetate 667 MG TABS Take 1,334 mg by mouth 3 (three) times daily with meals.   Yes [provider]  insulin  aspart (NOVOLOG  FLEXPEN) 100 UNIT/ML FlexPen Inject 0-25 Units into the skin 3 (three) times daily as needed (blood sugar > 200). 05/23/24  Yes Chandra Toribio POUR, MD  insulin  glargine (LANTUS ) 100 UNIT/ML injection Inject 25 Units into the skin 2 (two) times daily.   Yes [provider]  oxyCODONE -acetaminophen  (PERCOCET/ROXICET) 5-325 MG tablet Take 1 tablet by mouth every 4 (four) hours as needed. 12/28/24  Yes Harden Jerona GAILS, MD  sertraline  (ZOLOFT ) 100 MG tablet TAKE 1 TABLET BY MOUTH EVERY DAY 11/30/24   Yes Chandra Toribio POUR, MD  mupirocin  cream (BACTROBAN ) 2 % Apply 1 Application topically 2 (two) times daily. Patient not taking: Reported on 01/03/2025 05/23/24   Chandra Toribio POUR, MD  tadalafil (CIALIS) 20 MG tablet Take 20 mg by mouth daily as needed. Patient not taking: Reported on 12/26/2024 09/27/24   [provider]    Discontinued Meds:   Medications Discontinued During This Encounter  Medication Reason   Glucose Blood (BLOOD GLUCOSE TEST STRIPS) STRP Patient Preference   Influenza Vac Subunit Quad (FLUCELVAX QUADRIVALENT IM) Patient Preference   Blood Glucose Monitoring Suppl DEVI Patient Preference   Lancet Device MISC Patient Preference   Lancets (ACCU-CHEK SOFT TOUCH) lancets Patient Preference   Lancets Misc. MISC Patient Preference    Social History:  reports that he has never smoked. He has never been exposed to tobacco smoke. He has never used smokeless tobacco. He reports that he does not currently use alcohol. He reports that he does not use drugs.  Family History:   Family History  Problem Relation Age of Onset   Pneumonia Mother    Breast cancer Mother    Diabetes Father    Heart disease Father    Breast cancer Sister    Prostate cancer Other    Stomach cancer Other    Colon cancer Neg Hx    Esophageal cancer Neg Hx    Rectal cancer Neg Hx     Blood pressure (!) 161/90, pulse 96, temperature (!) 97.5 F (36.4 C), resp. rate 10,  SpO2 100%. Gen alert, no distress Sclera anicteric, throat clear  No jvd or bruits Chest clear bilat to bases RRR no MRG Abd soft ntnd no mass or ascites +bs Ext no LE or UE edema, no other edema Neuro is alert, Ox 3 , nf LIJ TDC intact + Lt BCF + bruit       Darrell Hauk, LYNWOOD ORN, MD 01/03/2025, 9:08 PM      [1]  Allergies Allergen Reactions   Apresoline  [Hydralazine ] Other (See Comments)    Chest pain if by mouth- can tolerate it via IV   Imdur  [Isosorbide  Nitrate] Other (See Comments)    Headaches   Ms Contin   [Morphine ] Itching

## 2025-01-03 NOTE — ED Notes (Signed)
 Attempted to complete stroke swallow screen, pt states I dont have to do nothing, get your words right, educated pt on purpose of swallow screen and need for [facility], pt states my potassium is fine, its gets high. Ive done this before. Pt continues to refuse and understands he is unable to eat and drink if swallow screen is not completed. Pt refuses Lokalema also. MD notified and to bedside. Pt A&Ox4.

## 2025-01-03 NOTE — ED Notes (Signed)
 Called to activate code stroke per charge

## 2025-01-03 NOTE — ED Notes (Signed)
 Cbg 99 15 mins after one amp D50, pt received 5 units IV novolog  1.5 hrs ago, verbal order to admin 25ml D50 at this time. Pt given and tolerating food and drink well. Report given to dialysis RN.

## 2025-01-04 ENCOUNTER — Inpatient Hospital Stay (HOSPITAL_COMMUNITY)

## 2025-01-04 ENCOUNTER — Encounter (HOSPITAL_COMMUNITY): Payer: Self-pay | Admitting: Student

## 2025-01-04 ENCOUNTER — Other Ambulatory Visit: Payer: Self-pay

## 2025-01-04 DIAGNOSIS — Z992 Dependence on renal dialysis: Secondary | ICD-10-CM | POA: Diagnosis not present

## 2025-01-04 DIAGNOSIS — E1122 Type 2 diabetes mellitus with diabetic chronic kidney disease: Secondary | ICD-10-CM | POA: Diagnosis not present

## 2025-01-04 DIAGNOSIS — N186 End stage renal disease: Secondary | ICD-10-CM | POA: Diagnosis not present

## 2025-01-04 DIAGNOSIS — E875 Hyperkalemia: Secondary | ICD-10-CM | POA: Diagnosis not present

## 2025-01-04 DIAGNOSIS — Z91158 Patient's noncompliance with renal dialysis for other reason: Secondary | ICD-10-CM | POA: Diagnosis not present

## 2025-01-04 DIAGNOSIS — I120 Hypertensive chronic kidney disease with stage 5 chronic kidney disease or end stage renal disease: Secondary | ICD-10-CM | POA: Diagnosis not present

## 2025-01-04 DIAGNOSIS — E878 Other disorders of electrolyte and fluid balance, not elsewhere classified: Secondary | ICD-10-CM | POA: Diagnosis not present

## 2025-01-04 DIAGNOSIS — G928 Other toxic encephalopathy: Secondary | ICD-10-CM

## 2025-01-04 DIAGNOSIS — R531 Weakness: Secondary | ICD-10-CM

## 2025-01-04 LAB — COMPREHENSIVE METABOLIC PANEL WITH GFR
ALT: <5 U/L (ref 0–44)
AST: 18 U/L (ref 15–41)
Albumin: 3.4 g/dL — ABNORMAL LOW (ref 3.5–5.0)
Alkaline Phosphatase: 106 U/L (ref 38–126)
Anion gap: 17 — ABNORMAL HIGH (ref 5–15)
BUN: 48 mg/dL — ABNORMAL HIGH (ref 8–23)
CO2: 22 mmol/L (ref 22–32)
Calcium: 7.7 mg/dL — ABNORMAL LOW (ref 8.9–10.3)
Chloride: 95 mmol/L — ABNORMAL LOW (ref 98–111)
Creatinine, Ser: 11.3 mg/dL — ABNORMAL HIGH (ref 0.61–1.24)
GFR, Estimated: 5 mL/min — ABNORMAL LOW
Glucose, Bld: 155 mg/dL — ABNORMAL HIGH (ref 70–99)
Potassium: 5.7 mmol/L — ABNORMAL HIGH (ref 3.5–5.1)
Sodium: 134 mmol/L — ABNORMAL LOW (ref 135–145)
Total Bilirubin: 0.5 mg/dL (ref 0.0–1.2)
Total Protein: 7.4 g/dL (ref 6.5–8.1)

## 2025-01-04 LAB — CBC
HCT: 32.3 % — ABNORMAL LOW (ref 39.0–52.0)
Hemoglobin: 10.5 g/dL — ABNORMAL LOW (ref 13.0–17.0)
MCH: 30.8 pg (ref 26.0–34.0)
MCHC: 32.5 g/dL (ref 30.0–36.0)
MCV: 94.7 fL (ref 80.0–100.0)
Platelets: 181 10*3/uL (ref 150–400)
RBC: 3.41 MIL/uL — ABNORMAL LOW (ref 4.22–5.81)
RDW: 13.3 % (ref 11.5–15.5)
WBC: 4.7 10*3/uL (ref 4.0–10.5)
nRBC: 0 % (ref 0.0–0.2)

## 2025-01-04 LAB — PHOSPHORUS: Phosphorus: 5.5 mg/dL — ABNORMAL HIGH (ref 2.5–4.6)

## 2025-01-04 LAB — GLUCOSE, CAPILLARY
Glucose-Capillary: 133 mg/dL — ABNORMAL HIGH (ref 70–99)
Glucose-Capillary: 152 mg/dL — ABNORMAL HIGH (ref 70–99)
Glucose-Capillary: 99 mg/dL (ref 70–99)
Glucose-Capillary: 125 mg/dL — ABNORMAL HIGH (ref 70–99)
Glucose-Capillary: 85 mg/dL (ref 70–99)

## 2025-01-04 MED ORDER — CALCIUM ACETATE (PHOS BINDER) 667 MG PO CAPS
1334.0000 mg | ORAL_CAPSULE | Freq: Three times a day (TID) | ORAL | Status: DC
  Administered 2025-01-04 – 2025-01-06 (×6): 1334 mg via ORAL
  Filled 2025-01-04 (×7): qty 2

## 2025-01-04 MED ORDER — NEPRO/CARBSTEADY PO LIQD
237.0000 mL | Freq: Three times a day (TID) | ORAL | Status: DC
  Administered 2025-01-04 – 2025-01-06 (×4): 237 mL via ORAL

## 2025-01-04 MED ORDER — HEPARIN SODIUM (PORCINE) 1000 UNIT/ML IJ SOLN
1000.0000 [IU] | INTRAMUSCULAR | Status: DC | PRN
  Administered 2025-01-04: 4200 [IU]
  Administered 2025-01-05: 1900 [IU]
  Filled 2025-01-04: qty 1

## 2025-01-04 MED ORDER — SERTRALINE HCL 100 MG PO TABS
100.0000 mg | ORAL_TABLET | Freq: Every day | ORAL | Status: DC
  Administered 2025-01-04 – 2025-01-06 (×3): 100 mg via ORAL
  Filled 2025-01-04 (×3): qty 1

## 2025-01-04 MED ORDER — OXYCODONE HCL 5 MG PO TABS
5.0000 mg | ORAL_TABLET | Freq: Four times a day (QID) | ORAL | Status: DC | PRN
  Administered 2025-01-04 – 2025-01-06 (×3): 5 mg via ORAL
  Filled 2025-01-04 (×3): qty 1

## 2025-01-04 MED ORDER — HEPARIN SODIUM (PORCINE) 1000 UNIT/ML IJ SOLN
INTRAMUSCULAR | Status: AC
  Filled 2025-01-04: qty 4

## 2025-01-04 MED ORDER — ASPIRIN 81 MG PO TBEC
81.0000 mg | DELAYED_RELEASE_TABLET | Freq: Every day | ORAL | Status: DC
  Administered 2025-01-04 – 2025-01-06 (×3): 81 mg via ORAL
  Filled 2025-01-04 (×3): qty 1

## 2025-01-04 MED ORDER — DIPHENHYDRAMINE HCL 25 MG PO CAPS: 25.0000 mg | ORAL_CAPSULE | Freq: Four times a day (QID) | ORAL | Status: DC | PRN

## 2025-01-04 MED ORDER — INSULIN ASPART 100 UNIT/ML IJ SOLN
0.0000 [IU] | Freq: Three times a day (TID) | INTRAMUSCULAR | Status: DC
  Administered 2025-01-04: 1 [IU] via SUBCUTANEOUS
  Administered 2025-01-04: 2 [IU] via SUBCUTANEOUS
  Administered 2025-01-05: 1 [IU] via SUBCUTANEOUS
  Filled 2025-01-04 (×2): qty 1
  Filled 2025-01-04: qty 2

## 2025-01-04 MED ORDER — INSULIN ASPART 100 UNIT/ML IJ SOLN: 0.0000 [IU] | Freq: Every day | INTRAMUSCULAR | Status: DC

## 2025-01-04 MED ORDER — SODIUM ZIRCONIUM CYCLOSILICATE 10 G PO PACK
10.0000 g | PACK | Freq: Once | ORAL | Status: AC
  Administered 2025-01-04: 10 g via ORAL
  Filled 2025-01-04: qty 1

## 2025-01-04 NOTE — Evaluation (Signed)
 Physical Therapy Evaluation Patient Details Name: Brandon [name] MRN: [id number] DOB: 1961/03/21 Today's Date: 01/04/2025  History of Present Illness  Pt is a 64 y.o. M adm 01/03/25 for severe hyperkalemia. Pt has missed multiple dialysis sessions and has had difficulty with walking and getting in/out of the car. CTH and CTA (-). MRI brain pending. 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   Clinical Impression  Pt admitted with above diagnosis. PTA, pt was modI for functional mobility, ADLs, and IADLs. He lives alone in an apartment with elevator access. Pt is planning to d/c to his friend's two story condo which has 5-6 STE and a flight to the bed/bathroom. Pt currently with functional limitations due to the deficits listed below (see PT Problem List). He required CGA for bed mobility, transfers, and gait using RW. Pt is currently limited by pain, weight bearing status (RLE TDWB), decreased balance, and reduced activity tolerance. He was unable to limit weight to only his right heel in post-op shoe while mobilizing, deferred further ambulation. Pt will benefit from acute skilled PT to increase his independence and safety with mobility to allow discharge. Recommend HHPT to improve balance, decrease fall risk, and optimize safety within the home environment.      If plan is discharge home, recommend the following: A little help with walking and/or transfers;A little help with bathing/dressing/bathroom;Assistance with cooking/housework;Assist for transportation;Help with stairs or ramp for entrance   Can travel by private vehicle        Equipment Recommendations Rolling walker (2 wheels)  Recommendations for Other Services       Functional Status Assessment Patient has had a recent decline in their functional status and demonstrates the  ability to make significant improvements in function in a reasonable and predictable amount of time.     Precautions / Restrictions Precautions Precautions: Fall Recall of Precautions/Restrictions: Intact Required Braces or Orthoses: Other Brace Other Brace: Post-Op Shoe RLE Restrictions Weight Bearing Restrictions Per Provider Order: Yes RLE Weight Bearing Per Provider Order: Touchdown weight bearing      Mobility  Bed Mobility Overal bed mobility: Needs Assistance Bed Mobility: Supine to Sit     Supine to sit: Contact guard, HOB elevated     General bed mobility comments: Pt sat up on L side of bed with increased time. Light assist to elevate trunk and scoot hips fwd.    Transfers Overall transfer level: Needs assistance Equipment used: Rolling walker (2 wheels) Transfers: Sit to/from Stand, Bed to chair/wheelchair/BSC Sit to Stand: Contact guard assist   Step pivot transfers: Contact guard assist       General transfer comment: Pt stood from lowest bed height. Cued proper hand placement using RW. Powered up with light assist. Transferred to recliner chair. Fair eccentric control. Pt had difficulty adhereing to TDWB on R foot, emphasized to only accept weight into heel but pt frequently appearing to put full weight on his R foot.    Ambulation/Gait Ambulation/Gait assistance: Contact guard assist Gait Distance (Feet): 30 Feet Assistive device: Rolling walker (2 wheels) Gait Pattern/deviations: Step-to pattern, Decreased step length - right, Decreased step length - left, Trunk flexed Gait velocity: decr Gait velocity interpretation: <1.31 ft/sec, indicative of household ambulator   General Gait Details: Educated pt on proper use of RW. Demonstrated correct technique to limit WBing on RLE. Pt took  short slow steps. Instructed pt to only accept weight into his R heel, but often observed flat foot contact. He demonstrated a fwd lean. Cues for closer proximity to AD. Pt  intermittently lifted AD off ground. Instructed pt to maintain all four points of the AD on the ground at all times. He was unsteady especially while turning allowing LE to get outside AD. Deferred further gait distance d/t inability to maintain WBing status.  Stairs            Wheelchair Mobility     Tilt Bed    Modified Rankin (Stroke Patients Only)       Balance Overall balance assessment: Needs assistance Sitting-balance support: No upper extremity supported, Feet supported Sitting balance-Leahy Scale: Fair Sitting balance - Comments: Sat EOB   Standing balance support: Bilateral upper extremity supported, During functional activity, Reliant on assistive device for balance Standing balance-Leahy Scale: Poor Standing balance comment: Pt dependent on RW                             Pertinent Vitals/Pain Pain Assessment Pain Assessment: 0-10 Pain Score: 6  Pain Location: RLE (foot>leg) Pain Descriptors / Indicators: Discomfort, Aching, Sore Pain Intervention(s): Premedicated before session, Monitored during session, Limited activity within patient's tolerance, Repositioned    Home Living Family/patient expects to be discharged to:: Private residence Living Arrangements: Non-relatives/Friends Available Help at Discharge: Family;Available PRN/intermittently (pt reports nearly 24/7 coverage) Type of Home: Other(Comment) (Condo) Home Access: Stairs to enter Entrance Stairs-Rails: Right;Left;Can reach both Entrance Stairs-Number of Steps: 5-6 Alternate Level Stairs-Number of Steps: Flight Home Layout: Two level;Bed/bath upstairs Home Equipment: Toilet riser;Shower seat;Grab bars - tub/shower;Cane - single point Additional Comments: Pt is planning to d/c to his friends house, which is the above set-up. He personally has an apartment with elevator access.    Prior Function Prior Level of Function : Independent/Modified Independent             Mobility  Comments: Ambulates without AD. 1 fall in the past 30mo. ADLs Comments: Indep with ADLs. Manages his own meds. Pt doesn't drive d/t loss of peripheral vision.     Extremity/Trunk Assessment   Upper Extremity Assessment Upper Extremity Assessment: Overall WFL for tasks assessed    Lower Extremity Assessment Lower Extremity Assessment: RLE deficits/detail RLE Deficits / Details: Pt POD 8 s/p fifth met amp. Deferred ankle/foot assessment d/t pain. Pt wearing post-op shoe. RLE: Unable to fully assess due to pain RLE Sensation: decreased light touch RLE Coordination: decreased gross motor    Cervical / Trunk Assessment Cervical / Trunk Assessment: Normal  Communication   Communication Communication: No apparent difficulties    Cognition Arousal: Alert Behavior During Therapy: WFL for tasks assessed/performed   PT - Cognitive impairments: No family/caregiver present to determine baseline, Safety/Judgement                       PT - Cognition Comments: Pt A,Ox4. He has difficulty adhereing to weight bearing status. Following commands: Intact       Cueing Cueing Techniques: Verbal cues     General Comments      Exercises     Assessment/Plan    PT Assessment Patient needs continued PT services  PT Problem List Decreased activity tolerance;Decreased balance;Decreased mobility;Decreased knowledge of use of DME;Decreased safety awareness;Decreased knowledge of precautions       PT Treatment Interventions DME instruction;Gait training;Stair training;Functional mobility training;Therapeutic activities;Therapeutic  exercise;Cognitive remediation;Patient/family education    PT Goals (Current goals can be found in the Care Plan section)  Acute Rehab PT Goals Patient Stated Goal: Regain independence PT Goal Formulation: With patient Time For Goal Achievement: 01/18/25 Potential to Achieve Goals: Good    Frequency Min 2X/week     Co-evaluation                AM-PAC PT 6 Clicks Mobility  Outcome Measure Help needed turning from your back to your side while in a flat bed without using bedrails?: A Little Help needed moving from lying on your back to sitting on the side of a flat bed without using bedrails?: A Little Help needed moving to and from a bed to a chair (including a wheelchair)?: A Little Help needed standing up from a chair using your arms (e.g., wheelchair or bedside chair)?: A Little Help needed to walk in hospital room?: A Little Help needed climbing 3-5 steps with a railing? : A Lot 6 Click Score: 17    End of Session Equipment Utilized During Treatment: Gait belt Activity Tolerance: Patient tolerated treatment well Patient left: in chair;with call bell/phone within reach;with chair alarm set Nurse Communication: Mobility status;Weight bearing status PT Visit Diagnosis: Difficulty in walking, not elsewhere classified (R26.2);Other abnormalities of gait and mobility (R26.89);Unsteadiness on feet (R26.81)    Time: 8773-8746 PT Time Calculation (min) (ACUTE ONLY): 27 min   Charges:   PT Evaluation $PT Eval Moderate Complexity: 1 Mod PT Treatments $Gait Training: 8-22 mins PT General Charges $$ ACUTE PT VISIT: 1 Visit         Randall SAUNDERS, PT, DPT Acute Rehabilitation Services Office: [phone] Secure Chat Preferred   Delon CHRISTELLA Callander 01/04/2025, 1:22 PM

## 2025-01-04 NOTE — Progress Notes (Signed)
 "  [facility] PROGRESS NOTE   Brandon [name] FMW:[id number] DOB: April 04, 1961 DOA: 01/03/2025  PCP: Chandra Toribio POUR, MD  Brief History: 64 y.o. male with a history of ischemic stroke in 2014, autism, hypertension, bilateral renal carcinoma status post cryoablation, bipolar disorder, CHF, diabetes with a history of DKA in the past, hyperlipidemia, seizures, OSA but not on CPAP, ESRD.  Patient recently underwent right fifth ray amputation by Dr. Harden.  He went to orthopedic office for follow-up but had difficult time getting out of the car.  But he did not make the appointment and then went grocery shopping but then could not get out of the car.  He was driven to the emergency department.  He also had nausea vomiting a few times.  Apparently had missed multiple dialysis sessions.  He was found to have severe hyperkalemia.  There was also concern for acute neurological event.  Patient was hospitalized for further management.   Consultants: Neurology.  Nephrology  Procedures: Hemodialysis    Subjective/Interval History: Patient mentioned that he feels well this morning.  Eating his breakfast.  Denies any weakness on the right side.  He is not very clear about his right arm and leg weakness.  He does mention that he has a previous history of stroke with left-sided weakness which is also significantly improved over the years.  He denies any chest pain or shortness of breath this morning.  No nausea or vomiting.    Assessment/Plan:  Right-sided weakness Patient with previous history of stroke with left-sided deficits. History was not very clear yesterday.  Not clear today either. His neurological symptoms were thought to be secondary to his electrolyte imbalance but stroke cannot be ruled out.  CT angiogram did not show any large vessel occlusion.  CT head did not show any acute findings.  MRI brain is pending. Does not seem to have neurological deficits this morning. Not noted to be on  any antiplatelets at this time.  Will initiate aspirin  for now.  End-stage renal disease/hyperkalemia He is dialyzed on Tuesday Thursday Saturday schedule but has missed multiple sessions recently. Came in with potassium level of 8.  Seen by nephrology last night and urgently dialyzed.  Potassium level has improved to 5.7. He may be dialyzed again later today.  Normocytic anemia Likely anemia of chronic kidney disease.  Essential hypertension Amlodipine  on hold.  Monitor blood pressures.  Diabetes mellitus type 2 in the setting of kidney complications Monitor CBGs.  SSI.  Did have episodes of hypoglycemia yesterday.  Recent right fifth ray amputation for osteomyelitis This was done by Dr. Harden on 1/23.  Touchdown weightbearing was mentioned on the discharge instructions.  Patient not clear about dressing changes etc.  History of anxiety Resume sertraline    DVT Prophylaxis: Subcutaneous heparin  Code Status: Full code Family Communication: Discussed with patient.  No family at bedside Disposition Plan: To be determined  Status is: Inpatient Remains inpatient appropriate because: Acute neurological event/end-stage renal disease/hyperkalemia      Medications: Scheduled:  Chlorhexidine  Gluconate Cloth  6 each Topical Q0600   feeding supplement (NEPRO CARB STEADY)  237 mL Oral TID BM   heparin   5,000 Units Subcutaneous Q8H   insulin  aspart  0-5 Units Subcutaneous QHS   insulin  aspart  0-9 Units Subcutaneous TID WC   sodium zirconium cyclosilicate   10 g Oral Once   Continuous: PRN:acetaminophen  **OR** acetaminophen , diphenhydrAMINE , heparin  sodium (porcine), oxyCODONE     Objective:  Vital Signs  Vitals:   01/04/25 0217  01/04/25 0334 01/04/25 0338 01/04/25 0819  BP: 139/88 (!) 164/79 (!) 164/79 134/80  Pulse: 85 87 87 85  Resp: 14 20  17   Temp: 97.9 F (36.6 C) 97.8 F (36.6 C) 97.8 F (36.6 C) 99.1 F (37.3 C)  TempSrc:  Oral Oral Oral  SpO2: 100% 100% 100% 99%   Weight:  104.4 kg    Height:  6' 1 (1.854 m)      Intake/Output Summary (Last 24 hours) at 01/04/2025 0909 Last data filed at 01/04/2025 0217 Gross per 24 hour  Intake --  Output 2400 ml  Net -2400 ml   Filed Weights   01/04/25 0334  Weight: 104.4 kg    General appearance: Awake alert.  In no distress Resp: Clear to auscultation bilaterally.  Normal effort Cardio: S1-S2 is normal regular.  No S3-S4.  No rubs murmurs or bruit GI: Abdomen is soft.  Nontender nondistended.  Bowel sounds are present normal.  No masses organomegaly Extremities: Right foot covered with dressing. Neurologic: Alert and oriented x3.  No significant neurological deficits noted.  Able to move his right arm and leg.   Lab Results:  Data Reviewed: I have personally reviewed following labs and reports of the imaging studies  CBC: Recent Labs  Lab 01/03/25 1900 01/03/25 1936 01/04/25 0641  WBC 6.6  --  4.7  NEUTROABS 4.3  --   --   HGB 12.1* 12.9* 10.5*  HCT 37.7* 38.0* 32.3*  MCV 96.9  --  94.7  PLT 244  --  181    Basic Metabolic Panel: Recent Labs  Lab 01/03/25 1900 01/03/25 1936 01/04/25 0641  NA 138 137 134*  K >7.5* 8.0* 5.7*  CL 95* 105 95*  CO2 19*  --  22  GLUCOSE 75 77 155*  BUN 88* 90* 48*  CREATININE 18.40* >18.00* 11.30*  CALCIUM  7.7*  --  7.7*    GFR: Estimated Creatinine Clearance: 8.7 mL/min (A) (by C-G formula based on SCr of 11.3 mg/dL (H)).  Liver Function Tests: Recent Labs  Lab 01/03/25 1900 01/04/25 0641  AST 18 18  ALT <5 <5  ALKPHOS 127* 106  BILITOT 0.5 0.5  PROT 9.0* 7.4  ALBUMIN 4.2 3.4*    Coagulation Profile: Recent Labs  Lab 01/03/25 1900  INR 1.3*    CBG: Recent Labs  Lab 01/03/25 2026 01/03/25 2108 01/03/25 2123 01/04/25 0240 01/04/25 0619  GLUCAP 88 34* 99 133* 152*     Radiology Studies: CT ANGIO HEAD NECK W WO CM (CODE STROKE) Result Date: 01/03/2025 CLINICAL DATA:  Initial evaluation for acute neuro deficit, stroke  suspected. EXAM: CT ANGIOGRAPHY HEAD AND NECK WITH AND WITHOUT CONTRAST TECHNIQUE: Multidetector CT imaging of the head and neck was performed using the standard protocol during bolus administration of intravenous contrast. Multiplanar CT image reconstructions and MIPs were obtained to evaluate the vascular anatomy. Carotid stenosis measurements (when applicable) are obtained utilizing NASCET criteria, using the distal internal carotid diameter as the denominator. RADIATION DOSE REDUCTION: This exam was performed according to the departmental dose-optimization program which includes automated exposure control, adjustment of the mA and/or kV according to patient size and/or use of iterative reconstruction technique. CONTRAST:  75mL OMNIPAQUE  IOHEXOL  350 MG/ML SOLN COMPARISON:  CT from earlier the same day as well as prior MRA from 07/05/2021. FINDINGS: CTA NECK FINDINGS Aortic arch: Visualized aortic arch within normal limits for caliber standard branch pattern. No stenosis about the origin the great vessels. Right carotid system: Right common and  internal carotid arteries are patent without dissection. Mild atheromatous change about the right carotid bulb without hemodynamically significant stenosis. Left carotid system: Left common and internal carotid arteries are patent without dissection. Mild atheromatous change about the left carotid bulb without hemodynamically significant stenosis. Vertebral arteries: Both vertebral arteries arise from subclavian arteries. Vertebral arteries are patent without significant stenosis or dissection. 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 Degenerative osteoarthritic changes about the C1-2 articulations. No worrisome osseous lesions. Other neck: No other acute finding. Few scattered thyroid  nodules noted, largest of which measures 2 cm on the right. These have been previously evaluated by thyroid  ultrasound  on 08/06/2011. Upper chest: No other acute finding. Review of the MIP images confirms the above findings CTA HEAD FINDINGS Anterior circulation: The carotid siphons with up to moderate stenoses bilaterally. A1 segments patent bilaterally. Normal anterior communicating complex. Anterior cerebral arteries patent without stenosis. Left M1 segment and distal left MCA branches are patent without significant stenosis or occlusion. Right M1 segment patent proximally. There is a proximal right M2 occlusion, superior division (series 4, image 97). Overall appearance is similar as compared to prior MRA from 2022, consistent with a chronic finding. Inferior division and its distal branches remain patent and perfused. Posterior circulation: Both V4 segments patent without significant stenosis. Right vertebral artery slightly dominant. Neither PICA well seen. Short-segment fenestration at the vertebrobasilar junction noted. Basilar patent without stenosis. Superior cerebellar arteries patent bilaterally. Left PCA supplied via the basilar as well as a small left posterior communicating artery. Fetal type origin of the right PCA. Atheromatous irregularity about the left PCA with associated moderate left P2 stenosis (series 6, image 20). Right PCA patent without significant stenosis. Venous sinuses: Patent allowing for timing the contrast bolus. Anatomic variants: As above.  No aneurysm. Review of the MIP images confirms the above findings IMPRESSION: 1. Negative CTA for acute large vessel occlusion or other emergent finding. 2. Proximal right M2 occlusion, superior division. Overall appearance is similar as compared to prior MRA from 2022, consistent with a chronic finding. 3. Moderate left P2 stenosis. 4. Atheromatous change about the carotid siphons with up to moderate stenoses bilaterally. No hemodynamically significant stenosis within the neck. Electronically Signed   By: Morene Hoard M.D.   On: 01/03/2025 20:16   CT  HEAD CODE STROKE WO CONTRAST Result Date: 01/03/2025 CLINICAL DATA:  Code stroke. Initial evaluation for acute neuro deficit, stroke suspected. EXAM: CT HEAD WITHOUT CONTRAST TECHNIQUE: Contiguous axial images were obtained from the base of the skull through the vertex without intravenous contrast. RADIATION DOSE REDUCTION: This exam was performed according to the departmental dose-optimization program which includes automated exposure control, adjustment of the mA and/or kV according to patient size and/or use of iterative reconstruction technique. COMPARISON:  Comparison made with prior head CT from 03/18/2022. FINDINGS: Brain: Cerebral volume within normal limits. Encephalomalacia involving the right temporoparietal region, consistent with a chronic right MCA distribution infarct, stable. No acute intracranial hemorrhage. No acute large vessel territory infarct. No mass lesion or midline shift. No hydrocephalus or extra-axial fluid collection. Vascular: No abnormal hyperdense vessel. Skull: Scalp soft tissues within normal limits for calvarium intact. Sinuses/Orbits: Globes orbital soft tissues within normal limits. Paranasal sinuses and mastoid air cells are largely clear. Other: None. ASPECTS [facility] Stroke Program Early CT Score) - Ganglionic level infarction (caudate, lentiform nuclei, internal capsule, insula, M1-M3 cortex): 7 - Supraganglionic infarction (M4-M6 cortex): 3 Total score (0-10 with 10 being normal):  10 IMPRESSION: 1. No acute intracranial abnormality. 2. ASPECTS is 10. 3. Chronic right MCA distribution infarct. These results were communicated to Dr. Lindzen at 7:07 pm on 01/03/2025 by text page via the [facility] messaging system. Electronically Signed   By: Morene Hoard M.D.   On: 01/03/2025 19:09       LOS: 1 day   Merville Hijazi  [facility] Pager on [email or web address]  01/04/2025, 9:09 AM   "

## 2025-01-04 NOTE — Plan of Care (Signed)

## 2025-01-04 NOTE — Plan of Care (Signed)
 MRI brain completed and reviewed.  No acute infarct.  Recommendations as in the follow-up note from today. Please recall neurology with questions as needed  Eligio Lav, MD Neurology

## 2025-01-04 NOTE — Progress Notes (Signed)
 Brandon [name] is an 64 y.o. male with a history of ischemic stroke in 2014, autism, hypertension, bilateral renal carcinoma status post cryoablation, bipolar disorder, CHF, diabetes with a history of DKA in the past, hyperlipidemia, seizures, OSA but not on CPAP, ESRD.  Patient presenting with right-sided weakness and numbness with a history of residual left-sided hemiparesis from a prior CVA.  He states that he had sudden onset of this right sided weakness and numbness when he was in the car after going to a doctor's appointment, unable to get out of the car.  Patient's last dialysis treatment was a week ago. Patient recently had a right fifth ray amputation on 1/23 by Dr. Harden.  CT of the head revealed no acute abnormality.   OP HD: [facility]  From Jan 2025 ->  4h  2k  96.5 kg  B350  [facility]  Lt BCF No heparin , last outpt HD was on 1/17.  Assessment/Plan:  # ESRD with lifethreatening hyperkalemia K>8 - on HD TTS, last HD here in the hospital a week ago on Fri - HD emergently on 1/30 w/ 1K bath 1st 2  hours and will also dialyze for 3hr today with 1K 1st hour. Trying to use LUA for arterial to decrease risk of recirculation.  If K still high after treatment today would get a catheter exchange.   Additional recommendations - Dose all meds for creatinine clearance < 10 ml/min  - Unless absolutely necessary, no MRIs with gadolinium.  - Prefer needle sticks in the dorsum of the hands or wrists.  No blood pressure measurements in right arm. - If blood transfusion is requested during hemodialysis sessions, please alert us  prior to the session.  - If a hemodialysis catheter line culture is requested, please alert us  as only hemodialysis nurses are able to collect those specimens.    Avoid nephrotoxic medications including NSAIDs and iodinated intravenous contrast exposure unless the latter is absolutely indicated.  Preferred narcotic agents for pain control are hydromorphone , fentanyl ,  and methadone. Morphine  should not be used. Avoid Baclofen and avoid oral sodium phosphate and magnesium  citrate based laxatives / bowel preps. Continue strict Input and Output monitoring. Will monitor the patient closely with you and intervene or adjust therapy as indicated by changes in clinical status/labs      # HTN - high bp's here; restart home regimen and UF on dialysis will help as well     # Volume - not grossly overloaded on exam - pt seems to state they have to pull a lot of fluid on him - UF 3-4 L as tolerated     # Anemia of esrd - Hb 12.9; no esa needs   # Renal osteodystrophy -Check phosphorus for binder management; was not drawn     # s/p right fifth ray ampututation - on 1/23 by Dr Harden  Subjective: Denies f/c/n/v/sob; tolerated HD yest. Has appetite.   Chemistry and CBC: Creat  Date/Time Value Ref Range Status  06/02/2017 07:19 AM 1.23 0.70 - 1.33 mg/dL Final    Comment:      For patients > or = 62 years of age: The upper reference limit for Creatinine is approximately 13% higher for people identified as African-American.     05/23/2017 10:37 AM 1.69 (H) 0.70 - 1.33 mg/dL Final    Comment:      For patients > or = 62 years of age: The upper reference limit for Creatinine is approximately 13% higher for people  identified as African-American.     10/19/2015 12:01 AM 0.96 0.70 - 1.33 mg/dL Final  94/80/7983 87:98 AM 1.05 0.50 - 1.35 mg/dL Final  91/74/7984 90:89 AM 1.19 0.50 - 1.35 mg/dL Final  91/86/7985 91:58 AM 1.03 0.50 - 1.35 mg/dL Final  93/82/7985 89:54 AM 1.01 0.50 - 1.35 mg/dL Final  87/96/7987 95:49 AM 0.87 0.50 - 1.35 mg/dL Final   Creatinine, Ser  Date/Time Value Ref Range Status  01/04/2025 06:41 AM 11.30 (H) 0.61 - 1.24 mg/dL Final  98/69/7973 92:63 PM >18.00 (H) 0.61 - 1.24 mg/dL Final  98/69/7973 92:99 PM 18.40 (H) 0.61 - 1.24 mg/dL Final  98/76/7973 93:45 AM 17.00 (H) 0.61 - 1.24 mg/dL Final  90/80/7974 93:79 PM 20.43 (H) 0.61  - 1.24 mg/dL Final  90/80/7974 97:88 PM 18.70 (H) 0.61 - 1.24 mg/dL Final  91/77/7974 89:62 AM 9.11 (H) 0.76 - 1.27 mg/dL Final    Comment:    **Verified by repeat analysis**  06/12/2024 09:51 AM 14.10 (H) 0.61 - 1.24 mg/dL Final  94/94/7974 92:75 AM 11.75 (H) 0.61 - 1.24 mg/dL Final  94/95/7974 92:85 AM 9.56 (H) 0.61 - 1.24 mg/dL Final  94/96/7974 96:48 AM 13.38 (H) 0.61 - 1.24 mg/dL Final  94/97/7974 88:51 AM 20.74 (H) 0.61 - 1.24 mg/dL Final  94/97/7974 95:49 AM 20.57 (H) 0.61 - 1.24 mg/dL Final  94/98/7974 91:52 AM 19.53 (H) 0.61 - 1.24 mg/dL Final  95/73/7974 94:52 AM 11.86 (H) 0.61 - 1.24 mg/dL Final  95/74/7974 93:88 AM 16.26 (H) 0.61 - 1.24 mg/dL Final  95/75/7974 91:77 PM 16.21 (H) 0.61 - 1.24 mg/dL Final  95/76/7974 92:40 PM 14.75 (H) 0.61 - 1.24 mg/dL Final  96/72/7974 87:52 PM 20.61 (H) 0.61 - 1.24 mg/dL Final  96/82/7974 91:99 PM 15.39 (H) 0.61 - 1.24 mg/dL Final  97/79/7975 96:68 PM 7.49 (H) 0.61 - 1.24 mg/dL Final  88/87/7976 96:80 AM 4.98 (H) 0.61 - 1.24 mg/dL Final  88/88/7976 97:91 AM 6.80 (H) 0.61 - 1.24 mg/dL Final  88/89/7976 87:76 PM 6.50 (H) 0.61 - 1.24 mg/dL Final  88/89/7976 97:50 AM 5.88 (H) 0.61 - 1.24 mg/dL Final  88/90/7976 95:62 AM 4.73 (H) 0.61 - 1.24 mg/dL Final  88/91/7976 93:54 AM 6.97 (H) 0.61 - 1.24 mg/dL Final  88/92/7976 95:54 AM 5.77 (H) 0.61 - 1.24 mg/dL Final  88/93/7976 95:62 AM 8.10 (H) 0.61 - 1.24 mg/dL Final  88/94/7976 95:92 AM 7.29 (H) 0.61 - 1.24 mg/dL Final  88/95/7976 96:52 AM 9.93 (H) 0.61 - 1.24 mg/dL Final  88/96/7976 97:66 AM 9.26 (H) 0.61 - 1.24 mg/dL Final  88/97/7976 96:84 AM 11.77 (H) 0.61 - 1.24 mg/dL Final  88/98/7976 95:97 AM 11.54 (H) 0.61 - 1.24 mg/dL Final  89/68/7976 95:59 AM 11.30 (H) 0.61 - 1.24 mg/dL Final  89/69/7976 93:98 AM 11.03 (H) 0.61 - 1.24 mg/dL Final  89/70/7976 95:79 AM 11.00 (H) 0.61 - 1.24 mg/dL Final  94/83/7976 89:66 AM 6.23 (H) 0.76 - 1.27 mg/dL Final  95/85/7976 90:71 AM 4.60 (H) 0.61 - 1.24 mg/dL  Final  97/71/7976 96:56 PM 3.99 (H) 0.61 - 1.24 mg/dL Final  87/68/7977 97:92 AM 4.19 (H) 0.61 - 1.24 mg/dL Final  87/69/7977 97:74 AM 4.43 (H) 0.61 - 1.24 mg/dL Final  87/70/7977 96:81 AM 4.17 (H) 0.61 - 1.24 mg/dL Final  87/71/7977 91:57 AM 3.81 (H) 0.61 - 1.24 mg/dL Final  87/72/7977 91:63 AM 3.98 (H) 0.61 - 1.24 mg/dL Final  87/73/7977 97:70 AM 3.40 (H) 0.61 - 1.24 mg/dL Final  87/73/7977 97:71 AM 3.38 (  H) 0.61 - 1.24 mg/dL Final  88/84/7977 95:70 PM 2.33 (H) 0.76 - 1.27 mg/dL Final  89/81/7977 92:46 AM 2.79 (H) 0.61 - 1.24 mg/dL Final  89/82/7977 95:88 AM 2.99 (H) 0.61 - 1.24 mg/dL Final  89/83/7977 92:97 AM 2.95 (H) 0.61 - 1.24 mg/dL Final  89/84/7977 97:93 AM 3.35 (H) 0.61 - 1.24 mg/dL Final   Recent Labs  Lab 01/03/25 1900 01/03/25 1936 01/04/25 0641  NA 138 137 134*  K >7.5* 8.0* 5.7*  CL 95* 105 95*  CO2 19*  --  22  GLUCOSE 75 77 155*  BUN 88* 90* 48*  CREATININE 18.40* >18.00* 11.30*  CALCIUM  7.7*  --  7.7*   Recent Labs  Lab 01/03/25 1900 01/03/25 1936 01/04/25 0641  WBC 6.6  --  4.7  NEUTROABS 4.3  --   --   HGB 12.1* 12.9* 10.5*  HCT 37.7* 38.0* 32.3*  MCV 96.9  --  94.7  PLT 244  --  181   Liver Function Tests: Recent Labs  Lab 01/03/25 1900 01/04/25 0641  AST 18 18  ALT <5 <5  ALKPHOS 127* 106  BILITOT 0.5 0.5  PROT 9.0* 7.4  ALBUMIN 4.2 3.4*   No results for input(s): LIPASE, AMYLASE in the last 168 hours. No results for input(s): AMMONIA in the last 168 hours. Cardiac Enzymes: No results for input(s): CKTOTAL, CKMB, CKMBINDEX, TROPONINI in the last 168 hours. Iron Studies: No results for input(s): IRON, TIBC, TRANSFERRIN, FERRITIN in the last 72 hours. PT/INR: @LABRCNTIP (inr:5)  Xrays/Other Studies: ) Results for orders placed or performed during the hospital encounter of 01/03/25 (from the past 48 hours)  Protime-INR     Status: Abnormal   Collection Time: 01/03/25  7:00 PM  Result Value Ref Range    Prothrombin Time 17.2 (H) 11.4 - 15.2 seconds   INR 1.3 (H) 0.8 - 1.2    Comment: (NOTE) INR goal varies based on device and disease states. Performed at [facility], [address], [place], [place] [address]   APTT     Status: Abnormal   Collection Time: 01/03/25  7:00 PM  Result Value Ref Range   aPTT 40 (H) 24 - 36 seconds    Comment:        IF BASELINE aPTT IS ELEVATED, SUGGEST PATIENT RISK ASSESSMENT BE USED TO DETERMINE APPROPRIATE ANTICOAGULANT THERAPY. Performed at [facility], [address] [place], [place] [address]   CBC     Status: Abnormal   Collection Time: 01/03/25  7:00 PM  Result Value Ref Range   WBC 6.6 4.0 - 10.5 K/uL   RBC 3.89 (L) 4.22 - 5.81 MIL/uL   Hemoglobin 12.1 (L) 13.0 - 17.0 g/dL   HCT 62.2 (L) 60.9 - 47.9 %   MCV 96.9 80.0 - 100.0 fL   MCH 31.1 26.0 - 34.0 pg   MCHC 32.1 30.0 - 36.0 g/dL   RDW 86.7 88.4 - 84.4 %   Platelets 244 150 - 400 K/uL   nRBC 0.0 0.0 - 0.2 %    Comment: Performed at [facility], [address], [place], [place] [address]  Differential     Status: None   Collection Time: 01/03/25  7:00 PM  Result Value Ref Range   Neutrophils Relative % 65 %   Neutro Abs 4.3 1.7 - 7.7 K/uL   Lymphocytes Relative 24 %   Lymphs Abs 1.6 0.7 - 4.0 K/uL   Monocytes Relative 8 %  Monocytes Absolute 0.5 0.1 - 1.0 K/uL   Eosinophils Relative 2 %   Eosinophils Absolute 0.1 0.0 - 0.5 K/uL   Basophils Relative 1 %   Basophils Absolute 0.0 0.0 - 0.1 K/uL   Immature Granulocytes 0 %   Abs Immature Granulocytes 0.02 0.00 - 0.07 K/uL    Comment: Performed at [facility], [address], [place], [place] [address]  Comprehensive metabolic panel     Status: Abnormal   Collection Time: 01/03/25  7:00 PM  Result Value Ref Range   Sodium 138 135 - 145 mmol/L   Potassium >7.5 (HH) 3.5 - 5.1 mmol/L    Comment: Critical Value, Read Back and verified with GASQUE, S RN @2036  01/03/25 MAB OKAY TO RELEASE PER MD     Chloride 95 (L) 98 - 111 mmol/L   CO2 19 (L) 22 - 32 mmol/L   Glucose, Bld 75 70 - 99 mg/dL    Comment: Glucose reference range applies only to samples taken after fasting for at least 8 hours.   BUN 88 (H) 8 - 23 mg/dL   Creatinine, Ser 81.59 (H) 0.61 - 1.24 mg/dL   Calcium  7.7 (L) 8.9 - 10.3 mg/dL    Comment: OKAY TO RELEASE PER MD   Total Protein 9.0 (H) 6.5 - 8.1 g/dL   Albumin 4.2 3.5 - 5.0 g/dL   AST 18 15 - 41 U/L   ALT <5 0 - 44 U/L   Alkaline Phosphatase 127 (H) 38 - 126 U/L   Total Bilirubin 0.5 0.0 - 1.2 mg/dL   GFR, Estimated 3 (L) >60 mL/min    Comment: (NOTE) Calculated using the CKD-EPI Creatinine Equation (2021)    Anion gap 23 (H) 5 - 15    Comment: Performed at [facility], [address] [place], [place] [address]  Ethanol     Status: None   Collection Time: 01/03/25  7:00 PM  Result Value Ref Range   Alcohol, Ethyl (B) <15 <15 mg/dL    Comment: (NOTE) For medical purposes only. Performed at [facility], [address] [place], [place] [address]   CBG monitoring, ED     Status: None   Collection Time: 01/03/25  7:02 PM  Result Value Ref Range   Glucose-Capillary 90 70 - 99 mg/dL    Comment: Glucose reference range applies only to samples taken after fasting for at least 8 hours.  I-stat chem 8, ED     Status: Abnormal   Collection Time: 01/03/25  7:36 PM  Result Value Ref Range   Sodium 137 135 - 145 mmol/L   Potassium 8.0 (HH) 3.5 - 5.1 mmol/L   Chloride 105 98 - 111 mmol/L   BUN 90 (H) 8 - 23 mg/dL   Creatinine, Ser >81.99 (H) 0.61 - 1.24 mg/dL   Glucose, Bld 77 70 - 99 mg/dL    Comment: Glucose reference range applies only to samples taken after fasting for at least 8 hours.   Calcium , Ion 0.81 (LL) 1.15 - 1.40 mmol/L   TCO2 24 22 - 32 mmol/L   Hemoglobin 12.9 (L) 13.0 - 17.0 g/dL   HCT 61.9 (L) 60.9 - 47.9 %   Comment NOTIFIED PHYSICIAN   CBG monitoring, ED     Status: None   Collection Time: 01/03/25  8:26 PM  Result Value Ref  Range   Glucose-Capillary 88 70 - 99 mg/dL    Comment: Glucose reference range applies only to samples  taken after fasting for at least 8 hours.  CBG monitoring, ED     Status: Abnormal   Collection Time: 01/03/25  9:08 PM  Result Value Ref Range   Glucose-Capillary 34 (LL) 70 - 99 mg/dL    Comment: Glucose reference range applies only to samples taken after fasting for at least 8 hours.  Hepatitis B surface antigen     Status: None   Collection Time: 01/03/25  9:10 PM  Result Value Ref Range   Hepatitis B Surface Ag NON REACTIVE NON REACTIVE    Comment: Performed at [facility], [address], [place], [place] [address]  CBG monitoring, ED     Status: None   Collection Time: 01/03/25  9:23 PM  Result Value Ref Range   Glucose-Capillary 99 70 - 99 mg/dL    Comment: Glucose reference range applies only to samples taken after fasting for at least 8 hours.  Glucose, capillary     Status: Abnormal   Collection Time: 01/04/25  2:40 AM  Result Value Ref Range   Glucose-Capillary 133 (H) 70 - 99 mg/dL    Comment: Glucose reference range applies only to samples taken after fasting for at least 8 hours.  Glucose, capillary     Status: Abnormal   Collection Time: 01/04/25  6:19 AM  Result Value Ref Range   Glucose-Capillary 152 (H) 70 - 99 mg/dL    Comment: Glucose reference range applies only to samples taken after fasting for at least 8 hours.   Comment 1 Notify RN    Comment 2 Document in Chart   Comprehensive metabolic panel     Status: Abnormal   Collection Time: 01/04/25  6:41 AM  Result Value Ref Range   Sodium 134 (L) 135 - 145 mmol/L   Potassium 5.7 (H) 3.5 - 5.1 mmol/L   Chloride 95 (L) 98 - 111 mmol/L   CO2 22 22 - 32 mmol/L   Glucose, Bld 155 (H) 70 - 99 mg/dL    Comment: Glucose reference range applies only to samples taken after fasting for at least 8 hours.   BUN 48 (H) 8 - 23 mg/dL   Creatinine, Ser 88.69 (H) 0.61 - 1.24 mg/dL   Calcium  7.7 (L) 8.9 - 10.3 mg/dL    Total Protein 7.4 6.5 - 8.1 g/dL   Albumin 3.4 (L) 3.5 - 5.0 g/dL   AST 18 15 - 41 U/L    Comment: HEMOLYSIS AT THIS LEVEL MAY AFFECT RESULT   ALT <5 0 - 44 U/L   Alkaline Phosphatase 106 38 - 126 U/L   Total Bilirubin 0.5 0.0 - 1.2 mg/dL   GFR, Estimated 5 (L) >60 mL/min    Comment: (NOTE) Calculated using the CKD-EPI Creatinine Equation (2021)    Anion gap 17 (H) 5 - 15    Comment: Performed at [facility], [address], [place], [place] [address]  CBC     Status: Abnormal   Collection Time: 01/04/25  6:41 AM  Result Value Ref Range   WBC 4.7 4.0 - 10.5 K/uL   RBC 3.41 (L) 4.22 - 5.81 MIL/uL   Hemoglobin 10.5 (L) 13.0 - 17.0 g/dL   HCT 67.6 (L) 60.9 - 47.9 %   MCV 94.7 80.0 - 100.0 fL   MCH 30.8 26.0 - 34.0 pg   MCHC 32.5 30.0 - 36.0 g/dL   RDW 86.6 88.4 - 84.4 %   Platelets 181 150 - 400 K/uL   nRBC  0.0 0.0 - 0.2 %    Comment: Performed at [facility], [address], [place], [place] [address]   CT ANGIO HEAD NECK W WO CM (CODE STROKE) Result Date: 01/03/2025 CLINICAL DATA:  Initial evaluation for acute neuro deficit, stroke suspected. EXAM: CT ANGIOGRAPHY HEAD AND NECK WITH AND WITHOUT CONTRAST TECHNIQUE: Multidetector CT imaging of the head and neck was performed using the standard protocol during bolus administration of intravenous contrast. Multiplanar CT image reconstructions and MIPs were obtained to evaluate the vascular anatomy. Carotid stenosis measurements (when applicable) are obtained utilizing NASCET criteria, using the distal internal carotid diameter as the denominator. RADIATION DOSE REDUCTION: This exam was performed according to the departmental dose-optimization program which includes automated exposure control, adjustment of the mA and/or kV according to patient size and/or use of iterative reconstruction technique. CONTRAST:  75mL OMNIPAQUE  IOHEXOL  350 MG/ML SOLN COMPARISON:  CT from earlier the same day as well as prior MRA from 07/05/2021.  FINDINGS: CTA NECK FINDINGS Aortic arch: Visualized aortic arch within normal limits for caliber standard branch pattern. No stenosis about the origin the great vessels. Right carotid system: Right common and internal carotid arteries are patent without dissection. Mild atheromatous change about the right carotid bulb without hemodynamically significant stenosis. Left carotid system: Left common and internal carotid arteries are patent without dissection. Mild atheromatous change about the left carotid bulb without hemodynamically significant stenosis. Vertebral arteries: Both vertebral arteries arise from subclavian arteries. Vertebral arteries are patent without significant stenosis or dissection. 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 Degenerative osteoarthritic changes about the C1-2 articulations. No worrisome osseous lesions. Other neck: No other acute finding. Few scattered thyroid  nodules noted, largest of which measures 2 cm on the right. These have been previously evaluated by thyroid  ultrasound on 08/06/2011. Upper chest: No other acute finding. Review of the MIP images confirms the above findings CTA HEAD FINDINGS Anterior circulation: The carotid siphons with up to moderate stenoses bilaterally. A1 segments patent bilaterally. Normal anterior communicating complex. Anterior cerebral arteries patent without stenosis. Left M1 segment and distal left MCA branches are patent without significant stenosis or occlusion. Right M1 segment patent proximally. There is a proximal right M2 occlusion, superior division (series 4, image 97). Overall appearance is similar as compared to prior MRA from 2022, consistent with a chronic finding. Inferior division and its distal branches remain patent and perfused. Posterior circulation: Both V4 segments patent without significant stenosis. Right vertebral artery slightly dominant. Neither PICA well  seen. Short-segment fenestration at the vertebrobasilar junction noted. Basilar patent without stenosis. Superior cerebellar arteries patent bilaterally. Left PCA supplied via the basilar as well as a small left posterior communicating artery. Fetal type origin of the right PCA. Atheromatous irregularity about the left PCA with associated moderate left P2 stenosis (series 6, image 20). Right PCA patent without significant stenosis. Venous sinuses: Patent allowing for timing the contrast bolus. Anatomic variants: As above.  No aneurysm. Review of the MIP images confirms the above findings IMPRESSION: 1. Negative CTA for acute large vessel occlusion or other emergent finding. 2. Proximal right M2 occlusion, superior division. Overall appearance is similar as compared to prior MRA from 2022, consistent with a chronic finding. 3. Moderate left P2 stenosis. 4. Atheromatous change about the carotid siphons with up to moderate stenoses bilaterally. No hemodynamically significant stenosis within the neck. Electronically Signed   By: Morene Hoard M.D.   On: 01/03/2025 20:16   CT HEAD  CODE STROKE WO CONTRAST Result Date: 01/03/2025 CLINICAL DATA:  Code stroke. Initial evaluation for acute neuro deficit, stroke suspected. EXAM: CT HEAD WITHOUT CONTRAST TECHNIQUE: Contiguous axial images were obtained from the base of the skull through the vertex without intravenous contrast. RADIATION DOSE REDUCTION: This exam was performed according to the departmental dose-optimization program which includes automated exposure control, adjustment of the mA and/or kV according to patient size and/or use of iterative reconstruction technique. COMPARISON:  Comparison made with prior head CT from 03/18/2022. FINDINGS: Brain: Cerebral volume within normal limits. Encephalomalacia involving the right temporoparietal region, consistent with a chronic right MCA distribution infarct, stable. No acute intracranial hemorrhage. No acute large  vessel territory infarct. No mass lesion or midline shift. No hydrocephalus or extra-axial fluid collection. Vascular: No abnormal hyperdense vessel. Skull: Scalp soft tissues within normal limits for calvarium intact. Sinuses/Orbits: Globes orbital soft tissues within normal limits. Paranasal sinuses and mastoid air cells are largely clear. Other: None. ASPECTS [facility] Stroke Program Early CT Score) - Ganglionic level infarction (caudate, lentiform nuclei, internal capsule, insula, M1-M3 cortex): 7 - Supraganglionic infarction (M4-M6 cortex): 3 Total score (0-10 with 10 being normal): 10 IMPRESSION: 1. No acute intracranial abnormality. 2. ASPECTS is 10. 3. Chronic right MCA distribution infarct. These results were communicated to Dr. Lindzen at 7:07 pm on 01/03/2025 by text page via the [facility] messaging system. Electronically Signed   By: Morene Hoard M.D.   On: 01/03/2025 19:09    PMH:   Past Medical History:  Diagnosis Date   Acute ischemic stroke (HCC) 11/2013   Allergy    Anemia    hx   Anxiety    Arthritis    Autism spectrum    high functioning per SO ib 06/11/24   Benign hypertension with CKD (chronic kidney disease) stage III (HCC)    Dialysis T T S   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 ischemic stroke   GERD (gastroesophageal reflux disease)    Hiatal hernia    Hyperlipidemia    Hypertension    Seizures (HCC)    none since 2016, taking Keppra  - maw   Sleep apnea    does not use CPAP   Thyroid  disease    weak thyroid  per MD   Wears  glasses     PSH:   Past Surgical History:  Procedure Laterality Date   A/V SHUNT INTERVENTION Left 06/03/2024   Procedure: A/V SHUNT INTERVENTION;  Surgeon: Magda Debby SAILOR, MD;  Location: [facility];  Service: Cardiovascular;  Laterality: Left;   AMPUTATION Right 12/27/2024   Procedure: RIGHT FIFTH RAY AMPUTATION;  Surgeon: Harden Jerona GAILS, MD;  Location: [facility] OR;  Service: Orthopedics;  Laterality: Right;   AV FISTULA PLACEMENT Left 10/14/2022   Procedure: LEFT BRACHIOCAPHALIC FISTULA CREATION;  Surgeon: Gretta Lonni PARAS, MD;  Location: [facility] OR;  Service: Vascular;  Laterality: Left;   BREAST BIOPSY Left 03/24/2023   US  LT BREAST BX W LOC DEV  1ST LESION IMG BX SPEC US  GUIDE 03/24/2023 [facility] MAMMOGRAPHY   COLONOSCOPY     DIALYSIS/PERMA CATHETER INSERTION N/A 04/08/2024   Procedure: DIALYSIS/PERMA CATHETER INSERTION;  Surgeon: Marea Selinda RAMAN, MD;  Location: [facility] INVASIVE [facility] LAB;  Service: Cardiovascular;  Laterality: N/A;   IR DIALY SHUNT INTRO NEEDLE/INTRACATH INITIAL W/IMG LEFT Left 03/29/2024   IR FLUORO GUIDE CV LINE RIGHT  10/06/2022   IR RADIOLOGIST EVAL & MGMT  04/04/2017   IR US  GUIDE VASC ACCESS RIGHT  10/06/2022   KIDNEY SURGERY     ablation of renal cell CA - 12/28, prior one was October 2012-Dr. Yamagata   MRI  11/2021   brain   REVISON OF ARTERIOVENOUS FISTULA Left 06/12/2024   Procedure: REVISON OF ARTERIOVENOUS FISTULA;  Surgeon: Magda Debby SAILOR, MD;  Location: [facility] OR;  Service: Vascular;  Laterality: Left;   TEE WITHOUT CARDIOVERSION N/A 11/19/2013   Procedure: TRANSESOPHAGEAL ECHOCARDIOGRAM (TEE);  Surgeon: Redell RAMAN Shallow, MD;  Location: [facility] ENDOSCOPY;  Service: Cardiovascular;  Laterality: N/A;   TEMPORARY DIALYSIS CATHETER N/A 04/04/2024   Procedure: TEMPORARY DIALYSIS CATHETER;  Surgeon: Marea Selinda RAMAN, MD;  Location: [facility] INVASIVE [facility] LAB;  Service: Cardiovascular;  Laterality: N/A;   TUNNELLED CATHETER EXCHANGE N/A 10/11/2024   Procedure: TUNNELLED CATHETER EXCHANGE;   Surgeon: Serene Gaile ORN, MD;  Location: [facility];  Service: Cardiovascular;  Laterality: N/A;    Allergies: Allergies[1]  Medications:   Prior to Admission medications  Medication Sig Start Date End Date Taking? Authorizing Provider  amLODipine  (NORVASC ) 10 MG tablet Take 1 tablet (10 mg total) by mouth daily. 04/08/24  Yes Dezii, Alexandra, DO  Calcium  Acetate 667 MG TABS Take 1,334 mg by mouth 3 (three) times daily with meals.   Yes [provider]  insulin  aspart (NOVOLOG  FLEXPEN) 100 UNIT/ML FlexPen Inject 0-25 Units into the skin 3 (three) times daily as needed (blood sugar > 200). 05/23/24  Yes Chandra Toribio POUR, MD  insulin  glargine (LANTUS ) 100 UNIT/ML injection Inject 25 Units into the skin 2 (two) times daily.   Yes [provider]  oxyCODONE -acetaminophen  (PERCOCET/ROXICET) 5-325 MG tablet Take 1 tablet by mouth every 4 (four) hours as needed. 12/28/24  Yes Harden Jerona GAILS, MD  sertraline  (ZOLOFT ) 100 MG tablet TAKE 1 TABLET BY MOUTH EVERY DAY 11/30/24  Yes Chandra Toribio POUR, MD  mupirocin  cream (BACTROBAN ) 2 % Apply 1 Application topically 2 (two) times daily. Patient not taking: Reported on 01/03/2025 05/23/24   Chandra Toribio POUR, MD  tadalafil (CIALIS) 20 MG tablet Take 20 mg by mouth daily as needed. Patient not taking: Reported on 12/26/2024 09/27/24   [provider]    Discontinued Meds:   Medications Discontinued During This Encounter  Medication Reason   Glucose Blood (BLOOD GLUCOSE TEST STRIPS) STRP Patient Preference   Influenza Vac Subunit Quad (FLUCELVAX QUADRIVALENT IM) Patient Preference   Blood Glucose Monitoring Suppl DEVI Patient Preference   Lancet Device MISC Patient Preference   Lancets (ACCU-CHEK SOFT TOUCH) lancets Patient Preference   Lancets Misc. MISC Patient Preference   sodium zirconium cyclosilicate  (LOKELMA ) packet 10 g     Social History:  reports that he has never smoked. He has never been exposed to tobacco smoke. He has  never used smokeless tobacco. He reports that he does not currently use alcohol. He reports that he does not use drugs.  Family History:   Family History  Problem Relation Age of Onset   Pneumonia Mother    Breast  cancer Mother    Diabetes Father    Heart disease Father    Breast cancer Sister    Prostate cancer Other    Stomach cancer Other    Colon cancer Neg Hx    Esophageal cancer Neg Hx    Rectal cancer Neg Hx     Blood pressure 134/80, pulse 85, temperature 99.1 F (37.3 C), temperature source Oral, resp. rate 17, height 6' 1 (1.854 m), weight 104.4 kg, SpO2 99%. Physical Exam: Gen alert, no distress Sclera anicteric, throat clear  No jvd or bruits Chest clear bilat to bases RRR no MRG Abd soft ntnd no mass or ascites +bs Ext no LE or UE edema, no other edema Neuro is alert, Ox 3 , nf LIJ TDC intact + Lt BCF + bruit     Roderica Cathell, LYNWOOD ORN, MD 01/04/2025, 11:46 AM      [1]  Allergies Allergen Reactions   Apresoline  [Hydralazine ] Other (See Comments)    Chest pain if by mouth- can tolerate it via IV   Imdur  [Isosorbide  Nitrate] Other (See Comments)    Headaches   Ms Contin  [Morphine ] Itching

## 2025-01-04 NOTE — Progress Notes (Signed)
" °   01/04/25 0217  Vitals  Temp 97.9 F (36.6 C)  Pulse Rate 85  Resp 14  BP 139/88  SpO2 100 %  O2 Device Room Air  Oxygen Therapy  Patient Activity (if Appropriate) Other (Comment) (Stretcher)  Pulse Oximetry Type Continuous  Oximetry Probe Site Changed No  Post Treatment  Dialyzer Clearance Clear  Liters Processed 84  Fluid Removed (mL) 2400 mL  Tolerated HD Treatment Yes  Post-Hemodialysis Comments Patient tolerated HD well. Ran on 1K+ bath x2 hours then remainder of treatment on 2K+ bath. Post HD FSBS 133. Patient denied complaints.    "

## 2025-01-04 NOTE — Progress Notes (Signed)
 NEUROLOGY CONSULT FOLLOW UP NOTE   Date of service: January 04, 2025 Patient Name: NAYTHEN HEIKKILA MRN:  [id number] DOB:  [birth year]  Interval Hx/subjective   Patient has remained hemodynamically stable and afebrile overnight.  He has undergone hemodialysis.  He reports that he is feeling much better and that his right-sided weakness has resolved.  He did have an episode of hypoglycemia overnight, but this has resolved. Vitals   Vitals:   01/04/25 0217 01/04/25 0334 01/04/25 0338 01/04/25 0819  BP: 139/88 (!) 164/79 (!) 164/79 134/80  Pulse: 85 87 87 85  Resp: 14 20  17   Temp: 97.9 F (36.6 C) 97.8 F (36.6 C) 97.8 F (36.6 C) 99.1 F (37.3 C)  TempSrc:  Oral Oral Oral  SpO2: 100% 100% 100% 99%  Weight:  104.4 kg    Height:  6' 1 (1.854 m)       Body mass index is 30.37 kg/m.  Physical Exam   Constitutional: Appears well-developed and well-nourished.  Psych: Affect appropriate to situation.  Eyes: No scleral injection.  HENT: No OP obstrucion.  Head: Normocephalic.  Respiratory: Effort normal, non-labored breathing.  Skin: WDI.   Neurologic Examination    NEURO:  Mental Status: AA&Ox3, able to give history of present illness Speech/Language: speech is without dysarthria or aphasia.    Cranial Nerves:  II: PERRL.  III, IV, VI: EOMI. Eyelids elevate symmetrically.  VII: Face is symmetrical resting and speaking VIII: hearing intact to voice. IX, X: Phonation is normal.  Motor: Able to move all 4 extremities with antigravity strength Tone: is normal and bulk is normal Sensation- Intact to light touch bilaterally.  Gait- deferred   Medications Current Medications[1]  Labs and Diagnostic Imaging   CBC:  Recent Labs  Lab 01/03/25 1900 01/03/25 1936 01/04/25 0641  WBC 6.6  --  4.7  NEUTROABS 4.3  --   --   HGB 12.1* 12.9* 10.5*  HCT 37.7* 38.0* 32.3*  MCV 96.9  --  94.7  PLT 244  --  181    Basic Metabolic Panel:  Lab Results  Component Value  Date   NA 134 (L) 01/04/2025   K 5.7 (H) 01/04/2025   CO2 22 01/04/2025   GLUCOSE 155 (H) 01/04/2025   BUN 48 (H) 01/04/2025   CREATININE 11.30 (H) 01/04/2025   CALCIUM  7.7 (L) 01/04/2025   GFRNONAA 5 (L) 01/04/2025   GFRAA 44 (L) 09/18/2020   Lipid Panel:  Lab Results  Component Value Date   LDLCALC 89 09/06/2023   HgbA1c:  Lab Results  Component Value Date   HGBA1C 7.0 07/26/2024   Urine Drug Screen:     Component Value Date/Time   LABOPIA NONE DETECTED 12/02/2021 1806   COCAINSCRNUR NONE DETECTED 12/02/2021 1806   LABBENZ NONE DETECTED 12/02/2021 1806   AMPHETMU NONE DETECTED 12/02/2021 1806   THCU NONE DETECTED 12/02/2021 1806   LABBARB NONE DETECTED 12/02/2021 1806    Alcohol Level     Component Value Date/Time   ETH <15 01/03/2025 1900   INR  Lab Results  Component Value Date   INR 1.3 (H) 01/03/2025   APTT  Lab Results  Component Value Date   APTT 40 (H) 01/03/2025   AED levels:  Lab Results  Component Value Date   LEVETIRACETA <1.0 (L) 07/04/2021    CT Head without contrast(Personally reviewed): No acute abnormality, chronic right MCA distribution infarct  CT angio Head and Neck with contrast(Personally reviewed): No acute LVO, proximal right M2  occlusion  MRI Brain(Personally reviewed): Pending  Assessment   IMANI SHERRIN is a 64 y.o. 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  He had missed several dialysis sessions and yesterday, when he got in the car to go to a doctor's appointment, he noted right sided weakness and was unable to get out of the car.  Eventually, he got a friend to bring him to the emergency department, where he was found to be quite hyperkalemic with a potassium of 8.  He was emergently dialyzed with  resolution of his electrolyte abnormalities.  Patient now reports resolution of his right-sided weakness.  Suspect that weakness was due to toxic metabolic encephalopathy in the setting of electrolyte derangements from missed dialysis sessions, but will obtain MRI brain to determine if patient has had another stroke.  Recommendations  -MRI brain, commence stroke workup if positive - Continue dialysis per nephrology - Neurology will continue to follow  If the MRI is negative, no further inpatient workup from neurology at this time. Plan was relayed to Dr. Verdene  ______________________________________________________________________ Patient seen by NP with MD, MD to edit note as needed.  Signed, Cortney E Everitt Clint Kill, NP [facility]    Attending Neurohospitalist Addendum Patient seen and examined with APP/Resident. Agree with the history and physical as documented above. Agree with the plan as documented, which I helped formulate. I have independently reviewed the chart, obtained history, review of systems and examined the patient.I have personally reviewed pertinent head/neck/spine imaging (CT/MRI). Please feel free to call with any questions.  -- Eligio Lav, MD Neurologist [facility] Pager: [phone]      [1]  Current Facility-Administered Medications:    acetaminophen  (TYLENOL ) tablet 650 mg, 650 mg, Oral, Q6H PRN, 650 mg at 01/04/25 0932 **OR** acetaminophen  (TYLENOL ) suppository 650 mg, 650 mg, Rectal, Q6H PRN, Freddi Hamilton, MD   aspirin  EC tablet 81 mg, 81 mg, Oral, Daily, Krishnan, Gokul, MD   calcium  acetate (PHOSLO ) capsule 1,334 mg, 1,334 mg, Oral, TID WC, Krishnan, Gokul, MD   Chlorhexidine  Gluconate Cloth 2 % PADS 6 each, 6 each, Topical, Q0600, Melia Lynwood LELON, MD   diphenhydrAMINE  (BENADRYL ) capsule 25 mg, 25 mg, Oral, Q6H PRN, Mansy, Jan A, MD   feeding supplement (NEPRO CARB STEADY) liquid 237 mL, 237 mL, Oral, TID BM, Claiborne,  Claudia, MD, 237 mL at 01/04/25 0933   heparin  injection 5,000 Units, 5,000 Units, Subcutaneous, Q8H, Freddi Hamilton, MD, 5,000 Units at 01/04/25 9376   heparin  sodium (porcine) injection 1,000 Units, 1,000 Units, Intracatheter, Q dialysis, Melia Lynwood LELON, MD, 4,200 Units at 01/04/25 0223   insulin  aspart (novoLOG ) injection 0-5 Units, 0-5 Units, Subcutaneous, QHS, Mansy, Jan A, MD   insulin  aspart (novoLOG ) injection 0-9 Units, 0-9 Units, Subcutaneous, TID WC, Mansy, Madison LABOR, MD, 2 Units at 01/04/25 9376   oxyCODONE  (Oxy IR/ROXICODONE ) immediate release tablet 5 mg, 5 mg, Oral, Q6H PRN, Mansy, Jan A, MD, 5 mg at 01/04/25 9572   sertraline  (ZOLOFT ) tablet 100 mg, 100 mg, Oral, Daily, Krishnan, Gokul, MD   sodium zirconium cyclosilicate  (LOKELMA ) packet 10 g, 10 g, Oral, Once, Krishnan, Gokul, MD

## 2025-01-05 ENCOUNTER — Encounter: Payer: Self-pay | Admitting: Physician Assistant

## 2025-01-05 DIAGNOSIS — N186 End stage renal disease: Secondary | ICD-10-CM | POA: Diagnosis not present

## 2025-01-05 DIAGNOSIS — E875 Hyperkalemia: Secondary | ICD-10-CM | POA: Diagnosis not present

## 2025-01-05 LAB — BASIC METABOLIC PANEL WITH GFR
Anion gap: 16 — ABNORMAL HIGH (ref 5–15)
BUN: 55 mg/dL — ABNORMAL HIGH (ref 8–23)
CO2: 25 mmol/L (ref 22–32)
Calcium: 7.6 mg/dL — ABNORMAL LOW (ref 8.9–10.3)
Chloride: 97 mmol/L — ABNORMAL LOW (ref 98–111)
Creatinine, Ser: 12.8 mg/dL — ABNORMAL HIGH (ref 0.61–1.24)
GFR, Estimated: 4 mL/min — ABNORMAL LOW
Glucose, Bld: 78 mg/dL (ref 70–99)
Potassium: 6.2 mmol/L — ABNORMAL HIGH (ref 3.5–5.1)
Sodium: 138 mmol/L (ref 135–145)

## 2025-01-05 LAB — CBC
HCT: 29.7 % — ABNORMAL LOW (ref 39.0–52.0)
Hemoglobin: 9.8 g/dL — ABNORMAL LOW (ref 13.0–17.0)
MCH: 30.7 pg (ref 26.0–34.0)
MCHC: 33 g/dL (ref 30.0–36.0)
MCV: 93.1 fL (ref 80.0–100.0)
Platelets: 193 10*3/uL (ref 150–400)
RBC: 3.19 MIL/uL — ABNORMAL LOW (ref 4.22–5.81)
RDW: 13.2 % (ref 11.5–15.5)
WBC: 6.4 10*3/uL (ref 4.0–10.5)
nRBC: 0 % (ref 0.0–0.2)

## 2025-01-05 LAB — LIPID PANEL
Cholesterol: 114 mg/dL (ref 0–200)
HDL: 36 mg/dL — ABNORMAL LOW
LDL Cholesterol: 68 mg/dL (ref 0–99)
Total CHOL/HDL Ratio: 3.2 ratio
Triglycerides: 50 mg/dL
VLDL: 10 mg/dL (ref 0–40)

## 2025-01-05 LAB — GLUCOSE, CAPILLARY
Glucose-Capillary: 89 mg/dL (ref 70–99)
Glucose-Capillary: 143 mg/dL — ABNORMAL HIGH (ref 70–99)
Glucose-Capillary: 86 mg/dL (ref 70–99)

## 2025-01-05 MED ORDER — SODIUM ZIRCONIUM CYCLOSILICATE 10 G PO PACK
10.0000 g | PACK | ORAL | Status: AC
  Administered 2025-01-05 (×2): 10 g via ORAL
  Filled 2025-01-05 (×2): qty 1

## 2025-01-05 MED ORDER — HEPARIN SODIUM (PORCINE) 1000 UNIT/ML DIALYSIS
2000.0000 [IU] | Freq: Once | INTRAMUSCULAR | Status: AC
  Administered 2025-01-05: 2000 [IU] via INTRAVENOUS_CENTRAL
  Filled 2025-01-05: qty 2

## 2025-01-05 MED ORDER — HEPARIN SODIUM (PORCINE) 1000 UNIT/ML IJ SOLN
INTRAMUSCULAR | Status: AC
  Filled 2025-01-05: qty 4

## 2025-01-05 MED ORDER — HEPARIN SODIUM (PORCINE) 1000 UNIT/ML IJ SOLN
INTRAMUSCULAR | Status: AC
  Filled 2025-01-05: qty 2

## 2025-01-05 NOTE — Progress Notes (Signed)
" °   01/05/25 1434  Vitals  Temp 98.6 F (37 C)  Pulse Rate 85  Resp 16  BP (!) 158/97  SpO2 97 %  O2 Device Room Air  Type of Weight Post-Dialysis  Oxygen Therapy  Patient Activity (if Appropriate) In bed  Pulse Oximetry Type Continuous  Oximetry Probe Site Changed No  Post Treatment  Dialyzer Clearance Lightly streaked  Hemodialysis Intake (mL) 0 mL  Liters Processed 72  Fluid Removed (mL) 1000 mL  Tolerated HD Treatment Yes  AVG/AVF Arterial Site Held (minutes) 10 minutes  AVG/AVF Venous Site Held (minutes) 10 minutes   Received patient in bed to unit.  Alert and oriented.  Informed consent signed and in chart.   TX duration:3.0  Patient tolerated well.  Transported back to the room  Alert, without acute distress.  Hand-off given to patient's nurse.   Access used: LIJDLC and a left UAG Access issues: no complications  Total UF removed: 1000 Medication(s) given: none   [name] "

## 2025-01-05 NOTE — Progress Notes (Signed)
 Patient returned from dialysis.

## 2025-01-06 ENCOUNTER — Other Ambulatory Visit (HOSPITAL_COMMUNITY): Payer: Self-pay

## 2025-01-06 ENCOUNTER — Encounter (HOSPITAL_COMMUNITY): Payer: Self-pay

## 2025-01-06 DIAGNOSIS — E875 Hyperkalemia: Secondary | ICD-10-CM | POA: Diagnosis not present

## 2025-01-06 LAB — BASIC METABOLIC PANEL WITH GFR
Anion gap: 12 (ref 5–15)
BUN: 37 mg/dL — ABNORMAL HIGH (ref 8–23)
CO2: 31 mmol/L (ref 22–32)
Calcium: 8.7 mg/dL — ABNORMAL LOW (ref 8.9–10.3)
Chloride: 95 mmol/L — ABNORMAL LOW (ref 98–111)
Creatinine, Ser: 9.61 mg/dL — ABNORMAL HIGH (ref 0.61–1.24)
GFR, Estimated: 6 mL/min — ABNORMAL LOW
Glucose, Bld: 103 mg/dL — ABNORMAL HIGH (ref 70–99)
Potassium: 5.2 mmol/L — ABNORMAL HIGH (ref 3.5–5.1)
Sodium: 137 mmol/L (ref 135–145)

## 2025-01-06 LAB — HEPATITIS B SURFACE ANTIBODY, QUANTITATIVE: Hep B S AB Quant (Post): 69 m[IU]/mL

## 2025-01-06 LAB — GLUCOSE, CAPILLARY: Glucose-Capillary: 81 mg/dL (ref 70–99)

## 2025-01-06 MED ORDER — ASPIRIN 81 MG PO TBEC
81.0000 mg | DELAYED_RELEASE_TABLET | Freq: Every day | ORAL | 0 refills | Status: AC
  Filled 2025-01-06: qty 90, 90d supply, fill #0

## 2025-01-06 NOTE — TOC Transition Note (Signed)
 Transition of Care [facility]) - Discharge Note   Patient Details  Name: Brandon [name] MRN: [id number] Date of Birth: Jan 18, 1961  [facility]) CM/SW Contact:  Andrez JULIANNA George, RN Phone Number: 01/06/2025, 10:51 AM   Clinical Narrative:     Pt is discharging home with home health through [place]. Information on the AVS.  Pt states walker for home is at the bedside.  ICM counseled pt on importance of going to his HD treatments.  Pt states his SO, Olam will provide transportation home.  Final next level of care: Home w Home Health Services Barriers to Discharge: No Barriers Identified   Patient Goals and CMS Choice   CMS Medicare.gov Compare Post Acute Care list provided to:: Patient Choice offered to / list presented to : Patient      Discharge Placement                       Discharge Plan and Services Additional resources added to the After Visit Summary for                            [facility] Arranged: RN, PT [facility] Agency: [facility] Care Date [facility] Agency Contacted: 01/06/25   Representative spoke with at [facility] Agency: Darleene  Social Drivers of Health (SDOH) Interventions SDOH Screenings   Food Insecurity: No Food Insecurity (01/04/2025)  Housing: Low Risk (01/04/2025)  Transportation Needs: No Transportation Needs (01/04/2025)  Utilities: Not At Risk (01/04/2025)  Alcohol Screen: Low Risk (08/22/2024)  Depression (PHQ2-9): Low Risk (08/22/2024)  Recent Concern: Depression (PHQ2-9) - Medium Risk (07/26/2024)  Financial Resource Strain: Low Risk (08/22/2024)  Physical Activity: Insufficiently Active (08/22/2024)  Social Connections: Moderately Isolated (12/27/2024)  Stress: No Stress Concern Present (08/22/2024)  Tobacco Use: Low Risk (01/05/2025)  Health Literacy: Adequate Health Literacy (08/22/2024)     Readmission Risk Interventions     No data to display

## 2025-01-06 NOTE — Progress Notes (Signed)
 Patient for discharge today.  IV and telemetry discontinued,  vital sign unremarkable.  AVS discussed.  Patient  denies any discomfort. Friend will provide ride home.

## 2025-01-08 ENCOUNTER — Telehealth: Payer: Self-pay

## 2025-01-08 NOTE — Transitions of Care (Post Inpatient/ED Visit) (Signed)
" ° °  01/08/2025  Name: Brandon [name] MRN: [id number] DOB: 15-Jun-1961  Today's TOC FU Call Status: Today's TOC FU Call Status:: Unsuccessful Call (1st Attempt) Unsuccessful Call (1st Attempt) Date: 01/08/25  Attempted to reach the patient regarding the most recent Inpatient/ED visit.  Follow Up Plan: Additional outreach attempts will be made to reach the patient to complete the Transitions of Care (Post Inpatient/ED visit) call.   Shona Prow RN, CCM [place]  VBCI-Population Health RN Care Manager [phone]  "

## 2025-01-09 ENCOUNTER — Telehealth: Payer: Self-pay

## 2025-01-09 NOTE — Transitions of Care (Post Inpatient/ED Visit) (Signed)
" ° °  01/09/2025  Name: Brandon [name] MRN: [id number] DOB: 12/02/1961  Today's TOC FU Call Status: Today's TOC FU Call Status:: Unsuccessful Call (2nd Attempt) Unsuccessful Call (2nd Attempt) Date: 01/09/25  Attempted to reach the patient regarding the most recent Inpatient/ED visit.  Follow Up Plan: Additional outreach attempts will be made to reach the patient to complete the Transitions of Care (Post Inpatient/ED visit) call.   Shona Prow RN, CCM [place]  VBCI-Population Health RN Care Manager [phone]  "

## 2025-01-09 NOTE — Transitions of Care (Post Inpatient/ED Visit) (Signed)
" ° °  01/09/2025  Name: Brandon [name] MRN: [id number] DOB: 1961/07/29  Today's TOC FU Call Status: Today's TOC FU Call Status:: Successful TOC FU Call Completed TOC FU Call Complete Date: 01/09/25 (Received VM from patient - called back. Spoke briefly with patient and explained TOC program - patient states, My plate is kinda full right now and declined TOC program)  Patient's Name and Date of Birth confirmed. DOB, Name  Shona Prow RN, CCM   VBCI-Population Health RN Care Manager [phone]  "

## 2025-01-15 ENCOUNTER — Ambulatory Visit: Admitting: Family Medicine

## 2025-01-17 ENCOUNTER — Encounter: Admitting: Physician Assistant

## 2025-10-16 ENCOUNTER — Ambulatory Visit
